# Patient Record
Sex: Male | Born: 1945 | Race: White | Hispanic: No | Marital: Married | Smoking: Former smoker
Health system: Southern US, Community
[De-identification: ages and names within clinical notes are randomized; demographics above are authoritative.]

## PROBLEM LIST (undated history)

## (undated) DIAGNOSIS — G4730 Sleep apnea, unspecified: Secondary | ICD-10-CM

## (undated) DIAGNOSIS — T7840XA Allergy, unspecified, initial encounter: Secondary | ICD-10-CM

## (undated) DIAGNOSIS — G4733 Obstructive sleep apnea (adult) (pediatric): Secondary | ICD-10-CM

## (undated) DIAGNOSIS — I2510 Atherosclerotic heart disease of native coronary artery without angina pectoris: Secondary | ICD-10-CM

## (undated) DIAGNOSIS — E785 Hyperlipidemia, unspecified: Secondary | ICD-10-CM

## (undated) DIAGNOSIS — Z860100 Personal history of colon polyps, unspecified: Secondary | ICD-10-CM

## (undated) DIAGNOSIS — K219 Gastro-esophageal reflux disease without esophagitis: Secondary | ICD-10-CM

## (undated) DIAGNOSIS — K5900 Constipation, unspecified: Secondary | ICD-10-CM

## (undated) DIAGNOSIS — G8929 Other chronic pain: Secondary | ICD-10-CM

## (undated) DIAGNOSIS — R7303 Prediabetes: Secondary | ICD-10-CM

## (undated) DIAGNOSIS — H269 Unspecified cataract: Secondary | ICD-10-CM

## (undated) DIAGNOSIS — I499 Cardiac arrhythmia, unspecified: Secondary | ICD-10-CM

## (undated) DIAGNOSIS — N401 Enlarged prostate with lower urinary tract symptoms: Secondary | ICD-10-CM

## (undated) DIAGNOSIS — K319 Disease of stomach and duodenum, unspecified: Secondary | ICD-10-CM

## (undated) DIAGNOSIS — R011 Cardiac murmur, unspecified: Secondary | ICD-10-CM

## (undated) DIAGNOSIS — D225 Melanocytic nevi of trunk: Secondary | ICD-10-CM

## (undated) DIAGNOSIS — R42 Dizziness and giddiness: Secondary | ICD-10-CM

## (undated) DIAGNOSIS — C801 Malignant (primary) neoplasm, unspecified: Secondary | ICD-10-CM

## (undated) DIAGNOSIS — M1990 Unspecified osteoarthritis, unspecified site: Secondary | ICD-10-CM

## (undated) DIAGNOSIS — I209 Angina pectoris, unspecified: Secondary | ICD-10-CM

## (undated) DIAGNOSIS — Z95810 Presence of automatic (implantable) cardiac defibrillator: Secondary | ICD-10-CM

## (undated) DIAGNOSIS — I10 Essential (primary) hypertension: Secondary | ICD-10-CM

## (undated) DIAGNOSIS — K222 Esophageal obstruction: Secondary | ICD-10-CM

## (undated) DIAGNOSIS — R0602 Shortness of breath: Secondary | ICD-10-CM

## (undated) DIAGNOSIS — N138 Other obstructive and reflux uropathy: Secondary | ICD-10-CM

## (undated) DIAGNOSIS — J189 Pneumonia, unspecified organism: Secondary | ICD-10-CM

## (undated) DIAGNOSIS — K449 Diaphragmatic hernia without obstruction or gangrene: Secondary | ICD-10-CM

## (undated) DIAGNOSIS — I509 Heart failure, unspecified: Secondary | ICD-10-CM

## (undated) DIAGNOSIS — E291 Testicular hypofunction: Secondary | ICD-10-CM

## (undated) DIAGNOSIS — M549 Dorsalgia, unspecified: Secondary | ICD-10-CM

## (undated) DIAGNOSIS — C4491 Basal cell carcinoma of skin, unspecified: Secondary | ICD-10-CM

## (undated) HISTORY — DX: Hyperlipidemia, unspecified: E78.5

## (undated) HISTORY — DX: Atherosclerotic heart disease of native coronary artery without angina pectoris: I25.10

## (undated) HISTORY — DX: Disease of stomach and duodenum, unspecified: K31.9

## (undated) HISTORY — DX: Other obstructive and reflux uropathy: N13.8

## (undated) HISTORY — DX: Personal history of colonic polyps: Z86.010

## (undated) HISTORY — DX: Essential (primary) hypertension: I10

## (undated) HISTORY — DX: Gastro-esophageal reflux disease without esophagitis: K21.9

## (undated) HISTORY — PX: UPPER GASTROINTESTINAL ENDOSCOPY: SHX188

## (undated) HISTORY — PX: OTHER SURGICAL HISTORY: SHX169

## (undated) HISTORY — DX: Diaphragmatic hernia without obstruction or gangrene: K44.9

## (undated) HISTORY — DX: Testicular hypofunction: E29.1

## (undated) HISTORY — PX: POLYPECTOMY: SHX149

## (undated) HISTORY — DX: Cardiac murmur, unspecified: R01.1

## (undated) HISTORY — DX: Obstructive sleep apnea (adult) (pediatric): G47.33

## (undated) HISTORY — DX: Dizziness and giddiness: R42

## (undated) HISTORY — DX: Sleep apnea, unspecified: G47.30

## (undated) HISTORY — PX: CARDIAC CATHETERIZATION: SHX172

## (undated) HISTORY — DX: Heart failure, unspecified: I50.9

## (undated) HISTORY — DX: Benign prostatic hyperplasia with lower urinary tract symptoms: N40.1

## (undated) HISTORY — DX: Unspecified cataract: H26.9

## (undated) HISTORY — DX: Allergy, unspecified, initial encounter: T78.40XA

## (undated) HISTORY — DX: Other chronic pain: G89.29

## (undated) HISTORY — DX: Unspecified osteoarthritis, unspecified site: M19.90

## (undated) HISTORY — PX: KNEE ARTHROPLASTY: SHX992

## (undated) HISTORY — DX: Esophageal obstruction: K22.2

## (undated) HISTORY — DX: Personal history of colon polyps, unspecified: Z86.0100

## (undated) HISTORY — DX: Dorsalgia, unspecified: M54.9

---
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
## 2001-01-03 ENCOUNTER — Encounter: Payer: Self-pay | Admitting: Pulmonary Disease

## 2001-01-03 ENCOUNTER — Encounter: Admission: RE | Admit: 2001-01-03 | Discharge: 2001-01-03 | Payer: Self-pay | Admitting: Pulmonary Disease

## 2001-01-26 ENCOUNTER — Encounter: Admission: RE | Admit: 2001-01-26 | Discharge: 2001-04-26 | Payer: Self-pay | Admitting: Otolaryngology

## 2001-04-10 ENCOUNTER — Encounter: Payer: Self-pay | Admitting: Pulmonary Disease

## 2001-04-10 ENCOUNTER — Ambulatory Visit (HOSPITAL_BASED_OUTPATIENT_CLINIC_OR_DEPARTMENT_OTHER): Admission: RE | Admit: 2001-04-10 | Discharge: 2001-04-10 | Payer: Self-pay | Admitting: Pulmonary Disease

## 2001-11-24 ENCOUNTER — Encounter: Admission: RE | Admit: 2001-11-24 | Discharge: 2001-11-24 | Payer: Self-pay | Admitting: Neurology

## 2001-11-24 ENCOUNTER — Encounter: Payer: Self-pay | Admitting: Neurology

## 2003-04-27 DIAGNOSIS — D225 Melanocytic nevi of trunk: Secondary | ICD-10-CM

## 2003-04-27 HISTORY — DX: Melanocytic nevi of trunk: D22.5

## 2003-05-21 HISTORY — PX: OTHER SURGICAL HISTORY: SHX169

## 2003-08-03 ENCOUNTER — Other Ambulatory Visit: Payer: Self-pay

## 2003-08-11 ENCOUNTER — Other Ambulatory Visit: Payer: Self-pay

## 2003-08-12 ENCOUNTER — Other Ambulatory Visit: Payer: Self-pay

## 2003-08-19 HISTORY — PX: CORONARY ANGIOPLASTY: SHX604

## 2003-09-16 ENCOUNTER — Ambulatory Visit (HOSPITAL_COMMUNITY): Admission: RE | Admit: 2003-09-16 | Discharge: 2003-09-17 | Payer: Self-pay | Admitting: *Deleted

## 2003-10-04 ENCOUNTER — Ambulatory Visit (HOSPITAL_COMMUNITY): Admission: RE | Admit: 2003-10-04 | Discharge: 2003-10-04 | Payer: Self-pay | Admitting: Cardiology

## 2004-06-14 ENCOUNTER — Ambulatory Visit: Payer: Self-pay | Admitting: *Deleted

## 2004-06-25 ENCOUNTER — Ambulatory Visit: Payer: Self-pay | Admitting: *Deleted

## 2004-06-25 ENCOUNTER — Ambulatory Visit: Payer: Self-pay

## 2004-06-28 ENCOUNTER — Ambulatory Visit: Payer: Self-pay | Admitting: *Deleted

## 2004-07-06 ENCOUNTER — Ambulatory Visit: Payer: Self-pay

## 2004-07-13 ENCOUNTER — Ambulatory Visit: Payer: Self-pay | Admitting: *Deleted

## 2004-07-24 ENCOUNTER — Ambulatory Visit: Payer: Self-pay | Admitting: Internal Medicine

## 2004-07-24 ENCOUNTER — Ambulatory Visit (HOSPITAL_COMMUNITY): Admission: RE | Admit: 2004-07-24 | Discharge: 2004-07-24 | Payer: Self-pay | Admitting: *Deleted

## 2004-08-06 ENCOUNTER — Ambulatory Visit: Payer: Self-pay | Admitting: *Deleted

## 2004-08-10 ENCOUNTER — Ambulatory Visit: Payer: Self-pay | Admitting: *Deleted

## 2004-08-15 ENCOUNTER — Ambulatory Visit: Payer: Self-pay | Admitting: *Deleted

## 2004-08-15 ENCOUNTER — Ambulatory Visit (HOSPITAL_COMMUNITY): Admission: RE | Admit: 2004-08-15 | Discharge: 2004-08-15 | Payer: Self-pay | Admitting: *Deleted

## 2004-08-20 ENCOUNTER — Ambulatory Visit: Payer: Self-pay | Admitting: *Deleted

## 2004-09-03 ENCOUNTER — Encounter (HOSPITAL_COMMUNITY): Admission: RE | Admit: 2004-09-03 | Discharge: 2004-12-02 | Payer: Self-pay | Admitting: *Deleted

## 2005-02-05 ENCOUNTER — Ambulatory Visit: Payer: Self-pay | Admitting: Pulmonary Disease

## 2005-02-13 ENCOUNTER — Ambulatory Visit: Payer: Self-pay | Admitting: Cardiology

## 2005-02-18 ENCOUNTER — Ambulatory Visit: Payer: Self-pay | Admitting: Cardiology

## 2005-06-18 ENCOUNTER — Ambulatory Visit: Payer: Self-pay | Admitting: Pulmonary Disease

## 2005-07-29 ENCOUNTER — Ambulatory Visit: Payer: Self-pay | Admitting: Cardiology

## 2006-01-27 ENCOUNTER — Ambulatory Visit: Payer: Self-pay | Admitting: Cardiology

## 2006-02-05 ENCOUNTER — Ambulatory Visit: Payer: Self-pay | Admitting: Cardiology

## 2006-02-13 ENCOUNTER — Ambulatory Visit (HOSPITAL_COMMUNITY): Admission: RE | Admit: 2006-02-13 | Discharge: 2006-02-13 | Payer: Self-pay | Admitting: Orthopedic Surgery

## 2006-07-22 ENCOUNTER — Ambulatory Visit: Payer: Self-pay | Admitting: Cardiology

## 2006-09-25 ENCOUNTER — Ambulatory Visit: Payer: Self-pay | Admitting: Gastroenterology

## 2006-10-27 ENCOUNTER — Ambulatory Visit: Payer: Self-pay | Admitting: Gastroenterology

## 2007-01-12 ENCOUNTER — Encounter (INDEPENDENT_AMBULATORY_CARE_PROVIDER_SITE_OTHER): Payer: Self-pay | Admitting: *Deleted

## 2007-01-22 ENCOUNTER — Ambulatory Visit: Payer: Self-pay | Admitting: Gastroenterology

## 2007-02-06 ENCOUNTER — Ambulatory Visit: Payer: Self-pay | Admitting: Sports Medicine

## 2007-02-06 DIAGNOSIS — I10 Essential (primary) hypertension: Secondary | ICD-10-CM | POA: Insufficient documentation

## 2007-02-06 DIAGNOSIS — I2510 Atherosclerotic heart disease of native coronary artery without angina pectoris: Secondary | ICD-10-CM | POA: Insufficient documentation

## 2007-02-06 DIAGNOSIS — M542 Cervicalgia: Secondary | ICD-10-CM | POA: Insufficient documentation

## 2007-02-06 DIAGNOSIS — M19042 Primary osteoarthritis, left hand: Secondary | ICD-10-CM

## 2007-02-06 DIAGNOSIS — M19041 Primary osteoarthritis, right hand: Secondary | ICD-10-CM | POA: Insufficient documentation

## 2007-02-06 DIAGNOSIS — E785 Hyperlipidemia, unspecified: Secondary | ICD-10-CM | POA: Insufficient documentation

## 2007-02-10 ENCOUNTER — Ambulatory Visit: Payer: Self-pay | Admitting: Cardiology

## 2007-02-12 ENCOUNTER — Ambulatory Visit: Payer: Self-pay | Admitting: Cardiology

## 2007-02-12 LAB — CONVERTED CEMR LAB
ALT: 22 units/L (ref 0–53)
AST: 26 units/L (ref 0–37)
Albumin: 4 g/dL (ref 3.5–5.2)
Bilirubin, Direct: 0.2 mg/dL (ref 0.0–0.3)
Calcium: 9.4 mg/dL (ref 8.4–10.5)
Chloride: 105 meq/L (ref 96–112)
GFR calc Af Amer: 126 mL/min
GFR calc non Af Amer: 104 mL/min
Hgb A1c MFr Bld: 5.7 % (ref 4.6–6.0)
LDL Cholesterol: 66 mg/dL (ref 0–99)
Total CHOL/HDL Ratio: 3.9
VLDL: 16 mg/dL (ref 0–40)

## 2007-04-08 ENCOUNTER — Telehealth (INDEPENDENT_AMBULATORY_CARE_PROVIDER_SITE_OTHER): Payer: Self-pay | Admitting: *Deleted

## 2007-04-10 ENCOUNTER — Ambulatory Visit: Payer: Self-pay | Admitting: Pulmonary Disease

## 2007-05-19 ENCOUNTER — Ambulatory Visit: Payer: Self-pay | Admitting: Pulmonary Disease

## 2007-05-24 DIAGNOSIS — J309 Allergic rhinitis, unspecified: Secondary | ICD-10-CM | POA: Insufficient documentation

## 2007-05-24 DIAGNOSIS — G4733 Obstructive sleep apnea (adult) (pediatric): Secondary | ICD-10-CM | POA: Insufficient documentation

## 2007-05-24 DIAGNOSIS — K222 Esophageal obstruction: Secondary | ICD-10-CM

## 2007-05-24 DIAGNOSIS — D126 Benign neoplasm of colon, unspecified: Secondary | ICD-10-CM

## 2007-05-24 DIAGNOSIS — K449 Diaphragmatic hernia without obstruction or gangrene: Secondary | ICD-10-CM | POA: Insufficient documentation

## 2007-05-24 LAB — CONVERTED CEMR LAB
Alkaline Phosphatase: 58 units/L (ref 39–117)
BUN: 14 mg/dL (ref 6–23)
CO2: 31 meq/L (ref 19–32)
Calcium: 9.2 mg/dL (ref 8.4–10.5)
Eosinophils Absolute: 0.2 10*3/uL (ref 0.0–0.6)
Eosinophils Relative: 4 % (ref 0.0–5.0)
GFR calc Af Amer: 146 mL/min
GFR calc non Af Amer: 121 mL/min
HDL: 26.9 mg/dL — ABNORMAL LOW (ref >39.0)
Ketones, ur: NEGATIVE mg/dL
Leukocytes, UA: NEGATIVE
Lymphocytes Relative: 29.3 % (ref 12.0–46.0)
MCV: 88.9 fL (ref 78.0–100.0)
Monocytes Relative: 13.7 % — ABNORMAL HIGH (ref 3.0–11.0)
Neutro Abs: 2.4 10*3/uL (ref 1.4–7.7)
Platelets: 239 10*3/uL (ref 150–400)
Potassium: 3.7 meq/L (ref 3.5–5.1)
RBC: 4.27 M/uL (ref 4.22–5.81)
Specific Gravity, Urine: 1.02 (ref 1.000–1.03)
Squamous Epithelial / LPF: NEGATIVE /lpf
Total CHOL/HDL Ratio: 3.5
Total Protein: 6.6 g/dL (ref 6.0–8.3)
Triglycerides: 74 mg/dL (ref 0–149)
Urine Glucose: NEGATIVE mg/dL
Urobilinogen, UA: 0.2 (ref 0.0–1.0)
VLDL: 15 mg/dL (ref 0–40)
WBC: 4.5 10*3/uL (ref 4.5–10.5)
pH: 6 (ref 5.0–8.0)

## 2007-05-27 ENCOUNTER — Telehealth: Payer: Self-pay | Admitting: Pulmonary Disease

## 2007-06-02 ENCOUNTER — Telehealth: Payer: Self-pay | Admitting: Pulmonary Disease

## 2007-06-04 ENCOUNTER — Ambulatory Visit: Payer: Self-pay | Admitting: Gastroenterology

## 2007-06-04 LAB — CONVERTED CEMR LAB
A-1 Antitrypsin, Ser: 134 mg/dL (ref 83–200)
Ceruloplasmin: 29 mg/dL (ref 21–63)
Folate: >20 ng/mL
Iron: 69 ug/dL (ref 42–165)
Saturation Ratios: 18.8 % — ABNORMAL LOW (ref 20.0–50.0)
Vitamin B-12: 398 pg/mL (ref 211–911)

## 2007-06-11 ENCOUNTER — Ambulatory Visit (HOSPITAL_COMMUNITY): Admission: RE | Admit: 2007-06-11 | Discharge: 2007-06-11 | Payer: Self-pay | Admitting: Gastroenterology

## 2007-07-01 ENCOUNTER — Telehealth: Payer: Self-pay | Admitting: Pulmonary Disease

## 2007-07-02 ENCOUNTER — Ambulatory Visit: Payer: Self-pay | Admitting: Gastroenterology

## 2007-07-02 LAB — CONVERTED CEMR LAB
ALT: 17 units/L (ref 0–53)
AST: 19 units/L (ref 0–37)
Bilirubin, Direct: 0.2 mg/dL (ref 0.0–0.3)
Cholesterol: 195 mg/dL (ref 0–200)
HDL: 35.2 mg/dL — ABNORMAL LOW (ref >39.0)
LDL Cholesterol: 141 mg/dL — ABNORMAL HIGH (ref 0–99)
Total Protein: 7.1 g/dL (ref 6.0–8.3)
Triglycerides: 93 mg/dL (ref 0–149)

## 2007-07-09 ENCOUNTER — Ambulatory Visit: Payer: Self-pay | Admitting: Gastroenterology

## 2007-08-05 ENCOUNTER — Ambulatory Visit: Payer: Self-pay | Admitting: Gastroenterology

## 2007-08-05 LAB — CONVERTED CEMR LAB: Bilirubin, Direct: 0.2 mg/dL (ref 0.0–0.3)

## 2007-08-12 ENCOUNTER — Telehealth: Payer: Self-pay | Admitting: Pulmonary Disease

## 2007-08-13 ENCOUNTER — Ambulatory Visit: Payer: Self-pay | Admitting: Pulmonary Disease

## 2007-09-14 ENCOUNTER — Ambulatory Visit: Payer: Self-pay | Admitting: Cardiology

## 2007-09-23 ENCOUNTER — Ambulatory Visit: Payer: Self-pay | Admitting: Internal Medicine

## 2007-09-23 ENCOUNTER — Ambulatory Visit (HOSPITAL_COMMUNITY): Admission: RE | Admit: 2007-09-23 | Discharge: 2007-09-23 | Payer: Self-pay | Admitting: Cardiology

## 2007-10-22 ENCOUNTER — Ambulatory Visit: Payer: Self-pay | Admitting: Cardiology

## 2007-11-09 ENCOUNTER — Ambulatory Visit: Payer: Self-pay | Admitting: Cardiology

## 2007-11-09 LAB — CONVERTED CEMR LAB
BUN: 18 mg/dL (ref 6–23)
Basophils Absolute: 0 10*3/uL (ref 0.0–0.1)
Basophils Relative: 0.7 % (ref 0.0–1.0)
CO2: 28 meq/L (ref 19–32)
Chloride: 107 meq/L (ref 96–112)
Creatinine, Ser: 0.9 mg/dL (ref 0.4–1.5)
Eosinophils Relative: 3.7 % (ref 0.0–5.0)
Glucose, Bld: 103 mg/dL — ABNORMAL HIGH (ref 70–99)
Lymphocytes Relative: 27.1 % (ref 12.0–46.0)
Monocytes Relative: 10.7 % (ref 3.0–12.0)
Neutrophils Relative %: 57.8 % (ref 43.0–77.0)
Prothrombin Time: 11.5 s (ref 10.9–13.3)
RBC: 4.03 M/uL — ABNORMAL LOW (ref 4.22–5.81)
WBC: 5.2 10*3/uL (ref 4.5–10.5)
aPTT: 31.4 s — ABNORMAL HIGH (ref 21.7–29.8)

## 2007-11-12 ENCOUNTER — Ambulatory Visit: Payer: Self-pay | Admitting: Cardiovascular Disease

## 2007-11-12 ENCOUNTER — Inpatient Hospital Stay (HOSPITAL_BASED_OUTPATIENT_CLINIC_OR_DEPARTMENT_OTHER): Admission: RE | Admit: 2007-11-12 | Discharge: 2007-11-12 | Payer: Self-pay | Admitting: Cardiovascular Disease

## 2007-11-23 ENCOUNTER — Telehealth: Payer: Self-pay | Admitting: Gastroenterology

## 2007-12-01 ENCOUNTER — Ambulatory Visit: Payer: Self-pay

## 2007-12-01 ENCOUNTER — Encounter: Payer: Self-pay | Admitting: Cardiovascular Disease

## 2007-12-14 ENCOUNTER — Ambulatory Visit: Payer: Self-pay

## 2007-12-19 HISTORY — PX: COLONOSCOPY: SHX174

## 2007-12-23 ENCOUNTER — Ambulatory Visit: Payer: Self-pay | Admitting: Internal Medicine

## 2007-12-23 ENCOUNTER — Ambulatory Visit: Payer: Self-pay | Admitting: Cardiovascular Disease

## 2007-12-23 ENCOUNTER — Telehealth (INDEPENDENT_AMBULATORY_CARE_PROVIDER_SITE_OTHER): Payer: Self-pay | Admitting: *Deleted

## 2007-12-23 ENCOUNTER — Ambulatory Visit: Payer: Self-pay

## 2007-12-23 LAB — CONVERTED CEMR LAB
Albumin: 4.3 g/dL (ref 3.5–5.2)
Bilirubin, Direct: 0.1 mg/dL (ref 0.0–0.3)
Calcium: 9.9 mg/dL (ref 8.4–10.5)
GFR calc Af Amer: 126 mL/min
Glucose, Bld: 111 mg/dL — ABNORMAL HIGH (ref 70–99)
HDL: 35.7 mg/dL — ABNORMAL LOW (ref >39.0)
Sodium: 140 meq/L (ref 135–145)
Total Protein: 7.2 g/dL (ref 6.0–8.3)
VLDL: 26 mg/dL (ref 0–40)

## 2007-12-28 ENCOUNTER — Ambulatory Visit: Payer: Self-pay | Admitting: Gastroenterology

## 2008-01-11 ENCOUNTER — Ambulatory Visit: Payer: Self-pay | Admitting: Gastroenterology

## 2008-01-12 ENCOUNTER — Encounter: Payer: Self-pay | Admitting: Gastroenterology

## 2008-01-26 DIAGNOSIS — C4491 Basal cell carcinoma of skin, unspecified: Secondary | ICD-10-CM

## 2008-01-26 HISTORY — DX: Basal cell carcinoma of skin, unspecified: C44.91

## 2008-02-10 ENCOUNTER — Ambulatory Visit: Payer: Self-pay | Admitting: Cardiovascular Disease

## 2008-05-18 ENCOUNTER — Ambulatory Visit: Payer: Self-pay | Admitting: Cardiovascular Disease

## 2008-05-24 ENCOUNTER — Ambulatory Visit: Payer: Self-pay | Admitting: Cardiovascular Disease

## 2008-05-30 LAB — CONVERTED CEMR LAB
Albumin: 4 g/dL (ref 3.5–5.2)
Alkaline Phosphatase: 47 units/L (ref 39–117)
BUN: 20 mg/dL (ref 6–23)
Calcium: 9.5 mg/dL (ref 8.4–10.5)
Cholesterol: 114 mg/dL (ref 0–200)
Creatinine, Ser: 0.9 mg/dL (ref 0.4–1.5)
GFR calc Af Amer: 109 mL/min
GFR calc non Af Amer: 90 mL/min
Glucose, Bld: 107 mg/dL — ABNORMAL HIGH (ref 70–99)
HDL: 30.6 mg/dL — ABNORMAL LOW (ref >39.0)
LDL Cholesterol: 62 mg/dL (ref 0–99)
Potassium: 4.1 meq/L (ref 3.5–5.1)
Total Protein: 6.6 g/dL (ref 6.0–8.3)
Triglycerides: 109 mg/dL (ref 0–149)
VLDL: 22 mg/dL (ref 0–40)

## 2008-06-07 ENCOUNTER — Telehealth: Payer: Self-pay | Admitting: Pulmonary Disease

## 2008-07-26 ENCOUNTER — Telehealth (INDEPENDENT_AMBULATORY_CARE_PROVIDER_SITE_OTHER): Payer: Self-pay | Admitting: *Deleted

## 2008-07-27 ENCOUNTER — Ambulatory Visit: Payer: Self-pay | Admitting: Pulmonary Disease

## 2008-08-29 ENCOUNTER — Ambulatory Visit: Payer: Self-pay | Admitting: Pulmonary Disease

## 2008-08-31 ENCOUNTER — Encounter: Payer: Self-pay | Admitting: Internal Medicine

## 2008-08-31 ENCOUNTER — Observation Stay (HOSPITAL_COMMUNITY): Admission: EM | Admit: 2008-08-31 | Discharge: 2008-09-01 | Payer: Self-pay | Admitting: Emergency Medicine

## 2008-08-31 ENCOUNTER — Ambulatory Visit: Payer: Self-pay | Admitting: Pulmonary Disease

## 2008-09-01 ENCOUNTER — Telehealth: Payer: Self-pay | Admitting: Pulmonary Disease

## 2008-09-01 ENCOUNTER — Telehealth: Payer: Self-pay | Admitting: Gastroenterology

## 2008-09-01 ENCOUNTER — Encounter: Payer: Self-pay | Admitting: Pulmonary Disease

## 2008-09-02 ENCOUNTER — Ambulatory Visit: Payer: Self-pay | Admitting: Internal Medicine

## 2008-09-02 ENCOUNTER — Encounter: Payer: Self-pay | Admitting: Internal Medicine

## 2008-09-02 ENCOUNTER — Ambulatory Visit: Payer: Self-pay | Admitting: Gastroenterology

## 2008-09-02 ENCOUNTER — Telehealth (INDEPENDENT_AMBULATORY_CARE_PROVIDER_SITE_OTHER): Payer: Self-pay | Admitting: *Deleted

## 2008-09-02 DIAGNOSIS — K219 Gastro-esophageal reflux disease without esophagitis: Secondary | ICD-10-CM | POA: Insufficient documentation

## 2008-09-02 LAB — CONVERTED CEMR LAB
Basophils Absolute: 0 10*3/uL (ref 0.0–0.1)
Eosinophils Relative: 4.5 % (ref 0.0–5.0)
Hemoglobin: 11 g/dL — ABNORMAL LOW (ref 13.0–17.0)
Lymphocytes Relative: 30.9 % (ref 12.0–46.0)
Monocytes Relative: 8.6 % (ref 3.0–12.0)
Neutro Abs: 2.9 10*3/uL (ref 1.4–7.7)
Platelets: 233 10*3/uL (ref 150.0–400.0)
RDW: 12.4 % (ref 11.5–14.6)
WBC: 5 10*3/uL (ref 4.5–10.5)

## 2008-09-05 ENCOUNTER — Telehealth: Payer: Self-pay | Admitting: Internal Medicine

## 2008-09-05 ENCOUNTER — Encounter: Payer: Self-pay | Admitting: Internal Medicine

## 2008-09-07 ENCOUNTER — Ambulatory Visit: Payer: Self-pay | Admitting: Pulmonary Disease

## 2008-09-28 ENCOUNTER — Ambulatory Visit: Payer: Self-pay | Admitting: Gastroenterology

## 2008-10-13 ENCOUNTER — Ambulatory Visit (HOSPITAL_COMMUNITY): Admission: RE | Admit: 2008-10-13 | Discharge: 2008-10-13 | Payer: Self-pay | Admitting: Gastroenterology

## 2008-10-13 ENCOUNTER — Encounter: Payer: Self-pay | Admitting: Gastroenterology

## 2008-10-13 ENCOUNTER — Ambulatory Visit: Payer: Self-pay | Admitting: Gastroenterology

## 2008-10-17 ENCOUNTER — Encounter: Payer: Self-pay | Admitting: Gastroenterology

## 2008-11-14 ENCOUNTER — Telehealth: Payer: Self-pay | Admitting: Gastroenterology

## 2008-11-15 ENCOUNTER — Ambulatory Visit: Payer: Self-pay | Admitting: Gastroenterology

## 2008-11-15 DIAGNOSIS — D135 Benign neoplasm of extrahepatic bile ducts: Secondary | ICD-10-CM

## 2008-11-15 DIAGNOSIS — D134 Benign neoplasm of liver: Secondary | ICD-10-CM

## 2008-11-15 LAB — CONVERTED CEMR LAB
ALT: 22 units/L (ref 0–53)
AST: 25 units/L (ref 0–37)
Albumin: 3.8 g/dL (ref 3.5–5.2)
Alkaline Phosphatase: 60 units/L (ref 39–117)
Amylase: 92 units/L (ref 27–131)
BUN: 14 mg/dL (ref 6–23)
Basophils Absolute: 0 10*3/uL (ref 0.0–0.1)
Basophils Relative: 0.9 % (ref 0.0–3.0)
Bilirubin, Direct: 0.2 mg/dL (ref 0.0–0.3)
CO2: 31 meq/L (ref 19–32)
Calcium: 9.2 mg/dL (ref 8.4–10.5)
Chloride: 107 meq/L (ref 96–112)
Creatinine, Ser: 0.8 mg/dL (ref 0.4–1.5)
Eosinophils Absolute: 0.1 10*3/uL (ref 0.0–0.7)
Eosinophils Relative: 4.1 % (ref 0.0–5.0)
GFR calc non Af Amer: 103.21 mL/min (ref >60)
Glucose, Bld: 99 mg/dL (ref 70–99)
HCT: 30.9 % — ABNORMAL LOW (ref 39.0–52.0)
Hemoglobin: 10.3 g/dL — ABNORMAL LOW (ref 13.0–17.0)
Lipase: 39 units/L (ref 11.0–59.0)
Lymphocytes Relative: 30.9 % (ref 12.0–46.0)
Lymphs Abs: 1.1 10*3/uL (ref 0.7–4.0)
MCHC: 33.2 g/dL (ref 30.0–36.0)
MCV: 86.1 fL (ref 78.0–100.0)
Monocytes Absolute: 0.4 10*3/uL (ref 0.1–1.0)
Monocytes Relative: 12.5 % — ABNORMAL HIGH (ref 3.0–12.0)
Neutro Abs: 1.9 10*3/uL (ref 1.4–7.7)
Neutrophils Relative %: 51.6 % (ref 43.0–77.0)
Platelets: 257 10*3/uL (ref 150.0–400.0)
Potassium: 4 meq/L (ref 3.5–5.1)
RBC: 3.59 M/uL — ABNORMAL LOW (ref 4.22–5.81)
RDW: 12.1 % (ref 11.5–14.6)
Sodium: 143 meq/L (ref 135–145)
TSH: 1.49 microintl units/mL (ref 0.35–5.50)
Total Bilirubin: 1 mg/dL (ref 0.3–1.2)
Total Protein: 6.5 g/dL (ref 6.0–8.3)
WBC: 3.5 10*3/uL — ABNORMAL LOW (ref 4.5–10.5)

## 2008-12-12 ENCOUNTER — Encounter: Payer: Self-pay | Admitting: Adult Health

## 2008-12-13 ENCOUNTER — Encounter (INDEPENDENT_AMBULATORY_CARE_PROVIDER_SITE_OTHER): Payer: Self-pay | Admitting: *Deleted

## 2008-12-16 ENCOUNTER — Ambulatory Visit: Payer: Self-pay | Admitting: Pulmonary Disease

## 2008-12-16 DIAGNOSIS — K319 Disease of stomach and duodenum, unspecified: Secondary | ICD-10-CM

## 2009-01-02 ENCOUNTER — Telehealth: Payer: Self-pay | Admitting: Gastroenterology

## 2009-01-06 ENCOUNTER — Telehealth: Payer: Self-pay | Admitting: Pulmonary Disease

## 2009-01-12 ENCOUNTER — Ambulatory Visit: Payer: Self-pay | Admitting: Cardiovascular Disease

## 2009-03-13 ENCOUNTER — Encounter: Payer: Self-pay | Admitting: Gastroenterology

## 2009-03-15 ENCOUNTER — Telehealth (INDEPENDENT_AMBULATORY_CARE_PROVIDER_SITE_OTHER): Payer: Self-pay | Admitting: *Deleted

## 2009-03-15 ENCOUNTER — Encounter: Payer: Self-pay | Admitting: Gastroenterology

## 2009-03-22 ENCOUNTER — Encounter: Payer: Self-pay | Admitting: Gastroenterology

## 2009-04-19 ENCOUNTER — Encounter (INDEPENDENT_AMBULATORY_CARE_PROVIDER_SITE_OTHER): Payer: Self-pay | Admitting: *Deleted

## 2009-04-20 ENCOUNTER — Ambulatory Visit: Payer: Self-pay | Admitting: Gastroenterology

## 2009-04-21 ENCOUNTER — Telehealth (INDEPENDENT_AMBULATORY_CARE_PROVIDER_SITE_OTHER): Payer: Self-pay | Admitting: *Deleted

## 2009-04-27 ENCOUNTER — Ambulatory Visit (HOSPITAL_COMMUNITY): Admission: RE | Admit: 2009-04-27 | Discharge: 2009-04-27 | Payer: Self-pay | Admitting: Gastroenterology

## 2009-04-27 ENCOUNTER — Ambulatory Visit: Payer: Self-pay | Admitting: Gastroenterology

## 2009-06-14 ENCOUNTER — Telehealth: Payer: Self-pay | Admitting: Pulmonary Disease

## 2009-06-22 ENCOUNTER — Ambulatory Visit: Payer: Self-pay | Admitting: Pulmonary Disease

## 2009-06-23 ENCOUNTER — Telehealth: Payer: Self-pay | Admitting: Pulmonary Disease

## 2009-06-24 LAB — CONVERTED CEMR LAB
ALT: 19 units/L (ref 0–53)
AST: 24 units/L (ref 0–37)
Albumin: 4.1 g/dL (ref 3.5–5.2)
Basophils Absolute: 0 10*3/uL (ref 0.0–0.1)
CO2: 24 meq/L (ref 19–32)
Calcium: 9.4 mg/dL (ref 8.4–10.5)
Cholesterol: 103 mg/dL (ref 0–200)
Creatinine, Ser: 0.9 mg/dL (ref 0.4–1.5)
HCT: 39 % (ref 39.0–52.0)
Hemoglobin: 12.8 g/dL — ABNORMAL LOW (ref 13.0–17.0)
Lymphs Abs: 1.3 10*3/uL (ref 0.7–4.0)
Monocytes Relative: 10.9 % (ref 3.0–12.0)
Neutro Abs: 2.3 10*3/uL (ref 1.4–7.7)
RDW: 13.6 % (ref 11.5–14.6)
TSH: 2.18 microintl units/mL (ref 0.35–5.50)
Total CHOL/HDL Ratio: 3
Total Protein: 7.3 g/dL (ref 6.0–8.3)
Triglycerides: 85 mg/dL (ref 0.0–149.0)

## 2009-08-02 ENCOUNTER — Encounter: Payer: Self-pay | Admitting: Pulmonary Disease

## 2009-09-19 ENCOUNTER — Encounter: Payer: Self-pay | Admitting: Pulmonary Disease

## 2009-12-04 ENCOUNTER — Encounter: Payer: Self-pay | Admitting: Cardiovascular Disease

## 2009-12-04 ENCOUNTER — Encounter: Payer: Self-pay | Admitting: Pulmonary Disease

## 2009-12-19 ENCOUNTER — Ambulatory Visit: Payer: Self-pay | Admitting: Pulmonary Disease

## 2010-01-02 ENCOUNTER — Encounter: Payer: Self-pay | Admitting: Cardiovascular Disease

## 2010-01-02 ENCOUNTER — Encounter: Payer: Self-pay | Admitting: Pulmonary Disease

## 2010-01-04 ENCOUNTER — Ambulatory Visit: Payer: Self-pay | Admitting: Cardiovascular Disease

## 2010-05-07 ENCOUNTER — Encounter: Payer: Self-pay | Admitting: Cardiovascular Disease

## 2010-06-18 ENCOUNTER — Ambulatory Visit
Admission: RE | Admit: 2010-06-18 | Discharge: 2010-06-18 | Payer: Self-pay | Source: Home / Self Care | Attending: Pulmonary Disease | Admitting: Pulmonary Disease

## 2010-06-18 DIAGNOSIS — E291 Testicular hypofunction: Secondary | ICD-10-CM | POA: Insufficient documentation

## 2010-06-18 DIAGNOSIS — N401 Enlarged prostate with lower urinary tract symptoms: Secondary | ICD-10-CM | POA: Insufficient documentation

## 2010-06-18 DIAGNOSIS — N138 Other obstructive and reflux uropathy: Secondary | ICD-10-CM | POA: Insufficient documentation

## 2010-06-19 ENCOUNTER — Telehealth: Payer: Self-pay | Admitting: Pulmonary Disease

## 2010-06-19 NOTE — Letter (Signed)
Summary: [facility]  [facility]   Imported By: Lester Atoka 08/08/2009 09:45:28  _____________________________________________________________________  External Attachment:    Type:   Image     Comment:   External Document

## 2010-06-19 NOTE — Assessment & Plan Note (Signed)
Summary: rov/pt to come in @ 2:00pm/apc   Primary Care Provider:  Alroy Dust, MD  CC:  6 month ROV 7 review of mult medical problems....  History of Present Illness: 65 y/o WM here for a follow up visit... he has multiple medical problems as noted below...     ~  GI Eval:  EGD 09/02/08 by DrPerry showed a HH, schatzki ring, 1cm submucosal nodule in antrum, 2cm sessile polyp in duod;  EUS 10/13/08 by DrJacobs showed sm 7mm subepithelial lesion (?pancr rest, leiomyoma, GIST?) w/ f/u study in 6-12 months recommended... 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   ~  December 16, 2008:  GI eval from [place], Bertell Maria reviewed w/ pt... he is c/o mild sore throat & we discussed MMW for Prn use... he reports f/u by DrTannenbaum this week w/ PSA= 1.3... BP controlled, no chest pains, [facility] wants to change the Benicar to Diovan for $$$ reasons... he's been taking Celebrex daily xyrs and advised to use it just Prn...   ~  June 22, 2009:  generally stable over the last 6 mo- no new complaints or concerns... he gets the [facility], along w/ the similar product from [facility]- "I read too much"... he stopped PPI/ Dexilant Rx in favor of "Vinegar & Honey- it works as well per the [employer]"... had Fasting labs today- everything looks good.   CURRENT PROBLEM LIST:    OBSTRUCTIVE SLEEP APNEA - Hx OSA w/ sleep study 11/02 RDI=18 and desat to 85%; seen by DrClance and on CPAP rx;  he's been using it more regularly, denies daytime hypersomnolence, etc...  ~  CXR 3/10 clear w/ min left basilar scarring...  ~  2/11:  pt requests new CPAP machine w/ humidifier etc...  Episode of HEMOPTYSIS - Hosp 4/10 after episode of BRB hemoptysis> full eval was neg, no recurrence... he had bronchoscopy- neg,   ENT eval DrRosen- neg,  noted some persistant cough treated w/ Mucinex DM, Zyrtek, Tussionex...    HBP &  CAD-S/P stent in RCA on 2005 - followed regularly by DrCooper on ASA 81mg /d,  PLAVIX 75mg /d,  COREG 12.5mg Bid,  DIOVAN 160mg /d...  BP= 140/82 & similar at home... denies HA, fatigue, visual changes, CP, palipit, dizziness, syncope, dyspnea, edema, etc...   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  HYPERLIPIDEMIA - he follows a low fat diet and takes CRESTOR 10mg /d, NIACIN 500mg /d, Fish Oil, Flax Seed Oil, & CoQ10...  ~  FLP 9/08 showed TChol 110, TG 82, HDL 28, LDL 66  ~  FLP 8/09 showed TChol 177, TG 130, HDL 36, LDL 115  ~  FLP 1/10 showed TChol 114, TG 109, HDL 31, LDL 62  ~  FLP 2/11 showed TChol 103, TG 85, HDL 36, LDL 51  HIATAL HERNIA, Hx of ESOPHAGEAL STRICTURE, OTHER SPECIFIED DISORDER OF STOMACH AND DUODENUM (ICD-537.89)>>> prev on Dexilant but he stopped this in favor of "vinegar & honey- rec  by [employer]- it works as well"...  ~  he had an EGD 6/08 showing a 3 cm HH & distal esoph stricture dilated;  Rx Pepcid 20mg /d...  ~  EGD 09/02/08 by DrPerry showed a HH, schatzki ring, 1cm submucosal nodule in antrum, 2cm sessile polyp in duod.  ~  EUS 10/13/08 by DrJacobs showed sm 7mm subepithelial lesion (?pancr rest, leiomyoma, GIST?) w/ f/u study in 6-12 months recommended... 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  Hx of Hyperplastic COLONIC POLYP in 1999 - all followed by DrPatterson;   ~  his last colonoscopy was 8/04 and normal without recurrent polyps...  ~  f/u colonoscopy 8/09 by DrPatterson showed 2mm polyp, bx= hyperplastic, f/u  planned 30yrs.  Hx Elevated LFT's - routine CPx 12/08 w/ abn LFTs- this was felt to be secondary to his Statin therapy... he was told to stop the Statin and f/u labs in one month... he requested a liver evaluation from DrPatterson and he was seen 12/09 at which time DrPatterson rechecked his LFT's and they were all back to normal... he also checked the following:  ~  AbdSonar w/ echodense liver, no lesions or GB problems...  ~  Viral Hepatitis serology was negative...  ~  A1AT level was normal  ~  Ceruloplasmin level was normal  ~  ANA was neg, and Antimitochondrial AB/ Anti Smooth Muscle Ab were neg as well...  ~  Serum Fe was normal & B12/Folate levels were normal as well...  UROLOGY - pt sees DrTannenbaum for routine Urologic checks w/ PSA 7/10 = 1.3  OSTEOARTHRITIS, Hx of BACK PAIN - as noted he feels he must take the celebrex daily as his pain (esp R knee) is much worse when he doesn't take it;  he sees DrOlin and had a RKnee arthroscopy 9/07... he uses Glucosamine, MVI, Vit D & Folic...  ~  7/10:  he requests Rx for Celebrex 100mg  #90- take 1 cap daily as needed (advised just Prn use).  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  HEALTH MAINTENANCE - he is due for PNEUMOVAX age 70 now (2011);  he had his Flu shot 2010;  he had a shingles vaccine as well;  had tetanus shot 2005 w/ trip to [place];  up-to-date on colonoscopy done 2009; prostate check by DrTannenbaum 7/10 w/ PSA= 1.3 per pt...    Allergies: 1)  ! Septra (Sulfamethoxazole-Trimethoprim)  Comments:  Nurse/Medical Assistant: The patient's medications and allergies were reviewed with the patient and were updated in the Medication and Allergy Lists.  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 Family History of CAD Male 1st degree relative age 8 (father) Family History Diabetes 1st degree relative: Father Family History High cholesterol Family History Hypertension Family History of Sudden Death--father at age 70 due to heart disease No FH of Colon Cancer: Family History of Heart Disease: Mother Family History of Stomach Cancer: Grandmother  Social History: Reviewed history from 09/02/2008 and no changes required. Former [employer], previous 30 pack-year hx Alcohol use-no Drug use-no Regular exercise-yes Occupation: Nutritional therapist   Review of Systems      See HPI  The patient denies anorexia, fever, weight loss, weight gain, vision loss, decreased hearing, hoarseness, chest pain, syncope, dyspnea on exertion, peripheral edema, prolonged cough, headaches, hemoptysis, abdominal pain, melena, hematochezia, severe indigestion/heartburn, hematuria, incontinence, muscle weakness, suspicious skin lesions, transient blindness, difficulty walking, depression, unusual weight change, abnormal bleeding, enlarged lymph nodes, and angioedema.    Vital Signs:  Patient profile:   65 year old male Height:      68 inches Weight:      175.25 pounds O2 Sat:      98 % on Room air Temp:     98.9 degrees F oral Pulse rate:   65 / minute BP sitting:   140 / 82  (right arm) Cuff size:   regular  Vitals Entered By: Randell Loop CMA (June 22, 2009 1:58 PM)  O2 Sat at Rest %:  98 O2 Flow:  Room air CC: 6 month ROV 7 review of mult medical problems... Comments MEDS UPDATED TODAY PER PT   Physical Exam  Additional Exam:   WD, WN, 65 y/o WM in NAD... GENERAL:  Alert & oriented; pleasant & cooperative. HEENT:  Davy/AT, EOM-wnl, PERRLA, EACs-clear, TMs-wnl, NOSE-clear, THROAT-clear & wnl. NECK:  Supple w/ fairROM; no JVD; normal carotid impulses w/o bruits; no thyromegaly or nodules palpated; no lymphadenopathy. CHEST:  Clear to P & A; without wheezes/ rales/ or rhonchi. HEART:  Regular Rhythm; without murmurs/ rubs/ or gallops. ABDOMEN:  Soft & nontender; normal bowel sounds; no organomegaly or masses detected. EXT: without deformities, mild arthritic changes; no varicose veins/ venous insuffic/ or edema. NEURO:  CN's intact; motor testing normal; sensory testing normal; gait normal & balance OK. DERM:  No lesions noted; no rash etc...     MISC. Report  Procedure date:  06/22/2009  Findings:      BMP (METABOL)   Sodium                    140 mEq/L                   135-145   Potassium                 4.1 mEq/L                   3.5-5.1   Chloride                  108 mEq/L                   96-112   Carbon Dioxide            24 mEq/L                    19-32   Glucose              [H]  102 mg/dL                   16-10   BUN                       15 mg/dL                    9-60  Creatinine                0.9 mg/dL                   2.4-4.0   Calcium                   9.4 mg/dL                   1.0-27.2   GFR                       89.93 mL/min                >60  Tests: (2) Lipid Panel (LIPID)   Cholesterol               103 mg/dL                   5-366   Triglycerides             85.0 mg/dL                  4.4-034.7   HDL                  [L]  42.59 mg/dL                 >56.38   LDL Cholesterol           51 mg/dL                    7-56  CBC Platelet w/Diff (CBCD)   White Cell Count     [L]  4.1 K/uL                     4.5-10.5   Red Cell Count            4.24 Mil/uL                 4.22-5.81   Hemoglobin           [L]  12.8 g/dL                   43.3-29.5   Hematocrit                39.0 %                      39.0-52.0   MCV                       91.9 fl                     78.0-100.0   Platelet Count            243.0 K/uL                  150.0-400.0   Neutrophil %              52.3 %                      43.0-77.0   Lymphocyte %              32.9 %                      12.0-46.0   Monocyte %  10.9 %                      3.0-12.0   Eosinophils%              2.8 %                       0.0-5.0   Basophils %               1.1 %   Comments:      Hepatic/Liver Function Panel (HEPATIC)   Total Bilirubin      [H]  1.4 mg/dL                   6.6-4.4   Direct Bilirubin          0.3 mg/dL                   0.3-4.7   Alkaline Phosphatase      56 U/L                      39-117   AST                       24 U/L                      0-37   ALT                       19 U/L                      0-53   Total Protein             7.3 g/dL                    4.2-5.9   Albumin                   4.1 g/dL                    5.6-3.8  TSH (TSH)   FastTSH                   2.18 uIU/mL                 0.35-5.50  Prostate Specific Antigen (PSA)   PSA-Hyb                   1.35 ng/mL                  0.10-4.00   Impression & Recommendations:  Problem # 1:  OBSTRUCTIVE SLEEP APNEA (ICD-327.23) He wants a new CPAP machine & we will contact [facility] etc...  Problem # 2:  HYPERTENSION (ICD-401.9) BP controlled-  same meds. His updated medication list for this problem includes:    Coreg 12.5 Mg Tabs (Carvedilol) ..... One tablet by mouth two times a day    Diovan 160 Mg Tabs (Valsartan) .Marland Kitchen... Take 1 tablet by mouth once a day  Problem # 3:  CORONARY ARTERY DISEASE (ICD-414.00) Stable w/o angina... continue f/u w/ DrCooper... His updated medication list for this problem includes:    Adult  Aspirin Ec Low Strength 81 Mg Tbec (Aspirin) .Marland Kitchen... Take 1 tablet by mouth once a day    Plavix 75 Mg Tabs (Clopidogrel bisulfate) .Marland KitchenMarland KitchenMarland KitchenMarland Kitchen  Take 1 tablet by mouth once a day    Coreg 12.5 Mg Tabs (Carvedilol) ..... One tablet by mouth two times a day    Diovan 160 Mg Tabs (Valsartan) .Marland Kitchen... Take 1 tablet by mouth once a day  Problem # 4:  HYPERLIPIDEMIA (ICD-272.4) FLP looks great on meds... continue same. His updated medication list for this problem includes:    Crestor 10 Mg Tabs (Rosuvastatin calcium) .Marland Kitchen... Take 1 tablet by mouth once a day    Slo-niacin 500 Mg Cr-tabs (Niacin) ..... One tablet by mouth once daily  Problem # 5:  GERD (ICD-530.81) He prefers the vinegar & honey per [facility]... I have rec to him to stick w/ the Dexilant daily (esp taking the Celebrex every day) & contact DrPatterson et al for their opinion...  The following medications were removed from the medication list:    Dexilant 60 Mg Cpdr (Dexlansoprazole) .Marland Kitchen... Take one by mouth once daily  Problem # 6:  Hx of COLONIC POLYPS, HYPERPLASTIC (ICD-211.3) He is up to datye & maintains close f/u w/ GI...  Problem # 7:  OSTEOARTHRITIS (ICD-715.90) He notes that he must take the Celebrex daily... but I've advised him to use it just Prn & f/u w/ ortho... His updated medication list for this problem includes:    Adult Aspirin Ec Low Strength 81 Mg Tbec (Aspirin) .Marland Kitchen... Take 1 tablet by mouth once a day    Celebrex 200 Mg Caps (Celecoxib) .Marland Kitchen... Take 1 capsule by mouth once a day  Problem # 8:  OTHER MEDICAL PROBLEMS AS NOTED>>> OK PNEUMOVAX today...  Complete Medication List: 1)  Adult Aspirin Ec Low Strength 81 Mg Tbec (Aspirin) .... Take 1 tablet by mouth once a day 2)  Plavix 75 Mg Tabs (Clopidogrel bisulfate) .... Take 1 tablet by mouth once a day 3)  Coreg 12.5 Mg Tabs (Carvedilol) .... One tablet by mouth two times a day 4)  Diovan 160 Mg Tabs (Valsartan) .... Take 1 tablet by mouth once a day 5)  Crestor 10 Mg  Tabs (Rosuvastatin calcium) .... Take 1 tablet by mouth once a day 6)  Slo-niacin 500 Mg Cr-tabs (Niacin) .... One tablet by mouth once daily 7)  Fish Oil 1000 Mg Caps (Omega-3 fatty acids) .... Take 1 capsule by mouth once a day 8)  Flax Seed Oil 1000 Mg Caps (Flaxseed (linseed)) .... Take 1 capsule by mouth once a day 9)  Co Q10 100 Mg Tabs (Coenzyme q10) .... Take 1 tablet by mouth once daily 10)  Celebrex 200 Mg Caps (Celecoxib) .... Take 1 capsule by mouth once a day 11)  Glucosamine-chondroitin 1500-1200 Mg/69ml Liqd (Glucosamine-chondroitin) .... Take 1 tablet by mouth once a day 12)  Multivitamins Tabs (Multiple vitamin) .... Take 1 tablet by mouth once a day 13)  Vitamin D3 2000 Unit Caps (Cholecalciferol) .... One cap by mouth once daily 14)  Folic Acid 400 Mcg Tabs (Folic acid) .... Take 1 tablet by mouth once a day   Patient Instructions: 1)  Today we updated your med list- see below.... 2)  Continue your current meds the same... 3)  Call for any problems.Marland KitchenMarland Kitchen 4)  Please schedule a follow-up appointment in 6 months.

## 2010-06-19 NOTE — Procedures (Signed)
Summary: [name]  [name]   Imported By: Lennie Odor 10/19/2009 14:35:38  _____________________________________________________________________  External Attachment:    Type:   Image     Comment:   External Document

## 2010-06-19 NOTE — Letter (Signed)
Summary: [facility] Office Visit Note  [facility] Office Visit Note   Imported By: Roderic Ovens 01/23/2010 11:29:19  _____________________________________________________________________  External Attachment:    Type:   Image     Comment:   External Document

## 2010-06-19 NOTE — Letter (Signed)
Summary: [facility] Office Note   [facility] Office Note   Imported By: Roderic Ovens 12/27/2009 12:06:41  _____________________________________________________________________  External Attachment:    Type:   Image     Comment:   External Document

## 2010-06-19 NOTE — Letter (Signed)
Summary: [facility]  [facility]   Imported By: Lester [place] 12/20/2009 10:44:40  _____________________________________________________________________  External Attachment:    Type:   Image     Comment:   External Document

## 2010-06-19 NOTE — Assessment & Plan Note (Signed)
Summary: f1y   Visit Type:  1 year follow up Primary Provider:  Alroy Dust, MD  CC:  Sob.  History of Present Illness: This is a 65 year-old male with CAD and previous coronary stenting. He has undergone follow-up cath showing a patent RCA stent with residual nonobstructive CAD. He has chronic stable exertional dyspnea. He continues with regular exercise and develops dyspnea with moderate-high level exertion. This is unchanged over time. No chest pain or pressure.   Chronic symptoms +palpitations, intermitten lasting minutes +lightheadedness +dizziness  Patient has had chronic fatigue for which he is seeing Dr Marcello Fennel.  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  He was just started on Axiron (testosterone).    Current Medications (verified): 1)  Adult Aspirin Ec Low Strength 81 Mg  Tbec (Aspirin) .... Take 1 Tablet By Mouth Once A Day 2)  Plavix 75 Mg  Tabs (Clopidogrel Bisulfate) .... Take 1 Tablet By Mouth Once A Day 3)  Coreg 12.5 Mg Tabs (Carvedilol) .... One Tablet By Mouth Two Times A Day 4)  Diovan 160 Mg Tabs (Valsartan) .... Take 1 Tablet By Mouth Once A Day 5)  Crestor 10 Mg Tabs (Rosuvastatin Calcium) .... Take 1 Tablet By Mouth Once A Day 6)  Slo-Niacin 500 Mg Cr-Tabs (Niacin) .... One Tablet By Mouth Once Daily 7)  Fish Oil 1000 Mg Caps (Omega-3 Fatty Acids) .... Take 1 Capsule By Mouth Once A Day 8)  Flax Seed Oil 1000 Mg Caps (Flaxseed (Linseed)) .... Take 1 Capsule By Mouth Once A Day 9)  Co Q10 100 Mg Tabs (Coenzyme Q10) .... Take 1 Tablet By Mouth Once Daily 10)  Celebrex 100 Mg Caps (Celecoxib) .... Take 1 Tablet By Mouth Once A Day 11)  Glucosamine-Chondroitin 1500-1200 Mg/28ml  Liqd (Glucosamine-Chondroitin) .... Take 1 Tablet By Mouth Once A Day 12)  Multivitamins   Tabs (Multiple Vitamin) .... Take 1 Tablet By Mouth Once A Day 13)  Vitamin D3 2000 Unit Caps (Cholecalciferol) .... One Cap By Mouth Once Daily 14)  Folic Acid 400 Mcg Tabs (Folic Acid) .... Take 1  Tablet By Mouth Once A Day 15)  Magic Mouthwash .... 1 Tsp Gargle & Swallow Up To 4 Times Daily As Needed... 16)  B Complex  Tabs (B Complex Vitamins) .... Take 1 Tablet By Mouth Once A Day] 17)  Axiron Actuation  Solution 30mg  .... 2 Pumps Daily (Testosterone)  Allergies: 1)  ! Septra (Sulfamethoxazole-Trimethoprim)  Past History:  Past medical history reviewed for relevance to current acute and chronic problems.  Past Medical History: Reviewed history from 12/19/2009 and no changes required. Hx of ALLERGIC RHINITIS (ICD-477.9) OBSTRUCTIVE SLEEP APNEA (ICD-327.23) ASTHMATIC BRONCHITIS, ACUTE (ICD-466.0) Hx of HEMOPTYSIS (ICD-786.3) HYPERTENSION (ICD-401.9) CORONARY ARTERY DISEASE (ICD-414.00) HYPERLIPIDEMIA (ICD-272.4) HIATAL HERNIA (ICD-553.3) GERD (ICD-530.81) Hx of ESOPHAGEAL STRICTURE (ICD-530.3) OTHER SPECIFIED DISORDER OF STOMACH AND DUODENUM (ICD-537.89) Hx of COLONIC POLYPS, HYPERPLASTIC (ICD-211.3) OSTEOARTHRITIS (ICD-715.90) BACK PAIN, CHRONIC, INTERMITTENT (ICD-724.5) Hx of DIZZINESS (ICD-780.4)  Vital Signs:  Patient profile:   65 year old male Height:      68 inches Weight:      169.50 pounds BMI:     25.87 Pulse rate:   70 / minute Pulse rhythm:   irregular Resp:     18 per minute BP sitting:   110 / 70  (left arm) Cuff size:   large  Vitals Entered By: Vikki Ports (January 04, 2010 9:34 AM)  Physical Exam  General:  Pt is alert and oriented, in no acute  distress. HEENT: normal Neck: normal carotid upstrokes without bruits, JVP normal Lungs: CTA CV: RRR without murmur or gallop Abd: soft, NT, positive BS, no bruit, no organomegaly Ext: no clubbing, cyanosis, or edema. peripheral pulses 2+ and equal Skin: warm and dry without rash    EKG  Procedure date:  01/04/2010  Findings:      NSR with 1st degree AVB, LBBB, 70 bpm  Impression & Recommendations:  Problem # 1:  CORONARY ARTERY DISEASE (ICD-414.00) Stable without angina. He is 6 year  out from DES implantation in the RCA. We discussed risks/benefits of continuing dual antiplatelet Rx and made a joint decision to continue at present. He is at low risk of bleeding.  His updated medication list for this problem includes:    Adult Aspirin Ec Low Strength 81 Mg Tbec (Aspirin) .Marland Kitchen... Take 1 tablet by mouth once a day    Plavix 75 Mg Tabs (Clopidogrel bisulfate) .Marland Kitchen... Take 1 tablet by mouth once a day    Coreg 12.5 Mg Tabs (Carvedilol) ..... One tablet by mouth two times a day  Orders: EKG w/ Interpretation (93000)  Problem # 2:  HYPERTENSION (ICD-401.9)  His updated medication list for this problem includes:    Adult Aspirin Ec Low Strength 81 Mg Tbec (Aspirin) .Marland Kitchen... Take 1 tablet by mouth once a day    Coreg 12.5 Mg Tabs (Carvedilol) ..... One tablet by mouth two times a day    Diovan 160 Mg Tabs (Valsartan) .Marland Kitchen... Take 1 tablet by mouth once a day  Orders: EKG w/ Interpretation (93000)  BP today: 110/70 Prior BP: 126/72 (12/19/2009)  Labs Reviewed: K+: 4.1 (06/22/2009) Creat: : 0.9 (06/22/2009)   Chol: 103 (06/22/2009)   HDL: 35.50 (06/22/2009)   LDL: 51 (06/22/2009)   TG: 85.0 (06/22/2009)  Problem # 3:  HYPERLIPIDEMIA (ICD-272.4) Lipids at goal - f/u at time of next office visit. His updated medication list for this problem includes:    Crestor 10 Mg Tabs (Rosuvastatin calcium) .Marland Kitchen... Take 1 tablet by mouth once a day    Slo-niacin 500 Mg Cr-tabs (Niacin) ..... One tablet by mouth once daily  Orders: EKG w/ Interpretation (93000)  CHOL: 103 (06/22/2009)   LDL: 51 (06/22/2009)   HDL: 35.50 (06/22/2009)   TG: 85.0 (06/22/2009)  Patient Instructions: 1)  Your physician recommends that you continue on your current medications as directed. Please refer to the Current Medication list given to you today. 2)  Your physician wants you to follow-up in:  6 MONTHS. You will receive a reminder letter in the mail two months in advance. If you don't receive a letter, please  call our office to schedule the follow-up appointment.

## 2010-06-19 NOTE — Letter (Signed)
Summary: [facility]   Imported By: Sherian Rein 01/11/2010 10:07:05  _____________________________________________________________________  External Attachment:    Type:   Image     Comment:   External Document

## 2010-06-19 NOTE — Assessment & Plan Note (Signed)
Summary: 6 months/apc   Primary Care Provider:  Alroy Dust, MD  CC:  6 month ROV & review of mult medical problems....  History of Present Illness: 65 y/o WM here for a follow up visit... he has multiple medical problems as noted below...     ~  December 16, 2008:  GI eval from [place], Bertell Maria reviewed w/ pt... he is c/o mild sore throat & we discussed MMW for Prn use... he reports f/u by DrTannenbaum this week w/ PSA= 1.3... BP controlled, no chest pains, [facility] wants to change the Benicar to Diovan for $$$ reasons... he's been taking Celebrex daily xyrs and advised to use it just Prn...   ~  June 22, 2009:  generally stable over the last 6 mo- no new complaints or concerns... he gets the [facility], along w/ the similar product from [facility]- "I read too much"... he stopped PPI/ Dexilant Rx in favor of "Vinegar & Honey- it works as well per the [employer]"... had Fasting labs today- everything looks good.   ~  December 20, 2009:  generally stable overall- notes some nocturnal CTS symptoms but doesn't want to use wrist splints; exercising daily w/ treadmill, etc- notes tired & SOB after 45 min work out or if taking stairs... OSA stable on CPAP; BP controlled on meds & denies angina, palpit, etc; Lipids stable on Cres10, Niacin, FishOil; denies any GI concerns at present; saw DrTannenbaum recently w/ PSAreported at 1.3 but found "Low-T" & planning more tests; still taking Celebrex regularly for his DJD; requests refill MMW for Prn use...    CURRENT PROBLEM LIST:    OBSTRUCTIVE SLEEP APNEA - Hx OSA w/ sleep study 11/02 RDI=18 and desat to 85%; seen by DrClance and on CPAP rx;  he's been using it more regularly, denies daytime hypersomnolence, etc...  ~  CXR 3/10 clear w/ min left basilar scarring...  ~  2/11:  pt requests new CPAP machine w/ humidifier etc...  Episode of HEMOPTYSIS - Hosp 4/10 after episode of BRB hemoptysis> full eval was neg, no recurrence...  he had bronchoscopy- neg,   ENT eval DrRosen- neg,  noted some persistant cough treated w/ Mucinex DM, Zyrtek, Tussionex...    HBP & CAD-S/P stent in RCA on 2005 - followed regularly by DrCooper on ASA 81mg /d,  PLAVIX 75mg /d,  COREG 12.5mg Bid,  DIOVAN 160mg /d...  BP= 126/72 & similar at home... denies HA, fatigue, visual changes, CP, palipit, dizziness, syncope, dyspnea, edema, etc...   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  HYPERLIPIDEMIA - he follows a low fat diet and takes CRESTOR 10mg /d, NIACIN 500mg /d, Fish Oil, Flax Seed Oil, & CoQ10...  ~  FLP 9/08 showed TChol 110, TG 82, HDL 28, LDL 66  ~  FLP 8/09 showed TChol 177, TG 130, HDL 36, LDL 115  ~  FLP 1/10 showed TChol 114, TG 109, HDL 31, LDL 62  ~  FLP 2/11 showed TChol 103, TG 85, HDL 36, LDL 51  HIATAL HERNIA, Hx of ESOPHAGEAL STRICTURE, OTHER SPECIFIED DISORDER OF STOMACH AND DUODENUM (ICD-537.89)>>> prev on Dexilant but he stopped this in favor of "vinegar & honey- rec by [employer]- it works as well"...  ~  he had an EGD 6/08 showing a 3 cm HH & distal esoph stricture dilated;  Rx Pepcid 20mg /d...  ~  EGD 09/02/08 by DrPerry showed a HH, schatzki ring, 1cm submucosal nodule in antrum, 2cm sessile polyp in duod.  ~  EUS 10/13/08 by DrJacobs showed sm 7mm subepithelial lesion (?pancr rest, leiomyoma, GIST?) w/ f/u study in 6-12 months recommended... 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  ~  f/u DrPatterson 6/10- continue KAPIDEX & reflux regimen...  Hx of Hyperplastic COLONIC POLYP in  1999 - all followed by DrPatterson;   ~  his last colonoscopy was 8/04 and normal without recurrent polyps...  ~  f/u colonoscopy 8/09 by DrPatterson showed 2mm polyp, bx= hyperplastic, f/u planned 65yrs.  Hx Elevated LFT's - routine CPx 12/08 w/ abn LFTs- this was felt to be secondary to his Statin therapy... he was told to stop the Statin and f/u labs in one month... he requested a liver evaluation from DrPatterson and he was seen 12/09 at which time DrPatterson rechecked his LFT's and they were all back to normal... he also checked the following:  ~  AbdSonar w/ echodense liver, no lesions or GB problems...  ~  Viral Hepatitis serology was negative...  ~  A1AT level was normal  ~  Ceruloplasmin level was normal  ~  ANA was neg, and Antimitochondrial AB/ Anti Smooth Muscle Ab were neg as well...  ~  Serum Fe was normal & B12/Folate levels were normal as well...  UROLOGY - pt sees DrTannenbaum for routine Urologic checks w/ PSA 7/10 = 1.3  ~  8/11: he reports that recent Urologic check showed PSA=1.3 but he has "Low-T" & DrTannenbaum plans more tests.  OSTEOARTHRITIS, Hx of BACK PAIN - as noted he feels he must take the celebrex daily as his pain (esp R knee) is much worse when he doesn't take it;  he sees DrOlin and had a RKnee arthroscopy 9/07... he uses Glucosamine, MVI, Vit D & Folic...  ~  7/10:  he requests Rx for Celebrex 100mg  #90- take 1 cap daily as needed (advised just Prn use).  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  HEALTH MAINTENANCE - he is due for PNEUMOVAX age 58 now (2011);  he had his Flu shot 2010;  he had a shingles vaccine as well;  had tetanus shot 2005 w/ trip to [place];  up-to-date on colonoscopy done 2009; prostate check by DrTannenbaum 7/10 w/ PSA= 1.3 per pt...   Preventive Screening-Counseling & Management  Alcohol-Tobacco     Smoking Status: quit     Year Quit:  1992  Caffeine-Diet-Exercise     Does Patient Exercise: yes  Allergies: 1)  ! Septra (Sulfamethoxazole-Trimethoprim)  Comments:  Nurse/Medical Assistant: The patient's medications and allergies were reviewed with the patient and were updated in the Medication and Allergy Lists.  Past History:  Past Medical History: Hx of ALLERGIC RHINITIS (ICD-477.9) OBSTRUCTIVE SLEEP APNEA (ICD-327.23) ASTHMATIC BRONCHITIS, ACUTE (ICD-466.0) Hx of HEMOPTYSIS (ICD-786.3) HYPERTENSION (ICD-401.9) CORONARY ARTERY DISEASE (ICD-414.00) HYPERLIPIDEMIA (ICD-272.4) HIATAL HERNIA (ICD-553.3) GERD (ICD-530.81) Hx of ESOPHAGEAL STRICTURE (ICD-530.3) OTHER SPECIFIED DISORDER OF STOMACH AND DUODENUM (ICD-537.89) Hx of COLONIC POLYPS, HYPERPLASTIC (ICD-211.3) OSTEOARTHRITIS (ICD-715.90) BACK PAIN, CHRONIC,  INTERMITTENT (ICD-724.5) Hx of DIZZINESS (ICD-780.4)  Past Surgical History: s/p R knee arthroscopy 2005 s/p RCA stent 08/2003 hernia surgery x 3  Family History: Reviewed history from 11/15/2008 and no changes required. Family History of CAD Male 1st degree relative age 53 (father) Family History Diabetes 1st degree relative: Father Family History High cholesterol Family History Hypertension Family History of Sudden Death--father at age 101 due to heart disease No FH of Colon Cancer: Family History of Heart Disease: Mother Family History of Stomach Cancer: Grandmother  Social History: Reviewed history from 09/02/2008 and no changes required. Former [employer], previous 30 pack-year hx Alcohol use-no Drug use-no Regular exercise-yes Occupation: Nutritional therapist   Review of Systems      See HPI       The patient complains of dyspnea on exertion.  The patient denies anorexia, fever, weight loss, weight gain, vision loss, decreased hearing, hoarseness, chest pain, syncope, peripheral edema, prolonged cough, headaches, hemoptysis, abdominal pain, melena, hematochezia, severe  indigestion/heartburn, hematuria, incontinence, muscle weakness, suspicious skin lesions, transient blindness, difficulty walking, depression, unusual weight change, abnormal bleeding, enlarged lymph nodes, and angioedema.    Vital Signs:  Patient profile:   65 year old male Height:      68 inches Weight:      172.38 pounds BMI:     26.31 O2 Sat:      97 % on Room air Temp:     98.4 degrees F oral Pulse rate:   70 / minute BP sitting:   126 / 72  (right arm) Cuff size:   regular  Vitals Entered By: Randell Loop CMA (December 19, 2009 2:15 PM)  O2 Sat at Rest %:  97 O2 Flow:  Room air CC: 6 month ROV & review of mult medical problems... Is Patient Diabetic? No Pain Assessment Patient in pain? no      Comments meds updated today with pt   Physical Exam  Additional Exam:   WD, WN, 65 y/o WM in NAD... GENERAL:  Alert & oriented; pleasant & cooperative. HEENT:  [place]/AT, EOM-wnl, PERRLA, EACs-clear, TMs-wnl, NOSE-clear, THROAT-clear & wnl. NECK:  Supple w/ fairROM; no JVD; normal carotid impulses w/o bruits; no thyromegaly or nodules palpated; no lymphadenopathy. CHEST:  Clear to P & A; without wheezes/ rales/ or rhonchi. HEART:  Regular Rhythm; without murmurs/ rubs/ or gallops. ABDOMEN:  Soft & nontender; normal bowel sounds; no organomegaly or masses detected. EXT: without deformities, mild arthritic changes; no varicose veins/ venous insuffic/ or edema. NEURO:  CN's intact; motor testing normal; sensory testing normal; gait normal & balance OK. DERM:  No lesions noted; no rash etc...    Impression & Recommendations:  Problem # 1:  OBSTRUCTIVE SLEEP APNEA (ICD-327.23) Stable on CPAP...  Problem # 2:  HYPERTENSION (ICD-401.9) BP controlled on meds>  continue same. His updated medication list for this problem includes:    Coreg 12.5 Mg Tabs (Carvedilol) ..... One tablet by mouth two times a day    Diovan 160 Mg Tabs (Valsartan) .Marland Kitchen... Take 1 tablet by mouth once a  day  Problem # 3:  CORONARY ARTERY DISEASE (ICD-414.00) No angina, exercising regularly, appears stable, continue same meds. His updated medication list for this problem includes:    Adult Aspirin Ec Low Strength 81 Mg Tbec (Aspirin) .Marland Kitchen... Take 1 tablet by mouth once a day    Plavix 75 Mg Tabs (Clopidogrel bisulfate) .Marland Kitchen... Take 1 tablet by mouth once a day    Coreg 12.5 Mg Tabs (  Carvedilol) ..... One tablet by mouth two times a day    Diovan 160 Mg Tabs (Valsartan) .Marland Kitchen... Take 1 tablet by mouth once a day  Problem # 4:  HYPERLIPIDEMIA (ICD-272.4) Stable on diet + meds... His updated medication list for this problem includes:    Crestor 10 Mg Tabs (Rosuvastatin calcium) .Marland Kitchen... Take 1 tablet by mouth once a day    Slo-niacin 500 Mg Cr-tabs (Niacin) ..... One tablet by mouth once daily  Problem # 5:  GERD (ICD-530.81) GI appears stable, no active complaints and he declines PPI in favor of vinegar & honey per the [employer]...  Problem # 6:  OSTEOARTHRITIS (ICD-715.90) This is his CC> controlled & tolerable on the Celebrex which he continues to take daily... discussed decr to just Prn use & drug holiday periods... His updated medication list for this problem includes:    Adult Aspirin Ec Low Strength 81 Mg Tbec (Aspirin) .Marland Kitchen... Take 1 tablet by mouth once a day    Celebrex 100 Mg Caps (Celecoxib) .Marland Kitchen... Take 1 tablet by mouth once a day  Problem # 7:  UROLOGY>>> Per DrTannenbaum...  Problem # 8:  OTHER MEDICAL PROBLEMS AS NOTED>>>  Complete Medication List: 1)  Adult Aspirin Ec Low Strength 81 Mg Tbec (Aspirin) .... Take 1 tablet by mouth once a day 2)  Plavix 75 Mg Tabs (Clopidogrel bisulfate) .... Take 1 tablet by mouth once a day 3)  Coreg 12.5 Mg Tabs (Carvedilol) .... One tablet by mouth two times a day 4)  Diovan 160 Mg Tabs (Valsartan) .... Take 1 tablet by mouth once a day 5)  Crestor 10 Mg Tabs (Rosuvastatin calcium) .... Take 1 tablet by mouth once a day 6)  Slo-niacin  500 Mg Cr-tabs (Niacin) .... One tablet by mouth once daily 7)  Fish Oil 1000 Mg Caps (Omega-3 fatty acids) .... Take 1 capsule by mouth once a day 8)  Flax Seed Oil 1000 Mg Caps (Flaxseed (linseed)) .... Take 1 capsule by mouth once a day 9)  Co Q10 100 Mg Tabs (Coenzyme q10) .... Take 1 tablet by mouth once daily 10)  Celebrex 100 Mg Caps (Celecoxib) .... Take 1 tablet by mouth once a day 11)  Glucosamine-chondroitin 1500-1200 Mg/74ml Liqd (Glucosamine-chondroitin) .... Take 1 tablet by mouth once a day 12)  Multivitamins Tabs (Multiple vitamin) .... Take 1 tablet by mouth once a day 13)  Vitamin D3 2000 Unit Caps (Cholecalciferol) .... One cap by mouth once daily 14)  Folic Acid 400 Mcg Tabs (Folic acid) .... Take 1 tablet by mouth once a day 15)  Magic Mouthwash  .... 1 tsp gargle & swallow up to 4 times daily as needed...  Patient Instructions: 1)  Today we updated your med list- see below.... 2)  Continue your current meds the same... 3)  We wrote a new perscription for Magic Mouthwash for as needed use.Marland KitchenMarland Kitchen 4)  Call for any problems.Marland KitchenMarland Kitchen 5)  Please schedule a follow-up appointment in 6 months & we will plan FASTING blood work at that time... Prescriptions: MAGIC MOUTHWASH 1 tsp gargle & swallow up to 4 times daily as needed...  #4 oz x prn   Entered and Authorized by:   Michele Mcalpine MD   Signed by:   Michele Mcalpine MD on 12/19/2009   Method used:   Print then Give to Patient   RxID:   [id number]    Immunization History:  Influenza Immunization History:    Influenza:  historical (  04/06/2009)  

## 2010-06-19 NOTE — Progress Notes (Signed)
Summary: labs  Phone Note Call from Patient Call back at Home Phone [phone]   Caller: Patient Call For: nadels Reason for Call: Talk to Nurse Summary of Call: need labs put in for pt to come in Thursday Feb 3 early for Blood work. Initial call taken by: Eugene Gavia,  June 14, 2009 1:31 PM  Follow-up for Phone Call        will forward to [facility], please advise. Thanks! Gweneth Dimitri RN  June 14, 2009 1:33 PM    labs in computer for pt to come in on 2-3.  pt is aware Randell Loop CMA  June 14, 2009 3:14 PM

## 2010-06-19 NOTE — Progress Notes (Signed)
Summary: prescript  Phone Note Call from Patient   Caller: Patient Call For: Sharad Vaneaton Summary of Call: calling about prescript for c pap machine Initial call taken by: Rickard Patience,  June 23, 2009 11:01 AM  Follow-up for Phone Call        pt states he talked with SN about a new cpap machine, mask and supplies at appt yesterday, but then forgot to follow-up to remind SN to send order. Please advise if ok to place order for this? Pt states cpap was rx by a MD he saw years ago in another town, could not remember the name. Please advise.Carron Curie CMA  June 23, 2009 11:51 AM   Additional Follow-up for Phone Call Additional follow up Details #1::        order has been sent for pt to get new cpap machine. Randell Loop CMA  June 23, 2009 12:49 PM

## 2010-06-20 ENCOUNTER — Other Ambulatory Visit: Payer: Self-pay

## 2010-06-21 ENCOUNTER — Other Ambulatory Visit: Payer: Self-pay

## 2010-06-21 ENCOUNTER — Other Ambulatory Visit: Payer: Self-pay | Admitting: Pulmonary Disease

## 2010-06-21 ENCOUNTER — Encounter (INDEPENDENT_AMBULATORY_CARE_PROVIDER_SITE_OTHER): Payer: Self-pay | Admitting: *Deleted

## 2010-06-21 LAB — URINALYSIS
Hgb urine dipstick: NEGATIVE
Ketones, ur: NEGATIVE
Urine Glucose: NEGATIVE
Urobilinogen, UA: 0.2 (ref 0.0–1.0)

## 2010-06-21 LAB — BASIC METABOLIC PANEL
BUN: 17 mg/dL (ref 6–23)
Creatinine, Ser: 0.9 mg/dL (ref 0.4–1.5)
GFR: 85.26 mL/min (ref >60.00)
Potassium: 4.3 mEq/L (ref 3.5–5.1)

## 2010-06-21 LAB — HEPATIC FUNCTION PANEL
ALT: 19 U/L (ref 0–53)
AST: 23 U/L (ref 0–37)
Bilirubin, Direct: 0.2 mg/dL (ref 0.0–0.3)
Total Bilirubin: 1.4 mg/dL — ABNORMAL HIGH (ref 0.3–1.2)

## 2010-06-21 LAB — CBC WITH DIFFERENTIAL/PLATELET
Eosinophils Relative: 4.2 % (ref 0.0–5.0)
Monocytes Relative: 11.2 % (ref 3.0–12.0)
Neutrophils Relative %: 58.2 % (ref 43.0–77.0)
Platelets: 228 10*3/uL (ref 150.0–400.0)
WBC: 4.3 10*3/uL — ABNORMAL LOW (ref 4.5–10.5)

## 2010-06-21 LAB — LIPID PANEL
Cholesterol: 101 mg/dL (ref 0–200)
LDL Cholesterol: 51 mg/dL (ref 0–99)
Triglycerides: 121 mg/dL (ref 0.0–149.0)
VLDL: 24.2 mg/dL (ref 0.0–40.0)

## 2010-06-21 NOTE — Letter (Signed)
Summary: [facility] Office Visit Note   [facility] Office Visit Note   Imported By: Roderic Ovens 05/23/2010 16:03:08  _____________________________________________________________________  External Attachment:    Type:   Image     Comment:   External Document

## 2010-06-22 ENCOUNTER — Ambulatory Visit: Payer: Self-pay | Admitting: Gastroenterology

## 2010-06-25 ENCOUNTER — Telehealth (INDEPENDENT_AMBULATORY_CARE_PROVIDER_SITE_OTHER): Payer: Self-pay | Admitting: *Deleted

## 2010-06-25 ENCOUNTER — Telehealth: Payer: Self-pay | Admitting: Pulmonary Disease

## 2010-06-26 ENCOUNTER — Encounter: Payer: Self-pay | Admitting: Cardiovascular Disease

## 2010-06-26 ENCOUNTER — Ambulatory Visit (INDEPENDENT_AMBULATORY_CARE_PROVIDER_SITE_OTHER): Payer: Medicare Other | Admitting: Cardiovascular Disease

## 2010-06-26 DIAGNOSIS — I2510 Atherosclerotic heart disease of native coronary artery without angina pectoris: Secondary | ICD-10-CM

## 2010-06-27 NOTE — Assessment & Plan Note (Signed)
Summary: 6 months/apc   Primary Care Provider:  Alroy Dust, MD  CC:  6 month ROV & review of mult medical problems....  History of Present Illness: 65 y/o WM here for a follow up visit... he has multiple medical problems as noted below...     ~  December 20, 2009:  generally stable overall- notes some nocturnal CTS symptoms but doesn't want to use wrist splints; exercising daily w/ treadmill, etc- notes tired & SOB after 45 min work out or if taking stairs... OSA stable on CPAP; BP controlled on meds & denies angina, palpit, etc; Lipids stable on Cres10, Niacin, FishOil; denies any GI concerns at present; saw DrTannenbaum recently w/ PSA reported at 1.3 but found "Low-T" & planning more tests; still taking Celebrex regularly for his DJD; requests refill MMW for Prn use...    ~  June 18, 2010:  he's had a good 34mo> still c/o bilat CTS symtpms in the AM & he will try the wrist splints Qhs...  he was started on AXIRON topical testosterone Rx per DrTannenbaum & improved... he saw DrCooper 8/11 & stable from the CV standpoint- no changes made... he was sent to [facility] by his dentist for ?sinus ?LN in neck- nothing pathological found & told to watch it (exam today= neg no node palp)... he will ret for Fasting blood work...    CURRENT PROBLEM LIST:    OBSTRUCTIVE SLEEP APNEA - Hx OSA w/ sleep study 11/02 RDI=18 and desat to 85%; seen by DrClance and on CPAP rx;  he's been using it more regularly, denies daytime hypersomnolence, etc...  ~  CXR 3/10 clear w/ min left basilar scarring...  ~  2/11:  pt requests new CPAP machine w/ humidifier etc...  Episode of HEMOPTYSIS - Hosp 4/10 after episode of BRB hemoptysis> full eval was neg, no recurrence... he had bronchoscopy- neg,  ENT eval DrRosen- neg, noted some persistant cough treated w/ Mucinex DM, Zyrtek, Tussionex...  no recurrence.  HBP & CAD-S/P stent in RCA on 2005 - followed regularly by DrCooper on ASA 81mg /d,  PLAVIX 75mg /d,  COREG  12.5mg Bid,  DIOVAN 160mg /d...  BP= 132/70 & similar at home... denies HA, fatigue, visual changes, CP, palipit, dizziness, syncope, dyspnea, edema, etc...   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  ~  Last OV by DrCooper 8/11 reviewed> HBP, CAD w/ stent, no changes made.  HYPERLIPIDEMIA - he follows a low fat diet and takes CRESTOR 10mg /d, NIACIN 500mg /d, Fish Oil, Flax Seed Oil, & CoQ10...  ~  FLP 9/08 showed TChol 110, TG 82, HDL 28, LDL 66  ~  FLP 8/09 showed TChol 177, TG 130, HDL 36, LDL 115  ~  FLP 1/10 showed TChol 114, TG 109, HDL 31, LDL 62  ~  FLP 2/11 showed TChol 103, TG 85, HDL 36, LDL 51  ~  FLP 2/12 showed TChol 101, TG 121, HDL 26, LDL 51  HIATAL HERNIA, Hx of ESOPHAGEAL STRICTURE, OTHER SPECIFIED DISORDER OF STOMACH AND DUODENUM (ICD-537.89)>>>  he gets the [facility], along w/ the similar product from [employer]- "I read too much"- he stopped PPI/ Dexilant Rx in favor of "Vinegar & Honey- it works as well per the [employer]"...   ~  he had an EGD 6/08 showing a 3 cm HH & distal esoph stricture dilated;  Rx Pepcid 20mg /d...  ~  EGD 09/02/08 by DrPerry showed a HH, schatzki ring, 1cm submucosal nodule in antrum, 2cm sessile polyp in duod.  ~  EUS 10/13/08 by DrJacobs showed sm 7mm subepithelial lesion (?pancr rest, leiomyoma, GIST?) w/ f/u study in 6-12 months recommended... 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  ~  f/u DrPatterson 6/10- continue KAPIDEX & reflux regimen...  ~  1/12:  he is overdue for f/u w/ GI  & we will request appt.  Hx of Hyperplastic COLONIC POLYP in 1999 - all followed by DrPatterson;   ~  his last colonoscopy was 8/04 and normal without recurrent polyps...  ~  f/u colonoscopy 8/09 by DrPatterson showed 2mm polyp, bx= hyperplastic, f/u planned 6yrs.  Hx Elevated LFT's - routine CPX 12/08 w/ abn LFTs- this was felt to be secondary to his Statin therapy... he was told to stop the Statin and f/u labs in one month... he requested a liver evaluation from DrPatterson and he was seen 12/09 at which time DrPatterson rechecked his LFT's and they were all back to normal... he also checked the following:  ~  AbdSonar w/ echodense liver, no lesions or GB problems...  ~  Viral Hepatitis serology was negative...  ~  A1AT level was normal  ~  Ceruloplasmin level was normal  ~  ANA was neg, and Antimitochondrial AB/ Anti Smooth Muscle Ab were neg as well...  ~  Serum Fe was normal & B12/Folate levels were normal as well...  ~  2/12 NOTE:  LFTs have remained WNL & he is on Cres10.  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  ~  1/12: he reports Rx by DrTannenbaum w/ AXIRON 30mg  per actuation- use under each arm daily; Testos level 12/11= 575 on Rx.  OSTEOARTHRITIS, Hx of BACK PAIN - as noted he feels he must take the celebrex daily as his pain (esp R knee) is much worse when he doesn't take it;  he sees DrOlin and had a RKnee arthroscopy 9/07... he uses Glucosamine, MVI, Vit D & Folic...  ~  7/10:  he requests Rx for Celebrex 100mg  #90- take 1 cap daily as needed (advised just Prn use).  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  HEALTH MAINTENANCE - he is due for PNEUMOVAX age 72 now  (2011);  he had his Flu shot 2010;  he had a shingles vaccine as well;  had tetanus shot 2005 w/ trip to [place];  up-to-date on colonoscopy done 2009; prostate check by DrTannenbaum 7/10 w/ PSA= 1.3 per pt...   Preventive Screening-Counseling & Management  Alcohol-Tobacco     Smoking Status: quit     Year Quit: 1992  Caffeine-Diet-Exercise     Does Patient Exercise: yes  Allergies: 1)  ! Septra  Comments:  Nurse/Medical Assistant: The patient's medications and allergies were reviewed  with the patient and were updated in the Medication and Allergy Lists.  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 hernia surgery x 3  Family History: Reviewed history from 12/29/2009 and no changes required. Family History of CAD Male 1st degree relative age 37 (father) Family History Diabetes 1st degree relative: Father Family History High cholesterol Family History Hypertension Family History of Sudden Death--father at age 14 due to heart disease No FH of Colon Cancer: Family History of Heart Disease: Mother Family History of Stomach Cancer: Grandmother  Social History: Reviewed history from 12/29/2009 and no changes  required. Former [employer], previous 30 pack-year hx Alcohol use-no Drug use-no Regular exercise-yes Occupation: Nutritional therapist   Review of Systems      See HPI       The patient complains of dyspnea on exertion.  The patient denies anorexia, fever, weight loss, weight gain, vision loss, decreased hearing, hoarseness, chest pain, syncope, peripheral edema, prolonged cough, headaches, hemoptysis, abdominal pain, melena, hematochezia, severe indigestion/heartburn, hematuria, incontinence, muscle weakness, suspicious skin lesions, transient blindness, difficulty walking, depression, unusual weight change, abnormal bleeding, enlarged lymph nodes, and angioedema.    Vital Signs:  Patient profile:   65 year old male Height:      68 inches Weight:      183 pounds BMI:     27.93 O2 Sat:      98 % on Room air Temp:     98.9 degrees F oral Pulse rate:   67 / minute BP sitting:   132 / 70  (left arm) Cuff size:   regular  Vitals Entered By: Randell Loop CMA (June 18, 2010 2:25 PM)  O2 Sat at Rest %:  98 O2 Flow:  Room air CC: 6 month ROV & review of mult medical problems... Is Patient Diabetic? No Pain Assessment Patient in pain? no      Comments meds updated today with pt   Physical Exam  Additional Exam:   WD, WN, 65 y/o WM in NAD... GENERAL:  Alert & oriented; pleasant & cooperative. HEENT:  [place]/AT, EOM-wnl, PERRLA, EACs-clear, TMs-wnl, NOSE-clear, THROAT-clear & wnl. NECK:  Supple w/ fairROM; no JVD; normal carotid impulses w/o bruits; no thyromegaly or nodules palpated; no lymphadenopathy. CHEST:  Clear to P & A; without wheezes/ rales/ or rhonchi. HEART:  Regular Rhythm; without murmurs/ rubs/ or gallops. ABDOMEN:  Soft & nontender; normal bowel sounds; no organomegaly or masses detected. EXT: without deformities, mild arthritic changes; no varicose veins/ venous insuffic/ or edema. NEURO:  CN's intact; motor testing normal; sensory testing normal; gait normal  & balance OK. DERM:  No lesions noted; no rash etc...    MISC. Report  Procedure date:  06/18/2010  Findings:      He will return for FASTING blood work this week & we will notify him of the results...  SN   Impression & Recommendations:  Problem # 1:  OBSTRUCTIVE SLEEP  APNEA (ICD-327.23) Stable on CPAP>  continue same...  Problem # 2:  HYPERTENSION (ICD-401.9) Controlled on meds>  continue same. His updated medication list for this problem includes:    Coreg 12.5 Mg Tabs (Carvedilol) ..... One tablet by mouth two times a day    Diovan 160 Mg Tabs (Valsartan) .Marland Kitchen... Take 1 tablet by mouth once a day  Problem # 3:  CORONARY ARTERY DISEASE (ICD-414.00) Followed by DrCooper>  continue his meds, exercise, diet rx, etc. His updated medication list for this problem includes:    Adult Aspirin Ec Low Strength 81 Mg Tbec (Aspirin) .Marland Kitchen... Take 1 tablet by mouth once a day    Plavix 75 Mg Tabs (Clopidogrel bisulfate) .Marland Kitchen... Take 1 tablet by mouth once a day    Coreg 12.5 Mg Tabs (Carvedilol) ..... One tablet by mouth two times a day    Diovan 160 Mg Tabs (Valsartan) .Marland Kitchen... Take 1 tablet by mouth once a day  Problem # 4:  HYPERLIPIDEMIA (ICD-272.4) Stable on Cres10, Niaci, Fish Oil, etc>  FLP looks good x low HDL;  LFTs normal... His updated medication list for this problem includes:    Crestor 10 Mg Tabs (Rosuvastatin calcium) .Marland Kitchen... Take 1 tablet by mouth once a day    Slo-niacin 500 Mg Cr-tabs (Niacin) ..... One tablet by mouth once daily  Problem # 5:  GI>>> SEE PREV EVAL BY DrJacobs, DrPatterson w/ tubulovillous adnoma removed from Duod 5/10... he is overdue for GI f/u appt & we will refer...  Problem # 6:  GU>>> Followed by DrTannenbaum for BPH & Low-T on AXIRON topically w/ good testos level and he will continue f/u w/ them.  Problem # 7:  OTHER MEDICAL PROBLEMS AS NOTED>>> Full labs today return WNL.Marland KitchenMarland Kitchen  Complete Medication List: 1)  Adult Aspirin Ec Low Strength 81 Mg Tbec  (Aspirin) .... Take 1 tablet by mouth once a day 2)  Plavix 75 Mg Tabs (Clopidogrel bisulfate) .... Take 1 tablet by mouth once a day 3)  Coreg 12.5 Mg Tabs (Carvedilol) .... One tablet by mouth two times a day 4)  Diovan 160 Mg Tabs (Valsartan) .... Take 1 tablet by mouth once a day 5)  Crestor 10 Mg Tabs (Rosuvastatin calcium) .... Take 1 tablet by mouth once a day 6)  Slo-niacin 500 Mg Cr-tabs (Niacin) .... One tablet by mouth once daily 7)  Fish Oil 1000 Mg Caps (Omega-3 fatty acids) .... Take 1 capsule by mouth once a day 8)  Flax Seed Oil 1000 Mg Caps (Flaxseed (linseed)) .... Take 1 capsule by mouth once a day 9)  Co Q10 100 Mg Tabs (Coenzyme q10) .... Take 1 tablet by mouth once daily 10)  Celebrex 100 Mg Caps (Celecoxib) .... Take 1 tablet by mouth once a day 11)  Glucosamine-chondroitin 1500-1200 Mg/82ml Liqd (Glucosamine-chondroitin) .... Take 1 tablet by mouth once a day 12)  Multivitamins Tabs (Multiple vitamin) .... Take 1 tablet by mouth once a day 13)  Vitamin D3 2000 Unit Caps (Cholecalciferol) .... One cap by mouth once daily 14)  Folic Acid 400 Mcg Tabs (Folic acid) .... Take 1 tablet by mouth once a day 15)  B Complex Tabs (B complex vitamins) .... Take 1 tablet by mouth once a day] 16)  Axiron Actuation Solution 30mg   .... 2 pumps daily (testosterone) 17)  Magic Mouthwash  .... 1 tsp gargle & swallow up to 4 times daily as needed...  Other Orders: Gastroenterology Referral (GI)  Patient Instructions: 1)  Today  we updated your med list- see below.... 2)  We refilled your MMW per request... continue your other meds the same for now... 3)  Please return to our lab one morning this week for your FASTING blood work... please call the "phone tree" in a few days for your lab results.Marland KitchenMarland Kitchen 4)  Try the WRIST SPLINTS at night for Carpal tunnel syndrome...  5)  Keep up the diet efforts and exercise program... 6)  Call for any problems.Marland KitchenMarland Kitchen 7)  Please schedule a follow-up appointment  in 6 months. Prescriptions: MAGIC MOUTHWASH 1 tsp gargle & swallow up to 4 times daily as needed...  #4 oz x prn   Entered and Authorized by:   Michele Mcalpine MD   Signed by:   Michele Mcalpine MD on 06/18/2010   Method used:   Print then Give to Patient   RxID:   [id number]    Immunization History:  Influenza Immunization History:    Influenza:  historical (04/09/2010)

## 2010-06-27 NOTE — Progress Notes (Signed)
Summary: returning a call from rhonda  Phone Note Call from Patient   Caller: Patient Call For: DR Jewelz Ricklefs Summary of Call: Patient phoned stated that he was returning a call from [place]. He can reached at [phone] Initial call taken by: Vedia Coffer,  June 19, 2010 2:25 PM  Follow-up for Phone Call        Pt returned my call and was a little confused about appt b/c he stated he had a physical yesterday and Dr. Kriste Basque didn't mention anything about seeing GI. I advised pt that Dr. Kriste Basque had put an order in for a f/u with Dr. Jarold Motto. Advised pt of appt on Friday 06/22/10 and he stated Friday's were not good for him. Gave pt GI's direct phone number for him to call and R/S this appt to a time that was better for him. Rhonda Cobb  June 19, 2010 3:12 PM

## 2010-07-02 ENCOUNTER — Telehealth: Payer: Self-pay | Admitting: Pulmonary Disease

## 2010-07-05 NOTE — Progress Notes (Signed)
Summary: recd fax for blood test results--returned call  Phone Note Call from Patient Call back at Home Phone [phone]   Caller: Patient Call For: Nicolle Heward Summary of Call: patient phoned and stated that he received a fax of his blood test results and he has two questions regarding this. Patient can be reached at [phone] Initial call taken by: Vedia Coffer,  June 25, 2010 3:23 PM  Follow-up for Phone Call        Spoke with pt.  He states after recieving copy of labs, he noticed that white cell count was slightly low and bilirubin was high.  He states that he is concerned b/c he has had problems with bilirubin before and wonders if the celebrex that he has been taking has been affected his liver.  Would like SN's recs. Pls advise thanks Follow-up by: Vernie Murders,  June 25, 2010 4:10 PM  Additional Follow-up for Phone Call Additional follow up Details #1::        per SN---WBC 4.3 is physiologic for him of no consequece---its his normal----bili 1.4 is similar of no consequence and may represent a mild form of gilberts syndrome---neither of these are of any consequence and no recommendations are needed...of course if he is concerned because of his research and literature he gets from the [place] and [facility] then we can surely refer him to GI---Dr. Christella Hartigan for review.    LMOMTCB Randell Loop [facility]  June 27, 2010 9:01 AM     Additional Follow-up for Phone Call Additional follow up Details #2::    Patient returned Leigh's call.  Please call back at [phone]. Follow-up by: Leonette Monarch,  June 27, 2010 9:52 AM  Additional Follow-up for Phone Call Additional follow up Details #3:: Details for Additional Follow-up Action Taken: called and spoke with pt and per SN----WBC 4.3 is of no consequences---its his normal and the bilirubin at 1.4 is similar of no consequence---neither of these are of any consequence and no recommendations are needed---of course if he is concerned  then we can get him an appt to see Dr. Christella Hartigan for review---pt stated that he has an appt with Dr. Jarold Motto next month and will discuss this with Dr. Jarold Motto at this time. Randell Loop CMA  June 27, 2010 2:46 PM

## 2010-07-05 NOTE — Progress Notes (Signed)
Summary: lab results  Phone Note Call from Patient Call back at Home Phone [phone]   Caller: Patient Call For: nadel Summary of Call: pt wants lab results faxed to him at [phone].  Initial call taken by: Tivis Ringer, CNA,  June 25, 2010 1:32 PM  Follow-up for Phone Call        Called and spoke with pt and verified this msg.  Labs faxed to him at the number provided.  Follow-up by: Vernie Murders,  June 25, 2010 2:51 PM

## 2010-07-11 NOTE — Progress Notes (Signed)
Summary: pt sick-req rx  Phone Note Call from Patient Call back at Home Phone [phone]   Caller: Patient Call For: Sherron Mapp Reason for Call: Acute Illness, Talk to Nurse Summary of Call: Patient c/o sore throat, stuffy nose, green mucus, ears stopped up x 4 days.  Patient requesting rx.  [facility].  Follow-up for Phone Call        Pt denies chills, sweats, n/v/d. c/o productive cough with green mucus, green nasal drainage, ears feel full, sore throat, "fever" (feels like it but has not checked.) starting Thursday. Tried OTC cough syrup w/o relief. Please advise. Thanks. Zackery Barefoot CMA  July 02, 2010 10:15 AM   Allergies: Sulfa drugs  Additional Follow-up for Phone Call Additional follow up Details #1::        per SN----ok for augmentin 875mg   #14  1 by mouth two times a day until gone, mucinex otc 2 by mouth two times a day with plenty of fluids, nasal saline spray every 1-2 hours  and use tylenol for fever, aches.  thanks Randell Loop CMA  July 02, 2010 11:27 AM   rx sent. pt aware of recs.Carron Curie CMA  July 02, 2010 11:45 AM     New/Updated Medications: AUGMENTIN 875-125 MG TABS (AMOXICILLIN-POT CLAVULANATE) Take 1 tablet by mouth two times a day Prescriptions: AUGMENTIN 875-125 MG TABS (AMOXICILLIN-POT CLAVULANATE) Take 1 tablet by mouth two times a day  #14 x 0   Entered by:   Carron Curie CMA   Authorized by:   Michele Mcalpine MD   Signed by:   Carron Curie CMA on 07/02/2010   Method used:   Electronically to        [id number] ([facility]       [address]       [place], [place]  [address]       Ph: [phone] or [phone]       Fax: [phone]   RxID:   [id number]

## 2010-07-12 ENCOUNTER — Ambulatory Visit: Payer: Self-pay | Admitting: Gastroenterology

## 2010-07-17 NOTE — Assessment & Plan Note (Signed)
Summary: G9F   Visit Type:  Follow-up Primary Provider:  Alroy Dust, MD  CC:  No complaints.  History of Present Illness: This is a 65 year-old male with CAD and previous coronary stenting. He has undergone follow-up cath showing a patent RCA stent with residual nonobstructive CAD. He has chronic stable exertional dyspnea. He is spending more time in [place] and would like to establish with a cardiologist there.  Overall he is doing well. He continues with regular exercise. No chest pain or pressure. No edema or other complaints.  Current Medications (verified): 1)  Adult Aspirin Ec Low Strength 81 Mg  Tbec (Aspirin) .... Take 1 Tablet By Mouth Once A Day 2)  Plavix 75 Mg  Tabs (Clopidogrel Bisulfate) .... Take 1 Tablet By Mouth Once A Day 3)  Coreg 12.5 Mg Tabs (Carvedilol) .... One Tablet By Mouth Two Times A Day 4)  Diovan 160 Mg Tabs (Valsartan) .... Take 1 Tablet By Mouth Once A Day 5)  Crestor 10 Mg Tabs (Rosuvastatin Calcium) .... Take 1 Tablet By Mouth Once A Day 6)  Slo-Niacin 500 Mg Cr-Tabs (Niacin) .... One Tablet By Mouth Once Daily 7)  Fish Oil 1000 Mg Caps (Omega-3 Fatty Acids) .... Take 1 Capsule By Mouth Once A Day 8)  Flax Seed Oil 1000 Mg Caps (Flaxseed (Linseed)) .... Take 1 Capsule By Mouth Once A Day 9)  Co Q10 100 Mg Tabs (Coenzyme Q10) .... Take 1 Tablet By Mouth Once Daily 10)  Celebrex 100 Mg Caps (Celecoxib) .... Take 1 Tablet By Mouth Once A Day 11)  Glucosamine-Chondroitin 1500-1200 Mg/36ml  Liqd (Glucosamine-Chondroitin) .... Take 1 Tablet By Mouth Once A Day 12)  Multivitamins   Tabs (Multiple Vitamin) .... Take 1 Tablet By Mouth Once A Day 13)  Folic Acid 400 Mcg Tabs (Folic Acid) .... Take 1 Tablet By Mouth Once A Day 14)  B Complex  Tabs (B Complex Vitamins) .... Take 1 Tablet By Mouth Once A Day] 15)  Axiron Actuation  Solution 30mg  .... 2 Pumps Daily (Testosterone) 16)  Magic Mouthwash .... 1 Tsp Gargle & Swallow Up To 4 Times Daily As  Needed...  Allergies: 1)  ! Septra 2)  ! Sulfa  Past History:  Past medical history reviewed for relevance to current acute and chronic problems.  Past Medical History: Reviewed history from 06/18/2010 and no changes required. 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 HEENT: normal Neck: normal carotid upstrokes without bruits, JVP normal Lungs: CTA CV: RRR without murmur or gallop Abd: soft, NT, positive BS, no bruit, no organomegaly Ext: no clubbing, cyanosis, or edema. peripheral pulses 2+ and equal Skin: warm and dry without rash    EKG  Procedure date:  06/26/2010  Findings:      Sinus rhythm with LBBB, unchanged from past tracing. HR 70 bpm.  Impression & Recommendations:  Problem # 1:  CORONARY ARTERY DISEASE (ICD-414.00) Pt stable without angina. Continue current medical  therapy. He will continue to follow here as he is in [place] 3-4 days per week. I looked through a list of providers in [place] but don't know any cardiologists for a personal reference.  His updated medication list for this problem includes:    Adult Aspirin Ec Low Strength 81 Mg Tbec (Aspirin) .Marland Kitchen... Take 1 tablet by mouth once a day    Plavix 75 Mg Tabs (Clopidogrel bisulfate) .Marland Kitchen... Take 1 tablet by mouth once a day    Coreg 12.5 Mg Tabs (Carvedilol) ..... One tablet by mouth two times a day  Orders: EKG w/ Interpretation (93000)  Problem # 2:  HYPERTENSION (ICD-401.9) Controlled.  His updated medication list for this problem includes:    Adult Aspirin Ec Low Strength 81 Mg Tbec (Aspirin) .Marland Kitchen... Take 1 tablet by mouth once a day    Coreg 12.5 Mg Tabs (Carvedilol) ..... One tablet by mouth two times a day    Diovan 160 Mg Tabs (Valsartan) .Marland Kitchen... Take 1 tablet by mouth once a day  Orders: EKG w/ Interpretation (93000)  BP today: 121/69 Prior BP: 132/70 (06/18/2010)  Labs Reviewed: K+: 4.3 (06/21/2010) Creat: : 0.9 (06/21/2010)   Chol: 101 (06/21/2010)   HDL: 25.80 (06/21/2010)   LDL: 51 (06/21/2010)   TG: 121.0 (06/21/2010)  Problem # 3:  HYPERLIPIDEMIA (ICD-272.4) Very low HDL. LDL is at goal. Continue combination of statin/niacin.  His updated medication list for this problem includes:    Crestor 10 Mg Tabs (Rosuvastatin calcium) .Marland Kitchen... Take 1 tablet by mouth once a day    Slo-niacin 500 Mg Cr-tabs (Niacin) ..... One tablet by mouth once daily  Orders: EKG w/ Interpretation (93000)  CHOL: 101 (06/21/2010)   LDL: 51 (06/21/2010)   HDL: 25.80 (06/21/2010)   TG: 121.0 (06/21/2010)  Patient Instructions: 1)  Your physician recommends that you continue on your current medications as directed. Please refer to the Current Medication list given to you today. 2)  Your physician wants you to follow-up in: 6 MONTHS.   You will receive a reminder letter in the mail two months in  advance. If you don't receive a letter, please call our office to schedule the follow-up appointment.

## 2010-08-14 ENCOUNTER — Other Ambulatory Visit: Payer: Self-pay | Admitting: Cardiovascular Disease

## 2010-08-16 NOTE — Telephone Encounter (Signed)
[address] °

## 2010-08-29 LAB — DIFFERENTIAL
Basophils Absolute: 0 10*3/uL (ref 0.0–0.1)
Eosinophils Absolute: 0.2 10*3/uL (ref 0.0–0.7)
Eosinophils Relative: 5 % (ref 0–5)

## 2010-08-29 LAB — CBC
HCT: 38.6 % — ABNORMAL LOW (ref 39.0–52.0)
MCV: 90.2 fL (ref 78.0–100.0)
Platelets: 264 10*3/uL (ref 150–400)
RDW: 13.6 % (ref 11.5–15.5)

## 2010-08-29 LAB — AFB CULTURE WITH SMEAR (NOT AT ARMC)

## 2010-08-29 LAB — COMPREHENSIVE METABOLIC PANEL
Albumin: 3.2 g/dL — ABNORMAL LOW (ref 3.5–5.2)
BUN: 26 mg/dL — ABNORMAL HIGH (ref 6–23)
Chloride: 110 mEq/L (ref 96–112)
Creatinine, Ser: 0.68 mg/dL (ref 0.4–1.5)
Glucose, Bld: 119 mg/dL — ABNORMAL HIGH (ref 70–99)
Total Bilirubin: 0.9 mg/dL (ref 0.3–1.2)
Total Protein: 5.2 g/dL — ABNORMAL LOW (ref 6.0–8.3)

## 2010-08-29 LAB — CARDIAC PANEL(CRET KIN+CKTOT+MB+TROPI)
Relative Index: INVALID (ref 0.0–2.5)
Troponin I: 0.01 ng/mL (ref 0.00–0.06)

## 2010-08-29 LAB — POCT I-STAT, CHEM 8
Calcium, Ion: 1.26 mmol/L (ref 1.12–1.32)
Creatinine, Ser: 0.9 mg/dL (ref 0.4–1.5)
Glucose, Bld: 98 mg/dL (ref 70–99)
HCT: 38 % — ABNORMAL LOW (ref 39.0–52.0)
Hemoglobin: 12.9 g/dL — ABNORMAL LOW (ref 13.0–17.0)

## 2010-08-29 LAB — PROTIME-INR
INR: 1.1 (ref 0.00–1.49)
Prothrombin Time: 14 seconds (ref 11.6–15.2)

## 2010-08-29 LAB — FUNGUS CULTURE W SMEAR

## 2010-08-29 LAB — TSH: TSH: 2.165 u[IU]/mL (ref 0.350–4.500)

## 2010-08-29 LAB — SEDIMENTATION RATE: Sed Rate: 10 mm/hr (ref 0–16)

## 2010-10-02 NOTE — Cardiovascular Report (Signed)
Green, Billy                ACCOUNT NO.:  [id number]   MEDICAL RECORD NO.:  [id number]          PATIENT TYPE:  OIB   LOCATION:  1962                         FACILITY:  [facility]   PHYSICIAN:  Veverly Fells. Excell Seltzer, MD  DATE OF BIRTH:  11-10-1945   DATE OF PROCEDURE:  11/12/2007  DATE OF DISCHARGE:  11/12/2007                            CARDIAC CATHETERIZATION   PROCEDURES:  Left heart catheterization, selective coronary angiography,  and left ventricular angiography.   INDICATIONS:  [name] is a 65 year old gentleman with coronary artery  disease.  He has had previous stenting in the right coronary artery.  He  has longstanding exertional dyspnea with accentuation of his symptoms  more recently.  He has undergone outpatient evaluation and has an  abnormal cardiopulmonary exercise test.  He was referred for cardiac  catheterization for definitive evaluation.   Risks and indications of the procedure were reviewed with the patient.  Informed consent was obtained.  The right groin was prepped and draped,  anesthetized 1% lidocaine using modified Seldinger technique.  A 4-  French sheath was placed in the right femoral artery.  Standard 4-French  Judkins catheters were used for coronary angiography and left  ventriculography.  During a catheter exchange, the patient developed  ventricular fibrillation for approximately 15 seconds.  The wire and  catheter were removed and he spontaneously went back into sinus rhythm  with coughing.  He will be remained hemodynamically stable throughout  the remainder of the procedure and had no further problems.  He  tolerated the procedure well and there were no complications.   FINDINGS:  Aortic pressure 128/69 with a mean of 95, left ventricular  pressure 127/22.   CORONARY ANGIOGRAPHY:  Left mainstem:  Minor luminal irregularities  bifurcates into the LAD and left circumflex.   LAD:  The LAD is a medium caliber vessel in the proximal aspect.   There  are minor luminal irregularities.  There is an approximate 30% stenosis  after the first septal perforator at the area of the first diagonal  branch.  There are two medium-sized diagonal branches.  The remaining  portions of the mid and distal LAD are free of any significant stenosis.  The mid and distal LAD are relatively small caliber vessels.   Left circumflex:  The left circumflex is large caliber.  It supplies  three marginal branches in the region of a second, third, and fourth  marginal.  There is a tiny first OM.  There is no significant stenosis  throughout the left circumflex.  However, there are minor luminal  irregularities in the midportion of the circumflex.   Right coronary artery:  The right coronary artery is dominant.  There is  a widely patent stent in the midportion of the vessel.  Just beyond the  stented segment, there is 20% stenosis with minor luminal irregularity.  The PDA is large caliber and has no significant stenosis.  There is a  very small first PL branch and a larger second PL branch that has no  significant stenosis.   Left ventriculography shows anteroapical hypokinesis.  The LVEF  is 50%.  There is no mitral regurgitation.   ASSESSMENT:  1. Widely patent right coronary artery stent.  2. Minimal nonobstructive coronary artery disease.  3. Mild segmental left ventricular dysfunction.   PLAN:  I will continue medical therapy as [name] is on an excellent  medical regimen that includes an ACE inhibitor, beta-blocker, aspirin,  and Plavix.  He has no significant obstructive coronary disease.  I am  not sure of the etiology of his wall motion abnormality because it does  not fit coronary distribution with his stented right coronary artery.  We will check an echo to reassess his LV function as an outpatient.  I  will set up a followup appointment in approximately 3 months as his  primary physician, Dr. Diona Browner, is moving his primary practice  to  [facility].      Veverly Fells. Excell Seltzer, MD  Electronically Signed     MDC/MEDQ  D:  11/12/2007  T:  11/12/2007  Job:  [id number]   cc:   Jonelle Sidle, MD

## 2010-10-02 NOTE — Assessment & Plan Note (Signed)
[facility]                            CARDIOLOGY OFFICE NOTE   [name], [name]                       MRN:          [id number]  DATE:09/14/2007                            DOB:          1945/08/05    PRIMARY CARE PHYSICIAN:  Lonzo Cloud. Kriste Basque, M.D.   REASON FOR VISIT:  Cardiac followup.   HISTORY OF PRESENT ILLNESS:  [name] comes back for a routine visit  denying any problems with significant angina.  He continues to report  NYHA class II dyspnea on exertion, which troubles him.  His  electrocardiogram shows sinus rhythm with a stable left bundle branch  block pattern.  I note that since I last saw him, he was documented to  have liver function test abnormalities and was referred to Dr.  Jarold Motto.  He was taken off of Crestor temporarily and then started  back on the medication, with subsequent normalization of his liver  function.  In reviewing his history, he states that he had an illness  around the time of the documented laboratory abnormalities, possibly  viral.  Further evaluation for potential hepatic disease was unrevealing  based on Dr. Norval Gable notes.  [name] is now also on Protonix  instead of Prilosec, given current treatment with Plavix.  His last  ischemic evaluation was via an adenosine MRI done at [facility]  back in September of 2007.  This study was normal.  He reports  continuing to go to the gym and exercise 4 out of 7 days of the week.  His main limitation is related to shortness of breath without frank  chest pain, and although it does not sound as if this pattern has  changed, he remains concerned about it.  We talked about this some  today.  I note that when he was off of Crestor, his subsequent LDL  increased into the 140s.  He has been on this medication for many years  and has generally tolerated it well.   ALLERGIES:  SULFA DRUGS.   MEDICATIONS:  1. Super B complex daily.  2. Flax seed oil daily.  3. Crestor 10 mg daily.  4. Protonix 40 mg daily.  5. Celebrex 200 mg daily.  6. Glucosamine and chondroitin daily.  7. Enteric-coated aspirin 81 mg daily.  8. Co-Q-10 50 mg daily.  9. Altace 10 mg daily.  10.Coreg 3.125 mg p.o. b.i.d.  11.Plavix 75 mg daily.  12.Folic acid 400 mg daily.  13.Niaspan 500 mg daily.  14.Calcium with vitamin D 500 mg daily.   REVIEW OF SYSTEMS:  As outlined above.  No orthopnea or PND.  No  palpitations, no claudication.  He has complained of arthritic knee  pain, although he is still able to exercise with this.  He states that  he eventually we will need knee replacement, however.   PHYSICAL EXAMINATION:  VITAL SIGNS:  Heart rate 75, blood pressure is  128/80.  Weight is 171 pounds.  GENERAL:  The patient is comfortable, normally nourished, in no acute  distress.  HEENT:  Conjunctivae and lids normal.  Oropharynx clear.  NECK:  Supple.  No elevated jugular venous pressure.  No loud bruits or  thyromegaly.  LUNGS:  Clear without labored breathing.  CARDIAC:  Regular rate and rhythm.  No S3 gallop or pericardial rub  heard.  Paradoxically split S2.  ABDOMEN:  Soft, nontender.  No bruits.  Normoactive bowel sounds.  EXTREMITIES:  No frank pitting edema.  Distal pulses 2+.  SKIN:  Warm and dry.  MUSCULOSKELETAL:  No kyphosis noted.  NEUROPSYCHIATRIC:  The patient alert and oriented x3.  Affect is  appropriate.   IMPRESSION AND RECOMMENDATIONS:  Fairly long term dyspnea on exertion in  the setting of ischemic heart disease status post previous stent placed  to the right coronary artery in 2005.  The patient has a chronic left  bundle branch block pattern on electrocardiogram, and his most recent  ischemic evaluation was via an adenosine MRI in September of 2007 at  [facility] which was normal.  We talked about this some today, and I would  like to schedule him for a cardiopulmonary stress test.  Based on this,  we can decide whether to proceed further  with any additional assessment  or continue medical therapy.  He overall seems to be doing fairly well.  I have asked him to continue his Crestor for the time being, and we will  anticipate a followup liver function and lipid test in 3 months' time.  Otherwise, we will plan a 37-month followup visit.     Jonelle Sidle, MD  Electronically Signed    SGM/MedQ  DD: 09/14/2007  DT: 09/14/2007  Job #: [id number]   cc:   Lonzo Cloud. Kriste Basque, MD

## 2010-10-02 NOTE — Assessment & Plan Note (Signed)
[facility]                         GASTROENTEROLOGY OFFICE NOTE   [name], [name]                       MRN:          [id number]  DATE:01/22/2007                            DOB:          06/29/1945    [name] returns after his endoscopy and dilation in early June. He has  been on Prevacid 30 mg a day and is asymptomatic at this point. He does  have a lot of abdominal gas and bloating which he relates to taking  excessive fiber pills. He also chews a fair amount of diet gum. I have  asked him to avoid sorbitol and fructose and to switch to Benefiber 1  teaspoon with juice in the morning. He has been advised to take his  Prevacid chronically 30 minutes before breakfast and twice a day as  needed. He is up to date on his colonoscopy and I will return him to the  overall excellent general medical care of Dr. Kriste Basque with yearly follow  up or p.r.n. as needed.     Vania Rea. Jarold Motto, MD, Caleen Essex, FAGA  Electronically Signed    DRP/MedQ  DD: 01/22/2007  DT: 01/22/2007  Job #: [id number]   cc:   Lonzo Cloud. Kriste Basque, MD

## 2010-10-02 NOTE — Assessment & Plan Note (Signed)
[facility]                            CARDIOLOGY OFFICE NOTE   YANIV, LAGE                       MRN:          [id number]  DATE:02/10/2007                            DOB:          1945-07-14    FOLLOW-UP VISIT:   PRIMARY CARE PHYSICIAN:  Lonzo Cloud. Kriste Basque, MD   REASON FOR VISIT:  Routine cardiac follow-up.   HISTORY OF PRESENT ILLNESS:  Mr. Gettis comes in for a 68-month visit.  He is not reporting any progression in what he describes as very mild  dyspnea on exertion.  He has occasional chest pain, mainly when he is  emotionally upset, but no exertional chest pain.  His electrocardiogram  shows a stable left bundle branch block pattern.  Last ischemic  evaluation was with an adenosine MRI at [facility] in September of  last year.  Today we talked about his medical regimen and planned follow-  up of his lipids as well as a fasting glucose and hemoglobin A1c.  We  discussed indications for repeat ischemic testing and at this point will  hold off on any additional studies given his clinical stability.  He has  had some orthopedic problems and back pain apparently due to disk  disease.  He plans to begin a trial of Ultram in lieu of Celebrex, which  is certainly reasonable given his cardiac status, although he has been  on this medication for at least 10 years and has tolerated it fairly  well.  The associated cardiac risks are clearly documented, although  fairly low percentage-wise.  We discussed this today and have discussed  it in the past.   ALLERGIES:  SULFA DRUGS.   PRESENT MEDICATIONS:  1. Multivitamin daily.  2. Crestor 10 mg p.o. daily.  3. Celebrex 200 mg p.o. daily.  4. Omega-3 fish oil supplements 2400 mg p.o. daily.  5. Glucosamine/chondroitin daily.  6. Aspirin 81 mg p.o. daily.  7. Co-Q 10 50 mg p.o. daily.  8. Altace 10 mg p.o. daily.  9. Coreg 3.125 mg p.o. b.i.d.  10.Plavix 75 mg daily.  11.Folic acid 400 mg p.o.  daily.  12.Niaspan 500 mg p.o. daily.  13.Vitamin D.  14.Prevacid 30 mg p.o. daily.  15.Sublingual nitroglycerin p.r.n. (has not used).   REVIEW OF SYSTEMS:  As described in the history of present illness.   EXAMINATION:  Blood pressure is 140/60, heart rate of 76, weight is 170  pounds, down approximately 10 pounds.  The patient is in no acute distress.  Examination of the neck reveals no elevated jugular venous pressure.  No  bruits or thyromegaly is noted.  LUNGS:  Clear without labored breathing at rest.  CARDIAC:  A regular rate and rhythm.  No rub, murmur or gallop.  ABDOMEN:  Soft, no bruits.  EXTREMITIES:  No significant pitting edema.   IMPRESSION AND RECOMMENDATIONS:  1. Coronary artery disease, status post previous stent placement to      the right coronary artery in 2005.  Adenosine Myoview at [facility] last September demonstrated no ischemia.  Symptomatically the patient is stable and I do not anticipate any      further testing at this particular time.  He is on an adequate      medical regimen.  I would like a follow-up lipid panel and to this      we will add a fasting glucose and hemoglobin A1c.  I will otherwise      plan to see him back over the next 6 months.  2. Continue regular follow-up with Dr. Kriste Basque.     Jonelle Sidle, MD  Electronically Signed    SGM/MedQ  DD: 02/10/2007  DT: 02/11/2007  Job #: [id number]   cc:   Lonzo Cloud. Kriste Basque, MD

## 2010-10-02 NOTE — Assessment & Plan Note (Signed)
[facility]                            CARDIOLOGY OFFICE NOTE   HAN, VEJAR                       MRN:          [id number]  DATE:12/23/2007                            DOB:          [birth year]    PRIMARY CARDIOLOGIST:  Veverly Fells. Excell Seltzer, MD.   PRIMARY CARE PHYSICIAN:  Lonzo Cloud. Kriste Basque, MD.   [name] is a very pleasant 65 year old married white male patient who  underwent cardiac catheterization because of recent symptoms of dyspnea  on exertion.  Cath revealed a widely patent right coronary artery stent,  minimal nonobstructive coronary artery disease, and mild segmental LV  dysfunction, ejection fraction 50%.  No MR.  Medical therapy was  recommended.  Dr. Excell Seltzer was unsure of the etiology of this wall motion  abnormality because it does not fit coronary description of his stented  RCA, so ordered a 2-D echocardiogram.  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  LV size was normal.  Overall, LV function was  lower limits of normal, EF 45-50% with mild focal basal septal  hypertrophy, and Doppler parameters were consistent with abnormal LV  relaxation.  Left atrium was mildly dilated.  There was mild MR.   The patient comes in today saying his dyspnea on exertion is more  related to heavy lifting or going up stairs.  He can walk on a treadmill  for 30 minutes without getting short of breath.  He has not exercised in  6 weeks for various reasons and one of which is he is moving.  He  complains of significant left lower leg cramping since last Thursday.  He says he has had several days of severe cramping that just did not go  away and that his left ankle began to swell.  He was also having trouble  with his right groin after cath and had Doppler, which he tells me  showed enlarged lymph nodes but no problem at the cath site.  I am  trying to obtain this report.   CURRENT MEDICATIONS:  1. Multivitamin daily.  2. CPAP at bedtime.  3. Celebrex 200 mg daily.  4. Fish oil daily.  5. Glucosamine/chondroitin daily.  6. Baby aspirin 81 mg daily.  7. CoQ10, 50 mg daily.  8. Altace 10 mg daily.  9. Coreg 3.125 mg b.i.d.  10.Plavix 75 mg daily.  11.Folic acid 400 mg daily.  12.Niaspan 500 mg daily.  13.Calcium D 500 mg daily.  14.Super B-complex daily.  15.Crestor 10 mg daily, which he stopped a week ago when he had the      leg cramps.  16.Protonix 40 mg daily.   PHYSICAL EXAMINATION:  GENERAL:  The patient does complain of sore  throat.  Throat is erythematous.  He has got an ulcer on his left  tonsil.  VITAL SIGNS:  Blood pressure 132/82, pulse 78, weight 172.  NECK:  Without JVD, HJR, bruit, or thyroid enlargement.  LUNGS:  Clear anterior, posterior, and lateral.  HEART:  Regular rate and rhythm at 78 beats per minute.  Normal S1 and  S2 with  a positive S4 and a 2/6 systolic ejection murmur at the left  sternal border.  ABDOMEN:  Soft without organomegaly, masses, lesions, or abnormal  tenderness.  EXTREMITIES:  Left lower leg, he does have bruising from his calf down  to his ankle that is resolving.  He says there was no injury.  He has  got a little puffiness at his ankle.  He has got strong arterial pulses  bilaterally, and his left calf is painful to touch.  Right lower  extremity without abnormality.  Right groin without hematoma or  hemorrhage.  He has got good distal pulses.  He does have right groin  enlarged lymph nodes.   IMPRESSION:  1. Dyspnea on exertion, question etiology.  Cardiac cath revealed      widely patent right coronary artery stent, minimal nonobstructive      coronary artery disease, and mild segmental left ventricular      dysfunction, ejection fraction 50%.  A 2-D echocardiogram, ejection      fraction 45-50% with septal dyssynergy.  2. Left calf pain.  3. Sore throat and lymph node enlargement, question infection.  4. Coronary artery disease status post drug-eluting stent to  the right      coronary artery in 2005.  5. Hyperlipidemia, on Crestor.  6. Hypertension.  7. Arthritis.  8. History of hiatal hernia and esophageal stricture.   PLAN:  I have increased the patient's Coreg to 6.25 mg b.i.d.  We will  add BMET to his previously scheduled blood work to make sure his  potassium is stable.  We will check a lower extremity venous Doppler to  rule out DVT, although it looks more like a trauma injury to me, and I  asked him to schedule appointment with Dr. Kriste Basque to follow up on his  sore throat and lymph node enlargement.  He will see Dr. Excell Seltzer back in  2-3 months.      Jacolyn Reedy, PA-C  Electronically Signed      Rollene Rotunda, MD, [facility]  Electronically Signed   ML/MedQ  DD: 12/23/2007  DT: 12/23/2007  Job #: [id number]

## 2010-10-02 NOTE — Assessment & Plan Note (Signed)
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   [name] is completely asymptomatic but had a liver profile drawn May 19, 2007 that showed mild increase in his liver function tests with an  SGPT of 154, and an SGOT of 70 with otherwise normal enzymes, albumin,  and bilirubin.  CBC was otherwise normal with a white count of 4500.  He  was taken off of Crestor, which he takes for his hyperlipidemia, and has  not had repeat liver enzymes.  About the time this all occurred, he had  a viral illness with nausea, vomiting, and diarrhea.   He has no history of known liver disease or family history of liver  problems.  He feel swell.  He has had no systemic complaints.  He has a  long, involved cardiac history, as per my previous notes, and is under  the care of our cardiologist, and has had coronary artery stenting.  Is  on daily Plavix and aspirin 81 mg.   I have recently seen [name] for his acid reflux and he had a peptic  stricture of the esophageus that was dilated in June.  He is taking  currently Prevacid 30 mg twice a day, and is asymptomatic in terms of  any GI complaints.  He is up to date on his colonoscopy.  His appetite  is good and his weight is stable.  He specifically denies any mental  status changes pruritus, chronic fatigue, rashes, or abdominal pain.  He  is on multiple medications listed and reviewed in his chart.  Family  history is noncontributory in terms of known liver problems.   He is a healthy-appearing white male in no distress, appearing his  stated age.  He weighs 170 pounds, blood pressure 140/78, and pulse was 88 and  regular.  I could not  appreciate stigmata of chronic liver disease.  There was no  hepatosplenomegaly, abdominal masses or tenderness, or evidence of  ascites.  Bowel sounds were normal.  Mental status was clear.   I think [name]'s liver enzyme elevation is probably a reactive hepatitis  from a viral illness he had in December versus drug reaction, perhaps  related to Crestor.   RECOMMENDATIONS:  1. Repeat liver enzymes.  2. Check metabolic and vital hepatic studies.  3. Upper abdominal ultrasound exam.  4. Further workup depending on clinical course and laboratory      evaluation.  5. Continue other multiple cardiac medications as per Drs. Nadel and      Pulsipher.  6. GI followup in 1 month's time.     Vania Rea. Jarold Motto, MD, [facility]ale[facility] Essex, FAGA  Electronically Signed    DRP/MedQ  DD: 06/04/2007  DT: 06/04/2007  Job #: [id number]   cc:   Lonzo Cloud. Kriste Basque, MD  Loraine Leriche  W. Pulsipher, M.D. [facility]

## 2010-10-02 NOTE — Assessment & Plan Note (Signed)
[facility]                            CARDIOLOGY OFFICE NOTE   TRENTIN, KNAPPENBERGER                       MRN:          [id number]  DATE:02/10/2008                            DOB:          [birth year]    [name] returns for followup at the [facility] office on  February 10, 2008.  He is a 65 year old gentleman with coronary artery  disease and mild LV dysfunction.  He has been followed by Dr. Diona Browner,  and I will be assuming his cardiac care from here forward as Dr.  Diona Browner has moved his primary practice to [facility].   The patient has chronic exertional dyspnea.  His symptoms have been  stable over several years.  With one flight of stairs or brisk walking,  he complains of dyspnea.  He has no orthopnea, PND, or edema.  He denies  chest pain or pressure.  He has had an extensive cardiovascular  evaluation which includes recent cardiac catheterization, cardiac MR,  cardiopulmonary stress testing, and 2-D echocardiography.  His symptoms  have been somewhat out of proportion to his underlying myocardial  disease.  His catheterization from November 12, 2007, demonstrated a widely  patent stent in the right coronary artery and minimal nonobstructive  disease elsewhere.  There was mild segmental LV dysfunction, and the  LVEF was estimated at 50%.  Followup 2-D echocardiogram to better assess  his LV function was performed on December 01, 2007.  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  His LVEF was estimated at 45-50%, and  Doppler parameters were consistent with abnormal LV relaxation.   Current medications include Coreg 6.25 mg twice daily, flaxseed oil,  multivitamin, Celebrex 200 mg daily, fish oil 1200 mg daily,  glucosamine/chondroitin 1500/1200 mg daily, aspirin 81 mg daily,  coenzyme Q10 50 mg daily, Altace 10 mg daily, Plavix 75 mg daily, folic  acid 400 mg daily, Niaspan 500 mg daily, calcium plus D, super B  complex,  Crestor 10 mg daily, and Protonix daily.   ALLERGIES:  SULFA causes rash.   PHYSICAL EXAMINATION:  GENERAL:  On exam, the patient is alert and  oriented in no acute distress.  VITAL SIGNS:  Weight 171 pounds, blood pressure 122/71, heart rate 76,  respiratory rate is 12.  HEENT:  Normal.  NECK:  Normal carotid upstrokes.  No bruits.  JVP normal.  LUNGS:  Clear bilaterally.  HEART:  Regular rate and rhythm.  No murmurs or gallops.  ABDOMEN:  Soft, nontender, no organomegaly, no bruits.  EXTREMITIES:  No clubbing, cyanosis, or edema.  Peripheral pulses 2+ and  equal.   EKG shows sinus rhythm with left bundle branch block and first-degree AV  block.   ASSESSMENT:  1. Chronic exertional dyspnea.  2. Coronary artery disease status post percutaneous coronary      intervention of the right coronary artery in 2005 with widely      patent stent and nonobstructive disease elsewhere.  3. Hyperlipidemia.  4. Essential hypertension.  5. Left bundle branch block.  6. Mild left ventricular dysfunction.   DISCUSSION:  [name] is stable from a cardiovascular standpoint.  His  blood pressure and risk factors appear to be under good control.  His  LDL cholesterol is above goal, but he has a history of elevated LFTs.  He is tolerating a combination of low-dose Crestor and Niaspan.  I think  I will leave this alone at present and review with him in the future.  We may consider titrating up his Crestor and Niaspan, but this will need  to be done cautiously with that history.  The patient will return for  followup in 3 months.  He was encouraged to reinitiate his exercise  program.  He has not been exercising because of some back problems, but  this seems to be improving.     Veverly Fells. Excell Seltzer, MD  Electronically Signed    MDC/MedQ  DD: 02/10/2008  DT: 02/10/2008  Job #: [id number]   cc:   Lonzo Cloud. Kriste Basque, MD

## 2010-10-02 NOTE — Assessment & Plan Note (Signed)
[facility]                            CARDIOLOGY OFFICE NOTE   [name], [name]                       MRN:          [id number]  DATE:05/18/2008                            DOB:          [birth year]    REASON FOR VISIT:  CAD.   HISTORY OF PRESENT ILLNESS:  [name] is a 65 year old gentleman with  coronary artery disease and previous right coronary artery stenting.  He  has had mild LV dysfunction and has chronic left bundle branch block.  He underwent cardiac catheterization for evaluation of exertional  dyspnea and concern over, this represented an anginal equivalent.  His  cardiac cath was performed in June and demonstrated a widely patent RCA  stent with minimal nonobstructive CAD.   From a symptomatic standpoint, he continues to have exertional dyspnea.  This occurs with walking upstairs or getting in a hurry.  This is  unchanged over time.  He also has had recent episodes of chest pain  during emotional stress when he becomes angry.  He has had no exertional  chest pain.  He has no other complaints.   MEDICATIONS:  1. Multivitamin 1 daily.  2. Celebrex 200 mg daily.  3. Fish oil 1200 mg daily.  4. Glucosamine.  5. Chondroitin.  6. Aspirin 81 mg daily.  7. Coenzyme Q10.  8. Altace 10 mg daily.  9. Plavix 75 mg daily.  10.Folic acid 400 mg daily.  11.Niaspan 500 mg daily.  12.Calcium plus D.  13.Super B-Complex.  14.Crestor 10 mg.  15.Coreg 6.25 mg b.i.d.  16.Flaxseed oil.   ALLERGIES:  SULFA.   PHYSICAL EXAMINATION:  GENERAL:  The patient is alert and oriented, no  acute distress.  VITAL SIGNS:  Weight is 175 pounds, blood pressure 150/90, heart rate  74, and respiratory rate 16.  HEENT:  Normal.  NECK:  Normal carotid upstrokes.  No bruits.  JVP normal.  LUNGS:  Clear bilaterally.  HEART:  Regular rate and rhythm.  No murmurs or gallops.  ABDOMEN:  Soft and nontender.  No organomegaly.  EXTREMITIES:  No clubbing,  cyanosis, or edema.  Peripheral pulses are  intact and equal.  SKIN:  Warm and dry without rash.   EKG shows sinus rhythm with left bundle branch block and first-degree AV  block.   LABS REVIEWED:  Total cholesterol 177, triglycerides 130, HDL 36, and  LDL 115.  LFTs within normal limits.  These were drawn on December 23, 2007.   ASSESSMENT:  1. Coronary artery disease.  We will continue his current medical      program.  He is having no angina.  His chest pain is likely stress      related.  He had a recent catheterization with widely patent      coronaries and has no exertional symptoms.  2. Dyslipidemia.  LDL is above goal.  I discussed increasing his      Crestor, but he is worried about doing this because he has had      elevated LFTs in the past.  He would like to give a  trial to diet      and exercise.  I think that is reasonable since he is just slightly      above goal.  I encouraged him to increase his exercise program.  He      has exercised in the past, but has fallen off over the last few      months.  3. Hypertension with suboptimal control.  Increase Coreg to 12.5 mg      twice daily.  Continue Altace.  4. Exertional dyspnea.  He has undergone extensive evaluation      including echocardiography, cardiac cath, and cardiopulmonary      exercise testing.  It has been difficult to ascertain the exact      etiology of his exertional dyspnea.  In an event, his symptom is      stable.  Continue to follow.   For followup, I would like to see him back in 6 months.     Veverly Fells. Excell Seltzer, MD  Electronically Signed    MDC/MedQ  DD: 05/18/2008  DT: 05/19/2008  Job #: [id number]

## 2010-10-02 NOTE — Op Note (Signed)
[name], [name]                ACCOUNT NO.:  [id number]   MEDICAL RECORD NO.:  [id number]          PATIENT TYPE:  INP   LOCATION:  1424                         FACILITY:  [facility]   PHYSICIAN:  Casimiro Needle B. Sherene Sires, MD, FCCPDATE OF BIRTH:  04-18-1946   DATE OF PROCEDURE:  DATE OF DISCHARGE:                               OPERATIVE REPORT   PROCEDURE:  Fiberoptic bronchoscopy, diagnostic, with lavage.   HISTORY AND INDICATIONS:  New onset hemoptysis, remote history of  smoking.   DESCRIPTION OF PROCEDURE:  The patient agreed to the procedure after  full discussion of the risks, benefits, and alternatives.   The procedure was performed in the bronchoscopy suite with continuous  monitoring by surface ecg and oximetry.  The patient maintained adequate  saturations throughout procedure in sinus rhythm and received a total of  5 mg IV Versed and 3 mg IV Demerol through the procedure with adequate  sedation and cough suppression.   The right naris and oropharynx were anesthetized with 10% lidocaine  spray.  The right naris was additionally prepared with 2% lidocaine  jelly.   Using a standard flexible fiberoptic bronchoscope, the right naris was  easily cannulated with good visualization of the entire oropharynx and  larynx.  The cords moved normally, and there were no apparent upper  airway lesions, nor any evidence of blood above the cords.   Subsequently, the entire tracheobronchial tree was explored bilaterally  with the following findings:  Trachea, carina, and all the airways were  opened widely to the subsegmental level with no evidence of  endobronchial abnormalities or, for that matter, any significant excess  secretions, bloody or otherwise.  Specifically, there was no source of  bleeding and no evidence of previous bleeding below the level of the  cords.   I did lavage the lingula to be complete and sent this for cytology, AFB,  and fungal stain and culture.   IMPRESSION:   Hemoptysis of unclear etiology, probably not coming from  below the level of the cords, based on the absence of any retained  bloody secretions within 12 hours of last reported hemoptysis.      [name]. Sherene Sires, MD, [facility]  Electronically Signed     MBW/MEDQ  D:  08/31/2008  T:  08/31/2008  Job:  [id number]

## 2010-10-02 NOTE — Assessment & Plan Note (Signed)
[facility]                         GASTROENTEROLOGY OFFICE NOTE   MATTHEW, CINA                       MRN:          [id number]  DATE:07/09/2007                            DOB:          1945-08-31    Gergory's liver function tests have all returned to normal and his  metabolic liver and viral liver profiles were all negative.  He stopped  his Crestor and I have suggested to him that he restart that as a  therapeutic trial, and we will check his liver tests in a month.  I  switched him from Prevacid to Protonix as recommended for people on  Plavix, because of interaction with other PPI, and cytochrome P450  metabolic enzyme system in the liver.  We will check Taydon's colonoscopy  in August as per our clinical protocol, and will hold his aspirin and  Plavix for 5 to 7 days before their procedure.  He is to continue all of  his other medications in the interim as listed and reviewed in the  chart.  The patient completely asymptomatic at this time.     Vania Rea. Jarold Motto, MD, [facility], FAGA  Electronically Signed    DRP/MedQ  DD: 07/09/2007  DT: 07/09/2007  Job #: [id number]   cc:   Lonzo Cloud. Kriste Basque, MD  Rollene Rotunda, MD, [facility]

## 2010-10-02 NOTE — Discharge Summary (Signed)
Billy, Green                ACCOUNT NO.:  [id number]   MEDICAL RECORD NO.:  [id number]          PATIENT TYPE:  INP   LOCATION:  1424                         FACILITY:  [facility]   PHYSICIAN:  Lonzo Cloud. Kriste Basque, MD     DATE OF BIRTH:  08/13/1945   DATE OF ADMISSION:  08/31/2008  DATE OF DISCHARGE:  09/01/2008                               DISCHARGE SUMMARY   FINAL DIAGNOSES:  1. Admitted August 31, 2008 with several hours of bright red      hemoptysis.  He had a minimal smoking history and no pulmonary      issues.  He had a cough related to ACE inhibitors and had been      switched to Benicar.  He was on aspirin and Plavix therapy because      of coronary artery disease and a stent.  The emergency room      evaluation included a clear chest x-ray and negative CT angio of      the chest.  His hemoglobin was normal at 13.1.  Bronchoscopy by Dr.      Sherene Sires revealed no lesions and no evidence of any blood below the      level of the cords.  Bleeding stopped spontaneously, and he is      being discharged to complete his workup with an ENT evaluation of      his upper tracts.  2. History of obstructive sleep apnea on continuous positive airway      pressure.  3. History of hypertension - controlled on medications with Altace      changed to Benicar.  4. History of coronary artery disease with right coronary artery      stent.  5. Hyperlipidemia on Crestor and Niaspan.  6. History of hiatus hernia and esophageal stricture.  7. History of colon polyps.  8. History of osteoarthritis and back pain.   BRIEF HISTORY AND PHYSICAL:  The patient is a 65 year old white man  presented to the emergency room in the wee hours of August 31, 2008 with  sudden hemoptysis that evening.  He noted small amounts of bright red  blood and a tickle sensation in his throat.  The amount of blood  increased and he came to the emergency room for evaluation.  He was seen  by the ER staff who referred him for  admission due to the moderate  amount of blood that he was producing.  [name] is an ex-smoker  having smoked from his teenage years up to about 66, but never more than  half a pack a day for a 10-15 pack-year history.  He has no known  pulmonary problems except for occasional bronchitic infections.  He  developed an intermittent cough recently and evaluation including a  normal chest x-ray and treatment with antibiotics and antitussives.  His  Altace was changed to Benicar with marked improvement in his cough  symptoms.  Apparently the patient started taking his Altace again and  tickle sensation returned with cough yielding small amount of bright red  blood as noted on the evening  of admission.  He denied chest pain or  shortness of breath.  He could not localize the site of the bleeding  from either upper or lower tracts, right or left side.  There was no  fever, chills or sweats.  Exam was unrevealing except for the bright red  blood that he expectorated.  I should note that he had no abdominal  pain, no nausea, vomiting, no swallowing difficulty, etc.   DISCHARGE PHYSICAL EXAMINATION:  Examination at the time of admission  revealed a 65 year old gentleman in no acute distress.  Blood pressure  130/70, pulse 74 per minute and regular, respirations 18 per minute not  labored, temperature 98.6.  HEENT exam was negative except for blood in  the posterior pharynx.  There was no lymphadenopathy.  No lesions  readily identified.  Neck exam showed no jugular venous distention, no  carotid bruits, no thyromegaly or lymphadenopathy.  Chest exam was clear  to percussion and auscultation.  Cardiac exam revealed regular rhythm.  Normal S1, S2 without murmurs, rubs or gallops heard.  Abdomen was soft  and nontender without evidence of organomegaly or masses.  Extremities  showed no cyanosis, clubbing or edema.  Neurologic exam was intact  without focal abnormalities.  Skin exam was  negative.   LABORATORY DATA:  Chest x-ray in March was clear.  There were no  infiltrates, no spots, no adenopathy.  CT angio performed in the  emergency room was negative without evidence for pulmonary emboli and no  lesions identified in the lung.  CBC showed hemoglobin 13.1, hematocrit  38.6, white count 5100 with a normal differential.  Protime 14.0, INR  1.1, PTT 37 seconds.  Sodium 141, potassium 4.2, chloride 110, CO2 27,  BUN 26, creatinine 0.68, blood sugar 119, bilirubin 0.9, alk phos 42,  SGOT 18, SGPT 17, total protein 5.2, albumin 3.2, calcium 8.2.  BNP 30.  CPK 45 with negative MB and negative troponin.  Sed rate 10.  TSH of  2.16.  PSA 1.10.   HOSPITAL COURSE:  The patient's aspirin and Plavix were held on  admission.  He was continued on his cardiac medications including Coreg  and Benicar.  He was continued on his cholesterol medications including  Crestor, Niaspan, fish oil along with Co-Q 10.  He was given cough  suppression with Tussionex.  Later on the day of admission he was taken  to the bronchoscopy suite where Dr. Sherene Sires performed a bronchoscopy with  surprised finding of absolutely no lesions or abnormalities seen below  the cords.  Indeed there was no blood or evidence of prior bleeding  below the vocal cord level.  It appears as though the bleeding that Mr.  Green had was coming from above the cords and nothing was no visible  during the bronchoscopy.   [name] bleeding stopped the morning after admission.  He had no  further cough or blood produced.  He was felt to be safe for discharge  from observation and will be sent to the ENT office for evaluation of  his upper tract for completeness.  He was advised to stay off of aspirin  and Plavix for 5 days and then reintroduced these one at a time and call  should he notice any recurrent hemoptysis.   MEDICATIONS AT DISCHARGE:  1. Coreg 6.25 mg p.o. b.i.d.  2. Benicar 40 mg p.o. daily.  3. Crestor 10 mg  p.o. daily.  4. Niaspan 500 mg p.o. daily.  5. Fish oil 1200  mg p.o. daily.  6. Co-Q 10 at 50 mg p.o.  7. Caltrate 1 tablet daily.  8. Osteo-Bi-Flex 1 tablet daily.  9. Multivitamin daily.  10.Tussionex 5 mL p.o. q.12 h. p.r.n. cough.   The patient was instructed to hold his previous aspirin and Plavix  therapy for the time being and to discontinue any ACE inhibitors  including Altace from now on.  He will restart the aspirin after 5 days  and restart the Plavix several days later as long as he is tolerating  them well.  He is instructed call the office for follow-up appointment  in 1 week.   CONDITION ON DISCHARGE:  Improved.      Lonzo Cloud. Kriste Basque, MD  Electronically Signed     SMN/MEDQ  D:  09/01/2008  T:  09/01/2008  Job:  [id number]

## 2010-10-02 NOTE — Assessment & Plan Note (Signed)
[facility]                         GASTROENTEROLOGY OFFICE NOTE   ZAIDIN, BLYDEN                       MRN:          [id number]  DATE:09/25/2006                            DOB:          Feb 18, 1946    Mr. 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   I have seen Wood in the past for colon cancer screening which was last  done in August 2004.  He was recommended to have colonoscopy in 5 years  after that time.  He really otherwise has no GI history but for the last  2 months has had rather typical burning substernal chest pain, both  daytime and nighttime, radiating to his throat.  Associated with this  has been some intermittent solid food dysphagia for bread and meats.  He  has used Tums and Pepcid A.C. with mild improvement.  He has not had  barium studies or endoscopic exams.  He denies history of chronic GERD.  He is not a smoker or drinker.  He has had no anorexia, weight loss, had  no biliary complaints or systemic complaints.  He does take aspirin and  Celebrex daily for many years.   PAST MEDICAL HISTORY:  Coronary artery disease, followed by a  cardiologist with placement of stents, and he seems to be doing well  from a cardiovascular standpoint on Plavix and aspirin.  He  additionally, as mentioned above, takes daily Celebrex.   HISTORY OF MEDICAL PROBLEMS:  1. Degenerative arthritis.  2. Hyperlipidemia.  3. Previous repair of bilateral inguinal hernias and a midline ventral      hernia.   MEDICATIONS:  1. Crestor 10 mg daily.  2. CPAP machine q.h.s. for sleep apnea.  3. Celebrex 200 mg daily.  4. Fish oil 1200 mg daily.  5. Glucosamine daily.  6. Aspirin 81 mg daily.  7. Co-enzyme-Q 50 mg daily.  8. Altace 10 mg daily.  9. Coreg 3.125 mg twice daily.  10.Plavix 75 mg daily.  11.Folic acid 400 mg daily.  12.Niaspan 500 mg daily.  13.A variety of  multivitamins.   ALLERGIES:  IN THE PAST HAS HAD REACTIONS TO SULFA MEDICATIONS.   FAMILY HISTORY:  Remarkable for early cardiac disease in his father.  There are no known gastrointestinal problems.   SOCIAL HISTORY:  He is married and lives with his wife.  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  He does not smoke or use ethanol.   REVIEW OF SYSTEMS:  Noncontributory except for some arthritic  complaints.  The occasional cardiac arrhythmias.  He is followed by Dr.  Kriste Basque and Dr. Diona Browner and recently saw Dr. Diona Browner in March 2008.  At  that time, he had an overall low-normal ejection fraction of 50-55%.  He  also apparently was evaluated at [facility].   PHYSICAL EXAMINATION:  GENERAL:  He is a healthy-appearing pleasant  white male in no distress, appearing his stated age.  He is 5 feet 8  inches tall and weighs 182 pounds.  VITAL SIGNS:  Blood pressure  96/64, and pulse was 68 and regular.  I  could not appreciate a stigmata of chronic liver disease, thyromegaly,  or lymphadenopathy.  CHEST:  Clear.  He appeared to be in a regular rhythm without murmurs,  gallops, or rubs at this time.  ABDOMEN:  I could not appreciate hepatosplenomegaly, abdominal masses,  or tenderness.  Bowel sounds were normal.  There was no evidence of an  incisional hernia, straight leg raising.  EXTREMITIES:  Peripheral extremities were unremarkable.  Mental status  was clear.  RECTAL:  Deferred.   ASSESSMENT:  1. Probable hiatal hernia with gastroesophageal reflux disease.  2. 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  3. Coronary artery disease with previous stenting, on aspirin and      Plavix.  4. Chronic nonsteroidal anti-inflammatory drugs use with daily      Celebrex and aspirin therapy - rule out nonsteroidal anti-      inflammatory drugs induced erosive mucosal disease of the upper      gastrointestinal tract.  5. History of sleep apnea with CPAP machine used.  6. History of  hyperlipidemia and hypertension.   RECOMMENDATIONS:  1. I have started Prevacid 30 mg 30 minutes before supper in the      evening since he has an unusual work schedule.  He did see a      patient education film today on acid reflux management.  2. Outpatient endoscopy, probable biopsies and dilation at his      convenience.  3. Continue other medications as per Drs. Kriste Basque and [place].  4. Further workup depending on clinical course.     Vania Rea. Jarold Motto, MD, Caleen Essex, FAGA  Electronically Signed    DRP/MedQ  DD: 09/25/2006  DT: 09/25/2006  Job #: [id number]   cc:   Lonzo Cloud. Kriste Basque, MD  Jonelle Sidle, MD  Genene Churn. Love, M.D.

## 2010-10-02 NOTE — Assessment & Plan Note (Signed)
[facility]                            CARDIOLOGY OFFICE NOTE   [name], [name]                       MRN:          [id number]  DATE:10/22/2007                            DOB:          Aug 16, 1945    PRIMARY CARE PHYSICIAN:  Billy Cloud. Kriste Basque, MD   REASON FOR VISIT:  Followup testing.   HISTORY OF PRESENT ILLNESS:  I saw [name] back in late April.  He  has a history of coronary artery disease status post previous stent  placement to the right coronary artery by Dr. Gerri Green back in 2005.  He has had a number of followup studies over the years including  myocardial perfusion studies, echocardiography, a previous  cardiopulmonary stress test in 2006, and also more recently, a cardiac  MRI through [facility].  His symptom complex over the last several  years has been a sensation of shortness of breath with activity.  When I  last saw him, he continued to indicate problems with shortness of  breath, although was not describing any frank chest discomfort at that  time.  He continued to describe concern about this, particularly when he  would exercise at the gym, feeling that he was having functional  limitation.  It was based on this that we decided to proceed with a  cardiopulmonary stress test with results compared to his previous study  from 2006.  This was performed in early May and interpreted by Dr.  Gala Green.  The patient was described as having a mild functional  limitation, and it was noted that at peak exercise, the patient's oxygen  pulse curve flattened out abruptly, suggesting the possibility of  myocardial ischemia, although the patient was not having any anginal  chest pain, and his electrocardiogram was uninterpretable due to a  resting left bundle branch block pattern.  It is interesting to note  that he had a similar pattern back in 2006, which actually resulted in a  followup cardiac catheterization by Dr. Gerri Green,  demonstrating a  widely patient right coronary artery stent, normal pulmonary artery  pressures, and normal left heart filling pressures.  He was managed  medically at that time.  I discussed these results with him today.   [name] remains very concerned about his symptoms  He tells me now  that he is experiencing some chest tightness with exertion, which he  has not mentioned to me before.  He also states that he had a recent  episode where he felt dizzy when he was exercising, reminiscent of his  original symptoms with cardiovascular disease.  In light of this, we  discussed arranging a diagnostic cardiac catheterization to clearly  define his anatomy and understand whether there are any obstructive  stenoses that might need revascularization.  We discussed the risks and  benefits, and he is in agreement to proceed.  I elected to schedule this  with [name] to have an interventionalist perspective on his  revascularization options and introduce him for regular follow-up going  forward given transition of my office practice to the [place]  offices.  ALLERGIES:  SULFA DRUGS.   PRESENT MEDICATIONS:  1. Multivitamin daily.  2. Celebrex 200 mg p.o. daily.  3. Omega 3 supplements 1200 mg p.o. daily.  4. Glucosamine chondroitin daily.  5. Aspirin 81 mg p.o. daily.  6. Code Q-10 - 50 mg p.o. daily.  7. Altace 10 mg p.o. daily.  8. Coreg 3.25 mg p.o. b.i.d.  9. Plavix 75 mg p.o. daily.  10.Folic acid 400 mg p.o. daily.  11.Niaspan 500 mg p.o. daily.  12.Calcium with vitamin D 500 mg p.o. daily.  13.Flax seed oil 500 mg p.o. daily.  14.Crestor 10 mg p.o. daily.  15.Protonix 40 mg p.o. daily.   REVIEW OF SYSTEMS:  Described in history of present illness, or as  noted.   EXAMINATION:  Blood pressure is 130/72, heart rate is 77, weight is 175  pounds, which is stable.  Patient is comfortable, in no acute distress.  HEENT:  Conjunctivae were normal.  Oropharynx is clear.   NECK:  Supple.  No elevated jugular venous pressure. No carotid bruits.  LUNGS:  Clear without labored breathing.  CARDIAC:  Exam reveals a regular rate and rhythm.  No rub, murmur or  gallop.  ABDOMEN:  Soft, nontender.  Normoactive bowel sounds.  EXTREMITIES:  Show no significant pitting edema.  His pulses are 2+.  SKIN:  Warm and dry.  MUSCULOSKELETAL:  No kyphosis noted.  NEURO/PSYCHIATRIC:  Patient is alert and oriented x3.  Affect is normal.   IMPRESSION/RECOMMENDATIONS:  Progressive shortness of breath with  activity as well as recent description of exertional chest tightness, as  well as an episode of exertional dizziness.  This is in the setting of  known cardiovascular disease, status post previous stent placement to  the right coronary artery approximately five years ago, found to be  patent at angiography in 2006, actually following an abnormal  cardiopulmonary stress test..  We are in much the same boat at this  time.  However, he does describe consistent worsening of his  symptomatology and remains very concerned about his cardiac status.  In  light of this, we discussed arranging a followup diagnostic cardiac  catheterization to best understand his coronary anatomy despite his  other reassuring evaluation of the last few years.  We have reviewed the  risks and benefits of this, and he is in agreement to proceed.  Baseline  laboratory data will be obtained and this will be arranged in our  outpatient catheterization laboratory with [name].  Further plans to  follow.     Billy Sidle, MD  Electronically Signed    SGM/MedQ  DD: 10/22/2007  DT: 10/22/2007  Job #: [id number]

## 2010-10-02 NOTE — Assessment & Plan Note (Signed)
[facility]                             PULMONARY OFFICE NOTE   SUKHDEEP, WIETING                       MRN:          [id number]  DATE:04/10/2007                            DOB:          June 29, 1945    HISTORY OF PRESENT ILLNESS:  The patient is a 65 year old white male  patient of Dr. Jodelle Green who has a known history of hyperlipidemia and  osteoarthritis and presents today for acute office visit.  The patient  complains of a one week history of nasal congestion, sinus pain and  pressure and cough.  The patient denies any hemoptysis, fever, chest  pain, orthopnea, PND, or leg swelling.   PAST MEDICAL HISTORY:  Reviewed.   PHYSICAL EXAMINATION:  GENERAL:  The patient is a pleasant male, in no  acute distress.  VITAL SIGNS:  He is afebrile with stable vital signs.  Oxygen saturation  98% on room air.  HEENT:  Nasal mucosa is erythematous.  Trace maxillary sinus tenderness.  Posterior pharynx is clear.  NECK:  Supple without cervical adenopathy.  LUNGS:  Lung are clear.  CARDIAC:  Regular rate.  ABDOMEN:  Soft and nontender.  EXTREMITIES:  Warm without any edema.   ASSESSMENT/PLAN:  Acute rhinosinusitis.  The patient to get Augmentin  for 10 days.  Mucinex DM twice a day; nasal hygiene regimen.  The  patient instructed to hold fish oil and flax seed oil over the next 10  days.  The patient will return back with Dr. Kriste Basque as scheduled in one  month for full physical examination or sooner if needed.      Rubye Oaks, NP  Electronically Signed      Lonzo Cloud. Kriste Basque, MD  Electronically Signed   TP/MedQ  DD: 04/10/2007  DT: 04/11/2007  Job #: [id number]

## 2010-10-05 NOTE — Discharge Summary (Signed)
NAMEFRUTOSO, DIMARE                            ACCOUNT NO.:  [id number]   MEDICAL RECORD NO.:  [id number]                   PATIENT TYPE:  OIB   LOCATION:  6523                                 FACILITY:  [facility]   PHYSICIAN:  Carole Binning, M.D. [facility]         DATE OF BIRTH:  March 11, 1946   DATE OF ADMISSION:  09/16/2003  DATE OF DISCHARGE:  09/17/2003                                 DISCHARGE SUMMARY   ADMISSION DIAGNOSES:  1. Exertional shortness of breath.  2. Atypical chest pain.  3. Intermittent left bundle branch block.  4. Family history of coronary artery disease.  5. Hyperlipidemia.  6. Arthritis.   DISCHARGE DIAGNOSES:  1. Cardiac catheterization revealing coronary artery disease, status post     right coronary artery percutaneous coronary intervention.  2. Atypical chest pain.  3. Intermittent left bundle branch block.  4. Family history of coronary artery disease.  5. Hyperlipidemia.  6. Arthritis.   HISTORY OF PRESENT ILLNESS:  Mr. Gregson is a 65 year old white male who has  a strong family history of coronary artery disease and a personal history of  hyperlipidemia.  Apparently he had an echocardiogram back in 2002, as well  as a stress Cardiolite, which were essentially normal.   Recently he had been complaining of exertional shortness of breath, but no  chest pain.  He has had some atypical chest discomfort, which is not  exertional.  During a recent surgical procedure at [facility], the  patient developed intermittent left bundle branch block.  He was advised to  follow up with his cardiologist.   He was seen in the office on September 12, 2003, by Dr. Gerri Spore and an EKG  showed sinus rhythm with T-wave inversions in the anterolateral and inferior  leads.  These were not seen on prior EKGs.   Dr. Gerri Spore recommended proceeding with diagnostic cardiac catheterization  to rule out coronary artery disease.  The patient was agreeable.   PROCEDURES:   Cardiac catheterization on August 16, 2003, by Dr. Gerri Spore  with intervention to the RCA.   COMPLICATIONS:  None.   CONSULTANTS:  None.   COURSE IN THE HOSPITAL:  Mr. Laughter was admitted to [facility] for  elective diagnostic cardiac catheterization on August 16, 2003, by Dr.  Gerri Spore.  This revealed essentially single-vessel CAD with an 80% proximal  RCA lesion.  This was confirmed by IVUS.  Of note, during IVUS  investigation, the patient developed bradycardia with chest pain and ST  elevation and IVUS was aborted.  Symptoms resolved.  The patient then  underwent angioplasty with stenting to the RCA, placing a 3.5 x 13 Cypher  stent.  The patient tolerated the procedure well.  IVUS was performed and  suggested that the patient needed additional balloon inflations.  This was  performed and the result was reduction from an 80% lesion to 0% with TIMI-3  flow.  Of note, there  was a very small RV branch occlusion.  Integrillin was  bolused and infused for 18 hours and Plavix was recommended for 12 months.   Post procedure, the patient remained stable.  EKG showed a left bundle  branch block.   On September 17, 2003, the patient remained stable and he ambulated without  difficulty.  He did have normal cardiac enzymes post procedure.  Sodium 136,  potassium 3.6, BUN 7, creatinine 0.9, and hemoglobin 11.   On exam, the right groin is with minimal ecchymosis and no bruit and distal  pulses are intact.  He was seen by Dr. Andee Lineman who agreed with discharge  today.   DISCHARGE MEDICATIONS:  1. Enteric-coated aspirin 325 mg a day.  2. Plavix 75 mg a day for a year.  3. Crestor 10 mg at night (fasting lipid profile is pending).  4. Celebrex 200 mg a day.  5. Nitroglycerin 0.4 mg one under the tongue every five minutes as needed     for chest pain.   ACTIVITY:  No strenuous activity, lifting more than 5 pounds, or driving for  48 hours.   DIET:  Low-fat, low-cholesterol, low-salt  diet.   WOUND CARE:  May shower.  Should not soak in the tub or swim for a week.   FOLLOWUP:  He will follow up with Dr. Gerri Spore in two weeks and the office  will call him with an appointment.  He is given prescriptions for Plavix,  Crestor, and nitroglycerin today.      Georgiann Cocker Jernejcic, P.A.                   Carole Binning, M.D. [facility]    TCJ/MEDQ  D:  09/17/2003  T:  09/17/2003  Job:  [id number]   cc:   Lonzo Cloud. Kriste Basque, M.D. [facility]

## 2010-10-05 NOTE — Assessment & Plan Note (Signed)
[facility]                              CARDIOLOGY OFFICE NOTE   [name], [name]                       MRN:          [id number]  DATE:02/05/2006                            DOB:          1945/08/29    PRIMARY CARE PHYSICIAN:  Lonzo Cloud. Kriste Basque, MD   REASON FOR VISIT:  Cardiac followup.   HISTORY OF PRESENT ILLNESS:  [name] returns to the office today.  He  states that he is doing reasonably well.  He continues to exercise  regularly, although complains that he has not lost any weight as yet.  We  talked about his diet, portion control and lightly increasing his intensity  level and perhaps duration on the treadmill.  His electrocardiogram is  stable showing a left bundle branch block pattern.  He states that he had a  cardiac CT scan through an evaluation at [facility] approximately 6 months ago and  will send me the study results.  He tells me that these were reassuring.  His recent blood work indicates normal liver function tests and excellent  LDL control at 62.  His HDL remains low at 29 despite his medical regimen  and exercise.  He is not reporting any claudication symptoms, limiting  dyspnea on exertion, orthopnea or PND.   ALLERGIES:  SULFA drugs.   PRESENT MEDICATIONS:  1. Celebrex 200 mg p.o. daily.  2. Crestor 10 mg p.o. daily.  3. Altace 10 mg p.o. daily.  4. Coreg 3.125 mg p.o. b.i.d.  5. Niaspan 5 mg p.o. daily.  6. Plavix 75 mg p.o. daily.  7. Aspirin 81 mg p.o. daily.  8. Multivitamins.  9. __________ tablets 1200 mg p.o. daily.  10.Glucosamine chondroitin.  11.Folic acid.   REVIEW OF SYSTEMS:  As described in the history of present illness,  otherwise negative.   PHYSICAL EXAMINATION:  VITAL SIGNS:  Blood pressure today is 120/76, heart  rate is 85, weight is 183 pounds.  The patient is comfortable in no acute  distress.  NECK:  Reveals no jugular venous pressure without bruits.  No thyromegaly is  noted.  LUNGS:   Clear without labored breathing.  CARDIAC EXAM:  Reveals a regular rate and rhythm without murmurs or gallops.  EXTREMITIES:  __________ no pitting edema.   IMPRESSION AND RECOMMENDATIONS:  1. Coronary disease with a left ventricular ejection fraction of 45-50%      and prior drug-eluting stent placement to the right coronary artery in      April 2005.  He has had reassuring followup ischemic testing.  He does      tell me that he is due to have arthroscopic knee surgery as well as      removal of a ganglion cyst from one of his fingers. I think that it      would be reasonable for him to hold aspirin and Plavix as needed by his      surgeons, temporarily.  He is greater than 2-years out from his stent      placement.  We talked about issues with late stent  thrombosis; and I      would generally recommend that he start back on aspirin and Plavix as      soon after surgery as reasonably possible from a bleeding perspective.      I will otherwise plan to see him back over the next 6 months for      symptom review.  2. Hyperlipidemia, fairly well controlled.  ACL remains low; however, he      is on a good medical regimen and continues to exercise.                                Jonelle Sidle, MD    SGM/MedQ  DD:  02/05/2006  DT:  02/06/2006  Job #:  [id number]   cc:   Lonzo Cloud. Kriste Basque, MD

## 2010-10-05 NOTE — Assessment & Plan Note (Signed)
[facility]                            CARDIOLOGY OFFICE NOTE   TOMER, CHALMERS                       MRN:          [id number]  DATE:07/22/2006                            DOB:          1946-03-15    PRIMARY CARE PHYSICIAN:  Lonzo Cloud. Kriste Basque, M.D.   REASON FOR VISIT:  Routine cardiac followup.   HISTORY OF PRESENT ILLNESS:  I saw Mr. Crace last in September.  He  continues to remain stable.  He has chronic dyspnea on exertion but only  with increased work load such as carrying a heavy bag of fertilizer  across his yard.  Otherwise, he is exercising 5 days a week at the gym  and is having no limiting dyspnea and no chest pain consistently with  exertion.  His electrocardiogram today shows sinus rhythm with a left  bundle-branch block pattern.  This is old in comparison to his prior  tracing in September.  He did undergo a cardiac MRI at [facility] back in the  summer of 2007 which was negative for ischemia and we have not pursued  any additional ischemic evaluation as yet.  Overall, we discussed a  number of health maintenance issues today and I answered some questions  he had in this regard.  I have recommended that we continue medical  therapy and observation unless he develops progressive symptoms.  His  medical regimen at this point is quite good.   ALLERGIES:  SULFA DRUGS.   PRESENT MEDICATIONS:  1. Multivitamin one p.o. daily.  2. Vitamin B12 supplements.  3. Crestor 10 mg p.o. daily.  4. CPAP in the evenings.  5. Celebrex 200 mg p.o. daily.  6. Fish oil supplements.  7. Glucosamine/chondroitin as directed.  8. Niaspan 500 mg p.o. daily.  9. Enteric-coated aspirin 81 mg p.o. daily.  10.CoQ10.  11.Altace 10 mg p.o. daily  12.Coreg 3.125 mg p.o. b.i.d.  13.Plavix 75 mg p.o. daily.  14.Folic acid supplements.   REVIEW OF SYSTEMS:  As described in the history of present illness.   EXAMINATION:  VITAL SIGNS:  Blood pressure 121/76, heart  rate 78.  GENERAL:  The patient is comfortable in no acute distress.  NECK:  Reveals no elevated jugular venous pressure, no loud carotid  bruits, no thyromegaly.  LUNGS:  Clear without labored breathing.  CARDIAC:  Reveals a regular rate and rhythm without loud murmur or  gallop.  ABDOMEN:  Soft without bruit.  EXTREMITIES:  Show no significant pitting edema.  SKIN:  Warm and dry.   IMPRESSION AND RECOMMENDATION:  1. Known coronary artery disease with overall low-normal ejection      fraction of 50-55% based on most recent evaluation/testing at [facility] back in the summer of 2007.  At that time he had no      inducible ischemia by adenosine MRI and continued evidence of a      left bundle-branch block pattern.  We will plan to continue medical      therapy and observation.  He is comfortable with this.  I will plan  to see him back over the next 6 months unless he develops any      progressive symptoms in the interim.  He tells me that he may be in      need of a knee replacement in the future.  Depending on the timing      of this, we may decide when it is appropriate to consider repeat      ischemic testing.  2. Hyperlipidemia, on a good medical regimen.  Last evaluation      revealed an LDL of 62 in September with normal liver function      tests.     Jonelle Sidle, MD  Electronically Signed    SGM/MedQ  DD: 07/22/2006  DT: 07/23/2006  Job #: [id number]

## 2010-10-05 NOTE — Cardiovascular Report (Signed)
NAMESHEDRICK, SARLI                            ACCOUNT NO.:  [id number]   MEDICAL RECORD NO.:  [id number]                   PATIENT TYPE:  OIB   LOCATION:  6523                                 FACILITY:  [facility]   PHYSICIAN:  Carole Binning, M.D. [facility]         DATE OF BIRTH:  Feb 21, 1946   DATE OF PROCEDURE:  09/16/2003  DATE OF DISCHARGE:  09/17/2003                              CARDIAC CATHETERIZATION   PROCEDURE PERFORMED:  1. Left heart catheterization with coronary angiography and left     ventriculography.  2. Intravascular ultrasound of the right coronary artery.  3. Percutaneous transluminal coronary angioplasty with placement of a drug-     eluting stent in the proximal right coronary artery utilizing     intravascular ultrasound guidance.   CARDIOLOGIST:  Carole Binning, M.D.   INDICATIONS:  Mr. Dunsworth is a 65 year old male with a history of  hyperlipidemia and strong family history of premature coronary artery  disease.  He has had progressive exertional dyspnea.  He presented after a  recent EKG showed new left bundle branch block and a followup EKG showed  anterior T-wave changes worrisome for ischemia.  We therefore proceeded with  cardiac catheterization.   DESCRIPTION OF PROCEDURE:  A 6 French sheath was placed in the right femoral  artery.  Coronary angiography was performed with standard 6 Judkins 6 French  catheters. Left ventriculography was performed with an angled pigtail  catheter.   CONTRAST:  Contrast was Omnipaque.   COMPLICATIONS:  There were no complications.   RESULTS:  HEMODYNAMICS:  Left ventricular pressure 135/13. Aortic pressure  138/84.  There was no aortic valve gradient.   LEFT VENTRICULOGRAM:  Wall motion is normal.  Ejection fraction is estimated  at 60%.  There is no mitral regurgitation.   CORONARY ARTERIOGRAPHY:  (Right dominant).   Left main is normal.  Left anterior descending coronary artery has a diffuse 20% stenosis in  the  proximal mid vessel.  The LAD gives rise to a small first diagonal and a  small to normal size second diagonal.   The left circumflex has a tubular 25% stenosis in the proximal to mid  vessel.  The circumflex gives rise to a small ramus intermedius, small first  obtuse marginal, a very large branching second obtuse marginal and a small  third obtuse marginal.   The right coronary artery is a dominant vessel.  There is a 20% stenosis in  the proximal vessel followed by a tubular 80% stenosis in the proximal  vessel.  Just beyond this in the mid vessel there is a 25% stenosis.  The  distal right coronary artery gives rise to a large posterior descending  artery and a normal size bifurcating posterior lateral branch.   IMPRESSION:  1. Normal left ventricular systolic function.  2. One vessel coronary artery disease characterized by an 80% stenosis in     the proximal right coronary  artery.   PLAN:  Intravascular ultrasound to further quantify the effusion followed by  percutaneous coronary intervention if indicated.   PERCUTANEOUS TRANSLUMINAL CORONARY ANGIOPLASTY PROCEDURE:  Following  completion of the diagnostic catheterization, we elected to proceed with  intravascular ultrasound.  We utilized the preexisting 6 French sheath in  the right femoral artery.  Heparin was administered to maintain an ACT of  greater than 200 seconds.  We used a 6 [place] JR4 guiding catheter.  A BMW  wire was then passed under fluoroscopic guidance to a distal right coronary  artery.  An Atlantis pro intravascular ultrasound catheter was advanced over  the wire.  As we were beginning to obtain intravascular ultrasound images,  the patient developed bradycardia, heart block, hypotension and chest pain.  We therefore quickly removed the intravascular ultrasound catheter.  Intravenous Atropine and fluids were administered.  Ultimately, the  patient's heart rate returned to normal and blood pressure  returned to  normal.  Chest pain resolved.  It was apparent that this clearly was a  hemodynamically significant lesion and hence the patient did become ischemic  with the IVUS catheter in the coronary.  We therefore proceeded with  percutaneous coronary intervention.  We positioned a 3.5 x 13 mm Cypher drug-  eluting stent across the area of stenosis.  The stent was deployed at 18  atmospheres.  We then went in with a 4.0 x 8 mm Quantum balloon and  positioned it in the mid portion of the stent and inflated it to 14  atmospheres and the proximal portion of the stent inflating it up to 18  atmospheres.  We again performed intravascular ultrasound which showed the  distal edge of the stent was well sized and well deployed fully against the  vessel wall.  The mid portion of the stent also looked adequate.  However,  it had very proximal struts in the stent and did not appear to be fully  opposed to the vessel wall.  We therefore went back in with a 4.5 x 8 mm  Quantum balloon in the very proximal edge of the stent and inflated this  balloon to 16 atmospheres.  Intermittent doses of nitroglycerin were  obtained.  Final angiographic images were then obtained revealing patency of  the right coronary artery with 0% residual stenosis at the stent site and  TIMI-3 flow.  By angiography, the stent appeared to be well deployed with  both a step up in the proximal edge of the stent and a step down beyond the  distal edge of the stent.   COMPLICATIONS:  None.   RESULTS:  Successful placement of a drug-eluting stent in the proximal right  coronary artery utilizing intravascular ultrasound guidance.  A tubular 80%  stenosis was reduced to 0% residual with TIMI-3 flow.   PLAN:  Integrilin will be continued for 18 hours.  The patient will be  treated with Plavix for recommended 12 months.                                               Carole Binning, M.D. [facility]   MWP/MEDQ  D:  09/16/2003  T:   09/17/2003  Job:  [id number]   cc:   Lonzo Cloud. Kriste Basque, M.D. [facility]   Cardiac Catheterization Lab

## 2010-10-05 NOTE — Cardiovascular Report (Signed)
[name], [name]                ACCOUNT NO.:  [id number]   MEDICAL RECORD NO.:  [id number]          PATIENT TYPE:  OIB   LOCATION:  2899                         FACILITY:  [facility]   PHYSICIAN:  Carole Binning, M.D. LHCDATE OF BIRTH:  04/04/1946   DATE OF PROCEDURE:  DATE OF DISCHARGE:                              CARDIAC CATHETERIZATION   PROCEDURE PERFORMED:  Right and left heart catheterization with coronary  angiography and left ventriculography.   INDICATION:  [name] is a 65 year old male with a history of coronary  disease.  He underwent placement of a drug-eluting stent in the proximal  right coronary, approximately 11 months ago.  He has had symptoms of  progressive exertional dyspnea. A stress nuclear scan showed no evidence of  ischemia but a decrease in his ejection fraction from previously.  An  echocardiogram also confirmed a mild decrease in ejection fraction.  We  proceeded with cardiopulmonary stress testing. It was felt that his dyspnea  was potentially ischemic in etiology.  We, therefore, opted to proceed with  cardiac catheterization to assess his hemodynamics and coronary anatomy.   PROCEDURE NOTE:  A 7-French sheath was placed in the right femoral vein, a 6-  French sheath in the right femoral artery.  Right heart catheterization was  performed with a Swan-Ganz catheter.  Left ventriculography was performed  with an angled pigtail catheter.  Coronary angiography was performed with  standard Judkins 6-French catheters.  Contrast was Omnipaque. There are no  complications.   RESULTS:   HEMODYNAMICS:  1.  Right atrial mean pressure of 5.  2.  Right ventricular pressure 25/6.  3.  Pulmonary pressure 23/10, pulmonary capillary wedge mean pressure 10.  4.  Left ventricular pressure 126/8.  5.  Aortic pressure 136/80. There is no gradient across the aortic valve on      catheter pullback.  6.  In addition, there was no significant gradient measured  between the      pulmonary capillary wedge pressure and left ventricular end diastolic      pressure.   Cardiac output by thermal dilution method is 5.5.  Cardiac index 2.8.  Cardiac output by the Fick method is 7.4.  Cardiac index 3.8.   LEFT VENTRICULOGRAM:  There is mild global hypokinesis of the left  ventricle.  Ejection fraction is estimated at 45-50%.  There is trace mitral  regurgitation.   CORONARY ANGIOGRAPHY:  1.  The left main is normal.  2.  Left anterior descending artery has a 20% stenosis in the mid vessel.      The LAD is otherwise normal giving rise to two small diagonal branches.  3.  Left circumflex has a tubular 20% stenosis in the mid vessel.  The      circumflex is otherwise normal giving rise to a small ramus intermedius,      a large branching first obtuse marginal, and a small second obtuse      marginal branch.  4.  Right coronary artery is a dominant vessel.  There is a stent in the      proximal  right coronary artery which is widely patent with 0% stenosis      within the stent.  There is 20% stenosis proximal and distal to the      stent.  The right coronary artery is otherwise normal giving rise to a      large posterior descending artery and a normal sized posterolateral      branch.   IMPRESSION:  1.  Normal right and left heart filling pressures with normal pulmonary      artery pressures.  2.  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  3.  Patent stent in the right coronary artery with no significant residual      coronary disease identified.   PLAN:  Is for medical therapy.      MWP/MEDQ  D:  08/15/2004  T:  08/15/2004  Job:  [id number]   cc:   [name]. [name], M.D. [name]   Carole Binning, M.D. [facility]   Cardiac Cath Lab

## 2010-10-05 NOTE — Op Note (Signed)
NAMEJONN, [name]                ACCOUNT NO.:  [id number]   MEDICAL RECORD NO.:  [id number]          PATIENT TYPE:  AMB   LOCATION:  DAY                          FACILITY:  [facility]   PHYSICIAN:  Madlyn Frankel. Charlann Boxer, M.D.  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   POSTOPERATIVE DIAGNOSIS/FINDINGS:  1. There was noted to be grade IV changes to the patellofemoral joint      mainly in the lateral facette inferiorly as well as on the lateral      trochlea.  2. He had also a degenerative horizontal tear noted to the posterior horn      mid body junction of the medial meniscus as well as some synovitis      anteriorly.   PROCEDURES:  Right knee diagnostic and operative arthroscopy with a  1. Patellofemoral chondroplasty.  2. Partial medial meniscectomy.  3. Anterior limited synovectomy.   SURGEON:  Madlyn Frankel. Charlann Boxer, M.D.   ASSISTANT:  None.   ANESTHESIA:  General plus local.   COMPLICATIONS:  None.   INDICATIONS:  [name] is a 65 year old gentleman who I have been  following for right knee discomfort for some time we tried conservative  measures in terms of managing his condition with cortisone medicines  including Celebrex.  He has failed to respond to any treatment. On exam he  had some associated mechanical complaints and concerns with medial pain as  well as anteriorly.  Rather than proceeding with an MRI in this 65 year old  gentleman, we felt that knee arthroscopy with be the best way to diagnose as  well as to manage any conditions inside the knee.  Risks and benefits were  discussed.  Issues regarding his medication including aspirin and Plavix  were all reviewed.   PROCEDURE IN DETAIL:  The patient was brought to the operative theater.  Once adequate anesthesia and preoperative  antibiotics, 1 g of Ancef, were  administered the patient was positioned supine with the right leg in a leg  holder.  The right lower extremity was then prepped and draped in sterile  fashion.  Standard inferior medial, superior medial and inferior lateral  portals were utilized.  Diagnostic evaluation of the knee was carried out.  The above-noted findings were appreciated other than the patellofemoral  changes which required synovectomy for exposure.  Attention was first  directed to the medial compartment where probe examination revealed  penetration of a cleavage tear.   This was debrided using the biting basket and shaved and contoured with a  3.5 Cuda shaver.  Following this, the lateral compartment was examined.  ACL  intact, lateral compartment intact.  An anterior synovectomy was carried out  that revealed quite significant patellofemoral changes along the trochlea  and lateral trochlear surface with grade III changes noted over the central  trochlea and grade IV changes over the lateral trochlea.  This matched and  was contiguous with some  lateral facet changes.  The patella appeared to be  somewhat laterally subluxed but no lateral release carried out as this was  not part of the informed consent and in this 65 year old gentleman with  these changes probably not going to provide longstanding relief.   Following this, the knee was reexamined, suctioned, drained, instrumentation  was removed.  Portals reapproximated using 4-0 nylon.  The knee was injected  preoperatively at the portal sites with lidocaine and then intra-articularly  with Marcaine at the end.   The knee was then cleaned, dried and dressed sterilely with Adaptic dressing  sponges and bulky dressing.  He was brought to the recovery room extubated  in stable condition.      Madlyn Frankel Charlann Boxer, M.D.  Electronically Signed     MDO/MEDQ  D:  02/13/2006  T:  02/14/2006  Job:  [id number]

## 2010-10-19 HISTORY — PX: OTHER SURGICAL HISTORY: SHX169

## 2010-11-13 ENCOUNTER — Telehealth: Payer: Self-pay | Admitting: Cardiovascular Disease

## 2010-11-13 DIAGNOSIS — I2510 Atherosclerotic heart disease of native coronary artery without angina pectoris: Secondary | ICD-10-CM

## 2010-11-13 MED ORDER — CLOPIDOGREL BISULFATE 75 MG PO TABS: 75.0000 mg | ORAL_TABLET | Freq: Every day | ORAL | Status: DC

## 2010-11-13 NOTE — Telephone Encounter (Signed)
Spoke with pt and will send prescription for clopidogrel 75 mg by mouth daily to [facility].

## 2010-11-13 NOTE — Telephone Encounter (Signed)
Pt wants to know should he continue to take  plavix or gentric.  [facility] mail order.

## 2010-11-15 ENCOUNTER — Encounter: Payer: Self-pay | Admitting: Cardiovascular Disease

## 2010-12-12 ENCOUNTER — Telehealth: Payer: Self-pay | Admitting: Cardiovascular Disease

## 2010-12-12 NOTE — Telephone Encounter (Signed)
Pt. States is getting a life insurance, the insurance company wants to know pt's diagnosis and treatment, to see if he able to get the insurance. Pt would like for Dr. Excell Seltzer to write a note stating  diagnosis and treatment. Pt. Will wait until his O/V on 12/31/10 pt get his note from Dr. Excell Seltzer.

## 2010-12-12 NOTE — Telephone Encounter (Signed)
Per pt call. Pt needs to ask nurse question having to do with life insurance application. Please return pt call to advise/discuss.

## 2010-12-31 ENCOUNTER — Ambulatory Visit (INDEPENDENT_AMBULATORY_CARE_PROVIDER_SITE_OTHER): Payer: Medicare Other | Admitting: Cardiovascular Disease

## 2010-12-31 ENCOUNTER — Encounter: Payer: Self-pay | Admitting: Cardiovascular Disease

## 2010-12-31 VITALS — BP 136/76 | HR 74 | Resp 12 | Ht 68.0 in | Wt 179.0 lb

## 2010-12-31 DIAGNOSIS — E785 Hyperlipidemia, unspecified: Secondary | ICD-10-CM

## 2010-12-31 DIAGNOSIS — I10 Essential (primary) hypertension: Secondary | ICD-10-CM

## 2010-12-31 DIAGNOSIS — I2510 Atherosclerotic heart disease of native coronary artery without angina pectoris: Secondary | ICD-10-CM

## 2010-12-31 NOTE — Patient Instructions (Signed)
Your physician wants you to follow-up in:  6 months. You will receive a reminder letter in the mail two months in advance. If you don't receive a letter, please call our office to schedule the follow-up appointment.   

## 2010-12-31 NOTE — Assessment & Plan Note (Signed)
Lipids are at goal with an LDL less than 70. These have been checked within the past 6 months and were reviewed today. The patient's HDL cholesterol is low he continues on a combination of niacin and Crestor.

## 2010-12-31 NOTE — Assessment & Plan Note (Signed)
Blood pressure is well controlled on a combination of carvedilol and Diovan. Continue current medical therapy without changes.

## 2010-12-31 NOTE — Progress Notes (Signed)
HPI:  [name] returns for followup evaluation. He is a 65 year old gentleman with coronary artery disease status post stenting of the right coronary artery in 2005. He was treated with a drug-eluting stent platform. The patient has chronic left bundle branch block on EKG. He's had preserved LV function and no symptoms of congestive heart failure. He continues to do well from a symptomatic standpoint. He exercises regularly at the gym 4-5 times per week. He had a single episode of severe cramping pain in the right upper chest region and it lasted for about 2 minutes and this was self-limited. He's had no symptoms with exertion. The patient has stable, mild dyspnea with exertion. He denies leg swelling, palpitations, or syncope.  Outpatient Encounter Prescriptions as of 12/31/2010  Medication Sig Dispense Refill  . Alum & Mag Hydroxide-Simeth (MAGIC MOUTHWASH) SOLN Take by mouth. 1 tsp gargle and swallow up to 4 times daily as needed       . aspirin (ADULT ASPIRIN EC LOW STRENGTH) 81 MG EC tablet Take 81 mg by mouth daily.        Marland Kitchen b complex vitamins tablet Take 1 tablet by mouth daily.        . carvedilol (COREG) 12.5 MG tablet Take 12.5 mg by mouth 2 (two) times daily with a meal.        . celecoxib (CELEBREX) 100 MG capsule Take 100 mg by mouth daily.        . clopidogrel (PLAVIX) 75 MG tablet Take 1 tablet (75 mg total) by mouth daily.  90 tablet  3  . Coenzyme Q10 (CO Q 10) 100 MG CAPS Take 1 capsule by mouth daily.        . CRESTOR 10 MG tablet TAKE 1 TABLET DAILY  90 tablet  2  . fish oil-omega-3 fatty acids 1000 MG capsule Take 2 g by mouth daily.        . Flaxseed, Linseed, (FLAX SEED OIL) 1000 MG CAPS Take 1 capsule by mouth daily.        . folic acid (FOLVITE) 400 MCG tablet Take 400 mcg by mouth daily.        . Glucosamine-Chondroitin 1500-1200 MG/30ML LIQD Take 1 tablet by mouth daily.        . Multiple Vitamin (MULTIVITAMIN) capsule Take 1 capsule by mouth daily.        . niacin  (SLO-NIACIN) 500 MG tablet Take 500 mg by mouth daily.        . Testosterone (AXIRON) 30 MG/ACT SOLN Place onto the skin as directed.        . valsartan (DIOVAN) 160 MG tablet Take 160 mg by mouth daily.        Marland Kitchen DISCONTD: amoxicillin-clavulanate (AUGMENTIN) 875-125 MG per tablet Take 1 tablet by mouth 2 (two) times daily.          Allergies  Allergen Reactions  . Sulfamethoxazole W/Trimethoprim     REACTION: pt states allergic to SULFA drugs/ Rash  . Sulfonamide Derivatives     REACTION: rash    Past Medical History  Diagnosis Date  . HTN (hypertension)   . CAD (coronary artery disease)   . Hyperlipidemia   . Allergic rhinitis   . OSA (obstructive sleep apnea)   . Asthmatic bronchitis   . Hemoptysis   . Hiatal hernia   . GERD (gastroesophageal reflux disease)   . Esophageal stricture   . 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 History of colonic polyps     hyperplastic  . Hypertrophy of prostate with urinary obstruction and other lower urinary tract symptoms (LUTS)   . Testicular hypofunction   . OA (osteoarthritis)   . Chronic back pain     intermittent  . Dizziness   . Hx of colonoscopy     ROS: Negative except as per HPI  BP 136/76  Pulse 74  Resp 12  Ht 5\' 8"  (1.727 m)  Wt 179 lb (81.194 kg)  BMI 27.22 kg/m2  PHYSICAL EXAM: Pt is alert and oriented, NAD HEENT: normal Neck: JVP - normal, carotids 2+= without bruits Lungs: CTA bilaterally CV: RRR without murmur or gallop Abd: soft, NT, Positive BS, no hepatomegaly Ext: no C/C/E, distal pulses intact and equal Skin: warm/dry no rash  EKG:  Sinus rhythm with first degree AV block, left bundle branch block, heart rate 74 beats per minute.  ASSESSMENT AND PLAN:

## 2010-12-31 NOTE — Assessment & Plan Note (Signed)
The patient is stable without angina. He continues on a medical regimen with dual antiplatelet therapy using aspirin and Plavix. We discussed the risk/benefit for continuing Plavix and have decided that with his low bleeding risk we will continue both medications. His last cardiac catheterization was in 2009 and this demonstrated wide patency of his right coronary artery stent and mild nonobstructive disease elsewhere. The patient will followup in 6 months.

## 2011-01-07 ENCOUNTER — Ambulatory Visit (INDEPENDENT_AMBULATORY_CARE_PROVIDER_SITE_OTHER): Payer: Medicare Other | Admitting: Pulmonary Disease

## 2011-01-07 ENCOUNTER — Encounter: Payer: Self-pay | Admitting: Pulmonary Disease

## 2011-01-07 DIAGNOSIS — M1990 Unspecified osteoarthritis, unspecified site: Secondary | ICD-10-CM

## 2011-01-07 DIAGNOSIS — K219 Gastro-esophageal reflux disease without esophagitis: Secondary | ICD-10-CM

## 2011-01-07 DIAGNOSIS — I10 Essential (primary) hypertension: Secondary | ICD-10-CM

## 2011-01-07 DIAGNOSIS — D126 Benign neoplasm of colon, unspecified: Secondary | ICD-10-CM

## 2011-01-07 DIAGNOSIS — K449 Diaphragmatic hernia without obstruction or gangrene: Secondary | ICD-10-CM

## 2011-01-07 DIAGNOSIS — E785 Hyperlipidemia, unspecified: Secondary | ICD-10-CM

## 2011-01-07 DIAGNOSIS — N400 Enlarged prostate without lower urinary tract symptoms: Secondary | ICD-10-CM

## 2011-01-07 DIAGNOSIS — G4733 Obstructive sleep apnea (adult) (pediatric): Secondary | ICD-10-CM

## 2011-01-07 DIAGNOSIS — I2510 Atherosclerotic heart disease of native coronary artery without angina pectoris: Secondary | ICD-10-CM

## 2011-01-07 DIAGNOSIS — E291 Testicular hypofunction: Secondary | ICD-10-CM

## 2011-01-07 MED ORDER — ETODOLAC 400 MG PO TABS: 400.0000 mg | ORAL_TABLET | Freq: Two times a day (BID) | ORAL | Status: DC | PRN

## 2011-01-07 MED ORDER — LOSARTAN POTASSIUM 100 MG PO TABS: 100.0000 mg | ORAL_TABLET | Freq: Every day | ORAL | Status: DC

## 2011-01-07 NOTE — Progress Notes (Signed)
Subjective:    Patient ID: [name], male    DOB: 02/26/1946, 64 y.o.   MRN: [id number]  HPI 65 y/o WM here for a follow up visit... he has multiple medical problems as noted below...    ~  December 20, 2009:  generally stable overall- notes some nocturnal CTS symptoms but doesn't want to use wrist splints; exercising daily w/ treadmill, etc- notes tired & SOB after 45 min work out or if taking stairs... OSA stable on CPAP; BP controlled on meds & denies angina, palpit, etc; Lipids stable on Cres10, Niacin, FishOil; denies any GI concerns at present; saw DrTannenbaum recently w/ PSA reported at 1.3 but found "Low-T" & planning more tests; still taking Celebrex regularly for his DJD; requests refill MMW for Prn use...   ~  June 18, 2010:  he's had a good 52mo> still c/o bilat CTS symtpms in the AM & he will try the wrist splints Qhs...  he was started on AXIRON topical testosterone Rx per DrTannenbaum & improved... he saw DrCooper 8/11 & stable from the CV standpoint- no changes made... he was sent to [facility] by his dentist for ?sinus ?LN in neck- nothing pathological found & told to watch it (exam today= neg no node palp)... he will ret for Fasting blood work...  ~  January 07, 2011:  61mo ROV & he has been doing well overall but has several requests today> wants generic replacement for Diovan (we discussed Losartan); and wants referral to DrDeveshwar for Rheum eval, plus cheaper Celebrex (we discussed Etodolac)...    His BP is well controlled on Coreg & Diovan> measures 132/82 today & he remains asymptomatic;  FLP last done 2/12 & looked great w/ LDL 51 on Crestor10, but HDL low at 26 despite Niacin & fish oil rx> rec continue same meds, low fat diet, & incr exercise)...    He continues to f/u w/ DrCooper for Cards every 31mo- seen last wk> CAD, s/p stent RCA 2005, LBBB; last cath was 2009 w/ patent RCA stent & mild non-obstructive dis elsewhere; doing well, exercising regularly, no CP/ palpit/  SOB/ edema/ etc; rec to continue ASA/ Plavix,     He saw DrTannenbaum 6/12 for Urology f/u> BPH, Hypogonad- on AXIRON w/ incr energy, better appetite, sl wt gain; rec to continue same Rx...    Finally he reports some type of ENT/ Oral surg 6wks ago> referred by his dentis to [facility] w/ surg thru gums to remove a growth in his sinus= ?dermoid...          PROBLEM LIST:  OBSTRUCTIVE SLEEP APNEA - Hx OSA w/ sleep study 11/02 RDI=18 and desat to 85%; seen by DrClance and on CPAP rx;  he's been using it regularly, denies daytime hypersomnolence, etc;  He gets machine check & new mask Q41mo thru [employer] & he reports download showed 12,000 hrs use! ~  CXR 3/10 clear w/ min left basilar scarring... ~  Pt gets his CPAP machine checked & masks replaced every 31mo thru [place]...  Episode of HEMOPTYSIS - Hosp 4/10 after episode of BRB hemoptysis> full eval was neg, no recurrence... he had bronchoscopy- neg,  ENT eval DrRosen- neg, noted some persistant cough treated w/ Mucinex DM, Zyrtek, Tussionex...  no recurrence.  HBP & CAD-S/P stent in RCA on 2005 - followed regularly by DrCooper on ASA 81mg /d,  PLAVIX 75mg /d,  COREG 12.5mg Bid,  DIOVAN 160mg /d...  BP= 132/82 & similar at home> denies HA, fatigue, visual changes, CP, palipit,  dizziness, syncope, dyspnea, edema, etc...  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  HYPERLIPIDEMIA - he follows a low fat diet and takes CRESTOR 10mg /d, NIACIN 500mg /d, Fish Oil, Flax Seed Oil, & CoQ10... ~  FLP 9/08 showed TChol 110, TG 82, HDL 28, LDL 66 ~  FLP  8/09 showed TChol 177, TG 130, HDL 36, LDL 115 ~  FLP 1/10 showed TChol 114, TG 109, HDL 31, LDL 62 ~  FLP 2/11 showed TChol 103, TG 85, HDL 36, LDL 51 ~  FLP 2/12 showed TChol 101, TG 121, HDL 26, LDL 51  HIATAL HERNIA, Hx of ESOPHAGEAL STRICTURE, OTHER SPECIFIED DISORDER OF STOMACH AND DUODENUM (ICD-537.89) >> he gets the [facility], along w/ the similar product from [employer]- "I read too much"- he stopped PPI/ Dexilant Rx in favor of "Vinegar & Honey- it works as well per the [employer]"...  ~  he had an EGD 6/08 showing a 3 cm HH & distal esoph stricture dilated;  Rx Pepcid 20mg /d... ~  EGD 09/02/08 by DrPerry showed a HH, schatzki ring, 1cm submucosal nodule in antrum, 2cm sessile polyp in duod. 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 ~  f/u DrPatterson 6/10- continue KAPIDEX & reflux regimen... ~  1/12:  he is overdue for f/u w/ GI & we will request appt.  Hx of Hyperplastic COLONIC POLYP in 1999 - all followed by DrPatterson;  ~  his last colonoscopy was 8/04 and normal without recurrent polyps... ~  f/u colonoscopy 8/09 by DrPatterson showed 2mm polyp, bx= hyperplastic, f/u planned 54yrs.  Hx Elevated LFT's - routine CPX 12/08 w/ abn LFTs- this was felt to be secondary to his Statin therapy... he was told to stop the Statin and f/u labs in one month... he requested a liver evaluation from DrPatterson and he was seen 12/09 at which time DrPatterson rechecked his LFT's and they were all back to normal... he also checked the following: ~  AbdSonar w/ echodense liver, no lesions or GB problems... ~  Viral Hepatitis serologies were all negative, including HepC << 8/12: CDC came out w/ new rec that all Boomers should be checked for HepC. ~  A1AT level was normal ~  Ceruloplasmin level was normal ~  ANA was neg, and  Antimitochondrial AB/ Anti Smooth Muscle Ab were neg as well... ~  Serum Fe was normal & B12/Folate levels were normal as well... ~  2/12 NOTE:  LFTs have remained WNL & he is on Cres10.  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 ~  1/12: he reports Rx by DrTannenbaum w/ AXIRON 30mg  per actuation- use under each arm daily; Testos  level 12/11= 575 on Rx.  OSTEOARTHRITIS, Hx of BACK PAIN - as noted he feels he must take the CELEBREX daily as his pain (esp R knee) is much worse when he doesn't take it;  he sees DrOlin and had a RKnee arthroscopy 9/07> he uses Glucosamine, MVI, Vit D & Folic... ~  7/10:  he requests Rx for Celebrex 100mg  #90- take 1 cap daily as needed (advised just Prn use). ~  8/12:  Pt requests generic substitute for Celebrex (try ETODOLAC 400mg ) & referral to DrDeveshwar for Rheumatology eval...  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  HEALTH MAINTENANCE - PNEUMOVAX given age 73 (2011);  he get the yearly seasonal Flu vaccine every fall;  he had a shingles vaccine as well;  had tetanus shot 2005 w/ trip to [place];  up-to-date on colonoscopy done 2009; prostate checks by DrTannenbaum yearly...   Past Surgical History  Procedure Date  . S/p right knee arthroscopy 2005  . Rca stenting   . Hernia surgery x 3   . Oral surg to removed growth from ?sinus 10/2010    ?Dermoid removed [facility]    Outpatient Encounter Prescriptions as of 01/07/2011  Medication Sig Dispense Refill  . Alum & Mag Hydroxide-Simeth (MAGIC MOUTHWASH) SOLN Take by mouth. 1 tsp gargle and swallow up to 4 times daily as needed       . aspirin (ADULT ASPIRIN EC LOW STRENGTH) 81 MG EC  tablet Take 81 mg by mouth daily.        Marland Kitchen b complex vitamins tablet Take 1 tablet by mouth daily.        . carvedilol (COREG) 12.5 MG tablet Take 12.5 mg by mouth 2 (two) times daily with a meal.        . celecoxib (CELEBREX) 100 MG capsule Take 100 mg by mouth daily.   ==> ch to ETODOLAC 400mg  Bid prn...    . clopidogrel (PLAVIX) 75 MG tablet Take 1 tablet (75 mg total) by mouth daily.  90 tablet  3  . Coenzyme Q10 (CO Q 10) 100 MG CAPS Take 1 capsule by mouth daily.        . CRESTOR 10 MG tablet TAKE 1 TABLET DAILY  90 tablet  2  . fish oil-omega-3 fatty acids 1000 MG capsule Take 2 g by mouth daily.        . Flaxseed, Linseed, (FLAX SEED OIL) 1000 MG CAPS Take 1 capsule by mouth daily.        . folic acid (FOLVITE) 400 MCG tablet Take 400 mcg by mouth daily.        . Glucosamine-Chondroitin 1500-1200 MG/30ML LIQD Take 1 tablet by mouth daily.        . Multiple Vitamin (MULTIVITAMIN) capsule Take 1 capsule by mouth daily.        . niacin (SLO-NIACIN) 500 MG tablet Take 500 mg by mouth daily.        . Testosterone (AXIRON) 30 MG/ACT SOLN Place onto the skin as directed.        . valsartan (DIOVAN) 160 MG tablet Take 160 mg by mouth daily.   ==> ch to LOSARTAN 100mg /d...      Allergies  Allergen Reactions  . Sulfamethoxazole W/Trimethoprim     REACTION: pt states allergic to SULFA drugs/ Rash  . Sulfonamide Derivatives     REACTION: rash    Current Medications, Allergies,  Past Medical History, Past Surgical History, Family History, and Social History were reviewed in [employer] record.     Review of Systems       See HPI - all other systems neg except as noted...       The patient complains of dyspnea on exertion.  The patient denies anorexia, fever, weight loss, weight gain, vision loss, decreased hearing, hoarseness, chest pain, syncope, peripheral edema, prolonged cough, headaches, hemoptysis, abdominal pain, melena, hematochezia, severe  indigestion/heartburn, hematuria, incontinence, muscle weakness, suspicious skin lesions, transient blindness, difficulty walking, depression, unusual weight change, abnormal bleeding, enlarged lymph nodes, and angioedema.     Objective:   Physical Exam      WD, WN, 65 y/o WM in NAD... GENERAL:  Alert & oriented; pleasant & cooperative. HEENT:  [place]/AT, EOM-wnl, PERRLA, EACs-clear, TMs-wnl, NOSE-clear, THROAT-clear & wnl. NECK:  Supple w/ fairROM; no JVD; normal carotid impulses w/o bruits; no thyromegaly or nodules palpated; no lymphadenopathy. CHEST:  Clear to P & A; without wheezes/ rales/ or rhonchi. HEART:  Regular Rhythm; without murmurs/ rubs/ or gallops. ABDOMEN:  Soft & nontender; normal bowel sounds; no organomegaly or masses detected. EXT: without deformities, mild arthritic changes; no varicose veins/ venous insuffic/ or edema. NEURO:  CN's intact; motor testing normal; sensory testing normal; gait normal & balance OK. DERM:  No lesions noted; no rash etc...   Assessment & Plan:   OSA>  He uses his CPAP compliantly w/o problems or daytime hypersomnolence...  HBP>  Controlled on Coreg & Diovan; he requests switch of his ARB to generic> try LOSARTAN 100mg /d...  CAD>  Stable on current med regimen; continue ASA/ Plavix, BP meds, Statin rx, etc; followed by DrCooper...  HYPERLIPID>  Stable on Crestor, Niaspan, Fish Oil, etc...  GI> HH, Hx stricture, s/p removal of duod adenoma>  Followed by DrPatterson & DrJacobs...  GI> Hx colon polyps>  F/u colon due 8/14 per DrPatterson...  GU> BPH, Low-T, ED>  Followed & managed by DrTannenbaum on AXIRON...  DJD>  He has noted that he MUST take the Celebrex every day; asking for Rheum referral & cheaper NDAID (try ETODOLAC 400mg  Bid prn).Marland KitchenMarland Kitchen

## 2011-01-07 NOTE — Patient Instructions (Signed)
Today we updated your med list in EPIC...    We decided to change your Diovan to LOSARTAN 100mg  take one tab daily 7 monitor your BP at home...    We decided to try ETODOLAC 400mg  (one tab twice daily as needed for arthritis pain) instead of the Celebrex to save some $$$...  We will arrange for a referral to see DrDeveshwar for Rheumatology...  Call for any questions...  Let's plana follow up visit in 6 months w/ FASTING blood work at that time.Marland KitchenMarland Kitchen

## 2011-03-04 ENCOUNTER — Other Ambulatory Visit: Payer: Self-pay | Admitting: Cardiovascular Disease

## 2011-03-05 ENCOUNTER — Other Ambulatory Visit: Payer: Self-pay | Admitting: Rheumatology

## 2011-03-05 ENCOUNTER — Ambulatory Visit
Admission: RE | Admit: 2011-03-05 | Discharge: 2011-03-05 | Disposition: A | Discharge status: Home / Self Care | Payer: Medicare Other | Source: Ambulatory Visit | Attending: Rheumatology | Admitting: Rheumatology

## 2011-03-05 DIAGNOSIS — D869 Sarcoidosis, unspecified: Secondary | ICD-10-CM

## 2011-03-19 ENCOUNTER — Other Ambulatory Visit (HOSPITAL_BASED_OUTPATIENT_CLINIC_OR_DEPARTMENT_OTHER): Payer: Self-pay | Admitting: Rheumatology

## 2011-03-19 DIAGNOSIS — R52 Pain, unspecified: Secondary | ICD-10-CM

## 2011-03-21 ENCOUNTER — Telehealth: Payer: Self-pay

## 2011-03-21 NOTE — Telephone Encounter (Signed)
Message copied by Donata Duff on Thu Mar 21, 2011  8:58 AM ------      Message from: Harlow Mares D      Created: Wed Jul 25, 2010  9:58 AM       ?? Needs repeat EUS in december

## 2011-03-21 NOTE — Telephone Encounter (Signed)
Dr Christella Hartigan do you want this pt to have a repeat EUS this year ?  Per last EUS repeat in possibly 2 years.

## 2011-03-21 NOTE — Telephone Encounter (Signed)
He hasn't been seen in GI in over two years.  Should get back in with Dr. Jarold Motto, see if follow up of duodenal polyp and small subepithelial lesion is still relevant given his other comorbidities.

## 2011-03-22 NOTE — Telephone Encounter (Signed)
Spoke with pt to schedule an OV with Dr Jarold Motto per note. Pt stated he had already made an appt with Dr Christella Hartigan on 04/03/11 because he has been having stomach problems including heartburn x 4-5 weeks. Please advise Dr Christella Hartigan; I would be happy to schedule if needed with Dr Jarold Motto. Thanks.

## 2011-03-22 NOTE — Telephone Encounter (Signed)
That is ok for him to stay on my schedule. Just did not want a "direct to EUS" without him being seen first in office.

## 2011-03-22 NOTE — Telephone Encounter (Signed)
Notified pt Dr Christella Hartigan would be happy to see him on 04/03/11 @ 0845am.

## 2011-03-26 ENCOUNTER — Ambulatory Visit (HOSPITAL_BASED_OUTPATIENT_CLINIC_OR_DEPARTMENT_OTHER)
Admission: RE | Admit: 2011-03-26 | Discharge: 2011-03-26 | Disposition: A | Discharge status: Home / Self Care | Payer: Medicare Other | Source: Ambulatory Visit | Attending: Rheumatology | Admitting: Rheumatology

## 2011-03-26 DIAGNOSIS — M25519 Pain in unspecified shoulder: Secondary | ICD-10-CM | POA: Insufficient documentation

## 2011-03-26 DIAGNOSIS — S46819A Strain of other muscles, fascia and tendons at shoulder and upper arm level, unspecified arm, initial encounter: Secondary | ICD-10-CM | POA: Insufficient documentation

## 2011-03-26 DIAGNOSIS — R52 Pain, unspecified: Secondary | ICD-10-CM

## 2011-03-26 DIAGNOSIS — M719 Bursopathy, unspecified: Secondary | ICD-10-CM | POA: Insufficient documentation

## 2011-03-26 DIAGNOSIS — X58XXXA Exposure to other specified factors, initial encounter: Secondary | ICD-10-CM | POA: Insufficient documentation

## 2011-03-26 DIAGNOSIS — M19019 Primary osteoarthritis, unspecified shoulder: Secondary | ICD-10-CM | POA: Insufficient documentation

## 2011-03-26 DIAGNOSIS — M67919 Unspecified disorder of synovium and tendon, unspecified shoulder: Secondary | ICD-10-CM | POA: Insufficient documentation

## 2011-04-03 ENCOUNTER — Encounter: Payer: Self-pay | Admitting: Gastroenterology

## 2011-04-03 ENCOUNTER — Ambulatory Visit (INDEPENDENT_AMBULATORY_CARE_PROVIDER_SITE_OTHER): Payer: Medicare Other | Admitting: Gastroenterology

## 2011-04-03 VITALS — BP 124/68 | HR 60 | Ht 68.0 in | Wt 183.0 lb

## 2011-04-03 DIAGNOSIS — D1330 Benign neoplasm of unspecified part of small intestine: Secondary | ICD-10-CM

## 2011-04-03 DIAGNOSIS — R1310 Dysphagia, unspecified: Secondary | ICD-10-CM

## 2011-04-03 DIAGNOSIS — K219 Gastro-esophageal reflux disease without esophagitis: Secondary | ICD-10-CM

## 2011-04-03 NOTE — Patient Instructions (Signed)
You will be set up for an upper endoscopy for dysphagia, history of duodenal polyp and submucosal stomach lesion We will contac your cardiologist about holding the Plavix for 5 days prior to the procedure

## 2011-04-03 NOTE — Progress Notes (Signed)
Review of pertinent gastrointestinal problems: 1.  incidentally noted duodenal polyp partially resected endoscopically TVA (09/2008), repeat procedure December 2012 found small residual tubular adenoma at the site, appeared to be completely removed endoscopically, he was recommended to have a repeat examination in 2 years 2.  antral subepithelial lesion noted 7mm by 5mm by endoscopic ultrasound May 2010 ; measured 8.59mm by 3.53mm on repeat endoscopic ultrasound December 2010   HPI: This is a  very pleasant 65 year old man whom I last saw about 2 years at the time of an upper endoscopy to remove duodenal polyp tissue. See those results summarized above. Recently he was on antibiotics for a dental issue. Shortly after that he started having dysphagia and worsening of chronic intermittent GERD. He was also on some heavy NSAIDs for dental pain, back pain. His rheumatologist told him to stop the NSAIDs and started him on a proton pump inhibitor 2 weeks ago. He has noticed a definite improvement in his GERD symptoms, he still does have intermittent dysphagia. No weight loss, no overt GI bleeding    Past Medical History  Diagnosis Date  . HTN (hypertension)   . CAD (coronary artery disease)   . Hyperlipidemia   . Allergic rhinitis   . OSA (obstructive sleep apnea)   . Asthmatic bronchitis   . Hemoptysis   . Hiatal hernia   . GERD (gastroesophageal reflux disease)   . Esophageal stricture   . 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 History of colonic polyps     hyperplastic  . Hypertrophy of prostate with urinary obstruction and other lower urinary tract symptoms (LUTS)   . Testicular hypofunction   . OA (osteoarthritis)   . Chronic back pain     intermittent  . Dizziness   . Hx of colonoscopy     Past Surgical History  Procedure Date  . S/p right knee arthroscopy 2005  . Rca stenting   . Hernia  surgery x 3   . Oral surg to removed growth from ?sinus 10/2010    ?Dermoid removed by Jeronimo Norma    Current Outpatient Prescriptions  Medication Sig Dispense Refill  . Alum & Mag Hydroxide-Simeth (MAGIC MOUTHWASH) SOLN Take by mouth. 1 tsp gargle and swallow up to 4 times daily as needed       . aspirin (ADULT ASPIRIN EC LOW STRENGTH) 81 MG EC tablet Take 81 mg by mouth daily.        Marland Kitchen b complex vitamins tablet Take 1 tablet by mouth daily.        . carvedilol (COREG) 12.5 MG tablet TAKE 1 TABLET TWICE A DAY  180 tablet  1  . clopidogrel (PLAVIX) 75 MG tablet Take 1 tablet (75 mg total) by mouth daily.  90 tablet  3  . Coenzyme Q10 (CO Q 10) 100 MG CAPS Take 1 capsule by mouth daily.        . CRESTOR 10 MG tablet TAKE 1 TABLET DAILY  90 tablet  2  . fish oil-omega-3 fatty acids 1000 MG capsule Take 2 g by mouth daily.        . Flaxseed, Linseed, (FLAX SEED OIL) 1000 MG CAPS Take 1 capsule by mouth daily.        . folic acid (FOLVITE) 400 MCG tablet Take 400 mcg by mouth daily.        . Glucosamine-Chondroitin 1500-1200 MG/30ML LIQD Take 1 tablet by  mouth daily.        Marland Kitchen losartan (COZAAR) 100 MG tablet Take 1 tablet (100 mg total) by mouth daily.  90 tablet  3  . Multiple Vitamin (MULTIVITAMIN) capsule Take 1 capsule by mouth daily.        . niacin (SLO-NIACIN) 500 MG tablet Take 500 mg by mouth daily.        . pantoprazole (PROTONIX) 40 MG tablet One tablet by mouth once daily       . Testosterone (AXIRON) 30 MG/ACT SOLN Place onto the skin as directed.        . traMADol (ULTRAM) 50 MG tablet One tablet by mouth three times a day         Allergies as of 04/03/2011 - Review Complete 04/03/2011  Allergen Reaction Noted  . Sulfamethoxazole w/trimethoprim  12/28/2007  . Sulfonamide derivatives  06/26/2010    Family History  Problem Relation Age of Onset  . Coronary artery disease Father   . Diabetes Father   . Hypertension    . Sudden death Father     due to heart disease  . Colon  cancer Neg Hx   . Heart disease Mother   . Stomach cancer Other     grandmother    History   Social History  . Marital Status: Married    Spouse Name: N/A    Number of Children: N/A  . Years of Education: N/A   Occupational History  . Nutritional therapist    Social History Main Topics  . Smoking status: Former Smoker    Quit date: 05/20/1990  . Smokeless tobacco: Never Used   Comment: previous 30 pack year history  . Alcohol Use: No  . Drug Use: No  . Sexually Active: Not on file   Other Topics Concern  . Not on file   Social History Narrative  . No narrative on file      Physical Exam: BP 124/68  Pulse 60  Ht 5\' 8"  (1.727 m)  Wt 183 lb (83.008 kg)  BMI 27.82 kg/m2 Constitutional: generally well-appearing Psychiatric: alert and oriented x3 Abdomen: soft, nontender, nondistended, no obvious ascites, no peritoneal signs, normal bowel sounds     Assessment and plan: 65 y.o. male with history of duodenal polyp, chronic intermittent GERD, current dysphasia, history of gastric submucosal lesion.  We will proceed with EGD at his soonest convenience to evaluate the several issues listed above.  During a GI procedure and we will contact his cardiologist about holding it for 5 days.

## 2011-04-05 ENCOUNTER — Telehealth: Payer: Self-pay

## 2011-04-05 NOTE — Telephone Encounter (Signed)
Pt aware to hold Plavix 5 days prior to procedure per Dr Excell Seltzer

## 2011-04-26 ENCOUNTER — Encounter: Payer: Self-pay | Admitting: Gastroenterology

## 2011-04-26 ENCOUNTER — Ambulatory Visit (AMBULATORY_SURGERY_CENTER): Payer: Medicare Other | Admitting: Gastroenterology

## 2011-04-26 DIAGNOSIS — R1310 Dysphagia, unspecified: Secondary | ICD-10-CM

## 2011-04-26 DIAGNOSIS — D1330 Benign neoplasm of unspecified part of small intestine: Secondary | ICD-10-CM

## 2011-04-26 DIAGNOSIS — K219 Gastro-esophageal reflux disease without esophagitis: Secondary | ICD-10-CM

## 2011-04-26 DIAGNOSIS — K222 Esophageal obstruction: Secondary | ICD-10-CM

## 2011-04-26 MED ORDER — SODIUM CHLORIDE 0.9 % IV SOLN: 500.0000 mL | INTRAVENOUS | Status: DC

## 2011-04-26 NOTE — Op Note (Signed)
[facility] [address] [place], [place]  [address]  ENDOSCOPY PROCEDURE REPORT PATIENT:  [name], [name]  MR#:  [id number] BIRTHDATE:  10-Jul-1945, 65 yrs. old  GENDER:  male ENDOSCOPIST:  Rachael Fee, MD PROCEDURE DATE:  04/26/2011 PROCEDURE:  EGD with biopsy, 43239, EGD with balloon dilatation ASA CLASS:  Class II INDICATIONS:  1.  incidentally noted duodenal polyp partially resected endoscopically TVA (09/2008), repeat procedure December 2012 found small residual tubular adenoma at the site, appeared to be completely removed endoscopically, he was recommended to have a repeat examination in 2 years 2.  antral subepithelial lesion noted 7mm by 5mm by endoscopic ultrasound May 2010 ; measured 8.46mm by 3.37mm on repeat endoscopic ultrasound December 2010 MEDICATIONS:   Fentanyl 50 mcg IV, Versed 6 mg IV, These medications were titrated to patient response per physician's verbal order TOPICAL ANESTHETIC:  Cetacaine Spray DESCRIPTION OF PROCEDURE:   After the risks benefits and alternatives of the procedure were thoroughly explained, informed consent was obtained.  The LB [id number] endoscope was introduced through the mouth and advanced to the second portion of the duodenum, without limitations.  The instrument was slowly withdrawn as the mucosa was fully examined. <<PROCEDUREIMAGES>> A Schatzki's ring was found. This was dilated up to 20mm with CRE TTS balloon. There was no bleeding following 1 minute dilation (see image1 and image9).  There was a small amount of residual, recurrent polyp tissue in second duodenum. This was removed and sent to pathology (jar 1) (see image4).  submucosal nodule. The previously noted subepthilial lesion in gastric antrum was unchanged (see image2).  Otherwise the examination was normal (see image3 and image6).    Retroflexed views revealed no abnormalities.    The scope was then withdrawn from the patient and the procedure  completed. COMPLICATIONS:  None  ENDOSCOPIC IMPRESSION: 1) Schatzki's ring, dilated to 20mm 2) Sessile polyp in duodenum. This was small, removed and sent to pathology 3) Submucosal nodule has not changed in size since 2010 4) Otherwise normal examination  RECOMMENDATIONS: Await final pathology (likely repeat EGD for polyp surveillance in 2-3 years). Can repeat dilation as needed. The subepithelial lesion in stomach has not changed in size and I do not think it requires dedicated surveillance.  ______________________________ Rachael Fee, MD  n. eSIGNED:   Rachael Fee at 04/26/2011 10:28 AM  Sherrell Puller, [phone]

## 2011-04-26 NOTE — Progress Notes (Signed)
Patient did not experience any of the following events: a burn prior to discharge; a fall within the facility; wrong site/side/patient/procedure/implant event; or a hospital transfer or hospital admission upon discharge from the facility. (G8907) Patient did not have preoperative order for IV antibiotic SSI prophylaxis. ([id number])  

## 2011-04-26 NOTE — Patient Instructions (Addendum)
Please follow all discharge instructions given to you by the recovery room nurse. If you have any questions or problems after discharge please call [phone]. You will receive a phone call in the am to see how you are doing and answer any questions you may have. Thank you for choosing [facility] for your health care needs.   Per Dr Christella Hartigan restrart plavix today.

## 2011-04-26 NOTE — Progress Notes (Signed)
Pt tolerated the EGD with dilation very well. maw

## 2011-04-29 ENCOUNTER — Telehealth: Payer: Self-pay | Admitting: *Deleted

## 2011-04-29 NOTE — Telephone Encounter (Signed)

## 2011-06-04 ENCOUNTER — Telehealth: Payer: Self-pay | Admitting: *Deleted

## 2011-06-04 DIAGNOSIS — N490 Inflammatory disorders of seminal vesicle: Secondary | ICD-10-CM | POA: Diagnosis not present

## 2011-06-04 DIAGNOSIS — E291 Testicular hypofunction: Secondary | ICD-10-CM | POA: Diagnosis not present

## 2011-06-04 DIAGNOSIS — I2510 Atherosclerotic heart disease of native coronary artery without angina pectoris: Secondary | ICD-10-CM

## 2011-06-04 DIAGNOSIS — N400 Enlarged prostate without lower urinary tract symptoms: Secondary | ICD-10-CM | POA: Diagnosis not present

## 2011-06-04 MED ORDER — LOSARTAN POTASSIUM 100 MG PO TABS: 100.0000 mg | ORAL_TABLET | Freq: Every day | ORAL | Status: DC

## 2011-06-04 MED ORDER — CARVEDILOL 12.5 MG PO TABS: 12.5000 mg | ORAL_TABLET | Freq: Two times a day (BID) | ORAL | Status: DC

## 2011-06-04 MED ORDER — ROSUVASTATIN CALCIUM 10 MG PO TABS: 10.0000 mg | ORAL_TABLET | Freq: Every day | ORAL | Status: DC

## 2011-06-04 MED ORDER — CLOPIDOGREL BISULFATE 75 MG PO TABS: 75.0000 mg | ORAL_TABLET | Freq: Every day | ORAL | Status: DC

## 2011-06-04 NOTE — Telephone Encounter (Signed)
REQUESTED REFILLS SENT VIA [facility] TO [facility] .Zack Seal

## 2011-06-04 NOTE — Telephone Encounter (Signed)
Pt calling to say that the four meds he needed for refill were sent to [facility] and not his mail order pharmacy [facility], pls resend, pt running low needs called in asap

## 2011-06-07 ENCOUNTER — Telehealth: Payer: Self-pay | Admitting: Pulmonary Disease

## 2011-06-07 MED ORDER — AZITHROMYCIN 250 MG PO TABS: ORAL_TABLET | ORAL | Status: AC

## 2011-06-07 NOTE — Telephone Encounter (Signed)
Per SN---ok for pt to have zpak #1  Take as directed, mucinex 1-2 po bid with plenty of fluids, nasal saline spray every 1-2 hours prn.  thanks

## 2011-06-07 NOTE — Telephone Encounter (Signed)
I spoke with pt and c/o  Light temp 99.7, blowing out green-yellow phlem, cough w/ yellow- green phlem, nasal congestion, chest congestion, sinus pressure and headache x 1 week. Denies any body aches, nausea, vomiting. He has been taking robitussin and zicam w/o relief. Pt is requesting to have something called in for [facility] into [facility] [address]. Please advise Dr. Kriste Basque, thanks  Allergies  Allergen Reactions  . Sulfamethoxazole W/Trimethoprim     REACTION: pt states allergic to SULFA drugs/ Rash  . Sulfonamide Derivatives     REACTION: rash

## 2011-06-07 NOTE — Telephone Encounter (Signed)
I spoke with pt and is aware of SN recs. Rx has been sent to the pharmacy and nothing further was needed

## 2011-06-10 ENCOUNTER — Other Ambulatory Visit: Payer: Self-pay

## 2011-06-10 DIAGNOSIS — I2510 Atherosclerotic heart disease of native coronary artery without angina pectoris: Secondary | ICD-10-CM

## 2011-06-10 MED ORDER — LOSARTAN POTASSIUM 100 MG PO TABS: 100.0000 mg | ORAL_TABLET | Freq: Every day | ORAL | Status: DC

## 2011-06-10 MED ORDER — ROSUVASTATIN CALCIUM 10 MG PO TABS: 10.0000 mg | ORAL_TABLET | Freq: Every day | ORAL | Status: DC

## 2011-06-10 MED ORDER — CARVEDILOL 12.5 MG PO TABS: 12.5000 mg | ORAL_TABLET | Freq: Two times a day (BID) | ORAL | Status: DC

## 2011-06-10 MED ORDER — CLOPIDOGREL BISULFATE 75 MG PO TABS: 75.0000 mg | ORAL_TABLET | Freq: Every day | ORAL | Status: DC

## 2011-06-10 NOTE — Telephone Encounter (Signed)
..   Requested Prescriptions   Signed Prescriptions Disp Refills  . rosuvastatin (CRESTOR) 10 MG tablet 90 tablet 3    Sig: Take 1 tablet (10 mg total) by mouth daily.    Authorizing Provider: Tonny Bollman D    Ordering User: Cire Deyarmin M  . clopidogrel (PLAVIX) 75 MG tablet 90 tablet 2    Sig: Take 1 tablet (75 mg total) by mouth daily.    Authorizing Provider: Tonny Bollman D    Ordering User: Genelle Economou M  . losartan (COZAAR) 100 MG tablet 90 tablet 3    Sig: Take 1 tablet (100 mg total) by mouth daily.    Authorizing Provider: Tonny Bollman D    Ordering User: Christella Hartigan, Su Duma Judie Petit

## 2011-06-10 NOTE — Telephone Encounter (Signed)
..   Requested Prescriptions   Signed Prescriptions Disp Refills  . carvedilol (COREG) 12.5 MG tablet 180 tablet 1    Sig: Take 1 tablet (12.5 mg total) by mouth 2 (two) times daily with a meal.    Authorizing Provider: Tonny Bollman D    Ordering User: Christella Hartigan, Fernado Brigante Judie Petit

## 2011-06-27 ENCOUNTER — Telehealth: Payer: Self-pay | Admitting: Pulmonary Disease

## 2011-06-27 DIAGNOSIS — E785 Hyperlipidemia, unspecified: Secondary | ICD-10-CM

## 2011-06-27 DIAGNOSIS — M1990 Unspecified osteoarthritis, unspecified site: Secondary | ICD-10-CM

## 2011-06-27 DIAGNOSIS — K219 Gastro-esophageal reflux disease without esophagitis: Secondary | ICD-10-CM

## 2011-06-27 DIAGNOSIS — D135 Benign neoplasm of extrahepatic bile ducts: Secondary | ICD-10-CM

## 2011-06-27 DIAGNOSIS — N400 Enlarged prostate without lower urinary tract symptoms: Secondary | ICD-10-CM

## 2011-06-27 DIAGNOSIS — D126 Benign neoplasm of colon, unspecified: Secondary | ICD-10-CM

## 2011-06-27 DIAGNOSIS — I10 Essential (primary) hypertension: Secondary | ICD-10-CM

## 2011-06-27 DIAGNOSIS — F419 Anxiety disorder, unspecified: Secondary | ICD-10-CM

## 2011-06-27 DIAGNOSIS — D134 Benign neoplasm of liver: Secondary | ICD-10-CM

## 2011-06-27 DIAGNOSIS — E291 Testicular hypofunction: Secondary | ICD-10-CM

## 2011-06-27 NOTE — Telephone Encounter (Signed)
Called and spoke with pt and he is aware of labs in the computer and pt will come in next Tuesday for his fasting labs.

## 2011-06-27 NOTE — Telephone Encounter (Signed)
Pt states has CPX 07/10/11, and in the past his labs are done after visit and same is not discussed. Pt requesting to have "full lab work-up" on Tuesday when he is back in town. Please advise.

## 2011-07-03 ENCOUNTER — Ambulatory Visit (INDEPENDENT_AMBULATORY_CARE_PROVIDER_SITE_OTHER): Payer: Medicare Other | Admitting: Cardiovascular Disease

## 2011-07-03 ENCOUNTER — Encounter: Payer: Self-pay | Admitting: Cardiovascular Disease

## 2011-07-03 DIAGNOSIS — E785 Hyperlipidemia, unspecified: Secondary | ICD-10-CM

## 2011-07-03 DIAGNOSIS — I2510 Atherosclerotic heart disease of native coronary artery without angina pectoris: Secondary | ICD-10-CM | POA: Diagnosis not present

## 2011-07-03 DIAGNOSIS — I10 Essential (primary) hypertension: Secondary | ICD-10-CM | POA: Diagnosis not present

## 2011-07-03 DIAGNOSIS — R0602 Shortness of breath: Secondary | ICD-10-CM | POA: Diagnosis not present

## 2011-07-03 NOTE — Assessment & Plan Note (Signed)
Blood pressure well controlled on a combination of carvedilol and losartan.

## 2011-07-03 NOTE — Progress Notes (Signed)
HPI:  [name] returns for followup. He is 66 years old and is followed for coronary artery disease and chronic dyspnea. He underwent stenting of the right coronary artery in 2005 with a drug-eluting stent. The patient has had left bundle branch block on his EKG for many years. He's had preserved LV function. He has undergone extensive workup for chronic dyspnea including both cardiac and pulmonary testing as well as CPX testing.  The patient continues to complain of fatigue and dyspnea. He feel short of breath with one flight of stairs. His breathing is a little worse and it is been in the past. He denies orthopnea, PND, edema, palpitations, or chest pain. He is compliant with his medications. The patient is scheduled for lab work tomorrow as part of his annual physical by Dr. Kriste Basque.  Outpatient Encounter Prescriptions as of 07/03/2011  Medication Sig Dispense Refill  . Alum & Mag Hydroxide-Simeth (MAGIC MOUTHWASH) SOLN Take by mouth. 1 tsp gargle and swallow up to 4 times daily as needed       . aspirin (ADULT ASPIRIN EC LOW STRENGTH) 81 MG EC tablet Take 81 mg by mouth daily.        Marland Kitchen b complex vitamins tablet Take 1 tablet by mouth daily.        . carvedilol (COREG) 12.5 MG tablet Take 1 tablet (12.5 mg total) by mouth 2 (two) times daily with a meal.  180 tablet  3  . clopidogrel (PLAVIX) 75 MG tablet Take 1 tablet (75 mg total) by mouth daily.  90 tablet  2  . Coenzyme Q10 (CO Q 10) 100 MG CAPS Take 1 capsule by mouth daily.        Marland Kitchen etodolac (LODINE) 400 MG tablet Take 400 mg by mouth 2 (two) times daily.      . fish oil-omega-3 fatty acids 1000 MG capsule Take 2 g by mouth daily.        . Flaxseed, Linseed, (FLAX SEED OIL) 1000 MG CAPS Take 1 capsule by mouth daily.        . folic acid (FOLVITE) 400 MCG tablet Take 400 mcg by mouth daily.        . Glucosamine-Chondroitin 1500-1200 MG/30ML LIQD Take 1 tablet by mouth daily.        Marland Kitchen losartan (COZAAR) 100 MG tablet Take 1 tablet (100 mg  total) by mouth daily.  90 tablet  3  . Multiple Vitamin (MULTIVITAMIN) capsule Take 1 capsule by mouth daily.        . niacin (SLO-NIACIN) 500 MG tablet Take 500 mg by mouth daily.        . pantoprazole (PROTONIX) 40 MG tablet One tablet by mouth once daily       . rosuvastatin (CRESTOR) 10 MG tablet Take 1 tablet (10 mg total) by mouth daily.  90 tablet  3  . Testosterone (AXIRON) 30 MG/ACT SOLN Place onto the skin as directed.        . traMADol (ULTRAM) 50 MG tablet Take 50 mg by mouth every 6 (six) hours as needed. One tablet by mouth three times a day      . DISCONTD: carvedilol (COREG) 12.5 MG tablet Take 1 tablet (12.5 mg total) by mouth 2 (two) times daily with a meal.  180 tablet  1    Allergies  Allergen Reactions  . Sulfamethoxazole W/Trimethoprim     REACTION: pt states allergic to SULFA drugs/ Rash  . Sulfonamide Derivatives     REACTION:  rash    Past Medical History  Diagnosis Date  . HTN (hypertension)   . CAD (coronary artery disease)   . Hyperlipidemia   . Allergic rhinitis   . OSA (obstructive sleep apnea)   . Hemoptysis   . Hiatal hernia   . GERD (gastroesophageal reflux disease)   . Esophageal stricture   . 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 History of colonic polyps     hyperplastic  . Hypertrophy of prostate with urinary obstruction and other lower urinary tract symptoms (LUTS)   . Testicular hypofunction   . OA (osteoarthritis)   . Chronic back pain     intermittent  . Dizziness   . Hx of colonoscopy   . CHF (congestive heart failure)     ROS: Negative except as per HPI  BP 128/78  Pulse 73  Ht 5\' 8"  (1.727 m)  Wt 79.833 kg (176 lb)  BMI 26.76 kg/m2  PHYSICAL EXAM: Pt is alert and oriented, NAD HEENT: normal Neck: JVP - normal, carotids 2+= without bruits Lungs: CTA bilaterally CV: RRR without murmur or gallop Abd: soft, NT, Positive BS, no  hepatomegaly Ext: no C/C/E, distal pulses intact and equal Skin: warm/dry no rash  EKG:  Normal sinus rhythm with first-degree AV block, left bundle branch block, heart rate 73 beats per minute.  ASSESSMENT AND PLAN:

## 2011-07-03 NOTE — Assessment & Plan Note (Signed)
Lipid panel from last year was reviewed. He has low HDL cholesterol, low LDL cholesterol (at goal of less than 70 mg/dL). He will continue on Crestor 10 mg daily.

## 2011-07-03 NOTE — Assessment & Plan Note (Signed)
Stable without angina. We'll continue dual antiplatelet therapy with aspirin and Plavix. Recommend followup in 6 months.

## 2011-07-03 NOTE — Patient Instructions (Signed)
Your physician recommends that you have lab work: BNP (Please have drawn with other labs from Dr Kriste Basque)   Your physician has requested that you have an echocardiogram. Echocardiography is a painless test that uses sound waves to create images of your heart. It provides your doctor with information about the size and shape of your heart and how well your heart's chambers and valves are working. This procedure takes approximately one hour. There are no restrictions for this procedure.  Your physician wants you to follow-up in: 6 MONTHS.  You will receive a reminder letter in the mail two months in advance. If you don't receive a letter, please call our office to schedule the follow-up appointment.  Your physician recommends that you continue on your current medications as directed. Please refer to the Current Medication list given to you today.

## 2011-07-03 NOTE — Assessment & Plan Note (Signed)
There is no clear single cause of this. His lung exam is completely normal. Because of slowly progressive symptoms, I have recommended a repeat echocardiogram in the setting of his chronic left bundle-branch block to rule out LV dysfunction. Also will add a BNP to tomorrow's lab work.

## 2011-07-04 ENCOUNTER — Other Ambulatory Visit (INDEPENDENT_AMBULATORY_CARE_PROVIDER_SITE_OTHER): Payer: Medicare Other

## 2011-07-04 DIAGNOSIS — N400 Enlarged prostate without lower urinary tract symptoms: Secondary | ICD-10-CM

## 2011-07-04 DIAGNOSIS — E291 Testicular hypofunction: Secondary | ICD-10-CM

## 2011-07-04 DIAGNOSIS — I10 Essential (primary) hypertension: Secondary | ICD-10-CM

## 2011-07-04 DIAGNOSIS — D126 Benign neoplasm of colon, unspecified: Secondary | ICD-10-CM | POA: Diagnosis not present

## 2011-07-04 DIAGNOSIS — M810 Age-related osteoporosis without current pathological fracture: Secondary | ICD-10-CM | POA: Diagnosis not present

## 2011-07-04 DIAGNOSIS — F411 Generalized anxiety disorder: Secondary | ICD-10-CM

## 2011-07-04 DIAGNOSIS — R0602 Shortness of breath: Secondary | ICD-10-CM | POA: Diagnosis not present

## 2011-07-04 DIAGNOSIS — E785 Hyperlipidemia, unspecified: Secondary | ICD-10-CM | POA: Diagnosis not present

## 2011-07-04 DIAGNOSIS — I2510 Atherosclerotic heart disease of native coronary artery without angina pectoris: Secondary | ICD-10-CM | POA: Diagnosis not present

## 2011-07-04 DIAGNOSIS — F419 Anxiety disorder, unspecified: Secondary | ICD-10-CM

## 2011-07-04 LAB — URINALYSIS, ROUTINE W REFLEX MICROSCOPIC
Bilirubin Urine: NEGATIVE
Ketones, ur: NEGATIVE
Nitrite: NEGATIVE
Total Protein, Urine: NEGATIVE
Urine Glucose: NEGATIVE
pH: 6 (ref 5.0–8.0)

## 2011-07-04 LAB — HEPATIC FUNCTION PANEL
ALT: 18 U/L (ref 0–53)
AST: 22 U/L (ref 0–37)
Alkaline Phosphatase: 57 U/L (ref 39–117)
Total Bilirubin: 1.3 mg/dL — ABNORMAL HIGH (ref 0.3–1.2)

## 2011-07-04 LAB — CBC WITH DIFFERENTIAL/PLATELET
Basophils Absolute: 0 10*3/uL (ref 0.0–0.1)
Eosinophils Absolute: 0.2 10*3/uL (ref 0.0–0.7)
Eosinophils Relative: 3.2 % (ref 0.0–5.0)
HCT: 44.1 % (ref 39.0–52.0)
Lymphs Abs: 1.2 10*3/uL (ref 0.7–4.0)
MCHC: 33.6 g/dL (ref 30.0–36.0)
MCV: 91.7 fl (ref 78.0–100.0)
Monocytes Absolute: 0.5 10*3/uL (ref 0.1–1.0)
Neutrophils Relative %: 61.5 % (ref 43.0–77.0)
Platelets: 250 10*3/uL (ref 150.0–400.0)
RDW: 13.7 % (ref 11.5–14.6)
WBC: 4.8 10*3/uL (ref 4.5–10.5)

## 2011-07-04 LAB — BASIC METABOLIC PANEL
BUN: 13 mg/dL (ref 6–23)
Chloride: 107 mEq/L (ref 96–112)
Potassium: 4.2 mEq/L (ref 3.5–5.1)
Sodium: 142 mEq/L (ref 135–145)

## 2011-07-04 LAB — LIPID PANEL
LDL Cholesterol: 57 mg/dL (ref 0–99)
Total CHOL/HDL Ratio: 4
Triglycerides: 167 mg/dL — ABNORMAL HIGH (ref 0.0–149.0)

## 2011-07-04 LAB — TSH: TSH: 1.91 u[IU]/mL (ref 0.35–5.50)

## 2011-07-04 LAB — PSA: PSA: 2.08 ng/mL (ref 0.10–4.00)

## 2011-07-10 ENCOUNTER — Ambulatory Visit (INDEPENDENT_AMBULATORY_CARE_PROVIDER_SITE_OTHER): Payer: Medicare Other | Admitting: Pulmonary Disease

## 2011-07-10 ENCOUNTER — Encounter: Payer: Self-pay | Admitting: Pulmonary Disease

## 2011-07-10 VITALS — BP 118/64 | HR 78 | Temp 97.3°F | Ht 68.0 in | Wt 176.4 lb

## 2011-07-10 DIAGNOSIS — R109 Unspecified abdominal pain: Secondary | ICD-10-CM | POA: Diagnosis not present

## 2011-07-10 DIAGNOSIS — E785 Hyperlipidemia, unspecified: Secondary | ICD-10-CM | POA: Diagnosis not present

## 2011-07-10 DIAGNOSIS — I10 Essential (primary) hypertension: Secondary | ICD-10-CM | POA: Diagnosis not present

## 2011-07-10 DIAGNOSIS — E291 Testicular hypofunction: Secondary | ICD-10-CM

## 2011-07-10 DIAGNOSIS — M6281 Muscle weakness (generalized): Secondary | ICD-10-CM | POA: Diagnosis not present

## 2011-07-10 DIAGNOSIS — G4733 Obstructive sleep apnea (adult) (pediatric): Secondary | ICD-10-CM | POA: Diagnosis not present

## 2011-07-10 DIAGNOSIS — M1990 Unspecified osteoarthritis, unspecified site: Secondary | ICD-10-CM

## 2011-07-10 DIAGNOSIS — K449 Diaphragmatic hernia without obstruction or gangrene: Secondary | ICD-10-CM

## 2011-07-10 DIAGNOSIS — I2510 Atherosclerotic heart disease of native coronary artery without angina pectoris: Secondary | ICD-10-CM | POA: Diagnosis not present

## 2011-07-10 DIAGNOSIS — N400 Enlarged prostate without lower urinary tract symptoms: Secondary | ICD-10-CM

## 2011-07-10 DIAGNOSIS — N490 Inflammatory disorders of seminal vesicle: Secondary | ICD-10-CM | POA: Diagnosis not present

## 2011-07-10 DIAGNOSIS — D126 Benign neoplasm of colon, unspecified: Secondary | ICD-10-CM

## 2011-07-10 DIAGNOSIS — K219 Gastro-esophageal reflux disease without esophagitis: Secondary | ICD-10-CM

## 2011-07-10 DIAGNOSIS — M549 Dorsalgia, unspecified: Secondary | ICD-10-CM

## 2011-07-10 NOTE — Patient Instructions (Signed)
Today we updated your med list in our EPIC system...    Continue your current medications the same...  We reviewed your recent lab work & we gave you a copy for your records...  Call for any questions...  Let's plan a follow up appt in 6 months.Marland KitchenMarland Kitchen

## 2011-07-10 NOTE — Progress Notes (Signed)
Subjective:    Patient ID: [name], male    DOB: Feb 19, 1946, 66 y.o.   MRN: [id number]  HPI 66 y/o WM here for a follow up visit... he has multiple medical problems as noted below...    ~  December 20, 2009:  generally stable overall- notes some nocturnal CTS symptoms but doesn't want to use wrist splints; exercising daily w/ treadmill, etc- notes tired & SOB after 45 min work out or if taking stairs... OSA stable on CPAP; BP controlled on meds & denies angina, palpit, etc; Lipids stable on Cres10, Niacin, FishOil; denies any GI concerns at present; saw DrTannenbaum recently w/ PSA reported at 1.3 but found "Low-T" & planning more tests; still taking Celebrex regularly for his DJD; requests refill MMW for Prn use...   ~  June 18, 2010:  he's had a good 431mo> still c/o bilat CTS symtpms in the AM & he will try the wrist splints Qhs...  he was started on AXIRON topical testosterone Rx per DrTannenbaum & improved... he saw DrCooper 8/11 & stable from the CV standpoint- no changes made... he was sent to [facility] by his dentist for ?sinus ?LN in neck- nothing pathological found & told to watch it (exam today= neg no node palp)... he will ret for Fasting blood work...  ~  January 07, 2011:  15mo ROV & he has been doing well overall but has several requests today> wants generic replacement for Diovan (we discussed Losartan); and wants referral to DrDeveshwar for Rheum eval, plus cheaper Celebrex (we discussed Etodolac)...    His BP is well controlled on Coreg & Diovan> measures 132/82 today & he remains asymptomatic;  FLP last done 2/12 & looked great w/ LDL 51 on Crestor10, but HDL low at 26 despite Niacin & fish oil rx> rec continue same meds, low fat diet, & incr exercise)...    He continues to f/u w/ DrCooper for Cards every 431mo- seen last wk> CAD, s/p stent RCA 2005, LBBB; last cath was 2009 w/ patent RCA stent & mild non-obstructive dis elsewhere; doing well, exercising regularly, no CP/ palpit/  SOB/ edema/ etc; rec to continue ASA/ Plavix,     He saw DrTannenbaum 6/12 for Urology f/u> BPH, Hypogonad- on AXIRON w/ incr energy, better appetite, sl wt gain; rec to continue same Rx...    Finally he reports some type of ENT/ Oral surg 6wks ago> referred by his dentist to [facility] w/ surg thru gums to remove a growth in his sinus= ?dermoid...  ~  July 10, 2011:  431mo ROV & [name] says he is doing fine- no new complaints or concerns; we reviewed his Prob List, Meds, XRays & Labs...    OSA> stable on CPAP w/ supplies Q71mo from [employer]; no issues w/ his machine or interface; rests well, wakes refreshed, no daytime hypersomnolence & no prob w/ alertness...    HBP & CAD, s/p stent in RCA 2005, LBBB, EF=45-50% w/ septal dysynergy> followed by DrCooper on ASA/ Plavix/ Coreg12.5Bid/ Cozaar100; BP= 118/64 & he denies CP, palpit, dizzy, SOB, edema...    CHOL> on Cres10, slo-niacin, FishOil, CoQ10, etc; FLP 2/13 showed TChol 124, TG 167, HDL 33, LDL 57; needs better low fat diet...    GI> HH, Hx stricture, antral nodule=adenoma> on Protonix40 followed by DrPatterson & Christella Hartigan (see below)...    GU> BPH, LTOS, Low-T> on Axiron; he relates a story about GU symptoms & extensive work up in their office "doing just fine" he says, incr  energy, etc; treated for seminal vesiculitis w/ Doxy x3wks...    Ortho> DJD, LBP> prev on Celebrex & switched to generic Etodolac; he notes that he's been hurting since he was 66 y/o w/ a strong fam hx arthritis; he requested referral to Rheum & he saw DrDeveshwar but she indicated that anti-inflamm meds wouldn't help & he should take pain med 3/d or as needed...    Derm> Hx skin cancers treated by DrDJones... LABS 2/13:  FLP- elevTG & lowHDL on Cres10;  Chems- wnl;  CBC- wnl;  TSH=1.91;  PSA=2.08;  VitD=58;  UA- clear          PROBLEM LIST:  ENT >> some type of ENT/ Oral surg 7/12> referred by his dentist to [facility] w/ surg thru gums to remove a growth in his sinus=  ?dermoid...  OBSTRUCTIVE SLEEP APNEA - Hx OSA w/ sleep study 11/02 RDI=18 and desat to 85%; seen by DrClance and on CPAP rx;  he's been using it regularly, denies daytime hypersomnolence, etc;  He gets machine check & new mask Q40mo thru [employer] & he reports download showed 12,000 hrs use! ~  CXR 3/10 clear w/ min left basilar scarring... ~  Pt gets his CPAP machine checked & masks replaced every 54mo thru [place]... ~  CXR 10/12 showed normal heart size, clear lungs x left base atx, mild degen changes in TSpine...  Episode of HEMOPTYSIS - Hosp 4/10 after episode of BRB hemoptysis> full eval was neg, no recurrence... he had bronchoscopy- neg,  ENT eval DrRosen- neg, noted some persistant cough treated w/ Mucinex DM, Zyrtek, Tussionex...  no recurrence.  HBP & CAD-S/P stent in RCA on 2005 - followed regularly by DrCooper on ASA 81mg /d,  PLAVIX 75mg /d,  COREG 12.5mg Bid,  DIOVAN 160mg /d...  BP= 132/82 & similar at home> denies HA, fatigue, visual changes, CP, palipit, dizziness, syncope, dyspnea, edema, etc...  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 ~  2/13: he had f/u DrCooper> still c/o fatigue & dyspnea; no angina, BP controlled, BNP=25, 2DEcho ==> done 3/13 showed decreased LVF w/ EF~30% & further eval is planned...  HYPERLIPIDEMIA - he follows a low fat diet and takes CRESTOR 10mg /d, NIACIN 500mg /d, Fish Oil, Flax Seed Oil, & CoQ10... ~  FLP 9/08 showed TChol 110, TG 82, HDL 28, LDL 66 ~  FLP 8/09 showed TChol 177, TG 130, HDL 36, LDL 115 ~  FLP 1/10 showed TChol 114, TG 109,  HDL 31, LDL 62 ~  FLP 2/11 showed TChol 103, TG 85, HDL 36, LDL 51 ~  FLP 2/12 showed TChol 101, TG 121, HDL 26, LDL 51 ~  FLP 2/13 on Cres10+Niacin500 showed TChol 124, TG 167, HDL 33, LDL 57  HIATAL HERNIA, Hx of ESOPHAGEAL STRICTURE, OTHER SPECIFIED DISORDER OF STOMACH AND DUODENUM (ICD-537.89) >> he gets the [facility], along w/ the similar product from [employer]- "I read too much"- he stopped PPI/ Dexilant Rx in favor of "Vinegar & Honey- it works as well per the [employer]"...  ~  he had an EGD 6/08 showing a 3 cm HH & distal esoph stricture dilated;  Rx Pepcid 20mg /d... ~  EGD 09/02/08 by DrPerry showed a HH, schatzki ring, 1cm submucosal nodule in antrum, 2cm sessile polyp in duod. 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 ~  f/u DrPatterson 6/10- continue KAPIDEX & reflux regimen... ~  1/12:  he is overdue for f/u w/ GI & we will request appt. ~  11/12: he saw DrJacobs for f/u eval & had EGD12/12 showing Schatzki's ring, sessile duod polyp (Bx=tubular adenoma), unchanged submucosal nodule...  Hx of Hyperplastic COLONIC POLYP in 1999 - all followed by DrPatterson;  ~  his last colonoscopy was 8/04 and normal without recurrent polyps... ~  f/u colonoscopy 8/09 by DrPatterson showed 2mm polyp, bx= hyperplastic, f/u planned 23yrs.  Hx Elevated LFT's - routine CPX 12/08 w/ abn LFTs- this was felt to be secondary to his Statin therapy... he was told to stop the Statin and f/u labs in one month... he requested a liver evaluation from DrPatterson and he was seen 12/09 at which time DrPatterson rechecked his LFT's and they were all back to normal... he also checked the following: ~  AbdSonar w/ echodense liver, no lesions or GB problems... ~  Viral Hepatitis serologies were all negative, including HepC << 8/12: CDC came out  w/ new rec that all Boomers should be checked for HepC. ~  A1AT level was normal ~  Ceruloplasmin level was normal ~  ANA was neg, and Antimitochondrial AB/ Anti Smooth Muscle Ab were neg as well... ~  Serum Fe was normal & B12/Folate levels were normal as well... ~  2/12 NOTE:  LFTs have remained WNL & he is on Cres10.  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 ~  1/12: he reports Rx by DrTannenbaum w/ AXIRON 30mg  per actuation- use under each arm daily; Testos level 12/11= 575 on Rx.  OSTEOARTHRITIS, Hx of BACK PAIN - as noted he feels he must take the CELEBREX daily as his pain (esp R knee) is much worse when he doesn't take it;  he sees DrOlin and had a RKnee arthroscopy 9/07> he uses Glucosamine, MVI, Vit D & Folic... ~  7/10:  he requests Rx for Celebrex 100mg  #90- take 1 cap daily as needed (advised just Prn use). ~  8/12:  Pt requests generic substitute for Celebrex (try ETODOLAC 400mg ) & referral to DrDeveshwar for Rheumatology eval... ~  12/12:  Seen by DrDeveshwar for his OA, left shoulder pain w/ rotator cuff tear per DrCollins, tried shot; he has OA in hands and feet; rec Tramadol vs Etodolac Rx...  Skin Cancers - he is followed by Dr Donzetta Starch w/ [facility] Cream skin ca therapy- skin peel that attacks cancer cells.  HEALTH MAINTENANCE - PNEUMOVAX given age 70 (2011);  he get the yearly seasonal Flu vaccine every fall;  he had a shingles vaccine as well;  had tetanus shot 2005 w/ trip to [place];  up-to-date on colonoscopy done 2009; prostate checks by DrTannenbaum yearly...   Past Surgical History  Procedure Date  . S/p right knee arthroscopy 2005  .  Rca stenting   . Hernia surgery x 3   . Oral surg to removed growth from ?sinus  10/2010    ?Dermoid removed by DrRiggs  . Colonoscopy   . Upper gastrointestinal endoscopy     Outpatient Encounter Prescriptions as of 07/10/2011  Medication Sig Dispense Refill  . Alum & Mag Hydroxide-Simeth (MAGIC MOUTHWASH) SOLN Take by mouth. 1 tsp gargle and swallow up to 4 times daily as needed       . aspirin (ADULT ASPIRIN EC LOW STRENGTH) 81 MG EC tablet Take 81 mg by mouth daily.        Marland Kitchen b complex vitamins tablet Take 1 tablet by mouth daily.        . carvedilol (COREG) 12.5 MG tablet Take 1 tablet (12.5 mg total) by mouth 2 (two) times daily with a meal.  180 tablet  3  . clopidogrel (PLAVIX) 75 MG tablet Take 1 tablet (75 mg total) by mouth daily.  90 tablet  2  . Coenzyme Q10 (CO Q 10) 100 MG CAPS Take 1 capsule by mouth daily.        Marland Kitchen etodolac (LODINE) 400 MG tablet Take 400 mg by mouth 2 (two) times daily as needed.       . fish oil-omega-3 fatty acids 1000 MG capsule Take 2 g by mouth daily.        . Flaxseed, Linseed, (FLAX SEED OIL) 1000 MG CAPS Take 1 capsule by mouth daily.        . folic acid (FOLVITE) 400 MCG tablet Take 400 mcg by mouth daily.        . Glucosamine-Chondroitin 1500-1200 MG/30ML LIQD Take 1 tablet by mouth daily.        Marland Kitchen losartan (COZAAR) 100 MG tablet Take 1 tablet (100 mg total) by mouth daily.  90 tablet  3  . Multiple Vitamin (MULTIVITAMIN) capsule Take 1 capsule by mouth daily.        . niacin (SLO-NIACIN) 500 MG tablet Take 500 mg by mouth daily.        . pantoprazole (PROTONIX) 40 MG tablet One tablet by mouth once daily       . rosuvastatin (CRESTOR) 10 MG tablet Take 1 tablet (10 mg total) by mouth daily.  90 tablet  3  . Testosterone (AXIRON) 30 MG/ACT SOLN Place onto the skin as directed.        . traMADol (ULTRAM) 50 MG tablet Take 50 mg by mouth every 6 (six) hours as needed. One tablet by mouth three times a day        Allergies  Allergen Reactions  . Sulfamethoxazole W/Trimethoprim     REACTION: pt states allergic to SULFA drugs/  Rash  . Sulfonamide Derivatives     REACTION: rash    Current Medications, Allergies, Past Medical History, Past Surgical History, Family History, and Social History were reviewed in [employer] record.     Review of Systems       See HPI - all other systems neg except as noted...       The patient complains of dyspnea on exertion.  The patient denies anorexia, fever, weight loss, weight gain, vision loss, decreased hearing, hoarseness, chest pain, syncope, peripheral edema, prolonged cough, headaches, hemoptysis, abdominal pain, melena, hematochezia, severe indigestion/heartburn, hematuria, incontinence, muscle weakness, suspicious skin lesions, transient blindness, difficulty walking, depression, unusual weight change, abnormal bleeding, enlarged lymph nodes, and angioedema.     Objective:   Physical Exam  WD, WN, 66 y/o WM in NAD... GENERAL:  Alert & oriented; pleasant & cooperative. HEENT:  [place]/AT, EOM-wnl, PERRLA, EACs-clear, TMs-wnl, NOSE-clear, THROAT-clear & wnl. NECK:  Supple w/ fairROM; no JVD; normal carotid impulses w/o bruits; no thyromegaly or nodules palpated; no lymphadenopathy. CHEST:  Clear to P & A; without wheezes/ rales/ or rhonchi. HEART:  Regular Rhythm; without murmurs/ rubs/ or gallops. ABDOMEN:  Soft & nontender; normal bowel sounds; no organomegaly or masses detected. EXT: without deformities, mild arthritic changes; no varicose veins/ venous insuffic/ or edema. NEURO:  CN's intact; motor testing normal; sensory testing normal; gait normal & balance OK. DERM:  No lesions noted; no rash etc...  RADIOLOGY DATA:  Reviewed in the EPIC EMR & discussed w/ the patient...  LABORATORY DATA:  Reviewed in the EPIC EMR & discussed w/ the patient...   Assessment & Plan:   OSA>  He uses his CPAP compliantly w/o problems or daytime hypersomnolence...  HBP>  Controlled on Coreg & Losartan; continue same...  CAD>  followed by DrCooper & most  recent 2DEcho w/ reduced LVF==> further w/u in progress...  HYPERLIPID>  Stable on Crestor, Niaspan, Fish Oil, etc; needs better low fat diet...  GI> HH, Hx stricture, s/p removal of duod adenoma>  Followed by DrPatterson & DrJacobs...  GI> Hx colon polyps>  F/u colon due 8/14 per DrPatterson...  GU> BPH, Low-T, ED>  Followed & managed by DrTannenbaum on AXIRON...  DJD>  He has noted that he MUST take the Celebrex every day; asking for Rheum referral & cheaper NDAID (try ETODOLAC 400mg  Bid prn)...   Patient's Medications  New Prescriptions   No medications on file  Previous Medications   ALUM & MAG HYDROXIDE-SIMETH (MAGIC MOUTHWASH) SOLN    Take by mouth. 1 tsp gargle and swallow up to 4 times daily as needed    ASPIRIN (ADULT ASPIRIN EC LOW STRENGTH) 81 MG EC TABLET    Take 81 mg by mouth daily.     B COMPLEX VITAMINS TABLET    Take 1 tablet by mouth daily.     CARVEDILOL (COREG) 12.5 MG TABLET    Take 1 tablet (12.5 mg total) by mouth 2 (two) times daily with a meal.   CLOPIDOGREL (PLAVIX) 75 MG TABLET    Take 1 tablet (75 mg total) by mouth daily.   COENZYME Q10 (CO Q 10) 100 MG CAPS    Take 1 capsule by mouth daily.     ETODOLAC (LODINE) 400 MG TABLET    Take 400 mg by mouth 2 (two) times daily as needed.    FISH OIL-OMEGA-3 FATTY ACIDS 1000 MG CAPSULE    Take 2 g by mouth daily.     FLAXSEED, LINSEED, (FLAX SEED OIL) 1000 MG CAPS    Take 1 capsule by mouth daily.     FOLIC ACID (FOLVITE) 400 MCG TABLET    Take 400 mcg by mouth daily.     GLUCOSAMINE-CHONDROITIN 1500-1200 MG/30ML LIQD    Take 1 tablet by mouth daily.     LOSARTAN (COZAAR) 100 MG TABLET    Take 1 tablet (100 mg total) by mouth daily.   MULTIPLE VITAMIN (MULTIVITAMIN) CAPSULE    Take 1 capsule by mouth daily.     NIACIN (SLO-NIACIN) 500 MG TABLET    Take 500 mg by mouth daily.     PANTOPRAZOLE (PROTONIX) 40 MG TABLET    One tablet by mouth once daily    ROSUVASTATIN (CRESTOR) 10 MG TABLET  Take 1 tablet (10 mg  total) by mouth daily.   TESTOSTERONE (AXIRON) 30 MG/ACT SOLN    Place onto the skin as directed.     TRAMADOL (ULTRAM) 50 MG TABLET    Take 50 mg by mouth every 6 (six) hours as needed. One tablet by mouth three times a day  Modified Medications   No medications on file  Discontinued Medications   No medications on file

## 2011-07-12 DIAGNOSIS — M1710 Unilateral primary osteoarthritis, unspecified knee: Secondary | ICD-10-CM | POA: Diagnosis not present

## 2011-07-12 DIAGNOSIS — IMO0002 Reserved for concepts with insufficient information to code with codable children: Secondary | ICD-10-CM | POA: Diagnosis not present

## 2011-07-22 ENCOUNTER — Ambulatory Visit (HOSPITAL_COMMUNITY): Payer: Medicare Other | Attending: Internal Medicine

## 2011-07-22 DIAGNOSIS — I2510 Atherosclerotic heart disease of native coronary artery without angina pectoris: Secondary | ICD-10-CM

## 2011-07-22 DIAGNOSIS — E785 Hyperlipidemia, unspecified: Secondary | ICD-10-CM | POA: Insufficient documentation

## 2011-07-22 DIAGNOSIS — R42 Dizziness and giddiness: Secondary | ICD-10-CM | POA: Insufficient documentation

## 2011-07-22 DIAGNOSIS — R0602 Shortness of breath: Secondary | ICD-10-CM

## 2011-07-22 DIAGNOSIS — I10 Essential (primary) hypertension: Secondary | ICD-10-CM | POA: Diagnosis not present

## 2011-07-22 DIAGNOSIS — G4733 Obstructive sleep apnea (adult) (pediatric): Secondary | ICD-10-CM | POA: Insufficient documentation

## 2011-07-22 DIAGNOSIS — R0989 Other specified symptoms and signs involving the circulatory and respiratory systems: Secondary | ICD-10-CM | POA: Insufficient documentation

## 2011-07-22 DIAGNOSIS — R0609 Other forms of dyspnea: Secondary | ICD-10-CM | POA: Diagnosis not present

## 2011-07-30 ENCOUNTER — Telehealth: Payer: Self-pay

## 2011-07-30 DIAGNOSIS — I2510 Atherosclerotic heart disease of native coronary artery without angina pectoris: Secondary | ICD-10-CM

## 2011-07-30 DIAGNOSIS — R0602 Shortness of breath: Secondary | ICD-10-CM

## 2011-07-30 NOTE — Telephone Encounter (Signed)
Pt aware of Echo results.  I will order Cardiac MRI per Dr Excell Seltzer.  Notes Recorded by Micheline Chapman, MD on 07/22/2011 at 9:18 PM LVEF much lower than in past. Poor acoustic windows, recommend cardiac MRI to better quantify LV function

## 2011-07-31 ENCOUNTER — Telehealth: Payer: Self-pay | Admitting: Cardiovascular Disease

## 2011-07-31 NOTE — Telephone Encounter (Signed)
New Msg: Pt calling stating that he needs RX's called into local pharmacy. Pt is out of town and forgot medications. [facility] [address]  [place], [place] [phone].  Please call pt to determine what medication pt needs called in.

## 2011-07-31 NOTE — Telephone Encounter (Signed)
The [facility] [place], [place]--correct [phone].  I spoke with the pharmacist and called in a 7 day supply of Crestor, Losartan, Carvedilol, Plavix and Pantoprozole.

## 2011-08-07 ENCOUNTER — Ambulatory Visit (HOSPITAL_COMMUNITY)
Admission: RE | Admit: 2011-08-07 | Discharge: 2011-08-07 | Disposition: A | Discharge status: Home / Self Care | Payer: Medicare Other | Source: Ambulatory Visit | Attending: Cardiovascular Disease | Admitting: Cardiovascular Disease

## 2011-08-07 DIAGNOSIS — I2510 Atherosclerotic heart disease of native coronary artery without angina pectoris: Secondary | ICD-10-CM | POA: Insufficient documentation

## 2011-08-07 DIAGNOSIS — R0602 Shortness of breath: Secondary | ICD-10-CM | POA: Diagnosis not present

## 2011-08-07 MED ORDER — GADOBENATE DIMEGLUMINE 529 MG/ML IV SOLN
24.0000 mL | Freq: Once | INTRAVENOUS | Status: AC
  Administered 2011-08-07: 24 mL via INTRAVENOUS

## 2011-08-14 ENCOUNTER — Encounter: Payer: Self-pay | Admitting: Cardiovascular Disease

## 2011-08-14 NOTE — Telephone Encounter (Signed)
New Problem:     Patient had an MRI done last week and would like to discuss this with you.  Please call back.

## 2011-08-14 NOTE — Telephone Encounter (Signed)
This encounter was created in error - please disregard.

## 2011-08-26 ENCOUNTER — Telehealth: Payer: Self-pay | Admitting: Pulmonary Disease

## 2011-08-26 DIAGNOSIS — H524 Presbyopia: Secondary | ICD-10-CM | POA: Diagnosis not present

## 2011-08-26 DIAGNOSIS — H2510 Age-related nuclear cataract, unspecified eye: Secondary | ICD-10-CM | POA: Diagnosis not present

## 2011-08-26 MED ORDER — AMOXICILLIN-POT CLAVULANATE 875-125 MG PO TABS: 1.0000 | ORAL_TABLET | Freq: Two times a day (BID) | ORAL | Status: AC

## 2011-08-26 MED ORDER — METHYLPREDNISOLONE 4 MG PO KIT: PACK | ORAL | Status: AC

## 2011-08-26 NOTE — Telephone Encounter (Signed)
Spoke with the pt and he states x 1 week he has been having sinus pain and pressure, nasal congestion with green mucus, and sore throat. He denies any chest congestion or cough, SOB, or fever. He has tried sudafed OTC without relief. Please advise.Carron Curie, CMA Allergies  Allergen Reactions  . Sulfamethoxazole W/Trimethoprim     REACTION: pt states allergic to SULFA drugs/ Rash  . Sulfonamide Derivatives     REACTION: rash

## 2011-08-26 NOTE — Telephone Encounter (Signed)
Per SN----augmentin 875mg   #14  1 po bid , otc align once daily, otc mucinex 2 po bid with plenty of fluids, otc nasal saline spray every 1 hour while awake  And medrol dosepak as directed.  thanks

## 2011-08-26 NOTE — Telephone Encounter (Signed)
Pt aware of Rx's sent and to get OTC medications.

## 2011-09-23 DIAGNOSIS — M2040 Other hammer toe(s) (acquired), unspecified foot: Secondary | ICD-10-CM | POA: Diagnosis not present

## 2011-09-23 DIAGNOSIS — M216X9 Other acquired deformities of unspecified foot: Secondary | ICD-10-CM | POA: Diagnosis not present

## 2011-09-23 DIAGNOSIS — Q828 Other specified congenital malformations of skin: Secondary | ICD-10-CM | POA: Diagnosis not present

## 2011-09-23 DIAGNOSIS — D492 Neoplasm of unspecified behavior of bone, soft tissue, and skin: Secondary | ICD-10-CM | POA: Diagnosis not present

## 2011-10-03 DIAGNOSIS — M2020 Hallux rigidus, unspecified foot: Secondary | ICD-10-CM | POA: Diagnosis not present

## 2011-10-03 DIAGNOSIS — M7750 Other enthesopathy of unspecified foot: Secondary | ICD-10-CM | POA: Diagnosis not present

## 2011-10-03 DIAGNOSIS — M2040 Other hammer toe(s) (acquired), unspecified foot: Secondary | ICD-10-CM | POA: Diagnosis not present

## 2011-10-09 DIAGNOSIS — J328 Other chronic sinusitis: Secondary | ICD-10-CM | POA: Diagnosis not present

## 2011-10-09 DIAGNOSIS — G4733 Obstructive sleep apnea (adult) (pediatric): Secondary | ICD-10-CM | POA: Diagnosis not present

## 2011-12-16 DIAGNOSIS — Z79899 Other long term (current) drug therapy: Secondary | ICD-10-CM | POA: Diagnosis not present

## 2011-12-17 ENCOUNTER — Other Ambulatory Visit: Payer: Self-pay | Admitting: Dermatology

## 2011-12-17 DIAGNOSIS — L570 Actinic keratosis: Secondary | ICD-10-CM | POA: Diagnosis not present

## 2011-12-23 DIAGNOSIS — L821 Other seborrheic keratosis: Secondary | ICD-10-CM | POA: Diagnosis not present

## 2011-12-23 DIAGNOSIS — L259 Unspecified contact dermatitis, unspecified cause: Secondary | ICD-10-CM | POA: Diagnosis not present

## 2011-12-23 DIAGNOSIS — D239 Other benign neoplasm of skin, unspecified: Secondary | ICD-10-CM | POA: Diagnosis not present

## 2011-12-23 DIAGNOSIS — D485 Neoplasm of uncertain behavior of skin: Secondary | ICD-10-CM | POA: Diagnosis not present

## 2011-12-31 DIAGNOSIS — M7750 Other enthesopathy of unspecified foot: Secondary | ICD-10-CM | POA: Diagnosis not present

## 2012-01-07 ENCOUNTER — Ambulatory Visit (INDEPENDENT_AMBULATORY_CARE_PROVIDER_SITE_OTHER): Payer: Medicare Other | Admitting: Pulmonary Disease

## 2012-01-07 ENCOUNTER — Encounter: Payer: Self-pay | Admitting: Pulmonary Disease

## 2012-01-07 VITALS — BP 120/72 | HR 73 | Temp 97.9°F | Ht 68.0 in | Wt 178.0 lb

## 2012-01-07 DIAGNOSIS — I10 Essential (primary) hypertension: Secondary | ICD-10-CM

## 2012-01-07 DIAGNOSIS — E785 Hyperlipidemia, unspecified: Secondary | ICD-10-CM | POA: Diagnosis not present

## 2012-01-07 DIAGNOSIS — M549 Dorsalgia, unspecified: Secondary | ICD-10-CM

## 2012-01-07 DIAGNOSIS — K319 Disease of stomach and duodenum, unspecified: Secondary | ICD-10-CM

## 2012-01-07 DIAGNOSIS — E291 Testicular hypofunction: Secondary | ICD-10-CM

## 2012-01-07 DIAGNOSIS — K219 Gastro-esophageal reflux disease without esophagitis: Secondary | ICD-10-CM

## 2012-01-07 DIAGNOSIS — G4733 Obstructive sleep apnea (adult) (pediatric): Secondary | ICD-10-CM | POA: Diagnosis not present

## 2012-01-07 DIAGNOSIS — N400 Enlarged prostate without lower urinary tract symptoms: Secondary | ICD-10-CM

## 2012-01-07 DIAGNOSIS — D126 Benign neoplasm of colon, unspecified: Secondary | ICD-10-CM

## 2012-01-07 DIAGNOSIS — I2510 Atherosclerotic heart disease of native coronary artery without angina pectoris: Secondary | ICD-10-CM | POA: Diagnosis not present

## 2012-01-07 DIAGNOSIS — K449 Diaphragmatic hernia without obstruction or gangrene: Secondary | ICD-10-CM

## 2012-01-07 DIAGNOSIS — M1990 Unspecified osteoarthritis, unspecified site: Secondary | ICD-10-CM

## 2012-01-07 NOTE — Patient Instructions (Addendum)
Today we updated your med list in our EPIC system...    Continue your current medications the same...  We reviewed your recent lab work from [facility]...  Good luck w/ the foot surg in November...  Call for any problems...  Lets plan a follow up visit in 6 months, sooner if needed for any problems.Marland KitchenMarland Kitchen

## 2012-01-07 NOTE — Progress Notes (Signed)
Subjective:    Patient ID: [name], male    DOB: Feb 19, 1946, 66 y.o.   MRN: [id number]  HPI 66 y/o WM here for a follow up visit... he has multiple medical problems as noted below...    ~  December 20, 2009:  generally stable overall- notes some nocturnal CTS symptoms but doesn't want to use wrist splints; exercising daily w/ treadmill, etc- notes tired & SOB after 45 min work out or if taking stairs... OSA stable on CPAP; BP controlled on meds & denies angina, palpit, etc; Lipids stable on Cres10, Niacin, FishOil; denies any GI concerns at present; saw DrTannenbaum recently w/ PSA reported at 1.3 but found "Low-T" & planning more tests; still taking Celebrex regularly for his DJD; requests refill MMW for Prn use...   ~  June 18, 2010:  he's had a good 431mo> still c/o bilat CTS symtpms in the AM & he will try the wrist splints Qhs...  he was started on AXIRON topical testosterone Rx per DrTannenbaum & improved... he saw DrCooper 8/11 & stable from the CV standpoint- no changes made... he was sent to [facility] by his dentist for ?sinus ?LN in neck- nothing pathological found & told to watch it (exam today= neg no node palp)... he will ret for Fasting blood work...  ~  January 07, 2011:  15mo ROV & he has been doing well overall but has several requests today> wants generic replacement for Diovan (we discussed Losartan); and wants referral to DrDeveshwar for Rheum eval, plus cheaper Celebrex (we discussed Etodolac)...    His BP is well controlled on Coreg & Diovan> measures 132/82 today & he remains asymptomatic;  FLP last done 2/12 & looked great w/ LDL 51 on Crestor10, but HDL low at 26 despite Niacin & fish oil rx> rec continue same meds, low fat diet, & incr exercise)...    He continues to f/u w/ DrCooper for Cards every 431mo- seen last wk> CAD, s/p stent RCA 2005, LBBB; last cath was 2009 w/ patent RCA stent & mild non-obstructive dis elsewhere; doing well, exercising regularly, no CP/ palpit/  SOB/ edema/ etc; rec to continue ASA/ Plavix,     He saw DrTannenbaum 6/12 for Urology f/u> BPH, Hypogonad- on AXIRON w/ incr energy, better appetite, sl wt gain; rec to continue same Rx...    Finally he reports some type of ENT/ Oral surg 6wks ago> referred by his dentist to [facility] w/ surg thru gums to remove a growth in his sinus= ?dermoid...  ~  July 10, 2011:  431mo ROV & [name] says he is doing fine- no new complaints or concerns; we reviewed his Prob List, Meds, XRays & Labs...    OSA> stable on CPAP w/ supplies Q71mo from [employer]; no issues w/ his machine or interface; rests well, wakes refreshed, no daytime hypersomnolence & no prob w/ alertness...    HBP & CAD, s/p stent in RCA 2005, LBBB, EF=45-50% w/ septal dysynergy> followed by DrCooper on ASA/ Plavix/ Coreg12.5Bid/ Cozaar100; BP= 118/64 & he denies CP, palpit, dizzy, SOB, edema...    CHOL> on Cres10, slo-niacin, FishOil, CoQ10, etc; FLP 2/13 showed TChol 124, TG 167, HDL 33, LDL 57; needs better low fat diet...    GI> HH, Hx stricture, antral nodule=adenoma> on Protonix40 followed by DrPatterson & Christella Hartigan (see below)...    GU> BPH, LTOS, Low-T> on Axiron; he relates a story about GU symptoms & extensive work up in their office "doing just fine" he says, incr  energy, etc; treated for seminal vesiculitis w/ Doxy x3wks...    Ortho> DJD, LBP> prev on Celebrex & switched to generic Etodolac; he notes that he's been hurting since he was 66 y/o w/ a strong fam hx arthritis; he requested referral to Rheum & he saw DrDeveshwar but she indicated that anti-inflamm meds wouldn't help & he should take pain med 3/d or as needed...    Derm> Hx skin cancers treated by DrDJones... LABS 2/13:  FLP- elevTG & lowHDL on Cres10;  Chems- wnl;  CBC- wnl;  TSH=1.91;  PSA=2.08;  VitD=58;  UA- clear  ~  January 07, 2012:  51mo ROV & [place] is feeling well overall just c/o some intermittent nausea after eating & we discussed further eval by DrJacobs (GI) when he is  ready> he had neg Sonar 2009 x for incr liver echodensity suggesting some steatosis...  He also notes that he is sched for foot surg 11/13 by Volney Presser...  We reviewed his cardiac eval from DrCooper >>    We reviewed prob list, meds, xrays and labs> see below>> LABS 7/13 by DrDeveshwar:  Chems- wnl x BS=109;  CBC- wnl           PROBLEM LIST:  ENT >> some type of ENT/ Oral surg 7/12> referred by his dentist to [facility] w/ surg thru gums to remove a growth in his sinus= ?dermoid...  OBSTRUCTIVE SLEEP APNEA - Hx OSA w/ sleep study 11/02 RDI=18 and desat to 85%; seen by DrClance and on CPAP rx;  he's been using it regularly, denies daytime hypersomnolence, etc;  He gets machine check & new mask Q79mo thru [employer] & he reports download showed 12,000 hrs use! ~  CXR 3/10 clear w/ min left basilar scarring... ~  Pt gets his CPAP machine checked & masks replaced every 51mo thru [place]... ~  CXR 10/12 showed normal heart size, clear lungs x left base atx, mild degen changes in TSpine...  Episode of HEMOPTYSIS - Hosp 4/10 after episode of BRB hemoptysis> full eval was neg, no recurrence... he had bronchoscopy- neg,  ENT eval DrRosen- neg, noted some persistant cough treated w/ Mucinex DM, Zyrtek, Tussionex...  no recurrence.  HBP & CAD-S/P stent in RCA on 2005 - followed regularly by DrCooper on ASA 81mg /d,  PLAVIX 75mg /d,  COREG 12.5mg Bid,  LOSARTAN 100mg /d...  BP= 120/72 today & similar at home> denies HA, fatigue, visual changes, CP, palipit, dizziness, syncope, dyspnea, edema, etc...  ~  EKG w/ baseline LBBB;  ~  Adenosine MRI @ [facility] 9/07= no ischemia & EF=50-55;  ~  last cath 3/06 w/ non-obstructive 3 vessel dz (20% lesions in all 3) & mild global HK w/ EF=45-50%;   ~  last cardiolite 2/06 without ischemia or infarction, EF=44% w/ abn septal motion; ~  2DEcho 7/09 showed septal dyssynergy related to the IVCD, EF= 45-50 %, mild focal basal septal hypertrophy, doppler parameters were consistent with  abnormal left ventricular relaxation, mild MR... ~  2/13: he had f/u DrCooper> still c/o fatigue & dyspnea; no angina, BP controlled, BNP=25, EKG showed NSR, rate72, LBBB;  2DEcho & cardiac MRI are planned... ~  2DEcho 3/13 showed decr EF=30% w/ HK, mild LVH, Gr1DD, mild MR ~  3/13:  Cardiac MRI showed mild LVenlargement, mild LAE, no scar or infarct, no ischemia, no regional wall motion abn, but there was abn septal motion & EF=48%  HYPERLIPIDEMIA - he follows a low fat diet and takes CRESTOR 10mg /d, NIACIN 500mg /d, Fish Oil, Flax Seed Oil, &  CoQ10... ~  FLP 9/08 showed TChol 110, TG 82, HDL 28, LDL 66 ~  FLP 8/09 showed TChol 177, TG 130, HDL 36, LDL 115 ~  FLP 1/10 showed TChol 114, TG 109, HDL 31, LDL 62 ~  FLP 2/11 showed TChol 103, TG 85, HDL 36, LDL 51 ~  FLP 2/12 showed TChol 101, TG 121, HDL 26, LDL 51 ~  FLP 2/13 on Cres10+Niacin500 showed TChol 124, TG 167, HDL 33, LDL 57  HIATAL HERNIA, Hx of ESOPHAGEAL STRICTURE, OTHER SPECIFIED DISORDER OF STOMACH AND DUODENUM (ICD-537.89) >> he gets the [facility], along w/ the similar product from [employer]- "I read too much"- he stopped PPI/ Dexilant Rx in favor of "Vinegar & Honey- it works as well per the [employer]"...  ~  he had an EGD 6/08 showing a 3 cm HH & distal esoph stricture dilated;  Rx Pepcid 20mg /d... ~  EGD 09/02/08 by DrPerry showed a HH, schatzki ring, 1cm submucosal nodule in antrum, 2cm sessile polyp in duod. 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 ~  f/u DrPatterson 6/10- continue KAPIDEX & reflux regimen... ~  1/12:  he is overdue for f/u w/ GI & we will request appt. ~  11/12: he saw DrJacobs for f/u eval & had EGD12/12 showing Schatzki's ring, sessile duod polyp (Bx=tubular adenoma), unchanged submucosal nodule... ~  8/13:  He lists PROTONIX 40mg /d  on his current med list...  Hx of Hyperplastic COLONIC POLYP in 1999 - all followed by DrPatterson;  ~  his last colonoscopy was 8/04 and normal without recurrent polyps... ~  f/u colonoscopy 8/09 by DrPatterson showed 2mm polyp, bx= hyperplastic, f/u planned 42yrs.  Hx Elevated LFT's - routine CPX 12/08 w/ abn LFTs- this was felt to be secondary to his Statin therapy... he was told to stop the Statin and f/u labs in one month... he requested a liver evaluation from DrPatterson and he was seen 12/09 at which time DrPatterson rechecked his LFT's and they were all back to normal... he also checked the following: ~  AbdSonar w/ echodense liver, no lesions or GB problems... ~  Viral Hepatitis serologies were all negative, including HepC << 8/12: CDC came out w/ new rec that all Boomers should be checked for HepC. ~  A1AT level was normal ~  Ceruloplasmin level was normal ~  ANA was neg, and Antimitochondrial AB/ Anti Smooth Muscle Ab were neg as well... ~  Serum Fe was normal & B12/Folate levels were normal as well... ~  2/12 NOTE:  LFTs have remained WNL & he is on Cres10.  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 ~  1/12: he reports Rx by DrTannenbaum w/ AXIRON 30mg  per actuation- use under each arm daily; Testos level 12/11= 575 on Rx.  OSTEOARTHRITIS, Hx of BACK PAIN - as noted he feels he must take the Celebrex daily as his pain (esp R knee) is much worse when he doesn't take it;  he sees DrOlin and had a RKnee arthroscopy 9/07> he uses Glucosamine, MVI, Vit D & Folic... ~  7/10:  he requests Rx for Celebrex  100mg  #90- take 1 cap daily as needed (advised just Prn use). ~  8/12:  Pt requests generic substitute for Celebrex (try ETODOLAC 400mg ) &  referral to DrDeveshwar for Rheumatology eval... ~  12/12:  Seen by DrDeveshwar for his OA, left shoulder pain w/ rotator cuff tear per DrCollins, tried shot; he has OA in hands and feet; rec Tramadol vs Etodolac Rx...  Skin Cancers - he is followed by Dr Donzetta Starch w/ [facility] Cream skin ca therapy- skin peel that attacks cancer cells.  HEALTH MAINTENANCE - PNEUMOVAX given age 28 (2011);  he get the yearly seasonal Flu vaccine every fall;  he had a shingles vaccine as well;  had tetanus shot 2005 w/ trip to [place];  up-to-date on colonoscopy done 2009; prostate checks by DrTannenbaum yearly...   Past Surgical History  Procedure Date  . S/p right knee arthroscopy 2005  . Rca stenting   . Hernia surgery x 3   . Oral surg to removed growth from ?sinus 10/2010    ?Dermoid removed by DrRiggs  . Colonoscopy   . Upper gastrointestinal endoscopy     Outpatient Encounter Prescriptions as of 01/07/2012  Medication Sig Dispense Refill  . Alum & Mag Hydroxide-Simeth (MAGIC MOUTHWASH) SOLN Take by mouth. 1 tsp gargle and swallow up to 4 times daily as needed       . aspirin (ADULT ASPIRIN EC LOW STRENGTH) 81 MG EC tablet Take 81 mg by mouth daily.        Marland Kitchen b complex vitamins tablet Take 1 tablet by mouth daily.        . carvedilol (COREG) 12.5 MG tablet Take 1 tablet (12.5 mg total) by mouth 2 (two) times daily with a meal.  180 tablet  3  . clopidogrel (PLAVIX) 75 MG tablet Take 1 tablet (75 mg total) by mouth daily.  90 tablet  2  . Coenzyme Q10 (CO Q 10) 100 MG CAPS Take 1 capsule by mouth daily.        Marland Kitchen etodolac (LODINE) 400 MG tablet Take 400 mg by mouth 2 (two) times daily as needed.       . fish oil-omega-3 fatty acids 1000 MG capsule Take 2 g by mouth daily.        . Flaxseed, Linseed, (FLAX SEED OIL) 1000 MG CAPS Take 1 capsule by mouth daily.        . folic acid (FOLVITE) 400 MCG tablet Take 400 mcg by mouth daily.        . Glucosamine-Chondroitin 1500-1200 MG/30ML LIQD Take 1 tablet by  mouth daily.        Marland Kitchen losartan (COZAAR) 100 MG tablet Take 1 tablet (100 mg total) by mouth daily.  90 tablet  3  . Multiple Vitamin (MULTIVITAMIN) capsule Take 1 capsule by mouth daily.        . niacin (SLO-NIACIN) 500 MG tablet Take 500 mg by mouth daily.        . pantoprazole (PROTONIX) 40 MG tablet One tablet by mouth once daily       . rosuvastatin (CRESTOR) 10 MG tablet Take 1 tablet (10 mg total) by mouth daily.  90 tablet  3  . Testosterone (AXIRON) 30 MG/ACT SOLN Place onto the skin as directed.        . traMADol (ULTRAM) 50 MG tablet Take 50 mg by mouth every 6 (six) hours as needed. One tablet by mouth three times a day        Allergies  Allergen Reactions  . Sulfamethoxazole W-Trimethoprim     REACTION: pt states allergic to SULFA drugs/ Rash  . Sulfonamide Derivatives     REACTION: rash    Current Medications, Allergies, Past Medical History, Past Surgical History, Family History, and Social History were reviewed in [employer] record.     Review of Systems       See HPI - all other systems neg except as noted...       The patient complains of dyspnea on exertion.  The patient denies anorexia, fever, weight loss, weight gain, vision loss, decreased hearing, hoarseness, chest pain, syncope, peripheral edema, prolonged cough, headaches, hemoptysis, abdominal pain, melena, hematochezia, severe indigestion/heartburn, hematuria, incontinence, muscle weakness, suspicious skin lesions, transient blindness, difficulty walking, depression, unusual weight change, abnormal bleeding, enlarged lymph nodes, and angioedema.     Objective:   Physical Exam      WD, WN, 66 y/o WM in NAD... GENERAL:  Alert & oriented; pleasant & cooperative. HEENT:  [place]/AT, EOM-wnl, PERRLA, EACs-clear, TMs-wnl, NOSE-clear, THROAT-clear & wnl. NECK:  Supple w/ fairROM; no JVD; normal carotid impulses w/o bruits; no thyromegaly or nodules palpated; no lymphadenopathy. CHEST:  Clear to  P & A; without wheezes/ rales/ or rhonchi. HEART:  Regular Rhythm; without murmurs/ rubs/ or gallops. ABDOMEN:  Soft & nontender; normal bowel sounds; no organomegaly or masses detected. EXT: without deformities, mild arthritic changes; no varicose veins/ venous insuffic/ or edema. NEURO:  CN's intact; motor testing normal; sensory testing normal; gait normal & balance OK. DERM:  No lesions noted; no rash etc...  RADIOLOGY DATA:  Reviewed in the EPIC EMR & discussed w/ the patient...  LABORATORY DATA:  Reviewed in the EPIC EMR & discussed w/ the patient...   Assessment & Plan:    OSA>  He uses his CPAP compliantly w/o problems or daytime hypersomnolence...  HBP>  Controlled on Coreg & Losartan; continue same...  CAD>  followed by DrCooper & most recent 2DEcho & CardiacMRI w/ reduced LVF, his notes are reviewed...  HYPERLIPID>  Stable on Crestor, Niaspan, Fish Oil, etc; needs better low fat diet...  GI> HH, Hx stricture, s/p removal of duod adenoma>  Followed by DrPatterson & DrJacobs...  GI> Hx colon polyps>  F/u colon due 8/14 per DrPatterson...  GU> BPH, Low-T, ED>  Followed & managed by DrTannenbaum on AXIRON...  DJD>  He has seen DrDeveshwar for Rheum  & has Etodolac as needed...    Patient's Medications  New Prescriptions   No medications on file  Previous Medications   ALUM & MAG HYDROXIDE-SIMETH (MAGIC MOUTHWASH) SOLN    Take by mouth. 1 tsp gargle and swallow up to 4 times daily as needed    ASPIRIN (ADULT ASPIRIN EC LOW STRENGTH) 81 MG EC TABLET    Take 81 mg by mouth daily.     B COMPLEX VITAMINS TABLET    Take 1 tablet by mouth daily.     CARVEDILOL (COREG) 12.5 MG TABLET    Take 1 tablet (12.5 mg total) by mouth 2 (two) times daily with a meal.   CLOPIDOGREL (PLAVIX) 75 MG TABLET    Take 1 tablet (75 mg total) by mouth daily.   COENZYME Q10 (CO Q 10) 100 MG CAPS    Take 1 capsule by mouth daily.     ETODOLAC (LODINE) 400 MG TABLET    Take 400 mg by mouth 2 (two)  times daily as needed.    FISH OIL-OMEGA-3 FATTY ACIDS 1000 MG  CAPSULE    Take 2 g by mouth daily.     FLAXSEED, LINSEED, (FLAX SEED OIL) 1000 MG CAPS    Take 1 capsule by mouth daily.     FOLIC ACID (FOLVITE) 400 MCG TABLET    Take 400 mcg by mouth daily.     GLUCOSAMINE-CHONDROITIN 1500-1200 MG/30ML LIQD    Take 1 tablet by mouth daily.     LOSARTAN (COZAAR) 100 MG TABLET    Take 1 tablet (100 mg total) by mouth daily.   MULTIPLE VITAMIN (MULTIVITAMIN) CAPSULE    Take 1 capsule by mouth daily.     NIACIN (SLO-NIACIN) 500 MG TABLET    Take 500 mg by mouth daily.     PANTOPRAZOLE (PROTONIX) 40 MG TABLET    One tablet by mouth once daily    ROSUVASTATIN (CRESTOR) 10 MG TABLET    Take 1 tablet (10 mg total) by mouth daily.   TESTOSTERONE (AXIRON) 30 MG/ACT SOLN    Place onto the skin as directed.     TRAMADOL (ULTRAM) 50 MG TABLET    Take 50 mg by mouth every 6 (six) hours as needed. One tablet by mouth three times a day  Modified Medications   No medications on file  Discontinued Medications   No medications on file

## 2012-01-21 ENCOUNTER — Encounter: Payer: Self-pay | Admitting: Cardiovascular Disease

## 2012-01-21 ENCOUNTER — Ambulatory Visit (INDEPENDENT_AMBULATORY_CARE_PROVIDER_SITE_OTHER): Payer: Medicare Other | Admitting: Cardiovascular Disease

## 2012-01-21 VITALS — BP 132/90 | HR 79 | Ht 68.0 in | Wt 177.8 lb

## 2012-01-21 DIAGNOSIS — E785 Hyperlipidemia, unspecified: Secondary | ICD-10-CM | POA: Diagnosis not present

## 2012-01-21 DIAGNOSIS — I10 Essential (primary) hypertension: Secondary | ICD-10-CM | POA: Diagnosis not present

## 2012-01-21 DIAGNOSIS — I2510 Atherosclerotic heart disease of native coronary artery without angina pectoris: Secondary | ICD-10-CM

## 2012-01-21 MED ORDER — ATORVASTATIN CALCIUM 20 MG PO TABS: 20.0000 mg | ORAL_TABLET | Freq: Every day | ORAL | Status: DC

## 2012-01-21 NOTE — Assessment & Plan Note (Signed)
The patient is stable without anginal symptoms. Will continue his same medical program with the exception of a change in his statin drug because of cost. Lengthy discussion about the need for lifestyle modification.

## 2012-01-21 NOTE — Progress Notes (Signed)
HPI:  Mr. Nooney returns for followup evaluation. He is a 66 year old gentleman with coronary artery disease, mild cardiomyopathy with left bundle branch block, and chronic dyspnea.  The patient continues to complain of dyspnea with exertion. He denies orthopnea, PND, leg swelling, or chest pain/pressure. He's not been exercising as much because of some orthopedic problems. He has not been following a good diet. He admits to eating a lot of sweets at nighttime. Overall he feels about the same as he has in the past with no major changes in his symptoms. He is planning on having a foot surgery in the next few months.  Outpatient Encounter Prescriptions as of 01/21/2012  Medication Sig Dispense Refill  . Alum & Mag Hydroxide-Simeth (MAGIC MOUTHWASH) SOLN Take by mouth. 1 tsp gargle and swallow up to 4 times daily as needed       . ANDROGEL PUMP 20.25 MG/ACT (1.62%) GEL Apply topically as directed. 4 Pumps      . aspirin (ADULT ASPIRIN EC LOW STRENGTH) 81 MG EC tablet Take 81 mg by mouth daily.        Marland Kitchen b complex vitamins tablet Take 1 tablet by mouth daily.        . carvedilol (COREG) 12.5 MG tablet Take 1 tablet (12.5 mg total) by mouth 2 (two) times daily with a meal.  180 tablet  3  . clopidogrel (PLAVIX) 75 MG tablet Take 1 tablet (75 mg total) by mouth daily.  90 tablet  2  . Coenzyme Q10 (CO Q 10) 100 MG CAPS Take 1 capsule by mouth daily.        . fish oil-omega-3 fatty acids 1000 MG capsule Take 2 g by mouth daily.        . folic acid (FOLVITE) 400 MCG tablet Take 400 mcg by mouth daily.        . Glucosamine-Chondroitin 1500-1200 MG/30ML LIQD Take 1 tablet by mouth daily.        . hydrocortisone 2.5 % lotion Apply 1 application topically as needed.       . indomethacin (INDOCIN) 25 MG capsule Take 25 mg by mouth 2 (two) times daily with a meal.       . losartan (COZAAR) 100 MG tablet Take 1 tablet (100 mg total) by mouth daily.  90 tablet  3  . Multiple Vitamin (MULTIVITAMIN) capsule Take 1  capsule by mouth daily.        . niacin (SLO-NIACIN) 500 MG tablet Take 500 mg by mouth daily.        . pantoprazole (PROTONIX) 40 MG tablet One tablet by mouth once daily       . rosuvastatin (CRESTOR) 10 MG tablet Take 1 tablet (10 mg total) by mouth daily.  90 tablet  3  . traMADol (ULTRAM) 50 MG tablet Take 50 mg by mouth every 6 (six) hours as needed. One tablet by mouth three times a day      . DISCONTD: etodolac (LODINE) 400 MG tablet Take 400 mg by mouth 2 (two) times daily as needed.       Marland Kitchen DISCONTD: Flaxseed, Linseed, (FLAX SEED OIL) 1000 MG CAPS Take 1 capsule by mouth daily.        Marland Kitchen DISCONTD: Testosterone (AXIRON) 30 MG/ACT SOLN Place onto the skin as directed.          Allergies  Allergen Reactions  . Sulfamethoxazole W-Trimethoprim     REACTION: pt states allergic to SULFA drugs/ Rash  . Sulfonamide  Derivatives     REACTION: rash    Past Medical History  Diagnosis Date  . HTN (hypertension)   . CAD (coronary artery disease)   . Hyperlipidemia   . Allergic rhinitis   . OSA (obstructive sleep apnea)   . Hemoptysis   . Hiatal hernia   . GERD (gastroesophageal reflux disease)   . Esophageal stricture   . 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 History of colonic polyps     hyperplastic  . Hypertrophy of prostate with urinary obstruction and other lower urinary tract symptoms (LUTS)   . Testicular hypofunction   . OA (osteoarthritis)   . Chronic back pain     intermittent  . Dizziness   . Hx of colonoscopy   . CHF (congestive heart failure)     ROS: Positive for bilateral foot pain, otherwise negative except as per HPI  BP 132/90  Pulse 79  Ht 5\' 8"  (1.727 m)  Wt 80.65 kg (177 lb 12.8 oz)  BMI 27.03 kg/m2  PHYSICAL EXAM: Pt is alert and oriented, NAD HEENT: normal Neck: JVP - normal, carotids 2+= without bruits Lungs: CTA bilaterally CV: RRR without murmur or  gallop Abd: soft, NT, Positive BS, no hepatomegaly Ext: no C/C/E, distal pulses intact and equal Skin: warm/dry no rash  EKG:  Normal sinus rhythm with left bundle branch block 79 beats per minute  ASSESSMENT AND PLAN:

## 2012-01-21 NOTE — Assessment & Plan Note (Signed)
Lipids carefully reviewed. He's having some flushing with niacin and he would like to change from Crestor because of cost. His LDL is excellent and I recommended that he change to a dose equivalent of atorvastatin 20 mg daily. He's had significant hypertriglyceridemia in the past and will remain on low-dose niacin as he has tolerated this for many years

## 2012-01-21 NOTE — Assessment & Plan Note (Signed)
Blood pressure is well controlled on current medical program. 

## 2012-01-21 NOTE — Patient Instructions (Addendum)
Your physician has recommended you make the following change in your medication: STOP Crestor, START Atorvastatin 20mg  take one by mouth at bedtime  Your physician recommends that you return for a FASTING LIPID and LIVER in 3 MONTHS (04/21/12)--nothing to eat or drink after midnight, lab opens at 8:30  Your physician wants you to follow-up in: 6 MONTHS with Dr Excell Seltzer.  You will receive a reminder letter in the mail two months in advance. If you don't receive a letter, please call our office to schedule the follow-up appointment.

## 2012-02-13 DIAGNOSIS — M19049 Primary osteoarthritis, unspecified hand: Secondary | ICD-10-CM | POA: Diagnosis not present

## 2012-02-13 DIAGNOSIS — M25579 Pain in unspecified ankle and joints of unspecified foot: Secondary | ICD-10-CM | POA: Diagnosis not present

## 2012-02-13 DIAGNOSIS — M1710 Unilateral primary osteoarthritis, unspecified knee: Secondary | ICD-10-CM | POA: Diagnosis not present

## 2012-03-13 ENCOUNTER — Telehealth: Payer: Self-pay | Admitting: Pulmonary Disease

## 2012-03-13 MED ORDER — AMOXICILLIN-POT CLAVULANATE 875-125 MG PO TABS: 1.0000 | ORAL_TABLET | Freq: Two times a day (BID) | ORAL | Status: DC

## 2012-03-13 MED ORDER — MAGIC MOUTHWASH: ORAL | Status: DC

## 2012-03-13 NOTE — Telephone Encounter (Signed)
Pt is c/o sore throat, nasal congestion, sinus congestion, productive cough with yellow phlegm, chest tightness. Per SN give augmentin 875 1 po bid #14, and MMW. Rx sent to [facility] in [place] per pt. Pt is aware.  Carron Curie, CMA

## 2012-04-21 ENCOUNTER — Other Ambulatory Visit: Payer: Medicare Other

## 2012-04-26 DIAGNOSIS — Z23 Encounter for immunization: Secondary | ICD-10-CM | POA: Diagnosis not present

## 2012-04-29 DIAGNOSIS — L723 Sebaceous cyst: Secondary | ICD-10-CM | POA: Diagnosis not present

## 2012-04-29 DIAGNOSIS — E291 Testicular hypofunction: Secondary | ICD-10-CM | POA: Diagnosis not present

## 2012-05-18 ENCOUNTER — Telehealth: Payer: Self-pay | Admitting: Cardiovascular Disease

## 2012-05-18 NOTE — Telephone Encounter (Signed)
Pt needs to stop asa and plavix five days prior to foot surgery on 05-29-12, pls call [phone]

## 2012-05-18 NOTE — Telephone Encounter (Signed)
Patient called stated he is scheduled for foot surgery 05/30/11,needs to stop aspirin and plavix 5 days prior to surgery.Message sent to Dr.Cooper for advice.

## 2012-05-21 NOTE — Telephone Encounter (Signed)
This is ok. His CV risk is low as he is several years out from PCI. No new symptoms at time of last evaluation.  [name] 05/21/2012 8:57 PM

## 2012-05-22 NOTE — Telephone Encounter (Signed)
I spoke with the pt and made him aware of Dr Earmon Phoenix recommendation.  The pt will resume medications ASAP after procedure and will make sure this is okay with Dr Lestine Box.

## 2012-05-29 DIAGNOSIS — M2020 Hallux rigidus, unspecified foot: Secondary | ICD-10-CM | POA: Diagnosis not present

## 2012-05-29 DIAGNOSIS — G8918 Other acute postprocedural pain: Secondary | ICD-10-CM | POA: Diagnosis not present

## 2012-06-03 ENCOUNTER — Telehealth: Payer: Self-pay | Admitting: Cardiovascular Disease

## 2012-06-03 NOTE — Telephone Encounter (Signed)
Pt calling re out of carvedilol, primemail requested last week, pt needs called in asap or will not have enough to last until mail order arrives, would like 90 days

## 2012-06-16 DIAGNOSIS — L821 Other seborrheic keratosis: Secondary | ICD-10-CM | POA: Diagnosis not present

## 2012-06-16 DIAGNOSIS — L570 Actinic keratosis: Secondary | ICD-10-CM | POA: Diagnosis not present

## 2012-06-16 DIAGNOSIS — L219 Seborrheic dermatitis, unspecified: Secondary | ICD-10-CM | POA: Diagnosis not present

## 2012-06-16 DIAGNOSIS — L819 Disorder of pigmentation, unspecified: Secondary | ICD-10-CM | POA: Diagnosis not present

## 2012-06-16 DIAGNOSIS — D1801 Hemangioma of skin and subcutaneous tissue: Secondary | ICD-10-CM | POA: Diagnosis not present

## 2012-07-14 DIAGNOSIS — M1710 Unilateral primary osteoarthritis, unspecified knee: Secondary | ICD-10-CM | POA: Diagnosis not present

## 2012-07-14 DIAGNOSIS — M19079 Primary osteoarthritis, unspecified ankle and foot: Secondary | ICD-10-CM | POA: Diagnosis not present

## 2012-07-14 DIAGNOSIS — M19049 Primary osteoarthritis, unspecified hand: Secondary | ICD-10-CM | POA: Diagnosis not present

## 2012-07-16 ENCOUNTER — Other Ambulatory Visit: Payer: Self-pay | Admitting: *Deleted

## 2012-07-16 DIAGNOSIS — I2510 Atherosclerotic heart disease of native coronary artery without angina pectoris: Secondary | ICD-10-CM

## 2012-07-16 MED ORDER — LOSARTAN POTASSIUM 100 MG PO TABS: 100.0000 mg | ORAL_TABLET | Freq: Every day | ORAL | Status: DC

## 2012-07-16 MED ORDER — CLOPIDOGREL BISULFATE 75 MG PO TABS: 75.0000 mg | ORAL_TABLET | Freq: Every day | ORAL | Status: DC

## 2012-07-16 MED ORDER — CARVEDILOL 12.5 MG PO TABS: 12.5000 mg | ORAL_TABLET | Freq: Two times a day (BID) | ORAL | Status: DC

## 2012-07-17 DIAGNOSIS — G5600 Carpal tunnel syndrome, unspecified upper limb: Secondary | ICD-10-CM | POA: Diagnosis not present

## 2012-07-27 ENCOUNTER — Telehealth: Payer: Self-pay | Admitting: Cardiovascular Disease

## 2012-07-27 NOTE — Telephone Encounter (Signed)
Will forward to Lauren and Dr. Excell Seltzer.  Pt's Rx insurance company is questioning why pt is still on Plavix.  Pt's last cath was 2005 with placement of drug-eluding stent.

## 2012-07-27 NOTE — Telephone Encounter (Signed)
New problem   Billy  Green is questioning why he taken plavix.   Phone # [phone]

## 2012-07-28 NOTE — Telephone Encounter (Signed)
Dr Excell Seltzer will review this pt's chart in regards to plavix.

## 2012-07-30 NOTE — Telephone Encounter (Signed)
He is at low-risk of stopping plavix, now many years out from stenting. He should continue on ASA 81 mg daily.  Tonny Bollman 07/30/2012 11:13 PM

## 2012-07-31 NOTE — Telephone Encounter (Signed)
I spoke with the pt and made him aware that he can discontinue plavix at this time.  The pt verbalized understanding that he must remain on ASA 81mg  daily. The pt is scheduled to see Dr Excell Seltzer on 08/11/12.

## 2012-08-06 DIAGNOSIS — E291 Testicular hypofunction: Secondary | ICD-10-CM | POA: Diagnosis not present

## 2012-08-11 ENCOUNTER — Encounter: Payer: Self-pay | Admitting: Cardiovascular Disease

## 2012-08-11 ENCOUNTER — Ambulatory Visit (INDEPENDENT_AMBULATORY_CARE_PROVIDER_SITE_OTHER): Payer: Medicare Other | Admitting: Cardiovascular Disease

## 2012-08-11 VITALS — BP 160/90 | HR 71 | Ht 68.0 in | Wt 181.0 lb

## 2012-08-11 DIAGNOSIS — I2510 Atherosclerotic heart disease of native coronary artery without angina pectoris: Secondary | ICD-10-CM | POA: Diagnosis not present

## 2012-08-11 NOTE — Progress Notes (Signed)
HPI:  Mr. Proia returns for followup evaluation. He is a 67 year old gentleman with coronary artery disease, mild cardiomyopathy, and left bundle branch block. He has chronic exertional dyspnea. Lipids one year ago showed a cholesterol of 124, triglycerides 167, HDL 33, and LDL 57. He was changed from brand-name Crestor to generic atorvastatin.  He complains of chest pain with emotional excitement and anger. This is long-standing. There's been no change in the pattern. He has no exertional chest discomfort. He denies orthopnea, PND, or leg swelling. He's developed a "boil" in his genital area and is seeing Dr. Patsi Sears tomorrow.  Outpatient Encounter Prescriptions as of 08/11/2012  Medication Sig Dispense Refill  . Alum & Mag Hydroxide-Simeth (MAGIC MOUTHWASH) SOLN 1 tsp gargle and swallow up to 4 times daily as needed  120 mL  0  . amoxicillin-clavulanate (AUGMENTIN) 875-125 MG per tablet Take 1 tablet by mouth 2 (two) times daily.  14 tablet  0  . ANDROGEL PUMP 20.25 MG/ACT (1.62%) GEL Apply topically as directed. 4 Pumps      . aspirin (ADULT ASPIRIN EC LOW STRENGTH) 81 MG EC tablet Take 81 mg by mouth daily.        Marland Kitchen atorvastatin (LIPITOR) 20 MG tablet Take 1 tablet (20 mg total) by mouth daily.  90 tablet  3  . b complex vitamins tablet Take 1 tablet by mouth daily.        . carvedilol (COREG) 12.5 MG tablet Take 1 tablet (12.5 mg total) by mouth 2 (two) times daily with a meal.  180 tablet  1  . clopidogrel (PLAVIX) 75 MG tablet Take one tablet daily      . Coenzyme Q10 (CO Q 10) 100 MG CAPS Take 1 capsule by mouth daily.        . fish oil-omega-3 fatty acids 1000 MG capsule Take 2 g by mouth daily.        . folic acid (FOLVITE) 400 MCG tablet Take 400 mcg by mouth daily.        . Glucosamine-Chondroitin 1500-1200 MG/30ML LIQD Take 1 tablet by mouth daily.        . hydrocortisone 2.5 % lotion Apply 1 application topically as needed.       . indomethacin (INDOCIN) 25 MG capsule Take 25  mg by mouth 2 (two) times daily with a meal.       . losartan (COZAAR) 100 MG tablet Take 1 tablet (100 mg total) by mouth daily.  90 tablet  1  . Multiple Vitamin (MULTIVITAMIN) capsule Take 1 capsule by mouth daily.        . niacin (SLO-NIACIN) 500 MG tablet Take 500 mg by mouth daily.        . pantoprazole (PROTONIX) 40 MG tablet One tablet by mouth once daily       . traMADol (ULTRAM) 50 MG tablet Take 50 mg by mouth every 6 (six) hours as needed. One tablet by mouth three times a day       No facility-administered encounter medications on file as of 08/11/2012.    Allergies  Allergen Reactions  . Sulfamethoxazole W-Trimethoprim     REACTION: pt states allergic to SULFA drugs/ Rash  . Sulfonamide Derivatives     REACTION: rash    Past Medical History  Diagnosis Date  . HTN (hypertension)   . CAD (coronary artery disease)   . Hyperlipidemia   . Allergic rhinitis   . OSA (obstructive sleep apnea)   . Hemoptysis   .  Hiatal hernia   . GERD (gastroesophageal reflux disease)   . Esophageal stricture   . 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 History of colonic polyps     hyperplastic  . Hypertrophy of prostate with urinary obstruction and other lower urinary tract symptoms (LUTS)   . Testicular hypofunction   . OA (osteoarthritis)   . Chronic back pain     intermittent  . Dizziness   . Hx of colonoscopy   . CHF (congestive heart failure)     ROS: Negative except as per HPI  BP 160/90  Pulse 71  Ht 5\' 8"  (1.727 m)  Wt 82.101 kg (181 lb)  BMI 27.53 kg/m2  SpO2 97%  PHYSICAL EXAM: Pt is alert and oriented, NAD HEENT: normal Neck: JVP - normal, carotids 2+= without bruits Lungs: CTA bilaterally CV: RRR without murmur or gallop Abd: soft, NT, Positive BS, no hepatomegaly Ext: no C/C/E, distal pulses intact and equal Skin: warm/dry no rash  EKG:  Normal sinus rhythm with  first-degree AV block, left bundle branch block.  ASSESSMENT AND PLAN: 1. Coronary artery disease, native vessel. The patient has undergone remote stenting. His stent has been patent a followup cardiac catheterization. We discussed the risk/benefit for continued Plavix. I think it is perfectly reasonable to stop Plavix at this point. He understands that there is a very small risk of late stent thrombosis, but there also would be an increased risk of bleeding with continuing both aspirin and Plavix. He is comfortable stopping. Otherwise he will continue his same medications.  2. Hypertension. Blood pressure is little bit elevated today. I asked him to begin to monitor this at home. He tells me is well controlled for the most part. He will call back if readings are greater than 140/90.  3. Hyperlipidemia. He is treated with niacin and atorvastatin. Last lipid showed cholesterol of 124, HDL 33, and LDL 57  Will recheck in 6 months.  Tonny Bollman 08/14/2012 10:15 PM

## 2012-08-11 NOTE — Patient Instructions (Addendum)
Stop Plavix.  Your physician wants you to follow-up in: 6 months with Dr. Excell Seltzer.  You will receive a reminder letter in the mail two months in advance. If you don't receive a letter, please call our office to schedule the follow-up appointment.

## 2012-08-12 ENCOUNTER — Telehealth: Payer: Self-pay | Admitting: Pulmonary Disease

## 2012-08-12 ENCOUNTER — Telehealth: Payer: Self-pay | Admitting: Cardiovascular Disease

## 2012-08-12 DIAGNOSIS — E291 Testicular hypofunction: Secondary | ICD-10-CM | POA: Diagnosis not present

## 2012-08-12 DIAGNOSIS — L03319 Cellulitis of trunk, unspecified: Secondary | ICD-10-CM | POA: Diagnosis not present

## 2012-08-12 DIAGNOSIS — L02219 Cutaneous abscess of trunk, unspecified: Secondary | ICD-10-CM | POA: Diagnosis not present

## 2012-08-12 NOTE — Telephone Encounter (Signed)
Spoke with patient-states he is to have drainage surgery of groin abscess on Friday 08-14-12 AM. Pt needs to be cleared for general anesthesia and states Dr Excell Seltzer is out of the office until Friday AM. Would like to know if SN can see him or give surgery clearance as Dr Excell Seltzer seen him 08-11-12 and did EKG,etc. Will send to SN to advise. Thanks.

## 2012-08-12 NOTE — Telephone Encounter (Addendum)
Informed nurse that pt is off plavix as of yesterday. Pt to have I and D of groin abcess, he is scheduled for this Friday 08/14/12 under general anesthesia, please advise if pt has cardiac clearance and address Asprin use.

## 2012-08-12 NOTE — Telephone Encounter (Signed)
He should remain on ASA 81 mg. He is now off of plavix. Otherwise ok to proceed with surgery without further cardiac testing.  Tonny Bollman 08/12/2012 11:35 PM

## 2012-08-12 NOTE — Telephone Encounter (Signed)
Follow-up:    Called in needing to know if the patient would be able to hold his Plavix and Aspirin for an upcomming procedure on 08/14/12.  Please call or fax: [phone].

## 2012-08-12 NOTE — Telephone Encounter (Signed)
New Problem:    Patient called in needing a surgical clearance for anesthesia Friday 08/14/12 to have surgery at Dr. Imelda Pillow office.  Please call back.

## 2012-08-12 NOTE — Telephone Encounter (Signed)
Called and spoke with pt and he stated that [facility] will be doing this sugery on Friday.  Dr. Mena Goes will be the surgeon and the pt will need the surgical clearance sent to them.  SN is aware.

## 2012-08-13 ENCOUNTER — Encounter (HOSPITAL_COMMUNITY)
Admission: RE | Admit: 2012-08-13 | Discharge: 2012-08-13 | Disposition: A | Discharge status: Home / Self Care | Payer: Medicare Other | Source: Ambulatory Visit | Attending: Urology | Admitting: Urology

## 2012-08-13 ENCOUNTER — Ambulatory Visit (HOSPITAL_COMMUNITY)
Admission: RE | Admit: 2012-08-13 | Discharge: 2012-08-13 | Disposition: A | Discharge status: Home / Self Care | Payer: Medicare Other | Source: Ambulatory Visit | Attending: Urology | Admitting: Urology

## 2012-08-13 ENCOUNTER — Encounter (HOSPITAL_COMMUNITY): Payer: Self-pay | Admitting: Pharmacy Technician

## 2012-08-13 ENCOUNTER — Other Ambulatory Visit: Payer: Self-pay | Admitting: Urology

## 2012-08-13 ENCOUNTER — Encounter (HOSPITAL_COMMUNITY): Payer: Self-pay

## 2012-08-13 DIAGNOSIS — Z8679 Personal history of other diseases of the circulatory system: Secondary | ICD-10-CM | POA: Insufficient documentation

## 2012-08-13 DIAGNOSIS — L02219 Cutaneous abscess of trunk, unspecified: Secondary | ICD-10-CM | POA: Diagnosis not present

## 2012-08-13 DIAGNOSIS — K219 Gastro-esophageal reflux disease without esophagitis: Secondary | ICD-10-CM | POA: Diagnosis not present

## 2012-08-13 DIAGNOSIS — Z01812 Encounter for preprocedural laboratory examination: Secondary | ICD-10-CM | POA: Insufficient documentation

## 2012-08-13 DIAGNOSIS — Z7982 Long term (current) use of aspirin: Secondary | ICD-10-CM | POA: Diagnosis not present

## 2012-08-13 DIAGNOSIS — N400 Enlarged prostate without lower urinary tract symptoms: Secondary | ICD-10-CM | POA: Diagnosis not present

## 2012-08-13 DIAGNOSIS — L748 Other eccrine sweat disorders: Secondary | ICD-10-CM | POA: Diagnosis not present

## 2012-08-13 DIAGNOSIS — G4730 Sleep apnea, unspecified: Secondary | ICD-10-CM | POA: Diagnosis not present

## 2012-08-13 DIAGNOSIS — Z79899 Other long term (current) drug therapy: Secondary | ICD-10-CM | POA: Diagnosis not present

## 2012-08-13 DIAGNOSIS — M129 Arthropathy, unspecified: Secondary | ICD-10-CM | POA: Diagnosis not present

## 2012-08-13 DIAGNOSIS — L02239 Carbuncle of trunk, unspecified: Secondary | ICD-10-CM | POA: Insufficient documentation

## 2012-08-13 DIAGNOSIS — Z7902 Long term (current) use of antithrombotics/antiplatelets: Secondary | ICD-10-CM | POA: Diagnosis not present

## 2012-08-13 DIAGNOSIS — Z9861 Coronary angioplasty status: Secondary | ICD-10-CM | POA: Diagnosis not present

## 2012-08-13 DIAGNOSIS — Z4789 Encounter for other orthopedic aftercare: Secondary | ICD-10-CM | POA: Diagnosis not present

## 2012-08-13 DIAGNOSIS — I2510 Atherosclerotic heart disease of native coronary artery without angina pectoris: Secondary | ICD-10-CM | POA: Diagnosis not present

## 2012-08-13 DIAGNOSIS — I509 Heart failure, unspecified: Secondary | ICD-10-CM | POA: Diagnosis not present

## 2012-08-13 DIAGNOSIS — I4891 Unspecified atrial fibrillation: Secondary | ICD-10-CM | POA: Diagnosis not present

## 2012-08-13 DIAGNOSIS — I10 Essential (primary) hypertension: Secondary | ICD-10-CM | POA: Diagnosis not present

## 2012-08-13 DIAGNOSIS — L02229 Furuncle of trunk, unspecified: Secondary | ICD-10-CM | POA: Insufficient documentation

## 2012-08-13 DIAGNOSIS — Z85828 Personal history of other malignant neoplasm of skin: Secondary | ICD-10-CM | POA: Diagnosis not present

## 2012-08-13 DIAGNOSIS — Z87891 Personal history of nicotine dependence: Secondary | ICD-10-CM | POA: Diagnosis not present

## 2012-08-13 DIAGNOSIS — E291 Testicular hypofunction: Secondary | ICD-10-CM | POA: Diagnosis not present

## 2012-08-13 HISTORY — DX: Shortness of breath: R06.02

## 2012-08-13 HISTORY — DX: Cardiac arrhythmia, unspecified: I49.9

## 2012-08-13 LAB — CBC
HCT: 37.5 % — ABNORMAL LOW (ref 39.0–52.0)
MCHC: 34.4 g/dL (ref 30.0–36.0)
MCV: 88.4 fL (ref 78.0–100.0)
Platelets: 259 10*3/uL (ref 150–400)
RDW: 12.7 % (ref 11.5–15.5)
WBC: 5.9 10*3/uL (ref 4.0–10.5)

## 2012-08-13 LAB — BASIC METABOLIC PANEL
BUN: 14 mg/dL (ref 6–23)
CO2: 26 mEq/L (ref 19–32)
Calcium: 9.8 mg/dL (ref 8.4–10.5)
Chloride: 102 mEq/L (ref 96–112)
Creatinine, Ser: 0.81 mg/dL (ref 0.50–1.35)
GFR calc Af Amer: >90 mL/min (ref >90)

## 2012-08-13 LAB — SURGICAL PCR SCREEN
MRSA, PCR: NEGATIVE
Staphylococcus aureus: NEGATIVE

## 2012-08-13 NOTE — Telephone Encounter (Signed)
Informed pt and Dr Imelda Pillow staff/ [facility].

## 2012-08-13 NOTE — Patient Instructions (Signed)
[name]  08/13/2012   Your procedure is scheduled on:  08/14/12   Report to [facility] at    0845 AM.  Call this number if you have problems the morning of surgery: [phone]   Remember:   Do not eat food or drink liquids after midnight.   Take these medicines the morning of surgery with A SIP OF WATER:    Do not wear jewelry,   Do not wear lotions, powders, or perfumes. .  . Men may shave face and neck.  Do not bring valuables to the hospital.  Contacts, dentures or bridgework may not be worn into surgery.      Patients discharged the day of surgery will not be allowed to drive  home.  Name and phone number of your driver:    SEE [facility]G INSTRUCTION SHEET    Please read over the following fact sheets that you were given: MRSA Information, coughing and deep breathing exercises, leg exercises               Failure to comply with these instructions may result in cancellation of your surgery.                Patient Signature ____________________________              Nurse Signature _____________________________

## 2012-08-13 NOTE — Progress Notes (Signed)
Last office visit with Dr Excell Seltzer( cardiologist) 08/11/12 EPIC  Cardiac MRI 08/14/11  ECHO 07/22/11  EKG 08/11/12 [facility]

## 2012-08-14 ENCOUNTER — Ambulatory Visit (HOSPITAL_COMMUNITY)
Admission: RE | Admit: 2012-08-14 | Discharge: 2012-08-14 | Disposition: A | Discharge status: Home / Self Care | Payer: Medicare Other | Source: Ambulatory Visit | Attending: Urology | Admitting: Urology

## 2012-08-14 ENCOUNTER — Encounter (HOSPITAL_COMMUNITY): Payer: Self-pay | Admitting: Anesthesiology

## 2012-08-14 ENCOUNTER — Encounter (HOSPITAL_COMMUNITY): Payer: Self-pay | Admitting: *Deleted

## 2012-08-14 ENCOUNTER — Encounter: Payer: Self-pay | Admitting: Cardiovascular Disease

## 2012-08-14 ENCOUNTER — Encounter (HOSPITAL_COMMUNITY)
Admission: RE | Disposition: A | Discharge status: Home / Self Care | Payer: Self-pay | Source: Ambulatory Visit | Attending: Urology

## 2012-08-14 ENCOUNTER — Ambulatory Visit (HOSPITAL_COMMUNITY): Payer: Medicare Other | Admitting: Anesthesiology

## 2012-08-14 DIAGNOSIS — M129 Arthropathy, unspecified: Secondary | ICD-10-CM | POA: Insufficient documentation

## 2012-08-14 DIAGNOSIS — K219 Gastro-esophageal reflux disease without esophagitis: Secondary | ICD-10-CM | POA: Insufficient documentation

## 2012-08-14 DIAGNOSIS — Z79899 Other long term (current) drug therapy: Secondary | ICD-10-CM | POA: Insufficient documentation

## 2012-08-14 DIAGNOSIS — E291 Testicular hypofunction: Secondary | ICD-10-CM | POA: Diagnosis not present

## 2012-08-14 DIAGNOSIS — L748 Other eccrine sweat disorders: Secondary | ICD-10-CM | POA: Insufficient documentation

## 2012-08-14 DIAGNOSIS — I509 Heart failure, unspecified: Secondary | ICD-10-CM | POA: Insufficient documentation

## 2012-08-14 DIAGNOSIS — Z85828 Personal history of other malignant neoplasm of skin: Secondary | ICD-10-CM | POA: Insufficient documentation

## 2012-08-14 DIAGNOSIS — G4730 Sleep apnea, unspecified: Secondary | ICD-10-CM | POA: Insufficient documentation

## 2012-08-14 DIAGNOSIS — N400 Enlarged prostate without lower urinary tract symptoms: Secondary | ICD-10-CM | POA: Diagnosis not present

## 2012-08-14 DIAGNOSIS — Z7982 Long term (current) use of aspirin: Secondary | ICD-10-CM | POA: Insufficient documentation

## 2012-08-14 DIAGNOSIS — Z87891 Personal history of nicotine dependence: Secondary | ICD-10-CM | POA: Insufficient documentation

## 2012-08-14 DIAGNOSIS — Z9861 Coronary angioplasty status: Secondary | ICD-10-CM | POA: Insufficient documentation

## 2012-08-14 DIAGNOSIS — Z7902 Long term (current) use of antithrombotics/antiplatelets: Secondary | ICD-10-CM | POA: Insufficient documentation

## 2012-08-14 DIAGNOSIS — M549 Dorsalgia, unspecified: Secondary | ICD-10-CM | POA: Diagnosis not present

## 2012-08-14 DIAGNOSIS — I10 Essential (primary) hypertension: Secondary | ICD-10-CM | POA: Diagnosis not present

## 2012-08-14 DIAGNOSIS — I4891 Unspecified atrial fibrillation: Secondary | ICD-10-CM | POA: Insufficient documentation

## 2012-08-14 DIAGNOSIS — L02219 Cutaneous abscess of trunk, unspecified: Secondary | ICD-10-CM | POA: Diagnosis not present

## 2012-08-14 DIAGNOSIS — I2510 Atherosclerotic heart disease of native coronary artery without angina pectoris: Secondary | ICD-10-CM | POA: Insufficient documentation

## 2012-08-14 DIAGNOSIS — L02229 Furuncle of trunk, unspecified: Secondary | ICD-10-CM | POA: Diagnosis not present

## 2012-08-14 DIAGNOSIS — L02214 Cutaneous abscess of groin: Secondary | ICD-10-CM

## 2012-08-14 DIAGNOSIS — L03319 Cellulitis of trunk, unspecified: Secondary | ICD-10-CM | POA: Diagnosis not present

## 2012-08-14 HISTORY — PX: IRRIGATION AND DEBRIDEMENT ABSCESS: SHX5252

## 2012-08-14 SURGERY — IRRIGATION AND DEBRIDEMENT ABSCESS
Anesthesia: General | Site: Groin | Laterality: Left | Wound class: Dirty or Infected

## 2012-08-14 MED ORDER — KETOROLAC TROMETHAMINE 30 MG/ML IJ SOLN
INTRAMUSCULAR | Status: DC | PRN
  Administered 2012-08-14: 30 mg via INTRAVENOUS

## 2012-08-14 MED ORDER — CEPHALEXIN 500 MG PO CAPS: 500.0000 mg | ORAL_CAPSULE | Freq: Two times a day (BID) | ORAL | Status: DC

## 2012-08-14 MED ORDER — ACETAMINOPHEN 10 MG/ML IV SOLN
INTRAVENOUS | Status: AC
  Filled 2012-08-14: qty 100

## 2012-08-14 MED ORDER — ACETAMINOPHEN 10 MG/ML IV SOLN
INTRAVENOUS | Status: DC | PRN
  Administered 2012-08-14: 1000 mg via INTRAVENOUS

## 2012-08-14 MED ORDER — LACTATED RINGERS IV SOLN: INTRAVENOUS | Status: DC

## 2012-08-14 MED ORDER — CEFAZOLIN SODIUM-DEXTROSE 2-3 GM-% IV SOLR
2.0000 g | INTRAVENOUS | Status: AC
  Administered 2012-08-14: 2 g via INTRAVENOUS

## 2012-08-14 MED ORDER — FENTANYL CITRATE 0.05 MG/ML IJ SOLN
25.0000 ug | INTRAMUSCULAR | Status: DC | PRN
  Administered 2012-08-14: 50 ug via INTRAVENOUS
  Administered 2012-08-14 (×2): 25 ug via INTRAVENOUS

## 2012-08-14 MED ORDER — LIDOCAINE HCL (PF) 2 % IJ SOLN
INTRAMUSCULAR | Status: DC | PRN
  Administered 2012-08-14: 100 mg

## 2012-08-14 MED ORDER — CEFAZOLIN SODIUM-DEXTROSE 2-3 GM-% IV SOLR
INTRAVENOUS | Status: AC
  Filled 2012-08-14: qty 50

## 2012-08-14 MED ORDER — PROPOFOL 10 MG/ML IV BOLUS
INTRAVENOUS | Status: DC | PRN
  Administered 2012-08-14: 100 mg via INTRAVENOUS
  Administered 2012-08-14: 200 mg via INTRAVENOUS

## 2012-08-14 MED ORDER — ONDANSETRON HCL 4 MG/2ML IJ SOLN
INTRAMUSCULAR | Status: DC | PRN
  Administered 2012-08-14: 4 mg via INTRAVENOUS

## 2012-08-14 MED ORDER — BUPIVACAINE-EPINEPHRINE PF 0.25-1:200000 % IJ SOLN
INTRAMUSCULAR | Status: AC
  Filled 2012-08-14: qty 30

## 2012-08-14 MED ORDER — FENTANYL CITRATE 0.05 MG/ML IJ SOLN
INTRAMUSCULAR | Status: DC | PRN
  Administered 2012-08-14 (×2): 50 ug via INTRAVENOUS

## 2012-08-14 MED ORDER — FENTANYL CITRATE 0.05 MG/ML IJ SOLN
INTRAMUSCULAR | Status: AC
  Filled 2012-08-14: qty 2

## 2012-08-14 MED ORDER — LIDOCAINE-EPINEPHRINE 1 %-1:100000 IJ SOLN
INTRAMUSCULAR | Status: AC
  Filled 2012-08-14: qty 1

## 2012-08-14 MED ORDER — LIDOCAINE HCL 1 % IJ SOLN
INTRAMUSCULAR | Status: AC
  Filled 2012-08-14: qty 20

## 2012-08-14 MED ORDER — OXYCODONE-ACETAMINOPHEN 5-325 MG PO TABS: 1.0000 | ORAL_TABLET | ORAL | Status: DC | PRN

## 2012-08-14 MED ORDER — LACTATED RINGERS IV SOLN
INTRAVENOUS | Status: DC | PRN
  Administered 2012-08-14: 10:00:00 via INTRAVENOUS

## 2012-08-14 MED ORDER — OXYCODONE-ACETAMINOPHEN 5-325 MG PO TABS
1.0000 | ORAL_TABLET | ORAL | Status: DC | PRN
  Administered 2012-08-14: 1 via ORAL
  Filled 2012-08-14: qty 1

## 2012-08-14 MED ORDER — POVIDONE-IODINE 7.5 % EX SOLN: CUTANEOUS | Status: DC | PRN

## 2012-08-14 MED ORDER — BUPIVACAINE HCL (PF) 0.25 % IJ SOLN
INTRAMUSCULAR | Status: AC
  Filled 2012-08-14: qty 30

## 2012-08-14 SURGICAL SUPPLY — 47 items
APPLICATOR COTTON TIP 6IN STRL (MISCELLANEOUS) IMPLANT
BAG DECANTER FOR FLEXI CONT (MISCELLANEOUS) ×1 IMPLANT
BAG URINE DRAINAGE (UROLOGICAL SUPPLIES) ×1 IMPLANT
BLADE HEX COATED 2.75 (ELECTRODE) ×2 IMPLANT
BLADE SURG 15 STRL LF DISP TIS (BLADE) ×2 IMPLANT
BLADE SURG 15 STRL SS (BLADE) ×2
BRIEF STRETCH FOR OB PAD LRG (UNDERPADS AND DIAPERS) ×2 IMPLANT
CANISTER SUCTION 1200CC (MISCELLANEOUS) IMPLANT
CANISTER SUCTION 2500CC (MISCELLANEOUS) ×1 IMPLANT
CATH FOLEY 2WAY SLVR  5CC 16FR (CATHETERS)
CATH FOLEY 2WAY SLVR 5CC 16FR (CATHETERS) ×1 IMPLANT
CLOTH BEACON ORANGE TIMEOUT ST (SAFETY) ×2 IMPLANT
DISSECTOR ROUND CHERRY 3/8 STR (MISCELLANEOUS) IMPLANT
DRAPE LAPAROTOMY TRNSV 102X78 (DRAPE) ×2 IMPLANT
DRAPE TABLE BACK 44X90 PK DISP (DRAPES) ×1 IMPLANT
DRSG TEGADERM 4X4.75 (GAUZE/BANDAGES/DRESSINGS) ×1 IMPLANT
ELECT REM PT RETURN 9FT ADLT (ELECTROSURGICAL) ×2
ELECTRODE REM PT RTRN 9FT ADLT (ELECTROSURGICAL) ×1 IMPLANT
GAUZE PACKING IODOFORM 1/2 (PACKING) ×1 IMPLANT
GLOVE BIO SURGEON STRL SZ7.5 (GLOVE) ×4 IMPLANT
GOWN PREVENTION PLUS LG XLONG (DISPOSABLE) ×2 IMPLANT
GOWN STRL REIN XL XLG (GOWN DISPOSABLE) ×2 IMPLANT
HOOK RETRACTION 12 ELAST STAY (MISCELLANEOUS) IMPLANT
NS IRRIG 1000ML POUR BTL (IV SOLUTION) ×2 IMPLANT
PACK BASIN DAY SURGERY FS (CUSTOM PROCEDURE TRAY) ×2 IMPLANT
PACK GENERAL/GYN (CUSTOM PROCEDURE TRAY) ×2 IMPLANT
PAD PREP 24X48 CUFFED NSTRL (MISCELLANEOUS) ×2 IMPLANT
PENCIL BUTTON HOLSTER BLD 10FT (ELECTRODE) ×2 IMPLANT
PLUG CATH AND CAP STER (CATHETERS) ×1 IMPLANT
SPONGE GAUZE 4X4 12PLY (GAUZE/BANDAGES/DRESSINGS) ×1 IMPLANT
SPONGE LAP 4X18 X RAY DECT (DISPOSABLE) ×2 IMPLANT
STRIP CLOSURE SKIN 1/2X4 (GAUZE/BANDAGES/DRESSINGS) IMPLANT
SURGILUBE 2OZ TUBE FLIPTOP (MISCELLANEOUS) ×1 IMPLANT
SUT VIC AB 2-0 UR6 27 (SUTURE) ×6 IMPLANT
SUT VIC AB 3-0 54XBRD REEL (SUTURE) ×1 IMPLANT
SUT VIC AB 3-0 BRD 54 (SUTURE)
SUT VIC AB 3-0 SH 27 (SUTURE)
SUT VIC AB 3-0 SH 27X BRD (SUTURE) IMPLANT
SYR 20CC LL (SYRINGE) ×4 IMPLANT
SYR 50ML LL SCALE MARK (SYRINGE) ×4 IMPLANT
SYR 5ML LL (SYRINGE) ×2 IMPLANT
SYR BULB IRRIGATION 50ML (SYRINGE) ×2 IMPLANT
SYR CONTROL 10ML LL (SYRINGE) ×2 IMPLANT
TOWEL OR 17X26 10 PK STRL BLUE (TOWEL DISPOSABLE) ×4 IMPLANT
TOWEL OR NON WOVEN STRL DISP B (DISPOSABLE) ×2 IMPLANT
WATER STERILE IRR 500ML POUR (IV SOLUTION) ×1 IMPLANT
YANKAUER SUCT BULB TIP NO VENT (SUCTIONS) ×2 IMPLANT

## 2012-08-14 NOTE — Op Note (Signed)
Pre-operative diagnosis :   Left groin abscess  Postoperative diagnosis::  Same(infected sweat gland)  Operation:  Incision and drainage of infected sweat gland  Surgeon:  S. Patsi Sears, MD  First assistant:  None  Anesthesia:  general  Preparation:  After appropriate preanesthesia, the patient was brought to the operative room, placed in the upper table in the dorsal supine position where general LMA anesthesia was introduced. He remained in this position, where the inguinal area was prepped with Betadine solution and draped in the usual fashion. Armband was double checked.  Review history:  [name] has presented today for surgery, with the diagnosis of left inguinal boil The various methods of treatment have been discussed with the patient and family. After consideration of risks, benefits and other options for treatment, the patient has consented to Procedure(s):  IRRIGATION AND DEBRIDEMENT LEFT INGUINAL BOIL (Left) as a surgical intervention . The patient's history has been reviewed, patient examined, no change in status, stable for surgery. I have reviewed the patient's chart and labs. Questions were answered to the patient's satisfaction.    Statement of  Likelihood of Success: Excellent. TIME-OUT observed.:  Procedure:   Inspection revealed an erythematous boil in the groin. The patient had previously picked the groin and drained pus. However, there was more pus drained, and this is cultured with both aerobic and anaerobic cultures. Horizontal incision is made through the abscess, and cheesy white material was drained. Necrotic debris is excised from the skin and the wound. The wound is debrided with a wet sponge. Following this, no bleeding is noted, and the wound is packed with iodoform gauze. Sterile dressings applied. The patient received 2 g of IV Ancef. He had previously been treated with by mouth Keflex. He was given IV Tylenol, and IV Toradol. He was awakened, and taken to recovery  room in good condition.

## 2012-08-14 NOTE — Progress Notes (Signed)
Pt's ex-wife(Joan) is unable to wound packing d/t intolerance. She got very lightheaded when looking at the wound and nauseated when giving wound care instructions.  Aurea Graff states she will not be able to change the dressing and pt states he isnt sure how he will be able to manage the wound packing.  I have paged Dr. Patsi Sears, spoke with Hassel Neth CMA in his office to arrange [facility].  Awaiting orders/plan for [facility] arrangement.

## 2012-08-14 NOTE — Anesthesia Preprocedure Evaluation (Addendum)
Anesthesia Evaluation  Patient identified by MRN, date of birth, ID band Patient awake    Reviewed: Allergy & Precautions, H&P , NPO status , Patient's Chart, lab work & pertinent test results, reviewed documented beta blocker date and time   Airway Mallampati: III TM Distance: >3 FB Neck ROM: full    Dental no notable dental hx. (+) Teeth Intact and Dental Advisory Given   Pulmonary neg pulmonary ROS, shortness of breath and with exertion, sleep apnea and Continuous Positive Airway Pressure Ventilation ,  breath sounds clear to auscultation  Pulmonary exam normal       Cardiovascular Exercise Tolerance: Good hypertension, Pt. on medications + CAD, + Cardiac Stents and +CHF negative cardio ROS  Rhythm:regular Rate:Normal  Stent 2008.  RBBB   Neuro/Psych negative neurological ROS  negative psych ROS   GI/Hepatic negative GI ROS, Neg liver ROS, hiatal hernia, GERD-  Medicated and Controlled,  Endo/Other  negative endocrine ROS  Renal/GU negative Renal ROS  negative genitourinary   Musculoskeletal   Abdominal   Peds  Hematology negative hematology ROS (+)   Anesthesia Other Findings   Reproductive/Obstetrics negative OB ROS                          Anesthesia Physical Anesthesia Plan  ASA: III  Anesthesia Plan: General   Post-op Pain Management:    Induction: Intravenous  Airway Management Planned: LMA  Additional Equipment:   Intra-op Plan:   Post-operative Plan:   Informed Consent: I have reviewed the patients History and Physical, chart, labs and discussed the procedure including the risks, benefits and alternatives for the proposed anesthesia with the patient or authorized representative who has indicated his/her understanding and acceptance.   Dental Advisory Given  Plan Discussed with: CRNA and Surgeon  Anesthesia Plan Comments:         Anesthesia Quick  Evaluation

## 2012-08-14 NOTE — Progress Notes (Signed)
Old dressing removed from Left inguinal wound.  Wound repacked with iodiforme.and 4x4 gauze placed over packing then mesh underwear put back on.  Wound was red in color with minimal seriousanguiness drainage.  Pt tolerated well.

## 2012-08-14 NOTE — Telephone Encounter (Signed)
SN is aware of this and has faxed the surgical clearance to urology.

## 2012-08-14 NOTE — Interval H&P Note (Signed)
History and Physical Interval Note:  08/14/2012 11:25 AM  [name]  has presented today for surgery, with the diagnosis of left inguinal boil  The various methods of treatment have been discussed with the patient and family. After consideration of risks, benefits and other options for treatment, the patient has consented to  Procedure(s): IRRIGATION AND DEBRIDEMENT LEFT INGUINAL BOIL  (Left) as a surgical intervention .  The patient's history has been reviewed, patient examined, no change in status, stable for surgery.  I have reviewed the patient's chart and labs.  Questions were answered to the patient's satisfaction.     [name]

## 2012-08-14 NOTE — Transfer of Care (Signed)
Immediate Anesthesia Transfer of Care Note  Patient: [name]  Procedure(s) Performed: Procedure(s): IRRIGATION AND DEBRIDEMENT LEFT INGUINAL BOIL  (Left)  Patient Location: PACU  Anesthesia Type:General  Level of Consciousness: awake and patient cooperative  Airway & Oxygen Therapy: Patient Spontanous Breathing and Patient connected to face mask oxygen  Post-op Assessment: Report given to PACU RN and Post -op Vital signs reviewed and stable  Post vital signs: Reviewed and stable  Complications: No apparent anesthesia complications

## 2012-08-14 NOTE — Progress Notes (Signed)
Pt to have [facility] for his would care. Pt provided with phone number to [facility].

## 2012-08-14 NOTE — Preoperative (Signed)
Beta Blockers   Reason not to administer Beta Blockers:Took Coreg this am. 

## 2012-08-14 NOTE — H&P (Signed)
ef Complaint    cc: Dr. Excell Seltzer cc: Dr. Alroy Dust   Reason For Visit  4 mo f/u   Active Problems Problems  1. Benign Prostatic Hypertrophy Without Urinary Obstruction 600.00 2. 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  Also has a hx of 3 year "lump"  at the base of the penis.   He continues to have low libido, and poor energy. T level still low at 216.      He is off Plavix ( yesterday) , ASA, and Celebrex after cardiac stents.  Pt wants his T level checked, because he is 63, with low energy, loss of libido, tired all the time. He has co-incidental CHF. IPSS = 6/6. Previous psa 1.33 and Vit D=47.3.  T=574.79 on 04/30/10.  04/29/11 labs:  PSA - 2.19/37% and Testosterone - 272.77 12/04/09  PSA - 1.70/22.4%   Past Medical History Problems  1. History of  Arthritis V13.4 2. History of  Atrial Fibrillation 427.31 3. History of  Cardiac Failure 428.9 4. History of  Cath Stent Placement Right 5. History of  Heart Disease 429.9 6. History of  Heartburn 787.1 7. History of  Hypercholesterolemia 272.0 8. History of  Hypertension 401.9 9. History of  Skin Cancer V10.83 10. History of  Sleep Apnea 780.57  Surgical History Problems  1. History of  Ear Surgery 2. History of  Sinus Surgery 3. History of  Upper Gastrointestinal Endoscopy (Therapeutic)  Current Meds 1. Aspirin 81 MG Oral Tablet; Therapy: (Recorded:16Jul2008) to 2. Atorvastatin Calcium 20 MG Oral Tablet; Therapy: 03Sep2013 to 3. Carac 0.5 % External Cream; APPLY INCH  PRN; Therapy: 22Nov2010 to (Evaluate:17Feb2011) 4. Carvedilol 12.5 MG Oral Tablet; Therapy: (Recorded:15Jun2010) to 5. Co Q-10 CAPS; Therapy: (Recorded:16Jul2008) to 6. Crestor TABS; 10mg ; Therapy: (Recorded:16Jul2008) to 7. Etodolac 400 MG Oral Tablet; TAKE TABLET  PRN; Therapy: 20Aug2012 to (Evaluate:18Nov2012) 8. Fish Oil  1000 MG Oral Capsule; Therapy: (Recorded:15Jan2013) to 9. Flax Seed Oil CAPS; Therapy: (Recorded:15Jan2013) to 10. Folic Acid CAPS; Therapy: (Recorded:16Jul2008) to 11. Glucosamine Chondro Complex TABS; Therapy: (Recorded:16Jul2008) to 12. Hydrocodone-Acetaminophen 5-500 MG Oral Tablet; Therapy: 22Jun2012 to 13. Hydrocortisone 2.5 % External Lotion; Therapy: 30Jul2013 to 14. Indomethacin 25 MG Oral Capsule; Therapy: 17Apr2013 to 15. Losartan Potassium 100 MG Oral Tablet; Therapy: 20Aug2012 to 16. Multi-Vitamin TABS; Therapy: (Recorded:16Jul2008) to 17. Niacin 500 MG Oral Tablet; Therapy: (Recorded:15Jun2010) to 18. Pantoprazole Sodium 40 MG Oral Tablet Delayed Release; Therapy: 30Oct2012 to 19. Plavix 75 MG Oral Tablet; Therapy: (Recorded:16Jul2008) to 20. Super B Complex TABS; Therapy: (Recorded:15Jan2013) to 21. Testosterone Cream 5%; Apply 2 ml daily (10 grams); Therapy: 11Dec2013 to   (Evaluate:09Jun2014); Last Rx:11Dec2013 22. TraMADol HCl 50 MG Oral Tablet; Therapy: 30Oct2012 to 23. Vitamin D3 CAPS; Therapy: (Recorded:15Jun2010) to  Allergies Medication  1. Sulfa Drugs  Family History Problems  1. Paternal history of  Acute Myocardial Infarction V17.3 2. Maternal history of  Family Health Status Number Of Children one daughter 3. Paternal history of  Heart Disease V17.49 4. Maternal history of  Heart Disease V17.49  Social History Problems  1. Activities Of Daily Living 2. Caffeine Use 3-4 a day 3. Family history of  Death In The Family Father 6yrs, heart complications 4. Family history of  Death In The Family Mother 44yrs, complications from pnumonia 5. Exercise Habits Prior to 2  months ago he was working out at [employer] 4-5 times a week U. doing cardio in light resistive training. He is hoping to resume his active lifestyle in the very near future. 6. Living Independently With Spouse 7. Marital History - Currently Married 8. Occupation: Systems developer 9.  Self-reliant In Usual Daily Activities 10. Tobacco Use V15.82 1 pk a day for 40yrs, non smoker for 65yrs. Denied  11. History of  Alcohol Use  Review of Systems Genitourinary, constitutional, skin, eye, otolaryngeal, hematologic/lymphatic, cardiovascular, pulmonary, endocrine, musculoskeletal, gastrointestinal, neurological and psychiatric system(s) were reviewed and pertinent findings if present are noted.    Vitals Vital Signs [Data Includes: Last 1 Day]  26Mar2014 03:35PM  Blood Pressure: 164 / 72 Temperature: 98.6 F Heart Rate: 79  Assessment Assessed  1. Skin Abscess Of The Groin 682.2 2. 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  Left inguinal boil-spontaneous drainage after he squeezed it this weekend.  2. Dr.Eskridge to open Friday AM. Place on Keflex today.  3. Pt will double double his T and will use 2cc each day instead of 1. He will RTC 4 months for both T and estrogen tests.   Plan Hypogonadism (257.2)  1. Testosterone Cream 5%; Apply 2 ml daily (10 grams); Therapy: 11Dec2013 to  (Evaluate:22Sep2014); Last Rx:26Mar2014 2. Follow-up Month x 4 Office  Follow-up  Requested for: 26Mar2014 3. ESTRADIOL  Requested for: 26Mar2014 4. TESTOSTERONE  Requested for: 26Mar2014 Skin Abscess Of The Groin (682.2)  5. Cephalexin 500 MG Oral Capsule; TAKE 1 CAPSULE EVERY 6 HOURS DAILY; Therapy:  26Mar2014 to (Evaluate:31Mar2014)  Requested for: 26Mar2014; Last Rx:26Mar2014; Edited    1. Keflex for draining boil of the L groin.  2. I/ abscess Friday AM Dr. Mena Goes: pt to get note from Dr. Excell Seltzer tomorrow re: cardiology clearance for anesthesia.  3. Double his T dose, and then test both T and estradiol (? not absorbing or breaking down too fast).   Signatures Electronically signed by : Jethro Bolus, M.D.; Aug 12 2012  4:04PM

## 2012-08-14 NOTE — Anesthesia Postprocedure Evaluation (Signed)
  Anesthesia Post-op Note  Patient: [name]  Procedure(s) Performed: Procedure(s) (LRB): IRRIGATION AND DEBRIDEMENT LEFT INGUINAL BOIL  (Left)  Patient Location: PACU  Anesthesia Type: General  Level of Consciousness: awake and alert   Airway and Oxygen Therapy: Patient Spontanous Breathing  Post-op Pain: mild  Post-op Assessment: Post-op Vital signs reviewed, Patient's Cardiovascular Status Stable, Respiratory Function Stable, Patent Airway and No signs of Nausea or vomiting  Last Vitals:  Filed Vitals:   08/14/12 1245  BP: 148/76  Pulse: 68  Temp:   Resp: 14    Post-op Vital Signs: stable   Complications: No apparent anesthesia complications

## 2012-08-15 DIAGNOSIS — E785 Hyperlipidemia, unspecified: Secondary | ICD-10-CM | POA: Diagnosis not present

## 2012-08-15 DIAGNOSIS — L03319 Cellulitis of trunk, unspecified: Secondary | ICD-10-CM | POA: Diagnosis not present

## 2012-08-15 DIAGNOSIS — I509 Heart failure, unspecified: Secondary | ICD-10-CM | POA: Diagnosis not present

## 2012-08-15 DIAGNOSIS — I10 Essential (primary) hypertension: Secondary | ICD-10-CM | POA: Diagnosis not present

## 2012-08-15 DIAGNOSIS — L02219 Cutaneous abscess of trunk, unspecified: Secondary | ICD-10-CM | POA: Diagnosis not present

## 2012-08-15 DIAGNOSIS — Z4800 Encounter for change or removal of nonsurgical wound dressing: Secondary | ICD-10-CM | POA: Diagnosis not present

## 2012-08-16 DIAGNOSIS — Z4800 Encounter for change or removal of nonsurgical wound dressing: Secondary | ICD-10-CM | POA: Diagnosis not present

## 2012-08-16 DIAGNOSIS — I10 Essential (primary) hypertension: Secondary | ICD-10-CM | POA: Diagnosis not present

## 2012-08-16 DIAGNOSIS — L02219 Cutaneous abscess of trunk, unspecified: Secondary | ICD-10-CM | POA: Diagnosis not present

## 2012-08-16 DIAGNOSIS — E785 Hyperlipidemia, unspecified: Secondary | ICD-10-CM | POA: Diagnosis not present

## 2012-08-16 DIAGNOSIS — I509 Heart failure, unspecified: Secondary | ICD-10-CM | POA: Diagnosis not present

## 2012-08-17 ENCOUNTER — Encounter (HOSPITAL_COMMUNITY): Payer: Self-pay | Admitting: Urology

## 2012-08-17 DIAGNOSIS — L02219 Cutaneous abscess of trunk, unspecified: Secondary | ICD-10-CM | POA: Diagnosis not present

## 2012-08-17 DIAGNOSIS — L03319 Cellulitis of trunk, unspecified: Secondary | ICD-10-CM | POA: Diagnosis not present

## 2012-08-17 DIAGNOSIS — I10 Essential (primary) hypertension: Secondary | ICD-10-CM | POA: Diagnosis not present

## 2012-08-17 DIAGNOSIS — E785 Hyperlipidemia, unspecified: Secondary | ICD-10-CM | POA: Diagnosis not present

## 2012-08-17 DIAGNOSIS — Z4800 Encounter for change or removal of nonsurgical wound dressing: Secondary | ICD-10-CM | POA: Diagnosis not present

## 2012-08-17 DIAGNOSIS — I509 Heart failure, unspecified: Secondary | ICD-10-CM | POA: Diagnosis not present

## 2012-08-17 LAB — CULTURE, ROUTINE-ABSCESS

## 2012-08-18 DIAGNOSIS — L03319 Cellulitis of trunk, unspecified: Secondary | ICD-10-CM | POA: Diagnosis not present

## 2012-08-18 DIAGNOSIS — I10 Essential (primary) hypertension: Secondary | ICD-10-CM | POA: Diagnosis not present

## 2012-08-18 DIAGNOSIS — I509 Heart failure, unspecified: Secondary | ICD-10-CM | POA: Diagnosis not present

## 2012-08-18 DIAGNOSIS — E785 Hyperlipidemia, unspecified: Secondary | ICD-10-CM | POA: Diagnosis not present

## 2012-08-18 DIAGNOSIS — L02219 Cutaneous abscess of trunk, unspecified: Secondary | ICD-10-CM | POA: Diagnosis not present

## 2012-08-18 DIAGNOSIS — Z4800 Encounter for change or removal of nonsurgical wound dressing: Secondary | ICD-10-CM | POA: Diagnosis not present

## 2012-08-19 DIAGNOSIS — E785 Hyperlipidemia, unspecified: Secondary | ICD-10-CM | POA: Diagnosis not present

## 2012-08-19 DIAGNOSIS — I10 Essential (primary) hypertension: Secondary | ICD-10-CM | POA: Diagnosis not present

## 2012-08-19 DIAGNOSIS — Z4800 Encounter for change or removal of nonsurgical wound dressing: Secondary | ICD-10-CM | POA: Diagnosis not present

## 2012-08-19 DIAGNOSIS — I509 Heart failure, unspecified: Secondary | ICD-10-CM | POA: Diagnosis not present

## 2012-08-19 DIAGNOSIS — L02219 Cutaneous abscess of trunk, unspecified: Secondary | ICD-10-CM | POA: Diagnosis not present

## 2012-08-20 DIAGNOSIS — Z4800 Encounter for change or removal of nonsurgical wound dressing: Secondary | ICD-10-CM | POA: Diagnosis not present

## 2012-08-20 DIAGNOSIS — E785 Hyperlipidemia, unspecified: Secondary | ICD-10-CM | POA: Diagnosis not present

## 2012-08-20 DIAGNOSIS — L02219 Cutaneous abscess of trunk, unspecified: Secondary | ICD-10-CM | POA: Diagnosis not present

## 2012-08-20 DIAGNOSIS — I509 Heart failure, unspecified: Secondary | ICD-10-CM | POA: Diagnosis not present

## 2012-08-20 DIAGNOSIS — I10 Essential (primary) hypertension: Secondary | ICD-10-CM | POA: Diagnosis not present

## 2012-08-21 LAB — ANAEROBIC CULTURE

## 2012-09-02 DIAGNOSIS — M19079 Primary osteoarthritis, unspecified ankle and foot: Secondary | ICD-10-CM | POA: Diagnosis not present

## 2012-09-02 DIAGNOSIS — M25549 Pain in joints of unspecified hand: Secondary | ICD-10-CM | POA: Diagnosis not present

## 2012-09-02 DIAGNOSIS — M1710 Unilateral primary osteoarthritis, unspecified knee: Secondary | ICD-10-CM | POA: Diagnosis not present

## 2012-09-02 DIAGNOSIS — M19049 Primary osteoarthritis, unspecified hand: Secondary | ICD-10-CM | POA: Diagnosis not present

## 2012-09-02 DIAGNOSIS — Z79899 Other long term (current) drug therapy: Secondary | ICD-10-CM | POA: Diagnosis not present

## 2012-09-11 ENCOUNTER — Telehealth: Payer: Self-pay | Admitting: Pulmonary Disease

## 2012-09-11 MED ORDER — HYDROCOD POLST-CHLORPHEN POLST 10-8 MG/5ML PO LQCR: 5.0000 mL | Freq: Two times a day (BID) | ORAL | Status: DC

## 2012-09-11 NOTE — Telephone Encounter (Signed)
Return call to the nurse please call back [phone]

## 2012-09-11 NOTE — Telephone Encounter (Signed)
Per SN--  tussionex is the best.  1 tsp every 12 hours as needed for cough  #6oz.  Called and spoke with pt and he is aware of SN recs and [facility] [place] [place].  This has been done and pt is aware.

## 2012-09-11 NOTE — Telephone Encounter (Signed)
lmomtcb x1 

## 2012-09-11 NOTE — Telephone Encounter (Signed)
Called, spoke with pt. C/o nonprod "deep" cough, chest tightness, chest congestion, and increased DOE x 1 wk.  No chest pain, wheezing, PND, sneezing, watery eyes, or f/c/s.  Is taking otc guaifenesin.  Leaving next Friday to go to [place] x 2 wks.  Requesting something be called in.  Dr. Kriste Basque, pls advise.  Thank you.  Last OV with SN: 01/07/2012; asked to f/u in 6 months No pending OV  [facility] [address]  Allergies verified with pt:  Allergies  Allergen Reactions  . Sulfonamide Derivatives     REACTION: rash  . Sulfamethoxazole W-Trimethoprim Rash    REACTION: pt states allergic to SULFA drugs/ Rash     **  Pt requesting we leave a detailed msg when calling back if no answer.  He will call back if he has any questions or concerns.

## 2012-11-02 ENCOUNTER — Telehealth: Payer: Self-pay | Admitting: Pulmonary Disease

## 2012-11-02 DIAGNOSIS — M549 Dorsalgia, unspecified: Secondary | ICD-10-CM

## 2012-11-02 NOTE — Telephone Encounter (Signed)
Spoke with patient-- Reports he is having increased back pain especially when walking Says when he is out walking Left leg begins to go numb States his brother has a similar problem and Dr.Jerry Cram--[facility] [address] Jacky Kindle  is caring for patient's brother Patient would like to go ahead and see neuro before his problem becomes as severe as his brother Dr. Kriste Basque please advise if referral can be placed, thank you!  Last OV: 01/07/12 w 6 month f/u not shceduled

## 2012-11-02 NOTE — Telephone Encounter (Signed)
lmomtcb x1 

## 2012-11-02 NOTE — Telephone Encounter (Signed)
Pt returned call. [phone]. [name]

## 2012-11-04 NOTE — Telephone Encounter (Signed)
Per SN----  Ok to refer the pt to see Dr. Donalee Citrin for increased back pain---esp when walking.  thanks

## 2012-11-04 NOTE — Telephone Encounter (Signed)
Referral sent. Pt aware. °

## 2012-11-05 ENCOUNTER — Telehealth: Payer: Self-pay | Admitting: Pulmonary Disease

## 2012-11-05 MED ORDER — DIPHENHYD-HYDROCORT-NYSTATIN MT SUSP: OROMUCOSAL | Status: DC

## 2012-11-05 NOTE — Telephone Encounter (Signed)
I spoke with pt. He stated has mouth ulcers and is requesting refills for MMW.   Per leigh okay x 5 refills.   Pt aware rx sent

## 2012-11-10 DIAGNOSIS — L570 Actinic keratosis: Secondary | ICD-10-CM | POA: Diagnosis not present

## 2012-11-21 DIAGNOSIS — H6690 Otitis media, unspecified, unspecified ear: Secondary | ICD-10-CM | POA: Diagnosis not present

## 2012-11-21 DIAGNOSIS — J029 Acute pharyngitis, unspecified: Secondary | ICD-10-CM | POA: Diagnosis not present

## 2012-12-01 ENCOUNTER — Other Ambulatory Visit: Payer: Self-pay | Admitting: Neurosurgery

## 2012-12-01 DIAGNOSIS — M4800 Spinal stenosis, site unspecified: Secondary | ICD-10-CM | POA: Diagnosis not present

## 2012-12-01 DIAGNOSIS — M5126 Other intervertebral disc displacement, lumbar region: Secondary | ICD-10-CM | POA: Diagnosis not present

## 2012-12-02 DIAGNOSIS — E291 Testicular hypofunction: Secondary | ICD-10-CM | POA: Diagnosis not present

## 2012-12-06 ENCOUNTER — Ambulatory Visit
Admission: RE | Admit: 2012-12-06 | Discharge: 2012-12-06 | Disposition: A | Discharge status: Home / Self Care | Payer: Medicare Other | Source: Ambulatory Visit | Attending: Neurosurgery | Admitting: Neurosurgery

## 2012-12-06 DIAGNOSIS — M4800 Spinal stenosis, site unspecified: Secondary | ICD-10-CM

## 2012-12-06 DIAGNOSIS — M48061 Spinal stenosis, lumbar region without neurogenic claudication: Secondary | ICD-10-CM | POA: Diagnosis not present

## 2012-12-06 DIAGNOSIS — M5126 Other intervertebral disc displacement, lumbar region: Secondary | ICD-10-CM | POA: Diagnosis not present

## 2012-12-06 DIAGNOSIS — M47817 Spondylosis without myelopathy or radiculopathy, lumbosacral region: Secondary | ICD-10-CM | POA: Diagnosis not present

## 2012-12-09 DIAGNOSIS — E291 Testicular hypofunction: Secondary | ICD-10-CM | POA: Diagnosis not present

## 2012-12-09 DIAGNOSIS — N400 Enlarged prostate without lower urinary tract symptoms: Secondary | ICD-10-CM | POA: Diagnosis not present

## 2012-12-09 DIAGNOSIS — L723 Sebaceous cyst: Secondary | ICD-10-CM | POA: Diagnosis not present

## 2012-12-09 DIAGNOSIS — N490 Inflammatory disorders of seminal vesicle: Secondary | ICD-10-CM | POA: Diagnosis not present

## 2012-12-29 DIAGNOSIS — M4800 Spinal stenosis, site unspecified: Secondary | ICD-10-CM | POA: Diagnosis not present

## 2012-12-29 DIAGNOSIS — M5126 Other intervertebral disc displacement, lumbar region: Secondary | ICD-10-CM | POA: Diagnosis not present

## 2012-12-29 DIAGNOSIS — J029 Acute pharyngitis, unspecified: Secondary | ICD-10-CM | POA: Diagnosis not present

## 2012-12-29 DIAGNOSIS — H65199 Other acute nonsuppurative otitis media, unspecified ear: Secondary | ICD-10-CM | POA: Diagnosis not present

## 2012-12-29 DIAGNOSIS — H60399 Other infective otitis externa, unspecified ear: Secondary | ICD-10-CM | POA: Diagnosis not present

## 2013-01-14 DIAGNOSIS — M5126 Other intervertebral disc displacement, lumbar region: Secondary | ICD-10-CM | POA: Diagnosis not present

## 2013-01-20 DIAGNOSIS — M5126 Other intervertebral disc displacement, lumbar region: Secondary | ICD-10-CM | POA: Diagnosis not present

## 2013-01-26 DIAGNOSIS — M5126 Other intervertebral disc displacement, lumbar region: Secondary | ICD-10-CM | POA: Diagnosis not present

## 2013-02-03 ENCOUNTER — Ambulatory Visit (INDEPENDENT_AMBULATORY_CARE_PROVIDER_SITE_OTHER): Payer: Medicare Other | Admitting: Cardiovascular Disease

## 2013-02-03 ENCOUNTER — Encounter: Payer: Self-pay | Admitting: Cardiovascular Disease

## 2013-02-03 VITALS — BP 140/90 | HR 85 | Ht 68.0 in | Wt 181.0 lb

## 2013-02-03 DIAGNOSIS — I2510 Atherosclerotic heart disease of native coronary artery without angina pectoris: Secondary | ICD-10-CM

## 2013-02-03 DIAGNOSIS — I10 Essential (primary) hypertension: Secondary | ICD-10-CM | POA: Diagnosis not present

## 2013-02-03 DIAGNOSIS — E785 Hyperlipidemia, unspecified: Secondary | ICD-10-CM

## 2013-02-03 MED ORDER — CARVEDILOL 12.5 MG PO TABS: 12.5000 mg | ORAL_TABLET | Freq: Two times a day (BID) | ORAL | Status: DC

## 2013-02-03 MED ORDER — LOSARTAN POTASSIUM 100 MG PO TABS: 100.0000 mg | ORAL_TABLET | Freq: Every day | ORAL | Status: DC

## 2013-02-03 MED ORDER — PANTOPRAZOLE SODIUM 40 MG PO TBEC: 40.0000 mg | DELAYED_RELEASE_TABLET | Freq: Every day | ORAL | Status: DC

## 2013-02-03 MED ORDER — ATORVASTATIN CALCIUM 20 MG PO TABS: 20.0000 mg | ORAL_TABLET | Freq: Every day | ORAL | Status: DC

## 2013-02-03 NOTE — Patient Instructions (Signed)
Your physician recommends that you return for a FASTING Lipid and Liver Profile--nothing to eat or drink after midnight, lab opens at 7:30  Your physician wants you to follow-up in: 6 MONTHS with Dr Excell Seltzer. You will receive a reminder letter in the mail two months in advance. If you don't receive a letter, please call our office to schedule the follow-up appointment.  Your physician has recommended you make the following change in your medication: STOP Niacin  Please call the office and schedule an Adenosine Myoview if you are going to proceed with back surgery.

## 2013-02-03 NOTE — Progress Notes (Signed)
HPI:  [name] returns for follow-up evaluation. He is a 67 year-old gentleman with CAD, LBBB, essential HTN, and chronic dyspnea. He has chest discomfort with emotional stress or anger. He has no exertional symptoms but hasn't been exercising much. He's developed some low back problems and is contemplating surgery with Dr Wynetta Emery. Mild chronic dyspnea is unchanged. No edema or palpitations. He would like to stop Niacin because of episodic flushing.  Outpatient Encounter Prescriptions as of 02/03/2013  Medication Sig Dispense Refill  . ANDROGEL PUMP 20.25 MG/ACT (1.62%) GEL Apply 2 Act topically daily.       Marland Kitchen aspirin EC 81 MG tablet Take 81 mg by mouth daily.      Marland Kitchen atorvastatin (LIPITOR) 20 MG tablet Take 20 mg by mouth daily with supper.      Marland Kitchen b complex vitamins tablet Take 1 tablet by mouth daily.       . carvedilol (COREG) 12.5 MG tablet Take 1 tablet (12.5 mg total) by mouth 2 (two) times daily with a meal.  180 tablet  1  . celecoxib (CELEBREX) 100 MG capsule Take 100 mg by mouth daily.      . Coenzyme Q10 (CO Q 10) 100 MG CAPS Take 1 capsule by mouth daily.       . Diphenhyd-Hydrocort-Nystatin SUSP 1 tsp swish, gargle and swallow 4 times daily  140 mL  5  . fish oil-omega-3 fatty acids 1000 MG capsule Take 1 g by mouth daily.       . folic acid (FOLVITE) 400 MCG tablet Take 400 mcg by mouth daily.       . Glucosamine-Chondroitin 1500-1200 MG/30ML LIQD Take 1 tablet by mouth daily.       Marland Kitchen losartan (COZAAR) 100 MG tablet Take 100 mg by mouth daily with lunch.      . Multiple Vitamin (MULTIVITAMIN) capsule Take 1 capsule by mouth daily.        . niacin (SLO-NIACIN) 500 MG tablet Take 500 mg by mouth daily.       . pantoprazole (PROTONIX) 40 MG tablet Take 40 mg by mouth daily before breakfast.       . traMADol (ULTRAM) 50 MG tablet Take 50 mg by mouth 3 (three) times daily as needed for pain.       . [DISCONTINUED] cephALEXin (KEFLEX) 500 MG capsule Take 500 mg by mouth 4 (four) times daily.  Take for 5 days      . [DISCONTINUED] cephALEXin (KEFLEX) 500 MG capsule Take 1 capsule (500 mg total) by mouth 2 (two) times daily.  10 capsule  0  . [DISCONTINUED] chlorpheniramine-HYDROcodone (TUSSIONEX PENNKINETIC ER) 10-8 MG/5ML LQCR Take 5 mLs by mouth every 12 (twelve) hours.  140 mL  0  . [DISCONTINUED] oxyCODONE-acetaminophen (ROXICET) 5-325 MG per tablet Take 1 tablet by mouth every 4 (four) hours as needed for pain.  30 tablet  0   No facility-administered encounter medications on file as of 02/03/2013.    Allergies  Allergen Reactions  . Sulfonamide Derivatives     REACTION: rash  . Sulfamethoxazole W-Trimethoprim Rash    REACTION: pt states allergic to SULFA drugs/ Rash    Past Medical History  Diagnosis Date  . HTN (hypertension)   . Hyperlipidemia   . Allergic rhinitis   . Hemoptysis   . Hiatal hernia   . GERD (gastroesophageal reflux disease)   . Esophageal stricture   . 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 History of colonic polyps     hyperplastic  . Hypertrophy of prostate with urinary obstruction and other lower urinary tract symptoms (LUTS)   . Testicular hypofunction   . OA (osteoarthritis)   . Chronic back pain     intermittent  . Dizziness   . Hx of colonoscopy   . CHF (congestive heart failure)   . Dysrhythmia     right bundle branch block   . CAD (coronary artery disease)     hx of stent- 2008  . Shortness of breath     with exertion  . OSA (obstructive sleep apnea)     cpap- 10     ROS: Negative except as per HPI  BP 140/90  Pulse 85  Ht 5\' 8"  (1.727 m)  Wt 181 lb (82.101 kg)  BMI 27.53 kg/m2  PHYSICAL EXAM: Pt is alert and oriented, NAD HEENT: normal Neck: JVP - normal, carotids 2+= without bruits Lungs: CTA bilaterally CV: RRR without murmur or gallop Abd: soft, NT, Positive BS, no hepatomegaly Ext: no C/C/E, distal pulses intact and  equal Skin: warm/dry no rash  EKG:  NSR with LBBB, no change from previous tracings.  ASSESSMENT AND PLAN: 1. CAD, native vessel. Stable angina pattern. If back surgery would favor preop stress test since he has a LBBB, known CAD, and anginal sx's with emotional stress. Continue same med Rx. Needs to push exercise and we discussed this.  2. HTN - borderline control. Discussed weight loss and exercise. He doesn't want more medication. We talked about increasing coreg but he is concerned about fatigue.  3. Hyperlipidemia - on statin and niacin. Will stop niacin and check lipids. Reviewed data and probable lack of benefit of niacin when on background statin Rx.  For follow-up I will see him back in 6 months. Will check lipids and will do an adenosine myoview if he decides on surgery.  Tonny Bollman 02/03/2013 9:17 AM

## 2013-02-04 ENCOUNTER — Other Ambulatory Visit (INDEPENDENT_AMBULATORY_CARE_PROVIDER_SITE_OTHER): Payer: Medicare Other

## 2013-02-04 DIAGNOSIS — I10 Essential (primary) hypertension: Secondary | ICD-10-CM | POA: Diagnosis not present

## 2013-02-04 DIAGNOSIS — E785 Hyperlipidemia, unspecified: Secondary | ICD-10-CM | POA: Diagnosis not present

## 2013-02-04 DIAGNOSIS — I2510 Atherosclerotic heart disease of native coronary artery without angina pectoris: Secondary | ICD-10-CM

## 2013-02-04 LAB — LIPID PANEL
HDL: 35.2 mg/dL — ABNORMAL LOW (ref >39.00)
Total CHOL/HDL Ratio: 4
VLDL: 38.4 mg/dL (ref 0.0–40.0)

## 2013-02-04 LAB — HEPATIC FUNCTION PANEL: Total Bilirubin: 1.1 mg/dL (ref 0.3–1.2)

## 2013-02-08 DIAGNOSIS — M19079 Primary osteoarthritis, unspecified ankle and foot: Secondary | ICD-10-CM | POA: Diagnosis not present

## 2013-02-08 DIAGNOSIS — M1710 Unilateral primary osteoarthritis, unspecified knee: Secondary | ICD-10-CM | POA: Diagnosis not present

## 2013-02-08 DIAGNOSIS — M19049 Primary osteoarthritis, unspecified hand: Secondary | ICD-10-CM | POA: Diagnosis not present

## 2013-02-08 DIAGNOSIS — M5137 Other intervertebral disc degeneration, lumbosacral region: Secondary | ICD-10-CM | POA: Diagnosis not present

## 2013-02-09 DIAGNOSIS — E559 Vitamin D deficiency, unspecified: Secondary | ICD-10-CM | POA: Diagnosis not present

## 2013-02-09 DIAGNOSIS — Z79899 Other long term (current) drug therapy: Secondary | ICD-10-CM | POA: Diagnosis not present

## 2013-02-26 DIAGNOSIS — Z23 Encounter for immunization: Secondary | ICD-10-CM | POA: Diagnosis not present

## 2013-03-18 DIAGNOSIS — M5126 Other intervertebral disc displacement, lumbar region: Secondary | ICD-10-CM | POA: Diagnosis not present

## 2013-03-18 DIAGNOSIS — E663 Overweight: Secondary | ICD-10-CM | POA: Diagnosis not present

## 2013-03-19 ENCOUNTER — Other Ambulatory Visit: Payer: Self-pay | Admitting: Neurosurgery

## 2013-03-19 DIAGNOSIS — M545 Low back pain: Secondary | ICD-10-CM

## 2013-03-24 ENCOUNTER — Other Ambulatory Visit: Payer: Medicare Other

## 2013-04-02 ENCOUNTER — Ambulatory Visit
Admission: RE | Admit: 2013-04-02 | Discharge: 2013-04-02 | Disposition: A | Discharge status: Home / Self Care | Payer: BLUE CROSS/BLUE SHIELD | Source: Ambulatory Visit | Attending: Neurosurgery | Admitting: Neurosurgery

## 2013-04-02 ENCOUNTER — Other Ambulatory Visit: Payer: Self-pay | Admitting: Neurosurgery

## 2013-04-02 DIAGNOSIS — M545 Low back pain: Secondary | ICD-10-CM

## 2013-04-02 DIAGNOSIS — M5126 Other intervertebral disc displacement, lumbar region: Secondary | ICD-10-CM | POA: Diagnosis not present

## 2013-04-02 MED ORDER — IOHEXOL 180 MG/ML  SOLN
1.0000 mL | Freq: Once | INTRAMUSCULAR | Status: AC | PRN
  Administered 2013-04-02: 1 mL via EPIDURAL

## 2013-04-02 MED ORDER — METHYLPREDNISOLONE ACETATE 40 MG/ML INJ SUSP (RADIOLOG
120.0000 mg | Freq: Once | INTRAMUSCULAR | Status: AC
  Administered 2013-04-02: 120 mg via EPIDURAL

## 2013-04-23 DIAGNOSIS — M7130 Other bursal cyst, unspecified site: Secondary | ICD-10-CM | POA: Diagnosis not present

## 2013-04-23 DIAGNOSIS — M4800 Spinal stenosis, site unspecified: Secondary | ICD-10-CM | POA: Insufficient documentation

## 2013-05-03 DIAGNOSIS — L570 Actinic keratosis: Secondary | ICD-10-CM | POA: Diagnosis not present

## 2013-05-12 ENCOUNTER — Telehealth: Payer: Self-pay | Admitting: Pulmonary Disease

## 2013-05-12 MED ORDER — OSELTAMIVIR PHOSPHATE 75 MG PO CAPS: 75.0000 mg | ORAL_CAPSULE | Freq: Two times a day (BID) | ORAL | Status: DC

## 2013-05-12 NOTE — Telephone Encounter (Signed)
Pt aware of recs. RX has been sent. Nothing further needed 

## 2013-05-12 NOTE — Telephone Encounter (Signed)
Per SN---  tamiflu 75 mg  #10  1 po bid 

## 2013-05-12 NOTE — Telephone Encounter (Signed)
Called spoke with patient who reported that he and his spouse have been keeping their grandson x3 days -- grandson began having symptoms late last night/early this morning and was just diagnosed with the flu.  Pt denies any symptoms now but is requesting Tamiflu prophylactic ly.  Pt is currently out of town in [place], but did [facility].  Pt would like to remind SN that he has CHF, which is why he is especially concerned about contracting the flu. Allergies  Allergen Reactions  . Sulfamethoxazole-Trimethoprim Rash       . Sulfonamide Derivatives Rash        Last ov 8.20.13 w/ SN, was to follow up in 6 months but no appt was ever made and pt has none pending.  Dr Kriste Basque please advise, thank you.

## 2013-06-07 DIAGNOSIS — E291 Testicular hypofunction: Secondary | ICD-10-CM | POA: Diagnosis not present

## 2013-06-14 DIAGNOSIS — N400 Enlarged prostate without lower urinary tract symptoms: Secondary | ICD-10-CM | POA: Diagnosis not present

## 2013-06-14 DIAGNOSIS — E291 Testicular hypofunction: Secondary | ICD-10-CM | POA: Diagnosis not present

## 2013-08-06 ENCOUNTER — Ambulatory Visit: Payer: Medicare Other | Admitting: Cardiovascular Disease

## 2013-08-06 ENCOUNTER — Encounter: Payer: Self-pay | Admitting: Cardiovascular Disease

## 2013-08-06 ENCOUNTER — Ambulatory Visit (INDEPENDENT_AMBULATORY_CARE_PROVIDER_SITE_OTHER): Payer: Medicare Other | Admitting: Cardiovascular Disease

## 2013-08-06 VITALS — BP 123/65 | HR 86 | Ht 68.0 in | Wt 181.0 lb

## 2013-08-06 DIAGNOSIS — I2510 Atherosclerotic heart disease of native coronary artery without angina pectoris: Secondary | ICD-10-CM | POA: Diagnosis not present

## 2013-08-06 DIAGNOSIS — I10 Essential (primary) hypertension: Secondary | ICD-10-CM | POA: Diagnosis not present

## 2013-08-06 DIAGNOSIS — E785 Hyperlipidemia, unspecified: Secondary | ICD-10-CM

## 2013-08-06 NOTE — Patient Instructions (Signed)
Your physician recommends that you continue on your current medications as directed. Please refer to the Current Medication list given to you today.  Your physician wants you to follow-up in: 6 months with Dr. Burt Knack with lab work before or same day as office visit. You will receive a reminder letter in the mail two months in advance. If you don't receive a letter, please call our office to schedule the follow-up appointment.  Your physician recommends that you return for FASTING lab work in: 6 months before your office visit with Dr. Emelda Fear will need to fast for this appointment - do not eat or drink anything after midnight the night before except water

## 2013-08-09 ENCOUNTER — Encounter: Payer: Self-pay | Admitting: Cardiovascular Disease

## 2013-08-09 NOTE — Progress Notes (Signed)
HPI:  68 year-old male returning for follow-up of CAD, LBBB, essential HTN, and chronic dyspnea. He reports no change in symptoms since I last saw him 6 months ago. He has not been exercising as regularly. He has no lifestyle limiting symptoms at present. Specifically he denies chest pain or pressure, edema, orthopnea, or PND. He has longstanding dyspnea with exertion but this does not limit his normal activities. He's compliant with medications.  Outpatient Encounter Prescriptions as of 08/06/2013  Medication Sig  . ANDROGEL PUMP 20.25 MG/ACT (1.62%) GEL Apply 2 Act topically daily.   Marland Kitchen aspirin EC 81 MG tablet Take 81 mg by mouth daily.  Marland Kitchen atorvastatin (LIPITOR) 20 MG tablet Take 1 tablet (20 mg total) by mouth daily with supper.  Marland Kitchen b complex vitamins tablet Take 1 tablet by mouth daily.   . carvedilol (COREG) 12.5 MG tablet Take 1 tablet (12.5 mg total) by mouth 2 (two) times daily with a meal.  . celecoxib (CELEBREX) 100 MG capsule Take 100 mg by mouth daily.  . Coenzyme Q10 (CO Q 10) 100 MG CAPS Take 1 capsule by mouth daily.   . Diphenhyd-Hydrocort-Nystatin SUSP 1 tsp swish, gargle and swallow 4 times daily  . fish oil-omega-3 fatty acids 1000 MG capsule Take 1 g by mouth daily.   . folic acid (FOLVITE) 427 MCG tablet Take 400 mcg by mouth daily.   . Glucosamine-Chondroitin 1500-1200 MG/30ML LIQD Take 1 tablet by mouth daily.   Marland Kitchen losartan (COZAAR) 100 MG tablet Take 1 tablet (100 mg total) by mouth daily with lunch.  . Multiple Vitamin (MULTIVITAMIN) capsule Take 1 capsule by mouth daily.    . pantoprazole (PROTONIX) 40 MG tablet Take 1 tablet (40 mg total) by mouth daily before breakfast.  . traMADol (ULTRAM) 50 MG tablet Take 50 mg by mouth 3 (three) times daily as needed for pain.   . [DISCONTINUED] oseltamivir (TAMIFLU) 75 MG capsule Take 1 capsule (75 mg total) by mouth 2 (two) times daily.    Allergies  Allergen Reactions  . Sulfamethoxazole-Trimethoprim Rash       .  Sulfonamide Derivatives Rash         Past Medical History  Diagnosis Date  . HTN (hypertension)   . Hyperlipidemia   . Allergic rhinitis   . Hemoptysis   . Hiatal hernia   . GERD (gastroesophageal reflux disease)   . Esophageal stricture   . 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 History of colonic polyps     hyperplastic  . Hypertrophy of prostate with urinary obstruction and other lower urinary tract symptoms (LUTS)   . Testicular hypofunction   . OA (osteoarthritis)   . Chronic back pain     intermittent  . Dizziness   . Hx of colonoscopy   . CHF (congestive heart failure)   . Dysrhythmia     right bundle branch block   . CAD (coronary artery disease)     hx of stent- 2008  . Shortness of breath     with exertion  . OSA (obstructive sleep apnea)     cpap- 10    BP 123/65  Pulse 86  Ht 5\' 8"  (1.727 m)  Wt 181 lb (82.101 kg)  BMI 27.53 kg/m2  PHYSICAL EXAM: Pt is alert and oriented, NAD HEENT: normal Neck: JVP - normal, carotids 2+= without bruits Lungs: CTA bilaterally CV: RRR without murmur  or gallop Abd: soft, NT, Positive BS, no hepatomegaly Ext: no C/C/E, distal pulses intact and equal Skin: warm/dry no rash  EKG:  Sinus rhythm with LBBB, no change from previous  ASSESSMENT AND PLAN: 1. CAD, native vessel. Minimal symptoms on stable medical regimen. He will follow-up in 6 months.  2. HTN - BP well-controlled. Continue same med Rx. Importance of exercise, diet, low-sodium reviewed with patient.   3. Hyperlipidemia. Continue atorvastatin 20 mg. Last lipids reviewed (LDL 59).  For follow-up I'll see him back in 6 months with fasting lipids.  [name] 08/09/2013 10:46 PM

## 2013-08-18 ENCOUNTER — Telehealth: Payer: Self-pay | Admitting: Gastroenterology

## 2013-08-18 ENCOUNTER — Telehealth: Payer: Self-pay | Admitting: Pulmonary Disease

## 2013-08-18 DIAGNOSIS — H9209 Otalgia, unspecified ear: Secondary | ICD-10-CM

## 2013-08-18 NOTE — Telephone Encounter (Signed)
Called spoke with pt. Requesting a referral to ENT for ear ache that comes and goes x 6 months. Please advise SN thanks

## 2013-08-18 NOTE — Telephone Encounter (Signed)
Line rings busy  

## 2013-08-19 NOTE — Telephone Encounter (Signed)
Per SN----  Refer to ENT ---Dr. Karrie Doffing for eval of ear pain

## 2013-08-19 NOTE — Telephone Encounter (Signed)
Pt is aware. Order has been placed. Nothing further is needed.

## 2013-08-23 NOTE — Telephone Encounter (Signed)
Pt has been scheduled with Nevin Bloodgood for 08/26/13 930 am for nausea after eating

## 2013-08-23 NOTE — Telephone Encounter (Signed)
Unable to reach pt no voice mail.

## 2013-08-24 ENCOUNTER — Encounter: Payer: Self-pay | Admitting: *Deleted

## 2013-08-27 ENCOUNTER — Ambulatory Visit (INDEPENDENT_AMBULATORY_CARE_PROVIDER_SITE_OTHER): Payer: Medicare Other | Admitting: Nurse Practitioner

## 2013-08-27 ENCOUNTER — Encounter: Payer: Self-pay | Admitting: Nurse Practitioner

## 2013-08-27 VITALS — BP 120/60 | HR 60 | Ht 68.0 in | Wt 179.0 lb

## 2013-08-27 DIAGNOSIS — Z1211 Encounter for screening for malignant neoplasm of colon: Secondary | ICD-10-CM | POA: Insufficient documentation

## 2013-08-27 DIAGNOSIS — I2510 Atherosclerotic heart disease of native coronary artery without angina pectoris: Secondary | ICD-10-CM

## 2013-08-27 DIAGNOSIS — R110 Nausea: Secondary | ICD-10-CM

## 2013-08-27 NOTE — Patient Instructions (Addendum)
It has been recommended to you by your physician that you have a(n) Colonoscopy completed. Per your request, we did add it to the recall date for your Upper Endoscopy. You will receive a letter in the mail as a reminder when it gets closer to that time.  You have been scheduled for a gastric emptying scan at [facility] on 09-15-2013 at 8 am. Please arrive at least 15 minutes prior to your appointment for registration. Please make certain not to have anything to eat or drink after midnight the night before your test. Hold all stomach medications (ex: Zofran, phenergan, Reglan) 48 hours prior to your test. If you need to reschedule your appointment, please contact radiology scheduling at [phone]. _____________________________________________________________________ A gastric-emptying study measures how long it takes for food to move through your stomach. There are several ways to measure stomach emptying. In the most common test, you eat food that contains a small amount of radioactive material. A scanner that detects the movement of the radioactive material is placed over your abdomen to monitor the rate at which food leaves your stomach. This test normally takes about 2 hours to complete. _____________________________________________________________________

## 2013-08-27 NOTE — Progress Notes (Signed)
HPI :  Patient is a 68 year old male known to Dr. Ardis Hughs. 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 See excerpt from Dr. Eugenia Pancoast November 2012 below:   incidentally noted duodenal polyp partially resected endoscopically TVA (09/2008), repeat procedure December 2012 found small residual tubular adenoma at the site, appeared to be completely removed endoscopically, he was recommended to have a repeat examination in 2 years  2. antral subepithelial lesion noted 90mm by 72mm by endoscopic ultrasound May 2010 ; measured 8.66mm by 3.17mm on repeat endoscopic ultrasound December 2010.   A follow EGD was done Dec 2012  (following above visit) with findings of a schatzki's ring, sessile duodenal polyp and unchanged submucosal gastric lesion. Duodenal polyp c/w adenoma.He is due for surveillance EGD Dec 2015.   Patient is here today for evaluation of nausea. He gives a two year history of postprandial nausea, especially after consumption of fatty foods. No abdominal pain. No weight loss, just gain. Patient reviewed home meds, nausea potential side effect of all. He is on a daily PPI. No narcotic use.   Past Medical History  Diagnosis Date  . HTN (hypertension)   . Hyperlipidemia   . Allergic rhinitis   . Hemoptysis   . Hiatal hernia   . GERD (gastroesophageal reflux disease)   . Esophageal stricture   . 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 History of colonic polyps     hyperplastic  . Hypertrophy of prostate with urinary obstruction and other lower urinary tract symptoms (LUTS)   . Testicular hypofunction   . OA (osteoarthritis)   . Chronic back pain     intermittent  . Dizziness   . CHF (congestive heart failure)   . Dysrhythmia     right bundle branch block   . CAD (coronary artery disease)     hx of stent- 2008  . Shortness of breath     with exertion  .  OSA (obstructive sleep apnea)     cpap- 10      Family History  Problem Relation Age of Onset  . Coronary artery disease Father   . Diabetes Father   . Sudden death Father     due to heart disease  . Heart disease Father   . Hypertension    . Colon cancer Neg Hx   . Heart disease Mother   . Stomach cancer Paternal Grandmother    History  Substance Use Topics  . Smoking status: Former Smoker    Quit date: 05/20/1990  . Smokeless tobacco: Never Used     Comment: previous 30 pack year history  . Alcohol Use: Yes     Comment: rare   Current Outpatient Prescriptions  Medication Sig Dispense Refill  . ANDROGEL PUMP 20.25 MG/ACT (1.62%) GEL Apply 2 Act topically daily.       Marland Kitchen aspirin EC 81 MG tablet Take 81 mg by mouth daily.      Marland Kitchen atorvastatin (LIPITOR) 20 MG tablet Take 1 tablet (20 mg total) by mouth daily with supper.  90 tablet  3  . b complex vitamins tablet Take 1 tablet by mouth daily.       . carvedilol (COREG) 12.5 MG tablet Take 1 tablet (12.5 mg total) by mouth 2 (two) times daily with a meal.  180 tablet  3  . celecoxib (CELEBREX) 200 MG capsule  Take 200 mg by mouth daily.      . Coenzyme Q10 (CO Q 10) 100 MG CAPS Take 1 capsule by mouth daily.       . Diphenhyd-Hydrocort-Nystatin SUSP 1 tsp swish, gargle and swallow 4 times daily prn      . fish oil-omega-3 fatty acids 1000 MG capsule Take 1 g by mouth daily.       . folic acid (FOLVITE) 416 MCG tablet Take 400 mcg by mouth daily.       . Glucosamine-Chondroitin 1500-1200 MG/30ML LIQD Take 1 tablet by mouth daily.       Marland Kitchen losartan (COZAAR) 100 MG tablet Take 1 tablet (100 mg total) by mouth daily with lunch.  90 tablet  3  . Multiple Vitamin (MULTIVITAMIN) capsule Take 1 capsule by mouth daily.        . NON FORMULARY CPAP machine      . pantoprazole (PROTONIX) 40 MG tablet Take 1 tablet (40 mg total) by mouth daily before breakfast.  90 tablet  3  . traMADol (ULTRAM) 50 MG tablet Take 50 mg by mouth 3 (three) times  daily as needed for pain.        No current facility-administered medications for this visit.   Allergies  Allergen Reactions  . Sulfamethoxazole-Trimethoprim Rash       . Sulfonamide Derivatives Rash        Review of Systems: All systems reviewed and negative except where noted in HPI.    ENDOSCOPY PROCEDURE REPORT  PATIENT: [name], [name] MR#: [id number]  BIRTHDATE: 09-09-1945, 25 yrs. old GENDER: male  ENDOSCOPIST: [name], MD  PROCEDURE DATE: 04/26/2011  PROCEDURE: EGD with biopsy, 43239, EGD with balloon dilatation  ASA CLASS: Class II  INDICATIONS: 1. incidentally noted duodenal polyp partially  resected endoscopically TVA (09/2008), repeat procedure December  2012 found small residual tubular adenoma at the site, appeared to  be completely removed endoscopically, he was recommended to have a  repeat examination in 2 years  2. antral subepithelial lesion noted 18mm by 16mm by endoscopic  ultrasound May 2010 ; measured 8.27mm by 3.83mm on repeat endoscopic  ultrasound December 2010  MEDICATIONS: Fentanyl 50 mcg IV, Versed 6 mg IV, These  medications were titrated to patient response per physician's  verbal order  TOPICAL ANESTHETIC: Cetacaine Spray  DESCRIPTION OF PROCEDURE: After the risks benefits and  alternatives of the procedure were thoroughly explained, informed  consent was obtained. The LB [id number] endoscope was  introduced through the mouth and advanced to the second portion of  the duodenum, without limitations. The instrument was slowly  withdrawn as the mucosa was fully examined.  <<PROCEDUREIMAGES>>  A Schatzki's ring was found. This was dilated up to 52mm with CRE  TTS balloon. There was no bleeding following 1 minute dilation  (see image1 and image9). There was a small amount of residual,  recurrent polyp tissue in second duodenum. This was removed and  sent to pathology (jar 1) (see image4). submucosal nodule. The  previously noted  subepthilial lesion in gastric antrum was  unchanged (see image2). Otherwise the examination was normal (see  image3 and image6). Retroflexed views revealed no  abnormalities. The scope was then withdrawn from the patient  and the procedure completed.  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 This was small, removed and sent  to pathology  3) Submucosal nodule has not changed in size since 2010  4) Otherwise normal examination  RECOMMENDATIONS:  Await final pathology (likely repeat EGD for polyp surveillance  in 2-3 years).  Can repeat dilation as needed.  The subepithelial lesion in stomach has not changed in size and  I do not think it requires dedicated surveillance.  ______________________________  [name], MD  n.  Duodenal biopsy :  Tubular adenoma  Physical Exam: BP 120/60  Pulse 60  Ht 5\' 8"  (1.727 m)  Wt 179 lb (81.194 kg)  BMI 27.22 kg/m2 Constitutional: Pleasant,well-developed, white male in no acute distress. HEENT: Normocephalic and atraumatic. Conjunctivae are normal. No scleral icterus. Neck supple.  Cardiovascular: Normal rate, regular rhythm.  Pulmonary/chest: Effort normal and breath sounds normal. No wheezing, rales or rhonchi. Abdominal: Soft, nondistended, nontender. Bowel sounds active throughout. There are no masses palpable. No hepatomegaly. No succussion splash Extremities: no edema Lymphadenopathy: No cervical adenopathy noted. Neurological: Alert and oriented to person place and time. Skin: Skin is warm and dry. No rashes noted. Psychiatric: Normal mood and affect. Behavior is normal.   ASSESSMENT AND PLAN:  7. 68 year old male with two year history of postprandial nausea, especially after fatty meals. No abdominal pain. No weight loss. Rule out gastroparesis, ? Biliary dyskinesia. Lack of weight loss is reassuring. Doubtful nausea related to #3. For further evaluation  patient will be scheduled for gastric emptying study  2. Colon cancer screening. He was due for repeat colonoscopy August 2014. The risks, benefits, and alternatives to colonoscopy with possible biopsy and possible polypectomy were discussed with the patient and he consents to proceed. Patient travels a lot, wants to wait to have colonoscopy at time of EGD in December, will send EGD / colonoscopy recall letter. He has a history of adenomatous colon polyps but only hyperplastic polyp on last colonoscopy in 2009  3. History of duodenal adenomatous polyp. Due for surveillance EGD December 2015.

## 2013-08-30 NOTE — Progress Notes (Signed)
i agree with the plan above 

## 2013-09-01 DIAGNOSIS — H9209 Otalgia, unspecified ear: Secondary | ICD-10-CM | POA: Diagnosis not present

## 2013-09-01 DIAGNOSIS — M26609 Unspecified temporomandibular joint disorder, unspecified side: Secondary | ICD-10-CM | POA: Diagnosis not present

## 2013-09-15 ENCOUNTER — Ambulatory Visit (HOSPITAL_COMMUNITY)
Admission: RE | Admit: 2013-09-15 | Discharge: 2013-09-15 | Disposition: A | Discharge status: Home / Self Care | Payer: Medicare Other | Source: Ambulatory Visit | Attending: Nurse Practitioner | Admitting: Nurse Practitioner

## 2013-09-15 DIAGNOSIS — R110 Nausea: Secondary | ICD-10-CM | POA: Insufficient documentation

## 2013-09-15 DIAGNOSIS — R6881 Early satiety: Secondary | ICD-10-CM | POA: Diagnosis not present

## 2013-09-15 MED ORDER — TECHNETIUM TC 99M SULFUR COLLOID
2.2000 | Freq: Once | INTRAVENOUS | Status: AC | PRN
  Administered 2013-09-15: 2.2 via ORAL

## 2013-09-16 DIAGNOSIS — L03039 Cellulitis of unspecified toe: Secondary | ICD-10-CM | POA: Diagnosis not present

## 2013-09-17 ENCOUNTER — Other Ambulatory Visit: Payer: Self-pay

## 2013-09-17 DIAGNOSIS — R109 Unspecified abdominal pain: Secondary | ICD-10-CM

## 2013-09-30 ENCOUNTER — Ambulatory Visit (HOSPITAL_COMMUNITY)
Admission: RE | Admit: 2013-09-30 | Discharge: 2013-09-30 | Disposition: A | Discharge status: Home / Self Care | Payer: Medicare Other | Source: Ambulatory Visit | Attending: Gastroenterology | Admitting: Gastroenterology

## 2013-09-30 ENCOUNTER — Encounter (HOSPITAL_COMMUNITY): Payer: Self-pay

## 2013-09-30 DIAGNOSIS — R109 Unspecified abdominal pain: Secondary | ICD-10-CM | POA: Insufficient documentation

## 2013-09-30 MED ORDER — SINCALIDE 5 MCG IJ SOLR: 0.0200 ug/kg | Freq: Once | INTRAMUSCULAR | Status: DC

## 2013-09-30 MED ORDER — TECHNETIUM TC 99M MEBROFENIN IV KIT
5.2000 | PACK | Freq: Once | INTRAVENOUS | Status: AC | PRN
  Administered 2013-09-30: 5.2 via INTRAVENOUS

## 2013-11-22 ENCOUNTER — Telehealth: Payer: Self-pay | Admitting: Pulmonary Disease

## 2013-11-22 DIAGNOSIS — K429 Umbilical hernia without obstruction or gangrene: Secondary | ICD-10-CM

## 2013-11-22 NOTE — Telephone Encounter (Signed)
Pt states that he is needing hernia repair (umbilical hernia). Requests referral to a surgeon to have this repaired.  Pt states that this was repaired x 30+years ago and has recently flared back up and is worsening weekly.  Please advise Dr Lenna Gilford. Thanks.

## 2013-11-23 NOTE — Telephone Encounter (Signed)
Per SN---  Refer to [facility]---Dr. Hassell Done, Dr. Lucia Gaskins, Dr. Gershon Crane, Dr. Zella Richer, etc.

## 2013-11-23 NOTE — Telephone Encounter (Signed)
Called spoke with patient and advised of SN's recs to refer to [facility].  Pt stated that he will be out of town from 7/31 - 8/8 and requests to be seen prior to this if at all possible (other than 7/10 as his wife will be having surgery that date).  Referral placed with pt's request added.  Nothing further needed, will sign off.

## 2013-11-30 DIAGNOSIS — H53029 Refractive amblyopia, unspecified eye: Secondary | ICD-10-CM | POA: Diagnosis not present

## 2013-11-30 DIAGNOSIS — H2510 Age-related nuclear cataract, unspecified eye: Secondary | ICD-10-CM | POA: Diagnosis not present

## 2013-11-30 DIAGNOSIS — H524 Presbyopia: Secondary | ICD-10-CM | POA: Diagnosis not present

## 2013-12-06 ENCOUNTER — Encounter (INDEPENDENT_AMBULATORY_CARE_PROVIDER_SITE_OTHER): Payer: Self-pay | Admitting: Surgery

## 2013-12-06 ENCOUNTER — Ambulatory Visit (INDEPENDENT_AMBULATORY_CARE_PROVIDER_SITE_OTHER): Payer: Medicare Other | Admitting: Surgery

## 2013-12-06 VITALS — BP 128/80 | HR 76 | Temp 98.2°F | Resp 16 | Ht 68.0 in | Wt 178.0 lb

## 2013-12-06 DIAGNOSIS — K432 Incisional hernia without obstruction or gangrene: Secondary | ICD-10-CM | POA: Diagnosis not present

## 2013-12-06 DIAGNOSIS — N400 Enlarged prostate without lower urinary tract symptoms: Secondary | ICD-10-CM | POA: Diagnosis not present

## 2013-12-06 DIAGNOSIS — E291 Testicular hypofunction: Secondary | ICD-10-CM | POA: Diagnosis not present

## 2013-12-06 NOTE — Patient Instructions (Signed)
Laparoscopic Ventral Hernia Repair °Laparoscopic ventral hernia repair is a surgery to fix a ventral hernia. A ventral hernia, also called an incisional hernia, is a bulge of body tissue or intestines that pushes through the front part of the abdomen. This can happen if the connective tissue covering the muscles over the abdomen has a weak spot or is torn because of a surgical cut (incision) from a previous surgery. Laparoscopic ventral hernia repair is often done soon after diagnosis to stop the hernia from getting bigger, becoming uncomfortable, or becoming an emergency. This surgery usually takes about 2 hours, but the time can vary greatly. °LET YOUR HEALTH CARE PROVIDER KNOW ABOUT: °· Any allergies you have. °· All medicines you are taking, including steroids, vitamins, herbs, eye drops, creams, and over-the-counter medicines. °· Previous problems you or members of your family have had with the use of anesthetics. °· Any blood disorders you have. °· Previous surgeries you have had. °· Medical conditions you have. °RISKS AND COMPLICATIONS  °Generally, laparoscopic ventral hernia repair is a safe procedure. However, as with any surgical procedure, problems can occur. Possible problems include: °· Bleeding. °· Trouble passing urine or having a bowel movement after the surgery. °· Infection. °· Pneumonia. °· Blood clots. °· Pain in the area of the hernia. °· A bulge in the area of the hernia that may be caused by a collection of fluid. °· Injury to intestines or other structures in the abdomen. °· Return of the hernia after surgery. °In some cases, your health care provider may need to stop the laparoscopic procedure and do regular, open surgery. This may be necessary for very difficult hernias, when organs are hard to see, or when bleeding problems occur during surgery. °BEFORE THE PROCEDURE  °· You may need to have blood tests, urine tests, a chest X-ray, or an electrocardiogram done before the day of the  surgery. °· Ask your health care provider about changing or stopping your regular medicines. This is especially important if you are taking diabetes medicines or blood thinners. °· You may need to wash with a special type of germ-killing soap. °· Do not eat or drink anything after midnight the night before the procedure or as directed by your health care provider. °· Make plans to have someone drive you home after the procedure. °PROCEDURE  °· Small monitors will be put on your body. They are used to check your heart, blood pressure, and oxygen level. °· An IV access tube will be put into a vein in your hand or arm. Fluids and medicine will flow directly into your body through the IV tube. °· You will be given medicine that makes you go to sleep (general anesthetic). °· Your abdomen will be cleaned with a special soap to kill any germs on your skin. °· Once you are asleep, several small incisions will be made in your abdomen. °· The large space in your abdomen will be filled with air so that it expands. This gives your health care provider more room and a better view. °· A thin, lighted tube with a tiny camera on the end (laparoscope) is put through a small incision in your abdomen. The camera on the laparoscope sends a picture to a TV screen in the operating room. This gives your health care provider a good view inside your abdomen. °· Hollow tubes are put through the other small incisions in your abdomen. The tools needed for the procedure are put through these tubes. °· Your health care provider   puts the tissue or intestines that formed the hernia back in place.  A screen-like patch (mesh) is used to close the hernia. This helps make the area stronger. Stitches, tacks, or staples are used to keep the mesh in place.  Medicine and a bandage (dressing) or skin glue will be put over the incisions. AFTER THE PROCEDURE   You will stay in a recovery area until the anesthetic wears off. Your blood pressure and  pulse will be checked often.  You may be able to go home the same day or may need to stay in the hospital for 1-2 days after surgery. Your health care provider will decide when you can go home.  You may feel some pain. You may be given medicine for pain.  You will be urged to do breathing exercises that involve taking deep breaths. This helps prevent a lung infection after a surgery.  You may have to wear compression stockings while you are in the hospital. These stockings help keep blood clots from forming in your legs. Document Released: 04/22/2012 Document Revised: 05/11/2013 Document Reviewed: 04/22/2012 [facility] Patient Information 2015 [place], [place]. This information is not intended to replace advice given to you by your health care provider. Make sure you discuss any questions you have with your health care provider.

## 2013-12-06 NOTE — Progress Notes (Signed)
Patient ID: [name], male   DOB: Sep 25, 1945, 68 y.o.   MRN: [id number]  Chief Complaint  Patient presents with  . Umbilical Hernia    new pt    HPI [name] is a 68 y.o. male.  Patient sent at the request of Dr. Ty Hilts for recurrent incisional hernia. Patient had umbilical hernia repair 30 years ago. He has developed a bulge above his umbilicus at the old scar site. He used to be able to push it out but of late has become more fixed. He has mild discomfort at the site. No change in bowel or bladder function. He does have chronic nausea which she is followed by GI.  No redness or drainage from the site. HPI  Past Medical History  Diagnosis Date  . HTN (hypertension)   . Hyperlipidemia   . Allergic rhinitis   . Hemoptysis   . Hiatal hernia   . GERD (gastroesophageal reflux disease)   . Esophageal stricture   . 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 History of colonic polyps     hyperplastic  . Hypertrophy of prostate with urinary obstruction and other lower urinary tract symptoms (LUTS)   . Testicular hypofunction   . OA (osteoarthritis)   . Chronic back pain     intermittent  . Dizziness   . CHF (congestive heart failure)   . Dysrhythmia     right bundle branch block   . CAD (coronary artery disease)     hx of stent- 2008  . Shortness of breath     with exertion  . OSA (obstructive sleep apnea)     cpap- 10     Past Surgical History  Procedure Laterality Date  . S/p right knee arthroscopy  2005  . Rca stenting    . Hernia surgery x 3    . Oral surg to removed growth from ?sinus  10/2010    ?Dermoid removed by DrRiggs  . Colonoscopy  12/2007    HYPERPLASTIC POLYP  . Upper gastrointestinal endoscopy    . Right toe surgery     . Irrigation and debridement abscess Left 08/14/2012    Procedure: IRRIGATION AND DEBRIDEMENT LEFT INGUINAL BOIL ;  Surgeon: Ailene Rud, MD;  Location: [facility];  Service: Urology;  Laterality: Left;    Family History  Problem Relation Age of Onset  . Coronary artery disease Father   . Diabetes Father   . Sudden death Father     due to heart disease  . Heart disease Father   . Hypertension    . Colon cancer Neg Hx   . Heart disease Mother   . Stomach cancer Paternal Grandmother     Social History History  Substance Use Topics  . Smoking status: Former Smoker    Quit date: 05/20/1990  . Smokeless tobacco: Never Used     Comment: previous 30 pack year history  . Alcohol Use: Yes     Comment: rare    Allergies  Allergen Reactions  . Sulfamethoxazole-Trimethoprim Rash       . Sulfonamide Derivatives Rash         Current Outpatient Prescriptions  Medication Sig Dispense Refill  . ANDROGEL PUMP 20.25 MG/ACT (1.62%) GEL Apply 2 Act topically daily.       Marland Kitchen aspirin EC 81 MG tablet Take 81 mg by mouth daily.      Marland Kitchen  atorvastatin (LIPITOR) 20 MG tablet Take 1 tablet (20 mg total) by mouth daily with supper.  90 tablet  3  . b complex vitamins tablet Take 1 tablet by mouth daily.       . carvedilol (COREG) 12.5 MG tablet Take 1 tablet (12.5 mg total) by mouth 2 (two) times daily with a meal.  180 tablet  3  . celecoxib (CELEBREX) 200 MG capsule Take 200 mg by mouth daily.      . Coenzyme Q10 (CO Q 10) 100 MG CAPS Take 1 capsule by mouth daily.       . Diphenhyd-Hydrocort-Nystatin SUSP 1 tsp swish, gargle and swallow 4 times daily prn      . fish oil-omega-3 fatty acids 1000 MG capsule Take 1 g by mouth daily.       . folic acid (FOLVITE) 676 MCG tablet Take 400 mcg by mouth daily.       . Glucosamine-Chondroitin 1500-1200 MG/30ML LIQD Take 1 tablet by mouth daily.       Marland Kitchen losartan (COZAAR) 100 MG tablet Take 1 tablet (100 mg total) by mouth daily with lunch.  90 tablet  3  . Multiple Vitamin (MULTIVITAMIN) capsule Take 1 capsule by mouth daily.        . NON FORMULARY CPAP machine      . pantoprazole  (PROTONIX) 40 MG tablet Take 1 tablet (40 mg total) by mouth daily before breakfast.  90 tablet  3  . traMADol (ULTRAM) 50 MG tablet Take 50 mg by mouth 3 (three) times daily as needed for pain.        No current facility-administered medications for this visit.    Review of Systems Review of Systems  Constitutional: Negative for fever, chills and unexpected weight change.  HENT: Negative for congestion, hearing loss, sore throat, trouble swallowing and voice change.   Eyes: Negative for visual disturbance.  Respiratory: Negative for cough and wheezing.   Cardiovascular: Negative for chest pain, palpitations and leg swelling.  Gastrointestinal: Positive for nausea. Negative for vomiting, abdominal pain, diarrhea, constipation, blood in stool, abdominal distention, anal bleeding and rectal pain.  Genitourinary: Positive for frequency and difficulty urinating. Negative for hematuria.  Musculoskeletal: Negative for arthralgias.  Skin: Negative for rash and wound.  Neurological: Negative for seizures, syncope, weakness and headaches.  Hematological: Negative for adenopathy. Does not bruise/bleed easily.  Psychiatric/Behavioral: Negative for confusion.    Blood pressure 128/80, pulse 76, temperature 98.2 F (36.8 C), temperature source Temporal, resp. rate 16, height 5\' 8"  (1.727 m), weight 178 lb (80.74 kg).  Physical Exam Physical Exam  Constitutional: He is oriented to person, place, and time. He appears well-developed and well-nourished.  HENT:  Head: Normocephalic.  Mouth/Throat: No oropharyngeal exudate.  Eyes: Pupils are equal, round, and reactive to light. No scleral icterus.  Neck: Normal range of motion. Neck supple.  Cardiovascular: Normal rate and regular rhythm.   Pulmonary/Chest: Effort normal and breath sounds normal.  Abdominal: There is no tenderness. A hernia is present. Hernia confirmed positive in the ventral area.    Musculoskeletal: Normal range of motion.    Neurological: He is alert and oriented to person, place, and time.  Skin: Skin is warm and dry.  Psychiatric: He has a normal mood and affect. His behavior is normal. Judgment and thought content normal.    Data Reviewed none  Assessment    Recurrent incisional hernia    Plan    Recommend repair of recurrent incisional hernia. Operative  approach would be best which I discussed today with the patient. Also discussed with him options. He wishes to have this done in October and will return in September to set up. Will ask  for cardiac clearance.       Davion Meara A. 12/06/2013, 2:52 PM

## 2013-12-07 DIAGNOSIS — D2360 Other benign neoplasm of skin of unspecified upper limb, including shoulder: Secondary | ICD-10-CM | POA: Diagnosis not present

## 2013-12-07 DIAGNOSIS — L219 Seborrheic dermatitis, unspecified: Secondary | ICD-10-CM | POA: Diagnosis not present

## 2013-12-07 DIAGNOSIS — L821 Other seborrheic keratosis: Secondary | ICD-10-CM | POA: Diagnosis not present

## 2013-12-07 DIAGNOSIS — D2370 Other benign neoplasm of skin of unspecified lower limb, including hip: Secondary | ICD-10-CM | POA: Diagnosis not present

## 2013-12-07 DIAGNOSIS — L570 Actinic keratosis: Secondary | ICD-10-CM | POA: Diagnosis not present

## 2013-12-07 DIAGNOSIS — D239 Other benign neoplasm of skin, unspecified: Secondary | ICD-10-CM | POA: Diagnosis not present

## 2013-12-08 DIAGNOSIS — H02819 Retained foreign body in unspecified eye, unspecified eyelid: Secondary | ICD-10-CM | POA: Diagnosis not present

## 2013-12-08 DIAGNOSIS — H2510 Age-related nuclear cataract, unspecified eye: Secondary | ICD-10-CM | POA: Diagnosis not present

## 2013-12-08 DIAGNOSIS — Z189 Retained foreign body fragments, unspecified material: Secondary | ICD-10-CM | POA: Diagnosis not present

## 2013-12-08 DIAGNOSIS — H1030 Unspecified acute conjunctivitis, unspecified eye: Secondary | ICD-10-CM | POA: Diagnosis not present

## 2013-12-09 ENCOUNTER — Ambulatory Visit (INDEPENDENT_AMBULATORY_CARE_PROVIDER_SITE_OTHER): Payer: BLUE CROSS/BLUE SHIELD | Admitting: General Surgery

## 2013-12-13 DIAGNOSIS — E291 Testicular hypofunction: Secondary | ICD-10-CM | POA: Diagnosis not present

## 2013-12-13 DIAGNOSIS — N400 Enlarged prostate without lower urinary tract symptoms: Secondary | ICD-10-CM | POA: Diagnosis not present

## 2013-12-28 DIAGNOSIS — H10509 Unspecified blepharoconjunctivitis, unspecified eye: Secondary | ICD-10-CM | POA: Diagnosis not present

## 2014-01-18 DIAGNOSIS — Z79899 Other long term (current) drug therapy: Secondary | ICD-10-CM | POA: Diagnosis not present

## 2014-01-18 DIAGNOSIS — Z0389 Encounter for observation for other suspected diseases and conditions ruled out: Secondary | ICD-10-CM | POA: Diagnosis not present

## 2014-02-02 ENCOUNTER — Other Ambulatory Visit (INDEPENDENT_AMBULATORY_CARE_PROVIDER_SITE_OTHER): Payer: Medicare Other

## 2014-02-02 ENCOUNTER — Other Ambulatory Visit: Payer: Self-pay

## 2014-02-02 ENCOUNTER — Other Ambulatory Visit: Payer: Self-pay | Admitting: *Deleted

## 2014-02-02 DIAGNOSIS — E785 Hyperlipidemia, unspecified: Secondary | ICD-10-CM | POA: Diagnosis not present

## 2014-02-02 DIAGNOSIS — I2510 Atherosclerotic heart disease of native coronary artery without angina pectoris: Secondary | ICD-10-CM

## 2014-02-02 DIAGNOSIS — I10 Essential (primary) hypertension: Secondary | ICD-10-CM

## 2014-02-02 DIAGNOSIS — H10509 Unspecified blepharoconjunctivitis, unspecified eye: Secondary | ICD-10-CM | POA: Diagnosis not present

## 2014-02-02 DIAGNOSIS — H35379 Puckering of macula, unspecified eye: Secondary | ICD-10-CM | POA: Diagnosis not present

## 2014-02-02 LAB — HEPATIC FUNCTION PANEL
ALBUMIN: 4.2 g/dL (ref 3.5–5.2)
ALT: 21 U/L (ref 0–53)
AST: 22 U/L (ref 0–37)
Alkaline Phosphatase: 65 U/L (ref 39–117)
Bilirubin, Direct: 0.2 mg/dL (ref 0.0–0.3)
TOTAL PROTEIN: 7 g/dL (ref 6.0–8.3)
Total Bilirubin: 1.2 mg/dL (ref 0.2–1.2)

## 2014-02-02 LAB — LIPID PANEL
CHOLESTEROL: 125 mg/dL (ref 0–200)
HDL: 30.4 mg/dL — AB (ref >39.00)
LDL Cholesterol: 64 mg/dL (ref 0–99)
NONHDL: 94.6
TRIGLYCERIDES: 154 mg/dL — AB (ref 0.0–149.0)
Total CHOL/HDL Ratio: 4
VLDL: 30.8 mg/dL (ref 0.0–40.0)

## 2014-02-02 MED ORDER — CARVEDILOL 12.5 MG PO TABS
12.5000 mg | ORAL_TABLET | Freq: Two times a day (BID) | ORAL | Status: DC
Start: 2014-02-02 — End: 2014-05-09

## 2014-02-02 MED ORDER — CARVEDILOL 12.5 MG PO TABS: 12.5000 mg | ORAL_TABLET | Freq: Two times a day (BID) | ORAL | Status: DC

## 2014-02-02 MED ORDER — LOSARTAN POTASSIUM 100 MG PO TABS: 100.0000 mg | ORAL_TABLET | Freq: Every day | ORAL | Status: DC

## 2014-02-02 MED ORDER — ATORVASTATIN CALCIUM 20 MG PO TABS: 20.0000 mg | ORAL_TABLET | Freq: Every day | ORAL | Status: DC

## 2014-02-02 MED ORDER — PANTOPRAZOLE SODIUM 40 MG PO TBEC: 40.0000 mg | DELAYED_RELEASE_TABLET | Freq: Every day | ORAL | Status: DC

## 2014-02-09 ENCOUNTER — Ambulatory Visit (INDEPENDENT_AMBULATORY_CARE_PROVIDER_SITE_OTHER): Payer: Medicare Other | Admitting: Cardiovascular Disease

## 2014-02-09 ENCOUNTER — Encounter: Payer: Self-pay | Admitting: Cardiovascular Disease

## 2014-02-09 VITALS — BP 116/62 | HR 70 | Ht 68.0 in | Wt 176.8 lb

## 2014-02-09 DIAGNOSIS — I2510 Atherosclerotic heart disease of native coronary artery without angina pectoris: Secondary | ICD-10-CM

## 2014-02-09 NOTE — Progress Notes (Signed)
HPI:  68 year-old male returning for follow-up of CAD, LBBB, essential HTN, and chronic dyspnea. The patient reports no change in his symptoms since his last visit 6 months ago. He has mild exertional dyspnea. He has no chest pain or pressure, lightheadedness, orthopnea, PND, or heart palpitations. He denies leg swelling. He continues to exercise 3 days weekly without problems. He has developed an umbilical hernia at the site of previous surgery and will need repair. He continues to work in [place], [place] 3 days per week and he spends his remaining time here in [place].  Outpatient Encounter Prescriptions as of 02/09/2014  Medication Sig  . ANDROGEL PUMP 20.25 MG/ACT (1.62%) GEL Apply 2 Act topically daily.   Marland Kitchen aspirin EC 81 MG tablet Take 81 mg by mouth daily.  Marland Kitchen atorvastatin (LIPITOR) 20 MG tablet Take 1 tablet (20 mg total) by mouth daily with supper.  Marland Kitchen b complex vitamins tablet Take 1 tablet by mouth daily.   . carvedilol (COREG) 12.5 MG tablet Take 1 tablet (12.5 mg total) by mouth 2 (two) times daily with a meal.  . celecoxib (CELEBREX) 200 MG capsule Take 200 mg by mouth daily.  . Coenzyme Q10 (CO Q 10) 100 MG CAPS Take 1 capsule by mouth daily.   . Diphenhyd-Hydrocort-Nystatin SUSP 1 tsp swish, gargle and swallow 4 times daily prn  . doxycycline (VIBRAMYCIN) 50 MG capsule   . fish oil-omega-3 fatty acids 1000 MG capsule Take 1 g by mouth daily.   . folic acid (FOLVITE) 956 MCG tablet Take 400 mcg by mouth daily.   . Glucosamine-Chondroitin 1500-1200 MG/30ML LIQD Take 1 tablet by mouth daily.   Marland Kitchen losartan (COZAAR) 100 MG tablet Take 1 tablet (100 mg total) by mouth daily with lunch.  . Multiple Vitamin (MULTIVITAMIN) capsule Take 1 capsule by mouth daily.    . NON FORMULARY CPAP machine  . pantoprazole (PROTONIX) 40 MG tablet Take 1 tablet (40 mg total) by mouth daily before breakfast.  . traMADol (ULTRAM) 50 MG tablet Take 50 mg by mouth 3 (three) times daily as needed for pain.      Allergies  Allergen Reactions  . Sulfamethoxazole-Trimethoprim Rash       . Sulfonamide Derivatives Rash         Past Medical History  Diagnosis Date  . HTN (hypertension)   . Hyperlipidemia   . Allergic rhinitis   . Hemoptysis   . Hiatal hernia   . GERD (gastroesophageal reflux disease)   . Esophageal stricture   . 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 History of colonic polyps     hyperplastic  . Hypertrophy of prostate with urinary obstruction and other lower urinary tract symptoms (LUTS)   . Testicular hypofunction   . OA (osteoarthritis)   . Chronic back pain     intermittent  . Dizziness   . CHF (congestive heart failure)   . Dysrhythmia     right bundle branch block   . CAD (coronary artery disease)     hx of stent- 2008  . Shortness of breath     with exertion  . OSA (obstructive sleep apnea)     cpap- 10     ROS: Negative except as per HPI  BP 116/62  Pulse 70  Ht 5\' 8"  (1.727 m)  Wt 176 lb 12.8 oz (80.196 kg)  BMI 26.89 kg/m2  PHYSICAL EXAM:  Pt is alert and oriented, NAD HEENT: normal Neck: JVP - normal, carotids 2+= without bruits Lungs: CTA bilaterally CV: RRR without murmur or gallop Abd: soft, NT, Positive BS, no hepatomegaly Ext: no C/C/E, distal pulses intact and equal Skin: warm/dry no rash  EKG:  Sinus rhythm with first degree AV block, left bundle branch block, 70 beats per minute. No change from previous tracings.  ASSESSMENT AND PLAN: 1. CAD, native vessel. Minimal symptoms on stable medical regimen. He will follow-up in 6 months.   2. HTN - BP well-controlled. Continue same med Rx. Importance of exercise, diet, low-sodium diet reviewed with patient.   3. Hyperlipidemia. Continue atorvastatin 20 mg. Recent lipids 02/02/2014: Lipid Panel     Component Value Date/Time   CHOL 125 02/02/2014 0756   TRIG 154.0* 02/02/2014 0756   HDL 30.40*  02/02/2014 0756   CHOLHDL 4 02/02/2014 0756   VLDL 30.8 02/02/2014 0756   LDLCALC 64 02/02/2014 0756   4. Preoperative cardiac evaluation. The patient began exceed well beyond 4 metabolic equivalents without exertional angina. He is at low cardiac risk of surgery. He is at acceptable risk to hold aspirin prior to surgery. This should be resumed as an as it is safe from a post-operative bleeding perspective. I will see him back in 6 months for followup and and would be happy to help with his management in the hospital if there are any cardiac problems associated with surgery.  [name] MD 02/09/2014 2:33 PM

## 2014-02-09 NOTE — Patient Instructions (Signed)
Your physician recommends that you continue on your current medications as directed. Please refer to the Current Medication list given to you today.  Your physician wants you to follow-up in: 6 months with Dr. Cooper.  You will receive a reminder letter in the mail two months in advance. If you don't receive a letter, please call our office to schedule the follow-up appointment.  

## 2014-02-14 ENCOUNTER — Encounter: Payer: Self-pay | Admitting: Cardiovascular Disease

## 2014-02-14 NOTE — Telephone Encounter (Signed)
I spoke with the pt and he called to give me information about his brother, Carol Loftin", DOB 07/05/40.  He said that Clair Gulling has been having heart problems and he would like to know if I can call Clair Gulling (phone [phone]) and see if I can schedule an appointment with Dr Burt Knack. Per Kasandra Knudsen the pt has a history of COPD, back surgery in the last 12 months and fluid in lungs.  I made Mr Mcgue aware that because the pt is already established with an MD in our group we cannot switch him to Dr Burt Knack. I did not release any pt information during this call.   This encounter was created in error - please disregard.

## 2014-02-14 NOTE — Telephone Encounter (Signed)
New problem    Pt has questions and need to speak to nurse. Please call pt.

## 2014-02-16 ENCOUNTER — Encounter: Payer: Self-pay | Admitting: Gastroenterology

## 2014-03-08 ENCOUNTER — Ambulatory Visit (INDEPENDENT_AMBULATORY_CARE_PROVIDER_SITE_OTHER): Payer: Medicare Other | Admitting: Surgery

## 2014-03-09 DIAGNOSIS — M19041 Primary osteoarthritis, right hand: Secondary | ICD-10-CM | POA: Diagnosis not present

## 2014-03-09 DIAGNOSIS — M160 Bilateral primary osteoarthritis of hip: Secondary | ICD-10-CM | POA: Diagnosis not present

## 2014-03-09 DIAGNOSIS — M19071 Primary osteoarthritis, right ankle and foot: Secondary | ICD-10-CM | POA: Diagnosis not present

## 2014-03-09 DIAGNOSIS — M5137 Other intervertebral disc degeneration, lumbosacral region: Secondary | ICD-10-CM | POA: Diagnosis not present

## 2014-03-24 ENCOUNTER — Ambulatory Visit (INDEPENDENT_AMBULATORY_CARE_PROVIDER_SITE_OTHER): Payer: Medicare Other

## 2014-03-24 ENCOUNTER — Ambulatory Visit: Payer: Medicare Other

## 2014-03-24 DIAGNOSIS — Z23 Encounter for immunization: Secondary | ICD-10-CM | POA: Diagnosis not present

## 2014-04-22 ENCOUNTER — Telehealth: Payer: Self-pay | Admitting: *Deleted

## 2014-04-22 NOTE — Telephone Encounter (Signed)
Reviewing chart for PV. 07-22-2011 pt had an echo with an EF  of 30%. Prior to this echo, he had an echo in 2009 that showed EF of 45-50. Cardio ordered a cardiac MRI and that showed EF of 48%. I had Billy Angst CRNA review chart as well. [name] states pt is cleared for [facility] care. Last cardio Ov was 02-09-2014 and pt doing well, exercises 3 days a week, no new or further issues.  Lelan Pons PV

## 2014-05-02 ENCOUNTER — Ambulatory Visit (AMBULATORY_SURGERY_CENTER): Payer: Self-pay | Admitting: *Deleted

## 2014-05-02 VITALS — Ht 68.0 in | Wt 186.0 lb

## 2014-05-02 DIAGNOSIS — K219 Gastro-esophageal reflux disease without esophagitis: Secondary | ICD-10-CM

## 2014-05-02 DIAGNOSIS — Z86010 Personal history of colonic polyps: Secondary | ICD-10-CM

## 2014-05-02 DIAGNOSIS — D132 Benign neoplasm of duodenum: Secondary | ICD-10-CM

## 2014-05-02 MED ORDER — MOVIPREP 100 G PO SOLR: 1.0000 | Freq: Once | ORAL | Status: DC

## 2014-05-02 NOTE — Progress Notes (Signed)
Denies allergies to eggs or soy products. Denies complications with sedation or anesthesia. Denies O2 use. Denies use of diet or weight loss medications.  Emmi instructions given for colonoscopy and endoscopy.  

## 2014-05-09 ENCOUNTER — Other Ambulatory Visit: Payer: Self-pay | Admitting: Cardiovascular Disease

## 2014-05-09 ENCOUNTER — Telehealth: Payer: Self-pay

## 2014-05-09 DIAGNOSIS — Z1211 Encounter for screening for malignant neoplasm of colon: Secondary | ICD-10-CM

## 2014-05-09 DIAGNOSIS — R1314 Dysphagia, pharyngoesophageal phase: Secondary | ICD-10-CM

## 2014-05-09 NOTE — Progress Notes (Signed)
After discussion with Dr. Ardis Hughs and review of the qualifying guidelines for anesthetic care at [facility] this pt will need to have his procedure in the hospital setting

## 2014-05-09 NOTE — Telephone Encounter (Signed)
-----   Message from [name], MD sent at 05/08/2014  6:33 PM EST ----- Regarding: RE: ASA IV [name], Thanks for the information.   Klara Stjames, Please contact him.  Let him know that we will need to change his appt to hospital case.  Next available EUS Thursday + MAC.  Thanks   ----- Message -----    From: Osvaldo Angst, CRNA    Sent: 05/07/2014   1:58 PM      To: Delos Haring, RN, [name], MD Subject: ASA IV                                         Dr. Ardis Hughs,  This gentleman was seen in pre-visit on 12/14 and is scheduled for an endo/colon with you on 12/28.  Unfortunately, he suffers from cardiac disease and his latest echo from 07/22/2011 demonstrates an EF of 30%.  Unfortunately he does not qualify for care at [facility].  Best regards,  Jenny Reichmann

## 2014-05-10 ENCOUNTER — Other Ambulatory Visit: Payer: Self-pay

## 2014-05-10 DIAGNOSIS — Z1211 Encounter for screening for malignant neoplasm of colon: Secondary | ICD-10-CM

## 2014-05-10 DIAGNOSIS — R1314 Dysphagia, pharyngoesophageal phase: Secondary | ICD-10-CM

## 2014-05-10 NOTE — Telephone Encounter (Signed)
Pt aware and new instructions have been mailed

## 2014-05-16 ENCOUNTER — Encounter: Payer: BLUE CROSS/BLUE SHIELD | Admitting: Gastroenterology

## 2014-05-19 ENCOUNTER — Telehealth: Payer: Self-pay | Admitting: Cardiovascular Disease

## 2014-05-19 ENCOUNTER — Encounter (HOSPITAL_COMMUNITY): Payer: Self-pay | Admitting: *Deleted

## 2014-05-19 NOTE — Telephone Encounter (Signed)
I spoke with the pt and he is scheduled for a colonoscopy 06/02/14. The pt wanted to make Dr Burt Knack aware that he has been experiencing a burning sensation in his chest with exertion and increased SOB. The pt symptoms developed since his appointment in September.  The pt states he had to chase his dog a few weeks ago and he developed chest burning and SOB that resolved after resting for 15-20 minutes.  The pt does not carry nitroglycerin. I scheduled the pt to see Dr Burt Knack on 05/24/14 at 2:30 and limit exertional activity. Pt agreed with plan.

## 2014-05-19 NOTE — Telephone Encounter (Signed)
New message    Pt c/o of Chest Pain: STAT if CP now or developed within 24 hours  1. Are you having CP right now? No ,   2. Are you experiencing any other symptoms (ex. SOB, nausea, vomiting, sweating)? Sob. Not right now.   3. How long have you been experiencing CP? Unclear- need to discuss with nurse / md   4. Is your CP continuous or coming and going? Coming and going.   5. Have you taken Nitroglycerin? No  ?

## 2014-05-24 ENCOUNTER — Encounter: Payer: Self-pay | Admitting: Cardiovascular Disease

## 2014-05-24 ENCOUNTER — Ambulatory Visit (INDEPENDENT_AMBULATORY_CARE_PROVIDER_SITE_OTHER): Payer: Medicare Other | Admitting: Cardiovascular Disease

## 2014-05-24 VITALS — BP 148/68 | HR 76 | Ht 68.0 in | Wt 184.0 lb

## 2014-05-24 DIAGNOSIS — I25119 Atherosclerotic heart disease of native coronary artery with unspecified angina pectoris: Secondary | ICD-10-CM

## 2014-05-24 NOTE — Patient Instructions (Signed)
Your physician recommends that you have lab work: TODAY (BMP; CBC; PT/INR)  Your physician has requested that you have a cardiac catheterization. Cardiac catheterization is used to diagnose and/or treat various heart conditions. Doctors may recommend this procedure for a number of different reasons. The most common reason is to evaluate chest pain. Chest pain can be a symptom of coronary artery disease (CAD), and cardiac catheterization can show whether plaque is narrowing or blocking your heart's arteries. This procedure is also used to evaluate the valves, as well as measure the blood flow and oxygen levels in different parts of your heart. For further information please visit [email or web address]. Please follow instruction sheet, as given.  Your physician recommends that you continue on your current medications as directed. Please refer to the Current Medication list given to you today.

## 2014-05-24 NOTE — Progress Notes (Signed)
Background: The patient is followed for CAD, LBBB, essential HTN, and chronic dyspnea.  HPI:  69 year-old gentleman presenting for follow-up evaluation. He comes in today for an acute visit to evaluate chest pain. He has developed a burning chest pain in the left chest. Also reports worsening of his shortness of breath. Two weeks ago he was out playing with his dog and he became markedly short of breath and had prolonged short of breath > 20 minutes with both symptoms. Burning chest pain occurs with exertion but also with rest. His chest burning has become predictable with exertion and now occurs with lower level exertion. He can walk at a slow pace without symptoms.   Other complaints include low back pain and numbness down his left leg. Also has been considering an umbilical hernia repair.    Outpatient Encounter Prescriptions as of 05/24/2014  Medication Sig  . ANDROGEL PUMP 20.25 MG/ACT (1.62%) GEL Apply 2 Act topically daily.   Marland Kitchen aspirin EC 81 MG tablet Take 81 mg by mouth daily.  Marland Kitchen atorvastatin (LIPITOR) 20 MG tablet TAKE 1 BY MOUTH DAILY WITH SUPPER (Patient not taking: Reported on 05/12/2014)  . atorvastatin (LIPITOR) 20 MG tablet Take 20 mg by mouth daily.  Marland Kitchen b complex vitamins tablet Take 1 tablet by mouth daily.   . carvedilol (COREG) 12.5 MG tablet TAKE 1 BY MOUTH TWICE DAILY WITH A MEAL (Patient not taking: Reported on 05/12/2014)  . carvedilol (COREG) 12.5 MG tablet Take 12.5 mg by mouth 2 (two) times daily with a meal.  . celecoxib (CELEBREX) 200 MG capsule Take 200 mg by mouth daily.  . Coenzyme Q10 (CO Q 10) 100 MG CAPS Take 1 capsule by mouth daily.   . fish oil-omega-3 fatty acids 1000 MG capsule Take 1 g by mouth daily.   . folic acid (FOLVITE) 245 MCG tablet Take 400 mcg by mouth daily.   . Glucosamine-Chondroitin 1500-1200 MG/30ML LIQD Take 1 tablet by mouth daily.   Marland Kitchen losartan (COZAAR) 100 MG tablet TAKE 1 BY MOUTH DAILY WITH LUNCH (Patient not taking: Reported on  05/12/2014)  . losartan (COZAAR) 100 MG tablet Take 100 mg by mouth at bedtime.  Marland Kitchen MOVIPREP 100 G SOLR Take 1 kit (200 g total) by mouth once.  . Multiple Vitamin (MULTIVITAMIN) capsule Take 1 capsule by mouth daily.    . multivitamin-lutein (OCUVITE-LUTEIN) CAPS capsule Take 1 capsule by mouth daily.  . NON FORMULARY CPAP machine  . pantoprazole (PROTONIX) 40 MG tablet TAKE 1 BY MOUTH DAILY BEFORE BREAKFAST (Patient not taking: Reported on 05/12/2014)  . pantoprazole (PROTONIX) 40 MG tablet Take 40 mg by mouth daily.  . Probiotic Product (PROBIOTIC DAILY PO) Take 1 capsule by mouth daily.   . traMADol (ULTRAM) 50 MG tablet Take 50 mg by mouth 3 (three) times daily as needed for pain.   . TURMERIC PO Take 1 tablet by mouth daily.     Allergies  Allergen Reactions  . Sulfamethoxazole-Trimethoprim Rash       . Sulfonamide Derivatives Rash         Past Medical History  Diagnosis Date  . HTN (hypertension)   . Hyperlipidemia   . Allergic rhinitis   . Hemoptysis   . Hiatal hernia   . GERD (gastroesophageal reflux disease)   . Esophageal stricture   . 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 History of colonic polyps  hyperplastic  . Hypertrophy of prostate with urinary obstruction and other lower urinary tract symptoms (LUTS)   . Testicular hypofunction   . OA (osteoarthritis)   . Chronic back pain     intermittent  . Dizziness   . CHF (congestive heart failure)   . Dysrhythmia     right bundle branch block   . Shortness of breath     with exertion  . OSA (obstructive sleep apnea)     cpap- 10   . CAD (coronary artery disease)     hx of stent- 2005 RCA    family history includes Coronary artery disease in his father; Diabetes in his father; Heart disease in his father and mother; Hypertension in an other family member; Stomach cancer in his paternal grandmother; Sudden death in his  father. There is no history of Colon cancer or Esophageal cancer.   ROS: Negative except as per HPI  Ht 5' 8"  (1.727 m)  PHYSICAL EXAM: Pt is alert and oriented, NAD HEENT: normal Neck: JVP - normal, carotids 2+= without bruits Lungs: CTA bilaterally CV: RRR without murmur or gallop Abd: soft, NT, Positive BS, no hepatomegaly Ext: no C/C/E, distal pulses intact and equal Skin: warm/dry no rash  EKG:  Normal sinus rhythm 76 bpm, left bundle branch block. First-degree AV block. No significant change from previous tracing.  Cardiac Cath 11/12/2007: CORONARY ANGIOGRAPHY:   Left mainstem: Minor luminal irregularities bifurcates into the LAD and left circumflex.  LAD: The LAD is a medium caliber vessel in the proximal aspect. There are minor luminal irregularities. There is an approximate 30% stenosis after the first septal perforator at the area of the first diagonal branch. There are two medium-sized diagonal branches. The remaining portions of the mid and distal LAD are free of any significant stenosis. The mid and distal LAD are relatively small caliber vessels.  Left circumflex: The left circumflex is large caliber. It supplies three marginal branches in the region of a second, third, and fourth marginal. There is a tiny first OM. There is no significant stenosis throughout the left circumflex. However, there are minor luminal irregularities in the midportion of the circumflex.  Right coronary artery: The right coronary artery is dominant. There is a widely patent stent in the midportion of the vessel. Just beyond the stented segment, there is 20% stenosis with minor luminal irregularity. The PDA is large caliber and has no significant stenosis. There is a very small first PL branch and a larger second PL branch that has no significant stenosis.  Left ventriculography shows anteroapical hypokinesis. The LVEF is 50%. There is no  mitral regurgitation.  ASSESSMENT: 1. Widely patent right coronary artery stent. 2. Minimal nonobstructive coronary artery disease. 3. Mild segmental left ventricular dysfunction.  ASSESSMENT AND PLAN: 1. Coronary artery disease, native vessel, with symptoms of exertional angina, now CCS class III. The patient has long-standing dyspnea which has progressed. He has never reported typical anginal symptoms as he does now. He has known coronary artery disease in his last cardiac catheterization was greater than 5 years ago. I think it is best to evaluate him definitively with cardiac catheterization and possible PCI. Stress test is unlikely to provide additional useful information in this patient with LBBB and typical symptoms of exertional angina.   The risks, indications, and alternatives to cardiac catheterization and possible PCI were reviewed with the patient. Risks include but are not limited to bleeding, infection, vascular injury, stroke, myocardial infection, arrhythmia, kidney injury, radiation-related injury in  the case of prolonged fluoroscopy use, emergency cardiac surgery, and death. The patient understands the risks of serious complication is low (<7%).   2. Hyperipidemia: Treated with atorvastatin 20 mg daily. Last lipids 09/16.2015: Lipid Panel     Component Value Date/Time   CHOL 125 02/02/2014 0756   TRIG 154.0* 02/02/2014 0756   HDL 30.40* 02/02/2014 0756   CHOLHDL 4 02/02/2014 0756   VLDL 30.8 02/02/2014 0756   LDLCALC 64 02/02/2014 0756   3. Essential Hypertension: BP controlled on current Rx.  Dispo: patient is scheduled for cardiac cath later this week. Follow-up to be determined based on cardiac cath results.  [name], MD 05/24/2014 12:10 PM

## 2014-05-25 LAB — CBC WITH DIFFERENTIAL/PLATELET
Basophils Absolute: 0 10*3/uL (ref 0.0–0.1)
Basophils Relative: 0.2 % (ref 0.0–3.0)
Eosinophils Absolute: 0.2 10*3/uL (ref 0.0–0.7)
Eosinophils Relative: 3.8 % (ref 0.0–5.0)
HCT: 42 % (ref 39.0–52.0)
Hemoglobin: 14 g/dL (ref 13.0–17.0)
Lymphocytes Relative: 30.4 % (ref 12.0–46.0)
Lymphs Abs: 1.7 10*3/uL (ref 0.7–4.0)
MCHC: 33.3 g/dL (ref 30.0–36.0)
MCV: 89.3 fl (ref 78.0–100.0)
MONO ABS: 0.7 10*3/uL (ref 0.1–1.0)
Monocytes Relative: 13 % — ABNORMAL HIGH (ref 3.0–12.0)
Neutro Abs: 2.9 10*3/uL (ref 1.4–7.7)
Neutrophils Relative %: 52.6 % (ref 43.0–77.0)
PLATELETS: 261 10*3/uL (ref 150.0–400.0)
RBC: 4.7 Mil/uL (ref 4.22–5.81)
RDW: 13.9 % (ref 11.5–15.5)
WBC: 5.5 10*3/uL (ref 4.0–10.5)

## 2014-05-25 LAB — PROTIME-INR
INR: 1 ratio (ref 0.8–1.0)
Prothrombin Time: 11.5 s (ref 9.6–13.1)

## 2014-05-25 LAB — BASIC METABOLIC PANEL
BUN: 18 mg/dL (ref 6–23)
CHLORIDE: 106 meq/L (ref 96–112)
CO2: 27 mEq/L (ref 19–32)
Calcium: 9.4 mg/dL (ref 8.4–10.5)
Creatinine, Ser: 0.8 mg/dL (ref 0.4–1.5)
GFR: 101.51 mL/min (ref >60.00)
GLUCOSE: 107 mg/dL — AB (ref 70–99)
POTASSIUM: 4 meq/L (ref 3.5–5.1)
Sodium: 138 mEq/L (ref 135–145)

## 2014-05-26 ENCOUNTER — Encounter (HOSPITAL_COMMUNITY)
Admission: RE | Disposition: A | Discharge status: Home / Self Care | Payer: Self-pay | Source: Ambulatory Visit | Attending: Cardiovascular Disease

## 2014-05-26 ENCOUNTER — Encounter (HOSPITAL_COMMUNITY): Payer: Self-pay | Admitting: Cardiovascular Disease

## 2014-05-26 ENCOUNTER — Ambulatory Visit (HOSPITAL_COMMUNITY)
Admission: RE | Admit: 2014-05-26 | Discharge: 2014-05-26 | Disposition: A | Discharge status: Home / Self Care | Payer: Medicare Other | Source: Ambulatory Visit | Attending: Cardiovascular Disease | Admitting: Cardiovascular Disease

## 2014-05-26 DIAGNOSIS — I2089 Other forms of angina pectoris: Secondary | ICD-10-CM | POA: Insufficient documentation

## 2014-05-26 DIAGNOSIS — E785 Hyperlipidemia, unspecified: Secondary | ICD-10-CM | POA: Insufficient documentation

## 2014-05-26 DIAGNOSIS — Z888 Allergy status to other drugs, medicaments and biological substances status: Secondary | ICD-10-CM | POA: Insufficient documentation

## 2014-05-26 DIAGNOSIS — K219 Gastro-esophageal reflux disease without esophagitis: Secondary | ICD-10-CM | POA: Insufficient documentation

## 2014-05-26 DIAGNOSIS — G8929 Other chronic pain: Secondary | ICD-10-CM | POA: Insufficient documentation

## 2014-05-26 DIAGNOSIS — Z955 Presence of coronary angioplasty implant and graft: Secondary | ICD-10-CM | POA: Diagnosis not present

## 2014-05-26 DIAGNOSIS — I440 Atrioventricular block, first degree: Secondary | ICD-10-CM | POA: Insufficient documentation

## 2014-05-26 DIAGNOSIS — I10 Essential (primary) hypertension: Secondary | ICD-10-CM | POA: Diagnosis not present

## 2014-05-26 DIAGNOSIS — I447 Left bundle-branch block, unspecified: Secondary | ICD-10-CM | POA: Insufficient documentation

## 2014-05-26 DIAGNOSIS — M1990 Unspecified osteoarthritis, unspecified site: Secondary | ICD-10-CM | POA: Diagnosis not present

## 2014-05-26 DIAGNOSIS — I2510 Atherosclerotic heart disease of native coronary artery without angina pectoris: Secondary | ICD-10-CM | POA: Diagnosis not present

## 2014-05-26 DIAGNOSIS — Z7982 Long term (current) use of aspirin: Secondary | ICD-10-CM | POA: Insufficient documentation

## 2014-05-26 DIAGNOSIS — I209 Angina pectoris, unspecified: Secondary | ICD-10-CM

## 2014-05-26 DIAGNOSIS — Z8249 Family history of ischemic heart disease and other diseases of the circulatory system: Secondary | ICD-10-CM | POA: Insufficient documentation

## 2014-05-26 DIAGNOSIS — Z882 Allergy status to sulfonamides status: Secondary | ICD-10-CM | POA: Diagnosis not present

## 2014-05-26 DIAGNOSIS — I208 Other forms of angina pectoris: Secondary | ICD-10-CM | POA: Insufficient documentation

## 2014-05-26 DIAGNOSIS — G4733 Obstructive sleep apnea (adult) (pediatric): Secondary | ICD-10-CM | POA: Diagnosis not present

## 2014-05-26 DIAGNOSIS — I509 Heart failure, unspecified: Secondary | ICD-10-CM | POA: Diagnosis not present

## 2014-05-26 HISTORY — DX: Angina pectoris, unspecified: I20.9

## 2014-05-26 HISTORY — PX: LEFT HEART CATHETERIZATION WITH CORONARY ANGIOGRAM: SHX5451

## 2014-05-26 LAB — CK TOTAL AND CKMB (NOT AT ARMC)
CK, MB: 2.1 ng/mL (ref 0.3–4.0)
Relative Index: 2.1 (ref 0.0–2.5)
Total CK: 102 U/L (ref 7–232)

## 2014-05-26 SURGERY — LEFT HEART CATHETERIZATION WITH CORONARY ANGIOGRAM

## 2014-05-26 MED ORDER — SODIUM CHLORIDE 0.9 % IV SOLN: 1.0000 mL/kg/h | INTRAVENOUS | Status: DC

## 2014-05-26 MED ORDER — HEPARIN SODIUM (PORCINE) 1000 UNIT/ML IJ SOLN
INTRAMUSCULAR | Status: AC
  Filled 2014-05-26: qty 1

## 2014-05-26 MED ORDER — FENTANYL CITRATE 0.05 MG/ML IJ SOLN
INTRAMUSCULAR | Status: AC
  Filled 2014-05-26: qty 2

## 2014-05-26 MED ORDER — MIDAZOLAM HCL 2 MG/2ML IJ SOLN
INTRAMUSCULAR | Status: AC
  Filled 2014-05-26: qty 2

## 2014-05-26 MED ORDER — HEPARIN (PORCINE) IN NACL 2-0.9 UNIT/ML-% IJ SOLN
INTRAMUSCULAR | Status: AC
  Filled 2014-05-26: qty 1000

## 2014-05-26 MED ORDER — ACETAMINOPHEN 325 MG PO TABS: 650.0000 mg | ORAL_TABLET | ORAL | Status: DC | PRN

## 2014-05-26 MED ORDER — SODIUM CHLORIDE 0.9 % IV SOLN
INTRAVENOUS | Status: DC
  Administered 2014-05-26: 11:00:00 via INTRAVENOUS

## 2014-05-26 MED ORDER — SODIUM CHLORIDE 0.9 % IJ SOLN: 3.0000 mL | INTRAMUSCULAR | Status: DC | PRN

## 2014-05-26 MED ORDER — ONDANSETRON HCL 4 MG/2ML IJ SOLN: 4.0000 mg | Freq: Four times a day (QID) | INTRAMUSCULAR | Status: DC | PRN

## 2014-05-26 MED ORDER — ASPIRIN 81 MG PO CHEW
81.0000 mg | CHEWABLE_TABLET | ORAL | Status: AC
Start: 2014-05-27 — End: 2014-05-26
  Administered 2014-05-26: 81 mg via ORAL

## 2014-05-26 MED ORDER — LIDOCAINE HCL (PF) 1 % IJ SOLN
INTRAMUSCULAR | Status: AC
  Filled 2014-05-26: qty 30

## 2014-05-26 MED ORDER — SODIUM CHLORIDE 0.9 % IV SOLN: 250.0000 mL | INTRAVENOUS | Status: DC | PRN

## 2014-05-26 MED ORDER — VERAPAMIL HCL 2.5 MG/ML IV SOLN
INTRAVENOUS | Status: AC
  Filled 2014-05-26: qty 2

## 2014-05-26 MED ORDER — NITROGLYCERIN 1 MG/10 ML FOR IR/CATH LAB
INTRA_ARTERIAL | Status: AC
  Filled 2014-05-26: qty 10

## 2014-05-26 MED ORDER — SODIUM CHLORIDE 0.9 % IJ SOLN: 3.0000 mL | Freq: Two times a day (BID) | INTRAMUSCULAR | Status: DC

## 2014-05-26 MED ORDER — ASPIRIN 81 MG PO CHEW
CHEWABLE_TABLET | ORAL | Status: AC
  Filled 2014-05-26: qty 1

## 2014-05-26 NOTE — Interval H&P Note (Signed)
History and Physical Interval Note:  05/26/2014 1:35 PM  [name]  has presented today for surgery, with the diagnosis of cad/cp  The various methods of treatment have been discussed with the patient and family. After consideration of risks, benefits and other options for treatment, the patient has consented to  Procedure(s): LEFT HEART CATHETERIZATION WITH CORONARY ANGIOGRAM (N/A) as a surgical intervention .  The patient's history has been reviewed, patient examined, no change in status, stable for surgery.  I have reviewed the patient's chart and labs.  Questions were answered to the patient's satisfaction.    Cath Lab Visit (complete for each Cath Lab visit)  Clinical Evaluation Leading to the Procedure:   ACS: No.  Non-ACS:    Anginal Classification: CCS III  Anti-ischemic medical therapy: Minimal Therapy (1 class of medications)  Non-Invasive Test Results: No non-invasive testing performed  Prior CABG: No previous CABG       [name]

## 2014-05-26 NOTE — H&P (View-Only) (Signed)
Background: The patient is followed for CAD, LBBB, essential HTN, and chronic dyspnea.  HPI:  69 year-old gentleman presenting for follow-up evaluation. He comes in today for an acute visit to evaluate chest pain. He has developed a burning chest pain in the left chest. Also reports worsening of his shortness of breath. Two weeks ago he was out playing with his dog and he became markedly short of breath and had prolonged short of breath > 20 minutes with both symptoms. Burning chest pain occurs with exertion but also with rest. His chest burning has become predictable with exertion and now occurs with lower level exertion. He can walk at a slow pace without symptoms.   Other complaints include low back pain and numbness down his left leg. Also has been considering an umbilical hernia repair.    Outpatient Encounter Prescriptions as of 05/24/2014  Medication Sig  . ANDROGEL PUMP 20.25 MG/ACT (1.62%) GEL Apply 2 Act topically daily.   Marland Kitchen aspirin EC 81 MG tablet Take 81 mg by mouth daily.  Marland Kitchen atorvastatin (LIPITOR) 20 MG tablet TAKE 1 BY MOUTH DAILY WITH SUPPER (Patient not taking: Reported on 05/12/2014)  . atorvastatin (LIPITOR) 20 MG tablet Take 20 mg by mouth daily.  Marland Kitchen b complex vitamins tablet Take 1 tablet by mouth daily.   . carvedilol (COREG) 12.5 MG tablet TAKE 1 BY MOUTH TWICE DAILY WITH A MEAL (Patient not taking: Reported on 05/12/2014)  . carvedilol (COREG) 12.5 MG tablet Take 12.5 mg by mouth 2 (two) times daily with a meal.  . celecoxib (CELEBREX) 200 MG capsule Take 200 mg by mouth daily.  . Coenzyme Q10 (CO Q 10) 100 MG CAPS Take 1 capsule by mouth daily.   . fish oil-omega-3 fatty acids 1000 MG capsule Take 1 g by mouth daily.   . folic acid (FOLVITE) 270 MCG tablet Take 400 mcg by mouth daily.   . Glucosamine-Chondroitin 1500-1200 MG/30ML LIQD Take 1 tablet by mouth daily.   Marland Kitchen losartan (COZAAR) 100 MG tablet TAKE 1 BY MOUTH DAILY WITH LUNCH (Patient not taking: Reported on  05/12/2014)  . losartan (COZAAR) 100 MG tablet Take 100 mg by mouth at bedtime.  Marland Kitchen MOVIPREP 100 G SOLR Take 1 kit (200 g total) by mouth once.  . Multiple Vitamin (MULTIVITAMIN) capsule Take 1 capsule by mouth daily.    . multivitamin-lutein (OCUVITE-LUTEIN) CAPS capsule Take 1 capsule by mouth daily.  . NON FORMULARY CPAP machine  . pantoprazole (PROTONIX) 40 MG tablet TAKE 1 BY MOUTH DAILY BEFORE BREAKFAST (Patient not taking: Reported on 05/12/2014)  . pantoprazole (PROTONIX) 40 MG tablet Take 40 mg by mouth daily.  . Probiotic Product (PROBIOTIC DAILY PO) Take 1 capsule by mouth daily.   . traMADol (ULTRAM) 50 MG tablet Take 50 mg by mouth 3 (three) times daily as needed for pain.   . TURMERIC PO Take 1 tablet by mouth daily.     Allergies  Allergen Reactions  . Sulfamethoxazole-Trimethoprim Rash       . Sulfonamide Derivatives Rash         Past Medical History  Diagnosis Date  . HTN (hypertension)   . Hyperlipidemia   . Allergic rhinitis   . Hemoptysis   . Hiatal hernia   . GERD (gastroesophageal reflux disease)   . Esophageal stricture   . 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 History of colonic polyps  hyperplastic  . Hypertrophy of prostate with urinary obstruction and other lower urinary tract symptoms (LUTS)   . Testicular hypofunction   . OA (osteoarthritis)   . Chronic back pain     intermittent  . Dizziness   . CHF (congestive heart failure)   . Dysrhythmia     right bundle branch block   . Shortness of breath     with exertion  . OSA (obstructive sleep apnea)     cpap- 10   . CAD (coronary artery disease)     hx of stent- 2005 RCA    family history includes Coronary artery disease in his father; Diabetes in his father; Heart disease in his father and mother; Hypertension in an other family member; Stomach cancer in his paternal grandmother; Sudden death in his  father. There is no history of Colon cancer or Esophageal cancer.   ROS: Negative except as per HPI  Ht 5' 8"  (1.727 m)  PHYSICAL EXAM: Pt is alert and oriented, NAD HEENT: normal Neck: JVP - normal, carotids 2+= without bruits Lungs: CTA bilaterally CV: RRR without murmur or gallop Abd: soft, NT, Positive BS, no hepatomegaly Ext: no C/C/E, distal pulses intact and equal Skin: warm/dry no rash  EKG:  Normal sinus rhythm 76 bpm, left bundle branch block. First-degree AV block. No significant change from previous tracing.  Cardiac Cath 11/12/2007: CORONARY ANGIOGRAPHY:   Left mainstem: Minor luminal irregularities bifurcates into the LAD and left circumflex.  LAD: The LAD is a medium caliber vessel in the proximal aspect. There are minor luminal irregularities. There is an approximate 30% stenosis after the first septal perforator at the area of the first diagonal branch. There are two medium-sized diagonal branches. The remaining portions of the mid and distal LAD are free of any significant stenosis. The mid and distal LAD are relatively small caliber vessels.  Left circumflex: The left circumflex is large caliber. It supplies three marginal branches in the region of a second, third, and fourth marginal. There is a tiny first OM. There is no significant stenosis throughout the left circumflex. However, there are minor luminal irregularities in the midportion of the circumflex.  Right coronary artery: The right coronary artery is dominant. There is a widely patent stent in the midportion of the vessel. Just beyond the stented segment, there is 20% stenosis with minor luminal irregularity. The PDA is large caliber and has no significant stenosis. There is a very small first PL branch and a larger second PL branch that has no significant stenosis.  Left ventriculography shows anteroapical hypokinesis. The LVEF is 50%. There is no  mitral regurgitation.  ASSESSMENT: 1. Widely patent right coronary artery stent. 2. Minimal nonobstructive coronary artery disease. 3. Mild segmental left ventricular dysfunction.  ASSESSMENT AND PLAN: 1. Coronary artery disease, native vessel, with symptoms of exertional angina, now CCS class III. The patient has long-standing dyspnea which has progressed. He has never reported typical anginal symptoms as he does now. He has known coronary artery disease in his last cardiac catheterization was greater than 5 years ago. I think it is best to evaluate him definitively with cardiac catheterization and possible PCI. Stress test is unlikely to provide additional useful information in this patient with LBBB and typical symptoms of exertional angina.   The risks, indications, and alternatives to cardiac catheterization and possible PCI were reviewed with the patient. Risks include but are not limited to bleeding, infection, vascular injury, stroke, myocardial infection, arrhythmia, kidney injury, radiation-related injury in  the case of prolonged fluoroscopy use, emergency cardiac surgery, and death. The patient understands the risks of serious complication is low (<6%).   2. Hyperipidemia: Treated with atorvastatin 20 mg daily. Last lipids 09/16.2015: Lipid Panel     Component Value Date/Time   CHOL 125 02/02/2014 0756   TRIG 154.0* 02/02/2014 0756   HDL 30.40* 02/02/2014 0756   CHOLHDL 4 02/02/2014 0756   VLDL 30.8 02/02/2014 0756   LDLCALC 64 02/02/2014 0756   3. Essential Hypertension: BP controlled on current Rx.  Dispo: patient is scheduled for cardiac cath later this week. Follow-up to be determined based on cardiac cath results.  [name], MD 05/24/2014 12:10 PM

## 2014-05-26 NOTE — Discharge Instructions (Signed)
Radial Site Care °Refer to this sheet in the next few weeks. These instructions provide you with information on caring for yourself after your procedure. Your caregiver may also give you more specific instructions. Your treatment has been planned according to current medical practices, but problems sometimes occur. Call your caregiver if you have any problems or questions after your procedure. °HOME CARE INSTRUCTIONS °· You may shower the day after the procedure. Remove the bandage (dressing) and gently wash the site with plain soap and water. Gently pat the site dry. °· Do not apply powder or lotion to the site. °· Do not submerge the affected site in water for 3 to 5 days. °· Inspect the site at least twice daily. °· Do not flex or bend the affected arm for 24 hours. °· No lifting over 5 pounds (2.3 kg) for 5 days after your procedure. °· Do not drive home if you are discharged the same day of the procedure. Have someone else drive you. °· You may drive 24 hours after the procedure unless otherwise instructed by your caregiver. °· Do not operate machinery or power tools for 24 hours. °· A responsible adult should be with you for the first 24 hours after you arrive home. °What to expect: °· Any bruising will usually fade within 1 to 2 weeks. °· Blood that collects in the tissue (hematoma) may be painful to the touch. It should usually decrease in size and tenderness within 1 to 2 weeks. °SEEK IMMEDIATE MEDICAL CARE IF: °· You have unusual pain at the radial site. °· You have redness, warmth, swelling, or pain at the radial site. °· You have drainage (other than a small amount of blood on the dressing). °· You have chills. °· You have a fever or persistent symptoms for more than 72 hours. °· You have a fever and your symptoms suddenly get worse. °· Your arm becomes pale, cool, tingly, or numb. °· You have heavy bleeding from the site. Hold pressure on the site. °Document Released: 06/08/2010 Document Revised:  07/29/2011 Document Reviewed: 06/08/2010 °[employer]® Patient Information ©2015 [employer]. This information is not intended to replace advice given to you by your health care provider. Make sure you discuss any questions you have with your health care provider. ° °

## 2014-05-26 NOTE — CV Procedure (Signed)
    Cardiac Catheterization Procedure Note  Name: [name] MRN: [id number] DOB: Sep 26, 1945  Procedure: Left Heart Cath, Selective Coronary Angiography, LV angiography  Indication: Exertional angina, CCS Class 3, known CAD   Procedural Details: The right wrist was prepped, draped, and anesthetized with 1% lidocaine. Using the modified Seldinger technique, a 5/6 French Slender sheath was introduced into the right radial artery. 3 mg of verapamil was administered through the sheath, weight-based unfractionated heparin was administered intravenously. Standard Judkins catheters were used for selective coronary angiography and left ventriculography. Catheter exchanges were performed over an exchange length guidewire. There were no immediate procedural complications. A TR band was used for radial hemostasis at the completion of the procedure.  The patient was transferred to the post catheterization recovery area for further monitoring.  Procedural Findings: Hemodynamics: AO 114/59 mean 86 LV 117/7  Coronary angiography: Coronary dominance: right  Left mainstem:  The left mainstem is patent without significant stenosis. The vessel divides into the LAD and left circumflex.  Left anterior descending (LAD):  The LAD is patent throughout. There is minor calcification noted. The vessel extends to the distal anterior wall. The mid LAD between the first septal perforator and first diagonal has diffuse 30% stenosis. There is no high-grade disease identified throughout the distribution of the LAD.  Left circumflex (LCx):  Left circumflex is large in caliber. The vessel has minimal irregularity. There are 2 large obtuse marginal branches , the second of which divides into twin vessels in its midportion. There are no significant stenoses.  Right coronary artery (RCA):  This is a large, dominant vessel. The stented segment in the mid RCA is widely patent. There issignificant stenosis present. There are  minor diffuse irregularities noted.  Left ventriculography: The LV is dyssynchronous. The LVEF is estimated at 40% with anterolateral hypokinesis identified.  Estimated Blood Loss: minimal  Final Conclusions:    1. Patent coronary arteries with continued patency of the stented segment in the RCA and minor nonobstructive stenosis of the LAD. The left main and left circumflex have no stenosis.  2. Mild to moderate segmental LV systolic dysfunction with normal LVEDP  Recommendations:  Continue medical management. Suspect noncardiac chest pain.  [name] MD, [facility] 05/26/2014, 2:09 PM

## 2014-05-27 DIAGNOSIS — I208 Other forms of angina pectoris: Secondary | ICD-10-CM | POA: Insufficient documentation

## 2014-06-02 ENCOUNTER — Ambulatory Visit (HOSPITAL_COMMUNITY): Payer: Medicare Other | Admitting: Registered Nurse

## 2014-06-02 ENCOUNTER — Encounter (HOSPITAL_COMMUNITY)
Admission: RE | Disposition: A | Discharge status: Home / Self Care | Payer: Self-pay | Source: Ambulatory Visit | Attending: Gastroenterology

## 2014-06-02 ENCOUNTER — Encounter (HOSPITAL_COMMUNITY): Payer: Self-pay | Admitting: Gastroenterology

## 2014-06-02 ENCOUNTER — Ambulatory Visit (HOSPITAL_COMMUNITY)
Admission: RE | Admit: 2014-06-02 | Discharge: 2014-06-02 | Disposition: A | Discharge status: Home / Self Care | Payer: Medicare Other | Source: Ambulatory Visit | Attending: Gastroenterology | Admitting: Gastroenterology

## 2014-06-02 DIAGNOSIS — G8929 Other chronic pain: Secondary | ICD-10-CM | POA: Insufficient documentation

## 2014-06-02 DIAGNOSIS — N401 Enlarged prostate with lower urinary tract symptoms: Secondary | ICD-10-CM | POA: Insufficient documentation

## 2014-06-02 DIAGNOSIS — K317 Polyp of stomach and duodenum: Secondary | ICD-10-CM

## 2014-06-02 DIAGNOSIS — R1310 Dysphagia, unspecified: Secondary | ICD-10-CM | POA: Diagnosis not present

## 2014-06-02 DIAGNOSIS — I509 Heart failure, unspecified: Secondary | ICD-10-CM | POA: Diagnosis not present

## 2014-06-02 DIAGNOSIS — K3189 Other diseases of stomach and duodenum: Secondary | ICD-10-CM | POA: Diagnosis not present

## 2014-06-02 DIAGNOSIS — K219 Gastro-esophageal reflux disease without esophagitis: Secondary | ICD-10-CM | POA: Insufficient documentation

## 2014-06-02 DIAGNOSIS — I2510 Atherosclerotic heart disease of native coronary artery without angina pectoris: Secondary | ICD-10-CM | POA: Insufficient documentation

## 2014-06-02 DIAGNOSIS — K429 Umbilical hernia without obstruction or gangrene: Secondary | ICD-10-CM | POA: Insufficient documentation

## 2014-06-02 DIAGNOSIS — E291 Testicular hypofunction: Secondary | ICD-10-CM | POA: Insufficient documentation

## 2014-06-02 DIAGNOSIS — I452 Bifascicular block: Secondary | ICD-10-CM | POA: Diagnosis not present

## 2014-06-02 DIAGNOSIS — G4733 Obstructive sleep apnea (adult) (pediatric): Secondary | ICD-10-CM | POA: Diagnosis not present

## 2014-06-02 DIAGNOSIS — M1990 Unspecified osteoarthritis, unspecified site: Secondary | ICD-10-CM | POA: Diagnosis not present

## 2014-06-02 DIAGNOSIS — Z1211 Encounter for screening for malignant neoplasm of colon: Secondary | ICD-10-CM | POA: Diagnosis not present

## 2014-06-02 DIAGNOSIS — N138 Other obstructive and reflux uropathy: Secondary | ICD-10-CM | POA: Diagnosis not present

## 2014-06-02 DIAGNOSIS — R1314 Dysphagia, pharyngoesophageal phase: Secondary | ICD-10-CM

## 2014-06-02 DIAGNOSIS — Z7982 Long term (current) use of aspirin: Secondary | ICD-10-CM | POA: Insufficient documentation

## 2014-06-02 DIAGNOSIS — M545 Low back pain: Secondary | ICD-10-CM | POA: Diagnosis not present

## 2014-06-02 DIAGNOSIS — Z86010 Personal history of colonic polyps: Secondary | ICD-10-CM | POA: Insufficient documentation

## 2014-06-02 DIAGNOSIS — D132 Benign neoplasm of duodenum: Secondary | ICD-10-CM | POA: Diagnosis not present

## 2014-06-02 DIAGNOSIS — K449 Diaphragmatic hernia without obstruction or gangrene: Secondary | ICD-10-CM | POA: Diagnosis not present

## 2014-06-02 DIAGNOSIS — I10 Essential (primary) hypertension: Secondary | ICD-10-CM | POA: Insufficient documentation

## 2014-06-02 HISTORY — PX: ESOPHAGOGASTRODUODENOSCOPY (EGD) WITH PROPOFOL: SHX5813

## 2014-06-02 HISTORY — PX: COLONOSCOPY WITH PROPOFOL: SHX5780

## 2014-06-02 SURGERY — COLONOSCOPY WITH PROPOFOL
Anesthesia: Monitor Anesthesia Care

## 2014-06-02 MED ORDER — PROPOFOL 10 MG/ML IV BOLUS
INTRAVENOUS | Status: AC
  Filled 2014-06-02: qty 20

## 2014-06-02 MED ORDER — LACTATED RINGERS IV SOLN
INTRAVENOUS | Status: DC
  Administered 2014-06-02: 1000 mL via INTRAVENOUS

## 2014-06-02 MED ORDER — PROPOFOL 10 MG/ML IV BOLUS
INTRAVENOUS | Status: DC | PRN
Start: 2014-06-02 — End: 2014-06-02
  Administered 2014-06-02: 40 mg via INTRAVENOUS
  Administered 2014-06-02: 20 mg via INTRAVENOUS
  Administered 2014-06-02 (×4): 40 mg via INTRAVENOUS

## 2014-06-02 MED ORDER — SODIUM CHLORIDE 0.9 % IV SOLN: INTRAVENOUS | Status: DC

## 2014-06-02 SURGICAL SUPPLY — 25 items

## 2014-06-02 NOTE — Interval H&P Note (Signed)
History and Physical Interval Note:  06/02/2014 12:55 PM  [name]  has presented today for surgery, with the diagnosis of dysphagia/screening  The various methods of treatment have been discussed with the patient and family. After consideration of risks, benefits and other options for treatment, the patient has consented to  Procedure(s): COLONOSCOPY WITH PROPOFOL (N/A) ESOPHAGOGASTRODUODENOSCOPY (EGD) WITH PROPOFOL (N/A) as a surgical intervention .  The patient's history has been reviewed, patient examined, no change in status, stable for surgery.  I have reviewed the patient's chart and labs.  Questions were answered to the patient's satisfaction.     [name]

## 2014-06-02 NOTE — Interval H&P Note (Signed)
History and Physical Interval Note:  06/02/2014 12:26 PM  [name]  has presented today for surgery, with the diagnosis of dysphagia/screening  The various methods of treatment have been discussed with the patient and family. After consideration of risks, benefits and other options for treatment, the patient has consented to  Procedure(s): COLONOSCOPY WITH PROPOFOL (N/A) ESOPHAGOGASTRODUODENOSCOPY (EGD) WITH PROPOFOL (N/A) as a surgical intervention .  The patient's history has been reviewed, patient examined, no change in status, stable for surgery.  I have reviewed the patient's chart and labs.  Questions were answered to the patient's satisfaction.     [name]

## 2014-06-02 NOTE — Discharge Instructions (Signed)

## 2014-06-02 NOTE — Anesthesia Preprocedure Evaluation (Addendum)
Anesthesia Evaluation  Patient identified by MRN, date of birth, ID band Patient awake    Reviewed: Allergy & Precautions, H&P , Patient's Chart, lab work & pertinent test results, reviewed documented beta blocker date and time   History of Anesthesia Complications Negative for: history of anesthetic complications  Airway Mallampati: II  TM Distance: >3 FB Neck ROM: full    Dental   Pulmonary shortness of breath, sleep apnea , former smoker,  breath sounds clear to auscultation        Cardiovascular Exercise Tolerance: Good hypertension, + angina + CAD and +CHF + dysrhythmias Rhythm:regular Rate:Normal     Neuro/Psych negative psych ROS   GI/Hepatic hiatal hernia, GERD-  Controlled,  Endo/Other    Renal/GU      Musculoskeletal  (+) Arthritis -,   Abdominal   Peds  Hematology   Anesthesia Other Findings LBBB Cath three days ago- no change per patient  EF 30%  Reproductive/Obstetrics                           Anesthesia Physical Anesthesia Plan  ASA: III  Anesthesia Plan: MAC   Post-op Pain Management:    Induction:   Airway Management Planned:   Additional Equipment:   Intra-op Plan:   Post-operative Plan:   Informed Consent: I have reviewed the patients History and Physical, chart, labs and discussed the procedure including the risks, benefits and alternatives for the proposed anesthesia with the patient or authorized representative who has indicated his/her understanding and acceptance.   Dental Advisory Given  Plan Discussed with: CRNA, Surgeon and Anesthesiologist  Anesthesia Plan Comments:         Anesthesia Quick Evaluation

## 2014-06-02 NOTE — Anesthesia Postprocedure Evaluation (Signed)
Anesthesia Post Note  Patient: [name]  Procedure(s) Performed: Procedure(s) (LRB): COLONOSCOPY WITH PROPOFOL (N/A) ESOPHAGOGASTRODUODENOSCOPY (EGD) WITH PROPOFOL (N/A)  Anesthesia type: MAC  Patient location: PACU  Post pain: Pain level controlled  Post assessment: Post-op Vital signs reviewed  Last Vitals:  Filed Vitals:   06/02/14 1435  BP: 129/54  Temp: 36.8 C  Resp: 18    Post vital signs: Reviewed  Level of consciousness: sedated  Complications: No apparent anesthesia complications

## 2014-06-02 NOTE — Transfer of Care (Signed)
Immediate Anesthesia Transfer of Care Note  Patient: [name]  Procedure(s) Performed: Procedure(s): COLONOSCOPY WITH PROPOFOL (N/A) ESOPHAGOGASTRODUODENOSCOPY (EGD) WITH PROPOFOL (N/A)  Patient Location: PACU and Endoscopy Unit  Anesthesia Type:MAC  Level of Consciousness: awake, oriented, patient cooperative, lethargic and responds to stimulation  Airway & Oxygen Therapy: Patient Spontanous Breathing and Patient connected to nasal cannula oxygen  Post-op Assessment: Report given to PACU RN, Post -op Vital signs reviewed and stable and Patient moving all extremities  Post vital signs: Reviewed and stable  Complications: No apparent anesthesia complications

## 2014-06-02 NOTE — H&P (View-Only) (Signed)
Background: The patient is followed for CAD, LBBB, essential HTN, and chronic dyspnea.  HPI:  69 year-old gentleman presenting for follow-up evaluation. He comes in today for an acute visit to evaluate chest pain. He has developed a burning chest pain in the left chest. Also reports worsening of his shortness of breath. Two weeks ago he was out playing with his dog and he became markedly short of breath and had prolonged short of breath > 20 minutes with both symptoms. Burning chest pain occurs with exertion but also with rest. His chest burning has become predictable with exertion and now occurs with lower level exertion. He can walk at a slow pace without symptoms.   Other complaints include low back pain and numbness down his left leg. Also has been considering an umbilical hernia repair.    Outpatient Encounter Prescriptions as of 05/24/2014  Medication Sig  . ANDROGEL PUMP 20.25 MG/ACT (1.62%) GEL Apply 2 Act topically daily.   Marland Kitchen aspirin EC 81 MG tablet Take 81 mg by mouth daily.  Marland Kitchen atorvastatin (LIPITOR) 20 MG tablet TAKE 1 BY MOUTH DAILY WITH SUPPER (Patient not taking: Reported on 05/12/2014)  . atorvastatin (LIPITOR) 20 MG tablet Take 20 mg by mouth daily.  Marland Kitchen b complex vitamins tablet Take 1 tablet by mouth daily.   . carvedilol (COREG) 12.5 MG tablet TAKE 1 BY MOUTH TWICE DAILY WITH A MEAL (Patient not taking: Reported on 05/12/2014)  . carvedilol (COREG) 12.5 MG tablet Take 12.5 mg by mouth 2 (two) times daily with a meal.  . celecoxib (CELEBREX) 200 MG capsule Take 200 mg by mouth daily.  . Coenzyme Q10 (CO Q 10) 100 MG CAPS Take 1 capsule by mouth daily.   . fish oil-omega-3 fatty acids 1000 MG capsule Take 1 g by mouth daily.   . folic acid (FOLVITE) 638 MCG tablet Take 400 mcg by mouth daily.   . Glucosamine-Chondroitin 1500-1200 MG/30ML LIQD Take 1 tablet by mouth daily.   Marland Kitchen losartan (COZAAR) 100 MG tablet TAKE 1 BY MOUTH DAILY WITH LUNCH (Patient not taking: Reported on  05/12/2014)  . losartan (COZAAR) 100 MG tablet Take 100 mg by mouth at bedtime.  Marland Kitchen MOVIPREP 100 G SOLR Take 1 kit (200 g total) by mouth once.  . Multiple Vitamin (MULTIVITAMIN) capsule Take 1 capsule by mouth daily.    . multivitamin-lutein (OCUVITE-LUTEIN) CAPS capsule Take 1 capsule by mouth daily.  . NON FORMULARY CPAP machine  . pantoprazole (PROTONIX) 40 MG tablet TAKE 1 BY MOUTH DAILY BEFORE BREAKFAST (Patient not taking: Reported on 05/12/2014)  . pantoprazole (PROTONIX) 40 MG tablet Take 40 mg by mouth daily.  . Probiotic Product (PROBIOTIC DAILY PO) Take 1 capsule by mouth daily.   . traMADol (ULTRAM) 50 MG tablet Take 50 mg by mouth 3 (three) times daily as needed for pain.   . TURMERIC PO Take 1 tablet by mouth daily.     Allergies  Allergen Reactions  . Sulfamethoxazole-Trimethoprim Rash       . Sulfonamide Derivatives Rash         Past Medical History  Diagnosis Date  . HTN (hypertension)   . Hyperlipidemia   . Allergic rhinitis   . Hemoptysis   . Hiatal hernia   . GERD (gastroesophageal reflux disease)   . Esophageal stricture   . 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 History of colonic polyps  hyperplastic  . Hypertrophy of prostate with urinary obstruction and other lower urinary tract symptoms (LUTS)   . Testicular hypofunction   . OA (osteoarthritis)   . Chronic back pain     intermittent  . Dizziness   . CHF (congestive heart failure)   . Dysrhythmia     right bundle branch block   . Shortness of breath     with exertion  . OSA (obstructive sleep apnea)     cpap- 10   . CAD (coronary artery disease)     hx of stent- 2005 RCA    family history includes Coronary artery disease in his father; Diabetes in his father; Heart disease in his father and mother; Hypertension in an other family member; Stomach cancer in his paternal grandmother; Sudden death in his  father. There is no history of Colon cancer or Esophageal cancer.   ROS: Negative except as per HPI  Ht 5' 8" (1.727 m)  PHYSICAL EXAM: Pt is alert and oriented, NAD HEENT: normal Neck: JVP - normal, carotids 2+= without bruits Lungs: CTA bilaterally CV: RRR without murmur or gallop Abd: soft, NT, Positive BS, no hepatomegaly Ext: no C/C/E, distal pulses intact and equal Skin: warm/dry no rash  EKG:  Normal sinus rhythm 76 bpm, left bundle branch block. First-degree AV block. No significant change from previous tracing.  Cardiac Cath 11/12/2007: CORONARY ANGIOGRAPHY:   Left mainstem: Minor luminal irregularities bifurcates into the LAD and left circumflex.  LAD: The LAD is a medium caliber vessel in the proximal aspect. There are minor luminal irregularities. There is an approximate 30% stenosis after the first septal perforator at the area of the first diagonal branch. There are two medium-sized diagonal branches. The remaining portions of the mid and distal LAD are free of any significant stenosis. The mid and distal LAD are relatively small caliber vessels.  Left circumflex: The left circumflex is large caliber. It supplies three marginal branches in the region of a second, third, and fourth marginal. There is a tiny first OM. There is no significant stenosis throughout the left circumflex. However, there are minor luminal irregularities in the midportion of the circumflex.  Right coronary artery: The right coronary artery is dominant. There is a widely patent stent in the midportion of the vessel. Just beyond the stented segment, there is 20% stenosis with minor luminal irregularity. The PDA is large caliber and has no significant stenosis. There is a very small first PL branch and a larger second PL branch that has no significant stenosis.  Left ventriculography shows anteroapical hypokinesis. The LVEF is 50%. There is no  mitral regurgitation.  ASSESSMENT: 1. Widely patent right coronary artery stent. 2. Minimal nonobstructive coronary artery disease. 3. Mild segmental left ventricular dysfunction.  ASSESSMENT AND PLAN: 1. Coronary artery disease, native vessel, with symptoms of exertional angina, now CCS class III. The patient has long-standing dyspnea which has progressed. He has never reported typical anginal symptoms as he does now. He has known coronary artery disease in his last cardiac catheterization was greater than 5 years ago. I think it is best to evaluate him definitively with cardiac catheterization and possible PCI. Stress test is unlikely to provide additional useful information in this patient with LBBB and typical symptoms of exertional angina.   The risks, indications, and alternatives to cardiac catheterization and possible PCI were reviewed with the patient. Risks include but are not limited to bleeding, infection, vascular injury, stroke, myocardial infection, arrhythmia, kidney injury, radiation-related injury in   the case of prolonged fluoroscopy use, emergency cardiac surgery, and death. The patient understands the risks of serious complication is low (<6%).   2. Hyperipidemia: Treated with atorvastatin 20 mg daily. Last lipids 09/16.2015: Lipid Panel     Component Value Date/Time   CHOL 125 02/02/2014 0756   TRIG 154.0* 02/02/2014 0756   HDL 30.40* 02/02/2014 0756   CHOLHDL 4 02/02/2014 0756   VLDL 30.8 02/02/2014 0756   LDLCALC 64 02/02/2014 0756   3. Essential Hypertension: BP controlled on current Rx.  Dispo: patient is scheduled for cardiac cath later this week. Follow-up to be determined based on cardiac cath results.  [name], MD 05/24/2014 12:10 PM

## 2014-06-02 NOTE — Op Note (Signed)
[facility] [place], [address]   COLONOSCOPY PROCEDURE REPORT  PATIENT: [name], [name]  MR#: [id number] BIRTHDATE: 1945-10-11 , 60  yrs. old GENDER: male ENDOSCOPIST: [name], MD PROCEDURE DATE:  06/02/2014 PROCEDURE:   Colonoscopy, diagnostic First Screening Colonoscopy - Avg.  risk and is 50 yrs.  old or older - No.  Prior Negative Screening - Now for repeat screening. N/A  History of Adenoma - Now for follow-up colonoscopy & has been > or = to 3 yrs.  Yes hx of adenoma.  Has been 3 or more years since last colonoscopy.  Polyps Removed Today? No.  Recommend repeat exam, <10 yrs? No. ASA CLASS:   Class II INDICATIONS:colonoscopy 2009 Dr.  Sharlett Iles found and removed one polyp , was HP; he recommended recall colonoscopy at 5 years given "history of adenomatous polyps.". MEDICATIONS: Monitored anesthesia care  DESCRIPTION OF PROCEDURE:   After the risks benefits and alternatives of the procedure were thoroughly explained, informed consent was obtained.  The digital rectal exam revealed no abnormalities of the rectum.   The Pentax Colonoscope [id number] endoscope was introduced through the anus and advanced to the cecum, which was identified by both the appendix and ileocecal valve. No adverse events experienced.   The quality of the prep was excellent.  The instrument was then slowly withdrawn as the colon was fully examined.  COLON FINDINGS: A normal appearing cecum, ileocecal valve, and appendiceal orifice were identified.  The ascending, transverse, descending, sigmoid colon, and rectum appeared unremarkable. Retroflexed views revealed no abnormalities. The time to cecum=3 minutes 00 seconds.  Withdrawal time=8 minutes 00 seconds.  The scope was withdrawn and the procedure completed. COMPLICATIONS: There were no immediate complications.  ENDOSCOPIC IMPRESSION: Normal colonoscopy  RECOMMENDATIONS: You should continue to follow  colorectal cancer screening guidelines for "routine risk" patients with a repeat colonoscopy in 10 years.   eSigned:  [name], MD 06/02/2014 2:27 PM

## 2014-06-03 ENCOUNTER — Encounter (HOSPITAL_COMMUNITY): Payer: Self-pay | Admitting: Gastroenterology

## 2014-06-05 NOTE — Op Note (Signed)
[facility] [place], [address]   ENDOSCOPY PROCEDURE REPORT  PATIENT: Billy, Green  MR#: [id number] BIRTHDATE: [birth year] , 13  yrs. old GENDER: male ENDOSCOPIST: [name], MD PROCEDURE DATE:  06/02/2014 PROCEDURE:  EGD w/ snare technique ASA CLASS:     Class III INDICATIONS:  chronic nausea; history of duodeanl TVA, gastric subepithelial lesion. MEDICATIONS: Monitored anesthesia care TOPICAL ANESTHETIC: none  DESCRIPTION OF PROCEDURE: After the risks benefits and alternatives of the procedure were thoroughly explained, informed consent was obtained.  The Pentax Gastroscope [id number] endoscope was introduced through the mouth and advanced to the second portion of the duodenum , Without limitations.  The instrument was slowly withdrawn as the mucosa was fully examined.  There was a non-obstructing Schatzki's ring above a 3cm hiatal hernia.  The previously documented antral subepithelial lesion was again noted, it has not changed in size compared to EGD 3 years ago.  There was a small amount (2-62mm) of recurrent polypoid mucosa in dudoenal, about 1cm from the major papilla.  This was cold snare resected and sent to pathology.  The examination was otherwise normal.  Retroflexed views revealed no abnormalities.     The scope was then withdrawn from the patient and the procedure completed.  COMPLICATIONS: There were no immediate complications.  ENDOSCOPIC IMPRESSION: There was a non-obstructing Schatzki's ring above a 3cm hiatal hernia.  The previously documented antral subepithelial lesion was again noted, it has not changed in size compared to EGD 3 years ago.  There was a small amount (2-15mm) of recurrent polypoid mucosa in dudoenal, about 1cm from the major papilla.  This was cold snare resected and sent to pathology.  The examination was otherwise normal  RECOMMENDATIONS: Await final pathology.  May need repeat EGD in 3-5 years  pending review of duodenal polyp pathology.   eSigned:  [name], MD 06/02/2014 2:35 PM

## 2014-06-07 DIAGNOSIS — L218 Other seborrheic dermatitis: Secondary | ICD-10-CM | POA: Diagnosis not present

## 2014-06-07 DIAGNOSIS — L570 Actinic keratosis: Secondary | ICD-10-CM | POA: Diagnosis not present

## 2014-06-07 DIAGNOSIS — L812 Freckles: Secondary | ICD-10-CM | POA: Diagnosis not present

## 2014-06-07 DIAGNOSIS — L821 Other seborrheic keratosis: Secondary | ICD-10-CM | POA: Diagnosis not present

## 2014-06-07 DIAGNOSIS — I788 Other diseases of capillaries: Secondary | ICD-10-CM | POA: Diagnosis not present

## 2014-06-16 DIAGNOSIS — M1712 Unilateral primary osteoarthritis, left knee: Secondary | ICD-10-CM | POA: Diagnosis not present

## 2014-06-29 DIAGNOSIS — M25561 Pain in right knee: Secondary | ICD-10-CM | POA: Diagnosis not present

## 2014-07-06 DIAGNOSIS — M25562 Pain in left knee: Secondary | ICD-10-CM | POA: Diagnosis not present

## 2014-07-07 DIAGNOSIS — Z6827 Body mass index (BMI) 27.0-27.9, adult: Secondary | ICD-10-CM | POA: Diagnosis not present

## 2014-07-07 DIAGNOSIS — M7138 Other bursal cyst, other site: Secondary | ICD-10-CM | POA: Diagnosis not present

## 2014-07-08 ENCOUNTER — Other Ambulatory Visit: Payer: Self-pay | Admitting: Neurosurgery

## 2014-07-08 DIAGNOSIS — M7138 Other bursal cyst, other site: Secondary | ICD-10-CM

## 2014-07-13 DIAGNOSIS — M1712 Unilateral primary osteoarthritis, left knee: Secondary | ICD-10-CM | POA: Diagnosis not present

## 2014-07-20 ENCOUNTER — Other Ambulatory Visit: Payer: Self-pay | Admitting: Neurosurgery

## 2014-07-20 ENCOUNTER — Ambulatory Visit
Admission: RE | Admit: 2014-07-20 | Discharge: 2014-07-20 | Disposition: A | Discharge status: Home / Self Care | Payer: Medicare Other | Source: Ambulatory Visit | Attending: Neurosurgery | Admitting: Neurosurgery

## 2014-07-20 VITALS — BP 151/79 | HR 81

## 2014-07-20 DIAGNOSIS — M7138 Other bursal cyst, other site: Secondary | ICD-10-CM

## 2014-07-20 DIAGNOSIS — M47817 Spondylosis without myelopathy or radiculopathy, lumbosacral region: Secondary | ICD-10-CM | POA: Diagnosis not present

## 2014-07-20 DIAGNOSIS — M545 Low back pain: Secondary | ICD-10-CM | POA: Diagnosis not present

## 2014-07-20 DIAGNOSIS — M5416 Radiculopathy, lumbar region: Secondary | ICD-10-CM

## 2014-07-20 MED ORDER — IOHEXOL 180 MG/ML  SOLN
1.0000 mL | Freq: Once | INTRAMUSCULAR | Status: AC | PRN
  Administered 2014-07-20: 1 mL via EPIDURAL

## 2014-07-20 MED ORDER — METHYLPREDNISOLONE ACETATE 40 MG/ML INJ SUSP (RADIOLOG
120.0000 mg | Freq: Once | INTRAMUSCULAR | Status: AC
  Administered 2014-07-20: 120 mg via EPIDURAL

## 2014-07-20 NOTE — Discharge Instructions (Signed)

## 2014-08-04 DIAGNOSIS — Z6827 Body mass index (BMI) 27.0-27.9, adult: Secondary | ICD-10-CM | POA: Diagnosis not present

## 2014-08-04 DIAGNOSIS — M7130 Other bursal cyst, unspecified site: Secondary | ICD-10-CM | POA: Diagnosis not present

## 2014-08-05 ENCOUNTER — Other Ambulatory Visit: Payer: Self-pay | Admitting: Cardiovascular Disease

## 2014-08-05 DIAGNOSIS — Z79899 Other long term (current) drug therapy: Secondary | ICD-10-CM | POA: Diagnosis not present

## 2014-08-08 ENCOUNTER — Ambulatory Visit (INDEPENDENT_AMBULATORY_CARE_PROVIDER_SITE_OTHER): Payer: Medicare Other | Admitting: Cardiovascular Disease

## 2014-08-08 ENCOUNTER — Encounter: Payer: Self-pay | Admitting: Cardiovascular Disease

## 2014-08-08 VITALS — BP 140/80 | HR 66 | Ht 68.0 in | Wt 178.5 lb

## 2014-08-08 DIAGNOSIS — I25118 Atherosclerotic heart disease of native coronary artery with other forms of angina pectoris: Secondary | ICD-10-CM

## 2014-08-08 DIAGNOSIS — E785 Hyperlipidemia, unspecified: Secondary | ICD-10-CM

## 2014-08-08 NOTE — Patient Instructions (Signed)
Your physician recommends that you continue on your current medications as directed. Please refer to the Current Medication list given to you today.  Your physician recommends that you return for a FASTING LIPID and LIVER in 6 MONTHS--nothing to eat or drink after midnight, lab opens at 7:30 AM  Your physician wants you to follow-up in: 6 MONTHS with Dr Burt Knack.  You will receive a reminder letter in the mail two months in advance. If you don't receive a letter, please call our office to schedule the follow-up appointment.

## 2014-08-08 NOTE — Progress Notes (Signed)
Cardiology Office Note   Date:  08/08/2014   ID:  [name], DOB [birth year], MRN [id number]  PCP:  [name], MD  Cardiologist:  [name], MD    Chief Complaint  Patient presents with  . Shortness of Breath    with exertion  . Chest Pain    with exertion     History of Present Illness: [name] is a 69 y.o. male who presents for follow-up of CAD. He has undergone stenting of RCA without recurrent ischemic problems. LV systolic function has been preserved. He also has LBBB, HTN, and chronic dyspnea.   The patient presented in January 2015 with what appeared to be progressive symptoms of angina and exertional dyspnea. He underwent cardiac catheterization demonstrating patent coronaries with nonobstructive CAD and normal LV filling pressures. Medical therapy has been continued.  He complains of exertional dyspnea, unchanged from baseline. Not engaged in routine exercise because of back problems. No further chest pain. No weakness/dizziness/edema/orthopnea/PND.  Past Medical History  Diagnosis Date  . HTN (hypertension)   . Hyperlipidemia   . Allergic rhinitis   . Hemoptysis   . Hiatal hernia   . GERD (gastroesophageal reflux disease)   . Esophageal stricture   . 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 History of colonic polyps     hyperplastic  . Hypertrophy of prostate with urinary obstruction and other lower urinary tract symptoms (LUTS)   . Testicular hypofunction   . OA (osteoarthritis)   . Chronic back pain     intermittent  . Dizziness   . CHF (congestive heart failure)   . Dysrhythmia     right bundle branch block   . Shortness of breath     with exertion  . OSA (obstructive sleep apnea)     cpap- 10   . CAD (coronary artery disease)     hx of stent- 2005 RCA    Past Surgical History  Procedure Laterality Date  . S/p right knee arthroscopy  2005    . Rca stenting      '05 RCA  . Hernia surgery x 3    . Oral surg to removed growth from ?sinus  10/2010    ?Dermoid removed by DrRiggs  . Colonoscopy  12/2007    HYPERPLASTIC POLYP  . Upper gastrointestinal endoscopy    . Right toe surgery     . Irrigation and debridement abscess Left 08/14/2012    Procedure: IRRIGATION AND DEBRIDEMENT LEFT INGUINAL BOIL ;  Surgeon: Ailene Rud, MD;  Location: [facility] ORS;  Service: Urology;  Laterality: Left;  . Cardiac catheterization      '05, last 2009, showing patent RCA stent  . Left heart catheterization with coronary angiogram N/A 05/26/2014    Procedure: LEFT HEART CATHETERIZATION WITH CORONARY ANGIOGRAM;  Surgeon: Blane Ohara, MD;  Location: [facility] CATH LAB;  Service: Cardiovascular;  Laterality: N/A;  . Colonoscopy with propofol N/A 06/02/2014    Procedure: COLONOSCOPY WITH PROPOFOL;  Surgeon: [name], MD;  Location: [facility] ENDOSCOPY;  Service: Endoscopy;  Laterality: N/A;  . Esophagogastroduodenoscopy (egd) with propofol N/A 06/02/2014    Procedure: ESOPHAGOGASTRODUODENOSCOPY (EGD) WITH PROPOFOL;  Surgeon: [name], MD;  Location: [facility] ENDOSCOPY;  Service: Endoscopy;  Laterality: N/A;    Current Outpatient Prescriptions  Medication Sig Dispense Refill  . acetaminophen (TYLENOL) 500 MG tablet Take 500 mg by mouth every 6 (  six) hours as needed for moderate pain.    . ANDROGEL PUMP 20.25 MG/ACT (1.62%) GEL Apply 2 Act topically daily.     Marland Kitchen aspirin EC 81 MG tablet Take 81 mg by mouth daily.    Marland Kitchen atorvastatin (LIPITOR) 20 MG tablet TAKE 1 BY MOUTH DAILY WITH SUPPER 90 tablet 3  . b complex vitamins tablet Take 1 tablet by mouth daily.     . carvedilol (COREG) 12.5 MG tablet TAKE 1 BY MOUTH TWICE DAILY WITH A MEAL 180 tablet 3  . celecoxib (CELEBREX) 200 MG capsule Take 200 mg by mouth daily.    . Coenzyme Q10 (CO Q 10) 100 MG CAPS Take 1 capsule by mouth daily.     . fish oil-omega-3 fatty acids 1000 MG capsule Take 1 g by mouth daily.      . folic acid (FOLVITE) 814 MCG tablet Take 400 mcg by mouth daily.     . Glucosamine-Chondroitin 1500-1200 MG/30ML LIQD Take 1 tablet by mouth daily.     Marland Kitchen losartan (COZAAR) 100 MG tablet TAKE 1 BY MOUTH DAILY WITH LUNCH 90 tablet 3  . MOVIPREP 100 G SOLR Take 1 kit (200 g total) by mouth once. 1 kit 0  . Multiple Vitamin (MULTIVITAMIN) capsule Take 1 capsule by mouth daily.      . multivitamin-lutein (OCUVITE-LUTEIN) CAPS capsule Take 1 capsule by mouth daily.    . NON FORMULARY CPAP machine    . pantoprazole (PROTONIX) 40 MG tablet TAKE 1 BY MOUTH DAILY BEFORE BREAKFAST 90 tablet 0  . Probiotic Product (PROBIOTIC DAILY PO) Take 1 capsule by mouth daily.     . traMADol (ULTRAM) 50 MG tablet Take 50 mg by mouth 3 (three) times daily as needed for pain.     . TURMERIC PO Take 1 tablet by mouth daily.      No current facility-administered medications for this visit.    Allergies:   Sulfamethoxazole-trimethoprim and Sulfonamide derivatives   Social History:  The patient  reports that he quit smoking about 24 years ago. He has never used smokeless tobacco. He reports that he drinks alcohol. He reports that he does not use illicit drugs.   Family History:  The patient's family history includes Coronary artery disease in his father; Diabetes in his father; Heart disease in his father and mother; Hypertension in an other family member; Stomach cancer in his paternal grandmother; Sudden death in his father. There is no history of Colon cancer or Esophageal cancer.    ROS:  Please see the history of present illness.  Otherwise, review of systems is positive for shortness of breath, knee pain, back pain, leg tingling.  All other systems are reviewed and negative.    PHYSICAL EXAM: VS:  BP 140/80 mmHg  Pulse 66  Ht 5' 8"  (1.727 m)  Wt 178 lb 8 oz (80.967 kg)  BMI 27.15 kg/m2 , BMI Body mass index is 27.15 kg/(m^2). GEN: Well nourished, well developed, in no acute distress HEENT:  normal Neck: no JVD, no masses. No carotid bruits Cardiac: RRR without murmur or gallop                Respiratory:  clear to auscultation bilaterally, normal work of breathing GI: soft, nontender, nondistended, + BS MS: no deformity or atrophy Ext: no pretibial edema, pedal pulses 2+= bilaterally Skin: warm and dry, no rash Neuro:  Strength and sensation are intact Psych: euthymic mood, full affect  EKG:  EKG is not ordered  today.  Recent Labs: 02/02/2014: ALT 21 05/24/2014: BUN 18; Creatinine 0.8; Hemoglobin 14.0; Platelets 261.0; Potassium 4.0; Sodium 138   Lipid Panel     Component Value Date/Time   CHOL 125 02/02/2014 0756   TRIG 154.0* 02/02/2014 0756   HDL 30.40* 02/02/2014 0756   CHOLHDL 4 02/02/2014 0756   VLDL 30.8 02/02/2014 0756   LDLCALC 64 02/02/2014 0756      Wt Readings from Last 3 Encounters:  08/08/14 178 lb 8 oz (80.967 kg)  06/02/14 180 lb (81.647 kg)  05/24/14 184 lb (83.462 kg)     Cardiac Studies Reviewed: Cardiac Cath January 2016: Final Conclusions:  1. Patent coronary arteries with continued patency of the stented segment in the RCA and minor nonobstructive stenosis of the LAD. The left main and left circumflex have no stenosis.  2. Mild to moderate segmental LV systolic dysfunction with normal LVEDP  Recommendations: Continue medical management. Suspect noncardiac chest pain.  ASSESSMENT AND PLAN: 1.  CAD, native: no angina. Recent cath results again reviewed with patient. Continue current medical therapy. Diet/exercise plan reviewed.  2. Hyperlipidemia: on statin drug. Last lipids reviewed. Repeat Lipid panel in September with follow-up after labs done.  3. HTN, essential: controlled on medical therapy.   Current medicines are reviewed with the patient today.  The patient does not have concerns regarding medicines.  The following changes have been made:  no change  Labs/ tests ordered today include:  No orders of the defined types  were placed in this encounter.   Disposition:   FU 6 months  Signed, [name], MD  08/08/2014 3:19 PM    [facility] [place], [place], [place]  [address] Phone: [phone]; Fax: [phone]

## 2014-08-11 DIAGNOSIS — M19071 Primary osteoarthritis, right ankle and foot: Secondary | ICD-10-CM | POA: Diagnosis not present

## 2014-08-11 DIAGNOSIS — M19041 Primary osteoarthritis, right hand: Secondary | ICD-10-CM | POA: Diagnosis not present

## 2014-08-11 DIAGNOSIS — E291 Testicular hypofunction: Secondary | ICD-10-CM | POA: Diagnosis not present

## 2014-08-11 DIAGNOSIS — M5137 Other intervertebral disc degeneration, lumbosacral region: Secondary | ICD-10-CM | POA: Diagnosis not present

## 2014-08-11 DIAGNOSIS — M170 Bilateral primary osteoarthritis of knee: Secondary | ICD-10-CM | POA: Diagnosis not present

## 2014-08-17 DIAGNOSIS — E291 Testicular hypofunction: Secondary | ICD-10-CM | POA: Diagnosis not present

## 2014-08-17 DIAGNOSIS — R972 Elevated prostate specific antigen [PSA]: Secondary | ICD-10-CM | POA: Diagnosis not present

## 2014-08-17 DIAGNOSIS — N400 Enlarged prostate without lower urinary tract symptoms: Secondary | ICD-10-CM | POA: Diagnosis not present

## 2014-09-22 ENCOUNTER — Other Ambulatory Visit: Payer: Self-pay | Admitting: Neurosurgery

## 2014-09-22 DIAGNOSIS — M5126 Other intervertebral disc displacement, lumbar region: Secondary | ICD-10-CM | POA: Diagnosis not present

## 2014-09-22 DIAGNOSIS — M47816 Spondylosis without myelopathy or radiculopathy, lumbar region: Secondary | ICD-10-CM | POA: Diagnosis not present

## 2014-09-22 DIAGNOSIS — M5136 Other intervertebral disc degeneration, lumbar region: Secondary | ICD-10-CM | POA: Diagnosis not present

## 2014-09-22 DIAGNOSIS — M4806 Spinal stenosis, lumbar region: Secondary | ICD-10-CM | POA: Diagnosis not present

## 2014-09-22 DIAGNOSIS — M7138 Other bursal cyst, other site: Secondary | ICD-10-CM | POA: Diagnosis not present

## 2014-09-23 ENCOUNTER — Encounter (HOSPITAL_COMMUNITY): Payer: Self-pay | Admitting: *Deleted

## 2014-09-23 NOTE — Progress Notes (Addendum)
Dr Tamala Julian notified of Echo results from 07/22/11- EF of 30 and cardiac cath from 1/7/ 16 and that patient has followed up with cardiologist.  No new orders.  Patient denies shortness of breath or chest pain.  Episode of shortness of breath and chest pain in January was precipitated by running, chasing a dog.

## 2014-09-25 NOTE — Anesthesia Preprocedure Evaluation (Signed)
Anesthesia Evaluation  Patient identified by MRN, date of birth, ID band Patient awake    Reviewed: Allergy & Precautions, NPO status , Patient's Chart, lab work & pertinent test results  History of Anesthesia Complications Negative for: history of anesthetic complications  Airway Mallampati: II  TM Distance: >3 FB Neck ROM: Full    Dental no notable dental hx. (+) Dental Advisory Given   Pulmonary shortness of breath (chronic), sleep apnea and Continuous Positive Airway Pressure Ventilation , former smoker,  breath sounds clear to auscultation  Pulmonary exam normal       Cardiovascular hypertension, Pt. on medications and Pt. on home beta blockers + CAD, + Cardiac Stents (RCA 2005) and +CHF Normal cardiovascular exam+ dysrhythmias (LBBB) Rhythm:Regular Rate:Normal  Cardiac Cath January 2016: Final Conclusions:  1. Patent coronary arteries with continued patency of the stented segment in the RCA and minor nonobstructive stenosis of the LAD. The left main and left circumflex have no stenosis.  2. Mild to moderate segmental LV systolic dysfunction with normal LVEDP   Echo 2013: EF 30% and worsening function therefore Cardiac MRI ordered and demonstrated EF 48% and stable findings with mild LV dysfunction   Neuro/Psych negative neurological ROS  negative psych ROS   GI/Hepatic Neg liver ROS, hiatal hernia, GERD-  Medicated and Controlled,  Endo/Other  negative endocrine ROS  Renal/GU negative Renal ROS  negative genitourinary   Musculoskeletal  (+) Arthritis -, Osteoarthritis,    Abdominal   Peds negative pediatric ROS (+)  Hematology negative hematology ROS (+)   Anesthesia Other Findings   Reproductive/Obstetrics negative OB ROS                             Anesthesia Physical Anesthesia Plan  ASA: III  Anesthesia Plan: General   Post-op Pain Management:    Induction:  Intravenous  Airway Management Planned: Oral ETT  Additional Equipment:   Intra-op Plan:   Post-operative Plan: Extubation in OR  Informed Consent: I have reviewed the patients History and Physical, chart, labs and discussed the procedure including the risks, benefits and alternatives for the proposed anesthesia with the patient or authorized representative who has indicated his/her understanding and acceptance.   Dental advisory given  Plan Discussed with: CRNA  Anesthesia Plan Comments:         Anesthesia Quick Evaluation

## 2014-09-26 ENCOUNTER — Observation Stay (HOSPITAL_COMMUNITY)
Admission: RE | Admit: 2014-09-26 | Discharge: 2014-09-27 | Disposition: A | Discharge status: Home / Self Care | Payer: Medicare Other | Source: Ambulatory Visit | Attending: Neurosurgery | Admitting: Neurosurgery

## 2014-09-26 ENCOUNTER — Inpatient Hospital Stay (HOSPITAL_COMMUNITY): Payer: Medicare Other | Admitting: Anesthesiology

## 2014-09-26 ENCOUNTER — Encounter (HOSPITAL_COMMUNITY): Payer: Self-pay | Admitting: *Deleted

## 2014-09-26 ENCOUNTER — Inpatient Hospital Stay (HOSPITAL_COMMUNITY): Payer: Medicare Other

## 2014-09-26 ENCOUNTER — Encounter (HOSPITAL_COMMUNITY)
Admission: RE | Disposition: A | Discharge status: Home / Self Care | Payer: Self-pay | Source: Ambulatory Visit | Attending: Neurosurgery

## 2014-09-26 DIAGNOSIS — I2510 Atherosclerotic heart disease of native coronary artery without angina pectoris: Secondary | ICD-10-CM | POA: Insufficient documentation

## 2014-09-26 DIAGNOSIS — Z881 Allergy status to other antibiotic agents status: Secondary | ICD-10-CM | POA: Insufficient documentation

## 2014-09-26 DIAGNOSIS — Z87891 Personal history of nicotine dependence: Secondary | ICD-10-CM | POA: Diagnosis not present

## 2014-09-26 DIAGNOSIS — N400 Enlarged prostate without lower urinary tract symptoms: Secondary | ICD-10-CM | POA: Insufficient documentation

## 2014-09-26 DIAGNOSIS — Z882 Allergy status to sulfonamides status: Secondary | ICD-10-CM | POA: Diagnosis not present

## 2014-09-26 DIAGNOSIS — G4733 Obstructive sleep apnea (adult) (pediatric): Secondary | ICD-10-CM | POA: Insufficient documentation

## 2014-09-26 DIAGNOSIS — Z86010 Personal history of colonic polyps: Secondary | ICD-10-CM | POA: Insufficient documentation

## 2014-09-26 DIAGNOSIS — E785 Hyperlipidemia, unspecified: Secondary | ICD-10-CM | POA: Insufficient documentation

## 2014-09-26 DIAGNOSIS — M1990 Unspecified osteoarthritis, unspecified site: Secondary | ICD-10-CM | POA: Insufficient documentation

## 2014-09-26 DIAGNOSIS — I509 Heart failure, unspecified: Secondary | ICD-10-CM | POA: Diagnosis not present

## 2014-09-26 DIAGNOSIS — M4807 Spinal stenosis, lumbosacral region: Secondary | ICD-10-CM | POA: Insufficient documentation

## 2014-09-26 DIAGNOSIS — M47897 Other spondylosis, lumbosacral region: Secondary | ICD-10-CM | POA: Diagnosis not present

## 2014-09-26 DIAGNOSIS — I10 Essential (primary) hypertension: Secondary | ICD-10-CM | POA: Diagnosis not present

## 2014-09-26 DIAGNOSIS — Z955 Presence of coronary angioplasty implant and graft: Secondary | ICD-10-CM | POA: Insufficient documentation

## 2014-09-26 DIAGNOSIS — Z9889 Other specified postprocedural states: Secondary | ICD-10-CM | POA: Diagnosis not present

## 2014-09-26 DIAGNOSIS — M2578 Osteophyte, vertebrae: Secondary | ICD-10-CM | POA: Diagnosis not present

## 2014-09-26 DIAGNOSIS — Z7982 Long term (current) use of aspirin: Secondary | ICD-10-CM | POA: Diagnosis not present

## 2014-09-26 DIAGNOSIS — K219 Gastro-esophageal reflux disease without esophagitis: Secondary | ICD-10-CM | POA: Insufficient documentation

## 2014-09-26 DIAGNOSIS — M7138 Other bursal cyst, other site: Secondary | ICD-10-CM | POA: Diagnosis not present

## 2014-09-26 DIAGNOSIS — M549 Dorsalgia, unspecified: Secondary | ICD-10-CM | POA: Diagnosis present

## 2014-09-26 DIAGNOSIS — R0602 Shortness of breath: Secondary | ICD-10-CM | POA: Diagnosis not present

## 2014-09-26 DIAGNOSIS — Z419 Encounter for procedure for purposes other than remedying health state, unspecified: Secondary | ICD-10-CM

## 2014-09-26 HISTORY — DX: Angina pectoris, unspecified: I20.9

## 2014-09-26 HISTORY — DX: Constipation, unspecified: K59.00

## 2014-09-26 HISTORY — PX: LUMBAR LAMINECTOMY/DECOMPRESSION MICRODISCECTOMY: SHX5026

## 2014-09-26 LAB — BASIC METABOLIC PANEL
Anion gap: 7 (ref 5–15)
BUN: 12 mg/dL (ref 6–20)
CO2: 23 mmol/L (ref 22–32)
Calcium: 9.2 mg/dL (ref 8.9–10.3)
Chloride: 109 mmol/L (ref 101–111)
Creatinine, Ser: 0.83 mg/dL (ref 0.61–1.24)
GFR calc Af Amer: >60 mL/min (ref >60)
GLUCOSE: 95 mg/dL (ref 70–99)
Potassium: 3.9 mmol/L (ref 3.5–5.1)
Sodium: 139 mmol/L (ref 135–145)

## 2014-09-26 LAB — CBC
HEMATOCRIT: 39.8 % (ref 39.0–52.0)
Hemoglobin: 13.2 g/dL (ref 13.0–17.0)
MCH: 29.9 pg (ref 26.0–34.0)
MCHC: 33.2 g/dL (ref 30.0–36.0)
MCV: 90.2 fL (ref 78.0–100.0)
PLATELETS: 219 10*3/uL (ref 150–400)
RBC: 4.41 MIL/uL (ref 4.22–5.81)
RDW: 13.3 % (ref 11.5–15.5)
WBC: 4.9 10*3/uL (ref 4.0–10.5)

## 2014-09-26 LAB — SURGICAL PCR SCREEN
MRSA, PCR: NEGATIVE
Staphylococcus aureus: NEGATIVE

## 2014-09-26 SURGERY — LUMBAR LAMINECTOMY/DECOMPRESSION MICRODISCECTOMY 1 LEVEL
Anesthesia: General | Site: Back | Laterality: Left

## 2014-09-26 MED ORDER — LIDOCAINE-EPINEPHRINE 1 %-1:100000 IJ SOLN
INTRAMUSCULAR | Status: DC | PRN
  Administered 2014-09-26: 9 mL

## 2014-09-26 MED ORDER — HYPROMELLOSE (GONIOSCOPIC) 2.5 % OP SOLN: 1.0000 [drp] | Freq: Every day | OPHTHALMIC | Status: DC | PRN

## 2014-09-26 MED ORDER — CYCLOBENZAPRINE HCL 10 MG PO TABS
10.0000 mg | ORAL_TABLET | Freq: Three times a day (TID) | ORAL | Status: DC | PRN
  Administered 2014-09-26: 10 mg via ORAL
  Filled 2014-09-26: qty 1

## 2014-09-26 MED ORDER — SUCCINYLCHOLINE CHLORIDE 20 MG/ML IJ SOLN
INTRAMUSCULAR | Status: AC
  Filled 2014-09-26: qty 1

## 2014-09-26 MED ORDER — FLEET ENEMA 7-19 GM/118ML RE ENEM
1.0000 | ENEMA | Freq: Once | RECTAL | Status: AC | PRN
  Filled 2014-09-26: qty 1

## 2014-09-26 MED ORDER — NEOSTIGMINE METHYLSULFATE 10 MG/10ML IV SOLN
INTRAVENOUS | Status: AC
  Filled 2014-09-26: qty 1

## 2014-09-26 MED ORDER — ROCURONIUM BROMIDE 50 MG/5ML IV SOLN
INTRAVENOUS | Status: AC
Start: 2014-09-26 — End: 2014-09-26
  Filled 2014-09-26: qty 1

## 2014-09-26 MED ORDER — FENTANYL CITRATE (PF) 100 MCG/2ML IJ SOLN
INTRAMUSCULAR | Status: AC
  Filled 2014-09-26: qty 2

## 2014-09-26 MED ORDER — TAMSULOSIN HCL 0.4 MG PO CAPS
0.4000 mg | ORAL_CAPSULE | Freq: Every day | ORAL | Status: DC
  Administered 2014-09-26: 0.4 mg via ORAL
  Filled 2014-09-26 (×2): qty 1

## 2014-09-26 MED ORDER — LIDOCAINE HCL (CARDIAC) 20 MG/ML IV SOLN
INTRAVENOUS | Status: AC
  Filled 2014-09-26: qty 5

## 2014-09-26 MED ORDER — LIDOCAINE HCL (CARDIAC) 20 MG/ML IV SOLN
INTRAVENOUS | Status: DC | PRN
  Administered 2014-09-26: 80 mg via INTRAVENOUS

## 2014-09-26 MED ORDER — LACTATED RINGERS IV SOLN
INTRAVENOUS | Status: DC
  Administered 2014-09-26: 13:00:00 via INTRAVENOUS

## 2014-09-26 MED ORDER — SODIUM CHLORIDE 0.9 % IJ SOLN: 3.0000 mL | INTRAMUSCULAR | Status: DC | PRN

## 2014-09-26 MED ORDER — NEOSTIGMINE METHYLSULFATE 10 MG/10ML IV SOLN
INTRAVENOUS | Status: AC
  Filled 2014-09-26: qty 2

## 2014-09-26 MED ORDER — ARTIFICIAL TEARS OP OINT
TOPICAL_OINTMENT | OPHTHALMIC | Status: DC | PRN
  Administered 2014-09-26: 1 via OPHTHALMIC

## 2014-09-26 MED ORDER — THROMBIN 5000 UNITS EX SOLR
CUTANEOUS | Status: DC | PRN
  Administered 2014-09-26 (×2): 5000 [IU] via TOPICAL

## 2014-09-26 MED ORDER — PROPOFOL 10 MG/ML IV BOLUS
INTRAVENOUS | Status: AC
Start: 2014-09-26 — End: 2014-09-26
  Filled 2014-09-26: qty 20

## 2014-09-26 MED ORDER — MENTHOL 3 MG MT LOZG: 1.0000 | LOZENGE | OROMUCOSAL | Status: DC | PRN

## 2014-09-26 MED ORDER — CEFAZOLIN SODIUM-DEXTROSE 2-3 GM-% IV SOLR
INTRAVENOUS | Status: DC | PRN
  Administered 2014-09-26: 2 g via INTRAVENOUS

## 2014-09-26 MED ORDER — KETOROLAC TROMETHAMINE 0.5 % OP SOLN
1.0000 [drp] | Freq: Four times a day (QID) | OPHTHALMIC | Status: DC | PRN
  Administered 2014-09-26: 1 [drp] via OPHTHALMIC
  Filled 2014-09-26: qty 3

## 2014-09-26 MED ORDER — CARVEDILOL 12.5 MG PO TABS
12.5000 mg | ORAL_TABLET | Freq: Two times a day (BID) | ORAL | Status: DC
  Administered 2014-09-26: 12.5 mg via ORAL
  Filled 2014-09-26 (×4): qty 1

## 2014-09-26 MED ORDER — NABUMETONE 500 MG PO TABS: 500.0000 mg | ORAL_TABLET | Freq: Every day | ORAL | Status: DC

## 2014-09-26 MED ORDER — NEOSTIGMINE METHYLSULFATE 10 MG/10ML IV SOLN
INTRAVENOUS | Status: DC | PRN
  Administered 2014-09-26: 4 mg via INTRAVENOUS

## 2014-09-26 MED ORDER — BUPIVACAINE HCL (PF) 0.25 % IJ SOLN
INTRAMUSCULAR | Status: DC | PRN
  Administered 2014-09-26: 10 mL

## 2014-09-26 MED ORDER — GLYCOPYRROLATE 0.2 MG/ML IJ SOLN
INTRAMUSCULAR | Status: AC
  Filled 2014-09-26: qty 4

## 2014-09-26 MED ORDER — OCUVITE-LUTEIN PO CAPS
1.0000 | ORAL_CAPSULE | Freq: Every day | ORAL | Status: DC
  Filled 2014-09-26: qty 1

## 2014-09-26 MED ORDER — HYDROMORPHONE HCL 1 MG/ML IJ SOLN: 0.5000 mg | INTRAMUSCULAR | Status: DC | PRN

## 2014-09-26 MED ORDER — ONDANSETRON HCL 4 MG/2ML IJ SOLN
INTRAMUSCULAR | Status: AC
  Filled 2014-09-26: qty 2

## 2014-09-26 MED ORDER — FENTANYL CITRATE (PF) 250 MCG/5ML IJ SOLN
INTRAMUSCULAR | Status: AC
  Filled 2014-09-26: qty 5

## 2014-09-26 MED ORDER — ACETAMINOPHEN 650 MG RE SUPP: 650.0000 mg | RECTAL | Status: DC | PRN

## 2014-09-26 MED ORDER — SODIUM CHLORIDE 0.9 % IJ SOLN
3.0000 mL | Freq: Two times a day (BID) | INTRAMUSCULAR | Status: DC
Start: 2014-09-26 — End: 2014-09-27
  Administered 2014-09-26: 3 mL via INTRAVENOUS

## 2014-09-26 MED ORDER — MUPIROCIN 2 % EX OINT
TOPICAL_OINTMENT | CUTANEOUS | Status: AC
  Filled 2014-09-26: qty 22

## 2014-09-26 MED ORDER — GLYCOPYRROLATE 0.2 MG/ML IJ SOLN
INTRAMUSCULAR | Status: AC
  Filled 2014-09-26: qty 2

## 2014-09-26 MED ORDER — ROCURONIUM BROMIDE 50 MG/5ML IV SOLN
INTRAVENOUS | Status: AC
  Filled 2014-09-26: qty 1

## 2014-09-26 MED ORDER — PHENYLEPHRINE HCL 10 MG/ML IJ SOLN
INTRAMUSCULAR | Status: DC | PRN
  Administered 2014-09-26: 80 ug via INTRAVENOUS

## 2014-09-26 MED ORDER — ONDANSETRON HCL 4 MG/2ML IJ SOLN: 4.0000 mg | INTRAMUSCULAR | Status: DC | PRN

## 2014-09-26 MED ORDER — GLYCOPYRROLATE 0.2 MG/ML IJ SOLN
INTRAMUSCULAR | Status: DC | PRN
  Administered 2014-09-26: 0.6 mg via INTRAVENOUS

## 2014-09-26 MED ORDER — CEFAZOLIN SODIUM 1-5 GM-% IV SOLN
1.0000 g | Freq: Three times a day (TID) | INTRAVENOUS | Status: AC
  Administered 2014-09-26 – 2014-09-27 (×2): 1 g via INTRAVENOUS
  Filled 2014-09-26 (×2): qty 50

## 2014-09-26 MED ORDER — ATORVASTATIN CALCIUM 20 MG PO TABS
20.0000 mg | ORAL_TABLET | Freq: Every day | ORAL | Status: DC
  Administered 2014-09-26: 20 mg via ORAL
  Filled 2014-09-26 (×2): qty 1

## 2014-09-26 MED ORDER — MIDAZOLAM HCL 5 MG/5ML IJ SOLN
INTRAMUSCULAR | Status: DC | PRN
  Administered 2014-09-26: 2 mg via INTRAVENOUS

## 2014-09-26 MED ORDER — ALUM & MAG HYDROXIDE-SIMETH 200-200-20 MG/5ML PO SUSP: 30.0000 mL | Freq: Four times a day (QID) | ORAL | Status: DC | PRN

## 2014-09-26 MED ORDER — ROCURONIUM BROMIDE 50 MG/5ML IV SOLN
INTRAVENOUS | Status: AC
  Filled 2014-09-26: qty 2

## 2014-09-26 MED ORDER — ROCURONIUM BROMIDE 100 MG/10ML IV SOLN
INTRAVENOUS | Status: DC | PRN
  Administered 2014-09-26: 40 mg via INTRAVENOUS

## 2014-09-26 MED ORDER — PANTOPRAZOLE SODIUM 20 MG PO TBEC
20.0000 mg | DELAYED_RELEASE_TABLET | Freq: Every day | ORAL | Status: DC
  Filled 2014-09-26: qty 1

## 2014-09-26 MED ORDER — ACETAMINOPHEN 500 MG PO TABS: 500.0000 mg | ORAL_TABLET | Freq: Four times a day (QID) | ORAL | Status: DC | PRN

## 2014-09-26 MED ORDER — PROPOFOL 10 MG/ML IV BOLUS
INTRAVENOUS | Status: DC | PRN
  Administered 2014-09-26: 100 mg via INTRAVENOUS

## 2014-09-26 MED ORDER — PROPOFOL 10 MG/ML IV BOLUS
INTRAVENOUS | Status: AC
  Filled 2014-09-26: qty 20

## 2014-09-26 MED ORDER — ASPIRIN EC 81 MG PO TBEC
81.0000 mg | DELAYED_RELEASE_TABLET | Freq: Every day | ORAL | Status: DC
  Administered 2014-09-26: 81 mg via ORAL
  Filled 2014-09-26 (×2): qty 1

## 2014-09-26 MED ORDER — LIDOCAINE HCL (CARDIAC) 20 MG/ML IV SOLN
INTRAVENOUS | Status: AC
  Filled 2014-09-26: qty 10

## 2014-09-26 MED ORDER — 0.9 % SODIUM CHLORIDE (POUR BTL) OPTIME
TOPICAL | Status: DC | PRN
  Administered 2014-09-26: 1000 mL

## 2014-09-26 MED ORDER — LOSARTAN POTASSIUM 50 MG PO TABS
100.0000 mg | ORAL_TABLET | Freq: Every day | ORAL | Status: DC
  Administered 2014-09-26: 100 mg via ORAL
  Filled 2014-09-26 (×2): qty 2

## 2014-09-26 MED ORDER — TRAMADOL HCL 50 MG PO TABS
50.0000 mg | ORAL_TABLET | Freq: Three times a day (TID) | ORAL | Status: DC | PRN
Start: 2014-09-26 — End: 2014-09-27
  Administered 2014-09-26: 50 mg via ORAL
  Filled 2014-09-26: qty 1

## 2014-09-26 MED ORDER — ONDANSETRON HCL 4 MG/2ML IJ SOLN
INTRAMUSCULAR | Status: DC | PRN
  Administered 2014-09-26: 4 mg via INTRAVENOUS

## 2014-09-26 MED ORDER — HEMOSTATIC AGENTS (NO CHARGE) OPTIME
TOPICAL | Status: DC | PRN
  Administered 2014-09-26: 1 via TOPICAL

## 2014-09-26 MED ORDER — MUPIROCIN 2 % EX OINT: 1.0000 "application " | TOPICAL_OINTMENT | Freq: Once | CUTANEOUS | Status: DC

## 2014-09-26 MED ORDER — FENTANYL CITRATE (PF) 100 MCG/2ML IJ SOLN
25.0000 ug | INTRAMUSCULAR | Status: DC | PRN
  Administered 2014-09-26: 50 ug via INTRAVENOUS

## 2014-09-26 MED ORDER — SODIUM CHLORIDE 0.9 % IR SOLN
Status: DC | PRN
  Administered 2014-09-26: 15:00:00

## 2014-09-26 MED ORDER — OXYCODONE-ACETAMINOPHEN 5-325 MG PO TABS
1.0000 | ORAL_TABLET | ORAL | Status: DC | PRN
  Administered 2014-09-26: 1 via ORAL
  Administered 2014-09-27: 2 via ORAL
  Filled 2014-09-26: qty 1
  Filled 2014-09-26: qty 2

## 2014-09-26 MED ORDER — MIDAZOLAM HCL 2 MG/2ML IJ SOLN
INTRAMUSCULAR | Status: AC
Start: 2014-09-26 — End: 2014-09-26
  Filled 2014-09-26: qty 2

## 2014-09-26 MED ORDER — ACETAMINOPHEN 325 MG PO TABS: 650.0000 mg | ORAL_TABLET | ORAL | Status: DC | PRN

## 2014-09-26 MED ORDER — EPHEDRINE SULFATE 50 MG/ML IJ SOLN
INTRAMUSCULAR | Status: DC | PRN
  Administered 2014-09-26: 15 mg via INTRAVENOUS

## 2014-09-26 MED ORDER — FENTANYL CITRATE (PF) 100 MCG/2ML IJ SOLN
INTRAMUSCULAR | Status: DC | PRN
  Administered 2014-09-26 (×4): 50 ug via INTRAVENOUS

## 2014-09-26 MED ORDER — PHENOL 1.4 % MT LIQD: 1.0000 | OROMUCOSAL | Status: DC | PRN

## 2014-09-26 MED ORDER — TAMSULOSIN HCL 0.4 MG PO CAPS: 0.4000 mg | ORAL_CAPSULE | Freq: Every day | ORAL | Status: DC

## 2014-09-26 MED ORDER — ONDANSETRON HCL 4 MG/2ML IJ SOLN
INTRAMUSCULAR | Status: AC
Start: 2014-09-26 — End: 2014-09-26
  Filled 2014-09-26: qty 2

## 2014-09-26 MED ORDER — ONDANSETRON HCL 4 MG/2ML IJ SOLN: 4.0000 mg | Freq: Once | INTRAMUSCULAR | Status: DC | PRN

## 2014-09-26 SURGICAL SUPPLY — 58 items
APL SKNCLS STERI-STRIP NONHPOA (GAUZE/BANDAGES/DRESSINGS) ×1
BAG DECANTER FOR FLEXI CONT (MISCELLANEOUS) ×2 IMPLANT
BENZOIN TINCTURE PRP APPL 2/3 (GAUZE/BANDAGES/DRESSINGS) ×2 IMPLANT
BLADE CLIPPER SURG (BLADE) ×1 IMPLANT
BLADE SURG 11 STRL SS (BLADE) ×2 IMPLANT
BRUSH SCRUB EZ PLAIN DRY (MISCELLANEOUS) ×2 IMPLANT
BUR MATCHSTICK NEURO 3.0 LAGG (BURR) ×2 IMPLANT
BUR PRECISION FLUTE 6.0 (BURR) ×2 IMPLANT
CANISTER SUCT 3000ML PPV (MISCELLANEOUS) ×2 IMPLANT
CONT SPEC 4OZ CLIKSEAL STRL BL (MISCELLANEOUS) ×2 IMPLANT
DECANTER SPIKE VIAL GLASS SM (MISCELLANEOUS) ×2 IMPLANT
DRAPE LAPAROTOMY 100X72X124 (DRAPES) ×2 IMPLANT
DRAPE MICROSCOPE LEICA (MISCELLANEOUS) ×2 IMPLANT
DRAPE POUCH INSTRU U-SHP 10X18 (DRAPES) ×2 IMPLANT
DRAPE PROXIMA HALF (DRAPES) IMPLANT
DRAPE SURG 17X23 STRL (DRAPES) ×2 IMPLANT
DRSG OPSITE 4X5.5 SM (GAUZE/BANDAGES/DRESSINGS) ×1 IMPLANT
DRSG OPSITE POSTOP 3X4 (GAUZE/BANDAGES/DRESSINGS) IMPLANT
DRSG OPSITE POSTOP 4X6 (GAUZE/BANDAGES/DRESSINGS) ×1 IMPLANT
DURAPREP 26ML APPLICATOR (WOUND CARE) ×2 IMPLANT
ELECT REM PT RETURN 9FT ADLT (ELECTROSURGICAL) ×2
ELECTRODE REM PT RTRN 9FT ADLT (ELECTROSURGICAL) ×1 IMPLANT
GAUZE SPONGE 4X4 12PLY STRL (GAUZE/BANDAGES/DRESSINGS) ×1 IMPLANT
GAUZE SPONGE 4X4 16PLY XRAY LF (GAUZE/BANDAGES/DRESSINGS) IMPLANT
GLOVE BIO SURGEON STRL SZ8 (GLOVE) ×2 IMPLANT
GLOVE BIOGEL PI IND STRL 7.5 (GLOVE) IMPLANT
GLOVE BIOGEL PI INDICATOR 7.5 (GLOVE) ×2
GLOVE ECLIPSE 9.0 STRL (GLOVE) ×1 IMPLANT
GLOVE EXAM NITRILE LRG STRL (GLOVE) IMPLANT
GLOVE EXAM NITRILE MD LF STRL (GLOVE) IMPLANT
GLOVE EXAM NITRILE XL STR (GLOVE) IMPLANT
GLOVE EXAM NITRILE XS STR PU (GLOVE) IMPLANT
GLOVE INDICATOR 8.5 STRL (GLOVE) ×2 IMPLANT
GLOVE SURG SS PI 7.0 STRL IVOR (GLOVE) ×2 IMPLANT
GOWN STRL REUS W/ TWL LRG LVL3 (GOWN DISPOSABLE) ×1 IMPLANT
GOWN STRL REUS W/ TWL XL LVL3 (GOWN DISPOSABLE) ×2 IMPLANT
GOWN STRL REUS W/TWL 2XL LVL3 (GOWN DISPOSABLE) IMPLANT
GOWN STRL REUS W/TWL LRG LVL3 (GOWN DISPOSABLE)
GOWN STRL REUS W/TWL XL LVL3 (GOWN DISPOSABLE) ×6
KIT BASIN OR (CUSTOM PROCEDURE TRAY) ×2 IMPLANT
KIT ROOM TURNOVER OR (KITS) ×2 IMPLANT
LIQUID BAND (GAUZE/BANDAGES/DRESSINGS) ×2 IMPLANT
NDL SPNL 22GX3.5 QUINCKE BK (NEEDLE) ×1 IMPLANT
NEEDLE HYPO 22GX1.5 SAFETY (NEEDLE) ×2 IMPLANT
NEEDLE SPNL 22GX3.5 QUINCKE BK (NEEDLE) ×2 IMPLANT
NS IRRIG 1000ML POUR BTL (IV SOLUTION) ×2 IMPLANT
PACK LAMINECTOMY NEURO (CUSTOM PROCEDURE TRAY) ×2 IMPLANT
RUBBERBAND STERILE (MISCELLANEOUS) ×4 IMPLANT
SPONGE SURGIFOAM ABS GEL SZ50 (HEMOSTASIS) ×2 IMPLANT
STRIP CLOSURE SKIN 1/2X4 (GAUZE/BANDAGES/DRESSINGS) ×2 IMPLANT
SUT VIC AB 0 CT1 18XCR BRD8 (SUTURE) ×1 IMPLANT
SUT VIC AB 0 CT1 8-18 (SUTURE) ×2
SUT VIC AB 2-0 CT1 18 (SUTURE) ×2 IMPLANT
SUT VICRYL 4-0 PS2 18IN ABS (SUTURE) ×2 IMPLANT
SYR 20ML ECCENTRIC (SYRINGE) ×2 IMPLANT
TOWEL OR 17X24 6PK STRL BLUE (TOWEL DISPOSABLE) ×2 IMPLANT
TOWEL OR 17X26 10 PK STRL BLUE (TOWEL DISPOSABLE) ×2 IMPLANT
WATER STERILE IRR 1000ML POUR (IV SOLUTION) ×2 IMPLANT

## 2014-09-26 NOTE — H&P (Signed)
[name] is an 69 y.o. male.   Chief Complaint: Back and left leg pain HPI: Patient is very pleasant 69 year old some is a progress worsening back and left leg pain rating down S1 nerve root pattern of his left leg. This been refractory to all forms conservative treatment imaging findings were as physical therapy epidural steroid injections workup revealed a large synovial cyst displaced left S1 nerve root induced a conservative treatment imaging findings and progressive clinical syndrome I recommended limiting for resection of left sided L5-S1 synovial cyst. I have extensively reviewed the risks and benefits of the operation with the patient as well as perioperative course expectations of outcome and alternatives of surgery and he understands and agrees to proceed forward.  Past Medical History  Diagnosis Date  . HTN (hypertension)   . Hyperlipidemia   . Allergic rhinitis   . Hemoptysis   . Hiatal hernia   . GERD (gastroesophageal reflux disease)   . Esophageal stricture   . 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 History of colonic polyps     hyperplastic  . Hypertrophy of prostate with urinary obstruction and other lower urinary tract symptoms (LUTS)   . Testicular hypofunction   . OA (osteoarthritis)   . Chronic back pain     intermittent  . Dizziness   . CHF (congestive heart failure)   . Dysrhythmia     right bundle branch block   . Shortness of breath     with exertion  . OSA (obstructive sleep apnea)     cpap- 10   . CAD (coronary artery disease)     hx of stent- 2005 RCA  . Anginal pain 05/26/14    chest pain after chasing dog  . Constipation     Past Surgical History  Procedure Laterality Date  . S/p right knee arthroscopy  2005  . Rca stenting      '05 RCA  . Hernia surgery x 3      Bilateral Inguinal, Umbicial  . Oral surg to removed growth from ?sinus  10/2010   ?Dermoid removed by DrRiggs  . Colonoscopy  12/2007    HYPERPLASTIC POLYP  . Upper gastrointestinal endoscopy    . Right toe surgery  Right     Cyst   . Irrigation and debridement abscess Left 08/14/2012    Procedure: IRRIGATION AND DEBRIDEMENT LEFT INGUINAL BOIL ;  Surgeon: Ailene Rud, MD;  Location: [facility];  Service: Urology;  Laterality: Left;  . Cardiac catheterization      '05, last 2009, showing patent RCA stent  . Left heart catheterization with coronary angiogram N/A 05/26/2014    Procedure: LEFT HEART CATHETERIZATION WITH CORONARY ANGIOGRAM;  Surgeon: Blane Ohara, MD;  Location: [facility] CATH LAB;  Service: Cardiovascular;  Laterality: N/A;  . Colonoscopy with propofol N/A 06/02/2014    Procedure: COLONOSCOPY WITH PROPOFOL;  Surgeon: [name], MD;  Location: [facility] ENDOSCOPY;  Service: Endoscopy;  Laterality: N/A;  . Esophagogastroduodenoscopy (egd) with propofol N/A 06/02/2014    Procedure: ESOPHAGOGASTRODUODENOSCOPY (EGD) WITH PROPOFOL;  Surgeon: [name], MD;  Location: [facility];  Service: Endoscopy;  Laterality: N/A;  . Coronary angioplasty  08/2003    Family History  Problem Relation Age of Onset  . Coronary artery disease Father   . Diabetes Father   . Sudden death Father     due to heart disease  .  Heart disease Father   . Hypertension    . Colon cancer Neg Hx   . Esophageal cancer Neg Hx   . Heart disease Mother   . Stomach cancer Paternal Grandmother    Social History:  reports that he quit smoking about 24 years ago. He has never used smokeless tobacco. He reports that he does not drink alcohol or use illicit drugs.  Allergies:  Allergies  Allergen Reactions  . Sulfamethoxazole-Trimethoprim Rash       . Sulfonamide Derivatives Rash         Medications Prior to Admission  Medication Sig Dispense Refill  . acetaminophen (TYLENOL) 500 MG tablet Take 500 mg by mouth every 6 (six) hours as needed for moderate pain.    . ANDROGEL PUMP 20.25  MG/ACT (1.62%) GEL Apply 2 Act topically daily.     Marland Kitchen aspirin EC 81 MG tablet Take 81 mg by mouth daily.    Marland Kitchen atorvastatin (LIPITOR) 20 MG tablet TAKE 1 BY MOUTH DAILY WITH SUPPER 90 tablet 3  . b complex vitamins tablet Take 1 tablet by mouth daily.     . carvedilol (COREG) 12.5 MG tablet TAKE 1 BY MOUTH TWICE DAILY WITH A MEAL 180 tablet 3  . Coenzyme Q10 (CO Q 10) 100 MG CAPS Take 1 capsule by mouth daily.     . fish oil-omega-3 fatty acids 1000 MG capsule Take 1 g by mouth daily.     . folic acid (FOLVITE) 734 MCG tablet Take 400 mcg by mouth daily.     . Glucosamine-Chondroitin 1500-1200 MG/30ML LIQD Take 1 tablet by mouth daily.     . hydroxypropyl methylcellulose / hypromellose (ISOPTO TEARS / GONIOVISC) 2.5 % ophthalmic solution Place 1 drop into both eyes daily as needed for dry eyes.    Marland Kitchen losartan (COZAAR) 100 MG tablet TAKE 1 BY MOUTH DAILY WITH LUNCH 90 tablet 3  . Multiple Vitamin (MULTIVITAMIN) capsule Take 1 capsule by mouth daily.      . multivitamin-lutein (OCUVITE-LUTEIN) CAPS capsule Take 1 capsule by mouth daily.    . nabumetone (RELAFEN) 500 MG tablet Take 500 mg by mouth daily.    . NON FORMULARY CPAP machine    . pantoprazole (PROTONIX) 40 MG tablet TAKE 1 BY MOUTH DAILY BEFORE BREAKFAST 90 tablet 0  . Probiotic Product (PROBIOTIC DAILY PO) Take 1 capsule by mouth daily.     . traMADol (ULTRAM) 50 MG tablet Take 50 mg by mouth 3 (three) times daily as needed for pain.     . TURMERIC PO Take 1 tablet by mouth daily.     Marland Kitchen MOVIPREP 100 G SOLR Take 1 kit (200 g total) by mouth once. (Patient not taking: Reported on 09/22/2014) 1 kit 0    Results for orders placed or performed during the hospital encounter of 09/26/14 (from the past 48 hour(s))  Basic metabolic panel     Status: None   Collection Time: 09/26/14 12:42 PM  Result Value Ref Range   Sodium 139 135 - 145 mmol/L   Potassium 3.9 3.5 - 5.1 mmol/L   Chloride 109 101 - 111 mmol/L   CO2 23 22 - 32 mmol/L    Glucose, Bld 95 70 - 99 mg/dL   BUN 12 6 - 20 mg/dL   Creatinine, Ser 0.83 0.61 - 1.24 mg/dL   Calcium 9.2 8.9 - 10.3 mg/dL   GFR calc non Af Amer >60 >60 mL/min   GFR calc Af Amer >60 >60 mL/min  Comment: (NOTE) The eGFR has been calculated using the CKD EPI equation. This calculation has not been validated in all clinical situations. eGFR's persistently <60 mL/min signify possible Chronic Kidney Disease.    Anion gap 7 5 - 15  CBC     Status: None   Collection Time: 09/26/14 12:42 PM  Result Value Ref Range   WBC 4.9 4.0 - 10.5 K/uL   RBC 4.41 4.22 - 5.81 MIL/uL   Hemoglobin 13.2 13.0 - 17.0 g/dL   HCT 39.8 39.0 - 52.0 %   MCV 90.2 78.0 - 100.0 fL   MCH 29.9 26.0 - 34.0 pg   MCHC 33.2 30.0 - 36.0 g/dL   RDW 13.3 11.5 - 15.5 %   Platelets 219 150 - 400 K/uL   No results found.  Review of Systems  Constitutional: Negative.   HENT: Negative.   Eyes: Negative.   Respiratory: Negative.   Cardiovascular: Negative.   Gastrointestinal: Negative.   Genitourinary: Negative.   Musculoskeletal: Positive for back pain and joint pain.  Skin: Negative.   Neurological: Negative.   Endo/Heme/Allergies: Negative.   Psychiatric/Behavioral: Negative.     Blood pressure 177/75, pulse 74, temperature 98.3 F (36.8 C), temperature source Oral, resp. rate 20, height 5' 8"  (1.727 m), weight 80.74 kg (178 lb), SpO2 100 %. Physical Exam  Constitutional: He is oriented to person, place, and time. He appears well-developed and well-nourished.  HENT:  Head: Normocephalic.  Eyes: Pupils are equal, round, and reactive to light.  Neck: Normal range of motion.  Respiratory: Effort normal.  GI: Soft.  Neurological: He is alert and oriented to person, place, and time. He has normal strength. GCS eye subscore is 4. GCS verbal subscore is 5. GCS motor subscore is 6.  Strength is 5 out of 5 in his iliopsoas, quads, and she's, gastrocs, and tibialis, and EHL.     Assessment/Plan 68 or gentleman  presents for a left-sided L5-S1 synovial cyst resection  [name] P 09/26/2014, 1:40 PM

## 2014-09-26 NOTE — Transfer of Care (Signed)
Immediate Anesthesia Transfer of Care Note  Patient: [name]  Procedure(s) Performed: Procedure(s): Lumbar Laminectomy for resection of synovial cyst  Lumbar five- sacral one left (Left)  Patient Location: PACU  Anesthesia Type:General  Level of Consciousness: awake and oriented  Airway & Oxygen Therapy: Patient Spontanous Breathing and Patient connected to nasal cannula oxygen  Post-op Assessment: Report given to RN and Patient moving all extremities X 4  Post vital signs: Reviewed and stable  Last Vitals:  Filed Vitals:   09/26/14 1236  BP: 177/75  Pulse: 74  Temp: 36.8 C  Resp: 20    Complications: No apparent anesthesia complications

## 2014-09-26 NOTE — Progress Notes (Signed)
RN assisted pt. With placing on cpap. Pt. Tolerating well at this time.

## 2014-09-26 NOTE — Anesthesia Procedure Notes (Signed)
Procedure Name: Intubation Date/Time: 09/26/2014 1:57 PM Performed by: Clearnce Sorrel Pre-anesthesia Checklist: Patient identified, Timeout performed, Emergency Drugs available, Suction available and Patient being monitored Patient Re-evaluated:Patient Re-evaluated prior to inductionOxygen Delivery Method: Circle system utilized Preoxygenation: Pre-oxygenation with 100% oxygen Intubation Type: IV induction Ventilation: Mask ventilation without difficulty and Oral airway inserted - appropriate to patient size Laryngoscope Size: Mac and 4 Grade View: Grade III Tube type: Oral Tube size: 7.5 mm Number of attempts: 1 Placement Confirmation: ETT inserted through vocal cords under direct vision,  breath sounds checked- equal and bilateral and positive ETCO2 Secured at: 23 cm Tube secured with: Tape Dental Injury: Teeth and Oropharynx as per pre-operative assessment

## 2014-09-26 NOTE — Anesthesia Postprocedure Evaluation (Signed)
  Anesthesia Post-op Note  Patient: [name]  Procedure(s) Performed: Procedure(s): Lumbar Laminectomy for resection of synovial cyst  Lumbar five- sacral one left (Left)  Patient Location: PACU  Anesthesia Type: General   Level of Consciousness: awake, alert  and oriented  Airway and Oxygen Therapy: Patient Spontanous Breathing  Post-op Pain: mild  Post-op Assessment: Post-op Vital signs reviewed  Post-op Vital Signs: Reviewed  Last Vitals:  Filed Vitals:   09/26/14 1639  BP: 152/68  Pulse: 69  Temp:   Resp: 12    Complications: No apparent anesthesia complications

## 2014-09-26 NOTE — Plan of Care (Signed)
Problem: Consults Goal: Diagnosis - Spinal Surgery Outcome: Completed/Met Date Met:  09/26/14 Lumbar Laminectomy (Complex)     

## 2014-09-26 NOTE — Op Note (Signed)
Preoperative diagnosis: Left S1 radiculopathy from synovial cyst L5-S1 left  Postoperative diagnosis: Left-sided L5-S1 synovial cyst and osteophyte  Procedure: Lumbar laminectomy for resection of left-sided L5-S1 synovial cyst with microdissection of the left S1 nerve root microscopic resection of synovial cyst and osteophyte.  Surgeon: Dominica Severin Rayola Everhart  Asst.: Mallie Mussel pool  Anesthesia: Gen.  EBL: Minimal  History of present illness: Patient is 59 or gentleman is a long same back pain and left S1 radiculopathy workup revealed a progressively enlarging synovial cyst and severe foraminal stenosis of left S1 nerve root. Due to patient's failed conservative treatment imaging findings and progression of clinical syndrome I recommended decompressive laminectomy for resection of synovial cyst. Extensive the risks and benefits of the operation with the patient as well as perioperative course expectations of outcome and alternatives of surgery and he understood and agreed to proceed forward.  Operative procedure: Patient brought into the or was induced under general anesthesia positioned prone the Wilson frame his back was prepped and draped in routine sterile fashion preoperative x-ray localize the L5-S1 disc space so after infiltration 10 mL lidocaine with epi a midline incision made with Bovie light cautery was used was used to calcification subperiosteal dissection carried on the left at L5-S1. Interoperative x-ray confirmed identification appropriate level. Then the interest of the lamina of L5 medial facet complex super aspect of the lamina vessel was drilled down a high-speed drill laminotomy was begun with a 2 and 3 mm Kerrison punch the large osteophyte coming off the medial aspect of the L5-S1 facet as well as as well as a synovial cyst was immediately identified marching superiorly and extending the laminotomy up behind L5 identified native dura I teased the hypertrophied ligament off the dura and then  under my scopic illumination I teased the cyst membrane off the lateral dura and under bit the facet complex marching inferiorly. There was a very large osteophyte coming off the inferior medial aspect of the facet joint causing severe compression of the S1 nerve root. This was teased off of the S1 nerve root with a 2 mm Kerrison punch marching out the S1 foramen the cyst was resected and a large osteophyte was removed. At the end of decompression there is no further stenosis and the S1 nerve root thecal sac was widely decompressed was no residual cyst membrane appreciated. The spaces inspected and felt not to be stenotic or herniated so was left alone. Was in to proceed her get meticulous in space was maintained Gelfoam was overlaid top of the dura the muscle fascia proximally layers with after Vicryl and the skin was closed running 4 subcuticular benzoin and Steri-Strips were applied patient recovered in stable condition. At the end of case on it counts sponge counts were correct.

## 2014-09-27 ENCOUNTER — Encounter (HOSPITAL_COMMUNITY): Payer: Self-pay | Admitting: Neurosurgery

## 2014-09-27 DIAGNOSIS — E785 Hyperlipidemia, unspecified: Secondary | ICD-10-CM | POA: Diagnosis not present

## 2014-09-27 DIAGNOSIS — M7138 Other bursal cyst, other site: Secondary | ICD-10-CM | POA: Diagnosis not present

## 2014-09-27 DIAGNOSIS — M4807 Spinal stenosis, lumbosacral region: Secondary | ICD-10-CM | POA: Diagnosis not present

## 2014-09-27 DIAGNOSIS — M47897 Other spondylosis, lumbosacral region: Secondary | ICD-10-CM | POA: Diagnosis not present

## 2014-09-27 DIAGNOSIS — K219 Gastro-esophageal reflux disease without esophagitis: Secondary | ICD-10-CM | POA: Diagnosis not present

## 2014-09-27 DIAGNOSIS — I10 Essential (primary) hypertension: Secondary | ICD-10-CM | POA: Diagnosis not present

## 2014-09-27 MED ORDER — TAMSULOSIN HCL 0.4 MG PO CAPS
0.4000 mg | ORAL_CAPSULE | Freq: Every day | ORAL | Status: DC
  Administered 2014-09-27: 0.4 mg via ORAL
  Filled 2014-09-27: qty 1

## 2014-09-27 MED ORDER — OXYCODONE-ACETAMINOPHEN 5-325 MG PO TABS: 1.0000 | ORAL_TABLET | ORAL | Status: DC | PRN

## 2014-09-27 MED ORDER — TAMSULOSIN HCL 0.4 MG PO CAPS: 0.4000 mg | ORAL_CAPSULE | Freq: Every day | ORAL | Status: DC

## 2014-09-27 NOTE — Progress Notes (Signed)
Patient void 173ml of clear yellow urine.

## 2014-09-27 NOTE — Progress Notes (Signed)
MD made aware of patient voiding status. Patient to be discharged home this am.

## 2014-09-27 NOTE — Discharge Instructions (Signed)

## 2014-09-27 NOTE — Discharge Summary (Signed)
Physician Discharge Summary  Patient ID: [name] MRN: [id number] DOB/AGE: [birth year] 69 y.o.  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  Consults: Significant Diagnostic Studies: Treatments: Lumbar laminectomy L5-S1 Discharge Exam: Blood pressure 101/51, pulse 85, temperature 98.6 F (37 C), temperature source Oral, resp. rate 18, height 5' 8"  (1.727 m), weight 80.74 kg (178 lb), SpO2 99 %. Strength 5 out of 5 wound clean dry and intact  Disposition: Home     Medication List    TAKE these medications        acetaminophen 500 MG tablet  Commonly known as:  TYLENOL  Take 500 mg by mouth every 6 (six) hours as needed for moderate pain.     ANDROGEL PUMP 20.25 MG/ACT (1.62%) Gel  Generic drug:  Testosterone  Apply 2 Act topically daily.     aspirin EC 81 MG tablet  Take 81 mg by mouth daily.     atorvastatin 20 MG tablet  Commonly known as:  LIPITOR  TAKE 1 BY MOUTH DAILY WITH SUPPER     b complex vitamins tablet  Take 1 tablet by mouth daily.     carvedilol 12.5 MG tablet  Commonly known as:  COREG  TAKE 1 BY MOUTH TWICE DAILY WITH A MEAL     Co Q 10 100 MG Caps  Take 1 capsule by mouth daily.     fish oil-omega-3 fatty acids 1000 MG capsule  Take 1 g by mouth daily.     folic acid 297 MCG tablet  Commonly known as:   FOLVITE  Take 400 mcg by mouth daily.     Glucosamine-Chondroitin 1500-1200 MG/30ML Liqd  Take 1 tablet by mouth daily.     hydroxypropyl methylcellulose / hypromellose 2.5 % ophthalmic solution  Commonly known as:  ISOPTO TEARS / GONIOVISC  Place 1 drop into both eyes daily as needed for dry eyes.     losartan 100 MG tablet  Commonly known as:  COZAAR  TAKE 1 BY MOUTH DAILY WITH LUNCH     multivitamin capsule  Take 1 capsule by mouth daily.     multivitamin-lutein Caps capsule  Take 1 capsule by mouth daily.     NON FORMULARY  CPAP machine     oxyCODONE-acetaminophen 5-325 MG per tablet  Commonly known as:  PERCOCET/ROXICET  Take 1-2 tablets by mouth every 4 (four) hours as needed for moderate pain.     pantoprazole 40 MG tablet  Commonly known as:  PROTONIX  TAKE 1 BY MOUTH DAILY BEFORE BREAKFAST     PROBIOTIC DAILY PO  Take 1 capsule by mouth daily.     tamsulosin 0.4 MG Caps capsule  Commonly known as:  FLOMAX  Take 1 capsule (0.4 mg total) by mouth daily.     traMADol 50 MG tablet  Commonly known as:  ULTRAM  Take 50 mg by mouth 3 (three) times daily as needed for pain.  TURMERIC PO  Take 1 tablet by mouth daily.      ASK your doctor about these medications        MOVIPREP 100 G Solr  Generic drug:  peg 3350 powder  Take 1 kit (200 g total) by mouth once.           Follow-up Information    Follow up with [facility] P, MD.   Specialty:  Neurosurgery   Contact information:   [address] [place] [address] [phone]       Signed: Elaina Hoops 09/27/2014, 7:38 AM

## 2014-09-27 NOTE — Progress Notes (Signed)
Patient voids 218ml of clear yellow urine. And bladder scan was perform with result of 31ml of urine noted. Will continue to monitor.

## 2014-09-27 NOTE — Progress Notes (Signed)
Patient ID: [name], male   DOB: Dec 27, 1945, 69 y.o.   MRN: [id number] Doing well no leg pain minimal back soreness difficulty voiding but able to void  Strength out of 5 wound clean dry and intact  Observe this morning recheck post forward residual plan discharge if voiding adequately

## 2014-09-27 NOTE — Progress Notes (Signed)
Patient alert and oriented, mae's well, voiding adequate amount of urine, swallowing without difficulty, no c/o pain. Patient discharged home with family. Script and discharged instructions given to patient. Patient and family stated understanding of d/c instructions given and has an appointment with MD. 

## 2014-11-02 ENCOUNTER — Encounter: Payer: Self-pay | Admitting: Internal Medicine

## 2014-11-02 ENCOUNTER — Encounter: Payer: Self-pay | Admitting: Gastroenterology

## 2014-11-11 DIAGNOSIS — D239 Other benign neoplasm of skin, unspecified: Secondary | ICD-10-CM | POA: Diagnosis not present

## 2014-11-11 DIAGNOSIS — L570 Actinic keratosis: Secondary | ICD-10-CM | POA: Diagnosis not present

## 2014-11-28 ENCOUNTER — Ambulatory Visit: Payer: Medicare Other | Admitting: Pulmonary Disease

## 2014-11-29 ENCOUNTER — Ambulatory Visit (INDEPENDENT_AMBULATORY_CARE_PROVIDER_SITE_OTHER): Payer: Medicare Other | Admitting: Pulmonary Disease

## 2014-11-29 ENCOUNTER — Encounter: Payer: Self-pay | Admitting: Pulmonary Disease

## 2014-11-29 VITALS — BP 142/72 | HR 68 | Temp 96.8°F | Wt 184.8 lb

## 2014-11-29 DIAGNOSIS — I25118 Atherosclerotic heart disease of native coronary artery with other forms of angina pectoris: Secondary | ICD-10-CM

## 2014-11-29 DIAGNOSIS — K219 Gastro-esophageal reflux disease without esophagitis: Secondary | ICD-10-CM

## 2014-11-29 DIAGNOSIS — R0600 Dyspnea, unspecified: Secondary | ICD-10-CM | POA: Insufficient documentation

## 2014-11-29 DIAGNOSIS — M6281 Muscle weakness (generalized): Secondary | ICD-10-CM | POA: Diagnosis not present

## 2014-11-29 DIAGNOSIS — R262 Difficulty in walking, not elsewhere classified: Secondary | ICD-10-CM | POA: Diagnosis not present

## 2014-11-29 DIAGNOSIS — I25119 Atherosclerotic heart disease of native coronary artery with unspecified angina pectoris: Secondary | ICD-10-CM

## 2014-11-29 DIAGNOSIS — D126 Benign neoplasm of colon, unspecified: Secondary | ICD-10-CM

## 2014-11-29 DIAGNOSIS — H35033 Hypertensive retinopathy, bilateral: Secondary | ICD-10-CM | POA: Diagnosis not present

## 2014-11-29 DIAGNOSIS — M545 Low back pain, unspecified: Secondary | ICD-10-CM

## 2014-11-29 DIAGNOSIS — M150 Primary generalized (osteo)arthritis: Secondary | ICD-10-CM

## 2014-11-29 DIAGNOSIS — E291 Testicular hypofunction: Secondary | ICD-10-CM

## 2014-11-29 DIAGNOSIS — N138 Other obstructive and reflux uropathy: Secondary | ICD-10-CM

## 2014-11-29 DIAGNOSIS — M5137 Other intervertebral disc degeneration, lumbosacral region: Secondary | ICD-10-CM | POA: Diagnosis not present

## 2014-11-29 DIAGNOSIS — N401 Enlarged prostate with lower urinary tract symptoms: Secondary | ICD-10-CM

## 2014-11-29 DIAGNOSIS — G4733 Obstructive sleep apnea (adult) (pediatric): Secondary | ICD-10-CM

## 2014-11-29 DIAGNOSIS — H524 Presbyopia: Secondary | ICD-10-CM | POA: Diagnosis not present

## 2014-11-29 DIAGNOSIS — I10 Essential (primary) hypertension: Secondary | ICD-10-CM | POA: Diagnosis not present

## 2014-11-29 DIAGNOSIS — E785 Hyperlipidemia, unspecified: Secondary | ICD-10-CM

## 2014-11-29 DIAGNOSIS — Z23 Encounter for immunization: Secondary | ICD-10-CM

## 2014-11-29 DIAGNOSIS — M159 Polyosteoarthritis, unspecified: Secondary | ICD-10-CM

## 2014-11-29 MED ORDER — TAMSULOSIN HCL 0.4 MG PO CAPS: 0.4000 mg | ORAL_CAPSULE | Freq: Every day | ORAL | Status: DC

## 2014-11-29 MED ORDER — AZITHROMYCIN 250 MG PO TABS: ORAL_TABLET | ORAL | Status: DC

## 2014-11-29 NOTE — Progress Notes (Signed)
Subjective:    Patient ID: [name], male    DOB: [birth year], 69 y.o.   MRN: [id number]  HPI 69 y/o WM here for a follow up visit... he has multiple medical problems as noted below...    ~  December 20, 2009:  generally stable overall- notes some nocturnal CTS symptoms but doesn't want to use wrist splints; exercising daily w/ treadmill, etc- notes tired & SOB after 45 min work out or if taking stairs... OSA stable on CPAP; BP controlled on meds & denies angina, palpit, etc; Lipids stable on Cres10, Niacin, FishOil; denies any GI concerns at present; saw DrTannenbaum recently w/ PSA reported at 1.3 but found "Low-T" & planning more tests; still taking Celebrex regularly for his DJD; requests refill MMW for Prn use...   ~  June 18, 2010:  he's had a good 87mo still c/o bilat CTS symtpms in the AM & he will try the wrist splints Qhs...  he was started on AXIRON topical testosterone Rx per DrTannenbaum & improved... he saw DrCooper 8/11 & stable from the CV standpoint- no changes made... [facility] ?[facility] ?LN in neck- nothing pathological found & told to watch it (exam today= neg no node palp)... he will ret for Fasting blood work...  ~  January 07, 2011:  768moOV & he has been doing well overall but has several requests today> wants generic replacement for Diovan (we discussed Losartan); and wants referral to DrDeveshwar for Rheum eval, plus cheaper Celebrex (we discussed Etodolac)...    His BP is well controlled on Coreg & Diovan> measures 132/82 today & he remains asymptomatic;  FLP last done 2/12 & looked great w/ LDL 51 on Crestor10, but HDL low at 26 despite Niacin & fish oil rx> rec continue same meds, low fat diet, & incr exercise)...    He continues to f/u w/ DrCooper for Cards every 35m24moeen last wk> CAD, s/p stent RCA 2005, LBBB; last cath was 2009 w/ patent RCA stent & mild non-obstructive dis elsewhere; doing well, exercising regularly, no CP/ palpit/  SOB/ edema/ etc; rec to continue ASA/ Plavix,     He saw DrTannenbaum 6/12 for Urology f/u> BPH, Hypogonad- on AXIRON w/ incr energy, better appetite, sl wt gain; rec to continue same Rx...    Finally he reports some type of ENT/ Oral surg 6wks ago> [facility] a growth in his sinus= ?dermoid...  ~  July 10, 2011:  35mo65mo & DannEdwardss he is doing fine- no new complaints or concerns; we reviewed his Prob List, Meds, XRays & Labs...    OSA> stable on CPAP w/ supplies Q35mo 31mo [place]issues w/ his machine or interface; rests well, wakes refreshed, no daytime hypersomnolence & no prob w/ alertness...    HBP & CAD, s/p stent in RCA 2005, LBBB, EF=45-50% w/ septal dysynergy> followed by DrCooper on ASA/ Plavix/ Coreg12.5Bid/ Cozaar100; BP= 118/64 & he denies CP, palpit, dizzy, SOB, edema...    CHOL> on Cres10, slo-niacin, FishOil, CoQ10, etc; FLP 2/13 showed TChol 124, TG 167, HDL 33, LDL 57; needs better low fat diet...    GI> HH, Hx stricture, antral nodule=adenoma> on Protonix40 followed by DrPatterson & JacobArdis Hughs below)...    GU> BPH, LTOS, Low-T> on Axiron; he relates a story about GU symptoms & extensive work up in their office "doing just fine" he says, incr  energy, etc; treated for seminal vesiculitis w/ Doxy x3wks...    Ortho> DJD, LBP> prev on Celebrex & switched to generic Etodolac; he notes that he's been hurting since he was 69 y/o w/ a strong fam hx arthritis; he requested referral to Rheum & he saw DrDeveshwar but she indicated that anti-inflamm meds wouldn't help & he should take pain med 3/d or as needed...    Derm> Hx skin cancers treated by DrDJones...  LABS 2/13:  FLP- elevTG & lowHDL on Cres10;  Chems- wnl;  CBC- wnl;  TSH=1.91;  PSA=2.08;  VitD=58;  UA- clear  ~  January 07, 2012:  48moROV & DJanthonyis feeling well overall just c/o some intermittent nausea after eating & we discussed further eval by DrJacobs (GI) when he is  ready> he had neg Sonar 2009 x for incr liver echodensity suggesting some steatosis...  He also notes that he is sched for foot surg 11/13 by DArnette Norris..  We reviewed his cardiac eval from DrCooper >>    We reviewed prob list, meds, xrays and labs> see below>>  LABS 7/13 by DrDeveshwar:  Chems- wnl x BS=109;  CBC- wnl  ~  November 29, 2014:  3 year ROV & DAllardreturns after a long hiatus, expertly cared for in the interval by DrCooper for Cards, DrCram for NS, DrDeveshwar for Rheum, DrTannenbaum for Urology, and DrCornett for CCS... He tells me that he needs 2 knees, had lumbar lam by DrCram about 274mogo, and he wants to see ENT due to losing his voice- voice getting weaker assoc w/ dry cough, denies sput, c/o mild SOB "it's my heart"    OSA> stable on CPAP w/ supplies Q6m70moom AprWynnewoodo issues w/ his machine or interface; rests well, wakes refreshed, no daytime hypersomnolence & no prob w/ alertness...    HBP & CAD, s/p stent in RCA 2005, LBBB, decr EF w/ septal dysynergy, chr dyspnea> followed by DrCooper on ASA/ Plavix/ Coreg12.5Bid/ Cozaar100; BP= 142/72 & he denies CP, palpit, dizzy, SOB, edema; last saw DrCooper 07/2014 & his note is reviewed    CHOL> on Lip20, FishOil, CoQ10; FLP 9/15 showed TChol 125, TG 154, HDL 30, LDL 64; needs better low fat diet...    GI> HH, Hx stricture, antral nodule=adenoma> on Protonix40 followed by JacArdis Hughsee below); colonoscopy 1/16 was neg/ wnl...    GU> BPH, LTOS, Low-T> on Flomax0.4 & Androgel per DrTannenbaum; he relates a story about GU symptoms & extensive work up in their office "doing just fine" he says, incr energy, etc; treated for seminal vesiculitis w/ Doxy in the past; last seen 3/16 and note reviewed...     Ortho> DJD, LBP> prev on Celebrex & switched to Etodolac & now Percocet & Tramadol; he notes that he's been hurting since he was [age]o w/ a strong fam hx arthritis; he saw DrDeveshwar for Rheum & DrCram for NS- s/p Lumbar decompression for resection of  synovial cyst & large bone spur L5-S1    Derm> Hx skin cancers treated by DrDJones... EXAM showed Afeb, VSS, O2sat=96% on RA;  HEENT- neg, mallampati1;  Chest- clear w/o w/r/r;  Heart- RR gr1/6 SEM S4 w/o rubs;  Abd- soft, nontender, neg;  Ext- neg w/o c/c/e;  Neuro- intact... We reviewed prob list, meds, xrays and labs> see below for updates >> given TDAP & Prevnar-13, he reports that he got the Pneumovax23 at health dept in 2009...  2DEcho 07/22/11 showed mild LVH, & decr LVF w/ EF~30% w/  HK, Gr1 DD, mild MR, mild LAdil, ..   CXR 08/13/12 showed norm heart size, clear lungs, NAD.Marland KitchenMarland Kitchen   EKG 05/24/14 showed NSR, rate76, LBBB, no acute changes...  CATH [id number] showed patent coronaries and open stent in RCA, nonobstructive dis in LAD, mild to mod LVD (EF=50%) w/ norm LVEDP- medical management.  Spirometry 11/29/14 showed FVC=2.84 (67%), FEV1=2.05 (63%), %1sec=72, mid-flows reduced at 46% predicted; c/w mild airflow obstruction but can't r/o superimposed restriction w/o lung volumes, etc...   LABS 5/16 showed Chems- wnl & CBC- wnl IMP/PLAN>>  A lot to get caught up on after a 3 yr hiatus;  meds refilled, he wants ZPak for prn use, TDAP & Prevnar-13 today, refer to ENT for check due to losing his voice...           PROBLEM LIST:  ENT >> some type of ENT/ Oral surg 7/12> referred by his dentist to [facility] w/ surg thru gums to remove a growth in his sinus= ?dermoid...  OBSTRUCTIVE SLEEP APNEA - Hx OSA w/ sleep study 11/02 RDI=18 and desat to 85%; seen by DrClance and on CPAP rx;  he's been using it regularly, denies daytime hypersomnolence, etc;  He gets machine check & new mask Q9mothru [employer] & he reports download showed 12,000 hrs use! ~  CXR 3/10 clear w/ min left basilar scarring... ~  Pt gets his CPAP machine checked & masks replaced every 638mohru ApMac[place]. ~  CXR 10/12 showed normal heart size, clear lungs x left base atx, mild degen changes in TSpine...  [facility] after  episode of BRB hemoptysis> full eval was neg, no recurrence... he had bronchoscopy- neg,  ENT eval DrRosen- neg, noted some persistant cough treated w/ Mucinex DM, Zyrtek, Tussionex...  no recurrence.  HBP & CAD-S/P stent in RCA on 2005 - followed regularly by DrCooper on ASA 8115m,  PLAVIX 75m12m  COREG 12.5mgB35m  LOSARTAN 100mg/24m  BP= 120/72 today & similar at home> denies HA, fatigue, visual changes, CP, palipit, dizziness, syncope, dyspnea, edema, etc...  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 ~  2/13: he had f/u DrCooper> still c/o fatigue & dyspnea; no angina, BP controlled, BNP=25, EKG showed NSR, rate72, LBBB;  2DEcho & cardiac MRI are planned... ~  2DEcho 3/13 showed decr EF=30% w/ HK, mild LVH, Gr1DD, mild MR ~  3/13:  Cardiac MRI showed mild LVenlargement, mild LAE, no scar or infarct, no ischemia, no regional wall motion abn, but there was abn septal motion & EF=48%  HYPERLIPIDEMIA - he follows a low fat diet and takes CRESTOR 10mg/d89mACIN 500mg/d,95mh Oil, Flax Seed Oil, & CoQ10... ~  FLP 9/08[place] TChol 110, TG 82, HDL 28, LDL 66 ~  FLP 8/09 showed TChol 177, TG 130, HDL 36, LDL 115 ~  FLP 1/10 showed TChol 114, TG 109, HDL 31, LDL 62 ~  FLP 2/11 showed TChol 103, TG 85, HDL 36, LDL 51 ~  FLP 2/12 showed TChol 101, TG 121, HDL 26, LDL 51 ~  FLP 2/13 on Cres10+Niacin500 showed TChol 124, TG 167, HDL 33, LDL 57  HIATAL HERNIA, Hx of ESOPHAGEAL STRICTURE, OTHER SPECIFIED DISORDER OF STOMACH AND DUODENUM (ICD-537.89) >> he gets the [facility] along w/ the similar product from [facility]- "I read too much"- he stopped PPI/ Dexilant Rx in favor of "Vinegar & Honey- it works as well per the [employer]"...  ~  he had an EGD 6/08 showing a 3 cm HH & distal esoph stricture dilated;  Rx Pepcid 25m/d... ~  EGD 09/02/08 by DrPerry showed a HH, schatzki ring, 1cm submucosal nodule in antrum, 2cm sessile polyp in duod. 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 ~  f/u DrPatterson 6/10- continue KAPIDEX & reflux regimen... ~  1/12:  he is overdue for f/u w/ GI & we will request appt. ~  11/12: he saw DrJacobs for f/u eval & had EGD12/12 showing Schatzki's ring, sessile duod polyp (Bx=tubular adenoma), unchanged submucosal nodule... ~  8/13:  He lists PROTONIX 4056m on his current med list... ~  1/16:  EGD by DrJacobs showed a schatzki's ring above a 3cm HH, antral subepith lesion unchanged from 3 yrs ago, 3mm78mlypoid mucosa in duodenum (path= duod adenoma)- they plan repeat in 3 yrs...  Hx of Hyperplastic COLONIC POLYP in 1999 - all followed by DrPatterson;  ~  his last colonoscopy was 8/04 and normal without recurrent polyps... ~  f/u colonoscopy 8/09 by DrPatterson showed 2mm 50myp, bx= hyperplastic, f/u planned 55yrs.73yr1/16:  Colonoscopy by DrJacobs was wnl & f/u rec in 10 yrs.  Hx Elevated LFT's - routine CPX 12/08 w/ abn LFTs- this was felt to be secondary to his Statin therapy... he was told to stop the Statin and f/u labs in one month... he requested a liver evaluation from DrPatterson and he was seen 12/09 at which time DrPatterson rechecked his LFT's and they were all back to normal... he also checked the following: ~  AbdSonar w/ echodense liver, no lesions or GB problems... ~  Viral Hepatitis serologies were all negative, including HepC << 8/12: CDC came out w/ new rec  that all Boomers should be checked for HepC. ~  A1AT level was normal ~  Ceruloplasmin level was normal ~  ANA was neg, and Antimitochondrial AB/ Anti Smooth Muscle Ab were neg as well... ~  Serum Fe was normal & B12/Folate levels were normal as well... ~  2/12 NOTE:  LFTs have remained WNL & he is on Cres10.  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 ~  1/12: he reports Rx by DrTannenbaum w/ AXIRON 30mg p82mctuation- use under each arm daily; Testos level 12/11= 575 on Rx.  OSTEOARTHRITIS, Hx of BACK PAIN - as noted he feels he must take the Celebrex daily as his pain (esp R knee) is much worse when he doesn't take it;  he sees DrOlin and had a RKnee arthroscopy 9/07> he uses Glucosamine, MVI, Vit D & Folic... ~  7/10:  he requests Rx for Celebrex 100mg #969make 1 cap daily as needed (advised just Prn use). ~  8/12:  Pt requests generic substitute for Celebrex (try ETODOLAC 483m) & referral to DrDeveshwar for Rheumatology eval... ~  12/12:  Seen by DrDeveshwar for his OA, left shoulder pain w/ rotator cuff tear per DrCollins, tried shot; he has OA in hands and feet; rec Tramadol vs Etodolac Rx...  Skin Cancers - he is followed by Dr DJarome Matinw[facility]- skin peel that attacks cancer cells.  [place] 69(2011);  he get the yearly seasonal Flu vaccine every fall;  he had a shingles vaccine as well;  [place]  up-to-date on colonoscopy done 2009; prostate checks by DrTannenbaum yearly...   Past Surgical History  Procedure Laterality Date  . S/p right knee arthroscopy  2005  . Rca stenting      '05 RCA  . Hernia surgery x 3      Bilateral Inguinal, Umbicial   . Oral surg to removed growth from ?sinus  10/2010    ?Dermoid removed by DrRiggs  . Colonoscopy  12/2007    HYPERPLASTIC POLYP  . Upper gastrointestinal endoscopy    . Right toe surgery  Right     Cyst   . Irrigation and debridement abscess Left 08/14/2012    Procedure: IRRIGATION AND DEBRIDEMENT LEFT INGUINAL BOIL ;  Surgeon: SAilene Rud MD;  Location: [facility];  Service: Urology;  Laterality: Left;  . Cardiac catheterization      '05, last 2009, showing patent RCA stent  . Left heart catheterization with coronary angiogram N/A 05/26/2014    Procedure: LEFT HEART CATHETERIZATION WITH CORONARY ANGIOGRAM;  Surgeon: MBlane Ohara MD;  [facility];  Service: Cardiovascular;  Laterality: N/A;  . Colonoscopy with propofol N/A 06/02/2014    Procedure: COLONOSCOPY WITH PROPOFOL;  Surgeon: DMilus Banister MD;  Location: [facility];  Service: Endoscopy;  Laterality: N/A;  . Esophagogastroduodenoscopy (egd) with propofol N/A 06/02/2014    Procedure: ESOPHAGOGASTRODUODENOSCOPY (EGD) WITH PROPOFOL;  Surgeon: DMilus Banister MD;  Location: [facility];  Service: Endoscopy;  Laterality: N/A;  . Coronary angioplasty  08/2003  . Lumbar laminectomy/decompression microdiscectomy Left 09/26/2014    Procedure: Lumbar Laminectomy for resection of synovial cyst  Lumbar five- sacral one left;  Surgeon: GKary Kos MD;  [facility];  Service: Neurosurgery;  Laterality: Left;    Outpatient Encounter Prescriptions as of 11/29/2014  Medication Sig  . acetaminophen (TYLENOL) 500 MG tablet Take 500 mg by mouth every 6 (six) hours as needed for moderate pain.  . ANDROGEL PUMP 20.25 MG/ACT (1.62%) GEL Apply 2 Act topically daily.   .Marland Kitchenaspirin EC 81 MG tablet Take 81 mg by mouth daily.  .Marland Kitchenatorvastatin (LIPITOR) 20 MG tablet TAKE 1 BY MOUTH DAILY WITH SUPPER  . b complex vitamins tablet Take 1 tablet by mouth daily.   . carvedilol (COREG) 12.5 MG tablet TAKE 1 BY MOUTH TWICE DAILY WITH A MEAL   . Coenzyme Q10 (CO Q 10) 100 MG CAPS Take 1 capsule by mouth daily.   . fish oil-omega-3 fatty acids 1000 MG capsule Take 1 g by mouth daily.   . folic acid (FOLVITE) 4076MCG tablet Take 400 mcg by mouth daily.   . Glucosamine-Chondroitin 1500-1200 MG/30ML LIQD Take 1 tablet by mouth daily.   . hydroxypropyl methylcellulose / hypromellose (ISOPTO TEARS / GONIOVISC) 2.5 % ophthalmic solution Place 1 drop into both eyes daily as needed for dry eyes.  .Marland Kitchenlosartan (  COZAAR) 100 MG tablet TAKE 1 BY MOUTH DAILY WITH LUNCH  . Multiple Vitamin (MULTIVITAMIN) capsule Take 1 capsule by mouth daily.    . multivitamin-lutein (OCUVITE-LUTEIN) CAPS capsule Take 1 capsule by mouth daily.  . NON FORMULARY CPAP machine  . oxyCODONE-acetaminophen (PERCOCET/ROXICET) 5-325 MG per tablet Take 1-2 tablets by mouth every 4 (four) hours as needed for moderate pain.  . pantoprazole (PROTONIX) 40 MG tablet TAKE 1 BY MOUTH DAILY BEFORE BREAKFAST  . Probiotic Product (PROBIOTIC DAILY PO) Take 1 capsule by mouth daily.   . tamsulosin (FLOMAX) 0.4 MG CAPS capsule Take 1 capsule (0.4 mg total) by mouth daily.  . traMADol (ULTRAM) 50 MG tablet Take 50 mg by mouth 3 (three) times daily as needed for pain.   . TURMERIC PO Take 1 tablet by mouth daily.   . [DISCONTINUED] tamsulosin (FLOMAX) 0.4 MG CAPS capsule Take 1 capsule (0.4 mg total) by mouth daily.   No facility-administered encounter medications on file as of 11/29/2014.    Allergies  Allergen Reactions  . Sulfamethoxazole-Trimethoprim Rash       . Sulfonamide Derivatives Rash         Current Medications, Allergies, Past Medical History, Past Surgical History, Family History, and Social History were reviewed in [employer] record.     Review of Systems       See HPI - all other systems neg except as noted...       The patient complains of dyspnea on exertion.  The patient denies anorexia, fever, weight loss, weight gain, vision loss,  decreased hearing, hoarseness, chest pain, syncope, peripheral edema, prolonged cough, headaches, hemoptysis, abdominal pain, melena, hematochezia, severe indigestion/heartburn, hematuria, incontinence, muscle weakness, suspicious skin lesions, transient blindness, difficulty walking, depression, unusual weight change, abnormal bleeding, enlarged lymph nodes, and angioedema.     Objective:   Physical Exam      WD, WN, 69 y/o WM in NAD... GENERAL:  Alert & oriented; pleasant & cooperative. HEENT:  [place]/AT, EOM-wnl, PERRLA, EACs-clear, TMs-wnl, NOSE-clear, THROAT-clear & wnl. NECK:  Supple w/ fairROM; no JVD; normal carotid impulses w/o bruits; no thyromegaly or nodules palpated; no lymphadenopathy. CHEST:  Clear to P & A; without wheezes/ rales/ or rhonchi. HEART:  Regular Rhythm; without murmurs/ rubs/ or gallops. ABDOMEN:  Soft & nontender; normal bowel sounds; no organomegaly or masses detected. EXT: without deformities, mild arthritic changes; no varicose veins/ venous insuffic/ or edema. NEURO:  CN's intact; motor testing normal; sensory testing normal; gait normal & balance OK. DERM:  No lesions noted; no rash etc...  RADIOLOGY DATA:  Reviewed in the EPIC EMR & discussed w/ the patient...  LABORATORY DATA:  Reviewed in the EPIC EMR & discussed w/ the patient...   Assessment & Plan:    OSA>  He uses his CPAP compliantly w/o problems or daytime hypersomnolence...  HBP>  Controlled on Coreg & Losartan; continue same...  CAD>  followed by DrCooper & most recent [place] w/ reduced LVF, Cath 1/16- his notes are reviewed...  HYPERLIPID>  Stable on Lip20, Fish Oil, etc; needs better low fat diet, he has DrCooper check his labs...  GI> HH, Hx stricture, s/p removal of duod adenoma>  Followed by DrJacobs...  GI> Hx colon polyps>  F/u colon done 1/16 by DrJacobs...  GU> BPH, Low-T, ED>  Followed & managed by DrTannenbaum on Androgel...  DJD- knees and LBP>  He has seen  DrDeveshwar for Rheum  & DrCram for NS...    Patient's  Medications  New Prescriptions   No medications on file  Previous Medications   ACETAMINOPHEN (TYLENOL) 500 MG TABLET    Take 500 mg by mouth every 6 (six) hours as needed for moderate pain.   ANDROGEL PUMP 20.25 MG/ACT (1.62%) GEL    Apply 2 Act topically daily.    ASPIRIN EC 81 MG TABLET    Take 81 mg by mouth daily.   ATORVASTATIN (LIPITOR) 20 MG TABLET    TAKE 1 BY MOUTH DAILY WITH SUPPER   B COMPLEX VITAMINS TABLET    Take 1 tablet by mouth daily.    CARVEDILOL (COREG) 12.5 MG TABLET    TAKE 1 BY MOUTH TWICE DAILY WITH A MEAL   COENZYME Q10 (CO Q 10) 100 MG CAPS    Take 1 capsule by mouth daily.    FISH OIL-OMEGA-3 FATTY ACIDS 1000 MG CAPSULE    Take 1 g by mouth daily.    FOLIC ACID (FOLVITE) 840 MCG TABLET    Take 400 mcg by mouth daily.    GLUCOSAMINE-CHONDROITIN 1500-1200 MG/30ML LIQD    Take 1 tablet by mouth daily.    HYDROXYPROPYL METHYLCELLULOSE / HYPROMELLOSE (ISOPTO TEARS / GONIOVISC) 2.5 % OPHTHALMIC SOLUTION    Place 1 drop into both eyes daily as needed for dry eyes.   LOSARTAN (COZAAR) 100 MG TABLET    TAKE 1 BY MOUTH DAILY WITH LUNCH   MULTIPLE VITAMIN (MULTIVITAMIN) CAPSULE    Take 1 capsule by mouth daily.     MULTIVITAMIN-LUTEIN (OCUVITE-LUTEIN) CAPS CAPSULE    Take 1 capsule by mouth daily.   NON FORMULARY    CPAP machine   OXYCODONE-ACETAMINOPHEN (PERCOCET/ROXICET) 5-325 MG PER TABLET    Take 1-2 tablets by mouth every 4 (four) hours as needed for moderate pain.   PROBIOTIC PRODUCT (PROBIOTIC DAILY PO)    Take 1 capsule by mouth daily.    TRAMADOL (ULTRAM) 50 MG TABLET    Take 50 mg by mouth 3 (three) times daily as needed for pain.    TURMERIC PO    Take 1 tablet by mouth daily.   Modified Medications   Modified Medication Previous Medication   PANTOPRAZOLE (PROTONIX) 40 MG TABLET pantoprazole (PROTONIX) 40 MG tablet      TAKE  1 Tab BY MOUTH DAILY BEFORE BREAKFAST    TAKE 1 BY MOUTH DAILY BEFORE BREAKFAST    TAMSULOSIN (FLOMAX) 0.4 MG CAPS CAPSULE tamsulosin (FLOMAX) 0.4 MG CAPS capsule      Take 1 capsule (0.4 mg total) by mouth daily.    Take 1 capsule (0.4 mg total) by mouth daily.  Discontinued Medications   AZITHROMYCIN (ZITHROMAX Z-PAK) 250 MG TABLET    Use as directed

## 2014-11-29 NOTE — Patient Instructions (Signed)
Today we updated your med list in our EPIC system...    Continue your current medications the same...  Today we wrote new prescriptions for ZPak & Tamsulosin per your request...  We also gave you a TDAP (combination Tetanus shot) & Prevnar-13 pneumonia vaccination...  We will arrange for an ENT evaluation of your throat & vocal cords...  Call for any questions...  Let's plan a follow up visit in 43yr, sooner if needed for problems... '

## 2014-12-05 ENCOUNTER — Other Ambulatory Visit: Payer: Self-pay | Admitting: *Deleted

## 2014-12-05 DIAGNOSIS — R262 Difficulty in walking, not elsewhere classified: Secondary | ICD-10-CM | POA: Diagnosis not present

## 2014-12-05 DIAGNOSIS — M6281 Muscle weakness (generalized): Secondary | ICD-10-CM | POA: Diagnosis not present

## 2014-12-05 DIAGNOSIS — M5137 Other intervertebral disc degeneration, lumbosacral region: Secondary | ICD-10-CM | POA: Diagnosis not present

## 2014-12-05 DIAGNOSIS — M545 Low back pain: Secondary | ICD-10-CM | POA: Diagnosis not present

## 2014-12-05 MED ORDER — PANTOPRAZOLE SODIUM 40 MG PO TBEC: DELAYED_RELEASE_TABLET | ORAL | Status: DC

## 2014-12-21 DIAGNOSIS — J387 Other diseases of larynx: Secondary | ICD-10-CM | POA: Diagnosis not present

## 2014-12-21 DIAGNOSIS — R499 Unspecified voice and resonance disorder: Secondary | ICD-10-CM | POA: Diagnosis not present

## 2014-12-27 DIAGNOSIS — L723 Sebaceous cyst: Secondary | ICD-10-CM | POA: Diagnosis not present

## 2014-12-27 DIAGNOSIS — L570 Actinic keratosis: Secondary | ICD-10-CM | POA: Diagnosis not present

## 2014-12-27 DIAGNOSIS — L720 Epidermal cyst: Secondary | ICD-10-CM | POA: Diagnosis not present

## 2014-12-27 DIAGNOSIS — L738 Other specified follicular disorders: Secondary | ICD-10-CM | POA: Diagnosis not present

## 2014-12-27 DIAGNOSIS — L821 Other seborrheic keratosis: Secondary | ICD-10-CM | POA: Diagnosis not present

## 2015-01-12 DIAGNOSIS — M170 Bilateral primary osteoarthritis of knee: Secondary | ICD-10-CM | POA: Diagnosis not present

## 2015-01-12 DIAGNOSIS — M5137 Other intervertebral disc degeneration, lumbosacral region: Secondary | ICD-10-CM | POA: Diagnosis not present

## 2015-01-12 DIAGNOSIS — M19041 Primary osteoarthritis, right hand: Secondary | ICD-10-CM | POA: Diagnosis not present

## 2015-01-12 DIAGNOSIS — Z79899 Other long term (current) drug therapy: Secondary | ICD-10-CM | POA: Diagnosis not present

## 2015-01-12 DIAGNOSIS — E550 Rickets, active: Secondary | ICD-10-CM | POA: Diagnosis not present

## 2015-01-12 DIAGNOSIS — M19071 Primary osteoarthritis, right ankle and foot: Secondary | ICD-10-CM | POA: Diagnosis not present

## 2015-02-15 DIAGNOSIS — E291 Testicular hypofunction: Secondary | ICD-10-CM | POA: Diagnosis not present

## 2015-02-20 ENCOUNTER — Other Ambulatory Visit (INDEPENDENT_AMBULATORY_CARE_PROVIDER_SITE_OTHER): Payer: Medicare Other | Admitting: *Deleted

## 2015-02-20 DIAGNOSIS — I25118 Atherosclerotic heart disease of native coronary artery with other forms of angina pectoris: Secondary | ICD-10-CM | POA: Diagnosis not present

## 2015-02-20 DIAGNOSIS — E785 Hyperlipidemia, unspecified: Secondary | ICD-10-CM

## 2015-02-20 LAB — HEPATIC FUNCTION PANEL
ALBUMIN: 4.1 g/dL (ref 3.5–5.2)
ALT: 15 U/L (ref 0–53)
AST: 20 U/L (ref 0–37)
Alkaline Phosphatase: 72 U/L (ref 39–117)
Bilirubin, Direct: 0.1 mg/dL (ref 0.0–0.3)
TOTAL PROTEIN: 6.8 g/dL (ref 6.0–8.3)
Total Bilirubin: 0.7 mg/dL (ref 0.2–1.2)

## 2015-02-20 LAB — LIPID PANEL
CHOL/HDL RATIO: 4
Cholesterol: 121 mg/dL (ref 0–200)
HDL: 31.7 mg/dL — AB (ref >39.00)
LDL CALC: 58 mg/dL (ref 0–99)
NonHDL: 89.51
TRIGLYCERIDES: 160 mg/dL — AB (ref 0.0–149.0)
VLDL: 32 mg/dL (ref 0.0–40.0)

## 2015-02-21 DIAGNOSIS — R499 Unspecified voice and resonance disorder: Secondary | ICD-10-CM | POA: Diagnosis not present

## 2015-02-22 DIAGNOSIS — N400 Enlarged prostate without lower urinary tract symptoms: Secondary | ICD-10-CM | POA: Diagnosis not present

## 2015-02-22 DIAGNOSIS — E291 Testicular hypofunction: Secondary | ICD-10-CM | POA: Diagnosis not present

## 2015-02-26 NOTE — Progress Notes (Signed)
Cardiology Office Note Date:  02/27/2015   ID:  [name], DOB 08-Aug-1945, MRN [id number]  PCP:  [name], MD  Cardiologist:  [name], MD    Chief Complaint  Patient presents with  . Shortness of Breath    History of Present Illness: [name] is a 69 y.o. male who presents for follow-up evaluation. He has been followed for CAD with previous stenting of the RCA without recurrent ischemic events. He has also been followed for LBBB, HTN, and chronic dyspnea.   The patient reports no change in symptoms. He reports dyspnea with one flight of stairs or walking up a grade. No symptoms on level ground. No orthopnea, PND, chest pain, or chest pressure. He denies heart palpitations. He reports no change in medications.  Past Medical History  Diagnosis Date  . HTN (hypertension)   . Hyperlipidemia   . Allergic rhinitis   . Hemoptysis   . Hiatal hernia   . GERD (gastroesophageal reflux disease)   . Esophageal stricture   . 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 History of colonic polyps     hyperplastic  . Hypertrophy of prostate with urinary obstruction and other lower urinary tract symptoms (LUTS)   . Testicular hypofunction   . OA (osteoarthritis)   . Chronic back pain     intermittent  . Dizziness   . CHF (congestive heart failure) ([place])   . Dysrhythmia     right bundle branch block   . Shortness of breath     with exertion  . OSA (obstructive sleep apnea)     cpap- 10   . CAD (coronary artery disease)     hx of stent- 2005 RCA  . Anginal pain ([place]) 05/26/14    chest pain after chasing dog  . Constipation     Past Surgical History  Procedure Laterality Date  . S/p right knee arthroscopy  2005  . Rca stenting      '05 RCA  . Hernia surgery x 3      Bilateral Inguinal, Umbicial  . Oral surg to removed growth from ?sinus  10/2010    ?Dermoid removed by  DrRiggs  . Colonoscopy  12/2007    HYPERPLASTIC POLYP  . Upper gastrointestinal endoscopy    . Right toe surgery  Right     Cyst   . Irrigation and debridement abscess Left 08/14/2012    Procedure: IRRIGATION AND DEBRIDEMENT LEFT INGUINAL BOIL ;  Surgeon: Ailene Rud, MD;  Location: [facility];  Service: Urology;  Laterality: Left;  . Cardiac catheterization      '05, last 2009, showing patent RCA stent  . Left heart catheterization with coronary angiogram N/A 05/26/2014    Procedure: LEFT HEART CATHETERIZATION WITH CORONARY ANGIOGRAM;  Surgeon: Blane Ohara, MD;  Location: [facility] CATH LAB;  Service: Cardiovascular;  Laterality: N/A;  . Colonoscopy with propofol N/A 06/02/2014    Procedure: COLONOSCOPY WITH PROPOFOL;  Surgeon: [name], MD;  Location: [facility] ENDOSCOPY;  Service: Endoscopy;  Laterality: N/A;  . Esophagogastroduodenoscopy (egd) with propofol N/A 06/02/2014    Procedure: ESOPHAGOGASTRODUODENOSCOPY (EGD) WITH PROPOFOL;  Surgeon: [name], MD;  Location: [facility] ENDOS[facility];  Service: Endoscopy;  Laterality: N/A;  . Coronary angioplasty  08/2003  . Lumbar laminectomy/decompression microdiscectomy Left 09/26/2014    Procedure: Lumbar Laminectomy for resection of synovial cyst  Lumbar five- sacral one left;  Surgeon: Kary Kos, MD;  Location: [facility] NEURO ORS;  Service: Neurosurgery;  Laterality: Left;    Current Outpatient Prescriptions  Medication Sig Dispense Refill  . acetaminophen (TYLENOL) 500 MG tablet Take 500 mg by mouth every 6 (six) hours as needed for moderate pain.    . ANDROGEL PUMP 20.25 MG/ACT (1.62%) GEL Apply 2 Act topically daily.     Marland Kitchen aspirin EC 81 MG tablet Take 81 mg by mouth daily.    Marland Kitchen atorvastatin (LIPITOR) 20 MG tablet TAKE 1 BY MOUTH DAILY WITH SUPPER 90 tablet 3  . b complex vitamins tablet Take 1 tablet by mouth daily.     . carvedilol (COREG) 12.5 MG tablet TAKE 1 BY MOUTH TWICE DAILY WITH A MEAL 180 tablet 3  . Coenzyme Q10 (CO Q 10) 100 MG CAPS Take  1 capsule by mouth daily.     . fish oil-omega-3 fatty acids 1000 MG capsule Take 1 g by mouth daily.     . folic acid (FOLVITE) 161 MCG tablet Take 400 mcg by mouth daily.     . Glucosamine-Chondroitin 1500-1200 MG/30ML LIQD Take 1 tablet by mouth daily.     . hydroxypropyl methylcellulose / hypromellose (ISOPTO TEARS / GONIOVISC) 2.5 % ophthalmic solution Place 1 drop into both eyes daily as needed for dry eyes.    Marland Kitchen losartan (COZAAR) 100 MG tablet TAKE 1 BY MOUTH DAILY WITH LUNCH 90 tablet 3  . Multiple Vitamin (MULTIVITAMIN) capsule Take 1 capsule by mouth daily.      . multivitamin-lutein (OCUVITE-LUTEIN) CAPS capsule Take 1 capsule by mouth daily.    . NON FORMULARY CPAP machine    . oxyCODONE-acetaminophen (PERCOCET/ROXICET) 5-325 MG per tablet Take 1-2 tablets by mouth every 4 (four) hours as needed for moderate pain. 60 tablet 0  . pantoprazole (PROTONIX) 40 MG tablet TAKE  1 Tab BY MOUTH DAILY BEFORE BREAKFAST 90 tablet 1  . Probiotic Product (PROBIOTIC DAILY PO) Take 1 capsule by mouth daily.     . tamsulosin (FLOMAX) 0.4 MG CAPS capsule Take 1 capsule (0.4 mg total) by mouth daily. 90 capsule 1  . traMADol (ULTRAM) 50 MG tablet Take 50 mg by mouth 3 (three) times daily as needed for pain.     . TURMERIC PO Take 1 tablet by mouth daily.      No current facility-administered medications for this visit.    Allergies:   Sulfamethoxazole-trimethoprim and Sulfonamide derivatives   Social History:  The patient  reports that he quit smoking about 24 years ago. He has never used smokeless tobacco. He reports that he does not drink alcohol or use illicit drugs.   Family History:  The patient's family history includes Coronary artery disease in his father; Diabetes in his father; Heart disease in his father and mother; Hypertension in an other family member; Stomach cancer in his paternal grandmother; Sudden death in his father. There is no history of Colon cancer or Esophageal cancer.     ROS:  Please see the history of present illness.  Otherwise, review of systems is positive for leg swelling, shortness of breath with activity, back pain.  All other systems are reviewed and negative.    PHYSICAL EXAM: VS:  BP 110/70 mmHg  Pulse 71  Ht 5\' 8"  (1.727 m)  Wt 182 lb 12.8 oz (82.918 kg)  BMI 27.80 kg/m2 , BMI Body mass index is 27.8 kg/(m^2). GEN: Well nourished, well developed, in no acute distress HEENT: normal Neck: no JVD, no  masses. No carotid bruits Cardiac: RRR without murmur or gallop                Respiratory:  clear to auscultation bilaterally, normal work of breathing GI: soft, nontender, nondistended, + BS MS: no deformity or atrophy Ext: no pretibial edema, pedal pulses 2+= bilaterally Skin: warm and dry, no rash Neuro:  Strength and sensation are intact Psych: euthymic mood, full affect  EKG:  EKG is ordered today. The ekg ordered today shows Sinus rhythm with first degree AV block, LBBB  Recent Labs: 09/26/2014: BUN 12; Creatinine, Ser 0.83; Hemoglobin 13.2; Platelets 219; Potassium 3.9; Sodium 139 02/20/2015: ALT 15   Lipid Panel     Component Value Date/Time   CHOL 121 02/20/2015 0823   TRIG 160.0* 02/20/2015 0823   HDL 31.70* 02/20/2015 0823   CHOLHDL 4 02/20/2015 0823   VLDL 32.0 02/20/2015 0823   LDLCALC 58 02/20/2015 0823      Wt Readings from Last 3 Encounters:  02/27/15 182 lb 12.8 oz (82.918 kg)  11/29/14 184 lb 12.8 oz (83.825 kg)  09/26/14 178 lb (80.74 kg)     Cardiac Studies Reviewed: Cardiac Cath January 2016: Final Conclusions:  1. Patent coronary arteries with continued patency of the stented segment in the RCA and minor nonobstructive stenosis of the LAD. The left main and left circumflex have no stenosis.  2. Mild to moderate segmental LV systolic dysfunction with normal LVEDP  Recommendations: Continue medical management. Suspect noncardiac chest pain.  ASSESSMENT AND PLAN: 1.  CAD, native vessel, without  angina: Stable on current medical therapy. No changes advised today. He is on aspirin, a statin drug, and a beta blocker.  2. Nonischemic cardiomyopathy: LVEF has been as low as 30% on echo and was 40% by ventriculography. Last echocardiogram was 3 years ago. This is to be related and an echocardiogram was ordered. He has New York Heart Association functional class II symptoms.  3. Essential Hypertension: Blood pressure is well controlled on a combination of losartan and carvedilol.  4. Hyperlipidemia: recent lipids reviewed as above. LDL 58 at goal.   Current medicines are reviewed with the patient today.  The patient does not have concerns regarding medicines.  Labs/ tests ordered today include:   Orders Placed This Encounter  Procedures  . EKG 12-Lead  . Echocardiogram    Disposition:   FU 6 months  Signed, [name], MD  02/27/2015 10:26 AM    [facility] [place], [place], [place]  [address] Phone: [phone]; Fax: [phone]

## 2015-02-27 ENCOUNTER — Encounter: Payer: Self-pay | Admitting: Cardiovascular Disease

## 2015-02-27 ENCOUNTER — Ambulatory Visit (INDEPENDENT_AMBULATORY_CARE_PROVIDER_SITE_OTHER): Payer: Medicare Other | Admitting: Cardiovascular Disease

## 2015-02-27 VITALS — BP 110/70 | HR 71 | Ht 68.0 in | Wt 182.8 lb

## 2015-02-27 DIAGNOSIS — I429 Cardiomyopathy, unspecified: Secondary | ICD-10-CM | POA: Diagnosis not present

## 2015-02-27 DIAGNOSIS — I10 Essential (primary) hypertension: Secondary | ICD-10-CM | POA: Diagnosis not present

## 2015-02-27 DIAGNOSIS — I25119 Atherosclerotic heart disease of native coronary artery with unspecified angina pectoris: Secondary | ICD-10-CM

## 2015-02-27 DIAGNOSIS — I428 Other cardiomyopathies: Secondary | ICD-10-CM

## 2015-02-27 NOTE — Patient Instructions (Signed)
Medication Instructions:  Your physician recommends that you continue on your current medications as directed. Please refer to the Current Medication list given to you today.  Labwork: No new orders.   Testing/Procedures: Your physician has requested that you have an echocardiogram. Echocardiography is a painless test that uses sound waves to create images of your heart. It provides your doctor with information about the size and shape of your heart and how well your heart's chambers and valves are working. This procedure takes approximately one hour. There are no restrictions for this procedure.  Follow-Up: Your physician wants you to follow-up in: 6 MONTHS with Dr Burt Knack.  You will receive a reminder letter in the mail two months in advance. If you don't receive a letter, please call our office to schedule the follow-up appointment.   Any Other Special Instructions Will Be Listed Below (If Applicable).

## 2015-03-01 ENCOUNTER — Encounter: Payer: Self-pay | Admitting: Rheumatology

## 2015-03-01 DIAGNOSIS — M8589 Other specified disorders of bone density and structure, multiple sites: Secondary | ICD-10-CM | POA: Diagnosis not present

## 2015-03-01 DIAGNOSIS — Z13820 Encounter for screening for osteoporosis: Secondary | ICD-10-CM | POA: Diagnosis not present

## 2015-03-08 ENCOUNTER — Ambulatory Visit (HOSPITAL_COMMUNITY): Payer: Medicare Other | Attending: Cardiology

## 2015-03-08 ENCOUNTER — Other Ambulatory Visit: Payer: Self-pay

## 2015-03-08 DIAGNOSIS — I428 Other cardiomyopathies: Secondary | ICD-10-CM

## 2015-03-08 DIAGNOSIS — I429 Cardiomyopathy, unspecified: Secondary | ICD-10-CM | POA: Diagnosis not present

## 2015-03-08 DIAGNOSIS — I517 Cardiomegaly: Secondary | ICD-10-CM | POA: Diagnosis not present

## 2015-03-08 DIAGNOSIS — I340 Nonrheumatic mitral (valve) insufficiency: Secondary | ICD-10-CM | POA: Diagnosis not present

## 2015-03-08 DIAGNOSIS — I351 Nonrheumatic aortic (valve) insufficiency: Secondary | ICD-10-CM | POA: Diagnosis not present

## 2015-03-08 DIAGNOSIS — I10 Essential (primary) hypertension: Secondary | ICD-10-CM | POA: Diagnosis not present

## 2015-03-09 DIAGNOSIS — M170 Bilateral primary osteoarthritis of knee: Secondary | ICD-10-CM | POA: Diagnosis not present

## 2015-03-09 DIAGNOSIS — M25562 Pain in left knee: Secondary | ICD-10-CM | POA: Diagnosis not present

## 2015-03-09 DIAGNOSIS — M25561 Pain in right knee: Secondary | ICD-10-CM | POA: Diagnosis not present

## 2015-03-09 DIAGNOSIS — R262 Difficulty in walking, not elsewhere classified: Secondary | ICD-10-CM | POA: Diagnosis not present

## 2015-03-13 DIAGNOSIS — M1712 Unilateral primary osteoarthritis, left knee: Secondary | ICD-10-CM | POA: Diagnosis not present

## 2015-03-13 DIAGNOSIS — M25562 Pain in left knee: Secondary | ICD-10-CM | POA: Diagnosis not present

## 2015-03-15 DIAGNOSIS — M25561 Pain in right knee: Secondary | ICD-10-CM | POA: Diagnosis not present

## 2015-03-15 DIAGNOSIS — M1711 Unilateral primary osteoarthritis, right knee: Secondary | ICD-10-CM | POA: Diagnosis not present

## 2015-03-16 ENCOUNTER — Telehealth: Payer: Self-pay | Admitting: Cardiovascular Disease

## 2015-03-16 NOTE — Telephone Encounter (Signed)
Per pt call   Pt needs call back on Echo done 2 wks ago please.

## 2015-03-21 NOTE — Telephone Encounter (Signed)
F/u    Pt returning Lauren's phone call for echo results. Please call back.

## 2015-03-21 NOTE — Telephone Encounter (Signed)
Spoke with pt and reviewed echo results with him.  

## 2015-03-23 DIAGNOSIS — M25562 Pain in left knee: Secondary | ICD-10-CM | POA: Diagnosis not present

## 2015-03-23 DIAGNOSIS — M1712 Unilateral primary osteoarthritis, left knee: Secondary | ICD-10-CM | POA: Diagnosis not present

## 2015-03-28 DIAGNOSIS — M170 Bilateral primary osteoarthritis of knee: Secondary | ICD-10-CM | POA: Diagnosis not present

## 2015-03-28 DIAGNOSIS — M1711 Unilateral primary osteoarthritis, right knee: Secondary | ICD-10-CM | POA: Diagnosis not present

## 2015-03-28 DIAGNOSIS — M25561 Pain in right knee: Secondary | ICD-10-CM | POA: Diagnosis not present

## 2015-03-30 DIAGNOSIS — M25562 Pain in left knee: Secondary | ICD-10-CM | POA: Diagnosis not present

## 2015-03-30 DIAGNOSIS — M1712 Unilateral primary osteoarthritis, left knee: Secondary | ICD-10-CM | POA: Diagnosis not present

## 2015-04-03 DIAGNOSIS — M25561 Pain in right knee: Secondary | ICD-10-CM | POA: Diagnosis not present

## 2015-04-03 DIAGNOSIS — M1711 Unilateral primary osteoarthritis, right knee: Secondary | ICD-10-CM | POA: Diagnosis not present

## 2015-04-05 DIAGNOSIS — M25562 Pain in left knee: Secondary | ICD-10-CM | POA: Diagnosis not present

## 2015-04-05 DIAGNOSIS — M1712 Unilateral primary osteoarthritis, left knee: Secondary | ICD-10-CM | POA: Diagnosis not present

## 2015-04-06 DIAGNOSIS — M1711 Unilateral primary osteoarthritis, right knee: Secondary | ICD-10-CM | POA: Diagnosis not present

## 2015-04-06 DIAGNOSIS — M25561 Pain in right knee: Secondary | ICD-10-CM | POA: Diagnosis not present

## 2015-04-20 DIAGNOSIS — Z23 Encounter for immunization: Secondary | ICD-10-CM | POA: Diagnosis not present

## 2015-05-03 DIAGNOSIS — Z5181 Encounter for therapeutic drug level monitoring: Secondary | ICD-10-CM | POA: Diagnosis not present

## 2015-05-21 HISTORY — PX: JOINT REPLACEMENT: SHX530

## 2015-06-06 ENCOUNTER — Other Ambulatory Visit: Payer: Self-pay | Admitting: Cardiovascular Disease

## 2015-06-29 DIAGNOSIS — L812 Freckles: Secondary | ICD-10-CM | POA: Diagnosis not present

## 2015-06-29 DIAGNOSIS — D1801 Hemangioma of skin and subcutaneous tissue: Secondary | ICD-10-CM | POA: Diagnosis not present

## 2015-06-29 DIAGNOSIS — L821 Other seborrheic keratosis: Secondary | ICD-10-CM | POA: Diagnosis not present

## 2015-07-11 DIAGNOSIS — M170 Bilateral primary osteoarthritis of knee: Secondary | ICD-10-CM | POA: Diagnosis not present

## 2015-07-11 DIAGNOSIS — M19271 Secondary osteoarthritis, right ankle and foot: Secondary | ICD-10-CM | POA: Diagnosis not present

## 2015-07-11 DIAGNOSIS — M8589 Other specified disorders of bone density and structure, multiple sites: Secondary | ICD-10-CM | POA: Diagnosis not present

## 2015-07-11 DIAGNOSIS — M19241 Secondary osteoarthritis, right hand: Secondary | ICD-10-CM | POA: Diagnosis not present

## 2015-07-31 ENCOUNTER — Other Ambulatory Visit: Payer: Self-pay | Admitting: *Deleted

## 2015-07-31 MED ORDER — CARVEDILOL 12.5 MG PO TABS: 12.5000 mg | ORAL_TABLET | Freq: Two times a day (BID) | ORAL | Status: DC

## 2015-07-31 MED ORDER — ATORVASTATIN CALCIUM 20 MG PO TABS: 20.0000 mg | ORAL_TABLET | Freq: Every day | ORAL | Status: DC

## 2015-07-31 MED ORDER — LOSARTAN POTASSIUM 100 MG PO TABS: 100.0000 mg | ORAL_TABLET | Freq: Every day | ORAL | Status: DC

## 2015-08-09 DIAGNOSIS — E291 Testicular hypofunction: Secondary | ICD-10-CM | POA: Diagnosis not present

## 2015-08-21 DIAGNOSIS — N138 Other obstructive and reflux uropathy: Secondary | ICD-10-CM | POA: Diagnosis not present

## 2015-08-21 DIAGNOSIS — E291 Testicular hypofunction: Secondary | ICD-10-CM | POA: Diagnosis not present

## 2015-08-21 DIAGNOSIS — N401 Enlarged prostate with lower urinary tract symptoms: Secondary | ICD-10-CM | POA: Diagnosis not present

## 2015-08-21 DIAGNOSIS — Z0000 Encounter for general adult medical examination without abnormal findings: Secondary | ICD-10-CM | POA: Diagnosis not present

## 2015-08-23 DIAGNOSIS — K119 Disease of salivary gland, unspecified: Secondary | ICD-10-CM | POA: Diagnosis not present

## 2015-08-23 DIAGNOSIS — K118 Other diseases of salivary glands: Secondary | ICD-10-CM | POA: Insufficient documentation

## 2015-08-23 DIAGNOSIS — M542 Cervicalgia: Secondary | ICD-10-CM | POA: Diagnosis not present

## 2015-08-23 DIAGNOSIS — R220 Localized swelling, mass and lump, head: Secondary | ICD-10-CM | POA: Diagnosis not present

## 2015-08-24 ENCOUNTER — Other Ambulatory Visit: Payer: Self-pay | Admitting: Otolaryngology

## 2015-08-24 DIAGNOSIS — M542 Cervicalgia: Secondary | ICD-10-CM

## 2015-08-31 ENCOUNTER — Ambulatory Visit
Admission: RE | Admit: 2015-08-31 | Discharge: 2015-08-31 | Disposition: A | Discharge status: Home / Self Care | Payer: Medicare Other | Source: Ambulatory Visit | Attending: Otolaryngology | Admitting: Otolaryngology

## 2015-08-31 DIAGNOSIS — M542 Cervicalgia: Secondary | ICD-10-CM

## 2015-08-31 MED ORDER — IOPAMIDOL (ISOVUE-300) INJECTION 61%
75.0000 mL | Freq: Once | INTRAVENOUS | Status: AC | PRN
  Administered 2015-08-31: 75 mL via INTRAVENOUS

## 2015-09-27 DIAGNOSIS — H35033 Hypertensive retinopathy, bilateral: Secondary | ICD-10-CM | POA: Diagnosis not present

## 2015-09-27 DIAGNOSIS — H5203 Hypermetropia, bilateral: Secondary | ICD-10-CM | POA: Diagnosis not present

## 2015-09-29 DIAGNOSIS — Z79899 Other long term (current) drug therapy: Secondary | ICD-10-CM | POA: Diagnosis not present

## 2015-10-04 ENCOUNTER — Telehealth: Payer: Self-pay | Admitting: Pulmonary Disease

## 2015-10-04 MED ORDER — LEVOFLOXACIN 500 MG PO TABS: 500.0000 mg | ORAL_TABLET | Freq: Every day | ORAL | Status: DC

## 2015-10-04 NOTE — Telephone Encounter (Signed)
Spoke with pt. Reports dry cough for the past 4 days. SOB, chest tightness, wheezing are also present. Has been running a low grade fever. Has taken a few OTC sinus/cough meds with minimal relief. Would like to have something called in.  Allergies  Allergen Reactions  . Sulfamethoxazole-Trimethoprim Rash       . Sulfonamide Derivatives Rash        SN - please advise. Thanks.

## 2015-10-04 NOTE — Telephone Encounter (Signed)
Per SN >> Levaquin 500mg  1 daily for 7 days #7, Take OTC Align while on antibiotic.  Spoke with pt. He is aware of SN's recommendation. Rx has been sent in. Nothing further was needed.

## 2015-10-05 ENCOUNTER — Other Ambulatory Visit: Payer: Self-pay | Admitting: Neurosurgery

## 2015-10-05 DIAGNOSIS — M5023 Other cervical disc displacement, cervicothoracic region: Secondary | ICD-10-CM | POA: Diagnosis not present

## 2015-10-05 DIAGNOSIS — M5136 Other intervertebral disc degeneration, lumbar region: Secondary | ICD-10-CM | POA: Diagnosis not present

## 2015-10-05 DIAGNOSIS — M542 Cervicalgia: Secondary | ICD-10-CM | POA: Diagnosis not present

## 2015-10-05 DIAGNOSIS — M5137 Other intervertebral disc degeneration, lumbosacral region: Secondary | ICD-10-CM | POA: Diagnosis not present

## 2015-10-05 DIAGNOSIS — Z6828 Body mass index (BMI) 28.0-28.9, adult: Secondary | ICD-10-CM | POA: Diagnosis not present

## 2015-10-05 DIAGNOSIS — M5030 Other cervical disc degeneration, unspecified cervical region: Secondary | ICD-10-CM | POA: Insufficient documentation

## 2015-10-06 ENCOUNTER — Other Ambulatory Visit: Payer: Self-pay | Admitting: Pulmonary Disease

## 2015-10-06 ENCOUNTER — Telehealth: Payer: Self-pay | Admitting: Pulmonary Disease

## 2015-10-06 NOTE — Telephone Encounter (Signed)
Pt was given Levaquin by SN on 10/04/15. He is out of town and left his antibiotic at home. Jacqlyn Larsen would like a verbal to fill #2 for the pt while he is out of town. Verbal has been given. Nothing further was needed.

## 2015-10-09 DIAGNOSIS — M25561 Pain in right knee: Secondary | ICD-10-CM | POA: Diagnosis not present

## 2015-10-09 DIAGNOSIS — M2241 Chondromalacia patellae, right knee: Secondary | ICD-10-CM | POA: Diagnosis not present

## 2015-10-09 DIAGNOSIS — G8929 Other chronic pain: Secondary | ICD-10-CM | POA: Diagnosis not present

## 2015-10-09 DIAGNOSIS — M1711 Unilateral primary osteoarthritis, right knee: Secondary | ICD-10-CM | POA: Diagnosis not present

## 2015-10-09 NOTE — Telephone Encounter (Signed)
If pt is sick, he needs to contact the office.

## 2015-10-14 ENCOUNTER — Ambulatory Visit
Admission: RE | Admit: 2015-10-14 | Discharge: 2015-10-14 | Disposition: A | Discharge status: Home / Self Care | Payer: Medicare Other | Source: Ambulatory Visit | Attending: Neurosurgery | Admitting: Neurosurgery

## 2015-10-14 DIAGNOSIS — M50223 Other cervical disc displacement at C6-C7 level: Secondary | ICD-10-CM | POA: Diagnosis not present

## 2015-10-14 DIAGNOSIS — M5023 Other cervical disc displacement, cervicothoracic region: Secondary | ICD-10-CM

## 2015-10-14 DIAGNOSIS — M5136 Other intervertebral disc degeneration, lumbar region: Secondary | ICD-10-CM

## 2015-10-14 DIAGNOSIS — M5126 Other intervertebral disc displacement, lumbar region: Secondary | ICD-10-CM | POA: Diagnosis not present

## 2015-10-14 MED ORDER — GADOBENATE DIMEGLUMINE 529 MG/ML IV SOLN
18.0000 mL | Freq: Once | INTRAVENOUS | Status: AC | PRN
  Administered 2015-10-14: 18 mL via INTRAVENOUS

## 2015-10-18 DIAGNOSIS — M1712 Unilateral primary osteoarthritis, left knee: Secondary | ICD-10-CM | POA: Diagnosis not present

## 2015-11-03 DIAGNOSIS — N400 Enlarged prostate without lower urinary tract symptoms: Secondary | ICD-10-CM | POA: Diagnosis not present

## 2015-11-05 NOTE — Progress Notes (Signed)
Cardiology Office Note Date:  11/06/2015   ID:  [name], DOB 10/12/1945, MRN [id number]  PCP:  [name], MD  Cardiologist:  [name], MD    Chief Complaint  Patient presents with  . Shortness of Breath   History of Present Illness: [name] is a 70 y.o. male who presents for follow-up evaluation. He has been followed for CAD with previous stenting of the RCA without recurrent ischemic events. He has also been followed for LBBB, HTN, and chronic dyspnea. He was last seen in October 2016.  The patient denies any new cardiac symptoms. Unfortunately he has developed a lot of problems with his knees. He has become quite limited especially with right knee pain. He can't even use a recumbent bike for exercise because of pain in the knee. He is doing less walking than in the past. He did recently take a trip to [place] with his 59 year old grandson and had a good time, but at times says that he had 9/10 knee pain. His chronic exertional dyspnea has not changed at all. He denies orthopnea, PND, or leg swelling. He's had no chest pain or pressure. He plans on retiring in December 2018.   Past Medical History  Diagnosis Date  . HTN (hypertension)   . Hyperlipidemia   . Allergic rhinitis   . Hemoptysis   . Hiatal hernia   . GERD (gastroesophageal reflux disease)   . Esophageal stricture   . 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 History of colonic polyps     hyperplastic  . Hypertrophy of prostate with urinary obstruction and other lower urinary tract symptoms (LUTS)   . Testicular hypofunction   . OA (osteoarthritis)   . Chronic back pain     intermittent  . Dizziness   . CHF (congestive heart failure) ([place])   . Dysrhythmia     right bundle branch block   . Shortness of breath     with exertion  . OSA (obstructive sleep apnea)     cpap- 10   . CAD (coronary artery  disease)     hx of stent- 2005 RCA  . Anginal pain ([place]) 05/26/14    chest pain after chasing dog  . Constipation     Past Surgical History  Procedure Laterality Date  . S/p right knee arthroscopy  2005  . Rca stenting      '05 RCA  . Hernia surgery x 3      Bilateral Inguinal, Umbicial  . Oral surg to removed growth from ?sinus  10/2010    ?Dermoid removed by DrRiggs  . Colonoscopy  12/2007    HYPERPLASTIC POLYP  . Upper gastrointestinal endoscopy    . Right toe surgery  Right     Cyst   . Irrigation and debridement abscess Left 08/14/2012    Procedure: IRRIGATION AND DEBRIDEMENT LEFT INGUINAL BOIL ;  Surgeon: Ailene Rud, MD;  Location: [facility];  Service: Urology;  Laterality: Left;  . Cardiac catheterization      '05, last 2009, showing patent RCA stent  . Left heart catheterization with coronary angiogram N/A 05/26/2014    Procedure: LEFT HEART CATHETERIZATION WITH CORONARY ANGIOGRAM;  Surgeon: Blane Ohara, MD;  Location: [facility] CATH LAB;  Service: Cardiovascular;  Laterality: N/A;  . Colonoscopy with propofol N/A 06/02/2014    Procedure: COLONOSCOPY WITH PROPOFOL;  Surgeon: [name], MD;  Location: [facility] ENDOSCOPY;  Service: Endoscopy;  Laterality: N/A;  . Esophagogastroduodenoscopy (egd) with propofol N/A 06/02/2014    Procedure: ESOPHAGOGASTRODUODENOSCOPY (EGD) WITH PROPOFOL;  Surgeon: [name], MD;  Location: [facility] END[facility];  Service: Endoscopy;  Laterality: N/A;  . Coronary angioplasty  08/2003  . Lumbar laminectomy/decompression microdiscectomy Left 09/26/2014    Procedure: Lumbar Laminectomy for resection of synovial cyst  Lumbar five- sacral one left;  Surgeon: Kary Kos, MD;  Location: [place] NEURO ORS;  Service: Neurosurgery;  Laterality: Left;    Current Outpatient Prescriptions  Medication Sig Dispense Refill  . acetaminophen (TYLENOL) 500 MG tablet Take 500 mg by mouth every 6 (six) hours as needed for moderate pain.    . ANDROGEL PUMP 20.25 MG/ACT (1.62%)  GEL Apply 2 Act topically daily.     Marland Kitchen aspirin EC 81 MG tablet Take 81 mg by mouth daily.    Marland Kitchen atorvastatin (LIPITOR) 20 MG tablet Take 1 tablet (20 mg total) by mouth daily at 6 PM. 90 tablet 3  . b complex vitamins tablet Take 1 tablet by mouth daily.     . carvedilol (COREG) 12.5 MG tablet Take 1 tablet (12.5 mg total) by mouth 2 (two) times daily with a meal. 180 tablet 3  . Coenzyme Q10 (CO Q 10) 100 MG CAPS Take 1 capsule by mouth daily.     . fish oil-omega-3 fatty acids 1000 MG capsule Take 1 g by mouth daily.     . folic acid (FOLVITE) A999333 MCG tablet Take 400 mcg by mouth daily.     . Glucosamine-Chondroitin 1500-1200 MG/30ML LIQD Take 1 tablet by mouth daily.     . hydroxypropyl methylcellulose / hypromellose (ISOPTO TEARS / GONIOVISC) 2.5 % ophthalmic solution Place 1 drop into both eyes daily as needed for dry eyes.    Marland Kitchen levofloxacin (LEVAQUIN) 500 MG tablet Take 1 tablet (500 mg total) by mouth daily. 7 tablet 0  . losartan (COZAAR) 100 MG tablet Take 1 tablet (100 mg total) by mouth daily. 90 tablet 3  . MAGNESIUM PO Take 1 tablet by mouth every Monday, Wednesday, Friday, and Saturday at 6 PM.    . Multiple Vitamin (MULTIVITAMIN) capsule Take 1 capsule by mouth daily.      . multivitamin-lutein (OCUVITE-LUTEIN) CAPS capsule Take 1 capsule by mouth daily.    . nabumetone (RELAFEN) 500 MG tablet Take 500 mg by mouth daily.    . NON FORMULARY CPAP machine    . oxyCODONE-acetaminophen (PERCOCET/ROXICET) 5-325 MG per tablet Take 1-2 tablets by mouth every 4 (four) hours as needed for moderate pain. 60 tablet 0  . pantoprazole (PROTONIX) 40 MG tablet TAKE 1 TAB BY MOUTH DAILY BEFORE BREAKFAST 90 tablet 1  . POTASSIUM PO Take 1 tablet by mouth every Monday, Wednesday, Friday, and Saturday at 6 PM.    . Probiotic Product (PROBIOTIC DAILY PO) Take 1 capsule by mouth daily.     . tamsulosin (FLOMAX) 0.4 MG CAPS capsule Take 1 capsule (0.4 mg total) by mouth daily. 90 capsule 1  . traMADol  (ULTRAM) 50 MG tablet Take 50 mg by mouth 3 (three) times daily as needed for pain.     . TURMERIC PO Take 1 tablet by mouth daily.      No current facility-administered medications for this visit.    Allergies:   Sulfamethoxazole-trimethoprim and Sulfonamide derivatives   Social History:  The patient  reports that he quit smoking about 25 years ago. He has never used smokeless  tobacco. He reports that he does not drink alcohol or use illicit drugs.   Family History:  The patient's  family history includes Coronary artery disease in his father; Diabetes in his father; Heart disease in his father and mother; Stomach cancer in his paternal grandmother; Sudden death in his father. There is no history of Colon cancer or Esophageal cancer.    ROS:  Please see the history of present illness.  Otherwise, review of systems is positive for snoring, muscle pain.  All other systems are reviewed and negative.    PHYSICAL EXAM: VS:  BP 122/68 mmHg  Pulse 74  Ht 5\' 7"  (1.702 m)  Wt 181 lb 6.4 oz (82.283 kg)  BMI 28.40 kg/m2  SpO2 97% , BMI Body mass index is 28.4 kg/(m^2). GEN: Well nourished, well developed, in no acute distress HEENT: normal Neck: no JVD, no masses. No carotid bruits Cardiac: RRR without murmur or gallop                Respiratory:  clear to auscultation bilaterally, normal work of breathing GI: soft, nontender, nondistended, + BS MS: no deformity or atrophy Ext: no pretibial edema, pedal pulses 2+= bilaterally Skin: warm and dry, no rash Neuro:  Strength and sensation are intact Psych: euthymic mood, full affect  EKG:  EKG is ordered today. The ekg ordered today shows normal sinus rhythm 74 bpm, left bundle branch block  Recent Labs: 02/20/2015: ALT 15   Lipid Panel     Component Value Date/Time   CHOL 121 02/20/2015 0823   TRIG 160.0* 02/20/2015 0823   HDL 31.70* 02/20/2015 0823   CHOLHDL 4 02/20/2015 0823   VLDL 32.0 02/20/2015 0823   LDLCALC 58 02/20/2015 0823       Wt Readings from Last 3 Encounters:  11/06/15 181 lb 6.4 oz (82.283 kg)  02/27/15 182 lb 12.8 oz (82.918 kg)  11/29/14 184 lb 12.8 oz (83.825 kg)     Cardiac Studies Reviewed: Cardiac Cath January 2016: Final Conclusions:  1. Patent coronary arteries with continued patency of the stented segment in the RCA and minor nonobstructive stenosis of the LAD. The left main and left circumflex have no stenosis.  2. Mild to moderate segmental LV systolic dysfunction with normal LVEDP  Recommendations: Continue medical management. Suspect noncardiac chest pain.  Echo 03-08-2015: Study Conclusions  - Left ventricle: The cavity size was normal. Wall thickness was  increased in a pattern of mild LVH. Systolic function was  moderately reduced. The estimated ejection fraction was in the  range of 35% to 40%. Septal-lateral dyssynchrony with diffuse  hypokinesis appearing worse in the septum. Doppler parameters are  consistent with abnormal left ventricular relaxation (grade 1  diastolic dysfunction). - Aortic valve: There was no stenosis. There was trivial  regurgitation. - Mitral valve: There was mild regurgitation. - Left atrium: The atrium was mildly dilated. - Right ventricle: The cavity size was normal. Systolic function  was normal. - Tricuspid valve: Peak RV-RA gradient (S): 23 mm Hg. - Pulmonary arteries: PA peak pressure: 26 mm Hg (S). - Inferior vena cava: The vessel was normal in size. The  respirophasic diameter changes were in the normal range (= 50%),  consistent with normal central venous pressure.  Impressions:  - Normal LV size with mild LV hypertrophy. EF 35-40% with  septal-lateral dyssynchrony and diffuse hypokinesis appearing  worse in the septum. Normal RV size and systolic function. Mild  MR. If ICD is being considered, could do cardiac MRI.  ASSESSMENT AND PLAN: 1.  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 These treated with a beta blocker and ARB. Will continue without change. I will see him back in 6 months. LVEF is 35-40%.  2. Coronary artery disease, native vessel: No symptoms of angina. He is treated with aspirin, a statin drug, and the beta blocker.  3. Essential hypertension: Blood pressure is well controlled on current therapies.  4. Hyperlipidemia: Last lipids reviewed as above. He continues on atorvastatin.  5. Knee pain: If he requires knee surgery or even knee replacement, he is at acceptable cardiac risk of proceeding. No preoperative testing is required. He can clearly achieve greater than 4 metabolic equivalents without symptoms.  6. Muscle cramps: The patient complains of fairly severe muscle cramps in both legs. We'll check a metabolic panel and magnesium. He is advised to drink more water. He is only drinking diet soda this time. He was advised to decrease this significantly.  Current medicines are reviewed with the patient today.  The patient does not have concerns regarding medicines.  Labs/ tests ordered today include:   Orders Placed This Encounter  Procedures  . Basic Metabolic Panel (BMET)  . Magnesium  . EKG 12-Lead    Disposition:   FU 6 months  Signed, [name], MD  11/06/2015 10:32 AM    [facility] [place], [place], [place]  [address] Phone: [phone]; Fax: [phone]

## 2015-11-06 ENCOUNTER — Encounter: Payer: Self-pay | Admitting: Cardiovascular Disease

## 2015-11-06 ENCOUNTER — Ambulatory Visit (INDEPENDENT_AMBULATORY_CARE_PROVIDER_SITE_OTHER): Payer: Medicare Other | Admitting: Cardiovascular Disease

## 2015-11-06 VITALS — BP 122/68 | HR 74 | Ht 67.0 in | Wt 181.4 lb

## 2015-11-06 DIAGNOSIS — I428 Other cardiomyopathies: Secondary | ICD-10-CM

## 2015-11-06 DIAGNOSIS — I10 Essential (primary) hypertension: Secondary | ICD-10-CM

## 2015-11-06 DIAGNOSIS — R252 Cramp and spasm: Secondary | ICD-10-CM | POA: Diagnosis not present

## 2015-11-06 DIAGNOSIS — I5022 Chronic systolic (congestive) heart failure: Secondary | ICD-10-CM | POA: Diagnosis not present

## 2015-11-06 DIAGNOSIS — I429 Cardiomyopathy, unspecified: Secondary | ICD-10-CM | POA: Diagnosis not present

## 2015-11-06 LAB — BASIC METABOLIC PANEL
BUN: 24 mg/dL (ref 7–25)
CALCIUM: 9.4 mg/dL (ref 8.6–10.3)
CHLORIDE: 105 mmol/L (ref 98–110)
CO2: 24 mmol/L (ref 20–31)
Creat: 0.82 mg/dL (ref 0.70–1.18)
Glucose, Bld: 103 mg/dL — ABNORMAL HIGH (ref 65–99)
POTASSIUM: 4.2 mmol/L (ref 3.5–5.3)
Sodium: 139 mmol/L (ref 135–146)

## 2015-11-06 LAB — MAGNESIUM: MAGNESIUM: 2.3 mg/dL (ref 1.5–2.5)

## 2015-11-06 NOTE — Patient Instructions (Signed)
Medication Instructions:  Your physician recommends that you continue on your current medications as directed. Please refer to the Current Medication list given to you today.  Labwork: Your physician recommends that you have lab work today: BMP and Magnesium  Testing/Procedures: No new orders.   Follow-Up: Your physician wants you to follow-up in: 6 MONTHS with Dr Cooper.  You will receive a reminder letter in the mail two months in advance. If you don't receive a letter, please call our office to schedule the follow-up appointment.   Any Other Special Instructions Will Be Listed Below (If Applicable).     If you need a refill on your cardiac medications before your next appointment, please call your pharmacy.   

## 2015-11-08 DIAGNOSIS — G8929 Other chronic pain: Secondary | ICD-10-CM | POA: Diagnosis not present

## 2015-11-08 DIAGNOSIS — M1711 Unilateral primary osteoarthritis, right knee: Secondary | ICD-10-CM | POA: Diagnosis not present

## 2015-11-08 DIAGNOSIS — M2241 Chondromalacia patellae, right knee: Secondary | ICD-10-CM | POA: Diagnosis not present

## 2015-11-08 DIAGNOSIS — N401 Enlarged prostate with lower urinary tract symptoms: Secondary | ICD-10-CM | POA: Diagnosis not present

## 2015-11-08 DIAGNOSIS — M25561 Pain in right knee: Secondary | ICD-10-CM | POA: Diagnosis not present

## 2015-11-10 DIAGNOSIS — E291 Testicular hypofunction: Secondary | ICD-10-CM | POA: Diagnosis not present

## 2015-11-10 DIAGNOSIS — N400 Enlarged prostate without lower urinary tract symptoms: Secondary | ICD-10-CM | POA: Diagnosis not present

## 2015-11-16 DIAGNOSIS — M25561 Pain in right knee: Secondary | ICD-10-CM | POA: Diagnosis not present

## 2015-11-16 DIAGNOSIS — G8929 Other chronic pain: Secondary | ICD-10-CM | POA: Diagnosis not present

## 2015-11-29 ENCOUNTER — Ambulatory Visit: Payer: Medicare Other | Admitting: Pulmonary Disease

## 2015-12-04 DIAGNOSIS — M25561 Pain in right knee: Secondary | ICD-10-CM | POA: Diagnosis not present

## 2015-12-04 DIAGNOSIS — M1711 Unilateral primary osteoarthritis, right knee: Secondary | ICD-10-CM | POA: Diagnosis not present

## 2015-12-04 DIAGNOSIS — S82141D Displaced bicondylar fracture of right tibia, subsequent encounter for closed fracture with routine healing: Secondary | ICD-10-CM | POA: Diagnosis not present

## 2015-12-04 DIAGNOSIS — M2241 Chondromalacia patellae, right knee: Secondary | ICD-10-CM | POA: Diagnosis not present

## 2015-12-04 DIAGNOSIS — E291 Testicular hypofunction: Secondary | ICD-10-CM | POA: Diagnosis not present

## 2015-12-05 ENCOUNTER — Other Ambulatory Visit: Payer: Self-pay | Admitting: Cardiovascular Disease

## 2015-12-06 ENCOUNTER — Ambulatory Visit (INDEPENDENT_AMBULATORY_CARE_PROVIDER_SITE_OTHER): Payer: Medicare Other | Admitting: Pulmonary Disease

## 2015-12-06 ENCOUNTER — Encounter: Payer: Self-pay | Admitting: Pulmonary Disease

## 2015-12-06 ENCOUNTER — Ambulatory Visit (INDEPENDENT_AMBULATORY_CARE_PROVIDER_SITE_OTHER)
Admission: RE | Admit: 2015-12-06 | Discharge: 2015-12-06 | Disposition: A | Discharge status: Home / Self Care | Payer: Medicare Other | Source: Ambulatory Visit | Attending: Pulmonary Disease | Admitting: Pulmonary Disease

## 2015-12-06 VITALS — BP 128/72 | HR 84 | Temp 97.7°F | Ht 67.0 in | Wt 181.0 lb

## 2015-12-06 DIAGNOSIS — I2510 Atherosclerotic heart disease of native coronary artery without angina pectoris: Secondary | ICD-10-CM | POA: Diagnosis not present

## 2015-12-06 DIAGNOSIS — K219 Gastro-esophageal reflux disease without esophagitis: Secondary | ICD-10-CM

## 2015-12-06 DIAGNOSIS — I509 Heart failure, unspecified: Secondary | ICD-10-CM | POA: Diagnosis not present

## 2015-12-06 DIAGNOSIS — E559 Vitamin D deficiency, unspecified: Secondary | ICD-10-CM

## 2015-12-06 DIAGNOSIS — M159 Polyosteoarthritis, unspecified: Secondary | ICD-10-CM

## 2015-12-06 DIAGNOSIS — Z125 Encounter for screening for malignant neoplasm of prostate: Secondary | ICD-10-CM

## 2015-12-06 DIAGNOSIS — D126 Benign neoplasm of colon, unspecified: Secondary | ICD-10-CM

## 2015-12-06 DIAGNOSIS — E291 Testicular hypofunction: Secondary | ICD-10-CM

## 2015-12-06 DIAGNOSIS — M545 Low back pain, unspecified: Secondary | ICD-10-CM

## 2015-12-06 DIAGNOSIS — N138 Other obstructive and reflux uropathy: Secondary | ICD-10-CM

## 2015-12-06 DIAGNOSIS — G4733 Obstructive sleep apnea (adult) (pediatric): Secondary | ICD-10-CM | POA: Diagnosis not present

## 2015-12-06 DIAGNOSIS — E039 Hypothyroidism, unspecified: Secondary | ICD-10-CM

## 2015-12-06 DIAGNOSIS — E785 Hyperlipidemia, unspecified: Secondary | ICD-10-CM | POA: Diagnosis not present

## 2015-12-06 DIAGNOSIS — I10 Essential (primary) hypertension: Secondary | ICD-10-CM

## 2015-12-06 DIAGNOSIS — N401 Enlarged prostate with lower urinary tract symptoms: Secondary | ICD-10-CM

## 2015-12-06 DIAGNOSIS — K432 Incisional hernia without obstruction or gangrene: Secondary | ICD-10-CM

## 2015-12-06 DIAGNOSIS — M150 Primary generalized (osteo)arthritis: Secondary | ICD-10-CM

## 2015-12-06 MED ORDER — PANTOPRAZOLE SODIUM 40 MG PO TBEC: DELAYED_RELEASE_TABLET | ORAL | Status: DC

## 2015-12-06 NOTE — Patient Instructions (Signed)
Today we updated your med list in our EPIC system...    Continue your current medications the same...    We refilled your meds per request...  Today we checked a follow up CXR... Please ret to our lab in the AM for your FASTING blood work...    We will contact you w/ the results when available...   Call for any questions or if we can be of service in any way...  Let's plan a follow up visit in 59yr sooner if needed for problems..Marland KitchenMarland Kitchen

## 2015-12-06 NOTE — Progress Notes (Signed)
Subjective:    Patient ID: DELVIS KAU, male    DOB: 1945/09/23, 70 y.o.   MRN: [id number]  HPI 70 y/o WM here for a follow up visit... he has multiple medical problems as noted below...    ~  December 20, 2009:  generally stable overall- notes some nocturnal CTS symptoms but doesn't want to use wrist splints; exercising daily w/ treadmill, etc- notes tired & SOB after 45 min work out or if taking stairs... OSA stable on CPAP; BP controlled on meds & denies angina, palpit, etc; Lipids stable on Cres10, Niacin, FishOil; denies any GI concerns at present; saw DrTannenbaum recently w/ PSA reported at 1.3 but found "Low-T" & planning more tests; still taking Celebrex regularly for his DJD; requests refill MMW for Prn use...   ~  June 18, 2010:  he's had a good 70mo still c/o bilat CTS symtpms in the AM & he will try the wrist splints Qhs...  he was started on AXIRON topical testosterone Rx per DrTannenbaum & improved... he saw DrCooper 8/11 & stable from the CV standpoint- no changes made... [facility] ?[facility] ?LN in neck- nothing pathological found & told to watch it (exam today= neg no node palp)... he will ret for Fasting blood work...  ~  January 07, 2011:  775moOV & he has been doing well overall but has several requests today> wants generic replacement for Diovan (we discussed Losartan); and wants referral to DrDeveshwar for Rheum eval, plus cheaper Celebrex (we discussed Etodolac)...    His BP is well controlled on Coreg & Diovan> measures 132/82 today & he remains asymptomatic;  FLP last done 2/12 & looked great w/ LDL 51 on Crestor10, but HDL low at 26 despite Niacin & fish oil rx> rec continue same meds, low fat diet, & incr exercise)...    He continues to f/u w/ DrCooper for Cards every 30m730moeen last wk> CAD, s/p stent RCA 2005, LBBB; last cath was 2009 w/ patent RCA stent & mild non-obstructive dis elsewhere; doing well, exercising regularly, no CP/ palpit/  SOB/ edema/ etc; rec to continue ASA/ Plavix,     He saw DrTannenbaum 6/12 for Urology f/u> BPH, Hypogonad- on AXIRON w/ incr energy, better appetite, sl wt gain; rec to continue same Rx...    Finally he reports some type of ENT/ Oral surg 6wks ago> [facility] to remove a growth in his sinus= ?dermoid...  ~  July 10, 2011:  30mo18mo & DannJoseandress he is doing fine- no new complaints or concerns; we reviewed his Prob List, Meds, XRays & Labs...    OSA> stable on CPAP w/ supplies Q30mo 54mo [employer]oksissues w/ his machine or interface; rests well, wakes refreshed, no daytime hypersomnolence & no prob w/ alertness...    HBP & CAD, s/p stent in RCA 2005, LBBB, EF=45-50% w/ septal dysynergy> followed by DrCooper on ASA/ Plavix/ Coreg12.5Bid/ Cozaar100; BP= 118/64 & he denies CP, palpit, dizzy, SOB, edema...    CHOL> on Cres10, slo-niacin, FishOil, CoQ10, etc; FLP 2/13 showed TChol 124, TG 167, HDL 33, LDL 57; needs better low fat diet...    GI> HH, Hx stricture, antral nodule=adenoma> on Protonix40 followed by DrPatterson & JacobArdis Hughs below)...    GU> BPH, LTOS, Low-T> on Axiron; he relates a story about GU symptoms & extensive work up in their office "doing just fine" he says, incr  energy, etc; treated for seminal vesiculitis w/ Doxy x3wks...    Ortho> DJD, LBP> prev on Celebrex & switched to generic Etodolac; he notes that he's been hurting since he was 70 y/o w/ a strong fam hx arthritis; he requested referral to Rheum & he saw DrDeveshwar but she indicated that anti-inflamm meds wouldn't help & he should take pain med 3/d or as needed...    Derm> Hx skin cancers treated by DrDJones...  LABS 2/13:  FLP- elevTG & lowHDL on Cres10;  Chems- wnl;  CBC- wnl;  TSH=1.91;  PSA=2.08;  VitD=58;  UA- clear  ~  January 07, 2012:  33moROV & DWalleris feeling well overall just c/o some intermittent nausea after eating & we discussed further eval by DrJacobs (GI) when he is  ready> he had neg Sonar 2009 x for incr liver echodensity suggesting some steatosis...  He also notes that he is sched for foot surg 11/13 by DArnette Norris..  We reviewed his cardiac eval from DrCooper >>    We reviewed prob list, meds, xrays and labs> see below>>  LABS 7/13 by DrDeveshwar:  Chems- wnl x BS=109;  CBC- wnl  ~  November 29, 2014:  3 year ROV & DJerzyreturns after a long hiatus, expertly cared for in the interval by DrCooper for Cards, DrCram for NS, DrDeveshwar for Rheum, DrTannenbaum for Urology, and DrCornett for CCS... He tells me that he needs 2 knees, had lumbar lam by DrCram about 275mogo, and he wants to see ENT due to losing his voice- voice getting weaker assoc w/ dry cough, denies sput, c/o mild SOB "it's my heart"    OSA> stable on CPAP w/ supplies Q6m24moom AprFort Laramieo issues w/ his machine or interface; rests well, wakes refreshed, no daytime hypersomnolence & no prob w/ alertness...    HBP & CAD, s/p stent in RCA 2005, LBBB, decr EF w/ septal dysynergy, chr dyspnea> followed by DrCooper on ASA/ Plavix/ Coreg12.5Bid/ Cozaar100; BP= 142/72 & he denies CP, palpit, dizzy, SOB, edema; last saw DrCooper 07/2014 & his note is reviewed    CHOL> on Lip20, FishOil, CoQ10; FLP 9/15 showed TChol 125, TG 154, HDL 30, LDL 64; needs better low fat diet...    GI> HH, Hx stricture, antral nodule=adenoma> on Protonix40 followed by JacArdis Hughsee below); colonoscopy 1/16 was neg/ wnl...    GU> BPH, LTOS, Low-T> on Flomax0.4 & Androgel per DrTannenbaum; he relates a story about GU symptoms & extensive work up in their office "doing just fine" he says, incr energy, etc; treated for seminal vesiculitis w/ Doxy in the past; last seen 3/16 and note reviewed...     Ortho> DJD, LBP> prev on Celebrex & switched to Etodolac & now Percocet & Tramadol; he notes that he's been hurting since he was [age]o w/ a strong fam hx arthritis; he saw DrDeveshwar for Rheum & DrCram for NS- s/p Lumbar decompression for resection of  synovial cyst & large bone spur L5-S1    Derm> Hx skin cancers treated by DrDJones... EXAM showed Afeb, VSS, O2sat=96% on RA;  HEENT- neg, mallampati1;  Chest- clear w/o w/r/r;  Heart- RR gr1/6 SEM S4 w/o rubs;  Abd- soft, nontender, neg;  Ext- neg w/o c/c/e;  Neuro- intact... We reviewed prob list, meds, xrays and labs> see below for updates >> given TDAP & Prevnar-13, he reports that he got the Pneumovax23 at health dept in 2009...  2DEcho 07/22/11 showed mild LVH, & decr LVF w/ EF~30% w/  HK, Gr1 DD, mild MR, mild LAdil, ..   CXR 08/13/12 showed norm heart size, clear lungs, NAD.Marland KitchenMarland Kitchen   EKG 05/24/14 showed NSR, rate76, LBBB, no acute changes...  CATH [id number] showed patent coronaries and open stent in RCA, nonobstructive dis in LAD, mild to mod LVD (EF=50%) w/ norm LVEDP- medical management.  Spirometry 11/29/14 showed FVC=2.84 (67%), FEV1=2.05 (63%), %1sec=72, mid-flows reduced at 46% predicted; c/w mild airflow obstruction but can't r/o superimposed restriction w/o lung volumes, etc...   LABS 5/16 showed Chems- wnl & CBC- wnl IMP/PLAN>>  A lot to get caught up on after a 3 yr hiatus;  meds refilled, he wants ZPak for prn use, TDAP & Prevnar-13 today, refer to ENT for check due to losing his voice...  ~  December 06, 2015:  Yearly Erwin f/u visit>  Rashid reports an number of things that have occurred over the past yr:  1) root canal done yest, on ?Clinda300tid (didn't bring meds or list to todays visit) & reminded to take Probiotic daily + Activia yogurt;  2) has "stress fx" R knee, eval by DrOlin- wearing brace & has cane;  3) Back surg 5/16 by DrCram w/ "cyst" removed;  4) he says XRays and MRIs have shown that he is "eaten up w/ arthritis" and there is nothing else that they can do; he sees Rheum- DrDeveshwar & she will check him before he travels & give him a shot... 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    HBP & CAD, s/p stent in RCA 2005, LBBB, decr EF 35-40% w/ septal dysynergy, chr dyspnea> followed by DrCooper off Plavix, on ASA/ Coreg12.5Bid/ Cozaar100; BP= 128/72 & he denies CP, palpit, dizzy, SOB, edema; last saw DrCooper 6/17 & his note is reviewed- HBP, CAD, s/p PCI to RCA, LBBB, no recurrent ischemia (cath 1/16 w/ patent stent) & he has chr dyspnea; lim in exerc by his knee prob; he worries a lot about statin related side effects.    CHOL> on Lip20, FishOil, CoQ10; FLP 10/16 showed TChol 121, TG 160, HDL 32, LDL 58; needs better low fat diet...    GI> HH, Hx stricture, antral nodule=adenoma> on Protonix40 followed by DrJacobs (see below); EGD 1/16 showed 3cmHH & schatzki's ring, duodenal adenoma resected & a subepithelial antral lesion; colonoscopy 1/16 was neg/ wnl...    GU> BPH, LTOS, Low-T> on Flomax0.4 & Androgel per DrTannenbaum; he relates a story about GU symptoms & extensive work up in their office "doing just fine" he says, incr energy, etc; treated for seminal vesiculitis w/ Doxy in the past; last seen 6/17 and note reviewed- LTOS & Low-T, on Flomax & Androgel...    Ortho> DJD, LBP> prev on Celebrex & Percocet, switched to Relafen & Tramadol; he notes that he's been hurting since he was 70y/o w/ a strong fam hx arthritis; he saw DrDeveshwar for Rheum & DrCram for NS- s/p Lumbar decompression for resection of synovial cyst & large bone spur L5-S1    Derm> Hx skin cancers treated by DrDJones... EXAM showed Afeb, VSS, O2sat=96% on RA;  HEENT- neg, mallampati1;  Chest- clear w/o w/r/r;  Heart- RR gr1/6 SEM S4 w/o rubs;  Abd- soft, nontender, neg;  Ext- neg w/o c/c/e;  Neuro- intact...  CXR  12/06/15> heart at upper lim of norm, lungs essentially clear w/ some scarring on left, NAD...  FASTING blood work> he never returned for FASTING labs as requested... IMP/PLAN>>  [place] appears stable w/ his medical care distributed among his specialists- DrCooper, DrJacobs, DrTannenbaum, DrDeveshwar, DrCram; continue present meds...            PROBLEM LIST:  ENT >> some type of ENT/ Oral surg 7/12> referred by his dentist to [facility] w/ surg thru gums to remove a growth in his sinus= ?dermoid...  OBSTRUCTIVE SLEEP APNEA - Hx OSA w/ sleep study 11/02 RDI=18 and desat to 85%; seen by DrClance and on CPAP rx;  he's been using it regularly, denies daytime hypersomnolence, etc;  He gets machine check & new mask Q52mothru [employer] & he reports download showed 12,000 hrs use! ~  CXR 3/10 clear w/ min left basilar scarring... ~  Pt gets his CPAP machine checked & masks replaced every 621mohru [place]. ~  CXR 10/12 showed normal heart size, clear lungs x left base atx, mild degen changes in TSpine...  Episode of HEMarshall/10 after episode of BRB hemoptysis> full eval was neg, no recurrence... he had bronchoscopy- neg,  ENT eval DrRosen- neg, noted some persistant cough treated w/ Mucinex DM, Zyrtek, Tussionex...  no recurrence. ~  NOTE> he is an exsmoker: started at ~11, smoked up til ~41, for a 30 yr smoking hx & he quit >30 yrs ago...  HBP & CAD-S/P stent in RCA on 2005 - followed regularly by DrCooper on ASA 8174m, off PLAVIX,  COREG 12.5mg69m,  LOSARTAN 100mg76m.  BP= 120/72 today & similar at home> denies HA, fatigue, visual changes, CP, palipit, dizziness, syncope, dyspnea, edema, etc...  ~  EKG w/ baseline LBBB;  ~  Adenosine MRI @ [facility] 9/07= no ischemia & EF=50-55;  ~  last cath 3/06 w/ non-obstructive 3 vessel dz (20% lesions in all 3) & mild global HK w/ EF=45-50%;   ~  last cardiolite 2/06 without ischemia or infarction, EF=44% w/ abn septal motion; ~  2DEcho 7/09 showed septal  dyssynergy related to the IVCD, EF= 45-50 %, mild focal basal septal hypertrophy, doppler parameters were consistent with abnormal left ventricular relaxation, mild MR... ~  2/13: he had f/u DrCooper> still c/o fatigue & dyspnea; no angina, BP controlled, BNP=25, EKG showed NSR, rate72, LBBB;  2DEcho & cardiac MRI are planned... ~  2DEcho 3/13 showed decr EF=30% w/ HK, mild LVH, Gr1DD, mild MR ~  3/13:  Cardiac MRI showed mild LVenlargement, mild LAE, no scar or infarct, no ischemia, no regional wall motion abn, but there was abn septal motion & EF=48%  HYPERLIPIDEMIA - he follows a low fat diet and takes LIPITOR 20mg/57mff NIACIN, Fish Oil, Flax Seed Oil, & CoQ10... ~  FLP 9/[place]ol 110, TG 82, HDL 28, LDL 66 ~  FLP 8/09 showed TChol 177, TG 130, HDL 36, LDL 115 ~  FLP 1/10 showed TChol 114, TG 109, HDL 31, LDL 62 ~  FLP 2/11 showed TChol 103, TG 85, HDL 36, LDL 51 ~  FLP 2/12 showed TChol 101, TG 121, HDL 26, LDL 51 ~  FLP 2/13 on Cres10+Niacin500 showed TChol 124, TG 167, HDL 33, LDL 57  HIATAL HERNIA, Hx of ESOPHAGEAL STRICTURE, OTHER  SPECIFIED DISORDER OF STOMACH AND DUODENUM (ICD-537.89) >> he gets the [facility], along w/ the similar product from [facility]- "I read too much"- he stopped PPI/ Dexilant Rx in favor of "Mesick- it works as well per the [employer]"...  ~  he had an EGD 6/08 showing a 3 cm HH & distal esoph stricture dilated;  Rx Pepcid 70m/d... ~  EGD 09/02/08 by DrPerry showed a HH, schatzki ring, 1cm submucosal nodule in antrum, 2cm sessile polyp in duod. 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 ~  f/u DrPatterson 6/10- continue KAPIDEX & reflux regimen... ~  1/12:  he is overdue for f/u w/ GI & we will request appt. ~  11/12: he saw DrJacobs for f/u eval & had EGD12/12 showing  Schatzki's ring, sessile duod polyp (Bx=tubular adenoma), unchanged submucosal nodule... ~  8/13:  He lists PROTONIX 4074m on his current med list... ~  1/16:  EGD by DrJacobs showed a schatzki's ring above a 3cm HH, antral subepith lesion unchanged from 3 yrs ago, 3mm65mlypoid mucosa in duodenum (path= duod adenoma)- they plan repeat in 3 yrs...  Hx of Hyperplastic COLONIC POLYP in 1999 - all followed by DrPatterson;  ~  his last colonoscopy was 8/04 and normal without recurrent polyps... ~  f/u colonoscopy 8/09 by DrPatterson showed 2mm 66myp, bx= hyperplastic, f/u planned 78yrs.72yr1/16:  Colonoscopy by DrJacobs was wnl & f/u rec in 10 yrs.  Hx Elevated LFT's - routine CPX 12/08 w/ abn LFTs- this was felt to be secondary to his Statin therapy... he was told to stop the Statin and f/u labs in one month... he requested a liver evaluation from DrPatterson and he was seen 12/09 at which time DrPatterson rechecked his LFT's and they were all back to normal... he also checked the following: ~  AbdSonar w/ echodense liver, no lesions or GB problems... ~  Viral Hepatitis serologies were all negative, including HepC << 8/12: CDC came out w/ new rec that all Boomers should be checked for HepC. ~  A1AT level was normal ~  Ceruloplasmin level was normal ~  ANA was neg, and Antimitochondrial AB/ Anti Smooth Muscle Ab were neg as well... ~  Serum Fe was normal & B12/Folate levels were normal as well... ~  2/12 NOTE:  LFTs have remained WNL & he is on Cres10.  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 ~  1/12: he reports Rx by DrTannenbaum w/ ANDROGEL 1.62% pump- use under each arm daily; Testos level 12/11= 575 on Rx.  OSTEOARTHRITIS, Hx of BACK PAIN - as  noted he feels he must take the Celebrex daily as his pain (esp R knee) is much worse when he doesn't take it;  he sees DrOlin and had a RKnee arthroscopy 9/07> he uses Glucosamine, MVI, Vit D & Folic... ~  7/10:  he requests Rx for Celebrex 100mg #62mtake 1 cap daily as needed (advised just Prn use). ~  8/12:  Pt requests generic substitute for Celebrex (try RELAFEN 500mg) &2merral to DrDeveshwar for Rheumatology eval... ~  12/12:  Seen  by Ladene Artist for his OA, left shoulder pain w/ rotator cuff tear per DrCollins, tried shot; he has OA in hands and feet; rec Tramadol vs Relafen Rx...  Skin Cancers - he is followed by Dr Jarome Matin w/ [facility] Cream skin ca therapy- skin peel that attacks cancer cells.  Cedro given age 26 (2011);  he get the yearly seasonal Flu vaccine every fall;  he had a shingles vaccine as well;  had tetanus shot 2005 w/ trip to [place];  up-to-date on colonoscopy done 2009; prostate checks by DrTannenbaum yearly...   Past Surgical History  Procedure Laterality Date  . S/p right knee arthroscopy  2005  . Rca stenting      '05 RCA  . Hernia surgery x 3      Bilateral Inguinal, Umbicial  . Oral surg to removed growth from ?sinus  10/2010    ?Dermoid removed by DrRiggs  . Colonoscopy  12/2007    HYPERPLASTIC POLYP  . Upper gastrointestinal endoscopy    . Right toe surgery  Right     Cyst   . Irrigation and debridement abscess Left 08/14/2012    Procedure: IRRIGATION AND DEBRIDEMENT LEFT INGUINAL BOIL ;  Surgeon: Ailene Rud, MD;  Location: [facility] ORS;  Service: Urology;  Laterality: Left;  . Cardiac catheterization      '05, last 2009, showing patent RCA stent  . Left heart catheterization with coronary angiogram N/A 05/26/2014    Procedure: LEFT HEART CATHETERIZATION WITH CORONARY ANGIOGRAM;  Surgeon: Blane Ohara, MD;  Location: [facility] CATH LAB;  Service: Cardiovascular;  Laterality: N/A;  . Colonoscopy with propofol N/A 06/02/2014     Procedure: COLONOSCOPY WITH PROPOFOL;  Surgeon: [name], MD;  Location: [facility] ENDOSCOPY;  Service: Endoscopy;  Laterality: N/A;  . Esophagogastroduodenoscopy (egd) with propofol N/A 06/02/2014    Procedure: ESOPHAGOGASTRODUODENOSCOPY (EGD) WITH PROPOFOL;  Surgeon: [name], MD;  Location: [facility] ENDOSCOPY;  Service: Endoscopy;  Laterality: N/A;  . Coronary angioplasty  08/2003  . Lumbar laminectomy/decompression microdiscectomy Left 09/26/2014    Procedure: Lumbar Laminectomy for resection of synovial cyst  Lumbar five- sacral one left;  Surgeon: Kary Kos, MD;  Location: [place] NEURO ORS;  Service: Neurosurgery;  Laterality: Left;    Outpatient Encounter Prescriptions as of 12/06/2015  Medication Sig  . acetaminophen (TYLENOL) 500 MG tablet Take 500 mg by mouth every 6 (six) hours as needed for moderate pain.  . ANDROGEL PUMP 20.25 MG/ACT (1.62%) GEL Apply 2 Act topically daily.   Marland Kitchen aspirin EC 81 MG tablet Take 81 mg by mouth daily.  Marland Kitchen atorvastatin (LIPITOR) 20 MG tablet Take 1 tablet (20 mg total) by mouth daily at 6 PM.  . b complex vitamins tablet Take 1 tablet by mouth daily.   . carvedilol (COREG) 12.5 MG tablet Take 1 tablet (12.5 mg total) by mouth 2 (two) times daily with a meal.  . Coenzyme Q10 (CO Q 10) 100 MG CAPS Take 1 capsule by mouth daily.   . fish oil-omega-3 fatty acids 1000 MG capsule Take 1 g by mouth daily.   . folic acid (FOLVITE) 865 MCG tablet Take 400 mcg by mouth daily.   . Glucosamine-Chondroitin 1500-1200 MG/30ML LIQD Take 1 tablet by mouth daily.   . hydroxypropyl methylcellulose / hypromellose (ISOPTO TEARS / GONIOVISC) 2.5 % ophthalmic solution Place 1 drop into both eyes daily as needed for dry eyes.  Marland Kitchen losartan (COZAAR) 100 MG tablet Take 1 tablet (100 mg total) by  mouth daily.  Marland Kitchen MAGNESIUM PO Take 1 tablet by mouth every Monday, Wednesday, Friday, and Saturday at 6 PM.  . Multiple Vitamin (MULTIVITAMIN) capsule Take 1 capsule by mouth daily.    .  multivitamin-lutein (OCUVITE-LUTEIN) CAPS capsule Take 1 capsule by mouth daily.  . nabumetone (RELAFEN) 500 MG tablet Take 500 mg by mouth daily.  . NON FORMULARY CPAP machine  . pantoprazole (PROTONIX) 40 MG tablet TAKE 1 TABLET BY MOUTH EVERY DAY BEFORE BREAKFAST  . POTASSIUM PO Take 1 tablet by mouth every Monday, Wednesday, Friday, and Saturday at 6 PM.  . Probiotic Product (PROBIOTIC DAILY PO) Take 1 capsule by mouth daily.   . tamsulosin (FLOMAX) 0.4 MG CAPS capsule Take 1 capsule (0.4 mg total) by mouth daily.  . traMADol (ULTRAM) 50 MG tablet Take 50 mg by mouth 3 (three) times daily as needed for pain.   . TURMERIC PO Take 1 tablet by mouth daily.   . [DISCONTINUED] pantoprazole (PROTONIX) 40 MG tablet TAKE 1 TABLET BY MOUTH EVERY DAY BEFORE BREAKFAST  . levofloxacin (LEVAQUIN) 500 MG tablet Take 1 tablet (500 mg total) by mouth daily. (Patient not taking: Reported on 12/06/2015)  . oxyCODONE-acetaminophen (PERCOCET/ROXICET) 5-325 MG per tablet Take 1-2 tablets by mouth every 4 (four) hours as needed for moderate pain. (Patient not taking: Reported on 12/06/2015)  . [DISCONTINUED] pantoprazole (PROTONIX) 40 MG tablet TAKE 1 TAB BY MOUTH DAILY BEFORE BREAKFAST   No facility-administered encounter medications on file as of 12/06/2015.    Allergies  Allergen Reactions  . Sulfamethoxazole-Trimethoprim Rash       . Sulfonamide Derivatives Rash         Current Medications, Allergies, Past Medical History, Past Surgical History, Family History, and Social History were reviewed in [employer] record.     Review of Systems       See HPI - all other systems neg except as noted...       The patient complains of dyspnea on exertion.  The patient denies anorexia, fever, weight loss, weight gain, vision loss, decreased hearing, hoarseness, chest pain, syncope, peripheral edema, prolonged cough, headaches, hemoptysis, abdominal pain, melena, hematochezia, severe  indigestion/heartburn, hematuria, incontinence, muscle weakness, suspicious skin lesions, transient blindness, difficulty walking, depression, unusual weight change, abnormal bleeding, enlarged lymph nodes, and angioedema.     Objective:   Physical Exam      WD, WN, 70 y/o WM in NAD... GENERAL:  Alert & oriented; pleasant & cooperative. HEENT:  [place]/AT, EOM-wnl, PERRLA, EACs-clear, TMs-wnl, NOSE-clear, THROAT-clear & wnl. NECK:  Supple w/ fairROM; no JVD; normal carotid impulses w/o bruits; no thyromegaly or nodules palpated; no lymphadenopathy. CHEST:  Clear to P & A; without wheezes/ rales/ or rhonchi. HEART:  Regular Rhythm; without murmurs/ rubs/ or gallops. ABDOMEN:  Soft & nontender; normal bowel sounds; no organomegaly or masses detected. EXT: without deformities, mild arthritic changes; no varicose veins/ venous insuffic/ or edema. NEURO:  CN's intact; motor testing normal; sensory testing normal; gait normal & balance OK. DERM:  No lesions noted; no rash etc...  RADIOLOGY DATA:  Reviewed in the EPIC EMR & discussed w/ the patient...  LABORATORY DATA:  Reviewed in the EPIC EMR & discussed w/ the patient...   Assessment & Plan:    OSA>  He uses his CPAP compliantly w/o problems or daytime hypersomnolence...  HBP>  Controlled on Coreg & Losartan; continue same...  CAD>  followed by DrCooper & most recent [place] w/ reduced LVF, Cath  1/16- his notes are reviewed...  HYPERLIPID>  Stable on Lip20, Fish Oil, etc; needs better low fat diet, he has DrCooper check his labs...  GI> HH, Hx stricture, s/p removal of duod adenoma>  Followed by DrJacobs...  GI> Hx colon polyps>  F/u EGD & colon done 1/16 by DrJacobs...  GU> BPH, Low-T, ED>  Followed & managed by DrTannenbaum on Androgel & Flomax  DJD- knees and LBP>  He has seen DrDeveshwar for Rheum  & DrCram for NS...    Patient's Medications  New Prescriptions   No medications on file  Previous Medications    ACETAMINOPHEN (TYLENOL) 500 MG TABLET    Take 500 mg by mouth every 6 (six) hours as needed for moderate pain.   ANDROGEL PUMP 20.25 MG/ACT (1.62%) GEL    Apply 2 Act topically daily.    ASPIRIN EC 81 MG TABLET    Take 81 mg by mouth daily.   ATORVASTATIN (LIPITOR) 20 MG TABLET    Take 1 tablet (20 mg total) by mouth daily at 6 PM.   B COMPLEX VITAMINS TABLET    Take 1 tablet by mouth daily.    CARVEDILOL (COREG) 12.5 MG TABLET    Take 1 tablet (12.5 mg total) by mouth 2 (two) times daily with a meal.   COENZYME Q10 (CO Q 10) 100 MG CAPS    Take 1 capsule by mouth daily.    FISH OIL-OMEGA-3 FATTY ACIDS 1000 MG CAPSULE    Take 1 g by mouth daily.    FOLIC ACID (FOLVITE) 683 MCG TABLET    Take 400 mcg by mouth daily.    GLUCOSAMINE-CHONDROITIN 1500-1200 MG/30ML LIQD    Take 1 tablet by mouth daily.    HYDROXYPROPYL METHYLCELLULOSE / HYPROMELLOSE (ISOPTO TEARS / GONIOVISC) 2.5 % OPHTHALMIC SOLUTION    Place 1 drop into both eyes daily as needed for dry eyes.   LEVOFLOXACIN (LEVAQUIN) 500 MG TABLET    Take 1 tablet (500 mg total) by mouth daily.   LOSARTAN (COZAAR) 100 MG TABLET    Take 1 tablet (100 mg total) by mouth daily.   MAGNESIUM PO    Take 1 tablet by mouth every Monday, Wednesday, Friday, and Saturday at 6 PM.   MULTIPLE VITAMIN (MULTIVITAMIN) CAPSULE    Take 1 capsule by mouth daily.     MULTIVITAMIN-LUTEIN (OCUVITE-LUTEIN) CAPS CAPSULE    Take 1 capsule by mouth daily.   NABUMETONE (RELAFEN) 500 MG TABLET    Take 500 mg by mouth daily.   NON FORMULARY    CPAP machine   OXYCODONE-ACETAMINOPHEN (PERCOCET/ROXICET) 5-325 MG PER TABLET    Take 1-2 tablets by mouth every 4 (four) hours as needed for moderate pain.   POTASSIUM PO    Take 1 tablet by mouth every Monday, Wednesday, Friday, and Saturday at 6 PM.   PROBIOTIC PRODUCT (PROBIOTIC DAILY PO)    Take 1 capsule by mouth daily.    TAMSULOSIN (FLOMAX) 0.4 MG CAPS CAPSULE    Take 1 capsule (0.4 mg total) by mouth daily.   TRAMADOL (ULTRAM)  50 MG TABLET    Take 50 mg by mouth 3 (three) times daily as needed for pain.    TURMERIC PO    Take 1 tablet by mouth daily.   Modified Medications   Modified Medication Previous Medication   PANTOPRAZOLE (PROTONIX) 40 MG TABLET pantoprazole (PROTONIX) 40 MG tablet      TAKE 1 TABLET BY MOUTH EVERY DAY BEFORE BREAKFAST  TAKE 1 TABLET BY MOUTH EVERY DAY BEFORE BREAKFAST  Discontinued Medications   No medications on file

## 2015-12-07 ENCOUNTER — Other Ambulatory Visit (INDEPENDENT_AMBULATORY_CARE_PROVIDER_SITE_OTHER): Payer: Medicare Other

## 2015-12-07 DIAGNOSIS — E039 Hypothyroidism, unspecified: Secondary | ICD-10-CM

## 2015-12-07 DIAGNOSIS — D126 Benign neoplasm of colon, unspecified: Secondary | ICD-10-CM

## 2015-12-07 DIAGNOSIS — E785 Hyperlipidemia, unspecified: Secondary | ICD-10-CM | POA: Diagnosis not present

## 2015-12-07 DIAGNOSIS — I10 Essential (primary) hypertension: Secondary | ICD-10-CM | POA: Diagnosis not present

## 2015-12-07 DIAGNOSIS — E559 Vitamin D deficiency, unspecified: Secondary | ICD-10-CM

## 2015-12-07 DIAGNOSIS — Z125 Encounter for screening for malignant neoplasm of prostate: Secondary | ICD-10-CM

## 2015-12-07 LAB — COMPLETE METABOLIC PANEL WITH GFR
ALBUMIN: 4 g/dL (ref 3.6–5.1)
ALK PHOS: 79 U/L (ref 40–115)
ALT: 17 U/L (ref 9–46)
AST: 16 U/L (ref 10–35)
BILIRUBIN TOTAL: 0.9 mg/dL (ref 0.2–1.2)
BUN: 13 mg/dL (ref 7–25)
CALCIUM: 9.2 mg/dL (ref 8.6–10.3)
CO2: 23 mmol/L (ref 20–31)
Chloride: 108 mmol/L (ref 98–110)
Creat: 0.94 mg/dL (ref 0.70–1.18)
GFR, EST NON AFRICAN AMERICAN: 81 mL/min (ref >=60)
GFR, Est African American: >89 mL/min (ref >=60)
GLUCOSE: 108 mg/dL — AB (ref 65–99)
Potassium: 4.2 mmol/L (ref 3.5–5.3)
SODIUM: 140 mmol/L (ref 135–146)
Total Protein: 6.7 g/dL (ref 6.1–8.1)

## 2015-12-07 LAB — LIPID PANEL
CHOLESTEROL: 134 mg/dL (ref 0–200)
HDL: 31.5 mg/dL — AB (ref >39.00)
LDL Cholesterol: 72 mg/dL (ref 0–99)
NONHDL: 102.88
Total CHOL/HDL Ratio: 4
Triglycerides: 153 mg/dL — ABNORMAL HIGH (ref 0.0–149.0)
VLDL: 30.6 mg/dL (ref 0.0–40.0)

## 2015-12-07 LAB — CBC WITH DIFFERENTIAL/PLATELET
BASOS ABS: 0 10*3/uL (ref 0.0–0.1)
Basophils Relative: 0.6 % (ref 0.0–3.0)
Eosinophils Absolute: 0.3 10*3/uL (ref 0.0–0.7)
Eosinophils Relative: 5 % (ref 0.0–5.0)
HEMATOCRIT: 40.8 % (ref 39.0–52.0)
Hemoglobin: 13.7 g/dL (ref 13.0–17.0)
LYMPHS PCT: 20.9 % (ref 12.0–46.0)
Lymphs Abs: 1.2 10*3/uL (ref 0.7–4.0)
MCHC: 33.5 g/dL (ref 30.0–36.0)
MCV: 87.8 fl (ref 78.0–100.0)
MONOS PCT: 11.1 % (ref 3.0–12.0)
Monocytes Absolute: 0.6 10*3/uL (ref 0.1–1.0)
NEUTROS ABS: 3.6 10*3/uL (ref 1.4–7.7)
Neutrophils Relative %: 62.4 % (ref 43.0–77.0)
Platelets: 246 10*3/uL (ref 150.0–400.0)
RBC: 4.65 Mil/uL (ref 4.22–5.81)
RDW: 14.4 % (ref 11.5–15.5)
WBC: 5.7 10*3/uL (ref 4.0–10.5)

## 2015-12-07 LAB — TSH: TSH: 2.8 u[IU]/mL (ref 0.35–4.50)

## 2015-12-07 LAB — VITAMIN D 25 HYDROXY (VIT D DEFICIENCY, FRACTURES): VITD: 46.94 ng/mL (ref 30.00–100.00)

## 2015-12-07 LAB — PSA, MEDICARE: PSA: 2.47 ng/mL (ref 0.10–4.00)

## 2015-12-11 ENCOUNTER — Telehealth: Payer: Self-pay | Admitting: Pulmonary Disease

## 2015-12-11 NOTE — Telephone Encounter (Signed)
Last PNA 13 shot given 11/2014.  Pt calling stating that mychart advised him that he is behind in getting the PNA vaccine.  SN is this something that the pt will need to come back for or can he wait for his next visit with you?  Please advise thanks  Allergies  Allergen Reactions  . Sulfamethoxazole-Trimethoprim Rash       . Sulfonamide Derivatives Rash

## 2015-12-12 NOTE — Telephone Encounter (Signed)
Per SN---  According to our records the pt told us that he received this PNA 23 back in 2009 from the health department.  I have called and spoke with pt and he is aware and stated that this was the case, but he thought this was meaning he needed a booster.  I advised the pt that he does not need a booster for the PNA vaccine.  Nothing further is needed

## 2016-01-04 ENCOUNTER — Encounter: Payer: Self-pay | Admitting: Pulmonary Disease

## 2016-01-04 DIAGNOSIS — L821 Other seborrheic keratosis: Secondary | ICD-10-CM | POA: Diagnosis not present

## 2016-01-04 DIAGNOSIS — D485 Neoplasm of uncertain behavior of skin: Secondary | ICD-10-CM | POA: Diagnosis not present

## 2016-01-04 DIAGNOSIS — L812 Freckles: Secondary | ICD-10-CM | POA: Diagnosis not present

## 2016-01-24 DIAGNOSIS — M2241 Chondromalacia patellae, right knee: Secondary | ICD-10-CM | POA: Diagnosis not present

## 2016-01-24 DIAGNOSIS — M25561 Pain in right knee: Secondary | ICD-10-CM | POA: Diagnosis not present

## 2016-01-24 DIAGNOSIS — M25661 Stiffness of right knee, not elsewhere classified: Secondary | ICD-10-CM | POA: Diagnosis not present

## 2016-01-30 DIAGNOSIS — Z6828 Body mass index (BMI) 28.0-28.9, adult: Secondary | ICD-10-CM | POA: Diagnosis not present

## 2016-01-30 DIAGNOSIS — M5137 Other intervertebral disc degeneration, lumbosacral region: Secondary | ICD-10-CM | POA: Diagnosis not present

## 2016-02-01 DIAGNOSIS — M1711 Unilateral primary osteoarthritis, right knee: Secondary | ICD-10-CM | POA: Diagnosis not present

## 2016-02-23 ENCOUNTER — Encounter (HOSPITAL_COMMUNITY): Payer: Self-pay

## 2016-02-26 ENCOUNTER — Encounter (HOSPITAL_COMMUNITY): Payer: Self-pay

## 2016-02-28 ENCOUNTER — Telehealth: Payer: Self-pay | Admitting: Pulmonary Disease

## 2016-02-28 NOTE — Telephone Encounter (Signed)
Leigh do we have a surgical clearance form? Thanks.

## 2016-02-28 NOTE — H&P (Signed)
TOTAL KNEE ADMISSION H&P  Patient is being admitted for right total knee arthroplasty.  Subjective:  Chief Complaint:    Right knee primary OA / pain  HPI: [name], 70 y.o. male, has a history of pain and functional disability in the right knee due to arthritis and has failed non-surgical conservative treatments for greater than 12 weeks to include NSAID's and/or analgesics, corticosteriod injections, viscosupplementation injections, use of assistive devices and activity modification.  Onset of symptoms was gradual, starting 13+ years ago with gradually worsening course since that time. The patient noted prior procedures on the knee to include  arthroscopy on the right knee(s).  Patient currently rates pain in the right knee(s) at 10 out of 10 with activity. Patient has worsening of pain with activity and weight bearing, pain that interferes with activities of daily living, pain with passive range of motion, crepitus and joint swelling.  Patient has evidence of periarticular osteophytes and joint space narrowing by imaging studie There is no active infection.   Risks, benefits and expectations were discussed with the patient.  Risks including but not limited to the risk of anesthesia, blood clots, nerve damage, blood vessel damage, failure of the prosthesis, infection and up to and including death.  Patient understand the risks, benefits and expectations and wishes to proceed with surgery.   PCP: [name], MD  D/C Plans:      Home  Post-op Meds:       No Rx given  Tranexamic Acid:      To be given - IV   Decadron:      Is to be given  FYI:     ASA  Norco  CPAP  Lower fluids, CHF    Patient Active Problem List   Diagnosis Date Noted  . Dyspnea 11/29/2014  . Synovial cyst of lumbar facet joint 09/26/2014  . Exertional angina (HCC)   . Incisional hernia, without obstruction or gangrene 12/06/2013  . Nausea alone 08/27/2013  . Colon cancer screening 08/27/2013  . Shortness  of breath 07/03/2011  . 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 BPH (benign prostatic hypertrophy) with urinary obstruction 06/18/2010  . HEMOPTYSIS 06/24/2009  . OTHER SPECIFIED DISORDER OF STOMACH AND DUODENUM 12/16/2008  . BENIGN NEOPLASM OF LIVER AND BILIARY PASSAGES 11/15/2008  . GERD 09/02/2008  . COLONIC POLYPS, HYPERPLASTIC 05/24/2007  . Obstructive sleep apnea 05/24/2007  . ALLERGIC RHINITIS 05/24/2007  . ESOPHAGEAL STRICTURE 05/24/2007  . HIATAL HERNIA 05/24/2007  . Hyperlipidemia 02/06/2007  . Essential hypertension 02/06/2007  . Coronary atherosclerosis 02/06/2007  . Osteoarthritis 02/06/2007  . Backache 02/06/2007   Past Medical History:  Diagnosis Date  . Allergic rhinitis   . Anginal pain ([place]) 05/26/14   chest pain after chasing dog  . CAD (coronary artery disease)    hx of stent- 2005 RCA  . CHF (congestive heart failure) ([place])   . Chronic back pain    intermittent  . Constipation   . Dizziness   . Dysrhythmia    right bundle branch block   . Esophageal stricture   . GERD (gastroesophageal reflux disease)   . Hemoptysis   . Hiatal hernia   . History of colonic polyps    hyperplastic  . HTN (hypertension)   . Hyperlipidemia   . Hypertrophy of prostate with urinary obstruction and other lower urinary tract symptoms (LUTS)   . OA (osteoarthritis)   . OSA (obstructive sleep apnea)    cpap- 10   . 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 Shortness of breath    with exertion  . Testicular hypofunction     Past Surgical History:  Procedure Laterality Date  . CARDIAC CATHETERIZATION     '05, last 2009, showing patent RCA stent  . COLONOSCOPY  12/2007   HYPERPLASTIC POLYP  . COLONOSCOPY WITH PROPOFOL N/A 06/02/2014   Procedure: COLONOSCOPY WITH PROPOFOL;  Surgeon: [name], MD;  Location: [facility];  Service: Endoscopy;  Laterality: N/A;  . CORONARY ANGIOPLASTY   08/2003  . ESOPHAGOGASTRODUODENOSCOPY (EGD) WITH PROPOFOL N/A 06/02/2014   Procedure: ESOPHAGOGASTRODUODENOSCOPY (EGD) WITH PROPOFOL;  Surgeon: [name], MD;  Location: [facility];  Service: Endoscopy;  Laterality: N/A;  . hernia surgery x 3     Bilateral Inguinal, Umbicial  . IRRIGATION AND DEBRIDEMENT ABSCESS Left 08/14/2012   Procedure: IRRIGATION AND DEBRIDEMENT LEFT INGUINAL BOIL ;  Surgeon: Ailene Rud, MD;  Location: [facility];  Service: Urology;  Laterality: Left;  . LEFT HEART CATHETERIZATION WITH CORONARY ANGIOGRAM N/A 05/26/2014   Procedure: LEFT HEART CATHETERIZATION WITH CORONARY ANGIOGRAM;  Surgeon: Blane Ohara, MD;  Location: [facility] CATH LAB;  Service: Cardiovascular;  Laterality: N/A;  . LUMBAR LAMINECTOMY/DECOMPRESSION MICRODISCECTOMY Left 09/26/2014   Procedure: Lumbar Laminectomy for resection of synovial cyst  Lumbar five- sacral one left;  Surgeon: Kary Kos, MD;  Location: [facility];  Service: Neurosurgery;  Laterality: Left;  . Oral surg to removed growth from ?sinus  10/2010   ?Dermoid removed by DrRiggs  . RCA stenting     '05 RCA  . right toe surgery  Right    Cyst   . s/p right knee arthroscopy  2005  . UPPER GASTROINTESTINAL ENDOSCOPY      No prescriptions prior to admission.   Allergies  Allergen Reactions  . Sulfamethoxazole-Trimethoprim Rash       . Sulfonamide Derivatives Rash         Social History  Substance Use Topics  . Smoking status: Former Smoker    Quit date: 05/20/1990  . Smokeless tobacco: Never Used     Comment: previous 30 pack year history  . Alcohol use No     Comment: rare    Family History  Problem Relation Age of Onset  . Coronary artery disease Father   . Diabetes Father   . Sudden death Father     due to heart disease  . Heart disease Father   . Heart disease Mother   . Hypertension    . Stomach cancer Paternal Grandmother   . Colon cancer Neg Hx   . Esophageal cancer Neg Hx      Review of Systems   Constitutional: Negative.   HENT: Negative.   Eyes: Negative.   Respiratory: Positive for shortness of breath (with exertion).   Cardiovascular: Negative.   Gastrointestinal: Positive for heartburn.  Genitourinary: Negative.   Musculoskeletal: Positive for back pain and joint pain.  Skin: Negative.   Neurological: Negative.   Endo/Heme/Allergies: Negative.   Psychiatric/Behavioral: Negative.     Objective:  Physical Exam  Constitutional: He is oriented to person, place, and time. He appears well-developed.  HENT:  Head: Normocephalic.  Eyes: Pupils are equal, round, and reactive to light.  Neck: Neck supple. No JVD present. No tracheal deviation present. No thyromegaly present.  Cardiovascular: Normal rate, regular rhythm, normal heart sounds and intact distal pulses.   Respiratory: Effort normal and breath sounds  normal. No respiratory distress. He has no wheezes.  GI: Soft. There is no tenderness. There is no guarding.  Musculoskeletal:       Right knee: He exhibits decreased range of motion, swelling and bony tenderness. He exhibits no ecchymosis, no deformity, no laceration and no erythema. Tenderness found.  Lymphadenopathy:    He has no cervical adenopathy.  Neurological: He is alert and oriented to person, place, and time.  Skin: Skin is warm and dry.  Psychiatric: He has a normal mood and affect.     Labs:  Estimated body mass index is 28.35 kg/m as calculated from the following:   Height as of 12/06/15: 5\' 7"  (1.702 m).   Weight as of 12/06/15: 82.1 kg (181 lb).   Imaging Review Plain radiographs demonstrate severe degenerative joint disease of the right knee(s). The overall alignment is neutral. The bone quality appears to be good for age and reported activity level.  Assessment/Plan:  End stage arthritis, right knee   The patient history, physical examination, clinical judgment of the provider and imaging studies are consistent with end stage degenerative  joint disease of the right knee(s) and total knee arthroplasty is deemed medically necessary. The treatment options including medical management, injection therapy arthroscopy and arthroplasty were discussed at length. The risks and benefits of total knee arthroplasty were presented and reviewed. The risks due to aseptic loosening, infection, stiffness, patella tracking problems, thromboembolic complications and other imponderables were discussed. The patient acknowledged the explanation, agreed to proceed with the plan and consent was signed. Patient is being admitted for inpatient treatment for surgery, pain control, PT, OT, prophylactic antibiotics, VTE prophylaxis, progressive ambulation and ADL's and discharge planning. The patient is planning to be discharged home.      West Pugh Terrel Nesheiwat   PA-C  02/28/2016, 5:00 PM

## 2016-02-29 ENCOUNTER — Encounter (HOSPITAL_COMMUNITY)
Admission: RE | Admit: 2016-02-29 | Discharge: 2016-02-29 | Disposition: A | Discharge status: Home / Self Care | Payer: Medicare Other | Source: Ambulatory Visit | Attending: Orthopedic Surgery | Admitting: Orthopedic Surgery

## 2016-02-29 ENCOUNTER — Encounter (HOSPITAL_COMMUNITY): Payer: Self-pay

## 2016-02-29 DIAGNOSIS — M1711 Unilateral primary osteoarthritis, right knee: Secondary | ICD-10-CM | POA: Diagnosis not present

## 2016-02-29 DIAGNOSIS — Z01818 Encounter for other preprocedural examination: Secondary | ICD-10-CM | POA: Diagnosis not present

## 2016-02-29 DIAGNOSIS — Z01812 Encounter for preprocedural laboratory examination: Secondary | ICD-10-CM | POA: Insufficient documentation

## 2016-02-29 HISTORY — DX: Pneumonia, unspecified organism: J18.9

## 2016-02-29 LAB — BASIC METABOLIC PANEL
Anion gap: 6 (ref 5–15)
BUN: 17 mg/dL (ref 6–20)
CALCIUM: 9.5 mg/dL (ref 8.9–10.3)
CO2: 24 mmol/L (ref 22–32)
CREATININE: 0.89 mg/dL (ref 0.61–1.24)
Chloride: 110 mmol/L (ref 101–111)
GFR calc Af Amer: >60 mL/min (ref >60)
GLUCOSE: 121 mg/dL — AB (ref 65–99)
POTASSIUM: 4.1 mmol/L (ref 3.5–5.1)
Sodium: 140 mmol/L (ref 135–145)

## 2016-02-29 LAB — CBC
HEMATOCRIT: 42.9 % (ref 39.0–52.0)
Hemoglobin: 14 g/dL (ref 13.0–17.0)
MCH: 29.7 pg (ref 26.0–34.0)
MCHC: 32.6 g/dL (ref 30.0–36.0)
MCV: 91.1 fL (ref 78.0–100.0)
PLATELETS: 235 10*3/uL (ref 150–400)
RBC: 4.71 MIL/uL (ref 4.22–5.81)
RDW: 13.4 % (ref 11.5–15.5)
WBC: 4.7 10*3/uL (ref 4.0–10.5)

## 2016-02-29 LAB — SURGICAL PCR SCREEN
MRSA, PCR: NEGATIVE
STAPHYLOCOCCUS AUREUS: NEGATIVE

## 2016-02-29 NOTE — Patient Instructions (Signed)
[name]  02/29/2016   Your procedure is scheduled on: 03/12/16  Report to [facility]  Entrance take [place]  elevators to 3rd floor to  [place] at  1130 AM.  Call this number if you have problems the morning of surgery [phone]   Remember: ONLY 1 PERSON MAY GO WITH YOU TO SHORT STAY TO GET  READY MORNING OF [place].  Do not eat food :After Midnight.             You may have clear liquids until 8:30 am     Take these medicines the morning of surgery with A SIP OF WATER: Carvedilol, Pantoprazol                               You may not have any metal on your body including hair pins and              piercings  Do not wear jewelry, make-up, lotions, powders or perfumes, deodorant             Do not wear nail polish.  Do not shave  48 hours prior to surgery.              Men may shave face and neck.   Do not bring valuables to the hospital. [place].  Contacts, dentures or bridgework may not be worn into surgery.  Leave suitcase in the car. After surgery it may be brought to your room.     Patients discharged the day of surgery will not be allowed to drive home.  Name and phone number of your driver:  Special Instructions: N/A              Please read over the following fact sheets you were given: _____________________________________________________________________             [facility] - Preparing for Surgery Before surgery, you can play an important role.  Because skin is not sterile, your skin needs to be as free of germs as possible.  You can reduce the number of germs on your skin by washing with CHG (chlorahexidine gluconate) soap before surgery.  CHG is an antiseptic cleaner which kills germs and bonds with the skin to continue killing germs even after washing. Please DO NOT use if you have an allergy to CHG or antibacterial soaps.  If your skin becomes  reddened/irritated stop using the CHG and inform your nurse when you arrive at Short Stay. Do not shave (including legs and underarms) for at least 48 hours prior to the first CHG shower.  You may shave your face/neck. Please follow these instructions carefully:  1.  Shower with CHG Soap the night before surgery and the  morning of Surgery.  2.  If you choose to wash your hair, wash your hair first as usual with your  normal  shampoo.  3.  After you shampoo, rinse your hair and body thoroughly to remove the  shampoo.                           4.  Use CHG as you would any other liquid soap.  You can apply chg  directly  to the skin and wash                       Gently with a scrungie or clean washcloth.  5.  Apply the CHG Soap to your body ONLY FROM THE NECK DOWN.   Do not use on face/ open                           Wound or open sores. Avoid contact with eyes, ears mouth and genitals (private parts).                       Wash face,  Genitals (private parts) with your normal soap.             6.  Wash thoroughly, paying special attention to the area where your surgery  will be performed.  7.  Thoroughly rinse your body with warm water from the neck down.  8.  DO NOT shower/wash with your normal soap after using and rinsing off  the CHG Soap.                9.  Pat yourself dry with a clean towel.            10.  Wear clean pajamas.            11.  Place clean sheets on your bed the night of your first shower and do not  sleep with pets. Day of Surgery : Do not apply any lotions/deodorants the morning of surgery.  Please wear clean clothes to the hospital/surgery center.  FAILURE TO FOLLOW THESE INSTRUCTIONS MAY RESULT IN THE CANCELLATION OF YOUR SURGERY PATIENT SIGNATURE_________________________________  NURSE SIGNATURE__________________________________  ________________________________________________________________________    CLEAR LIQUID DIET   Foods Allowed                                                                      Foods Excluded  Coffee and tea, regular and decaf                             liquids that you cannot  Plain Jell-O in any flavor                                             see through such as: Fruit ices (not with fruit pulp)                                     milk, soups, orange juice  Iced Popsicles                                    All solid food Carbonated beverages, regular and diet  Cranberry, grape and apple juices Sports drinks like Gatorade Lightly seasoned clear broth or consume(fat free) Sugar, honey syrup  Sample Menu Breakfast                                Lunch                                     Supper Cranberry juice                    Beef broth                            Chicken broth Jell-O                                     Grape juice                           Apple juice Coffee or tea                        Jell-O                                      Popsicle                                                Coffee or tea                        Coffee or tea  _____________________________________________________________________

## 2016-02-29 NOTE — Telephone Encounter (Signed)
Will check with SN about this form.  Will get this completed and sent back in for the pt.  SN please advise if you still have this form.  thanks

## 2016-02-29 NOTE — Telephone Encounter (Signed)
Note has been faxed over to [facility].  Nothing further is needed.

## 2016-03-12 ENCOUNTER — Encounter (HOSPITAL_COMMUNITY): Payer: Self-pay | Admitting: *Deleted

## 2016-03-12 ENCOUNTER — Inpatient Hospital Stay (HOSPITAL_COMMUNITY)
Admission: RE | Admit: 2016-03-12 | Discharge: 2016-03-14 | DRG: 470 | Disposition: A | Discharge status: Home / Self Care | Payer: Medicare Other | Source: Ambulatory Visit | Attending: Orthopedic Surgery | Admitting: Orthopedic Surgery

## 2016-03-12 ENCOUNTER — Encounter (HOSPITAL_COMMUNITY)
Admission: RE | Disposition: A | Discharge status: Home / Self Care | Payer: Self-pay | Source: Ambulatory Visit | Attending: Orthopedic Surgery

## 2016-03-12 ENCOUNTER — Inpatient Hospital Stay (HOSPITAL_COMMUNITY): Payer: Medicare Other | Admitting: Anesthesiology

## 2016-03-12 DIAGNOSIS — Z8249 Family history of ischemic heart disease and other diseases of the circulatory system: Secondary | ICD-10-CM

## 2016-03-12 DIAGNOSIS — Z79899 Other long term (current) drug therapy: Secondary | ICD-10-CM | POA: Diagnosis not present

## 2016-03-12 DIAGNOSIS — G8929 Other chronic pain: Secondary | ICD-10-CM | POA: Diagnosis present

## 2016-03-12 DIAGNOSIS — M25561 Pain in right knee: Secondary | ICD-10-CM | POA: Diagnosis not present

## 2016-03-12 DIAGNOSIS — E663 Overweight: Secondary | ICD-10-CM | POA: Diagnosis present

## 2016-03-12 DIAGNOSIS — M659 Synovitis and tenosynovitis, unspecified: Secondary | ICD-10-CM | POA: Diagnosis present

## 2016-03-12 DIAGNOSIS — M549 Dorsalgia, unspecified: Secondary | ICD-10-CM | POA: Diagnosis present

## 2016-03-12 DIAGNOSIS — G8918 Other acute postprocedural pain: Secondary | ICD-10-CM | POA: Diagnosis not present

## 2016-03-12 DIAGNOSIS — M1711 Unilateral primary osteoarthritis, right knee: Secondary | ICD-10-CM | POA: Diagnosis present

## 2016-03-12 DIAGNOSIS — Z7982 Long term (current) use of aspirin: Secondary | ICD-10-CM

## 2016-03-12 DIAGNOSIS — K219 Gastro-esophageal reflux disease without esophagitis: Secondary | ICD-10-CM | POA: Diagnosis present

## 2016-03-12 DIAGNOSIS — I110 Hypertensive heart disease with heart failure: Secondary | ICD-10-CM | POA: Diagnosis present

## 2016-03-12 DIAGNOSIS — Z86010 Personal history of colonic polyps: Secondary | ICD-10-CM

## 2016-03-12 DIAGNOSIS — Z6829 Body mass index (BMI) 29.0-29.9, adult: Secondary | ICD-10-CM

## 2016-03-12 DIAGNOSIS — Z833 Family history of diabetes mellitus: Secondary | ICD-10-CM | POA: Diagnosis not present

## 2016-03-12 DIAGNOSIS — Z87891 Personal history of nicotine dependence: Secondary | ICD-10-CM

## 2016-03-12 DIAGNOSIS — E291 Testicular hypofunction: Secondary | ICD-10-CM | POA: Diagnosis present

## 2016-03-12 DIAGNOSIS — G4733 Obstructive sleep apnea (adult) (pediatric): Secondary | ICD-10-CM | POA: Diagnosis present

## 2016-03-12 DIAGNOSIS — E785 Hyperlipidemia, unspecified: Secondary | ICD-10-CM | POA: Diagnosis present

## 2016-03-12 DIAGNOSIS — Z882 Allergy status to sulfonamides status: Secondary | ICD-10-CM | POA: Diagnosis not present

## 2016-03-12 DIAGNOSIS — Z955 Presence of coronary angioplasty implant and graft: Secondary | ICD-10-CM

## 2016-03-12 DIAGNOSIS — Z96659 Presence of unspecified artificial knee joint: Secondary | ICD-10-CM

## 2016-03-12 DIAGNOSIS — I509 Heart failure, unspecified: Secondary | ICD-10-CM | POA: Diagnosis present

## 2016-03-12 DIAGNOSIS — I2510 Atherosclerotic heart disease of native coronary artery without angina pectoris: Secondary | ICD-10-CM | POA: Diagnosis present

## 2016-03-12 HISTORY — PX: TOTAL KNEE ARTHROPLASTY: SHX125

## 2016-03-12 LAB — TYPE AND SCREEN
ABO/RH(D): O POS
Antibody Screen: NEGATIVE

## 2016-03-12 LAB — ABO/RH: ABO/RH(D): O POS

## 2016-03-12 SURGERY — ARTHROPLASTY, KNEE, TOTAL
Anesthesia: Spinal | Site: Knee | Laterality: Right

## 2016-03-12 MED ORDER — DEXAMETHASONE SODIUM PHOSPHATE 10 MG/ML IJ SOLN
INTRAMUSCULAR | Status: AC
  Filled 2016-03-12: qty 1

## 2016-03-12 MED ORDER — HYDROMORPHONE HCL 1 MG/ML IJ SOLN
0.5000 mg | INTRAMUSCULAR | Status: DC | PRN
  Administered 2016-03-12: 0.5 mg via INTRAVENOUS
  Administered 2016-03-12: 1 mg via INTRAVENOUS
  Filled 2016-03-12 (×2): qty 1

## 2016-03-12 MED ORDER — CHLORHEXIDINE GLUCONATE 4 % EX LIQD: 60.0000 mL | Freq: Once | CUTANEOUS | Status: DC

## 2016-03-12 MED ORDER — BUPIVACAINE HCL (PF) 0.25 % IJ SOLN
INTRAMUSCULAR | Status: AC
  Filled 2016-03-12: qty 30

## 2016-03-12 MED ORDER — MIDAZOLAM HCL 5 MG/5ML IJ SOLN
INTRAMUSCULAR | Status: DC | PRN
  Administered 2016-03-12 (×4): 0.5 mg via INTRAVENOUS

## 2016-03-12 MED ORDER — STERILE WATER FOR IRRIGATION IR SOLN
Status: DC | PRN
  Administered 2016-03-12: 3000 mL

## 2016-03-12 MED ORDER — CEFAZOLIN SODIUM-DEXTROSE 2-4 GM/100ML-% IV SOLN
INTRAVENOUS | Status: AC
  Filled 2016-03-12: qty 100

## 2016-03-12 MED ORDER — CEFAZOLIN SODIUM-DEXTROSE 2-4 GM/100ML-% IV SOLN
2.0000 g | INTRAVENOUS | Status: AC
  Administered 2016-03-12: 2 g via INTRAVENOUS
  Filled 2016-03-12: qty 100

## 2016-03-12 MED ORDER — SODIUM CHLORIDE 0.9 % IJ SOLN
INTRAMUSCULAR | Status: AC
  Filled 2016-03-12: qty 50

## 2016-03-12 MED ORDER — TRANEXAMIC ACID 1000 MG/10ML IV SOLN
1000.0000 mg | INTRAVENOUS | Status: AC
  Administered 2016-03-12: 1000 mg via INTRAVENOUS
  Filled 2016-03-12: qty 1100

## 2016-03-12 MED ORDER — SODIUM CHLORIDE 0.9 % IJ SOLN
INTRAMUSCULAR | Status: DC | PRN
  Administered 2016-03-12: 50 mL

## 2016-03-12 MED ORDER — PHENOL 1.4 % MT LIQD: 1.0000 | OROMUCOSAL | Status: DC | PRN

## 2016-03-12 MED ORDER — SODIUM CHLORIDE 0.9 % IR SOLN
Status: DC | PRN
  Administered 2016-03-12: 1000 mL

## 2016-03-12 MED ORDER — ONDANSETRON HCL 4 MG/2ML IJ SOLN
4.0000 mg | Freq: Four times a day (QID) | INTRAMUSCULAR | Status: DC | PRN
  Administered 2016-03-14: 4 mg via INTRAVENOUS
  Filled 2016-03-12: qty 2

## 2016-03-12 MED ORDER — ATORVASTATIN CALCIUM 20 MG PO TABS
20.0000 mg | ORAL_TABLET | Freq: Every day | ORAL | Status: DC
  Administered 2016-03-12 – 2016-03-13 (×2): 20 mg via ORAL
  Filled 2016-03-12 (×2): qty 1

## 2016-03-12 MED ORDER — PROPOFOL 10 MG/ML IV BOLUS
INTRAVENOUS | Status: AC
  Filled 2016-03-12: qty 40

## 2016-03-12 MED ORDER — LIDOCAINE 2% (20 MG/ML) 5 ML SYRINGE
INTRAMUSCULAR | Status: AC
  Filled 2016-03-12: qty 5

## 2016-03-12 MED ORDER — PROPOFOL 500 MG/50ML IV EMUL
INTRAVENOUS | Status: DC | PRN
  Administered 2016-03-12: 25 ug/kg/min via INTRAVENOUS

## 2016-03-12 MED ORDER — DEXAMETHASONE SODIUM PHOSPHATE 10 MG/ML IJ SOLN
10.0000 mg | Freq: Once | INTRAMUSCULAR | Status: AC
  Administered 2016-03-12: 10 mg via INTRAVENOUS

## 2016-03-12 MED ORDER — FENTANYL CITRATE (PF) 100 MCG/2ML IJ SOLN
INTRAMUSCULAR | Status: AC
  Filled 2016-03-12: qty 2

## 2016-03-12 MED ORDER — METOCLOPRAMIDE HCL 5 MG PO TABS: 5.0000 mg | ORAL_TABLET | Freq: Three times a day (TID) | ORAL | Status: DC | PRN

## 2016-03-12 MED ORDER — CEFAZOLIN SODIUM-DEXTROSE 2-4 GM/100ML-% IV SOLN
2.0000 g | Freq: Four times a day (QID) | INTRAVENOUS | Status: AC
  Administered 2016-03-12 – 2016-03-13 (×2): 2 g via INTRAVENOUS
  Filled 2016-03-12 (×2): qty 100

## 2016-03-12 MED ORDER — ASPIRIN 81 MG PO CHEW
81.0000 mg | CHEWABLE_TABLET | Freq: Two times a day (BID) | ORAL | Status: DC
  Administered 2016-03-12 – 2016-03-14 (×4): 81 mg via ORAL
  Filled 2016-03-12 (×4): qty 1

## 2016-03-12 MED ORDER — TAMSULOSIN HCL 0.4 MG PO CAPS
0.4000 mg | ORAL_CAPSULE | Freq: Every day | ORAL | Status: DC
  Administered 2016-03-12 – 2016-03-13 (×2): 0.4 mg via ORAL
  Filled 2016-03-12 (×2): qty 1

## 2016-03-12 MED ORDER — POLYETHYLENE GLYCOL 3350 17 G PO PACK
17.0000 g | PACK | Freq: Two times a day (BID) | ORAL | Status: DC
  Administered 2016-03-12 – 2016-03-14 (×3): 17 g via ORAL
  Filled 2016-03-12 (×2): qty 1

## 2016-03-12 MED ORDER — FENTANYL CITRATE (PF) 100 MCG/2ML IJ SOLN
INTRAMUSCULAR | Status: DC | PRN
  Administered 2016-03-12 (×2): 50 ug via INTRAVENOUS

## 2016-03-12 MED ORDER — MAGNESIUM CITRATE PO SOLN: 1.0000 | Freq: Once | ORAL | Status: DC | PRN

## 2016-03-12 MED ORDER — 0.9 % SODIUM CHLORIDE (POUR BTL) OPTIME
TOPICAL | Status: DC | PRN
  Administered 2016-03-12: 1000 mL

## 2016-03-12 MED ORDER — HYDROCODONE-ACETAMINOPHEN 7.5-325 MG PO TABS
1.0000 | ORAL_TABLET | ORAL | Status: DC
  Administered 2016-03-12 – 2016-03-14 (×8): 2 via ORAL
  Administered 2016-03-14 (×3): 1 via ORAL
  Filled 2016-03-12 (×3): qty 2
  Filled 2016-03-12: qty 1
  Filled 2016-03-12 (×6): qty 2
  Filled 2016-03-12: qty 1

## 2016-03-12 MED ORDER — METHOCARBAMOL 500 MG PO TABS
500.0000 mg | ORAL_TABLET | Freq: Four times a day (QID) | ORAL | Status: DC | PRN
  Administered 2016-03-14: 500 mg via ORAL
  Filled 2016-03-12 (×2): qty 1

## 2016-03-12 MED ORDER — MIDAZOLAM HCL 2 MG/2ML IJ SOLN
INTRAMUSCULAR | Status: AC
  Filled 2016-03-12: qty 2

## 2016-03-12 MED ORDER — METHOCARBAMOL 1000 MG/10ML IJ SOLN
500.0000 mg | Freq: Four times a day (QID) | INTRAVENOUS | Status: DC | PRN
  Filled 2016-03-12: qty 5

## 2016-03-12 MED ORDER — ALUM & MAG HYDROXIDE-SIMETH 200-200-20 MG/5ML PO SUSP: 30.0000 mL | ORAL | Status: DC | PRN

## 2016-03-12 MED ORDER — SODIUM CHLORIDE 0.9 % IV SOLN
INTRAVENOUS | Status: DC
  Administered 2016-03-12: 20:00:00 via INTRAVENOUS
  Filled 2016-03-12 (×3): qty 1000

## 2016-03-12 MED ORDER — MENTHOL 3 MG MT LOZG: 1.0000 | LOZENGE | OROMUCOSAL | Status: DC | PRN

## 2016-03-12 MED ORDER — ONDANSETRON HCL 4 MG PO TABS: 4.0000 mg | ORAL_TABLET | Freq: Four times a day (QID) | ORAL | Status: DC | PRN

## 2016-03-12 MED ORDER — FERROUS SULFATE 325 (65 FE) MG PO TABS
325.0000 mg | ORAL_TABLET | Freq: Three times a day (TID) | ORAL | Status: DC
  Administered 2016-03-12 – 2016-03-14 (×5): 325 mg via ORAL
  Filled 2016-03-12 (×5): qty 1

## 2016-03-12 MED ORDER — KETOROLAC TROMETHAMINE 30 MG/ML IJ SOLN
INTRAMUSCULAR | Status: AC
  Filled 2016-03-12: qty 1

## 2016-03-12 MED ORDER — LACTATED RINGERS IV SOLN
INTRAVENOUS | Status: DC
  Administered 2016-03-12: 13:00:00 via INTRAVENOUS

## 2016-03-12 MED ORDER — DOCUSATE SODIUM 100 MG PO CAPS
100.0000 mg | ORAL_CAPSULE | Freq: Two times a day (BID) | ORAL | Status: DC
  Administered 2016-03-12 – 2016-03-14 (×4): 100 mg via ORAL
  Filled 2016-03-12 (×4): qty 1

## 2016-03-12 MED ORDER — KETOROLAC TROMETHAMINE 30 MG/ML IJ SOLN
INTRAMUSCULAR | Status: DC | PRN
  Administered 2016-03-12: 30 mg

## 2016-03-12 MED ORDER — DIPHENHYDRAMINE HCL 25 MG PO CAPS: 25.0000 mg | ORAL_CAPSULE | Freq: Four times a day (QID) | ORAL | Status: DC | PRN

## 2016-03-12 MED ORDER — ONDANSETRON HCL 4 MG/2ML IJ SOLN
INTRAMUSCULAR | Status: AC
  Filled 2016-03-12: qty 2

## 2016-03-12 MED ORDER — ANASTROZOLE 1 MG PO TABS
1.0000 mg | ORAL_TABLET | ORAL | Status: DC
  Administered 2016-03-12: 1 mg via ORAL
  Filled 2016-03-12 (×2): qty 1

## 2016-03-12 MED ORDER — DEXAMETHASONE SODIUM PHOSPHATE 10 MG/ML IJ SOLN: 10.0000 mg | Freq: Once | INTRAMUSCULAR | Status: DC

## 2016-03-12 MED ORDER — ROPIVACAINE HCL 7.5 MG/ML IJ SOLN
INTRAMUSCULAR | Status: AC
  Filled 2016-03-12: qty 20

## 2016-03-12 MED ORDER — PROPOFOL 10 MG/ML IV BOLUS
INTRAVENOUS | Status: AC
  Filled 2016-03-12: qty 20

## 2016-03-12 MED ORDER — CARVEDILOL 12.5 MG PO TABS
12.5000 mg | ORAL_TABLET | Freq: Two times a day (BID) | ORAL | Status: DC
  Administered 2016-03-12 – 2016-03-14 (×4): 12.5 mg via ORAL
  Filled 2016-03-12 (×4): qty 1

## 2016-03-12 MED ORDER — BISACODYL 10 MG RE SUPP: 10.0000 mg | Freq: Every day | RECTAL | Status: DC | PRN

## 2016-03-12 MED ORDER — METOCLOPRAMIDE HCL 5 MG/ML IJ SOLN: 5.0000 mg | Freq: Three times a day (TID) | INTRAMUSCULAR | Status: DC | PRN

## 2016-03-12 MED ORDER — LOSARTAN POTASSIUM 50 MG PO TABS
100.0000 mg | ORAL_TABLET | Freq: Every day | ORAL | Status: DC
  Administered 2016-03-12 – 2016-03-14 (×3): 100 mg via ORAL
  Filled 2016-03-12 (×3): qty 2

## 2016-03-12 MED ORDER — ONDANSETRON HCL 4 MG/2ML IJ SOLN
INTRAMUSCULAR | Status: DC | PRN
  Administered 2016-03-12: 4 mg via INTRAVENOUS

## 2016-03-12 MED ORDER — BUPIVACAINE IN DEXTROSE 0.75-8.25 % IT SOLN
INTRATHECAL | Status: DC | PRN
  Administered 2016-03-12: 2 mL via INTRATHECAL

## 2016-03-12 MED ORDER — BUPIVACAINE HCL 0.25 % IJ SOLN
INTRAMUSCULAR | Status: DC | PRN
  Administered 2016-03-12: 30 mL

## 2016-03-12 MED ORDER — PANTOPRAZOLE SODIUM 40 MG PO TBEC
40.0000 mg | DELAYED_RELEASE_TABLET | Freq: Every day | ORAL | Status: DC
  Administered 2016-03-13 – 2016-03-14 (×2): 40 mg via ORAL
  Filled 2016-03-12 (×2): qty 1

## 2016-03-12 SURGICAL SUPPLY — 47 items
ADH SKN CLS APL DERMABOND .7 (GAUZE/BANDAGES/DRESSINGS) ×1
BAG DECANTER FOR FLEXI CONT (MISCELLANEOUS) IMPLANT
BAG SPEC THK2 15X12 ZIP CLS (MISCELLANEOUS)
BAG ZIPLOCK 12X15 (MISCELLANEOUS) IMPLANT
BANDAGE ACE 6X5 VEL STRL LF (GAUZE/BANDAGES/DRESSINGS) ×2 IMPLANT
BLADE SAW SGTL 13.0X1.19X90.0M (BLADE) ×2 IMPLANT
BOWL SMART MIX CTS (DISPOSABLE) ×2 IMPLANT
CAPT KNEE TOTAL 3 ATTUNE ×1 IMPLANT
CEMENT HV SMART SET (Cement) ×2 IMPLANT
CLOTH BEACON ORANGE TIMEOUT ST (SAFETY) ×2 IMPLANT
CUFF TOURN SGL QUICK 34 (TOURNIQUET CUFF) ×2
CUFF TRNQT CYL 34X4X40X1 (TOURNIQUET CUFF) ×1 IMPLANT
DECANTER SPIKE VIAL GLASS SM (MISCELLANEOUS) ×1 IMPLANT
DERMABOND ADVANCED (GAUZE/BANDAGES/DRESSINGS) ×1
DERMABOND ADVANCED .7 DNX12 (GAUZE/BANDAGES/DRESSINGS) ×1 IMPLANT
DRAPE U-SHAPE 47X51 STRL (DRAPES) ×2 IMPLANT
DRESSING AQUACEL AG SP 3.5X10 (GAUZE/BANDAGES/DRESSINGS) ×1 IMPLANT
DRSG AQUACEL AG ADV 3.5X10 (GAUZE/BANDAGES/DRESSINGS) ×1 IMPLANT
DRSG AQUACEL AG SP 3.5X10 (GAUZE/BANDAGES/DRESSINGS) ×2
DURAPREP 26ML APPLICATOR (WOUND CARE) ×4 IMPLANT
ELECT REM PT RETURN 9FT ADLT (ELECTROSURGICAL) ×2
ELECTRODE REM PT RTRN 9FT ADLT (ELECTROSURGICAL) ×1 IMPLANT
GLOVE BIOGEL M 7.0 STRL (GLOVE) ×1 IMPLANT
GLOVE BIOGEL PI IND STRL 7.5 (GLOVE) ×1 IMPLANT
GLOVE BIOGEL PI IND STRL 8.5 (GLOVE) ×1 IMPLANT
GLOVE BIOGEL PI INDICATOR 7.5 (GLOVE) ×8
GLOVE BIOGEL PI INDICATOR 8.5 (GLOVE)
GLOVE ECLIPSE 8.0 STRL XLNG CF (GLOVE) ×2 IMPLANT
GLOVE ORTHO TXT STRL SZ7.5 (GLOVE) ×4 IMPLANT
GOWN STRL REUS W/TWL LRG LVL3 (GOWN DISPOSABLE) ×3 IMPLANT
GOWN STRL REUS W/TWL XL LVL3 (GOWN DISPOSABLE) ×4 IMPLANT
HANDPIECE INTERPULSE COAX TIP (DISPOSABLE) ×2
MANIFOLD NEPTUNE II (INSTRUMENTS) ×2 IMPLANT
PACK TOTAL KNEE CUSTOM (KITS) ×2 IMPLANT
POSITIONER SURGICAL ARM (MISCELLANEOUS) ×2 IMPLANT
SET HNDPC FAN SPRY TIP SCT (DISPOSABLE) ×1 IMPLANT
SET PAD KNEE POSITIONER (MISCELLANEOUS) ×2 IMPLANT
SUT MNCRL AB 4-0 PS2 18 (SUTURE) ×2 IMPLANT
SUT VIC AB 1 CT1 36 (SUTURE) ×2 IMPLANT
SUT VIC AB 2-0 CT1 27 (SUTURE) ×6
SUT VIC AB 2-0 CT1 TAPERPNT 27 (SUTURE) ×3 IMPLANT
SUT VLOC 180 0 24IN GS25 (SUTURE) ×2 IMPLANT
SYR 50ML LL SCALE MARK (SYRINGE) ×2 IMPLANT
TRAY FOLEY W/METER SILVER 16FR (SET/KITS/TRAYS/PACK) ×2 IMPLANT
WATER STERILE IRR 1500ML POUR (IV SOLUTION) ×3 IMPLANT
WRAP KNEE MAXI GEL POST OP (GAUZE/BANDAGES/DRESSINGS) ×2 IMPLANT
YANKAUER SUCT BULB TIP 10FT TU (MISCELLANEOUS) ×2 IMPLANT

## 2016-03-12 NOTE — Interval H&P Note (Signed)
History and Physical Interval Note:  03/12/2016 12:55 PM  [name]  has presented today for surgery, with the diagnosis of RIGHT KNEE OA  The various methods of treatment have been discussed with the patient and family. After consideration of risks, benefits and other options for treatment, the patient has consented to  Procedure(s): RIGHT TOTAL KNEE ARTHROPLASTY (Right) as a surgical intervention .  The patient's history has been reviewed, patient examined, no change in status, stable for surgery.  I have reviewed the patient's chart and labs.  Questions were answered to the patient's satisfaction.     Mauri Pole

## 2016-03-12 NOTE — Anesthesia Preprocedure Evaluation (Addendum)
Anesthesia Evaluation  Patient identified by MRN, date of birth, ID band Patient awake    Reviewed: Allergy & Precautions, H&P , Patient's Chart, lab work & pertinent test results, reviewed documented beta blocker date and time   Airway Mallampati: II  TM Distance: >3 FB Neck ROM: full    Dental no notable dental hx.    Pulmonary former smoker,    Pulmonary exam normal breath sounds clear to auscultation       Cardiovascular Exercise Tolerance: Good hypertension, + CAD and +CHF   Rhythm:regular Rate:Normal     Neuro/Psych    GI/Hepatic   Endo/Other    Renal/GU      Musculoskeletal   Abdominal   Peds  Hematology   Anesthesia Other Findings HTN; EKG NSR with LBBB CAD Hx of CHF Echo 2013: EF 30% and worsening function therefore Cardiac MRI ordered and demonstrated EF 48% and stable findings with mild LV dysfunction No signif change in [place] status since last cardiologist visit this year Limited Mouth opening  Reproductive/Obstetrics                            Anesthesia Physical Anesthesia Plan  ASA: II  Anesthesia Plan: Spinal   Post-op Pain Management:    Induction:   Airway Management Planned:   Additional Equipment:   Intra-op Plan:   Post-operative Plan:   Informed Consent: I have reviewed the patients History and Physical, chart, labs and discussed the procedure including the risks, benefits and alternatives for the proposed anesthesia with the patient or authorized representative who has indicated his/her understanding and acceptance.   Dental Advisory Given  Plan Discussed with: CRNA  Anesthesia Plan Comments: (Lab work confirmed with CRNA in room. Platelets okay. Discussed spinal anesthetic, and patient consents to the procedure:  included risk of possible headache,backache, failed block, allergic reaction, and nerve injury. This patient was asked if she had any  questions or concerns before the procedure started. )        Anesthesia Quick Evaluation

## 2016-03-12 NOTE — Anesthesia Procedure Notes (Signed)
Procedure Name: MAC Performed by: West Pugh Pre-anesthesia Checklist: Patient identified, Emergency Drugs available, Suction available, Patient being monitored and Timeout performed Patient Re-evaluated:Patient Re-evaluated prior to inductionOxygen Delivery Method: Simple face mask Placement Confirmation: positive ETCO2 Dental Injury: Teeth and Oropharynx as per pre-operative assessment

## 2016-03-12 NOTE — Op Note (Signed)
NAME:  [name]                      MEDICAL RECORD NO.:  [id number]                             FACILITY:  [facility]      PHYSICIAN:  [name], M.D.  DATE OF BIRTH:  [birth year]      DATE OF PROCEDURE:  03/12/2016                                     OPERATIVE REPORT         PREOPERATIVE DIAGNOSIS:  Right knee osteoarthritis.      POSTOPERATIVE DIAGNOSIS:  Right knee osteoarthritis.      FINDINGS:  The patient was noted to have complete loss of cartilage and   bone-on-bone arthritis with associated osteophytes in the medial and patellofemoral compartments of   the knee with a significant synovitis and associated effusion.      PROCEDURE:  Right total knee replacement.      COMPONENTS USED:  DePuy Attune rotating platform posterior stabilized knee   system, a size 6N femur, 5 tibia, 6 PS AOX insert, and 38 anatomic patellar   button.      SURGEON:  [name], M.D.      ASSISTANT:  Billy Barrows, PA-C.      ANESTHESIA:  Spinal.      SPECIMENS:  None.      COMPLICATION:  None.      DRAINS:  None.  EBL: <100cc      TOURNIQUET TIME:   Total Tourniquet Time Documented: area (laterality) - 30 minutes Total: area (laterality) - 30 minutes  .      The patient was stable to the recovery room.      INDICATION FOR PROCEDURE:  [name] is a 70 y.o. male patient of   mine.  The patient had been seen, evaluated, and treated conservatively in the   office with medication, activity modification, and injections.  The patient had   radiographic changes of bone-on-bone arthritis with endplate sclerosis and osteophytes noted.      The patient failed conservative measures including medication, injections, and activity modification, and at this point was ready for more definitive measures.   Based on the radiographic changes and failed conservative measures, the patient   decided to proceed with total knee replacement.  Risks of infection,   DVT, component  failure, need for revision surgery, postop course, and   expectations were all   discussed and reviewed.  Consent was obtained for benefit of pain   relief.      PROCEDURE IN DETAIL:  The patient was brought to the operative theater.   Once adequate anesthesia, preoperative antibiotics, 2 gm of Ancef, 1 gm of Tranexamic Acid, and 10 mg of Decadron administered, the patient was positioned supine with the right thigh tourniquet placed.  The  right lower extremity was prepped and draped in sterile fashion.  A time-   out was performed identifying the patient, planned procedure, and   extremity.      The right lower extremity was placed in the [facility] leg holder.  The leg was   exsanguinated, tourniquet elevated to 250 mmHg.  A midline incision was   made followed by  median parapatellar arthrotomy.  Following initial   exposure, attention was first directed to the patella.  Precut   measurement was noted to be 23 mm.  I resected down to 14 mm and used a   38 anatomic patellar button to restore patellar height as well as cover the cut   surface.      The lug holes were drilled and a metal shim was placed to protect the   patella from retractors and saw blades.      At this point, attention was now directed to the femur.  The femoral   canal was opened with a drill, irrigated to try to prevent fat emboli.  An   intramedullary rod was passed at 3 degrees valgus, 9 mm of bone was   resected off the distal femur.  Following this resection, the tibia was   subluxated anteriorly.  Using the extramedullary guide, 2 mm of bone was resected off   the proximal medial tibia.  We confirmed the gap would be   stable medially and laterally with a size 5 spacer block as well as confirmed   the cut was perpendicular in the coronal plane, checking with an alignment rod.      Once this was done, I sized the femur to be a size 6 in the anterior-   posterior dimension, chose a narrow component based on medial  and   lateral dimension.  The size 6 rotation block was then pinned in   position anterior referenced using the C-clamp to set rotation.  The   anterior, posterior, and  chamfer cuts were made without difficulty nor   notching making certain that I was along the anterior cortex to help   with flexion gap stability.      The final box cut was made off the lateral aspect of distal femur.      At this point, the tibia was sized to be a size 5, the size 5 tray was   then pinned in position through the medial third of the tubercle,   drilled, and keel punched.  Trial reduction was now carried with a 6 femur,  5 tibia, a size 5 then 6 insert, and the 38 anatomic patella botton.  The knee was brought to   extension, full extension with good flexion stability with the patella   tracking through the trochlea without application of pressure.  Given   all these findings the femoral lug holes were drilled and then the trial components removed.  Final components were   opened and cement was mixed.  The knee was irrigated with normal saline   solution and pulse lavage.  The synovial lining was   then injected with 30 cc of 0.25% Marcaine with epinephrine and 1 cc of Toradol plus 30 cc of NS for a   total of 61 cc.      The knee was irrigated.  Final implants were then cemented onto clean and   dried cut surfaces of bone with the knee brought to extension with a size 6 trial insert.      Once the cement had fully cured, the excess cement was removed   throughout the knee.  I confirmed I was satisfied with the range of   motion and stability, and the final size 6 PS AOX insert was chosen.  It was   placed into the knee.      The tourniquet had been let down at 30 minutes.  No  significant   hemostasis required.  The   extensor mechanism was then reapproximated using #1 Vicryl and #0 V-lock sutures with the knee   in flexion.  The   remaining wound was closed with 2-0 Vicryl and running 4-0 Monocryl.    The knee was cleaned, dried, dressed sterilely using Dermabond and   Aquacel dressing.  The patient was then   brought to recovery room in stable condition, tolerating the procedure   well.   Please note that Physician Assistant, Billy Barrows, PA-C, was present for the entirety of the case, and was utilized for pre-operative positioning, peri-operative retractor management, general facilitation of the procedure.  He was also utilized for primary wound closure at the end of the case.              Billy Cassis Alvan Green, M.D.    03/12/2016 3:34 PM

## 2016-03-12 NOTE — Anesthesia Procedure Notes (Signed)
Spinal  Patient location during procedure: OR Preanesthetic Checklist Completed: patient identified, site marked, surgical consent, pre-op evaluation, timeout performed, IV checked, risks and benefits discussed and monitors and equipment checked Spinal Block Patient position: sitting Prep: DuraPrep Patient monitoring: heart rate, cardiac monitor, continuous pulse ox and blood pressure Approach: midline Location: L3-4 Injection technique: single-shot Needle Needle type: Sprotte  Needle gauge: 24 G Needle length: 9 cm Assessment Sensory level: T4 Additional Notes After [name] used 22GA, w/ attempts at L5-S1, 24GA sprotte by KJ....... to clear CSF

## 2016-03-12 NOTE — Anesthesia Procedure Notes (Signed)
Anesthesia Regional Block:  Adductor canal block  Pre-Anesthetic Checklist: ,, timeout performed, Correct Patient, Correct Site, Correct Laterality, Correct Procedure, Correct Position, risks and benefits discussed,,, at surgeon's request and post-op pain management  Laterality: Right  Prep: chloraprep       Needles:   Needle Type: Echogenic Needle     Needle Length: 9cm 9 cm Needle Gauge: 21 and 21 G    Additional Needles:  Procedures: ultrasound guided (picture in chart) Adductor canal block Narrative:  Injection made incrementally with aspirations every 5 mL. Anesthesiologist: Lyndle Herrlich  Additional Notes: .75% Naropin 20cc

## 2016-03-12 NOTE — Transfer of Care (Signed)
Immediate Anesthesia Transfer of Care Note  Patient: [name]  Procedure(s) Performed: Procedure(s): RIGHT TOTAL KNEE ARTHROPLASTY (Right)  Patient Location: PACU  Anesthesia Type:MAC and Spinal  Level of Consciousness:  sedated, patient cooperative and responds to stimulation  Airway & Oxygen Therapy:Patient Spontanous Breathing and Patient connected to face mask oxgen  Post-op Assessment:  Report given to PACU RN and Post -op Vital signs reviewed and stable  Post vital signs:  Reviewed and stable  Last Vitals:  Vitals:   03/12/16 1342 03/12/16 1343  BP:    Pulse: 72 70  Resp: 15 15  Temp:      Complications: No apparent anesthesia complications

## 2016-03-12 NOTE — Progress Notes (Signed)
AssistedDr.K.Jackson with right, ultrasound guided, adductor canal block. Side rails up, monitors on throughout procedure. See vital signs in flow sheet. Tolerated Procedure well.  

## 2016-03-12 NOTE — Anesthesia Postprocedure Evaluation (Signed)
Anesthesia Post Note  Patient: [name]  Procedure(s) Performed: Procedure(s) (LRB): RIGHT TOTAL KNEE ARTHROPLASTY (Right)  Patient location during evaluation: PACU Anesthesia Type: Spinal Level of consciousness: awake Pain management: satisfactory to patient Vital Signs Assessment: post-procedure vital signs reviewed and stable Respiratory status: spontaneous breathing Cardiovascular status: blood pressure returned to baseline Postop Assessment: no headache and spinal receding Anesthetic complications: no    Last Vitals:  Vitals:   03/12/16 1715 03/12/16 1729  BP: 138/78 138/82  Pulse: 64   Resp: 14 16  Temp: 36.4 C 36.7 C    Last Pain:  Vitals:   03/12/16 1729  TempSrc:   PainSc: 0-No pain                 Jeremie Abdelaziz EDWARD

## 2016-03-13 ENCOUNTER — Encounter (HOSPITAL_COMMUNITY): Payer: Self-pay | Admitting: Orthopedic Surgery

## 2016-03-13 LAB — BASIC METABOLIC PANEL
Anion gap: 10 (ref 5–15)
BUN: 23 mg/dL — AB (ref 6–20)
CO2: 23 mmol/L (ref 22–32)
CREATININE: 0.87 mg/dL (ref 0.61–1.24)
Calcium: 9 mg/dL (ref 8.9–10.3)
Chloride: 104 mmol/L (ref 101–111)
GFR calc Af Amer: >60 mL/min (ref >60)
Glucose, Bld: 172 mg/dL — ABNORMAL HIGH (ref 65–99)
POTASSIUM: 3.9 mmol/L (ref 3.5–5.1)
SODIUM: 137 mmol/L (ref 135–145)

## 2016-03-13 LAB — CBC
HCT: 37.6 % — ABNORMAL LOW (ref 39.0–52.0)
Hemoglobin: 12.5 g/dL — ABNORMAL LOW (ref 13.0–17.0)
MCH: 29.9 pg (ref 26.0–34.0)
MCHC: 33.2 g/dL (ref 30.0–36.0)
MCV: 90 fL (ref 78.0–100.0)
PLATELETS: 243 10*3/uL (ref 150–400)
RBC: 4.18 MIL/uL — AB (ref 4.22–5.81)
RDW: 13.1 % (ref 11.5–15.5)
WBC: 12.8 10*3/uL — ABNORMAL HIGH (ref 4.0–10.5)

## 2016-03-13 NOTE — Care Management Note (Signed)
Case Management Note  Patient Details  Name: [name] MRN: [id number] Date of Birth: [birth year]  Subjective/Objective:  70 y.o. M admitted 03/12/2016 for R TKA. PT recommending OutPT PT at discharge. Has all DME at home, ordered prior to admission. Spouse will assist during immediate post op period. No further CM needs.                   Action/Plan:Discharge Home.    Expected Discharge Date:                  Expected Discharge Plan:  Home/Self Care (Home with Outpt PT; to start at [facility] on 10/27)  In-House Referral:  NA  Discharge planning Services  CM Consult  Post Acute Care Choice:  NA Choice offered to:  NA  DME Arranged:  N/A (Has RW and 3n1) DME Agency:  NA  HH Arranged:  NA HH Agency:  NA  Status of Service:  Completed, signed off  If discussed at [place] of Stay Meetings, dates discussed:    Additional Comments:  Delrae Sawyers, RN 03/13/2016, 9:44 AM

## 2016-03-13 NOTE — Progress Notes (Signed)
Physical Therapy Treatment Patient Details Name: [name] MRN: [id number] DOB: 1946/04/26 Today's Date: 03/13/2016    History of Present Illness s/p R TKA    PT Comments    Steady progress with mobility and therex but ltd by pain tolerance  Follow Up Recommendations  Outpatient PT     Equipment Recommendations  Rolling walker with 5" wheels    Recommendations for Other Services OT consult     Precautions / Restrictions Precautions Precautions: Knee;Fall Restrictions Weight Bearing Restrictions: No Other Position/Activity Restrictions: WBAT    Mobility  Bed Mobility Overal bed mobility: Needs Assistance Bed Mobility: Supine to Sit     Supine to sit: Min assist     General bed mobility comments: NT - Pt in chair and requests to return to same  Transfers Overall transfer level: Needs assistance Equipment used: Rolling walker (2 wheeled) Transfers: Sit to/from Stand Sit to Stand: Min guard         General transfer comment: cues for LE management and use of UEs to self assist  Ambulation/Gait Ambulation/Gait assistance: Min assist;Min guard Ambulation Distance (Feet): 75 Feet Assistive device: Rolling walker (2 wheeled) Gait Pattern/deviations: Step-to pattern;Decreased step length - right;Decreased step length - left;Shuffle;Trunk flexed Gait velocity: decr Gait velocity interpretation: Below normal speed for age/gender General Gait Details: cues for sequence, posture and position from [employer]            Wheelchair Mobility    Modified Rankin (Stroke Patients Only)       Balance                                    Cognition Arousal/Alertness: Awake/alert Behavior During Therapy: WFL for tasks assessed/performed Overall Cognitive Status: Within Functional Limits for tasks assessed                      Exercises Total Joint Exercises Ankle Circles/Pumps: AROM;Both;15 reps;Supine Quad Sets: AROM;Both;10  reps;Supine Heel Slides: AAROM;Right;15 reps;Supine Hip ABduction/ADduction: AAROM;Right;10 reps;Supine Straight Leg Raises: AAROM;AROM;15 reps;Supine;Right    General Comments        Pertinent Vitals/Pain Pain Assessment: 0-10 Pain Score: 6  Pain Location: R knee Pain Descriptors / Indicators: Aching;Sore Pain Intervention(s): Monitored during session;Premedicated before session;Limited activity within patient's tolerance;Ice applied    Home Living Family/patient expects to be discharged to:: Private residence Living Arrangements: Spouse/significant other Available Help at Discharge: Family;Available 24 hours/day Type of Home: House Home Access: Stairs to enter   Home Layout: One level Home Equipment: Environmental consultant - 4 wheels Additional Comments: elevated toilet at home    Prior Function Level of Independence: Independent          PT Goals (current goals can now be found in the care plan section) Acute Rehab PT Goals Patient Stated Goal: home tomorrow PT Goal Formulation: With patient Time For Goal Achievement: 03/16/16 Potential to Achieve Goals: Good Progress towards PT goals: Progressing toward goals    Frequency    7X/week      PT Plan Current plan remains appropriate    Co-evaluation             End of Session Equipment Utilized During Treatment: Gait belt Activity Tolerance: Patient tolerated treatment well Patient left: in chair;with call bell/phone within reach;with family/visitor present     Time: [id number] PT Time Calculation (min) (ACUTE ONLY): 28 min  Charges:  $Gait Training:  8-22 mins $Therapeutic Exercise: 8-22 mins                    G Codes:      Annalaya Wile 2016-04-06, 2:59 PM

## 2016-03-13 NOTE — Progress Notes (Signed)
     Subjective: 1 Day Post-Op Procedure(s) (LRB): RIGHT TOTAL KNEE ARTHROPLASTY (Right)   Patient reports pain as moderate, pain controlled. No events throughout the night.   Objective:   VITALS:   Vitals:   03/13/16 0108 03/13/16 0600  BP: (!) 116/49 109/60  Pulse: 94 96  Resp: 16 20  Temp: 98.1 F (36.7 C) 98.2 F (36.8 C)    Dorsiflexion/Plantar flexion intact Incision: dressing C/D/I No cellulitis present Compartment soft  LABS  Recent Labs  03/13/16 0419  HGB 12.5*  HCT 37.6*  WBC 12.8*  PLT 243     Recent Labs  03/13/16 0419  NA 137  K 3.9  BUN 23*  CREATININE 0.87  GLUCOSE 172*     Assessment/Plan: 1 Day Post-Op Procedure(s) (LRB): RIGHT TOTAL KNEE ARTHROPLASTY (Right) Foley cath d/c'ed Advance diet Up with therapy D/C IV fluids Discharge home when ready  Overweight (BMI 25-29.9) Estimated body mass index is 29.29 kg/m as calculated from the following:   Height as of this encounter: 5\' 7"  (1.702 m).   Weight as of this encounter: 84.8 kg (187 lb). Patient also counseled that weight may inhibit the healing process Patient counseled that losing weight will help with future health issues        [place] Pugh. Kammi Hechler   PAC  03/13/2016, 9:04 AM

## 2016-03-13 NOTE — Progress Notes (Signed)
Occupational Therapy Evaluation Patient Details Name: [name] MRN: [id number] DOB: Feb 18, 1946 Today's Date: 03/13/2016    History of Present Illness s/p R TKA   Clinical Impression   All OT education completed and pt questions answered. No further OT needs at this time. Will sign off.    Follow Up Recommendations  No OT follow up;Supervision/Assistance - 24 hour    Equipment Recommendations  None recommended by OT    Recommendations for Other Services       Precautions / Restrictions Precautions Precautions: Knee;Fall Restrictions Weight Bearing Restrictions: No Other Position/Activity Restrictions: WBAT      Mobility Bed Mobility               General bed mobility comments: NT -- OOB in recliner upon arrival  Transfers Overall transfer level: Needs assistance Equipment used: Rolling walker (2 wheeled) Transfers: Sit to/from Stand Sit to Stand: Min guard              Balance                                            ADL Overall ADL's : Needs assistance/impaired Eating/Feeding: Independent;Sitting   Grooming: Wash/dry hands;Min guard;Standing       Lower Body Bathing: Minimal assistance;Sit to/from stand       Lower Body Dressing: Minimal assistance;Sit to/from stand   Toilet Transfer: Min guard;Ambulation;BSC;RW   Toileting- Water quality scientist and Hygiene: Min guard;Sit to/from stand   Tub/ Shower Transfer: Walk-in shower;Min guard;Ambulation;Rolling walker   Functional mobility during ADLs: Min guard;Rolling walker General ADL Comments: All ADL education completed.     Vision     Perception     Praxis      Pertinent Vitals/Pain Pain Assessment: 0-10 Pain Score: 3  Pain Location: R knee Pain Descriptors / Indicators: Aching;Sore Pain Intervention(s): Monitored during session;Repositioned;Ice applied     Hand Dominance     Extremity/Trunk Assessment Upper Extremity Assessment Upper  Extremity Assessment: Overall WFL for tasks assessed   Lower Extremity Assessment Lower Extremity Assessment: Defer to PT evaluation       Communication Communication Communication: No difficulties   Cognition Arousal/Alertness: Awake/alert Behavior During Therapy: WFL for tasks assessed/performed Overall Cognitive Status: Within Functional Limits for tasks assessed                     General Comments       Exercises       Shoulder Instructions      Home Living Family/patient expects to be discharged to:: Private residence Living Arrangements: Spouse/significant other Available Help at Discharge: Family;Available 24 hours/day Type of Home: House (condo)             Bathroom Shower/Tub: Walk-in Psychologist, prison and probation services: Handicapped height Bathroom Accessibility: Yes How Accessible: Accessible via walker Home Equipment: Environmental consultant - 4 wheels   Additional Comments: elevated toilet at home      Prior Functioning/Environment Level of Independence: Independent                 OT Problem List: Decreased strength;Decreased range of motion;Decreased knowledge of use of DME or AE;Pain   OT Treatment/Interventions:      OT Goals(Current goals can be found in the care plan section) Acute Rehab OT Goals Patient Stated Goal: home tomorrow OT Goal Formulation: All assessment and education complete,  DC therapy  OT Frequency:     Barriers to D/C:            Co-evaluation              End of Session Equipment Utilized During Treatment: Surveyor, mining Communication: Mobility status  Activity Tolerance: Patient tolerated treatment well Patient left: in chair;with call bell/phone within reach;with chair alarm set;with family/visitor present   Time: 1116-1130 OT Time Calculation (min): 14 min Charges:  OT General Charges $OT Visit: 1 Procedure OT Evaluation $OT Eval Low Complexity: 1 Procedure G-Codes:    Chalise Pe A March 26, 2016, 11:37  AM

## 2016-03-13 NOTE — Progress Notes (Signed)
11:08: CM notified [facility] that PT is recommending RW as the RW that the pt has at home is 4- wheeled. Made Reggie aware of need.

## 2016-03-13 NOTE — Evaluation (Signed)
Physical Therapy Evaluation Patient Details Name: [name] MRN: [id number] DOB: May 11, 1946 Today's Date: 03/13/2016   History of Present Illness  s/p R TKA  Clinical Impression  Pt s/p R TKR presents with decreased R LE strength/ROM and post op pain limiting functional mobility.  Pt should progress to dc home with family assist and plans follow PT as out-pt beginning 03/15/16    Follow Up Recommendations Outpatient PT    Equipment Recommendations  Rolling walker with 5" wheels    Recommendations for Other Services OT consult     Precautions / Restrictions Precautions Precautions: Knee;Fall Restrictions Weight Bearing Restrictions: No Other Position/Activity Restrictions: WBAT      Mobility  Bed Mobility Overal bed mobility: Needs Assistance Bed Mobility: Supine to Sit     Supine to sit: Min assist     General bed mobility comments: cues for sequence and use of L LE to self assist  Transfers Overall transfer level: Needs assistance Equipment used: Rolling walker (2 wheeled) Transfers: Sit to/from Stand Sit to Stand: Min assist         General transfer comment: cues for LE management and use of UEs to self assist  Ambulation/Gait Ambulation/Gait assistance: Min assist Ambulation Distance (Feet): 36 Feet Assistive device: Rolling walker (2 wheeled) Gait Pattern/deviations: Step-to pattern;Decreased step length - right;Decreased step length - left;Shuffle;Trunk flexed Gait velocity: decr Gait velocity interpretation: Below normal speed for age/gender General Gait Details: cues for sequence, posture and position from [employer]            Wheelchair Mobility    Modified Rankin (Stroke Patients Only)       Balance                                             Pertinent Vitals/Pain Pain Assessment: 0-10 Pain Score: 6  Pain Location: R knee Pain Descriptors / Indicators: Aching;Sore Pain Intervention(s): Limited  activity within patient's tolerance;Monitored during session;Premedicated before session;Ice applied    Home Living Family/patient expects to be discharged to:: Private residence Living Arrangements: Spouse/significant other Available Help at Discharge: Family;Available 24 hours/day Type of Home: House Home Access: Stairs to enter   [employer] of Steps: 1+1 Home Layout: One level Home Equipment: Environmental consultant - 4 wheels Additional Comments: elevated toilet at home    Prior Function Level of Independence: Independent               Hand Dominance        Extremity/Trunk Assessment   Upper Extremity Assessment: Overall WFL for tasks assessed           Lower Extremity Assessment: RLE deficits/detail RLE Deficits / Details: 3-/5 quads with AAROM at knee -10 - 55    Cervical / Trunk Assessment: Normal  Communication   Communication: No difficulties  Cognition Arousal/Alertness: Awake/alert Behavior During Therapy: WFL for tasks assessed/performed Overall Cognitive Status: Within Functional Limits for tasks assessed                      General Comments      Exercises Total Joint Exercises Ankle Circles/Pumps: AROM;Both;15 reps;Supine Quad Sets: AROM;Both;10 reps;Supine Heel Slides: AAROM;Right;15 reps;Supine Hip ABduction/ADduction: AAROM;Right;10 reps;Supine   Assessment/Plan    PT Assessment Patient needs continued PT services  PT Problem List Decreased strength;Decreased range of motion;Decreased activity tolerance;Decreased balance;Decreased mobility;Decreased knowledge of use  of DME;Pain          PT Treatment Interventions DME instruction;Gait training;Stair training;Functional mobility training;Therapeutic activities;Therapeutic exercise;Patient/family education    PT Goals (Current goals can be found in the Care Plan section)  Acute Rehab PT Goals Patient Stated Goal: home tomorrow PT Goal Formulation: With patient Time For Goal  Achievement: 03/16/16 Potential to Achieve Goals: Good    Frequency 7X/week   Barriers to discharge        Co-evaluation               End of Session Equipment Utilized During Treatment: Gait belt Activity Tolerance: Patient tolerated treatment well Patient left: in chair;with call bell/phone within reach;with family/visitor present Nurse Communication: Mobility status         Time: [id number] PT Time Calculation (min) (ACUTE ONLY): 38 min   Charges:   PT Evaluation $PT Eval Low Complexity: 1 Procedure PT Treatments $Gait Training: 8-22 mins $Therapeutic Exercise: 8-22 mins   PT G Codes:        Wilbur [place] 2016/03/15, 12:25 PM

## 2016-03-14 DIAGNOSIS — E663 Overweight: Secondary | ICD-10-CM | POA: Diagnosis present

## 2016-03-14 LAB — CBC
HCT: 33.2 % — ABNORMAL LOW (ref 39.0–52.0)
HEMOGLOBIN: 11.2 g/dL — AB (ref 13.0–17.0)
MCH: 30.4 pg (ref 26.0–34.0)
MCHC: 33.7 g/dL (ref 30.0–36.0)
MCV: 90.2 fL (ref 78.0–100.0)
PLATELETS: 196 10*3/uL (ref 150–400)
RBC: 3.68 MIL/uL — AB (ref 4.22–5.81)
RDW: 13.3 % (ref 11.5–15.5)
WBC: 9.4 10*3/uL (ref 4.0–10.5)

## 2016-03-14 LAB — BASIC METABOLIC PANEL
Anion gap: 5 (ref 5–15)
BUN: 27 mg/dL — AB (ref 6–20)
CHLORIDE: 104 mmol/L (ref 101–111)
CO2: 28 mmol/L (ref 22–32)
CREATININE: 1.03 mg/dL (ref 0.61–1.24)
Calcium: 8.9 mg/dL (ref 8.9–10.3)
GFR calc Af Amer: >60 mL/min (ref >60)
GFR calc non Af Amer: >60 mL/min (ref >60)
GLUCOSE: 128 mg/dL — AB (ref 65–99)
POTASSIUM: 3.7 mmol/L (ref 3.5–5.1)
SODIUM: 137 mmol/L (ref 135–145)

## 2016-03-14 MED ORDER — HYDROCODONE-ACETAMINOPHEN 7.5-325 MG PO TABS: 1.0000 | ORAL_TABLET | ORAL | 0 refills | Status: DC | PRN

## 2016-03-14 MED ORDER — DOCUSATE SODIUM 100 MG PO CAPS: 100.0000 mg | ORAL_CAPSULE | Freq: Two times a day (BID) | ORAL | 0 refills | Status: DC

## 2016-03-14 MED ORDER — FERROUS SULFATE 325 (65 FE) MG PO TABS: 325.0000 mg | ORAL_TABLET | Freq: Three times a day (TID) | ORAL | 3 refills | Status: DC

## 2016-03-14 MED ORDER — POLYETHYLENE GLYCOL 3350 17 G PO PACK: 17.0000 g | PACK | Freq: Two times a day (BID) | ORAL | 0 refills | Status: DC

## 2016-03-14 MED ORDER — ASPIRIN 81 MG PO CHEW: 81.0000 mg | CHEWABLE_TABLET | Freq: Two times a day (BID) | ORAL | 0 refills | Status: AC

## 2016-03-14 MED ORDER — METHOCARBAMOL 500 MG PO TABS: 500.0000 mg | ORAL_TABLET | Freq: Four times a day (QID) | ORAL | 0 refills | Status: DC | PRN

## 2016-03-14 NOTE — Discharge Instructions (Signed)

## 2016-03-14 NOTE — Progress Notes (Signed)
     Subjective: 2 Days Post-Op Procedure(s) (LRB): RIGHT TOTAL KNEE ARTHROPLASTY (Right)   Patient reports pain as mild, pain controlled. Some issues with increased pain after increased activity.  Discussed changing analgesic medications, however he would like to stay on his current medications. No events throughout the night. Ready to be discharged home.   Objective:   VITALS:   Vitals:   03/13/16 2216 03/14/16 0532  BP:  (!) 131/56  Pulse:  73  Resp: 18 16  Temp:  98.6 F (37 C)    Dorsiflexion/Plantar flexion intact Incision: dressing C/D/I No cellulitis present Compartment soft  LABS  Recent Labs  03/13/16 0419 03/14/16 0437  HGB 12.5* 11.2*  HCT 37.6* 33.2*  WBC 12.8* 9.4  PLT 243 196     Recent Labs  03/13/16 0419 03/14/16 0437  NA 137 137  K 3.9 3.7  BUN 23* 27*  CREATININE 0.87 1.03  GLUCOSE 172* 128*     Assessment/Plan: 2 Days Post-Op Procedure(s) (LRB): RIGHT TOTAL KNEE ARTHROPLASTY (Right) Up with therapy Discharge home Follow up in 2 weeks at [facility]. Follow up with OLIN,Zohair Epp D in 2 weeks.  Contact information:  [facility] [address], [address] [place] Jilleen Essner   PAC  03/14/2016, 7:05 AM

## 2016-03-14 NOTE — Progress Notes (Signed)
Discharge teaching completed with patient and spouse. Patient understands all discharge instructions, medications, and precautions. Has already called to set up a follow up appointment with Dr. Alvan Dame. Prescriptions given to patient. Patient scheduled to attend outpatient PT. No DME needs. No questions or concerns at this time, patient ready for discharge. Leaving via wheelchair with wife.

## 2016-03-14 NOTE — Progress Notes (Signed)
Physical Therapy Treatment Patient Details Name: [name] MRN: [id number] DOB: [birth year] Today's Date: 03/14/2016    History of Present Illness s/p R TKA    PT Comments    Pt progressing slowly 2* c/o fatigue but able to manage step up into house and ambulate short distance.  Follow Up Recommendations  Outpatient PT     Equipment Recommendations  Rolling walker with 5" wheels    Recommendations for Other Services OT consult     Precautions / Restrictions Precautions Precautions: Knee;Fall Restrictions Weight Bearing Restrictions: No Other Position/Activity Restrictions: WBAT    Mobility  Bed Mobility Overal bed mobility: Needs Assistance Bed Mobility: Sit to Supine       Sit to supine: Min guard   General bed mobility comments: cues for sequence and min guard with R LE  Transfers Overall transfer level: Needs assistance Equipment used: Rolling walker (2 wheeled) Transfers: Sit to/from Stand Sit to Stand: Supervision         General transfer comment: min cues for LE management and use of UEs to self assist  Ambulation/Gait Ambulation/Gait assistance: Min guard;Supervision Ambulation Distance (Feet): 70 Feet Assistive device: Rolling walker (2 wheeled) Gait Pattern/deviations: Step-to pattern;Decreased step length - right;Decreased step length - left;Shuffle;Trunk flexed Gait velocity: decr Gait velocity interpretation: Below normal speed for age/gender General Gait Details: Increased time and multiple short standing rests to complete task   Stairs Stairs: Yes Stairs assistance: Min assist Stair Management: No rails;Step to pattern;Backwards;With walker Number of Stairs: 1 General stair comments: cues for sequence and foot/RW placement.  Spouse present.  Pt declines to attempt 2nd time 2* fatigue  Wheelchair Mobility    Modified Rankin (Stroke Patients Only)       Balance                                    Cognition  Arousal/Alertness: Awake/alert Behavior During Therapy: WFL for tasks assessed/performed Overall Cognitive Status: Within Functional Limits for tasks assessed                      Exercises Total Joint Exercises Ankle Circles/Pumps: AROM;Both;15 reps;Supine Quad Sets: AROM;Both;10 reps;Supine Short Arc Quad: AAROM;Both;10 reps;Seated Heel Slides: AAROM;Right;15 reps;Supine Straight Leg Raises: AAROM;Supine;Right;20 reps Goniometric ROM: AAROM R knee -10 - 55    General Comments        Pertinent Vitals/Pain Pain Assessment: 0-10 Pain Score: 7  Pain Location: R knee Pain Descriptors / Indicators: Aching;Burning Pain Intervention(s): Limited activity within patient's tolerance;Monitored during session;Premedicated before session;Ice applied    Home Living                      Prior Function            PT Goals (current goals can now be found in the care plan section) Acute Rehab PT Goals Patient Stated Goal: less pain PT Goal Formulation: With patient Time For Goal Achievement: 03/16/16 Potential to Achieve Goals: Good Progress towards PT goals: Progressing toward goals    Frequency    7X/week      PT Plan Current plan remains appropriate    Co-evaluation             End of Session Equipment Utilized During Treatment: Gait belt Activity Tolerance: Patient limited by fatigue Patient left: in bed;with call bell/phone within reach;with family/visitor present     Time: [id number]  PT Time Calculation (min) (ACUTE ONLY): 19 min  Charges:  $Gait Training: 8-22 mins $Therapeutic Exercise: 23-37 mins                    G Codes:      Kona Lover 04/06/16, 12:05 PM

## 2016-03-14 NOTE — Progress Notes (Signed)
Physical Therapy Treatment Patient Details Name: [name] MRN: [id number] DOB: [birth year] Today's Date: 13-Apr-2016    History of Present Illness s/p R TKA    PT Comments    Pt performed therex program but deferred ambulation/stairs at pt request 2* c/o fatigue/nausea.    Follow Up Recommendations  Outpatient PT     Equipment Recommendations  Rolling walker with 5" wheels    Recommendations for Other Services OT consult     Precautions / Restrictions Precautions Precautions: Knee;Fall Restrictions Weight Bearing Restrictions: No Other Position/Activity Restrictions: WBAT    Mobility  Bed Mobility               General bed mobility comments: Pt up in chair and declines back to bed  Transfers Overall transfer level: Needs assistance Equipment used: Rolling walker (2 wheeled) Transfers: Sit to/from Stand Sit to Stand: Min guard         General transfer comment: min cues for LE management and use of UEs to self assist  Ambulation/Gait             General Gait Details: Pt stood only to complete lower body dressing and c/o fatigue and nausea  - returned to sitting   Stairs            Wheelchair Mobility    Modified Rankin (Stroke Patients Only)       Balance                                    Cognition Arousal/Alertness: Awake/alert Behavior During Therapy: WFL for tasks assessed/performed Overall Cognitive Status: Within Functional Limits for tasks assessed                      Exercises Total Joint Exercises Ankle Circles/Pumps: AROM;Both;15 reps;Supine Quad Sets: AROM;Both;10 reps;Supine Short Arc Quad: AAROM;Both;10 reps;Seated Heel Slides: AAROM;Right;15 reps;Supine Straight Leg Raises: AAROM;Supine;Right;20 reps Goniometric ROM: AAROM R knee -10 - 55    General Comments        Pertinent Vitals/Pain Pain Assessment: 0-10 Pain Score: 7  Pain Location: R knee Pain Descriptors / Indicators:  Aching;Sore Pain Intervention(s): Limited activity within patient's tolerance;Monitored during session;Premedicated before session;Ice applied    Home Living                      Prior Function            PT Goals (current goals can now be found in the care plan section) Acute Rehab PT Goals Patient Stated Goal: less pain PT Goal Formulation: With patient Time For Goal Achievement: 03/16/16 Potential to Achieve Goals: Good Progress towards PT goals: Progressing toward goals    Frequency    7X/week      PT Plan Current plan remains appropriate    Co-evaluation             End of Session   Activity Tolerance: Patient tolerated treatment well Patient left: in chair;with call bell/phone within reach;with family/visitor present     Time: 0910-0940 PT Time Calculation (min) (ACUTE ONLY): 30 min  Charges:  $Therapeutic Exercise: 23-37 mins                    G Codes:      [name] 2016-04-13, 11:55 AM

## 2016-03-15 ENCOUNTER — Telehealth: Payer: Self-pay | Admitting: Radiology

## 2016-03-15 DIAGNOSIS — M1711 Unilateral primary osteoarthritis, right knee: Secondary | ICD-10-CM | POA: Diagnosis not present

## 2016-03-15 NOTE — Telephone Encounter (Signed)
Received refill request via fax for Tramadol [facility] mail order

## 2016-03-17 NOTE — Telephone Encounter (Signed)
Patient would like a refill on Tramadol last visit - 10/18/15 Next visit 04/18/16 labs 05/03/2015 - UDS NA 05/02/2015  Ok to refill tramadol?

## 2016-03-18 DIAGNOSIS — M1711 Unilateral primary osteoarthritis, right knee: Secondary | ICD-10-CM | POA: Diagnosis not present

## 2016-03-18 NOTE — Telephone Encounter (Signed)
Ok to refill tramadol 

## 2016-03-19 MED ORDER — TRAMADOL HCL 50 MG PO TABS: 50.0000 mg | ORAL_TABLET | Freq: Three times a day (TID) | ORAL | 0 refills | Status: DC | PRN

## 2016-03-20 DIAGNOSIS — M1711 Unilateral primary osteoarthritis, right knee: Secondary | ICD-10-CM | POA: Diagnosis not present

## 2016-03-21 ENCOUNTER — Telehealth: Payer: Self-pay | Admitting: Radiology

## 2016-03-21 MED ORDER — NABUMETONE 500 MG PO TABS: 500.0000 mg | ORAL_TABLET | Freq: Two times a day (BID) | ORAL | 0 refills | Status: DC

## 2016-03-21 NOTE — Telephone Encounter (Signed)
Refill request received via fax for Relafen 500

## 2016-03-21 NOTE — Telephone Encounter (Signed)
Last visit 10/18/15 Next visit 03/18/16 Labs BMP and CBC 03/14/16   Missing hepatic fx testing, can do at follow up per [place] to refill per Dr Estanislado Pandy

## 2016-03-22 DIAGNOSIS — Z96651 Presence of right artificial knee joint: Secondary | ICD-10-CM | POA: Diagnosis not present

## 2016-03-22 DIAGNOSIS — Z471 Aftercare following joint replacement surgery: Secondary | ICD-10-CM | POA: Diagnosis not present

## 2016-03-24 NOTE — Discharge Summary (Signed)
Physician Discharge Summary  Patient ID: [name] MRN: [id number] DOB/AGE: 07-11-1945 70 y.o.  Admit date: 03/12/2016 Discharge date: 03/14/2016   Procedures:  Procedure(s) (LRB): RIGHT TOTAL KNEE ARTHROPLASTY (Right)  Attending Physician:  Dr. Paralee Cancel   Admission Diagnoses:   Right knee primary OA / pain  Discharge Diagnoses:  Principal Problem:   S/P right TKA Active Problems:   Overweight (BMI 25.0-29.9)  Past Medical History:  Diagnosis Date  . Allergic rhinitis   . Anginal pain ([place]) 05/26/14   chest pain after chasing dog  . CAD (coronary artery disease)    hx of stent- 2005 RCA  . CHF (congestive heart failure) ([place])   . Chronic back pain    intermittent  . Constipation   . Dizziness   . Dysrhythmia    right bundle branch block   . Esophageal stricture   . GERD (gastroesophageal reflux disease)   . Hemoptysis   . Hiatal hernia   . History of colonic polyps    hyperplastic  . HTN (hypertension)   . Hyperlipidemia   . Hypertrophy of prostate with urinary obstruction and other lower urinary tract symptoms (LUTS)   . OA (osteoarthritis)   . OSA (obstructive sleep apnea)    cpap- 10   . 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 Pneumonia   . Shortness of breath    with exertion  . Testicular hypofunction     HPI:    [name], 70 y.o. male, has a history of pain and functional disability in the right knee due to arthritis and has failed non-surgical conservative treatments for greater than 12 weeks to include NSAID's and/or analgesics, corticosteriod injections, viscosupplementation injections, use of assistive devices and activity modification.  Onset of symptoms was gradual, starting 13+ years ago with gradually worsening course since that time. The patient noted prior procedures on the knee to include  arthroscopy on the right knee(s).  Patient currently  rates pain in the right knee(s) at 10 out of 10 with activity. Patient has worsening of pain with activity and weight bearing, pain that interferes with activities of daily living, pain with passive range of motion, crepitus and joint swelling.  Patient has evidence of periarticular osteophytes and joint space narrowing by imaging studie There is no active infection.   Risks, benefits and expectations were discussed with the patient.  Risks including but not limited to the risk of anesthesia, blood clots, nerve damage, blood vessel damage, failure of the prosthesis, infection and up to and including death.  Patient understand the risks, benefits and expectations and wishes to proceed with surgery.   PCP: [name], MD   Discharged Condition: good  Hospital Course:  Patient underwent the above stated procedure on 03/12/2016. Patient tolerated the procedure well and brought to the recovery room in good condition and subsequently to the floor.  POD #1 BP: 109/60 ; Pulse: 96 ; Temp: 98.2 F (36.8 C) ; Resp: 20 Patient reports pain as moderate, pain controlled. No events throughout the night.  Dorsiflexion/plantar flexion intact, incision: dressing C/D/I, no cellulitis present and compartment soft.   LABS  Basename    HGB     12.5  HCT     37.6   POD #2  BP: 131/56 ; Pulse: 73 ; Temp: 98.6 F (37 C) ; Resp: 16 Patient reports pain as mild, pain controlled. Some issues with increased  pain after increased activity.  Discussed changing analgesic medications, however he would like to stay on his current medications. No events throughout the night. Ready to be discharged home.  Dorsiflexion/plantar flexion intact, incision: dressing C/D/I, no cellulitis present and compartment soft.   LABS  Basename    HGB     11.2  HCT     33.2    Discharge Exam: General appearance: alert, cooperative and no distress Extremities: Homans sign is negative, no sign of DVT, no edema, redness or tenderness in  the calves or thighs and no ulcers, gangrene or trophic changes  Disposition: Home with follow up in 2 weeks   [place] .   Why:  RW has been ordered to be delivered to your room prior to your discharge. Please DO NOT USE the 4 Wheeled Walker that you have in your home until the Therapist tells you it is safe to do so.  Contact information: [address] [place] [address] [phone]        Mauri Pole, MD. Schedule an appointment as soon as possible for a visit in 2 week(s).   Specialty:  Orthopedic Surgery Contact information: [address] [place] [address] [phone]           Discharge Instructions    Call MD / Call 911    Complete by:  As directed    If you experience chest pain or shortness of breath, CALL 911 and be transported to the hospital emergency room.  If you develope a fever above 101 F, pus (white drainage) or increased drainage or redness at the wound, or calf pain, call your surgeon's office.   Change dressing    Complete by:  As directed    Maintain surgical dressing until follow up in the clinic. If the edges start to pull up, may reinforce with tape. If the dressing is no longer working, may remove and cover with gauze and tape, but must keep the area dry and clean.  Call with any questions or concerns.   Constipation Prevention    Complete by:  As directed    Drink plenty of fluids.  Prune juice may be helpful.  You may use a stool softener, such as Colace (over the counter) 100 mg twice a day.  Use MiraLax (over the counter) for constipation as needed.   Diet - low sodium heart healthy    Complete by:  As directed    Discharge instructions    Complete by:  As directed    Maintain surgical dressing until follow up in the clinic. If the edges start to pull up, may reinforce with tape. If the dressing is no longer working, may remove and cover with gauze and tape, but must  keep the area dry and clean.  Follow up in 2 weeks at [facility]. Call with any questions or concerns.   Increase activity slowly as tolerated    Complete by:  As directed    Weight bearing as tolerated with assist device (walker, cane, etc) as directed, use it as long as suggested by your surgeon or therapist, typically at least 4-6 weeks.   TED hose    Complete by:  As directed    Use stockings (TED hose) for 2 weeks on both leg(s).  You may remove them at night for sleeping.        Medication List    STOP taking these  medications   aspirin EC 81 MG tablet Replaced by:  aspirin 81 MG chewable tablet   nabumetone 500 MG tablet Commonly known as:  RELAFEN   traMADol 50 MG tablet Commonly known as:  ULTRAM     TAKE these medications   anastrozole 1 MG tablet Commonly known as:  ARIMIDEX Take 1 mg by mouth every other day.   ANDROGEL PUMP 20.25 MG/ACT (1.62%) Gel Generic drug:  Testosterone Apply 2 Act topically daily. On each shoulder   aspirin 81 MG chewable tablet Chew 1 tablet (81 mg total) by mouth 2 (two) times daily. Take for 4 weeks, then resume regular dose. Replaces:  aspirin EC 81 MG tablet   atorvastatin 20 MG tablet Commonly known as:  LIPITOR Take 1 tablet (20 mg total) by mouth daily at 6 PM.   b complex vitamins tablet Take 1 tablet by mouth daily.   carvedilol 12.5 MG tablet Commonly known as:  COREG Take 1 tablet (12.5 mg total) by mouth 2 (two) times daily with a meal.   Co Q 10 100 MG Caps Take 1 capsule by mouth daily.   docusate sodium 100 MG capsule Commonly known as:  COLACE Take 1 capsule (100 mg total) by mouth 2 (two) times daily.   ferrous sulfate 325 (65 FE) MG tablet Take 1 tablet (325 mg total) by mouth 3 (three) times daily after meals.   fish oil-omega-3 fatty acids 1000 MG capsule Take 1 g by mouth daily.   folic acid A999333 MCG tablet Commonly known as:  FOLVITE Take 400 mcg by mouth daily.     Glucosamine-Chondroitin 1500-1200 MG/30ML Liqd Take 1 tablet by mouth daily.   HYDROcodone-acetaminophen 7.5-325 MG tablet Commonly known as:  NORCO Take 1-2 tablets by mouth every 4 (four) hours as needed for moderate pain.   hydroxypropyl methylcellulose / hypromellose 2.5 % ophthalmic solution Commonly known as:  ISOPTO TEARS / GONIOVISC Place 1 drop into both eyes daily as needed for dry eyes.   losartan 100 MG tablet Commonly known as:  COZAAR Take 1 tablet (100 mg total) by mouth daily.   MAGNESIUM PO Take 1 tablet by mouth every other day.   methocarbamol 500 MG tablet Commonly known as:  ROBAXIN Take 1 tablet (500 mg total) by mouth every 6 (six) hours as needed for muscle spasms.   multivitamin capsule Take 1 capsule by mouth daily.   multivitamin-lutein Caps capsule Take 1 capsule by mouth daily.   NON FORMULARY CPAP machine   pantoprazole 40 MG tablet Commonly known as:  PROTONIX TAKE 1 TABLET BY MOUTH EVERY DAY BEFORE BREAKFAST   polyethylene glycol packet Commonly known as:  MIRALAX / GLYCOLAX Take 17 g by mouth 2 (two) times daily.   POTASSIUM PO Take 1 tablet by mouth every other day.   PROBIOTIC DAILY PO Take 1 capsule by mouth daily.   tamsulosin 0.4 MG Caps capsule Commonly known as:  FLOMAX Take 1 capsule (0.4 mg total) by mouth daily. What changed:  when to take this   TURMERIC PO Take 1 tablet by mouth daily.        Signed: [place] Pugh. Layni Kreamer   PA-C  03/24/2016, 10:01 PM

## 2016-03-25 DIAGNOSIS — M1711 Unilateral primary osteoarthritis, right knee: Secondary | ICD-10-CM | POA: Diagnosis not present

## 2016-03-26 ENCOUNTER — Telehealth: Payer: Self-pay | Admitting: Pulmonary Disease

## 2016-03-26 NOTE — Telephone Encounter (Signed)
Spoke with pt who states he had a total knee replacement 2 week ago. Pt states since surgery he hasn't really felt good. Pt states 5 days ago he started checking his tempeture, temp has been around 99 to 100, non prod cough, weakness & some chest discomfort (feels like bee stings) Pt requested an appt with SN for Thursday, however SN schedule was full. I have scheduled pt with SG 03-27-16 @ 4:00. Nothing further needed.

## 2016-03-27 ENCOUNTER — Encounter: Payer: Self-pay | Admitting: Acute Care

## 2016-03-27 ENCOUNTER — Ambulatory Visit (INDEPENDENT_AMBULATORY_CARE_PROVIDER_SITE_OTHER)
Admission: RE | Admit: 2016-03-27 | Discharge: 2016-03-27 | Disposition: A | Discharge status: Home / Self Care | Payer: Medicare Other | Source: Ambulatory Visit | Attending: Acute Care | Admitting: Acute Care

## 2016-03-27 ENCOUNTER — Ambulatory Visit (INDEPENDENT_AMBULATORY_CARE_PROVIDER_SITE_OTHER): Payer: Medicare Other | Admitting: Acute Care

## 2016-03-27 VITALS — BP 124/70 | HR 101 | Temp 98.5°F | Ht 67.0 in | Wt 183.2 lb

## 2016-03-27 DIAGNOSIS — M1711 Unilateral primary osteoarthritis, right knee: Secondary | ICD-10-CM | POA: Diagnosis not present

## 2016-03-27 DIAGNOSIS — M7989 Other specified soft tissue disorders: Secondary | ICD-10-CM

## 2016-03-27 DIAGNOSIS — R059 Cough, unspecified: Secondary | ICD-10-CM

## 2016-03-27 DIAGNOSIS — R0602 Shortness of breath: Secondary | ICD-10-CM | POA: Diagnosis not present

## 2016-03-27 DIAGNOSIS — I2510 Atherosclerotic heart disease of native coronary artery without angina pectoris: Secondary | ICD-10-CM

## 2016-03-27 DIAGNOSIS — R05 Cough: Secondary | ICD-10-CM | POA: Diagnosis not present

## 2016-03-27 DIAGNOSIS — Z96651 Presence of right artificial knee joint: Secondary | ICD-10-CM | POA: Diagnosis not present

## 2016-03-27 DIAGNOSIS — Z471 Aftercare following joint replacement surgery: Secondary | ICD-10-CM | POA: Diagnosis not present

## 2016-03-27 DIAGNOSIS — R0600 Dyspnea, unspecified: Secondary | ICD-10-CM | POA: Diagnosis not present

## 2016-03-27 NOTE — Patient Instructions (Addendum)
It is good to meet you today. We will schedule you for venous doppler studies. We will call you with results. We will prescribe a z-pack.today. Follow up with Dr. Lenna Gilford in 2 weeks. Please contact office for sooner follow up if symptoms do not improve or worsen or seek emergency care

## 2016-03-27 NOTE — Assessment & Plan Note (Signed)
Dyspnea and cough since knee replacement surgery on 03/12/2016 Plan We will schedule you for venous doppler studies today. We will call you with results. We will prescribe a z-pack.today. Follow up with Dr. Lenna Gilford in 2 weeks. Please contact office for sooner follow up if symptoms do not improve or worsen or seek emergency care

## 2016-03-27 NOTE — Progress Notes (Signed)
History of Present Illness [name] is a 70 y.o. male with OSA on CPAP, history of hemoptysis, HTN and CAD S/P stent 2005. He has multiple other medical problems. He is followed by Dr. Lenna Gilford.   03/27/2016 Acute OV: Pt. Presents today for fever ( low grade right at 100.0), cough and weakness x  2 weeks. The cough is nonproductive. He had knee replacement surgery 03/12/2016 by Dr. Alvan Dame.He states that he has felt bad since surgery. He was seen by Dr. Alvan Dame today. He is taking hydrocodone and a muscle relaxant right knee for pain.  He was started on Celebrex today.He does complain of some tingling around his left nipple, but no chest pain. No worsening with deep inspiration. He does not complain of any calf or leg pain, there is no edema noted in either leg. No recent automobile or airline travel. He has been less mobile due to the fact he is nonweightbearing on that right leg. He is walking with a walker. He states he did take daily aspirin for 2 days postoperatively per Dr. Aurea Graff orders. Chest X ray today showed no superimposed acute process. Right knee incision is healing without drainage or firmness to touch. The site does remain slightly warm.  As he has recently been intubated, and states he has not felt well since. In light of the fact he has a low-grade fever we will treat with a Z-Pak to ensure this is not an upper respiratory infection secondary to intubation.      Tests CXR 03/27/2016 IMPRESSION: Stable exam.  No superimposed acute process.  Past medical hx Past Medical History:  Diagnosis Date  . Allergic rhinitis   . Anginal pain ([place]) 05/26/14   chest pain after chasing dog  . CAD (coronary artery disease)    hx of stent- 2005 RCA  . CHF (congestive heart failure) ([place])   . Chronic back pain    intermittent  . Constipation   . Dizziness   . Dysrhythmia    right bundle branch block   . Esophageal stricture   . GERD (gastroesophageal reflux disease)   . Hemoptysis     . Hiatal hernia   . History of colonic polyps    hyperplastic  . HTN (hypertension)   . Hyperlipidemia   . Hypertrophy of prostate with urinary obstruction and other lower urinary tract symptoms (LUTS)   . OA (osteoarthritis)   . OSA (obstructive sleep apnea)    cpap- 10   . 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 Pneumonia   . Shortness of breath    with exertion  . Testicular hypofunction      Past surgical hx, Family hx, Social hx all reviewed.  Current Outpatient Prescriptions on File Prior to Visit  Medication Sig  . ANDROGEL PUMP 20.25 MG/ACT (1.62%) GEL Apply 2 Act topically daily. On each shoulder  . aspirin 81 MG chewable tablet Chew 1 tablet (81 mg total) by mouth 2 (two) times daily. Take for 4 weeks, then resume regular dose.  Marland Kitchen atorvastatin (LIPITOR) 20 MG tablet Take 1 tablet (20 mg total) by mouth daily at 6 PM.  . b complex vitamins tablet Take 1 tablet by mouth daily.   . carvedilol (COREG) 12.5 MG tablet Take 1 tablet (12.5 mg total) by mouth 2 (two) times daily with a meal.  . Coenzyme Q10 (CO Q 10) 100 MG CAPS Take 1 capsule  by mouth daily.   Marland Kitchen docusate sodium (COLACE) 100 MG capsule Take 1 capsule (100 mg total) by mouth 2 (two) times daily.  . ferrous sulfate 325 (65 FE) MG tablet Take 1 tablet (325 mg total) by mouth 3 (three) times daily after meals.  . fish oil-omega-3 fatty acids 1000 MG capsule Take 1 g by mouth daily.   . folic acid (FOLVITE) A999333 MCG tablet Take 400 mcg by mouth daily.   . Glucosamine-Chondroitin 1500-1200 MG/30ML LIQD Take 1 tablet by mouth daily.   Marland Kitchen HYDROcodone-acetaminophen (NORCO) 7.5-325 MG tablet Take 1-2 tablets by mouth every 4 (four) hours as needed for moderate pain.  . hydroxypropyl methylcellulose / hypromellose (ISOPTO TEARS / GONIOVISC) 2.5 % ophthalmic solution Place 1 drop into both eyes daily as needed for dry eyes.  Marland Kitchen losartan  (COZAAR) 100 MG tablet Take 1 tablet (100 mg total) by mouth daily.  Marland Kitchen MAGNESIUM PO Take 1 tablet by mouth every other day.   . methocarbamol (ROBAXIN) 500 MG tablet Take 1 tablet (500 mg total) by mouth every 6 (six) hours as needed for muscle spasms.  . Multiple Vitamin (MULTIVITAMIN) capsule Take 1 capsule by mouth daily.    . multivitamin-lutein (OCUVITE-LUTEIN) CAPS capsule Take 1 capsule by mouth daily.  . NON FORMULARY CPAP machine  . pantoprazole (PROTONIX) 40 MG tablet TAKE 1 TABLET BY MOUTH EVERY DAY BEFORE BREAKFAST  . polyethylene glycol (MIRALAX / GLYCOLAX) packet Take 17 g by mouth 2 (two) times daily.  Marland Kitchen POTASSIUM PO Take 1 tablet by mouth every other day.   . Probiotic Product (PROBIOTIC DAILY PO) Take 1 capsule by mouth daily.   . tamsulosin (FLOMAX) 0.4 MG CAPS capsule Take 1 capsule (0.4 mg total) by mouth daily. (Patient taking differently: Take 0.4 mg by mouth at bedtime. )  . TURMERIC PO Take 1 tablet by mouth daily.   Marland Kitchen anastrozole (ARIMIDEX) 1 MG tablet Take 1 mg by mouth every other day.  . nabumetone (RELAFEN) 500 MG tablet Take 1 tablet (500 mg total) by mouth 2 (two) times daily. (Patient not taking: Reported on 03/27/2016)  . traMADol (ULTRAM) 50 MG tablet Take 1 tablet (50 mg total) by mouth 3 (three) times daily as needed. (Patient not taking: Reported on 03/27/2016)   No current facility-administered medications on file prior to visit.      Allergies  Allergen Reactions  . Sulfamethoxazole-Trimethoprim Rash       . Sulfonamide Derivatives Rash         Review Of Systems:  Constitutional:   No  weight loss, night sweats,  +Fevers, chills,+ fatigue, or  lassitude.  HEENT:   No headaches,  Difficulty swallowing,  Tooth/dental problems, or  +Sore throat,                No sneezing, itching, ear ache, nasal congestion,+ post nasal drip,   CV:  No chest pain,  Orthopnea, PND, swelling in lower extremities, anasarca, dizziness, palpitations, syncope.   GI  No  heartburn, indigestion, abdominal pain, nausea, vomiting, diarrhea, change in bowel habits, loss of appetite, bloody stools.   Resp: + shortness of breath with exertion not at rest.  No excess mucus, no productive cough,  + non-productive cough,  No coughing up of blood.  No change in color of mucus.  No wheezing.  No chest wall deformity  Skin: no rash or lesions.  GU: no dysuria, change in color of urine, no urgency or frequency.  No flank pain,  no hematuria   MS:  + joint pain ( Right knee) or swelling.  + decreased range of motion.  No back pain.  Psych:  No change in mood or affect. No depression or anxiety.  No memory loss.   Vital Signs BP 124/70 (BP Location: Right Arm, Patient Position: Sitting, Cuff Size: Normal)   Pulse (!) 101   Temp 98.5 F (36.9 C) (Oral)   Ht 5\' 7"  (1.702 m)   Wt 183 lb 3.2 oz (83.1 kg)   SpO2 96%   BMI 28.69 kg/m    Physical Exam:  General- No distress,  A&Ox3 ,pleasant, ambulating with a walker ENT: No sinus tenderness, TM clear, pale nasal mucosa, no oral exudate,no post nasal drip, no LAN Cardiac: S1, S2, regular rate and rhythm, no murmur Chest: No wheeze/ rales/ dullness; no accessory muscle use, no nasal flaring, no sternal retractions Abd.: Soft Non-tender Ext: No clubbing cyanosis, Slight right knee swelling , trace edema Neuro:  normal strength Skin: No rashes, warm and dry Psych: normal mood and behavior   Assessment/Plan  Dyspnea Dyspnea and cough since knee replacement surgery on 03/12/2016 Plan We will schedule you for venous doppler studies today. We will call you with results. We will prescribe a z-pack.today. Follow up with Dr. Lenna Gilford in 2 weeks. Please contact office for sooner follow up if symptoms do not improve or worsen or seek emergency care    ? URI Cough with low grade fever Plan: CXR ZPack  Magdalen Spatz, NP 03/27/2016  5:46 PM

## 2016-03-28 ENCOUNTER — Telehealth: Payer: Self-pay | Admitting: Acute Care

## 2016-03-28 ENCOUNTER — Ambulatory Visit (HOSPITAL_COMMUNITY)
Admission: RE | Admit: 2016-03-28 | Discharge: 2016-03-28 | Disposition: A | Discharge status: Home / Self Care | Payer: Medicare Other | Source: Ambulatory Visit | Attending: Cardiology | Admitting: Cardiology

## 2016-03-28 ENCOUNTER — Encounter (HOSPITAL_COMMUNITY): Payer: Self-pay

## 2016-03-28 ENCOUNTER — Other Ambulatory Visit: Payer: Self-pay | Admitting: Cardiovascular Disease

## 2016-03-28 DIAGNOSIS — M7989 Other specified soft tissue disorders: Secondary | ICD-10-CM | POA: Insufficient documentation

## 2016-03-28 DIAGNOSIS — I2510 Atherosclerotic heart disease of native coronary artery without angina pectoris: Secondary | ICD-10-CM | POA: Diagnosis not present

## 2016-03-28 DIAGNOSIS — I10 Essential (primary) hypertension: Secondary | ICD-10-CM | POA: Insufficient documentation

## 2016-03-28 DIAGNOSIS — M25861 Other specified joint disorders, right knee: Secondary | ICD-10-CM | POA: Insufficient documentation

## 2016-03-28 DIAGNOSIS — Z87891 Personal history of nicotine dependence: Secondary | ICD-10-CM | POA: Insufficient documentation

## 2016-03-28 DIAGNOSIS — M79604 Pain in right leg: Secondary | ICD-10-CM | POA: Diagnosis not present

## 2016-03-28 DIAGNOSIS — E785 Hyperlipidemia, unspecified: Secondary | ICD-10-CM | POA: Insufficient documentation

## 2016-03-28 MED ORDER — AZITHROMYCIN 250 MG PO TABS: ORAL_TABLET | ORAL | 0 refills | Status: AC

## 2016-03-28 NOTE — Progress Notes (Signed)
Preliminary results for right lower extremity venous duplex given to Dr. Elie Confer.

## 2016-03-28 NOTE — Telephone Encounter (Signed)
Pt calling again a/b rx, please advise please let pt know when this has been done.[name]

## 2016-03-28 NOTE — Progress Notes (Signed)
Right lower extremity venous duplex was negative for DVT. 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

## 2016-03-28 NOTE — Telephone Encounter (Signed)
Spoke with pt. He was supposed have a Zpack sent to the pharmacy yesterday when he saw SG. This was not done. Rx has been sent in. I apologized to the pt for this error. Nothing further was needed.

## 2016-03-29 DIAGNOSIS — M1711 Unilateral primary osteoarthritis, right knee: Secondary | ICD-10-CM | POA: Diagnosis not present

## 2016-04-01 ENCOUNTER — Telehealth: Payer: Self-pay | Admitting: Acute Care

## 2016-04-01 DIAGNOSIS — M1711 Unilateral primary osteoarthritis, right knee: Secondary | ICD-10-CM | POA: Diagnosis not present

## 2016-04-01 NOTE — Telephone Encounter (Signed)
I left a message on [name] voicemail indicating that the DVT study that he had done on 03/28/2016 indicated no deep vein thrombosis. I did let him know that there was an avascular mass in the popliteal space, that could potentially be a Baker cyst. I have encouraged him to follow up for ultrasound with his orthopedic surgeon who recently did a knee replacement. I left the office contact information and phone number and encouraged him to call if he had any further questions regarding the results of this exam.

## 2016-04-03 DIAGNOSIS — M1711 Unilateral primary osteoarthritis, right knee: Secondary | ICD-10-CM | POA: Diagnosis not present

## 2016-04-05 DIAGNOSIS — M1711 Unilateral primary osteoarthritis, right knee: Secondary | ICD-10-CM | POA: Diagnosis not present

## 2016-04-05 DIAGNOSIS — E291 Testicular hypofunction: Secondary | ICD-10-CM | POA: Diagnosis not present

## 2016-04-08 DIAGNOSIS — M1711 Unilateral primary osteoarthritis, right knee: Secondary | ICD-10-CM | POA: Diagnosis not present

## 2016-04-09 DIAGNOSIS — E291 Testicular hypofunction: Secondary | ICD-10-CM | POA: Diagnosis not present

## 2016-04-10 DIAGNOSIS — M1711 Unilateral primary osteoarthritis, right knee: Secondary | ICD-10-CM | POA: Diagnosis not present

## 2016-04-15 ENCOUNTER — Encounter: Payer: Self-pay | Admitting: Pulmonary Disease

## 2016-04-15 ENCOUNTER — Ambulatory Visit (INDEPENDENT_AMBULATORY_CARE_PROVIDER_SITE_OTHER): Payer: Medicare Other | Admitting: Pulmonary Disease

## 2016-04-15 VITALS — BP 110/72 | HR 94 | Temp 96.8°F | Ht 67.0 in | Wt 182.0 lb

## 2016-04-15 DIAGNOSIS — G4733 Obstructive sleep apnea (adult) (pediatric): Secondary | ICD-10-CM | POA: Diagnosis not present

## 2016-04-15 DIAGNOSIS — I2510 Atherosclerotic heart disease of native coronary artery without angina pectoris: Secondary | ICD-10-CM

## 2016-04-15 DIAGNOSIS — N401 Enlarged prostate with lower urinary tract symptoms: Secondary | ICD-10-CM

## 2016-04-15 DIAGNOSIS — Z96651 Presence of right artificial knee joint: Secondary | ICD-10-CM

## 2016-04-15 DIAGNOSIS — M1711 Unilateral primary osteoarthritis, right knee: Secondary | ICD-10-CM | POA: Diagnosis not present

## 2016-04-15 DIAGNOSIS — Z23 Encounter for immunization: Secondary | ICD-10-CM | POA: Diagnosis not present

## 2016-04-15 DIAGNOSIS — I10 Essential (primary) hypertension: Secondary | ICD-10-CM

## 2016-04-15 DIAGNOSIS — N138 Other obstructive and reflux uropathy: Secondary | ICD-10-CM

## 2016-04-15 DIAGNOSIS — M159 Polyosteoarthritis, unspecified: Secondary | ICD-10-CM

## 2016-04-15 DIAGNOSIS — E291 Testicular hypofunction: Secondary | ICD-10-CM

## 2016-04-15 DIAGNOSIS — M150 Primary generalized (osteo)arthritis: Secondary | ICD-10-CM

## 2016-04-15 NOTE — Patient Instructions (Signed)
Today we updated your med list in our EPIC system...    Continue your current medications the same...  Today we gave you the 2017 Flu vaccine...  Keep up the good work w/ your knee rehabilitation...  Call for any questions...  Let's plan a follow up visit in 69mo, sooner if needed for problems.Marland KitchenMarland Kitchen

## 2016-04-15 NOTE — Progress Notes (Signed)
Subjective:    Patient ID: [name], male    DOB: Apr 27, 1946, 70 y.o.   MRN: [id number]  HPI 70 y/o WM here for a follow up visit... he has multiple medical problems as noted below...   ~  SEE PREV EPIC NOTES FOR OLDER DATA >>   ~  June 18, 2010:  He's had a good 63mo> still c/o bilat CTS symtpms in the AM & he will try the wrist splints Qhs...  he was started on AXIRON topical testosterone Rx per DrTannenbaum & improved...  ~  January 07, 2011:  He continues to f/u w/ DrCooper for Cards every 50mo- seen last wk> CAD, s/p stent RCA 2005, LBBB; last cath was 2009 w/ patent RCA stent & mild non-obstructive dis elsewhere; doing well, exercising regularly, no CP/ palpit/ SOB/ edema/ etc; rec to continue ASA/ Plavix,   LABS 2/13:  FLP- elevTG & lowHDL on Cres10;  Chems- wnl;  CBC- wnl;  TSH=1.91;  PSA=2.08;  VitD=58;  UA- clear  LABS 7/13 by DrDeveshwar:  Chems- wnl x BS=109;  CBC- wnl  ~  November 29, 2014:  3 year ROV & [name] returns after a long hiatus, expertly cared for in the interval by DrCooper for Cards, DrCram for NS, DrDeveshwar for Rheum, DrTannenbaum for Urology, and DrCornett for CCS... He tells me that he needs 2 knees, had lumbar lam by DrCram about 62mo ago, and he wants to see ENT due to losing his voice- voice getting weaker assoc w/ dry cough, denies sput, c/o mild SOB "it's my heart"    OSA> stable on CPAP w/ supplies Q63mo from [place]; no issues w/ his machine or interface; rests well, wakes refreshed, no daytime hypersomnolence & no prob w/ alertness...    HBP & CAD, s/p stent in RCA 2005, LBBB, decr EF w/ septal dysynergy, chr dyspnea> followed by DrCooper on ASA/ Plavix/ Coreg12.5Bid/ Cozaar100; BP= 142/72 & he denies CP, palpit, dizzy, SOB, edema; last saw DrCooper 07/2014 & his note is reviewed    CHOL> on Lip20, FishOil, CoQ10; FLP 9/15 showed TChol 125, TG 154, HDL 30, LDL 64; needs better low fat diet...    GI> HH, Hx stricture, antral nodule=adenoma> on Protonix40 followed  by Ardis Hughs (see below); colonoscopy 1/16 was neg/ wnl...    GU> BPH, LTOS, Low-T> on Flomax0.4 & Androgel per DrTannenbaum; he relates a story about GU symptoms & extensive work up in their office "doing just fine" he says, incr energy, etc; treated for seminal vesiculitis w/ Doxy in the past; last seen 3/16 and note reviewed...     Ortho> DJD, LBP> prev on Celebrex & switched to Etodolac & now Percocet & Tramadol; he notes that he's been hurting since he was 70 y/o w/ a strong fam hx arthritis; he saw DrDeveshwar for Rheum & DrCram for NS- s/p Lumbar decompression for resection of synovial cyst & large bone spur L5-S1    Derm> Hx skin cancers treated by DrDJones... EXAM showed Afeb, VSS, O2sat=96% on RA;  HEENT- neg, mallampati1;  Chest- clear w/o w/r/r;  Heart- RR gr1/6 SEM S4 w/o rubs;  Abd- soft, nontender, neg;  Ext- neg w/o c/c/e;  Neuro- intact... We reviewed prob list, meds, xrays and labs> see below for updates >> given TDAP & Prevnar-13, he reports that he got the Pneumovax23 at health dept in 2009...  2DEcho 07/22/11 showed mild LVH, & decr LVF w/ EF~30% w/ HK, Gr1 DD, mild MR, mild LAdil, ..   CXR 08/13/12  showed norm heart size, clear lungs, NAD.Marland KitchenMarland Kitchen   EKG 05/24/14 showed NSR, rate76, LBBB, no acute changes...  CATH [id number] showed patent coronaries and open stent in RCA, nonobstructive dis in LAD, mild to mod LVD (EF=50%) w/ norm LVEDP- medical management.  Spirometry 11/29/14 showed FVC=2.84 (67%), FEV1=2.05 (63%), %1sec=72, mid-flows reduced at 46% predicted; c/w mild airflow obstruction but can't r/o superimposed restriction w/o lung volumes, etc...   LABS 5/16 showed Chems- wnl & CBC- wnl IMP/PLAN>>  A lot to get caught up on after a 3 yr hiatus;  meds refilled, he wants ZPak for prn use, TDAP & Prevnar-13 today, refer to ENT for check due to losing his voice...  ~  December 06, 2015:  Yearly [place] f/u visit>  Ran reports an number of things that have occurred over the past yr:  1)  root canal done yest, on ?Clinda300tid (didn't bring meds or list to todays visit) & reminded to take Probiotic daily + Activia yogurt;  2) has "stress fx" R knee, eval by DrOlin- wearing brace & has cane;  3) Back surg 5/16 by DrCram w/ "cyst" removed;  4) he says XRays and MRIs have shown that he is "eaten up w/ arthritis" and there is nothing else that they can do; he sees Rheum- DrDeveshwar & she will check him before he travels & give him a shot... 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    HBP & CAD, s/p stent in RCA 2005, LBBB, decr EF 35-40% w/ septal dysynergy, chr dyspnea> followed by DrCooper off Plavix, on ASA/ Coreg12.5Bid/ Cozaar100; BP= 128/72 & he denies CP, palpit, dizzy, SOB, edema; last saw DrCooper 6/17 & his note is reviewed- HBP, CAD, s/p PCI to RCA, LBBB, no recurrent ischemia (cath 1/16 w/ patent stent) & he has chr dyspnea; lim in exerc by his knee prob; he worries a lot about statin related side effects.    CHOL> on Lip20, FishOil, CoQ10; FLP 10/16 showed TChol 121, TG 160, HDL 32, LDL 58; needs better low fat diet...    GI> HH, Hx stricture, antral nodule=adenoma> on Protonix40 followed by DrJacobs (see below); EGD 1/16 showed 3cmHH & schatzki's ring, duodenal adenoma resected & a subepithelial antral lesion; colonoscopy 1/16 was neg/ wnl...    GU> BPH, LTOS, Low-T> on Flomax0.4 & Androgel per DrTannenbaum; he relates a story about GU symptoms & extensive work up in their office "doing just fine" he says, incr energy, etc; treated for seminal vesiculitis w/ Doxy in the past; last seen 6/17 and note  reviewed- LTOS & Low-T, on Flomax & Androgel...    Ortho> DJD, LBP> prev on Celebrex & Percocet, switched to Relafen & Tramadol; he notes that he's been hurting since he was 70 y/o w/ a strong fam hx arthritis; he saw DrDeveshwar for Rheum & DrCram for NS- s/p Lumbar decompression for resection of synovial cyst & large bone spur L5-S1    Derm> Hx skin cancers treated by DrDJones... EXAM showed Afeb, VSS, O2sat=96% on RA;  HEENT- neg, mallampati1;  Chest- clear w/o w/r/r;  Heart- RR  gr1/6 SEM S4 w/o rubs;  Abd- soft, nontender, neg;  Ext- neg w/o c/c/e;  Neuro- intact...  CXR 12/06/15> heart at upper lim of norm, lungs essentially clear w/ some scarring on left, NAD...  FASTING blood work> he never returned for FASTING labs as requested... IMP/PLAN>>  Hakeen appears stable w/ his medical care distributed among his specialists- DrCooper, DrJacobs, DrTannenbaum, DrDeveshwar, DrCram; continue present meds...   ~  April 15, 2016:  71mo [place] saw SG 3wks ago w/ nonprod cough, low grade temp~100, weakness; he had R-TKR 03/12/16 by DrOlin & post op rehab has been rough- on musc relaxer & Norco, Celebrex added & using a walker;  Exam was neg- min leg swelling, chest clear, VSS;  He was given a ZPak & checked VenDopplers- neg, no DVT;  He is here for f/u today- still in lots of pain from the knee surg but getting better slowly he says & the ZPak really helped overall;  He notes that he may need his left knee done later! EXAM showed Afeb, VSS, O2sat=97% on RA;  HEENT- neg, mallampati1;  Chest- clear w/o w/r/r;  Heart- RR gr1/6 SEM S4 w/o rubs;  Abd- soft, nontender, neg;  Ext- s/p R-TKR, mild edema, no c/c;  Neuro- intact...  CXR 03/27/16 (independently reviewed by me in the PACS system)>  Borderline heart size w/o edema, mild basilar atx vs scarring, NAD.Marland KitchenMarland Kitchen   Ven Dopplers 03/28/16>  RLE neg for DVT, pos for baker's cyst...  LABS 02/2016 in Epic>  Chems- wnl x BS=130 range;  CBC- mild anemia post op w/  Hg=11.2, mcv=90;  Blood type= Opos IMP/PLAN>>  Corben is slowly improving- URI resolved, getting better w/ exercise/ actiity/ to start PT etc;  OK 2017 Flu vaccine today & we'll plan ROV ~33mo...           PROBLEM LIST:  ENT >> some type of ENT/ Oral surg 7/12> referred by his dentist to [facility] w/ surg thru gums to remove a growth in his sinus= ?dermoid...  OBSTRUCTIVE SLEEP APNEA - Hx OSA w/ sleep study 11/02 RDI=18 and desat to 85%; seen by DrClance and on CPAP rx;  he's been using it regularly, denies daytime hypersomnolence, etc;  He gets machine check & new mask Q61mo thru [employer] & he reports download showed 12,000 hrs use! ~  CXR 3/10 clear w/ min left basilar scarring... ~  Pt gets his CPAP machine checked & masks replaced every 23mo thru [place]... ~  CXR 10/12 showed normal heart size, clear lungs x left base atx, mild degen changes in TSpine...  Episode of [place] 4/10 after episode of BRB hemoptysis> full eval was neg, no recurrence... he had bronchoscopy- neg,  ENT eval DrRosen- neg, noted some persistant cough treated w/ Mucinex DM, Zyrtek, Tussionex...  no recurrence. ~  NOTE> he is an exsmoker: started at ~11, smoked up til ~41, for a 30 yr smoking hx & he quit >30 yrs ago...  HBP & CAD-S/P stent in RCA on 2005 - followed regularly by DrCooper on ASA 81mg /d, off PLAVIX,  COREG 12.5mg Bid,  LOSARTAN 100mg /d...  BP= 120/72 today & similar at home> denies HA, fatigue, visual changes, CP, palipit, dizziness, syncope, dyspnea, edema, etc...  ~  EKG w/ baseline LBBB;  ~  Adenosine MRI @ [facility] 9/07= no ischemia & EF=50-55;  ~  last cath 3/06 w/ non-obstructive 3 vessel dz (20% lesions in all 3) & mild global HK w/ EF=45-50%;   ~  last cardiolite 2/06 without ischemia or infarction, EF=44% w/ abn septal motion; ~  2DEcho 7/09 showed septal dyssynergy related to the IVCD, EF= 45-50 %, mild focal basal septal hypertrophy, doppler parameters were consistent with abnormal left ventricular  relaxation, mild MR... ~  2/13: he had f/u DrCooper> still c/o fatigue & dyspnea; no angina, BP controlled, BNP=25, EKG showed NSR, rate72, LBBB;  2DEcho & cardiac MRI are planned... ~  2DEcho 3/13 showed decr EF=30% w/ HK, mild LVH, Gr1DD, mild MR ~  3/13:  Cardiac MRI showed mild LVenlargement, mild LAE, no scar or infarct, no ischemia, no regional wall motion abn, but there was abn septal motion & EF=48%  HYPERLIPIDEMIA - he follows a low fat diet and takes LIPITOR 20mg /d, off NIACIN, Fish Oil, Flax Seed Oil, & CoQ10... ~  [place] 9/08 showed TChol 110, TG 82, HDL 28, LDL 66 ~  FLP 8/09 showed TChol 177, TG 130, HDL 36, LDL 115 ~  FLP 1/10 showed TChol 114, TG 109, HDL 31, LDL 62 ~  FLP 2/11 showed TChol 103, TG 85, HDL 36, LDL 51 ~  FLP 2/12 showed TChol 101, TG 121, HDL 26, LDL 51 ~  FLP 2/13 on Cres10+Niacin500 showed TChol 124, TG 167, HDL 33, LDL 57  HIATAL HERNIA, Hx of ESOPHAGEAL STRICTURE, OTHER SPECIFIED DISORDER OF STOMACH AND DUODENUM (ICD-537.89) >> he gets the [facility], along w/ the similar product from [employer]- "I read too much"- he stopped PPI/ Dexilant Rx in favor of "Vinegar & Honey- it works as well per the [employer]"...  ~  he had an EGD 6/08 showing a 3 cm HH & distal esoph stricture dilated;  Rx Pepcid 20mg /d... ~  EGD 09/02/08 by DrPerry showed a HH, schatzki ring, 1cm submucosal nodule in antrum, 2cm sessile polyp in duod. 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 ~  f/u DrPatterson 6/10- continue KAPIDEX & reflux regimen... ~  1/12:  he is overdue for f/u w/ GI & we will request appt. ~  11/12: he saw DrJacobs for f/u eval & had EGD12/12 showing Schatzki's ring, sessile duod polyp (Bx=tubular adenoma), unchanged submucosal nodule... ~  8/13:  He lists PROTONIX 40mg /d on his current med list... ~   1/16:  EGD by DrJacobs showed a schatzki's ring above a 3cm HH, antral subepith lesion unchanged from 3 yrs ago, 27mm polypoid mucosa in duodenum (path= duod adenoma)- they plan repeat in 3 yrs...  Hx of Hyperplastic COLONIC POLYP in 1999 - all followed by DrPatterson;  ~  his last colonoscopy was 8/04 and normal without recurrent polyps... ~  f/u colonoscopy 8/09 by DrPatterson showed 20mm polyp, bx= hyperplastic, f/u planned 77yrs. ~  1/16:  Colonoscopy by DrJacobs was wnl & f/u rec in 10 yrs.  Hx Elevated LFT's - routine CPX 12/08 w/ abn LFTs- this was felt to be secondary to his Statin therapy... he was told to stop the Statin and f/u labs in one month... he requested a liver evaluation from DrPatterson and he was seen 12/09 at which time DrPatterson rechecked his LFT's and they were all back to normal... he also checked the following: ~  AbdSonar w/ echodense liver, no lesions or GB problems... ~  Viral Hepatitis serologies were all negative, including HepC << 8/12: CDC came out w/ new rec that all Boomers should be checked for  HepC. ~  A1AT level was normal ~  Ceruloplasmin level was normal ~  ANA was neg, and Antimitochondrial AB/ Anti Smooth Muscle Ab were neg as well... ~  Serum Fe was normal & B12/Folate levels were normal as well... ~  2/12 NOTE:  LFTs have remained WNL & he is on Cres10.  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 ~  1/12: he reports Rx by DrTannenbaum w/ ANDROGEL 1.62% pump- use under each arm daily; Testos level 12/11= 575 on Rx.  OSTEOARTHRITIS, Hx of BACK PAIN - as noted he feels he must take the Celebrex daily as his pain (esp R knee) is much worse when he doesn't take it;  he sees DrOlin and had a RKnee arthroscopy  9/07> he uses Glucosamine, MVI, Vit D & Folic... ~  7/10:  he requests Rx for Celebrex 100mg  #90- take 1 cap daily as needed (advised just Prn use). ~  8/12:  Pt requests generic substitute for Celebrex (try RELAFEN 500mg ) & referral to DrDeveshwar for Rheumatology eval... ~  12/12:  Seen by DrDeveshwar for his OA, left shoulder pain w/ rotator cuff tear per DrCollins, tried shot; he has OA in hands and feet; rec Tramadol vs Relafen Rx.. ~  02/2016:  Judith had R-TKR by DrOlin, c/o lots of pain/ discomfort after surg & difficulty getting around....  Skin Cancers - he is followed by Dr Jarome Matin w/ [facility] Cream skin ca therapy- skin peel that attacks cancer cells.  [place] given age 70 (2011);  he get the yearly seasonal Flu vaccine every fall;  he had a shingles vaccine as well;  had tetanus shot 2005 w/ trip to [place];  up-to-date on colonoscopy done 2009; prostate checks by DrTannenbaum yearly...   Past Surgical History:  Procedure Laterality Date  . CARDIAC CATHETERIZATION     '05, last 2009, showing patent RCA stent  . COLONOSCOPY  12/2007   HYPERPLASTIC POLYP  . COLONOSCOPY WITH PROPOFOL N/A 06/02/2014   Procedure: COLONOSCOPY WITH PROPOFOL;  Surgeon: [name], MD;  Location: [facility] ENDOSCOPY;  Service: Endoscopy;  Laterality: N/A;  . CORONARY ANGIOPLASTY  08/2003  . ESOPHAGOGASTRODUODENOSCOPY (EGD) WITH PROPOFOL N/A 06/02/2014   Procedure: ESOPHAGOGASTRODUODENOSCOPY (EGD) WITH PROPOFOL;  Surgeon: [name], MD;  Location: [facility] ENDOSCOPY;  Service: Endoscopy;  Laterality: N/A;  . hernia surgery x 3     Bilateral Inguinal, Umbicial  . IRRIGATION AND DEBRIDEMENT ABSCESS Left 08/14/2012   Procedure: IRRIGATION AND DEBRIDEMENT LEFT INGUINAL BOIL ;  Surgeon: Ailene Rud, MD;  Location: [facility] ORS;  Service: Urology;  Laterality: Left;  . LEFT HEART CATHETERIZATION WITH CORONARY ANGIOGRAM N/A 05/26/2014   Procedure: LEFT HEART CATHETERIZATION WITH CORONARY  ANGIOGRAM;  Surgeon: Blane Ohara, MD;  Location[facility] CATH LAB;  Service: Cardiovascular;  Laterality: N/A;  . LUMBAR LAMINECTOMY/DECOMPRESSION MICRODISCECTOMY Left 09/26/2014   Procedure: Lumbar Laminectomy for resection of synovial cyst  Lumbar five- sacral one left;  Surgeon: Kary Kos, MD;  Location: [place] [facility] NEURO ORS;  Service: Neurosurgery;  Laterality: Left;  . Oral surg to removed growth from ?sinus  10/2010   ?Dermoid removed by DrRiggs  . RCA stenting     '05 RCA  . right toe surgery  Right  Cyst   . s/p right knee arthroscopy  2005  . TOTAL KNEE ARTHROPLASTY Right 03/12/2016   Procedure: RIGHT TOTAL KNEE ARTHROPLASTY;  Surgeon: Paralee Cancel, MD;  Location: [facility] ORS;  Service: Orthopedics;  Laterality: Right;  . UPPER GASTROINTESTINAL ENDOSCOPY      Outpatient Encounter Prescriptions as of 04/15/2016  Medication Sig Dispense Refill  . ANDROGEL PUMP 20.25 MG/ACT (1.62%) GEL Apply 2 Act topically daily. On each shoulder    . atorvastatin (LIPITOR) 20 MG tablet Take 1 tablet (20 mg total) by mouth daily at 6 PM. 90 tablet 3  . b complex vitamins tablet Take 1 tablet by mouth daily.     . carvedilol (COREG) 12.5 MG tablet Take 1 tablet (12.5 mg total) by mouth 2 (two) times daily with a meal. 180 tablet 3  . Coenzyme Q10 (CO Q 10) 100 MG CAPS Take 1 capsule by mouth daily.     Marland Kitchen docusate sodium (COLACE) 100 MG capsule Take 1 capsule (100 mg total) by mouth 2 (two) times daily. 10 capsule 0  . ferrous sulfate 325 (65 FE) MG tablet Take 1 tablet (325 mg total) by mouth 3 (three) times daily after meals.  3  . fish oil-omega-3 fatty acids 1000 MG capsule Take 1 g by mouth daily.     . folic acid (FOLVITE) A999333 MCG tablet Take 400 mcg by mouth daily.     . Glucosamine-Chondroitin 1500-1200 MG/30ML LIQD Take 1 tablet by mouth daily.     Marland Kitchen HYDROcodone-acetaminophen (NORCO) 7.5-325 MG tablet Take 1-2 tablets by mouth every 4 (four) hours as needed for moderate pain. 60 tablet 0  . hydroxypropyl  methylcellulose / hypromellose (ISOPTO TEARS / GONIOVISC) 2.5 % ophthalmic solution Place 1 drop into both eyes daily as needed for dry eyes.    Marland Kitchen losartan (COZAAR) 100 MG tablet Take 1 tablet (100 mg total) by mouth daily. 90 tablet 3  . MAGNESIUM PO Take 1 tablet by mouth every other day.     . methocarbamol (ROBAXIN) 500 MG tablet Take 1 tablet (500 mg total) by mouth every 6 (six) hours as needed for muscle spasms. 40 tablet 0  . Multiple Vitamin (MULTIVITAMIN) capsule Take 1 capsule by mouth daily.      . multivitamin-lutein (OCUVITE-LUTEIN) CAPS capsule Take 1 capsule by mouth daily.    . nabumetone (RELAFEN) 500 MG tablet Take 1 tablet (500 mg total) by mouth 2 (two) times daily. 180 tablet 0  . NON FORMULARY CPAP machine    . pantoprazole (PROTONIX) 40 MG tablet TAKE 1 TABLET BY MOUTH EVERY DAY BEFORE BREAKFAST 90 tablet 3  . polyethylene glycol (MIRALAX / GLYCOLAX) packet Take 17 g by mouth 2 (two) times daily. 14 each 0  . POTASSIUM PO Take 1 tablet by mouth every other day.     . Probiotic Product (PROBIOTIC DAILY PO) Take 1 capsule by mouth daily.     . tamsulosin (FLOMAX) 0.4 MG CAPS capsule Take 1 capsule (0.4 mg total) by mouth daily. (Patient taking differently: Take 0.4 mg by mouth at bedtime. ) 90 capsule 1  . traMADol (ULTRAM) 50 MG tablet Take 1 tablet (50 mg total) by mouth 3 (three) times daily as needed. 270 tablet 0  . TURMERIC PO Take 1 tablet by mouth daily.     . [DISCONTINUED] anastrozole (ARIMIDEX) 1 MG tablet Take 1 mg by mouth every other day.  3   No facility-administered encounter medications on file as of 04/15/2016.  Allergies  Allergen Reactions  . Sulfamethoxazole-Trimethoprim Rash       . Sulfonamide Derivatives Rash         Current Medications, Allergies, Past Medical History, Past Surgical History, Family History, and Social History were reviewed in [employer] record.     Review of Systems       See HPI - all other  systems neg except as noted...       The patient complains of dyspnea on exertion.  The patient denies anorexia, fever, weight loss, weight gain, vision loss, decreased hearing, hoarseness, chest pain, syncope, peripheral edema, prolonged cough, headaches, hemoptysis, abdominal pain, melena, hematochezia, severe indigestion/heartburn, hematuria, incontinence, muscle weakness, suspicious skin lesions, transient blindness, difficulty walking, depression, unusual weight change, abnormal bleeding, enlarged lymph nodes, and angioedema.     Objective:   Physical Exam      WD, WN, 70 y/o WM in NAD... GENERAL:  Alert & oriented; pleasant & cooperative. HEENT:  [place]/AT, EOM-wnl, PERRLA, EACs-clear, TMs-wnl, NOSE-clear, THROAT-clear & wnl. NECK:  Supple w/ fairROM; no JVD; normal carotid impulses w/o bruits; no thyromegaly or nodules palpated; no lymphadenopathy. CHEST:  Clear to P & A; without wheezes/ rales/ or rhonchi. HEART:  Regular Rhythm; without murmurs/ rubs/ or gallops. ABDOMEN:  Soft & nontender; normal bowel sounds; no organomegaly or masses detected. EXT: s/p R-TKR w/ pain/ swelling/ decr ROM etc; venous insuffic/ edema/ but Dopplers neg for DVT +Bakers cyst... NEURO:  CN's intact; motor testing normal; sensory testing normal; gait normal & balance OK. DERM:  No lesions noted; no rash etc...  RADIOLOGY DATA:  Reviewed in the EPIC EMR & discussed w/ the patient...  LABORATORY DATA:  Reviewed in the EPIC EMR & discussed w/ the patient...   Assessment & Plan:    OSA>  He uses his CPAP compliantly w/o problems or daytime hypersomnolence...  HBP>  Controlled on Coreg & Losartan; continue same...  CAD>  followed by DrCooper & most recent [place] w/ reduced LVF, Cath 1/16- his notes are reviewed...  HYPERLIPID>  Stable on Lip20, Fish Oil, etc; needs better low fat diet, he has DrCooper check his labs...  GI> HH, Hx stricture, s/p removal of duod adenoma>  Followed by  DrJacobs...  GI> Hx colon polyps>  F/u EGD & colon done 1/16 by DrJacobs...  GU> BPH, Low-T, ED>  Followed & managed by DrTannenbaum on Androgel & Flomax  DJD- knees and LBP>  He has seen DrDeveshwar for Rheum, DrOlin for Ortho  & DrCram for NS... ~  02/2016:  He had R-TKR w/ painful recoup period; Ven dopplers neg for DVT but + for Baker's cyst...    Patient's Medications  New Prescriptions   No medications on file  Previous Medications   ANDROGEL PUMP 20.25 MG/ACT (1.62%) GEL    Apply 2 Act topically daily. On each shoulder   ATORVASTATIN (LIPITOR) 20 MG TABLET    Take 1 tablet (20 mg total) by mouth daily at 6 PM.   B COMPLEX VITAMINS TABLET    Take 1 tablet by mouth daily.    CARVEDILOL (COREG) 12.5 MG TABLET    Take 1 tablet (12.5 mg total) by mouth 2 (two) times daily with a meal.   COENZYME Q10 (CO Q 10) 100 MG CAPS    Take 1 capsule by mouth daily.    DOCUSATE SODIUM (COLACE) 100 MG CAPSULE    Take 1 capsule (100 mg total) by mouth 2 (two) times daily.  FERROUS SULFATE 325 (65 FE) MG TABLET    Take 1 tablet (325 mg total) by mouth 3 (three) times daily after meals.   FISH OIL-OMEGA-3 FATTY ACIDS 1000 MG CAPSULE    Take 1 g by mouth daily.    FOLIC ACID (FOLVITE) A999333 MCG TABLET    Take 400 mcg by mouth daily.    GLUCOSAMINE-CHONDROITIN 1500-1200 MG/30ML LIQD    Take 1 tablet by mouth daily.    HYDROCODONE-ACETAMINOPHEN (NORCO) 7.5-325 MG TABLET    Take 1-2 tablets by mouth every 4 (four) hours as needed for moderate pain.   HYDROXYPROPYL METHYLCELLULOSE / HYPROMELLOSE (ISOPTO TEARS / GONIOVISC) 2.5 % OPHTHALMIC SOLUTION    Place 1 drop into both eyes daily as needed for dry eyes.   LOSARTAN (COZAAR) 100 MG TABLET    Take 1 tablet (100 mg total) by mouth daily.   MAGNESIUM PO    Take 1 tablet by mouth every other day.    METHOCARBAMOL (ROBAXIN) 500 MG TABLET    Take 1 tablet (500 mg total) by mouth every 6 (six) hours as needed for muscle spasms.   MULTIPLE VITAMIN (MULTIVITAMIN)  CAPSULE    Take 1 capsule by mouth daily.     MULTIVITAMIN-LUTEIN (OCUVITE-LUTEIN) CAPS CAPSULE    Take 1 capsule by mouth daily.   NABUMETONE (RELAFEN) 500 MG TABLET    Take 1 tablet (500 mg total) by mouth 2 (two) times daily.   NON FORMULARY    CPAP machine   PANTOPRAZOLE (PROTONIX) 40 MG TABLET    TAKE 1 TABLET BY MOUTH EVERY DAY BEFORE BREAKFAST   POLYETHYLENE GLYCOL (MIRALAX / GLYCOLAX) PACKET    Take 17 g by mouth 2 (two) times daily.   POTASSIUM PO    Take 1 tablet by mouth every other day.    PROBIOTIC PRODUCT (PROBIOTIC DAILY PO)    Take 1 capsule by mouth daily.    TAMSULOSIN (FLOMAX) 0.4 MG CAPS CAPSULE    Take 1 capsule (0.4 mg total) by mouth daily.   TRAMADOL (ULTRAM) 50 MG TABLET    Take 1 tablet (50 mg total) by mouth 3 (three) times daily as needed.   TURMERIC PO    Take 1 tablet by mouth daily.   Modified Medications   No medications on file  Discontinued Medications   ANASTROZOLE (ARIMIDEX) 1 MG TABLET    Take 1 mg by mouth every other day.

## 2016-04-17 DIAGNOSIS — M1711 Unilateral primary osteoarthritis, right knee: Secondary | ICD-10-CM | POA: Diagnosis not present

## 2016-04-18 ENCOUNTER — Ambulatory Visit (INDEPENDENT_AMBULATORY_CARE_PROVIDER_SITE_OTHER): Payer: Medicare Other | Admitting: Rheumatology

## 2016-04-18 ENCOUNTER — Encounter: Payer: Self-pay | Admitting: Rheumatology

## 2016-04-18 VITALS — BP 121/70 | HR 89 | Resp 14 | Wt 182.0 lb

## 2016-04-18 DIAGNOSIS — M19041 Primary osteoarthritis, right hand: Secondary | ICD-10-CM | POA: Diagnosis not present

## 2016-04-18 DIAGNOSIS — G8929 Other chronic pain: Secondary | ICD-10-CM | POA: Diagnosis not present

## 2016-04-18 DIAGNOSIS — Z96651 Presence of right artificial knee joint: Secondary | ICD-10-CM

## 2016-04-18 DIAGNOSIS — M19042 Primary osteoarthritis, left hand: Secondary | ICD-10-CM

## 2016-04-18 DIAGNOSIS — M79642 Pain in left hand: Secondary | ICD-10-CM

## 2016-04-18 DIAGNOSIS — M79641 Pain in right hand: Secondary | ICD-10-CM

## 2016-04-18 DIAGNOSIS — M25561 Pain in right knee: Secondary | ICD-10-CM

## 2016-04-18 DIAGNOSIS — M25562 Pain in left knee: Secondary | ICD-10-CM

## 2016-04-18 DIAGNOSIS — M170 Bilateral primary osteoarthritis of knee: Secondary | ICD-10-CM | POA: Diagnosis not present

## 2016-04-18 DIAGNOSIS — I2510 Atherosclerotic heart disease of native coronary artery without angina pectoris: Secondary | ICD-10-CM

## 2016-04-18 NOTE — Progress Notes (Signed)
Office Visit Note  Patient: [name]             Date of Birth: March 13, 1946           MRN: [id number]             PCP: [name], MD Referring: [name], MD Visit Date: 04/18/2016 Occupation: @GUAROCC @    Subjective:  Follow-up  Last seen in our office on 10/18/2015 for OA of the knee joint knee joint pain degenerative disc disease.  History of Present Illness: [name] is a 70 y.o. male  Dr Alvan Dame (approx 5 weeks ago ==> Sept 2017); doing rehab;  Doing well with the right knee joint. The left knee is doing well today but on the future visit patient states that he is having enough discomfort at the moment where he feels he may need a cortisone injection left knee. If he has pain and problems before the next appointment he will call me so I can give him a cortisone shot in the left knee to make him comfortable.   Activities of Daily Living:  Patient reports morning stiffness for 30 minutes.   Patient Reports nocturnal pain.  Difficulty dressing/grooming: Reports Difficulty climbing stairs: Reports Difficulty getting out of chair: Reports Difficulty using hands for taps, buttons, cutlery, and/or writing: Reports   Review of Systems  Constitutional: Negative for fatigue.  HENT: Negative for mouth sores and mouth dryness.   Eyes: Negative for dryness.  Respiratory: Negative for shortness of breath.   Gastrointestinal: Negative for constipation and diarrhea.  Musculoskeletal: Negative for myalgias and myalgias.  Skin: Negative for sensitivity to sunlight.  Neurological: Negative for memory loss.  Psychiatric/Behavioral: Negative for sleep disturbance.    PMFS History:  Patient Active Problem List   Diagnosis Date Noted  . Overweight (BMI 25.0-29.9) 03/14/2016  . 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  . Dyspnea 11/29/2014  . Synovial cyst of lumbar facet joint 09/26/2014  . Exertional angina (HCC)   . Incisional hernia, without obstruction or gangrene 12/06/2013    . Nausea alone 08/27/2013  . Colon cancer screening 08/27/2013  . Shortness of breath 07/03/2011  . 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 Benign prostatic hyperplasia with urinary obstruction 06/18/2010  . HEMOPTYSIS 06/24/2009  . OTHER SPECIFIED DISORDER OF STOMACH AND DUODENUM 12/16/2008  . BENIGN NEOPLASM OF LIVER AND BILIARY PASSAGES 11/15/2008  . GERD 09/02/2008  . COLONIC POLYPS, HYPERPLASTIC 05/24/2007  . Obstructive sleep apnea 05/24/2007  . ALLERGIC RHINITIS 05/24/2007  . ESOPHAGEAL STRICTURE 05/24/2007  . HIATAL HERNIA 05/24/2007  . Hyperlipidemia 02/06/2007  . Essential hypertension 02/06/2007  . Coronary atherosclerosis 02/06/2007  . Osteoarthritis 02/06/2007  . Backache 02/06/2007    Past Medical History:  Diagnosis Date  . Allergic rhinitis   . Anginal pain ([place]) 05/26/14   chest pain after chasing dog  . CAD (coronary artery disease)    hx of stent- 2005 RCA  . CHF (congestive heart failure) ([place])   . Chronic back pain    intermittent  . Constipation   . Dizziness   . Dysrhythmia    right bundle branch block   . Esophageal stricture   . GERD (gastroesophageal reflux disease)   . Hemoptysis   . Hiatal hernia   . History of colonic polyps    hyperplastic  . HTN (hypertension)   . Hyperlipidemia   . Hypertrophy of prostate with urinary obstruction and other lower urinary tract symptoms (LUTS)   . OA (osteoarthritis)   .  OSA (obstructive sleep apnea)    cpap- 10   . 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 Pneumonia   . Shortness of breath    with exertion  . Testicular hypofunction     Family History  Problem Relation Age of Onset  . Coronary artery disease Father   . Diabetes Father   . Sudden death Father     due to heart disease  . Heart disease Father   . Heart disease Mother   . Hypertension    . Stomach cancer Paternal Grandmother   . Colon cancer Neg  Hx   . Esophageal cancer Neg Hx    Past Surgical History:  Procedure Laterality Date  . CARDIAC CATHETERIZATION     '05, last 2009, showing patent RCA stent  . COLONOSCOPY  12/2007   HYPERPLASTIC POLYP  . COLONOSCOPY WITH PROPOFOL N/A 06/02/2014   Procedure: COLONOSCOPY WITH PROPOFOL;  Surgeon: [name], MD;  Location: [facility];  Service: Endoscopy;  Laterality: N/A;  . CORONARY ANGIOPLASTY  08/2003  . ESOPHAGOGASTRODUODENOSCOPY (EGD) WITH PROPOFOL N/A 06/02/2014   Procedure: ESOPHAGOGASTRODUODENOSCOPY (EGD) WITH PROPOFOL;  Surgeon: [name], MD;  [facility];  Service: Endoscopy;  Laterality: N/A;  . hernia surgery x 3     Bilateral Inguinal, Umbicial  . IRRIGATION AND DEBRIDEMENT ABSCESS Left 08/14/2012   Procedure: IRRIGATION AND DEBRIDEMENT LEFT INGUINAL BOIL ;  Surgeon: Ailene Rud, MD;  Location: [facility];  Service: Urology;  Laterality: Left;  . LEFT HEART CATHETERIZATION WITH CORONARY ANGIOGRAM N/A 05/26/2014   Procedure: LEFT HEART CATHETERIZATION WITH CORONARY ANGIOGRAM;  Surgeon: Blane Ohara, MD;  Location: [facility] CATH LAB;  Service: Cardiovascular;  Laterality: N/A;  . LUMBAR LAMINECTOMY/DECOMPRESSION MICRODISCECTOMY Left 09/26/2014   Procedure: Lumbar Laminectomy for resection of synovial cyst  Lumbar five- sacral one left;  Surgeon: Kary Kos, MD;  Location: [facility];  Service: Neurosurgery;  Laterality: Left;  . Oral surg to removed growth from ?sinus  10/2010   ?Dermoid removed by DrRiggs  . RCA stenting     '05 RCA  . right toe surgery  Right    Cyst   . s/p right knee arthroscopy  2005  . TOTAL KNEE ARTHROPLASTY Right 03/12/2016   Procedure: RIGHT TOTAL KNEE ARTHROPLASTY;  Surgeon: Paralee Cancel, MD;  Location: [facility];  Service: Orthopedics;  Laterality: Right;  . UPPER GASTROINTESTINAL ENDOSCOPY     Social History   Social History Narrative  . No narrative on file     Objective: Vital Signs: BP 121/70 (BP Location: Left Arm,  Patient Position: Sitting, Cuff Size: Large)   Pulse 89   Resp 14   Wt 182 lb (82.6 kg)   BMI 28.51 kg/m    Physical Exam  Constitutional: He is oriented to person, place, and time. He appears well-developed and well-nourished.  HENT:  Head: Normocephalic and atraumatic.  Eyes: Conjunctivae and EOM are normal. Pupils are equal, round, and reactive to light.  Neck: Normal range of motion. Neck supple.  Cardiovascular: Normal rate, regular rhythm and normal heart sounds.  Exam reveals no gallop and no friction rub.   No murmur heard. Pulmonary/Chest: Effort normal and breath sounds normal. No respiratory distress. He has no wheezes. He has no rales. He exhibits no tenderness.  Abdominal: Soft. He exhibits no distension and no mass. There is no tenderness. There is no guarding.  Musculoskeletal:  Normal range of motion.  Lymphadenopathy:    He has no cervical adenopathy.  Neurological: He is alert and oriented to person, place, and time. He exhibits normal muscle tone. Coordination normal.  Skin: Skin is warm and dry. Capillary refill takes less than 2 seconds. No rash noted.  Psychiatric: He has a normal mood and affect. His behavior is normal. Judgment and thought content normal.  Vitals reviewed.    Musculoskeletal Exam:  Full rom all joints S/p right knee replacement  Good and equal grip strength  No fms tender points    CDAI Exam: No CDAI exam completed.    Investigation: No additional findings. Labs in [place] from 03/14/2016 of CBC with differential CMP with GFR within normal limits.  Imaging: Dg Chest 2 View  Result Date: 03/27/2016 CLINICAL DATA:  Congestion, shortness of breath, fatigue EXAM: CHEST  2 VIEW COMPARISON:  12/06/2015 FINDINGS: Borderline cardiac enlargement without superimposed edema or CHF. Minor basilar atelectasis versus scarring. No focal pneumonia, collapse or consolidation. Negative for edema, effusion or pneumothorax. Trachea is midline. Minor  thoracic spondylosis noted. IMPRESSION: Stable exam.  No superimposed acute process. Electronically Signed   By: Jerilynn Mages.  Shick M.D.   On: 03/27/2016 15:46    Speciality Comments: No specialty comments available.    Procedures:  No procedures performed Allergies: Sulfamethoxazole-trimethoprim and Sulfonamide derivatives   Assessment / Plan:     Visit Diagnoses: Chronic pain of both knees  Osteoarthritis of both knees, unspecified osteoarthritis type  Osteoarthritis of both hands, unspecified osteoarthritis type  Bilateral hand pain  S/P TKR (total knee replacement), right - Dr Alvan Dame (approx 5 weeks ago ==> Sept 2017); doing rehab;   Doing well overall. Currently using medicines prescribed by Dr. Alvan Dame for his right total knee replacement. He will be finished with those medications and he has adequate amount of tramadol and Relafen at home. He will switch to those to manage his current pain from the knee replacement. I'm agreeable with this and I did caution the patient regarding addiction potential with the narcotic medication he will use it minimally.  I've also advised him to continue doing rehabilitation as prescribed by Dr. Alvan Dame.  Patient will continue to make his efforts for weight loss. He has a history of obesity. He will to proper diet and proper exercise. Currently due to his right knee pain from his right knee replacement for 5 weeks ago will be difficult for him to do the adequate amount of exercise. But he is planning on doing it as soon as he can.  Note that back in October 2012 on initial visit we ordered H would be 27 which was positive. We have done an MRI of the SI joints and they were negative. Please see those reports for full details. Currently patient was requesting water retreating him for an explained to him that were doing OA of the hands knees feet as well as DDD of the L-spine. Patient understands and is agreeable.  Return to clinic in 6 months  Orders: No orders  of the defined types were placed in this encounter.  No orders of the defined types were placed in this encounter.   Face-to-face time spent with patient was 30 minutes. 50% of time was spent in counseling and coordination of care.  Follow-Up Instructions: Return in about 6 months (around 10/16/2016) for oakj, knee pain, oa hand(severe).   Eliezer Lofts, PA-C   I examined and evaluated the patient with Eliezer Lofts PA. The plan of care  was discussed as noted above.  Bo Merino, MD

## 2016-04-19 DIAGNOSIS — M1711 Unilateral primary osteoarthritis, right knee: Secondary | ICD-10-CM | POA: Diagnosis not present

## 2016-04-22 DIAGNOSIS — M1711 Unilateral primary osteoarthritis, right knee: Secondary | ICD-10-CM | POA: Diagnosis not present

## 2016-04-24 ENCOUNTER — Ambulatory Visit (INDEPENDENT_AMBULATORY_CARE_PROVIDER_SITE_OTHER): Payer: Medicare Other | Admitting: Podiatry

## 2016-04-24 ENCOUNTER — Encounter: Payer: Self-pay | Admitting: Podiatry

## 2016-04-24 VITALS — Ht 67.0 in | Wt 182.0 lb

## 2016-04-24 DIAGNOSIS — I2510 Atherosclerotic heart disease of native coronary artery without angina pectoris: Secondary | ICD-10-CM

## 2016-04-24 DIAGNOSIS — L03032 Cellulitis of left toe: Secondary | ICD-10-CM

## 2016-04-24 DIAGNOSIS — L608 Other nail disorders: Secondary | ICD-10-CM

## 2016-04-24 DIAGNOSIS — M1711 Unilateral primary osteoarthritis, right knee: Secondary | ICD-10-CM | POA: Diagnosis not present

## 2016-04-24 NOTE — Progress Notes (Signed)
   Subjective:    Patient ID: [name], male    DOB: [birth year], 70 y.o.   MRN: [id number]  HPI this patient returns to the office with chief complaint of painful infected ingrown toenail on the big toe left foot. He says he went to a salon and his regular nail person was absent and the nail grow worked on his nails. Since that time he has developed a red, swollen, painful big toe outside border of the left great toe that is painful walking and wearing his shoes. He presents the office today for an evaluation and treatment of this condition    Review of Systems  All other systems reviewed and are negative.      Objective:   Physical Exam GENERAL APPEARANCE: Alert, conversant. Appropriately groomed. No acute distress.  VASCULAR: Pedal pulses are  palpable at  [facility] and PT bilateral.  Capillary refill time is immediate to all digits,  Normal temperature gradient.  Digital hair growth is present bilateral  NEUROLOGIC: sensation is normal to 5.07 monofilament at 5/5 sites bilateral.  Light touch is intact bilateral, Muscle strength normal.  MUSCULOSKELETAL: acceptable muscle strength, tone and stability bilateral.  Intrinsic muscluature intact bilateral.  Rectus appearance of foot and digits noted bilateral.   DERMATOLOGIC: skin color, texture, and turgor are within normal limits.  No preulcerative lesions or ulcers  are seen, no interdigital maceration noted.  No open lesions present.  . No drainage noted.  NAILS  Thick disfigured discolored nails both feet.  Pincer nails hallux  B/L  Red swollen lateral border left great toe.         Assessment & Plan:  Paronychia lateral border left great toe.  IE  Incision and Drainage lateral border left great toe. Home instructions given  RTC prn   Gardiner Barefoot DPM

## 2016-04-25 DIAGNOSIS — Z471 Aftercare following joint replacement surgery: Secondary | ICD-10-CM | POA: Diagnosis not present

## 2016-04-25 DIAGNOSIS — Z96651 Presence of right artificial knee joint: Secondary | ICD-10-CM | POA: Diagnosis not present

## 2016-04-29 DIAGNOSIS — M1711 Unilateral primary osteoarthritis, right knee: Secondary | ICD-10-CM | POA: Diagnosis not present

## 2016-05-19 ENCOUNTER — Encounter: Payer: Self-pay | Admitting: Emergency Medicine

## 2016-05-19 ENCOUNTER — Telehealth: Payer: Self-pay | Admitting: Emergency Medicine

## 2016-05-19 ENCOUNTER — Telehealth: Payer: Self-pay | Admitting: Pulmonary Disease

## 2016-05-19 MED ORDER — DOXYCYCLINE HYCLATE 100 MG PO TABS: 100.0000 mg | ORAL_TABLET | Freq: Two times a day (BID) | ORAL | 0 refills | Status: DC

## 2016-05-19 NOTE — Telephone Encounter (Signed)
Called by Kasandra Knudsen who relates that he has bronchits. Prescription for Azithromycin 250 mg, # 7, Sig: take 2 tabs PO on day 1, then 1 tab PO Q day unitl finished. No refill.

## 2016-05-19 NOTE — Telephone Encounter (Signed)
Pt reports having a deep cough that started about 3 days ago, sore throat. Productive of dark mucous. No fevers. Using OTC decongestants. Likely superimposed bronchitis, will treat w doxycycline x 7 days

## 2016-05-20 ENCOUNTER — Telehealth: Payer: Self-pay | Admitting: Emergency Medicine

## 2016-05-20 NOTE — Telephone Encounter (Signed)
Spoke with patient. He was able to get the azithro that was called last night. He never got the doxy that I called in for him 12/31. ? Something wrong with my e-prescribe. Will need to troubleshoot.

## 2016-05-24 ENCOUNTER — Ambulatory Visit (INDEPENDENT_AMBULATORY_CARE_PROVIDER_SITE_OTHER): Payer: Medicare Other | Admitting: Cardiovascular Disease

## 2016-05-24 ENCOUNTER — Encounter: Payer: Self-pay | Admitting: Cardiovascular Disease

## 2016-05-24 VITALS — BP 136/74 | HR 84 | Ht 68.0 in | Wt 185.6 lb

## 2016-05-24 DIAGNOSIS — E785 Hyperlipidemia, unspecified: Secondary | ICD-10-CM | POA: Diagnosis not present

## 2016-05-24 DIAGNOSIS — I428 Other cardiomyopathies: Secondary | ICD-10-CM | POA: Diagnosis not present

## 2016-05-24 DIAGNOSIS — I10 Essential (primary) hypertension: Secondary | ICD-10-CM

## 2016-05-24 DIAGNOSIS — I5022 Chronic systolic (congestive) heart failure: Secondary | ICD-10-CM | POA: Diagnosis not present

## 2016-05-24 NOTE — Progress Notes (Signed)
Cardiology Office Note Date:  05/24/2016   ID:  [name], DOB 1946/05/07, MRN [id number]  PCP:  [name], MD  Cardiologist:  [name], MD    Chief Complaint  Patient presents with  . Essential Hypertension     History of Present Illness: [name] is a 71 y.o. male who presents for follow-up evaluation. He has been followed for CAD with previous stenting of the RCA without recurrent ischemic events. He has also been followed for LBBB, HTN, and chronic dyspnea.   He had right knee replacement about 6 weeks ago. Overall he did well but he states his recovery has been slow. He's walking better than he was before surgery.   Complains of fatigue. Hasn't been eating well or exercising on a regular basis. No chest pain, orthopnea, or PND. Has had mild leg swelling left > right.    Past Medical History:  Diagnosis Date  . Allergic rhinitis   . Anginal pain ([place]) 05/26/14   chest pain after chasing dog  . CAD (coronary artery disease)    hx of stent- 2005 RCA  . CHF (congestive heart failure) ([place])   . Chronic back pain    intermittent  . Constipation   . Dizziness   . Dysrhythmia    right bundle branch block   . Esophageal stricture   . GERD (gastroesophageal reflux disease)   . Hemoptysis   . Hiatal hernia   . History of colonic polyps    hyperplastic  . HTN (hypertension)   . Hyperlipidemia   . Hypertrophy of prostate with urinary obstruction and other lower urinary tract symptoms (LUTS)   . OA (osteoarthritis)   . OSA (obstructive sleep apnea)    cpap- 10   . 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 Pneumonia   . Shortness of breath    with exertion  . Testicular hypofunction     Past Surgical History:  Procedure Laterality Date  . CARDIAC CATHETERIZATION     '05, last 2009, showing patent RCA stent  . COLONOSCOPY  12/2007   HYPERPLASTIC POLYP  .  COLONOSCOPY WITH PROPOFOL N/A 06/02/2014   Procedure: COLONOSCOPY WITH PROPOFOL;  Surgeon: [name], MD;  [facility];  Service: Endoscopy;  Laterality: N/A;  . CORONARY ANGIOPLASTY  08/2003  . ESOPHAGOGASTRODUODENOSCOPY (EGD) WITH PROPOFOL N/A 06/02/2014   Procedure: ESOPHAGOGASTRODUODENOSCOPY (EGD) WITH PROPOFOL;  Surgeon: [name], MD;  [facility];  Service: Endoscopy;  Laterality: N/A;  . hernia surgery x 3     Bilateral Inguinal, Umbicial  . IRRIGATION AND DEBRIDEMENT ABSCESS Left 08/14/2012   Procedure: IRRIGATION AND DEBRIDEMENT LEFT INGUINAL BOIL ;  Surgeon: Ailene Rud, MD;  [facility];  Service: Urology;  Laterality: Left;  . LEFT HEART CATHETERIZATION WITH CORONARY ANGIOGRAM N/A 05/26/2014   Procedure: LEFT HEART CATHETERIZATION WITH CORONARY ANGIOGRAM;  Surgeon: Blane Ohara, MD;  Location[facility] CATH LAB;  Service: Cardiovascular;  Laterality: N/A;  . LUMBAR LAMINECTOMY/DECOMPRESSION MICRODISCECTOMY Left 09/26/2014   Procedure: Lumbar Laminectomy for resection of synovial cyst  Lumbar five- sacral one left;  Surgeon: Kary Kos, MD;  Location: [place] [facility] OR[facility];  Service: Neurosurgery;  Laterality: Left;  . Oral surg to removed growth from ?sinus  10/2010   ?Dermoid removed by DrRiggs  . RCA stenting     '05 RCA  . right toe surgery  Right  Cyst   . s/p right knee arthroscopy  2005  . TOTAL KNEE ARTHROPLASTY Right 03/12/2016   Procedure: RIGHT TOTAL KNEE ARTHROPLASTY;  Surgeon: Paralee Cancel, MD;  [facility];  Service: Orthopedics;  Laterality: Right;  . UPPER GASTROINTESTINAL ENDOSCOPY      Current Outpatient Prescriptions  Medication Sig Dispense Refill  . ANDROGEL PUMP 20.25 MG/ACT (1.62%) GEL Apply 2 Act topically daily. On each shoulder    . atorvastatin (LIPITOR) 20 MG tablet Take 1 tablet (20 mg total) by mouth daily at 6 PM. 90 tablet 3  . azithromycin (ZITHROMAX) 250 MG tablet Take 250 mg by mouth daily.  0  . b complex  vitamins tablet Take 1 tablet by mouth daily.     . carvedilol (COREG) 12.5 MG tablet Take 1 tablet (12.5 mg total) by mouth 2 (two) times daily with a meal. 180 tablet 3  . Coenzyme Q10 (CO Q 10) 100 MG CAPS Take 1 capsule by mouth daily.     Marland Kitchen docusate sodium (COLACE) 100 MG capsule Take 1 capsule (100 mg total) by mouth 2 (two) times daily. 10 capsule 0  . doxycycline (VIBRA-TABS) 100 MG tablet Take 1 tablet (100 mg total) by mouth 2 (two) times daily. 14 tablet 0  . ferrous sulfate 325 (65 FE) MG tablet Take 1 tablet (325 mg total) by mouth 3 (three) times daily after meals.  3  . fish oil-omega-3 fatty acids 1000 MG capsule Take 1 g by mouth daily.     . folic acid (FOLVITE) A999333 MCG tablet Take 400 mcg by mouth daily.     . Glucosamine-Chondroitin 1500-1200 MG/30ML LIQD Take 1 tablet by mouth daily.     Marland Kitchen HYDROcodone-acetaminophen (NORCO) 7.5-325 MG tablet Take 1-2 tablets by mouth every 4 (four) hours as needed for moderate pain. 60 tablet 0  . hydroxypropyl methylcellulose / hypromellose (ISOPTO TEARS / GONIOVISC) 2.5 % ophthalmic solution Place 1 drop into both eyes daily as needed for dry eyes.    Marland Kitchen losartan (COZAAR) 100 MG tablet Take 1 tablet (100 mg total) by mouth daily. 90 tablet 3  . MAGNESIUM PO Take 1 tablet by mouth every other day.     . methocarbamol (ROBAXIN) 500 MG tablet Take 1 tablet (500 mg total) by mouth every 6 (six) hours as needed for muscle spasms. 40 tablet 0  . Multiple Vitamin (MULTIVITAMIN) capsule Take 1 capsule by mouth daily.      . multivitamin-lutein (OCUVITE-LUTEIN) CAPS capsule Take 1 capsule by mouth daily.    . nabumetone (RELAFEN) 500 MG tablet Take 1 tablet (500 mg total) by mouth 2 (two) times daily. 180 tablet 0  . NON FORMULARY CPAP machine    . pantoprazole (PROTONIX) 40 MG tablet TAKE 1 TABLET BY MOUTH EVERY DAY BEFORE BREAKFAST 90 tablet 3  . polyethylene glycol (MIRALAX / GLYCOLAX) packet Take 17 g by mouth 2 (two) times daily. 14 each 0  .  POTASSIUM PO Take 1 tablet by mouth every other day.     . Probiotic Product (PROBIOTIC DAILY PO) Take 1 capsule by mouth daily.     . tamsulosin (FLOMAX) 0.4 MG CAPS capsule Take 0.4 mg by mouth at bedtime.    . traMADol (ULTRAM) 50 MG tablet Take 1 tablet (50 mg total) by mouth 3 (three) times daily as needed. 270 tablet 0  . TURMERIC PO Take 1 tablet by mouth daily.      No current facility-administered medications for this visit.  Allergies:   Sulfamethoxazole-trimethoprim and Sulfonamide derivatives   Social History:  The patient  reports that he quit smoking about 26 years ago. He has never used smokeless tobacco. He reports that he drinks alcohol. He reports that he does not use drugs.   Family History:  The patient's  family history includes Coronary artery disease in his father; Diabetes in his father; Heart disease in his father and mother; Stomach cancer in his paternal grandmother; Sudden death in his father.    ROS:  Please see the history of present illness.  Otherwise, review of systems is positive for cough, shortness of breath, wheezing.  All other systems are reviewed and negative.    PHYSICAL EXAM: VS:  BP 136/74   Pulse 84   Ht 5\' 8"  (1.727 m)   Wt 185 lb 9.6 oz (84.2 kg)   BMI 28.22 kg/m  , BMI Body mass index is 28.22 kg/m. GEN: Well nourished, well developed, in no acute distress  HEENT: normal  Neck: no JVD, no masses. No carotid bruits Cardiac: RRR without murmur or gallop                Respiratory:  clear to auscultation bilaterally, normal work of breathing GI: soft, nontender, nondistended, + BS MS: no deformity or atrophy  Ext: 1+ pretibial edema on the right and trace pretibial edema on the left, pedal pulses 2+= bilaterally Skin: warm and dry, no rash Neuro:  Strength and sensation are intact Psych: euthymic mood, full affect  EKG:  EKG is not ordered today.  Recent Labs: 11/06/2015: Magnesium 2.3 12/07/2015: ALT 17; TSH 2.80 03/14/2016:  BUN 27; Creatinine, Ser 1.03; Hemoglobin 11.2; Platelets 196; Potassium 3.7; Sodium 137   Lipid Panel     Component Value Date/Time   CHOL 134 12/07/2015 0742   TRIG 153.0 (H) 12/07/2015 0742   HDL 31.50 (L) 12/07/2015 0742   CHOLHDL 4 12/07/2015 0742   VLDL 30.6 12/07/2015 0742   LDLCALC 72 12/07/2015 0742      Wt Readings from Last 3 Encounters:  05/24/16 185 lb 9.6 oz (84.2 kg)  04/24/16 182 lb (82.6 kg)  04/18/16 182 lb (82.6 kg)     Cardiac Studies Reviewed: Echo 03-08-2015: Study Conclusions  - Left ventricle: The cavity size was normal. Wall thickness was  increased in a pattern of mild LVH. Systolic function was  moderately reduced. The estimated ejection fraction was in the  range of 35% to 40%. Septal-lateral dyssynchrony with diffuse  hypokinesis appearing worse in the septum. Doppler parameters are  consistent with abnormal left ventricular relaxation (grade 1  diastolic dysfunction). - Aortic valve: There was no stenosis. There was trivial  regurgitation. - Mitral valve: There was mild regurgitation. - Left atrium: The atrium was mildly dilated. - Right ventricle: The cavity size was normal. Systolic function  was normal. - Tricuspid valve: Peak RV-RA gradient (S): 23 mm Hg. - Pulmonary arteries: PA peak pressure: 26 mm Hg (S). - Inferior vena cava: The vessel was normal in size. The  respirophasic diameter changes were in the normal range (= 50%),  consistent with normal central venous pressure.  Impressions:  - Normal LV size with mild LV hypertrophy. EF 35-40% with  septal-lateral dyssynchrony and diffuse hypokinesis appearing  worse in the septum. Normal RV size and systolic function. Mild  MR. If ICD is being considered, could do cardiac MRI.  ASSESSMENT AND PLAN: 1.  CAD, native vessel, without angina: pt stable without anginal symptoms. Medications reviewed  and will be continued without change.   2. Essential HTN: BP controlled.  Really needs to work on lifestyle with weight loss, exercise, diet. Discussed strategies.  3. Hyperlipidemia: treated with a statin drug, LDL has been below 70 mg/dL.   4. Chronic systolic CHF, NYHA II: echo reviewed as above - moderate LV systolic dysfunction. On appropriate medical therapy (ARB/carvedilol). No change in symptoms over recent years.  Current medicines are reviewed with the patient today.  The patient does not have concerns regarding medicines.  Labs/ tests ordered today include:  No orders of the defined types were placed in this encounter.   Disposition:   FU 6 months  Signed, [name], MD  05/24/2016 9:09 AM    [facility] [place], [place], [place]  [address] Phone: [phone]; Fax: [phone]

## 2016-05-24 NOTE — Patient Instructions (Signed)

## 2016-05-27 ENCOUNTER — Other Ambulatory Visit (INDEPENDENT_AMBULATORY_CARE_PROVIDER_SITE_OTHER): Payer: Medicare Other

## 2016-05-27 ENCOUNTER — Ambulatory Visit (INDEPENDENT_AMBULATORY_CARE_PROVIDER_SITE_OTHER)
Admission: RE | Admit: 2016-05-27 | Discharge: 2016-05-27 | Disposition: A | Discharge status: Home / Self Care | Payer: Medicare Other | Source: Ambulatory Visit | Attending: Pulmonary Disease | Admitting: Pulmonary Disease

## 2016-05-27 ENCOUNTER — Telehealth: Payer: Self-pay | Admitting: Pulmonary Disease

## 2016-05-27 ENCOUNTER — Encounter: Payer: Self-pay | Admitting: Pulmonary Disease

## 2016-05-27 ENCOUNTER — Ambulatory Visit (INDEPENDENT_AMBULATORY_CARE_PROVIDER_SITE_OTHER): Payer: Medicare Other | Admitting: Pulmonary Disease

## 2016-05-27 VITALS — BP 128/82 | HR 78 | Temp 97.7°F | Ht 67.0 in | Wt 186.0 lb

## 2016-05-27 DIAGNOSIS — M159 Polyosteoarthritis, unspecified: Secondary | ICD-10-CM

## 2016-05-27 DIAGNOSIS — R042 Hemoptysis: Secondary | ICD-10-CM

## 2016-05-27 DIAGNOSIS — M150 Primary generalized (osteo)arthritis: Secondary | ICD-10-CM

## 2016-05-27 DIAGNOSIS — J40 Bronchitis, not specified as acute or chronic: Secondary | ICD-10-CM | POA: Diagnosis not present

## 2016-05-27 DIAGNOSIS — J189 Pneumonia, unspecified organism: Secondary | ICD-10-CM | POA: Diagnosis not present

## 2016-05-27 DIAGNOSIS — I2510 Atherosclerotic heart disease of native coronary artery without angina pectoris: Secondary | ICD-10-CM | POA: Diagnosis not present

## 2016-05-27 DIAGNOSIS — Z96651 Presence of right artificial knee joint: Secondary | ICD-10-CM

## 2016-05-27 DIAGNOSIS — G4733 Obstructive sleep apnea (adult) (pediatric): Secondary | ICD-10-CM | POA: Diagnosis not present

## 2016-05-27 DIAGNOSIS — I10 Essential (primary) hypertension: Secondary | ICD-10-CM

## 2016-05-27 DIAGNOSIS — R0609 Other forms of dyspnea: Secondary | ICD-10-CM

## 2016-05-27 LAB — COMPREHENSIVE METABOLIC PANEL
ALT: 17 U/L (ref 0–53)
AST: 19 U/L (ref 0–37)
Albumin: 4.1 g/dL (ref 3.5–5.2)
Alkaline Phosphatase: 79 U/L (ref 39–117)
BILIRUBIN TOTAL: 1.1 mg/dL (ref 0.2–1.2)
BUN: 20 mg/dL (ref 6–23)
CO2: 26 meq/L (ref 19–32)
Calcium: 9.9 mg/dL (ref 8.4–10.5)
Chloride: 105 mEq/L (ref 96–112)
Creatinine, Ser: 0.83 mg/dL (ref 0.40–1.50)
GFR: 96.73 mL/min (ref >60.00)
GLUCOSE: 119 mg/dL — AB (ref 70–99)
POTASSIUM: 4.2 meq/L (ref 3.5–5.1)
Sodium: 139 mEq/L (ref 135–145)
Total Protein: 7.2 g/dL (ref 6.0–8.3)

## 2016-05-27 LAB — CBC WITH DIFFERENTIAL/PLATELET
BASOS ABS: 0 10*3/uL (ref 0.0–0.1)
Basophils Relative: 0.6 % (ref 0.0–3.0)
EOS PCT: 3 % (ref 0.0–5.0)
Eosinophils Absolute: 0.2 10*3/uL (ref 0.0–0.7)
HEMATOCRIT: 39.9 % (ref 39.0–52.0)
Hemoglobin: 13.6 g/dL (ref 13.0–17.0)
LYMPHS ABS: 1.8 10*3/uL (ref 0.7–4.0)
Lymphocytes Relative: 23.1 % (ref 12.0–46.0)
MCHC: 34.1 g/dL (ref 30.0–36.0)
MCV: 85.5 fl (ref 78.0–100.0)
MONOS PCT: 7.4 % (ref 3.0–12.0)
Monocytes Absolute: 0.6 10*3/uL (ref 0.1–1.0)
NEUTROS ABS: 5 10*3/uL (ref 1.4–7.7)
Neutrophils Relative %: 65.9 % (ref 43.0–77.0)
PLATELETS: 343 10*3/uL (ref 150.0–400.0)
RBC: 4.67 Mil/uL (ref 4.22–5.81)
RDW: 14.4 % (ref 11.5–15.5)
WBC: 7.6 10*3/uL (ref 4.0–10.5)

## 2016-05-27 LAB — D-DIMER, QUANTITATIVE (NOT AT ARMC): D DIMER QUANT: 2.32 ug{FEU}/mL — AB (ref <0.50)

## 2016-05-27 MED ORDER — LEVOFLOXACIN 500 MG PO TABS: 500.0000 mg | ORAL_TABLET | Freq: Every day | ORAL | 0 refills | Status: DC

## 2016-05-27 MED ORDER — PREDNISONE 20 MG PO TABS: ORAL_TABLET | ORAL | 0 refills | Status: DC

## 2016-05-27 MED ORDER — HYDROCODONE-HOMATROPINE 5-1.5 MG/5ML PO SYRP: 5.0000 mL | ORAL_SOLUTION | Freq: Four times a day (QID) | ORAL | 0 refills | Status: DC | PRN

## 2016-05-27 MED ORDER — IOPAMIDOL (ISOVUE-370) INJECTION 76%
80.0000 mL | Freq: Once | INTRAVENOUS | Status: AC | PRN
  Administered 2016-05-27: 80 mL via INTRAVENOUS

## 2016-05-27 NOTE — Patient Instructions (Signed)
Today we updated your med list in our EPIC system...    Continue your current medications the same...  Today we ordered a CXR & bllod work...    We will also check a CT Chest to eval the lungs further & rule out a blood clot in light of your recent knee surg...       We will contact you w/ the results when available...   We will commence treatmentr w/ another antibiotic- LEVAQUIN 500mg  one tab daily til gone...  plus we will treat w/ a tapering course of PREDNISONE 20mg  tabs...    Start w/ one tab twice daily for 4 days...    Then decrease to 1 tab each AM for 4 days...    Then decrease to 1/2 tab each AM for 4 days...    Then decrease to 1/2 tab every other day til gone (1/2, 0, 1/2, 0, etc)...  We also wrote for [facility] (generic) cough syrup to take every 4-6H as needed...  Call for any questions...  Let's plan a follow up visit in 3-4weeks, sooner if needed for breathing problems.Marland KitchenMarland Kitchen

## 2016-05-27 NOTE — Telephone Encounter (Signed)
Pt scheduled for acute visit with SN today @ 05/28/15  @ 11pm. Pt voiced understanding. Nothing further needed.

## 2016-05-28 NOTE — Telephone Encounter (Signed)
I called pt w/ reports of CXR, Labs, CTAngio-- [facility]

## 2016-05-28 NOTE — Telephone Encounter (Signed)
SN pt had labs done yesterday and [facility] called today with a d dimer of 2.32.  All labs are in the computer and the pt did have the ct angio.  Please advise. thanks

## 2016-05-31 NOTE — Progress Notes (Signed)
Subjective:    Patient ID: [name], male    DOB: Jun 03, 1945, 71 y.o.   MRN: [id number]  HPI 71 y/o WM here for a follow up visit... he has multiple medical problems as noted below...   ~  SEE PREV EPIC NOTES FOR OLDER DATA >>   ~  June 18, 2010:  He's had a good 26mo> still c/o bilat CTS symtpms in the AM & he will try the wrist splints Qhs...  he was started on AXIRON topical testosterone Rx per DrTannenbaum & improved...  ~  January 07, 2011:  He continues to f/u w/ DrCooper for Cards every 43mo- seen last wk> CAD, s/p stent RCA 2005, LBBB; last cath was 2009 w/ patent RCA stent & mild non-obstructive dis elsewhere; doing well, exercising regularly, no CP/ palpit/ SOB/ edema/ etc; rec to continue ASA/ Plavix,   LABS 2/13:  FLP- elevTG & lowHDL on Cres10;  Chems- wnl;  CBC- wnl;  TSH=1.91;  PSA=2.08;  VitD=58;  UA- clear  LABS 7/13 by DrDeveshwar:  Chems- wnl x BS=109;  CBC- wnl  ~  November 29, 2014:  3 year ROV & [name] returns after a long hiatus, expertly cared for in the interval by DrCooper for Cards, DrCram for NS, DrDeveshwar for Rheum, DrTannenbaum for Urology, and DrCornett for CCS... He tells me that he needs 2 knees, had lumbar lam by DrCram about 59mo ago, and he wants to see ENT due to losing his voice- voice getting weaker assoc w/ dry cough, denies sput, c/o mild SOB "it's my heart"    OSA> stable on CPAP w/ supplies Q4mo from [place]; no issues w/ his machine or interface; rests well, wakes refreshed, no daytime hypersomnolence & no prob w/ alertness...    HBP & CAD, s/p stent in RCA 2005, LBBB, decr EF w/ septal dysynergy, chr dyspnea> followed by DrCooper on ASA/ Plavix/ Coreg12.5Bid/ Cozaar100; BP= 142/72 & he denies CP, palpit, dizzy, SOB, edema; last saw DrCooper 07/2014 & his note is reviewed    CHOL> on Lip20, FishOil, CoQ10; FLP 9/15 showed TChol 125, TG 154, HDL 30, LDL 64; needs better low fat diet...    GI> HH, Hx stricture, antral nodule=adenoma> on Protonix40 followed  by Ardis Hughs (see below); colonoscopy 1/16 was neg/ wnl...    GU> BPH, LTOS, Low-T> on Flomax0.4 & Androgel per DrTannenbaum; he relates a story about GU symptoms & extensive work up in their office "doing just fine" he says, incr energy, etc; treated for seminal vesiculitis w/ Doxy in the past; last seen 3/16 and note reviewed...     Ortho> DJD, LBP> prev on Celebrex & switched to Etodolac & now Percocet & Tramadol; he notes that he's been hurting since he was 71 y/o w/ a strong fam hx arthritis; he saw DrDeveshwar for Rheum & DrCram for NS- s/p Lumbar decompression for resection of synovial cyst & large bone spur L5-S1    Derm> Hx skin cancers treated by DrDJones... EXAM showed Afeb, VSS, O2sat=96% on RA;  HEENT- neg, mallampati1;  Chest- clear w/o w/r/r;  Heart- RR gr1/6 SEM S4 w/o rubs;  Abd- soft, nontender, neg;  Ext- neg w/o c/c/e;  Neuro- intact... We reviewed prob list, meds, xrays and labs> see below for updates >> given TDAP & Prevnar-13, he reports that he got the Pneumovax23 at health dept in 2009...  2DEcho 07/22/11 showed mild LVH, & decr LVF w/ EF~30% w/ HK, Gr1 DD, mild MR, mild LAdil, ..   CXR 08/13/12  showed norm heart size, clear lungs, NAD.Marland KitchenMarland Kitchen   EKG 05/24/14 showed NSR, rate76, LBBB, no acute changes...  CATH [id number] showed patent coronaries and open stent in RCA, nonobstructive dis in LAD, mild to mod LVD (EF=50%) w/ norm LVEDP- medical management.  Spirometry 11/29/14 showed FVC=2.84 (67%), FEV1=2.05 (63%), %1sec=72, mid-flows reduced at 46% predicted; c/w mild airflow obstruction but can't r/o superimposed restriction w/o lung volumes, etc...   LABS 5/16 showed Chems- wnl & CBC- wnl IMP/PLAN>>  A lot to get caught up on after a 3 yr hiatus;  meds refilled, he wants ZPak for prn use, TDAP & Prevnar-13 today, refer to ENT for check due to losing his voice...  ~  December 06, 2015:  Yearly [place] f/u visit>  [name] reports an number of things that have occurred over the past yr:  1)  root canal done yest, on ?Clinda300tid (didn't bring meds or list to todays visit) & reminded to take Probiotic daily + Activia yogurt;  2) has "stress fx" R knee, eval by DrOlin- wearing brace & has cane;  3) Back surg 5/16 by DrCram w/ "cyst" removed;  4) he says XRays and MRIs have shown that he is "eaten up w/ arthritis" and there is nothing else that they can do; he sees Rheum- DrDeveshwar & she will check him before he travels & give him a shot... 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    HBP & CAD, s/p stent in RCA 2005, LBBB, decr EF 35-40% w/ septal dysynergy, chr dyspnea> followed by DrCooper off Plavix, on ASA/ Coreg12.5Bid/ Cozaar100; BP= 128/72 & he denies CP, palpit, dizzy, SOB, edema; last saw DrCooper 6/17 & his note is reviewed- HBP, CAD, s/p PCI to RCA, LBBB, no recurrent ischemia (cath 1/16 w/ patent stent) & he has chr dyspnea; lim in exerc by his knee prob; he worries a lot about statin related side effects.    CHOL> on Lip20, FishOil, CoQ10; FLP 10/16 showed TChol 121, TG 160, HDL 32, LDL 58; needs better low fat diet...    GI> HH, Hx stricture, antral nodule=adenoma> on Protonix40 followed by DrJacobs (see below); EGD 1/16 showed 3cmHH & schatzki's ring, duodenal adenoma resected & a subepithelial antral lesion; colonoscopy 1/16 was neg/ wnl...    GU> BPH, LTOS, Low-T> on Flomax0.4 & Androgel per DrTannenbaum; he relates a story about GU symptoms & extensive work up in their office "doing just fine" he says, incr energy, etc; treated for seminal vesiculitis w/ Doxy in the past; last seen 6/17 and note  reviewed- LTOS & Low-T, on Flomax & Androgel...    Ortho> DJD, LBP> prev on Celebrex & Percocet, switched to Relafen & Tramadol; he notes that he's been hurting since he was 71 y/o w/ a strong fam hx arthritis; he saw DrDeveshwar for Rheum & DrCram for NS- s/p Lumbar decompression for resection of synovial cyst & large bone spur L5-S1    Derm> Hx skin cancers treated by DrDJones... EXAM showed Afeb, VSS, O2sat=96% on RA;  HEENT- neg, mallampati1;  Chest- clear w/o w/r/r;  Heart- RR  gr1/6 SEM S4 w/o rubs;  Abd- soft, nontender, neg;  Ext- neg w/o c/c/e;  Neuro- intact...  CXR 12/06/15> heart at upper lim of norm, lungs essentially clear w/ some scarring on left, NAD...  FASTING blood work> he never returned for FASTING labs as requested... IMP/PLAN>>  [name] appears stable w/ his medical care distributed among his specialists- DrCooper, DrJacobs, DrTannenbaum, DrDeveshwar, DrCram; continue present meds...   ~  April 15, 2016:  34mo [place] saw SG 3wks ago w/ nonprod cough, low grade temp~100, weakness; he had R-TKR 03/12/16 by DrOlin & post op rehab has been rough- on musc relaxer & Norco, Celebrex added & using a walker;  Exam was neg- min leg swelling, chest clear, VSS;  He was given a ZPak & checked VenDopplers- neg, no DVT;  He is here for f/u today- still in lots of pain from the knee surg but getting better slowly he says & the ZPak really helped overall;  He notes that he may need his left knee done later! EXAM showed Afeb, VSS, O2sat=97% on RA;  HEENT- neg, mallampati1;  Chest- clear w/o w/r/r;  Heart- RR gr1/6 SEM S4 w/o rubs;  Abd- soft, nontender, neg;  Ext- s/p R-TKR, mild edema, no c/c;  Neuro- intact...  CXR 03/27/16 (independently reviewed by me in the PACS system)>  Borderline heart size w/o edema, mild basilar atx vs scarring, NAD.Marland KitchenMarland Kitchen   Ven Dopplers 03/28/16>  RLE neg for DVT, pos for baker's cyst...  LABS 02/2016 in Epic>  Chems- wnl x BS=130 range;  CBC- mild anemia post op w/  Hg=11.2, mcv=90;  Blood type= Opos IMP/PLAN>>  [name] is slowly improving- URI resolved, getting better w/ exercise/ actiity/ to start PT etc;  OK 2017 Flu vaccine today & we'll plan ROV ~38mo...  ~  May 27, 2016:  6wk ROV & add-on appt requested for hemoptysis>  [name] says he developed a cough around Christmas week, deep cough, small amt green sput,  Low grade fever w/o c/s, sl incr SOB, no CP etc;  DrByrum called in a ZPak & the sput seemed to clear, then 3-4d ago he noted small amt streaky hemoptysis but SOB was better & still no discomfort noted;  He feels weak, had Cards f/u DrCooper & doing satis- told to call us for f/u appt;      OSA> stable on CPAP w/ supplies Q31mo from [place]; no issues w/ his machine or interface; rests well, wakes refreshed, no daytime hypersomnolence & no prob w/ alertness; he had ENT eval 10/16 by DrByers for hoarseness & losing his voice, exam was NEG, 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    HBP & CAD, s/p stent in RCA 2005, LBBB, decr EF 35-40% w/ septal dysynergy, chr dyspnea> followed by DrCooper off Plavix, on ASA/ Coreg12.5Bid/ Cozaar100; BP= 128/72 & he denies CP, palpit, dizzy, SOB, edema; last saw DrCooper 6/17 & his note is reviewed- HBP, CAD, s/p PCI to RCA, LBBB, no recurrent ischemia (cath 1/16 w/ patent stent) & he has chr dyspnea; lim in exerc by his knee prob; he worries a lot about statin related side effects.    CHOL> on Lip20, FishOil, CoQ10; FLP 7/17 showed TChol 134, TG 153, HDL 32, LDL 72; needs better low fat diet...    GI> HH, Hx stricture, antral nodule=adenoma> on Protonix40 followed by DrJacobs (see below); EGD 1/16 showed 3cmHH & schatzki's ring, duodenal adenoma resected &  a subepithelial antral lesion; colonoscopy 1/16 was neg/ wnl...    GU> BPH, LTOS, Low-T> on Flomax0.4 & Androgel per DrTannenbaum; he relates a story about GU symptoms & extensive work up in their office "doing just  fine" he says, incr energy, etc; treated for seminal vesiculitis w/ Doxy in the past; last seen 6/17 and note reviewed- LTOS & Low-T, on Flomax & Androgel...    Ortho> DJD, LBP> prev on Celebrex & Percocet, switched to [place]; he notes that he's been hurting since he was 71 y/o w/ a strong fam hx arthritis; he saw DrDeveshwar for Rheum, DrOlin for Ortho, & DrCram for NS- s/p Lumbar decompression for resection of synovial cyst & large bone spur L5-S1; s/p R-TKR 10/17 w/ painful recovery...    Derm> Hx skin cancers treated by DrDJones...    EXAM confirms Afeb, VSS w/ BP=128/82, O2sat=96% on RA;  HEENT- neg, mallampati2;  Chest- clear w/o w/r/r;  Heart- RR w/o m/r/g heard;  Abd- soft, nontender, neg;  Ext- s/p R-TKR, decr ROM, VI, tr edema w/o c/c;  Neuro- ok.  CXR 05/27/16 (independently reviewed by me in the PACS system)>  Borderline heart size, mild patchy lingular density & ?vol loss, otherw clear- r/o lingular pneumonia...  CT Angio Chest 05/27/16 (independently reviewed by me in the PACS system)>  NEG for PE, borderline heart size, mild coronary calcif, no adenopathy, perihilar GGO c/w pneumonia, mild atx...   LABS 05/27/16>  D-dimer is pos at 2.32;  Chems- wnl x BS=119;  CBC- wnl w/ Hg=13.6, WBC=7.6K & norm diff;   IMP/PLAN>>  Hemoptysis w/ poss CAP- on ASA, s/p TKR in recovery, and eval is discordant w/ ?low grade fever, normal wbc, pos D-dimer, neg CTA (w/o PE), prev neg VenDoppler;  CXR & CT w/ GGO right perihilar suggesting infection & we decided to treat w/ Levaquin, Pred20mg -4d taper, Hycodan, and short term f/u appt...           PROBLEM LIST:  ENT >> some type of ENT/ Oral surg 7/12> referred by his dentist to [facility] w/ surg thru gums to remove a growth in his sinus= ?dermoid...  OBSTRUCTIVE SLEEP APNEA - Hx OSA w/ sleep study 11/02 RDI=18 and desat to 85%; seen by DrClance and on CPAP rx;  he's been using it regularly, denies daytime hypersomnolence, etc;  He gets machine check &  new mask Q76mo thru [employer] & he reports download showed 12,000 hrs use! ~  CXR 3/10 clear w/ min left basilar scarring... ~  Pt gets his CPAP machine checked & masks replaced every 2mo thru [place]... ~  CXR 10/12 showed normal heart size, clear lungs x left base atx, mild degen changes in TSpine...  Episode of [place] 4/10 after episode of BRB hemoptysis> full eval was neg, no recurrence... he had bronchoscopy- neg,  ENT eval DrRosen- neg, noted some persistant cough treated w/ Mucinex DM, Zyrtek, Tussionex...  no recurrence. ~  NOTE> he is an exsmoker: started at ~11, smoked up til ~41, for a 30 yr smoking hx & he quit >30 yrs ago... ~  05/2016>  Another episode of streaky hemoptysis 6wks after R-TKR, in recovery phase, no CP, mild SOB, low grade fever & eval was discordant w/ low grade fever, norm WBC, ?lingular opac on CXR/CT, pos D-dimer but neg Vendopplers=> we decided to treat w/ Levaquin, Pred taper, Hycodan & short-term ROV...  HBP & CAD-S/P stent in RCA on 2005 - followed regularly by DrCooper on ASA 81mg /d,  off PLAVIX,  COREG 12.5mg Bid,  LOSARTAN 100mg /d...  BP= 120/72 today & similar at home> denies HA, fatigue, visual changes, CP, palipit, dizziness, syncope, dyspnea, edema, etc...  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 ~  2/13: he had f/u DrCooper> still c/o fatigue & dyspnea; no angina, BP controlled, BNP=25, EKG showed NSR, rate72, LBBB;  2DEcho & cardiac MRI are planned... ~  2DEcho 3/13 showed decr EF=30% w/ HK, mild LVH, Gr1DD, mild MR ~  3/13:   Cardiac MRI showed mild LVenlargement, mild LAE, no scar or infarct, no ischemia, no regional wall motion abn, but there was abn septal motion & EF=48%  HYPERLIPIDEMIA - he follows a low fat diet and takes LIPITOR 20mg /d, off NIACIN, Fish Oil, Flax Seed Oil, & CoQ10... ~  [place] 9/08 showed TChol 110, TG 82, HDL 28, LDL 66 ~  FLP 8/09 showed TChol 177, TG 130, HDL 36, LDL 115 ~  FLP 1/10 showed TChol 114, TG 109, HDL 31, LDL 62 ~  FLP 2/11 showed TChol 103, TG 85, HDL 36, LDL 51 ~  FLP 2/12 showed TChol 101, TG 121, HDL 26, LDL 51 ~  FLP 2/13 on Cres10+Niacin500 showed TChol 124, TG 167, HDL 33, LDL 57  HIATAL HERNIA, Hx of ESOPHAGEAL STRICTURE, OTHER SPECIFIED DISORDER OF STOMACH AND DUODENUM (ICD-537.89) >> he gets the [facility], along w/ the similar product from [employer]- "I read too much"- he stopped PPI/ Dexilant Rx in favor of "Vinegar & Honey- it works as well per the [employer]"...  ~  he had an EGD 6/08 showing a 3 cm HH & distal esoph stricture dilated;  Rx Pepcid 20mg /d... ~  EGD 09/02/08 by DrPerry showed a HH, schatzki ring, 1cm submucosal nodule in antrum, 2cm sessile polyp in duod. 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 ~  f/u DrPatterson 6/10- continue KAPIDEX & reflux regimen... ~  1/12:  he is overdue for f/u w/ GI & we will request appt. ~  11/12: he saw DrJacobs for f/u eval & had EGD12/12 showing Schatzki's ring, sessile duod polyp (Bx=tubular adenoma), unchanged submucosal nodule... ~  8/13:  He lists PROTONIX 40mg /d on his current med list... ~  1/16:  EGD by DrJacobs showed a schatzki's ring above a 3cm HH, antral subepith lesion unchanged from 3 yrs ago, 48mm polypoid mucosa in duodenum (path= duod adenoma)- they plan repeat in 3 yrs...  Hx of Hyperplastic COLONIC POLYP in 1999 - all followed by  DrPatterson;  ~  his last colonoscopy was 8/04 and normal without recurrent polyps... ~  f/u colonoscopy 8/09 by DrPatterson showed 77mm polyp, bx= hyperplastic, f/u planned 62yrs. ~  1/16:  Colonoscopy by DrJacobs was wnl & f/u rec in 10 yrs.  Hx Elevated LFT's - routine CPX 12/08 w/ abn LFTs- this was felt to be secondary to his Statin therapy... he was told to stop the Statin  and f/u labs in one month... he requested a liver evaluation from DrPatterson and he was seen 12/09 at which time DrPatterson rechecked his LFT's and they were all back to normal... he also checked the following: ~  AbdSonar w/ echodense liver, no lesions or GB problems... ~  Viral Hepatitis serologies were all negative, including HepC << 8/12: CDC came out w/ new rec that all Boomers should be checked for HepC. ~  A1AT level was normal ~  Ceruloplasmin level was normal ~  ANA was neg, and Antimitochondrial AB/ Anti Smooth Muscle Ab were neg as well... ~  Serum Fe was normal & B12/Folate levels were normal as well... ~  2/12 NOTE:  LFTs have remained WNL & he is on Cres10.  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 ~  1/12: he reports Rx by DrTannenbaum w/ ANDROGEL 1.62% pump- use under each arm daily; Testos level 12/11= 575 on Rx.  OSTEOARTHRITIS, Hx of BACK PAIN - as noted he feels he must take the Celebrex daily as his pain (esp R knee) is much worse when he doesn't take it;  he sees DrOlin and had a RKnee arthroscopy 9/07> he uses Glucosamine, MVI, Vit D & Folic... ~  7/10:  he requests Rx for Celebrex 100mg  #90- take 1 cap daily as needed (advised just Prn use). ~  8/12:  Pt requests generic substitute for Celebrex (try RELAFEN 500mg ) & referral to DrDeveshwar for  Rheumatology eval... ~  12/12:  Seen by DrDeveshwar for his OA, left shoulder pain w/ rotator cuff tear per DrCollins, tried shot; he has OA in hands and feet; rec Tramadol vs Relafen Rx.. ~  02/2016:  [name] had R-TKR by DrOlin, c/o lots of pain/ discomfort after surg & difficulty getting around....  Skin Cancers - he is followed by Dr Jarome Matin w/ [facility] Cream skin ca therapy- skin peel that attacks cancer cells.  [place] given age 46 (2011);  he get the yearly seasonal Flu vaccine every fall;  he had a shingles vaccine as well;  had tetanus shot 2005 w/ trip to [place];  up-to-date on colonoscopy done 2009; prostate checks by DrTannenbaum yearly...   Past Surgical History:  Procedure Laterality Date  . CARDIAC CATHETERIZATION     '05, last 2009, showing patent RCA stent  . COLONOSCOPY  12/2007   HYPERPLASTIC POLYP  . COLONOSCOPY WITH PROPOFOL N/A 06/02/2014   Procedure: COLONOSCOPY WITH PROPOFOL;  Surgeon: [name], MD;  Location: [facility];  Service: Endoscopy;  Laterality: N/A;  . CORONARY ANGIOPLASTY  08/2003  . ESOPHAGOGASTRODUODENOSCOPY (EGD) WITH PROPOFOL N/A 06/02/2014   Procedure: ESOPHAGOGASTRODUODENOSCOPY (EGD) WITH PROPOFOL;  Surgeon: [name], MD;  Location: [facility];  Service: Endoscopy;  Laterality: N/A;  . hernia surgery x 3     Bilateral Inguinal, Umbicial  . IRRIGATION AND DEBRIDEMENT ABSCESS Left 08/14/2012   Procedure: IRRIGATION AND DEBRIDEMENT LEFT INGUINAL BOIL ;  Surgeon: Ailene Rud, MD;  Location: [facility] ORS;  Service: Urology;  Laterality: Left;  . LEFT HEART CATHETERIZATION WITH CORONARY ANGIOGRAM N/A 05/26/2014   Procedure: LEFT HEART CATHETERIZATION WITH CORONARY ANGIOGRAM;  Surgeon: Blane Ohara, MD;  Location: [facility] CATH LAB;  Service: Cardiovascular;  Laterality: N/A;  .  LUMBAR LAMINECTOMY/DECOMPRESSION MICRODISCECTOMY Left 09/26/2014   Procedure: Lumbar Laminectomy for resection of synovial cyst  Lumbar five- sacral  one left;  Surgeon: Kary Kos, MD;  Location: [place] [facility] NEURO ORS;  Service: Neurosurgery;  Laterality: Left;  . Oral surg to removed growth from ?sinus  10/2010   ?Dermoid removed by DrRiggs  . RCA stenting     '05 RCA  . right toe surgery  Right    Cyst   . s/p right knee arthroscopy  2005  . TOTAL KNEE ARTHROPLASTY Right 03/12/2016   Procedure: RIGHT TOTAL KNEE ARTHROPLASTY;  Surgeon: Paralee Cancel, MD;  Location: [facility];  Service: Orthopedics;  Laterality: Right;  . UPPER GASTROINTESTINAL ENDOSCOPY      Outpatient Encounter Prescriptions as of 05/27/2016  Medication Sig Dispense Refill  . ANDROGEL PUMP 20.25 MG/ACT (1.62%) GEL Apply 2 Act topically daily. On each shoulder    . atorvastatin (LIPITOR) 20 MG tablet Take 1 tablet (20 mg total) by mouth daily at 6 PM. 90 tablet 3  . b complex vitamins tablet Take 1 tablet by mouth daily.     . carvedilol (COREG) 12.5 MG tablet Take 1 tablet (12.5 mg total) by mouth 2 (two) times daily with a meal. 180 tablet 3  . Coenzyme Q10 (CO Q 10) 100 MG CAPS Take 1 capsule by mouth daily.     Marland Kitchen docusate sodium (COLACE) 100 MG capsule Take 1 capsule (100 mg total) by mouth 2 (two) times daily. 10 capsule 0  . ferrous sulfate 325 (65 FE) MG tablet Take 1 tablet (325 mg total) by mouth 3 (three) times daily after meals.  3  . fish oil-omega-3 fatty acids 1000 MG capsule Take 1 g by mouth daily.     . folic acid (FOLVITE) A999333 MCG tablet Take 400 mcg by mouth daily.     . Glucosamine-Chondroitin 1500-1200 MG/30ML LIQD Take 1 tablet by mouth daily.     Marland Kitchen HYDROcodone-acetaminophen (NORCO) 7.5-325 MG tablet Take 1-2 tablets by mouth every 4 (four) hours as needed for moderate pain. 60 tablet 0  . hydroxypropyl methylcellulose / hypromellose (ISOPTO TEARS / GONIOVISC) 2.5 % ophthalmic solution Place 1 drop into both eyes daily as needed for dry eyes.    Marland Kitchen losartan (COZAAR) 100 MG tablet Take 1 tablet (100 mg total) by mouth daily. 90 tablet 3  . MAGNESIUM PO Take 1  tablet by mouth every other day.     . methocarbamol (ROBAXIN) 500 MG tablet Take 1 tablet (500 mg total) by mouth every 6 (six) hours as needed for muscle spasms. 40 tablet 0  . Multiple Vitamin (MULTIVITAMIN) capsule Take 1 capsule by mouth daily.      . multivitamin-lutein (OCUVITE-LUTEIN) CAPS capsule Take 1 capsule by mouth daily.    . nabumetone (RELAFEN) 500 MG tablet Take 1 tablet (500 mg total) by mouth 2 (two) times daily. 180 tablet 0  . NON FORMULARY CPAP machine    . pantoprazole (PROTONIX) 40 MG tablet TAKE 1 TABLET BY MOUTH EVERY DAY BEFORE BREAKFAST 90 tablet 3  . polyethylene glycol (MIRALAX / GLYCOLAX) packet Take 17 g by mouth 2 (two) times daily. 14 each 0  . POTASSIUM PO Take 1 tablet by mouth every other day.     . Probiotic Product (PROBIOTIC DAILY PO) Take 1 capsule by mouth daily.     . tamsulosin (FLOMAX) 0.4 MG CAPS capsule Take 0.4 mg by mouth at bedtime.    . traMADol (ULTRAM) 50 MG tablet  Take 1 tablet (50 mg total) by mouth 3 (three) times daily as needed. 270 tablet 0  . TURMERIC PO Take 1 tablet by mouth daily.      No facility-administered encounter medications on file as of 05/27/2016.     Allergies  Allergen Reactions  . Sulfamethoxazole-Trimethoprim Rash       . Sulfonamide Derivatives Rash         Current Medications, Allergies, Past Medical History, Past Surgical History, Family History, and Social History were reviewed in [employer] record.     Review of Systems       See HPI - all other systems neg except as noted...       The patient complains of dyspnea on exertion.  The patient denies anorexia, fever, weight loss, weight gain, vision loss, decreased hearing, hoarseness, chest pain, syncope, peripheral edema, prolonged cough, headaches, hemoptysis, abdominal pain, melena, hematochezia, severe indigestion/heartburn, hematuria, incontinence, muscle weakness, suspicious skin lesions, transient blindness, difficulty walking,  depression, unusual weight change, abnormal bleeding, enlarged lymph nodes, and angioedema.     Objective:   Physical Exam      WD, WN, 71 y/o WM in NAD... GENERAL:  Alert & oriented; pleasant & cooperative. HEENT:  /AT, EOM-wnl, PERRLA, EACs-clear, TMs-wnl, NOSE-clear, THROAT-clear & cough w/ bl-streaked sput NECK:  Supple w/ fairROM; no JVD; normal carotid impulses w/o bruits; no thyromegaly or nodules palpated; no lymphadenopathy. CHEST:  Clear to P & A; without wheezes/ rales/ or rhonchi. HEART:  Regular Rhythm; without murmurs/ rubs/ or gallops. ABDOMEN:  Soft & nontender; normal bowel sounds; no organomegaly or masses detected. EXT: s/p R-TKR w/ pain/ swelling/ decr ROM etc; venous insuffic/ edema/ but Dopplers neg for DVT +Bakers cyst... NEURO:  CN's intact; motor testing normal; sensory testing normal; gait normal & balance OK. DERM:  No lesions noted; no rash etc...  RADIOLOGY DATA:  Reviewed in the EPIC EMR & discussed w/ the patient...  LABORATORY DATA:  Reviewed in the EPIC EMR & discussed w/ the patient...   Assessment & Plan:    HEMOPTYSIS & prob CAP>> eval is discordant & we discussed Rx w/ Levaquin, Pred taper, Hycodan, short-term ROV recheck...   OSA>  He uses his CPAP compliantly w/o problems or daytime hypersomnolence...  HBP>  Controlled on Coreg & Losartan; continue same...  CAD>  followed by DrCooper & most recent [facility] w/ reduced LVF, Cath 1/16- his notes are reviewed...  HYPERLIPID>  Stable on Lip20, Fish Oil, etc; needs better low fat diet, he has DrCooper check his labs...  GI> HH, Hx stricture, s/p removal of duod adenoma>  Followed by DrJacobs...  GI> Hx colon polyps>  F/u EGD & colon done 1/16 by DrJacobs...  GU> BPH, Low-T, ED>  Followed & managed by DrTannenbaum on Androgel & Flomax  DJD- knees and LBP>  He has seen DrDeveshwar for Rheum, DrOlin for Ortho  & DrCram for NS... ~  02/2016:  He had R-TKR w/ painful recoup period;  Ven dopplers neg for DVT but + for Baker's cyst...    Patient's Medications  New Prescriptions   HYDROCODONE-HOMATROPINE (HYCODAN) 5-1.5 MG/5ML SYRUP    Take 5 mLs by mouth every 6 (six) hours as needed for cough.   LEVOFLOXACIN (LEVAQUIN) 500 MG TABLET    Take 1 tablet (500 mg total) by mouth daily.   PREDNISONE (DELTASONE) 20 MG TABLET    Take as directed  Previous Medications   ANDROGEL PUMP 20.25 MG/ACT (1.62%) GEL  Apply 2 Act topically daily. On each shoulder   ATORVASTATIN (LIPITOR) 20 MG TABLET    Take 1 tablet (20 mg total) by mouth daily at 6 PM.   B COMPLEX VITAMINS TABLET    Take 1 tablet by mouth daily.    CARVEDILOL (COREG) 12.5 MG TABLET    Take 1 tablet (12.5 mg total) by mouth 2 (two) times daily with a meal.   COENZYME Q10 (CO Q 10) 100 MG CAPS    Take 1 capsule by mouth daily.    DOCUSATE SODIUM (COLACE) 100 MG CAPSULE    Take 1 capsule (100 mg total) by mouth 2 (two) times daily.   FERROUS SULFATE 325 (65 FE) MG TABLET    Take 1 tablet (325 mg total) by mouth 3 (three) times daily after meals.   FISH OIL-OMEGA-3 FATTY ACIDS 1000 MG CAPSULE    Take 1 g by mouth daily.    FOLIC ACID (FOLVITE) A999333 MCG TABLET    Take 400 mcg by mouth daily.    GLUCOSAMINE-CHONDROITIN 1500-1200 MG/30ML LIQD    Take 1 tablet by mouth daily.    HYDROCODONE-ACETAMINOPHEN (NORCO) 7.5-325 MG TABLET    Take 1-2 tablets by mouth every 4 (four) hours as needed for moderate pain.   HYDROXYPROPYL METHYLCELLULOSE / HYPROMELLOSE (ISOPTO TEARS / GONIOVISC) 2.5 % OPHTHALMIC SOLUTION    Place 1 drop into both eyes daily as needed for dry eyes.   LOSARTAN (COZAAR) 100 MG TABLET    Take 1 tablet (100 mg total) by mouth daily.   MAGNESIUM PO    Take 1 tablet by mouth every other day.    METHOCARBAMOL (ROBAXIN) 500 MG TABLET    Take 1 tablet (500 mg total) by mouth every 6 (six) hours as needed for muscle spasms.   MULTIPLE VITAMIN (MULTIVITAMIN) CAPSULE    Take 1 capsule by mouth daily.      MULTIVITAMIN-LUTEIN (OCUVITE-LUTEIN) CAPS CAPSULE    Take 1 capsule by mouth daily.   NABUMETONE (RELAFEN) 500 MG TABLET    Take 1 tablet (500 mg total) by mouth 2 (two) times daily.   NON FORMULARY    CPAP machine   PANTOPRAZOLE (PROTONIX) 40 MG TABLET    TAKE 1 TABLET BY MOUTH EVERY DAY BEFORE BREAKFAST   POLYETHYLENE GLYCOL (MIRALAX / GLYCOLAX) PACKET    Take 17 g by mouth 2 (two) times daily.   POTASSIUM PO    Take 1 tablet by mouth every other day.    PROBIOTIC PRODUCT (PROBIOTIC DAILY PO)    Take 1 capsule by mouth daily.    TAMSULOSIN (FLOMAX) 0.4 MG CAPS CAPSULE    Take 0.4 mg by mouth at bedtime.   TRAMADOL (ULTRAM) 50 MG TABLET    Take 1 tablet (50 mg total) by mouth 3 (three) times daily as needed.   TURMERIC PO    Take 1 tablet by mouth daily.   Modified Medications   No medications on file  Discontinued Medications   AZITHROMYCIN (ZITHROMAX) 250 MG TABLET    Take 250 mg by mouth daily.   DOXYCYCLINE (VIBRA-TABS) 100 MG TABLET    Take 1 tablet (100 mg total) by mouth 2 (two) times daily.

## 2016-06-14 ENCOUNTER — Other Ambulatory Visit: Payer: Self-pay | Admitting: *Deleted

## 2016-06-14 MED ORDER — TRAMADOL HCL 50 MG PO TABS: 50.0000 mg | ORAL_TABLET | Freq: Three times a day (TID) | ORAL | 0 refills | Status: DC | PRN

## 2016-06-14 NOTE — Telephone Encounter (Signed)
Last Visit: 04/18/16 Next Visit: 10/16/16 UDS: 05/03/15 Narc Agreement: 05/02/15  Patient coming Monday to update narcotic agreement and UDS.  Okay to refill Tramadol?

## 2016-06-17 ENCOUNTER — Other Ambulatory Visit: Payer: Self-pay | Admitting: *Deleted

## 2016-06-17 DIAGNOSIS — Z5181 Encounter for therapeutic drug level monitoring: Secondary | ICD-10-CM | POA: Diagnosis not present

## 2016-06-18 LAB — PAIN MGMT, PROFILE 5 W/CONF, U
AMPHETAMINES: NEGATIVE ng/mL (ref <500)
Barbiturates: NEGATIVE ng/mL (ref <300)
Benzodiazepines: NEGATIVE ng/mL (ref <100)
COCAINE METABOLITE: NEGATIVE ng/mL (ref <150)
Creatinine: 53 mg/dL (ref >=20.0)
MARIJUANA METABOLITE: NEGATIVE ng/mL (ref <20)
Methadone Metabolite: NEGATIVE ng/mL (ref <100)
Opiates: NEGATIVE ng/mL (ref <100)
Oxidant: NEGATIVE ug/mL (ref <200)
Oxycodone: NEGATIVE ng/mL (ref <100)
pH: 6.3 (ref 4.5–9.0)

## 2016-06-18 NOTE — Progress Notes (Signed)
Consistent with treatment

## 2016-06-19 ENCOUNTER — Ambulatory Visit (INDEPENDENT_AMBULATORY_CARE_PROVIDER_SITE_OTHER): Payer: Medicare Other | Admitting: Pulmonary Disease

## 2016-06-19 ENCOUNTER — Ambulatory Visit (INDEPENDENT_AMBULATORY_CARE_PROVIDER_SITE_OTHER)
Admission: RE | Admit: 2016-06-19 | Discharge: 2016-06-19 | Disposition: A | Discharge status: Home / Self Care | Payer: Medicare Other | Source: Ambulatory Visit | Attending: Pulmonary Disease | Admitting: Pulmonary Disease

## 2016-06-19 ENCOUNTER — Other Ambulatory Visit (INDEPENDENT_AMBULATORY_CARE_PROVIDER_SITE_OTHER): Payer: Medicare Other

## 2016-06-19 VITALS — BP 140/82 | HR 84 | Temp 97.3°F | Ht 67.0 in | Wt 190.4 lb

## 2016-06-19 DIAGNOSIS — M159 Polyosteoarthritis, unspecified: Secondary | ICD-10-CM

## 2016-06-19 DIAGNOSIS — I2510 Atherosclerotic heart disease of native coronary artery without angina pectoris: Secondary | ICD-10-CM | POA: Diagnosis not present

## 2016-06-19 DIAGNOSIS — R0609 Other forms of dyspnea: Secondary | ICD-10-CM

## 2016-06-19 DIAGNOSIS — G4733 Obstructive sleep apnea (adult) (pediatric): Secondary | ICD-10-CM

## 2016-06-19 DIAGNOSIS — I10 Essential (primary) hypertension: Secondary | ICD-10-CM

## 2016-06-19 DIAGNOSIS — J181 Lobar pneumonia, unspecified organism: Secondary | ICD-10-CM

## 2016-06-19 DIAGNOSIS — M150 Primary generalized (osteo)arthritis: Secondary | ICD-10-CM

## 2016-06-19 DIAGNOSIS — Z96651 Presence of right artificial knee joint: Secondary | ICD-10-CM | POA: Diagnosis not present

## 2016-06-19 DIAGNOSIS — J189 Pneumonia, unspecified organism: Secondary | ICD-10-CM

## 2016-06-19 LAB — BRAIN NATRIURETIC PEPTIDE: PRO B NATRI PEPTIDE: 98 pg/mL (ref 0.0–100.0)

## 2016-06-19 NOTE — Patient Instructions (Addendum)
Today we updated your med list in our EPIC system...    Continue your current medications the same...  Today we rechecked a follow up CXR & did some additional blood work to r/o Sarcoidosis and any fluid in the lungs.    We will contact you w/ the results when available...   Call for any questions...  Let's plan to keep the prev sched appt for f/u in [phone] (about 50mo from now)...

## 2016-06-20 LAB — ANGIOTENSIN CONVERTING ENZYME: Angiotensin-Converting Enzyme: 49 U/L (ref 9–67)

## 2016-06-21 ENCOUNTER — Encounter: Payer: Self-pay | Admitting: Pulmonary Disease

## 2016-06-21 NOTE — Progress Notes (Signed)
Subjective:    Patient ID: [name], male    DOB: 02-May-1946, 71 y.o.   MRN: [id number]  HPI 71 y/o WM here for a follow up visit... he has multiple medical problems as noted below...   ~  SEE PREV EPIC NOTES FOR OLDER DATA >>    ~  June 18, 2010:  He's had a good 32mo> still c/o bilat CTS symtpms in the AM & he will try the wrist splints Qhs...  he was started on AXIRON topical testosterone Rx per DrTannenbaum & improved...  ~  January 07, 2011:  He continues to f/u w/ DrCooper for Cards every 49mo- seen last wk> CAD, s/p stent RCA 2005, LBBB; last cath was 2009 w/ patent RCA stent & mild non-obstructive dis elsewhere; doing well, exercising regularly, no CP/ palpit/ SOB/ edema/ etc; rec to continue ASA/ Plavix,   LABS 2/13:  FLP- elevTG & lowHDL on Cres10;  Chems- wnl;  CBC- wnl;  TSH=1.91;  PSA=2.08;  VitD=58;  UA- clear  LABS 7/13 by DrDeveshwar:  Chems- wnl x BS=109;  CBC- wnl   ~  November 29, 2014:  3 year ROV & [name] returns after a long hiatus, expertly cared for in the interval by DrCooper for Cards, DrCram for NS, DrDeveshwar for Rheum, DrTannenbaum for Urology, and DrCornett for CCS... He tells me that he needs 2 knees, had lumbar lam by DrCram about 48mo ago, and he wants to see ENT due to losing his voice- voice getting weaker assoc w/ dry cough, denies sput, c/o mild SOB "it's my heart"    OSA> stable on CPAP w/ supplies Q74mo from [place]; no issues w/ his machine or interface; rests well, wakes refreshed, no daytime hypersomnolence & no prob w/ alertness...    HBP & CAD, s/p stent in RCA 2005, LBBB, decr EF w/ septal dysynergy, chr dyspnea> followed by DrCooper on ASA/ Plavix/ Coreg12.5Bid/ Cozaar100; BP= 142/72 & he denies CP, palpit, dizzy, SOB, edema; last saw DrCooper 07/2014 & his note is reviewed    CHOL> on Lip20, FishOil, CoQ10; FLP 9/15 showed TChol 125, TG 154, HDL 30, LDL 64; needs better low fat diet...    GI> HH, Hx stricture, antral nodule=adenoma> on Protonix40  followed by Ardis Hughs (see below); colonoscopy 1/16 was neg/ wnl...    GU> BPH, LTOS, Low-T> on Flomax0.4 & Androgel per DrTannenbaum; he relates a story about GU symptoms & extensive work up in their office "doing just fine" he says, incr energy, etc; treated for seminal vesiculitis w/ Doxy in the past; last seen 3/16 and note reviewed...     Ortho> DJD, LBP> prev on Celebrex & switched to Etodolac & now Percocet & Tramadol; he notes that he's been hurting since he was 71 y/o w/ a strong fam hx arthritis; he saw DrDeveshwar for Rheum & DrCram for NS- s/p Lumbar decompression for resection of synovial cyst & large bone spur L5-S1    Derm> Hx skin cancers treated by DrDJones... EXAM showed Afeb, VSS, O2sat=96% on RA;  HEENT- neg, mallampati1;  Chest- clear w/o w/r/r;  Heart- RR gr1/6 SEM S4 w/o rubs;  Abd- soft, nontender, neg;  Ext- neg w/o c/c/e;  Neuro- intact... We reviewed prob list, meds, xrays and labs> see below for updates >> given TDAP & Prevnar-13, he reports that he got the Pneumovax23 at health dept in 2009...  2DEcho 07/22/11 showed mild LVH, & decr LVF w/ EF~30% w/ HK, Gr1 DD, mild MR, mild LAdil, .Marland Kitchen  CXR 08/13/12 showed norm heart size, clear lungs, NAD.Marland KitchenMarland Kitchen   EKG 05/24/14 showed NSR, rate76, LBBB, no acute changes...  CATH [id number] showed patent coronaries and open stent in RCA, nonobstructive dis in LAD, mild to mod LVD (EF=50%) w/ norm LVEDP- medical management.  Spirometry 11/29/14 showed FVC=2.84 (67%), FEV1=2.05 (63%), %1sec=72, mid-flows reduced at 46% predicted; c/w mild airflow obstruction but can't r/o superimposed restriction w/o lung volumes, etc...   LABS 5/16 showed Chems- wnl & CBC- wnl IMP/PLAN>>  A lot to get caught up on after a 3 yr hiatus;  meds refilled, he wants ZPak for prn use, TDAP & Prevnar-13 today, refer to ENT for check due to losing his voice...  ~  December 06, 2015:  Yearly [place] f/u visit>  [name] reports an number of things that have occurred over the past  yr:  1) root canal done yest, on ?Clinda300tid (didn't bring meds or list to todays visit) & reminded to take Probiotic daily + Activia yogurt;  2) has "stress fx" R knee, eval by DrOlin- wearing brace & has cane;  3) Back surg 5/16 by DrCram w/ "cyst" removed;  4) he says XRays and MRIs have shown that he is "eaten up w/ arthritis" and there is nothing else that they can do; he sees Rheum- DrDeveshwar & she will check him before he travels & give him a shot... 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    HBP & CAD, s/p stent in RCA 2005, LBBB, decr EF 35-40% w/ septal dysynergy, chr dyspnea> followed by DrCooper off Plavix, on ASA/ Coreg12.5Bid/ Cozaar100; BP= 128/72 & he denies CP, palpit, dizzy, SOB, edema; last saw DrCooper 6/17 & his note is reviewed- HBP, CAD, s/p PCI to RCA, LBBB, no recurrent ischemia (cath 1/16 w/ patent stent) & he has chr dyspnea; lim in exerc by his knee prob; he worries a lot about statin related side effects.    CHOL> on Lip20, FishOil, CoQ10; FLP 10/16 showed TChol 121, TG 160, HDL 32, LDL 58; needs better low fat diet...    GI> HH, Hx stricture, antral nodule=adenoma> on Protonix40 followed by DrJacobs (see below); EGD 1/16 showed 3cmHH & schatzki's ring, duodenal adenoma resected & a subepithelial antral lesion; colonoscopy 1/16 was neg/ wnl...    GU> BPH, LTOS, Low-T> on Flomax0.4 & Androgel per DrTannenbaum; he relates a story about GU symptoms & extensive work up in their office "doing just fine" he says, incr energy, etc; treated for seminal vesiculitis w/ Doxy in the past; last seen 6/17 and  note reviewed- LTOS & Low-T, on Flomax & Androgel...    Ortho> DJD, LBP> prev on Celebrex & Percocet, switched to Relafen & Tramadol; he notes that he's been hurting since he was 71 y/o w/ a strong fam hx arthritis; he saw DrDeveshwar for Rheum & DrCram for NS- s/p Lumbar decompression for resection of synovial cyst & large bone spur L5-S1    Derm> Hx skin cancers treated by DrDJones... EXAM showed Afeb, VSS, O2sat=96% on RA;  HEENT- neg, mallampati1;  Chest- clear w/o w/r/r;  Heart- RR gr1/6 SEM S4 w/o rubs;  Abd- soft, nontender, neg;  Ext- neg w/o c/c/e;  Neuro- intact...  CXR 12/06/15> heart at upper lim of norm, lungs essentially clear w/ some scarring on left, NAD...  FASTING blood work> he never returned for FASTING labs as requested... IMP/PLAN>>  [name] appears stable w/ his medical care distributed among his specialists- DrCooper, DrJacobs, DrTannenbaum, DrDeveshwar, DrCram; continue present meds...   ~  April 15, 2016:  75mo [place] saw SG 3wks ago w/ nonprod cough, low grade temp~100, weakness; he had R-TKR 03/12/16 by DrOlin & post op rehab has been rough- on musc relaxer & Norco, Celebrex added & using a walker;  Exam was neg- min leg swelling, chest clear, VSS;  He was given a ZPak & checked VenDopplers- neg, no DVT;  He is here for f/u today- still in lots of pain from the knee surg but getting better slowly he says & the ZPak really helped overall;  He notes that he may need his left knee done later! EXAM showed Afeb, VSS, O2sat=97% on RA;  HEENT- neg, mallampati1;  Chest- clear w/o w/r/r;  Heart- RR gr1/6 SEM S4 w/o rubs;  Abd- soft, nontender, neg;  Ext- s/p R-TKR, mild edema, no c/c;  Neuro- intact...  CXR 03/27/16 (independently reviewed by me in the PACS system)>  Borderline heart size w/o edema, mild basilar atx vs scarring, NAD.Marland KitchenMarland Kitchen   Ven Dopplers 03/28/16>  RLE neg for DVT, pos for baker's cyst...  LABS 02/2016 in Epic>  Chems- wnl x BS=130 range;  CBC- mild anemia post op  w/ Hg=11.2, mcv=90;  Blood type= Opos IMP/PLAN>>  [place] is slowly improving- URI resolved, getting better w/ exercise/ actiity/ to start PT etc;  OK 2017 Flu vaccine today & we'll plan ROV ~46mo...  ~  May 27, 2016:  6wk ROV & add-on appt requested for hemoptysis>  [name] says he developed a cough around Christmas week, deep cough, small amt green sput,  Low grade fever w/o c/s, sl incr SOB, no CP etc;  DrByrum called in a ZPak & the sput seemed to clear, then 3-4d ago he noted small amt streaky hemoptysis but SOB was better & still no discomfort noted;  He feels weak, had Cards f/u DrCooper & doing satis- told to call us for f/u appt;      OSA> stable on CPAP w/ supplies Q27mo from [place]; no issues w/ his machine or interface; rests well, wakes refreshed, no daytime hypersomnolence & no prob w/ alertness; he had ENT eval 10/16 by DrByers for hoarseness & losing his voice, exam was NEG, 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    HBP & CAD, s/p stent in RCA 2005, LBBB, decr EF 35-40% w/ septal dysynergy, chr dyspnea> followed by DrCooper off Plavix, on ASA/ Coreg12.5Bid/ Cozaar100; BP= 128/72 & he denies CP, palpit, dizzy, SOB, edema; last saw DrCooper 6/17 & his note is reviewed- HBP, CAD, s/p PCI to RCA, LBBB, no recurrent ischemia (cath 1/16 w/ patent stent) & he has chr dyspnea; lim in exerc by his knee prob; he worries a lot about statin related side effects.    CHOL> on Lip20, FishOil, CoQ10; FLP 7/17 showed TChol 134, TG 153, HDL 32, LDL 72; needs better low fat diet...    GI> HH, Hx stricture, antral nodule=adenoma> on Protonix40 followed by DrJacobs (see below); EGD 1/16 showed 3cmHH & schatzki's ring, duodenal  adenoma resected & a subepithelial antral lesion; colonoscopy 1/16 was neg/ wnl...    GU> BPH, LTOS, Low-T> on Flomax0.4 & Androgel per DrTannenbaum; he relates a story about GU symptoms & extensive work up in their office "doing  just fine" he says, incr energy, etc; treated for seminal vesiculitis w/ Doxy in the past; last seen 6/17 and note reviewed- LTOS & Low-T, on Flomax & Androgel...    Ortho> DJD, LBP> prev on Celebrex & Percocet, switched to [place]; he notes that he's been hurting since he was 71 y/o w/ a strong fam hx arthritis; he saw DrDeveshwar for Rheum, DrOlin for Ortho, & DrCram for NS- s/p Lumbar decompression for resection of synovial cyst & large bone spur L5-S1; s/p R-TKR 10/17 w/ painful recovery...    Derm> Hx skin cancers treated by DrDJones...    EXAM confirms Afeb, VSS w/ BP=128/82, O2sat=96% on RA;  HEENT- neg, mallampati2;  Chest- clear w/o w/r/r;  Heart- RR w/o m/r/g heard;  Abd- soft, nontender, neg;  Ext- s/p R-TKR, decr ROM, VI, tr edema w/o c/c;  Neuro- ok.  CXR 05/27/16 (independently reviewed by me in the PACS system)>  Borderline heart size, mild patchy lingular density & ?vol loss, otherw clear- r/o lingular pneumonia...  CT Angio Chest 05/27/16 (independently reviewed by me in the PACS system)>  NEG for PE, borderline heart size, mild coronary calcif, no adenopathy, perihilar GGO c/w pneumonia, mild atx...   LABS 05/27/16>  D-dimer is pos at 2.32;  Chems- wnl x BS=119;  CBC- wnl w/ Hg=13.6, WBC=7.6K & norm diff;   IMP/PLAN>>  Hemoptysis w/ poss CAP- on ASA, s/p TKR in recovery, and eval is discordant w/ ?low grade fever, normal wbc, pos D-dimer, neg CTA (w/o PE), prev neg VenDoppler;  CXR & CT w/ GGO right perihilar suggesting infection & we decided to treat w/ Levaquin, Pred20mg -4d taper, Hycodan, and short term f/u appt...   ~  June 19, 2016:  3wk ROV & [name] returns having completed the oral Levaquin & Pred taper for his poss lingular pneumonia w/ hemoptysis> he reports much better "90% improved" w/o recurrent blood, cough, sput, etc; still using Hycodan as needed;  He wanted me to know that his brother Jeneen Rinks [birth year]2/16/42 has lung dis- COPD & Sarcoid, and his son Alyson Locket. Valderrama  07/1958 also has Sarcoid (their ancestors are from [place] for >100 yrs)... We decided to check f/u CXR & LABS> see prob list above...    EXAM confirms Afeb, VSS w/ BP=140/82, O2sat=95% on RA;  HEENT- neg, mallampati2;  Chest- clear decr BS w/o w/r/r;  Heart- RR S4 gallop w/o m/r heard;  Abd- soft, nontender, neg;  Ext- s/p R-TKR, decr ROM, VI, tr edema w/o c/c;  Neuro- ok.  CXR 06/19/16 (independently reviewed by me in the PACS system)> norm heart size, min atx in lingula, otherw norm & no focal infiltrate...  LABS 06/19/16>  BNP= 98;  ACE= 49... IMP/PLAN>>  CXR has cleared and BNP/ ACE are wnl;  He continues on CPAP & his cardiac follow up; call for any interval problems...           PROBLEM LIST:  ENT >> some type of ENT/ Oral surg 7/12> referred by his dentist to [facility] w/ surg thru gums to remove a growth in his sinus= ?dermoid...  OBSTRUCTIVE SLEEP APNEA - Hx OSA w/ sleep study 11/02 RDI=18 and desat to 85%; seen by DrClance and on CPAP rx;  he's been using it  regularly, denies daytime hypersomnolence, etc;  He gets machine check & new mask Q80mo thru [employer] & he reports download showed 12,000 hrs use! ~  CXR 3/10 clear w/ min left basilar scarring... ~  Pt gets his CPAP machine checked & masks replaced every 52mo thru [place]... ~  CXR 10/12 showed normal heart size, clear lungs x left base atx, mild degen changes in TSpine...  Episode of [place] 4/10 after episode of BRB hemoptysis> full eval was neg, no recurrence... he had bronchoscopy- neg,  ENT eval DrRosen- neg, noted some persistant cough treated w/ Mucinex DM, Zyrtek, Tussionex...  no recurrence. ~  NOTE> he is an exsmoker: started at ~11, smoked up til ~41, for a 30 yr smoking hx & he quit >30 yrs ago... ~  05/2016>  Another episode of streaky hemoptysis 6wks after R-TKR, in recovery phase, no CP, mild SOB, low grade fever & eval was discordant w/ low grade fever, norm WBC, ?lingular opac on CXR/CT, pos D-dimer but neg  Vendopplers=> we decided to treat w/ Levaquin, Pred taper, Hycodan & short-term ROV...  HBP & CAD-S/P stent in RCA on 2005 - followed regularly by DrCooper on ASA 81mg /d, off PLAVIX,  COREG 12.5mg Bid,  LOSARTAN 100mg /d...  BP= 120/72 today & similar at home> denies HA, fatigue, visual changes, CP, palipit, dizziness, syncope, dyspnea, edema, etc...  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 ~  2/13: he had f/u DrCooper> still c/o fatigue & dyspnea; no angina, BP controlled, BNP=25, EKG showed NSR, rate72, LBBB;  2DEcho & cardiac MRI are planned... ~  2DEcho 3/13 showed decr EF=30% w/ HK, mild LVH, Gr1DD, mild MR ~  3/13:  Cardiac MRI showed mild LVenlargement, mild LAE, no scar or infarct, no ischemia, no regional wall motion abn, but there was abn septal motion & EF=48%  HYPERLIPIDEMIA - he follows a low fat diet and takes LIPITOR 20mg /d, off NIACIN, Fish Oil, Flax Seed Oil, & CoQ10... ~  [place] 9/08 showed TChol 110, TG 82, HDL 28, LDL 66 ~  FLP 8/09 showed TChol 177, TG 130, HDL 36, LDL 115 ~  FLP 1/10 showed TChol 114, TG 109, HDL 31, LDL 62 ~  FLP 2/11 showed TChol 103, TG 85, HDL 36, LDL 51 ~  FLP 2/12 showed TChol 101, TG 121, HDL 26, LDL 51 ~  FLP 2/13 on Cres10+Niacin500 showed TChol 124, TG 167, HDL 33, LDL 57  HIATAL HERNIA, Hx of ESOPHAGEAL STRICTURE, OTHER SPECIFIED DISORDER OF STOMACH AND DUODENUM (ICD-537.89) >> he gets the [facility], along w/ the similar product from [employer]- "I read too much"- he stopped PPI/ Dexilant Rx in favor of  "Vinegar & Honey- it works as well per the [employer]"...  ~  he had an EGD 6/08 showing a 3 cm HH & distal esoph stricture dilated;  Rx Pepcid 20mg /d... ~  EGD 09/02/08 by DrPerry showed a HH, schatzki ring, 1cm submucosal nodule in antrum, 2cm sessile polyp in duod. 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 ~  f/u DrPatterson 6/10- continue KAPIDEX & reflux regimen... ~  1/12:  he is overdue for f/u w/ GI & we will request appt. ~  11/12: he saw DrJacobs for f/u eval & had EGD12/12 showing Schatzki's ring, sessile duod polyp (Bx=tubular adenoma), unchanged submucosal nodule... ~  8/13:  He lists PROTONIX 40mg /d on his current med list... ~  1/16:  EGD by DrJacobs showed a schatzki's ring above a 3cm HH, antral subepith lesion unchanged from 3 yrs ago, 60mm polypoid mucosa in duodenum (path= duod adenoma)- they plan repeat in 3 yrs...  Hx of Hyperplastic COLONIC POLYP in 1999 - all followed by DrPatterson;  ~  his last colonoscopy was 8/04 and normal without recurrent polyps... ~  f/u colonoscopy 8/09 by DrPatterson showed 37mm polyp, bx= hyperplastic, f/u planned 25yrs. ~  1/16:  Colonoscopy by DrJacobs was wnl & f/u rec in 10 yrs.  Hx Elevated LFT's - routine CPX 12/08 w/ abn LFTs- this was felt to be secondary to his Statin therapy... he was told to stop the Statin and f/u labs in one month... he requested a liver evaluation from DrPatterson and he was seen 12/09 at which time DrPatterson rechecked his LFT's and they were all back to normal... he also checked the following: ~  AbdSonar w/ echodense liver, no lesions or GB problems... ~  Viral Hepatitis serologies were all negative, including HepC << 8/12: CDC came out w/ new rec that all Boomers should be checked for HepC. ~  A1AT level was normal ~  Ceruloplasmin level was normal ~  ANA was  neg, and Antimitochondrial AB/ Anti Smooth Muscle Ab were neg as well... ~  Serum Fe was normal & B12/Folate levels were normal as well... ~  2/12 NOTE:  LFTs have remained WNL & he is on Cres10.  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 ~  1/12: he reports Rx by DrTannenbaum w/ ANDROGEL 1.62% pump- use under each arm daily; Testos level 12/11= 575 on Rx.  OSTEOARTHRITIS, Hx of BACK PAIN - as noted he feels he must take the Celebrex daily as his pain (esp R knee) is much worse when he doesn't take it;  he sees DrOlin and had a RKnee arthroscopy 9/07> he uses Glucosamine, MVI, Vit D & Folic... ~  7/10:  he requests Rx for Celebrex 100mg  #90- take 1 cap daily as needed (advised just Prn use). ~  8/12:  Pt requests generic substitute for Celebrex (try RELAFEN 500mg ) & referral to DrDeveshwar for Rheumatology eval... ~  12/12:  Seen by DrDeveshwar for his OA, left shoulder pain w/ rotator cuff tear per DrCollins, tried shot; he has OA in hands and feet; rec Tramadol vs Relafen Rx.. ~  02/2016:  Haniel had R-TKR by DrOlin, c/o lots of pain/ discomfort after surg & difficulty getting around....  Skin Cancers - he is followed by Dr Jarome Matin w/ [facility] Cream skin ca therapy- skin peel that attacks cancer cells.  [place] given age 10 (2011);  he get the yearly seasonal Flu vaccine every fall;  he had a shingles vaccine as  well;  had tetanus shot 2005 w/ trip to [place];  up-to-date on colonoscopy done 2009; prostate checks by DrTannenbaum yearly...   Past Surgical History:  Procedure Laterality Date  . CARDIAC CATHETERIZATION     '05, last 2009, showing patent RCA stent  . COLONOSCOPY  12/2007   HYPERPLASTIC POLYP  . COLONOSCOPY  WITH PROPOFOL N/A 06/02/2014   Procedure: COLONOSCOPY WITH PROPOFOL;  Surgeon: [name], MD;  Location: [facility] END[facility];  Service: Endoscopy;  Laterality: N/A;  . CORONARY ANGIOPLASTY  08/2003  . ESOPHAGOGASTRODUODENOSCOPY (EGD) WITH PROPOFOL N/A 06/02/2014   Procedure: ESOPHAGOGASTRODUODENOSCOPY (EGD) WITH PROPOFOL;  Surgeon: [name], MD;  Location: [facility] END[facility];  Service: Endoscopy;  Laterality: N/A;  . hernia surgery x 3     Bilateral Inguinal, Umbicial  . IRRIGATION AND DEBRIDEMENT ABSCESS Left 08/14/2012   Procedure: IRRIGATION AND DEBRIDEMENT LEFT INGUINAL BOIL ;  Surgeon: Ailene Rud, MD;  Location: [facility] ORS;  Service: Urology;  Laterality: Left;  . LEFT HEART CATHETERIZATION WITH CORONARY ANGIOGRAM N/A 05/26/2014   Procedure: LEFT HEART CATHETERIZATION WITH CORONARY ANGIOGRAM;  Surgeon: Blane Ohara, MD;  Location[facility] CATH LAB;  Service: Cardiovascular;  Laterality: N/A;  . LUMBAR LAMINECTOMY/DECOMPRESSION MICRODISCECTOMY Left 09/26/2014   Procedure: Lumbar Laminectomy for resection of synovial cyst  Lumbar five- sacral one left;  Surgeon: Kary Kos, MD;  Location: [facility] NEURO ORS;  Service: Neurosurgery;  Laterality: Left;  . Oral surg to removed growth from ?sinus  10/2010   ?Dermoid removed by DrRiggs  . RCA stenting     '05 RCA  . right toe surgery  Right    Cyst   . s/p right knee arthroscopy  2005  . TOTAL KNEE ARTHROPLASTY Right 03/12/2016   Procedure: RIGHT TOTAL KNEE ARTHROPLASTY;  Surgeon: Paralee Cancel, MD;  Location: [facility];  Service: Orthopedics;  Laterality: Right;  . UPPER GASTROINTESTINAL ENDOSCOPY      Outpatient Encounter Prescriptions as of 06/19/2016  Medication Sig  . ANDROGEL PUMP 20.25 MG/ACT (1.62%) GEL Apply 2 Act topically daily. On each shoulder  . atorvastatin (LIPITOR) 20 MG tablet Take 1 tablet (20 mg total) by mouth daily at 6 PM.  . b complex vitamins tablet Take 1 tablet by mouth daily.   . carvedilol (COREG) 12.5 MG tablet Take 1 tablet  (12.5 mg total) by mouth 2 (two) times daily with a meal.  . Coenzyme Q10 (CO Q 10) 100 MG CAPS Take 1 capsule by mouth daily.   Marland Kitchen docusate sodium (COLACE) 100 MG capsule Take 1 capsule (100 mg total) by mouth 2 (two) times daily.  . ferrous sulfate 325 (65 FE) MG tablet Take 1 tablet (325 mg total) by mouth 3 (three) times daily after meals.  . fish oil-omega-3 fatty acids 1000 MG capsule Take 1 g by mouth daily.   . folic acid (FOLVITE) A999333 MCG tablet Take 400 mcg by mouth daily.   . Glucosamine-Chondroitin 1500-1200 MG/30ML LIQD Take 1 tablet by mouth daily.   Marland Kitchen HYDROcodone-acetaminophen (NORCO) 7.5-325 MG tablet Take 1-2 tablets by mouth every 4 (four) hours as needed for moderate pain.  Marland Kitchen HYDROcodone-homatropine (HYCODAN) 5-1.5 MG/5ML syrup Take 5 mLs by mouth every 6 (six) hours as needed for cough.  . hydroxypropyl methylcellulose / hypromellose (ISOPTO TEARS / GONIOVISC) 2.5 % ophthalmic solution Place 1 drop into both eyes daily as needed for dry eyes.  Marland Kitchen losartan (COZAAR) 100 MG tablet Take 1 tablet (100 mg total) by mouth daily.  Marland Kitchen MAGNESIUM PO  Take 1 tablet by mouth every other day.   . methocarbamol (ROBAXIN) 500 MG tablet Take 1 tablet (500 mg total) by mouth every 6 (six) hours as needed for muscle spasms.  . Multiple Vitamin (MULTIVITAMIN) capsule Take 1 capsule by mouth daily.    . multivitamin-lutein (OCUVITE-LUTEIN) CAPS capsule Take 1 capsule by mouth daily.  . nabumetone (RELAFEN) 500 MG tablet Take 1 tablet (500 mg total) by mouth 2 (two) times daily.  . NON FORMULARY CPAP machine  . pantoprazole (PROTONIX) 40 MG tablet TAKE 1 TABLET BY MOUTH EVERY DAY BEFORE BREAKFAST  . polyethylene glycol (MIRALAX / GLYCOLAX) packet Take 17 g by mouth 2 (two) times daily.  Marland Kitchen POTASSIUM PO Take 1 tablet by mouth every other day.   . Probiotic Product (PROBIOTIC DAILY PO) Take 1 capsule by mouth daily.   . tamsulosin (FLOMAX) 0.4 MG CAPS capsule Take 0.4 mg by mouth at bedtime.  . traMADol  (ULTRAM) 50 MG tablet Take 1 tablet (50 mg total) by mouth 3 (three) times daily as needed.  . TURMERIC PO Take 1 tablet by mouth daily.   . [DISCONTINUED] levofloxacin (LEVAQUIN) 500 MG tablet Take 1 tablet (500 mg total) by mouth daily. (Patient not taking: Reported on 06/19/2016)  . [DISCONTINUED] predniSONE (DELTASONE) 20 MG tablet Take as directed (Patient not taking: Reported on 06/19/2016)   No facility-administered encounter medications on file as of 06/19/2016.     Allergies  Allergen Reactions  . Sulfamethoxazole-Trimethoprim Rash       . Sulfonamide Derivatives Rash         Current Medications, Allergies, Past Medical History, Past Surgical History, Family History, and Social History were reviewed in [employer] record.     Review of Systems       See HPI - all other systems neg except as noted...       The patient complains of dyspnea on exertion.  The patient denies anorexia, fever, weight loss, weight gain, vision loss, decreased hearing, hoarseness, chest pain, syncope, peripheral edema, prolonged cough, headaches, hemoptysis, abdominal pain, melena, hematochezia, severe indigestion/heartburn, hematuria, incontinence, muscle weakness, suspicious skin lesions, transient blindness, difficulty walking, depression, unusual weight change, abnormal bleeding, enlarged lymph nodes, and angioedema.     Objective:   Physical Exam      WD, WN, 71 y/o WM in NAD... GENERAL:  Alert & oriented; pleasant & cooperative. HEENT:  [place]/AT, EOM-wnl, PERRLA, EACs-clear, TMs-wnl, NOSE-clear, THROAT-clear & cough w/ bl-streaked sput NECK:  Supple w/ fairROM; no JVD; normal carotid impulses w/o bruits; no thyromegaly or nodules palpated; no lymphadenopathy. CHEST:  Clear to P & A; without wheezes/ rales/ or rhonchi. HEART:  Regular Rhythm; without murmurs/ rubs/ or gallops. ABDOMEN:  Soft & nontender; normal bowel sounds; no organomegaly or masses detected. EXT: s/p R-TKR  w/ pain/ swelling/ decr ROM etc; venous insuffic/ edema/ but Dopplers neg for DVT +Bakers cyst... NEURO:  CN's intact; motor testing normal; sensory testing normal; gait normal & balance OK. DERM:  No lesions noted; no rash etc...  RADIOLOGY DATA:  Reviewed in the EPIC EMR & discussed w/ the patient...  LABORATORY DATA:  Reviewed in the EPIC EMR & discussed w/ the patient...   Assessment & Plan:    HEMOPTYSIS & prob CAP>> eval is discordant & we discussed Rx w/ Levaquin, Pred taper, Hycodan, short-term ROV recheck... 06/19/16>   Resolved...   OSA>  He uses his CPAP compliantly w/o problems or daytime hypersomnolence...  HBP>  Controlled on  Coreg & Losartan; continue same...  CAD>  followed by DrCooper & most recent [place] w/ reduced LVF, Cath 1/16- his notes are reviewed...  HYPERLIPID>  Stable on Lip20, Fish Oil, etc; needs better low fat diet, he has DrCooper check his labs...  GI> HH, Hx stricture, s/p removal of duod adenoma>  Followed by DrJacobs...  GI> Hx colon polyps>  F/u EGD & colon done 1/16 by DrJacobs...  GU> BPH, Low-T, ED>  Followed & managed by DrTannenbaum on Androgel & Flomax  DJD- knees and LBP>  He has seen DrDeveshwar for Rheum, DrOlin for Ortho  & DrCram for NS... ~  02/2016:  He had R-TKR w/ painful recoup period; Ven dopplers neg for DVT but + for Baker's cyst...    Patient's Medications  New Prescriptions   No medications on file  Previous Medications   ANDROGEL PUMP 20.25 MG/ACT (1.62%) GEL    Apply 2 Act topically daily. On each shoulder   ATORVASTATIN (LIPITOR) 20 MG TABLET    Take 1 tablet (20 mg total) by mouth daily at 6 PM.   B COMPLEX VITAMINS TABLET    Take 1 tablet by mouth daily.    CARVEDILOL (COREG) 12.5 MG TABLET    Take 1 tablet (12.5 mg total) by mouth 2 (two) times daily with a meal.   COENZYME Q10 (CO Q 10) 100 MG CAPS    Take 1 capsule by mouth daily.    DOCUSATE SODIUM (COLACE) 100 MG CAPSULE    Take 1 capsule (100 mg  total) by mouth 2 (two) times daily.   FERROUS SULFATE 325 (65 FE) MG TABLET    Take 1 tablet (325 mg total) by mouth 3 (three) times daily after meals.   FISH OIL-OMEGA-3 FATTY ACIDS 1000 MG CAPSULE    Take 1 g by mouth daily.    FOLIC ACID (FOLVITE) A999333 MCG TABLET    Take 400 mcg by mouth daily.    GLUCOSAMINE-CHONDROITIN 1500-1200 MG/30ML LIQD    Take 1 tablet by mouth daily.    HYDROCODONE-ACETAMINOPHEN (NORCO) 7.5-325 MG TABLET    Take 1-2 tablets by mouth every 4 (four) hours as needed for moderate pain.   HYDROCODONE-HOMATROPINE (HYCODAN) 5-1.5 MG/5ML SYRUP    Take 5 mLs by mouth every 6 (six) hours as needed for cough.   HYDROXYPROPYL METHYLCELLULOSE / HYPROMELLOSE (ISOPTO TEARS / GONIOVISC) 2.5 % OPHTHALMIC SOLUTION    Place 1 drop into both eyes daily as needed for dry eyes.   LOSARTAN (COZAAR) 100 MG TABLET    Take 1 tablet (100 mg total) by mouth daily.   MAGNESIUM PO    Take 1 tablet by mouth every other day.    METHOCARBAMOL (ROBAXIN) 500 MG TABLET    Take 1 tablet (500 mg total) by mouth every 6 (six) hours as needed for muscle spasms.   MULTIPLE VITAMIN (MULTIVITAMIN) CAPSULE    Take 1 capsule by mouth daily.     MULTIVITAMIN-LUTEIN (OCUVITE-LUTEIN) CAPS CAPSULE    Take 1 capsule by mouth daily.   NABUMETONE (RELAFEN) 500 MG TABLET    Take 1 tablet (500 mg total) by mouth 2 (two) times daily.   NON FORMULARY    CPAP machine   PANTOPRAZOLE (PROTONIX) 40 MG TABLET    TAKE 1 TABLET BY MOUTH EVERY DAY BEFORE BREAKFAST   POLYETHYLENE GLYCOL (MIRALAX / GLYCOLAX) PACKET    Take 17 g by mouth 2 (two) times daily.   POTASSIUM PO    Take  1 tablet by mouth every other day.    PROBIOTIC PRODUCT (PROBIOTIC DAILY PO)    Take 1 capsule by mouth daily.    TAMSULOSIN (FLOMAX) 0.4 MG CAPS CAPSULE    Take 0.4 mg by mouth at bedtime.   TRAMADOL (ULTRAM) 50 MG TABLET    Take 1 tablet (50 mg total) by mouth 3 (three) times daily as needed.   TURMERIC PO    Take 1 tablet by mouth daily.   Modified  Medications   No medications on file  Discontinued Medications   LEVOFLOXACIN (LEVAQUIN) 500 MG TABLET    Take 1 tablet (500 mg total) by mouth daily.   PREDNISONE (DELTASONE) 20 MG TABLET    Take as directed

## 2016-06-25 DIAGNOSIS — D225 Melanocytic nevi of trunk: Secondary | ICD-10-CM | POA: Diagnosis not present

## 2016-06-25 DIAGNOSIS — L812 Freckles: Secondary | ICD-10-CM | POA: Diagnosis not present

## 2016-06-25 DIAGNOSIS — D0471 Carcinoma in situ of skin of right lower limb, including hip: Secondary | ICD-10-CM | POA: Diagnosis not present

## 2016-06-25 DIAGNOSIS — D485 Neoplasm of uncertain behavior of skin: Secondary | ICD-10-CM | POA: Diagnosis not present

## 2016-06-25 DIAGNOSIS — L570 Actinic keratosis: Secondary | ICD-10-CM | POA: Diagnosis not present

## 2016-06-25 DIAGNOSIS — L821 Other seborrheic keratosis: Secondary | ICD-10-CM | POA: Diagnosis not present

## 2016-06-28 ENCOUNTER — Telehealth: Payer: Self-pay | Admitting: *Deleted

## 2016-06-28 NOTE — Telephone Encounter (Signed)
Received a faxed from [facility] needing clarification on a prescription for tramadol. Spoke with Anderson Malta and clarified prescription.

## 2016-07-17 DIAGNOSIS — Z471 Aftercare following joint replacement surgery: Secondary | ICD-10-CM | POA: Diagnosis not present

## 2016-07-17 DIAGNOSIS — Z96651 Presence of right artificial knee joint: Secondary | ICD-10-CM | POA: Diagnosis not present

## 2016-07-31 DIAGNOSIS — E291 Testicular hypofunction: Secondary | ICD-10-CM | POA: Diagnosis not present

## 2016-08-02 ENCOUNTER — Other Ambulatory Visit: Payer: Self-pay | Admitting: Cardiovascular Disease

## 2016-08-02 MED ORDER — CARVEDILOL 12.5 MG PO TABS: 12.5000 mg | ORAL_TABLET | Freq: Two times a day (BID) | ORAL | 2 refills | Status: DC

## 2016-08-02 MED ORDER — LOSARTAN POTASSIUM 100 MG PO TABS: 100.0000 mg | ORAL_TABLET | Freq: Every day | ORAL | 2 refills | Status: DC

## 2016-08-07 DIAGNOSIS — E291 Testicular hypofunction: Secondary | ICD-10-CM | POA: Diagnosis not present

## 2016-08-07 DIAGNOSIS — R351 Nocturia: Secondary | ICD-10-CM | POA: Diagnosis not present

## 2016-08-08 ENCOUNTER — Other Ambulatory Visit: Payer: Self-pay | Admitting: Cardiovascular Disease

## 2016-08-08 MED ORDER — ATORVASTATIN CALCIUM 20 MG PO TABS: 20.0000 mg | ORAL_TABLET | Freq: Every day | ORAL | 2 refills | Status: DC

## 2016-09-17 DIAGNOSIS — H35379 Puckering of macula, unspecified eye: Secondary | ICD-10-CM | POA: Diagnosis not present

## 2016-09-17 DIAGNOSIS — H2513 Age-related nuclear cataract, bilateral: Secondary | ICD-10-CM | POA: Diagnosis not present

## 2016-09-17 DIAGNOSIS — H524 Presbyopia: Secondary | ICD-10-CM | POA: Diagnosis not present

## 2016-09-18 ENCOUNTER — Other Ambulatory Visit: Payer: Self-pay | Admitting: *Deleted

## 2016-09-18 MED ORDER — TRAMADOL HCL 50 MG PO TABS: 50.0000 mg | ORAL_TABLET | Freq: Three times a day (TID) | ORAL | 0 refills | Status: DC | PRN

## 2016-09-18 NOTE — Telephone Encounter (Signed)
Refill request received via fax  Last Visit: 04/18/16  Next Visit: 10/16/16 UDS: 06/17/16 Narc Agreement: 06/17/16  Okay to refill tramadol?

## 2016-09-25 ENCOUNTER — Telehealth: Payer: Self-pay | Admitting: *Deleted

## 2016-09-25 NOTE — Telephone Encounter (Signed)
Prescription for Tramadol was sent to the speciality pharmacy instead of to the mail order pharmacy. Rob the pharmacist verified that we sent the prescription to them and will get it filled and sent to the patient.

## 2016-10-02 DIAGNOSIS — Z96651 Presence of right artificial knee joint: Secondary | ICD-10-CM | POA: Diagnosis not present

## 2016-10-02 DIAGNOSIS — Z471 Aftercare following joint replacement surgery: Secondary | ICD-10-CM | POA: Diagnosis not present

## 2016-10-04 DIAGNOSIS — M5136 Other intervertebral disc degeneration, lumbar region: Secondary | ICD-10-CM | POA: Insufficient documentation

## 2016-10-04 DIAGNOSIS — M19071 Primary osteoarthritis, right ankle and foot: Secondary | ICD-10-CM | POA: Insufficient documentation

## 2016-10-04 DIAGNOSIS — M1712 Unilateral primary osteoarthritis, left knee: Secondary | ICD-10-CM | POA: Insufficient documentation

## 2016-10-04 DIAGNOSIS — M19072 Primary osteoarthritis, left ankle and foot: Secondary | ICD-10-CM

## 2016-10-04 NOTE — Progress Notes (Signed)
Office Visit Note  Patient: [name]             Date of Birth: [birth year]           MRN: [id number]             PCP: [name], MD Referring: [name], MD Visit Date: 10/16/2016 Occupation: @GUAROCC @    Subjective:  Pain hands and knees   History of Present Illness: [name] is a 71 y.o. male with history of osteoarthritis involving multiple joints. He continues to have pain and discomfort in his bilateral hands. He states the pain in his hands moves around to different fingers. He still continues to have discomfort in his right total knee replacement. He has ongoing osteoarthritis discomfort in his left knee joint. He continues to have discomfort in his bilateral feet off and on. He also has lower back pain. He states the tramadol relieves his pain and helps him get through the day. His pain without tramadol on scale of 0-10 is about 6 and with tramadol about 4.  Activities of Daily Living:  Patient reports morning stiffness for 15 minutes.   Patient Denies nocturnal pain.  Difficulty dressing/grooming: Denies Difficulty climbing stairs: Reports Difficulty getting out of chair: Reports Difficulty using hands for taps, buttons, cutlery, and/or writing: Reports   Review of Systems  Constitutional: Negative for fatigue, night sweats and weakness ( ).  HENT: Negative for mouth sores, mouth dryness and nose dryness.   Eyes: Positive for dryness. Negative for redness.  Respiratory: Negative for shortness of breath and difficulty breathing.   Cardiovascular: Negative for chest pain, palpitations, hypertension, irregular heartbeat and swelling in legs/feet.  Gastrointestinal: Negative for constipation and diarrhea.  Endocrine: Negative for increased urination.  Musculoskeletal: Positive for arthralgias, joint pain, myalgias, morning stiffness and myalgias. Negative for joint swelling, muscle weakness and muscle tenderness.  Skin: Negative for color change, rash,  hair loss, nodules/bumps, skin tightness, ulcers and sensitivity to sunlight.  Allergic/Immunologic: Negative for susceptible to infections.  Neurological: Negative for dizziness, fainting, memory loss and night sweats.  Hematological: Negative for swollen glands.  Psychiatric/Behavioral: Negative for depressed mood and sleep disturbance. The patient is not nervous/anxious.     PMFS History:  Patient Active Problem List   Diagnosis Date Noted  . Primary osteoarthritis of both feet 10/04/2016  . Primary osteoarthritis of left knee 10/04/2016  . DDD (degenerative disc disease), lumbar 10/04/2016  . Hemoptysis 05/27/2016  . CAP (community acquired pneumonia) 05/27/2016  . Overweight (BMI 25.0-29.9) 03/14/2016  . 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  . Dyspnea 11/29/2014  . Synovial cyst of lumbar facet joint 09/26/2014  . Exertional angina (HCC)   . Incisional hernia, without obstruction or gangrene 12/06/2013  . Nausea alone 08/27/2013  . Colon cancer screening 08/27/2013  . 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 Benign prostatic hyperplasia with urinary obstruction 06/18/2010  . OTHER SPECIFIED DISORDER OF STOMACH AND DUODENUM 12/16/2008  . BENIGN NEOPLASM OF LIVER AND BILIARY PASSAGES 11/15/2008  . GERD 09/02/2008  . COLONIC POLYPS, HYPERPLASTIC 05/24/2007  . Obstructive sleep apnea 05/24/2007  . ALLERGIC RHINITIS 05/24/2007  . ESOPHAGEAL STRICTURE 05/24/2007  . HIATAL HERNIA 05/24/2007  . Hyperlipidemia 02/06/2007  . Essential hypertension 02/06/2007  . Coronary atherosclerosis 02/06/2007  . Primary osteoarthritis of both hands 02/06/2007  . Backache 02/06/2007    Past Medical History:  Diagnosis Date  . Allergic rhinitis   . Anginal pain ([place]) 05/26/14   chest pain after chasing  dog  . CAD (coronary artery disease)    hx of stent- 2005 RCA  . CHF (congestive heart failure) ([place])   . Chronic back pain    intermittent  . Constipation   . Dizziness   . Dysrhythmia    right bundle  branch block   . Esophageal stricture   . GERD (gastroesophageal reflux disease)   . Hemoptysis   . Hiatal hernia   . History of colonic polyps    hyperplastic  . HTN (hypertension)   . Hyperlipidemia   . Hypertrophy of prostate with urinary obstruction and other lower urinary tract symptoms (LUTS)   . OA (osteoarthritis)   . OSA (obstructive sleep apnea)    cpap- 10   . 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 Pneumonia   . Shortness of breath    with exertion  . Testicular hypofunction     Family History  Problem Relation Age of Onset  . Coronary artery disease Father   . Diabetes Father   . Sudden death Father        due to heart disease  . Heart disease Father   . Heart disease Mother   . Hypertension Unknown   . Stomach cancer Paternal Grandmother   . Colon cancer Neg Hx   . Esophageal cancer Neg Hx    Past Surgical History:  Procedure Laterality Date  . CARDIAC CATHETERIZATION     '05, last 2009, showing patent RCA stent  . COLONOSCOPY  12/2007   HYPERPLASTIC POLYP  . COLONOSCOPY WITH PROPOFOL N/A 06/02/2014   Procedure: COLONOSCOPY WITH PROPOFOL;  Surgeon: [name], MD;  Location: [facility];  Service: Endoscopy;  Laterality: N/A;  . CORONARY ANGIOPLASTY  08/2003  . ESOPHAGOGASTRODUODENOSCOPY (EGD) WITH PROPOFOL N/A 06/02/2014   Procedure: ESOPHAGOGASTRODUODENOSCOPY (EGD) WITH PROPOFOL;  Surgeon: [name], MD;  Location: [facility];  Service: Endoscopy;  Laterality: N/A;  . hernia surgery x 3     Bilateral Inguinal, Umbicial  . IRRIGATION AND DEBRIDEMENT ABSCESS Left 08/14/2012   Procedure: IRRIGATION AND DEBRIDEMENT LEFT INGUINAL BOIL ;  Surgeon: Ailene Rud, MD;  Location: [facility];  Service: Urology;  Laterality: Left;  . LEFT HEART CATHETERIZATION WITH CORONARY ANGIOGRAM N/A 05/26/2014   Procedure: LEFT HEART CATHETERIZATION WITH CORONARY ANGIOGRAM;   Surgeon: Blane Ohara, MD;  Location: [facility] CATH LAB;  Service: Cardiovascular;  Laterality: N/A;  . LUMBAR LAMINECTOMY/DECOMPRESSION MICRODISCECTOMY Left 09/26/2014   Procedure: Lumbar Laminectomy for resection of synovial cyst  Lumbar five- sacral one left;  Surgeon: Kary Kos, MD;  Location: [place] NEURO ORS;  Service: Neurosurgery;  Laterality: Left;  . Oral surg to removed growth from ?sinus  10/2010   ?Dermoid removed by DrRiggs  . RCA stenting     '05 RCA  . right toe surgery  Right    Cyst   . s/p right knee arthroscopy  2005  . TOTAL KNEE ARTHROPLASTY Right 03/12/2016   Procedure: RIGHT TOTAL KNEE ARTHROPLASTY;  Surgeon: Paralee Cancel, MD;  Location: [facility];  Service: Orthopedics;  Laterality: Right;  . UPPER GASTROINTESTINAL ENDOSCOPY     Social History   Social History Narrative  . No narrative on file     Objective: Vital Signs: BP 130/64   Pulse 76   Ht 5\' 8"  (1.727 m)   Wt 188 lb (85.3 kg)   BMI 28.59 kg/m    Physical Exam  Constitutional: He is oriented to person, place, and time. He appears well-developed and well-nourished.  HENT:  Head: Normocephalic and atraumatic.  Eyes: Conjunctivae and EOM are normal. Pupils are equal, round, and reactive to light.  Neck: Normal range of motion. Neck supple.  Cardiovascular: Normal rate, regular rhythm and normal heart sounds.   Pulmonary/Chest: Effort normal and breath sounds normal.  Abdominal: Soft. Bowel sounds are normal.  Neurological: He is alert and oriented to person, place, and time.  Skin: Skin is warm and dry. Capillary refill takes less than 2 seconds.  Psychiatric: He has a normal mood and affect. His behavior is normal.  Nursing note and vitals reviewed.    Musculoskeletal Exam: C-spine and thoracic lumbar spine good range of motion. Shoulder joints elbow joints wrist joints are good range of motion. He has DIP PIP thickening bilaterally. He has subluxation of several DIPs and PIPs in his hands. No  inflammation was noted. Hip joints are good range of motion. His right knee is replaced and some warmth. Left knee joint has crepitus. He has osteoarthritic changes in his PIPs and DIPs of his feet.  CDAI Exam: CDAI Homunculus Exam:   Joint Counts:  CDAI Tender Joint count: 0 CDAI Swollen Joint count: 0     Investigation: No additional findings. Appointment on 06/19/2016  Component Date Value Ref Range Status  . Angiotensin-Converting Enzyme 06/19/2016 49  9 - 67 U/L Final   Comment: ** Please note change in reference range(s). **     . Pro B Natriuretic peptide (BNP) 06/19/2016 98.0  0.0 - 100.0 pg/mL Final  Orders Only on 06/17/2016  Component Date Value Ref Range Status  . Prescribed Drug 1 06/17/2016 Tramadol   Final  . Creatinine 06/17/2016 53.0  > or = 20.0 mg/dL Final  . pH 06/17/2016 6.30  4.5 - 9.0 Final  . Oxidant 06/17/2016 NEGATIVE  <200 mcg/mL Final  . Amphetamines 06/17/2016 NEGATIVE  <500 ng/mL Final  . [facility] Amphetamines 06/17/2016 CONSISTENT   Final  . Barbiturates 06/17/2016 NEGATIVE  <300 ng/mL Final  . [facility] Barbiturates 06/17/2016 CONSISTENT   Final  . Benzodiazepines 06/17/2016 NEGATIVE  <100 ng/mL Final  . [facility] Benzodiazepines 06/17/2016 CONSISTENT   Final  . Marijuana Metabolite 06/17/2016 NEGATIVE  <20 ng/mL Final  . [facility] Marijuana Metab 06/17/2016 CONSISTENT   Final  . Cocaine Metabolite 06/17/2016 NEGATIVE  <150 ng/mL Final  . [facility] Cocaine Metab 06/17/2016 CONSISTENT   Final  . Methadone Metabolite 06/17/2016 NEGATIVE  <100 ng/mL Final  . [facility] Methadone Metab 06/17/2016 CONSISTENT   Final  . Opiates 06/17/2016 NEGATIVE  <100 ng/mL Final  . [facility] Opiates 06/17/2016 CONSISTENT   Final  . Oxycodone 06/17/2016 NEGATIVE  <100 ng/mL Final  . [facility] Oxycodone 06/17/2016 CONSISTENT   Final  . See Note: 06/17/2016     Final   Comment: This drug testing is for medical treatment only.   Analysis was performed as non-forensic  testing and  these results should be used only by healthcare  providers to render diagnosis or treatment, or to  monitor progress of medical conditions.   [facility] comments are:  - present when drug test results may be the result of     metabolism of one or more drugs or when results are     inconsistent with prescribed medication(s) listed.  - may be blank when drug results are consistent with     prescribed medication(s) listed.   For assistance with interpreting these drug results,  please contact a [employer] Toxicology  Specialist: [phone] [place] [phone]), M-F,  8am-6pm EST.   This drug testing is for medical treatment only. Analysis was performed as non-forensic testing and these results should be used only by healthcare providers to render diagnosis or treatment, or to monitor progress of medical conditions.   For assistance with interpreting these dr                          ug results, please contact a [employer] Toxicology Specialist: [phone] [place] [phone]), M-F, 8am-6pm EST.     Appointment on 05/27/2016  Component Date Value Ref Range Status  . D-Dimer, Quant 05/27/2016 2.32* <0.50 mcg/mL FEU Final   Comment:   The D-Dimer test is used frequently to exclude an acute PE or DVT.  In patients with a low to moderate clinical risk assessment and a D-Dimer result <0.50 mcg/mL FEU, the likelihood of a PE or DVT is very low.  However, a thromboembolic event should not be excluded solely on the basis of the D-Dimer level.  Increased levels of D-Dimer are associated with a PE, DVT, DIC, malignancies, inflammation, sepsis, surgery, trauma, pregnancy, and advancing patient age. [Jama 2006 11:295(2): 485-462]   For additional information, please refer to: http://education.quest[facility].com/faq/FAQ149 (This link is being provided for information/ educational purposes only)     . Sodium 05/27/2016 139  135 - 145 mEq/L Final  .  Potassium 05/27/2016 4.2  3.5 - 5.1 mEq/L Final  . Chloride 05/27/2016 105  96 - 112 mEq/L Final  . CO2 05/27/2016 26  19 - 32 mEq/L Final  . Glucose, Bld 05/27/2016 119* 70 - 99 mg/dL Final  . BUN 05/27/2016 20  6 - 23 mg/dL Final  . Creatinine, Ser 05/27/2016 0.83  0.40 - 1.50 mg/dL Final  . Total Bilirubin 05/27/2016 1.1  0.2 - 1.2 mg/dL Final  . Alkaline Phosphatase 05/27/2016 79  39 - 117 U/L Final  . AST 05/27/2016 19  0 - 37 U/L Final  . ALT 05/27/2016 17  0 - 53 U/L Final  . Total Protein 05/27/2016 7.2  6.0 - 8.3 g/dL Final  . Albumin 05/27/2016 4.1  3.5 - 5.2 g/dL Final  . Calcium 05/27/2016 9.9  8.4 - 10.5 mg/dL Final  . GFR 05/27/2016 96.73  >60.00 mL/min Final  . WBC 05/27/2016 7.6  4.0 - 10.5 K/uL Final  . RBC 05/27/2016 4.67  4.22 - 5.81 Mil/uL Final  . Hemoglobin 05/27/2016 13.6  13.0 - 17.0 g/dL Final  . HCT 05/27/2016 39.9  39.0 - 52.0 % Final  . MCV 05/27/2016 85.5  78.0 - 100.0 fl Final  . MCHC 05/27/2016 34.1  30.0 - 36.0 g/dL Final  . RDW 05/27/2016 14.4  11.5 - 15.5 % Final  . Platelets 05/27/2016 343.0  150.0 - 400.0 K/uL Final  . Neutrophils Relative % 05/27/2016 65.9  43.0 - 77.0 % Final  . Lymphocytes Relative 05/27/2016 23.1  12.0 - 46.0 % Final  . Monocytes Relative 05/27/2016 7.4  3.0 - 12.0 % Final  . Eosinophils Relative 05/27/2016 3.0  0.0 - 5.0 % Final  . Basophils Relative 05/27/2016 0.6  0.0 - 3.0 % Final  . Neutro Abs 05/27/2016 5.0  1.4 - 7.7 K/uL Final  . Lymphs Abs 05/27/2016 1.8  0.7 - 4.0 K/uL Final  . Monocytes Absolute 05/27/2016 0.6  0.1 - 1.0 K/uL Final  . Eosinophils Absolute 05/27/2016 0.2  0.0 - 0.7 K/uL  Final  . Basophils Absolute 05/27/2016 0.0  0.0 - 0.1 K/uL Final     Imaging: No results found.  Speciality Comments: No specialty comments available.    Procedures:  No procedures performed Allergies: Sulfamethoxazole-trimethoprim and Sulfonamide derivatives   Assessment / Plan:     Visit Diagnoses: Primary  osteoarthritis of both hands: He has severe osteoarthritis in his hands with DIP PIP subluxation. He is having difficulty using his right hand. I've given him a prescription for ring splint to his right thumb and right index finger.  Primary osteoarthritis of left knee: Chronic pain he states pain is manageable with tramadol and nabumetone combination  History of total knee replacement, right: He continues to have some discomfort. He is discussed with Dr. Alvan Dame who suggested going and again to remove some of the bone spurs but patient is not prepared for that at this point.  Primary osteoarthritis of both feet: He is been using proper fitting shoes  DDD (degenerative disc disease), lumbar: He is chronic pain   Other chronic pain - on Tramadol, UDS 1/18 c/w ,check narcotic use data base. He is also taking chronic anti-inflammatories. Have discouraged to use. He has history of hypertension and CHF. He states he cannot function without taking NSAIDs. He does understand the side effects of these medications.  History of hypertension: Controlled  Physical medical problems are listed as follows:  History of hyperlipidemia  History of coronary artery disease  History of CHF (congestive heart failure)  History of colon polyps  History of sleep apnea  History of BPH  History of gastric polyp  History of gastroesophageal reflux (GERD)    Orders: No orders of the defined types were placed in this encounter.  No orders of the defined types were placed in this encounter.   Face-to-face time spent with patient was 30 minutes. 50% of time was spent in counseling and coordination of care.  Follow-Up Instructions: Return in about 6 months (around 04/18/2017) for Osteoarthritis, DDD.   Bo Merino, MD  Note - This record has been created using Editor, commissioning.  Chart creation errors have been sought, but may not always  have been located. Such creation errors do not reflect on  the  standard of medical care.

## 2016-10-15 ENCOUNTER — Ambulatory Visit: Payer: Medicare Other | Admitting: Pulmonary Disease

## 2016-10-16 ENCOUNTER — Encounter: Payer: Self-pay | Admitting: Rheumatology

## 2016-10-16 ENCOUNTER — Ambulatory Visit (INDEPENDENT_AMBULATORY_CARE_PROVIDER_SITE_OTHER): Payer: Medicare Other | Admitting: Rheumatology

## 2016-10-16 VITALS — BP 130/64 | HR 76 | Ht 68.0 in | Wt 188.0 lb

## 2016-10-16 DIAGNOSIS — M5136 Other intervertebral disc degeneration, lumbar region: Secondary | ICD-10-CM | POA: Diagnosis not present

## 2016-10-16 DIAGNOSIS — M1712 Unilateral primary osteoarthritis, left knee: Secondary | ICD-10-CM

## 2016-10-16 DIAGNOSIS — M19041 Primary osteoarthritis, right hand: Secondary | ICD-10-CM

## 2016-10-16 DIAGNOSIS — Z87438 Personal history of other diseases of male genital organs: Secondary | ICD-10-CM | POA: Diagnosis not present

## 2016-10-16 DIAGNOSIS — Z96651 Presence of right artificial knee joint: Secondary | ICD-10-CM | POA: Diagnosis not present

## 2016-10-16 DIAGNOSIS — Z86010 Personal history of colonic polyps: Secondary | ICD-10-CM | POA: Diagnosis not present

## 2016-10-16 DIAGNOSIS — Z8669 Personal history of other diseases of the nervous system and sense organs: Secondary | ICD-10-CM

## 2016-10-16 DIAGNOSIS — Z5181 Encounter for therapeutic drug level monitoring: Secondary | ICD-10-CM

## 2016-10-16 DIAGNOSIS — M19071 Primary osteoarthritis, right ankle and foot: Secondary | ICD-10-CM | POA: Diagnosis not present

## 2016-10-16 DIAGNOSIS — M19042 Primary osteoarthritis, left hand: Secondary | ICD-10-CM

## 2016-10-16 DIAGNOSIS — Z8639 Personal history of other endocrine, nutritional and metabolic disease: Secondary | ICD-10-CM | POA: Diagnosis not present

## 2016-10-16 DIAGNOSIS — Z8679 Personal history of other diseases of the circulatory system: Secondary | ICD-10-CM | POA: Diagnosis not present

## 2016-10-16 DIAGNOSIS — Z8719 Personal history of other diseases of the digestive system: Secondary | ICD-10-CM | POA: Diagnosis not present

## 2016-10-16 DIAGNOSIS — M19072 Primary osteoarthritis, left ankle and foot: Secondary | ICD-10-CM

## 2016-10-16 DIAGNOSIS — G8929 Other chronic pain: Secondary | ICD-10-CM

## 2016-10-16 DIAGNOSIS — I2510 Atherosclerotic heart disease of native coronary artery without angina pectoris: Secondary | ICD-10-CM

## 2016-10-16 NOTE — Patient Instructions (Signed)
Please get CBC, CMP with GFR in July

## 2016-10-18 DIAGNOSIS — H43392 Other vitreous opacities, left eye: Secondary | ICD-10-CM | POA: Diagnosis not present

## 2016-10-21 ENCOUNTER — Telehealth: Payer: Self-pay | Admitting: Rheumatology

## 2016-10-21 ENCOUNTER — Telehealth (INDEPENDENT_AMBULATORY_CARE_PROVIDER_SITE_OTHER): Payer: Self-pay | Admitting: Radiology

## 2016-10-21 DIAGNOSIS — M150 Primary generalized (osteo)arthritis: Secondary | ICD-10-CM | POA: Diagnosis not present

## 2016-10-21 DIAGNOSIS — M19041 Primary osteoarthritis, right hand: Secondary | ICD-10-CM

## 2016-10-21 DIAGNOSIS — M25542 Pain in joints of left hand: Secondary | ICD-10-CM | POA: Diagnosis not present

## 2016-10-21 DIAGNOSIS — M19042 Primary osteoarthritis, left hand: Principal | ICD-10-CM

## 2016-10-21 DIAGNOSIS — M6281 Muscle weakness (generalized): Secondary | ICD-10-CM | POA: Diagnosis not present

## 2016-10-21 DIAGNOSIS — M25541 Pain in joints of right hand: Secondary | ICD-10-CM | POA: Diagnosis not present

## 2016-10-21 NOTE — Telephone Encounter (Signed)
[name] from [facility] called in regards to [name] prescription for OT.  Patient did not have the prescription with him today and wants to know if we would fax one to them.  Their fax#[phone].  Thank you.

## 2016-10-21 NOTE — Telephone Encounter (Signed)
Please fax orders for PT to [facility] [phone], [facility].  Patient has appointment today.

## 2016-10-22 NOTE — Telephone Encounter (Signed)
Order has been faxed

## 2016-10-23 ENCOUNTER — Other Ambulatory Visit (INDEPENDENT_AMBULATORY_CARE_PROVIDER_SITE_OTHER): Payer: Medicare Other

## 2016-10-23 ENCOUNTER — Ambulatory Visit (INDEPENDENT_AMBULATORY_CARE_PROVIDER_SITE_OTHER): Payer: Medicare Other | Admitting: Pulmonary Disease

## 2016-10-23 ENCOUNTER — Encounter: Payer: Self-pay | Admitting: Pulmonary Disease

## 2016-10-23 ENCOUNTER — Ambulatory Visit (INDEPENDENT_AMBULATORY_CARE_PROVIDER_SITE_OTHER)
Admission: RE | Admit: 2016-10-23 | Discharge: 2016-10-23 | Disposition: A | Discharge status: Home / Self Care | Payer: Medicare Other | Source: Ambulatory Visit | Attending: Pulmonary Disease | Admitting: Pulmonary Disease

## 2016-10-23 VITALS — BP 108/60 | HR 82 | Temp 97.2°F | Ht 67.0 in | Wt 186.0 lb

## 2016-10-23 DIAGNOSIS — R0609 Other forms of dyspnea: Secondary | ICD-10-CM

## 2016-10-23 DIAGNOSIS — I10 Essential (primary) hypertension: Secondary | ICD-10-CM | POA: Diagnosis not present

## 2016-10-23 DIAGNOSIS — R0602 Shortness of breath: Secondary | ICD-10-CM | POA: Diagnosis not present

## 2016-10-23 DIAGNOSIS — Z96651 Presence of right artificial knee joint: Secondary | ICD-10-CM

## 2016-10-23 DIAGNOSIS — G4733 Obstructive sleep apnea (adult) (pediatric): Secondary | ICD-10-CM | POA: Diagnosis not present

## 2016-10-23 DIAGNOSIS — M150 Primary generalized (osteo)arthritis: Secondary | ICD-10-CM | POA: Diagnosis not present

## 2016-10-23 DIAGNOSIS — Z8701 Personal history of pneumonia (recurrent): Secondary | ICD-10-CM | POA: Diagnosis not present

## 2016-10-23 DIAGNOSIS — I2510 Atherosclerotic heart disease of native coronary artery without angina pectoris: Secondary | ICD-10-CM | POA: Diagnosis not present

## 2016-10-23 DIAGNOSIS — R5383 Other fatigue: Secondary | ICD-10-CM | POA: Diagnosis not present

## 2016-10-23 DIAGNOSIS — M179 Osteoarthritis of knee, unspecified: Secondary | ICD-10-CM | POA: Insufficient documentation

## 2016-10-23 DIAGNOSIS — M159 Polyosteoarthritis, unspecified: Secondary | ICD-10-CM

## 2016-10-23 DIAGNOSIS — M1990 Unspecified osteoarthritis, unspecified site: Secondary | ICD-10-CM | POA: Insufficient documentation

## 2016-10-23 LAB — COMPREHENSIVE METABOLIC PANEL
ALBUMIN: 4.4 g/dL (ref 3.5–5.2)
ALT: 22 U/L (ref 0–53)
AST: 22 U/L (ref 0–37)
Alkaline Phosphatase: 78 U/L (ref 39–117)
BUN: 22 mg/dL (ref 6–23)
CALCIUM: 9.7 mg/dL (ref 8.4–10.5)
CHLORIDE: 107 meq/L (ref 96–112)
CO2: 27 meq/L (ref 19–32)
CREATININE: 0.9 mg/dL (ref 0.40–1.50)
GFR: 88 mL/min (ref >60.00)
Glucose, Bld: 111 mg/dL — ABNORMAL HIGH (ref 70–99)
Potassium: 4.2 mEq/L (ref 3.5–5.1)
Sodium: 138 mEq/L (ref 135–145)
Total Bilirubin: 1.1 mg/dL (ref 0.2–1.2)
Total Protein: 6.9 g/dL (ref 6.0–8.3)

## 2016-10-23 LAB — CBC WITH DIFFERENTIAL/PLATELET
BASOS PCT: 0.7 % (ref 0.0–3.0)
Basophils Absolute: 0 10*3/uL (ref 0.0–0.1)
EOS ABS: 0.4 10*3/uL (ref 0.0–0.7)
EOS PCT: 5.5 % — AB (ref 0.0–5.0)
HEMATOCRIT: 43.6 % (ref 39.0–52.0)
HEMOGLOBIN: 14.7 g/dL (ref 13.0–17.0)
LYMPHS PCT: 22.7 % (ref 12.0–46.0)
Lymphs Abs: 1.5 10*3/uL (ref 0.7–4.0)
MCHC: 33.8 g/dL (ref 30.0–36.0)
MCV: 88.8 fl (ref 78.0–100.0)
MONO ABS: 0.9 10*3/uL (ref 0.1–1.0)
Monocytes Relative: 13 % — ABNORMAL HIGH (ref 3.0–12.0)
Neutro Abs: 3.9 10*3/uL (ref 1.4–7.7)
Neutrophils Relative %: 58.1 % (ref 43.0–77.0)
Platelets: 242 10*3/uL (ref 150.0–400.0)
RBC: 4.91 Mil/uL (ref 4.22–5.81)
RDW: 13.6 % (ref 11.5–15.5)
WBC: 6.7 10*3/uL (ref 4.0–10.5)

## 2016-10-23 LAB — TSH: TSH: 3.19 u[IU]/mL (ref 0.35–4.50)

## 2016-10-23 LAB — SEDIMENTATION RATE: SED RATE: 10 mm/h (ref 0–20)

## 2016-10-23 NOTE — Progress Notes (Signed)
Subjective:    Patient ID: [name], male    DOB: 02-May-1946, 71 y.o.   MRN: [id number]  HPI 71 y/o WM here for a follow up visit... he has multiple medical problems as noted below...   ~  SEE PREV EPIC NOTES FOR OLDER DATA >>    ~  June 18, 2010:  He's had a good 32mo> still c/o bilat CTS symtpms in the AM & he will try the wrist splints Qhs...  he was started on AXIRON topical testosterone Rx per DrTannenbaum & improved...  ~  January 07, 2011:  He continues to f/u w/ DrCooper for Cards every 49mo- seen last wk> CAD, s/p stent RCA 2005, LBBB; last cath was 2009 w/ patent RCA stent & mild non-obstructive dis elsewhere; doing well, exercising regularly, no CP/ palpit/ SOB/ edema/ etc; rec to continue ASA/ Plavix,   LABS 2/13:  FLP- elevTG & lowHDL on Cres10;  Chems- wnl;  CBC- wnl;  TSH=1.91;  PSA=2.08;  VitD=58;  UA- clear  LABS 7/13 by DrDeveshwar:  Chems- wnl x BS=109;  CBC- wnl   ~  November 29, 2014:  3 year ROV & [name] returns after a long hiatus, expertly cared for in the interval by DrCooper for Cards, DrCram for NS, DrDeveshwar for Rheum, DrTannenbaum for Urology, and DrCornett for CCS... He tells me that he needs 2 knees, had lumbar lam by DrCram about 48mo ago, and he wants to see ENT due to losing his voice- voice getting weaker assoc w/ dry cough, denies sput, c/o mild SOB "it's my heart"    OSA> stable on CPAP w/ supplies Q74mo from [place]; no issues w/ his machine or interface; rests well, wakes refreshed, no daytime hypersomnolence & no prob w/ alertness...    HBP & CAD, s/p stent in RCA 2005, LBBB, decr EF w/ septal dysynergy, chr dyspnea> followed by DrCooper on ASA/ Plavix/ Coreg12.5Bid/ Cozaar100; BP= 142/72 & he denies CP, palpit, dizzy, SOB, edema; last saw DrCooper 07/2014 & his note is reviewed    CHOL> on Lip20, FishOil, CoQ10; FLP 9/15 showed TChol 125, TG 154, HDL 30, LDL 64; needs better low fat diet...    GI> HH, Hx stricture, antral nodule=adenoma> on Protonix40  followed by Ardis Hughs (see below); colonoscopy 1/16 was neg/ wnl...    GU> BPH, LTOS, Low-T> on Flomax0.4 & Androgel per DrTannenbaum; he relates a story about GU symptoms & extensive work up in their office "doing just fine" he says, incr energy, etc; treated for seminal vesiculitis w/ Doxy in the past; last seen 3/16 and note reviewed...     Ortho> DJD, LBP> prev on Celebrex & switched to Etodolac & now Percocet & Tramadol; he notes that he's been hurting since he was 71 y/o w/ a strong fam hx arthritis; he saw DrDeveshwar for Rheum & DrCram for NS- s/p Lumbar decompression for resection of synovial cyst & large bone spur L5-S1    Derm> Hx skin cancers treated by DrDJones... EXAM showed Afeb, VSS, O2sat=96% on RA;  HEENT- neg, mallampati1;  Chest- clear w/o w/r/r;  Heart- RR gr1/6 SEM S4 w/o rubs;  Abd- soft, nontender, neg;  Ext- neg w/o c/c/e;  Neuro- intact... We reviewed prob list, meds, xrays and labs> see below for updates >> given TDAP & Prevnar-13, he reports that he got the Pneumovax23 at health dept in 2009...  2DEcho 07/22/11 showed mild LVH, & decr LVF w/ EF~30% w/ HK, Gr1 DD, mild MR, mild LAdil, .Marland Kitchen  CXR 08/13/12 showed norm heart size, clear lungs, NAD.Marland KitchenMarland Kitchen   EKG 05/24/14 showed NSR, rate76, LBBB, no acute changes...  CATH [id number] showed patent coronaries and open stent in RCA, nonobstructive dis in LAD, mild to mod LVD (EF=50%) w/ norm LVEDP- medical management.  Spirometry 11/29/14 showed FVC=2.84 (67%), FEV1=2.05 (63%), %1sec=72, mid-flows reduced at 46% predicted; c/w mild airflow obstruction but can't r/o superimposed restriction w/o lung volumes, etc...   LABS 5/16 showed Chems- wnl & CBC- wnl IMP/PLAN>>  A lot to get caught up on after a 3 yr hiatus;  meds refilled, he wants ZPak for prn use, TDAP & Prevnar-13 today, refer to ENT for check due to losing his voice...  ~  December 06, 2015:  Yearly [place] f/u visit>  [name] reports an number of things that have occurred over the past  yr:  1) root canal done yest, on ?Clinda300tid (didn't bring meds or list to todays visit) & reminded to take Probiotic daily + Activia yogurt;  2) has "stress fx" R knee, eval by DrOlin- wearing brace & has cane;  3) Back surg 5/16 by DrCram w/ "cyst" removed;  4) he says XRays and MRIs have shown that he is "eaten up w/ arthritis" and there is nothing else that they can do; he sees Rheum- DrDeveshwar & she will check him before he travels & give him a shot... 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    HBP & CAD, s/p stent in RCA 2005, LBBB, decr EF 35-40% w/ septal dysynergy, chr dyspnea> followed by DrCooper off Plavix, on ASA/ Coreg12.5Bid/ Cozaar100; BP= 128/72 & he denies CP, palpit, dizzy, SOB, edema; last saw DrCooper 6/17 & his note is reviewed- HBP, CAD, s/p PCI to RCA, LBBB, no recurrent ischemia (cath 1/16 w/ patent stent) & he has chr dyspnea; lim in exerc by his knee prob; he worries a lot about statin related side effects.    CHOL> on Lip20, FishOil, CoQ10; FLP 10/16 showed TChol 121, TG 160, HDL 32, LDL 58; needs better low fat diet...    GI> HH, Hx stricture, antral nodule=adenoma> on Protonix40 followed by DrJacobs (see below); EGD 1/16 showed 3cmHH & schatzki's ring, duodenal adenoma resected & a subepithelial antral lesion; colonoscopy 1/16 was neg/ wnl...    GU> BPH, LTOS, Low-T> on Flomax0.4 & Androgel per DrTannenbaum; he relates a story about GU symptoms & extensive work up in their office "doing just fine" he says, incr energy, etc; treated for seminal vesiculitis w/ Doxy in the past; last seen 6/17 and  note reviewed- LTOS & Low-T, on Flomax & Androgel...    Ortho> DJD, LBP> prev on Celebrex & Percocet, switched to Relafen & Tramadol; he notes that he's been hurting since he was 71 y/o w/ a strong fam hx arthritis; he saw DrDeveshwar for Rheum & DrCram for NS- s/p Lumbar decompression for resection of synovial cyst & large bone spur L5-S1    Derm> Hx skin cancers treated by DrDJones... EXAM showed Afeb, VSS, O2sat=96% on RA;  HEENT- neg, mallampati1;  Chest- clear w/o w/r/r;  Heart- RR gr1/6 SEM S4 w/o rubs;  Abd- soft, nontender, neg;  Ext- neg w/o c/c/e;  Neuro- intact...  CXR 12/06/15> heart at upper lim of norm, lungs essentially clear w/ some scarring on left, NAD...  FASTING blood work> he never returned for FASTING labs as requested... IMP/PLAN>>  [name] appears stable w/ his medical care distributed among his specialists- DrCooper, DrJacobs, DrTannenbaum, DrDeveshwar, DrCram; continue present meds...   ~  April 15, 2016:  75mo [place] saw SG 3wks ago w/ nonprod cough, low grade temp~100, weakness; he had R-TKR 03/12/16 by DrOlin & post op rehab has been rough- on musc relaxer & Norco, Celebrex added & using a walker;  Exam was neg- min leg swelling, chest clear, VSS;  He was given a ZPak & checked VenDopplers- neg, no DVT;  He is here for f/u today- still in lots of pain from the knee surg but getting better slowly he says & the ZPak really helped overall;  He notes that he may need his left knee done later! EXAM showed Afeb, VSS, O2sat=97% on RA;  HEENT- neg, mallampati1;  Chest- clear w/o w/r/r;  Heart- RR gr1/6 SEM S4 w/o rubs;  Abd- soft, nontender, neg;  Ext- s/p R-TKR, mild edema, no c/c;  Neuro- intact...  CXR 03/27/16 (independently reviewed by me in the PACS system)>  Borderline heart size w/o edema, mild basilar atx vs scarring, NAD.Marland KitchenMarland Kitchen   Ven Dopplers 03/28/16>  RLE neg for DVT, pos for baker's cyst...  LABS 02/2016 in Epic>  Chems- wnl x BS=130 range;  CBC- mild anemia post op  w/ Hg=11.2, mcv=90;  Blood type= Opos IMP/PLAN>>  [name] is slowly improving- URI resolved, getting better w/ exercise/ actiity/ to start PT etc;  OK 2017 Flu vaccine today & we'll plan ROV ~46mo...  ~  May 27, 2016:  6wk ROV & add-on appt requested for hemoptysis>  [name] says he developed a cough around Christmas week, deep cough, small amt green sput,  Low grade fever w/o c/s, sl incr SOB, no CP etc;  DrByrum called in a ZPak & the sput seemed to clear, then 3-4d ago he noted small amt streaky hemoptysis but SOB was better & still no discomfort noted;  He feels weak, had Cards f/u DrCooper & doing satis- told to call us for f/u appt;      OSA> stable on CPAP w/ supplies Q27mo from [place]; no issues w/ his machine or interface; rests well, wakes refreshed, no daytime hypersomnolence & no prob w/ alertness; he had ENT eval 10/16 by DrByers for hoarseness & losing his voice, exam was NEG, 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    HBP & CAD, s/p stent in RCA 2005, LBBB, decr EF 35-40% w/ septal dysynergy, chr dyspnea> followed by DrCooper off Plavix, on ASA/ Coreg12.5Bid/ Cozaar100; BP= 128/72 & he denies CP, palpit, dizzy, SOB, edema; last saw DrCooper 6/17 & his note is reviewed- HBP, CAD, s/p PCI to RCA, LBBB, no recurrent ischemia (cath 1/16 w/ patent stent) & he has chr dyspnea; lim in exerc by his knee prob; he worries a lot about statin related side effects.    CHOL> on Lip20, FishOil, CoQ10; FLP 7/17 showed TChol 134, TG 153, HDL 32, LDL 72; needs better low fat diet...    GI> HH, Hx stricture, antral nodule=adenoma> on Protonix40 followed by DrJacobs (see below); EGD 1/16 showed 3cmHH & schatzki's ring, duodenal  adenoma resected & a subepithelial antral lesion; colonoscopy 1/16 was neg/ wnl...    GU> BPH, LTOS, Low-T> on Flomax0.4 & Androgel per DrTannenbaum; he relates a story about GU symptoms & extensive work up in their office "doing  just fine" he says, incr energy, etc; treated for seminal vesiculitis w/ Doxy in the past; last seen 6/17 and note reviewed- LTOS & Low-T, on Flomax & Androgel...    Ortho> DJD, LBP> prev on Celebrex & Percocet, switched to [place]; he notes that he's been hurting since he was 71 y/o w/ a strong fam hx arthritis; he saw DrDeveshwar for Rheum, DrOlin for Ortho, & DrCram for NS- s/p Lumbar decompression for resection of synovial cyst & large bone spur L5-S1; s/p R-TKR 10/17 w/ painful recovery...    Derm> Hx skin cancers treated by DrDJones...    EXAM confirms Afeb, VSS w/ BP=128/82, O2sat=96% on RA;  HEENT- neg, mallampati2;  Chest- clear w/o w/r/r;  Heart- RR w/o m/r/g heard;  Abd- soft, nontender, neg;  Ext- s/p R-TKR, decr ROM, VI, tr edema w/o c/c;  Neuro- ok.  CXR 05/27/16 (independently reviewed by me in the PACS system)>  Borderline heart size, mild patchy lingular density & ?vol loss, otherw clear- r/o lingular pneumonia...  CT Angio Chest 05/27/16 (independently reviewed by me in the PACS system)>  NEG for PE, borderline heart size, mild coronary calcif, no adenopathy, perihilar GGO c/w pneumonia, mild atx...   LABS 05/27/16>  D-dimer is pos at 2.32;  Chems- wnl x BS=119;  CBC- wnl w/ Hg=13.6, WBC=7.6K & norm diff;   IMP/PLAN>>  Hemoptysis w/ poss CAP- on ASA, s/p TKR in recovery, and eval is discordant w/ ?low grade fever, normal wbc, pos D-dimer, neg CTA (w/o PE), prev neg VenDoppler;  CXR & CT w/ GGO right perihilar suggesting infection & we decided to treat w/ Levaquin, Pred20mg -4d taper, Hycodan, and short term f/u appt...  ~  June 19, 2016:  3wk ROV & [name] returns having completed the oral Levaquin & Pred taper for his poss lingular pneumonia w/ hemoptysis> he reports much better "90% improved" w/o recurrent blood, cough, sput, etc; still using Hycodan as needed;  He wanted me to know that his brother Billy Rinks Parker2/16/42 has lung dis- COPD & Sarcoid, and his son [name] 07/1958  also has Sarcoid (their ancestors are from [place] for >100 yrs)... We decided to check f/u CXR & LABS> see prob list above...    EXAM confirms Afeb, VSS w/ BP=140/82, O2sat=95% on RA;  HEENT- neg, mallampati2;  Chest- clear decr BS w/o w/r/r;  Heart- RR S4 gallop w/o m/r heard;  Abd- soft, nontender, neg;  Ext- s/p R-TKR, decr ROM, VI, tr edema w/o c/c;  Neuro- ok.  CXR 06/19/16 (independently reviewed by me in the PACS system)> norm heart size, min atx in lingula, otherw norm & no focal infiltrate...  LABS 06/19/16>  BNP= 98;  ACE= 49... IMP/PLAN>>  CXR has cleared and BNP/ ACE are wnl;  He continues on CPAP & his cardiac follow up; call for any interval problems...   ~  October 23, 2016:  36mo ROV & [name] reports that the prev pneumonia resolved, no further cough, sputum, hemoptysis, etc... However [place]'s CC now is SOB/ DOE w/ activity- he notes even min activity produces dyspnea & it has him concerned;  He questions whether this prob is pulmonary or cardiac in origin & we discussed the appropriate eval for each organ system; however it  should be noted that he has been very sedentary for quite some time- since his prev back surg 3 yrs ago, & esp since to his 02/2016 R-TKR surgery; he notes DOE when carrying a bag of soil for yard work, walking into our office from the parking lot, even during sex;  CT ANGIO CHEST was NEG for PE in 05/2016;  He has known HBP, CAD- s/p stent, LBBB, decr EF w/ septal dysynergy followed by DrCooper=> last seen 05/2016> felt to be stable;  CATH 05/2014 showed patent coronaries and open stent in RCA, nonobstructive dis in LAD, mild to mod LVD (EF=50%) w/ norm LVEDP- medical management.  Last 2DEcho 02/2015 showed mild LVH, mod decr LVF w/ EF=35-40%, diffuse HK w/ dyssynchrony, Gr1DD, trivAI, mildMR, mild LA dil at 2mm, RV & PAsys were wnl;  EKG 05/2016 showed NSR, 1st degree AVB, LBBB... Due to his level of concern for his dyspnea we reviewed the need for thorough Pulm & Cardiac  re-assessments => We reviewed the following medical problems during today's office visit >>     DYSPNEA>> I suspect most from deconditioning!  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    HBP & CAD, s/p stent in RCA 2005, LBBB, decr EF 35-40% w/ septal dysynergy, chr dyspnea> followed by DrCooper off Plavix, on ASA/ Coreg12.5Bid/ Cozaar100; BP= 128/72 & he denies CP, palpit, dizzy, SOB, edema; last saw DrCooper 6/17 & his note is reviewed- HBP, CAD, s/p PCI to RCA, LBBB, no recurrent ischemia (cath 1/16 w/ patent stent) & he has chr dyspnea; lim in exerc by his knee prob; he worries a lot about statin related side effects.    CHOL> on Lip20, FishOil, CoQ10; FLP 7/17 showed TChol 134, TG 153, HDL 32, LDL 72; needs better low fat diet...    GI> HH, Hx stricture, antral nodule=adenoma> on Protonix40 followed by DrJacobs (see below); EGD 1/16 showed 3cmHH & schatzki's ring, duodenal adenoma resected & a subepithelial antral lesion; colonoscopy 1/16 was neg/ wnl...    GU> BPH, LTOS, Low-T> on Flomax0.4 & Androgel per DrTannenbaum; he relates a story about GU symptoms & extensive work up in their office "doing just fine" he says, incr energy, etc; treated for seminal vesiculitis w/ Doxy in the past; last seen 6/17 and note reviewed- LTOS & Low-T, on Flomax & Androgel...    Ortho> DJD, LBP> prev on Celebrex & Percocet, switched to [place]; he notes that he's been hurting since he was 71 y/o w/ a strong fam hx arthritis; he saw DrDeveshwar for Rheum, DrOlin for Ortho, & DrCram for NS- s/p Lumbar decompression for  resection of synovial cyst & large bone spur L5-S1; s/p R-TKR 10/17 w/ painful recovery...    Derm> Hx skin cancers treated by DrDJones... EXAM confirms Afeb, VSS w/ BP=110/60, O2sat=95% on RA;  HEENT- neg, mallampati2;  Chest- clear decr BS w/o w/r/r;  Heart- RR S4 gallop w/o m/r heard;  Abd- soft, nontender, neg;  Ext- s/p R-TKR, decr ROM, VI, tr edema w/o c/c;  Neuro- ok...   2DEcho 02/2015 showed  mild LVH, mod decr LVF w/ EF=35-40%, diffuse HK w/ dyssynchrony, Gr1DD, trivAI, mildMR, mild LA dil at 85mm, RV & PAsys were wnl;    EKG 05/2016 showed NSR, 1st degree AVB, LBBB...   CXR 10/23/16>  Stable heart size, clear lungs x for some lingula scarring- NAD...  Spirometry 10/23/16>  FVC=3.20 (83%), FEV1=2.12 (76%), %1sec=66, mid-flows reduced at 54% predicted...  Ambulatory Oximetry on RA>  O2sat=95% on RA at rest w/ pulse=85/min;  He ambulated 3 Laps (185' each) in office w/ lowest O2sat=92% w/ pulse 98/min...   LABS 10/23/16>  Chems- wnl x BS=111;  CBC- wnl;  TSH- 3.19;  Sed- 10 IMP/PLAN>>  I explained to [name] that in my experience that this type of DOE is most often multifactorial- pulm factors do not appear to be playing much of a roll here but I suspect that DECONDITIONING is major factor along w/ his cardiomyopathy;  We reviewed diet & exercise recommendations + need for Cards f/u w/ up-dated 2DEcho, perhaps Cardiac Rehab might be of benefit...           PROBLEM LIST:  ENT >> some type of ENT/ Oral surg 7/12> referred by his dentist to [facility] w/ surg thru gums to remove a growth in his sinus= ?dermoid...  OBSTRUCTIVE SLEEP APNEA - Hx OSA w/ sleep study 11/02 RDI=18 and desat to 85%; seen by DrClance and on CPAP rx;  he's been using it regularly, denies daytime hypersomnolence, etc;  He gets machine check & new mask Q41mo thru [employer] & he reports download showed 12,000 hrs use! ~  CXR 3/10 clear w/ min left basilar scarring... ~  Pt gets his CPAP machine checked & masks replaced every 59mo thru  [place]... ~  CXR 10/12 showed normal heart size, clear lungs x left base atx, mild degen changes in TSpine...  Episode of [place] 4/10 after episode of BRB hemoptysis> full eval was neg, no recurrence... he had bronchoscopy- neg,  ENT eval DrRosen- neg, noted some persistant cough treated w/ Mucinex DM, Zyrtek, Tussionex...  no recurrence. ~  NOTE> he is an exsmoker: started at ~11, smoked up til ~41, for a 30 yr smoking hx & he quit >30 yrs ago... ~  05/2016>  Another episode of streaky hemoptysis 6wks after R-TKR, in recovery phase, no CP, mild SOB, low grade fever & eval was discordant w/ low grade fever, norm WBC, ?lingular opac on CXR/CT, pos D-dimer but neg Vendopplers=> we decided to treat w/ Levaquin, Pred taper, Hycodan & short-term ROV...  HBP & CAD-S/P stent in RCA on 2005 - followed regularly by DrCooper on ASA 81mg /d, off PLAVIX,  COREG 12.5mg Bid,  LOSARTAN 100mg /d...  BP= 120/72 today & similar at home> denies HA, fatigue, visual changes, CP, palipit, dizziness, syncope, dyspnea, edema, etc...  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 ~  2/13: he had f/u DrCooper> still c/o fatigue & dyspnea; no angina, BP controlled, BNP=25, EKG showed NSR, rate72, LBBB;  2DEcho & cardiac MRI are planned... ~  2DEcho 3/13 showed decr  EF=30% w/ HK, mild LVH, Gr1DD, mild MR ~  3/13:  Cardiac MRI showed mild LVenlargement, mild LAE, no scar or infarct, no ischemia, no regional wall motion abn, but there was abn septal motion & EF=48%  HYPERLIPIDEMIA - he follows a low fat  diet and takes LIPITOR 20mg /d, off NIACIN, Fish Oil, Flax Seed Oil, & CoQ10... ~  [place] 9/08 showed TChol 110, TG 82, HDL 28, LDL 66 ~  FLP 8/09 showed TChol 177, TG 130, HDL 36, LDL 115 ~  FLP 1/10 showed TChol 114, TG 109, HDL 31, LDL 62 ~  FLP 2/11 showed TChol 103, TG 85, HDL 36, LDL 51 ~  FLP 2/12 showed TChol 101, TG 121, HDL 26, LDL 51 ~  FLP 2/13 on Cres10+Niacin500 showed TChol 124, TG 167, HDL 33, LDL 57  HIATAL HERNIA, Hx of ESOPHAGEAL STRICTURE, OTHER SPECIFIED DISORDER OF STOMACH AND DUODENUM (ICD-537.89) >> he gets the [facility], along w/ the similar product from [employer]- "I read too much"- he stopped PPI/ Dexilant Rx in favor of "Vinegar & Honey- it works as well per the [employer]"...  ~  he had an EGD 6/08 showing a 3 cm HH & distal esoph stricture dilated;  Rx Pepcid 20mg /d... ~  EGD 09/02/08 by DrPerry showed a HH, schatzki ring, 1cm submucosal nodule in antrum, 2cm sessile polyp in duod. 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 ~  f/u DrPatterson 6/10- continue KAPIDEX & reflux regimen... ~  1/12:  he is overdue for f/u w/ GI & we will request appt. ~  11/12: he saw DrJacobs for f/u eval & had EGD12/12 showing Schatzki's ring, sessile duod polyp (Bx=tubular adenoma), unchanged submucosal nodule... ~  8/13:  He lists PROTONIX 40mg /d on his current med list... ~  1/16:  EGD by DrJacobs showed a schatzki's ring above a 3cm HH, antral subepith lesion unchanged from 3 yrs ago, 75mm polypoid mucosa in duodenum (path= duod adenoma)- they plan repeat in 3 yrs...  Hx of Hyperplastic COLONIC POLYP in 1999 - all followed by DrPatterson;  ~  his last colonoscopy was 8/04 and normal without recurrent polyps... ~  f/u colonoscopy 8/09 by DrPatterson showed 54mm polyp, bx= hyperplastic, f/u planned 69yrs. ~  1/16:   Colonoscopy by DrJacobs was wnl & f/u rec in 10 yrs.  Hx Elevated LFT's - routine CPX 12/08 w/ abn LFTs- this was felt to be secondary to his Statin therapy... he was told to stop the Statin and f/u labs in one month... he requested a liver evaluation from DrPatterson and he was seen 12/09 at which time DrPatterson rechecked his LFT's and they were all back to normal... he also checked the following: ~  AbdSonar w/ echodense liver, no lesions or GB problems... ~  Viral Hepatitis serologies were all negative, including HepC << 8/12: CDC came out w/ new rec that all Boomers should be checked for HepC. ~  A1AT level was normal ~  Ceruloplasmin level was normal ~  ANA was neg, and Antimitochondrial AB/ Anti Smooth Muscle Ab were neg as well... ~  Serum Fe was normal & B12/Folate levels were normal as well... ~  2/12 NOTE:  LFTs have remained WNL & he is on Cres10.  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 ~  1/12: he reports Rx by DrTannenbaum w/ ANDROGEL 1.62% pump- use under each arm daily; Testos level 12/11= 575 on Rx.  OSTEOARTHRITIS, Hx of BACK PAIN - as noted he feels he must take the Celebrex daily as his pain (esp R knee) is much worse when he doesn't take it;  he sees DrOlin and had a RKnee arthroscopy 9/07> he uses Glucosamine, MVI, Vit D & Folic... ~  7/10:  he requests Rx for Celebrex 100mg  #90- take 1 cap daily as needed (advised just Prn use). ~  8/12:  Pt requests generic substitute for Celebrex (try RELAFEN 500mg ) & referral to DrDeveshwar for Rheumatology eval... ~  12/12:  Seen by DrDeveshwar for his OA, left shoulder pain w/ rotator cuff tear per DrCollins, tried shot; he has OA in hands and feet; rec Tramadol vs Relafen Rx.. ~   02/2016:  [name] had R-TKR by DrOlin, c/o lots of pain/ discomfort after surg & difficulty getting around....  Skin Cancers - he is followed by Dr Jarome Matin w/ [facility] Cream skin ca therapy- skin peel that attacks cancer cells.  Ferry given age 23 (2011);  he get the yearly seasonal Flu vaccine every fall;  he had a shingles vaccine as well;  had tetanus shot 2005 w/ trip to [place];  up-to-date on colonoscopy done 2009; prostate checks by DrTannenbaum yearly...   Past Surgical History:  Procedure Laterality Date  . CARDIAC CATHETERIZATION     '05, last 2009, showing patent RCA stent  . COLONOSCOPY  12/2007   HYPERPLASTIC POLYP  . COLONOSCOPY WITH PROPOFOL N/A 06/02/2014   Procedure: COLONOSCOPY WITH PROPOFOL;  Surgeon: [name], MD;  [facility];  Service: Endoscopy;  Laterality: N/A;  . CORONARY ANGIOPLASTY  08/2003  . ESOPHAGOGASTRODUODENOSCOPY (EGD) WITH PROPOFOL N/A 06/02/2014   Procedure: ESOPHAGOGASTRODUODENOSCOPY (EGD) WITH PROPOFOL;  Surgeon: [name], MD;  [facility];  Service: Endoscopy;  Laterality: N/A;  . hernia surgery x 3     Bilateral Inguinal, Umbicial  . IRRIGATION AND DEBRIDEMENT ABSCESS Left 08/14/2012   Procedure: IRRIGATION AND DEBRIDEMENT LEFT INGUINAL BOIL ;  Surgeon: Ailene Rud, MD;  [facility]: [facility];  Service: Urology;  Laterality: Left;  . LEFT HEART CATHETERIZATION WITH CORONARY ANGIOGRAM N/A 05/26/2014   Procedure: LEFT HEART CATHETERIZATION WITH CORONARY ANGIOGRAM;  Surgeon: Blane Ohara, MD;  Location: [facility] CATH LAB;  Service: Cardiovascular;  Laterality: N/A;  . LUMBAR LAMINECTOMY/DECOMPRESSION MICRODISCECTOMY Left 09/26/2014   Procedure: Lumbar Laminectomy for resection of synovial cyst  Lumbar five- sacral one left;  Surgeon: Kary Kos, MD;  Location: [place] [facility];  Service: Neurosurgery;  Laterality: Left;  . Oral surg to removed growth from ?sinus  10/2010   ?Dermoid removed by DrRiggs  . RCA  stenting     '05 RCA  . right toe surgery  Right    Cyst   . s/p right knee arthroscopy  2005  . TOTAL KNEE ARTHROPLASTY Right 03/12/2016   Procedure: RIGHT TOTAL KNEE ARTHROPLASTY;  Surgeon: Paralee Cancel, MD;  [facility];  Service: Orthopedics;  Laterality: Right;  . UPPER GASTROINTESTINAL ENDOSCOPY      Outpatient Encounter Prescriptions as of 10/23/2016  Medication Sig  . ANDROGEL PUMP 20.25 MG/ACT (1.62%) GEL Apply 2 Act topically daily. On each shoulder  .  atorvastatin (LIPITOR) 20 MG tablet Take 1 tablet (20 mg total) by mouth daily at 6 PM.  . b complex vitamins tablet Take 1 tablet by mouth daily.   . carvedilol (COREG) 12.5 MG tablet Take 1 tablet (12.5 mg total) by mouth 2 (two) times daily with a meal.  . Coenzyme Q10 (CO Q 10) 100 MG CAPS Take 1 capsule by mouth daily.   . diclofenac sodium (VOLTAREN) 1 % GEL APPLY 2 GRAMS TO AFFECTED AREA 2 TO 4 TIMES DAILY PRN  . docusate sodium (COLACE) 100 MG capsule Take 1 capsule (100 mg total) by mouth 2 (two) times daily.  . ferrous sulfate 325 (65 FE) MG tablet Take 1 tablet (325 mg total) by mouth 3 (three) times daily after meals.  . fish oil-omega-3 fatty acids 1000 MG capsule Take 1 g by mouth daily.   . folic acid (FOLVITE) 175 MCG tablet Take 400 mcg by mouth daily.   . Glucosamine-Chondroitin 1500-1200 MG/30ML LIQD Take 1 tablet by mouth daily.   . hydroxypropyl methylcellulose / hypromellose (ISOPTO TEARS / GONIOVISC) 2.5 % ophthalmic solution Place 1 drop into both eyes daily as needed for dry eyes.  Marland Kitchen losartan (COZAAR) 100 MG tablet Take 1 tablet (100 mg total) by mouth daily.  Marland Kitchen MAGNESIUM PO Take 1 tablet by mouth every other day.   . Multiple Vitamin (MULTIVITAMIN) capsule Take 1 capsule by mouth daily.    . multivitamin-lutein (OCUVITE-LUTEIN) CAPS capsule Take 1 capsule by mouth daily.  . nabumetone (RELAFEN) 500 MG tablet Take 1 tablet (500 mg total) by mouth 2 (two) times daily.  . NON FORMULARY CPAP machine  .  pantoprazole (PROTONIX) 40 MG tablet TAKE 1 TABLET BY MOUTH EVERY DAY BEFORE BREAKFAST  . polyethylene glycol (MIRALAX / GLYCOLAX) packet Take 17 g by mouth 2 (two) times daily.  Marland Kitchen POTASSIUM PO Take 1 tablet by mouth every other day.   . Probiotic Product (PROBIOTIC DAILY PO) Take 1 capsule by mouth daily.   . tamsulosin (FLOMAX) 0.4 MG CAPS capsule Take 0.4 mg by mouth at bedtime.  . traMADol (ULTRAM) 50 MG tablet Take 1 tablet (50 mg total) by mouth 3 (three) times daily as needed.  . TURMERIC PO Take 1 tablet by mouth daily.    No facility-administered encounter medications on file as of 10/23/2016.     Allergies  Allergen Reactions  . Sulfamethoxazole-Trimethoprim Rash       . Sulfonamide Derivatives Rash         Immunization History  Administered Date(s) Administered  . Influenza Split 03/21/2011  . Influenza Whole 04/06/2009, 04/09/2010  . Influenza, High Dose Seasonal PF 04/15/2016  . Influenza,inj,Quad PF,36+ Mos 03/24/2014  . Influenza-Unspecified 04/20/2015  . Pneumococcal Conjugate-13 11/29/2014  . Tdap 11/29/2014  . Zoster 05/20/2006    Current Medications, Allergies, Past Medical History, Past Surgical History, Family History, and Social History were reviewed in [employer] record.     Review of Systems       See HPI - all other systems neg except as noted...       The patient complains of dyspnea on exertion.  The patient denies anorexia, fever, weight loss, weight gain, vision loss, decreased hearing, hoarseness, chest pain, syncope, peripheral edema, prolonged cough, headaches, hemoptysis, abdominal pain, melena, hematochezia, severe indigestion/heartburn, hematuria, incontinence, muscle weakness, suspicious skin lesions, transient blindness, difficulty walking, depression, unusual weight change, abnormal bleeding, enlarged lymph nodes, and angioedema.     Objective:   Physical  Exam      WD, WN, 71 y/o WM in NAD... GENERAL:  Alert &  oriented; pleasant & cooperative. HEENT:  [place]/AT, EOM-wnl, PERRLA, EACs-clear, TMs-wnl, NOSE-clear, THROAT-clear & cough w/ bl-streaked sput NECK:  Supple w/ fairROM; no JVD; normal carotid impulses w/o bruits; no thyromegaly or nodules palpated; no lymphadenopathy. CHEST:  Clear to P & A; without wheezes/ rales/ or rhonchi. HEART:  Regular Rhythm; without murmurs/ rubs/ or gallops. ABDOMEN:  Soft & nontender; normal bowel sounds; no organomegaly or masses detected. EXT: s/p R-TKR w/ pain/ swelling/ decr ROM etc; venous insuffic/ edema/ but Dopplers neg for DVT +Bakers cyst... NEURO:  CN's intact; motor testing normal; sensory testing normal; gait normal & balance OK. DERM:  No lesions noted; no rash etc...  RADIOLOGY DATA:  Reviewed in the EPIC EMR & discussed w/ the patient...  LABORATORY DATA:  Reviewed in the EPIC EMR & discussed w/ the patient...   Assessment & Plan:    DYSPNEA >> suspect multifactorial w/ deconditioning & cardiomyopathy as major factors...   HEMOPTYSIS & prob CAP Jan2018>> eval is discordant & we discussed Rx w/ Levaquin, Pred taper, Hycodan, short-term ROV recheck... 06/19/16>   Resolved...   OSA>  He uses his CPAP compliantly w/o problems or daytime hypersomnolence...  HBP>  Controlled on Coreg & Losartan; continue same...  CAD>  followed by DrCooper & most recent Marianne w/ reduced LVF, Cath 1/16- his notes are reviewed...  HYPERLIPID>  Stable on Lip20, Fish Oil, etc; needs better low fat diet, he has DrCooper check his labs...  GI> HH, Hx stricture, s/p removal of duod adenoma>  Followed by DrJacobs...  GI> Hx colon polyps>  F/u EGD & colon done 1/16 by DrJacobs...  GU> BPH, Low-T, ED>  Followed & managed by DrTannenbaum on Androgel & Flomax  DJD- knees and LBP>  He has seen DrDeveshwar for Rheum, DrOlin for Ortho  & DrCram for NS... ~  02/2016:  He had R-TKR w/ painful recoup period; Ven dopplers neg for DVT but + for Baker's cyst...     Patient's Medications  New Prescriptions   No medications on file  Previous Medications   ANDROGEL PUMP 20.25 MG/ACT (1.62%) GEL    Apply 2 Act topically daily. On each shoulder   ATORVASTATIN (LIPITOR) 20 MG TABLET    Take 1 tablet (20 mg total) by mouth daily at 6 PM.   B COMPLEX VITAMINS TABLET    Take 1 tablet by mouth daily.    CARVEDILOL (COREG) 12.5 MG TABLET    Take 1 tablet (12.5 mg total) by mouth 2 (two) times daily with a meal.   COENZYME Q10 (CO Q 10) 100 MG CAPS    Take 1 capsule by mouth daily.    DICLOFENAC SODIUM (VOLTAREN) 1 % GEL    APPLY 2 GRAMS TO AFFECTED AREA 2 TO 4 TIMES DAILY PRN   DOCUSATE SODIUM (COLACE) 100 MG CAPSULE    Take 1 capsule (100 mg total) by mouth 2 (two) times daily.   FERROUS SULFATE 325 (65 FE) MG TABLET    Take 1 tablet (325 mg total) by mouth 3 (three) times daily after meals.   FISH OIL-OMEGA-3 FATTY ACIDS 1000 MG CAPSULE    Take 1 g by mouth daily.    FOLIC ACID (FOLVITE) 465 MCG TABLET    Take 400 mcg by mouth daily.    GLUCOSAMINE-CHONDROITIN 1500-1200 MG/30ML LIQD    Take 1 tablet by mouth daily.  HYDROXYPROPYL METHYLCELLULOSE / HYPROMELLOSE (ISOPTO TEARS / GONIOVISC) 2.5 % OPHTHALMIC SOLUTION    Place 1 drop into both eyes daily as needed for dry eyes.   LOSARTAN (COZAAR) 100 MG TABLET    Take 1 tablet (100 mg total) by mouth daily.   MAGNESIUM PO    Take 1 tablet by mouth every other day.    MULTIPLE VITAMIN (MULTIVITAMIN) CAPSULE    Take 1 capsule by mouth daily.     MULTIVITAMIN-LUTEIN (OCUVITE-LUTEIN) CAPS CAPSULE    Take 1 capsule by mouth daily.   NABUMETONE (RELAFEN) 500 MG TABLET    Take 1 tablet (500 mg total) by mouth 2 (two) times daily.   NON FORMULARY    CPAP machine   PANTOPRAZOLE (PROTONIX) 40 MG TABLET    TAKE 1 TABLET BY MOUTH EVERY DAY BEFORE BREAKFAST   POLYETHYLENE GLYCOL (MIRALAX / GLYCOLAX) PACKET    Take 17 g by mouth 2 (two) times daily.   POTASSIUM PO    Take 1 tablet by mouth every other day.    PROBIOTIC  PRODUCT (PROBIOTIC DAILY PO)    Take 1 capsule by mouth daily.    TAMSULOSIN (FLOMAX) 0.4 MG CAPS CAPSULE    Take 0.4 mg by mouth at bedtime.   TRAMADOL (ULTRAM) 50 MG TABLET    Take 1 tablet (50 mg total) by mouth 3 (three) times daily as needed.   TURMERIC PO    Take 1 tablet by mouth daily.   Modified Medications   No medications on file  Discontinued Medications   No medications on file

## 2016-10-23 NOTE — Patient Instructions (Signed)
Today we updated your med list in our EPIC system...    Continue your current medications the same...  We discussed further cardio-pulmonary evaluation for your shortness of breath>>  Today we checked follow up CXR & lab work... We also did a pulmonary function test and a "walk test" to check your oxygenation...    We will contact you w/ the results when available...   We will arrange for a Cardiac functional test w/ DrCooper...  In the meanwhile lets start a gradual exercise program--    Non weight bearing like swimming, water aerobics, exercise bike will be easier on your joints & back...  Call for any questions...  Let's plan a follow up visit in 88mo, sooner if needed for any acute problems.Marland KitchenMarland Kitchen

## 2016-10-24 DIAGNOSIS — N401 Enlarged prostate with lower urinary tract symptoms: Secondary | ICD-10-CM | POA: Diagnosis not present

## 2016-10-24 DIAGNOSIS — E291 Testicular hypofunction: Secondary | ICD-10-CM | POA: Diagnosis not present

## 2016-11-15 DIAGNOSIS — N401 Enlarged prostate with lower urinary tract symptoms: Secondary | ICD-10-CM | POA: Diagnosis not present

## 2016-11-15 DIAGNOSIS — E291 Testicular hypofunction: Secondary | ICD-10-CM | POA: Diagnosis not present

## 2016-11-15 DIAGNOSIS — R351 Nocturia: Secondary | ICD-10-CM | POA: Diagnosis not present

## 2016-11-22 ENCOUNTER — Encounter (INDEPENDENT_AMBULATORY_CARE_PROVIDER_SITE_OTHER): Payer: Medicare Other | Admitting: Ophthalmology

## 2016-11-22 DIAGNOSIS — I10 Essential (primary) hypertension: Secondary | ICD-10-CM | POA: Diagnosis not present

## 2016-11-22 DIAGNOSIS — H35033 Hypertensive retinopathy, bilateral: Secondary | ICD-10-CM | POA: Diagnosis not present

## 2016-11-22 DIAGNOSIS — H43813 Vitreous degeneration, bilateral: Secondary | ICD-10-CM

## 2016-11-27 ENCOUNTER — Ambulatory Visit (INDEPENDENT_AMBULATORY_CARE_PROVIDER_SITE_OTHER): Payer: Medicare Other | Admitting: Cardiovascular Disease

## 2016-11-27 VITALS — BP 146/82 | HR 81 | Ht 67.0 in | Wt 184.0 lb

## 2016-11-27 DIAGNOSIS — R0600 Dyspnea, unspecified: Secondary | ICD-10-CM

## 2016-11-27 DIAGNOSIS — I2510 Atherosclerotic heart disease of native coronary artery without angina pectoris: Secondary | ICD-10-CM

## 2016-11-27 DIAGNOSIS — I5022 Chronic systolic (congestive) heart failure: Secondary | ICD-10-CM | POA: Diagnosis not present

## 2016-11-27 LAB — LIPID PANEL
CHOLESTEROL TOTAL: 138 mg/dL (ref 100–199)
Chol/HDL Ratio: 3.9 ratio (ref 0.0–5.0)
HDL: 35 mg/dL — ABNORMAL LOW (ref >39)
LDL Calculated: 65 mg/dL (ref 0–99)
Triglycerides: 188 mg/dL — ABNORMAL HIGH (ref 0–149)
VLDL CHOLESTEROL CAL: 38 mg/dL (ref 5–40)

## 2016-11-27 LAB — MAGNESIUM: Magnesium: 2.1 mg/dL (ref 1.6–2.3)

## 2016-11-27 LAB — PRO B NATRIURETIC PEPTIDE: NT-PRO BNP: 276 pg/mL (ref 0–376)

## 2016-11-27 NOTE — Patient Instructions (Signed)
Medication Instructions:  Your physician recommends that you continue on your current medications as directed. Please refer to the Current Medication list given to you today.  Labwork: Your physician recommends that you have lab work today: Magnesium, BNP and Lipid  Testing/Procedures: Your physician has requested that you have an echocardiogram. Echocardiography is a painless test that uses sound waves to create images of your heart. It provides your doctor with information about the size and shape of your heart and how well your heart's chambers and valves are working. This procedure takes approximately one hour. There are no restrictions for this procedure.  Your physician has recommended that you have a cardiopulmonary stress test (CPX). CPX testing is a non-invasive measurement of heart and lung function. It replaces a traditional treadmill stress test. This type of test provides a tremendous amount of information that relates not only to your present condition but also for future outcomes. This test combines measurements of you ventilation, respiratory gas exchange in the lungs, electrocardiogram (EKG), blood pressure and physical response before, during, and following an exercise protocol.  Follow-Up: Your physician wants you to follow-up in: 6 MONTHS with Dr Burt Knack. You will receive a reminder letter in the mail two months in advance. If you don't receive a letter, please call our office to schedule the follow-up appointment.   Any Other Special Instructions Will Be Listed Below (If Applicable).     If you need a refill on your cardiac medications before your next appointment, please call your pharmacy.

## 2016-11-27 NOTE — Progress Notes (Signed)
Cardiology Office Note Date:  11/29/2016   ID:  [name], DOB [birth year], MRN [id number]  PCP:  [name], MD  Cardiologist:  [name], MD    Chief Complaint  Patient presents with  . Shortness of Breath     History of Present Illness: [name] is a 71 y.o. male who presents for follow-up of chronic systolic heart failure. The patient has a history of coronary artery disease with stenting of the RCA, left bundle branch block, and cardiomyopathy. He has been followed for chronic dyspnea.  The patient is here alone today. He complains of progressive shortness of breath and fatigue. He has no short of breath with fairly low level activities. He complains of dyspnea with walking short distances, carrying anything heavy, and with sex. He denies orthopnea or PND. He's had no chest pain or pressure. He uses CPAP at night time. He admits to occasional leg swelling. No cough or wheezing.  Past Medical History:  Diagnosis Date  . Allergic rhinitis   . Anginal pain ([place]) 05/26/14   chest pain after chasing dog  . CAD (coronary artery disease)    hx of stent- 2005 RCA  . CHF (congestive heart failure) ([place])   . Chronic back pain    intermittent  . Constipation   . Dizziness   . Dysrhythmia    right bundle branch block   . Esophageal stricture   . GERD (gastroesophageal reflux disease)   . Hemoptysis   . Hiatal hernia   . History of colonic polyps    hyperplastic  . HTN (hypertension)   . Hyperlipidemia   . Hypertrophy of prostate with urinary obstruction and other lower urinary tract symptoms (LUTS)   . OA (osteoarthritis)   . OSA (obstructive sleep apnea)    cpap- 10   . 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 Pneumonia   . Shortness of breath    with exertion  . Testicular hypofunction     Past Surgical History:  Procedure Laterality Date  . CARDIAC  CATHETERIZATION     '05, last 2009, showing patent RCA stent  . COLONOSCOPY  12/2007   HYPERPLASTIC POLYP  . COLONOSCOPY WITH PROPOFOL N/A 06/02/2014   Procedure: COLONOSCOPY WITH PROPOFOL;  Surgeon: [name], MD;  [facility];  Service: Endoscopy;  Laterality: N/A;  . CORONARY ANGIOPLASTY  08/2003  . ESOPHAGOGASTRODUODENOSCOPY (EGD) WITH PROPOFOL N/A 06/02/2014   Procedure: ESOPHAGOGASTRODUODENOSCOPY (EGD) WITH PROPOFOL;  Surgeon: [name], MD;  [facility];  Service: Endoscopy;  Laterality: N/A;  . hernia surgery x 3     Bilateral Inguinal, Umbicial  . IRRIGATION AND DEBRIDEMENT ABSCESS Left 08/14/2012   Procedure: IRRIGATION AND DEBRIDEMENT LEFT INGUINAL BOIL ;  Surgeon: Ailene Rud, MD;  [facility];  Service: Urology;  Laterality: Left;  . LEFT HEART CATHETERIZATION WITH CORONARY ANGIOGRAM N/A 05/26/2014   Procedure: LEFT HEART CATHETERIZATION WITH CORONARY ANGIOGRAM;  Surgeon: Blane Ohara, MD;  Location: [facility] CATH LAB;  Service: Cardiovascular;  Laterality: N/A;  . LUMBAR LAMINECTOMY/DECOMPRESSION MICRODISCECTOMY Left 09/26/2014   Procedure: Lumbar Laminectomy for resection of synovial cyst  Lumbar five- sacral one left;  Surgeon: Kary Kos, MD;  Location: [place] [facility];  Service: Neurosurgery;  Laterality: Left;  . Oral surg to removed growth from ?sinus  10/2010   ?Dermoid removed by DrRiggs  . RCA stenting     '  05 RCA  . right toe surgery  Right    Cyst   . s/p right knee arthroscopy  2005  . TOTAL KNEE ARTHROPLASTY Right 03/12/2016   Procedure: RIGHT TOTAL KNEE ARTHROPLASTY;  Surgeon: Paralee Cancel, MD;  [facility];  Service: Orthopedics;  Laterality: Right;  . UPPER GASTROINTESTINAL ENDOSCOPY      Current Outpatient Prescriptions  Medication Sig Dispense Refill  . ANDROGEL PUMP 20.25 MG/ACT (1.62%) GEL Apply 2 Act topically daily. On each shoulder    . atorvastatin (LIPITOR) 20 MG tablet Take 1 tablet (20 mg total) by mouth  daily at 6 PM. 90 tablet 2  . b complex vitamins tablet Take 1 tablet by mouth daily.     . carvedilol (COREG) 12.5 MG tablet Take 1 tablet (12.5 mg total) by mouth 2 (two) times daily with a meal. 180 tablet 2  . Coenzyme Q10 (CO Q 10) 100 MG CAPS Take 1 capsule by mouth daily.     . diclofenac sodium (VOLTAREN) 1 % GEL APPLY 2 GRAMS TO AFFECTED AREA 2 TO 4 TIMES DAILY PRN  3  . fish oil-omega-3 fatty acids 1000 MG capsule Take 1 g by mouth daily.     . folic acid (FOLVITE) 643 MCG tablet Take 400 mcg by mouth daily.     . Glucosamine-Chondroitin 1500-1200 MG/30ML LIQD Take 1 tablet by mouth daily.     . hydroxypropyl methylcellulose / hypromellose (ISOPTO TEARS / GONIOVISC) 2.5 % ophthalmic solution Place 1 drop into both eyes daily as needed for dry eyes.    Marland Kitchen losartan (COZAAR) 100 MG tablet Take 1 tablet (100 mg total) by mouth daily. 90 tablet 2  . MAGNESIUM PO Take 1 tablet by mouth every other day.     . Multiple Vitamin (MULTIVITAMIN) capsule Take 1 capsule by mouth daily.      . multivitamin-lutein (OCUVITE-LUTEIN) CAPS capsule Take 1 capsule by mouth daily.    . nabumetone (RELAFEN) 500 MG tablet Take 1 tablet (500 mg total) by mouth 2 (two) times daily. 180 tablet 0  . NON FORMULARY CPAP machine    . pantoprazole (PROTONIX) 40 MG tablet TAKE 1 TABLET BY MOUTH EVERY DAY BEFORE BREAKFAST 90 tablet 3  . POTASSIUM PO Take 1 tablet by mouth every other day.     . Probiotic Product (PROBIOTIC DAILY PO) Take 1 capsule by mouth daily.     . tamsulosin (FLOMAX) 0.4 MG CAPS capsule Take 0.4 mg by mouth at bedtime.    . traMADol (ULTRAM) 50 MG tablet Take 1 tablet (50 mg total) by mouth 3 (three) times daily as needed. 270 tablet 0  . TURMERIC PO Take 1 tablet by mouth daily.      No current facility-administered medications for this visit.    Allergies:   Sulfamethoxazole-trimethoprim and Sulfonamide derivatives   Social History:  The patient  reports that he quit smoking about 26 years  ago. He has never used smokeless tobacco. He reports that he drinks alcohol. He reports that he does not use drugs.   Family History:  The patient's family history includes Coronary artery disease in his father; Diabetes in his father; Heart disease in his father and mother; Hypertension in his unknown relative; Stomach cancer in his paternal grandmother; Sudden death in his father.   ROS:  Please see the history of present illness.  Otherwise, review of systems is positive for leg swelling, visual disturbance, DOE, muscle pain, leg pain.  All other systems are  reviewed and negative.    PHYSICAL EXAM: VS:  BP (!) 146/82   Pulse 81   Ht 5\' 7"  (1.702 m)   Wt 184 lb (83.5 kg)   BMI 28.82 kg/m  , BMI Body mass index is 28.82 kg/m. GEN: Well nourished, well developed, in no acute distress  HEENT: normal  Neck: no JVD, no masses. No carotid bruits Cardiac: RRR without murmur or gallop                Respiratory:  clear to auscultation bilaterally, normal work of breathing GI: soft, nontender, nondistended, + BS MS: no deformity or atrophy  Ext: no pretibial edema, pedal pulses 2+= bilaterally Skin: warm and dry, no rash Neuro:  Strength and sensation are intact Psych: euthymic mood, full affect  EKG:  EKG is ordered today. The ekg ordered today shows normal sinus rhythm with first-degree AV block and occasional PVCs, left bundle branch block. No change from previous. QRS duration 162 ms.  Recent Labs: 10/23/2016: ALT 22; BUN 22; Creatinine, Ser 0.90; Hemoglobin 14.7; Platelets 242.0; Potassium 4.2; Sodium 138; TSH 3.19 11/27/2016: Magnesium 2.1; NT-Pro BNP 276   Lipid Panel     Component Value Date/Time   CHOL 138 11/27/2016 0914   TRIG 188 (H) 11/27/2016 0914   HDL 35 (L) 11/27/2016 0914   CHOLHDL 3.9 11/27/2016 0914   CHOLHDL 4 12/07/2015 0742   VLDL 30.6 12/07/2015 0742   LDLCALC 65 11/27/2016 0914      Wt Readings from Last 3 Encounters:  11/27/16 184 lb (83.5 kg)    10/23/16 186 lb (84.4 kg)  10/16/16 188 lb (85.3 kg)     Cardiac Studies Reviewed: 2D Echo 03-08-2015: Study Conclusions  - Left ventricle: The cavity size was normal. Wall thickness was   increased in a pattern of mild LVH. Systolic function was   moderately reduced. The estimated ejection fraction was in the   range of 35% to 40%. Septal-lateral dyssynchrony with diffuse   hypokinesis appearing worse in the septum. Doppler parameters are   consistent with abnormal left ventricular relaxation (grade 1   diastolic dysfunction). - Aortic valve: There was no stenosis. There was trivial   regurgitation. - Mitral valve: There was mild regurgitation. - Left atrium: The atrium was mildly dilated. - Right ventricle: The cavity size was normal. Systolic function   was normal. - Tricuspid valve: Peak RV-RA gradient (S): 23 mm Hg. - Pulmonary arteries: PA peak pressure: 26 mm Hg (S). - Inferior vena cava: The vessel was normal in size. The   respirophasic diameter changes were in the normal range (= 50%),   consistent with normal central venous pressure.  Impressions:  - Normal LV size with mild LV hypertrophy. EF 35-40% with   septal-lateral dyssynchrony and diffuse hypokinesis appearing   worse in the septum. Normal RV size and systolic function. Mild   MR. If ICD is being considered, could do cardiac MRI.   ASSESSMENT AND PLAN: 1.  Chronic systolic heart failure, New York Heart Association functional class IIIb. The patient's exam is benign with no evidence of volume overload. His BNP in January was normal. He does have significant LV dysfunction and a wide left bundle branch block. I reviewed his previous echo from 2016 and I think this should be updated considering his progressive symptoms. Will also check a cardiopulmonary exercise test to try to differentiate cardiovascular limitation from deconditioning versus pulmonary limitation. He will continue on carvedilol and losartan  at current doses.  If there is been further decline in LV function will consider adjustment of his medical program and/or further diagnostic studies.  2. Coronary artery disease, native vessel, without angina:the patient will continue current medications.  3. Hyperlipidemia:Will update a lipid panel today.  4. Hypertension:continue carvedilol and losartan. Consider aldactone and/or entresto pending echo review.   5. Leg cramps: advised tonic water at bedtime. Will check magnesium level. Recent labs reviewed and WNL.  Current medicines are reviewed with the patient today.  The patient does not have concerns regarding medicines.  Labs/ tests ordered today include:   Orders Placed This Encounter  Procedures  . Cardiopulmonary exercise test  . Magnesium  . Pro b natriuretic peptide  . Lipid panel  . EKG 12-Lead  . ECHOCARDIOGRAM COMPLETE    Disposition:   FU 6 months  Signed, [name], MD  11/29/2016 4:24 AM    [facility] [address], [place], [place]  [address] Phone: [phone]; Fax: [phone]

## 2016-11-29 ENCOUNTER — Encounter: Payer: Self-pay | Admitting: Cardiovascular Disease

## 2016-12-03 ENCOUNTER — Ambulatory Visit (HOSPITAL_COMMUNITY): Payer: Medicare Other | Attending: Cardiology

## 2016-12-03 ENCOUNTER — Other Ambulatory Visit: Payer: Self-pay

## 2016-12-03 DIAGNOSIS — I517 Cardiomegaly: Secondary | ICD-10-CM | POA: Insufficient documentation

## 2016-12-03 DIAGNOSIS — I5022 Chronic systolic (congestive) heart failure: Secondary | ICD-10-CM | POA: Diagnosis not present

## 2016-12-03 DIAGNOSIS — R0600 Dyspnea, unspecified: Secondary | ICD-10-CM | POA: Diagnosis not present

## 2016-12-03 DIAGNOSIS — I340 Nonrheumatic mitral (valve) insufficiency: Secondary | ICD-10-CM | POA: Diagnosis not present

## 2016-12-03 DIAGNOSIS — I5189 Other ill-defined heart diseases: Secondary | ICD-10-CM | POA: Insufficient documentation

## 2016-12-03 DIAGNOSIS — I371 Nonrheumatic pulmonary valve insufficiency: Secondary | ICD-10-CM | POA: Diagnosis not present

## 2016-12-04 ENCOUNTER — Ambulatory Visit (HOSPITAL_COMMUNITY): Payer: Medicare Other | Attending: Cardiovascular Disease

## 2016-12-04 ENCOUNTER — Encounter (HOSPITAL_COMMUNITY): Payer: Self-pay | Admitting: *Deleted

## 2016-12-04 ENCOUNTER — Other Ambulatory Visit (HOSPITAL_COMMUNITY): Payer: Self-pay | Admitting: *Deleted

## 2016-12-04 DIAGNOSIS — E669 Obesity, unspecified: Secondary | ICD-10-CM | POA: Diagnosis not present

## 2016-12-04 DIAGNOSIS — R0600 Dyspnea, unspecified: Secondary | ICD-10-CM | POA: Insufficient documentation

## 2016-12-04 DIAGNOSIS — I5022 Chronic systolic (congestive) heart failure: Secondary | ICD-10-CM

## 2016-12-04 DIAGNOSIS — I493 Ventricular premature depolarization: Secondary | ICD-10-CM | POA: Diagnosis not present

## 2016-12-05 ENCOUNTER — Ambulatory Visit: Payer: Medicare Other | Admitting: Pulmonary Disease

## 2016-12-24 ENCOUNTER — Ambulatory Visit: Payer: Medicare Other | Admitting: Pulmonary Disease

## 2016-12-25 ENCOUNTER — Telehealth: Payer: Self-pay | Admitting: Cardiovascular Disease

## 2016-12-25 NOTE — Telephone Encounter (Signed)
F/u message  Pt states Dr. Burt Knack gave him a call yesterday and he was returning the call. Please call back to discuss

## 2016-12-26 NOTE — Telephone Encounter (Signed)
Follow Up   Pt calling Dr. Burt Knack back

## 2016-12-31 ENCOUNTER — Encounter: Payer: Self-pay | Admitting: Pulmonary Disease

## 2016-12-31 ENCOUNTER — Ambulatory Visit (INDEPENDENT_AMBULATORY_CARE_PROVIDER_SITE_OTHER): Payer: Medicare Other | Admitting: Pulmonary Disease

## 2016-12-31 VITALS — BP 120/72 | HR 76 | Temp 97.5°F | Ht 67.0 in | Wt 185.2 lb

## 2016-12-31 DIAGNOSIS — M150 Primary generalized (osteo)arthritis: Secondary | ICD-10-CM

## 2016-12-31 DIAGNOSIS — Z96651 Presence of right artificial knee joint: Secondary | ICD-10-CM | POA: Diagnosis not present

## 2016-12-31 DIAGNOSIS — M159 Polyosteoarthritis, unspecified: Secondary | ICD-10-CM

## 2016-12-31 DIAGNOSIS — I2510 Atherosclerotic heart disease of native coronary artery without angina pectoris: Secondary | ICD-10-CM

## 2016-12-31 DIAGNOSIS — R0609 Other forms of dyspnea: Secondary | ICD-10-CM | POA: Diagnosis not present

## 2016-12-31 DIAGNOSIS — Z8701 Personal history of pneumonia (recurrent): Secondary | ICD-10-CM | POA: Diagnosis not present

## 2016-12-31 DIAGNOSIS — I10 Essential (primary) hypertension: Secondary | ICD-10-CM

## 2016-12-31 DIAGNOSIS — G4733 Obstructive sleep apnea (adult) (pediatric): Secondary | ICD-10-CM

## 2016-12-31 NOTE — Telephone Encounter (Signed)
Follow up   Pt calling Dr. Burt Knack back

## 2016-12-31 NOTE — Progress Notes (Signed)
Subjective:    Patient ID: [name], male    DOB: February 20, 1946, 71 y.o.   MRN: [id number]  HPI 71 y/o WM here for a follow up visit... he has multiple medical problems as noted below...   ~  SEE PREV EPIC NOTES FOR OLDER DATA >>    ~  June 18, 2010:  He's had a good 38mo> still c/o bilat CTS symtpms in the AM & he will try the wrist splints Qhs...  he was started on AXIRON topical testosterone Rx per DrTannenbaum & improved...  ~  January 07, 2011:  He continues to f/u w/ DrCooper for Cards every 38mo- seen last wk> CAD, s/p stent RCA 2005, LBBB; last cath was 2009 w/ patent RCA stent & mild non-obstructive dis elsewhere; doing well, exercising regularly, no CP/ palpit/ SOB/ edema/ etc; rec to continue ASA/ Plavix,   LABS 2/13:  FLP- elevTG & lowHDL on Cres10;  Chems- wnl;  CBC- wnl;  TSH=1.91;  PSA=2.08;  VitD=58;  UA- clear  LABS 7/13 by DrDeveshwar:  Chems- wnl x BS=109;  CBC- wnl   ~  November 29, 2014:  3 year ROV & [name] returns after a long hiatus, expertly cared for in the interval by DrCooper for Cards, DrCram for NS, DrDeveshwar for Rheum, DrTannenbaum for Urology, and DrCornett for CCS... He tells me that he needs 2 knees, had lumbar lam by DrCram about 36mo ago, and he wants to see ENT due to losing his voice- voice getting weaker assoc w/ dry cough, denies sput, c/o mild SOB "it's my heart"    OSA> stable on CPAP w/ supplies Q21mo from [place]; no issues w/ his machine or interface; rests well, wakes refreshed, no daytime hypersomnolence & no prob w/ alertness...    HBP & CAD, s/p stent in RCA 2005, LBBB, decr EF w/ septal dysynergy, chr dyspnea> followed by DrCooper on ASA/ Plavix/ Coreg12.5Bid/ Cozaar100; BP= 142/72 & he denies CP, palpit, dizzy, SOB, edema; last saw DrCooper 07/2014 & his note is reviewed    CHOL> on Lip20, FishOil, CoQ10; FLP 9/15 showed TChol 125, TG 154, HDL 30, LDL 64; needs better low fat diet...    GI> HH, Hx stricture, antral nodule=adenoma> on Protonix40  followed by Ardis Hughs (see below); colonoscopy 1/16 was neg/ wnl...    GU> BPH, LTOS, Low-T> on Flomax0.4 & Androgel per DrTannenbaum; he relates a story about GU symptoms & extensive work up in their office "doing just fine" he says, incr energy, etc; treated for seminal vesiculitis w/ Doxy in the past; last seen 3/16 and note reviewed...     Ortho> DJD, LBP> prev on Celebrex & switched to Etodolac & now Percocet & Tramadol; he notes that he's been hurting since he was 71 y/o w/ a strong fam hx arthritis; he saw DrDeveshwar for Rheum & DrCram for NS- s/p Lumbar decompression for resection of synovial cyst & large bone spur L5-S1    Derm> Hx skin cancers treated by DrDJones... EXAM showed Afeb, VSS, O2sat=96% on RA;  HEENT- neg, mallampati1;  Chest- clear w/o w/r/r;  Heart- RR gr1/6 SEM S4 w/o rubs;  Abd- soft, nontender, neg;  Ext- neg w/o c/c/e;  Neuro- intact... We reviewed prob list, meds, xrays and labs> see below for updates >> given TDAP & Prevnar-13, he reports that he got the Pneumovax23 at health dept in 2009...  2DEcho 07/22/11 showed mild LVH, & decr LVF w/ EF~30% w/ HK, Gr1 DD, mild MR, mild LAdil, .Marland Kitchen  CXR 08/13/12 showed norm heart size, clear lungs, NAD.Marland KitchenMarland Kitchen   EKG 05/24/14 showed NSR, rate76, LBBB, no acute changes...  CATH [id number] showed patent coronaries and open stent in RCA, nonobstructive dis in LAD, mild to mod LVD (EF=50%) w/ norm LVEDP- medical management.  Spirometry 11/29/14 showed FVC=2.84 (67%), FEV1=2.05 (63%), %1sec=72, mid-flows reduced at 46% predicted; c/w mild airflow obstruction but can't r/o superimposed restriction w/o lung volumes, etc...   LABS 5/16 showed Chems- wnl & CBC- wnl IMP/PLAN>>  A lot to get caught up on after a 3 yr hiatus;  meds refilled, he wants ZPak for prn use, TDAP & Prevnar-13 today, refer to ENT for check due to losing his voice...  ~  December 06, 2015:  Yearly [place] f/u visit>  [name] reports an number of things that have occurred over the past  yr:  1) root canal done yest, on ?Clinda300tid (didn't bring meds or list to todays visit) & reminded to take Probiotic daily + Activia yogurt;  2) has "stress fx" R knee, eval by DrOlin- wearing brace & has cane;  3) Back surg 5/16 by DrCram w/ "cyst" removed;  4) he says XRays and MRIs have shown that he is "eaten up w/ arthritis" and there is nothing else that they can do; he sees Rheum- DrDeveshwar & she will check him before he travels & give him a shot... 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    HBP & CAD, s/p stent in RCA 2005, LBBB, decr EF 35-40% w/ septal dysynergy, chr dyspnea> followed by DrCooper off Plavix, on ASA/ Coreg12.5Bid/ Cozaar100; BP= 128/72 & he denies CP, palpit, dizzy, SOB, edema; last saw DrCooper 6/17 & his note is reviewed- HBP, CAD, s/p PCI to RCA, LBBB, no recurrent ischemia (cath 1/16 w/ patent stent) & he has chr dyspnea; lim in exerc by his knee prob; he worries a lot about statin related side effects.    CHOL> on Lip20, FishOil, CoQ10; FLP 10/16 showed TChol 121, TG 160, HDL 32, LDL 58; needs better low fat diet...    GI> HH, Hx stricture, antral nodule=adenoma> on Protonix40 followed by DrJacobs (see below); EGD 1/16 showed 3cmHH & schatzki's ring, duodenal adenoma resected & a subepithelial antral lesion; colonoscopy 1/16 was neg/ wnl...    GU> BPH, LTOS, Low-T> on Flomax0.4 & Androgel per DrTannenbaum; he relates a story about GU symptoms & extensive work up in their office "doing just fine" he says, incr energy, etc; treated for seminal vesiculitis w/ Doxy in the past; last seen 6/17 and  note reviewed- LTOS & Low-T, on Flomax & Androgel...    Ortho> DJD, LBP> prev on Celebrex & Percocet, switched to Relafen & Tramadol; he notes that he's been hurting since he was 71 y/o w/ a strong fam hx arthritis; he saw DrDeveshwar for Rheum & DrCram for NS- s/p Lumbar decompression for resection of synovial cyst & large bone spur L5-S1    Derm> Hx skin cancers treated by DrDJones... EXAM showed Afeb, VSS, O2sat=96% on RA;  HEENT- neg, mallampati1;  Chest- clear w/o w/r/r;  Heart- RR gr1/6 SEM S4 w/o rubs;  Abd- soft, nontender, neg;  Ext- neg w/o c/c/e;  Neuro- intact...  CXR 12/06/15> heart at upper lim of norm, lungs essentially clear w/ some scarring on left, NAD...  FASTING blood work> he never returned for FASTING labs as requested... IMP/PLAN>>  Clark appears stable w/ his medical care distributed among his specialists- DrCooper, DrJacobs, DrTannenbaum, DrDeveshwar, DrCram; continue present meds...   ~  April 15, 2016:  75mo [place] saw SG 3wks ago w/ nonprod cough, low grade temp~100, weakness; he had R-TKR 03/12/16 by DrOlin & post op rehab has been rough- on musc relaxer & Norco, Celebrex added & using a walker;  Exam was neg- min leg swelling, chest clear, VSS;  He was given a ZPak & checked VenDopplers- neg, no DVT;  He is here for f/u today- still in lots of pain from the knee surg but getting better slowly he says & the ZPak really helped overall;  He notes that he may need his left knee done later! EXAM showed Afeb, VSS, O2sat=97% on RA;  HEENT- neg, mallampati1;  Chest- clear w/o w/r/r;  Heart- RR gr1/6 SEM S4 w/o rubs;  Abd- soft, nontender, neg;  Ext- s/p R-TKR, mild edema, no c/c;  Neuro- intact...  CXR 03/27/16 (independently reviewed by me in the PACS system)>  Borderline heart size w/o edema, mild basilar atx vs scarring, NAD.Marland KitchenMarland Kitchen   Ven Dopplers 03/28/16>  RLE neg for DVT, pos for baker's cyst...  LABS 02/2016 in Epic>  Chems- wnl x BS=130 range;  CBC- mild anemia post op  w/ Hg=11.2, mcv=90;  Blood type= Opos IMP/PLAN>>  [name] is slowly improving- URI resolved, getting better w/ exercise/ actiity/ to start PT etc;  OK 2017 Flu vaccine today & we'll plan ROV ~46mo...  ~  May 27, 2016:  6wk ROV & add-on appt requested for hemoptysis>  [name] says he developed a cough around Christmas week, deep cough, small amt green sput,  Low grade fever w/o c/s, sl incr SOB, no CP etc;  DrByrum called in a ZPak & the sput seemed to clear, then 3-4d ago he noted small amt streaky hemoptysis but SOB was better & still no discomfort noted;  He feels weak, had Cards f/u DrCooper & doing satis- told to call us for f/u appt;      OSA> stable on CPAP w/ supplies Q27mo from [place]; no issues w/ his machine or interface; rests well, wakes refreshed, no daytime hypersomnolence & no prob w/ alertness; he had ENT eval 10/16 by DrByers for hoarseness & losing his voice, exam was NEG, 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    HBP & CAD, s/p stent in RCA 2005, LBBB, decr EF 35-40% w/ septal dysynergy, chr dyspnea> followed by DrCooper off Plavix, on ASA/ Coreg12.5Bid/ Cozaar100; BP= 128/72 & he denies CP, palpit, dizzy, SOB, edema; last saw DrCooper 6/17 & his note is reviewed- HBP, CAD, s/p PCI to RCA, LBBB, no recurrent ischemia (cath 1/16 w/ patent stent) & he has chr dyspnea; lim in exerc by his knee prob; he worries a lot about statin related side effects.    CHOL> on Lip20, FishOil, CoQ10; FLP 7/17 showed TChol 134, TG 153, HDL 32, LDL 72; needs better low fat diet...    GI> HH, Hx stricture, antral nodule=adenoma> on Protonix40 followed by DrJacobs (see below); EGD 1/16 showed 3cmHH & schatzki's ring, duodenal  adenoma resected & a subepithelial antral lesion; colonoscopy 1/16 was neg/ wnl...    GU> BPH, LTOS, Low-T> on Flomax0.4 & Androgel per DrTannenbaum; he relates a story about GU symptoms & extensive work up in their office "doing  just fine" he says, incr energy, etc; treated for seminal vesiculitis w/ Doxy in the past; last seen 6/17 and note reviewed- LTOS & Low-T, on Flomax & Androgel...    Ortho> DJD, LBP> prev on Celebrex & Percocet, switched to [place]; he notes that he's been hurting since he was 71 y/o w/ a strong fam hx arthritis; he saw DrDeveshwar for Rheum, DrOlin for Ortho, & DrCram for NS- s/p Lumbar decompression for resection of synovial cyst & large bone spur L5-S1; s/p R-TKR 10/17 w/ painful recovery...    Derm> Hx skin cancers treated by DrDJones...    EXAM confirms Afeb, VSS w/ BP=128/82, O2sat=96% on RA;  HEENT- neg, mallampati2;  Chest- clear w/o w/r/r;  Heart- RR w/o m/r/g heard;  Abd- soft, nontender, neg;  Ext- s/p R-TKR, decr ROM, VI, tr edema w/o c/c;  Neuro- ok.  CXR 05/27/16 (independently reviewed by me in the PACS system)>  Borderline heart size, mild patchy lingular density & ?vol loss, otherw clear- r/o lingular pneumonia...  CT Angio Chest 05/27/16 (independently reviewed by me in the PACS system)>  NEG for PE, borderline heart size, mild coronary calcif, no adenopathy, perihilar GGO c/w pneumonia, mild atx...   LABS 05/27/16>  D-dimer is pos at 2.32;  Chems- wnl x BS=119;  CBC- wnl w/ Hg=13.6, WBC=7.6K & norm diff;   IMP/PLAN>>  Hemoptysis w/ poss CAP- on ASA, s/p TKR in recovery, and eval is discordant w/ ?low grade fever, normal wbc, pos D-dimer, neg CTA (w/o PE), prev neg VenDoppler;  CXR & CT w/ GGO right perihilar suggesting infection & we decided to treat w/ Levaquin, Pred20mg -4d taper, Hycodan, and short term f/u appt...  ~  June 19, 2016:  3wk ROV & [name] returns having completed the oral Levaquin & Pred taper for his poss lingular pneumonia w/ hemoptysis> he reports much better "90% improved" w/o recurrent blood, cough, sput, etc; still using Hycodan as needed;  He wanted me to know that his brother Jeneen Rinks [birth year]2/16/42 has lung dis- COPD & Sarcoid, and his son Alyson Locket. Allred 07/1958  also has Sarcoid (their ancestors are from [place] for >100 yrs)... We decided to check f/u CXR & LABS> see prob list above...    EXAM confirms Afeb, VSS w/ BP=140/82, O2sat=95% on RA;  HEENT- neg, mallampati2;  Chest- clear decr BS w/o w/r/r;  Heart- RR S4 gallop w/o m/r heard;  Abd- soft, nontender, neg;  Ext- s/p R-TKR, decr ROM, VI, tr edema w/o c/c;  Neuro- ok.  CXR 06/19/16 (independently reviewed by me in the PACS system)> norm heart size, min atx in lingula, otherw norm & no focal infiltrate...  LABS 06/19/16>  BNP= 98;  ACE= 49... IMP/PLAN>>  CXR has cleared and BNP/ ACE are wnl;  He continues on CPAP & his cardiac follow up; call for any interval problems...  ~  October 23, 2016:  66mo ROV & [name] reports that the prev pneumonia resolved, no further cough, sputum, hemoptysis, etc... However Amere's CC now is SOB/ DOE w/ activity- he notes even min activity produces dyspnea & it has him concerned;  He questions whether this prob is pulmonary or cardiac in origin & we discussed the appropriate eval for each organ system; however it should  be noted that he has been very sedentary for quite some time- since his prev back surg 3 yrs ago, & esp since to his 02/2016 R-TKR surgery; he notes DOE when carrying a bag of soil for yard work, walking into our office from the parking lot, even during sex;  CT ANGIO CHEST was NEG for PE in 05/2016;  He has known HBP, CAD- s/p stent, LBBB, decr EF w/ septal dysynergy followed by DrCooper=> last seen 05/2016> felt to be stable;  CATH 05/2014 showed patent coronaries and open stent in RCA, nonobstructive dis in LAD, mild to mod LVD (EF=50%) w/ norm LVEDP- medical management.  Last 2DEcho 02/2015 showed mild LVH, mod decr LVF w/ EF=35-40%, diffuse HK w/ dyssynchrony, Gr1DD, trivAI, mildMR, mild LA dil at 29mm, RV & PAsys were wnl;  EKG 05/2016 showed NSR, 1st degree AVB, LBBB... Due to his level of concern for his dyspnea we reviewed the need for thorough Pulm & Cardiac  re-assessments => We reviewed the following medical problems during today's office visit >>     DYSPNEA>> I suspect most from deconditioning!  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    HBP & CAD, s/p stent in RCA 2005, LBBB, decr EF 35-40% w/ septal dysynergy, chr dyspnea> followed by DrCooper off Plavix, on ASA/ Coreg12.5Bid/ Cozaar100; BP= 128/72 & he denies CP, palpit, dizzy, SOB, edema; last saw DrCooper 6/17 & his note is reviewed- HBP, CAD, s/p PCI to RCA, LBBB, no recurrent ischemia (cath 1/16 w/ patent stent) & he has chr dyspnea; lim in exerc by his knee prob; he worries a lot about statin related side effects.    CHOL> on Lip20, FishOil, CoQ10; FLP 7/17 showed TChol 134, TG 153, HDL 32, LDL 72; needs better low fat diet...    GI> HH, Hx stricture, antral nodule=adenoma> on Protonix40 followed by DrJacobs (see below); EGD 1/16 showed 3cmHH & schatzki's ring, duodenal adenoma resected & a subepithelial antral lesion; colonoscopy 1/16 was neg/ wnl...    GU> BPH, LTOS, Low-T> on Flomax0.4 & Androgel per DrTannenbaum; he relates a story about GU symptoms & extensive work up in their office "doing just fine" he says, incr energy, etc; treated for seminal vesiculitis w/ Doxy in the past; last seen 6/18 and note reviewed- LTOS & Low-T, on Flomax & Androgel 2 pumps and Testos=461 on rx and PSA=1.57... Note- DrT has retired.    Ortho> DJD, LBP> prev on Celebrex & Percocet, switched to [place]; he notes that he's been hurting since he was 71 y/o w/ a strong fam hx arthritis; he saw DrDeveshwar for Rheum, DrOlin  for Ortho, & DrCram for NS- s/p Lumbar decompression for resection of synovial cyst & large bone spur L5-S1; s/p R-TKR 10/17 w/ painful recovery...    Derm> Hx skin cancers treated by DrDJones... EXAM confirms Afeb, VSS w/ BP=110/60, O2sat=95% on RA;  HEENT- neg, mallampati2;  Chest- clear decr BS w/o w/r/r;  Heart- RR S4 gallop w/o m/r heard;  Abd- soft, nontender, neg;  Ext- s/p R-TKR, decr ROM, VI, tr  edema w/o c/c;  Neuro- ok...   2DEcho 02/2015 showed mild LVH, mod decr LVF w/ EF=35-40%, diffuse HK w/ dyssynchrony, Gr1DD, trivAI, mildMR, mild LA dil at 33mm, RV & PAsys were wnl;    EKG 05/2016 showed NSR, 1st degree AVB, LBBB...   CXR 10/23/16>  Stable heart size, clear lungs x for some lingula scarring- NAD...  Spirometry 10/23/16>  FVC=3.20 (83%), FEV1=2.12 (76%), %1sec=66, mid-flows reduced at 54% predicted...  Ambulatory Oximetry on RA>  O2sat=95% on RA at rest w/ pulse=85/min;  He ambulated 3 Laps (185' each) in office w/ lowest O2sat=92% w/ pulse 98/min...   LABS 10/23/16>  Chems- wnl x BS=111;  CBC- wnl;  TSH- 3.19;  Sed- 10 IMP/PLAN>>  I explained to Kemon that in my experience that this type of DOE is most often multifactorial- pulm factors do not appear to be playing much of a roll here but I suspect that DECONDITIONING is major factor along w/ his cardiomyopathy;  We reviewed diet & exercise recommendations + need for Cards f/u w/ up-dated 2DEcho, perhaps Cardiac Rehab might be of benefit...   ~  December 31, 2016:  1mo ROV & [name] returns and reports that his dyspnea is about the same, unfortunately he has been unable to exercise due to severe back pain & knee pain he says;  When we saw him 10/2016 his pulmonary/ medical eval was not very revealing- only mild airflow obstruction on Spirometry, min scarring on CXR, and no O2 desat w/ exercise;  He is stable on his home CPAP;  Review of his prev Cardiac eval showed known CAD- s/p stents, mod LV dysfunction (chr sys CHF), LBBB, and he is on  Losar100, Coreg12.5Bid, but no diuretics...  See prob list above>    He saw DrCooper 11/27/16>  He was impressed by his c/o dyspnea & fatigue (esp if carrying a load), but his exam was fairly neg;  They decided to proceed w/ CP Stress Testing & update 2DEcho--  2DEchocardiogram 12/03/16>  Mild conc hypertropy, mod to severe reduction in LVF w/ EF=30-35% w3/ diffuse HK & septal-lat dyssynchrony, no regional wall motion abn, Gr2DD, TrivAI & mod to severe MR, dil LA at 101mm, PAsys est~49mmHg  Cardio-pulm stress test 12/04/16>  low normal pVO2 in the setting of a mildly submaximal test- the patient's limitation seems multifactorial due to a combination of mild HF, obesity and restrictive lung physiology. There were also frequent PVCs... EXAM confirms Afeb, VSS w/ BP=120/72, O2sat=95% on RA;  HEENT- neg, mallampati2;  Chest- clear decr BS w/o w/r/r;  Heart- RR S4 gallop w/o m/r heard;  Abd- soft, nontender, neg;  Ext- s/p R-TKR, decr ROM, VI, tr edema w/o c/c;  Neuro- ok...  IMP/PLAN>>  I think the majority of his worsening dyspnea is prob related to his progressive cardiomyopathy & chr sys CHF, with components from deconditioning, Low-T (treated w/ androgel), etc;  He has an upcoming f/u w/ Cards- DrCooper to discuss results- perhaps referral to the CHF clinic would be beneficial, and consideration of Cardiac Rehab (again- he did it in 2006) might be helpful;  [facility]  He has a planned 10d trip to [place] coming up in Sept...           PROBLEM LIST:  ENT >> some type of ENT/ Oral surg 7/12> referred by his dentist to [facility] w/ surg thru gums to remove a growth in his sinus= ?dermoid...  OBSTRUCTIVE SLEEP APNEA -  Hx OSA w/ sleep study 11/02 RDI=18 and desat to 85%; seen by DrClance and on CPAP rx;  he's been using it regularly, denies daytime hypersomnolence, etc;  He gets machine check & new mask Q29mo thru [employer] & he reports download showed 12,000 hrs  use! ~  CXR 3/10 clear w/ min left basilar scarring... ~  Pt gets his CPAP machine checked & masks replaced every 63mo thru [place]... ~  CXR 10/12 showed normal heart size, clear lungs x left base atx, mild degen changes in TSpine...  Episode of [place] 4/10 after episode of BRB hemoptysis> full eval was neg, no recurrence... he had bronchoscopy- neg,  ENT eval DrRosen- neg, noted some persistant cough treated w/ Mucinex DM, Zyrtek, Tussionex...  no recurrence. ~  NOTE> he is an exsmoker: started at ~11, smoked up til ~41, for a 30 yr smoking hx & he quit >30 yrs ago... ~  05/2016>  Another episode of streaky hemoptysis 6wks after R-TKR, in recovery phase, no CP, mild SOB, low grade fever & eval was discordant w/ low grade fever, norm WBC, ?lingular opac on CXR/CT, pos D-dimer but neg Vendopplers=> we decided to treat w/ Levaquin, Pred taper, Hycodan & short-term ROV...  HBP & CAD-S/P stent in RCA on 2005 - followed regularly by DrCooper on ASA 81mg /d, off PLAVIX,  COREG 12.5mg Bid,  LOSARTAN 100mg /d...  BP= 120/72 today & similar at home> denies HA, fatigue, visual changes, CP, palipit, dizziness, syncope, dyspnea, edema, etc...  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 ~  2/13: he had f/u DrCooper> still c/o fatigue & dyspnea; no angina, BP controlled, BNP=25, EKG showed NSR, rate72, LBBB;  2DEcho & cardiac MRI are planned... ~  2DEcho 3/13 showed decr EF=30% w/ HK, mild LVH, Gr1DD, mild MR ~  3/13:  Cardiac MRI showed mild LVenlargement, mild LAE, no scar or infarct,  no ischemia, no regional wall motion abn, but there was abn septal motion & EF=48%  HYPERLIPIDEMIA - he follows a low fat diet and takes LIPITOR 20mg /d, off NIACIN, Fish Oil, Flax Seed Oil, & CoQ10... ~  [place] 9/08 showed TChol 110, TG 82, HDL 28, LDL 66 ~  FLP 8/09 showed TChol 177, TG 130, HDL 36, LDL 115 ~  FLP 1/10 showed TChol 114, TG 109, HDL 31, LDL 62 ~  FLP 2/11 showed TChol 103, TG 85, HDL 36, LDL 51 ~  FLP 2/12 showed TChol 101, TG 121, HDL 26, LDL 51 ~  FLP 2/13 on Cres10+Niacin500 showed TChol 124, TG 167, HDL 33, LDL 57  HIATAL HERNIA, Hx of ESOPHAGEAL STRICTURE, OTHER SPECIFIED DISORDER OF STOMACH AND DUODENUM (ICD-537.89) >> he gets the [facility], along w/ the similar product from [employer]- "I read too much"- he stopped PPI/ Dexilant Rx in favor of "Vinegar & Honey- it works as well per the [employer]"...  ~  he had an EGD 6/08 showing a 3 cm HH & distal esoph stricture dilated;  Rx Pepcid 20mg /d... ~  EGD 09/02/08 by  DrPerry showed a HH, schatzki ring, 1cm submucosal nodule in antrum, 2cm sessile polyp in duod. 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 ~  f/u DrPatterson 6/10- continue KAPIDEX & reflux regimen... ~  1/12:  he is overdue for f/u w/ GI & we will request appt. ~  11/12: he saw DrJacobs for f/u eval & had EGD12/12 showing Schatzki's ring, sessile duod polyp (Bx=tubular adenoma), unchanged submucosal nodule... ~  8/13:  He lists PROTONIX 40mg /d on his current med list... ~  1/16:  EGD by DrJacobs showed a schatzki's ring above a 3cm HH, antral subepith lesion unchanged from 3 yrs ago, 33mm polypoid mucosa in duodenum (path= duod adenoma)- they plan repeat in 3 yrs...  Hx of Hyperplastic COLONIC POLYP in 1999 - all followed by DrPatterson;  ~  his last colonoscopy was 8/04 and normal without  recurrent polyps... ~  f/u colonoscopy 8/09 by DrPatterson showed 31mm polyp, bx= hyperplastic, f/u planned 49yrs. ~  1/16:  Colonoscopy by DrJacobs was wnl & f/u rec in 10 yrs.  Hx Elevated LFT's - routine CPX 12/08 w/ abn LFTs- this was felt to be secondary to his Statin therapy... he was told to stop the Statin and f/u labs in one month... he requested a liver evaluation from DrPatterson and he was seen 12/09 at which time DrPatterson rechecked his LFT's and they were all back to normal... he also checked the following: ~  AbdSonar w/ echodense liver, no lesions or GB problems... ~  Viral Hepatitis serologies were all negative, including HepC << 8/12: CDC came out w/ new rec that all Boomers should be checked for HepC. ~  A1AT level was normal ~  Ceruloplasmin level was normal ~  ANA was neg, and Antimitochondrial AB/ Anti Smooth Muscle Ab were neg as well... ~  Serum Fe was normal & B12/Folate levels were normal as well... ~  2/12 NOTE:  LFTs have remained WNL & he is on Cres10.  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 ~  1/12: he reports Rx by DrTannenbaum w/ ANDROGEL 1.62% pump- use under each arm daily; Testos level 12/11= 575 on Rx.  OSTEOARTHRITIS, Hx of BACK PAIN - as noted he feels he must take the Celebrex daily as his pain (esp R knee) is much worse when he doesn't take it;  he sees DrOlin and had a RKnee arthroscopy 9/07> he uses Glucosamine, MVI, Vit D & Folic... ~  7/10:  he requests Rx for Celebrex 100mg  #90- take 1 cap daily as needed (advised just Prn use). ~  8/12:  Pt requests generic substitute for Celebrex (try RELAFEN 500mg ) & referral to DrDeveshwar for Rheumatology eval... ~  12/12:  Seen by DrDeveshwar for his OA, left  shoulder pain w/ rotator cuff tear per DrCollins, tried shot; he has OA in hands and feet; rec Tramadol vs Relafen Rx.. ~  02/2016:  Kawhi had R-TKR by DrOlin, c/o lots of pain/ discomfort after surg & difficulty getting around....  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  HEALTH  MAINTENANCE - PNEUMOVAX given age 26 (2011);  he get the yearly seasonal Flu vaccine every fall;  he had a shingles vaccine as well;  had tetanus shot 2005 w/ trip to [place];  up-to-date on colonoscopy done 2009; prostate checks by DrTannenbaum yearly...   Past Surgical History:  Procedure Laterality Date  . CARDIAC CATHETERIZATION     '05, last 2009, showing patent RCA stent  . COLONOSCOPY  12/2007   HYPERPLASTIC POLYP  . COLONOSCOPY WITH PROPOFOL N/A 06/02/2014   Procedure: COLONOSCOPY WITH PROPOFOL;  Surgeon: [name], MD;  Location: [facility];  Service: Endoscopy;  Laterality: N/A;  . CORONARY ANGIOPLASTY  08/2003  . ESOPHAGOGASTRODUODENOSCOPY (EGD) WITH PROPOFOL N/A 06/02/2014   Procedure: ESOPHAGOGASTRODUODENOSCOPY (EGD) WITH PROPOFOL;  Surgeon: [name], MD;  Location: [facility];  Service: Endoscopy;  Laterality: N/A;  . hernia surgery x 3     Bilateral Inguinal, Umbicial  . IRRIGATION AND DEBRIDEMENT ABSCESS Left 08/14/2012   Procedure: IRRIGATION AND DEBRIDEMENT LEFT INGUINAL BOIL ;  Surgeon: Ailene Rud, MD;  Location: [facility];  Service: Urology;  Laterality: Left;  . LEFT HEART CATHETERIZATION WITH CORONARY ANGIOGRAM N/A 05/26/2014   Procedure: LEFT HEART CATHETERIZATION WITH CORONARY ANGIOGRAM;  Surgeon: Blane Ohara, MD;  Location: [facility] CATH LAB;  Service: Cardiovascular;  Laterality: N/A;  . LUMBAR LAMINECTOMY/DECOMPRESSION MICRODISCECTOMY Left 09/26/2014   Procedure: Lumbar Laminectomy for resection of synovial cyst  Lumbar five- sacral one left;  Surgeon: Kary Kos, MD;  Location: [place] NEURO ORS;  Service:  Neurosurgery;  Laterality: Left;  . Oral surg to removed growth from ?sinus  10/2010   ?Dermoid removed by DrRiggs  . RCA stenting     '05 RCA  . right toe surgery  Right    Cyst   . s/p right knee arthroscopy  2005  . TOTAL KNEE ARTHROPLASTY Right 03/12/2016   Procedure: RIGHT TOTAL KNEE ARTHROPLASTY;  Surgeon: Paralee Cancel, MD;  Location: [facility];  Service: Orthopedics;  Laterality: Right;  . UPPER GASTROINTESTINAL ENDOSCOPY      Outpatient Encounter Prescriptions as of 12/31/2016  Medication Sig  . ANDROGEL PUMP 20.25 MG/ACT (1.62%) GEL Apply 2 Act topically daily. On each shoulder  . atorvastatin (LIPITOR) 20 MG tablet Take 1 tablet (20 mg total) by mouth daily at 6 PM.  . b complex vitamins tablet Take 1 tablet by mouth daily.   . carvedilol (COREG) 12.5 MG tablet Take 1 tablet (12.5 mg total) by mouth 2 (two) times daily with a meal.  . Coenzyme Q10 (CO Q 10) 100 MG CAPS Take 1 capsule by mouth daily.   . diclofenac sodium (VOLTAREN) 1 % GEL APPLY 2 GRAMS TO AFFECTED AREA 2 TO 4 TIMES DAILY PRN  . fish oil-omega-3 fatty acids 1000 MG capsule Take 1 g by mouth daily.   . folic acid (FOLVITE) 397 MCG tablet Take 400 mcg by mouth daily.   . Glucosamine-Chondroitin 1500-1200 MG/30ML LIQD Take 1 tablet by mouth daily.   . hydroxypropyl methylcellulose / hypromellose (ISOPTO TEARS / GONIOVISC) 2.5 % ophthalmic solution Place 1 drop into both eyes daily as needed for dry eyes.  Marland Kitchen losartan (COZAAR) 100 MG tablet Take 1 tablet (100 mg total) by mouth daily.  Marland Kitchen MAGNESIUM PO Take 1 tablet by mouth every other day.   . Multiple Vitamin (MULTIVITAMIN) capsule Take 1 capsule by mouth daily.    . multivitamin-lutein (OCUVITE-LUTEIN) CAPS capsule Take 1 capsule by mouth daily.  . nabumetone (RELAFEN) 500 MG  tablet Take 1 tablet (500 mg total) by mouth 2 (two) times daily.  . NON FORMULARY CPAP machine  . pantoprazole (PROTONIX) 40 MG tablet TAKE 1 TABLET BY MOUTH EVERY DAY BEFORE BREAKFAST  .  POTASSIUM PO Take 1 tablet by mouth every other day.   . Probiotic Product (PROBIOTIC DAILY PO) Take 1 capsule by mouth daily.   . tamsulosin (FLOMAX) 0.4 MG CAPS capsule Take 0.4 mg by mouth at bedtime.  . traMADol (ULTRAM) 50 MG tablet Take 1 tablet (50 mg total) by mouth 3 (three) times daily as needed.  . TURMERIC PO Take 1 tablet by mouth daily.    No facility-administered encounter medications on file as of 12/31/2016.     Allergies  Allergen Reactions  . Sulfamethoxazole-Trimethoprim Rash       . Sulfonamide Derivatives Rash         Immunization History  Administered Date(s) Administered  . Influenza Split 03/21/2011  . Influenza Whole 04/06/2009, 04/09/2010  . Influenza, High Dose Seasonal PF 04/15/2016  . Influenza,inj,Quad PF,36+ Mos 03/24/2014  . Influenza-Unspecified 04/20/2015  . Pneumococcal Conjugate-13 11/29/2014  . Tdap 11/29/2014  . Zoster 05/20/2006    Current Medications, Allergies, Past Medical History, Past Surgical History, Family History, and Social History were reviewed in [employer] record.     Review of Systems       See HPI - all other systems neg except as noted...       The patient complains of dyspnea on exertion.  The patient denies anorexia, fever, weight loss, weight gain, vision loss, decreased hearing, hoarseness, chest pain, syncope, peripheral edema, prolonged cough, headaches, hemoptysis, abdominal pain, melena, hematochezia, severe indigestion/heartburn, hematuria, incontinence, muscle weakness, suspicious skin lesions, transient blindness, difficulty walking, depression, unusual weight change, abnormal bleeding, enlarged lymph nodes, and angioedema.     Objective:   Physical Exam      WD, WN, 71 y/o WM in NAD... GENERAL:  Alert & oriented; pleasant & cooperative. HEENT:  [place]/AT, EOM-wnl, PERRLA, EACs-clear, TMs-wnl, NOSE-clear, THROAT-clear & cough w/ bl-streaked sput NECK:  Supple w/ fairROM; no JVD; normal  carotid impulses w/o bruits; no thyromegaly or nodules palpated; no lymphadenopathy. CHEST:  Clear to P & A; without wheezes/ rales/ or rhonchi. HEART:  Regular Rhythm; without murmurs/ rubs/ or gallops. ABDOMEN:  Soft & nontender; normal bowel sounds; no organomegaly or masses detected. EXT: s/p R-TKR w/ pain/ swelling/ decr ROM etc; venous insuffic/ edema/ but Dopplers neg for DVT +Bakers cyst... NEURO:  CN's intact; motor testing normal; sensory testing normal; gait normal & balance OK. DERM:  No lesions noted; no rash etc...  RADIOLOGY DATA:  Reviewed in the EPIC EMR & discussed w/ the patient...  LABORATORY DATA:  Reviewed in the EPIC EMR & discussed w/ the patient...   Assessment & Plan:    DYSPNEA >> suspect multifactorial w/ deconditioning & cardiomyopathy as major factors... 10/23/16>   I explained to Jeremi that in my experience that this type of DOE is most often multifactorial- pulm factors do not appear to be playing much of a roll here but I suspect that DECONDITIONING is major factor along w/ his cardiomyopathy;  We reviewed diet & exercise recommendations + need for Cards f/u w/ up-dated 2DEcho, perhaps Cardiac Rehab might be of benefit... 12/31/16>   I think the majority of his worsening dyspnea is prob related to his progressive cardiomyopathy & chr sys CHF, with components from deconditioning, Low-T (treated w/ androgel), etc;  He has an  upcoming f/u w/ Cards- DrCooper to discuss results- perhaps referral to the CHF clinic would be beneficial, and consideration of Cardiac Rehab (again- he did it in 2006) might be helpful;  [facility]  He has a planned 10d trip to [place] coming up in Sept...   HEMOPTYSIS & prob CAP Jan2018>> eval is discordant & we discussed Rx w/ Levaquin, Pred taper, Hycodan, short-term ROV recheck... 06/19/16>   Resolved...   OSA>  He uses his CPAP compliantly w/o problems or daytime  hypersomnolence...  HBP>  Controlled on Coreg & Losartan; continue same...  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  HYPERLIPID>  Stable on Lip20, Fish Oil, etc; needs better low fat diet, he has DrCooper check his labs...  GI> HH, Hx stricture, s/p removal of duod adenoma>  Followed by DrJacobs...  GI> Hx colon polyps>  F/u EGD & colon done 1/16 by DrJacobs...  GU> BPH, Low-T, ED>  Followed & managed by DrTannenbaum on Androgel & Flomax  DJD- knees and LBP>  He has seen DrDeveshwar for Rheum, DrOlin for Ortho  & DrCram for NS... ~  02/2016:  He had R-TKR w/ painful recoup period; Ven dopplers neg for DVT but + for Baker's cyst...    Patient's Medications  New Prescriptions   No medications on file  Previous Medications   ANDROGEL PUMP 20.25 MG/ACT (1.62%) GEL    Apply 2 Act topically daily. On each shoulder   ATORVASTATIN (LIPITOR) 20 MG TABLET    Take 1 tablet (20 mg total) by mouth daily at 6 PM.   B COMPLEX VITAMINS TABLET    Take 1 tablet by mouth daily.    CARVEDILOL (COREG) 12.5 MG TABLET    Take 1 tablet (12.5 mg total) by mouth 2 (two) times daily with a meal.   COENZYME Q10 (CO Q 10) 100 MG CAPS    Take 1 capsule by mouth daily.    DICLOFENAC SODIUM (VOLTAREN) 1 % GEL    APPLY 2 GRAMS TO AFFECTED AREA 2 TO 4 TIMES DAILY PRN   FISH OIL-OMEGA-3 FATTY ACIDS 1000 MG CAPSULE    Take 1 g by mouth daily.    FOLIC ACID (FOLVITE) 413 MCG TABLET    Take 400 mcg by mouth daily.    GLUCOSAMINE-CHONDROITIN 1500-1200 MG/30ML LIQD    Take 1 tablet by mouth daily.    HYDROXYPROPYL METHYLCELLULOSE / HYPROMELLOSE (ISOPTO TEARS / GONIOVISC) 2.5 % OPHTHALMIC SOLUTION    Place 1 drop into both eyes daily as needed for dry eyes.   LOSARTAN (COZAAR) 100 MG TABLET    Take 1 tablet (100 mg total) by mouth daily.   MAGNESIUM PO    Take 1 tablet by mouth every other day.     MULTIPLE VITAMIN (MULTIVITAMIN) CAPSULE    Take 1 capsule by mouth daily.     MULTIVITAMIN-LUTEIN (OCUVITE-LUTEIN) CAPS CAPSULE    Take 1 capsule by mouth daily.   NABUMETONE (RELAFEN) 500 MG TABLET    Take 1 tablet (500 mg total) by mouth 2 (two) times daily.   NON FORMULARY    CPAP machine   PANTOPRAZOLE (PROTONIX) 40 MG TABLET    TAKE 1 TABLET BY MOUTH EVERY DAY BEFORE BREAKFAST   POTASSIUM PO    Take 1 tablet by mouth every other day.  PROBIOTIC PRODUCT (PROBIOTIC DAILY PO)    Take 1 capsule by mouth daily.    TAMSULOSIN (FLOMAX) 0.4 MG CAPS CAPSULE    Take 0.4 mg by mouth at bedtime.   TRAMADOL (ULTRAM) 50 MG TABLET    Take 1 tablet (50 mg total) by mouth 3 (three) times daily as needed.   TURMERIC PO    Take 1 tablet by mouth daily.   Modified Medications   No medications on file  Discontinued Medications   No medications on file

## 2016-12-31 NOTE — Patient Instructions (Signed)
Today we updated your med list in our EPIC system...    Continue your current medications the same...  Continue to try to exercise the best that you can, consider water exercise/ swimming, we will check w/ DrCooper to see if he feels a stint in Cardiac Rehab might be helpful...  I will text DrCooper regarding your recent stress test and 2DEcho results, perhaps referral the the Cardiolofy CHF clinic would be helpful.  Be careful in [place] during your up coming trip, take your time w/ activities and don't overexert...  Call for any questions...  Let's plan a follow up visit in 20mo, sooner if needed for problems.Marland KitchenMarland Kitchen

## 2017-01-06 DIAGNOSIS — D225 Melanocytic nevi of trunk: Secondary | ICD-10-CM | POA: Diagnosis not present

## 2017-01-06 DIAGNOSIS — D692 Other nonthrombocytopenic purpura: Secondary | ICD-10-CM | POA: Diagnosis not present

## 2017-01-06 DIAGNOSIS — L820 Inflamed seborrheic keratosis: Secondary | ICD-10-CM | POA: Diagnosis not present

## 2017-01-06 DIAGNOSIS — D485 Neoplasm of uncertain behavior of skin: Secondary | ICD-10-CM | POA: Diagnosis not present

## 2017-01-06 DIAGNOSIS — L821 Other seborrheic keratosis: Secondary | ICD-10-CM | POA: Diagnosis not present

## 2017-01-06 DIAGNOSIS — Z85828 Personal history of other malignant neoplasm of skin: Secondary | ICD-10-CM | POA: Diagnosis not present

## 2017-01-06 DIAGNOSIS — L570 Actinic keratosis: Secondary | ICD-10-CM | POA: Diagnosis not present

## 2017-01-07 NOTE — Telephone Encounter (Signed)
Spoke with the patient. Reviewed echo findings and indication for R/L heart cath.  Will schedule for next Wednesday. Advised Lauren will touch base with him to set up. thx

## 2017-01-07 NOTE — Telephone Encounter (Signed)
I spoke with the pt and made him aware of Echocardiogram and CPX results.  Dr Burt Knack is recommending R & L cardiac catheterization.  The pt is scheduled to travel to [place] for 2 weeks and will be leaving on 01/31/17. The pt prefers to wait until October to have cath but if Dr Burt Knack thinks this should be done prior to travel then he would like to proceed with getting this arranged.  I will forward this information to Dr Burt Knack to review.

## 2017-01-08 MED ORDER — ASPIRIN EC 81 MG PO TBEC: 81.0000 mg | DELAYED_RELEASE_TABLET | Freq: Every day | ORAL | Status: DC

## 2017-01-08 NOTE — Telephone Encounter (Signed)
Discussed scheduling Cath with Crystal at the hospital and pt placed on 01/15/17 schedule at 1:00 PM.  Pt will need to arrive at 10:30 AM and labs will need to be drawn on arrival. I spoke with the pt by phone and made him aware of pre-procedure instructions. I have also sent instructions to the pt through My Chart.    [facility] [address], [place] [address] [place] [address] Dept: [phone] Loc: [phone]  ANDRELL BERGESON  01/08/2017  You are scheduled for a Cardiac Catheterization on Wednesday, August 29 with Dr. [name].  1. Please arrive at the [facility] (Main Entrance A) at [facility]: [address] [place], [place] [address] at 10:30 AM (two hours before your procedure to ensure your preparation). Free valet parking service is available.   Special note: Every effort is made to have your procedure done on time. Please understand that emergencies sometimes delay scheduled procedures.  2. Diet: Do not eat or drink anything after midnight prior to your procedure except sips of water to take medications.  3. Labs: Your labs will be performed at the hospital after you arrive for your procedure.  4. Medication instructions in preparation for your procedure:  On the morning of your procedure, take your morning medicines.  You may use sips of water. Please take an Aspirin 81mg  the day of procedure.  5. Plan for one night stay--bring personal belongings.  6. Bring a current list of your medications and current insurance cards.  7. You MUST have a responsible person to drive you home.  8. Someone MUST be with you the first 24 hours after you arrive home or your discharge will be delayed.  9. Please wear clothes that are easy to get on and off and wear slip-on shoes.  Thank you for allowing [place] to care for you!   -- [facility]

## 2017-01-13 ENCOUNTER — Telehealth: Payer: Self-pay

## 2017-01-13 NOTE — Telephone Encounter (Signed)
Patient contacted pre-catheterization at [facility] scheduled for:  01/15/2017 @ 1300 Verified arrival time and place:  NT @ 1030 Confirmed AM meds to be taken pre-cath with sip of water: Take ASA Confirmed patient has responsible person to drive home post procedure and observe patient for 24 hours:  yes Addl concerns:  None noted

## 2017-01-14 DIAGNOSIS — M5136 Other intervertebral disc degeneration, lumbar region: Secondary | ICD-10-CM | POA: Diagnosis not present

## 2017-01-15 ENCOUNTER — Ambulatory Visit (HOSPITAL_COMMUNITY)
Admission: RE | Admit: 2017-01-15 | Discharge: 2017-01-15 | Disposition: A | Discharge status: Home / Self Care | Payer: Medicare Other | Source: Ambulatory Visit | Attending: Cardiovascular Disease | Admitting: Cardiovascular Disease

## 2017-01-15 ENCOUNTER — Ambulatory Visit (HOSPITAL_COMMUNITY)
Admission: RE | Disposition: A | Discharge status: Home / Self Care | Payer: Self-pay | Source: Ambulatory Visit | Attending: Cardiovascular Disease

## 2017-01-15 DIAGNOSIS — Z79899 Other long term (current) drug therapy: Secondary | ICD-10-CM | POA: Insufficient documentation

## 2017-01-15 DIAGNOSIS — Z955 Presence of coronary angioplasty implant and graft: Secondary | ICD-10-CM | POA: Insufficient documentation

## 2017-01-15 DIAGNOSIS — I5022 Chronic systolic (congestive) heart failure: Secondary | ICD-10-CM | POA: Insufficient documentation

## 2017-01-15 DIAGNOSIS — R252 Cramp and spasm: Secondary | ICD-10-CM | POA: Diagnosis not present

## 2017-01-15 DIAGNOSIS — Z87891 Personal history of nicotine dependence: Secondary | ICD-10-CM | POA: Insufficient documentation

## 2017-01-15 DIAGNOSIS — Z96651 Presence of right artificial knee joint: Secondary | ICD-10-CM | POA: Diagnosis not present

## 2017-01-15 DIAGNOSIS — Z7983 Long term (current) use of bisphosphonates: Secondary | ICD-10-CM | POA: Diagnosis not present

## 2017-01-15 DIAGNOSIS — G4733 Obstructive sleep apnea (adult) (pediatric): Secondary | ICD-10-CM | POA: Diagnosis not present

## 2017-01-15 DIAGNOSIS — I429 Cardiomyopathy, unspecified: Secondary | ICD-10-CM | POA: Diagnosis not present

## 2017-01-15 DIAGNOSIS — I2510 Atherosclerotic heart disease of native coronary artery without angina pectoris: Secondary | ICD-10-CM | POA: Insufficient documentation

## 2017-01-15 DIAGNOSIS — I110 Hypertensive heart disease with heart failure: Secondary | ICD-10-CM | POA: Insufficient documentation

## 2017-01-15 DIAGNOSIS — E785 Hyperlipidemia, unspecified: Secondary | ICD-10-CM | POA: Diagnosis not present

## 2017-01-15 DIAGNOSIS — I10 Essential (primary) hypertension: Secondary | ICD-10-CM | POA: Insufficient documentation

## 2017-01-15 DIAGNOSIS — I452 Bifascicular block: Secondary | ICD-10-CM | POA: Insufficient documentation

## 2017-01-15 DIAGNOSIS — M1990 Unspecified osteoarthritis, unspecified site: Secondary | ICD-10-CM | POA: Diagnosis not present

## 2017-01-15 HISTORY — PX: RIGHT/LEFT HEART CATH AND CORONARY ANGIOGRAPHY: CATH118266

## 2017-01-15 LAB — POCT I-STAT 3, ART BLOOD GAS (G3+)
ACID-BASE DEFICIT: 2 mmol/L (ref 0.0–2.0)
BICARBONATE: 23.3 mmol/L (ref 20.0–28.0)
O2 SAT: 72 %
PO2 ART: 39 mmHg — AB (ref 83.0–108.0)
TCO2: 24 mmol/L (ref 22–32)
pCO2 arterial: 39.1 mmHg (ref 32.0–48.0)
pH, Arterial: 7.383 (ref 7.350–7.450)

## 2017-01-15 LAB — CBC
HEMATOCRIT: 43.7 % (ref 39.0–52.0)
HEMOGLOBIN: 14.4 g/dL (ref 13.0–17.0)
MCH: 29.9 pg (ref 26.0–34.0)
MCHC: 33 g/dL (ref 30.0–36.0)
MCV: 90.7 fL (ref 78.0–100.0)
Platelets: 224 10*3/uL (ref 150–400)
RBC: 4.82 MIL/uL (ref 4.22–5.81)
RDW: 14 % (ref 11.5–15.5)
WBC: 6.1 10*3/uL (ref 4.0–10.5)

## 2017-01-15 LAB — POCT I-STAT 3, VENOUS BLOOD GAS (G3P V)
Acid-base deficit: 2 mmol/L (ref 0.0–2.0)
Bicarbonate: 22.5 mmol/L (ref 20.0–28.0)
O2 SAT: 94 %
PCO2 VEN: 36.4 mmHg — AB (ref 44.0–60.0)
PO2 VEN: 71 mmHg — AB (ref 32.0–45.0)
TCO2: 24 mmol/L (ref 22–32)
pH, Ven: 7.4 (ref 7.250–7.430)

## 2017-01-15 LAB — BASIC METABOLIC PANEL
Anion gap: 6 (ref 5–15)
BUN: 14 mg/dL (ref 6–20)
CHLORIDE: 111 mmol/L (ref 101–111)
CO2: 23 mmol/L (ref 22–32)
CREATININE: 0.94 mg/dL (ref 0.61–1.24)
Calcium: 9.1 mg/dL (ref 8.9–10.3)
GFR calc non Af Amer: >60 mL/min (ref >60)
Glucose, Bld: 107 mg/dL — ABNORMAL HIGH (ref 65–99)
Potassium: 4.1 mmol/L (ref 3.5–5.1)
Sodium: 140 mmol/L (ref 135–145)

## 2017-01-15 LAB — PROTIME-INR
INR: 1.02
Prothrombin Time: 13.3 seconds (ref 11.4–15.2)

## 2017-01-15 SURGERY — RIGHT/LEFT HEART CATH AND CORONARY ANGIOGRAPHY
Anesthesia: LOCAL

## 2017-01-15 MED ORDER — IOPAMIDOL (ISOVUE-370) INJECTION 76%
INTRAVENOUS | Status: DC | PRN
  Administered 2017-01-15: 40 mL via INTRAVENOUS

## 2017-01-15 MED ORDER — FENTANYL CITRATE (PF) 100 MCG/2ML IJ SOLN
INTRAMUSCULAR | Status: AC
  Filled 2017-01-15: qty 2

## 2017-01-15 MED ORDER — SODIUM CHLORIDE 0.9% FLUSH: 3.0000 mL | Freq: Two times a day (BID) | INTRAVENOUS | Status: DC

## 2017-01-15 MED ORDER — HEPARIN SODIUM (PORCINE) 1000 UNIT/ML IJ SOLN
INTRAMUSCULAR | Status: DC | PRN
  Administered 2017-01-15: 4000 [IU] via INTRAVENOUS

## 2017-01-15 MED ORDER — SODIUM CHLORIDE 0.9 % WEIGHT BASED INFUSION
3.0000 mL/kg/h | INTRAVENOUS | Status: AC
  Administered 2017-01-15: 3 mL/kg/h via INTRAVENOUS

## 2017-01-15 MED ORDER — LIDOCAINE HCL (PF) 1 % IJ SOLN
INTRAMUSCULAR | Status: DC | PRN
  Administered 2017-01-15 (×2): 2 mL

## 2017-01-15 MED ORDER — ONDANSETRON HCL 4 MG/2ML IJ SOLN
4.0000 mg | Freq: Four times a day (QID) | INTRAMUSCULAR | Status: DC | PRN
Start: 2017-01-15 — End: 2017-01-15

## 2017-01-15 MED ORDER — SODIUM CHLORIDE 0.9% FLUSH: 3.0000 mL | INTRAVENOUS | Status: DC | PRN

## 2017-01-15 MED ORDER — IOPAMIDOL (ISOVUE-370) INJECTION 76%
INTRAVENOUS | Status: AC
  Filled 2017-01-15: qty 100

## 2017-01-15 MED ORDER — VERAPAMIL HCL 2.5 MG/ML IV SOLN
INTRAVENOUS | Status: DC | PRN
  Administered 2017-01-15: 16:00:00 via INTRA_ARTERIAL

## 2017-01-15 MED ORDER — MIDAZOLAM HCL 2 MG/2ML IJ SOLN
INTRAMUSCULAR | Status: DC | PRN
  Administered 2017-01-15: 2 mg via INTRAVENOUS
  Administered 2017-01-15: 0.5 mg via INTRAVENOUS

## 2017-01-15 MED ORDER — VERAPAMIL HCL 2.5 MG/ML IV SOLN
INTRAVENOUS | Status: AC
  Filled 2017-01-15: qty 2

## 2017-01-15 MED ORDER — HEPARIN (PORCINE) IN NACL 2-0.9 UNIT/ML-% IJ SOLN
INTRAMUSCULAR | Status: AC | PRN
  Administered 2017-01-15: 1000 mL

## 2017-01-15 MED ORDER — MIDAZOLAM HCL 2 MG/2ML IJ SOLN
INTRAMUSCULAR | Status: AC
  Filled 2017-01-15: qty 2

## 2017-01-15 MED ORDER — ACETAMINOPHEN 325 MG PO TABS: 650.0000 mg | ORAL_TABLET | ORAL | Status: DC | PRN

## 2017-01-15 MED ORDER — LIDOCAINE HCL 2 % IJ SOLN
INTRAMUSCULAR | Status: AC
  Filled 2017-01-15: qty 10

## 2017-01-15 MED ORDER — NITROGLYCERIN 1 MG/10 ML FOR IR/CATH LAB
INTRA_ARTERIAL | Status: AC
  Filled 2017-01-15: qty 10

## 2017-01-15 MED ORDER — ASPIRIN 81 MG PO CHEW: 81.0000 mg | CHEWABLE_TABLET | Freq: Once | ORAL | Status: DC

## 2017-01-15 MED ORDER — SODIUM CHLORIDE 0.9 % IV SOLN: 250.0000 mL | INTRAVENOUS | Status: DC | PRN

## 2017-01-15 MED ORDER — HEPARIN SODIUM (PORCINE) 1000 UNIT/ML IJ SOLN
INTRAMUSCULAR | Status: AC
  Filled 2017-01-15: qty 1

## 2017-01-15 MED ORDER — FENTANYL CITRATE (PF) 100 MCG/2ML IJ SOLN
INTRAMUSCULAR | Status: DC | PRN
  Administered 2017-01-15 (×2): 25 ug via INTRAVENOUS

## 2017-01-15 MED ORDER — SODIUM CHLORIDE 0.9 % WEIGHT BASED INFUSION: 1.0000 mL/kg/h | INTRAVENOUS | Status: DC

## 2017-01-15 MED ORDER — HEPARIN (PORCINE) IN NACL 2-0.9 UNIT/ML-% IJ SOLN
INTRAMUSCULAR | Status: AC
  Filled 2017-01-15: qty 1000

## 2017-01-15 SURGICAL SUPPLY — 11 items

## 2017-01-15 NOTE — Interval H&P Note (Signed)
History and Physical Interval Note:  01/15/2017 3:45 PM  [name]  has presented today for surgery, with the diagnosis of LV dysfunction, CAD  The various methods of treatment have been discussed with the patient and family. After consideration of risks, benefits and other options for treatment, the patient has consented to  Procedure(s): RIGHT/LEFT HEART CATH AND CORONARY ANGIOGRAPHY (N/A) as a surgical intervention .  The patient's history has been reviewed, patient examined, no change in status, stable for surgery.  I have reviewed the patient's chart and labs.  Questions were answered to the patient's satisfaction.     [name]

## 2017-01-15 NOTE — H&P (Signed)
Cardiology Office Note Date:  11/29/2016   ID:  CACHE BILLS, DOB 11-04-1945, MRN [id number]  PCP:  [name], MD       Cardiologist:  [name], MD                  Chief Complaint  Patient presents with  . Shortness of Breath     History of Present Illness: [name] is a 71 y.o. male who presents for follow-up of chronic systolic heart failure. The patient has a history of coronary artery disease with stenting of the RCA, left bundle branch block, and cardiomyopathy. He has been followed for chronic dyspnea.  The patient is here alone today. He complains of progressive shortness of breath and fatigue. He has no short of breath with fairly low level activities. He complains of dyspnea with walking short distances, carrying anything heavy, and with sex. He denies orthopnea or PND. He's had no chest pain or pressure. He uses CPAP at night time. He admits to occasional leg swelling. No cough or wheezing.      Past Medical History:  Diagnosis Date  . Allergic rhinitis   . Anginal pain ([place]) 05/26/14   chest pain after chasing dog  . CAD (coronary artery disease)    hx of stent- 2005 RCA  . CHF (congestive heart failure) ([place])   . Chronic back pain    intermittent  . Constipation   . Dizziness   . Dysrhythmia    right bundle branch block   . Esophageal stricture   . GERD (gastroesophageal reflux disease)   . Hemoptysis   . Hiatal hernia   . History of colonic polyps    hyperplastic  . HTN (hypertension)   . Hyperlipidemia   . Hypertrophy of prostate with urinary obstruction and other lower urinary tract symptoms (LUTS)   . OA (osteoarthritis)   . OSA (obstructive sleep apnea)    cpap- 10   . 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 Pneumonia   . Shortness of breath    with exertion  . Testicular hypofunction          Past Surgical  History:  Procedure Laterality Date  . CARDIAC CATHETERIZATION     '05, last 2009, showing patent RCA stent  . COLONOSCOPY  12/2007   HYPERPLASTIC POLYP  . COLONOSCOPY WITH PROPOFOL N/A 06/02/2014   Procedure: COLONOSCOPY WITH PROPOFOL;  Surgeon: [name], MD;  Location: [facility];  Service: Endoscopy;  Laterality: N/A;  . CORONARY ANGIOPLASTY  08/2003  . ESOPHAGOGASTRODUODENOSCOPY (EGD) WITH PROPOFOL N/A 06/02/2014   Procedure: ESOPHAGOGASTRODUODENOSCOPY (EGD) WITH PROPOFOL;  Surgeon: [name], MD;  Location: [facility];  Service: Endoscopy;  Laterality: N/A;  . hernia surgery x 3     Bilateral Inguinal, Umbicial  . IRRIGATION AND DEBRIDEMENT ABSCESS Left 08/14/2012   Procedure: IRRIGATION AND DEBRIDEMENT LEFT INGUINAL BOIL ;  Surgeon: Ailene Rud, MD;  Location: [facility];  Service: Urology;  Laterality: Left;  . LEFT HEART CATHETERIZATION WITH CORONARY ANGIOGRAM N/A 05/26/2014   Procedure: LEFT HEART CATHETERIZATION WITH CORONARY ANGIOGRAM;  Surgeon: Blane Ohara, MD;  Location: [facility] CATH LAB;  Service: Cardiovascular;  Laterality: N/A;  . LUMBAR LAMINECTOMY/DECOMPRESSION MICRODISCECTOMY Left 09/26/2014   Procedure: Lumbar Laminectomy for resection of synovial cyst  Lumbar five- sacral one left;  Surgeon: Kary Kos, MD;  Location: [facility]URO ORS;  Service:  Neurosurgery;  Laterality: Left;  . Oral surg to removed growth from ?sinus  10/2010   ?Dermoid removed by DrRiggs  . RCA stenting     '05 RCA  . right toe surgery  Right    Cyst   . s/p right knee arthroscopy  2005  . TOTAL KNEE ARTHROPLASTY Right 03/12/2016   Procedure: RIGHT TOTAL KNEE ARTHROPLASTY;  Surgeon: Paralee Cancel, MD;  Location: [facility];  Service: Orthopedics;  Laterality: Right;  . UPPER GASTROINTESTINAL ENDOSCOPY            Current Outpatient Prescriptions  Medication Sig Dispense Refill  . ANDROGEL PUMP 20.25 MG/ACT (1.62%) GEL Apply 2 Act topically daily. On each shoulder     . atorvastatin (LIPITOR) 20 MG tablet Take 1 tablet (20 mg total) by mouth daily at 6 PM. 90 tablet 2  . b complex vitamins tablet Take 1 tablet by mouth daily.     . carvedilol (COREG) 12.5 MG tablet Take 1 tablet (12.5 mg total) by mouth 2 (two) times daily with a meal. 180 tablet 2  . Coenzyme Q10 (CO Q 10) 100 MG CAPS Take 1 capsule by mouth daily.     . diclofenac sodium (VOLTAREN) 1 % GEL APPLY 2 GRAMS TO AFFECTED AREA 2 TO 4 TIMES DAILY PRN  3  . fish oil-omega-3 fatty acids 1000 MG capsule Take 1 g by mouth daily.     . folic acid (FOLVITE) 254 MCG tablet Take 400 mcg by mouth daily.     . Glucosamine-Chondroitin 1500-1200 MG/30ML LIQD Take 1 tablet by mouth daily.     . hydroxypropyl methylcellulose / hypromellose (ISOPTO TEARS / GONIOVISC) 2.5 % ophthalmic solution Place 1 drop into both eyes daily as needed for dry eyes.    Marland Kitchen losartan (COZAAR) 100 MG tablet Take 1 tablet (100 mg total) by mouth daily. 90 tablet 2  . MAGNESIUM PO Take 1 tablet by mouth every other day.     . Multiple Vitamin (MULTIVITAMIN) capsule Take 1 capsule by mouth daily.      . multivitamin-lutein (OCUVITE-LUTEIN) CAPS capsule Take 1 capsule by mouth daily.    . nabumetone (RELAFEN) 500 MG tablet Take 1 tablet (500 mg total) by mouth 2 (two) times daily. 180 tablet 0  . NON FORMULARY CPAP machine    . pantoprazole (PROTONIX) 40 MG tablet TAKE 1 TABLET BY MOUTH EVERY DAY BEFORE BREAKFAST 90 tablet 3  . POTASSIUM PO Take 1 tablet by mouth every other day.     . Probiotic Product (PROBIOTIC DAILY PO) Take 1 capsule by mouth daily.     . tamsulosin (FLOMAX) 0.4 MG CAPS capsule Take 0.4 mg by mouth at bedtime.    . traMADol (ULTRAM) 50 MG tablet Take 1 tablet (50 mg total) by mouth 3 (three) times daily as needed. 270 tablet 0  . TURMERIC PO Take 1 tablet by mouth daily.      No current facility-administered medications for this visit.    Allergies:    Sulfamethoxazole-trimethoprim and Sulfonamide derivatives   Social History:  The patient  reports that he quit smoking about 26 years ago. He has never used smokeless tobacco. He reports that he drinks alcohol. He reports that he does not use drugs.   Family History:  The patient's family history includes Coronary artery disease in his father; Diabetes in his father; Heart disease in his father and mother; Hypertension in his unknown relative; Stomach cancer in his paternal grandmother; Sudden death in  his father.   ROS:  Please see the history of present illness.  Otherwise, review of systems is positive for leg swelling, visual disturbance, DOE, muscle pain, leg pain.  All other systems are reviewed and negative.    PHYSICAL EXAM: VS:  BP (!) 146/82   Pulse 81   Ht 5\' 7"  (1.702 m)   Wt 184 lb (83.5 kg)   BMI 28.82 kg/m  , BMI Body mass index is 28.82 kg/m. GEN: Well nourished, well developed, in no acute distress  HEENT: normal  Neck: no JVD, no masses. No carotid bruits Cardiac: RRR without murmur or gallop                Respiratory:  clear to auscultation bilaterally, normal work of breathing GI: soft, nontender, nondistended, + BS MS: no deformity or atrophy  Ext: no pretibial edema, pedal pulses 2+= bilaterally Skin: warm and dry, no rash Neuro:  Strength and sensation are intact Psych: euthymic mood, full affect  EKG:  EKG is ordered today. The ekg ordered today shows normal sinus rhythm with first-degree AV block and occasional PVCs, left bundle branch block. No change from previous. QRS duration 162 ms.  Recent Labs: 10/23/2016: ALT 22; BUN 22; Creatinine, Ser 0.90; Hemoglobin 14.7; Platelets 242.0; Potassium 4.2; Sodium 138; TSH 3.19 11/27/2016: Magnesium 2.1; NT-Pro BNP 276   Lipid Panel  Labs (Brief)          Component Value Date/Time   CHOL 138 11/27/2016 0914   TRIG 188 (H) 11/27/2016 0914   HDL 35 (L) 11/27/2016 0914   CHOLHDL 3.9 11/27/2016 0914    CHOLHDL 4 12/07/2015 0742   VLDL 30.6 12/07/2015 0742   LDLCALC 65 11/27/2016 0914           Wt Readings from Last 3 Encounters:  11/27/16 184 lb (83.5 kg)  10/23/16 186 lb (84.4 kg)  10/16/16 188 lb (85.3 kg)     Cardiac Studies Reviewed: 2D Echo 03-08-2015: Study Conclusions  - Left ventricle: The cavity size was normal. Wall thickness was increased in a pattern of mild LVH. Systolic function was moderately reduced. The estimated ejection fraction was in the range of 35% to 40%. Septal-lateral dyssynchrony with diffuse hypokinesis appearing worse in the septum. Doppler parameters are consistent with abnormal left ventricular relaxation (grade 1 diastolic dysfunction). - Aortic valve: There was no stenosis. There was trivial regurgitation. - Mitral valve: There was mild regurgitation. - Left atrium: The atrium was mildly dilated. - Right ventricle: The cavity size was normal. Systolic function was normal. - Tricuspid valve: Peak RV-RA gradient (S): 23 mm Hg. - Pulmonary arteries: PA peak pressure: 26 mm Hg (S). - Inferior vena cava: The vessel was normal in size. The respirophasic diameter changes were in the normal range (= 50%), consistent with normal central venous pressure.  Impressions:  - Normal LV size with mild LV hypertrophy. EF 35-40% with septal-lateral dyssynchrony and diffuse hypokinesis appearing worse in the septum. Normal RV size and systolic function. Mild MR. If ICD is being considered, could do cardiac MRI.   ASSESSMENT AND PLAN: 1.  Chronic systolic heart failure, New York Heart Association functional class IIIb. The patient's exam is benign with no evidence of volume overload. His BNP in January was normal. He does have significant LV dysfunction and a wide left bundle branch block. I reviewed his previous echo from 2016 and I think this should be updated considering his progressive symptoms. Will also check a  cardiopulmonary exercise test  to try to differentiate cardiovascular limitation from deconditioning versus pulmonary limitation. He will continue on carvedilol and losartan at current doses. If there is been further decline in LV function will consider adjustment of his medical program and/or further diagnostic studies.  2. Coronary artery disease, native vessel, without angina:the patient will continue current medications.  3. Hyperlipidemia:Will update a lipid panel today.  4. Hypertension:continue carvedilol and losartan. Consider aldactone and/or entresto pending echo review.   5. Leg cramps: advised tonic water at bedtime. Will check magnesium level. Recent labs reviewed and WNL.  Current medicines are reviewed with the patient today.  The patient does not have concerns regarding medicines.  Labs/ tests ordered today include:      Orders Placed This Encounter  Procedures  . Cardiopulmonary exercise test  . Magnesium  . Pro b natriuretic peptide  . Lipid panel  . EKG 12-Lead  . ECHOCARDIOGRAM COMPLETE    Disposition:   FU 6 months  Signed, [name], MD  11/29/2016 4:24 AM    [facility] [address], [place], [place]  [address] Phone: [phone]; Fax: [phone]   ADDENDUM: Echo demonstrates progressive and severe LV dysfunction. See below: Study Conclusions  - Left ventricle: The cavity size was mildly dilated. There was   mild concentric hypertrophy. Systolic function was moderately to   severely reduced. The estimated ejection fraction was in the   range of 30% to 35%. Wall motion was normal; there were no   regional wall motion abnormalities. Features are consistent with   a pseudonormal left ventricular filling pattern, with concomitant   abnormal relaxation and increased filling pressure (grade 2   diastolic dysfunction). Doppler parameters are consistent with   elevated ventricular end-diastolic filling pressure. -  Aortic valve: There was trivial regurgitation. - Mitral valve: There was moderate to severe regurgitation. Valve   area by pressure half-time: 1.59 cm^2. - Left atrium: The atrium was moderately dilated. - Right ventricle: Systolic function was normal. - Pulmonic valve: There was mild regurgitation. - Pulmonary arteries: Systolic pressure was mildly increased. PA   peak pressure: 36 mm Hg (S). - Inferior vena cava: The vessel was normal in size. The   respirophasic diameter changes were in the normal range (= 50%),   consistent with normal central venous pressure.  Impressions:  - When compared to the prior study from 03/08/2015 LVEF has   decreased to 30-35% with diffuse hypokinesis and significant   septal-lateral wall dyssynchrony.  Reviewed findings with patient. Have recommended right and left heart catheterization for definitive evaluation to rule out progressive severe coronary artery disease as a cause of his worsening LV function. His physical exam is unchanged. Further plans pending the result of his catheterization.  I have reviewed the risks, indications, and alternatives to cardiac catheterization, possible angioplasty, and stenting with the patient. Risks include but are not limited to bleeding, infection, vascular injury, stroke, myocardial infection, arrhythmia, kidney injury, radiation-related injury in the case of prolonged fluoroscopy use, emergency cardiac surgery, and death. The patient understands the risks of serious complication is 1-2 in 3664 with diagnostic cardiac cath and 1-2% or less with angioplasty/stenting.   [name] 01/15/2017 3:04 PM

## 2017-01-15 NOTE — Discharge Instructions (Signed)

## 2017-01-16 ENCOUNTER — Telehealth: Payer: Self-pay

## 2017-01-16 ENCOUNTER — Encounter (HOSPITAL_COMMUNITY): Payer: Self-pay | Admitting: Cardiovascular Disease

## 2017-01-16 MED FILL — Nitroglycerin IV Soln 100 MCG/ML in D5W: INTRA_ARTERIAL | Qty: 10 | Status: AC

## 2017-01-16 MED FILL — Lidocaine HCl Local Inj 2%: INTRAMUSCULAR | Qty: 10 | Status: AC

## 2017-01-16 NOTE — Telephone Encounter (Signed)
-----   Message from [name], MD sent at 01/15/2017  5:03 PM EDT ----- Joellen Jersey - this is an outpatient I cathed today. Can we stop KDur and start him on spironolactone 12.5 mg daily? Also needs FU visit with Nicki Reaper for med titration and EP Referral for CRT-D.   Nicki Reaper will probably need to switch him from losartan to entresto but trying to do one thing at a time.   He's going out of the country and will be back at end of September so anytime after 10-1 for EP eval would be good.   thx!

## 2017-01-16 NOTE — Telephone Encounter (Signed)
Left message to call back  

## 2017-01-17 MED ORDER — SPIRONOLACTONE 25 MG PO TABS: 12.5000 mg | ORAL_TABLET | Freq: Every day | ORAL | 11 refills | Status: DC

## 2017-01-17 NOTE — Telephone Encounter (Signed)
F/u message ° °Pt returning RN call. Please call back to discuss  °

## 2017-01-17 NOTE — Telephone Encounter (Signed)
Instructed patient to [place]. He states he has not taken potassium in a long time (med list updated). Instructed him to START SPIRONOLACTONE 12.5 mg daily. He is out of town now and states he will not start the medication until he returns next Wednesday. He is then leaving to go out of the country 9/14 to 9/24. Scheduled patient with Richardson Dopp, PA for med titration on 9/26. Also scheduled with EP 9/26 per patient request. He was grateful for call and agrees with treatment plan.

## 2017-01-17 NOTE — Telephone Encounter (Signed)
New message     Pt is following up with Valetta Fuller on a message she left for him to call back

## 2017-02-11 DIAGNOSIS — I420 Dilated cardiomyopathy: Secondary | ICD-10-CM | POA: Insufficient documentation

## 2017-02-11 NOTE — Progress Notes (Signed)
Cardiology Office Note:    Date:  02/12/2017   ID:  [name], DOB 1946/01/18, MRN [id number]  PCP:  [name], MD  Cardiologist:  Dr. [name]    Referring MD: [name], MD   Chief Complaint  Patient presents with  . Congestive Heart Failure    Follow up    History of Present Illness:    YOUSUF AGER is a 71 y.o. male with a hx of chronic systolic HF, dilated CM, CAD s/p prior PCI to the RCA, LBBB, HTN, HL, OSA.  He was seen in 7/18 with complaints of worsening shortness of breath.  Echo demonstrated worsening LVF with EF 30-35 and CPX demonstrated limitation from mild HF, obesity and restrictive lung physiology.  He was set up for R/L Cardiac Catheterization that demonstrated patent RCA stent, mild non-obs CAD elsewhere and well compensate filling pressures.  The patient was started on Spironolactone.  He has also been referred to EP for CRT-D.  Mr. Maselli returns for follow up on congestive heart failure.  He is here alone.  His symptoms are unchanged.  He notes dyspnea on exertion with minimal exertion.  He continues to note chest pain described as tightness with exertion.  This symptom is unchanged.  He denies syncope, paroxysmal nocturnal dyspnea.  He has mild pedal edema that is unchanged.    Prior CV studies:   The following studies were reviewed today:  R/L Cardiac Catheterization 02/15/17 LAD mid 30 LCx irregs RCA mid stent patent LVEDP 5, mean PCWP 6, mean PA 17  CPX 12/04/16 CPX testing reveals a low normal pVO2 in the setting of a mildly submaximal test. The patient's limitation seems multifactorial due to a combination of mild HF, obesity and restrictive lung physiology. There are also frequent PVCs. Would recommend exercise training program and weight loss. Consider 48-hour Holter monitor to quantify PVC burden.  Echocardiogram 12/03/16 Mild conc LVH, EF 30-35, no RWMA, Gr 2 DD, trivial AI, mod to severe MR, mod LAE, normal RVSF, mild PI, PASP  36  Echo 03/08/15 Mild LVH, EF 35-40, Gr 1 DD, mild MR, mild LAE, normal RVSF, PASP 26  Cardiac MRI 08/07/11 1)    Mild LVE with abnormal septal motion EF 48% 2)    No infarct or scar on hyperenhancement images 3)    Mild LAE 4)    Normal right sided cardiac chambers   Past Medical History:  Diagnosis Date  . Allergic rhinitis   . Anginal pain ([place]) 05/26/14   chest pain after chasing dog  . CAD (coronary artery disease)    hx of stent- 2005 RCA  . CHF (congestive heart failure) ([place])   . Chronic back pain    intermittent  . Constipation   . Dizziness   . Dysrhythmia    right bundle branch block   . Esophageal stricture   . GERD (gastroesophageal reflux disease)   . Hemoptysis   . Hiatal hernia   . History of colonic polyps    hyperplastic  . HTN (hypertension)   . Hyperlipidemia   . Hypertrophy of prostate with urinary obstruction and other lower urinary tract symptoms (LUTS)   . OA (osteoarthritis)   . OSA (obstructive sleep apnea)    cpap- 10   . 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 Pneumonia   . Shortness of breath    with exertion  .  Testicular hypofunction     Past Surgical History:  Procedure Laterality Date  . CARDIAC CATHETERIZATION     '05, last 2009, showing patent RCA stent  . COLONOSCOPY  12/2007   HYPERPLASTIC POLYP  . COLONOSCOPY WITH PROPOFOL N/A 06/02/2014   Procedure: COLONOSCOPY WITH PROPOFOL;  Surgeon: [name], MD;  Location: [facility];  Service: Endoscopy;  Laterality: N/A;  . CORONARY ANGIOPLASTY  08/2003  . ESOPHAGOGASTRODUODENOSCOPY (EGD) WITH PROPOFOL N/A 06/02/2014   Procedure: ESOPHAGOGASTRODUODENOSCOPY (EGD) WITH PROPOFOL;  Surgeon: [name], MD;  [facility]: [facility];  Service: Endoscopy;  Laterality: N/A;  . hernia surgery x 3     Bilateral Inguinal, Umbicial  . IRRIGATION AND DEBRIDEMENT ABSCESS Left 08/14/2012   Procedure:  IRRIGATION AND DEBRIDEMENT LEFT INGUINAL BOIL ;  Surgeon: Ailene Rud, MD;  Location: [facility];  Service: Urology;  Laterality: Left;  . LEFT HEART CATHETERIZATION WITH CORONARY ANGIOGRAM N/A 05/26/2014   Procedure: LEFT HEART CATHETERIZATION WITH CORONARY ANGIOGRAM;  Surgeon: Blane Ohara, MD;  Location: [facility] CATH LAB;  Service: Cardiovascular;  Laterality: N/A;  . LUMBAR LAMINECTOMY/DECOMPRESSION MICRODISCECTOMY Left 09/26/2014   Procedure: Lumbar Laminectomy for resection of synovial cyst  Lumbar five- sacral one left;  Surgeon: Kary Kos, MD;  Location: [facility];  Service: Neurosurgery;  Laterality: Left;  . Oral surg to removed growth from ?sinus  10/2010   ?Dermoid removed by DrRiggs  . RCA stenting     '05 RCA  . right toe surgery  Right    Cyst   . RIGHT/LEFT HEART CATH AND CORONARY ANGIOGRAPHY N/A 01/15/2017   Procedure: RIGHT/LEFT HEART CATH AND CORONARY ANGIOGRAPHY;  Surgeon: [name], MD;  Location: [facility];  Service: Cardiovascular;  Laterality: N/A;  . s/p right knee arthroscopy  2005  . TOTAL KNEE ARTHROPLASTY Right 03/12/2016   Procedure: RIGHT TOTAL KNEE ARTHROPLASTY;  Surgeon: Paralee Cancel, MD;  [facility]: [facility];  Service: Orthopedics;  Laterality: Right;  . UPPER GASTROINTESTINAL ENDOSCOPY      Current Medications: Current Meds  Medication Sig  . ANDROGEL PUMP 20.25 MG/ACT (1.62%) GEL Apply 2 Pump topically daily. On each shoulder  . aspirin EC 81 MG tablet Take 1 tablet (81 mg total) by mouth daily.  Marland Kitchen atorvastatin (LIPITOR) 20 MG tablet Take 1 tablet (20 mg total) by mouth daily at 6 PM.  . b complex vitamins tablet Take 1 tablet by mouth daily.   . calcium carbonate (OSCAL) 1500 (600 Ca) MG TABS tablet Take 600 mg by mouth every evening.  . carvedilol (COREG) 12.5 MG tablet Take 1 tablet (12.5 mg total) by mouth 2 (two) times daily with a meal.  . Coenzyme Q10 (CO Q 10) 100 MG CAPS Take 1 capsule by mouth daily.   . diclofenac sodium  (VOLTAREN) 1 % GEL APPLY 2 GRAMS TO AFFECTED AREA 2 TO 4 TIMES DAILY PRN  . folic acid (FOLVITE) 102 MCG tablet Take 400 mcg by mouth daily.   . Glucosamine HCl (GLUCOSAMINE PO) Take 1,500 mg by mouth every evening.  . hydroxypropyl methylcellulose / hypromellose (ISOPTO TEARS / GONIOVISC) 2.5 % ophthalmic solution Place 1 drop into both eyes 4 (four) times daily as needed for dry eyes.   Marland Kitchen MAGNESIUM PO Take 1 tablet by mouth daily as needed (for cramping).   . Multiple Vitamin (MULTIVITAMIN) capsule Take 1 capsule by mouth daily.    . Multiple Vitamins-Minerals (OCUVITE PO) Take 1 tablet by mouth at bedtime.  . nabumetone (  RELAFEN) 500 MG tablet Take 1 tablet (500 mg total) by mouth 2 (two) times daily.  . NON FORMULARY CPAP machine  . Omega-3 Fatty Acids (FISH OIL) 500 MG CAPS Take 500 mg by mouth every evening.  . pantoprazole (PROTONIX) 40 MG tablet TAKE 1 TABLET BY MOUTH EVERY DAY BEFORE BREAKFAST  . Probiotic Product (PROBIOTIC DAILY PO) Take 1 capsule by mouth daily with lunch.   . Saw Palmetto, Serenoa repens, (SAW PALMETTO PO) Take 1 capsule by mouth every evening.  Marland Kitchen spironolactone (ALDACTONE) 25 MG tablet Take 0.5 tablets (12.5 mg total) by mouth daily.  . tamsulosin (FLOMAX) 0.4 MG CAPS capsule Take 0.4 mg by mouth at bedtime.  . traMADol (ULTRAM) 50 MG tablet Take 50 mg by mouth every 6 (six) hours as needed (pain).  . TURMERIC PO Take 1 tablet by mouth daily with lunch.   . [DISCONTINUED] losartan (COZAAR) 100 MG tablet Take 100 mg by mouth daily. With lunch     Allergies:   Sulfamethoxazole-trimethoprim and Sulfonamide derivatives   Social History   Social History  . Marital status: Married    Spouse name: N/A  . Number of children: 1  . Years of education: N/A   Occupational History  . Charity fundraiser   .  [employer]   Social History Main Topics  . Smoking status: Former Smoker    Quit date: 05/20/1990  . Smokeless tobacco: Never Used     Comment:  previous 30 pack year history  . Alcohol use Yes     Comment: rare  . Drug use: No  . Sexual activity: Not Asked   Other Topics Concern  . None   Social History Narrative  . None     Family Hx: The patient's family history includes Coronary artery disease in his father; Diabetes in his father; Heart disease in his father and mother; Hypertension in his unknown relative; Stomach cancer in his paternal grandmother; Sudden death in his father. There is no history of Colon cancer or Esophageal cancer.  ROS:   Please see the history of present illness.    Review of Systems  Cardiovascular: Positive for chest pain, dyspnea on exertion, irregular heartbeat and leg swelling.  Respiratory: Positive for shortness of breath, snoring and wheezing.   Musculoskeletal: Positive for back pain and joint pain.   All other systems reviewed and are negative.   EKGs/Labs/Other Test Reviewed:    EKG:  EKG is   ordered today.  The ekg ordered today demonstrates NSR, HR 83, LBBB  Recent Labs: 10/23/2016: ALT 22; TSH 3.19 11/27/2016: Magnesium 2.1; NT-Pro BNP 276 01/15/2017: Hemoglobin 14.4; Platelets 224 02/12/2017: BUN 18; Creatinine, Ser 0.82; Potassium 4.7; Sodium 138   Recent Lipid Panel Lab Results  Component Value Date/Time   CHOL 138 11/27/2016 09:14 AM   TRIG 188 (H) 11/27/2016 09:14 AM   HDL 35 (L) 11/27/2016 09:14 AM   CHOLHDL 3.9 11/27/2016 09:14 AM   CHOLHDL 4 12/07/2015 07:42 AM   LDLCALC 65 11/27/2016 09:14 AM    Physical Exam:    VS:  BP 130/78   Pulse 83   Ht 5\' 8"  (1.727 m)   Wt 183 lb 12.8 oz (83.4 kg)   BMI 27.95 kg/m     Wt Readings from Last 3 Encounters:  02/12/17 183 lb 8 oz (83.2 kg)  02/12/17 183 lb 12.8 oz (83.4 kg)  01/15/17 185 lb (83.9 kg)     Physical Exam  Constitutional: He is oriented  to person, place, and time. He appears well-developed and well-nourished. No distress.  HENT:  Head: Normocephalic and atraumatic.  Eyes: No scleral icterus.  Neck:  Normal range of motion. No JVD present.  Cardiovascular: Normal rate, regular rhythm, S1 normal and S2 normal.   No murmur heard. Pulmonary/Chest: Effort normal and breath sounds normal. He has no wheezes. He has no rhonchi. He has no rales.  Abdominal: Soft. There is no hepatomegaly.  Musculoskeletal: He exhibits no edema or deformity (R wrist without hematoma).  Neurological: He is alert and oriented to person, place, and time.  Skin: Skin is warm and dry.  Psychiatric: He has a normal mood and affect.    ASSESSMENT:    1. Chronic systolic heart failure ([place])   2. DCM (dilated cardiomyopathy) ([place])   3. Coronary artery disease involving native coronary artery of native heart without angina pectoris   4. Essential hypertension   5. Hyperlipidemia, unspecified hyperlipidemia type    PLAN:    In order of problems listed above:  1. Chronic systolic heart failure (HCC) NYHA 3. Volume appears stable. Recent cardiac catheterization with patent RCA stent. He has been started on spironolactone. He continues on beta blocker therapy. I have recommended that we switch his ARB to angiotensin receptor neprilysin inhibitor (Entresto).    -  Check BMET today  -  DC losartan  -  Start Entresto 49/51 mg twice a day  2. DCM (dilated cardiomyopathy) ([place]) Appointment pending today with EP to discuss possible CRT-D.  3. Coronary artery disease involving native coronary artery of native heart without angina pectoris Status post prior stent to the RCA. Recent cardiac catheterization with patent RCA stent. He has chronic discomfort likely related to congestive heart failure. Continue aspirin, statin, beta blocker.  4. Essential hypertension The patient's blood pressure is controlled on his current regimen.  Continue current therapy.    5. Hyperlipidemia, unspecified hyperlipidemia type LDL optimal on most recent lab work.  Continue current Rx.    Dispo:  Return in about 4 weeks (around 03/12/2017)  for Routine Follow Up, w/ Dr. Burt Knack, or Richardson Dopp, PA-C.   Medication Adjustments/Labs and Tests Ordered: Current medicines are reviewed at length with the patient today.  Concerns regarding medicines are outlined above.  Tests Ordered: Orders Placed This Encounter  Procedures  . Basic Metabolic Panel (BMET)  . Basic Metabolic Panel (BMET)  . EKG 12-Lead   Medication Changes: Meds ordered this encounter  Medications  . DISCONTD: sacubitril-valsartan (ENTRESTO) 49-51 MG    Sig: Take 1 tablet by mouth 2 (two) times daily.    Dispense:  180 tablet    Refill:  3  . sacubitril-valsartan (ENTRESTO) 49-51 MG    Sig: Take 1 tablet by mouth 2 (two) times daily.    Dispense:  180 tablet    Refill:  3    Signed, Richardson Dopp, PA-C  02/12/2017 4:42 PM    [facility] [place], [place], [place]  [address] Phone: [phone]; Fax: [phone]

## 2017-02-12 ENCOUNTER — Encounter: Payer: Self-pay | Admitting: Physician Assistant

## 2017-02-12 ENCOUNTER — Other Ambulatory Visit: Payer: Self-pay | Admitting: Cardiology

## 2017-02-12 ENCOUNTER — Encounter: Payer: Self-pay | Admitting: Cardiology

## 2017-02-12 ENCOUNTER — Ambulatory Visit (INDEPENDENT_AMBULATORY_CARE_PROVIDER_SITE_OTHER): Payer: Medicare Other | Admitting: Cardiology

## 2017-02-12 ENCOUNTER — Ambulatory Visit (INDEPENDENT_AMBULATORY_CARE_PROVIDER_SITE_OTHER): Payer: Medicare Other | Admitting: Physician Assistant

## 2017-02-12 ENCOUNTER — Encounter: Payer: Self-pay | Admitting: *Deleted

## 2017-02-12 VITALS — BP 130/78 | HR 83 | Ht 68.0 in | Wt 183.8 lb

## 2017-02-12 VITALS — BP 132/82 | HR 70 | Ht 68.0 in | Wt 183.5 lb

## 2017-02-12 DIAGNOSIS — I5022 Chronic systolic (congestive) heart failure: Secondary | ICD-10-CM

## 2017-02-12 DIAGNOSIS — E785 Hyperlipidemia, unspecified: Secondary | ICD-10-CM | POA: Diagnosis not present

## 2017-02-12 DIAGNOSIS — I420 Dilated cardiomyopathy: Secondary | ICD-10-CM | POA: Diagnosis not present

## 2017-02-12 DIAGNOSIS — I428 Other cardiomyopathies: Secondary | ICD-10-CM

## 2017-02-12 DIAGNOSIS — I10 Essential (primary) hypertension: Secondary | ICD-10-CM

## 2017-02-12 DIAGNOSIS — I5043 Acute on chronic combined systolic (congestive) and diastolic (congestive) heart failure: Secondary | ICD-10-CM

## 2017-02-12 DIAGNOSIS — I2510 Atherosclerotic heart disease of native coronary artery without angina pectoris: Secondary | ICD-10-CM | POA: Diagnosis not present

## 2017-02-12 LAB — BASIC METABOLIC PANEL
BUN / CREAT RATIO: 22 (ref 10–24)
BUN: 18 mg/dL (ref 8–27)
CO2: 20 mmol/L (ref 20–29)
Calcium: 9.7 mg/dL (ref 8.6–10.2)
Chloride: 102 mmol/L (ref 96–106)
Creatinine, Ser: 0.82 mg/dL (ref 0.76–1.27)
GFR calc Af Amer: 102 mL/min/{1.73_m2} (ref >59)
GFR calc non Af Amer: 88 mL/min/{1.73_m2} (ref >59)
GLUCOSE: 94 mg/dL (ref 65–99)
POTASSIUM: 4.7 mmol/L (ref 3.5–5.2)
SODIUM: 138 mmol/L (ref 134–144)

## 2017-02-12 MED ORDER — SACUBITRIL-VALSARTAN 49-51 MG PO TABS: 1.0000 | ORAL_TABLET | Freq: Two times a day (BID) | ORAL | 3 refills | Status: DC

## 2017-02-12 NOTE — Progress Notes (Signed)
Electrophysiology Office Note   Date:  02/12/2017   ID:  KOLIN ERDAHL, DOB 07/24/1945, MRN [id number]  PCP:  [name], MD  Cardiologist:  Burt Knack Primary Electrophysiologist:  Erian Rosengren [name], MD    Chief Complaint  Patient presents with  . Advice Only    Chronic systolic HF     History of Present Illness: [name] is a 71 y.o. male who is being seen today for the evaluation of CHF at the request of [name], MD. Presenting today for electrophysiology evaluation. He has a history of chronic systolic heart failure, coronary artery disease status post stenting of the RCA, left bundle branch block. He also has sleep apnea and uses a CPAP at night.    Today, he denies symptoms of palpitations, chest pain, orthopnea, PND, lower extremity edema, claudication, dizziness, presyncope, syncope, bleeding, or neurologic sequela. The patient is tolerating medications without difficulties.  Main symptoms dyspnea on exertion. He says that he feels okay when he ambulates but when he has to exert himself or lift something, he gets quite short of breath. This morning he was getting dressed and had to rest for 2-3 minutes due to shortness of breath. He also has some chest discomfort when he is exerting himself.   Past Medical History:  Diagnosis Date  . Allergic rhinitis   . Anginal pain ([place]) 05/26/14   chest pain after chasing dog  . CAD (coronary artery disease)    hx of stent- 2005 RCA  . CHF (congestive heart failure) ([place])   . Chronic back pain    intermittent  . Constipation   . Dizziness   . Dysrhythmia    right bundle branch block   . Esophageal stricture   . GERD (gastroesophageal reflux disease)   . Hemoptysis   . Hiatal hernia   . History of colonic polyps    hyperplastic  . HTN (hypertension)   . Hyperlipidemia   . Hypertrophy of prostate with urinary obstruction and other lower urinary tract symptoms (LUTS)   . OA (osteoarthritis)   . OSA  (obstructive sleep apnea)    cpap- 10   . 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 Pneumonia   . Shortness of breath    with exertion  . Testicular hypofunction    Past Surgical History:  Procedure Laterality Date  . CARDIAC CATHETERIZATION     '05, last 2009, showing patent RCA stent  . COLONOSCOPY  12/2007   HYPERPLASTIC POLYP  . COLONOSCOPY WITH PROPOFOL N/A 06/02/2014   Procedure: COLONOSCOPY WITH PROPOFOL;  Surgeon: [name], MD;  [facility];  Service: Endoscopy;  Laterality: N/A;  . CORONARY ANGIOPLASTY  08/2003  . ESOPHAGOGASTRODUODENOSCOPY (EGD) WITH PROPOFOL N/A 06/02/2014   Procedure: ESOPHAGOGASTRODUODENOSCOPY (EGD) WITH PROPOFOL;  Surgeon: [name], MD;  [facility]: [facility];  Service: Endoscopy;  Laterality: N/A;  . hernia surgery x 3     Bilateral Inguinal, Umbicial  . IRRIGATION AND DEBRIDEMENT ABSCESS Left 08/14/2012   Procedure: IRRIGATION AND DEBRIDEMENT LEFT INGUINAL BOIL ;  Surgeon: Ailene Rud, MD;  [facility]: [facility];  Service: Urology;  Laterality: Left;  . LEFT HEART CATHETERIZATION WITH CORONARY ANGIOGRAM N/A 05/26/2014   Procedure: LEFT HEART CATHETERIZATION WITH CORONARY ANGIOGRAM;  Surgeon: Blane Ohara, MD;  Location: [facility] CATH LAB;  Service: Cardiovascular;  Laterality: N/A;  . LUMBAR LAMINECTOMY/DECOMPRESSION MICRODISCECTOMY Left 09/26/2014  Procedure: Lumbar Laminectomy for resection of synovial cyst  Lumbar five- sacral one left;  Surgeon: Kary Kos, MD;  Location: [facility];  Service: Neurosurgery;  Laterality: Left;  . Oral surg to removed growth from ?sinus  10/2010   ?Dermoid removed by DrRiggs  . RCA stenting     '05 RCA  . right toe surgery  Right    Cyst   . RIGHT/LEFT HEART CATH AND CORONARY ANGIOGRAPHY N/A 01/15/2017   Procedure: RIGHT/LEFT HEART CATH AND CORONARY ANGIOGRAPHY;  Surgeon: [name], MD;  Location: [facility] CV LAB;  Service: Cardiovascular;  Laterality: N/A;  . s/p right knee arthroscopy  2005  . TOTAL KNEE ARTHROPLASTY Right 03/12/2016   Procedure: RIGHT TOTAL KNEE ARTHROPLASTY;  Surgeon: Paralee Cancel, MD;  [facility]: [facility];  Service: Orthopedics;  Laterality: Right;  . UPPER GASTROINTESTINAL ENDOSCOPY       Current Outpatient Prescriptions  Medication Sig Dispense Refill  . ANDROGEL PUMP 20.25 MG/ACT (1.62%) GEL Apply 2 Pump topically daily. On each shoulder    . aspirin EC 81 MG tablet Take 1 tablet (81 mg total) by mouth daily.    Marland Kitchen atorvastatin (LIPITOR) 20 MG tablet Take 1 tablet (20 mg total) by mouth daily at 6 PM. 90 tablet 2  . b complex vitamins tablet Take 1 tablet by mouth daily.     . calcium carbonate (OSCAL) 1500 (600 Ca) MG TABS tablet Take 600 mg by mouth every evening.    . carvedilol (COREG) 12.5 MG tablet Take 1 tablet (12.5 mg total) by mouth 2 (two) times daily with a meal. 180 tablet 2  . Coenzyme Q10 (CO Q 10) 100 MG CAPS Take 1 capsule by mouth daily.     . diclofenac sodium (VOLTAREN) 1 % GEL APPLY 2 GRAMS TO AFFECTED AREA 2 TO 4 TIMES DAILY PRN  3  . folic acid (FOLVITE) 875 MCG tablet Take 400 mcg by mouth daily.     . Glucosamine HCl (GLUCOSAMINE PO) Take 1,500 mg by mouth every evening.    . hydroxypropyl methylcellulose / hypromellose (ISOPTO TEARS / GONIOVISC) 2.5 % ophthalmic solution Place 1 drop into both eyes 4 (four) times daily as needed for dry eyes.     Marland Kitchen MAGNESIUM PO Take 1 tablet by mouth daily as needed (for cramping).     . Multiple Vitamin (MULTIVITAMIN) capsule Take 1 capsule by mouth daily.      . Multiple Vitamins-Minerals (OCUVITE PO) Take 1 tablet by mouth at bedtime.    . nabumetone (RELAFEN) 500 MG tablet Take 1 tablet (500 mg total) by mouth 2 (two) times daily. 180 tablet 0  . NON FORMULARY CPAP machine    . Omega-3 Fatty Acids (FISH OIL) 500 MG CAPS Take 500 mg by mouth every evening.    . pantoprazole (PROTONIX) 40 MG tablet  TAKE 1 TABLET BY MOUTH EVERY DAY BEFORE BREAKFAST 90 tablet 3  . Probiotic Product (PROBIOTIC DAILY PO) Take 1 capsule by mouth daily with lunch.     . sacubitril-valsartan (ENTRESTO) 49-51 MG Take 1 tablet by mouth 2 (two) times daily. 180 tablet 3  . Saw Palmetto, Serenoa repens, (SAW PALMETTO PO) Take 1 capsule by mouth every evening.    Marland Kitchen spironolactone (ALDACTONE) 25 MG tablet Take 0.5 tablets (12.5 mg total) by mouth daily. 15 tablet 11  . tamsulosin (FLOMAX) 0.4 MG CAPS capsule Take 0.4 mg by mouth at bedtime.    . traMADol (ULTRAM) 50 MG tablet Take  50 mg by mouth every 6 (six) hours as needed (pain).    . TURMERIC PO Take 1 tablet by mouth daily with lunch.      No current facility-administered medications for this visit.     Allergies:   Sulfamethoxazole-trimethoprim and Sulfonamide derivatives   Social History:  The patient  reports that he quit smoking about 26 years ago. He has never used smokeless tobacco. He reports that he drinks alcohol. He reports that he does not use drugs.   Family History:  The patient's family history includes Coronary artery disease in his father; Diabetes in his father; Heart disease in his father and mother; Hypertension in his unknown relative; Stomach cancer in his paternal grandmother; Sudden death in his father.    ROS:  Please see the history of present illness.   Otherwise, review of systems is positive for SOB.   All other systems are reviewed and negative.    PHYSICAL EXAM: VS:  BP 132/82   Pulse 70   Ht 5\' 8"  (1.727 m)   Wt 183 lb 8 oz (83.2 kg)   SpO2 95%   BMI 27.90 kg/m  , BMI Body mass index is 27.9 kg/m. GEN: Well nourished, well developed, in no acute distress  HEENT: normal  Neck: no JVD, carotid bruits, or masses Cardiac: RRR; no murmurs, rubs, or gallops,no edema  Respiratory:  clear to auscultation bilaterally, normal work of breathing GI: soft, nontender, nondistended, + BS MS: no deformity or atrophy  Skin: warm and  dry Neuro:  Strength and sensation are intact Psych: euthymic mood, full affect  EKG:  EKG is ordered today. Personal review of the ekg ordered shows sinus rhythm, LBBB  Recent Labs: 10/23/2016: ALT 22; TSH 3.19 11/27/2016: Magnesium 2.1; NT-Pro BNP 276 01/15/2017: BUN 14; Creatinine, Ser 0.94; Hemoglobin 14.4; Platelets 224; Potassium 4.1; Sodium 140    Lipid Panel     Component Value Date/Time   CHOL 138 11/27/2016 0914   TRIG 188 (H) 11/27/2016 0914   HDL 35 (L) 11/27/2016 0914   CHOLHDL 3.9 11/27/2016 0914   CHOLHDL 4 12/07/2015 0742   VLDL 30.6 12/07/2015 0742   LDLCALC 65 11/27/2016 0914     Wt Readings from Last 3 Encounters:  02/12/17 183 lb 8 oz (83.2 kg)  02/12/17 183 lb 12.8 oz (83.4 kg)  01/15/17 185 lb (83.9 kg)      Other studies Reviewed: Additional studies/ records that were reviewed today include: TTE 12/03/16  Review of the above records today demonstrates:  - Left ventricle: The cavity size was mildly dilated. There was   mild concentric hypertrophy. Systolic function was moderately to   severely reduced. The estimated ejection fraction was in the   range of 30% to 35%. Wall motion was normal; there were no   regional wall motion abnormalities. Features are consistent with   a pseudonormal left ventricular filling pattern, with concomitant   abnormal relaxation and increased filling pressure (grade 2   diastolic dysfunction). Doppler parameters are consistent with   elevated ventricular end-diastolic filling pressure. - Aortic valve: There was trivial regurgitation. - Mitral valve: There was moderate to severe regurgitation. Valve   area by pressure half-time: 1.59 cm^2. - Left atrium: The atrium was moderately dilated. - Right ventricle: Systolic function was normal. - Pulmonic valve: There was mild regurgitation. - Pulmonary arteries: Systolic pressure was mildly increased. PA   peak pressure: 36 mm Hg (S). - Inferior vena cava: The vessel was  normal in  size. The   respirophasic diameter changes were in the normal range (= 50%),   consistent with normal central venous pressure.  LHC/RHC 01/05/17 1. Widely patent coronary arteries with continued patency of the stented segment in the right coronary artery 2. Mild nonobstructive coronary artery disease as detailed with mild stenosis of the proximal to mid LAD and otherwise minimal luminal irregularities 3. Well compensated right-sided cardiac hemodynamics     ASSESSMENT AND PLAN:  1.  Chronic systolic heart failure: NYHA class III. Currently on optimal medical therapy with losartan and carvedilol. Ejection fraction is been persistently decreased. He does have left bundle branch block and would thus benefit from CRT-D. Risks and benefits were discussed. Risks include bleeding, tamponade, infection, and pneumothorax. The patient understands these risks and has agreed to the procedure.  2. Coronary artery disease, native vessel, without angina: Recent catheterization showed patent stents. No changes at this time.  3. Hyperlipidemia: Continue statin  4. Hypertension: Continue losartan and carvedilol.    Current medicines are reviewed at length with the patient today.   The patient does not have concerns regarding his medicines.  The following changes were made today:  none  Labs/ tests ordered today include:  No orders of the defined types were placed in this encounter.    Disposition:   FU with Mazey Mantell 4 weeks  Signed, Ikeem Cleckler [name], MD  02/12/2017 9:40 AM     [facility] [address] [place] [address] [phone] (office) [phone] (fax)

## 2017-02-12 NOTE — Patient Instructions (Signed)
Medication Instructions:    Your physician recommends that you continue on your current medications as directed. Please refer to the Current Medication list given to you today.  --- If you need a refill on your cardiac medications before your next appointment, please call your pharmacy. ---  Labwork:  None ordered  Testing/Procedures: Your physician has recommended that you have a defibrillator inserted. An implantable cardioverter defibrillator (ICD) is a small device that is placed in your chest or, in rare cases, your abdomen. This device uses electrical pulses or shocks to help control life-threatening, irregular heartbeats that could lead the heart to suddenly stop beating (sudden cardiac arrest). Leads are attached to the ICD that goes into your heart. This is done in the hospital and usually requires an overnight stay.   [name] will scheduled you for 11/8 and call you will details  CRT or cardiac resynchronization therapy is a treatment used to correct heart failure. When you have heart failure your heart is weakened and doesn't pump as well as it should. This therapy may help reduce symptoms and improve the quality of life.  Please see the handout/brochure given to you today to get more information of the different options of therapy.  Follow-Up:  Your physician recommends that you schedule a follow-up appointment between 03/13/17 - 03/25/17 with Dr. Curt Bears for history and physical and pre procedure lab work.   Your physician recommends that you schedule a wound check appointment 10-14 days, after your procedure on 03/27/2017, with the device clinic.   Your physician recommends that you schedule a follow up appointment in 3 months, after your procedure on 03/27/2017, with Dr. Curt Bears.  Thank you for choosing [facility]!!   Trinidad Curet, RN [phone]  Any Other Special Instructions Will Be Listed Below (If Applicable).   Cardioverter Defibrillator Implantation An  implantable cardioverter defibrillator (ICD) is a small, lightweight, battery-powered device that is placed (implanted) under the skin in the chest or abdomen. Your caregiver may prescribe an ICD if:  You have had an irregular heart rhythm (arrhythmia) that originated in the lower chambers of the heart (ventricles).  Your heart has been damaged by a disease (such as coronary artery disease) or heart condition (such as a heart attack). An ICD consists of a battery that lasts several years, a small computer called a pulse generator, and wires called leads that go into the heart. It is used to detect and correct two dangerous arrhythmias: a rapid heart rhythm (tachycardia) and an arrhythmia in which the ventricles contract in an uncoordinated way (fibrillation). When an ICD detects tachycardia, it sends an electrical signal to the heart that restores the heartbeat to normal (cardioversion). This signal is usually painless. If cardioversion does not work or if the ICD detects fibrillation, it delivers a small electrical shock to the heart (defibrillation) to restart the heart. The shock may feel like a strong jolt in the chest.ICDs may be programmed to correct other problems. Sometimes, ICDs are programmed to act as another type of implantable device called a pacemaker. Pacemakers are used to treat a slow heartbeat (bradycardia). LET YOUR CAREGIVER KNOW ABOUT:  Any allergies you have.  All medicines you are taking, including vitamins, herbs, eyedrops, and over-the-counter medicines and creams.  Previous problems you or members of your family have had with the use of anesthetics.  Any blood disorders you have had.  Other health problems you have. RISKS AND COMPLICATIONS Generally, the procedure to implant an ICD is safe. However, as with  any surgical procedure, complications can occur. Possible complications associated with implanting an ICD include:  Swelling, bleeding, or bruising at the site where  the ICD was implanted.  Infection at the site where the ICD was implanted.  A reaction to medicine used during the procedure.  Nerve, heart, or blood vessel damage.  Blood clots. BEFORE THE PROCEDURE  You may need to have blood tests, heart tests, or a chest X-ray done before the day of the procedure.  Ask your caregiver about changing or stopping your regular medicines.  Make plans to have someone drive you home. You may need to stay in the hospital overnight after the procedure.  Stop smoking at least 24 hours before the procedure.  Take a bath or shower the night before the procedure. You may need to scrub your chest or abdomen with a special type of soap.  Do not eat or drink before your procedure for as long as directed by your caregiver. Ask if it is okay to take any needed medicine with a small sip of water. PROCEDURE  The procedure to implant an ICD in your chest or abdomen is usually done at a hospital in a room that has a large X-ray machine called a fluoroscope. The machine will be above you during the procedure. It will help your caregiver see your heart during the procedure. Implanting an ICD usually takes 1-3 hours. Before the procedure:   Small monitors will be put on your body. They will be used to check your heart, blood pressure, and oxygen level.  A needle will be put into a vein in your hand or arm. This is called an intravenous (IV) access tube. Fluids and medicine will flow directly into your body through the IV tube.  Your chest or abdomen will be cleaned with a germ-killing (antiseptic) solution. The area may be shaved.  You may be given medicine to help you relax (sedative).  You will be given a medicine called a local anesthetic. This medicine will make the surgical site numb while the ICD is implanted. You will be sleepy but awake during the procedure. After you are numb the procedure will begin. The caregiver will:  Make a small cut (incision). This  will make a pocket deep under your skin that will hold the pulse generator.  Guide the leads through a large blood vessel into your heart and attach them to the heart muscles. Depending on the ICD, the leads may go into one ventricle or they may go to both ventricles and into an upper chamber of the heart (atrium).  Test the ICD.  Close the incision with stitches, glue, or staples. AFTER THE PROCEDURE  You may feel pain. Some pain is normal. It may last a few days.  You may stay in a recovery area until the local anesthetic has worn off. Your blood pressure and pulse will be checked often. You will be taken to a room where your heart will be monitored.  A chest X-ray will be taken. This is done to check that the cardioverter defibrillator is in the right place.  You may stay in the hospital overnight.  A slight bump may be seen over the skin where the ICD was placed. Sometimes, it is possible to feel the ICD under the skin. This is normal.  In the months and years afterward, your caregiver will check the device, the leads, and the battery every few months. Eventually, when the battery is low, the ICD will be replaced.  This information is not intended to replace advice given to you by your health care provider. Make sure you discuss any questions you have with your health care provider.   Document Released: 01/26/2002 Document Revised: 02/24/2013 Document Reviewed: 05/25/2012 Elsevier Interactive Patient Education [employer].

## 2017-02-12 NOTE — Patient Instructions (Signed)
Medication Instructions:  1. STOP ,LOSARTAN TODAY  2. START ENTRESTO 49/51 MG 1 TABLET TWICE DAILY; IF YOU DO NOT TAKE YOUR LOSARTAN TODAY YOU WILL START ENTRESTO THIS EVENING   Labwork: 1. TODAY BMET  2. BMET TO BE DONE IN 2 WEEKS  Testing/Procedures: NONE ORDERED TODAY  Follow-Up: SCOTT WEAVER, PAC 4 WEEKS SAME DAY DR. Burt Knack IS IN THE OFFICE   Any Other Special Instructions Will Be Listed Below (If Applicable).     If you need a refill on your cardiac medications before your next appointment, please call your pharmacy.

## 2017-02-13 ENCOUNTER — Telehealth: Payer: Self-pay

## 2017-02-13 NOTE — Telephone Encounter (Signed)
**Note De-Identified [name] Obfuscation** I have done a PA on Entresto through covermymeds.

## 2017-02-14 NOTE — Telephone Encounter (Signed)
F/u Message  Reah from [place] called to inform pts entresto approved. Please call back to discuss

## 2017-02-17 NOTE — Telephone Encounter (Signed)
**Note De-identified Suzanne Kho Obfuscation** Noted  

## 2017-02-21 ENCOUNTER — Ambulatory Visit (INDEPENDENT_AMBULATORY_CARE_PROVIDER_SITE_OTHER): Payer: Medicare Other

## 2017-02-21 DIAGNOSIS — Z23 Encounter for immunization: Secondary | ICD-10-CM | POA: Diagnosis not present

## 2017-02-24 DIAGNOSIS — N401 Enlarged prostate with lower urinary tract symptoms: Secondary | ICD-10-CM | POA: Diagnosis not present

## 2017-02-24 DIAGNOSIS — R351 Nocturia: Secondary | ICD-10-CM | POA: Diagnosis not present

## 2017-02-24 DIAGNOSIS — E291 Testicular hypofunction: Secondary | ICD-10-CM | POA: Diagnosis not present

## 2017-02-25 ENCOUNTER — Telehealth: Payer: Self-pay | Admitting: Physician Assistant

## 2017-02-25 NOTE — Telephone Encounter (Signed)
New message    Pharmacy calling to make aware  of possible interactions that can happen while patient using  Entresto and Aldactone. Wants to make nurse aware Order # [id number]  Please call [phone] for questions  Pt c/o medication issue:  1. Name of Medication:  sacubitril-valsartan (ENTRESTO) 49-51 MG, and  spironolactone (ALDACTONE) 25 MG tablet 2. How are you currently taking this medication (dosage and times per day)? As prescribed  3. Are you having a reaction (difficulty breathing--STAT)? No   4. What is your medication issue? Pharmacy wants to make aware these two drugs can cause hyperkalemia

## 2017-02-25 NOTE — Telephone Encounter (Signed)
Called pharmacy back and made aware the office is aware of known drug interaction .  Pt is scheduled for BMP 10/10.  They will document information.

## 2017-02-26 ENCOUNTER — Other Ambulatory Visit: Payer: Medicare Other | Admitting: *Deleted

## 2017-02-26 ENCOUNTER — Telehealth: Payer: Self-pay | Admitting: *Deleted

## 2017-02-26 DIAGNOSIS — I5022 Chronic systolic (congestive) heart failure: Secondary | ICD-10-CM

## 2017-02-26 LAB — BASIC METABOLIC PANEL
BUN/Creatinine Ratio: 17 (ref 10–24)
BUN: 16 mg/dL (ref 8–27)
CALCIUM: 9.5 mg/dL (ref 8.6–10.2)
CO2: 21 mmol/L (ref 20–29)
Chloride: 104 mmol/L (ref 96–106)
Creatinine, Ser: 0.95 mg/dL (ref 0.76–1.27)
GFR, EST AFRICAN AMERICAN: 92 mL/min/{1.73_m2} (ref >59)
GFR, EST NON AFRICAN AMERICAN: 80 mL/min/{1.73_m2} (ref >59)
Glucose: 127 mg/dL — ABNORMAL HIGH (ref 65–99)
POTASSIUM: 4.4 mmol/L (ref 3.5–5.2)
Sodium: 139 mmol/L (ref 134–144)

## 2017-02-26 NOTE — Telephone Encounter (Signed)
-----   Message from Liliane Shi, [place] sent at 02/26/2017  4:29 PM EDT ----- Please call patient: The kidney function (BUN, Creatinine) and potassium are normal. All other parameters are within acceptable limits and no further intervention or testing required. Continue with current treatment plan. Richardson Dopp, PA-C    02/26/2017 4:29 PM

## 2017-02-26 NOTE — Telephone Encounter (Signed)
**Note De-Identified Sachi Boulay Obfuscation** Approval on [place] PA received [place] fax from [place]. Approval good until 02/13/2018.

## 2017-02-26 NOTE — Telephone Encounter (Signed)
Pt has been notified of lab results by phone with verbal understanding. Pt thanked me for my call today.   

## 2017-03-03 IMAGING — DX DG CHEST 2V
2 series · 2 of 2 positions shown · non-contrast
Comparison: 03/27/2016

CLINICAL DATA: Bronchitis with hemoptysis. Duration of symptoms 2
weeks.

EXAM:
CHEST  2 VIEW

[chest pa]
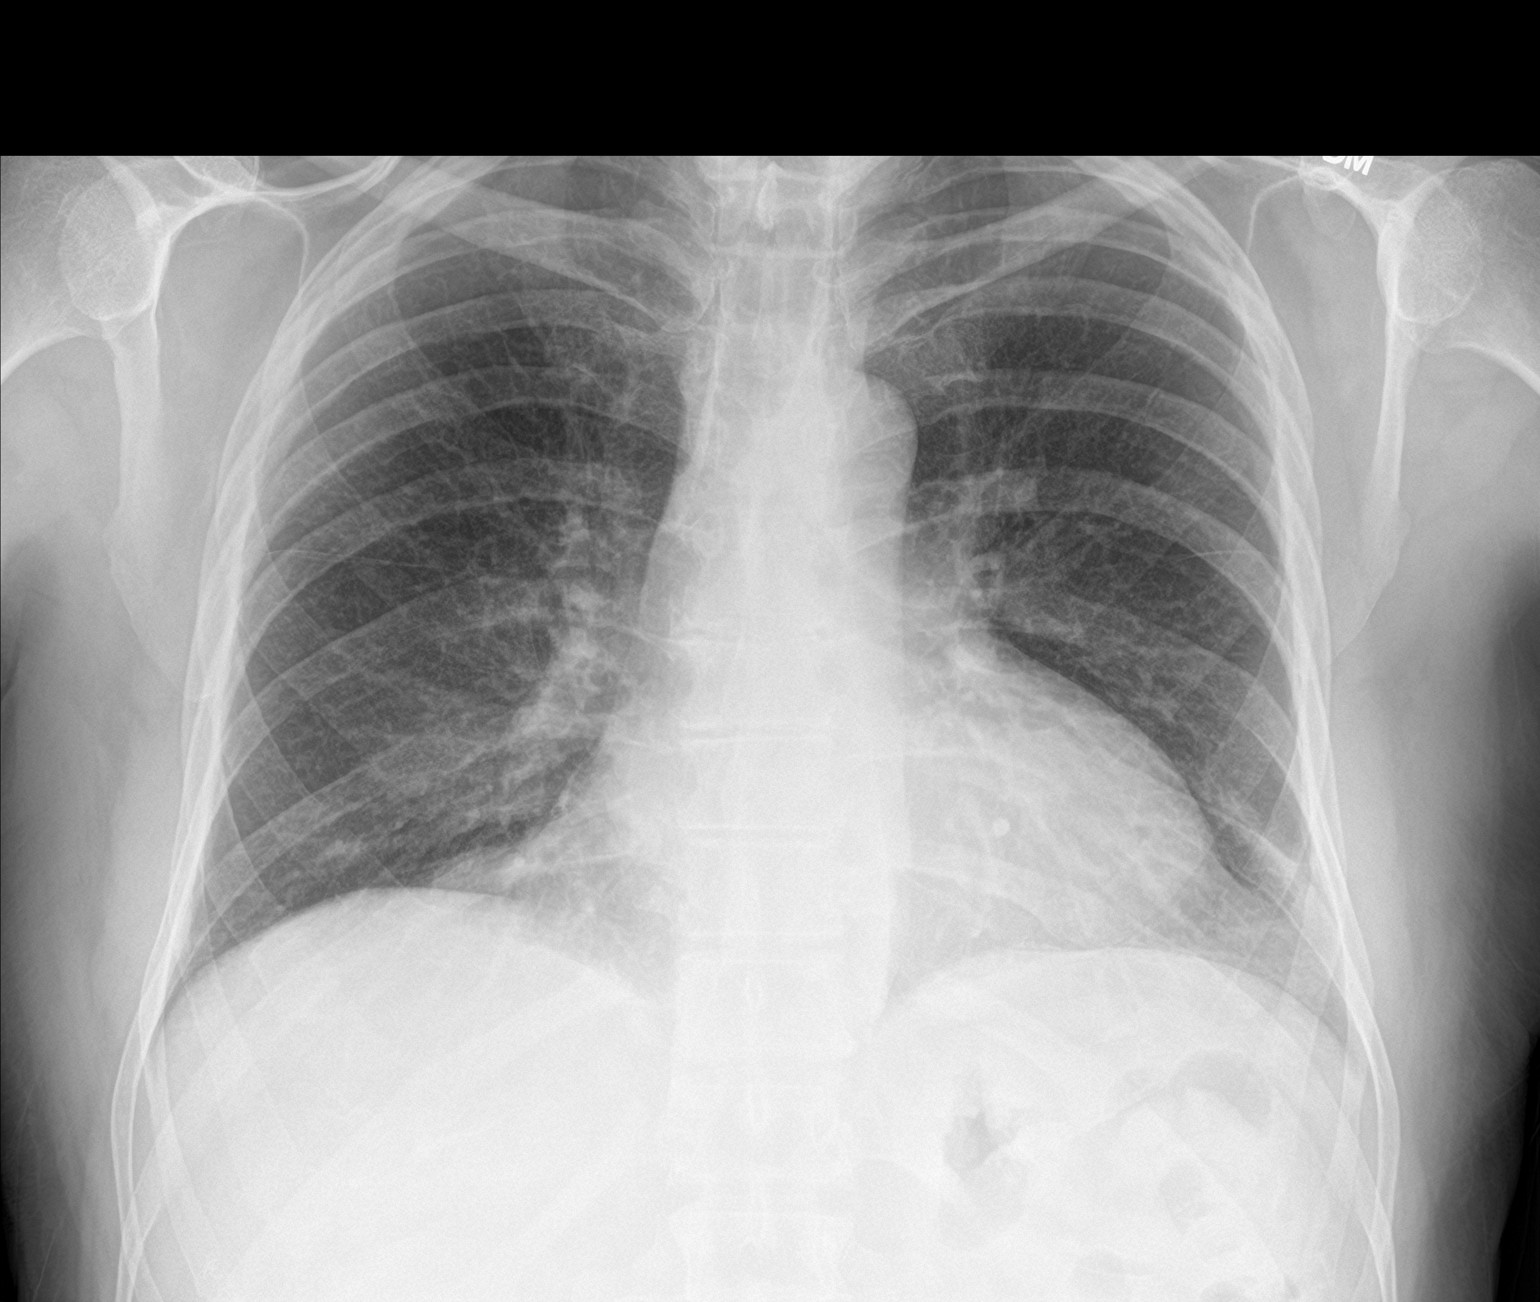

[chest lat]
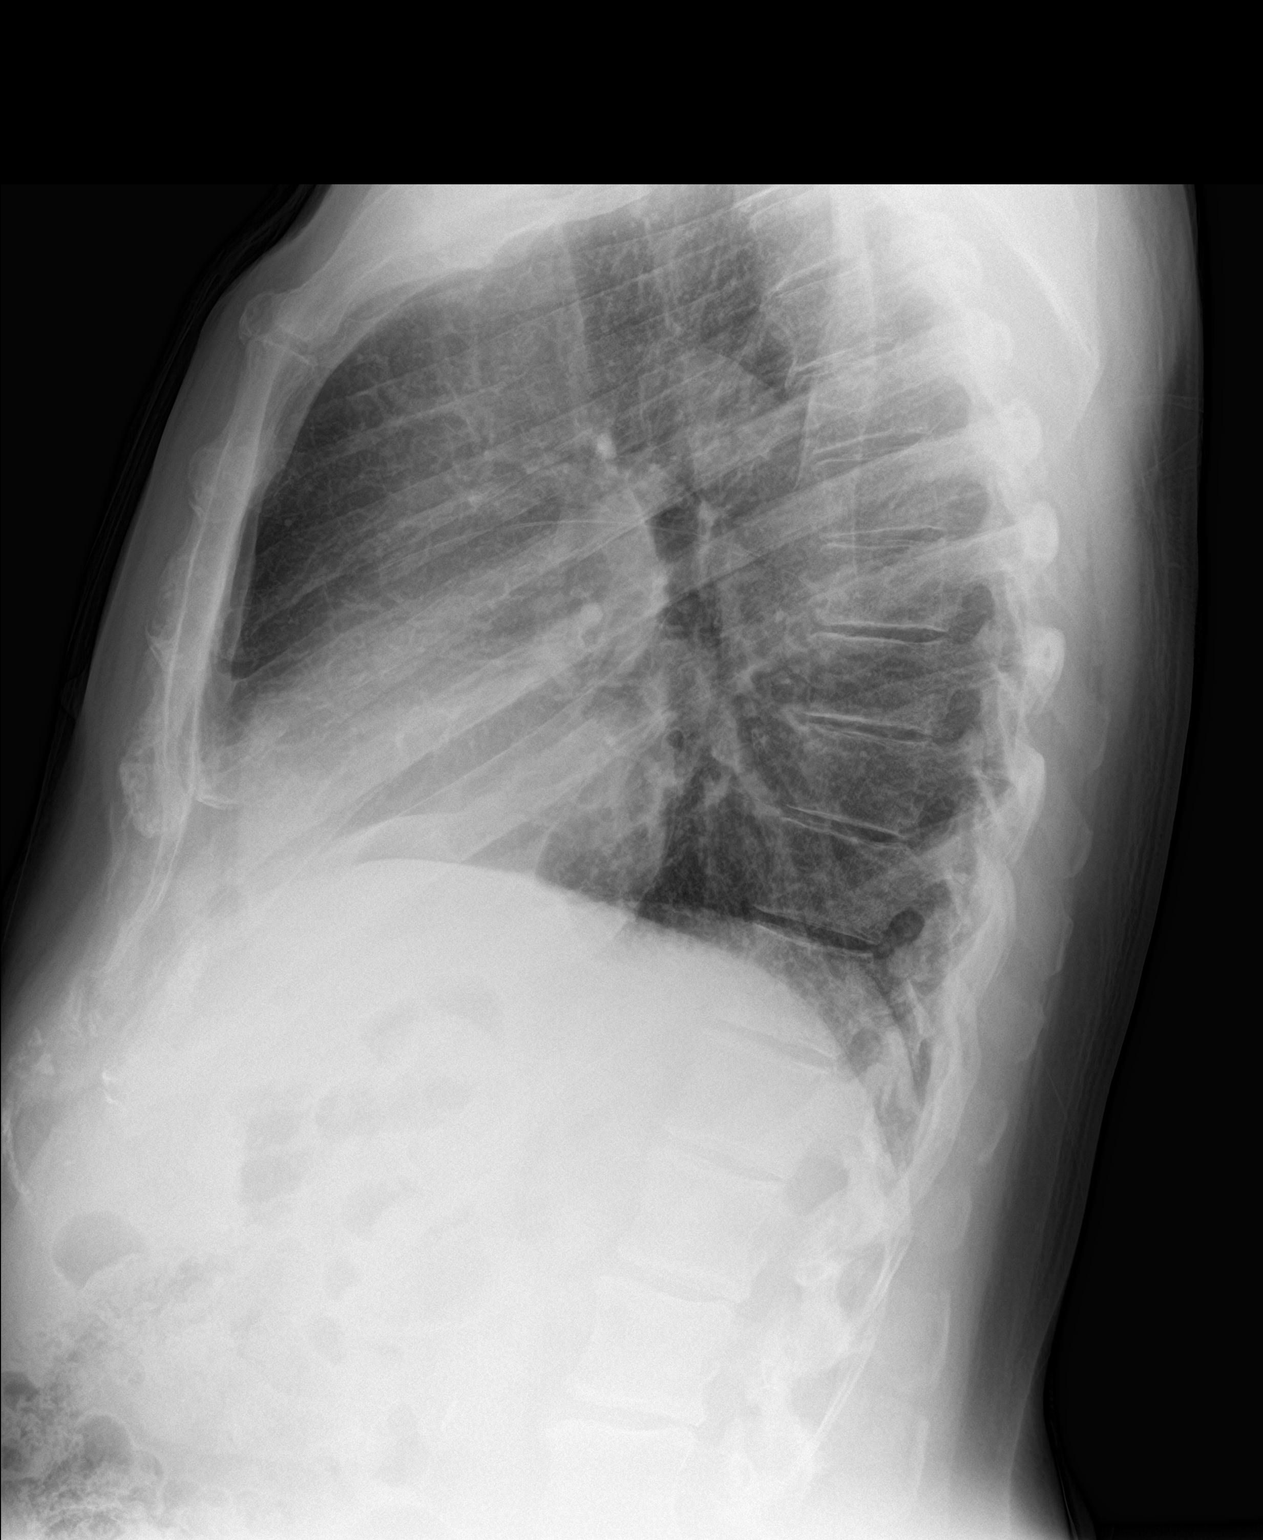

[2 of 2 positions shown; findings below may reference images not displayed]

FINDINGS: Heart size is at the upper limits of normal. Mediastinal shadows are
otherwise normal. There is mild patchy density and volume loss in
the lingula. The remainder the chest is clear. No effusions. No
acute bone finding.
IMPRESSION: Patchy density and volume loss in the lingula consistent with mild
pneumonia. No dense consolidation.

## 2017-03-05 ENCOUNTER — Other Ambulatory Visit: Payer: Self-pay | Admitting: *Deleted

## 2017-03-05 NOTE — Telephone Encounter (Signed)
Pharmacy called to request a refill on pantoprazole for the patient. This was last refilled by patients pcp but Dr Burt Knack has refilled it in the past. Okay to refill or defer back to PCP? Please advise. Thanks, MI

## 2017-03-13 DIAGNOSIS — L03032 Cellulitis of left toe: Secondary | ICD-10-CM | POA: Diagnosis not present

## 2017-03-13 DIAGNOSIS — M79675 Pain in left toe(s): Secondary | ICD-10-CM | POA: Diagnosis not present

## 2017-03-13 DIAGNOSIS — M2042 Other hammer toe(s) (acquired), left foot: Secondary | ICD-10-CM | POA: Diagnosis not present

## 2017-03-13 DIAGNOSIS — M79674 Pain in right toe(s): Secondary | ICD-10-CM | POA: Diagnosis not present

## 2017-03-13 NOTE — Progress Notes (Signed)
Cardiology Office Note:    Date:  03/14/2017   ID:  [name], DOB 05-31-1945, MRN [id number]  PCP:  [name], MD  Cardiologist:  Dr. [name]   Electrophysiologist: Dr. Allegra Lai    Referring MD: [name], MD   Chief Complaint  Patient presents with  . Congestive Heart Failure    follow up    History of Present Illness:    [name] is a 71 y.o. male with a hx of chronic systolic heart failure, dilated cardiomyopathy, coronary artery disease status post prior PCI to the RCA, left bundle branch block, hypertension, hyperlipidemia, sleep apnea.  He was seen in 7/18 with complaints of worsening shortness of breath.  Echo demonstrated worsening LVF with EF 30-35 and CPX demonstrated limitation from mild HF, obesity and restrictive lung physiology.  He was set up for R/L Cardiac Catheterization that demonstrated patent RCA stent, mild non-obs CAD elsewhere and well compensate filling pressures.    I saw him in follow-up 02/12/17 and placed him on Entresto.  Of note, he has been evaluated by Dr. Curt Bears and is pending implantation of CRT-D.  Mr. Proudfoot returns for follow-up.  He is here alone.  He went to [place] recently with his grandson.  He was able to walk throughout the city without much shortness of breath.  However, if he carries something heavy or goes up steps, he gets out of breath.  He denies chest discomfort, paroxysmal nocturnal dyspnea or syncope.  He does note some mild pedal edema.  This is fairly stable.  Prior CV studies:   The following studies were reviewed today:  R/L Cardiac Catheterization 02/15/17 LAD mid 30 LCx irregs RCA mid stent patent LVEDP 5, mean PCWP 6, mean PA 17  CPX 12/04/16 CPX testing reveals a low normal pVO2 in the setting of a mildly submaximal test. The patient's limitation seems multifactorial due to a combination of mild HF, obesity and restrictive lung physiology. There are also frequent PVCs. Would  recommend exercise training program and weight loss. Consider 48-hour Holter monitor to quantify PVC burden.  Echocardiogram 12/03/16 Mild conc LVH, EF 30-35, no RWMA, Gr 2 DD, trivial AI, mod to severe MR, mod LAE, normal RVSF, mild PI, PASP 36  Echo 03/08/15 Mild LVH, EF 35-40, Gr 1 DD, mild MR, mild LAE, normal RVSF, PASP 26  Cardiac MRI 08/07/11 1) Mild LVE with abnormal septal motion EF 48% 2) No infarct or scar on hyperenhancement images 3) Mild LAE 4) Normal right sided cardiac chambers  Past Medical History:  Diagnosis Date  . Allergic rhinitis   . Anginal pain ([place]) 05/26/14   chest pain after chasing dog  . CAD (coronary artery disease)    hx of stent- 2005 RCA  . CHF (congestive heart failure) ([place])   . Chronic back pain    intermittent  . Constipation   . Dizziness   . Dysrhythmia    right bundle branch block   . Esophageal stricture   . GERD (gastroesophageal reflux disease)   . Hemoptysis   . Hiatal hernia   . History of colonic polyps    hyperplastic  . HTN (hypertension)   . Hyperlipidemia   . Hypertrophy of prostate with urinary obstruction and other lower urinary tract symptoms (LUTS)   . OA (osteoarthritis)   . OSA (obstructive sleep apnea)    cpap- 10   . 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 Ardis Hughs 5/10  . Pneumonia   . Shortness of breath    with exertion  . Testicular hypofunction     Past Surgical History:  Procedure Laterality Date  . CARDIAC CATHETERIZATION     '05, last 2009, showing patent RCA stent  . COLONOSCOPY  12/2007   HYPERPLASTIC POLYP  . COLONOSCOPY WITH PROPOFOL N/A 06/02/2014   Procedure: COLONOSCOPY WITH PROPOFOL;  Surgeon: [name], MD;  Location: [facility];  Service: Endoscopy;  Laterality: N/A;  . CORONARY ANGIOPLASTY  08/2003  . ESOPHAGOGASTRODUODENOSCOPY (EGD) WITH PROPOFOL N/A 06/02/2014   Procedure:  ESOPHAGOGASTRODUODENOSCOPY (EGD) WITH PROPOFOL;  Surgeon: [name], MD;  Location: [facility];  Service: Endoscopy;  Laterality: N/A;  . hernia surgery x 3     Bilateral Inguinal, Umbicial  . IRRIGATION AND DEBRIDEMENT ABSCESS Left 08/14/2012   Procedure: IRRIGATION AND DEBRIDEMENT LEFT INGUINAL BOIL ;  Surgeon: Ailene Rud, MD;  Location: [facility];  Service: Urology;  Laterality: Left;  . LEFT HEART CATHETERIZATION WITH CORONARY ANGIOGRAM N/A 05/26/2014   Procedure: LEFT HEART CATHETERIZATION WITH CORONARY ANGIOGRAM;  Surgeon: Blane Ohara, MD;  Location: [facility] CATH LAB;  Service: Cardiovascular;  Laterality: N/A;  . LUMBAR LAMINECTOMY/DECOMPRESSION MICRODISCECTOMY Left 09/26/2014   Procedure: Lumbar Laminectomy for resection of synovial cyst  Lumbar five- sacral one left;  Surgeon: Kary Kos, MD;  Location: [facility] OR[facility];  Service: Neurosurgery;  Laterality: Left;  . Oral surg to removed growth from ?sinus  10/2010   ?Dermoid removed by DrRiggs  . RCA stenting     '05 RCA  . right toe surgery  Right    Cyst   . RIGHT/LEFT HEART CATH AND CORONARY ANGIOGRAPHY N/A 01/15/2017   Procedure: RIGHT/LEFT HEART CATH AND CORONARY ANGIOGRAPHY;  Surgeon: [name], MD;  Location: [facility] CV LAB;  Service: Cardiovascular;  Laterality: N/A;  . s/p right knee arthroscopy  2005  . TOTAL KNEE ARTHROPLASTY Right 03/12/2016   Procedure: RIGHT TOTAL KNEE ARTHROPLASTY;  Surgeon: Paralee Cancel, MD;  Location: [facility];  Service: Orthopedics;  Laterality: Right;  . UPPER GASTROINTESTINAL ENDOSCOPY      Current Medications: Current Meds  Medication Sig  . ANDROGEL PUMP 20.25 MG/ACT (1.62%) GEL Apply 2 Pump topically daily. On each shoulder  . aspirin EC 81 MG tablet Take 1 tablet (81 mg total) by mouth daily.  Marland Kitchen atorvastatin (LIPITOR) 20 MG tablet Take 1 tablet (20 mg total) by mouth daily at 6 PM.  . b complex vitamins tablet Take 1 tablet by mouth daily.   . calcium carbonate (OSCAL) 1500  (600 Ca) MG TABS tablet Take 600 mg by mouth every evening.  . carvedilol (COREG) 12.5 MG tablet Take 1 tablet (12.5 mg total) by mouth 2 (two) times daily with a meal.  . CINNAMON PO Take 1 tablet by mouth daily.  . Coenzyme Q10 (CO Q 10) 100 MG CAPS Take 1 capsule by mouth daily.   . diclofenac sodium (VOLTAREN) 1 % GEL APPLY 2 GRAMS TO AFFECTED AREA 2 TO 4 TIMES DAILY PRN  . folic acid (FOLVITE) 099 MCG tablet Take 400 mcg by mouth daily.   . Glucosamine HCl (GLUCOSAMINE PO) Take 1,500 mg by mouth every evening.  . hydroxypropyl methylcellulose / hypromellose (ISOPTO TEARS / GONIOVISC) 2.5 % ophthalmic solution Place 1 drop into both eyes 4 (four) times daily as needed for dry eyes.   Marland Kitchen MAGNESIUM PO Take 1 tablet by mouth daily as  needed (for cramping).   . Multiple Vitamin (MULTIVITAMIN) capsule Take 1 capsule by mouth daily.    . Multiple Vitamins-Minerals (OCUVITE PO) Take 1 tablet by mouth at bedtime.  . nabumetone (RELAFEN) 500 MG tablet Take 1 tablet (500 mg total) by mouth 2 (two) times daily.  . NON FORMULARY CPAP machine  . Omega-3 Fatty Acids (FISH OIL) 500 MG CAPS Take 500 mg by mouth every evening.  . pantoprazole (PROTONIX) 40 MG tablet TAKE 1 TABLET BY MOUTH EVERY DAY BEFORE BREAKFAST  . Probiotic Product (PROBIOTIC DAILY PO) Take 1 capsule by mouth daily with lunch.   . sacubitril-valsartan (ENTRESTO) 49-51 MG Take 1 tablet by mouth 2 (two) times daily.  . Saw Palmetto, Serenoa repens, (SAW PALMETTO PO) Take 1 capsule by mouth every evening.  Marland Kitchen spironolactone (ALDACTONE) 25 MG tablet Take 0.5 tablets (12.5 mg total) by mouth daily.  . tamsulosin (FLOMAX) 0.4 MG CAPS capsule Take 0.4 mg by mouth at bedtime.  . traMADol (ULTRAM) 50 MG tablet Take 50 mg by mouth every 6 (six) hours as needed (pain).  . TURMERIC PO Take 1 tablet by mouth daily with lunch.   . [DISCONTINUED] pantoprazole (PROTONIX) 40 MG tablet TAKE 1 TABLET BY MOUTH EVERY DAY BEFORE BREAKFAST     Allergies:    Sulfamethoxazole-trimethoprim and Sulfonamide derivatives   Social History  Substance Use Topics  . Smoking status: Former Smoker    Quit date: 05/20/1990  . Smokeless tobacco: Never Used     Comment: previous 30 pack year history  . Alcohol use Yes     Comment: rare     ROS:   Please see the history of present illness.    ROS All other systems reviewed and are negative.   EKGs/Labs/Other Test Reviewed:    EKG:  EKG is not ordered today.    Recent Labs: 10/23/2016: ALT 22; TSH 3.19 11/27/2016: Magnesium 2.1; NT-Pro BNP 276 01/15/2017: Hemoglobin 14.4; Platelets 224 02/26/2017: BUN 16; Creatinine, Ser 0.95; Potassium 4.4; Sodium 139   Recent Lipid Panel Lab Results  Component Value Date/Time   CHOL 138 11/27/2016 09:14 AM   TRIG 188 (H) 11/27/2016 09:14 AM   HDL 35 (L) 11/27/2016 09:14 AM   CHOLHDL 3.9 11/27/2016 09:14 AM   CHOLHDL 4 12/07/2015 07:42 AM   LDLCALC 65 11/27/2016 09:14 AM    Physical Exam:    VS:  BP (!) 118/50   Pulse 88   Ht 5\' 8"  (1.727 m)   Wt 186 lb 1.9 oz (84.4 kg)   SpO2 94%   BMI 28.30 kg/m     Wt Readings from Last 3 Encounters:  03/14/17 186 lb 1.9 oz (84.4 kg)  02/12/17 183 lb 8 oz (83.2 kg)  02/12/17 183 lb 12.8 oz (83.4 kg)     Physical Exam  Constitutional: He is oriented to person, place, and time. He appears well-developed and well-nourished. No distress.  HENT:  Head: Normocephalic and atraumatic.  Neck: No JVD present.  Cardiovascular: Normal rate and regular rhythm.   No murmur heard. Pulmonary/Chest: Effort normal. He has no rales.  Abdominal: Soft.  Musculoskeletal: He exhibits no edema.  Neurological: He is alert and oriented to person, place, and time.  Skin: Skin is warm and dry.    ASSESSMENT:    1. Chronic systolic heart failure ([place])   2. Coronary artery disease involving native coronary artery of native heart without angina pectoris   3. Essential hypertension    PLAN:  In order of problems listed  above:  1. Chronic systolic heart failure (HCC) New York Heart Association class II.  EF 30-35.  His volume is stable. He is on a good medical regimen with angiotensin receptor neprilysin inhibitor (Entresto), beta-blocker (Coreg) and Aldosterone antagonist (Spironolactone).  Continue current medical regimen.  He has a follow-up next week with Dr. Curt Bears for preprocedure visit prior to CRT-D implantation.  He did have questions today regarding the recent change from losartan to [facility].  We briefly discussed the PARADIGM-HF trial.  He also had questions regarding reasons for CRT-D implantation.  We discussed the rationale for this device.  2. Coronary artery disease involving native coronary artery of native heart without angina pectoris History of prior stent to the RCA.  Most recent cardiac catheterization demonstrates patent RCA stent.  He has not experienced angina.  Continue aspirin, statin, beta-blocker.  3. Essential hypertension The patient's blood pressure is controlled on his current regimen.  Continue current therapy.     Dispo:  Return in about 3 months (around 06/14/2017) for Routine Follow Up, w/ Dr. Burt Knack.   Medication Adjustments/Labs and Tests Ordered: Current medicines are reviewed at length with the patient today.  Concerns regarding medicines are outlined above.  Tests Ordered: No orders of the defined types were placed in this encounter.  Medication Changes: Meds ordered this encounter  Medications  . pantoprazole (PROTONIX) 40 MG tablet    Sig: TAKE 1 TABLET BY MOUTH EVERY DAY BEFORE BREAKFAST    Dispense:  90 tablet    Refill:  3    Signed, Richardson Dopp, PA-C  03/14/2017 8:51 AM    [facility], [place],   [address] Phone: [phone]; Fax: [phone]

## 2017-03-14 ENCOUNTER — Encounter: Payer: Self-pay | Admitting: Physician Assistant

## 2017-03-14 ENCOUNTER — Ambulatory Visit (INDEPENDENT_AMBULATORY_CARE_PROVIDER_SITE_OTHER): Payer: Medicare Other | Admitting: Physician Assistant

## 2017-03-14 ENCOUNTER — Other Ambulatory Visit: Payer: Medicare Other

## 2017-03-14 VITALS — BP 118/50 | HR 88 | Ht 68.0 in | Wt 186.1 lb

## 2017-03-14 DIAGNOSIS — I5022 Chronic systolic (congestive) heart failure: Secondary | ICD-10-CM

## 2017-03-14 DIAGNOSIS — I2510 Atherosclerotic heart disease of native coronary artery without angina pectoris: Secondary | ICD-10-CM

## 2017-03-14 DIAGNOSIS — I10 Essential (primary) hypertension: Secondary | ICD-10-CM

## 2017-03-14 MED ORDER — PANTOPRAZOLE SODIUM 40 MG PO TBEC: DELAYED_RELEASE_TABLET | ORAL | 3 refills | Status: DC

## 2017-03-14 NOTE — Patient Instructions (Addendum)
Medication Instructions:  Your physician recommends that you continue on your current medications as directed. Please refer to the Current Medication list given to you today.   Labwork: NONE ORDERED TODAY  Testing/Procedures: NONE ORDERED TODAY  Follow-Up: DR. Burt Knack 06/19/17 @ 8 AM...  Any Other Special Instructions Will Be Listed Below (If Applicable).     If you need a refill on your cardiac medications before your next appointment, please call your pharmacy.

## 2017-03-19 NOTE — Progress Notes (Signed)
Electrophysiology Office Note   Date:  03/20/2017   ID:  [name], DOB 12/25/1945, MRN [id number]  PCP:  [name], MD  Cardiologist:  Burt Knack Primary Electrophysiologist:  Amiree No [name], MD    Chief Complaint  Patient presents with  . Follow-up    Dilated cardiomyopathy/Chronic systolic HF/ H&P Pre labs     History of Present Illness: [name] is a 71 y.o. male who is being seen today for the evaluation of CHF at the request of [name], MD. Presenting today for electrophysiology evaluation. He has a history of chronic systolic heart failure, coronary artery disease status post stenting of the RCA, left bundle branch block. He also has sleep apnea and uses a CPAP at night.    Today, he denies symptoms of palpitations, chest pain, orthopnea, PND, lower extremity edema, claudication, dizziness, presyncope, syncope, bleeding, or neurologic sequela. The patient is tolerating medications without difficulties.  Main symptoms dyspnea on exertion. He says that he feels okay when he ambulates but when he has to exert himself or lift something, he gets quite short of breath.  He is planned for CRT-D implantation on 03/27/17.  Today, denies symptoms of palpitations, chest pain, orthopnea, PND, lower extremity edema, claudication, dizziness, presyncope, syncope, bleeding, or neurologic sequela. The patient is tolerating medications without difficulties.  He does not have any acute complaints.  He is continuing to feel well without much in the way of chest pain.  Does have chronic shortness of breath.  Past Medical History:  Diagnosis Date  . Allergic rhinitis   . Anginal pain ([place]) 05/26/14   chest pain after chasing dog  . CAD (coronary artery disease)    hx of stent- 2005 RCA  . CHF (congestive heart failure) ([place])   . Chronic back pain    intermittent  . Constipation   . Dizziness   . Dysrhythmia    right bundle branch block   . Esophageal stricture   .  GERD (gastroesophageal reflux disease)   . Hemoptysis   . Hiatal hernia   . History of colonic polyps    hyperplastic  . HTN (hypertension)   . Hyperlipidemia   . Hypertrophy of prostate with urinary obstruction and other lower urinary tract symptoms (LUTS)   . OA (osteoarthritis)   . OSA (obstructive sleep apnea)    cpap- 10   . 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 Pneumonia   . Shortness of breath    with exertion  . Testicular hypofunction    Past Surgical History:  Procedure Laterality Date  . CARDIAC CATHETERIZATION     '05, last 2009, showing patent RCA stent  . COLONOSCOPY  12/2007   HYPERPLASTIC POLYP  . COLONOSCOPY WITH PROPOFOL N/A 06/02/2014   Procedure: COLONOSCOPY WITH PROPOFOL;  Surgeon: [name], MD;  Location: [facility];  Service: Endoscopy;  Laterality: N/A;  . CORONARY ANGIOPLASTY  08/2003  . ESOPHAGOGASTRODUODENOSCOPY (EGD) WITH PROPOFOL N/A 06/02/2014   Procedure: ESOPHAGOGASTRODUODENOSCOPY (EGD) WITH PROPOFOL;  Surgeon: [name], MD;  Location: [facility];  Service: Endoscopy;  Laterality: N/A;  . hernia surgery x 3     Bilateral Inguinal, Umbicial  . IRRIGATION AND DEBRIDEMENT ABSCESS Left 08/14/2012   Procedure: IRRIGATION AND DEBRIDEMENT LEFT INGUINAL BOIL ;  Surgeon: Ailene Rud, MD;  Location: [facility];  Service: Urology;  Laterality: Left;  . LEFT HEART  CATHETERIZATION WITH CORONARY ANGIOGRAM N/A 05/26/2014   Procedure: LEFT HEART CATHETERIZATION WITH CORONARY ANGIOGRAM;  Surgeon: Blane Ohara, MD;  Location: [facility] CATH LAB;  Service: Cardiovascular;  Laterality: N/A;  . LUMBAR LAMINECTOMY/DECOMPRESSION MICRODISCECTOMY Left 09/26/2014   Procedure: Lumbar Laminectomy for resection of synovial cyst  Lumbar five- sacral one left;  Surgeon: Kary Kos, MD;  Location: [place] NEURO ORS;  Service: Neurosurgery;  Laterality: Left;  . Oral surg to removed growth  from ?sinus  10/2010   ?Dermoid removed by DrRiggs  . RCA stenting     '05 RCA  . right toe surgery  Right    Cyst   . RIGHT/LEFT HEART CATH AND CORONARY ANGIOGRAPHY N/A 01/15/2017   Procedure: RIGHT/LEFT HEART CATH AND CORONARY ANGIOGRAPHY;  Surgeon: [name], MD;  Location: [place] CV LAB;  Service: Cardiovascular;  Laterality: N/A;  . s/p right knee arthroscopy  2005  . TOTAL KNEE ARTHROPLASTY Right 03/12/2016   Procedure: RIGHT TOTAL KNEE ARTHROPLASTY;  Surgeon: Paralee Cancel, MD;  Location: [facility];  Service: Orthopedics;  Laterality: Right;  . UPPER GASTROINTESTINAL ENDOSCOPY       Current Outpatient Prescriptions  Medication Sig Dispense Refill  . ANDROGEL PUMP 20.25 MG/ACT (1.62%) GEL Apply 2 Pump topically daily. On each shoulder    . aspirin EC 81 MG tablet Take 1 tablet (81 mg total) by mouth daily.    Marland Kitchen atorvastatin (LIPITOR) 20 MG tablet Take 1 tablet (20 mg total) by mouth daily at 6 PM. 90 tablet 2  . b complex vitamins tablet Take 1 tablet by mouth daily.     . calcium carbonate (OSCAL) 1500 (600 Ca) MG TABS tablet Take 600 mg by mouth every evening.    . carvedilol (COREG) 12.5 MG tablet Take 1 tablet (12.5 mg total) by mouth 2 (two) times daily with a meal. 180 tablet 2  . CINNAMON PO Take 1 tablet by mouth daily.    . Coenzyme Q10 (CO Q 10) 100 MG CAPS Take 1 capsule by mouth daily.     . diclofenac sodium (VOLTAREN) 1 % GEL APPLY 2 GRAMS TO AFFECTED AREA 2 TO 4 TIMES DAILY PRN  3  . folic acid (FOLVITE) 742 MCG tablet Take 400 mcg by mouth daily.     . Glucosamine HCl (GLUCOSAMINE PO) Take 1,500 mg by mouth every evening.    . hydroxypropyl methylcellulose / hypromellose (ISOPTO TEARS / GONIOVISC) 2.5 % ophthalmic solution Place 1 drop into both eyes 4 (four) times daily as needed for dry eyes.     Marland Kitchen MAGNESIUM PO Take 1 tablet by mouth daily as needed (for cramping).     . Multiple Vitamin (MULTIVITAMIN) capsule Take 1 capsule by mouth daily.      . Multiple  Vitamins-Minerals (OCUVITE PO) Take 1 tablet by mouth at bedtime.    . nabumetone (RELAFEN) 500 MG tablet Take 1 tablet (500 mg total) by mouth 2 (two) times daily. 180 tablet 0  . NON FORMULARY CPAP machine    . Omega-3 Fatty Acids (FISH OIL) 500 MG CAPS Take 500 mg by mouth every evening.    . pantoprazole (PROTONIX) 40 MG tablet TAKE 1 TABLET BY MOUTH EVERY DAY BEFORE BREAKFAST 90 tablet 3  . Probiotic Product (PROBIOTIC DAILY PO) Take 1 capsule by mouth daily with lunch.     . sacubitril-valsartan (ENTRESTO) 49-51 MG Take 1 tablet by mouth 2 (two) times daily. 180 tablet 3  . Saw Palmetto, Serenoa repens, (SAW PALMETTO PO)  Take 1 capsule by mouth every evening.    Marland Kitchen spironolactone (ALDACTONE) 25 MG tablet Take 0.5 tablets (12.5 mg total) by mouth daily. 15 tablet 11  . tamsulosin (FLOMAX) 0.4 MG CAPS capsule Take 0.4 mg by mouth at bedtime.    . traMADol (ULTRAM) 50 MG tablet Take 50 mg by mouth every 6 (six) hours as needed (pain).    . TURMERIC PO Take 1 tablet by mouth daily with lunch.      No current facility-administered medications for this visit.     Allergies:   Sulfamethoxazole-trimethoprim and Sulfonamide derivatives   Social History:  The patient  reports that he quit smoking about 26 years ago. He has never used smokeless tobacco. He reports that he drinks alcohol. He reports that he does not use drugs.   Family History:  The patient's family history includes Coronary artery disease in his father; Diabetes in his father; Heart disease in his father and mother; Hypertension in his unknown relative; Stomach cancer in his paternal grandmother; Sudden death in his father.    ROS:  Please see the history of present illness.   Otherwise, review of systems is positive for none.   All other systems are reviewed and negative.   PHYSICAL EXAM: VS:  BP 110/72   Pulse 82   Ht 5\' 8"  (1.727 m)   Wt 185 lb (83.9 kg)   SpO2 96%   BMI 28.13 kg/m  , BMI Body mass index is 28.13  kg/m. GEN: Well nourished, well developed, in no acute distress  HEENT: normal  Neck: no JVD, carotid bruits, or masses Cardiac: RRR; no murmurs, rubs, or gallops,no edema  Respiratory:  clear to auscultation bilaterally, normal work of breathing GI: soft, nontender, nondistended, + BS MS: no deformity or atrophy  Skin: warm and dry Neuro:  Strength and sensation are intact Psych: euthymic mood, full affect  EKG:  EKG is not ordered today. Personal review of the ekg ordered 02/12/17 shows SR, 1 degree AV block, LBBB  Recent Labs: 10/23/2016: ALT 22; TSH 3.19 11/27/2016: Magnesium 2.1; NT-Pro BNP 276 01/15/2017: Hemoglobin 14.4; Platelets 224 02/26/2017: BUN 16; Creatinine, Ser 0.95; Potassium 4.4; Sodium 139    Lipid Panel     Component Value Date/Time   CHOL 138 11/27/2016 0914   TRIG 188 (H) 11/27/2016 0914   HDL 35 (L) 11/27/2016 0914   CHOLHDL 3.9 11/27/2016 0914   CHOLHDL 4 12/07/2015 0742   VLDL 30.6 12/07/2015 0742   LDLCALC 65 11/27/2016 0914     Wt Readings from Last 3 Encounters:  03/20/17 185 lb (83.9 kg)  03/14/17 186 lb 1.9 oz (84.4 kg)  02/12/17 183 lb 8 oz (83.2 kg)      Other studies Reviewed: Additional studies/ records that were reviewed today include: TTE 12/03/16  Review of the above records today demonstrates:  - Left ventricle: The cavity size was mildly dilated. There was   mild concentric hypertrophy. Systolic function was moderately to   severely reduced. The estimated ejection fraction was in the   range of 30% to 35%. Wall motion was normal; there were no   regional wall motion abnormalities. Features are consistent with   a pseudonormal left ventricular filling pattern, with concomitant   abnormal relaxation and increased filling pressure (grade 2   diastolic dysfunction). Doppler parameters are consistent with   elevated ventricular end-diastolic filling pressure. - Aortic valve: There was trivial regurgitation. - Mitral valve: There was  moderate to severe regurgitation. Valve  area by pressure half-time: 1.59 cm^2. - Left atrium: The atrium was moderately dilated. - Right ventricle: Systolic function was normal. - Pulmonic valve: There was mild regurgitation. - Pulmonary arteries: Systolic pressure was mildly increased. PA   peak pressure: 36 mm Hg (S). - Inferior vena cava: The vessel was normal in size. The   respirophasic diameter changes were in the normal range (= 50%),   consistent with normal central venous pressure.  LHC/RHC 01/05/17 1. Widely patent coronary arteries with continued patency of the stented segment in the right coronary artery 2. Mild nonobstructive coronary artery disease as detailed with mild stenosis of the proximal to mid LAD and otherwise minimal luminal irregularities 3. Well compensated right-sided cardiac hemodynamics     ASSESSMENT AND PLAN:  1.  Chronic systolic heart failure: NYHA class II-III symptoms.  He is on optimal medical therapy.  Plan for ICD implant on 03/27/17.  Risks and benefits discussed.  Risks include bleeding, infection, tamponade, pneumothorax.  He understands these risks and is agreed to the procedure.  2. Coronary artery disease, native vessel, without angina: Recent cath showed patent stents.  No changes at this time.  3. Hyperlipidemia: Continue statin  4. Hypertension: Well-controlled, continue losartan and carvedilol.    Current medicines are reviewed at length with the patient today.   The patient does not have concerns regarding his medicines.  The following changes were made today:  none  Labs/ tests ordered today include:  Orders Placed This Encounter  Procedures  . Basic Metabolic Panel (BMET)  . CBC w/Diff     Disposition:   FU with Preslea Rhodus 3 months  Signed, Corvin Sorbo [name], MD  03/20/2017 10:18 AM     [facility] [address] [place] [address] [phone] (office) [phone] (fax)

## 2017-03-20 ENCOUNTER — Encounter: Payer: Self-pay | Admitting: Cardiology

## 2017-03-20 ENCOUNTER — Ambulatory Visit (INDEPENDENT_AMBULATORY_CARE_PROVIDER_SITE_OTHER): Payer: Medicare Other | Admitting: Cardiology

## 2017-03-20 VITALS — BP 110/72 | HR 82 | Ht 68.0 in | Wt 185.0 lb

## 2017-03-20 DIAGNOSIS — I2510 Atherosclerotic heart disease of native coronary artery without angina pectoris: Secondary | ICD-10-CM

## 2017-03-20 DIAGNOSIS — I10 Essential (primary) hypertension: Secondary | ICD-10-CM

## 2017-03-20 DIAGNOSIS — Z01812 Encounter for preprocedural laboratory examination: Secondary | ICD-10-CM | POA: Diagnosis not present

## 2017-03-20 DIAGNOSIS — I5022 Chronic systolic (congestive) heart failure: Secondary | ICD-10-CM | POA: Diagnosis not present

## 2017-03-20 DIAGNOSIS — I420 Dilated cardiomyopathy: Secondary | ICD-10-CM

## 2017-03-20 LAB — BASIC METABOLIC PANEL
BUN/Creatinine Ratio: 18 (ref 10–24)
BUN: 17 mg/dL (ref 8–27)
CALCIUM: 9.9 mg/dL (ref 8.6–10.2)
CHLORIDE: 101 mmol/L (ref 96–106)
CO2: 23 mmol/L (ref 20–29)
Creatinine, Ser: 0.94 mg/dL (ref 0.76–1.27)
GFR calc Af Amer: 93 mL/min/{1.73_m2} (ref >59)
GFR calc non Af Amer: 80 mL/min/{1.73_m2} (ref >59)
GLUCOSE: 101 mg/dL — AB (ref 65–99)
POTASSIUM: 4.7 mmol/L (ref 3.5–5.2)
Sodium: 139 mmol/L (ref 134–144)

## 2017-03-20 LAB — CBC WITH DIFFERENTIAL/PLATELET
BASOS: 1 %
Basophils Absolute: 0 10*3/uL (ref 0.0–0.2)
EOS (ABSOLUTE): 0.1 10*3/uL (ref 0.0–0.4)
EOS: 3 %
HEMATOCRIT: 45.1 % (ref 37.5–51.0)
Hemoglobin: 15.5 g/dL (ref 13.0–17.7)
IMMATURE GRANULOCYTES: 0 %
Immature Grans (Abs): 0 10*3/uL (ref 0.0–0.1)
LYMPHS ABS: 1.8 10*3/uL (ref 0.7–3.1)
Lymphs: 32 %
MCH: 31.1 pg (ref 26.6–33.0)
MCHC: 34.4 g/dL (ref 31.5–35.7)
MCV: 90 fL (ref 79–97)
MONOS ABS: 0.5 10*3/uL (ref 0.1–0.9)
Monocytes: 9 %
NEUTROS ABS: 3.1 10*3/uL (ref 1.4–7.0)
NEUTROS PCT: 55 %
Platelets: 256 10*3/uL (ref 150–379)
RBC: 4.99 x10E6/uL (ref 4.14–5.80)
RDW: 13 % (ref 12.3–15.4)
WBC: 5.6 10*3/uL (ref 3.4–10.8)

## 2017-03-20 NOTE — Patient Instructions (Addendum)
Medication Instructions:  Your physician recommends that you continue on your current medications as directed. Please refer to the Current Medication list given to you today.  -- If you need a refill on your cardiac medications before your next appointment, please call your pharmacy. --  Labwork: Pre procedure labs today: BMET & CBC w/ diff  Testing/Procedures: None ordered  Follow-Up: Your physician recommends that you schedule a follow-up appointment in: 3 months, after your procedure on 03/27/17, with Dr. Curt Bears.  Thank you for choosing [facility]!!   Trinidad Curet, RN [phone]

## 2017-03-26 DIAGNOSIS — L03031 Cellulitis of right toe: Secondary | ICD-10-CM | POA: Diagnosis not present

## 2017-03-27 ENCOUNTER — Ambulatory Visit (HOSPITAL_COMMUNITY): Payer: Medicare Other

## 2017-03-27 ENCOUNTER — Ambulatory Visit (HOSPITAL_COMMUNITY)
Admission: RE | Admit: 2017-03-27 | Discharge: 2017-03-28 | Disposition: A | Discharge status: Home / Self Care | Payer: Medicare Other | Source: Ambulatory Visit | Attending: Cardiology | Admitting: Cardiology

## 2017-03-27 ENCOUNTER — Encounter (HOSPITAL_COMMUNITY): Payer: Self-pay | Admitting: Cardiology

## 2017-03-27 ENCOUNTER — Encounter (HOSPITAL_COMMUNITY)
Admission: RE | Disposition: A | Discharge status: Home / Self Care | Payer: Self-pay | Source: Ambulatory Visit | Attending: Cardiology

## 2017-03-27 DIAGNOSIS — Z006 Encounter for examination for normal comparison and control in clinical research program: Secondary | ICD-10-CM | POA: Insufficient documentation

## 2017-03-27 DIAGNOSIS — I2510 Atherosclerotic heart disease of native coronary artery without angina pectoris: Secondary | ICD-10-CM | POA: Insufficient documentation

## 2017-03-27 DIAGNOSIS — Z882 Allergy status to sulfonamides status: Secondary | ICD-10-CM | POA: Diagnosis not present

## 2017-03-27 DIAGNOSIS — Z95818 Presence of other cardiac implants and grafts: Secondary | ICD-10-CM

## 2017-03-27 DIAGNOSIS — I110 Hypertensive heart disease with heart failure: Secondary | ICD-10-CM | POA: Insufficient documentation

## 2017-03-27 DIAGNOSIS — R0600 Dyspnea, unspecified: Secondary | ICD-10-CM

## 2017-03-27 DIAGNOSIS — I5022 Chronic systolic (congestive) heart failure: Secondary | ICD-10-CM | POA: Diagnosis not present

## 2017-03-27 DIAGNOSIS — I420 Dilated cardiomyopathy: Secondary | ICD-10-CM | POA: Diagnosis not present

## 2017-03-27 DIAGNOSIS — J939 Pneumothorax, unspecified: Secondary | ICD-10-CM | POA: Diagnosis not present

## 2017-03-27 DIAGNOSIS — I447 Left bundle-branch block, unspecified: Secondary | ICD-10-CM | POA: Insufficient documentation

## 2017-03-27 DIAGNOSIS — Z95810 Presence of automatic (implantable) cardiac defibrillator: Secondary | ICD-10-CM

## 2017-03-27 DIAGNOSIS — I509 Heart failure, unspecified: Secondary | ICD-10-CM

## 2017-03-27 DIAGNOSIS — I428 Other cardiomyopathies: Secondary | ICD-10-CM

## 2017-03-27 HISTORY — PX: BIV ICD INSERTION CRT-D: EP1195

## 2017-03-27 LAB — SURGICAL PCR SCREEN
MRSA, PCR: NEGATIVE
Staphylococcus aureus: NEGATIVE

## 2017-03-27 SURGERY — BIV ICD INSERTION CRT-D

## 2017-03-27 MED ORDER — ONDANSETRON HCL 4 MG/2ML IJ SOLN: 4.0000 mg | Freq: Four times a day (QID) | INTRAMUSCULAR | Status: DC | PRN

## 2017-03-27 MED ORDER — CALCIUM CARBONATE 1250 (500 CA) MG PO TABS
1250.0000 mg | ORAL_TABLET | Freq: Every evening | ORAL | Status: DC
  Filled 2017-03-27: qty 1

## 2017-03-27 MED ORDER — MIDAZOLAM HCL 5 MG/5ML IJ SOLN
INTRAMUSCULAR | Status: DC | PRN
Start: 2017-03-27 — End: 2017-03-27
  Administered 2017-03-27 (×2): 1 mg via INTRAVENOUS
  Administered 2017-03-27: 2 mg via INTRAVENOUS
  Administered 2017-03-27: 1 mg via INTRAVENOUS

## 2017-03-27 MED ORDER — CEFAZOLIN SODIUM-DEXTROSE 2-4 GM/100ML-% IV SOLN
INTRAVENOUS | Status: AC
  Filled 2017-03-27: qty 100

## 2017-03-27 MED ORDER — TRAMADOL HCL 50 MG PO TABS
50.0000 mg | ORAL_TABLET | Freq: Every day | ORAL | Status: DC
  Administered 2017-03-27: 50 mg via ORAL
  Filled 2017-03-27: qty 1

## 2017-03-27 MED ORDER — CALCIUM CARBONATE 1500 (600 CA) MG PO TABS
600.0000 mg | ORAL_TABLET | Freq: Every evening | ORAL | Status: DC
  Filled 2017-03-27: qty 1

## 2017-03-27 MED ORDER — B COMPLEX PO TABS: 1.0000 | ORAL_TABLET | Freq: Every day | ORAL | Status: DC

## 2017-03-27 MED ORDER — SODIUM CHLORIDE 0.9 % IR SOLN
80.0000 mg | Status: AC
  Administered 2017-03-27: 80 mg
  Filled 2017-03-27: qty 2

## 2017-03-27 MED ORDER — MUPIROCIN 2 % EX OINT
TOPICAL_OINTMENT | CUTANEOUS | Status: AC
  Administered 2017-03-27: 1 via TOPICAL
  Filled 2017-03-27: qty 22

## 2017-03-27 MED ORDER — MULTIVITAMINS PO CAPS: 1.0000 | ORAL_CAPSULE | Freq: Every day | ORAL | Status: DC

## 2017-03-27 MED ORDER — SACUBITRIL-VALSARTAN 49-51 MG PO TABS
1.0000 | ORAL_TABLET | Freq: Two times a day (BID) | ORAL | Status: DC
  Administered 2017-03-27: 1 via ORAL
  Filled 2017-03-27 (×2): qty 1

## 2017-03-27 MED ORDER — SACUBITRIL-VALSARTAN 49-51 MG PO TABS
1.0000 | ORAL_TABLET | Freq: Two times a day (BID) | ORAL | Status: DC
  Filled 2017-03-27: qty 1

## 2017-03-27 MED ORDER — FENTANYL CITRATE (PF) 100 MCG/2ML IJ SOLN
INTRAMUSCULAR | Status: DC | PRN
  Administered 2017-03-27 (×4): 25 ug via INTRAVENOUS

## 2017-03-27 MED ORDER — CEFAZOLIN SODIUM-DEXTROSE 1-4 GM/50ML-% IV SOLN
1.0000 g | Freq: Four times a day (QID) | INTRAVENOUS | Status: AC
  Administered 2017-03-27 – 2017-03-28 (×3): 1 g via INTRAVENOUS
  Filled 2017-03-27 (×3): qty 50

## 2017-03-27 MED ORDER — IOPAMIDOL (ISOVUE-370) INJECTION 76%
INTRAVENOUS | Status: AC
  Filled 2017-03-27: qty 50

## 2017-03-27 MED ORDER — FOLIC ACID 0.5 MG HALF TAB
500.0000 ug | ORAL_TABLET | Freq: Every day | ORAL | Status: DC
  Administered 2017-03-27 – 2017-03-28 (×2): 0.5 mg via ORAL
  Filled 2017-03-27 (×2): qty 1

## 2017-03-27 MED ORDER — NITROGLYCERIN 0.4 MG SL SUBL
0.4000 mg | SUBLINGUAL_TABLET | SUBLINGUAL | Status: DC | PRN
  Administered 2017-03-27 (×3): 0.4 mg via SUBLINGUAL
  Filled 2017-03-27: qty 1

## 2017-03-27 MED ORDER — HEPARIN (PORCINE) IN NACL 2-0.9 UNIT/ML-% IJ SOLN
INTRAMUSCULAR | Status: AC | PRN
  Administered 2017-03-27: 500 mL

## 2017-03-27 MED ORDER — ASPIRIN EC 81 MG PO TBEC
81.0000 mg | DELAYED_RELEASE_TABLET | Freq: Every day | ORAL | Status: DC
  Administered 2017-03-27 – 2017-03-28 (×2): 81 mg via ORAL
  Filled 2017-03-27 (×2): qty 1

## 2017-03-27 MED ORDER — SODIUM CHLORIDE 0.9 % IV SOLN
INTRAVENOUS | Status: DC
  Administered 2017-03-27: 07:00:00 via INTRAVENOUS

## 2017-03-27 MED ORDER — ATORVASTATIN CALCIUM 20 MG PO TABS
20.0000 mg | ORAL_TABLET | Freq: Every day | ORAL | Status: DC
  Administered 2017-03-27: 20 mg via ORAL
  Filled 2017-03-27: qty 1

## 2017-03-27 MED ORDER — ACETAMINOPHEN 325 MG PO TABS
325.0000 mg | ORAL_TABLET | ORAL | Status: DC | PRN
  Administered 2017-03-27: 650 mg via ORAL
  Filled 2017-03-27: qty 2

## 2017-03-27 MED ORDER — FENTANYL CITRATE (PF) 100 MCG/2ML IJ SOLN
INTRAMUSCULAR | Status: AC
  Filled 2017-03-27: qty 2

## 2017-03-27 MED ORDER — CALCIUM CARBONATE 1250 (500 CA) MG PO TABS
500.0000 mg | ORAL_TABLET | Freq: Every evening | ORAL | Status: DC
  Filled 2017-03-27 (×2): qty 1

## 2017-03-27 MED ORDER — GENTAMICIN SULFATE 40 MG/ML IJ SOLN
INTRAMUSCULAR | Status: AC
  Filled 2017-03-27: qty 2

## 2017-03-27 MED ORDER — NABUMETONE 500 MG PO TABS
500.0000 mg | ORAL_TABLET | Freq: Two times a day (BID) | ORAL | Status: DC
  Administered 2017-03-27 – 2017-03-28 (×2): 500 mg via ORAL
  Filled 2017-03-27 (×3): qty 1

## 2017-03-27 MED ORDER — SODIUM CHLORIDE 0.9 % IR SOLN
Status: DC | PRN
  Administered 2017-03-27: 10:00:00

## 2017-03-27 MED ORDER — PANTOPRAZOLE SODIUM 40 MG PO TBEC
40.0000 mg | DELAYED_RELEASE_TABLET | Freq: Every day | ORAL | Status: DC
  Administered 2017-03-28: 40 mg via ORAL
  Filled 2017-03-27 (×2): qty 1

## 2017-03-27 MED ORDER — BUPIVACAINE HCL (PF) 0.25 % IJ SOLN
INTRAMUSCULAR | Status: AC
  Filled 2017-03-27: qty 60

## 2017-03-27 MED ORDER — B COMPLEX-C PO TABS
1.0000 | ORAL_TABLET | Freq: Every day | ORAL | Status: DC
  Administered 2017-03-27 – 2017-03-28 (×2): 1 via ORAL
  Filled 2017-03-27 (×2): qty 1

## 2017-03-27 MED ORDER — TAMSULOSIN HCL 0.4 MG PO CAPS
0.4000 mg | ORAL_CAPSULE | Freq: Every day | ORAL | Status: DC
  Administered 2017-03-27: 0.4 mg via ORAL
  Filled 2017-03-27: qty 1

## 2017-03-27 MED ORDER — CEFAZOLIN SODIUM-DEXTROSE 2-4 GM/100ML-% IV SOLN
2.0000 g | INTRAVENOUS | Status: AC
  Administered 2017-03-27: 2 g via INTRAVENOUS
  Filled 2017-03-27: qty 100

## 2017-03-27 MED ORDER — TESTOSTERONE 50 MG/5GM (1%) TD GEL: 5.0000 g | Freq: Every day | TRANSDERMAL | Status: DC

## 2017-03-27 MED ORDER — SODIUM CHLORIDE 0.9 % IR SOLN
Status: AC
  Filled 2017-03-27: qty 2

## 2017-03-27 MED ORDER — CARVEDILOL 12.5 MG PO TABS
12.5000 mg | ORAL_TABLET | Freq: Two times a day (BID) | ORAL | Status: DC
  Administered 2017-03-27: 12.5 mg via ORAL
  Filled 2017-03-27 (×2): qty 1

## 2017-03-27 MED ORDER — OXYCODONE-ACETAMINOPHEN 5-325 MG PO TABS
1.0000 | ORAL_TABLET | Freq: Four times a day (QID) | ORAL | Status: DC | PRN
  Administered 2017-03-27 – 2017-03-28 (×3): 1 via ORAL
  Filled 2017-03-27 (×3): qty 1

## 2017-03-27 MED ORDER — HEPARIN (PORCINE) IN NACL 2-0.9 UNIT/ML-% IJ SOLN
INTRAMUSCULAR | Status: AC
  Filled 2017-03-27: qty 500

## 2017-03-27 MED ORDER — BUPIVACAINE HCL (PF) 0.25 % IJ SOLN
INTRAMUSCULAR | Status: DC | PRN
  Administered 2017-03-27: 60 mL

## 2017-03-27 MED ORDER — FISH OIL 500 MG PO CAPS: 500.0000 mg | ORAL_CAPSULE | Freq: Every day | ORAL | Status: DC

## 2017-03-27 MED ORDER — MIDAZOLAM HCL 5 MG/5ML IJ SOLN
INTRAMUSCULAR | Status: AC
  Filled 2017-03-27: qty 5

## 2017-03-27 MED ORDER — PROSIGHT PO TABS
1.0000 | ORAL_TABLET | Freq: Every day | ORAL | Status: DC
  Administered 2017-03-27 – 2017-03-28 (×2): 1 via ORAL
  Filled 2017-03-27 (×2): qty 1

## 2017-03-27 MED ORDER — NABUMETONE 500 MG PO TABS
500.0000 mg | ORAL_TABLET | Freq: Two times a day (BID) | ORAL | Status: DC
  Filled 2017-03-27: qty 1

## 2017-03-27 MED ORDER — IOPAMIDOL (ISOVUE-370) INJECTION 76%
INTRAVENOUS | Status: DC | PRN
  Administered 2017-03-27: 25 mL via INTRAVENOUS

## 2017-03-27 MED ORDER — SPIRONOLACTONE 25 MG PO TABS
12.5000 mg | ORAL_TABLET | Freq: Every day | ORAL | Status: DC
  Administered 2017-03-28: 12.5 mg via ORAL
  Filled 2017-03-27 (×2): qty 1

## 2017-03-27 MED ORDER — CO Q 10 100 MG PO CAPS: 1.0000 | ORAL_CAPSULE | Freq: Every day | ORAL | Status: DC

## 2017-03-27 MED ORDER — MUPIROCIN 2 % EX OINT
1.0000 "application " | TOPICAL_OINTMENT | Freq: Once | CUTANEOUS | Status: AC
  Administered 2017-03-27: 1 via TOPICAL
  Filled 2017-03-27: qty 22

## 2017-03-27 MED ORDER — HYPROMELLOSE (GONIOSCOPIC) 2.5 % OP SOLN
1.0000 [drp] | Freq: Four times a day (QID) | OPHTHALMIC | Status: DC | PRN
Start: 2017-03-27 — End: 2017-03-27

## 2017-03-27 SURGICAL SUPPLY — 17 items
BALLN ATTAIN 80 (BALLOONS) ×2
BALLOON ATTAIN 80 (BALLOONS) IMPLANT
CABLE SURGICAL S-101-97-12 (CABLE) ×1 IMPLANT
CATH ATTAIN COM SURV 6250V-MB2 (CATHETERS) ×1 IMPLANT
CATH HEX JOSEPH 2-5-2 65CM 6F (CATHETERS) ×1 IMPLANT
ICD CLARIA MRI DTMA1QQ (ICD Generator) ×1 IMPLANT
KIT MICROINTRODUCER STIFF 5F (SHEATH) ×1 IMPLANT
LEAD ATTAIN PERFOMA 4298-88CM (Lead) ×1 IMPLANT
LEAD CAPSURE NOVUS 5076-52CM (Lead) ×1 IMPLANT
LEAD SPRINT QUAT SEC 6935M-62 (Lead) ×1 IMPLANT
PAD DEFIB LIFELINK (PAD) ×1 IMPLANT
SHEATH CLASSIC 7F (SHEATH) ×1 IMPLANT
SHEATH CLASSIC 9.5F (SHEATH) ×1 IMPLANT
SHEATH CLASSIC 9F (SHEATH) ×1 IMPLANT
TRAY PACEMAKER INSERTION (PACKS) ×1 IMPLANT
WIRE ACUITY WHISPER EDS 4648 (WIRE) ×1 IMPLANT
WIRE MAILMAN 182CM (WIRE) ×1 IMPLANT

## 2017-03-27 NOTE — Discharge Summary (Signed)
ELECTROPHYSIOLOGY PROCEDURE DISCHARGE SUMMARY    Patient ID: [name],  MRN: [id number], DOB/AGE: [birth year] 71 y.o.  Admit date: 03/27/2017 Discharge date: 03/28/17  Primary Care Physician: [name], MD  Primary Cardiologist: Dr. Burt Knack Electrophysiologist: Dr. Curt Bears  Primary Discharge Diagnosis:  1. DCM 2. LBBB  Secondary Discharge Diagnosis:  1. CAD 2. Chronic CHF (systolic) 3. HTN  Allergies  Allergen Reactions  . Sulfamethoxazole-Trimethoprim Rash       . Sulfonamide Derivatives Rash          Procedures This Admission:  1.  Implantation of a MDT CRT-D on 03/27/17 by Dr Curt Bears.  The patient received Medtronic model [id number] (serial # [id number] ) right atrial lead and a Medtronic model [id number] (serial number [id number]) right ventricular defibrillator lead, Medtronic model 4598 - 88 (serial number [id number] ) lead (LV), Medtronic model Claria MRI Quad CRT0D SureScan (serial Number [id number] ) device DFT's were deferred at time of implant.   There were no immediate post procedure complications. 2.  CXR on 03/28/17 demonstrated no pneumothorax status post device implantation.   Brief HPI: SEANPAUL Green is a 71 y.o. male was referred to electrophysiology in the outpatient setting for consideration of CRT-ICD implantation.  Past medical history includes above.  The patient has persistent LV dysfunction despite guideline directed therapy.  Risks, benefits, and alternatives to ICD implantation were reviewed with the patient who wished to proceed.   Hospital Course:  The patient was admitted and underwent implantation of a CRT-D with details as outlined above. He was monitored on telemetry overnight which demonstrated SR/VP, episode of frequent PVCs, briefly bigemenal.  The patient had an episode CP, resolved with s/l NTG and this AM improved, Trop was checked <0.03, no ischemic EKG changes, and given not ongoing, no further w/u felt necessary by Dr. Curt Bears.  The patient was ambulating this Am without difficulty.  Left chest was without hematoma or ecchymosis.  The device was interrogated and found to be functioning normally.  CXR was obtained and demonstrated no pneumothorax status post device implantation.  Wound care, arm mobility, and restrictions were reviewed with the patient.  The patient was examined by Dr. Curt Bears and considered stable for discharge to home.   The patient's discharge medications include an ARB (Entresto) and beta blocker (carvedilol).   Physical Exam: Vitals:   03/27/17 2127 03/27/17 2150 03/28/17 0406 03/28/17 0846  BP: 135/73 119/79 (!) 113/56 102/79  Pulse: 80 87 76 98  Resp:   18 16  Temp:   98.2 F (36.8 C) 97.8 F (36.6 C)  TempSrc:   Oral Oral  SpO2: 94% 94% 93% 96%  Weight:      Height:        GEN- The patient is well appearing, alert and oriented x 3 today.   HEENT: normocephalic, atraumatic; sclera clear, conjunctiva pink; hearing intact; oropharynx clear Lungs- CTA b/l, normal work of breathing.  No wheezes, rales, rhonchi Heart- RRR, no murmurs, rubs or gallops, PMI not laterally displaced GI- soft, non-tender, non-distended, Extremities- no clubbing, cyanosis, or edema MS- no significant deformity or atrophy Skin- warm and dry, no rash or lesion, left chest without hematoma/ecchymosis Psych- euthymic mood, full affect Neuro- no gross defecits  Labs:   Lab Results  Component Value Date   WBC 5.6 03/20/2017   HGB 15.5 03/20/2017   HCT 45.1 03/20/2017   MCV 90 03/20/2017   PLT 256 03/20/2017   No results for input(s): NA, K, CL,  CO2, BUN, CREATININE, CALCIUM, PROT, BILITOT, ALKPHOS, ALT, AST, GLUCOSE in the last 168 hours.  Invalid input(s): LABALBU  Discharge Medications:  Allergies as of 03/28/2017      Reactions   Sulfamethoxazole-trimethoprim Rash       Sulfonamide Derivatives Rash          Medication List    TAKE these medications   ANDROGEL PUMP 20.25 MG/ACT (1.62%)  Gel Generic drug:  Testosterone Apply 2 Pump topically daily. On each shoulder   aspirin EC 81 MG tablet Take 1 tablet (81 mg total) by mouth daily.   atorvastatin 20 MG tablet Commonly known as:  LIPITOR Take 1 tablet (20 mg total) by mouth daily at 6 PM.   b complex vitamins tablet Take 1 tablet by mouth daily with lunch.   calcium carbonate 1500 (600 Ca) MG Tabs tablet Commonly known as:  OSCAL Take 600 mg by mouth every evening.   carvedilol 12.5 MG tablet Commonly known as:  COREG Take 1 tablet (12.5 mg total) by mouth 2 (two) times daily with a meal.   CINNAMON PO Take 1 tablet by mouth daily with lunch.   Co Q 10 100 MG Caps Take 1 capsule by mouth daily with lunch.   diclofenac sodium 1 % Gel Commonly known as:  VOLTAREN APPLY 2 GRAMS TO AFFECTED AREA 2 TO 4 TIMES DAILY AS NEEDED FOR PAIN.   Fish Oil 500 MG Caps Take 500 mg by mouth at bedtime.   folic acid 710 MCG tablet Commonly known as:  FOLVITE Take 400 mcg by mouth daily.   GLUCOSAMINE PO Take 1,500 mg by mouth every evening.   hydroxypropyl methylcellulose / hypromellose 2.5 % ophthalmic solution Commonly known as:  ISOPTO TEARS / GONIOVISC Place 1 drop into both eyes 4 (four) times daily as needed for dry eyes.   MAGNESIUM PO Take 1 tablet by mouth daily as needed (for cramping).   multivitamin capsule Take 1 capsule by mouth daily.   nabumetone 500 MG tablet Commonly known as:  RELAFEN Take 1 tablet (500 mg total) by mouth 2 (two) times daily.   NON FORMULARY CPAP machine with sleep.   OCUVITE PO Take 1 tablet by mouth at bedtime.   pantoprazole 40 MG tablet Commonly known as:  PROTONIX TAKE 1 TABLET BY MOUTH EVERY DAY BEFORE BREAKFAST   PROBIOTIC DAILY PO Take 1 capsule by mouth daily with lunch.   sacubitril-valsartan 49-51 MG Commonly known as:  ENTRESTO Take 1 tablet by mouth 2 (two) times daily.   SAW PALMETTO PO Take 1 capsule by mouth every evening.   spironolactone  25 MG tablet Commonly known as:  ALDACTONE Take 0.5 tablets (12.5 mg total) by mouth daily.   tamsulosin 0.4 MG Caps capsule Commonly known as:  FLOMAX Take 0.4 mg by mouth at bedtime.   traMADol 50 MG tablet Commonly known as:  ULTRAM Take 50 mg by mouth daily with lunch.   TURMERIC PO Take 1 tablet by mouth daily with lunch.       Disposition:  Home  Discharge Instructions    Diet - low sodium heart healthy   Complete by:  As directed    Increase activity slowly   Complete by:  As directed      Follow-up Information    [facility] Follow up on 04/09/2017.   Specialty:  Cardiology Why:  11:00AM, wound check visit Contact information: [address] [place]  [phone]       [name], MD Follow up on 06/19/2017.   Specialty:  Cardiology Why:  8:00AM Contact information: [address] [place] [address] [phone]        Constance Haw, MD Follow up on 07/09/2017.   Specialty:  Cardiology Why:  8:30AM Contact information: [place] [address] [phone]           Duration of Discharge Encounter: Greater than 30 minutes including physician time.  Signed, Tommye Standard, PA-C 03/28/2017 11:12 AM     I have seen and examined this patient with Tommye Standard.  Agree with above, note added to reflect my findings.  On exam, RRR, no murmurs, lungs clear. Had CRT D implanted. CXR and interrogation without issues. Had chest pain overnight with negative troponin. Possibly micro perf. Pain improving. Plan for discharge today with follow up in clinic.    Krystalynn Ridgeway M. Dainel Arcidiacono MD 03/28/2017 11:46 AM

## 2017-03-27 NOTE — H&P (Signed)
[name] is a 71 y.o. male with a history of dilated cardiomyopathy. He has an EF of 30-35%, LBBB and class II symptoms. He presents for CRT-D implant. On exam, RRR, no murmurs, lungs clear. Risks and benefits discussed. Risks include but not limited to bleeding, infection, tamponade, pneumothorax. He understands the risks and has agreed to the procedure.  Will Curt Bears, MD 03/27/2017 7:21 AM  ICD Criteria  Current LVEF:30-35%. Within 12 months prior to implant: Yes   Heart failure history: Yes, Class II  Cardiomyopathy history: Yes, Non-Ischemic Cardiomyopathy.  Atrial Fibrillation/Atrial Flutter: No.  Ventricular tachycardia history: No.  Cardiac arrest history: No.  History of syndromes with risk of sudden death: No.  Previous ICD: No.  Current ICD indication: Primary  PPM indication: No.   Class I or II Bradycardia indication present: No  Beta Blocker therapy for 3 or more months: Yes, prescribed.   Ace Inhibitor/ARB therapy for 3 or more months: Yes, prescribed.

## 2017-03-27 NOTE — Discharge Instructions (Signed)
° ° °  Supplemental Discharge Instructions for  Pacemaker/Defibrillator Patients  Activity No heavy lifting or vigorous activity with your left/right arm for 6 to 8 weeks.  Do not raise your left/right arm above your head for one week.  Gradually raise your affected arm as drawn below.             03/31/17                    04/01/17                  04/02/17                 04/03/17 __  NO DRIVING for  1 week   ; you may begin driving on   24/23/53  .  WOUND CARE - Keep the wound area clean and dry.  Do not get this area wet, no showers until cleared to at your wound check visit . - The tape/steri-strips on your wound will fall off; do not pull them off.  No bandage is needed on the site.  DO  NOT apply any creams, oils, or ointments to the wound area. - If you notice any drainage or discharge from the wound, any swelling or bruising at the site, or you develop a fever > 101? F after you are discharged home, call the office at once.  Special Instructions - You are still able to use cellular telephones; use the ear opposite the side where you have your pacemaker/defibrillator.  Avoid carrying your cellular phone near your device. - When traveling through airports, show security personnel your identification card to avoid being screened in the metal detectors.  Ask the security personnel to use the hand wand. - Avoid arc welding equipment, MRI testing (magnetic resonance imaging), TENS units (transcutaneous nerve stimulators).  Call the office for questions about other devices. - Avoid electrical appliances that are in poor condition or are not properly grounded. - Microwave ovens are safe to be near or to operate.  Additional information for defibrillator patients should your device go off: - If your device goes off ONCE and you feel fine afterward, notify the device clinic nurses. - If your device goes off ONCE and you do not feel well afterward, call 911. - If your device goes off TWICE,  call 911. - If your device goes off THREE times in one day, call 911.  DO NOT DRIVE YOURSELF OR A FAMILY MEMBER WITH A DEFIBRILLATOR TO THE HOSPITAL--CALL 911.

## 2017-03-27 NOTE — Progress Notes (Addendum)
Called by floor nurse that the patient c/o dyspnea and pleurtic chest pain. Had AICD placement today. Only pain medication is tylenol. Will write for percocet. Check STAT CXR to r/o PTX. Normal cardiac cath with minimal disease and patent stent in 12/2016. EKG shows a paced rhythm.  Pixie Casino, MD, [place] Director of the [facility] Attending Cardiologist  Direct Dial: [phone]  Fax: [phone]  Website:  [email or web address]

## 2017-03-27 NOTE — Plan of Care (Signed)
  Education: Knowledge of cardiac device and self-care will improve 03/27/2017 2022 - Progressing by Merla Riches, RN   Cardiac: Ability to achieve and maintain adequate cardiopulmonary perfusion will improve 03/27/2017 2022 - Progressing by Merla Riches, RN   Clinical Measurements: Ability to maintain clinical measurements within normal limits will improve 03/27/2017 2022 - Progressing by Merla Riches, RN   Clinical Measurements: Will remain free from infection 03/27/2017 2022 - Progressing by Merla Riches, RN   Clinical Measurements: Diagnostic test results will improve 03/27/2017 2022 - Progressing by Merla Riches, RN   Activity: Risk for activity intolerance will decrease 03/27/2017 2022 - Progressing by Merla Riches, RN   Pain Managment: General experience of comfort will improve 03/27/2017 2022 - Progressing by Merla Riches, RN   Completed/Met Education: Knowledge of General Education information will improve 03/27/2017 2022 - Completed/Met by Merla Riches, RN Clinical Measurements: Respiratory complications will improve 03/27/2017 2022 - Completed/Met by Merla Riches, RN Nutrition: Adequate nutrition will be maintained 03/27/2017 2022 - Completed/Met by Merla Riches, RN Elimination: Will not experience complications related to bowel motility 03/27/2017 2022 - Completed/Met by Merla Riches, RN Will not experience complications related to urinary retention 03/27/2017 2022 - Completed/Met by Merla Riches, RN

## 2017-03-28 ENCOUNTER — Other Ambulatory Visit: Payer: Self-pay

## 2017-03-28 ENCOUNTER — Encounter (HOSPITAL_COMMUNITY): Payer: Self-pay | Admitting: *Deleted

## 2017-03-28 ENCOUNTER — Ambulatory Visit (HOSPITAL_COMMUNITY): Payer: Medicare Other

## 2017-03-28 DIAGNOSIS — I5022 Chronic systolic (congestive) heart failure: Secondary | ICD-10-CM | POA: Diagnosis not present

## 2017-03-28 DIAGNOSIS — I110 Hypertensive heart disease with heart failure: Secondary | ICD-10-CM | POA: Diagnosis not present

## 2017-03-28 DIAGNOSIS — Z006 Encounter for examination for normal comparison and control in clinical research program: Secondary | ICD-10-CM | POA: Diagnosis not present

## 2017-03-28 DIAGNOSIS — I2510 Atherosclerotic heart disease of native coronary artery without angina pectoris: Secondary | ICD-10-CM | POA: Diagnosis not present

## 2017-03-28 DIAGNOSIS — I447 Left bundle-branch block, unspecified: Secondary | ICD-10-CM | POA: Diagnosis not present

## 2017-03-28 DIAGNOSIS — I428 Other cardiomyopathies: Secondary | ICD-10-CM | POA: Diagnosis not present

## 2017-03-28 DIAGNOSIS — I420 Dilated cardiomyopathy: Secondary | ICD-10-CM | POA: Diagnosis not present

## 2017-03-28 DIAGNOSIS — J9811 Atelectasis: Secondary | ICD-10-CM | POA: Diagnosis not present

## 2017-03-28 DIAGNOSIS — Z882 Allergy status to sulfonamides status: Secondary | ICD-10-CM | POA: Diagnosis not present

## 2017-03-28 LAB — TROPONIN I

## 2017-03-28 NOTE — Progress Notes (Signed)
Around 19:47, pt suddenly experienced sharp pain of 7/10 in the middle of his chest and back, which became worse with a deep breath, and developed a light sheen of sweat. BP 152/86, HR 96. An EKG was performed and showed no abnormality. The on call cardiologist was informed of the event and ordered a STAT portable chest x-ray, 1 tab Percocet every 6 hours as needed for severe pain, and Nitro sublingual tab 0.4 every 5 min PRN for chest pain.  The pt has received a percocet and 3 nitro tabs, and his pain has gradually gone down to a level 4. Pt is calmer.  Cardiologist was informed and said to not give more nitro and just control the pain with percocet.  Pt is resting in bed.  Will continue to monitor.  Lupita Dawn, RN

## 2017-04-03 HISTORY — PX: PACEMAKER INSERTION: SHX728

## 2017-04-07 ENCOUNTER — Ambulatory Visit: Payer: Medicare Other

## 2017-04-09 ENCOUNTER — Ambulatory Visit (INDEPENDENT_AMBULATORY_CARE_PROVIDER_SITE_OTHER): Payer: Medicare Other | Admitting: *Deleted

## 2017-04-09 DIAGNOSIS — R0789 Other chest pain: Secondary | ICD-10-CM

## 2017-04-09 DIAGNOSIS — I5022 Chronic systolic (congestive) heart failure: Secondary | ICD-10-CM | POA: Diagnosis not present

## 2017-04-09 DIAGNOSIS — Z95810 Presence of automatic (implantable) cardiac defibrillator: Secondary | ICD-10-CM | POA: Diagnosis not present

## 2017-04-09 DIAGNOSIS — I420 Dilated cardiomyopathy: Secondary | ICD-10-CM | POA: Diagnosis not present

## 2017-04-09 LAB — CUP PACEART INCLINIC DEVICE CHECK
Battery Remaining Longevity: 81 mo
Brady Statistic AP VP Percent: 0.5 %
Brady Statistic AS VP Percent: 97.18 %
Brady Statistic AS VS Percent: 2.3 %
Brady Statistic RA Percent Paced: 0.52 %
Brady Statistic RV Percent Paced: 26.3 %
HighPow Impedance: 60 Ohm
Implantable Lead Implant Date: 20181108
Implantable Lead Implant Date: 20181108
Implantable Lead Location: 753858
Implantable Lead Location: 753860
Implantable Lead Model: 4298
Implantable Pulse Generator Implant Date: 20181108
Lead Channel Impedance Value: 172.541
Lead Channel Impedance Value: 193.455
Lead Channel Impedance Value: 228 Ohm
Lead Channel Impedance Value: 261.164
Lead Channel Impedance Value: 456 Ohm
Lead Channel Impedance Value: 475 Ohm
Lead Channel Impedance Value: 513 Ohm
Lead Channel Impedance Value: 532 Ohm
Lead Channel Impedance Value: 665 Ohm
Lead Channel Impedance Value: 722 Ohm
Lead Channel Impedance Value: 779 Ohm
Lead Channel Impedance Value: 874 Ohm
Lead Channel Pacing Threshold Amplitude: 0.5 V
Lead Channel Pacing Threshold Amplitude: 0.75 V
Lead Channel Pacing Threshold Pulse Width: 0.4 ms
Lead Channel Sensing Intrinsic Amplitude: 3.25 mV
Lead Channel Setting Pacing Amplitude: 3.5 V
Lead Channel Setting Pacing Pulse Width: 0.4 ms
Lead Channel Setting Sensing Sensitivity: 0.3 mV
MDC IDC LEAD IMPLANT DT: 20181108
MDC IDC LEAD LOCATION: 753859
MDC IDC MSMT BATTERY VOLTAGE: 3.09 V
MDC IDC MSMT LEADCHNL LV IMPEDANCE VALUE: 190.884
MDC IDC MSMT LEADCHNL LV IMPEDANCE VALUE: 304 Ohm
MDC IDC MSMT LEADCHNL LV IMPEDANCE VALUE: 399 Ohm
MDC IDC MSMT LEADCHNL LV IMPEDANCE VALUE: 532 Ohm
MDC IDC MSMT LEADCHNL LV IMPEDANCE VALUE: 589 Ohm
MDC IDC MSMT LEADCHNL LV IMPEDANCE VALUE: 646 Ohm
MDC IDC MSMT LEADCHNL LV PACING THRESHOLD AMPLITUDE: 2.25 V
MDC IDC MSMT LEADCHNL LV PACING THRESHOLD PULSEWIDTH: 0.5 ms
MDC IDC MSMT LEADCHNL RV PACING THRESHOLD PULSEWIDTH: 0.4 ms
MDC IDC MSMT LEADCHNL RV SENSING INTR AMPL: 10.875 mV
MDC IDC SESS DTM: 20181121145439
MDC IDC SET LEADCHNL LV PACING AMPLITUDE: 3.75 V
MDC IDC SET LEADCHNL LV PACING PULSEWIDTH: 0.5 ms
MDC IDC SET LEADCHNL RV PACING AMPLITUDE: 3.5 V
MDC IDC STAT BRADY AP VS PERCENT: 0.02 %

## 2017-04-09 MED ORDER — COLCHICINE 0.6 MG PO TABS: 0.6000 mg | ORAL_TABLET | Freq: Two times a day (BID) | ORAL | 0 refills | Status: DC

## 2017-04-09 NOTE — Progress Notes (Signed)
Wound check appointment. Steri-strips removed. Wound without redness or edema. Incision edges approximated, wound well healed. Normal device function. Thresholds, sensing, and impedances consistent with implant measurements (LV threshold 1.75V @ 0.43ms at implant, 2.25V @ 0.60ms today, impedance stable). Device programmed at 3.5V for extra safety margin (RA and RV) until 3 month visit, LV output at 3.75V @ 0.38ms (on adaptive). Histogram distribution appropriate for patient and level of activity. No mode switches or ventricular arrhythmias noted. Patient educated about wound care, arm mobility, lifting restrictions, and shock plan. ROV with WC on 07/09/17.  Patient reports chest/sternal pain x4 days beginning on 11/8 while still hospitalized post-implant. Pain was not totally resolved by either SL nitro or Percocet per patient. Per MD notes from hospitalization, CXR, troponin, EKG, and cardiac cath (from 12/2016) were all WNL. Patient reports that this pain has largely resolved, but he still notes dull (2/10) substernal discomfort when lying flat. Patient denies ShOB, pain with deep breathing, or activity/position limitation other than lying flat. Reviewed verbally with Dr. Curt Bears, received verbal orders for colchicine 0.6mg  BID x7 days (reducing to once daily if patient notes diarrhea/side effects), with plan for sooner OV with Dr. Curt Bears if discomfort is not resolved. Patient is aware to call our office or seek emergency medical attention with new or worsening symptoms, or if symptoms do not resolve with course of colchicine. Patient verbalizes understanding of all instructions and denies additional questions or concerns at this time.

## 2017-04-09 NOTE — Patient Instructions (Addendum)
Medication Instructions:  --START colchicine 0.6mg  two times daily for 7 days  Labwork: N/A  Testing/Procedures: N/A  Follow-Up: --Follow-up on 07/09/17 at 8:30am with Dr. Curt Bears.   Any Other Special Instructions Will Be Listed Below (If Applicable). Per Dr. Curt Bears, decrease your dose of colchicine to 0.6mg  daily if you notice side effects (such as diarrhea).  Call the [facility] at [phone] if your chest wall discomfort is not resolved by the 7 day course of colchicine.    If you need a refill on your cardiac medications before your next appointment, please call your pharmacy.

## 2017-04-09 NOTE — Progress Notes (Signed)
Office Visit Note  Patient: [name]             Date of Birth: 01-Nov-1945           MRN: [id number]             PCP: [name], MD Referring: [name], MD Visit Date: 04/22/2017 Occupation: @GUAROCC @    Subjective:  Osteoarthritis (BIL hand and knee pain )   History of Present Illness: [name] is a 71 y.o. male with history of osteoarthritis and disc disease. He states in November he underwent pacemaker placement and defibrillator placement for right bundle branch block. He still recovering from that. He continues to have pain and discomfort in his bilateral knee joints. His right knee joint has been replaced. He continues to have pain and discomfort in his bilateral hands and bilateral feet. Lower back continues to hurt. He's been taking tramadol 1 tablet a day and nabumetone 2 tablets a day.  Activities of Daily Living:  Patient reports morning stiffness for all day hours.   Patient Reports nocturnal pain.  Difficulty dressing/grooming: Denies Difficulty climbing stairs: Reports Difficulty getting out of chair: Reports Difficulty using hands for taps, buttons, cutlery, and/or writing: Reports   Review of Systems  Constitutional: Positive for fatigue. Negative for night sweats and weakness ( ).  HENT: Positive for mouth dryness. Negative for mouth sores and nose dryness.   Eyes: Positive for dryness. Negative for redness.  Respiratory: Positive for shortness of breath. Negative for difficulty breathing.   Cardiovascular: Positive for hypertension. Negative for chest pain, palpitations, irregular heartbeat and swelling in legs/feet.  Gastrointestinal: Negative for constipation and diarrhea.  Endocrine: Negative for increased urination.  Musculoskeletal: Positive for arthralgias, joint pain, myalgias, morning stiffness and myalgias. Negative for joint swelling, muscle weakness and muscle tenderness.  Skin: Negative for color change, rash, hair loss,  nodules/bumps, skin tightness, ulcers and sensitivity to sunlight.  Allergic/Immunologic: Negative for susceptible to infections.  Neurological: Negative for dizziness, fainting, memory loss and night sweats.  Hematological: Negative for swollen glands.  Psychiatric/Behavioral: Positive for sleep disturbance. Negative for depressed mood. The patient is not nervous/anxious.     PMFS History:  Patient Active Problem List   Diagnosis Date Noted  . CHF (congestive heart failure) ([place]) 03/27/2017  . DCM (dilated cardiomyopathy) ([place]) 02/11/2017  . Chronic systolic heart failure ([place]) 01/15/2017  . History of pneumonia 10/23/2016  . Osteoarthritis 10/23/2016  . Primary osteoarthritis of both feet 10/04/2016  . Primary osteoarthritis of left knee 10/04/2016  . DDD (degenerative disc disease), lumbar 10/04/2016  . Hemoptysis 05/27/2016  . Overweight (BMI 25.0-29.9) 03/14/2016  . 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  . Dyspnea 11/29/2014  . Synovial cyst of lumbar facet joint 09/26/2014  . Incisional hernia, without obstruction or gangrene 12/06/2013  . Nausea alone 08/27/2013  . Colon cancer screening 08/27/2013  . 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 Benign prostatic hyperplasia with urinary obstruction 06/18/2010  . OTHER SPECIFIED DISORDER OF STOMACH AND DUODENUM 12/16/2008  . BENIGN NEOPLASM OF LIVER AND BILIARY PASSAGES 11/15/2008  . GERD 09/02/2008  . COLONIC POLYPS, HYPERPLASTIC 05/24/2007  . Obstructive sleep apnea 05/24/2007  . ALLERGIC RHINITIS 05/24/2007  . ESOPHAGEAL STRICTURE 05/24/2007  . HIATAL HERNIA 05/24/2007  . Hyperlipidemia 02/06/2007  . Essential hypertension 02/06/2007  . CAD (coronary artery disease) 02/06/2007  . Primary osteoarthritis of both hands 02/06/2007  . Backache 02/06/2007    Past Medical History:  Diagnosis Date  . Allergic  rhinitis   . Anginal pain ([place]) 05/26/14   chest pain after chasing dog  . CAD (coronary artery disease)    hx of stent- 2005 RCA    . CHF (congestive heart failure) ([place])   . Chronic back pain    intermittent  . Constipation   . Dizziness   . Dysrhythmia    right bundle branch block   . Esophageal stricture   . GERD (gastroesophageal reflux disease)   . Hemoptysis   . Hiatal hernia   . History of colonic polyps    hyperplastic  . HTN (hypertension)   . Hyperlipidemia   . Hypertrophy of prostate with urinary obstruction and other lower urinary tract symptoms (LUTS)   . OA (osteoarthritis)   . OSA (obstructive sleep apnea)    cpap- 10   . 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 Pneumonia   . Shortness of breath    with exertion  . Testicular hypofunction     Family History  Problem Relation Age of Onset  . Coronary artery disease Father   . Diabetes Father   . Sudden death Father        due to heart disease  . Heart disease Father   . Heart disease Mother   . Hypertension Unknown   . Stomach cancer Paternal Grandmother   . COPD Brother   . Arthritis Brother   . Heart failure Brother   . Arthritis Daughter   . Colon cancer Neg Hx   . Esophageal cancer Neg Hx    Past Surgical History:  Procedure Laterality Date  . BIV ICD INSERTION CRT-D N/A 03/27/2017   Procedure: BIV ICD INSERTION CRT-D;  Surgeon: Constance Haw, MD;  Location: [facility];  Service: Cardiovascular;  Laterality: N/A;  . CARDIAC CATHETERIZATION     '05, last 2009, showing patent RCA stent  . COLONOSCOPY  12/2007   HYPERPLASTIC POLYP  . COLONOSCOPY WITH PROPOFOL N/A 06/02/2014   Procedure: COLONOSCOPY WITH PROPOFOL;  Surgeon: [name], MD;  Location: [facility];  Service: Endoscopy;  Laterality: N/A;  . CORONARY ANGIOPLASTY  08/2003  . ESOPHAGOGASTRODUODENOSCOPY (EGD) WITH PROPOFOL N/A 06/02/2014   Procedure: ESOPHAGOGASTRODUODENOSCOPY (EGD) WITH PROPOFOL;  Surgeon: [name], MD;  Location: [facility];  Service:  Endoscopy;  Laterality: N/A;  . hernia surgery x 3     Bilateral Inguinal, Umbicial  . IRRIGATION AND DEBRIDEMENT ABSCESS Left 08/14/2012   Procedure: IRRIGATION AND DEBRIDEMENT LEFT INGUINAL BOIL ;  Surgeon: Ailene Rud, MD;  Location: [facility];  Service: Urology;  Laterality: Left;  . JOINT REPLACEMENT Right 2017  . KNEE ARTHROPLASTY    . LEFT HEART CATHETERIZATION WITH CORONARY ANGIOGRAM N/A 05/26/2014   Procedure: LEFT HEART CATHETERIZATION WITH CORONARY ANGIOGRAM;  Surgeon: Blane Ohara, MD;  Location: [facility];  Service: Cardiovascular;  Laterality: N/A;  . LUMBAR LAMINECTOMY/DECOMPRESSION MICRODISCECTOMY Left 09/26/2014   Procedure: Lumbar Laminectomy for resection of synovial cyst  Lumbar five- sacral one left;  Surgeon: Kary Kos, MD;  Location: [facility];  Service: Neurosurgery;  Laterality: Left;  . Oral surg to removed growth from ?sinus  10/2010   ?Dermoid removed by DrRiggs  . PACEMAKER INSERTION  04/03/2017  . RCA stenting     '05 RCA  . right toe surgery  Right    Cyst   . RIGHT/LEFT HEART CATH AND CORONARY ANGIOGRAPHY N/A 01/15/2017  Procedure: RIGHT/LEFT HEART CATH AND CORONARY ANGIOGRAPHY;  Surgeon: [name], MD;  Location: [facility];  Service: Cardiovascular;  Laterality: N/A;  . s/p right knee arthroscopy  2005  . TOTAL KNEE ARTHROPLASTY Right 03/12/2016   Procedure: RIGHT TOTAL KNEE ARTHROPLASTY;  Surgeon: Paralee Cancel, MD;  Location: [facility];  Service: Orthopedics;  Laterality: Right;  . UPPER GASTROINTESTINAL ENDOSCOPY     Social History   Social History Narrative  . Not on file     Objective: Vital Signs: BP (!) 113/56 (BP Location: Left Arm, Patient Position: Sitting, Cuff Size: Normal)   Pulse 82   Resp 18   Ht 5\' 8"  (1.727 m)   Wt 189 lb (85.7 kg)   BMI 28.74 kg/m    Physical Exam  Constitutional: He is oriented to person, place, and time. He appears well-developed and well-nourished.  HENT:  Head: Normocephalic and  atraumatic.  Eyes: Conjunctivae and EOM are normal. Pupils are equal, round, and reactive to light.  Neck: Normal range of motion. Neck supple.  Cardiovascular: Normal rate, regular rhythm and normal heart sounds.  Pacemaker and defibrillator placement  Pulmonary/Chest: Effort normal and breath sounds normal.  Abdominal: Soft. Bowel sounds are normal.  Neurological: He is alert and oriented to person, place, and time.  Skin: Skin is warm and dry. Capillary refill takes less than 2 seconds.  Psychiatric: He has a normal mood and affect. His behavior is normal.  Nursing note and vitals reviewed.    Musculoskeletal Exam: c spine good range in motion. Lumbar spine limited ROM. Shoulders, elbows, wrists are good ROM. PIP and DIP severe thickeningand subluxation. Right TKR painful ROM. Left KJ painful ROM, no warmth swelling or effusion.  CDAI Exam: No CDAI exam completed.    Investigation: No additional findings. UDS: 06/17/16 Narc Agreement: 06/17/16 CBC Latest Ref Rng & Units 03/20/2017 01/15/2017 10/23/2016  WBC 3.4 - 10.8 x10E3/uL 5.6 6.1 6.7  Hemoglobin 13.0 - 17.7 g/dL 15.5 14.4 14.7  Hematocrit 37.5 - 51.0 % 45.1 43.7 43.6  Platelets 150 - 379 x10E3/uL 256 224 242.0   CMP Latest Ref Rng & Units 03/20/2017 02/26/2017 02/12/2017  Glucose 65 - 99 mg/dL 101(H) 127(H) 94  BUN 8 - 27 mg/dL 17 16 18   Creatinine 0.76 - 1.27 mg/dL 0.94 0.95 0.82  Sodium 134 - 144 mmol/L 139 139 138  Potassium 3.5 - 5.2 mmol/L 4.7 4.4 4.7  Chloride 96 - 106 mmol/L 101 104 102  CO2 20 - 29 mmol/L 23 21 20   Calcium 8.6 - 10.2 mg/dL 9.9 9.5 9.7  Total Protein 6.0 - 8.3 g/dL - - -  Total Bilirubin 0.2 - 1.2 mg/dL - - -  Alkaline Phos 39 - 117 U/L - - -  AST 0 - 37 U/L - - -  ALT 0 - 53 U/L - - -    Imaging: Dg Chest 2 View  Result Date: 03/28/2017 CLINICAL DATA:  Recent defibrillator placement EXAM: CHEST  2 VIEW COMPARISON:  03/27/2017 FINDINGS: Defibrillator is again seen and relatively stable in  appearance given some patient rotation to the left. Cardiac shadow is stable. No pneumothorax is seen. Mild bibasilar atelectasis is again noted and stable. No acute bony abnormality is noted. IMPRESSION: No evidence of pneumothorax. No acute abnormality noted.  Stable bibasilar atelectasis is seen. Electronically Signed   By: Inez Catalina M.D.   On: 03/28/2017 08:05   Dg Chest Port 1 View  Result Date: 03/27/2017 CLINICAL DATA:  Dyspnea/ICD  placed today eval for pneumothorax EXAM: PORTABLE CHEST 1 VIEW COMPARISON:  Chest x-ray dated 10/23/2016. FINDINGS: Left chest wall ICD now in place with appropriate appearance of the lead positioning. No pneumothorax seen. Study is hypoinspiratory with crowding of the perihilar and bibasilar bronchovascular markings. Stable opacity at the left lung base, compatible with the chronic lingular scarring/atelectasis demonstrated on earlier chest CT. Given the low lung volumes, right lung appears clear. Heart size is upper normal, stable. IMPRESSION: No active disease.  Status post ICD placement.  No pneumothorax. Electronically Signed   By: Franki Cabot M.D.   On: 03/27/2017 20:41    Speciality Comments: No specialty comments available.    Procedures:  No procedures performed Allergies: Sulfamethoxazole-trimethoprim and Sulfonamide derivatives   Assessment / Plan:     Visit Diagnoses: Primary osteoarthritis of both hands - He has severe osteoarthritis in his hands with DIP PIP subluxation. Joint protection and muscle strengthening discussed.  Primary osteoarthritis of left knee - he continues to have a lot of discomfort. He states he had Visco supplement injections at [facility] and he was not satisfied with that. He wants to get viscous supplement injections again. We will apply for that.  Status post right knee replacement - he continues to have discomfort in his right total knee which is been replaced. He states Dr. Alvan Dame suggested debridement of patellar  surface but he declined.  Primary osteoarthritis of both feet: Proper fitting shoes were discussed.  DDD (degenerative disc disease), lumbar: Chronic pain.  Other chronic pain - Tramadol 50 mg by mouth daily UDS: 1/29/18Narc Agreement: 06/17/16. We will resume his UDS today.  History of hypertension  History of CHF (congestive heart failure)  Other cardiac arrhythmia - RBBB s/p Pacemaker and defibrillator placement 04/03/2017  History of coronary artery disease  History of hyperlipidemia  History of gastric polyp  History of colon polyps  History of gastroesophageal reflux (GERD)  History of BPH  History of sleep apnea  Medication monitoring encounter - Plan: Pain Mgmt, Profile 5 w/Conf, U, Pain Mgmt, Tramadol w/medMATCH, U    Orders: Orders Placed This Encounter  Procedures  . Pain Mgmt, Profile 5 w/Conf, U  . Pain Mgmt, Tramadol w/medMATCH, U   No orders of the defined types were placed in this encounter.   Face-to-face time spent with patient was 30 minutes. For greater than 50% of time was spent in counseling and coordination of care.  Follow-Up Instructions: Return in about 6 months (around 10/21/2017) for Osteoarthritis.   Bo Merino, MD  Note - This record has been created using Editor, commissioning.  Chart creation errors have been sought, but may not always  have been located. Such creation errors do not reflect on  the standard of medical care.

## 2017-04-14 ENCOUNTER — Telehealth: Payer: Self-pay | Admitting: *Deleted

## 2017-04-14 NOTE — Telephone Encounter (Signed)
Spoke with patient.  He has been able to tolerate colchicine 0.6mg  BID and feels that his substernal discomfort has improved.  Offered f/u appointment with Dr. Curt Bears tomorrow.  Patient declines at this time as he is feeling better, but states he will think about it and call back if he changes his mind.  [facility].  Patient appreciative of call and denies questions or concerns at this time.

## 2017-04-22 ENCOUNTER — Encounter: Payer: Self-pay | Admitting: Rheumatology

## 2017-04-22 ENCOUNTER — Ambulatory Visit (INDEPENDENT_AMBULATORY_CARE_PROVIDER_SITE_OTHER): Payer: Medicare Other | Admitting: Rheumatology

## 2017-04-22 VITALS — BP 113/56 | HR 82 | Resp 18 | Ht 68.0 in | Wt 189.0 lb

## 2017-04-22 DIAGNOSIS — Z8719 Personal history of other diseases of the digestive system: Secondary | ICD-10-CM | POA: Diagnosis not present

## 2017-04-22 DIAGNOSIS — M5136 Other intervertebral disc degeneration, lumbar region: Secondary | ICD-10-CM | POA: Diagnosis not present

## 2017-04-22 DIAGNOSIS — M51369 Other intervertebral disc degeneration, lumbar region without mention of lumbar back pain or lower extremity pain: Secondary | ICD-10-CM

## 2017-04-22 DIAGNOSIS — Z96651 Presence of right artificial knee joint: Secondary | ICD-10-CM

## 2017-04-22 DIAGNOSIS — M1712 Unilateral primary osteoarthritis, left knee: Secondary | ICD-10-CM

## 2017-04-22 DIAGNOSIS — I498 Other specified cardiac arrhythmias: Secondary | ICD-10-CM

## 2017-04-22 DIAGNOSIS — Z8679 Personal history of other diseases of the circulatory system: Secondary | ICD-10-CM

## 2017-04-22 DIAGNOSIS — Z5181 Encounter for therapeutic drug level monitoring: Secondary | ICD-10-CM

## 2017-04-22 DIAGNOSIS — Z8639 Personal history of other endocrine, nutritional and metabolic disease: Secondary | ICD-10-CM | POA: Diagnosis not present

## 2017-04-22 DIAGNOSIS — M19042 Primary osteoarthritis, left hand: Secondary | ICD-10-CM

## 2017-04-22 DIAGNOSIS — Z87438 Personal history of other diseases of male genital organs: Secondary | ICD-10-CM | POA: Diagnosis not present

## 2017-04-22 DIAGNOSIS — M19072 Primary osteoarthritis, left ankle and foot: Secondary | ICD-10-CM

## 2017-04-22 DIAGNOSIS — M19071 Primary osteoarthritis, right ankle and foot: Secondary | ICD-10-CM

## 2017-04-22 DIAGNOSIS — M19041 Primary osteoarthritis, right hand: Secondary | ICD-10-CM | POA: Diagnosis not present

## 2017-04-22 DIAGNOSIS — G8929 Other chronic pain: Secondary | ICD-10-CM

## 2017-04-22 DIAGNOSIS — I2510 Atherosclerotic heart disease of native coronary artery without angina pectoris: Secondary | ICD-10-CM

## 2017-04-22 DIAGNOSIS — Z860100 Personal history of colon polyps, unspecified: Secondary | ICD-10-CM

## 2017-04-22 DIAGNOSIS — Z8669 Personal history of other diseases of the nervous system and sense organs: Secondary | ICD-10-CM

## 2017-04-24 LAB — PAIN MGMT, PROFILE 5 W/CONF, U
Amphetamines: NEGATIVE ng/mL (ref <500)
BENZODIAZEPINES: NEGATIVE ng/mL (ref <100)
Barbiturates: NEGATIVE ng/mL (ref <300)
CREATININE: 27.3 mg/dL
Cocaine Metabolite: NEGATIVE ng/mL (ref <150)
Marijuana Metabolite: NEGATIVE ng/mL (ref <20)
Methadone Metabolite: NEGATIVE ng/mL (ref <100)
OXIDANT: NEGATIVE ug/mL (ref <200)
OXYCODONE: NEGATIVE ng/mL (ref <100)
Opiates: NEGATIVE ng/mL (ref <100)
pH: 6.64 (ref 4.5–9.0)

## 2017-04-24 LAB — PAIN MGMT, TRAMADOL W/MEDMATCH, U
Desmethyltramadol: 1451 ng/mL — ABNORMAL HIGH (ref <100)
Tramadol: 2256 ng/mL — ABNORMAL HIGH (ref <100)

## 2017-04-25 NOTE — Progress Notes (Signed)
Consistent with treatment

## 2017-05-16 ENCOUNTER — Other Ambulatory Visit: Payer: Self-pay | Admitting: Cardiology

## 2017-05-16 MED ORDER — ATORVASTATIN CALCIUM 20 MG PO TABS: 20.0000 mg | ORAL_TABLET | Freq: Every day | ORAL | 3 refills | Status: DC

## 2017-05-23 ENCOUNTER — Other Ambulatory Visit: Payer: Self-pay | Admitting: Cardiology

## 2017-05-23 MED ORDER — CARVEDILOL 12.5 MG PO TABS: 12.5000 mg | ORAL_TABLET | Freq: Two times a day (BID) | ORAL | 3 refills | Status: DC

## 2017-06-02 ENCOUNTER — Other Ambulatory Visit: Payer: Self-pay | Admitting: *Deleted

## 2017-06-02 MED ORDER — NABUMETONE 500 MG PO TABS: 500.0000 mg | ORAL_TABLET | Freq: Two times a day (BID) | ORAL | 0 refills | Status: DC

## 2017-06-02 NOTE — Telephone Encounter (Signed)
Last Visit: 04/22/17 Next Visit: 10/22/17 Labs: 03/20/17 WNL  Okay to refill per Dr. Estanislado Pandy

## 2017-06-06 ENCOUNTER — Telehealth: Payer: Self-pay | Admitting: Cardiovascular Disease

## 2017-06-06 NOTE — Telephone Encounter (Signed)
New message     [facility] lost his medication, he needs a paper prescription for 7 days worth  That he can pick up immediately, so  that he can take to local pharmacy before he leaves town today  carvedilol (COREG) 12.5 MG tablet Take 1 tablet (12.5 mg total) by mouth 2 (two) times daily with a meal.   sacubitril-valsartan (ENTRESTO) 49-51 MG Take 1 tablet by mouth 2 (two) times daily.

## 2017-06-06 NOTE — Telephone Encounter (Signed)
Patient states his Rx mail order made an error and he is not receiving his medications prior to going out of town. He requests written Rx for Coreg and Entresto placed at the front desk for him to pick up prior to leaving. Rx for Coreg and Entresto samples placed at front desk for patient pick up.

## 2017-06-10 ENCOUNTER — Encounter: Payer: Self-pay | Admitting: Gastroenterology

## 2017-06-19 ENCOUNTER — Ambulatory Visit (INDEPENDENT_AMBULATORY_CARE_PROVIDER_SITE_OTHER): Payer: Medicare Other | Admitting: Cardiovascular Disease

## 2017-06-19 ENCOUNTER — Encounter: Payer: Self-pay | Admitting: Cardiovascular Disease

## 2017-06-19 VITALS — BP 126/70 | HR 78 | Ht 68.0 in | Wt 190.4 lb

## 2017-06-19 DIAGNOSIS — I5022 Chronic systolic (congestive) heart failure: Secondary | ICD-10-CM | POA: Diagnosis not present

## 2017-06-19 DIAGNOSIS — I2510 Atherosclerotic heart disease of native coronary artery without angina pectoris: Secondary | ICD-10-CM

## 2017-06-19 NOTE — Patient Instructions (Signed)
Medication Instructions:  Your provider recommends that you continue on your current medications as directed. Please refer to the Current Medication list given to you today.    Labwork: None  Testing/Procedures: Your provider has requested that you have an echocardiogram. Echocardiography is a painless test that uses sound waves to create images of your heart. It provides your doctor with information about the size and shape of your heart and how well your heart's chambers and valves are working. This procedure takes approximately one hour. There are no restrictions for this procedure.  Follow-Up: Your provider wants you to follow-up in: 6 months with Dr. Cooper. You will receive a reminder letter in the mail two months in advance. If you don't receive a letter, please call our office to schedule the follow-up appointment.    Any Other Special Instructions Will Be Listed Below (If Applicable).     If you need a refill on your cardiac medications before your next appointment, please call your pharmacy.   

## 2017-06-19 NOTE — Progress Notes (Signed)
Cardiology Office Note Date:  06/19/2017   ID:  [name], DOB 1946-04-15, MRN [id number]  PCP:  [name], MD  Cardiologist:  [name], MD    Chief Complaint  Patient presents with  . 3 month follow up    s/p Bi-V ICD implant     History of Present Illness: [name] is a 72 y.o. male who presents for follow-up evaluation.  He is followed for chronic systolic heart failure secondary to nonischemic cardiomyopathy, coronary artery disease with history of RCA stenting, left bundle branch block, hypertension, hyperlipidemia, and sleep apnea.  His most recent catheterization in 2018 demonstrated patency of his RCA stent site and mild nonobstructive disease elsewhere.  Right heart catheterization demonstrated well compensated hemodynamics.  Because of severe residual LV dysfunction and he has undergone CRT-D in November 2018.  Notes he developed severe chest pain the evening of his device implantation.  A chest x-ray showed no evidence of pneumothorax.  Symptoms resolved over a period of about a week.  He has felt fine since that time.  He is here alone today. Discouraged about gaining more weight. He is quite limited by his knee problems and is unable to exercise. We trended his weights today and he has creeped up over the last 5 years. He attributes this to back surgery, knee problems, and inability to exercise secondary to this. He used to walk 15,000 steps/day.  He reports no specific cardiac symptoms.  Continues to have fatigue.  No shortness of breath, recurrent chest pain, edema, orthopnea, or PND.  States that he is really not doing much activity as detailed above.  He is compliant with his medications.  Past Medical History:  Diagnosis Date  . Allergic rhinitis   . Anginal pain ([place]) 05/26/14   chest pain after chasing dog  . CAD (coronary artery disease)    hx of stent- 2005 RCA  . CHF (congestive heart failure) ([place])   . Chronic back pain    intermittent  .  Constipation   . Dizziness   . Dysrhythmia    right bundle branch block   . Esophageal stricture   . GERD (gastroesophageal reflux disease)   . Hemoptysis   . Hiatal hernia   . History of colonic polyps    hyperplastic  . HTN (hypertension)   . Hyperlipidemia   . Hypertrophy of prostate with urinary obstruction and other lower urinary tract symptoms (LUTS)   . OA (osteoarthritis)   . OSA (obstructive sleep apnea)    cpap- 10   . 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 Pneumonia   . Shortness of breath    with exertion  . Testicular hypofunction     Past Surgical History:  Procedure Laterality Date  . BIV ICD INSERTION CRT-D N/A 03/27/2017   Procedure: BIV ICD INSERTION CRT-D;  Surgeon: Constance Haw, MD;  Location: [facility];  Service: Cardiovascular;  Laterality: N/A;  . CARDIAC CATHETERIZATION     '05, last 2009, showing patent RCA stent  . COLONOSCOPY  12/2007   HYPERPLASTIC POLYP  . COLONOSCOPY WITH PROPOFOL N/A 06/02/2014   Procedure: COLONOSCOPY WITH PROPOFOL;  Surgeon: [name], MD;  [facility]: [facility];  Service: Endoscopy;  Laterality: N/A;  . CORONARY ANGIOPLASTY  08/2003  . ESOPHAGOGASTRODUODENOSCOPY (EGD) WITH PROPOFOL N/A 06/02/2014   Procedure: ESOPHAGOGASTRODUODENOSCOPY (EGD) WITH PROPOFOL;  Surgeon: Melene Plan  Ardis Hughs, MD;  Location: [facility];  Service: Endoscopy;  Laterality: N/A;  . hernia surgery x 3     Bilateral Inguinal, Umbicial  . IRRIGATION AND DEBRIDEMENT ABSCESS Left 08/14/2012   Procedure: IRRIGATION AND DEBRIDEMENT LEFT INGUINAL BOIL ;  Surgeon: Ailene Rud, MD;  Location: [facility];  Service: Urology;  Laterality: Left;  . JOINT REPLACEMENT Right 2017  . KNEE ARTHROPLASTY    . LEFT HEART CATHETERIZATION WITH CORONARY ANGIOGRAM N/A 05/26/2014   Procedure: LEFT HEART CATHETERIZATION WITH CORONARY ANGIOGRAM;  Surgeon: Blane Ohara,  MD;  [facility];  Service: Cardiovascular;  Laterality: N/A;  . LUMBAR LAMINECTOMY/DECOMPRESSION MICRODISCECTOMY Left 09/26/2014   Procedure: Lumbar Laminectomy for resection of synovial cyst  Lumbar five- sacral one left;  Surgeon: Kary Kos, MD;  Location: [facility];  Service: Neurosurgery;  Laterality: Left;  . Oral surg to removed growth from ?sinus  10/2010   ?Dermoid removed by DrRiggs  . PACEMAKER INSERTION  04/03/2017  . RCA stenting     '05 RCA  . right toe surgery  Right    Cyst   . RIGHT/LEFT HEART CATH AND CORONARY ANGIOGRAPHY N/A 01/15/2017   Procedure: RIGHT/LEFT HEART CATH AND CORONARY ANGIOGRAPHY;  Surgeon: [name], MD;  Location: [facility];  Service: Cardiovascular;  Laterality: N/A;  . s/p right knee arthroscopy  2005  . TOTAL KNEE ARTHROPLASTY Right 03/12/2016   Procedure: RIGHT TOTAL KNEE ARTHROPLASTY;  Surgeon: Paralee Cancel, MD;  [facility]: [facility];  Service: Orthopedics;  Laterality: Right;  . UPPER GASTROINTESTINAL ENDOSCOPY      Current Outpatient Medications  Medication Sig Dispense Refill  . aspirin EC 81 MG tablet Take 1 tablet (81 mg total) by mouth daily.    Marland Kitchen atorvastatin (LIPITOR) 20 MG tablet Take 1 tablet (20 mg total) by mouth daily at 6 PM. 90 tablet 3  . b complex vitamins tablet Take 1 tablet by mouth daily with lunch.     . calcium carbonate (OSCAL) 1500 (600 Ca) MG TABS tablet Take 600 mg by mouth every evening.    . carvedilol (COREG) 12.5 MG tablet Take 1 tablet (12.5 mg total) by mouth 2 (two) times daily with a meal. 180 tablet 3  . CINNAMON PO Take 1 tablet by mouth daily with lunch.     . Coenzyme Q10 (CO Q 10) 100 MG CAPS Take 1 capsule by mouth daily with lunch.     . Cream Base (PCCA LIPODERM BASE) CREA Apply 4 mLs topically daily.    . diclofenac sodium (VOLTAREN) 1 % GEL APPLY 2 GRAMS TO AFFECTED AREA 2 TO 4 TIMES DAILY AS NEEDED FOR PAIN.  3  . folic acid (FOLVITE) 924 MCG tablet Take 400 mcg by mouth daily.       . Glucosamine HCl (GLUCOSAMINE PO) Take 1,500 mg by mouth every evening.    . hydroxypropyl methylcellulose / hypromellose (ISOPTO TEARS / GONIOVISC) 2.5 % ophthalmic solution Place 1 drop into both eyes 4 (four) times daily as needed for dry eyes.     Marland Kitchen MAGNESIUM PO Take 1 tablet by mouth daily as needed (for cramping).     . Multiple Vitamin (MULTIVITAMIN) capsule Take 1 capsule by mouth daily.      . Multiple Vitamins-Minerals (OCUVITE PO) Take 1 tablet by mouth at bedtime.    . nabumetone (RELAFEN) 500 MG tablet Take 1 tablet (500 mg total) by mouth 2 (two) times daily. 180 tablet 0  . NON  FORMULARY CPAP machine with sleep.    . Omega-3 Fatty Acids (FISH OIL) 500 MG CAPS Take 500 mg by mouth at bedtime.     . pantoprazole (PROTONIX) 40 MG tablet TAKE 1 TABLET BY MOUTH EVERY DAY BEFORE BREAKFAST 90 tablet 3  . Probiotic Product (PROBIOTIC DAILY PO) Take 1 capsule by mouth daily with lunch.     . sacubitril-valsartan (ENTRESTO) 49-51 MG Take 1 tablet by mouth 2 (two) times daily. 180 tablet 3  . Saw Palmetto, Serenoa repens, (SAW PALMETTO PO) Take 1 capsule by mouth every evening.    Marland Kitchen spironolactone (ALDACTONE) 25 MG tablet Take 0.5 tablets (12.5 mg total) by mouth daily. 15 tablet 11  . tamsulosin (FLOMAX) 0.4 MG CAPS capsule Take 0.4 mg by mouth at bedtime.    . traMADol (ULTRAM) 50 MG tablet Take 50 mg by mouth daily with lunch.     . TURMERIC PO Take 1 tablet by mouth daily with lunch.      No current facility-administered medications for this visit.     Allergies:   Sulfamethoxazole-trimethoprim and Sulfonamide derivatives   Social History:  The patient  reports that he quit smoking about 27 years ago. he has never used smokeless tobacco. He reports that he drinks alcohol. He reports that he does not use drugs.   Family History:  The patient's family history includes Arthritis in his brother and daughter; COPD in his brother; Coronary artery disease in his father; Diabetes in his  father; Heart disease in his father and mother; Heart failure in his brother; Hypertension in his unknown relative; Stomach cancer in his paternal grandmother; Sudden death in his father.    ROS:  Please see the history of present illness.  Otherwise, review of systems is positive for cough, visual disturbance.  All other systems are reviewed and negative.    PHYSICAL EXAM: VS:  BP 126/70   Pulse 78   Ht 5\' 8"  (1.727 m)   Wt 190 lb 6.4 oz (86.4 kg)   BMI 28.95 kg/m  , BMI Body mass index is 28.95 kg/m. GEN: Well nourished, well developed, in no acute distress  HEENT: normal  Neck: no JVD, no masses. No carotid bruits Cardiac: RRR without murmur or gallop                Respiratory:  clear to auscultation bilaterally, normal work of breathing GI: soft, nontender, nondistended, + BS MS: no deformity or atrophy  Ext: no pretibial edema, pedal pulses 2+= bilaterally Skin: warm and dry, no rash Neuro:  Strength and sensation are intact Psych: euthymic mood, full affect  EKG:  EKG is ordered today. The ekg ordered today shows atrial sensed ventricular paced rhythm 78 bpm, QRS duration 122 ms  Recent Labs: 10/23/2016: ALT 22; TSH 3.19 11/27/2016: Magnesium 2.1; NT-Pro BNP 276 03/20/2017: BUN 17; Creatinine, Ser 0.94; Hemoglobin 15.5; Platelets 256; Potassium 4.7; Sodium 139   Lipid Panel     Component Value Date/Time   CHOL 138 11/27/2016 0914   TRIG 188 (H) 11/27/2016 0914   HDL 35 (L) 11/27/2016 0914   CHOLHDL 3.9 11/27/2016 0914   CHOLHDL 4 12/07/2015 0742   VLDL 30.6 12/07/2015 0742   LDLCALC 65 11/27/2016 0914      Wt Readings from Last 3 Encounters:  06/19/17 190 lb 6.4 oz (86.4 kg)  04/22/17 189 lb (85.7 kg)  03/27/17 185 lb (83.9 kg)     Cardiac Studies Reviewed: Cardiac catheterization 01/15/2017: Conclusion   1.  Widely patent coronary arteries with continued patency of the stented segment in the right coronary artery 2. Mild nonobstructive coronary artery  disease as detailed with mild stenosis of the proximal to mid LAD and otherwise minimal luminal irregularities 3. Well compensated right-sided cardiac hemodynamics  Recommendation: Aggressive medical therapy. Refer to EP as an outpatient for consideration of CRT-D.    2D echocardiogram 12/03/2016: Study Conclusions  - Left ventricle: The cavity size was mildly dilated. There was   mild concentric hypertrophy. Systolic function was moderately to   severely reduced. The estimated ejection fraction was in the   range of 30% to 35%. Wall motion was normal; there were no   regional wall motion abnormalities. Features are consistent with   a pseudonormal left ventricular filling pattern, with concomitant   abnormal relaxation and increased filling pressure (grade 2   diastolic dysfunction). Doppler parameters are consistent with   elevated ventricular end-diastolic filling pressure. - Aortic valve: There was trivial regurgitation. - Mitral valve: There was moderate to severe regurgitation. Valve   area by pressure half-time: 1.59 cm^2. - Left atrium: The atrium was moderately dilated. - Right ventricle: Systolic function was normal. - Pulmonic valve: There was mild regurgitation. - Pulmonary arteries: Systolic pressure was mildly increased. PA   peak pressure: 36 mm Hg (S). - Inferior vena cava: The vessel was normal in size. The   respirophasic diameter changes were in the normal range (= 50%),   consistent with normal central venous pressure.  Impressions:  - When compared to the prior study from 03/08/2015 LVEF has   decreased to 30-35% with diffuse hypokinesis and significant   septal-lateral wall dyssynchrony.   ASSESSMENT AND PLAN: 1.  Chronic systolic heart failure: NYHA functional class II symptoms.  Medical program includes Entresto, carvedilol, and Spironolactone.  No evidence of volume overload on exam.  He will continue current therapy.  I will update his echocardiogram  now that he is 3 months out from biventricular pacing.  I would like to see him back in 6 months.  2.  Coronary artery disease, native vessel, without angina: Doing well on antiplatelet therapy with aspirin and lipid-lowering with atorvastatin.  3.  Hypertension: Blood pressure is controlled on current medical program.  No changes are advised today.  4.  Hyperlipidemia: Continue atorvastatin.  Lifestyle modification discussed at length today.  He understands he needs to find some way to exercise even with his joint problems.  He has a stationary bike in his home and plans to start using it.  He is also going to work on dietary changes that we discussed today.  Current medicines are reviewed with the patient today.  The patient does not have concerns regarding medicines.  Labs/ tests ordered today include:  No orders of the defined types were placed in this encounter.   Disposition:   FU 6 months  Signed, [name], MD  06/19/2017 8:36 AM    [facility] [place], [place],   [address] Phone: [phone]; Fax: [phone]

## 2017-06-24 ENCOUNTER — Telehealth: Payer: Self-pay | Admitting: Rheumatology

## 2017-06-24 NOTE — Telephone Encounter (Signed)
Patient states doctor recommended Visco injections, and he wants to check the status of this. Patient wants to start these next week. He has a trip planned to [place] first of March. Please call to advise.

## 2017-06-24 NOTE — Telephone Encounter (Signed)
Pending approval, applied for benefits

## 2017-06-25 ENCOUNTER — Other Ambulatory Visit: Payer: Self-pay

## 2017-06-25 ENCOUNTER — Ambulatory Visit (HOSPITAL_COMMUNITY): Payer: Medicare Other | Attending: Cardiology

## 2017-06-25 DIAGNOSIS — E785 Hyperlipidemia, unspecified: Secondary | ICD-10-CM | POA: Diagnosis not present

## 2017-06-25 DIAGNOSIS — I509 Heart failure, unspecified: Secondary | ICD-10-CM | POA: Diagnosis present

## 2017-06-25 DIAGNOSIS — I2510 Atherosclerotic heart disease of native coronary artery without angina pectoris: Secondary | ICD-10-CM | POA: Diagnosis not present

## 2017-06-25 DIAGNOSIS — Z950 Presence of cardiac pacemaker: Secondary | ICD-10-CM | POA: Insufficient documentation

## 2017-06-25 DIAGNOSIS — I110 Hypertensive heart disease with heart failure: Secondary | ICD-10-CM | POA: Diagnosis not present

## 2017-06-25 DIAGNOSIS — I447 Left bundle-branch block, unspecified: Secondary | ICD-10-CM | POA: Diagnosis not present

## 2017-06-25 DIAGNOSIS — I5022 Chronic systolic (congestive) heart failure: Secondary | ICD-10-CM | POA: Insufficient documentation

## 2017-07-02 DIAGNOSIS — R3915 Urgency of urination: Secondary | ICD-10-CM | POA: Diagnosis not present

## 2017-07-02 DIAGNOSIS — E291 Testicular hypofunction: Secondary | ICD-10-CM | POA: Diagnosis not present

## 2017-07-02 DIAGNOSIS — N401 Enlarged prostate with lower urinary tract symptoms: Secondary | ICD-10-CM | POA: Diagnosis not present

## 2017-07-09 ENCOUNTER — Encounter: Payer: Self-pay | Admitting: Cardiology

## 2017-07-09 ENCOUNTER — Ambulatory Visit (INDEPENDENT_AMBULATORY_CARE_PROVIDER_SITE_OTHER): Payer: Medicare Other | Admitting: Cardiology

## 2017-07-09 VITALS — BP 126/74 | HR 86 | Ht 68.0 in | Wt 189.0 lb

## 2017-07-09 DIAGNOSIS — I5022 Chronic systolic (congestive) heart failure: Secondary | ICD-10-CM | POA: Diagnosis not present

## 2017-07-09 DIAGNOSIS — Z95810 Presence of automatic (implantable) cardiac defibrillator: Secondary | ICD-10-CM

## 2017-07-09 DIAGNOSIS — I2510 Atherosclerotic heart disease of native coronary artery without angina pectoris: Secondary | ICD-10-CM

## 2017-07-09 DIAGNOSIS — I420 Dilated cardiomyopathy: Secondary | ICD-10-CM

## 2017-07-09 DIAGNOSIS — I10 Essential (primary) hypertension: Secondary | ICD-10-CM | POA: Diagnosis not present

## 2017-07-09 DIAGNOSIS — E785 Hyperlipidemia, unspecified: Secondary | ICD-10-CM

## 2017-07-09 LAB — CUP PACEART INCLINIC DEVICE CHECK
Battery Remaining Longevity: 96 mo
Battery Voltage: 3.05 V
Brady Statistic AS VP Percent: 96.2 %
Brady Statistic AS VS Percent: 2.72 %
Brady Statistic RA Percent Paced: 1.07 %
HighPow Impedance: 65 Ohm
Implantable Lead Implant Date: 20181108
Implantable Lead Implant Date: 20181108
Implantable Lead Location: 753859
Implantable Lead Location: 753860
Implantable Lead Model: 4298
Implantable Lead Model: 5076
Lead Channel Impedance Value: 1026 Ohm
Lead Channel Impedance Value: 176 Ohm
Lead Channel Impedance Value: 212.226
Lead Channel Impedance Value: 262.136
Lead Channel Impedance Value: 304 Ohm
Lead Channel Impedance Value: 399 Ohm
Lead Channel Impedance Value: 418 Ohm
Lead Channel Impedance Value: 551 Ohm
Lead Channel Impedance Value: 589 Ohm
Lead Channel Impedance Value: 608 Ohm
Lead Channel Impedance Value: 703 Ohm
Lead Channel Impedance Value: 893 Ohm
Lead Channel Pacing Threshold Amplitude: 1.5 V
Lead Channel Pacing Threshold Pulse Width: 0.4 ms
Lead Channel Pacing Threshold Pulse Width: 0.4 ms
Lead Channel Pacing Threshold Pulse Width: 0.5 ms
Lead Channel Sensing Intrinsic Amplitude: 2.75 mV
Lead Channel Sensing Intrinsic Amplitude: 2.75 mV
Lead Channel Setting Pacing Amplitude: 2 V
Lead Channel Setting Pacing Amplitude: 2.5 V
Lead Channel Setting Pacing Pulse Width: 0.4 ms
Lead Channel Setting Sensing Sensitivity: 0.3 mV
MDC IDC LEAD IMPLANT DT: 20181108
MDC IDC LEAD LOCATION: 753858
MDC IDC MSMT LEADCHNL LV IMPEDANCE VALUE: 1007 Ohm
MDC IDC MSMT LEADCHNL LV IMPEDANCE VALUE: 176 Ohm
MDC IDC MSMT LEADCHNL LV IMPEDANCE VALUE: 262.136
MDC IDC MSMT LEADCHNL LV IMPEDANCE VALUE: 418 Ohm
MDC IDC MSMT LEADCHNL LV IMPEDANCE VALUE: 608 Ohm
MDC IDC MSMT LEADCHNL RA PACING THRESHOLD AMPLITUDE: 1 V
MDC IDC MSMT LEADCHNL RV IMPEDANCE VALUE: 513 Ohm
MDC IDC MSMT LEADCHNL RV PACING THRESHOLD AMPLITUDE: 0.5 V
MDC IDC MSMT LEADCHNL RV SENSING INTR AMPL: 13.75 mV
MDC IDC MSMT LEADCHNL RV SENSING INTR AMPL: 14.625 mV
MDC IDC PG IMPLANT DT: 20181108
MDC IDC SESS DTM: 20190220090407
MDC IDC SET LEADCHNL LV PACING AMPLITUDE: 3 V
MDC IDC SET LEADCHNL LV PACING PULSEWIDTH: 0.5 ms
MDC IDC STAT BRADY AP VP PERCENT: 1.06 %
MDC IDC STAT BRADY AP VS PERCENT: 0.02 %
MDC IDC STAT BRADY RV PERCENT PACED: 46.82 %

## 2017-07-09 NOTE — Progress Notes (Signed)
Electrophysiology Office Note   Date:  07/09/2017   ID:  [name], DOB [birth year], MRN [id number]  PCP:  No primary care provider on file.  Cardiologist:  [name] Primary Electrophysiologist:  Billy Wurtz [name], MD    Chief Complaint  Patient presents with  . Cardiomyopathy     History of Present Illness: [name] is a 72 y.o. male who is being seen today for the evaluation of CHF at the request of Billy Space, MD. Presenting today for electrophysiology evaluation. He has a history of chronic systolic heart failure, coronary artery disease status post stenting of the RCA, left bundle branch block. He also has sleep apnea and uses a CPAP at night.  Medtronic CRT-D implanted 03/27/17.  Implant was comp gated by microperforation.  He was put on colchicine and that greatly improved his symptoms.  He has felt better since the CRT-D was implanted.  Today, denies symptoms of palpitations, chest pain, shortness of breath, orthopnea, PND, lower extremity edema, claudication, dizziness, presyncope, syncope, bleeding, or neurologic sequela. The patient is tolerating medications without difficulties.    Past Medical History:  Diagnosis Date  . Allergic rhinitis   . Anginal pain ([place]) 05/26/14   chest pain after chasing dog  . CAD (coronary artery disease)    hx of stent- 2005 RCA  . CHF (congestive heart failure) ([place])   . Chronic back pain    intermittent  . Constipation   . Dizziness   . Dysrhythmia    right bundle branch block   . Esophageal stricture   . GERD (gastroesophageal reflux disease)   . Hemoptysis   . Hiatal hernia   . History of colonic polyps    hyperplastic  . HTN (hypertension)   . Hyperlipidemia   . Hypertrophy of prostate with urinary obstruction and other lower urinary tract symptoms (LUTS)   . OA (osteoarthritis)   . OSA (obstructive sleep apnea)    cpap- 10   . Other specified disorder of stomach and duodenum    duodenal periampulary  tubulovillous adenoma removed by Dr. Ardis Green 5/10  . Pneumonia   . Shortness of breath    with exertion  . Testicular hypofunction    Past Surgical History:  Procedure Laterality Date  . BIV ICD INSERTION CRT-D N/A 03/27/2017   Procedure: BIV ICD INSERTION CRT-D;  Surgeon: Billy Haw, MD;  Location: [facility] [facility];  Service: Cardiovascular;  Laterality: N/A;  . CARDIAC CATHETERIZATION     '05, last 2009, showing patent RCA stent  . COLONOSCOPY  12/2007   HYPERPLASTIC POLYP  . COLONOSCOPY WITH PROPOFOL N/A 06/02/2014   Procedure: COLONOSCOPY WITH PROPOFOL;  Surgeon: Billy Banister, MD;  [facility];  Service: Endoscopy;  Laterality: N/A;  . CORONARY ANGIOPLASTY  08/2003  . ESOPHAGOGASTRODUODENOSCOPY (EGD) WITH PROPOFOL N/A 06/02/2014   Procedure: ESOPHAGOGASTRODUODENOSCOPY (EGD) WITH PROPOFOL;  Surgeon: Billy Banister, MD;  [facility];  Service: Endoscopy;  Laterality: N/A;  . hernia surgery x 3     Bilateral Inguinal, Umbicial  . IRRIGATION AND DEBRIDEMENT ABSCESS Left 08/14/2012   Procedure: IRRIGATION AND DEBRIDEMENT LEFT INGUINAL BOIL ;  Surgeon: Billy Rud, MD;  [facility];  Service: Urology;  Laterality: Left;  . JOINT REPLACEMENT Right 2017  . KNEE ARTHROPLASTY    . LEFT HEART CATHETERIZATION WITH CORONARY ANGIOGRAM N/A 05/26/2014   Procedure: LEFT HEART CATHETERIZATION WITH CORONARY ANGIOGRAM;  Surgeon: Billy Ohara, MD;  Location: [facility] CATH  LAB;  Service: Cardiovascular;  Laterality: N/A;  . LUMBAR LAMINECTOMY/DECOMPRESSION MICRODISCECTOMY Left 09/26/2014   Procedure: Lumbar Laminectomy for resection of synovial cyst  Lumbar five- sacral one left;  Surgeon: Billy Kos, MD;  Location: [place] [facility];  Service: Neurosurgery;  Laterality: Left;  . Oral surg to removed growth from ?sinus  10/2010   ?Dermoid removed by [name]  . PACEMAKER INSERTION  04/03/2017  . RCA stenting     '05 RCA  . right toe surgery  Right    Cyst   .  RIGHT/LEFT HEART CATH AND CORONARY ANGIOGRAPHY N/A 01/15/2017   Procedure: RIGHT/LEFT HEART CATH AND CORONARY ANGIOGRAPHY;  Surgeon: [name], MD;  Location: [facility];  Service: Cardiovascular;  Laterality: N/A;  . s/p right knee arthroscopy  2005  . TOTAL KNEE ARTHROPLASTY Right 03/12/2016   Procedure: RIGHT TOTAL KNEE ARTHROPLASTY;  Surgeon: Paralee Cancel, MD;  [facility];  Service: Orthopedics;  Laterality: Right;  . UPPER GASTROINTESTINAL ENDOSCOPY       Current Outpatient Medications  Medication Sig Dispense Refill  . aspirin EC 81 MG tablet Take 1 tablet (81 mg total) by mouth daily.    Marland Kitchen atorvastatin (LIPITOR) 20 MG tablet Take 1 tablet (20 mg total) by mouth daily at 6 PM. 90 tablet 3  . b complex vitamins tablet Take 1 tablet by mouth daily with lunch.     . calcium carbonate (OSCAL) 1500 (600 Ca) MG TABS tablet Take 600 mg by mouth every evening.    . carvedilol (COREG) 12.5 MG tablet Take 1 tablet (12.5 mg total) by mouth 2 (two) times daily with a meal. 180 tablet 3  . CINNAMON PO Take 1 tablet by mouth daily with lunch.     . Coenzyme Q10 (CO Q 10) 100 MG CAPS Take 1 capsule by mouth daily with lunch.     . Cream Base (PCCA LIPODERM BASE) CREA Apply 4 mLs topically daily.    . diclofenac sodium (VOLTAREN) 1 % GEL APPLY 2 GRAMS TO AFFECTED AREA 2 TO 4 TIMES DAILY AS NEEDED FOR PAIN.  3  . folic acid (FOLVITE) 588 MCG tablet Take 400 mcg by mouth daily.     . Glucosamine HCl (GLUCOSAMINE PO) Take 1,500 mg by mouth every evening.    . hydroxypropyl methylcellulose / hypromellose (ISOPTO TEARS / GONIOVISC) 2.5 % ophthalmic solution Place 1 drop into both eyes 4 (four) times daily as needed for dry eyes.     Marland Kitchen MAGNESIUM PO Take 1 tablet by mouth daily as needed (for cramping).     . Multiple Vitamin (MULTIVITAMIN) capsule Take 1 capsule by mouth daily.      . Multiple Vitamins-Minerals (OCUVITE PO) Take 1 tablet by mouth at bedtime.    . nabumetone (RELAFEN) 500  MG tablet Take 1 tablet (500 mg total) by mouth 2 (two) times daily. 180 tablet 0  . NON FORMULARY CPAP machine with sleep.    . Omega-3 Fatty Acids (FISH OIL) 500 MG CAPS Take 500 mg by mouth at bedtime.     . pantoprazole (PROTONIX) 40 MG tablet TAKE 1 TABLET BY MOUTH EVERY DAY BEFORE BREAKFAST 90 tablet 3  . Probiotic Product (PROBIOTIC DAILY PO) Take 1 capsule by mouth daily with lunch.     . sacubitril-valsartan (ENTRESTO) 49-51 MG Take 1 tablet by mouth 2 (two) times daily. 180 tablet 3  . Saw Palmetto, Serenoa repens, (SAW PALMETTO PO) Take 1 capsule by mouth every evening.    Marland Kitchen  spironolactone (ALDACTONE) 25 MG tablet Take 0.5 tablets (12.5 mg total) by mouth daily. 15 tablet 11  . tamsulosin (FLOMAX) 0.4 MG CAPS capsule Take 0.4 mg by mouth at bedtime.    . traMADol (ULTRAM) 50 MG tablet Take 50 mg by mouth daily with lunch.     . TURMERIC PO Take 1 tablet by mouth daily with lunch.      No current facility-administered medications for this visit.     Allergies:   Sulfamethoxazole-trimethoprim and Sulfonamide derivatives   Social History:  The patient  reports that he quit smoking about 27 years ago. he has never used smokeless tobacco. He reports that he drinks alcohol. He reports that he does not use drugs.   Family History:  The patient's family history includes Arthritis in his brother and daughter; COPD in his brother; Coronary artery disease in his father; Diabetes in his father; Heart disease in his father and mother; Heart failure in his brother; Hypertension in his unknown relative; Stomach cancer in his paternal grandmother; Sudden death in his father.   ROS:  Please see the history of present illness.   Otherwise, review of systems is positive for none.   All other systems are reviewed and negative.   PHYSICAL EXAM: VS:  BP 126/74   Pulse 86   Ht 5\' 8"  (1.727 m)   Wt 189 lb (85.7 kg)   BMI 28.74 kg/m  , BMI Body mass index is 28.74 kg/m. GEN: Well nourished, well  developed, in no acute distress  HEENT: normal  Neck: no JVD, carotid bruits, or masses Cardiac: RRR; no murmurs, rubs, or gallops,no edema  Respiratory:  clear to auscultation bilaterally, normal work of breathing GI: soft, nontender, nondistended, + BS MS: no deformity or atrophy  Skin: warm and dry, device site well healed Neuro:  Strength and sensation are intact Psych: euthymic mood, full affect  EKG:  EKG is not ordered today. Personal review of the ekg ordered 06/19/16 shows A sense, V pace  Personal review of the device interrogation today. Results in [place]: 10/23/2016: ALT 22; TSH 3.19 11/27/2016: Magnesium 2.1; NT-Pro BNP 276 03/20/2017: BUN 17; Creatinine, Ser 0.94; Hemoglobin 15.5; Platelets 256; Potassium 4.7; Sodium 139    Lipid Panel     Component Value Date/Time   CHOL 138 11/27/2016 0914   TRIG 188 (H) 11/27/2016 0914   HDL 35 (L) 11/27/2016 0914   CHOLHDL 3.9 11/27/2016 0914   CHOLHDL 4 12/07/2015 0742   VLDL 30.6 12/07/2015 0742   LDLCALC 65 11/27/2016 0914     Wt Readings from Last 3 Encounters:  07/09/17 189 lb (85.7 kg)  06/19/17 190 lb 6.4 oz (86.4 kg)  04/22/17 189 lb (85.7 kg)      Other studies Reviewed: Additional studies/ records that were reviewed today include: TTE 06/25/16 Review of the above records today demonstrates:  - Left ventricle: The cavity size was normal. Systolic function was   normal. The estimated ejection fraction was in the range of 55%   to 60%. Wall motion was normal; there were no regional wall   motion abnormalities. Doppler parameters are consistent with   abnormal left ventricular relaxation (grade 1 diastolic   dysfunction). - Ventricular septum: Septal motion showed dyssynergy. These   changes are consistent with a left bundle branch block. - Aortic valve: There was trivial regurgitation. - Atrial septum: There was increased thickness of the septum,   consistent with lipomatous  hypertrophy.  LHC/RHC 01/05/17  1. Widely patent coronary arteries with continued patency of the stented segment in the right coronary artery 2. Mild nonobstructive coronary artery disease as detailed with mild stenosis of the proximal to mid LAD and otherwise minimal luminal irregularities 3. Well compensated right-sided cardiac hemodynamics     ASSESSMENT AND PLAN:  1.  Chronic systolic heart failure: Currently on optimal medical therapy.  Medtronic CRT-D implanted 03/27/17.  Has had improvement in his ejection fraction to 55-60%.  No device changes at this time.  2. Coronary artery disease, native vessel, without angina: Currently no chest pain.  No changes.  3. Hyperlipidemia: continue statin.  4. Hypertension: Well-controlled.  No changes.    Current medicines are reviewed at length with the patient today.   The patient does not have concerns regarding his medicines.  The following changes were made today:  none  Labs/ tests ordered today include:  No orders of the defined types were placed in this encounter.    Disposition:   FU with Chee Dimon 9 months  Signed, Jerald Villalona [name], MD  07/09/2017 8:43 AM     [facility] [address] [place] [address] [phone] (office) [phone] (fax)

## 2017-07-09 NOTE — Patient Instructions (Addendum)
Medication Instructions:  Your physician recommends that you continue on your current medications as directed. Please refer to the Current Medication list given to you today.  *If you need a refill on your cardiac medications before your next appointment, please call your pharmacy*  Labwork: None ordered  Testing/Procedures: None ordered  Follow-Up: Remote monitoring is used to monitor your Pacemaker or ICD from home. This monitoring reduces the number of office visits required to check your device to one time per year. It allows [place] to keep an eye on the functioning of your device to ensure it is working properly. You are scheduled for a device check from home on 10/08/2017. You may send your transmission at any time that day. If you have a wireless device, the transmission will be sent automatically. After your physician reviews your transmission, you will receive a postcard with your next transmission date.  Your physician wants you to follow-up in: 1 year with Dr. Curt Bears.  You will receive a reminder letter in the mail two months in advance. If you don't receive a letter, please call our office to schedule the follow-up appointment.  Thank you for choosing [facility]!!   Trinidad Curet, RN [phone]

## 2017-07-15 DIAGNOSIS — L821 Other seborrheic keratosis: Secondary | ICD-10-CM | POA: Diagnosis not present

## 2017-07-15 DIAGNOSIS — L570 Actinic keratosis: Secondary | ICD-10-CM | POA: Diagnosis not present

## 2017-07-15 DIAGNOSIS — D225 Melanocytic nevi of trunk: Secondary | ICD-10-CM | POA: Diagnosis not present

## 2017-07-15 DIAGNOSIS — D485 Neoplasm of uncertain behavior of skin: Secondary | ICD-10-CM | POA: Diagnosis not present

## 2017-07-31 ENCOUNTER — Ambulatory Visit (AMBULATORY_SURGERY_CENTER): Payer: Self-pay | Admitting: *Deleted

## 2017-07-31 ENCOUNTER — Other Ambulatory Visit: Payer: Self-pay

## 2017-07-31 VITALS — Ht 68.0 in | Wt 188.0 lb

## 2017-07-31 DIAGNOSIS — D132 Benign neoplasm of duodenum: Secondary | ICD-10-CM

## 2017-07-31 NOTE — Progress Notes (Signed)
No egg or soy allergy known to patient  No issues with past sedation with any surgeries  or procedures, no intubation problems  No diet pills per patient No home 02 use per patient  No blood thinners per patient  Pt denies issues with constipation  No A fib or A flutter  EMMI video sent to pt's e mail  Pt. declined 

## 2017-08-08 ENCOUNTER — Encounter: Payer: Self-pay | Admitting: Gastroenterology

## 2017-08-08 ENCOUNTER — Ambulatory Visit (AMBULATORY_SURGERY_CENTER): Payer: Medicare Other | Admitting: Gastroenterology

## 2017-08-08 VITALS — BP 128/74 | HR 93 | Temp 98.2°F | Resp 14 | Ht 68.0 in | Wt 188.0 lb

## 2017-08-08 DIAGNOSIS — G4733 Obstructive sleep apnea (adult) (pediatric): Secondary | ICD-10-CM | POA: Diagnosis not present

## 2017-08-08 DIAGNOSIS — E119 Type 2 diabetes mellitus without complications: Secondary | ICD-10-CM | POA: Diagnosis not present

## 2017-08-08 DIAGNOSIS — I2510 Atherosclerotic heart disease of native coronary artery without angina pectoris: Secondary | ICD-10-CM | POA: Diagnosis not present

## 2017-08-08 DIAGNOSIS — D132 Benign neoplasm of duodenum: Secondary | ICD-10-CM | POA: Diagnosis not present

## 2017-08-08 DIAGNOSIS — K219 Gastro-esophageal reflux disease without esophagitis: Secondary | ICD-10-CM | POA: Diagnosis not present

## 2017-08-08 DIAGNOSIS — Z86018 Personal history of other benign neoplasm: Secondary | ICD-10-CM

## 2017-08-08 DIAGNOSIS — I509 Heart failure, unspecified: Secondary | ICD-10-CM | POA: Diagnosis not present

## 2017-08-08 DIAGNOSIS — I10 Essential (primary) hypertension: Secondary | ICD-10-CM | POA: Diagnosis not present

## 2017-08-08 DIAGNOSIS — E669 Obesity, unspecified: Secondary | ICD-10-CM | POA: Diagnosis not present

## 2017-08-08 MED ORDER — SODIUM CHLORIDE 0.9 % IV SOLN: 500.0000 mL | Freq: Once | INTRAVENOUS | Status: DC

## 2017-08-08 NOTE — Progress Notes (Signed)
Pt's states no medical or surgical changes since previsit or office visit. 

## 2017-08-08 NOTE — Progress Notes (Signed)
Called to room to assist during endoscopic procedure.  Patient ID and intended procedure confirmed with present staff. Received instructions for my participation in the procedure from the performing physician.  

## 2017-08-08 NOTE — Op Note (Signed)
[facility] Patient Name: [name] Procedure Date: 08/08/2017 9:34 AM MRN: [id number] Endoscopist: [name] , MD Age: 72 Referring MD:  Date of Birth: 10-07-1945 Gender: Male Account #: [id number] Procedure:                Upper GI endoscopy Indications:              incidentally noted duodenal polyp partially                            resected endoscopically TVA (09/2008) (Dr. Henrene Pastor),                            repeat procedure December 2012 found small residual                            tubular adenoma at the site, appeared to be                            completely removed endoscopically, 2012 repeat EGD                            with small residual adenoma at the site removed.                            repeat EGD 2016 with small adenoma at the site                            removed. Medicines:                Monitored Anesthesia Care Procedure:                Pre-Anesthesia Assessment:                           - Prior to the procedure, a History and Physical                            was performed, and patient medications and                            allergies were reviewed. The patient's tolerance of                            previous anesthesia was also reviewed. The risks                            and benefits of the procedure and the sedation                            options and risks were discussed with the patient.                            All questions were answered, and informed consent  was obtained. Prior Anticoagulants: The patient has                            taken no previous anticoagulant or antiplatelet                            agents. ASA Grade Assessment: II - A patient with                            mild systemic disease. After reviewing the risks                            and benefits, the patient was deemed in                            satisfactory condition to undergo the procedure.                    After obtaining informed consent, the endoscope was                            passed under direct vision. Throughout the                            procedure, the patient's blood pressure, pulse, and                            oxygen saturations were monitored continuously. The                            Endoscope was introduced through the mouth, and                            advanced to the second part of duodenum. The upper                            GI endoscopy was accomplished without difficulty.                            The patient tolerated the procedure well. Scope In: Scope Out: Findings:                 There were several small soft fleshy polyps in the                            proximal stomach.                           The major papilla was normal. About 1cm from the                            major papilla was a small amount (1-61mm) of                            recurrent adenomatous appearing mucosa. This  was                            removed with several biopsy bites (jar 1).                           The previously documented small distal stomach                            submucosal lesion is again unchanged.                           The exam was otherwise without abnormality. Complications:            No immediate complications. Estimated blood loss:                            None. Estimated Blood Loss:     Estimated blood loss: none. Impression:               - Small amount of recurrent polypoid growth about                            1cm from the major papilla. This was removed with                            biopsy forceps (jar 1). Recommendation:           - Patient has a contact number available for                            emergencies. The signs and symptoms of potential                            delayed complications were discussed with the                            patient. Return to normal activities tomorrow.                             Written discharge instructions were provided to the                            patient.                           - Resume previous diet.                           - Continue present medications.                           - Await pathology results for surveillance                            recommendations. [name], MD 08/08/2017 10:04:26 AM This report has been signed electronically.

## 2017-08-08 NOTE — Patient Instructions (Signed)
YOU HAD AN ENDOSCOPIC PROCEDURE TODAY AT THE [facility]:   Refer to the procedure report that was given to you for any specific questions about what was found during the examination.  If the procedure report does not answer your questions, please call your gastroenterologist to clarify.  If you requested that your care partner not be given the details of your procedure findings, then the procedure report has been included in a sealed envelope for you to review at your convenience later.  YOU SHOULD EXPECT: Some feelings of bloating in the abdomen. Passage of more gas than usual.  Walking can help get rid of the air that was put into your GI tract during the procedure and reduce the bloating.   Please Note:  You might notice some irritation and congestion in your nose or some drainage.  This is from the oxygen used during your procedure.  There is no need for concern and it should clear up in a day or so.  SYMPTOMS TO REPORT IMMEDIATELY:   Following upper endoscopy (EGD)  Vomiting of blood or coffee ground material  New chest pain or pain under the shoulder blades  Painful or persistently difficult swallowing  New shortness of breath  Fever of 100F or higher  Black, tarry-looking stools  For urgent or emergent issues, a gastroenterologist can be reached at any hour by calling [phone].   DIET:  We do recommend a small meal at first, but then you may proceed to your regular diet.  Drink plenty of fluids but you should avoid alcoholic beverages for 24 hours.  ACTIVITY:  You should plan to take it easy for the rest of today and you should NOT DRIVE or use heavy machinery until tomorrow (because of the sedation medicines used during the test).    FOLLOW UP: Our staff will call the number listed on your records the next business day following your procedure to check on you and address any questions or concerns that you may have regarding the information given to you following  your procedure. If we do not reach you, we will leave a message.  However, if you are feeling well and you are not experiencing any problems, there is no need to return our call.  We will assume that you have returned to your regular daily activities without incident.  If any biopsies were taken you will be contacted by phone or by letter within the next 1-3 weeks.  Please call us at [phone] if you have not heard about the biopsies in 3 weeks.    SIGNATURES/CONFIDENTIALITY: You and/or your care partner have signed paperwork which will be entered into your electronic medical record.  These signatures attest to the fact that that the information above on your After Visit Summary has been reviewed and is understood.  Full responsibility of the confidentiality of this discharge information lies with you and/or your care-partner. 

## 2017-08-08 NOTE — Progress Notes (Signed)
Report to PACU, RN, vss, BBS= Clear.  

## 2017-08-11 ENCOUNTER — Telehealth: Payer: Self-pay | Admitting: *Deleted

## 2017-08-11 ENCOUNTER — Telehealth: Payer: Self-pay

## 2017-08-11 NOTE — Telephone Encounter (Signed)
No answer for post procedure call back. Will attempt to call back later this afternoon. SM 

## 2017-08-11 NOTE — Telephone Encounter (Signed)
  Follow up Call-  Call back number 08/08/2017  Post procedure Call Back phone  # [phone]  Permission to leave phone message Yes  Some recent data might be hidden     Patient questions:  Do you have a fever, pain , or abdominal swelling? No. Pain Score  0 *  Have you tolerated food without any problems? Yes.    Have you been able to return to your normal activities? Yes.    Do you have any questions about your discharge instructions: Diet   No. Medications  No. Follow up visit  No.  Do you have questions or concerns about your Care? No.  Actions: * If pain score is 4 or above: No action needed, pain <4.

## 2017-08-12 ENCOUNTER — Ambulatory Visit (INDEPENDENT_AMBULATORY_CARE_PROVIDER_SITE_OTHER): Payer: Medicare Other | Admitting: Physician Assistant

## 2017-08-12 DIAGNOSIS — M1712 Unilateral primary osteoarthritis, left knee: Secondary | ICD-10-CM

## 2017-08-12 MED ORDER — SODIUM HYALURONATE (VISCOSUP) 20 MG/2ML IX SOSY
20.0000 mg | PREFILLED_SYRINGE | INTRA_ARTICULAR | Status: AC | PRN
  Administered 2017-08-12: 20 mg via INTRA_ARTICULAR

## 2017-08-12 MED ORDER — LIDOCAINE HCL 1 % IJ SOLN
1.5000 mL | INTRAMUSCULAR | Status: AC | PRN
  Administered 2017-08-12: 1.5 mL

## 2017-08-12 NOTE — Progress Notes (Signed)
   Procedure Note  Patient: [name]             Date of Birth: [birth year]           MRN: [id number]             Visit Date: 08/12/2017  Procedures: Visit Diagnoses: Primary osteoarthritis of left knee Euflexxa #1 left knee B/B Large Joint Inj: L knee on 08/12/2017 1:42 PM Indications: pain Details: 27 G 1.5 in needle, medial approach  Arthrogram: No  Medications: 20 mg Sodium Hyaluronate 20 MG/2ML; 1.5 mL lidocaine 1 % Aspirate: 0 mL Outcome: tolerated well, no immediate complications Procedure, treatment alternatives, risks and benefits explained, specific risks discussed. Consent was given by the patient. Immediately prior to procedure a time out was called to verify the correct patient, procedure, equipment, support staff and site/side marked as required. Patient was prepped and draped in the usual sterile fashion.     Patient tolerated the procedure well.    Hazel Sams, PA-C

## 2017-08-13 DIAGNOSIS — E291 Testicular hypofunction: Secondary | ICD-10-CM | POA: Diagnosis not present

## 2017-08-13 DIAGNOSIS — N401 Enlarged prostate with lower urinary tract symptoms: Secondary | ICD-10-CM | POA: Diagnosis not present

## 2017-08-15 ENCOUNTER — Encounter: Payer: Self-pay | Admitting: Gastroenterology

## 2017-08-19 ENCOUNTER — Encounter: Payer: Self-pay | Admitting: Physician Assistant

## 2017-08-19 ENCOUNTER — Ambulatory Visit (INDEPENDENT_AMBULATORY_CARE_PROVIDER_SITE_OTHER): Payer: Medicare Other | Admitting: Physician Assistant

## 2017-08-19 VITALS — BP 133/75 | HR 68 | Resp 16 | Ht 68.0 in | Wt 185.0 lb

## 2017-08-19 DIAGNOSIS — M1712 Unilateral primary osteoarthritis, left knee: Secondary | ICD-10-CM | POA: Diagnosis not present

## 2017-08-19 MED ORDER — SODIUM HYALURONATE (VISCOSUP) 20 MG/2ML IX SOSY
20.0000 mg | PREFILLED_SYRINGE | INTRA_ARTICULAR | Status: AC | PRN
  Administered 2017-08-19: 20 mg via INTRA_ARTICULAR

## 2017-08-19 MED ORDER — LIDOCAINE HCL 1 % IJ SOLN
1.5000 mL | INTRAMUSCULAR | Status: AC | PRN
Start: 2017-08-19 — End: 2017-08-19
  Administered 2017-08-19: 1.5 mL

## 2017-08-19 NOTE — Progress Notes (Signed)
   Procedure Note  Patient: LUX MEADERS             Date of Birth: July 08, 1945           MRN: [id number]             Visit Date: 08/19/2017  Procedures: Visit Diagnoses: Primary osteoarthritis of left knee Euflexxa #2 left knee B/B Large Joint Inj: L knee on 08/19/2017 12:05 PM Indications: pain Details: 27 G 1.5 in needle, medial approach  Arthrogram: No  Medications: 20 mg Sodium Hyaluronate 20 MG/2ML; 1.5 mL lidocaine 1 % Aspirate: 0 mL Outcome: tolerated well, no immediate complications Procedure, treatment alternatives, risks and benefits explained, specific risks discussed. Consent was given by the patient. Immediately prior to procedure a time out was called to verify the correct patient, procedure, equipment, support staff and site/side marked as required. Patient was prepped and draped in the usual sterile fashion.     Patient tolerated the procedure well.    Hazel Sams, PA-C

## 2017-08-26 ENCOUNTER — Ambulatory Visit (INDEPENDENT_AMBULATORY_CARE_PROVIDER_SITE_OTHER): Payer: Medicare Other | Admitting: Physician Assistant

## 2017-08-26 ENCOUNTER — Telehealth: Payer: Self-pay | Admitting: Gastroenterology

## 2017-08-26 ENCOUNTER — Telehealth: Payer: Self-pay | Admitting: Pulmonary Disease

## 2017-08-26 DIAGNOSIS — M1712 Unilateral primary osteoarthritis, left knee: Secondary | ICD-10-CM

## 2017-08-26 MED ORDER — LIDOCAINE HCL 1 % IJ SOLN
1.5000 mL | INTRAMUSCULAR | Status: AC | PRN
Start: 2017-08-26 — End: 2017-08-26
  Administered 2017-08-26: 1.5 mL

## 2017-08-26 MED ORDER — SODIUM HYALURONATE (VISCOSUP) 20 MG/2ML IX SOSY
20.0000 mg | PREFILLED_SYRINGE | INTRA_ARTICULAR | Status: AC | PRN
  Administered 2017-08-26: 20 mg via INTRA_ARTICULAR

## 2017-08-26 NOTE — Progress Notes (Signed)
   Procedure Note  Patient: [name]             Date of Birth: 1945-06-29           MRN: [id number]             Visit Date: 08/26/2017  Procedures: Visit Diagnoses: Primary osteoarthritis of left knee Euflexxa #3 left knee B/B Large Joint Inj: L knee on 08/26/2017 1:37 PM Indications: pain Details: 27 G 1.5 in needle, medial approach  Arthrogram: No  Medications: 20 mg Sodium Hyaluronate 20 MG/2ML; 1.5 mL lidocaine 1 % Aspirate: 0 mL Outcome: tolerated well, no immediate complications Procedure, treatment alternatives, risks and benefits explained, specific risks discussed. Consent was given by the patient. Immediately prior to procedure a time out was called to verify the correct patient, procedure, equipment, support staff and site/side marked as required. Patient was prepped and draped in the usual sterile fashion.     Patient tolerated procedure well.    Hazel Sams, PA-C

## 2017-08-26 NOTE — Telephone Encounter (Signed)
Called and spoke to patient. Patient stated that he is going [place] weekend to [place] for 3 weeks. Reported he will be going to [place], [place], and [place]. Patient stated that in the past he has been given an antibiotic in case he gets sick while traveling and would like Dr. Lenna Gilford to give him a Z pack for his trip.  SN please advise.   Allergies  Allergen Reactions  . Sulfamethoxazole-Trimethoprim Rash       . Sulfonamide Derivatives Rash        Current Outpatient Medications on File Prior to Visit  Medication Sig Dispense Refill  . aspirin EC 81 MG tablet Take 1 tablet (81 mg total) by mouth daily.    Marland Kitchen atorvastatin (LIPITOR) 20 MG tablet Take 20 mg by mouth daily.    Marland Kitchen b complex vitamins tablet Take 1 tablet by mouth daily with lunch.     . calcium carbonate (OSCAL) 1500 (600 Ca) MG TABS tablet Take 600 mg by mouth every evening.    . carvedilol (COREG) 12.5 MG tablet Take 1 tablet (12.5 mg total) by mouth 2 (two) times daily with a meal. 180 tablet 3  . CINNAMON PO Take 1 tablet by mouth daily with lunch.     . Coenzyme Q10 (CO Q 10) 100 MG CAPS Take 1 capsule by mouth daily with lunch.     . Cream Base (PCCA LIPODERM BASE) CREA Apply 4 mLs topically daily.    . diclofenac sodium (VOLTAREN) 1 % GEL APPLY 2 GRAMS TO AFFECTED AREA 2 TO 4 TIMES DAILY AS NEEDED FOR PAIN.  3  . folic acid (FOLVITE) 213 MCG tablet Take 400 mcg by mouth daily.     . Glucosamine HCl (GLUCOSAMINE PO) Take 1,500 mg by mouth every evening.    . hydroxypropyl methylcellulose / hypromellose (ISOPTO TEARS / GONIOVISC) 2.5 % ophthalmic solution Place 1 drop into both eyes 4 (four) times daily as needed for dry eyes.     Marland Kitchen MAGNESIUM PO Take 1 tablet by mouth daily as needed (for cramping).     . Multiple Vitamin (MULTIVITAMIN) capsule Take 1 capsule by mouth daily.      . Multiple Vitamins-Minerals (OCUVITE PO) Take 1 tablet by mouth at bedtime.    . nabumetone (RELAFEN) 500 MG tablet Take 1 tablet (500 mg total) by  mouth 2 (two) times daily. 180 tablet 0  . NON FORMULARY CPAP machine with sleep.    . Omega-3 Fatty Acids (FISH OIL) 500 MG CAPS Take 500 mg by mouth at bedtime.     . pantoprazole (PROTONIX) 40 MG tablet TAKE 1 TABLET BY MOUTH EVERY DAY BEFORE BREAKFAST 90 tablet 3  . Probiotic Product (PROBIOTIC DAILY PO) Take 1 capsule by mouth daily with lunch.     . sacubitril-valsartan (ENTRESTO) 49-51 MG Take 1 tablet by mouth 2 (two) times daily. 180 tablet 3  . Saw Palmetto, Serenoa repens, (SAW PALMETTO PO) Take 1 capsule by mouth every evening.    Marland Kitchen spironolactone (ALDACTONE) 25 MG tablet Take 0.5 tablets (12.5 mg total) by mouth daily. 15 tablet 11  . tamsulosin (FLOMAX) 0.4 MG CAPS capsule Take 0.4 mg by mouth at bedtime.    . traMADol (ULTRAM) 50 MG tablet Take 50 mg by mouth daily with lunch.     . TURMERIC PO Take 1 tablet by mouth daily with lunch.      Current Facility-Administered Medications on File Prior to Visit  Medication Dose Route Frequency Provider Last  Rate Last Dose  . 0.9 %  sodium chloride infusion  500 mL Intravenous Once [name], MD

## 2017-08-26 NOTE — Telephone Encounter (Signed)
ATC pt, no answer. Left message for pt to call back.  

## 2017-08-26 NOTE — Telephone Encounter (Signed)
Pt is calling back [phone]

## 2017-08-26 NOTE — Telephone Encounter (Signed)
August 15, 2017   [place] [address]   Dear 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 I recommend a repeat upper endoscopy in 3 years since this polyps seems to keep coming back.     If you have any questions or concerns, please don't hesitate to call.  Sincerely,    [name], MD   The pt has been advised of the path results and notified that a letter was mailed to the home

## 2017-08-27 ENCOUNTER — Other Ambulatory Visit: Payer: Self-pay | Admitting: Pulmonary Disease

## 2017-08-27 DIAGNOSIS — H35033 Hypertensive retinopathy, bilateral: Secondary | ICD-10-CM | POA: Diagnosis not present

## 2017-08-27 MED ORDER — AZITHROMYCIN 250 MG PO TABS: ORAL_TABLET | ORAL | 0 refills | Status: DC

## 2017-08-27 NOTE — Telephone Encounter (Signed)
Per SN- Ok for z pack prescription. Called Mr Upchurch, and he requested it sent to [facility], [place].  Prescription sent. Nothing further needed at this time.

## 2017-09-01 ENCOUNTER — Other Ambulatory Visit: Payer: Self-pay | Admitting: Rheumatology

## 2017-09-01 MED ORDER — NABUMETONE 500 MG PO TABS: 500.0000 mg | ORAL_TABLET | Freq: Two times a day (BID) | ORAL | 0 refills | Status: DC

## 2017-09-01 NOTE — Telephone Encounter (Signed)
Last Visit: 04/22/17 Next Visit: 10/22/17 Labs: 03/20/17 WNL  Okay to refill per Dr. Estanislado Pandy

## 2017-09-01 NOTE — Telephone Encounter (Signed)
Patient came by the office today and requested an RX refill on her Nabumetone.  It is a 90-day supply and he uses [facility].  CB#.  [phone].  Thank you.

## 2017-10-08 ENCOUNTER — Ambulatory Visit (INDEPENDENT_AMBULATORY_CARE_PROVIDER_SITE_OTHER): Payer: Medicare Other | Admitting: *Deleted

## 2017-10-08 DIAGNOSIS — I428 Other cardiomyopathies: Secondary | ICD-10-CM

## 2017-10-08 DIAGNOSIS — I5022 Chronic systolic (congestive) heart failure: Secondary | ICD-10-CM

## 2017-10-08 NOTE — Progress Notes (Signed)
Remote ICD transmission.   

## 2017-10-09 NOTE — Progress Notes (Deleted)
Office Visit Note  Patient: [name]             Date of Birth: 20-Jul-1945           MRN: [id number]             PCP: [name], MD Referring: [name], MD Visit Date: 10/22/2017 Occupation: @GUAROCC @    Subjective:  No chief complaint on file.   History of Present Illness: [name] is a 72 y.o. male ***   Activities of Daily Living:  Patient reports morning stiffness for *** {minute/hour:19697}.   Patient {ACTIONS;DENIES/REPORTS:21021675::"Denies"} nocturnal pain.  Difficulty dressing/grooming: {ACTIONS;DENIES/REPORTS:21021675::"Denies"} Difficulty climbing stairs: {ACTIONS;DENIES/REPORTS:21021675::"Denies"} Difficulty getting out of chair: {ACTIONS;DENIES/REPORTS:21021675::"Denies"} Difficulty using hands for taps, buttons, cutlery, and/or writing: {ACTIONS;DENIES/REPORTS:21021675::"Denies"}   No Rheumatology ROS completed.   PMFS History:  Patient Active Problem List   Diagnosis Date Noted  . CHF (congestive heart failure) ([place]) 03/27/2017  . DCM (dilated cardiomyopathy) ([place]) 02/11/2017  . Chronic systolic heart failure ([place]) 01/15/2017  . History of pneumonia 10/23/2016  . Osteoarthritis 10/23/2016  . Primary osteoarthritis of both feet 10/04/2016  . Primary osteoarthritis of left knee 10/04/2016  . DDD (degenerative disc disease), lumbar 10/04/2016  . Hemoptysis 05/27/2016  . Overweight (BMI 25.0-29.9) 03/14/2016  . 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  . Dyspnea 11/29/2014  . Synovial cyst of lumbar facet joint 09/26/2014  . Incisional hernia, without obstruction or gangrene 12/06/2013  . Nausea alone 08/27/2013  . Colon cancer screening 08/27/2013  . 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 Benign prostatic hyperplasia with urinary obstruction 06/18/2010  . OTHER SPECIFIED DISORDER OF STOMACH AND DUODENUM 12/16/2008  . BENIGN NEOPLASM OF LIVER AND BILIARY PASSAGES 11/15/2008  . GERD 09/02/2008  . COLONIC POLYPS, HYPERPLASTIC 05/24/2007  .  Obstructive sleep apnea 05/24/2007  . ALLERGIC RHINITIS 05/24/2007  . ESOPHAGEAL STRICTURE 05/24/2007  . HIATAL HERNIA 05/24/2007  . Hyperlipidemia 02/06/2007  . Essential hypertension 02/06/2007  . CAD (coronary artery disease) 02/06/2007  . Primary osteoarthritis of both hands 02/06/2007  . Backache 02/06/2007    Past Medical History:  Diagnosis Date  . Allergic rhinitis   . Allergy   . Anginal pain ([place]) 05/26/14   chest pain after chasing dog  . CAD (coronary artery disease)    hx of stent- 2005 RCA  . Cataract    beginning stage both eyes  . CHF (congestive heart failure) ([facility])    pacemaker Medtronic    . Chronic back pain    intermittent  . Constipation   . Dizziness   . Dysrhythmia    right bundle branch block   . Esophageal stricture   . GERD (gastroesophageal reflux disease)   . Hemoptysis   . Hiatal hernia   . History of colonic polyps    hyperplastic  . HTN (hypertension)   . Hyperlipidemia   . Hypertrophy of prostate with urinary obstruction and other lower urinary tract symptoms (LUTS)   . OA (osteoarthritis)   . OSA (obstructive sleep apnea)    cpap- 10   . 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 Pneumonia   . Shortness of breath    with exertion  . Sleep apnea    cpap  . Testicular hypofunction     Family History  Problem Relation Age of Onset  . Coronary artery disease Father   . Diabetes Father   . Sudden death Father  due to heart disease  . Heart disease Father   . Heart disease Mother   . Hypertension Unknown   . Stomach cancer Paternal Grandmother   . COPD Brother   . Arthritis Brother   . Heart failure Brother   . Arthritis Daughter   . Colon cancer Neg Hx   . Esophageal cancer Neg Hx   . Colon polyps Neg Hx   . Ulcerative colitis Neg Hx    Past Surgical History:  Procedure Laterality Date  . BIV ICD INSERTION CRT-D N/A 03/27/2017    Procedure: BIV ICD INSERTION CRT-D;  Surgeon: Constance Haw, MD;  Location: [facility];  Service: Cardiovascular;  Laterality: N/A;  . CARDIAC CATHETERIZATION     '05, last 2009, showing patent RCA stent  . COLONOSCOPY  12/2007   HYPERPLASTIC POLYP  . COLONOSCOPY WITH PROPOFOL N/A 06/02/2014   Procedure: COLONOSCOPY WITH PROPOFOL;  Surgeon: [name], MD;  [facility]: [facility];  Service: Endoscopy;  Laterality: N/A;  . CORONARY ANGIOPLASTY  08/2003  . ESOPHAGOGASTRODUODENOSCOPY (EGD) WITH PROPOFOL N/A 06/02/2014   Procedure: ESOPHAGOGASTRODUODENOSCOPY (EGD) WITH PROPOFOL;  Surgeon: [name], MD;  [facility]: [facility];  Service: Endoscopy;  Laterality: N/A;  . hernia surgery x 3     Bilateral Inguinal, Umbicial  . IRRIGATION AND DEBRIDEMENT ABSCESS Left 08/14/2012   Procedure: IRRIGATION AND DEBRIDEMENT LEFT INGUINAL BOIL ;  Surgeon: Ailene Rud, MD;  Location: [facility];  Service: Urology;  Laterality: Left;  . JOINT REPLACEMENT Right 2017  . KNEE ARTHROPLASTY    . LEFT HEART CATHETERIZATION WITH CORONARY ANGIOGRAM N/A 05/26/2014   Procedure: LEFT HEART CATHETERIZATION WITH CORONARY ANGIOGRAM;  Surgeon: Blane Ohara, MD;  Location: [facility];  Service: Cardiovascular;  Laterality: N/A;  . LUMBAR LAMINECTOMY/DECOMPRESSION MICRODISCECTOMY Left 09/26/2014   Procedure: Lumbar Laminectomy for resection of synovial cyst  Lumbar five- sacral one left;  Surgeon: Kary Kos, MD;  Location: [facility];  Service: Neurosurgery;  Laterality: Left;  . Oral surg to removed growth from ?sinus  10/2010   ?Dermoid removed by DrRiggs  . PACEMAKER INSERTION  04/03/2017  . POLYPECTOMY    . RCA stenting     '05 RCA  . right toe surgery  Right    Cyst   . RIGHT/LEFT HEART CATH AND CORONARY ANGIOGRAPHY N/A 01/15/2017   Procedure: RIGHT/LEFT HEART CATH AND CORONARY ANGIOGRAPHY;  Surgeon: [name], MD;  Location: [facility];  Service: Cardiovascular;  Laterality:  N/A;  . s/p right knee arthroscopy  2005  . TOTAL KNEE ARTHROPLASTY Right 03/12/2016   Procedure: RIGHT TOTAL KNEE ARTHROPLASTY;  Surgeon: Paralee Cancel, MD;  Location: [facility];  Service: Orthopedics;  Laterality: Right;  . UPPER GASTROINTESTINAL ENDOSCOPY     Social History   Social History Narrative  . Not on file     Objective: Vital Signs: There were no vitals taken for this visit.   Physical Exam   Musculoskeletal Exam: ***  CDAI Exam: No CDAI exam completed.    Investigation: No additional findings.   Imaging: No results found.  Speciality Comments: No specialty comments available.    Procedures:  No procedures performed Allergies: Sulfamethoxazole-trimethoprim and Sulfonamide derivatives   Assessment / Plan:     Visit Diagnoses: No diagnosis found.    Orders: No orders of the defined types were placed in this encounter.  No orders of the defined types were placed in this encounter.   Face-to-face time spent with  patient was *** minutes. 50% of time was spent in counseling and coordination of care.  Follow-Up Instructions: No follow-ups on file.   Earnestine Mealing, CMA  Note - This record has been created using Editor, commissioning.  Chart creation errors have been sought, but may not always  have been located. Such creation errors do not reflect on  the standard of medical care.

## 2017-10-14 DIAGNOSIS — M7130 Other bursal cyst, unspecified site: Secondary | ICD-10-CM | POA: Diagnosis not present

## 2017-10-14 DIAGNOSIS — M5137 Other intervertebral disc degeneration, lumbosacral region: Secondary | ICD-10-CM | POA: Diagnosis not present

## 2017-10-14 DIAGNOSIS — Z6829 Body mass index (BMI) 29.0-29.9, adult: Secondary | ICD-10-CM | POA: Diagnosis not present

## 2017-10-15 ENCOUNTER — Other Ambulatory Visit: Payer: Self-pay | Admitting: Neurosurgery

## 2017-10-15 DIAGNOSIS — M7130 Other bursal cyst, unspecified site: Secondary | ICD-10-CM

## 2017-10-15 NOTE — Progress Notes (Signed)
Office Visit Note  Patient: [name]             Date of Birth: [birth year]           MRN: [id number]             PCP: [name], MD Referring: [name], MD Visit Date: 10/16/2017 Occupation: @GUAROCC @    Subjective:  Pain in bilateral wrists   History of Present Illness: [name] is a 72 y.o. male with history of osteoarthritis and DDD.  Patient reports he continues to have pain in bilateral knee joints.  He reports the Euflexxa injections in his left knee helped temporarily.  He reports that he went on a trip to [place] for 18 days and was walking daily which caused his bilateral knees to become uncomfortable.  He denies any knee joint swelling.  He states he is also having discomfort in bilateral wrist especially his right wrist.  He states he is also noticed some swelling in his right wrist.  He states that he also has discomfort in bilateral ankles especially first thing in the morning.  Denies any pain in his feet at this time.  He states that he continues to have lower back pain and saw Dr. Saintclair Halsted the neurosurgeon recently who recommended getting an MRI as well as injections in his lower back.  He has a pacemaker so MRI has been slightly delayed.  He denies any other joint pain or joint swelling at this time.  He takes tramadol 80 mg for pain relief.  He also takes tumeric and cinnamon. Both knee pain, no swelling, walking a lot on vacation    Activities of Daily Living:  Patient reports morning stiffness for 1  hours.   Patient Reports nocturnal pain.  Difficulty dressing/grooming: Denies Difficulty climbing stairs: Reports Difficulty getting out of chair: Reports Difficulty using hands for taps, buttons, cutlery, and/or writing: Reports   Review of Systems  Constitutional: Negative for fatigue, fever and night sweats.  HENT: Negative for ear pain, mouth sores, mouth dryness and nose dryness.   Eyes: Negative for pain, redness and dryness.  Respiratory:  Negative for cough, shortness of breath and difficulty breathing.   Cardiovascular: Positive for swelling in legs/feet. Negative for chest pain, palpitations, hypertension and irregular heartbeat.  Gastrointestinal: Negative for blood in stool, constipation and diarrhea.  Endocrine: Negative for increased urination.  Genitourinary: Negative for difficulty urinating and painful urination.  Musculoskeletal: Positive for arthralgias, joint pain and joint swelling. Negative for myalgias, muscle weakness, morning stiffness, muscle tenderness and myalgias.  Skin: Negative for color change, rash, hair loss, nodules/bumps, skin tightness, ulcers and sensitivity to sunlight.  Allergic/Immunologic: Negative for susceptible to infections.  Neurological: Negative for dizziness, fainting, numbness, memory loss, night sweats and weakness.  Hematological: Negative for swollen glands.  Psychiatric/Behavioral: Negative for depressed mood and sleep disturbance. The patient is not nervous/anxious.     PMFS History:  Patient Active Problem List   Diagnosis Date Noted  . CHF (congestive heart failure) ([place]) 03/27/2017  . DCM (dilated cardiomyopathy) ([place]) 02/11/2017  . Chronic systolic heart failure ([place]) 01/15/2017  . History of pneumonia 10/23/2016  . Osteoarthritis 10/23/2016  . Primary osteoarthritis of both feet 10/04/2016  . Primary osteoarthritis of left knee 10/04/2016  . DDD (degenerative disc disease), lumbar 10/04/2016  . Hemoptysis 05/27/2016  . Overweight (BMI 25.0-29.9) 03/14/2016  . 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  . Dyspnea 11/29/2014  . Synovial cyst of lumbar facet  joint 09/26/2014  . Incisional hernia, without obstruction or gangrene 12/06/2013  . Nausea alone 08/27/2013  . Colon cancer screening 08/27/2013  . 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 Benign prostatic hyperplasia with urinary obstruction 06/18/2010  . OTHER SPECIFIED DISORDER OF STOMACH AND DUODENUM 12/16/2008  . BENIGN  NEOPLASM OF LIVER AND BILIARY PASSAGES 11/15/2008  . GERD 09/02/2008  . COLONIC POLYPS, HYPERPLASTIC 05/24/2007  . Obstructive sleep apnea 05/24/2007  . ALLERGIC RHINITIS 05/24/2007  . ESOPHAGEAL STRICTURE 05/24/2007  . HIATAL HERNIA 05/24/2007  . Hyperlipidemia 02/06/2007  . Essential hypertension 02/06/2007  . CAD (coronary artery disease) 02/06/2007  . Primary osteoarthritis of both hands 02/06/2007  . Backache 02/06/2007    Past Medical History:  Diagnosis Date  . Allergic rhinitis   . Allergy   . Anginal pain ([place]) 05/26/14   chest pain after chasing dog  . CAD (coronary artery disease)    hx of stent- 2005 RCA  . Cataract    beginning stage both eyes  . CHF (congestive heart failure) ([facility])    pacemaker Medtronic    . Chronic back pain    intermittent  . Constipation   . Dizziness   . Dysrhythmia    right bundle branch block   . Esophageal stricture   . GERD (gastroesophageal reflux disease)   . Hemoptysis   . Hiatal hernia   . History of colonic polyps    hyperplastic  . HTN (hypertension)   . Hyperlipidemia   . Hypertrophy of prostate with urinary obstruction and other lower urinary tract symptoms (LUTS)   . OA (osteoarthritis)   . OSA (obstructive sleep apnea)    cpap- 10   . 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 Pneumonia   . Shortness of breath    with exertion  . Sleep apnea    cpap  . Testicular hypofunction     Family History  Problem Relation Age of Onset  . Coronary artery disease Father   . Diabetes Father   . Sudden death Father        due to heart disease  . Heart disease Father   . Heart disease Mother   . Hypertension Unknown   . Stomach cancer Paternal Grandmother   . COPD Brother   . Arthritis Brother   . Heart failure Brother   . Arthritis Daughter   . Colon cancer Neg Hx   . Esophageal cancer Neg Hx   . Colon polyps Neg Hx   . Ulcerative  colitis Neg Hx    Past Surgical History:  Procedure Laterality Date  . BIV ICD INSERTION CRT-D N/A 03/27/2017   Procedure: BIV ICD INSERTION CRT-D;  Surgeon: Constance Haw, MD;  Location: [facility];  Service: Cardiovascular;  Laterality: N/A;  . CARDIAC CATHETERIZATION     '05, last 2009, showing patent RCA stent  . COLONOSCOPY  12/2007   HYPERPLASTIC POLYP  . COLONOSCOPY WITH PROPOFOL N/A 06/02/2014   Procedure: COLONOSCOPY WITH PROPOFOL;  Surgeon: [name], MD;  [facility];  Service: Endoscopy;  Laterality: N/A;  . CORONARY ANGIOPLASTY  08/2003  . ESOPHAGOGASTRODUODENOSCOPY (EGD) WITH PROPOFOL N/A 06/02/2014   Procedure: ESOPHAGOGASTRODUODENOSCOPY (EGD) WITH PROPOFOL;  Surgeon: [name], MD;  [facility];  Service: Endoscopy;  Laterality: N/A;  . hernia surgery x 3     Bilateral Inguinal, Umbicial  . IRRIGATION AND DEBRIDEMENT ABSCESS Left 08/14/2012  Procedure: IRRIGATION AND DEBRIDEMENT LEFT INGUINAL BOIL ;  Surgeon: Ailene Rud, MD;  [facility];  Service: Urology;  Laterality: Left;  . JOINT REPLACEMENT Right 2017  . KNEE ARTHROPLASTY    . LEFT HEART CATHETERIZATION WITH CORONARY ANGIOGRAM N/A 05/26/2014   Procedure: LEFT HEART CATHETERIZATION WITH CORONARY ANGIOGRAM;  Surgeon: Blane Ohara, MD;  Location: [facility];  Service: Cardiovascular;  Laterality: N/A;  . LUMBAR LAMINECTOMY/DECOMPRESSION MICRODISCECTOMY Left 09/26/2014   Procedure: Lumbar Laminectomy for resection of synovial cyst  Lumbar five- sacral one left;  Surgeon: Kary Kos, MD;  Location: [facility];  Service: Neurosurgery;  Laterality: Left;  . Oral surg to removed growth from ?sinus  10/2010   ?Dermoid removed by DrRiggs  . PACEMAKER INSERTION  04/03/2017  . POLYPECTOMY    . RCA stenting     '05 RCA  . right toe surgery  Right    Cyst   . RIGHT/LEFT HEART CATH AND CORONARY ANGIOGRAPHY N/A 01/15/2017   Procedure: RIGHT/LEFT HEART CATH AND CORONARY  ANGIOGRAPHY;  Surgeon: [name], MD;  Location: [facility];  Service: Cardiovascular;  Laterality: N/A;  . s/p right knee arthroscopy  2005  . TOTAL KNEE ARTHROPLASTY Right 03/12/2016   Procedure: RIGHT TOTAL KNEE ARTHROPLASTY;  Surgeon: Paralee Cancel, MD;  [facility];  Service: Orthopedics;  Laterality: Right;  . UPPER GASTROINTESTINAL ENDOSCOPY     Social History   Social History Narrative  . Not on file     Objective: Vital Signs: BP (!) 104/59 (BP Location: Left Arm, Patient Position: Sitting, Cuff Size: Normal)   Pulse 83   Ht 5\' 8"  (1.727 m)   Wt 196 lb (88.9 kg)   BMI 29.80 kg/m    Physical Exam  Constitutional: He is oriented to person, place, and time. He appears well-developed and well-nourished.  HENT:  Head: Normocephalic and atraumatic.  Eyes: Pupils are equal, round, and reactive to light. Conjunctivae and EOM are normal.  Neck: Normal range of motion. Neck supple.  Cardiovascular: Normal rate, regular rhythm and normal heart sounds.  Pulmonary/Chest: Effort normal and breath sounds normal.  Abdominal: Soft. Bowel sounds are normal.  Lymphadenopathy:    He has no cervical adenopathy.  Neurological: He is alert and oriented to person, place, and time.  Skin: Skin is warm and dry. Capillary refill takes less than 2 seconds.  Psychiatric: He has a normal mood and affect. His behavior is normal.  Nursing note and vitals reviewed.    Musculoskeletal Exam: C-spine good ROM.  Thoracic kyphosis.  Lumbar spine limited ROM.  No midline spinal tenderness.  SI joint tenderness bilaterally.  Shoulder joints and elbow joints good ROM.  Tenderness and swelling over right ulnar styloid.  Right wrist limited ROM.  Left wrist good ROM. 2nd and 3rd MCP synovial thickening bilaterally.  PIP and DIP synovial thickening consistent with osteoarthritis.  He has subluxation of several DIP joints.  Hip joints, knee joints, ankle joints, MTPs, PIPs, and DIPs good ROM with  no synovitis.  No warmth or effusion of knee joints.  No tenderness of trochanteric bursa bilaterally.    CDAI Exam: No CDAI exam completed.    Investigation: No additional findings.   Imaging: No results found.  Speciality Comments: No specialty comments available.    Procedures:  No procedures performed Allergies: Sulfamethoxazole-trimethoprim and Sulfonamide derivatives   Assessment / Plan:     Visit Diagnoses: Primary osteoarthritis of both hands: He has severe osteoarthritis in  bilateral hands.  Has PIP and DIP synovial thickening as well as subluxation of several joints.  He also synovial thickening of second and third MCP joints.  Chronic pain in bilateral hands.  He uses Voltaren gel and takes to Tumeric and Cinnamon on a daily basis.  He also takes tramadol for pain relief.  Joint protection and muscle strengthening were discussed.  We will check CCP, RF, 14 3 3  eta, and sed rate today.  Pain and swelling of right wrist -he has been having increased pain and swelling in his right wrist especially over the right ulnar styloid.  He has tenderness to palpation.  RF, CCP, sed rate, and 14 3 3  eta will be checked today.  Plan: 14-3-3 eta Protein, Cyclic citrul peptide antibody, IgG, Rheumatoid factor, Sedimentation rate   Primary osteoarthritis of left knee: No warmth or effusion.  He recently underwent left knee Euflexxa injections which provided temporary relief.  He recently went to [place] for 18 days and was on his feet more than usual, which worsened his knee pain.    Status post right knee replacement: Chronic pain.  He has good range of motion with no warmth or effusion.  Primary osteoarthritis of both feet: He has no discomfort in his feet at this time.  He wears proper fitting shoes.  DDD (degenerative disc disease), lumbar: Chronic pain he recently saw Dr. Saintclair Halsted for evaluation.  He is going to be scheduled for an MRI and is scheduled for injections in his lower  back.  Other chronic pain: He takes tramadol for pain relief.  Other medical conditions are listed as follows:  Other cardiac arrhythmia - RBBB s/p Pacemaker and defibrillator placement 04/03/2017  History of CHF (congestive heart failure)  History of hypertension  History of BPH  History of sleep apnea  History of colon polyps  History of gastric polyp  History of hyperlipidemia  History of coronary artery disease  History of gastroesophageal reflux (GERD)     Orders: Orders Placed This Encounter  Procedures  . 14-3-3 eta Protein  . Cyclic citrul peptide antibody, IgG  . Rheumatoid factor  . Sedimentation rate   No orders of the defined types were placed in this encounter.   Face-to-face time spent with patient was 30 minutes. >50% of time was spent in counseling and coordination of care.  Follow-Up Instructions: Return in about 6 months (around 04/18/2018) for Osteoarthritis.   Ofilia Neas, PA-C   I examined and evaluated the patient with Hazel Sams PA.  Osteoarthritis involving multiple joints.  He had no synovitis on my examination.  The plan of care was discussed as noted above.  Bo Merino, MD  Note - This record has been created using Editor, commissioning.  Chart creation errors have been sought, but may not always  have been located. Such creation errors do not reflect on  the standard of medical care.

## 2017-10-16 ENCOUNTER — Encounter: Payer: Self-pay | Admitting: Rheumatology

## 2017-10-16 ENCOUNTER — Other Ambulatory Visit: Payer: Self-pay | Admitting: Neurosurgery

## 2017-10-16 ENCOUNTER — Ambulatory Visit (INDEPENDENT_AMBULATORY_CARE_PROVIDER_SITE_OTHER): Payer: Medicare Other | Admitting: Rheumatology

## 2017-10-16 VITALS — BP 104/59 | HR 83 | Ht 68.0 in | Wt 196.0 lb

## 2017-10-16 DIAGNOSIS — M19071 Primary osteoarthritis, right ankle and foot: Secondary | ICD-10-CM | POA: Diagnosis not present

## 2017-10-16 DIAGNOSIS — M25531 Pain in right wrist: Secondary | ICD-10-CM

## 2017-10-16 DIAGNOSIS — Z8639 Personal history of other endocrine, nutritional and metabolic disease: Secondary | ICD-10-CM

## 2017-10-16 DIAGNOSIS — M51369 Other intervertebral disc degeneration, lumbar region without mention of lumbar back pain or lower extremity pain: Secondary | ICD-10-CM

## 2017-10-16 DIAGNOSIS — Z8669 Personal history of other diseases of the nervous system and sense organs: Secondary | ICD-10-CM | POA: Diagnosis not present

## 2017-10-16 DIAGNOSIS — G8929 Other chronic pain: Secondary | ICD-10-CM

## 2017-10-16 DIAGNOSIS — M25431 Effusion, right wrist: Secondary | ICD-10-CM

## 2017-10-16 DIAGNOSIS — M5136 Other intervertebral disc degeneration, lumbar region: Secondary | ICD-10-CM | POA: Diagnosis not present

## 2017-10-16 DIAGNOSIS — Z8719 Personal history of other diseases of the digestive system: Secondary | ICD-10-CM | POA: Diagnosis not present

## 2017-10-16 DIAGNOSIS — M19042 Primary osteoarthritis, left hand: Secondary | ICD-10-CM | POA: Diagnosis not present

## 2017-10-16 DIAGNOSIS — I498 Other specified cardiac arrhythmias: Secondary | ICD-10-CM | POA: Diagnosis not present

## 2017-10-16 DIAGNOSIS — M19041 Primary osteoarthritis, right hand: Secondary | ICD-10-CM | POA: Diagnosis not present

## 2017-10-16 DIAGNOSIS — Z860100 Personal history of colon polyps, unspecified: Secondary | ICD-10-CM

## 2017-10-16 DIAGNOSIS — Z87438 Personal history of other diseases of male genital organs: Secondary | ICD-10-CM

## 2017-10-16 DIAGNOSIS — Z96651 Presence of right artificial knee joint: Secondary | ICD-10-CM | POA: Diagnosis not present

## 2017-10-16 DIAGNOSIS — Z8679 Personal history of other diseases of the circulatory system: Secondary | ICD-10-CM | POA: Diagnosis not present

## 2017-10-16 DIAGNOSIS — M5137 Other intervertebral disc degeneration, lumbosacral region: Secondary | ICD-10-CM

## 2017-10-16 DIAGNOSIS — M19072 Primary osteoarthritis, left ankle and foot: Secondary | ICD-10-CM

## 2017-10-16 DIAGNOSIS — M1712 Unilateral primary osteoarthritis, left knee: Secondary | ICD-10-CM

## 2017-10-16 DIAGNOSIS — I2510 Atherosclerotic heart disease of native coronary artery without angina pectoris: Secondary | ICD-10-CM | POA: Diagnosis not present

## 2017-10-21 LAB — SEDIMENTATION RATE: SED RATE: 2 mm/h (ref 0–20)

## 2017-10-21 LAB — 14-3-3 ETA PROTEIN: 14-3-3 eta Protein: <0.2 ng/mL (ref <0.2)

## 2017-10-21 LAB — CYCLIC CITRUL PEPTIDE ANTIBODY, IGG: Cyclic Citrullin Peptide Ab: 35 UNITS — ABNORMAL HIGH

## 2017-10-21 LAB — RHEUMATOID FACTOR: Rhuematoid fact SerPl-aCnc: <14 IU/mL (ref <14)

## 2017-10-21 NOTE — Progress Notes (Signed)
CCP is within weak positive range.  RF and 14-3-3 eta are WNL.  Sed rate WNL.  Please call patient to notify him.  I discussed these results and his recent synovitis of right wrist, and Dr. Estanislado Pandy would like to see if the patient would like to try Plaquenil.  If he is interested, please schedule an appointment for him to come in to discuss PLQ and obtain consent.

## 2017-10-22 ENCOUNTER — Ambulatory Visit: Payer: BLUE CROSS/BLUE SHIELD | Admitting: Rheumatology

## 2017-10-24 NOTE — Progress Notes (Signed)
Office Visit Note  Patient: [name]             Date of Birth: 11/03/1945           MRN: [id number]             PCP: [name], MD Referring: [name], MD Visit Date: 11/04/2017 Occupation: @GUAROCC @    Subjective:  Pain in both hands   History of Present Illness: [name] is a 72 y.o. male with history of osteoarthritis and DDD.  Patient states he continue to have discomfort in bilateral hands and wrist joints.  He states he continues to have swelling in his hands and left wrist.  He states his left knee continues to cause discomfort.  He states he is considering scheduling an appointment with Dr. Sharol Given to discuss a left total knee replacement.  He continues to have chronic pain in right knee replacement that he had performed several years ago.   Activities of Daily Living:  Patient reports morning stiffness for 1  hour.   Patient Reports nocturnal pain.  Difficulty dressing/grooming: Denies Difficulty climbing stairs: Denies Difficulty getting out of chair: Denies Difficulty using hands for taps, buttons, cutlery, and/or writing: Reports   Review of Systems  Constitutional: Negative for fatigue and night sweats.  HENT: Negative.  Negative for mouth sores, mouth dryness and nose dryness.   Eyes: Negative for redness and dryness.  Respiratory: Negative for shortness of breath and difficulty breathing.   Cardiovascular: Negative for chest pain, palpitations, hypertension, irregular heartbeat and swelling in legs/feet.  Gastrointestinal: Negative for blood in stool, constipation and diarrhea.  Endocrine: Negative for increased urination.  Genitourinary: Negative for painful urination.  Musculoskeletal: Positive for joint swelling, morning stiffness and muscle tenderness. Negative for arthralgias, joint pain, myalgias, muscle weakness and myalgias.  Skin: Negative for color change, rash, hair loss, nodules/bumps, skin tightness, ulcers and sensitivity to  sunlight.  Allergic/Immunologic: Negative for susceptible to infections.  Neurological: Negative for dizziness, fainting, memory loss, night sweats and weakness.  Hematological: Negative for swollen glands.  Psychiatric/Behavioral: Negative for depressed mood and sleep disturbance. The patient is not nervous/anxious.     PMFS History:  Patient Active Problem List   Diagnosis Date Noted  . CHF (congestive heart failure) ([place]) 03/27/2017  . DCM (dilated cardiomyopathy) ([place]) 02/11/2017  . Chronic systolic heart failure ([place]) 01/15/2017  . History of pneumonia 10/23/2016  . Osteoarthritis 10/23/2016  . Primary osteoarthritis of both feet 10/04/2016  . Primary osteoarthritis of left knee 10/04/2016  . DDD (degenerative disc disease), lumbar 10/04/2016  . Hemoptysis 05/27/2016  . Overweight (BMI 25.0-29.9) 03/14/2016  . 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  . Parotid mass 08/23/2015  . Dyspnea 11/29/2014  . Synovial cyst of lumbar facet joint 09/26/2014  . Incisional hernia, without obstruction or gangrene 12/06/2013  . Nausea alone 08/27/2013  . Colon cancer screening 08/27/2013  . 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 Benign prostatic hyperplasia with urinary obstruction 06/18/2010  . OTHER SPECIFIED DISORDER OF STOMACH AND DUODENUM 12/16/2008  . BENIGN NEOPLASM OF LIVER AND BILIARY PASSAGES 11/15/2008  . GERD 09/02/2008  . COLONIC POLYPS, HYPERPLASTIC 05/24/2007  . Obstructive sleep apnea 05/24/2007  . ALLERGIC RHINITIS 05/24/2007  . ESOPHAGEAL STRICTURE 05/24/2007  . HIATAL HERNIA 05/24/2007  . Hyperlipidemia 02/06/2007  . Essential hypertension 02/06/2007  . CAD (coronary artery disease) 02/06/2007  . Primary osteoarthritis of both hands 02/06/2007  . Neck pain on right side 02/06/2007    Past  Medical History:  Diagnosis Date  . Allergic rhinitis   . Allergy   . Anginal pain ([place]) 05/26/14   chest pain after chasing dog  . CAD (coronary artery disease)    hx of stent- 2005 RCA  .  Cataract    beginning stage both eyes  . CHF (congestive heart failure) ([facility])    pacemaker [employer]    . Chronic back pain    intermittent  . Constipation   . Dizziness   . Dysrhythmia    right bundle branch block   . Esophageal stricture   . GERD (gastroesophageal reflux disease)   . Hemoptysis   . Hiatal hernia   . History of colonic polyps    hyperplastic  . HTN (hypertension)   . Hyperlipidemia   . Hypertrophy of prostate with urinary obstruction and other lower urinary tract symptoms (LUTS)   . OA (osteoarthritis)   . OSA (obstructive sleep apnea)    cpap- 10   . 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 Pneumonia   . Shortness of breath    with exertion  . Sleep apnea    cpap  . Testicular hypofunction     Family History  Problem Relation Age of Onset  . Coronary artery disease Father   . Diabetes Father   . Sudden death Father        due to heart disease  . Heart disease Father   . Heart disease Mother   . Hypertension Unknown   . Stomach cancer Paternal Grandmother   . COPD Brother   . Arthritis Brother   . Heart failure Brother   . Cancer Brother        lung   . Arthritis Daughter   . Colon cancer Neg Hx   . Esophageal cancer Neg Hx   . Colon polyps Neg Hx   . Ulcerative colitis Neg Hx    Past Surgical History:  Procedure Laterality Date  . BIV ICD INSERTION CRT-D N/A 03/27/2017   Procedure: BIV ICD INSERTION CRT-D;  Surgeon: Constance Haw, MD;  Location: [facility];  Service: Cardiovascular;  Laterality: N/A;  . CARDIAC CATHETERIZATION     '05, last 2009, showing patent RCA stent  . COLONOSCOPY  12/2007   HYPERPLASTIC POLYP  . COLONOSCOPY WITH PROPOFOL N/A 06/02/2014   Procedure: COLONOSCOPY WITH PROPOFOL;  Surgeon: [name], MD;  Location: [facility];  Service: Endoscopy;  Laterality: N/A;  . CORONARY ANGIOPLASTY  08/2003  .  ESOPHAGOGASTRODUODENOSCOPY (EGD) WITH PROPOFOL N/A 06/02/2014   Procedure: ESOPHAGOGASTRODUODENOSCOPY (EGD) WITH PROPOFOL;  Surgeon: [name], MD;  Location: [facility];  Service: Endoscopy;  Laterality: N/A;  . hernia surgery x 3     Bilateral Inguinal, Umbicial  . IRRIGATION AND DEBRIDEMENT ABSCESS Left 08/14/2012   Procedure: IRRIGATION AND DEBRIDEMENT LEFT INGUINAL BOIL ;  Surgeon: Ailene Rud, MD;  Location: [facility];  Service: Urology;  Laterality: Left;  . JOINT REPLACEMENT Right 2017  . KNEE ARTHROPLASTY    . LEFT HEART CATHETERIZATION WITH CORONARY ANGIOGRAM N/A 05/26/2014   Procedure: LEFT HEART CATHETERIZATION WITH CORONARY ANGIOGRAM;  Surgeon: Blane Ohara, MD;  Location: [facility];  Service: Cardiovascular;  Laterality: N/A;  . LUMBAR LAMINECTOMY/DECOMPRESSION MICRODISCECTOMY Left 09/26/2014   Procedure: Lumbar Laminectomy for resection of synovial cyst  Lumbar five- sacral one left;  Surgeon: Kary Kos, MD;  Location: [facility];  Service: Neurosurgery;  Laterality: Left;  . Oral surg to removed growth from ?sinus  10/2010   ?Dermoid removed by DrRiggs  . PACEMAKER INSERTION  04/03/2017  . POLYPECTOMY    . RCA stenting     '05 RCA  . right toe surgery  Right    Cyst   . RIGHT/LEFT HEART CATH AND CORONARY ANGIOGRAPHY N/A 01/15/2017   Procedure: RIGHT/LEFT HEART CATH AND CORONARY ANGIOGRAPHY;  Surgeon: [name], MD;  Location: [facility];  Service: Cardiovascular;  Laterality: N/A;  . s/p right knee arthroscopy  2005  . TOTAL KNEE ARTHROPLASTY Right 03/12/2016   Procedure: RIGHT TOTAL KNEE ARTHROPLASTY;  Surgeon: Paralee Cancel, MD;  Location: [facility];  Service: Orthopedics;  Laterality: Right;  . UPPER GASTROINTESTINAL ENDOSCOPY     Social History   Social History Narrative  . Not on file     Objective: Vital Signs: BP 126/73 (BP Location: Left Arm, Patient Position: Sitting, Cuff Size: Normal)   Pulse 72   Resp 14   Ht 5\' 8"  (1.727 m)    Wt 187 lb (84.8 kg)   BMI 28.43 kg/m    Physical Exam  Constitutional: He is oriented to person, place, and time. He appears well-developed and well-nourished.  HENT:  Head: Normocephalic and atraumatic.  Eyes: Pupils are equal, round, and reactive to light. Conjunctivae and EOM are normal.  Neck: Normal range of motion. Neck supple.  Cardiovascular: Normal rate, regular rhythm and normal heart sounds.  Pulmonary/Chest: Effort normal and breath sounds normal.  Abdominal: Soft. Bowel sounds are normal.  Lymphadenopathy:    He has no cervical adenopathy.  Neurological: He is alert and oriented to person, place, and time.  Skin: Skin is warm and dry. Capillary refill takes less than 2 seconds.  Psychiatric: He has a normal mood and affect. His behavior is normal.  Nursing note and vitals reviewed.    Musculoskeletal Exam: C-spine, thoracic spine, lumbar spine good range of motion.  No midline spinal tenderness.  No SI joint tenderness.  Shoulder joints, elbow joints, wrist joints, MCPs, PIPs, DIPs good range of motion.  He has tenderness of multiple PIP joints as well as right wrist over the ulnar styloid. He has PIP and DIP synovial thickening and subluxation of several joints.  He has synovial thickening of his second and third MCP joints bilaterally.  Hip joints, knee joints, ankle joints, MTPs, PIPs, DIPs good range of motion with no synovitis.  Warmth of his right knee on exam.  He has left knee crepitus.    CDAI Exam: CDAI Homunculus Exam:   Tenderness:  LUE: wrist Right hand: 2nd MCP, 2nd PIP and 3rd PIP Left hand: 2nd PIP and 3rd PIP  Joint Counts:  CDAI Tender Joint count: 6 CDAI Swollen Joint count: 0     Investigation: No additional findings. CBC Latest Ref Rng & Units 03/20/2017 01/15/2017 10/23/2016  WBC 3.4 - 10.8 x10E3/uL 5.6 6.1 6.7  Hemoglobin 13.0 - 17.7 g/dL 15.5 14.4 14.7  Hematocrit 37.5 - 51.0 % 45.1 43.7 43.6  Platelets 150 - 379 x10E3/uL 256 224 242.0    CMP Latest Ref Rng & Units 03/20/2017 02/26/2017 02/12/2017  Glucose 65 - 99 mg/dL 101(H) 127(H) 94  BUN 8 - 27 mg/dL 17 16 18   Creatinine 0.76 - 1.27 mg/dL 0.94 0.95 0.82  Sodium 134 - 144 mmol/L 139 139 138  Potassium 3.5 - 5.2 mmol/L 4.7 4.4 4.7  Chloride 96 - 106 mmol/L 101  104 102  CO2 20 - 29 mmol/L 23 21 20   Calcium 8.6 - 10.2 mg/dL 9.9 9.5 9.7  Total Protein 6.0 - 8.3 g/dL - - -  Total Bilirubin 0.2 - 1.2 mg/dL - - -  Alkaline Phos 39 - 117 U/L - - -  AST 0 - 37 U/L - - -  ALT 0 - 53 U/L - - -     Imaging: Dg Facet Jt Inj L /s  2nd Level Left W/fl/ct  Result Date: 10/29/2017 CLINICAL DATA:  Left low back pain. Facet arthropathy. Some pain on the right, but not as severe. EXAM: Left L4-5 and L5-S1 facet injections COMPARISON:  MRI 10/14/2015. PROCEDURE: The procedure, risks, benefits, and alternatives were explained to the patient. Questions regarding the procedure were encouraged and answered. The patient understands and consents to the procedure. Posterior oblique approaches were taken to the facets on the left at L4-5 and L5-S1 using curved 22 gauge spinal needles. Intra-articular positioning was confirmed by injecting a small amount of Isovue-M 200. No vascular opacification is seen. Sixty mg of Depo-Medrol mixed with 0.5 cc 1% lidocaine were instilled into each joint. The injection seemed to result in concordant pain at each level. The procedure was well-tolerated. FLUOROSCOPY TIME:  1 minutes 45 seconds. 85.85 micro gray meter squared IMPRESSION: Technically successful leftL4-5 and L5-S55facet injection . One could question if there is filling of a synovial cyst inferiorly at L5-S1. Electronically Signed   By: Nelson Chimes M.D.   On: 10/29/2017 08:53   Dg Facet Jt Inj L /s Single Level Left W/fl/ct  Result Date: 10/29/2017 CLINICAL DATA:  Left low back pain. Facet arthropathy. Some pain on the right, but not as severe. EXAM: Left L4-5 and L5-S1 facet injections COMPARISON:  MRI  10/14/2015. PROCEDURE: The procedure, risks, benefits, and alternatives were explained to the patient. Questions regarding the procedure were encouraged and answered. The patient understands and consents to the procedure. Posterior oblique approaches were taken to the facets on the left at L4-5 and L5-S1 using curved 22 gauge spinal needles. Intra-articular positioning was confirmed by injecting a small amount of Isovue-M 200. No vascular opacification is seen. Sixty mg of Depo-Medrol mixed with 0.5 cc 1% lidocaine were instilled into each joint. The injection seemed to result in concordant pain at each level. The procedure was well-tolerated. FLUOROSCOPY TIME:  1 minutes 45 seconds. 85.85 micro gray meter squared IMPRESSION: Technically successful leftL4-5 and L5-S29facet injection . One could question if there is filling of a synovial cyst inferiorly at L5-S1. Electronically Signed   By: Nelson Chimes M.D.   On: 10/29/2017 08:53    Speciality Comments: No specialty comments available.    Procedures:  No procedures performed Allergies: Sulfonamide derivatives   Assessment / Plan:     Visit Diagnoses: Primary osteoarthritis of both hands - CCP +, RF -, 14-3-3 eta negative, Sed rate WNL: He has PIP and DIP synovial thickening as well as subluxation of several joints in his hands.  He has synovial thickening of bilateral second third MCP joints as well.  He has been having increased pain and intermittent swelling in his bilateral hands.  He is also having some swelling and tenderness over the right ulnar styloid.  We discussed starting him on Plaquenil.  Indications, contraindications, and side effects were discussed.  All questions were addressed.  He is given a Plaquenil eye exam form and advised to notify his ophthalmologist today to be started on Plaquenil.  He is  aware of the frequency of lab work.  A prescription for Plaquenil 200 mg twice daily will be sent to the pharmacy today.  He will follow-up in  3 months to see how he is doing.  He was advised to notify us if he develops any side effects.  Patient was counseled on the purpose, proper use, and adverse effects of hydroxychloroquine including nausea/diarrhea, skin rash, headaches, and sun sensitivity.  Discussed importance of annual eye exams while on hydroxychloroquine to monitor to ocular toxicity and discussed importance of frequent laboratory monitoring.  Provided patient with eye exam form for baseline ophthalmologic exam.  Provided patient with educational materials on hydroxychloroquine and answered all questions.  Patient consented to hydroxychloroquine.  Will upload consent in the media tab.    Primary osteoarthritis of left knee: No warmth or effusion noticed.  He has left knee crepitus.  He underwent Euflexxa injections which provided temporary relief.  He is considering moving forward with scheduling a left total knee arthroplasty.  He is considering scheduling appointment with Dr. due to to discuss moving forward with this procedure.  Status post right knee replacement: Chronic pain.  He has warmth on exam.  Primary osteoarthritis of both feet: He has no discomfort in his feet at this time.  He was proper fitting shoes.  DDD (degenerative disc disease), lumbar: He had a cortisone injection last Wednesday which has been providing some pain relief.  He sees Dr. Saintclair Halsted.  Other medical conditions are listed as follows:   Other chronic pain  Other cardiac arrhythmia  History of CHF (congestive heart failure)  History of hypertension  History of BPH  History of sleep apnea  History of colon polyps  History of gastric polyp  History of hyperlipidemia  History of coronary artery disease  History of gastroesophageal reflux (GERD)    Orders: No orders of the defined types were placed in this encounter.  No orders of the defined types were placed in this encounter.   Face-to-face time spent with patient was 30  minutes. >50% of time was spent in counseling and coordination of care.  Follow-Up Instructions: Return in about 3 months (around 02/04/2018) for Inflammatory arthritis, DDD.   Ofilia Neas, PA-C  Bo Merino, MD Note - This record has been created using Dragon software.  Chart creation errors have been sought, but may not always  have been located. Such creation errors do not reflect on  the standard of medical care.

## 2017-10-25 LAB — CUP PACEART REMOTE DEVICE CHECK
Brady Statistic AP VS Percent: 0.03 %
Brady Statistic AS VP Percent: 93.4 %
Brady Statistic RA Percent Paced: 1.72 %
Brady Statistic RV Percent Paced: 41.25 %
Date Time Interrogation Session: 20190522063325
HIGH POWER IMPEDANCE MEASURED VALUE: 66 Ohm
Implantable Lead Implant Date: 20181108
Implantable Lead Location: 753859
Implantable Pulse Generator Implant Date: 20181108
Lead Channel Impedance Value: 1007 Ohm
Lead Channel Impedance Value: 176 Ohm
Lead Channel Impedance Value: 176 Ohm
Lead Channel Impedance Value: 256.667
Lead Channel Impedance Value: 256.667
Lead Channel Impedance Value: 418 Ohm
Lead Channel Impedance Value: 513 Ohm
Lead Channel Impedance Value: 532 Ohm
Lead Channel Impedance Value: 608 Ohm
Lead Channel Impedance Value: 646 Ohm
Lead Channel Impedance Value: 665 Ohm
Lead Channel Impedance Value: 988 Ohm
Lead Channel Pacing Threshold Amplitude: 0.5 V
Lead Channel Pacing Threshold Amplitude: 2.75 V
Lead Channel Sensing Intrinsic Amplitude: 16.25 mV
Lead Channel Sensing Intrinsic Amplitude: 3.375 mV
Lead Channel Setting Pacing Amplitude: 2.5 V
Lead Channel Setting Pacing Amplitude: 3.75 V
Lead Channel Setting Pacing Pulse Width: 0.4 ms
MDC IDC LEAD IMPLANT DT: 20181108
MDC IDC LEAD IMPLANT DT: 20181108
MDC IDC LEAD LOCATION: 753858
MDC IDC LEAD LOCATION: 753860
MDC IDC MSMT BATTERY REMAINING LONGEVITY: 81 mo
MDC IDC MSMT BATTERY VOLTAGE: 3.02 V
MDC IDC MSMT LEADCHNL LV IMPEDANCE VALUE: 208.627
MDC IDC MSMT LEADCHNL LV IMPEDANCE VALUE: 304 Ohm
MDC IDC MSMT LEADCHNL LV IMPEDANCE VALUE: 418 Ohm
MDC IDC MSMT LEADCHNL LV IMPEDANCE VALUE: 608 Ohm
MDC IDC MSMT LEADCHNL LV IMPEDANCE VALUE: 893 Ohm
MDC IDC MSMT LEADCHNL LV PACING THRESHOLD PULSEWIDTH: 0.5 ms
MDC IDC MSMT LEADCHNL RA IMPEDANCE VALUE: 418 Ohm
MDC IDC MSMT LEADCHNL RA PACING THRESHOLD AMPLITUDE: 0.625 V
MDC IDC MSMT LEADCHNL RA PACING THRESHOLD PULSEWIDTH: 0.4 ms
MDC IDC MSMT LEADCHNL RA SENSING INTR AMPL: 3.375 mV
MDC IDC MSMT LEADCHNL RV PACING THRESHOLD PULSEWIDTH: 0.4 ms
MDC IDC MSMT LEADCHNL RV SENSING INTR AMPL: 16.25 mV
MDC IDC SET LEADCHNL LV PACING PULSEWIDTH: 0.5 ms
MDC IDC SET LEADCHNL RA PACING AMPLITUDE: 2 V
MDC IDC SET LEADCHNL RV SENSING SENSITIVITY: 0.3 mV
MDC IDC STAT BRADY AP VP PERCENT: 1.71 %
MDC IDC STAT BRADY AS VS PERCENT: 4.87 %

## 2017-10-27 ENCOUNTER — Telehealth: Payer: Self-pay

## 2017-10-27 ENCOUNTER — Other Ambulatory Visit (HOSPITAL_COMMUNITY): Payer: Self-pay | Admitting: Neurosurgery

## 2017-10-27 DIAGNOSIS — M7130 Other bursal cyst, unspecified site: Secondary | ICD-10-CM

## 2017-10-27 NOTE — Telephone Encounter (Signed)
Called and spoke with patient about recommended changes from Dr. Curt Bears about increasing Coreg. Patient requested an appointment with WC prior to making any changes to discuss further.

## 2017-10-27 NOTE — Telephone Encounter (Signed)
-----   Message from Will [name], MD sent at 10/27/2017 10:24 AM EDT ----- Abnormal device interrogation reviewed.  Lead parameters and battery status stable.  Increase coreg to 25 due to NSVT.

## 2017-10-29 ENCOUNTER — Ambulatory Visit
Admission: RE | Admit: 2017-10-29 | Discharge: 2017-10-29 | Disposition: A | Discharge status: Home / Self Care | Payer: Medicare Other | Source: Ambulatory Visit | Attending: Neurosurgery | Admitting: Neurosurgery

## 2017-10-29 DIAGNOSIS — M5137 Other intervertebral disc degeneration, lumbosacral region: Secondary | ICD-10-CM

## 2017-10-29 DIAGNOSIS — M545 Low back pain: Secondary | ICD-10-CM | POA: Diagnosis not present

## 2017-10-29 MED ORDER — IOPAMIDOL (ISOVUE-M 200) INJECTION 41%
1.0000 mL | Freq: Once | INTRAMUSCULAR | Status: AC
  Administered 2017-10-29: 1 mL via INTRA_ARTICULAR

## 2017-10-29 MED ORDER — METHYLPREDNISOLONE ACETATE 40 MG/ML INJ SUSP (RADIOLOG
120.0000 mg | Freq: Once | INTRAMUSCULAR | Status: AC
  Administered 2017-10-29: 120 mg via INTRA_ARTICULAR

## 2017-10-29 NOTE — Discharge Instructions (Signed)

## 2017-11-04 ENCOUNTER — Other Ambulatory Visit: Payer: Self-pay | Admitting: Physician Assistant

## 2017-11-04 ENCOUNTER — Encounter: Payer: Self-pay | Admitting: Physician Assistant

## 2017-11-04 ENCOUNTER — Ambulatory Visit (INDEPENDENT_AMBULATORY_CARE_PROVIDER_SITE_OTHER): Payer: Medicare Other | Admitting: Physician Assistant

## 2017-11-04 VITALS — BP 126/73 | HR 72 | Resp 14 | Ht 68.0 in | Wt 187.0 lb

## 2017-11-04 DIAGNOSIS — M25431 Effusion, right wrist: Secondary | ICD-10-CM

## 2017-11-04 DIAGNOSIS — I2510 Atherosclerotic heart disease of native coronary artery without angina pectoris: Secondary | ICD-10-CM | POA: Diagnosis not present

## 2017-11-04 DIAGNOSIS — M19042 Primary osteoarthritis, left hand: Secondary | ICD-10-CM | POA: Diagnosis not present

## 2017-11-04 DIAGNOSIS — Z8679 Personal history of other diseases of the circulatory system: Secondary | ICD-10-CM | POA: Diagnosis not present

## 2017-11-04 DIAGNOSIS — Z86010 Personal history of colonic polyps: Secondary | ICD-10-CM

## 2017-11-04 DIAGNOSIS — M1712 Unilateral primary osteoarthritis, left knee: Secondary | ICD-10-CM | POA: Diagnosis not present

## 2017-11-04 DIAGNOSIS — M19072 Primary osteoarthritis, left ankle and foot: Secondary | ICD-10-CM

## 2017-11-04 DIAGNOSIS — M19041 Primary osteoarthritis, right hand: Secondary | ICD-10-CM | POA: Diagnosis not present

## 2017-11-04 DIAGNOSIS — Z8669 Personal history of other diseases of the nervous system and sense organs: Secondary | ICD-10-CM

## 2017-11-04 DIAGNOSIS — G8929 Other chronic pain: Secondary | ICD-10-CM

## 2017-11-04 DIAGNOSIS — Z8719 Personal history of other diseases of the digestive system: Secondary | ICD-10-CM | POA: Diagnosis not present

## 2017-11-04 DIAGNOSIS — I498 Other specified cardiac arrhythmias: Secondary | ICD-10-CM

## 2017-11-04 DIAGNOSIS — M19071 Primary osteoarthritis, right ankle and foot: Secondary | ICD-10-CM | POA: Diagnosis not present

## 2017-11-04 DIAGNOSIS — Z87438 Personal history of other diseases of male genital organs: Secondary | ICD-10-CM | POA: Diagnosis not present

## 2017-11-04 DIAGNOSIS — Z96651 Presence of right artificial knee joint: Secondary | ICD-10-CM | POA: Diagnosis not present

## 2017-11-04 DIAGNOSIS — Z8639 Personal history of other endocrine, nutritional and metabolic disease: Secondary | ICD-10-CM

## 2017-11-04 DIAGNOSIS — M25531 Pain in right wrist: Secondary | ICD-10-CM

## 2017-11-04 DIAGNOSIS — M5136 Other intervertebral disc degeneration, lumbar region: Secondary | ICD-10-CM | POA: Diagnosis not present

## 2017-11-04 MED ORDER — HYDROXYCHLOROQUINE SULFATE 200 MG PO TABS: ORAL_TABLET | ORAL | 0 refills | Status: DC

## 2017-11-04 NOTE — Patient Instructions (Signed)

## 2017-11-07 ENCOUNTER — Ambulatory Visit (HOSPITAL_COMMUNITY)
Admission: RE | Admit: 2017-11-07 | Discharge: 2017-11-07 | Disposition: A | Discharge status: Home / Self Care | Payer: Medicare Other | Source: Ambulatory Visit | Attending: Neurosurgery | Admitting: Neurosurgery

## 2017-11-07 ENCOUNTER — Encounter (HOSPITAL_COMMUNITY): Payer: Self-pay | Admitting: Radiology

## 2017-11-07 DIAGNOSIS — M7130 Other bursal cyst, unspecified site: Secondary | ICD-10-CM | POA: Diagnosis not present

## 2017-11-07 DIAGNOSIS — M5136 Other intervertebral disc degeneration, lumbar region: Secondary | ICD-10-CM | POA: Diagnosis not present

## 2017-11-07 DIAGNOSIS — M5137 Other intervertebral disc degeneration, lumbosacral region: Secondary | ICD-10-CM | POA: Diagnosis not present

## 2017-11-07 DIAGNOSIS — M5126 Other intervertebral disc displacement, lumbar region: Secondary | ICD-10-CM | POA: Diagnosis not present

## 2017-11-07 DIAGNOSIS — M48061 Spinal stenosis, lumbar region without neurogenic claudication: Secondary | ICD-10-CM | POA: Insufficient documentation

## 2017-11-07 LAB — POCT I-STAT CREATININE: Creatinine, Ser: 1 mg/dL (ref 0.61–1.24)

## 2017-11-07 MED ORDER — GADOBENATE DIMEGLUMINE 529 MG/ML IV SOLN
18.0000 mL | Freq: Once | INTRAVENOUS | Status: AC | PRN
  Administered 2017-11-07: 18 mL via INTRAVENOUS

## 2017-11-11 ENCOUNTER — Ambulatory Visit (INDEPENDENT_AMBULATORY_CARE_PROVIDER_SITE_OTHER): Payer: Medicare Other | Admitting: Cardiology

## 2017-11-11 ENCOUNTER — Encounter: Payer: Self-pay | Admitting: Cardiology

## 2017-11-11 VITALS — BP 126/78 | HR 72 | Ht 68.0 in | Wt 184.4 lb

## 2017-11-11 DIAGNOSIS — I10 Essential (primary) hypertension: Secondary | ICD-10-CM

## 2017-11-11 DIAGNOSIS — I2510 Atherosclerotic heart disease of native coronary artery without angina pectoris: Secondary | ICD-10-CM

## 2017-11-11 DIAGNOSIS — I5022 Chronic systolic (congestive) heart failure: Secondary | ICD-10-CM

## 2017-11-11 DIAGNOSIS — E785 Hyperlipidemia, unspecified: Secondary | ICD-10-CM

## 2017-11-11 LAB — CUP PACEART INCLINIC DEVICE CHECK
Battery Voltage: 2.98 V
Brady Statistic AP VP Percent: 6.49 %
Brady Statistic AP VS Percent: 0.12 %
Brady Statistic AS VP Percent: 91.99 %
Brady Statistic RA Percent Paced: 6.6 %
Brady Statistic RV Percent Paced: 13.61 %
HIGH POWER IMPEDANCE MEASURED VALUE: 72 Ohm
Implantable Lead Implant Date: 20181108
Implantable Lead Implant Date: 20181108
Implantable Lead Location: 753860
Implantable Lead Model: 5076
Lead Channel Impedance Value: 1121 Ohm
Lead Channel Impedance Value: 193.707
Lead Channel Impedance Value: 205.114
Lead Channel Impedance Value: 292.308
Lead Channel Impedance Value: 475 Ohm
Lead Channel Impedance Value: 551 Ohm
Lead Channel Impedance Value: 608 Ohm
Lead Channel Impedance Value: 722 Ohm
Lead Channel Impedance Value: 760 Ohm
Lead Channel Pacing Threshold Amplitude: 0.5 V
Lead Channel Pacing Threshold Pulse Width: 0.5 ms
Lead Channel Sensing Intrinsic Amplitude: 16.875 mV
Lead Channel Sensing Intrinsic Amplitude: 3.25 mV
Lead Channel Sensing Intrinsic Amplitude: 3.75 mV
Lead Channel Setting Pacing Amplitude: 3 V
MDC IDC LEAD IMPLANT DT: 20181108
MDC IDC LEAD LOCATION: 753858
MDC IDC LEAD LOCATION: 753859
MDC IDC MSMT BATTERY REMAINING LONGEVITY: 95 mo
MDC IDC MSMT LEADCHNL LV IMPEDANCE VALUE: 1026 Ohm
MDC IDC MSMT LEADCHNL LV IMPEDANCE VALUE: 1083 Ohm
MDC IDC MSMT LEADCHNL LV IMPEDANCE VALUE: 244.746
MDC IDC MSMT LEADCHNL LV IMPEDANCE VALUE: 269.677
MDC IDC MSMT LEADCHNL LV IMPEDANCE VALUE: 361 Ohm
MDC IDC MSMT LEADCHNL LV IMPEDANCE VALUE: 418 Ohm
MDC IDC MSMT LEADCHNL LV IMPEDANCE VALUE: 665 Ohm
MDC IDC MSMT LEADCHNL LV PACING THRESHOLD AMPLITUDE: 2 V
MDC IDC MSMT LEADCHNL RA IMPEDANCE VALUE: 456 Ohm
MDC IDC MSMT LEADCHNL RA PACING THRESHOLD AMPLITUDE: 0.75 V
MDC IDC MSMT LEADCHNL RA PACING THRESHOLD PULSEWIDTH: 0.4 ms
MDC IDC MSMT LEADCHNL RV IMPEDANCE VALUE: 722 Ohm
MDC IDC MSMT LEADCHNL RV PACING THRESHOLD PULSEWIDTH: 0.4 ms
MDC IDC MSMT LEADCHNL RV SENSING INTR AMPL: 16.625 mV
MDC IDC PG IMPLANT DT: 20181108
MDC IDC SESS DTM: 20190625104948
MDC IDC SET LEADCHNL LV PACING PULSEWIDTH: 0.5 ms
MDC IDC SET LEADCHNL RA PACING AMPLITUDE: 2 V
MDC IDC SET LEADCHNL RV PACING AMPLITUDE: 2.5 V
MDC IDC SET LEADCHNL RV PACING PULSEWIDTH: 0.4 ms
MDC IDC SET LEADCHNL RV SENSING SENSITIVITY: 0.3 mV
MDC IDC STAT BRADY AS VS PERCENT: 1.41 %

## 2017-11-11 MED ORDER — CARVEDILOL 25 MG PO TABS: 25.0000 mg | ORAL_TABLET | Freq: Two times a day (BID) | ORAL | 2 refills | Status: DC

## 2017-11-11 NOTE — Progress Notes (Signed)
Electrophysiology Office Note   Date:  11/11/2017   ID:  NESTER BACHUS, DOB [birth year], MRN [id number]  PCP:  [name], MD  Cardiologist:  Burt Knack Primary Electrophysiologist:  Inika Bellanger [name], MD    Chief Complaint  Patient presents with  . Pacemaker Check    Dilated cardiomyopathy/Chronic systolic HF/NSVT     History of Present Illness: [name] is a 72 y.o. male who is being seen today for the evaluation of CHF at the request of [name], MD. Presenting today for electrophysiology evaluation. He has a history of chronic systolic heart failure, coronary artery disease status post stenting of the RCA, left bundle branch block. He also has sleep apnea and uses a CPAP at night.  Medtronic CRT-D implanted 03/27/17.  Implant was comp gated by microperforation.  He was put on colchicine and that greatly improved his symptoms.    Today, denies symptoms of palpitations, chest pain, shortness of breath, orthopnea, PND, lower extremity edema, claudication, dizziness, presyncope, syncope, bleeding, or neurologic sequela. The patient is tolerating medications without difficulties.  Overall feeling well he does not have any major complaints.  He has been having some back pain and had an MRI that showed degenerative disease.  He has not had chest pain or shortness of breath.  His primary physician wishes to put him on hydroxychloroquine for his arthritis.  Past Medical History:  Diagnosis Date  . Allergic rhinitis   . Allergy   . Anginal pain ([place]) 05/26/14   chest pain after chasing dog  . CAD (coronary artery disease)    hx of stent- 2005 RCA  . Cataract    beginning stage both eyes  . CHF (congestive heart failure) (HCC)    pacemaker Medtronic    . Chronic back pain    intermittent  . Constipation   . Dizziness   . Dysrhythmia    right bundle branch block   . Esophageal stricture   . GERD (gastroesophageal reflux disease)   . Hemoptysis   . Hiatal hernia   .  History of colonic polyps    hyperplastic  . HTN (hypertension)   . Hyperlipidemia   . Hypertrophy of prostate with urinary obstruction and other lower urinary tract symptoms (LUTS)   . OA (osteoarthritis)   . OSA (obstructive sleep apnea)    cpap- 10   . 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 Pneumonia   . Shortness of breath    with exertion  . Sleep apnea    cpap  . Testicular hypofunction    Past Surgical History:  Procedure Laterality Date  . BIV ICD INSERTION CRT-D N/A 03/27/2017   Procedure: BIV ICD INSERTION CRT-D;  Surgeon: Constance Haw, MD;  Location: [facility];  Service: Cardiovascular;  Laterality: N/A;  . CARDIAC CATHETERIZATION     '05, last 2009, showing patent RCA stent  . COLONOSCOPY  12/2007   HYPERPLASTIC POLYP  . COLONOSCOPY WITH PROPOFOL N/A 06/02/2014   Procedure: COLONOSCOPY WITH PROPOFOL;  Surgeon: [name], MD;  Location: [facility];  Service: Endoscopy;  Laterality: N/A;  . CORONARY ANGIOPLASTY  08/2003  . ESOPHAGOGASTRODUODENOSCOPY (EGD) WITH PROPOFOL N/A 06/02/2014   Procedure: ESOPHAGOGASTRODUODENOSCOPY (EGD) WITH PROPOFOL;  Surgeon: [name], MD;  Location: [facility];  Service: Endoscopy;  Laterality: N/A;  . hernia surgery x 3     Bilateral Inguinal, Umbicial  .  IRRIGATION AND DEBRIDEMENT ABSCESS Left 08/14/2012   Procedure: IRRIGATION AND DEBRIDEMENT LEFT INGUINAL BOIL ;  Surgeon: Ailene Rud, MD;  Location: [facility];  Service: Urology;  Laterality: Left;  . JOINT REPLACEMENT Right 2017  . KNEE ARTHROPLASTY    . LEFT HEART CATHETERIZATION WITH CORONARY ANGIOGRAM N/A 05/26/2014   Procedure: LEFT HEART CATHETERIZATION WITH CORONARY ANGIOGRAM;  Surgeon: Blane Ohara, MD;  Location: [facility];  Service: Cardiovascular;  Laterality: N/A;  . LUMBAR LAMINECTOMY/DECOMPRESSION MICRODISCECTOMY Left 09/26/2014   Procedure: Lumbar  Laminectomy for resection of synovial cyst  Lumbar five- sacral one left;  Surgeon: Kary Kos, MD;  Location: [facility];  Service: Neurosurgery;  Laterality: Left;  . Oral surg to removed growth from ?sinus  10/2010   ?Dermoid removed by DrRiggs  . PACEMAKER INSERTION  04/03/2017  . POLYPECTOMY    . RCA stenting     '05 RCA  . right toe surgery  Right    Cyst   . RIGHT/LEFT HEART CATH AND CORONARY ANGIOGRAPHY N/A 01/15/2017   Procedure: RIGHT/LEFT HEART CATH AND CORONARY ANGIOGRAPHY;  Surgeon: [name], MD;  Location: [place] [facility];  Service: Cardiovascular;  Laterality: N/A;  . s/p right knee arthroscopy  2005  . TOTAL KNEE ARTHROPLASTY Right 03/12/2016   Procedure: RIGHT TOTAL KNEE ARTHROPLASTY;  Surgeon: Paralee Cancel, MD;  Location: [facility];  Service: Orthopedics;  Laterality: Right;  . UPPER GASTROINTESTINAL ENDOSCOPY       Current Outpatient Medications  Medication Sig Dispense Refill  . aspirin EC 81 MG tablet Take 1 tablet (81 mg total) by mouth daily.    Marland Kitchen atorvastatin (LIPITOR) 20 MG tablet Take 20 mg by mouth daily.    Marland Kitchen azithromycin (ZITHROMAX) 250 MG tablet Take as directed (Patient taking differently: as needed. Take as directed) 6 tablet 0  . b complex vitamins tablet Take 1 tablet by mouth daily with lunch.     . calcium carbonate (OSCAL) 1500 (600 Ca) MG TABS tablet Take 600 mg by mouth every evening.    . carvedilol (COREG) 12.5 MG tablet Take 1 tablet (12.5 mg total) by mouth 2 (two) times daily with a meal. 180 tablet 3  . CINNAMON PO Take 1 tablet by mouth daily with lunch.     . Coenzyme Q10 (CO Q 10) 100 MG CAPS Take 1 capsule by mouth daily with lunch.     . Cream Base (PCCA LIPODERM BASE) CREA Apply 4 mLs topically daily.    . diclofenac sodium (VOLTAREN) 1 % GEL APPLY 2 GRAMS TO AFFECTED AREA 2 TO 4 TIMES DAILY AS NEEDED FOR PAIN.  3  . folic acid (FOLVITE) 161 MCG tablet Take 400 mcg by mouth daily.     . Glucosamine HCl (GLUCOSAMINE PO) Take 1,500  mg by mouth every evening.    . hydroxychloroquine (PLAQUENIL) 200 MG tablet Take one tablet (200 mg) twice daily Monday through Friday only. Do not take Saturday or Sundays. 120 tablet 0  . hydroxypropyl methylcellulose / hypromellose (ISOPTO TEARS / GONIOVISC) 2.5 % ophthalmic solution Place 1 drop into both eyes 4 (four) times daily as needed for dry eyes.     Marland Kitchen MAGNESIUM PO Take 1 tablet by mouth daily as needed (for cramping).     . Multiple Vitamin (MULTIVITAMIN) capsule Take 1 capsule by mouth daily.      . Multiple Vitamins-Minerals (OCUVITE PO) Take 1 tablet by mouth at bedtime.    . nabumetone (RELAFEN) 500 MG  tablet Take 1 tablet (500 mg total) by mouth 2 (two) times daily. 180 tablet 0  . NON FORMULARY CPAP machine with sleep.    . Omega-3 Fatty Acids (FISH OIL) 500 MG CAPS Take 500 mg by mouth at bedtime.     . pantoprazole (PROTONIX) 40 MG tablet TAKE 1 TABLET BY MOUTH EVERY DAY BEFORE BREAKFAST 90 tablet 3  . Probiotic Product (PROBIOTIC DAILY PO) Take 1 capsule by mouth daily with lunch.     . sacubitril-valsartan (ENTRESTO) 49-51 MG Take 1 tablet by mouth 2 (two) times daily. 180 tablet 3  . Saw Palmetto, Serenoa repens, (SAW PALMETTO PO) Take 1 capsule by mouth every evening.    Marland Kitchen spironolactone (ALDACTONE) 25 MG tablet Take 0.5 tablets (12.5 mg total) by mouth daily. 15 tablet 11  . tamsulosin (FLOMAX) 0.4 MG CAPS capsule Take 0.4 mg by mouth at bedtime.    . traMADol (ULTRAM) 50 MG tablet Take 50 mg by mouth daily with lunch.     . TURMERIC PO Take 1 tablet by mouth daily with lunch.      Current Facility-Administered Medications  Medication Dose Route Frequency Provider Last Rate Last Dose  . 0.9 %  sodium chloride infusion  500 mL Intravenous Once [name], MD        Allergies:   Sulfonamide derivatives   Social History:  The patient  reports that he quit smoking about 27 years ago. He has never used smokeless tobacco. He reports that he drinks alcohol. He  reports that he does not use drugs.   Family History:  The patient's family history includes Arthritis in his brother and daughter; COPD in his brother; Cancer in his brother; Coronary artery disease in his father; Diabetes in his father; Heart disease in his father and mother; Heart failure in his brother; Hypertension in his unknown relative; Stomach cancer in his paternal grandmother; Sudden death in his father.   ROS:  Please see the history of present illness.   Otherwise, review of systems is positive for back pain.   All other systems are reviewed and negative.   PHYSICAL EXAM: VS:  BP 126/78   Pulse 72   Ht 5\' 8"  (1.727 m)   Wt 184 lb 6.4 oz (83.6 kg)   BMI 28.04 kg/m  , BMI Body mass index is 28.04 kg/m. GEN: Well nourished, well developed, in no acute distress  HEENT: normal  Neck: no JVD, carotid bruits, or masses Cardiac: RRR; no murmurs, rubs, or gallops,no edema  Respiratory:  clear to auscultation bilaterally, normal work of breathing GI: soft, nontender, nondistended, + BS MS: no deformity or atrophy  Skin: warm and dry, device site well healed Neuro:  Strength and sensation are intact Psych: euthymic mood, full affect  EKG:  EKG is ordered today. Personal review of the ekg ordered shows A sense, V paced  Personal review of the device interrogation today. Results in [facility]: 11/27/2016: Magnesium 2.1; NT-Pro BNP 276 03/20/2017: BUN 17; Hemoglobin 15.5; Platelets 256; Potassium 4.7; Sodium 139 11/07/2017: Creatinine, Ser 1.00    Lipid Panel     Component Value Date/Time   CHOL 138 11/27/2016 0914   TRIG 188 (H) 11/27/2016 0914   HDL 35 (L) 11/27/2016 0914   CHOLHDL 3.9 11/27/2016 0914   CHOLHDL 4 12/07/2015 0742   VLDL 30.6 12/07/2015 0742   LDLCALC 65 11/27/2016 0914     Wt Readings from Last 3 Encounters:  11/11/17 184 lb  6.4 oz (83.6 kg)  11/04/17 187 lb (84.8 kg)  10/16/17 196 lb (88.9 kg)      Other studies Reviewed: Additional  studies/ records that were reviewed today include: TTE 06/25/16 Review of the above records today demonstrates:  - Left ventricle: The cavity size was normal. Systolic function was   normal. The estimated ejection fraction was in the range of 55%   to 60%. Wall motion was normal; there were no regional wall   motion abnormalities. Doppler parameters are consistent with   abnormal left ventricular relaxation (grade 1 diastolic   dysfunction). - Ventricular septum: Septal motion showed dyssynergy. These   changes are consistent with a left bundle branch block. - Aortic valve: There was trivial regurgitation. - Atrial septum: There was increased thickness of the septum,   consistent with lipomatous hypertrophy.  LHC/RHC 01/05/17 1. Widely patent coronary arteries with continued patency of the stented segment in the right coronary artery 2. Mild nonobstructive coronary artery disease as detailed with mild stenosis of the proximal to mid LAD and otherwise minimal luminal irregularities 3. Well compensated right-sided cardiac hemodynamics     ASSESSMENT AND PLAN:  1.  Chronic systolic heart failure: This post Medtronic CRT D implanted 03/27/2017.  Has had improvement of his ejection fraction to 55 to 60%.  No changes in his device at this time.  He does say that if he gets ill with some serious medical condition that could be life-threatening that he would potentially want his defibrillator turned off.  No changes.    2. Coronary artery disease, native vessel, without angina: No chest pain.  No changes.  3. Hyperlipidemia: Continue statin  4. Hypertension: Well-controlled.  No changes.    Current medicines are reviewed at length with the patient today.   The patient does not have concerns regarding his medicines.  The following changes were made today: Increase Coreg  Labs/ tests ordered today include:  Orders Placed This Encounter  Procedures  . EKG 12-Lead     Disposition:   FU  with Ashanta Amoroso 7 months  Signed, Shanon Seawright [name], MD  11/11/2017 9:55 AM     [facility] [address] [place] [address] [phone] (office) [phone] (fax)

## 2017-11-11 NOTE — Patient Instructions (Addendum)
Medication Instructions:  Your physician has recommended you make the following change in your medication:  1. INCREASE Carvedilol to 25 mg twice daily  *If you need a refill on your cardiac medications before your next appointment, please call your pharmacy*  Labwork: None ordered  Testing/Procedures: None ordered  Follow-Up: Remote monitoring is used to monitor your Pacemaker or ICD from home. This monitoring reduces the number of office visits required to check your device to one time per year. It allows [place] to keep an eye on the functioning of your device to ensure it is working properly. You are scheduled for a device check from home on 01/07/2018. You may send your transmission at any time that day. If you have a wireless device, the transmission will be sent automatically. After your physician reviews your transmission, you will receive a postcard with your next transmission date.  Your physician wants you to follow-up in: February 2020 with Dr. Curt Bears.  You will receive a reminder letter in the mail two months in advance. If you don't receive a letter, please call our office to schedule the follow-up appointment.  Thank you for choosing [facility]!!   Trinidad Curet, RN [phone]

## 2017-11-25 ENCOUNTER — Telehealth: Payer: Self-pay | Admitting: Pulmonary Disease

## 2017-11-25 DIAGNOSIS — K432 Incisional hernia without obstruction or gangrene: Secondary | ICD-10-CM

## 2017-11-25 DIAGNOSIS — Z6828 Body mass index (BMI) 28.0-28.9, adult: Secondary | ICD-10-CM | POA: Diagnosis not present

## 2017-11-25 DIAGNOSIS — M5440 Lumbago with sciatica, unspecified side: Secondary | ICD-10-CM | POA: Diagnosis not present

## 2017-11-25 DIAGNOSIS — I10 Essential (primary) hypertension: Secondary | ICD-10-CM | POA: Diagnosis not present

## 2017-11-25 NOTE — Telephone Encounter (Signed)
Spoke with patient-states he needs to have referral and would like to know who he should see for surgery for his hernia. States he trust Dr. Lenna Gilford to give him a good Psychologist, sport and exercise. Pt aware that SN will address this matter tomorrow.

## 2017-11-26 NOTE — Telephone Encounter (Signed)
Per SN- Would recommend Dr. Alphonsa Overall, at [facility]. Notified Patient of recommendation.  Referral placed for hernia surgery. Nothing further at this time.

## 2017-12-04 DIAGNOSIS — E291 Testicular hypofunction: Secondary | ICD-10-CM | POA: Diagnosis not present

## 2017-12-04 DIAGNOSIS — N401 Enlarged prostate with lower urinary tract symptoms: Secondary | ICD-10-CM | POA: Diagnosis not present

## 2017-12-16 ENCOUNTER — Other Ambulatory Visit: Payer: Self-pay | Admitting: *Deleted

## 2017-12-16 MED ORDER — NABUMETONE 500 MG PO TABS: 500.0000 mg | ORAL_TABLET | Freq: Two times a day (BID) | ORAL | 0 refills | Status: DC

## 2017-12-16 NOTE — Telephone Encounter (Signed)
Refill request received via fax   Last visit: 11/04/17 Next Visit: 02/04/18 Labs: 03/20/17 WNL   Patient advised he is due for labs and will come next week to update.    Okay to refill 30 day supply per Dr. Estanislado Pandy

## 2017-12-19 ENCOUNTER — Other Ambulatory Visit: Payer: Self-pay | Admitting: Cardiovascular Disease

## 2017-12-21 ENCOUNTER — Other Ambulatory Visit: Payer: Self-pay | Admitting: Physician Assistant

## 2017-12-22 ENCOUNTER — Telehealth: Payer: Self-pay | Admitting: Rheumatology

## 2017-12-22 ENCOUNTER — Ambulatory Visit: Payer: Medicare Other | Admitting: Physician Assistant

## 2017-12-22 DIAGNOSIS — Z79899 Other long term (current) drug therapy: Secondary | ICD-10-CM

## 2017-12-22 NOTE — Telephone Encounter (Signed)
Patient called stating he is planning to come into the office tomorrow morning 12/23/17 for his labwork and is checking to see if he needs to fast.  Patient is requesting a return call.

## 2017-12-22 NOTE — Telephone Encounter (Signed)
Returned patient's call and advised patient that he does not need to be fasting. Patient verbalized understanding. Lab orders for CBC and CMP have been placed.

## 2017-12-23 ENCOUNTER — Other Ambulatory Visit: Payer: Self-pay

## 2017-12-23 DIAGNOSIS — Z79899 Other long term (current) drug therapy: Secondary | ICD-10-CM

## 2017-12-23 MED ORDER — NABUMETONE 500 MG PO TABS: 500.0000 mg | ORAL_TABLET | Freq: Two times a day (BID) | ORAL | 0 refills | Status: DC

## 2017-12-23 NOTE — Telephone Encounter (Signed)
Patient presented in office for labs and requested a refill of nabumetone.   Last visit: 11/04/2017 Next visit: 02/04/2018 Labs: 12/23/2017 (pending)  Okay to refill  Nabumetone?

## 2017-12-24 ENCOUNTER — Telehealth: Payer: Self-pay | Admitting: Pulmonary Disease

## 2017-12-24 DIAGNOSIS — Z0000 Encounter for general adult medical examination without abnormal findings: Secondary | ICD-10-CM

## 2017-12-24 DIAGNOSIS — I5022 Chronic systolic (congestive) heart failure: Secondary | ICD-10-CM

## 2017-12-24 DIAGNOSIS — E663 Overweight: Secondary | ICD-10-CM

## 2017-12-24 DIAGNOSIS — E291 Testicular hypofunction: Secondary | ICD-10-CM | POA: Diagnosis not present

## 2017-12-24 DIAGNOSIS — I10 Essential (primary) hypertension: Secondary | ICD-10-CM

## 2017-12-24 DIAGNOSIS — R7309 Other abnormal glucose: Secondary | ICD-10-CM

## 2017-12-24 LAB — CBC WITH DIFFERENTIAL/PLATELET
BASOS ABS: 48 {cells}/uL (ref 0–200)
Basophils Relative: 0.9 %
Eosinophils Absolute: 201 cells/uL (ref 15–500)
Eosinophils Relative: 3.8 %
HEMATOCRIT: 43 % (ref 38.5–50.0)
Hemoglobin: 14.4 g/dL (ref 13.2–17.1)
LYMPHS ABS: 1102 {cells}/uL (ref 850–3900)
MCH: 30.3 pg (ref 27.0–33.0)
MCHC: 33.5 g/dL (ref 32.0–36.0)
MCV: 90.5 fL (ref 80.0–100.0)
MPV: 10.4 fL (ref 7.5–12.5)
Monocytes Relative: 11.4 %
NEUTROS PCT: 63.1 %
Neutro Abs: 3344 cells/uL (ref 1500–7800)
Platelets: 226 10*3/uL (ref 140–400)
RBC: 4.75 10*6/uL (ref 4.20–5.80)
RDW: 12.9 % (ref 11.0–15.0)
Total Lymphocyte: 20.8 %
WBC: 5.3 10*3/uL (ref 3.8–10.8)
WBCMIX: 604 {cells}/uL (ref 200–950)

## 2017-12-24 LAB — COMPLETE METABOLIC PANEL WITH GFR
AG RATIO: 2 (calc) (ref 1.0–2.5)
ALKALINE PHOSPHATASE (APISO): 67 U/L (ref 40–115)
ALT: 19 U/L (ref 9–46)
AST: 22 U/L (ref 10–35)
Albumin: 4.2 g/dL (ref 3.6–5.1)
BILIRUBIN TOTAL: 0.7 mg/dL (ref 0.2–1.2)
BUN: 18 mg/dL (ref 7–25)
CHLORIDE: 107 mmol/L (ref 98–110)
CO2: 23 mmol/L (ref 20–32)
Calcium: 9.6 mg/dL (ref 8.6–10.3)
Creat: 0.98 mg/dL (ref 0.70–1.18)
GFR, Est African American: 88 mL/min/{1.73_m2} (ref >=60)
GFR, Est Non African American: 76 mL/min/{1.73_m2} (ref >=60)
GLOBULIN: 2.1 g/dL (ref 1.9–3.7)
Glucose, Bld: 129 mg/dL — ABNORMAL HIGH (ref 65–99)
POTASSIUM: 4.2 mmol/L (ref 3.5–5.3)
SODIUM: 140 mmol/L (ref 135–146)
Total Protein: 6.3 g/dL (ref 6.1–8.1)

## 2017-12-24 NOTE — Telephone Encounter (Signed)
Spoke with patient. He has scheduled his physical for 01/07/18 at 1130am with Dr. Lenna Gilford. Patient is requesting to have standing labs ordered so that he can stop by prior to his appointment. Patient is concerned about his glucose because Dr.  Estanislado Pandy checked it last week (patient admits he was advised to not fast) and it was 135.   Dr. Lenna Gilford, please advise if you are ok with [place] ordering labs for him. Thanks!

## 2017-12-25 NOTE — Telephone Encounter (Signed)
Per SN- ok for labs BMP,CBC, A1C,BNP,TSH,and PSA.  Orders placed and Patient notified. Nothing further at this time.

## 2017-12-31 ENCOUNTER — Other Ambulatory Visit (INDEPENDENT_AMBULATORY_CARE_PROVIDER_SITE_OTHER): Payer: Medicare Other

## 2017-12-31 DIAGNOSIS — R7309 Other abnormal glucose: Secondary | ICD-10-CM

## 2017-12-31 DIAGNOSIS — E663 Overweight: Secondary | ICD-10-CM | POA: Diagnosis not present

## 2017-12-31 DIAGNOSIS — Z0000 Encounter for general adult medical examination without abnormal findings: Secondary | ICD-10-CM

## 2017-12-31 DIAGNOSIS — I5022 Chronic systolic (congestive) heart failure: Secondary | ICD-10-CM | POA: Diagnosis not present

## 2017-12-31 LAB — BASIC METABOLIC PANEL
BUN: 21 mg/dL (ref 6–23)
CHLORIDE: 105 meq/L (ref 96–112)
CO2: 25 mEq/L (ref 19–32)
Calcium: 9.9 mg/dL (ref 8.4–10.5)
Creatinine, Ser: 1.03 mg/dL (ref 0.40–1.50)
GFR: 75.07 mL/min (ref >60.00)
GLUCOSE: 130 mg/dL — AB (ref 70–99)
POTASSIUM: 4.4 meq/L (ref 3.5–5.1)
SODIUM: 140 meq/L (ref 135–145)

## 2017-12-31 LAB — TSH: TSH: 3.52 u[IU]/mL (ref 0.35–4.50)

## 2017-12-31 LAB — BRAIN NATRIURETIC PEPTIDE: Pro B Natriuretic peptide (BNP): 26 pg/mL (ref 0.0–100.0)

## 2017-12-31 LAB — PSA: PSA: 3.29 ng/mL (ref 0.10–4.00)

## 2017-12-31 LAB — HEMOGLOBIN A1C: Hgb A1c MFr Bld: 6 % (ref 4.6–6.5)

## 2018-01-01 IMAGING — DX DG CHEST 1V PORT
1 series · 1 of 1 positions shown · non-contrast
Comparison: Chest x-ray dated 10/23/2016.

CLINICAL DATA: Dyspnea/ICD placed today eval for pneumothorax

EXAM:
PORTABLE CHEST 1 VIEW

[chest ap]
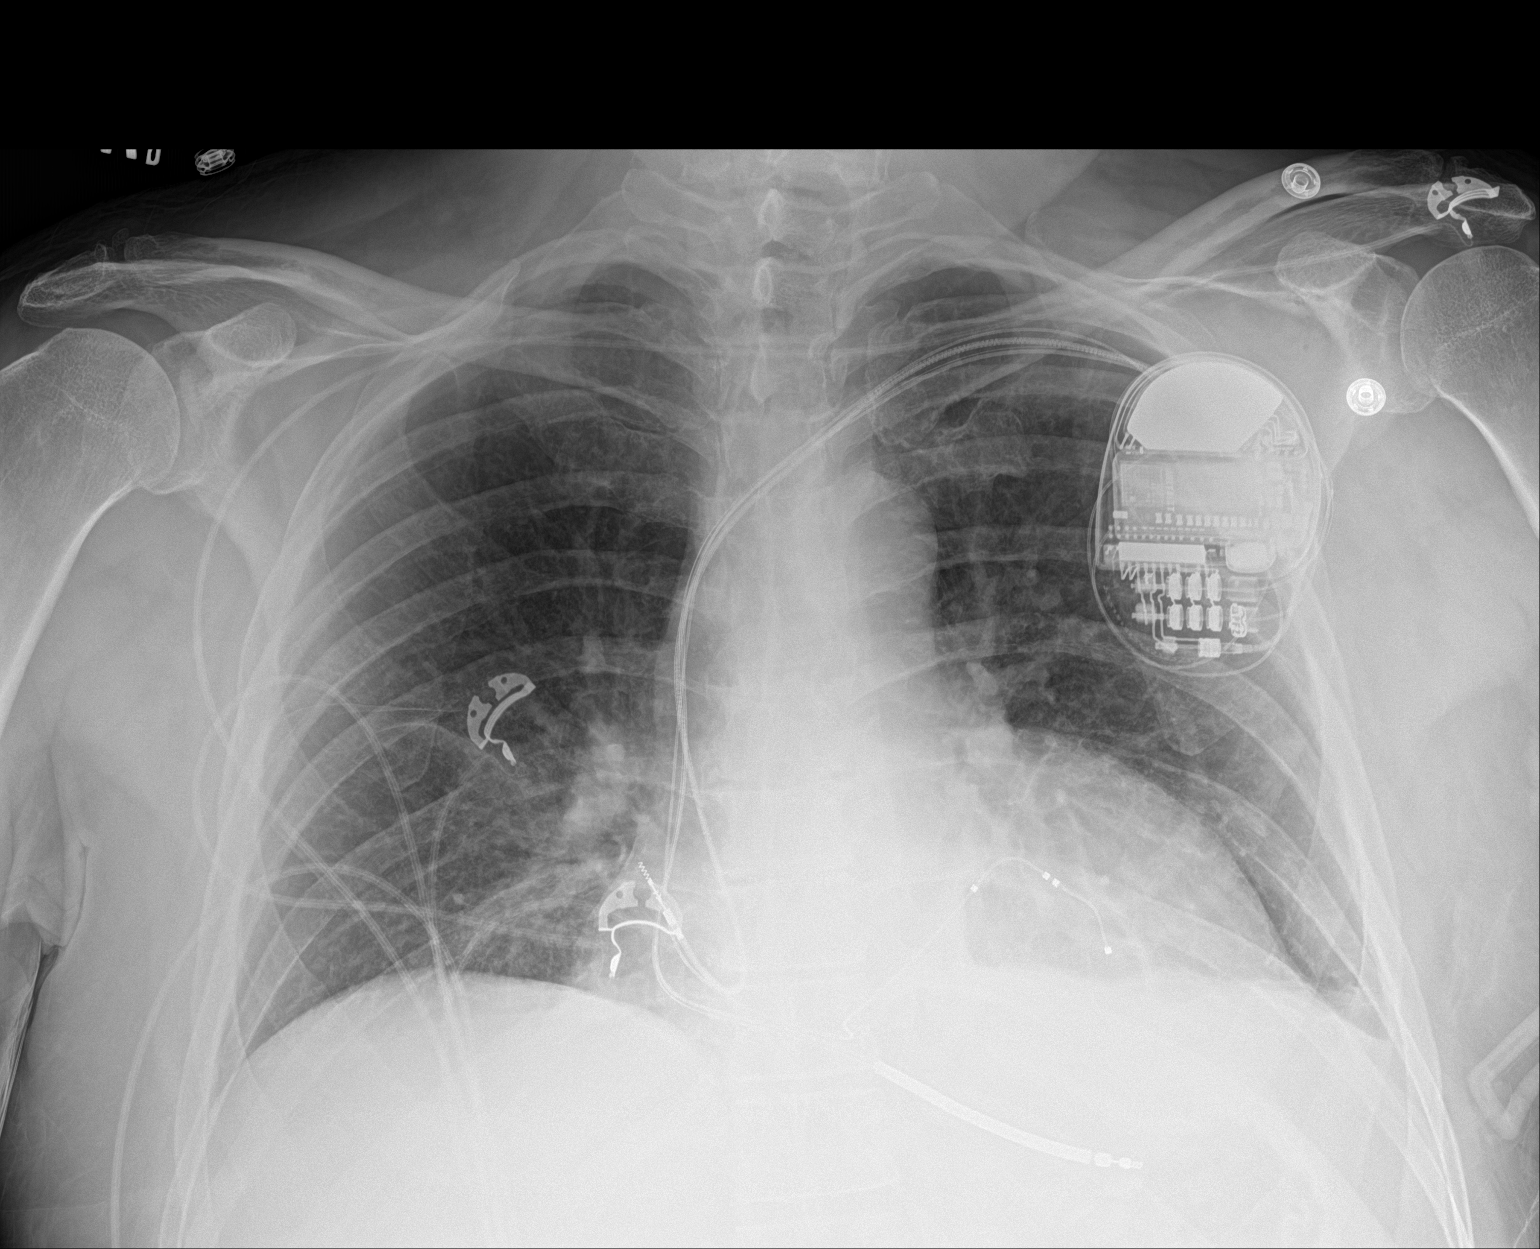

[1 of 1 positions shown; findings below may reference images not displayed]

FINDINGS: Left chest wall ICD now in place with appropriate appearance of the
lead positioning. No pneumothorax seen.

Study is hypoinspiratory with crowding of the perihilar and
bibasilar bronchovascular markings. Stable opacity at the left lung
base, compatible with the chronic lingular scarring/atelectasis
demonstrated on earlier chest CT. Given the low lung volumes, right
lung appears clear. Heart size is upper normal, stable.
IMPRESSION: No active disease.  Status post ICD placement.  No pneumothorax.

## 2018-01-02 ENCOUNTER — Encounter: Payer: Self-pay | Admitting: *Deleted

## 2018-01-02 ENCOUNTER — Ambulatory Visit (INDEPENDENT_AMBULATORY_CARE_PROVIDER_SITE_OTHER): Payer: Medicare Other | Admitting: Cardiovascular Disease

## 2018-01-02 ENCOUNTER — Encounter: Payer: Self-pay | Admitting: Cardiovascular Disease

## 2018-01-02 VITALS — BP 130/80 | HR 70 | Ht 68.0 in | Wt 186.0 lb

## 2018-01-02 DIAGNOSIS — K432 Incisional hernia without obstruction or gangrene: Secondary | ICD-10-CM | POA: Diagnosis not present

## 2018-01-02 DIAGNOSIS — I5022 Chronic systolic (congestive) heart failure: Secondary | ICD-10-CM

## 2018-01-02 DIAGNOSIS — I2510 Atherosclerotic heart disease of native coronary artery without angina pectoris: Secondary | ICD-10-CM | POA: Diagnosis not present

## 2018-01-02 DIAGNOSIS — R0789 Other chest pain: Secondary | ICD-10-CM

## 2018-01-02 DIAGNOSIS — I501 Left ventricular failure: Secondary | ICD-10-CM | POA: Diagnosis not present

## 2018-01-02 DIAGNOSIS — Z950 Presence of cardiac pacemaker: Secondary | ICD-10-CM | POA: Diagnosis not present

## 2018-01-02 DIAGNOSIS — G4733 Obstructive sleep apnea (adult) (pediatric): Secondary | ICD-10-CM | POA: Diagnosis not present

## 2018-01-02 IMAGING — DX DG CHEST 2V
2 series · 2 of 2 positions shown · non-contrast
Comparison: 03/27/2017

CLINICAL DATA: Recent defibrillator placement

EXAM:
CHEST  2 VIEW

[chest pa]
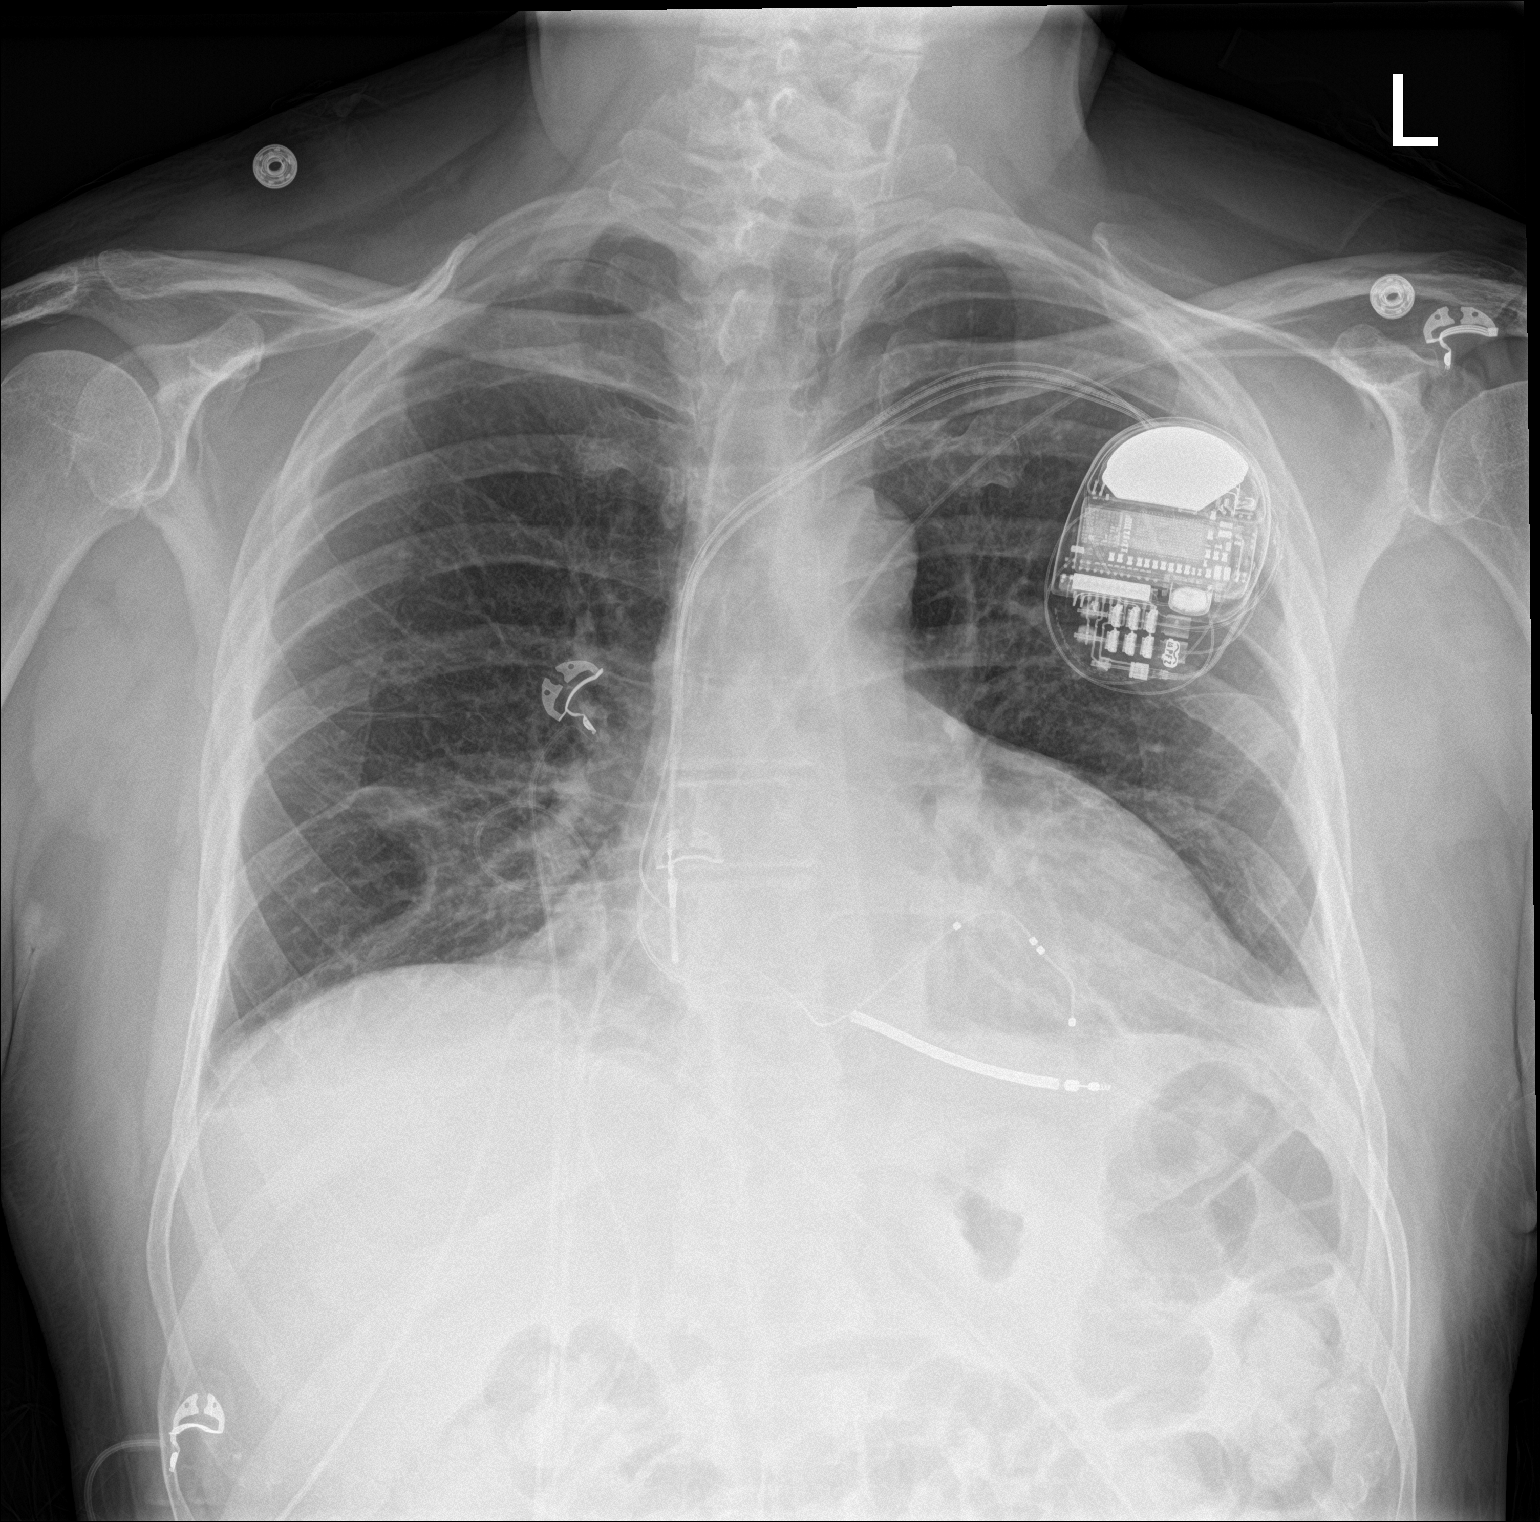

[chest lat]
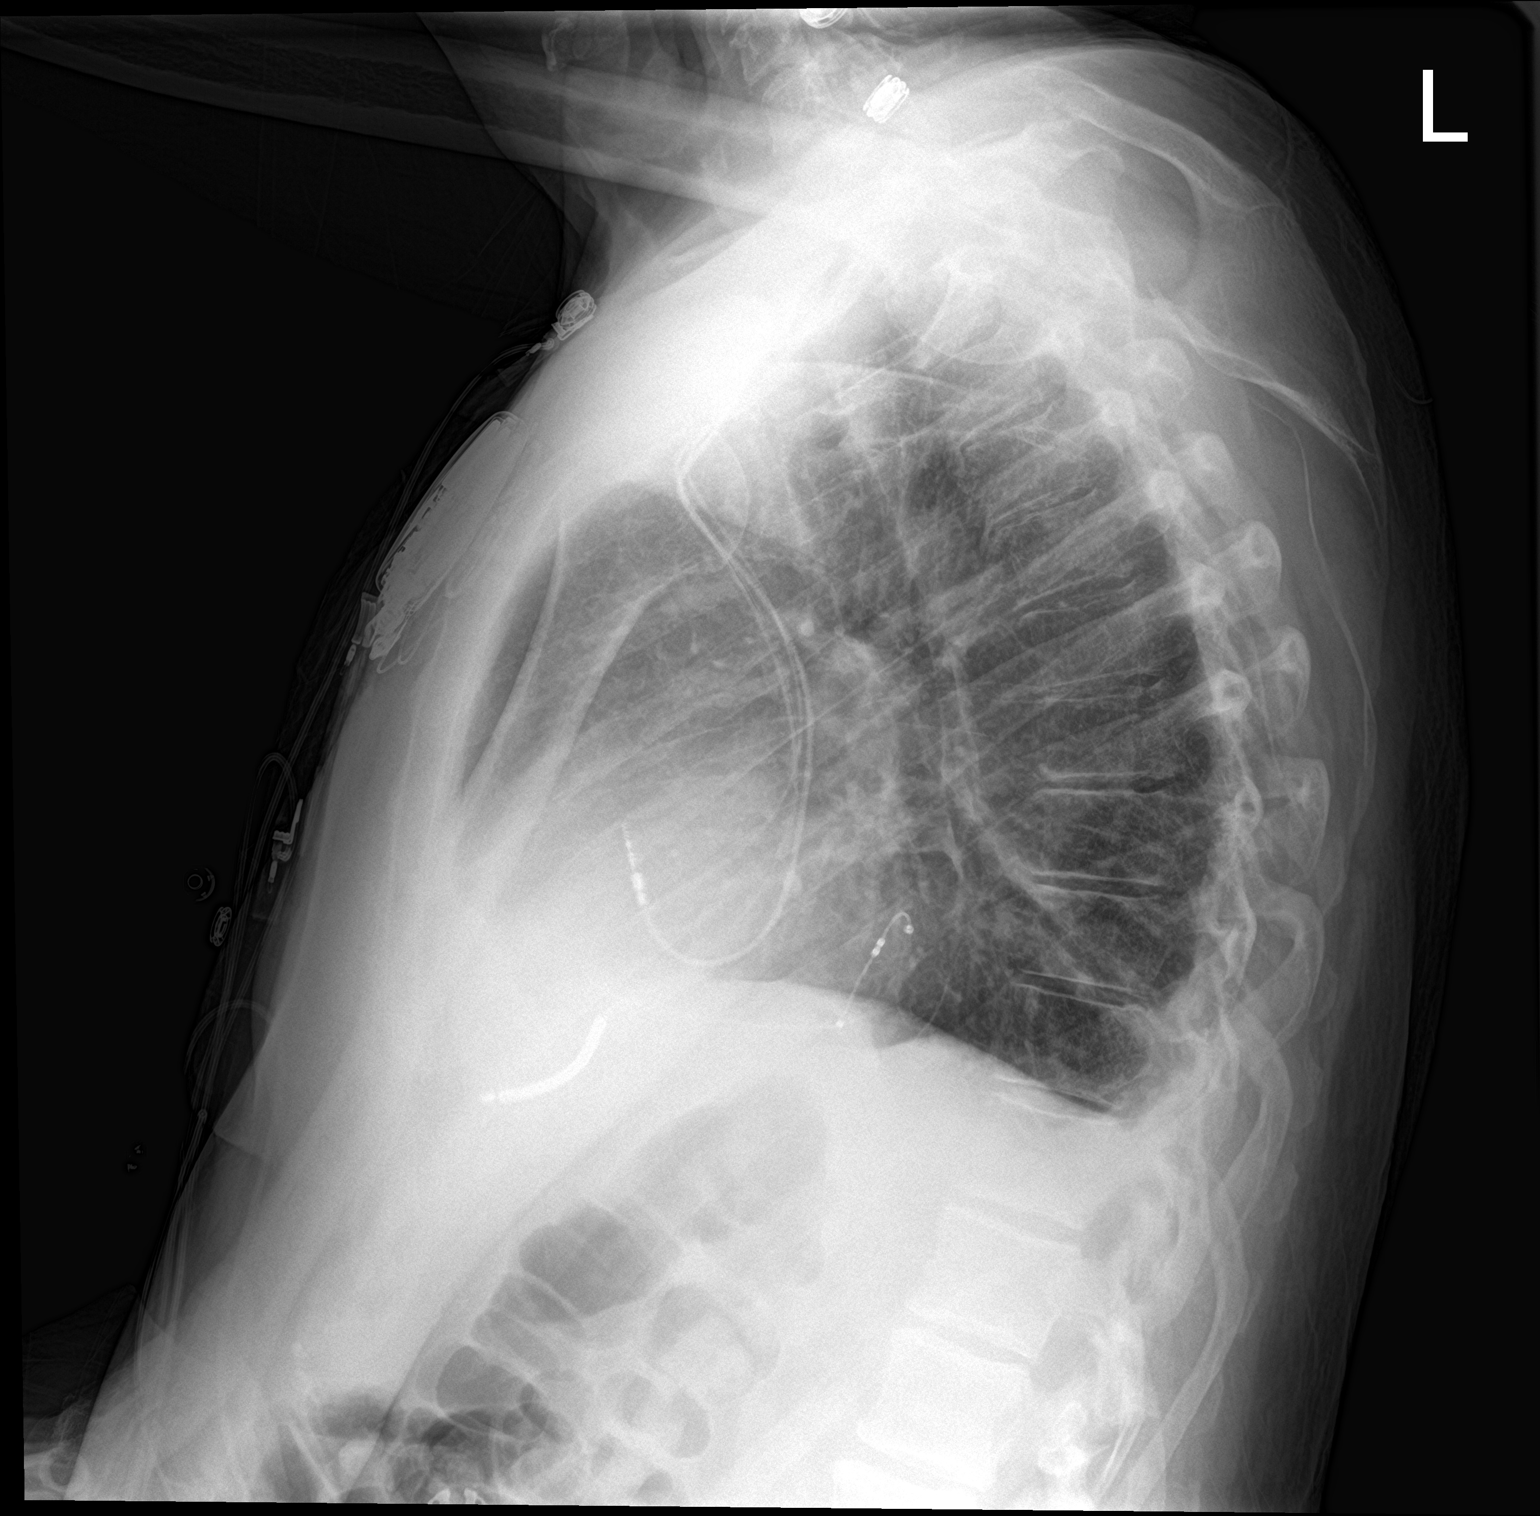

[2 of 2 positions shown; findings below may reference images not displayed]

FINDINGS: Defibrillator is again seen and relatively stable in appearance
given some patient rotation to the left. Cardiac shadow is stable.
No pneumothorax is seen. Mild bibasilar atelectasis is again noted
and stable. No acute bony abnormality is noted.
IMPRESSION: No evidence of pneumothorax.

No acute abnormality noted.  Stable bibasilar atelectasis is seen.

## 2018-01-02 NOTE — Patient Instructions (Signed)
Medication Instructions:  Your provider recommends that you continue on your current medications as directed. Please refer to the Current Medication list given to you today.    Labwork: None  Testing/Procedures: Dr. Burt Knack recommends you have a CHEST CT. Non-Cardiac CT scanning, (CAT scanning), is a noninvasive, special x-ray that produces cross-sectional images of the body using x-rays and a computer. CT scans help physicians diagnose and treat medical conditions. For some CT exams, a contrast material is used to enhance visibility in the area of the body being studied. CT scans provide greater clarity and reveal more details than regular x-ray exams.  Follow-Up: Your provider wants you to follow-up in: 6 months with Dr. Burt Knack. You will receive a reminder letter in the mail two months in advance. If you don't receive a letter, please call our office to schedule the follow-up appointment.    Any Other Special Instructions Will Be Listed Below (If Applicable).     If you need a refill on your cardiac medications before your next appointment, please call your pharmacy.

## 2018-01-02 NOTE — Telephone Encounter (Signed)
This encounter was created in error - please disregard.

## 2018-01-02 NOTE — Progress Notes (Signed)
Cardiology Office Note Date:  01/02/2018   ID:  [name], DOB 25-Apr-1946, MRN [id number]  PCP:  [name], MD  Cardiologist:  [name], MD    Chief Complaint  Patient presents with  . Fatigue     History of Present Illness: [name] is a 72 y.o. male who presents for follow-up evaluation.  He has a history of chronic systolic heart failure with left bundle branch block, coronary artery disease with remote stenting of the right coronary artery, and CRT-D implanted in November 2018.  He is here alone today. Just saw Dr Lucia Gaskins today and needs ventral hernia repair and pre-op CV clearance.   Today, he denies symptoms of palpitations, central chest pain, shortness of breath, orthopnea, PND, lower extremity edema, dizziness, or syncope. He complains of generalized fatigue since his carvedilol was increased in May for treatment of NSVT seen on device monitoring. His breathing has improved significantly since he underwent BiV pacemaker placement. He complains of right lateral chest/rib pain now going on for at least 2 months. It hurts with certain movements, is nonradiating, and he denies any significant trauma or injury.     Past Medical History:  Diagnosis Date  . Allergic rhinitis   . Allergy   . Anginal pain ([place]) 05/26/14   chest pain after chasing dog  . CAD (coronary artery disease)    hx of stent- 2005 RCA  . Cataract    beginning stage both eyes  . CHF (congestive heart failure) (HCC)    pacemaker [employer]    . Chronic back pain    intermittent  . Constipation   . Dizziness   . Dysrhythmia    right bundle branch block   . Esophageal stricture   . GERD (gastroesophageal reflux disease)   . Hemoptysis   . Hiatal hernia   . History of colonic polyps    hyperplastic  . HTN (hypertension)   . Hyperlipidemia   . Hypertrophy of prostate with urinary obstruction and other lower urinary tract symptoms (LUTS)   . OA (osteoarthritis)   . OSA  (obstructive sleep apnea)    cpap- 10   . 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 Pneumonia   . Shortness of breath    with exertion  . Sleep apnea    cpap  . Testicular hypofunction     Past Surgical History:  Procedure Laterality Date  . BIV ICD INSERTION CRT-D N/A 03/27/2017   Procedure: BIV ICD INSERTION CRT-D;  Surgeon: Constance Haw, MD;  Location: [facility];  Service: Cardiovascular;  Laterality: N/A;  . CARDIAC CATHETERIZATION     '05, last 2009, showing patent RCA stent  . COLONOSCOPY  12/2007   HYPERPLASTIC POLYP  . COLONOSCOPY WITH PROPOFOL N/A 06/02/2014   Procedure: COLONOSCOPY WITH PROPOFOL;  Surgeon: [name], MD;  Location: [facility];  Service: Endoscopy;  Laterality: N/A;  . CORONARY ANGIOPLASTY  08/2003  . ESOPHAGOGASTRODUODENOSCOPY (EGD) WITH PROPOFOL N/A 06/02/2014   Procedure: ESOPHAGOGASTRODUODENOSCOPY (EGD) WITH PROPOFOL;  Surgeon: [name], MD;  Location: [facility];  Service: Endoscopy;  Laterality: N/A;  . hernia surgery x 3     Bilateral Inguinal, Umbicial  . IRRIGATION AND DEBRIDEMENT ABSCESS Left 08/14/2012   Procedure: IRRIGATION AND DEBRIDEMENT LEFT INGUINAL BOIL ;  Surgeon: Ailene Rud, MD;  Location: [facility];  Service: Urology;  Laterality: Left;  .  JOINT REPLACEMENT Right 2017  . KNEE ARTHROPLASTY    . LEFT HEART CATHETERIZATION WITH CORONARY ANGIOGRAM N/A 05/26/2014   Procedure: LEFT HEART CATHETERIZATION WITH CORONARY ANGIOGRAM;  Surgeon: Blane Ohara, MD;  [facility];  Service: Cardiovascular;  Laterality: N/A;  . LUMBAR LAMINECTOMY/DECOMPRESSION MICRODISCECTOMY Left 09/26/2014   Procedure: Lumbar Laminectomy for resection of synovial cyst  Lumbar five- sacral one left;  Surgeon: Kary Kos, MD;  Location: [facility];  Service: Neurosurgery;  Laterality: Left;  . Oral surg to removed growth from ?sinus  10/2010    ?Dermoid removed by DrRiggs  . PACEMAKER INSERTION  04/03/2017  . POLYPECTOMY    . RCA stenting     '05 RCA  . right toe surgery  Right    Cyst   . RIGHT/LEFT HEART CATH AND CORONARY ANGIOGRAPHY N/A 01/15/2017   Procedure: RIGHT/LEFT HEART CATH AND CORONARY ANGIOGRAPHY;  Surgeon: [name], MD;  Location: [facility];  Service: Cardiovascular;  Laterality: N/A;  . s/p right knee arthroscopy  2005  . TOTAL KNEE ARTHROPLASTY Right 03/12/2016   Procedure: RIGHT TOTAL KNEE ARTHROPLASTY;  Surgeon: Paralee Cancel, MD;  Location: [facility];  Service: Orthopedics;  Laterality: Right;  . UPPER GASTROINTESTINAL ENDOSCOPY      Current Outpatient Medications  Medication Sig Dispense Refill  . aspirin EC 81 MG tablet Take 1 tablet (81 mg total) by mouth daily.    Marland Kitchen atorvastatin (LIPITOR) 20 MG tablet Take 20 mg by mouth daily.    Marland Kitchen azithromycin (ZITHROMAX) 250 MG tablet Take as directed (Patient taking differently: as needed. Take as directed) 6 tablet 0  . b complex vitamins tablet Take 1 tablet by mouth daily with lunch.     . calcium carbonate (OSCAL) 1500 (600 Ca) MG TABS tablet Take 600 mg by mouth every evening.    . carvedilol (COREG) 25 MG tablet Take 1 tablet (25 mg total) by mouth 2 (two) times daily. 180 tablet 2  . CINNAMON PO Take 1 tablet by mouth daily with lunch.     . Coenzyme Q10 (CO Q 10) 100 MG CAPS Take 1 capsule by mouth daily with lunch.     . Cream Base (PCCA LIPODERM BASE) CREA Apply 4 mLs topically daily.    . diclofenac sodium (VOLTAREN) 1 % GEL APPLY 2 GRAMS TO AFFECTED AREA 2 TO 4 TIMES DAILY AS NEEDED FOR PAIN.  3  . folic acid (FOLVITE) 361 MCG tablet Take 400 mcg by mouth daily.     . Glucosamine HCl (GLUCOSAMINE PO) Take 1,500 mg by mouth every evening.    . hydroxychloroquine (PLAQUENIL) 200 MG tablet Take one tablet (200 mg) twice daily Monday through Friday only. Do not take Saturday or Sundays. 120 tablet 0  . hydroxypropyl methylcellulose / hypromellose  (ISOPTO TEARS / GONIOVISC) 2.5 % ophthalmic solution Place 1 drop into both eyes 4 (four) times daily as needed for dry eyes.     Marland Kitchen MAGNESIUM PO Take 1 tablet by mouth daily as needed (for cramping).     . Multiple Vitamin (MULTIVITAMIN) capsule Take 1 capsule by mouth daily.      . Multiple Vitamins-Minerals (OCUVITE PO) Take 1 tablet by mouth at bedtime.    . nabumetone (RELAFEN) 500 MG tablet Take 1 tablet (500 mg total) by mouth 2 (two) times daily. 180 tablet 0  . NON FORMULARY CPAP machine with sleep.    . Omega-3 Fatty Acids (FISH OIL) 500 MG CAPS Take 500  mg by mouth at bedtime.     . pantoprazole (PROTONIX) 40 MG tablet TAKE 1 TABLET BY MOUTH EVERY DAY BEFORE BREAKFAST GENERIC EQUIVALENT FOR PROTONIX 90 tablet 3  . Probiotic Product (PROBIOTIC DAILY PO) Take 1 capsule by mouth daily with lunch.     . sacubitril-valsartan (ENTRESTO) 49-51 MG Take 1 tablet by mouth 2 (two) times daily. 180 tablet 3  . Saw Palmetto, Serenoa repens, (SAW PALMETTO PO) Take 1 capsule by mouth every evening.    Marland Kitchen spironolactone (ALDACTONE) 25 MG tablet TAKE 1/2 TABLET(12.5 MG) BY MOUTH DAILY 15 tablet 9  . tamsulosin (FLOMAX) 0.4 MG CAPS capsule Take 0.4 mg by mouth at bedtime.    . traMADol (ULTRAM) 50 MG tablet Take 50 mg by mouth daily with lunch.     . TURMERIC PO Take 1 tablet by mouth daily with lunch.      Current Facility-Administered Medications  Medication Dose Route Frequency Provider Last Rate Last Dose  . 0.9 %  sodium chloride infusion  500 mL Intravenous Once [name], MD        Allergies:   Sulfonamide derivatives   Social History:  The patient  reports that he quit smoking about 27 years ago. He has never used smokeless tobacco. He reports that he drinks alcohol. He reports that he does not use drugs.   Family History:  The patient's family history includes Arthritis in his brother and daughter; COPD in his brother; Cancer in his brother; Coronary artery disease in his father;  Diabetes in his father; Heart disease in his father and mother; Heart failure in his brother; Hypertension in his unknown relative; Stomach cancer in his paternal grandmother; Sudden death in his father.    ROS:  Please see the history of present illness.  Otherwise, review of systems is positive for fatigue, snoring, right lateral rib/chest discomfort with movement.  All other systems are reviewed and negative.    PHYSICAL EXAM: VS:  BP 130/80   Pulse 70   Ht 5\' 8"  (1.727 m)   Wt 186 lb (84.4 kg)   SpO2 97%   BMI 28.28 kg/m  , BMI Body mass index is 28.28 kg/m. GEN: Well nourished, well developed, in no acute distress  HEENT: normal  Neck: no JVD, no masses. No carotid bruits Cardiac: RRR without murmur or gallop         Chest: tender to palpation over right lateral chest without deformity Respiratory:  clear to auscultation bilaterally, normal work of breathing GI: soft, nontender, nondistended, + BS MS: no deformity or atrophy  Ext: no pretibial edema, pedal pulses 2+= bilaterally Skin: warm and dry, no rash Neuro:  Strength and sensation are intact Psych: euthymic mood, full affect  EKG:  EKG is not ordered today.  Recent Labs: 12/23/2017: ALT 19; Hemoglobin 14.4; Platelets 226 12/31/2017: BUN 21; Creatinine, Ser 1.03; Potassium 4.4; Pro B Natriuretic peptide (BNP) 26.0; Sodium 140; TSH 3.52   Lipid Panel     Component Value Date/Time   CHOL 138 11/27/2016 0914   TRIG 188 (H) 11/27/2016 0914   HDL 35 (L) 11/27/2016 0914   CHOLHDL 3.9 11/27/2016 0914   CHOLHDL 4 12/07/2015 0742   VLDL 30.6 12/07/2015 0742   LDLCALC 65 11/27/2016 0914      Wt Readings from Last 3 Encounters:  01/02/18 186 lb (84.4 kg)  11/11/17 184 lb 6.4 oz (83.6 kg)  11/04/17 187 lb (84.8 kg)     Cardiac Studies Reviewed: Cardiac  Cath 01-15-2018: Conclusion   1. Widely patent coronary arteries with continued patency of the stented segment in the right coronary artery 2. Mild nonobstructive  coronary artery disease as detailed with mild stenosis of the proximal to mid LAD and otherwise minimal luminal irregularities 3. Well compensated right-sided cardiac hemodynamics  Recommendation: Aggressive medical therapy. Refer to EP as an outpatient for consideration of CRT-D.   Indications   Chronic systolic heart failure (HCC) [I50.22 (ICD-10-CM)]  Procedural Details/Technique   Technical Details INDICATION: CHF, severe LV dysfunction (progressive)  PROCEDURAL DETAILS: There was an indwelling IV in a right antecubital vein. Using normal sterile technique, the IV was changed out for a 5 Fr brachial sheath over a 0.018 inch wire. The right wrist was then prepped, draped, and anesthetized with 1% lidocaine. Using the modified Seldinger technique a 5/6 French Slender sheath was placed in the right radial artery. Intra-arterial verapamil was administered through the radial artery sheath. IV heparin was administered after a JR4 catheter was advanced into the central aorta. A Swan-Ganz catheter was used for the right heart catheterization. Standard protocol was followed for recording of right heart pressures and sampling of oxygen saturations. Fick cardiac output was calculated. Standard Judkins catheters were used for selective coronary angiography. The JR4 was used to record LV pressure and pullback across the aortic valve. There were no immediate procedural complications. The patient was transferred to the post catheterization recovery area for further monitoring.     Estimated blood loss <50 mL.  During this procedure the patient was administered the following to achieve and maintain moderate conscious sedation: Versed 3 mg, Fentanyl 50 mcg, while the patient's heart rate, blood pressure, and oxygen saturation were continuously monitored. The period of conscious sedation was 29 minutes, of which I was present face-to-face 100% of this time.  Coronary Findings   Diagnostic  Dominance: Right    Left Anterior Descending  The vessel exhibits minimal luminal irregularities.  Mid LAD lesion 30% stenosed  Mid LAD lesion.  Left Circumflex  The vessel exhibits minimal luminal irregularities.  Right Coronary Artery  The vessel exhibits minimal luminal irregularities.  Mid RCA lesion 0% stenosed  Previously placed Mid RCA drug eluting stent is widely patent.  Intervention   No interventions have been documented.  Coronary Diagrams   Diagnostic Diagram        Echo 06-25-2017: Study Conclusions  - Left ventricle: The cavity size was normal. Systolic function was   normal. The estimated ejection fraction was in the range of 55%   to 60%. Wall motion was normal; there were no regional wall   motion abnormalities. Doppler parameters are consistent with   abnormal left ventricular relaxation (grade 1 diastolic   dysfunction). - Ventricular septum: Septal motion showed dyssynergy. These   changes are consistent with a left bundle branch block. - Aortic valve: There was trivial regurgitation. - Atrial septum: There was increased thickness of the septum,   consistent with lipomatous hypertrophy.  ASSESSMENT AND PLAN: 1.  CAD, native vessel, no angina: recent cath with widely patent coronaries.  Overall appears very stable.  He continues on aspirin for antiplatelet therapy, a beta-blocker, and a statin drug.  2. Chronic systolic CHF: euvolemic, LV function normalized with CRT. Continue carvedilol and Entresto.  Also treated with Spironolactone.  Most recent labs are reviewed.  3. NSVT: treated with beta blocker, ICD in place, asymptomatic  4. Preoperative surgical clearance: The patient's functional status is greatly improved since he underwent biventricular pacemaker placement.  His LV function has normalized.  He had patent coronary arteries at his most recent catheterization.  I think he can proceed with surgery at low risk of cardiac complications.  5.  Chest wall pain: The  patient has had about 2 months of chest wall pain along the right lateral chest wall.  He is tender to palpation on exam.  There is been no trauma.  He does not feel like this is related to a muscle pull.  I recommended a noncontrasted chest CT to evaluate further.  Current medicines are reviewed with the patient today.  The patient does not have concerns regarding medicines.  Labs/ tests ordered today include:   Orders Placed This Encounter  Procedures  . CT Chest Wo Contrast    Disposition:   FU 6 months  Signed, [name], MD  01/02/2018 6:39 PM    [facility] [address], [place], [place]  [address] Phone: [phone]; Fax: [phone]

## 2018-01-05 ENCOUNTER — Ambulatory Visit (INDEPENDENT_AMBULATORY_CARE_PROVIDER_SITE_OTHER)
Admission: RE | Admit: 2018-01-05 | Discharge: 2018-01-05 | Disposition: A | Discharge status: Home / Self Care | Payer: Medicare Other | Source: Ambulatory Visit | Attending: Cardiovascular Disease | Admitting: Cardiovascular Disease

## 2018-01-05 DIAGNOSIS — R0789 Other chest pain: Secondary | ICD-10-CM | POA: Diagnosis not present

## 2018-01-06 ENCOUNTER — Ambulatory Visit: Payer: Self-pay | Admitting: Surgery

## 2018-01-07 ENCOUNTER — Encounter: Payer: Self-pay | Admitting: Pulmonary Disease

## 2018-01-07 ENCOUNTER — Ambulatory Visit (INDEPENDENT_AMBULATORY_CARE_PROVIDER_SITE_OTHER): Payer: Medicare Other | Admitting: Pulmonary Disease

## 2018-01-07 ENCOUNTER — Ambulatory Visit (INDEPENDENT_AMBULATORY_CARE_PROVIDER_SITE_OTHER): Payer: Medicare Other | Admitting: *Deleted

## 2018-01-07 VITALS — BP 122/64 | HR 80 | Temp 98.1°F | Ht 68.0 in | Wt 186.0 lb

## 2018-01-07 DIAGNOSIS — G4733 Obstructive sleep apnea (adult) (pediatric): Secondary | ICD-10-CM

## 2018-01-07 DIAGNOSIS — I10 Essential (primary) hypertension: Secondary | ICD-10-CM

## 2018-01-07 DIAGNOSIS — M150 Primary generalized (osteo)arthritis: Secondary | ICD-10-CM | POA: Diagnosis not present

## 2018-01-07 DIAGNOSIS — Z96651 Presence of right artificial knee joint: Secondary | ICD-10-CM

## 2018-01-07 DIAGNOSIS — R0609 Other forms of dyspnea: Secondary | ICD-10-CM

## 2018-01-07 DIAGNOSIS — I428 Other cardiomyopathies: Secondary | ICD-10-CM

## 2018-01-07 DIAGNOSIS — I5022 Chronic systolic (congestive) heart failure: Secondary | ICD-10-CM | POA: Diagnosis not present

## 2018-01-07 DIAGNOSIS — M159 Polyosteoarthritis, unspecified: Secondary | ICD-10-CM

## 2018-01-07 DIAGNOSIS — I2510 Atherosclerotic heart disease of native coronary artery without angina pectoris: Secondary | ICD-10-CM | POA: Diagnosis not present

## 2018-01-07 DIAGNOSIS — Z8701 Personal history of pneumonia (recurrent): Secondary | ICD-10-CM | POA: Diagnosis not present

## 2018-01-07 NOTE — Progress Notes (Signed)
Remote ICD transmission.   

## 2018-01-09 ENCOUNTER — Encounter: Payer: Self-pay | Admitting: Pulmonary Disease

## 2018-01-09 ENCOUNTER — Encounter: Payer: Self-pay | Admitting: Cardiology

## 2018-01-09 NOTE — Patient Instructions (Signed)
Today we updated your med list in our EPIC system...    Continue your current medications the same...  We reviewed your interval medical hx over the past year...  We reviewed your recent blood work and CXRs/ CT scan...  We will arrange for a new CPAP machine thru [facility]- as discussed...   Firas, it has been my great honor to have been one of your doctor's over these many years.  Your family has meant a lot to me, and I wish you and yours best wishes and good health in the future!

## 2018-01-09 NOTE — Progress Notes (Addendum)
Subjective:    Patient ID: [name], male    DOB: 08-28-1945, 72 y.o.   MRN: [id number]  HPI 72 y/o WM here for a follow up visit... he has multiple medical problems as noted below...   ~  SEE PREV EPIC NOTES FOR OLDER DATA >>    ~  June 18, 2010:  He's had a good 61mo> still c/o bilat CTS symtpms in the AM & he will try the wrist splints Qhs...  he was started on AXIRON topical testosterone Rx per DrTannenbaum & improved...  ~  January 07, 2011:  He continues to f/u w/ DrCooper for Cards every 23mo- seen last wk> CAD, s/p stent RCA 2005, LBBB; last cath was 2009 w/ patent RCA stent & mild non-obstructive dis elsewhere; doing well, exercising regularly, no CP/ palpit/ SOB/ edema/ etc; rec to continue ASA/ Plavix,   LABS 2/13:  FLP- elevTG & lowHDL on Cres10;  Chems- wnl;  CBC- wnl;  TSH=1.91;  PSA=2.08;  VitD=58;  UA- clear  LABS 7/13 by DrDeveshwar:  Chems- wnl x BS=109;  CBC- wnl   ~  November 29, 2014:  3 year ROV & Mutasim returns after a long hiatus, expertly cared for in the interval by DrCooper for Cards, DrCram for NS, DrDeveshwar for Rheum, DrTannenbaum for Urology, and DrCornett for CCS... He tells me that he needs 2 knees, had lumbar lam by DrCram about 38mo ago, and he wants to see ENT due to losing his voice- voice getting weaker assoc w/ dry cough, denies sput, c/o mild SOB "it's my heart"    OSA> stable on CPAP w/ supplies Q54mo from [place]; no issues w/ his machine or interface; rests well, wakes refreshed, no daytime hypersomnolence & no prob w/ alertness...    HBP & CAD, s/p stent in RCA 2005, LBBB, decr EF w/ septal dysynergy, chr dyspnea> followed by DrCooper on ASA/ Plavix/ Coreg12.5Bid/ Cozaar100; BP= 142/72 & he denies CP, palpit, dizzy, SOB, edema; last saw DrCooper 07/2014 & his note is reviewed    CHOL> on Lip20, FishOil, CoQ10; FLP 9/15 showed TChol 125, TG 154, HDL 30, LDL 64; needs better low fat diet...    GI> HH, Hx stricture, antral nodule=adenoma> on Protonix40  followed by Ardis Hughs (see below); colonoscopy 1/16 was neg/ wnl...    GU> BPH, LTOS, Low-T> on Flomax0.4 & Androgel per DrTannenbaum; he relates a story about GU symptoms & extensive work up in their office "doing just fine" he says, incr energy, etc; treated for seminal vesiculitis w/ Doxy in the past; last seen 3/16 and note reviewed...     Ortho> DJD, LBP> prev on Celebrex & switched to Etodolac & now Percocet & Tramadol; he notes that he's been hurting since he was 71 y/o w/ a strong fam hx arthritis; he saw DrDeveshwar for Rheum & DrCram for NS- s/p Lumbar decompression for resection of synovial cyst & large bone spur L5-S1    Derm> Hx skin cancers treated by DrDJones... EXAM showed Afeb, VSS, O2sat=96% on RA;  HEENT- neg, mallampati1;  Chest- clear w/o w/r/r;  Heart- RR gr1/6 SEM S4 w/o rubs;  Abd- soft, nontender, neg;  Ext- neg w/o c/c/e;  Neuro- intact... We reviewed prob list, meds, xrays and labs> see below for updates >> given TDAP & Prevnar-13, he reports that he got the Pneumovax23 at health dept in 2009...  2DEcho 07/22/11 showed mild LVH, & decr LVF w/ EF~30% w/ HK, Gr1 DD, mild MR, mild LAdil, .Marland Kitchen  CXR 08/13/12 showed norm heart size, clear lungs, NAD.Marland KitchenMarland Kitchen   EKG 05/24/14 showed NSR, rate76, LBBB, no acute changes...  CATH [id number] showed patent coronaries and open stent in RCA, nonobstructive dis in LAD, mild to mod LVD (EF=50%) w/ norm LVEDP- medical management.  Spirometry 11/29/14 showed FVC=2.84 (67%), FEV1=2.05 (63%), %1sec=72, mid-flows reduced at 46% predicted; c/w mild airflow obstruction but can't r/o superimposed restriction w/o lung volumes, etc...   LABS 5/16 showed Chems- wnl & CBC- wnl IMP/PLAN>>  A lot to get caught up on after a 3 yr hiatus;  meds refilled, he wants ZPak for prn use, TDAP & Prevnar-13 today, refer to ENT for check due to losing his voice...  ~  December 06, 2015:  Yearly [place] f/u visit>  Jearld reports an number of things that have occurred over the past  yr:  1) root canal done yest, on ?Clinda300tid (didn't bring meds or list to todays visit) & reminded to take Probiotic daily + Activia yogurt;  2) has "stress fx" R knee, eval by DrOlin- wearing brace & has cane;  3) Back surg 5/16 by DrCram w/ "cyst" removed;  4) he says XRays and MRIs have shown that he is "eaten up w/ arthritis" and there is nothing else that they can do; he sees Rheum- DrDeveshwar & she will check him before he travels & give him a shot... 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    HBP & CAD, s/p stent in RCA 2005, LBBB, decr EF 35-40% w/ septal dysynergy, chr dyspnea> followed by DrCooper off Plavix, on ASA/ Coreg12.5Bid/ Cozaar100; BP= 128/72 & he denies CP, palpit, dizzy, SOB, edema; last saw DrCooper 6/17 & his note is reviewed- HBP, CAD, s/p PCI to RCA, LBBB, no recurrent ischemia (cath 1/16 w/ patent stent) & he has chr dyspnea; lim in exerc by his knee prob; he worries a lot about statin related side effects.    CHOL> on Lip20, FishOil, CoQ10; FLP 10/16 showed TChol 121, TG 160, HDL 32, LDL 58; needs better low fat diet...    GI> HH, Hx stricture, antral nodule=adenoma> on Protonix40 followed by DrJacobs (see below); EGD 1/16 showed 3cmHH & schatzki's ring, duodenal adenoma resected & a subepithelial antral lesion; colonoscopy 1/16 was neg/ wnl...    GU> BPH, LTOS, Low-T> on Flomax0.4 & Androgel per DrTannenbaum; he relates a story about GU symptoms & extensive work up in their office "doing just fine" he says, incr energy, etc; treated for seminal vesiculitis w/ Doxy in the past; last seen 6/17 and  note reviewed- LTOS & Low-T, on Flomax & Androgel...    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 EXAM showed Afeb, VSS, O2sat=96% on RA;  HEENT- neg, mallampati1;  Chest- clear w/o w/r/r;  Heart- RR gr1/6 SEM S4 w/o rubs;  Abd- soft, nontender, neg;  Ext- neg w/o c/c/e;  Neuro- intact...  CXR 12/06/15> heart at upper lim of norm, lungs essentially clear w/ some scarring on left, NAD...  FASTING blood work> he never returned for FASTING labs as requested... IMP/PLAN>>  Clark appears stable w/ his medical care distributed among his specialists- DrCooper, DrJacobs, DrTannenbaum, DrDeveshwar, DrCram; continue present meds...   ~  April 15, 2016:  75mo [place] saw SG 3wks ago w/ nonprod cough, low grade temp~100, weakness; he had R-TKR 03/12/16 by DrOlin & post op rehab has been rough- on musc relaxer & Norco, Celebrex added & using a walker;  Exam was neg- min leg swelling, chest clear, VSS;  He was given a ZPak & checked VenDopplers- neg, no DVT;  He is here for f/u today- still in lots of pain from the knee surg but getting better slowly he says & the ZPak really helped overall;  He notes that he may need his left knee done later! EXAM showed Afeb, VSS, O2sat=97% on RA;  HEENT- neg, mallampati1;  Chest- clear w/o w/r/r;  Heart- RR gr1/6 SEM S4 w/o rubs;  Abd- soft, nontender, neg;  Ext- s/p R-TKR, mild edema, no c/c;  Neuro- intact...  CXR 03/27/16 (independently reviewed by me in the PACS system)>  Borderline heart size w/o edema, mild basilar atx vs scarring, NAD.Marland KitchenMarland Kitchen   Ven Dopplers 03/28/16>  RLE neg for DVT, pos for baker's cyst...  LABS 02/2016 in Epic>  Chems- wnl x BS=130 range;  CBC- mild anemia post op  w/ Hg=11.2, mcv=90;  Blood type= Opos IMP/PLAN>>  Tierre is slowly improving- URI resolved, getting better w/ exercise/ actiity/ to start PT etc;  OK 2017 Flu vaccine today & we'll plan ROV ~46mo...  ~  May 27, 2016:  6wk ROV & add-on appt requested for hemoptysis>  Ormand says he developed a cough around Christmas week, deep cough, small amt green sput,  Low grade fever w/o c/s, sl incr SOB, no CP etc;  DrByrum called in a ZPak & the sput seemed to clear, then 3-4d ago he noted small amt streaky hemoptysis but SOB was better & still no discomfort noted;  He feels weak, had Cards f/u DrCooper & doing satis- told to call us for f/u appt;      OSA> stable on CPAP w/ supplies Q27mo from [place]; no issues w/ his machine or interface; rests well, wakes refreshed, no daytime hypersomnolence & no prob w/ alertness; he had ENT eval 10/16 by DrByers for hoarseness & losing his voice, exam was NEG, 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    HBP & CAD, s/p stent in RCA 2005, LBBB, decr EF 35-40% w/ septal dysynergy, chr dyspnea> followed by DrCooper off Plavix, on ASA/ Coreg12.5Bid/ Cozaar100; BP= 128/72 & he denies CP, palpit, dizzy, SOB, edema; last saw DrCooper 6/17 & his note is reviewed- HBP, CAD, s/p PCI to RCA, LBBB, no recurrent ischemia (cath 1/16 w/ patent stent) & he has chr dyspnea; lim in exerc by his knee prob; he worries a lot about statin related side effects.    CHOL> on Lip20, FishOil, CoQ10; FLP 7/17 showed TChol 134, TG 153, HDL 32, LDL 72; needs better low fat diet...    GI> HH, Hx stricture, antral nodule=adenoma> on Protonix40 followed by DrJacobs (see below); EGD 1/16 showed 3cmHH & schatzki's ring, duodenal  adenoma resected & a subepithelial antral lesion; colonoscopy 1/16 was neg/ wnl...    GU> BPH, LTOS, Low-T> on Flomax0.4 & Androgel per DrTannenbaum; he relates a story about GU symptoms & extensive work up in their office "doing  just fine" he says, incr energy, etc; treated for seminal vesiculitis w/ Doxy in the past; last seen 6/17 and note reviewed- LTOS & Low-T, on Flomax & Androgel...    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    Derm> Hx skin cancers treated by DrDJones...    EXAM confirms Afeb, VSS w/ BP=128/82, O2sat=96% on RA;  HEENT- neg, mallampati2;  Chest- clear w/o w/r/r;  Heart- RR w/o m/r/g heard;  Abd- soft, nontender, neg;  Ext- s/p R-TKR, decr ROM, VI, tr edema w/o c/c;  Neuro- ok.  CXR 05/27/16 (independently reviewed by me in the PACS system)>  Borderline heart size, mild patchy lingular density & ?vol loss, otherw clear- r/o lingular pneumonia...  CT Angio Chest 05/27/16 (independently reviewed by me in the PACS system)>  NEG for PE, borderline heart size, mild coronary calcif, no adenopathy, perihilar GGO c/w pneumonia, mild atx...   LABS 05/27/16>  D-dimer is pos at 2.32;  Chems- wnl x BS=119;  CBC- wnl w/ Hg=13.6, WBC=7.6K & norm diff;   IMP/PLAN>>  Hemoptysis w/ poss CAP- on ASA, s/p TKR in recovery, and eval is discordant w/ ?low grade fever, normal wbc, pos D-dimer, neg CTA (w/o PE), prev neg VenDoppler;  CXR & CT w/ GGO right perihilar suggesting infection & we decided to treat w/ Levaquin, Pred20mg -4d taper, Hycodan, and short term f/u appt...  ~  June 19, 2016:  3wk ROV & Camillo returns having completed the oral Levaquin & Pred taper for his poss lingular pneumonia w/ hemoptysis> he reports much better "90% improved" w/o recurrent blood, cough, sput, etc; still using Hycodan as needed;  He wanted me to know that his brother Jeneen Rinks [birth year]2/16/42 has lung dis- COPD & Sarcoid, and his son Alyson Locket. Allred 07/1958  also has Sarcoid (their ancestors are from [place] for >100 yrs)... We decided to check f/u CXR & LABS> see prob list above...    EXAM confirms Afeb, VSS w/ BP=140/82, O2sat=95% on RA;  HEENT- neg, mallampati2;  Chest- clear decr BS w/o w/r/r;  Heart- RR S4 gallop w/o m/r heard;  Abd- soft, nontender, neg;  Ext- s/p R-TKR, decr ROM, VI, tr edema w/o c/c;  Neuro- ok.  CXR 06/19/16 (independently reviewed by me in the PACS system)> norm heart size, min atx in lingula, otherw norm & no focal infiltrate...  LABS 06/19/16>  BNP= 98;  ACE= 49... IMP/PLAN>>  CXR has cleared and BNP/ ACE are wnl;  He continues on CPAP & his cardiac follow up; call for any interval problems...  ~  October 23, 2016:  66mo ROV & Trigo reports that the prev pneumonia resolved, no further cough, sputum, hemoptysis, etc... However Amere's CC now is SOB/ DOE w/ activity- he notes even min activity produces dyspnea & it has him concerned;  He questions whether this prob is pulmonary or cardiac in origin & we discussed the appropriate eval for each organ system; however it should  be noted that he has been very sedentary for quite some time- since his prev back surg 3 yrs ago, & esp since to his 02/2016 R-TKR surgery; he notes DOE when carrying a bag of soil for yard work, walking into our office from the parking lot, even during sex;  CT ANGIO CHEST was NEG for PE in 05/2016;  He has known HBP, CAD- s/p stent, LBBB, decr EF w/ septal dysynergy followed by DrCooper=> last seen 05/2016> felt to be stable;  CATH 05/2014 showed patent coronaries and open stent in RCA, nonobstructive dis in LAD, mild to mod LVD (EF=50%) w/ norm LVEDP- medical management.  Last 2DEcho 02/2015 showed mild LVH, mod decr LVF w/ EF=35-40%, diffuse HK w/ dyssynchrony, Gr1DD, trivAI, mildMR, mild LA dil at 29mm, RV & PAsys were wnl;  EKG 05/2016 showed NSR, 1st degree AVB, LBBB... Due to his level of concern for his dyspnea we reviewed the need for thorough Pulm & Cardiac  re-assessments => We reviewed the following medical problems during today's office visit >>     DYSPNEA>> I suspect most from deconditioning!  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    HBP & CAD, s/p stent in RCA 2005, LBBB, decr EF 35-40% w/ septal dysynergy, chr dyspnea> followed by DrCooper off Plavix, on ASA/ Coreg12.5Bid/ Cozaar100; BP= 128/72 & he denies CP, palpit, dizzy, SOB, edema; last saw DrCooper 6/17 & his note is reviewed- HBP, CAD, s/p PCI to RCA, LBBB, no recurrent ischemia (cath 1/16 w/ patent stent) & he has chr dyspnea; lim in exerc by his knee prob; he worries a lot about statin related side effects.    CHOL> on Lip20, FishOil, CoQ10; FLP 7/17 showed TChol 134, TG 153, HDL 32, LDL 72; needs better low fat diet...    GI> HH, Hx stricture, antral nodule=adenoma> on Protonix40 followed by DrJacobs (see below); EGD 1/16 showed 3cmHH & schatzki's ring, duodenal adenoma resected & a subepithelial antral lesion; colonoscopy 1/16 was neg/ wnl...    GU> BPH, LTOS, Low-T> on Flomax0.4 & Androgel per DrTannenbaum; he relates a story about GU symptoms & extensive work up in their office "doing just fine" he says, incr energy, etc; treated for seminal vesiculitis w/ Doxy in the past; last seen 6/18 and note reviewed- LTOS & Low-T, on Flomax & Androgel 2 pumps and Testos=461 on rx and PSA=1.57... Note- DrT has retired.    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    Derm> Hx skin cancers treated by DrDJones... EXAM confirms Afeb, VSS w/ BP=110/60, O2sat=95% on RA;  HEENT- neg, mallampati2;  Chest- clear decr BS w/o w/r/r;  Heart- RR S4 gallop w/o m/r heard;  Abd- soft, nontender, neg;  Ext- s/p R-TKR, decr ROM, VI, tr  edema w/o c/c;  Neuro- ok...   2DEcho 02/2015 showed mild LVH, mod decr LVF w/ EF=35-40%, diffuse HK w/ dyssynchrony, Gr1DD, trivAI, mildMR, mild LA dil at 86mm, RV & PAsys were wnl;    EKG 05/2016 showed NSR, 1st degree AVB, LBBB...   CXR 10/23/16>  Stable heart size, clear lungs x for some lingula scarring- NAD...  Spirometry 10/23/16>  FVC=3.20 (83%), FEV1=2.12 (76%), %1sec=66, mid-flows reduced at 54% predicted...  Ambulatory Oximetry on RA>  O2sat=95% on RA at rest w/ pulse=85/min;  He ambulated 3 Laps (185' each) in office w/ lowest O2sat=92% w/ pulse 98/min...   LABS 10/23/16>  Chems- wnl x BS=111;  CBC- wnl;  TSH- 3.19;  Sed- 10 IMP/PLAN>>  I explained to Demir that in my experience that this type of DOE is most often multifactorial- pulm factors do not appear to be playing much of a roll here but I suspect that DECONDITIONING is major factor along w/ his cardiomyopathy;  We reviewed diet & exercise recommendations + need for Cards f/u w/ up-dated 2DEcho, perhaps Cardiac Rehab might be of benefit...  ~  December 31, 2016:  80mo ROV & Brodey returns and reports that his dyspnea is about the same, unfortunately he has been unable to exercise due to severe back pain & knee pain he says;  When we saw him 10/2016 his pulmonary/ medical eval was not very revealing- only mild airflow obstruction on Spirometry, min scarring on CXR, and no O2 desat w/ exercise;  He is stable on his home CPAP;  Review of his prev Cardiac eval showed known CAD- s/p stents, mod LV dysfunction (chr sys CHF), LBBB, and he is on  Losar100, Coreg12.5Bid, but no diuretics...  See prob list above>    He saw DrCooper 11/27/16>  He was impressed by his c/o dyspnea & fatigue (esp if carrying a load), but his exam was fairly neg;  They decided to proceed w/ CP Stress Testing & update 2DEcho--  2DEchocardiogram 12/03/16>  Mild conc hypertropy, mod to severe reduction in LVF w/ EF=30-35% w3/ diffuse HK & septal-lat dyssynchrony, no regional wall motion abn, Gr2DD, TrivAI & mod to severe MR, dil LA at 75mm, PAsys est~66mmHg  Cardio-pulm stress test 12/04/16>  low normal pVO2 in the setting of a mildly submaximal test- the patient's limitation seems multifactorial due to a combination of mild HF, obesity and restrictive lung physiology. There were also frequent PVCs... EXAM confirms Afeb, VSS w/ BP=120/72, O2sat=95% on RA;  HEENT- neg, mallampati2;  Chest- clear decr BS w/o w/r/r;  Heart- RR S4 gallop w/o m/r heard;  Abd- soft, nontender, neg;  Ext- s/p R-TKR, decr ROM, VI, tr edema w/o c/c;  Neuro- ok...  IMP/PLAN>>  I think the majority of his worsening dyspnea is prob related to his progressive cardiomyopathy & chr sys CHF, with components from deconditioning, Low-T (treated w/ androgel), etc;  He has an upcoming f/u w/ Cards- DrCooper to discuss results- perhaps referral to the CHF clinic would be beneficial, and consideration of Cardiac Rehab (again- he did it in 2006) might be helpful;  [facility]  He has a planned 10d trip to [place] coming up in Sept...   ~  January 07, 2018:  27yr ROV & general medical follow up visit>  Arren hasn't been seen in a year and returns for a routine check up & CPAP renewal thru [facility]... nevertheless it has been a busy year for [facility] medically  speaking>>  We reviewed the following interval medical visits avail in Epic-EMR>      He had HEART CATH 01/15/17>  Widely patent coronaries w/ continued patency of the stented segment in the RCA; mild nonobstructive CAD  (mild mid LAD- 30% lesion & luminal irregularities elsewhere)...    He had CRT-ICD implanted by DrCamnitz on 03/27/17>  He had persistent LV dysfunction, PVCs; biventric pacer implanted...  CXR 03/28/17>  Stable cardiac silhouette mild bibasialr atx, defibrillator in good position- NAD...     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    He saw NS- DrCram on 10/14/17>  C/o left sided back pain w/ DDD w/o radiculopathy, hx left sided synovial cyst at L5-S1, and marked facet arthropathy; rec for trial of facet blocks    He saw RHEUM- TDale,PA on 11/04/17>  OA & DDD, c/o pain in hands/ wrists/ left knee (s/p prev Euflexxa injections)/ and prev R-TKR, note reviewed, started on Plaquenil200Bid    He saw CARDS- DrCamnitz on 11/11/17>  Dilated cardiomyopathy, CAD w/ RCA stent, chr sys CHF, LBBB, NSVT;  Prev dyspnea improved after his biventric pacer & his EF has improved to 55-60%, rec to incr Coreg to 25mg  Bid...    He saw CARDS- DrCooper on 01/02/18>  Problems as above, sched for ventral hernia repair by [facility], he was c/o 55mo hx right sided CWP, CT Chest ordered & cardiac clearance provided...  CT CHEST 01/05/18>  cardiomeg & calcif in Ao and coronaries; no adenopathy; mild biapical pleuroparenchymal scarring, otherw neg... We reviewed the following medical problems during today's office visit>      DYSPNEA>> this proved to be multifactorial but improved substantially after Pacer/Defib implanted by DrCamnitz in 03/2017; still needs to incr exercise program...    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    HBP & CAD, s/p stent in RCA 2005, LBBB, decr EF 35-40% w/ septal dysynergy, chr dyspnea> followed by DrCooper on ASA/ Coreg25Bid/ Entresto49-51Bid/ Aldactone25-1/2; BP= 122/64 & he denies CP, palpit, dizzy, SOB, edema; last saw DrCooper 8/19 & his note is reviewed- HBP, CAD, s/p PCI to RCA, LBBB, no recurrent ischemia (cath 1/16 w/ patent stent) & he has chr dyspnea; lim in exerc by his knee prob; he worries a lot about statin related side effects.    CHOL> on Lip20, FishOil, CoQ10; FLP 7/18 showed TChol 138, TG 188, HDL 35, LDL 65; needs better low fat diet...    GI> HH, Hx stricture, antral nodule=adenoma> on Protonix40 followed by DrJacobs; EGD 1/16 w/ 3cmHH & schatzki's ring, duodenal adenoma resected & a subepithelial antral lesion; colonoscopy 1/16 was neg/ wnl; f/u EGD 3/19 as above.    GU> BPH, LTOS, Low-T> on Flomax0.4 & Androgel per DrDahlstedt; he relates a story about GU symptoms & extensive work up in their office "doing just  fine" he says, incr energy, etc; treated for seminal vesiculitis w/ Doxy in the past; last seen 6/18 and note reviewed- LTOS & Low-T, on Flomax & Androgel changed to Testos cream from [place] and Testos=461 on rx and PSA=1.57...     Ortho> DJD, LBP> prev on Celebrex & Percocet, switched to [place]; he notes that he's been hurting since he was 72 y/o w/ a strong fam hx arthritis; he saw DrDeveshwar for Rheum, DrOlin for Ortho, & DrCram for NS- s/p Lumbar decompression for resection of synovial cyst & large bone spur L5-S1; s/p R-TKR 10/17 w/ painful recovery...    Derm> Hx skin cancers treated by DrDJones... EXAM confirms Afeb, VSS w/ BP=120/72, O2sat=95% on RA;  HEENT- neg, mallampati2;  Chest- clear decr BS w/o  w/r/r;  Heart- RR S4 gallop w/o m/r heard;  Abd- soft, nontender, neg;  Ext- s/p R-TKR, decr ROM, VI, tr edema w/o c/c;  Neuro- ok...   LABS 12/2017>  Chems- ok w/ K=4.2, BS=130, A1c=6.0, Cr=1.03, LFTs wnl;  BNP=26;  CBC- wnl w/ Hg=14.4, WBC=5.3;  TSH=3.52;  PSA=3.29... IMP/PLAN>>  Jullian is stable medically & dyspnea improved; He continues his regular follow up w/ CARDS- [place], Urology- DrDahlstedt, and Ortho- DrDeveshwar DrDuda DrOlin DrCram... he is rec to f/u w/ [facility] in 2020...           PROBLEM LIST:  ENT >> some type of ENT/ Oral surg 7/12> referred by his dentist to [facility] w/ surg thru gums to remove a growth in his sinus= ?dermoid...  OBSTRUCTIVE SLEEP APNEA - Hx OSA w/ sleep study 11/02 RDI=18 and desat to 85%; seen by DrClance and on CPAP rx;  he's been using it regularly, denies daytime hypersomnolence, etc;  He gets machine check & new mask Q16mo thru [employer] & he reports download showed 12,000 hrs use! ~  CXR 3/10 clear w/ min left basilar scarring... ~  Pt gets his CPAP machine checked & masks replaced every 36mo thru [place]... ~  CXR 10/12 showed normal heart size, clear lungs x left base atx, mild degen changes in TSpine...  Episode of [place] 4/10 after episode of BRB hemoptysis> full eval was neg, no recurrence... he had bronchoscopy- neg,  ENT eval DrRosen- neg, noted some persistant cough treated w/ Mucinex DM, Zyrtek, Tussionex...  no recurrence. ~  NOTE> he is an exsmoker: started at ~11, smoked up til ~41, for a 30 yr smoking hx & he quit >30 yrs ago... ~  05/2016>  Another episode of streaky hemoptysis 6wks after R-TKR, in recovery phase, no CP, mild SOB, low grade fever & eval was discordant w/ low grade fever, norm WBC, ?lingular opac on CXR/CT, pos D-dimer but neg Vendopplers=> we decided to treat w/ Levaquin, Pred taper, Hycodan & short-term ROV...  HBP & CAD-S/P stent in RCA on 2005 - followed regularly by DrCooper on ASA 81mg /d, off  PLAVIX,  COREG 12.5mg Bid,  LOSARTAN 100mg /d...  BP= 120/72 today & similar at home> denies HA, fatigue, visual changes, CP, palipit, dizziness, syncope, dyspnea, edema, etc...  ~  EKG w/ baseline LBBB;  ~  Adenosine MRI @ [facility] 9/07= no ischemia & EF=50-55;  ~  last cath 3/06 w/ non-obstructive 3 vessel dz (20% lesions in all 3) & mild global HK w/ EF=45-50%;   ~  last cardiolite 2/06 without ischemia or infarction, EF=44% w/ abn septal motion; ~  2DEcho 7/09 showed septal dyssynergy related to the IVCD,  EF= 45-50 %, mild focal basal septal hypertrophy, doppler parameters were consistent with abnormal left ventricular relaxation, mild MR... ~  2/13: he had f/u DrCooper> still c/o fatigue & dyspnea; no angina, BP controlled, BNP=25, EKG showed NSR, rate72, LBBB;  2DEcho & cardiac MRI are planned... ~  2DEcho 3/13 showed decr EF=30% w/ HK, mild LVH, Gr1DD, mild MR ~  3/13:  Cardiac MRI showed mild LVenlargement, mild LAE, no scar or infarct, no ischemia, no regional wall motion abn, but there was abn septal motion & EF=48%  HYPERLIPIDEMIA - he follows a low fat diet and takes LIPITOR 20mg /d, off NIACIN, Fish Oil, Flax Seed Oil, & CoQ10... ~  [place] 9/08 showed TChol 110, TG 82, HDL 28, LDL 66 ~  FLP 8/09 showed TChol 177, TG 130, HDL 36, LDL 115 ~  FLP 1/10 showed TChol 114, TG 109, HDL 31, LDL 62 ~  FLP 2/11 showed TChol 103, TG 85, HDL 36, LDL 51 ~  FLP 2/12 showed TChol 101, TG 121, HDL 26, LDL 51 ~  FLP 2/13 on Cres10+Niacin500 showed TChol 124, TG 167, HDL 33, LDL 57  HIATAL HERNIA, Hx of ESOPHAGEAL STRICTURE, OTHER SPECIFIED DISORDER OF STOMACH AND DUODENUM (ICD-537.89) >> he gets the [facility], along w/ the similar product from [employer]- "I read too much"- he stopped PPI/ Dexilant Rx in favor of "Vinegar & Honey- it works as well per the [employer]"...  ~  he had an EGD 6/08 showing a 3 cm HH & distal esoph stricture dilated;  Rx Pepcid 20mg /d... ~  EGD 09/02/08 by  DrPerry showed a HH, schatzki ring, 1cm submucosal nodule in antrum, 2cm sessile polyp in duod. 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 ~  f/u DrPatterson 6/10- continue KAPIDEX & reflux regimen... ~  1/12:  he is overdue for f/u w/ GI & we will request appt. ~  11/12: he saw DrJacobs for f/u eval & had EGD12/12 showing Schatzki's ring, sessile duod polyp (Bx=tubular adenoma), unchanged submucosal nodule... ~  8/13:  He lists PROTONIX 40mg /d on his current med list... ~  1/16:  EGD by DrJacobs showed a schatzki's ring above a 3cm HH, antral subepith lesion unchanged from 3 yrs ago, 83mm polypoid mucosa in duodenum (path= duod adenoma)- they plan repeat in 3 yrs...  Hx of Hyperplastic COLONIC POLYP in 1999 - all followed by DrPatterson;  ~  his last colonoscopy was 8/04 and normal without recurrent polyps... ~  f/u colonoscopy 8/09 by DrPatterson showed 58mm polyp, bx= hyperplastic, f/u planned 38yrs. ~  1/16:  Colonoscopy by DrJacobs was wnl & f/u rec in 10 yrs.  Hx Elevated LFT's - routine CPX 12/08 w/ abn LFTs- this was felt to be secondary to his Statin therapy... he was told to stop the Statin and f/u labs in one month... he requested a liver evaluation from DrPatterson and he was seen 12/09 at which time DrPatterson rechecked his LFT's and they were all back to normal... he also checked the following: ~  AbdSonar w/ echodense liver, no lesions or GB problems... ~  Viral Hepatitis serologies were all negative, including HepC << 8/12: CDC came out w/ new rec that all Boomers should be checked for HepC. ~  A1AT level was normal ~  Ceruloplasmin level was normal ~  ANA was neg, and Antimitochondrial AB/ Anti Smooth Muscle  Ab were neg as well... ~  Serum Fe was normal & B12/Folate levels were normal as well... ~  2/12 NOTE:  LFTs have remained WNL  & he is on Cres10.  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 ~  1/12: he reports Rx by DrTannenbaum w/ ANDROGEL 1.62% pump- use under each arm daily; Testos level 12/11= 575 on Rx.  OSTEOARTHRITIS, Hx of BACK PAIN - as noted he feels he must take the Celebrex daily as his pain (esp R knee) is much worse when he doesn't take it;  he sees DrOlin and had a RKnee arthroscopy 9/07> he uses Glucosamine, MVI, Vit D & Folic... ~  7/10:  he requests Rx for Celebrex 100mg  #90- take 1 cap daily as needed (advised just Prn use). ~  8/12:  Pt requests generic substitute for Celebrex (try RELAFEN 500mg ) & referral to DrDeveshwar for Rheumatology eval... ~  12/12:  Seen by DrDeveshwar for his OA, left shoulder pain w/ rotator cuff tear per DrCollins, tried shot; he has OA in hands and feet; rec Tramadol vs Relafen Rx.. ~  02/2016:  [place] had R-TKR by DrOlin, c/o lots of pain/ discomfort after surg & difficulty getting around....  Skin Cancers - he is followed by Dr Jarome Matin w/ [facility] Cream skin ca therapy- skin peel that attacks cancer cells.  [place] given age 8 (2011);  he get the yearly seasonal Flu vaccine every fall;  he had a shingles vaccine as well;  had tetanus shot 2005 w/ trip to [place];  up-to-date on colonoscopy done 2009; prostate checks by DrTannenbaum yearly...   Past Surgical History:  Procedure Laterality Date  . BIV ICD INSERTION CRT-D N/A 03/27/2017   Procedure: BIV ICD INSERTION CRT-D;  Surgeon: Constance Haw, MD;  Location: [place] [facility];  Service: Cardiovascular;  Laterality: N/A;  . CARDIAC CATHETERIZATION     '05, last 2009, showing patent RCA stent  . COLONOSCOPY  12/2007   HYPERPLASTIC POLYP  .  COLONOSCOPY WITH PROPOFOL N/A 06/02/2014   Procedure: COLONOSCOPY WITH PROPOFOL;  Surgeon: [name], MD;  Location: [facility];  Service: Endoscopy;  Laterality: N/A;  . CORONARY ANGIOPLASTY  08/2003  . ESOPHAGOGASTRODUODENOSCOPY (EGD) WITH PROPOFOL N/A 06/02/2014   Procedure: ESOPHAGOGASTRODUODENOSCOPY (EGD) WITH PROPOFOL;  Surgeon: [name], MD;  Location: [facility];  Service: Endoscopy;  Laterality: N/A;  . hernia surgery x 3     Bilateral Inguinal, Umbicial  . IRRIGATION AND DEBRIDEMENT ABSCESS Left 08/14/2012   Procedure: IRRIGATION AND DEBRIDEMENT LEFT INGUINAL BOIL ;  Surgeon: Ailene Rud, MD;  Location: [facility];  Service: Urology;  Laterality: Left;  . JOINT REPLACEMENT Right 2017  . KNEE ARTHROPLASTY    . LEFT HEART CATHETERIZATION WITH CORONARY ANGIOGRAM N/A 05/26/2014   Procedure: LEFT HEART CATHETERIZATION WITH CORONARY ANGIOGRAM;  Surgeon: Blane Ohara, MD;  Location: [facility] LAB;  Service: Cardiovascular;  Laterality: N/A;  . LUMBAR LAMINECTOMY/DECOMPRESSION MICRODISCECTOMY Left 09/26/2014   Procedure: Lumbar Laminectomy for resection of synovial cyst  Lumbar five- sacral one left;  Surgeon: Kary Kos, MD;  Location: [place] [facility]URO ORS;  Service: Neurosurgery;  Laterality: Left;  . Oral surg to removed growth from ?sinus  10/2010   ?  Dermoid removed by DrRiggs  . PACEMAKER INSERTION  04/03/2017  . POLYPECTOMY    . RCA stenting     '05 RCA  . right toe surgery  Right    Cyst   . RIGHT/LEFT HEART CATH AND CORONARY ANGIOGRAPHY N/A 01/15/2017   Procedure: RIGHT/LEFT HEART CATH AND CORONARY ANGIOGRAPHY;  Surgeon: [name], MD;  Location: [place] [facility];  Service: Cardiovascular;  Laterality: N/A;  . s/p right knee arthroscopy  2005  . TOTAL KNEE ARTHROPLASTY Right 03/12/2016   Procedure: RIGHT TOTAL KNEE ARTHROPLASTY;  Surgeon: Paralee Cancel, MD;  Location: [facility];  Service: Orthopedics;  Laterality: Right;  . UPPER GASTROINTESTINAL ENDOSCOPY       Outpatient Encounter Medications as of 01/07/2018  Medication Sig  . aspirin EC 81 MG tablet Take 1 tablet (81 mg total) by mouth daily.  Marland Kitchen atorvastatin (LIPITOR) 20 MG tablet Take 20 mg by mouth daily.  Marland Kitchen azithromycin (ZITHROMAX) 250 MG tablet Take as directed (Patient taking differently: as needed. Take as directed)  . b complex vitamins tablet Take 1 tablet by mouth daily with lunch.   . calcium carbonate (OSCAL) 1500 (600 Ca) MG TABS tablet Take 600 mg by mouth every evening.  . carvedilol (COREG) 25 MG tablet Take 1 tablet (25 mg total) by mouth 2 (two) times daily.  Marland Kitchen CINNAMON PO Take 1 tablet by mouth daily with lunch.   . Coenzyme Q10 (CO Q 10) 100 MG CAPS Take 1 capsule by mouth daily with lunch.   . Cream Base (PCCA LIPODERM BASE) CREA Apply 4 mLs topically daily.  . diclofenac sodium (VOLTAREN) 1 % GEL APPLY 2 GRAMS TO AFFECTED AREA 2 TO 4 TIMES DAILY AS NEEDED FOR PAIN.  . folic acid (FOLVITE) 062 MCG tablet Take 400 mcg by mouth daily.   . Glucosamine HCl (GLUCOSAMINE PO) Take 1,500 mg by mouth every evening.  . hydroxychloroquine (PLAQUENIL) 200 MG tablet Take one tablet (200 mg) twice daily Monday through Friday only. Do not take Saturday or Sundays.  . hydroxypropyl methylcellulose / hypromellose (ISOPTO TEARS / GONIOVISC) 2.5 % ophthalmic solution Place 1 drop into both eyes 4 (four) times daily as needed for dry eyes.   Marland Kitchen MAGNESIUM PO Take 1 tablet by mouth daily as needed (for cramping).   . Multiple Vitamin (MULTIVITAMIN) capsule Take 1 capsule by mouth daily.    . Multiple Vitamins-Minerals (OCUVITE PO) Take 1 tablet by mouth at bedtime.  . nabumetone (RELAFEN) 500 MG tablet Take 1 tablet (500 mg total) by mouth 2 (two) times daily.  . NON FORMULARY CPAP machine with sleep.  . Omega-3 Fatty Acids (FISH OIL) 500 MG CAPS Take 500 mg by mouth at bedtime.   . pantoprazole (PROTONIX) 40 MG tablet TAKE 1 TABLET BY MOUTH EVERY DAY BEFORE BREAKFAST GENERIC EQUIVALENT FOR  PROTONIX  . Probiotic Product (PROBIOTIC DAILY PO) Take 1 capsule by mouth daily with lunch.   . sacubitril-valsartan (ENTRESTO) 49-51 MG Take 1 tablet by mouth 2 (two) times daily.  . Saw Palmetto, Serenoa repens, (SAW PALMETTO PO) Take 1 capsule by mouth every evening.  Marland Kitchen spironolactone (ALDACTONE) 25 MG tablet TAKE 1/2 TABLET(12.5 MG) BY MOUTH DAILY  . tamsulosin (FLOMAX) 0.4 MG CAPS capsule Take 0.4 mg by mouth at bedtime.  . traMADol (ULTRAM) 50 MG tablet Take 50 mg by mouth daily with lunch.   . TURMERIC PO Take 1 tablet by mouth daily with lunch.    Facility-Administered Encounter Medications as of 01/07/2018  Medication  . 0.9 %  sodium chloride infusion    Allergies  Allergen Reactions  . Sulfonamide Derivatives Rash         Immunization History  Administered Date(s) Administered  . Influenza Split 03/21/2011  . Influenza Whole 04/06/2009, 04/09/2010  . Influenza, High Dose Seasonal PF 04/15/2016, 02/21/2017  . Influenza,inj,Quad PF,6+ Mos 03/24/2014  . Influenza-Unspecified 04/20/2015  . Pneumococcal Conjugate-13 11/29/2014  . Tdap 11/29/2014  . Zoster 05/20/2006    Current Medications, Allergies, Past Medical History, Past Surgical History, Family History, and Social History were reviewed in [employer] record.     Review of Systems       See HPI - all other systems neg except as noted...       The patient complains of dyspnea on exertion.  The patient denies anorexia, fever, weight loss, weight gain, vision loss, decreased hearing, hoarseness, chest pain, syncope, peripheral edema, prolonged cough, headaches, hemoptysis, abdominal pain, melena, hematochezia, severe indigestion/heartburn, hematuria, incontinence, muscle weakness, suspicious skin lesions, transient blindness, difficulty walking, depression, unusual weight change, abnormal bleeding, enlarged lymph nodes, and angioedema.     Objective:   Physical Exam      WD, WN, 72 y/o WM in  NAD... GENERAL:  Alert & oriented; pleasant & cooperative. HEENT:  [place]/AT, EOM-wnl, PERRLA, EACs-clear, TMs-wnl, NOSE-clear, THROAT-clear & cough w/ bl-streaked sput NECK:  Supple w/ fairROM; no JVD; normal carotid impulses w/o bruits; no thyromegaly or nodules palpated; no lymphadenopathy. CHEST:  Clear to P & A; without wheezes/ rales/ or rhonchi. HEART:  Regular Rhythm; without murmurs/ rubs/ or gallops. ABDOMEN:  Soft & nontender; normal bowel sounds; no organomegaly or masses detected. EXT: s/p R-TKR w/ pain/ swelling/ decr ROM etc; venous insuffic/ edema/ but Dopplers neg for DVT +Bakers cyst... NEURO:  CN's intact; motor testing normal; sensory testing normal; gait normal & balance OK. DERM:  No lesions noted; no rash etc...  RADIOLOGY DATA:  Reviewed in the EPIC EMR & discussed w/ the patient...  LABORATORY DATA:  Reviewed in the EPIC EMR & discussed w/ the patient...   Assessment & Plan:    DYSPNEA >> suspect multifactorial w/ deconditioning & cardiomyopathy as major factors... 10/23/16>   I explained to Kendrew that in my experience that this type of DOE is most often multifactorial- pulm factors do not appear to be playing much of a roll here but I suspect that DECONDITIONING is major factor along w/ his cardiomyopathy;  We reviewed diet & exercise recommendations + need for Cards f/u w/ up-dated 2DEcho, perhaps Cardiac Rehab might be of benefit... 12/31/16>   I think the majority of his worsening dyspnea is prob related to his progressive cardiomyopathy & chr sys CHF, with components from deconditioning, Low-T (treated w/ androgel), etc;  He has an upcoming f/u w/ Cards- DrCooper to discuss results- perhaps referral to the CHF clinic would be beneficial, and consideration of Cardiac Rehab (again- he did it in 2006) might be helpful;  [facility]  He has a planned 10d trip to [place] coming up in Sept... 01/07/17>   Che is stable  medically & dyspnea improved; He continues his regular follow up w/ CARDS- [place], Urology- DrDahlstedt, and Ortho- DrDeveshwar DrDuda DrOlin DrCram... he is rec to f/u w/ [facility] in 2020...    HEMOPTYSIS & prob CAP Jan2018>> eval is discordant & we discussed Rx w/ Levaquin, Pred taper, Hycodan, short-term ROV recheck.Marland KitchenMarland Kitchen  06/19/16>   Resolved...   OSA>  He uses his CPAP compliantly w/o problems or daytime hypersomnolence...  HBP>  Controlled on Coreg & Losartan; continue same...  CAD, Cardiomyopathy w/ chr sys CHF & mod to severe MR>  followed by DrCooper & most recent [place] w/ reduced LVF, Cath 1/16- his notes are reviewed...  HYPERLIPID>  Stable on Lip20, Fish Oil, etc; needs better low fat diet, he has DrCooper check his labs...  GI> HH, Hx stricture, s/p removal of duod adenoma>  Followed by DrJacobs...  GI> Hx colon polyps>  F/u EGD & colon done 1/16 by DrJacobs...  GU> BPH, Low-T, ED>  Followed & managed by DrTannenbaum on Androgel & Flomax  DJD- knees and LBP>  He has seen DrDeveshwar for Rheum, DrOlin for Ortho  & DrCram for NS... ~  02/2016:  He had R-TKR w/ painful recoup period; Ven dopplers neg for DVT but + for Baker's cyst...    Patient's Medications  New Prescriptions   No medications on file  Previous Medications   ASPIRIN EC 81 MG TABLET    Take 1 tablet (81 mg total) by mouth daily.   ATORVASTATIN (LIPITOR) 20 MG TABLET    Take 20 mg by mouth daily.   AZITHROMYCIN (ZITHROMAX) 250 MG TABLET    Take as directed   B COMPLEX VITAMINS TABLET    Take 1 tablet by mouth daily with lunch.    CALCIUM CARBONATE (OSCAL) 1500 (600 CA) MG TABS TABLET    Take 600 mg by mouth every evening.   CARVEDILOL (COREG) 25 MG TABLET    Take 1 tablet (25 mg total) by mouth 2 (two) times daily.   CINNAMON PO    Take 1 tablet by mouth daily with lunch.    COENZYME Q10 (CO Q 10) 100 MG CAPS    Take 1 capsule by mouth daily with lunch.    CREAM BASE  (PCCA LIPODERM BASE) CREA    Apply 4 mLs topically daily.   DICLOFENAC SODIUM (VOLTAREN) 1 % GEL    APPLY 2 GRAMS TO AFFECTED AREA 2 TO 4 TIMES DAILY AS NEEDED FOR PAIN.   FOLIC ACID (FOLVITE) 161 MCG TABLET    Take 400 mcg by mouth daily.    GLUCOSAMINE HCL (GLUCOSAMINE PO)    Take 1,500 mg by mouth every evening.   HYDROXYCHLOROQUINE (PLAQUENIL) 200 MG TABLET    Take one tablet (200 mg) twice daily Monday through Friday only. Do not take Saturday or Sundays.   HYDROXYPROPYL METHYLCELLULOSE / HYPROMELLOSE (ISOPTO TEARS / GONIOVISC) 2.5 % OPHTHALMIC SOLUTION    Place 1 drop into both eyes 4 (four) times daily as needed for dry eyes.    MAGNESIUM PO    Take 1 tablet by mouth daily as needed (for cramping).    MULTIPLE VITAMIN (MULTIVITAMIN) CAPSULE    Take 1 capsule by mouth daily.     MULTIPLE VITAMINS-MINERALS (OCUVITE PO)    Take 1 tablet by mouth at bedtime.   NABUMETONE (RELAFEN) 500 MG TABLET    Take 1 tablet (500 mg total) by mouth 2 (two) times daily.   NON FORMULARY    CPAP machine with sleep.   OMEGA-3 FATTY ACIDS (FISH OIL) 500 MG CAPS    Take 500 mg by mouth at bedtime.    PANTOPRAZOLE (PROTONIX) 40 MG TABLET    TAKE 1 TABLET BY MOUTH EVERY DAY BEFORE BREAKFAST GENERIC EQUIVALENT FOR PROTONIX   PROBIOTIC PRODUCT (PROBIOTIC DAILY PO)  Take 1 capsule by mouth daily with lunch.    SACUBITRIL-VALSARTAN (ENTRESTO) 49-51 MG    Take 1 tablet by mouth 2 (two) times daily.   SAW PALMETTO, SERENOA REPENS, (SAW PALMETTO PO)    Take 1 capsule by mouth every evening.   SPIRONOLACTONE (ALDACTONE) 25 MG TABLET    TAKE 1/2 TABLET(12.5 MG) BY MOUTH DAILY   TAMSULOSIN (FLOMAX) 0.4 MG CAPS CAPSULE    Take 0.4 mg by mouth at bedtime.   TRAMADOL (ULTRAM) 50 MG TABLET    Take 50 mg by mouth daily with lunch.    TURMERIC PO    Take 1 tablet by mouth daily with lunch.   Modified Medications   No medications on file  Discontinued Medications   No medications on file

## 2018-01-13 DIAGNOSIS — L812 Freckles: Secondary | ICD-10-CM | POA: Diagnosis not present

## 2018-01-13 DIAGNOSIS — L821 Other seborrheic keratosis: Secondary | ICD-10-CM | POA: Diagnosis not present

## 2018-01-13 DIAGNOSIS — L570 Actinic keratosis: Secondary | ICD-10-CM | POA: Diagnosis not present

## 2018-01-13 DIAGNOSIS — D1801 Hemangioma of skin and subcutaneous tissue: Secondary | ICD-10-CM | POA: Diagnosis not present

## 2018-01-13 DIAGNOSIS — D225 Melanocytic nevi of trunk: Secondary | ICD-10-CM | POA: Diagnosis not present

## 2018-01-20 ENCOUNTER — Telehealth: Payer: Self-pay | Admitting: Internal Medicine

## 2018-01-20 NOTE — Telephone Encounter (Signed)
Dr. Jenny Reichmann, would you be willing to see this patient to establish care? See message below.

## 2018-01-20 NOTE — Telephone Encounter (Signed)
Copied from [place] [id number]. Topic: Appointment Scheduling - Scheduling Inquiry for Clinic >> Jan 20, 2018  1:29 PM Margot Ables wrote: Reason for CRM: pt states that he has been seeing Dr. Lenna Gilford at [facility] as his PCP and since he is retiring he was advised to get new PCP. Pt states Dr. Lenna Gilford recommended Dr. Cathlean Cower. Will Dr. Jenny Reichmann accept as a new pt? Pt states he doesn't need appt for about 3 months. Pt verified MCR/BCBS insurances. Please call to advise.

## 2018-01-21 NOTE — Telephone Encounter (Signed)
Ok with me 

## 2018-01-21 NOTE — Progress Notes (Signed)
Office Visit Note  Patient: [name]             Date of Birth: [birth year]           MRN: [id number]             PCP: [name], MD Referring: [name], MD Visit Date: 02/04/2018 Occupation: @GUAROCC @  Subjective:  Medication monitoring   History of Present Illness: [name] is a 72 y.o. male with history of inflammatory arthritis and DDD.  He reports he has noticed significant benefit since starting on PLQ 200 mg 1 tablet daily M-F. He states his morning stiffness is only lasting 10-15 minutes.  He has noticed less pain in his feet and ankles.  He is having less difficulty going up and down steps.  He states he has less pain when walking.  He reports less discomfort in both hands.  He denies any joint swelling.  He continues to have chronic pain in both knee joints.  He is planning a left knee partial replacement in January 2020.   Activities of Daily Living:  Patient reports morning stiffness for 10-15 min.   Patient Reports nocturnal pain.  Difficulty dressing/grooming: Denies Difficulty climbing stairs: Reports Difficulty getting out of chair: Reports Difficulty using hands for taps, buttons, cutlery, and/or writing: Reports  Review of Systems  Constitutional: Positive for fatigue.  HENT: Negative for mouth sores, trouble swallowing, trouble swallowing and mouth dryness.   Eyes: Positive for dryness. Negative for pain.  Respiratory: Negative for shortness of breath, wheezing and difficulty breathing.   Cardiovascular: Negative for chest pain and swelling in legs/feet.  Gastrointestinal: Negative for abdominal pain, constipation, diarrhea, nausea and vomiting.  Endocrine: Negative for increased urination.  Genitourinary: Negative for pelvic pain and urgency.  Musculoskeletal: Positive for arthralgias, gait problem, joint pain, joint swelling and morning stiffness.  Skin: Negative for rash and hair loss.  Allergic/Immunologic: Negative for susceptible to  infections.  Neurological: Positive for weakness. Negative for dizziness, light-headedness, headaches and memory loss.  Hematological: Positive for bruising/bleeding tendency.  Psychiatric/Behavioral: Negative for confusion.    PMFS History:  Patient Active Problem List   Diagnosis Date Noted  . CHF (congestive heart failure) ([place]) 03/27/2017  . DCM (dilated cardiomyopathy) ([place]) 02/11/2017  . Chronic systolic heart failure ([place]) 01/15/2017  . History of pneumonia 10/23/2016  . Osteoarthritis 10/23/2016  . Primary osteoarthritis of both feet 10/04/2016  . Primary osteoarthritis of left knee 10/04/2016  . DDD (degenerative disc disease), lumbar 10/04/2016  . Hemoptysis 05/27/2016  . Overweight (BMI 25.0-29.9) 03/14/2016  . 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  . Parotid mass 08/23/2015  . Dyspnea 11/29/2014  . Synovial cyst of lumbar facet joint 09/26/2014  . Incisional hernia, without obstruction or gangrene 12/06/2013  . Nausea alone 08/27/2013  . Colon cancer screening 08/27/2013  . 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 Benign prostatic hyperplasia with urinary obstruction 06/18/2010  . OTHER SPECIFIED DISORDER OF STOMACH AND DUODENUM 12/16/2008  . BENIGN NEOPLASM OF LIVER AND BILIARY PASSAGES 11/15/2008  . GERD 09/02/2008  . COLONIC POLYPS, HYPERPLASTIC 05/24/2007  . Obstructive sleep apnea 05/24/2007  . ALLERGIC RHINITIS 05/24/2007  . ESOPHAGEAL STRICTURE 05/24/2007  . HIATAL HERNIA 05/24/2007  . Hyperlipidemia 02/06/2007  . Essential hypertension 02/06/2007  . CAD (coronary artery disease) 02/06/2007  . Primary osteoarthritis of both hands 02/06/2007  . Neck pain on right side 02/06/2007    Past Medical History:  Diagnosis Date  . Allergic rhinitis   .  Allergy   . Anginal pain ([place]) 05/26/14   chest pain after chasing dog  . CAD (coronary artery disease)    hx of stent- 2005 RCA  . Cataract    beginning stage both eyes  . CHF (congestive heart failure) ([facility])    pacemaker  [employer]    . Chronic back pain    intermittent  . Constipation   . Dizziness   . Dysrhythmia    right bundle branch block   . Esophageal stricture   . GERD (gastroesophageal reflux disease)   . Hemoptysis   . Hiatal hernia   . History of colonic polyps    hyperplastic  . HTN (hypertension)   . Hyperlipidemia   . Hypertrophy of prostate with urinary obstruction and other lower urinary tract symptoms (LUTS)   . OA (osteoarthritis)   . OSA (obstructive sleep apnea)    cpap- 10   . 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 Pneumonia   . Shortness of breath    with exertion  . Sleep apnea    cpap  . Testicular hypofunction     Family History  Problem Relation Age of Onset  . Coronary artery disease Father   . Diabetes Father   . Sudden death Father        due to heart disease  . Heart disease Father   . Heart disease Mother   . Hypertension Unknown   . Stomach cancer Paternal Grandmother   . COPD Brother   . Arthritis Brother   . Heart failure Brother   . Cancer Brother        lung   . Arthritis Daughter   . Colon cancer Neg Hx   . Esophageal cancer Neg Hx   . Colon polyps Neg Hx   . Ulcerative colitis Neg Hx    Past Surgical History:  Procedure Laterality Date  . BIV ICD INSERTION CRT-D N/A 03/27/2017   Procedure: BIV ICD INSERTION CRT-D;  Surgeon: Constance Haw, MD;  Location: [facility];  Service: Cardiovascular;  Laterality: N/A;  . CARDIAC CATHETERIZATION     '05, last 2009, showing patent RCA stent  . COLONOSCOPY  12/2007   HYPERPLASTIC POLYP  . COLONOSCOPY WITH PROPOFOL N/A 06/02/2014   Procedure: COLONOSCOPY WITH PROPOFOL;  Surgeon: [name], MD;  Location: [facility];  Service: Endoscopy;  Laterality: N/A;  . CORONARY ANGIOPLASTY  08/2003  . ESOPHAGOGASTRODUODENOSCOPY (EGD) WITH PROPOFOL N/A 06/02/2014   Procedure: ESOPHAGOGASTRODUODENOSCOPY (EGD)  WITH PROPOFOL;  Surgeon: [name], MD;  Location: [facility];  Service: Endoscopy;  Laterality: N/A;  . hernia surgery x 3     Bilateral Inguinal, Umbicial  . IRRIGATION AND DEBRIDEMENT ABSCESS Left 08/14/2012   Procedure: IRRIGATION AND DEBRIDEMENT LEFT INGUINAL BOIL ;  Surgeon: Ailene Rud, MD;  Location: [facility];  Service: Urology;  Laterality: Left;  . JOINT REPLACEMENT Right 2017  . KNEE ARTHROPLASTY    . LEFT HEART CATHETERIZATION WITH CORONARY ANGIOGRAM N/A 05/26/2014   Procedure: LEFT HEART CATHETERIZATION WITH CORONARY ANGIOGRAM;  Surgeon: Blane Ohara, MD;  Location: [facility];  Service: Cardiovascular;  Laterality: N/A;  . LUMBAR LAMINECTOMY/DECOMPRESSION MICRODISCECTOMY Left 09/26/2014   Procedure: Lumbar Laminectomy for resection of synovial cyst  Lumbar five- sacral one left;  Surgeon: Kary Kos, MD;  Location: [facility];  Service: Neurosurgery;  Laterality: Left;  . Oral surg to  removed growth from ?sinus  10/2010   ?Dermoid removed by DrRiggs  . PACEMAKER INSERTION  04/03/2017  . POLYPECTOMY    . RCA stenting     '05 RCA  . right toe surgery  Right    Cyst   . RIGHT/LEFT HEART CATH AND CORONARY ANGIOGRAPHY N/A 01/15/2017   Procedure: RIGHT/LEFT HEART CATH AND CORONARY ANGIOGRAPHY;  Surgeon: [name], MD;  Location: [facility];  Service: Cardiovascular;  Laterality: N/A;  . s/p right knee arthroscopy  2005  . TOTAL KNEE ARTHROPLASTY Right 03/12/2016   Procedure: RIGHT TOTAL KNEE ARTHROPLASTY;  Surgeon: Paralee Cancel, MD;  Location: [facility];  Service: Orthopedics;  Laterality: Right;  . UPPER GASTROINTESTINAL ENDOSCOPY     Social History   Social History Narrative  . Not on file    Objective: Vital Signs: BP 126/73 (BP Location: Left Arm, Patient Position: Sitting, Cuff Size: Normal)   Pulse 77   Resp 14   Ht 5\' 8"  (1.727 m)   Wt 188 lb 3.2 oz (85.4 kg)   BMI 28.62 kg/m    Physical Exam  Constitutional: He is oriented to person,  place, and time. He appears well-developed and well-nourished.  HENT:  Head: Normocephalic and atraumatic.  Eyes: Pupils are equal, round, and reactive to light. Conjunctivae and EOM are normal.  Neck: Normal range of motion. Neck supple.  Cardiovascular: Normal rate, regular rhythm and normal heart sounds.  Pacemaker  Pulmonary/Chest: Effort normal and breath sounds normal.  Abdominal: Soft. Bowel sounds are normal.  Lymphadenopathy:    He has no cervical adenopathy.  Neurological: He is alert and oriented to person, place, and time.  Skin: Skin is warm and dry. Capillary refill takes less than 2 seconds.  Psychiatric: He has a normal mood and affect. His behavior is normal.  Nursing note and vitals reviewed.    Musculoskeletal Exam: C-spine, thoracic spine, and lumbar spine good ROM.  No midline spinal tenderness.  Bilateral SI joint tenderness.  Shoulder joints, elbow joints, wrist joints, MCPs, PIPs, and DIPs good ROM with no synovitis.  PIP and DIP synovial thickening.  1st and 2nd MCP synovial thickening bilaterally.  Hip joints, ankle joints, MTPs, PIPs, and DIPs good ROM with no synovitis. Right knee replacement limited extension and warmth on exam.  Left knee mild warmth.    CDAI Exam: CDAI Score: Not documented Patient Global Assessment: Not documented; Provider Global Assessment: Not documented Swollen: Not documented; Tender: Not documented Joint Exam   Not documented   There is currently no information documented on the homunculus. Go to the Rheumatology activity and complete the homunculus joint exam.  Investigation: No additional findings.  Imaging: No results found.  Recent Labs: Lab Results  Component Value Date   WBC 5.3 12/23/2017   HGB 14.4 12/23/2017   PLT 226 12/23/2017   NA 140 12/31/2017   K 4.4 12/31/2017   CL 105 12/31/2017   CO2 25 12/31/2017   GLUCOSE 130 (H) 12/31/2017   BUN 21 12/31/2017   CREATININE 1.03 12/31/2017   BILITOT 0.7  12/23/2017   ALKPHOS 78 10/23/2016   AST 22 12/23/2017   ALT 19 12/23/2017   PROT 6.3 12/23/2017   ALBUMIN 4.4 10/23/2016   CALCIUM 9.9 12/31/2017   GFRAA 88 12/23/2017    Speciality Comments: No specialty comments available.  Procedures:  No procedures performed Allergies: Sulfonamide derivatives   Assessment / Plan:     Visit Diagnoses: Inflammatory arthritis - CCP +, RF -,  14-3-3 eta negative, Sed rate WNL:  He has no synovitis on exam.  He has significant PIP and DIP synovial thickening in both hands.  He has noticed significant benefit since starting on PLQ 200 mg 1 tablet daily M-F.  His morning stiffness has improved as well as the pain in both hands and feet.  He will continue on PLQ 200 mg 1 tablet daily M-F.  He is going to schedule a baseline eye exam. A refill of PLQ was sent to the pharmacy.  He was advised to notify us if he develops increased joint pain or joint swelling.  He will return to the office in 5 months.   High risk medication use - PLQ 200 mg 1 tablet by mouth daily M-F. Glucose was 129 and all other labs were WNL on 12/23/17. We will continue to monitor lab work. He would like to have labs at office visits.    Primary osteoarthritis of left knee: He has mild warmth of the left knee joint.  He plans on having a left partial knee replacement in January 2020.    Primary osteoarthritis of both hands: He has significant PIP and DIP synovial thickening.  CMC joint synovial thickening.  No synovitis or tenderness on exam.  Joint protection and muscle strengthening were discussed.   Primary osteoarthritis of both feet: He has PIP and DIP synovial thickening consistent with osteoarthritis of both feet.  He has noticed decreased pain in both feet since starting on PLQ.   Status post right knee replacement: Chronic pain.  He has limited extension with discomfort.  Warmth noted on exam.   DDD (degenerative disc disease), lumbar: No midline spinal tenderness.  He used to  follow up with Dr. Weston Settle.  Other medical conditions are listed as follows:   History of CHF (congestive heart failure)  History of hypertension  History of BPH  History of sleep apnea  History of colon polyps  History of gastric polyp  History of hyperlipidemia  History of coronary artery disease  History of gastroesophageal reflux (GERD)   Orders: No orders of the defined types were placed in this encounter.  Meds ordered this encounter  Medications  . hydroxychloroquine (PLAQUENIL) 200 MG tablet    Sig: Take one tablet (200 mg) twice daily Monday through Friday only. Do not take Saturday or Sundays.    Dispense:  120 tablet    Refill:  0      Follow-Up Instructions: Return in about 5 months (around 07/07/2018).   Ofilia Neas, PA-C  Note - This record has been created using Dragon software.  Chart creation errors have been sought, but may not always  have been located. Such creation errors do not reflect on  the standard of medical care.

## 2018-01-22 NOTE — Telephone Encounter (Signed)
Called patient and left message for him to call back to schedule. Okay to schedule as an OFFICE VISIT with VISIT NOTE: Est care/ Trans from Dr Lenna Gilford okay per Dr Jenny Reichmann.

## 2018-02-04 ENCOUNTER — Encounter: Payer: Self-pay | Admitting: Physician Assistant

## 2018-02-04 ENCOUNTER — Ambulatory Visit (INDEPENDENT_AMBULATORY_CARE_PROVIDER_SITE_OTHER): Payer: Medicare Other | Admitting: Physician Assistant

## 2018-02-04 VITALS — BP 126/73 | HR 77 | Resp 14 | Ht 68.0 in | Wt 188.2 lb

## 2018-02-04 DIAGNOSIS — M19072 Primary osteoarthritis, left ankle and foot: Secondary | ICD-10-CM

## 2018-02-04 DIAGNOSIS — M19042 Primary osteoarthritis, left hand: Secondary | ICD-10-CM

## 2018-02-04 DIAGNOSIS — Z8669 Personal history of other diseases of the nervous system and sense organs: Secondary | ICD-10-CM

## 2018-02-04 DIAGNOSIS — Z96651 Presence of right artificial knee joint: Secondary | ICD-10-CM

## 2018-02-04 DIAGNOSIS — Z86010 Personal history of colonic polyps: Secondary | ICD-10-CM | POA: Diagnosis not present

## 2018-02-04 DIAGNOSIS — M19041 Primary osteoarthritis, right hand: Secondary | ICD-10-CM | POA: Diagnosis not present

## 2018-02-04 DIAGNOSIS — M1990 Unspecified osteoarthritis, unspecified site: Secondary | ICD-10-CM

## 2018-02-04 DIAGNOSIS — M5136 Other intervertebral disc degeneration, lumbar region: Secondary | ICD-10-CM

## 2018-02-04 DIAGNOSIS — M1712 Unilateral primary osteoarthritis, left knee: Secondary | ICD-10-CM | POA: Diagnosis not present

## 2018-02-04 DIAGNOSIS — Z79899 Other long term (current) drug therapy: Secondary | ICD-10-CM

## 2018-02-04 DIAGNOSIS — Z87438 Personal history of other diseases of male genital organs: Secondary | ICD-10-CM

## 2018-02-04 DIAGNOSIS — M19071 Primary osteoarthritis, right ankle and foot: Secondary | ICD-10-CM | POA: Diagnosis not present

## 2018-02-04 DIAGNOSIS — Z8679 Personal history of other diseases of the circulatory system: Secondary | ICD-10-CM

## 2018-02-04 DIAGNOSIS — Z8719 Personal history of other diseases of the digestive system: Secondary | ICD-10-CM | POA: Diagnosis not present

## 2018-02-04 DIAGNOSIS — Z8639 Personal history of other endocrine, nutritional and metabolic disease: Secondary | ICD-10-CM

## 2018-02-04 MED ORDER — HYDROXYCHLOROQUINE SULFATE 200 MG PO TABS: ORAL_TABLET | ORAL | 0 refills | Status: DC

## 2018-02-05 ENCOUNTER — Encounter: Payer: Self-pay | Admitting: Physician Assistant

## 2018-02-10 ENCOUNTER — Telehealth: Payer: Self-pay | Admitting: Cardiology

## 2018-02-10 LAB — CUP PACEART REMOTE DEVICE CHECK
Battery Remaining Longevity: 76 mo
Battery Voltage: 3.01 V
Brady Statistic AS VP Percent: 90.72 %
Brady Statistic RA Percent Paced: 4.41 %
Brady Statistic RV Percent Paced: 34.52 %
HighPow Impedance: 64 Ohm
Implantable Lead Implant Date: 20181108
Implantable Lead Implant Date: 20181108
Implantable Lead Location: 753858
Implantable Lead Location: 753860
Implantable Lead Model: 4298
Implantable Lead Model: 5076
Implantable Pulse Generator Implant Date: 20181108
Lead Channel Impedance Value: 172.541
Lead Channel Impedance Value: 176 Ohm
Lead Channel Impedance Value: 249.375
Lead Channel Impedance Value: 256.667
Lead Channel Impedance Value: 304 Ohm
Lead Channel Impedance Value: 399 Ohm
Lead Channel Impedance Value: 418 Ohm
Lead Channel Impedance Value: 418 Ohm
Lead Channel Impedance Value: 589 Ohm
Lead Channel Impedance Value: 703 Ohm
Lead Channel Impedance Value: 988 Ohm
Lead Channel Impedance Value: 988 Ohm
Lead Channel Pacing Threshold Amplitude: 0.5 V
Lead Channel Pacing Threshold Amplitude: 0.625 V
Lead Channel Pacing Threshold Pulse Width: 0.4 ms
Lead Channel Pacing Threshold Pulse Width: 0.5 ms
Lead Channel Sensing Intrinsic Amplitude: 3.125 mV
Lead Channel Sensing Intrinsic Amplitude: 3.125 mV
Lead Channel Setting Pacing Amplitude: 2 V
Lead Channel Setting Pacing Amplitude: 4.25 V
Lead Channel Setting Sensing Sensitivity: 0.3 mV
MDC IDC LEAD IMPLANT DT: 20181108
MDC IDC LEAD LOCATION: 753859
MDC IDC MSMT LEADCHNL LV IMPEDANCE VALUE: 208.627
MDC IDC MSMT LEADCHNL LV IMPEDANCE VALUE: 513 Ohm
MDC IDC MSMT LEADCHNL LV IMPEDANCE VALUE: 608 Ohm
MDC IDC MSMT LEADCHNL LV IMPEDANCE VALUE: 665 Ohm
MDC IDC MSMT LEADCHNL LV IMPEDANCE VALUE: 893 Ohm
MDC IDC MSMT LEADCHNL LV PACING THRESHOLD AMPLITUDE: 3.25 V
MDC IDC MSMT LEADCHNL RA PACING THRESHOLD PULSEWIDTH: 0.4 ms
MDC IDC MSMT LEADCHNL RV IMPEDANCE VALUE: 589 Ohm
MDC IDC MSMT LEADCHNL RV SENSING INTR AMPL: 14.375 mV
MDC IDC MSMT LEADCHNL RV SENSING INTR AMPL: 14.375 mV
MDC IDC SESS DTM: 20190821083725
MDC IDC SET LEADCHNL LV PACING PULSEWIDTH: 0.5 ms
MDC IDC SET LEADCHNL RV PACING AMPLITUDE: 2.5 V
MDC IDC SET LEADCHNL RV PACING PULSEWIDTH: 0.4 ms
MDC IDC STAT BRADY AP VP PERCENT: 4.42 %
MDC IDC STAT BRADY AP VS PERCENT: 0.06 %
MDC IDC STAT BRADY AS VS PERCENT: 4.8 %

## 2018-02-10 NOTE — Telephone Encounter (Signed)
Remote transmission reviewed. Presenting rhythm: AsVp. No atrial or ventricular arrhythmias recorded. Stable lead measurements. No alerts recorded. Normal device function.   Called and informed patient that his transmission was received. No alerts noted. I explained to patient that he may have heard the alert d/t a magnet being too close to his device. I explained to patient the kinds of tones he could hear. I told patient to call back if he ever hears the "high urgency" tone. Patient verbalized understanding.

## 2018-02-10 NOTE — Telephone Encounter (Signed)
Patient called and stated that he heard an alert tone from his ICD. Instructed pt to send a remote transmission. Pt is unsure how to do this. He is going to call back after 4:00 PM for instructions.

## 2018-02-10 NOTE — Telephone Encounter (Signed)
Spoke w/ pt and instructed him how to send a remote transmission. Informed him that a Device Tech RN will review and call back with the results.

## 2018-02-20 DIAGNOSIS — Z23 Encounter for immunization: Secondary | ICD-10-CM | POA: Diagnosis not present

## 2018-02-23 DIAGNOSIS — Z79899 Other long term (current) drug therapy: Secondary | ICD-10-CM | POA: Diagnosis not present

## 2018-03-03 ENCOUNTER — Other Ambulatory Visit: Payer: Self-pay | Admitting: Physician Assistant

## 2018-03-10 ENCOUNTER — Encounter (HOSPITAL_COMMUNITY): Payer: Self-pay

## 2018-03-10 NOTE — Pre-Procedure Instructions (Signed)
JAQUAE RIEVES  03/10/2018      [facility] - [place], [place] - [address] [address] Phone: [phone] Fax: [phone]  [place], [place] [address] Phone: [phone] Fax: [phone]  [id number] [place], [place] - [address] AT [facility] [address] [place] [address] Phone: [phone] Fax: [phone]    Your procedure is scheduled on Friday November 1.  Report to [facility] Admitting at 5:30 A.M.  Call this number if you have problems the morning of surgery:  [phone]   Remember:  Do not eat or drink after midnight.  You may drink clear liquids until 4:30 AM .  Clear liquids allowed are: Water, Carbonated beverages, Clear Tea, Black Coffee only and Gatorade    Take these medicines the morning of surgery with A SIP OF WATER:   Carvedilol (coreg) Pantoprazole (protonix) Hydroxychloroquine (Plaquenil) Eye drops if needed Tramadol (ultram) if needed  7 days prior to surgery STOP taking any Diclofenac (Voltaren), relafen (Nabumetone), Aleve, Naproxen, Ibuprofen, Motrin, Advil, Goody's, BC's, all herbal medications, fish oil, and all vitamins  Please bring your CPAP mask and tubing to the hospital with you.      Do not wear jewelry  Do not wear lotions, powders, or colognes, or deodorant.  Do not shave 48 hours prior to surgery.  Men may shave face and neck.  Do not bring valuables to the hospital.  [facility] is not responsible for any belongings or valuables.  Contacts, dentures or bridgework may not be worn into surgery.  Leave your suitcase in the car.  After surgery it may be brought to your room.  For patients admitted to the hospital, discharge time will be determined by your treatment team.  Patients  discharged the day of surgery will not be allowed to drive home.   Special instructions:    [facility]- Preparing For Surgery  Before surgery, you can play an important role. Because skin is not sterile, your skin needs to be as free of germs as possible. You can reduce the number of germs on your skin by washing with CHG (chlorahexidine gluconate) Soap before surgery.  CHG is an antiseptic cleaner which kills germs and bonds with the skin to continue killing germs even after washing.    Oral Hygiene is also important to reduce your risk of infection.  Remember - BRUSH YOUR TEETH THE MORNING OF SURGERY WITH YOUR REGULAR TOOTHPASTE  Please do not use if you have an allergy to CHG or antibacterial soaps. If your skin becomes reddened/irritated stop using the CHG.  Do not shave (including legs and underarms) for at least 48 hours prior to first CHG shower. It is OK to shave your face.  Please follow these instructions carefully.   1. Shower the NIGHT BEFORE SURGERY and the MORNING OF SURGERY with CHG.   2. If you chose to wash your hair, wash your hair first as usual with your normal shampoo.  3. After you shampoo, rinse your hair and body thoroughly to remove the shampoo.  4. Use CHG as you would any other liquid soap. You can apply CHG directly to the skin and wash gently with a scrungie or a clean washcloth.   5. Apply the CHG Soap to your body ONLY  FROM THE NECK DOWN.  Do not use on open wounds or open sores. Avoid contact with your eyes, ears, mouth and genitals (private parts). Wash Face and genitals (private parts)  with your normal soap.  6. Wash thoroughly, paying special attention to the area where your surgery will be performed.  7. Thoroughly rinse your body with warm water from the neck down.  8. DO NOT shower/wash with your normal soap after using and rinsing off the CHG Soap.  9. Pat yourself dry with a CLEAN TOWEL.  10. Wear CLEAN PAJAMAS to bed the night before  surgery, wear comfortable clothes the morning of surgery  11. Place CLEAN SHEETS on your bed the night of your first shower and DO NOT SLEEP WITH PETS.    Day of Surgery:  Do not apply any deodorants/lotions.  Please wear clean clothes to the hospital/surgery center.   Remember to brush your teeth WITH YOUR REGULAR TOOTHPASTE.    Please read over the following fact sheets that you were given. Coughing and Deep Breathing and Surgical Site Infection Prevention

## 2018-03-12 ENCOUNTER — Encounter (HOSPITAL_COMMUNITY): Payer: Self-pay

## 2018-03-12 ENCOUNTER — Encounter (HOSPITAL_COMMUNITY)
Admission: RE | Admit: 2018-03-12 | Discharge: 2018-03-12 | Disposition: A | Discharge status: Home / Self Care | Payer: Medicare Other | Source: Ambulatory Visit | Attending: Surgery | Admitting: Surgery

## 2018-03-12 ENCOUNTER — Other Ambulatory Visit: Payer: Self-pay

## 2018-03-12 DIAGNOSIS — Z01812 Encounter for preprocedural laboratory examination: Secondary | ICD-10-CM | POA: Insufficient documentation

## 2018-03-12 HISTORY — DX: Malignant (primary) neoplasm, unspecified: C80.1

## 2018-03-12 HISTORY — DX: Prediabetes: R73.03

## 2018-03-12 HISTORY — DX: Presence of automatic (implantable) cardiac defibrillator: Z95.810

## 2018-03-12 LAB — BASIC METABOLIC PANEL
ANION GAP: 7 (ref 5–15)
BUN: 19 mg/dL (ref 8–23)
CALCIUM: 9.6 mg/dL (ref 8.9–10.3)
CO2: 22 mmol/L (ref 22–32)
Chloride: 109 mmol/L (ref 98–111)
Creatinine, Ser: 0.94 mg/dL (ref 0.61–1.24)
GFR calc non Af Amer: >60 mL/min (ref >60)
Glucose, Bld: 104 mg/dL — ABNORMAL HIGH (ref 70–99)
Potassium: 4.5 mmol/L (ref 3.5–5.1)
SODIUM: 138 mmol/L (ref 135–145)

## 2018-03-12 LAB — CBC
HCT: 47.8 % (ref 39.0–52.0)
Hemoglobin: 15.1 g/dL (ref 13.0–17.0)
MCH: 29.4 pg (ref 26.0–34.0)
MCHC: 31.6 g/dL (ref 30.0–36.0)
MCV: 93.2 fL (ref 80.0–100.0)
PLATELETS: 244 10*3/uL (ref 150–400)
RBC: 5.13 MIL/uL (ref 4.22–5.81)
RDW: 12.4 % (ref 11.5–15.5)
WBC: 5.5 10*3/uL (ref 4.0–10.5)
nRBC: 0 % (ref 0.0–0.2)

## 2018-03-12 NOTE — Progress Notes (Addendum)
PCP  Dr. Teressa Lower  Cardiologist Dr. [name]  [employer]  [facility]   Dr. Gretta Began Device orders faxed to [facility]. [employer] Reps notified of date time of surgery via e-Mail.  Echo 06-25-2017  Stress test  12-04-2016  Cath  01-15-2017

## 2018-03-13 NOTE — Anesthesia Preprocedure Evaluation (Addendum)
Anesthesia Evaluation  Patient identified by MRN, date of birth, ID band Patient awake    Reviewed: Allergy & Precautions, NPO status , Patient's Chart, lab work & pertinent test results  Airway Mallampati: III  TM Distance: >3 FB Neck ROM: Limited    Dental no notable dental hx. (+) Teeth Intact, Dental Advisory Given   Pulmonary sleep apnea and Continuous Positive Airway Pressure Ventilation , former smoker,    Pulmonary exam normal breath sounds clear to auscultation       Cardiovascular hypertension, Pt. on medications and Pt. on home beta blockers + CAD, + Cardiac Stents (2006) and +CHF  Normal cardiovascular exam+ pacemaker + Cardiac Defibrillator  Rhythm:Regular Rate:Normal  TTE 06/2017 - Left ventricle: The cavity size was normal. Systolic function was normal. The estimated ejection fraction was in the range of 55% to 60%. Wall motion was normal; there were no regional wall motion abnormalities. Doppler parameters are consistent with abnormal left ventricular relaxation (grade 1 diastolic dysfunction). - Ventricular septum: Septal motion showed dyssynergy. These changes are consistent with a left bundle branch block. - Aortic valve: There was trivial regurgitation. - Atrial septum: There was increased thickness of the septum,consistent with lipomatous hypertrophy.  RHC/LHC 2018 1. Widely patent coronary arteries with continued patency of the stented segment in the right coronary artery 2. Mild nonobstructive coronary artery disease as detailed with mild stenosis of the proximal to mid LAD and otherwise minimal luminal irregularities 3. Well compensated right-sided cardiac hemodynamics   Neuro/Psych negative neurological ROS  negative psych ROS   GI/Hepatic Neg liver ROS, GERD  Medicated,  Endo/Other  negative endocrine ROS  Renal/GU negative Renal ROS  negative genitourinary   Musculoskeletal  (+) Arthritis ,  Osteoarthritis,    Abdominal   Peds  Hematology negative hematology ROS (+)   Anesthesia Other Findings Ventral hernia  Reproductive/Obstetrics                           Anesthesia Physical Anesthesia Plan  ASA: III  Anesthesia Plan: General   Post-op Pain Management:    Induction: Intravenous  PONV Risk Score and Plan: 2 and Treatment may vary due to age or medical condition and Ondansetron  Airway Management Planned: Oral ETT and Video Laryngoscope Planned  Additional Equipment:   Intra-op Plan:   Post-operative Plan: Extubation in OR  Informed Consent: I have reviewed the patients History and Physical, chart, labs and discussed the procedure including the risks, benefits and alternatives for the proposed anesthesia with the patient or authorized representative who has indicated his/her understanding and acceptance.   Dental advisory given  Plan Discussed with: CRNA  Anesthesia Plan Comments: (Cardiac clearance by Dr. Copper 01/02/2018: "Preoperative surgical clearance: The patient's functional status is greatly improved since he underwent biventricular pacemaker placement.  His LV function has normalized.  He had patent coronary arteries at his most recent catheterization.  I think he can proceed with surgery at low risk of cardiac complications." Device orders on pt chart.)      Anesthesia Quick Evaluation

## 2018-03-19 MED ORDER — BUPIVACAINE LIPOSOME 1.3 % IJ SUSP
20.0000 mL | INTRAMUSCULAR | Status: AC
  Administered 2018-03-20: 20 mL
  Filled 2018-03-19: qty 20

## 2018-03-19 NOTE — H&P (View-Only) (Signed)
[name] Location: [facility] Patient #: [id number] DOB: [birth year] Married / Language: English / Race: White Male  History of Present Illness    The patient is a 72 year old male who presents with a complaint of hernia.  The PCP is Dr. Jeannine Kitten  The patient was referred by Dr. Jeannine Kitten  Cardiology - Dr. [name] He comes by himself.  He had an epigastric hernia repaired over 20 years ago. He is unsure who did the surgery, but they used mesh. He saw Dr. Annamaria Boots at one point to have the hernia repaired again, but decided not to becuase it sounded more difficult to repair that was worth it. Over the last few years the hernia has become larger. It bothers him more. He will not take his shirt off at the beach. And now he has a knot that he can feel all the time. He has no history of ulcer disease, liver disease, gallbladder disease, and colon disease. His last colonoscopy was about 4 years ago by Dr. Ardis Hughs. He has had some polyps removed in the past. He has had both inguinal hernias repaired in the past. He thinks he had the left side done once and the right side done twice. Dr. Kennis Carina was involved with at least a couple of the hernia repairs.  I discussed the indications and complications of hernia surgery with the patient. I discussed both the laparoscopic and open approach to hernia repair.. The potential risks of hernia surgery include, but are not limited to, bleeding, infection, open surgery, nerve injury, and recurrence of the hernia. I provided the patient literature about hernia surgery.  Plan: 1) Cardiac clearance with Dr. Burt Knack (whom he is going to see later today), 2) Laparoscopic repair of recurrent ventral hernia, 3) He has a dark left arm mole that I can remvoe at the same time.  Past Medical History: 1. History of chronic systolic heart failure He is seeing Dr. Burt Knack this afternoon 2. Coronary  artery disease - last cath Nov 2018 status post stenting of the RCA, Left bundle branch block. Dr. Allegra Lai - cardiology 3. OSA - CPAP at night.  4. Medtronic CRT-D pacemaker implanted 03/27/17. Implant was comp gated by microperforation.  He was put on colchicine and that greatly improved his symptoms. 5. Lumbar laminectomy - Cram - 2016 6. 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 He is considering replacing his left knee - I think he is seeing Dr. Sharol Given for this 7. On Flomax sees Dr. Diona Fanti now 8. Left inguinal infection - 2014 - Tannenbaum 9. Dermatology - Dr. Jarome Matin 10. Dr. 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 HTN 12. Chronic pain - he takes one Tramadol a day 13. Last colonoscopy - 2015 - Dr. Rande Brunt  Social History: He splits his time between [place], [place] (near [place] and [place] His wife lives full time in [place]. He has one daughter, 52 yo.  He still works English as a second language teacher. His company is [employer] (he works with Marga [employer]oots)   Past Surgical History (Tanisha A. Owens Shark, RMA; 01/02/2018 1:48 PM) Colon Polyp Removal - Colonoscopy  Knee Surgery  Bilateral. Open Inguinal Hernia Surgery  Bilateral. Oral Surgery  Spinal Surgery - Lower Back  Vasectomy  Ventral / Umbilical Hernia Surgery  Bilateral.  Diagnostic Studies History (Tanisha A. Owens Shark, [place]; 01/02/2018 1:48 PM) Colonoscopy  1-5 years ago  Allergies (  Tanisha A. Owens Shark, [place]; 01/02/2018 1:49 PM) Sulfa Antibiotics  Allergies Reconciled   Medication History (Tanisha A. Owens Shark, [place]; 01/02/2018 1:53 PM) Carvedilol (25MG  Tablet, Oral) Active. Hydroxychloroquine Sulfate (200MG  Tablet, Oral) Active. Nabumetone (500MG  Tablet, Oral) Active. Atorvastatin Calcium (20MG  Tablet, Oral) Active. Pantoprazole Sodium (40MG  Tablet DR, Oral) Active. Cinnamon  (Oral) Specific strength unknown - Active. TraMADol HCl (50MG  Tablet, Oral) Active. Sacubitril-Valsartan (49-51MG  Tablet, Oral) Active. Spironolactone (25MG  Tablet, Oral) Active. Glucosamine (Oral) Specific strength unknown - Active. Multi-Vitamin (Oral) Active. Calcium Carbonate (1500 (600 Ca)MG Tablet, Oral) Active. Omega 3-6-9 (Oral) Active. Saw Palmetto (Oral) Specific strength unknown - Active. Aspirin (81MG  Tablet, Oral) Active. Tamsulosin HCl (0.4MG  Capsule, Oral) Active. Turmeric (Oral) Specific strength unknown - Active. Probiotic (Oral) Active. Medications Reconciled  Social History (Tanisha A. Owens Shark, [place]; 01/02/2018 1:48 PM) Alcohol use  Occasional alcohol use. Caffeine use  Carbonated beverages, Tea. No drug use  Tobacco use  Former smoker.  Family History (Tanisha A. Owens Shark, [place]; 01/02/2018 1:48 PM) Arthritis  Brother, Daughter, Father, Mother. Depression  Brother. Heart Disease  Brother, Father, Mother. Heart disease in male family member before age 75  Heart disease in male family member before age 78  Hypertension  Brother, Father, Mother. Migraine Headache  Brother. Respiratory Condition  Brother.  Other Problems (Tanisha A. Owens Shark, [place]; 01/02/2018 1:48 PM) Arthritis  Back Pain  Chest pain  Congestive Heart Failure  Gastroesophageal Reflux Disease  High blood pressure  Hypercholesterolemia  Inguinal Hernia  Melanoma  Sleep Apnea  Umbilical Hernia Repair  Ventral Hernia Repair     Review of Systems (Tanisha A. Brown RMA; 01/02/2018 1:48 PM) General Not Present- Appetite Loss, Chills, Fatigue, Fever, Night Sweats, Weight Gain and Weight Loss. Skin Not Present- Change in Wart/Mole, Dryness, Hives, Jaundice, New Lesions, Non-Healing Wounds, Rash and Ulcer. HEENT Present- Wears glasses/contact lenses. Not Present- Earache, Hearing Loss, Hoarseness, Nose Bleed, Oral Ulcers, Ringing in the Ears, Seasonal Allergies, Sinus  Pain, Sore Throat, Visual Disturbances and Yellow Eyes. Respiratory Present- Snoring and Wheezing. Not Present- Bloody sputum, Chronic Cough and Difficulty Breathing. Breast Not Present- Breast Mass, Breast Pain, Nipple Discharge and Skin Changes. Cardiovascular Present- Chest Pain, Leg Cramps, Rapid Heart Rate and Shortness of Breath. Not Present- Difficulty Breathing Lying Down, Palpitations and Swelling of Extremities. Gastrointestinal Not Present- Abdominal Pain, Bloating, Bloody Stool, Change in Bowel Habits, Chronic diarrhea, Constipation, Difficulty Swallowing, Excessive gas, Gets full quickly at meals, Hemorrhoids, Indigestion, Nausea, Rectal Pain and Vomiting. Male Genitourinary Present- Frequency, Urgency and Urine Leakage. Not Present- Blood in Urine, Change in Urinary Stream, Impotence, Nocturia and Painful Urination. Musculoskeletal Present- Back Pain, Joint Pain, Joint Stiffness and Swelling of Extremities. Not Present- Muscle Pain and Muscle Weakness. Neurological Not Present- Decreased Memory, Fainting, Headaches, Numbness, Seizures, Tingling, Tremor, Trouble walking and Weakness. Psychiatric Not Present- Anxiety, Bipolar, Change in Sleep Pattern, Depression, Fearful and Frequent crying. Endocrine Not Present- Cold Intolerance, Excessive Hunger, Hair Changes, Heat Intolerance, Hot flashes and New Diabetes. Hematology Present- Blood Thinners. Not Present- Easy Bruising, Excessive bleeding, Gland problems, HIV and Persistent Infections.  Vitals (Tanisha A. Brown RMA; 01/02/2018 1:49 PM) 01/02/2018 1:48 PM Weight: 186.4 lb Height: 68in Body Surface Area: 1.98 m Body Mass Index: 28.34 kg/m  Temp.: 98.40F  BP: 132/82 (Sitting, Left Arm, Standard)  Physical Exam  General: WN WM alert and generally healthy appearing. Skin: Inspection and palpation of the skin unremarkable.  Eyes: Conjunctivae white, pupils equal. Face, ears, nose, mouth, and throat: Face - normal.  Normal ears and  nose. Lips and teeth normal.  Neck: Supple. No mass. Trachea midline. No thyroid mass.  Lymph Nodes: No supraclavicular or cervical adenopathy. No axillary adenopathy.  Lungs: Normal respiratory effort. Clear to auscultation and symmetric breath sounds. Cardiovascular: Regular rate and rythm. Normal auscultation of the heart. No murmur or rub.   He has a pacemaker in the left uppper chest.  Abdomen: Soft. No mass. Liver and spleen not palpable. No tenderness. No hernia. Normal bowel sounds.  He has a diastasis recti Immediately above his umbilicus, at an old scar, he has a hernia. The bulge is about 4 cm, but the fascial defect is hard to feel. Rectal: Not done.  Musculoskeletal/extremities: Normal gait. Good strength and ROM in upper and lower extremities.  Complains of pain in his hands from his arthritis.  Neurologic: Grossly intact to motor and sensory function.   Psychiatric: Has normal mood and affect. Judgement and insight appear normal.   Assessment & Plan  1.  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 Lucia Gaskins MD; 01/06/2018 1:43 PM)  cardiac clearance by Dr. Burt Knack on 01/02/2018   2) Laparoscopic repair of ventral incisional hernia  2.  Mole on left arm  To remove during surgery  3.  CONGESTIVE HEART FAILURE, CHRONIC, LEFT-SIDED (I50.1)       He is seeing Dr. Burt Knack this afternoon 4.  OBSTRUCTIVE SLEEP APNEA (G47.33)  CPAP at night.  5.  PACEMAKER (Z95.0)  Medtronic CRT-D pacemaker implanted 03/27/17.   6. Coronary artery disease - last cath Nov 2018 status post stenting of the RCA, Left bundle branch block. Dr. Allegra Lai - cardiology 7. 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 He is considering replacing his left knee - I think he is seeing Dr. Sharol Given for this 8. On  Flomax sees Dr. Diona Fanti now 9. Dr. 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 HTN 11. Chronic pain - he takes one Tramadol a day   Alphonsa Overall, MD, [facility] Pager: [phone] Office phone:  [phone]

## 2018-03-19 NOTE — Progress Notes (Signed)
[name] Location: [facility] Patient #: [id number] DOB: [birth year] Married / Language: English / Race: White Male  History of Present Illness    The patient is a 72 year old male who presents with a complaint of hernia.  The PCP is Dr. Jeannine Kitten  The patient was referred by Dr. Jeannine Kitten  Cardiology - Dr. [name] He comes by himself.  He had an epigastric hernia repaired over 20 years ago. He is unsure who did the surgery, but they used mesh. He saw Dr. Annamaria Boots at one point to have the hernia repaired again, but decided not to becuase it sounded more difficult to repair that was worth it. Over the last few years the hernia has become larger. It bothers him more. He will not take his shirt off at the beach. And now he has a knot that he can feel all the time. He has no history of ulcer disease, liver disease, gallbladder disease, and colon disease. His last colonoscopy was about 4 years ago by Dr. Ardis Hughs. He has had some polyps removed in the past. He has had both inguinal hernias repaired in the past. He thinks he had the left side done once and the right side done twice. Dr. Kennis Carina was involved with at least a couple of the hernia repairs.  I discussed the indications and complications of hernia surgery with the patient. I discussed both the laparoscopic and open approach to hernia repair.. The potential risks of hernia surgery include, but are not limited to, bleeding, infection, open surgery, nerve injury, and recurrence of the hernia. I provided the patient literature about hernia surgery.  Plan: 1) Cardiac clearance with Dr. Burt Knack (whom he is going to see later today), 2) Laparoscopic repair of recurrent ventral hernia, 3) He has a dark left arm mole that I can remvoe at the same time.  Past Medical History: 1. History of chronic systolic heart failure He is seeing Dr. Burt Knack this afternoon 2. Coronary  artery disease - last cath Nov 2018 status post stenting of the RCA, Left bundle branch block. Dr. Allegra Lai - cardiology 3. OSA - CPAP at night.  4. Medtronic CRT-D pacemaker implanted 03/27/17. Implant was comp gated by microperforation.  He was put on colchicine and that greatly improved his symptoms. 5. Lumbar laminectomy - Cram - 2016 6. 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 He is considering replacing his left knee - I think he is seeing Dr. Sharol Given for this 7. On Flomax sees Dr. Diona Fanti now 8. Left inguinal infection - 2014 - Tannenbaum 9. Dermatology - Dr. Jarome Matin 10. Dr. 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 HTN 12. Chronic pain - he takes one Tramadol a day 13. Last colonoscopy - 2015 - Dr. Rande Brunt  Social History: He splits his time between [place], [place] (near [place]), and [place] His wife lives full time in [place]. He has one daughter, 9 yo.  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 Open Inguinal Hernia Surgery  Bilateral. Oral Surgery  Spinal Surgery - Lower Back  Vasectomy  Ventral / Umbilical Hernia Surgery  Bilateral.  Diagnostic Studies History ([name], [place]; 01/02/2018 1:48 PM) Colonoscopy  1-5 years ago  Allergies (  [name], [place]; 01/02/2018 1:49 PM) Sulfa Antibiotics  Allergies Reconciled   Medication History ([name], [place]; 01/02/2018 1:53 PM) Carvedilol (25MG  Tablet, Oral) Active. Hydroxychloroquine Sulfate (200MG  Tablet, Oral) Active. Nabumetone (500MG  Tablet, Oral) Active. Atorvastatin Calcium (20MG  Tablet, Oral) Active. Pantoprazole Sodium (40MG  Tablet DR, Oral) Active. Cinnamon  (Oral) Specific strength unknown - Active. TraMADol HCl (50MG  Tablet, Oral) Active. Sacubitril-Valsartan (49-51MG  Tablet, Oral) Active. Spironolactone (25MG  Tablet, Oral) Active. Glucosamine (Oral) Specific strength unknown - Active. Multi-Vitamin (Oral) Active. Calcium Carbonate (1500 (600 Ca)MG Tablet, Oral) Active. Omega 3-6-9 (Oral) Active. Saw Palmetto (Oral) Specific strength unknown - Active. Aspirin (81MG  Tablet, Oral) Active. Tamsulosin HCl (0.4MG  Capsule, Oral) Active. Turmeric (Oral) Specific strength unknown - Active. Probiotic (Oral) Active. Medications Reconciled  Social History ([name], [place]; 01/02/2018 1:48 PM) Alcohol use  Occasional alcohol use. Caffeine use  Carbonated beverages, Tea. No drug use  Tobacco use  Former smoker.  Family History ([name], [place]; 01/02/2018 1:48 PM) Arthritis  Brother, Daughter, Father, Mother. Depression  Brother. Heart Disease  Brother, Father, Mother. Heart disease in male family member before age 83  Heart disease in male family member before age 38  Hypertension  Brother, Father, Mother. Migraine Headache  Brother. Respiratory Condition  Brother.  Other Problems ([name], [place]; 01/02/2018 1:48 PM) Arthritis  Back Pain  Chest pain  Congestive Heart Failure  Gastroesophageal Reflux Disease  High blood pressure  Hypercholesterolemia  Inguinal Hernia  Melanoma  Sleep Apnea  Umbilical Hernia Repair  Ventral Hernia Repair     Review of Systems (Billy A. Brown RMA; 01/02/2018 1:48 PM) General Not Present- Appetite Loss, Chills, Fatigue, Fever, Night Sweats, Weight Gain and Weight Loss. Skin Not Present- Change in Wart/Mole, Dryness, Hives, Jaundice, New Lesions, Non-Healing Wounds, Rash and Ulcer. HEENT Present- Wears glasses/contact lenses. Not Present- Earache, Hearing Loss, Hoarseness, Nose Bleed, Oral Ulcers, Ringing in the Ears, Seasonal Allergies, Sinus  Pain, Sore Throat, Visual Disturbances and Yellow Eyes. Respiratory Present- Snoring and Wheezing. Not Present- Bloody sputum, Chronic Cough and Difficulty Breathing. Breast Not Present- Breast Mass, Breast Pain, Nipple Discharge and Skin Changes. Cardiovascular Present- Chest Pain, Leg Cramps, Rapid Heart Rate and Shortness of Breath. Not Present- Difficulty Breathing Lying Down, Palpitations and Swelling of Extremities. Gastrointestinal Not Present- Abdominal Pain, Bloating, Bloody Stool, Change in Bowel Habits, Chronic diarrhea, Constipation, Difficulty Swallowing, Excessive gas, Gets full quickly at meals, Hemorrhoids, Indigestion, Nausea, Rectal Pain and Vomiting. Male Genitourinary Present- Frequency, Urgency and Urine Leakage. Not Present- Blood in Urine, Change in Urinary Stream, Impotence, Nocturia and Painful Urination. Musculoskeletal Present- Back Pain, Joint Pain, Joint Stiffness and Swelling of Extremities. Not Present- Muscle Pain and Muscle Weakness. Neurological Not Present- Decreased Memory, Fainting, Headaches, Numbness, Seizures, Tingling, Tremor, Trouble walking and Weakness. Psychiatric Not Present- Anxiety, Bipolar, Change in Sleep Pattern, Depression, Fearful and Frequent crying. Endocrine Not Present- Cold Intolerance, Excessive Hunger, Hair Changes, Heat Intolerance, Hot flashes and New Diabetes. Hematology Present- Blood Thinners. Not Present- Easy Bruising, Excessive bleeding, Gland problems, HIV and Persistent Infections.  Vitals (Billy A. Brown RMA; 01/02/2018 1:49 PM) 01/02/2018 1:48 PM Weight: 186.4 lb Height: 68in Body Surface Area: 1.98 m Body Mass Index: 28.34 kg/m  Temp.: 98.29F  BP: 132/82 (Sitting, Left Arm, Standard)  Physical Exam  General: WN WM alert and generally healthy appearing. Skin: Inspection and palpation of the skin unremarkable.  Eyes: Conjunctivae white, pupils equal. Face, ears, nose, mouth, and throat: Face - normal.  Normal ears and  nose. Lips and teeth normal.  Neck: Supple. No mass. Trachea midline. No thyroid mass.  Lymph Nodes: No supraclavicular or cervical adenopathy. No axillary adenopathy.  Lungs: Normal respiratory effort. Clear to auscultation and symmetric breath sounds. Cardiovascular: Regular rate and rythm. Normal auscultation of the heart. No murmur or rub.   He has a pacemaker in the left uppper chest.  Abdomen: Soft. No mass. Liver and spleen not palpable. No tenderness. No hernia. Normal bowel sounds.  He has a diastasis recti Immediately above his umbilicus, at an old scar, he has a hernia. The bulge is about 4 cm, but the fascial defect is hard to feel. Rectal: Not done.  Musculoskeletal/extremities: Normal gait. Good strength and ROM in upper and lower extremities.  Complains of pain in his hands from his arthritis.  Neurologic: Grossly intact to motor and sensory function.   Psychiatric: Has normal mood and affect. Judgement and insight appear normal.   Assessment & Plan  1.  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 Lucia Gaskins MD; 01/06/2018 1:43 PM)  cardiac clearance by Dr. Burt Knack on 01/02/2018   2) Laparoscopic repair of ventral incisional hernia  2.  Mole on left arm  To remove during surgery  3.  CONGESTIVE HEART FAILURE, CHRONIC, LEFT-SIDED (I50.1)       He is seeing Dr. Burt Knack this afternoon 4.  OBSTRUCTIVE SLEEP APNEA (G47.33)  CPAP at night.  5.  PACEMAKER (Z95.0)  Medtronic CRT-D pacemaker implanted 03/27/17.   6. Coronary artery disease - last cath Nov 2018 status post stenting of the RCA, Left bundle branch block. Dr. Allegra Lai - cardiology 7. 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 He is considering replacing his left knee - I think he is seeing Dr. Sharol Given for this 8. On  Flomax sees Dr. Diona Fanti now 9. Dr. 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 HTN 11. Chronic pain - he takes one Tramadol a day   Alphonsa Overall, MD, [facility] Pager: [phone] Office phone:  [phone]

## 2018-03-20 ENCOUNTER — Ambulatory Visit (HOSPITAL_COMMUNITY): Payer: Medicare Other | Admitting: Certified Registered"

## 2018-03-20 ENCOUNTER — Encounter (HOSPITAL_COMMUNITY)
Admission: RE | Disposition: A | Discharge status: Home / Self Care | Payer: Self-pay | Source: Home / Self Care | Attending: Surgery

## 2018-03-20 ENCOUNTER — Inpatient Hospital Stay (HOSPITAL_COMMUNITY)
Admission: RE | Admit: 2018-03-20 | Discharge: 2018-03-22 | DRG: 354 | Disposition: A | Discharge status: Home / Self Care | Payer: Medicare Other | Attending: Surgery | Admitting: Surgery

## 2018-03-20 ENCOUNTER — Ambulatory Visit (HOSPITAL_COMMUNITY): Payer: Medicare Other | Admitting: Vascular Surgery

## 2018-03-20 DIAGNOSIS — K439 Ventral hernia without obstruction or gangrene: Secondary | ICD-10-CM | POA: Diagnosis not present

## 2018-03-20 DIAGNOSIS — C439 Malignant melanoma of skin, unspecified: Secondary | ICD-10-CM | POA: Diagnosis present

## 2018-03-20 DIAGNOSIS — M25561 Pain in right knee: Secondary | ICD-10-CM | POA: Diagnosis present

## 2018-03-20 DIAGNOSIS — I5022 Chronic systolic (congestive) heart failure: Secondary | ICD-10-CM | POA: Diagnosis present

## 2018-03-20 DIAGNOSIS — N400 Enlarged prostate without lower urinary tract symptoms: Secondary | ICD-10-CM | POA: Diagnosis present

## 2018-03-20 DIAGNOSIS — M19031 Primary osteoarthritis, right wrist: Secondary | ICD-10-CM | POA: Diagnosis not present

## 2018-03-20 DIAGNOSIS — Z950 Presence of cardiac pacemaker: Secondary | ICD-10-CM

## 2018-03-20 DIAGNOSIS — Z818 Family history of other mental and behavioral disorders: Secondary | ICD-10-CM

## 2018-03-20 DIAGNOSIS — Z79899 Other long term (current) drug therapy: Secondary | ICD-10-CM

## 2018-03-20 DIAGNOSIS — Z96651 Presence of right artificial knee joint: Secondary | ICD-10-CM | POA: Diagnosis present

## 2018-03-20 DIAGNOSIS — G8929 Other chronic pain: Secondary | ICD-10-CM | POA: Diagnosis present

## 2018-03-20 DIAGNOSIS — I501 Left ventricular failure: Secondary | ICD-10-CM | POA: Diagnosis not present

## 2018-03-20 DIAGNOSIS — K219 Gastro-esophageal reflux disease without esophagitis: Secondary | ICD-10-CM | POA: Diagnosis present

## 2018-03-20 DIAGNOSIS — D229 Melanocytic nevi, unspecified: Secondary | ICD-10-CM | POA: Diagnosis present

## 2018-03-20 DIAGNOSIS — Z87891 Personal history of nicotine dependence: Secondary | ICD-10-CM

## 2018-03-20 DIAGNOSIS — I110 Hypertensive heart disease with heart failure: Secondary | ICD-10-CM | POA: Diagnosis not present

## 2018-03-20 DIAGNOSIS — K432 Incisional hernia without obstruction or gangrene: Secondary | ICD-10-CM | POA: Diagnosis present

## 2018-03-20 DIAGNOSIS — Z8719 Personal history of other diseases of the digestive system: Secondary | ICD-10-CM

## 2018-03-20 DIAGNOSIS — M19032 Primary osteoarthritis, left wrist: Secondary | ICD-10-CM | POA: Diagnosis not present

## 2018-03-20 DIAGNOSIS — K436 Other and unspecified ventral hernia with obstruction, without gangrene: Principal | ICD-10-CM | POA: Diagnosis present

## 2018-03-20 DIAGNOSIS — G4733 Obstructive sleep apnea (adult) (pediatric): Secondary | ICD-10-CM | POA: Diagnosis not present

## 2018-03-20 DIAGNOSIS — Z9889 Other specified postprocedural states: Secondary | ICD-10-CM

## 2018-03-20 DIAGNOSIS — Z79891 Long term (current) use of opiate analgesic: Secondary | ICD-10-CM

## 2018-03-20 DIAGNOSIS — Z7983 Long term (current) use of bisphosphonates: Secondary | ICD-10-CM

## 2018-03-20 DIAGNOSIS — E785 Hyperlipidemia, unspecified: Secondary | ICD-10-CM | POA: Diagnosis not present

## 2018-03-20 DIAGNOSIS — Z8261 Family history of arthritis: Secondary | ICD-10-CM

## 2018-03-20 DIAGNOSIS — I2510 Atherosclerotic heart disease of native coronary artery without angina pectoris: Secondary | ICD-10-CM | POA: Diagnosis not present

## 2018-03-20 DIAGNOSIS — K4020 Bilateral inguinal hernia, without obstruction or gangrene, not specified as recurrent: Secondary | ICD-10-CM | POA: Diagnosis present

## 2018-03-20 DIAGNOSIS — M19042 Primary osteoarthritis, left hand: Secondary | ICD-10-CM | POA: Diagnosis present

## 2018-03-20 DIAGNOSIS — Z7982 Long term (current) use of aspirin: Secondary | ICD-10-CM

## 2018-03-20 DIAGNOSIS — M5136 Other intervertebral disc degeneration, lumbar region: Secondary | ICD-10-CM | POA: Diagnosis present

## 2018-03-20 DIAGNOSIS — M19041 Primary osteoarthritis, right hand: Secondary | ICD-10-CM | POA: Diagnosis present

## 2018-03-20 DIAGNOSIS — I420 Dilated cardiomyopathy: Secondary | ICD-10-CM | POA: Diagnosis present

## 2018-03-20 DIAGNOSIS — I447 Left bundle-branch block, unspecified: Secondary | ICD-10-CM | POA: Diagnosis present

## 2018-03-20 DIAGNOSIS — L821 Other seborrheic keratosis: Secondary | ICD-10-CM | POA: Diagnosis not present

## 2018-03-20 DIAGNOSIS — Z8249 Family history of ischemic heart disease and other diseases of the circulatory system: Secondary | ICD-10-CM

## 2018-03-20 DIAGNOSIS — Z973 Presence of spectacles and contact lenses: Secondary | ICD-10-CM

## 2018-03-20 DIAGNOSIS — K430 Incisional hernia with obstruction, without gangrene: Secondary | ICD-10-CM | POA: Diagnosis not present

## 2018-03-20 DIAGNOSIS — I10 Essential (primary) hypertension: Secondary | ICD-10-CM | POA: Diagnosis not present

## 2018-03-20 DIAGNOSIS — E7800 Pure hypercholesterolemia, unspecified: Secondary | ICD-10-CM | POA: Diagnosis present

## 2018-03-20 DIAGNOSIS — Z955 Presence of coronary angioplasty implant and graft: Secondary | ICD-10-CM

## 2018-03-20 HISTORY — PX: INSERTION OF MESH: SHX5868

## 2018-03-20 HISTORY — PX: VENTRAL HERNIA REPAIR: SHX424

## 2018-03-20 HISTORY — PX: MOLE REMOVAL: SHX2046

## 2018-03-20 SURGERY — REPAIR, HERNIA, VENTRAL, LAPAROSCOPIC
Anesthesia: General | Site: Arm Lower

## 2018-03-20 MED ORDER — ACETAMINOPHEN 10 MG/ML IV SOLN
INTRAVENOUS | Status: AC
  Filled 2018-03-20: qty 100

## 2018-03-20 MED ORDER — GABAPENTIN 300 MG PO CAPS
300.0000 mg | ORAL_CAPSULE | ORAL | Status: AC
  Administered 2018-03-20: 300 mg via ORAL
  Filled 2018-03-20: qty 1

## 2018-03-20 MED ORDER — SPIRONOLACTONE 12.5 MG HALF TABLET
12.5000 mg | ORAL_TABLET | Freq: Every day | ORAL | Status: DC
  Administered 2018-03-20 – 2018-03-22 (×3): 12.5 mg via ORAL
  Filled 2018-03-20 (×3): qty 1

## 2018-03-20 MED ORDER — GABAPENTIN 300 MG PO CAPS
300.0000 mg | ORAL_CAPSULE | Freq: Two times a day (BID) | ORAL | Status: DC
  Administered 2018-03-20 – 2018-03-22 (×5): 300 mg via ORAL
  Filled 2018-03-20 (×5): qty 1

## 2018-03-20 MED ORDER — PANTOPRAZOLE SODIUM 40 MG PO TBEC
40.0000 mg | DELAYED_RELEASE_TABLET | Freq: Every day | ORAL | Status: DC
  Administered 2018-03-21 – 2018-03-22 (×2): 40 mg via ORAL
  Filled 2018-03-20 (×2): qty 1

## 2018-03-20 MED ORDER — 0.9 % SODIUM CHLORIDE (POUR BTL) OPTIME
TOPICAL | Status: DC | PRN
  Administered 2018-03-20: 1000 mL

## 2018-03-20 MED ORDER — ESMOLOL HCL 100 MG/10ML IV SOLN
INTRAVENOUS | Status: DC | PRN
  Administered 2018-03-20 (×2): 30 mg via INTRAVENOUS

## 2018-03-20 MED ORDER — DEXAMETHASONE SODIUM PHOSPHATE 10 MG/ML IJ SOLN
INTRAMUSCULAR | Status: DC | PRN
  Administered 2018-03-20: 6 mg via INTRAVENOUS

## 2018-03-20 MED ORDER — PHENYLEPHRINE 40 MCG/ML (10ML) SYRINGE FOR IV PUSH (FOR BLOOD PRESSURE SUPPORT)
PREFILLED_SYRINGE | INTRAVENOUS | Status: AC
  Filled 2018-03-20: qty 10

## 2018-03-20 MED ORDER — GLYCOPYRROLATE PF 0.2 MG/ML IJ SOSY
PREFILLED_SYRINGE | INTRAMUSCULAR | Status: AC
  Filled 2018-03-20: qty 1

## 2018-03-20 MED ORDER — ACETAMINOPHEN 500 MG PO TABS
1000.0000 mg | ORAL_TABLET | ORAL | Status: AC
  Administered 2018-03-20: 1000 mg via ORAL
  Filled 2018-03-20: qty 2

## 2018-03-20 MED ORDER — CEFAZOLIN SODIUM-DEXTROSE 2-3 GM-%(50ML) IV SOLR
INTRAVENOUS | Status: DC | PRN
  Administered 2018-03-20: 2 g via INTRAVENOUS

## 2018-03-20 MED ORDER — CARVEDILOL 25 MG PO TABS
25.0000 mg | ORAL_TABLET | Freq: Two times a day (BID) | ORAL | Status: DC
  Administered 2018-03-20 – 2018-03-22 (×4): 25 mg via ORAL
  Filled 2018-03-20 (×4): qty 1

## 2018-03-20 MED ORDER — PHENYLEPHRINE 40 MCG/ML (10ML) SYRINGE FOR IV PUSH (FOR BLOOD PRESSURE SUPPORT)
PREFILLED_SYRINGE | INTRAVENOUS | Status: DC | PRN
  Administered 2018-03-20: 60 ug via INTRAVENOUS

## 2018-03-20 MED ORDER — SUGAMMADEX SODIUM 200 MG/2ML IV SOLN
INTRAVENOUS | Status: DC | PRN
  Administered 2018-03-20: 200 mg via INTRAVENOUS

## 2018-03-20 MED ORDER — POLYVINYL ALCOHOL 1.4 % OP SOLN
1.0000 [drp] | OPHTHALMIC | Status: DC | PRN
  Filled 2018-03-20: qty 15

## 2018-03-20 MED ORDER — EPHEDRINE 5 MG/ML INJ
INTRAVENOUS | Status: AC
  Filled 2018-03-20: qty 10

## 2018-03-20 MED ORDER — HYPROMELLOSE (GONIOSCOPIC) 2.5 % OP SOLN
1.0000 [drp] | Freq: Four times a day (QID) | OPHTHALMIC | Status: DC | PRN
  Filled 2018-03-20: qty 15

## 2018-03-20 MED ORDER — MORPHINE SULFATE (PF) 2 MG/ML IV SOLN: 1.0000 mg | INTRAVENOUS | Status: DC | PRN

## 2018-03-20 MED ORDER — EPHEDRINE SULFATE-NACL 50-0.9 MG/10ML-% IV SOSY
PREFILLED_SYRINGE | INTRAVENOUS | Status: DC | PRN
  Administered 2018-03-20 (×2): 5 mg via INTRAVENOUS
  Administered 2018-03-20: 2.5 mg via INTRAVENOUS

## 2018-03-20 MED ORDER — HYDROCODONE-ACETAMINOPHEN 5-325 MG PO TABS
1.0000 | ORAL_TABLET | ORAL | Status: DC | PRN
  Administered 2018-03-20: 1 via ORAL
  Filled 2018-03-20: qty 1

## 2018-03-20 MED ORDER — LIDOCAINE 2% (20 MG/ML) 5 ML SYRINGE
INTRAMUSCULAR | Status: AC
  Filled 2018-03-20: qty 5

## 2018-03-20 MED ORDER — FENTANYL CITRATE (PF) 100 MCG/2ML IJ SOLN: 25.0000 ug | INTRAMUSCULAR | Status: DC | PRN

## 2018-03-20 MED ORDER — CHLORHEXIDINE GLUCONATE CLOTH 2 % EX PADS: 6.0000 | MEDICATED_PAD | Freq: Once | CUTANEOUS | Status: DC

## 2018-03-20 MED ORDER — LIDOCAINE 2% (20 MG/ML) 5 ML SYRINGE
INTRAMUSCULAR | Status: DC | PRN
  Administered 2018-03-20: 80 mg via INTRAVENOUS

## 2018-03-20 MED ORDER — KCL IN DEXTROSE-NACL 20-5-0.45 MEQ/L-%-% IV SOLN
INTRAVENOUS | Status: DC
  Administered 2018-03-20: 13:00:00 via INTRAVENOUS
  Filled 2018-03-20 (×2): qty 1000

## 2018-03-20 MED ORDER — BUPIVACAINE HCL (PF) 0.25 % IJ SOLN
INTRAMUSCULAR | Status: AC
  Filled 2018-03-20: qty 30

## 2018-03-20 MED ORDER — LACTATED RINGERS IV SOLN
INTRAVENOUS | Status: DC | PRN
  Administered 2018-03-20: 07:00:00 via INTRAVENOUS

## 2018-03-20 MED ORDER — PROPOFOL 10 MG/ML IV BOLUS
INTRAVENOUS | Status: DC | PRN
  Administered 2018-03-20: 120 mg via INTRAVENOUS
  Administered 2018-03-20: 40 mg via INTRAVENOUS

## 2018-03-20 MED ORDER — TRAMADOL HCL 50 MG PO TABS
50.0000 mg | ORAL_TABLET | Freq: Every day | ORAL | Status: DC
  Administered 2018-03-20 – 2018-03-21 (×2): 50 mg via ORAL
  Filled 2018-03-20 (×2): qty 1

## 2018-03-20 MED ORDER — KETAMINE HCL 50 MG/5ML IJ SOSY
PREFILLED_SYRINGE | INTRAMUSCULAR | Status: AC
  Filled 2018-03-20: qty 5

## 2018-03-20 MED ORDER — SACUBITRIL-VALSARTAN 49-51 MG PO TABS
1.0000 | ORAL_TABLET | Freq: Two times a day (BID) | ORAL | Status: DC
  Administered 2018-03-20 – 2018-03-22 (×4): 1 via ORAL
  Filled 2018-03-20 (×4): qty 1

## 2018-03-20 MED ORDER — FENTANYL CITRATE (PF) 250 MCG/5ML IJ SOLN
INTRAMUSCULAR | Status: AC
  Filled 2018-03-20: qty 5

## 2018-03-20 MED ORDER — SODIUM CHLORIDE 0.9 % IV SOLN
INTRAVENOUS | Status: DC | PRN
  Administered 2018-03-20: 30 ug/min via INTRAVENOUS

## 2018-03-20 MED ORDER — ROCURONIUM BROMIDE 10 MG/ML (PF) SYRINGE
PREFILLED_SYRINGE | INTRAVENOUS | Status: DC | PRN
  Administered 2018-03-20: 50 mg via INTRAVENOUS

## 2018-03-20 MED ORDER — BUPIVACAINE HCL (PF) 0.25 % IJ SOLN
INTRAMUSCULAR | Status: DC | PRN
  Administered 2018-03-20: 30 mL

## 2018-03-20 MED ORDER — ONDANSETRON 4 MG PO TBDP: 4.0000 mg | ORAL_TABLET | Freq: Four times a day (QID) | ORAL | Status: DC | PRN

## 2018-03-20 MED ORDER — ROCURONIUM BROMIDE 50 MG/5ML IV SOSY
PREFILLED_SYRINGE | INTRAVENOUS | Status: AC
  Filled 2018-03-20: qty 5

## 2018-03-20 MED ORDER — ONDANSETRON HCL 4 MG/2ML IJ SOLN
INTRAMUSCULAR | Status: DC | PRN
  Administered 2018-03-20: 4 mg via INTRAVENOUS

## 2018-03-20 MED ORDER — ONDANSETRON HCL 4 MG/2ML IJ SOLN
4.0000 mg | Freq: Four times a day (QID) | INTRAMUSCULAR | Status: DC | PRN
  Administered 2018-03-20: 4 mg via INTRAVENOUS

## 2018-03-20 MED ORDER — DEXAMETHASONE SODIUM PHOSPHATE 10 MG/ML IJ SOLN
INTRAMUSCULAR | Status: AC
  Filled 2018-03-20: qty 1

## 2018-03-20 MED ORDER — ESMOLOL HCL 100 MG/10ML IV SOLN
INTRAVENOUS | Status: AC
  Filled 2018-03-20: qty 10

## 2018-03-20 MED ORDER — CEFAZOLIN SODIUM-DEXTROSE 2-4 GM/100ML-% IV SOLN
2.0000 g | INTRAVENOUS | Status: DC
  Filled 2018-03-20: qty 100

## 2018-03-20 MED ORDER — FENTANYL CITRATE (PF) 100 MCG/2ML IJ SOLN
INTRAMUSCULAR | Status: DC | PRN
  Administered 2018-03-20: 50 ug via INTRAVENOUS
  Administered 2018-03-20: 100 ug via INTRAVENOUS
  Administered 2018-03-20: 25 ug via INTRAVENOUS
  Administered 2018-03-20: 50 ug via INTRAVENOUS
  Administered 2018-03-20: 25 ug via INTRAVENOUS

## 2018-03-20 MED ORDER — ENOXAPARIN SODIUM 40 MG/0.4ML ~~LOC~~ SOLN
40.0000 mg | SUBCUTANEOUS | Status: DC
  Administered 2018-03-21 – 2018-03-22 (×2): 40 mg via SUBCUTANEOUS
  Filled 2018-03-20 (×2): qty 0.4

## 2018-03-20 MED ORDER — TAMSULOSIN HCL 0.4 MG PO CAPS
0.4000 mg | ORAL_CAPSULE | Freq: Every day | ORAL | Status: DC
  Administered 2018-03-20 – 2018-03-21 (×2): 0.4 mg via ORAL
  Filled 2018-03-20 (×2): qty 1

## 2018-03-20 MED ORDER — PROPOFOL 10 MG/ML IV BOLUS
INTRAVENOUS | Status: AC
  Filled 2018-03-20: qty 40

## 2018-03-20 MED ORDER — KETAMINE HCL 50 MG/ML IJ SOLN
INTRAMUSCULAR | Status: DC | PRN
  Administered 2018-03-20 (×3): 10 mg via INTRAMUSCULAR

## 2018-03-20 MED ORDER — ONDANSETRON HCL 4 MG/2ML IJ SOLN
INTRAMUSCULAR | Status: AC
  Filled 2018-03-20: qty 2

## 2018-03-20 SURGICAL SUPPLY — 53 items
ADH SKN CLS APL DERMABOND .7 (GAUZE/BANDAGES/DRESSINGS) ×4
APL SKNCLS STERI-STRIP NONHPOA (GAUZE/BANDAGES/DRESSINGS) ×2
APPLIER CLIP LOGIC TI 5 (MISCELLANEOUS) IMPLANT
APR CLP MED LRG 33X5 (MISCELLANEOUS)
BENZOIN TINCTURE PRP APPL 2/3 (GAUZE/BANDAGES/DRESSINGS) ×3 IMPLANT
BINDER ABDOMINAL 12 ML 46-62 (SOFTGOODS) ×3 IMPLANT
BLADE CLIPPER SURG (BLADE) ×1 IMPLANT
BLADE SURG 15 STRL LF DISP TIS (BLADE) IMPLANT
BLADE SURG 15 STRL SS (BLADE) ×3
CANISTER SUCT 3000ML PPV (MISCELLANEOUS) ×1 IMPLANT
COVER SURGICAL LIGHT HANDLE (MISCELLANEOUS) ×3 IMPLANT
COVER WAND RF STERILE (DRAPES) ×3 IMPLANT
DERMABOND ADVANCED (GAUZE/BANDAGES/DRESSINGS) ×2
DERMABOND ADVANCED .7 DNX12 (GAUZE/BANDAGES/DRESSINGS) ×2 IMPLANT
DEVICE SECURE STRAP 25 ABSORB (INSTRUMENTS) ×4 IMPLANT
DEVICE TROCAR PUNCTURE CLOSURE (ENDOMECHANICALS) ×4 IMPLANT
DRAPE INCISE IOBAN 66X45 STRL (DRAPES) ×3 IMPLANT
DRAPE LAPAROSCOPIC ABDOMINAL (DRAPES) ×3 IMPLANT
ELECT REM PT RETURN 9FT ADLT (ELECTROSURGICAL) ×3
ELECTRODE REM PT RTRN 9FT ADLT (ELECTROSURGICAL) ×2 IMPLANT
GLOVE BIO SURGEON STRL SZ7 (GLOVE) ×1 IMPLANT
GLOVE BIOGEL PI IND STRL 7.0 (GLOVE) IMPLANT
GLOVE BIOGEL PI INDICATOR 7.0 (GLOVE) ×2
GLOVE ECLIPSE 6.0 STRL STRAW (GLOVE) ×2 IMPLANT
GLOVE SS BIOGEL STRL SZ 7 (GLOVE) IMPLANT
GLOVE SUPERSENSE BIOGEL SZ 7 (GLOVE) ×1
GLOVE SURG SIGNA 7.5 PF LTX (GLOVE) ×4 IMPLANT
GLOVE SURG SS PI 7.5 STRL IVOR (GLOVE) ×1 IMPLANT
GOWN STRL REUS W/ TWL LRG LVL3 (GOWN DISPOSABLE) ×4 IMPLANT
GOWN STRL REUS W/ TWL XL LVL3 (GOWN DISPOSABLE) ×2 IMPLANT
GOWN STRL REUS W/TWL LRG LVL3 (GOWN DISPOSABLE) ×9
GOWN STRL REUS W/TWL XL LVL3 (GOWN DISPOSABLE) ×3
KIT BASIN OR (CUSTOM PROCEDURE TRAY) ×3 IMPLANT
KIT TURNOVER KIT B (KITS) ×3 IMPLANT
MARKER SKIN DUAL TIP RULER LAB (MISCELLANEOUS) ×3 IMPLANT
MESH PARIETEX 20X15 (Mesh General) ×1 IMPLANT
NDL SPNL 22GX3.5 QUINCKE BK (NEEDLE) ×2 IMPLANT
NEEDLE SPNL 22GX3.5 QUINCKE BK (NEEDLE) ×3 IMPLANT
NS IRRIG 1000ML POUR BTL (IV SOLUTION) ×3 IMPLANT
PAD ARMBOARD 7.5X6 YLW CONV (MISCELLANEOUS) ×6 IMPLANT
SCISSORS LAP 5X35 DISP (ENDOMECHANICALS) ×3 IMPLANT
SET IRRIG TUBING LAPAROSCOPIC (IRRIGATION / IRRIGATOR) IMPLANT
SHEARS HARMONIC ACE PLUS 36CM (ENDOMECHANICALS) ×3 IMPLANT
SLEEVE ENDOPATH XCEL 5M (ENDOMECHANICALS) ×5 IMPLANT
STRIP CLOSURE SKIN 1/2X4 (GAUZE/BANDAGES/DRESSINGS) ×1 IMPLANT
SUT MNCRL AB 4-0 PS2 18 (SUTURE) ×4 IMPLANT
SUT NOVA 0 T19/GS 22DT (SUTURE) ×4 IMPLANT
TOWEL GREEN STERILE FF (TOWEL DISPOSABLE) ×1 IMPLANT
TRAY LAPAROSCOPIC MC (CUSTOM PROCEDURE TRAY) ×3 IMPLANT
TROCAR XCEL NON-BLD 11X100MML (ENDOMECHANICALS) ×3 IMPLANT
TROCAR XCEL NON-BLD 5MMX100MML (ENDOMECHANICALS) ×3 IMPLANT
TUBING INSUFFLATION (TUBING) ×3 IMPLANT
WATER STERILE IRR 1000ML POUR (IV SOLUTION) ×3 IMPLANT

## 2018-03-20 NOTE — Interval H&P Note (Signed)
History and Physical Interval Note:  03/20/2018 7:20 AM  [name]  has presented today for surgery, with the diagnosis of ventral hernia  The various methods of treatment have been discussed with the patient and family.  Wife in room.  Also to remove mole on left arm.  After consideration of risks, benefits and other options for treatment, the patient has consented to  Procedure(s): [place] (N/A) INSERTION OF MESH (N/A) as a surgical intervention .  The patient's history has been reviewed, patient examined, no change in status, stable for surgery.  I have reviewed the patient's chart and labs.  Questions were answered to the patient's satisfaction.     Shann Medal

## 2018-03-20 NOTE — Anesthesia Procedure Notes (Signed)
Procedure Name: Intubation Date/Time: 03/20/2018 7:39 AM Performed by: Cleda Daub, CRNA Pre-anesthesia Checklist: Patient identified, Emergency Drugs available, Suction available and Patient being monitored Patient Re-evaluated:Patient Re-evaluated prior to induction Oxygen Delivery Method: Circle system utilized Preoxygenation: Pre-oxygenation with 100% oxygen Induction Type: IV induction Ventilation: Two handed mask ventilation required and Oral airway inserted - appropriate to patient size Laryngoscope Size: Glidescope and 4 Grade View: Grade I Tube type: Oral Tube size: 8.0 mm Number of attempts: 1 Airway Equipment and Method: Stylet and Video-laryngoscopy Placement Confirmation: ETT inserted through vocal cords under direct vision,  positive ETCO2 and breath sounds checked- equal and bilateral Secured at: 22 cm Tube secured with: Tape Dental Injury: Teeth and Oropharynx as per pre-operative assessment

## 2018-03-20 NOTE — Discharge Instructions (Signed)
[facility] - DISCHARGE INSTRUCTIONS TO PATIENT  Activity:  Driving - May drive in 2 - 4 days, if doing well   Lifting - No lifting more than 15 pounds for 4 weeks  Wound Care:   May shower on Sunday       Wear abdominal binder for 4 weeks (while awake)  Diet:  As tolerated  Follow up appointment:  Call Dr. Pollie Friar office [facility]) at [phone] for an appointment in 2 to 3 weeks  Medications and dosages:  Resume your home medications.  You have a prescription for:  vicodin  Call Dr. Lucia Gaskins or his office  [phone]) if you have:  Temperature greater than 100.4,  Persistent nausea and vomiting,  Severe uncontrolled pain,  Redness, tenderness, or signs of infection (pain, swelling, redness, odor or green/yellow discharge around the site),  Difficulty breathing, headache or visual disturbances,  Any other questions or concerns you may have after discharge.  In an emergency, call 911 or go to an Emergency Department at a nearby hospital.

## 2018-03-20 NOTE — Transfer of Care (Signed)
Immediate Anesthesia Transfer of Care Note  Patient: [name]  Procedure(s) Performed: LAPAROSCOPIC VENTRAL INCISIONAL  HERNIA ERAS PATHWAY (N/A Abdomen) INSERTION OF MESH (N/A Abdomen) MOLE REMOVAL (Left Arm Lower)  Patient Location: PACU  Anesthesia Type:General  Level of Consciousness: awake, alert , oriented and patient cooperative  Airway & Oxygen Therapy: Patient Spontanous Breathing and Patient connected to face mask oxygen  Post-op Assessment: Report given to RN and Post -op Vital signs reviewed and stable  Post vital signs: Reviewed and stable  Last Vitals:  Vitals Value Taken Time  BP 159/80 03/20/2018 10:15 AM  Temp 36.4 C 03/20/2018 10:15 AM  Pulse 82 03/20/2018 10:21 AM  Resp 18 03/20/2018 10:21 AM  SpO2 94 % 03/20/2018 10:21 AM  Vitals shown include unvalidated device data.  Last Pain:  Vitals:   03/20/18 1015  TempSrc:   PainSc: (P) 0-No pain         Complications: No apparent anesthesia complications

## 2018-03-20 NOTE — Progress Notes (Signed)
Pt placed on CPAP for the night.  Nasal mask secured. NAD noted.

## 2018-03-20 NOTE — Op Note (Signed)
OPERATIVE NOTE  03/20/2018  10:08 AM  PATIENT:  [name], 72 y.o., male, MRN: [id number]  PREOP DIAGNOSIS:  Incarcerated recurrent ventral hernia, mole left arm mole (2 x 6 mm)  POSTOP DIAGNOSIS:   Incarcerated recurrent ventral hernia, a "Swiss cheese" defect measuring 4.5 x 4.5 cm, left arm mole (2 x 6 mm)  PROCEDURE:   Procedure(s): LAPAROSCOPIC VENTRAL INCISIONAL  HERNIA, INSERTION OF MESH,  MOLE REMOVAL from left arm  SURGEON:   Alphonsa Overall, M.D.  ASSISTANT:   None  ANESTHESIA:   general  Anesthesiologist: Freddrick March, MD CRNA: Cleda Daub, CRNA; Imagene Riches, CRNA  General  EBL:  minimal  ml  BLOOD ADMINISTERED: none  DRAINS: none   LOCAL MEDICATIONS USED:   20 cc of exparel and 30 cc 1/4% marcaine  SPECIMEN:   Left arm mole  COUNTS CORRECT:  YES  INDICATIONS FOR PROCEDURE:  [name] is a 72 y.o. (DOB: 1946/02/06) white male whose primary care physician is [name], MD and comes for laparoscopic repair of ventral incision hernia.  He also has a mole in this left arm at his antecubital fossa that I will remove at the same time.   The indications and risks of the surgery were explained to the patient.  The risks include, but are not limited to, infection, bleeding, and nerve injury.  OPERATIVE NOTE:  The patient was taken to OR room 2 at [facility]  He underwent a general anesthesia.  He was given 2 grams of Ancef at the beginning of the operation.   A time out was held and the surgical checklist run.   The abdomen was prepped with Cholroprep and sterilely draped.  I covered the abdomen with a Ioban drape.   I accessed the LUQ with a 5 mm trocar, that I later upsized to a 10 mm trocar..  An additional 5 mm trocar was placed in the left mid abdomen and a 5 mm trocar in the right abdomen.   He had a 4.5 cm x 4.5 cm "Swiss cheese" hernia in his epigastrium at an old incision.  There was omentum incarcerated in the defects.  I reduced and  removed all the omentum/fat in the hernias.   Then I placed a 15 cm x 15 cm Parietex mesh in the abdomen.  It had 8 holding sutures of 0 Novafil.  These were tied down to secure the mesh to the anterior abdominal wall.  I then used 37 Securestrap tacks to tack the mesh to the anterior abdominal wall.   The abdomen was deflated.  The mesh was inspected and there were no defects around the edge.  The trocars were then removed.  There was no bleeding at the trocar sites.  The incisions were closed with 4-0 Monocryl and painted with DermaBond. The puncture sites were painted with DermaBond.   He had a 0.2 cm x 0.6 cm dark mole at his left antercubital fossa.  I painted this area with betadine.  I excised the lesion and sent it to pathology.  I closed the wound with a single 4-0 monocryl suture.  I covered the wound with Dermabond.   The sponge and needle count were correct.  The patient had an abdominal binder placed.  The patient was transferred to the recovery room in good condition.   I took photos, but they appear to have not made it into Epic.  Type of repair - mesh  (choices - primary suture, mesh,  or component)  Name of mesh - Parietex  Size of mesh - Length 15 cm, Width 15 cm  Mesh overlap - 5 cm  Placement of mesh - beneath fascia and into the peritoneal cavity  (choices - beneath fascia and into peritoneal cavity, beneath fascia but external to peritoneal cavity, between the muscle and fascia, above or external to fascia)   Alphonsa Overall, MD, [facility] Surgery Pager: [phone] Office phone:  [phone]

## 2018-03-21 DIAGNOSIS — K436 Other and unspecified ventral hernia with obstruction, without gangrene: Secondary | ICD-10-CM | POA: Diagnosis present

## 2018-03-21 DIAGNOSIS — E7800 Pure hypercholesterolemia, unspecified: Secondary | ICD-10-CM | POA: Diagnosis present

## 2018-03-21 DIAGNOSIS — K219 Gastro-esophageal reflux disease without esophagitis: Secondary | ICD-10-CM | POA: Diagnosis present

## 2018-03-21 DIAGNOSIS — M19042 Primary osteoarthritis, left hand: Secondary | ICD-10-CM | POA: Diagnosis present

## 2018-03-21 DIAGNOSIS — M19031 Primary osteoarthritis, right wrist: Secondary | ICD-10-CM | POA: Diagnosis present

## 2018-03-21 DIAGNOSIS — I420 Dilated cardiomyopathy: Secondary | ICD-10-CM | POA: Diagnosis present

## 2018-03-21 DIAGNOSIS — G4733 Obstructive sleep apnea (adult) (pediatric): Secondary | ICD-10-CM | POA: Diagnosis present

## 2018-03-21 DIAGNOSIS — M5136 Other intervertebral disc degeneration, lumbar region: Secondary | ICD-10-CM | POA: Diagnosis present

## 2018-03-21 DIAGNOSIS — I447 Left bundle-branch block, unspecified: Secondary | ICD-10-CM | POA: Diagnosis present

## 2018-03-21 DIAGNOSIS — D229 Melanocytic nevi, unspecified: Secondary | ICD-10-CM | POA: Diagnosis present

## 2018-03-21 DIAGNOSIS — I5022 Chronic systolic (congestive) heart failure: Secondary | ICD-10-CM | POA: Diagnosis present

## 2018-03-21 DIAGNOSIS — G8929 Other chronic pain: Secondary | ICD-10-CM | POA: Diagnosis present

## 2018-03-21 DIAGNOSIS — K432 Incisional hernia without obstruction or gangrene: Secondary | ICD-10-CM | POA: Diagnosis present

## 2018-03-21 DIAGNOSIS — Z96651 Presence of right artificial knee joint: Secondary | ICD-10-CM | POA: Diagnosis present

## 2018-03-21 DIAGNOSIS — I501 Left ventricular failure: Secondary | ICD-10-CM | POA: Diagnosis present

## 2018-03-21 DIAGNOSIS — M19041 Primary osteoarthritis, right hand: Secondary | ICD-10-CM | POA: Diagnosis present

## 2018-03-21 DIAGNOSIS — N400 Enlarged prostate without lower urinary tract symptoms: Secondary | ICD-10-CM | POA: Diagnosis present

## 2018-03-21 DIAGNOSIS — M19032 Primary osteoarthritis, left wrist: Secondary | ICD-10-CM | POA: Diagnosis present

## 2018-03-21 DIAGNOSIS — I2510 Atherosclerotic heart disease of native coronary artery without angina pectoris: Secondary | ICD-10-CM | POA: Diagnosis present

## 2018-03-21 DIAGNOSIS — K4020 Bilateral inguinal hernia, without obstruction or gangrene, not specified as recurrent: Secondary | ICD-10-CM | POA: Diagnosis present

## 2018-03-21 DIAGNOSIS — I110 Hypertensive heart disease with heart failure: Secondary | ICD-10-CM | POA: Diagnosis present

## 2018-03-21 DIAGNOSIS — Z818 Family history of other mental and behavioral disorders: Secondary | ICD-10-CM | POA: Diagnosis not present

## 2018-03-21 DIAGNOSIS — C439 Malignant melanoma of skin, unspecified: Secondary | ICD-10-CM | POA: Diagnosis present

## 2018-03-21 DIAGNOSIS — M25561 Pain in right knee: Secondary | ICD-10-CM | POA: Diagnosis present

## 2018-03-21 NOTE — Progress Notes (Signed)
Rt called to pt's room to check CPAP. Pt is back on CPAP.  Sp02 ws 89%, 3L of 02 bled through.Spo2 now 94%. Will continue to monitor.

## 2018-03-21 NOTE — Progress Notes (Signed)
Patient ID: [name], male   DOB: 1946/01/17, 72 y.o.   MRN: [id number] [facility] Surgery Progress Note:   1 Day Post-Op  Subjective: Mental status is clear.  Sitting up at bedside about to eat his first meal.   Objective: Vital signs in last 24 hours: Temp:  [97.5 F (36.4 C)-99.5 F (37.5 C)] 98.4 F (36.9 C) (11/02 0507) Pulse Rate:  [67-91] 67 (11/02 0507) Resp:  [11-118] 20 (11/02 0507) BP: (129-162)/(63-84) 131/63 (11/02 0507) SpO2:  [90 %-100 %] 100 % (11/02 0507)  Intake/Output from previous day: 11/01 0701 - 11/02 0700 In: 2102.5 [P.O.:360; I.V.:1742.5] Out: 10 [Blood:10] Intake/Output this shift: No intake/output data recorded.  Physical Exam: Work of breathing is not labored. Abdominal binder in place.  No flatus.  Lab Results:  No results found for this or any previous visit (from the past 48 hour(s)).  Radiology/Results: No results found.  Anti-infectives: Anti-infectives (From admission, onward)   Start     Dose/Rate Route Frequency Ordered Stop   03/20/18 0615  ceFAZolin (ANCEF) IVPB 2g/100 mL premix  Status:  Discontinued     2 g 200 mL/hr over 30 Minutes Intravenous On call to O.R. 03/20/18 0602 03/20/18 1219      Assessment/Plan: Problem List: Patient Active Problem List   Diagnosis Date Noted  . Incarcerated ventral hernia 03/20/2018  . CHF (congestive heart failure) ([place]) 03/27/2017  . DCM (dilated cardiomyopathy) ([place]) 02/11/2017  . Chronic systolic heart failure ([place]) 01/15/2017  . History of pneumonia 10/23/2016  . Osteoarthritis 10/23/2016  . Primary osteoarthritis of both feet 10/04/2016  . Primary osteoarthritis of left knee 10/04/2016  . DDD (degenerative disc disease), lumbar 10/04/2016  . Hemoptysis 05/27/2016  . Overweight (BMI 25.0-29.9) 03/14/2016  . 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  . Parotid mass 08/23/2015  . Dyspnea 11/29/2014  . Synovial cyst of lumbar facet joint 09/26/2014  . Incisional hernia, without obstruction or  gangrene 12/06/2013  . Nausea alone 08/27/2013  . Colon cancer screening 08/27/2013  . 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 Benign prostatic hyperplasia with urinary obstruction 06/18/2010  . OTHER SPECIFIED DISORDER OF STOMACH AND DUODENUM 12/16/2008  . BENIGN NEOPLASM OF LIVER AND BILIARY PASSAGES 11/15/2008  . GERD 09/02/2008  . COLONIC POLYPS, HYPERPLASTIC 05/24/2007  . Obstructive sleep apnea 05/24/2007  . ALLERGIC RHINITIS 05/24/2007  . ESOPHAGEAL STRICTURE 05/24/2007  . HIATAL HERNIA 05/24/2007  . Hyperlipidemia 02/06/2007  . Essential hypertension 02/06/2007  . CAD (coronary artery disease) 02/06/2007  . Primary osteoarthritis of both hands 02/06/2007  . Neck pain on right side 02/06/2007    Will see how he does with his diet and plan discharge in the am.   1 Day Post-Op    LOS: 0 days   Matt B. Hassell Done, MD, [facility] [phone] beeper [phone]  03/21/2018 9:36 AM

## 2018-03-21 NOTE — Anesthesia Postprocedure Evaluation (Signed)
Anesthesia Post Note  Patient: [name]  Procedure(s) Performed: LAPAROSCOPIC VENTRAL INCISIONAL  HERNIA ERAS PATHWAY (N/A Abdomen) INSERTION OF MESH (N/A Abdomen) MOLE REMOVAL (Left Arm Lower)     Patient location during evaluation: PACU Anesthesia Type: General Level of consciousness: awake and alert Pain management: pain level controlled Vital Signs Assessment: post-procedure vital signs reviewed and stable Respiratory status: spontaneous breathing, nonlabored ventilation, respiratory function stable and patient connected to nasal cannula oxygen Cardiovascular status: blood pressure returned to baseline and stable Postop Assessment: no apparent nausea or vomiting Anesthetic complications: no    Last Vitals:  Vitals:   03/21/18 0124 03/21/18 0507  BP: 130/65 131/63  Pulse: 72 67  Resp: 20 20  Temp: 36.5 C 36.9 C  SpO2: 94% 100%    Last Pain:  Vitals:   03/21/18 0507  TempSrc: Oral  PainSc:                  Kamill Fulbright L Javaeh Muscatello

## 2018-03-22 ENCOUNTER — Encounter (HOSPITAL_COMMUNITY): Payer: Self-pay

## 2018-03-22 MED ORDER — ONDANSETRON 4 MG PO TBDP: 4.0000 mg | ORAL_TABLET | Freq: Four times a day (QID) | ORAL | 0 refills | Status: DC | PRN

## 2018-03-22 MED ORDER — HYDROCODONE-ACETAMINOPHEN 5-325 MG PO TABS: 1.0000 | ORAL_TABLET | ORAL | 0 refills | Status: DC | PRN

## 2018-03-22 NOTE — Plan of Care (Signed)

## 2018-03-22 NOTE — Progress Notes (Signed)
[name] to be D/C'd  per MD order. Discussed with the patient and all questions fully answered.  Skin clean, dry and intact without evidence of skin break down, no evidence of skin tears noted.  IV catheter discontinued intact. Site without signs and symptoms of complications. Dressing and pressure applied.  An After Visit Summary was printed and given to the patient. Prescription was sent to pharmacy.  D/c education completed with patient/family including follow up instructions, medication list, d/c activities limitations if indicated, with other d/c instructions as indicated by MD - patient able to verbalize understanding, all questions fully answered.   Patient instructed to return to ED, call 911, or call MD for any changes in condition.   Patient to be escorted via [place], and D/C home via private auto.

## 2018-03-22 NOTE — Discharge Summary (Signed)
Physician Discharge Summary  Patient ID: [name] MRN: [id number] DOB/AGE: February 10, 1946 72 y.o.  PCP: [name], MD  Admit date: 03/20/2018 Discharge date: 03/22/2018  Admission Diagnoses:  Ventral hernia  Discharge Diagnoses:  same  Active Problems:   Incarcerated ventral hernia   Surgery:  Lap ventral hernia repair  Discharged Condition: same  Hospital Course:   Had surgery by Dr. Lucia Gaskins on Friday.  Sore and distended on Saturday and ready for discharge on Sunday.    Consults: none  Significant Diagnostic Studies: none    Discharge Exam: Blood pressure 123/62, pulse 66, temperature 98 F (36.7 C), temperature source Oral, resp. rate 18, height 5' 7.5" (1.715 m), weight 83.9 kg, SpO2 93 %. Incisions OK with Dermabond  Disposition: Discharge disposition: 01-Home or Self Care       Discharge Instructions    Call MD for:  persistant nausea and vomiting   Complete by:  As directed    Call MD for:  redness, tenderness, or signs of infection (pain, swelling, redness, odor or green/yellow discharge around incision site)   Complete by:  As directed    Call MD for:  severe uncontrolled pain   Complete by:  As directed    Diet - low sodium heart healthy   Complete by:  As directed    Discharge instructions   Complete by:  As directed    Wear abdominal binder as tolerated for comfort.   May shower and get incisions wet   Increase activity slowly   Complete by:  As directed      Allergies as of 03/22/2018      Reactions   Sulfonamide Derivatives Rash          Medication List    TAKE these medications   aspirin EC 81 MG tablet Take 1 tablet (81 mg total) by mouth daily.   atorvastatin 20 MG tablet Commonly known as:  LIPITOR Take 20 mg by mouth daily.   b complex vitamins tablet Take 1 tablet by mouth daily with lunch.   calcium carbonate 1500 (600 Ca) MG Tabs tablet Commonly known as:  OSCAL Take 600 mg by mouth every evening.   carvedilol  25 MG tablet Commonly known as:  COREG Take 1 tablet (25 mg total) by mouth 2 (two) times daily.   CINNAMON PO Take 1 tablet by mouth daily with lunch.   Co Q 10 100 MG Caps Take 1 capsule by mouth daily with lunch.   diclofenac sodium 1 % Gel Commonly known as:  VOLTAREN Apply 1 application topically 4 (four) times daily as needed (pain).   ENTRESTO 49-51 MG Generic drug:  sacubitril-valsartan TAKE 1 TABLET BY MOUTH 2 TIMES DAILY   Fish Oil 500 MG Caps Take 500 mg by mouth at bedtime.   folic acid 782 MCG tablet Commonly known as:  FOLVITE Take 400 mcg by mouth daily.   GLUCOSAMINE PO Take 1,500 mg by mouth every evening.   HYDROcodone-acetaminophen 5-325 MG tablet Commonly known as:  NORCO/VICODIN Take 1-2 tablets by mouth every 4 (four) hours as needed for moderate pain.   hydroxychloroquine 200 MG tablet Commonly known as:  PLAQUENIL Take one tablet (200 mg) twice daily Monday through Friday only. Do not take Saturday or Sundays.   hydroxypropyl methylcellulose / hypromellose 2.5 % ophthalmic solution Commonly known as:  ISOPTO TEARS / GONIOVISC Place 1 drop into both eyes 4 (four) times daily as needed for dry eyes.   MAGNESIUM PO Take  1 tablet by mouth daily as needed (for cramping).   multivitamin capsule Take 1 capsule by mouth daily.   nabumetone 500 MG tablet Commonly known as:  RELAFEN Take 1 tablet (500 mg total) by mouth 2 (two) times daily.   NON FORMULARY CPAP machine with sleep.   OCUVITE PO Take 1 tablet by mouth at bedtime.   ondansetron 4 MG disintegrating tablet Commonly known as:  ZOFRAN-ODT Take 1 tablet (4 mg total) by mouth every 6 (six) hours as needed for nausea.   pantoprazole 40 MG tablet Commonly known as:  PROTONIX TAKE 1 TABLET BY MOUTH EVERY DAY BEFORE BREAKFAST GENERIC EQUIVALENT FOR PROTONIX   PROBIOTIC DAILY PO Take 1 capsule by mouth daily with lunch.   SAW PALMETTO PO Take 1 capsule by mouth every evening.    spironolactone 25 MG tablet Commonly known as:  ALDACTONE TAKE 1/2 TABLET(12.5 MG) BY MOUTH DAILY What changed:  See the new instructions.   tamsulosin 0.4 MG Caps capsule Commonly known as:  FLOMAX Take 0.4 mg by mouth at bedtime.   Testosterone 20 % Crea Apply 2 mLs topically daily. Rub on shoulder   traMADol 50 MG tablet Commonly known as:  ULTRAM Take 50 mg by mouth daily with lunch.   TURMERIC PO Take 1 tablet by mouth daily with lunch.        Signed: Pedro Earls 03/22/2018, 10:59 AM

## 2018-03-22 NOTE — Plan of Care (Signed)

## 2018-03-23 ENCOUNTER — Encounter (HOSPITAL_COMMUNITY): Payer: Self-pay | Admitting: Surgery

## 2018-03-24 NOTE — Consult Note (Signed)
            [facility] CM Primary Care Navigator  03/24/2018  [name] 1946-04-16 [id number]   Attempt to seepatient at the bedside to identify possible discharge needs buthe wasalreadydischargedhomeover the weekend.  Per MD note,patient was referred by primary care provider and was admitted for incarcerated ventral hernia status post laparoscopic ventral hernia repair with mesh.   Patient has discharge instruction to follow-up withgeneral surgery in 2- 3 weeks (call office for an appointment).   For additional questions please contact:  Edwena Felty A. Chella Chapdelaine, BSN, RN-BC [facility] PRIMARY CARE Navigator Cell: [phone]

## 2018-04-08 ENCOUNTER — Ambulatory Visit (INDEPENDENT_AMBULATORY_CARE_PROVIDER_SITE_OTHER): Payer: Medicare Other

## 2018-04-08 DIAGNOSIS — I428 Other cardiomyopathies: Secondary | ICD-10-CM

## 2018-04-09 NOTE — Progress Notes (Signed)
Remote ICD transmission.   

## 2018-04-21 ENCOUNTER — Telehealth: Payer: Self-pay | Admitting: Rheumatology

## 2018-04-21 ENCOUNTER — Ambulatory Visit: Payer: BLUE CROSS/BLUE SHIELD | Admitting: Rheumatology

## 2018-04-21 MED ORDER — NABUMETONE 500 MG PO TABS: 500.0000 mg | ORAL_TABLET | Freq: Two times a day (BID) | ORAL | 0 refills | Status: DC

## 2018-04-21 NOTE — Telephone Encounter (Signed)
Patient request a refill on Nabumetone 500mg  , 90 day supply sent to [facility].

## 2018-04-21 NOTE — Telephone Encounter (Signed)
Last Visit: 02/04/18 Next Visit: 07/07/18 Labs: 12/23/17 Glucose is 129. All other labs are WNL  Okay to refill per Dr. Estanislado Pandy

## 2018-04-27 ENCOUNTER — Encounter: Payer: Self-pay | Admitting: Nurse Practitioner

## 2018-04-29 DIAGNOSIS — R3915 Urgency of urination: Secondary | ICD-10-CM | POA: Diagnosis not present

## 2018-04-29 DIAGNOSIS — N401 Enlarged prostate with lower urinary tract symptoms: Secondary | ICD-10-CM | POA: Diagnosis not present

## 2018-05-06 ENCOUNTER — Ambulatory Visit: Payer: Medicare Other | Admitting: Nurse Practitioner

## 2018-05-26 ENCOUNTER — Other Ambulatory Visit: Payer: Self-pay | Admitting: Cardiovascular Disease

## 2018-05-26 MED ORDER — ATORVASTATIN CALCIUM 20 MG PO TABS: 20.0000 mg | ORAL_TABLET | Freq: Every day | ORAL | 2 refills | Status: DC

## 2018-06-03 LAB — CUP PACEART REMOTE DEVICE CHECK
Brady Statistic AP VP Percent: 4.1 %
Brady Statistic RA Percent Paced: 4.08 %
HIGH POWER IMPEDANCE MEASURED VALUE: 63 Ohm
Implantable Lead Implant Date: 20181108
Implantable Lead Location: 753859
Implantable Lead Location: 753860
Implantable Lead Model: 5076
Lead Channel Impedance Value: 1064 Ohm
Lead Channel Impedance Value: 1064 Ohm
Lead Channel Impedance Value: 184.154
Lead Channel Impedance Value: 188.1 Ohm
Lead Channel Impedance Value: 269.677
Lead Channel Impedance Value: 418 Ohm
Lead Channel Impedance Value: 532 Ohm
Lead Channel Impedance Value: 532 Ohm
Lead Channel Impedance Value: 608 Ohm
Lead Channel Impedance Value: 608 Ohm
Lead Channel Impedance Value: 665 Ohm
Lead Channel Impedance Value: 760 Ohm
Lead Channel Pacing Threshold Amplitude: 0.625 V
Lead Channel Pacing Threshold Amplitude: 2.75 V
Lead Channel Pacing Threshold Pulse Width: 0.4 ms
Lead Channel Pacing Threshold Pulse Width: 0.5 ms
Lead Channel Sensing Intrinsic Amplitude: 14.375 mV
Lead Channel Sensing Intrinsic Amplitude: 14.375 mV
Lead Channel Sensing Intrinsic Amplitude: 2.625 mV
Lead Channel Sensing Intrinsic Amplitude: 2.625 mV
Lead Channel Setting Pacing Amplitude: 2 V
Lead Channel Setting Pacing Amplitude: 2.5 V
Lead Channel Setting Pacing Amplitude: 3.75 V
Lead Channel Setting Pacing Pulse Width: 0.5 ms
Lead Channel Setting Sensing Sensitivity: 0.3 mV
MDC IDC LEAD IMPLANT DT: 20181108
MDC IDC LEAD IMPLANT DT: 20181108
MDC IDC LEAD LOCATION: 753858
MDC IDC MSMT BATTERY REMAINING LONGEVITY: 78 mo
MDC IDC MSMT BATTERY VOLTAGE: 3 V
MDC IDC MSMT LEADCHNL LV IMPEDANCE VALUE: 1007 Ohm
MDC IDC MSMT LEADCHNL LV IMPEDANCE VALUE: 235.862
MDC IDC MSMT LEADCHNL LV IMPEDANCE VALUE: 261.639
MDC IDC MSMT LEADCHNL LV IMPEDANCE VALUE: 342 Ohm
MDC IDC MSMT LEADCHNL LV IMPEDANCE VALUE: 399 Ohm
MDC IDC MSMT LEADCHNL RA IMPEDANCE VALUE: 456 Ohm
MDC IDC MSMT LEADCHNL RA PACING THRESHOLD AMPLITUDE: 0.625 V
MDC IDC MSMT LEADCHNL RV PACING THRESHOLD PULSEWIDTH: 0.4 ms
MDC IDC PG IMPLANT DT: 20181108
MDC IDC SESS DTM: 20191120051704
MDC IDC SET LEADCHNL RV PACING PULSEWIDTH: 0.4 ms
MDC IDC STAT BRADY AP VS PERCENT: 0.05 %
MDC IDC STAT BRADY AS VP PERCENT: 89.63 %
MDC IDC STAT BRADY AS VS PERCENT: 6.22 %
MDC IDC STAT BRADY RV PERCENT PACED: 30.52 %

## 2018-06-15 ENCOUNTER — Telehealth: Payer: Self-pay

## 2018-06-15 NOTE — Telephone Encounter (Signed)
Spoke with pt, informed him the new AF pt agreeable to apt with WC on 06/19/2017 at 11:15

## 2018-06-15 NOTE — Telephone Encounter (Signed)
LVM for pt to return call to DC regarding new AF, pt needs apt slot held for pt on 06/19/2018 at 11:15 w/ WC.

## 2018-06-18 ENCOUNTER — Encounter: Payer: Self-pay | Admitting: *Deleted

## 2018-06-19 ENCOUNTER — Encounter: Payer: Self-pay | Admitting: Cardiology

## 2018-06-19 ENCOUNTER — Telehealth: Payer: Self-pay | Admitting: Rheumatology

## 2018-06-19 ENCOUNTER — Encounter: Payer: Medicare Other | Admitting: Cardiology

## 2018-06-19 ENCOUNTER — Ambulatory Visit (INDEPENDENT_AMBULATORY_CARE_PROVIDER_SITE_OTHER): Payer: Medicare Other | Admitting: Cardiology

## 2018-06-19 VITALS — BP 124/72 | HR 68 | Ht 67.5 in | Wt 194.0 lb

## 2018-06-19 DIAGNOSIS — I482 Chronic atrial fibrillation, unspecified: Secondary | ICD-10-CM | POA: Diagnosis not present

## 2018-06-19 DIAGNOSIS — I5022 Chronic systolic (congestive) heart failure: Secondary | ICD-10-CM | POA: Diagnosis not present

## 2018-06-19 DIAGNOSIS — Z79899 Other long term (current) drug therapy: Secondary | ICD-10-CM | POA: Diagnosis not present

## 2018-06-19 DIAGNOSIS — I4891 Unspecified atrial fibrillation: Secondary | ICD-10-CM | POA: Diagnosis not present

## 2018-06-19 MED ORDER — APIXABAN 5 MG PO TABS: 5.0000 mg | ORAL_TABLET | Freq: Two times a day (BID) | ORAL | 0 refills | Status: DC

## 2018-06-19 MED ORDER — APIXABAN 5 MG PO TABS: 5.0000 mg | ORAL_TABLET | Freq: Two times a day (BID) | ORAL | 2 refills | Status: DC

## 2018-06-19 NOTE — Progress Notes (Signed)
Thanks Will. °

## 2018-06-19 NOTE — Telephone Encounter (Signed)
Patient contacted the office requesting a refill on tramadol. We have not given patient a a prescription refill since May 2018. Patient advised that I would speak with Dr. Estanislado Pandy and if she is in agreement then he would need to come in and update UDS and narcotic agreement.   Last Visit: 02/04/18 Next Visit: 07/07/18 UDS: 05/02/17 Narc agreement: 06/17/16   Please advise

## 2018-06-19 NOTE — Patient Instructions (Addendum)
Medication Instructions:  Your physician has recommended you make the following change in your medication:  1. START Eliquis 5 mg twice a day 2. STOP Aspirin  *If you need a refill on your cardiac medications before your next appointment, please call your pharmacy*  Labwork: CBC & BMET in several week.  You  Will have this done at your appointment with Dr. Burt Knack  Testing/Procedures: None ordered  Follow-Up: Remote monitoring is used to monitor your Pacemaker or ICD from home. This monitoring reduces the number of office visits required to check your device to one time per year. It allows [place] to keep an eye on the functioning of your device to ensure it is working properly. You are scheduled for a device check from home on 07/08/2018. You may send your transmission at any time that day. If you have a wireless device, the transmission will be sent automatically. After your physician reviews your transmission, you will receive a postcard with your next transmission date.  Your physician wants you to follow-up in: 6 months with Dr. Curt Bears.  You will receive a reminder letter in the mail two months in advance. If you don't receive a letter, please call our office to schedule the follow-up appointment.  Thank you for choosing [facility]!!   Trinidad Curet, RN [phone]

## 2018-06-19 NOTE — Addendum Note (Signed)
Addended by: Stanton Kidney on: 06/19/2018 05:01 PM   Modules accepted: Orders

## 2018-06-19 NOTE — Telephone Encounter (Signed)
Patient request refill on Tramadol 50mg  sent to [facility] for 90 day supply.

## 2018-06-19 NOTE — Telephone Encounter (Signed)
I do not encourage use of long-term narcotics.  He may consider using Tylenol Extra Strength 2 tablets p.o. twice daily as needed.

## 2018-06-19 NOTE — Progress Notes (Signed)
Electrophysiology Office Note   Date:  06/19/2018   ID:  [name] 22-Jan-1946, MRN [id number]  PCP:  Biagio Borg, MD  Cardiologist:  Burt Knack Primary Electrophysiologist:  Dr Curt Bears    CC: Follow up for device check/new onset afib.   History of Present Illness: [name] is a 73 y.o. male who is being seen today for the evaluation of CHF at the request of [name], MD. Presenting today for electrophysiology evaluation. He has a history of chronic systolic heart failure, coronary artery disease status post stenting of the RCA, left bundle branch block. He also has sleep apnea and uses a CPAP at night.  Medtronic CRT-D implanted 03/27/17.  Implant was comp gated by microperforation.  He was put on colchicine and that greatly improved his symptoms. He recently had paroxysmal atrial fibrillation noted on his remote transmission which lasted 14 hours. He was completely unaware with no palpitations, SOB, or fatigue. There were no triggering factors that he could identify.     Today, denies symptoms of palpitations, chest pain, shortness of breath, orthopnea, PND, lower extremity edema, claudication, dizziness, presyncope, syncope, bleeding, or neurologic sequela. The patient is tolerating medications without difficulties.    Past Medical History:  Diagnosis Date  . AICD (automatic cardioverter/defibrillator) present   . Allergic rhinitis   . Allergy   . Anginal pain ([place]) 05/26/14   chest pain after chasing dog  . CAD (coronary artery disease)    hx of stent- 2005 RCA  . Cancer ([place])    skin cancer  . Cataract    beginning stage both eyes  . CHF (congestive heart failure) (H[facility])    pacemaker Medtronic    . Chronic back pain    intermittent  . Constipation   . Dizziness   . Dysrhythmia    right bundle branch block   . Esophageal stricture   . GERD (gastroesophageal reflux disease)   . Hemoptysis   . Hiatal hernia   . History of colonic polyps    hyperplastic    . HTN (hypertension)   . Hyperlipidemia   . Hypertrophy of prostate with urinary obstruction and other lower urinary tract symptoms (LUTS)   . OA (osteoarthritis)   . OSA (obstructive sleep apnea)    cpap- 10   . 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 Pneumonia   . Pre-diabetes    no medications  . Shortness of breath    with exertion  . Sleep apnea    cpap  . Testicular hypofunction    Past Surgical History:  Procedure Laterality Date  . BIV ICD INSERTION CRT-D N/A 03/27/2017   Procedure: BIV ICD INSERTION CRT-D;  Surgeon: Constance Haw, MD;  Location: [facility];  Service: Cardiovascular;  Laterality: N/A;  . CARDIAC CATHETERIZATION     '05, last 2009, showing patent RCA stent  . COLONOSCOPY  12/2007   HYPERPLASTIC POLYP  . COLONOSCOPY WITH PROPOFOL N/A 06/02/2014   Procedure: COLONOSCOPY WITH PROPOFOL;  Surgeon: [name], MD;  Location: [facility];  Service: Endoscopy;  Laterality: N/A;  . CORONARY ANGIOPLASTY  08/2003  . ESOPHAGOGASTRODUODENOSCOPY (EGD) WITH PROPOFOL N/A 06/02/2014   Procedure: ESOPHAGOGASTRODUODENOSCOPY (EGD) WITH PROPOFOL;  Surgeon: [name], MD;  Location: [facility];  Service: Endoscopy;  Laterality: N/A;  . hernia surgery x 3     Bilateral Inguinal, Umbicial  .  INSERTION OF MESH N/A 03/20/2018   Procedure: INSERTION OF MESH;  Surgeon: Alphonsa Overall, MD;  Location: [facility];  Service: General;  Laterality: N/A;  . IRRIGATION AND DEBRIDEMENT ABSCESS Left 08/14/2012   Procedure: IRRIGATION AND DEBRIDEMENT LEFT INGUINAL BOIL ;  Surgeon: Ailene Rud, MD;  Location: [facility];  Service: Urology;  Laterality: Left;  . JOINT REPLACEMENT Right 2017  . KNEE ARTHROPLASTY    . LEFT HEART CATHETERIZATION WITH CORONARY ANGIOGRAM N/A 05/26/2014   Procedure: LEFT HEART CATHETERIZATION WITH CORONARY ANGIOGRAM;  Surgeon: Blane Ohara, MD;  Location: [facility]  CATH LAB;  Service: Cardiovascular;  Laterality: N/A;  . LUMBAR LAMINECTOMY/DECOMPRESSION MICRODISCECTOMY Left 09/26/2014   Procedure: Lumbar Laminectomy for resection of synovial cyst  Lumbar five- sacral one left;  Surgeon: Kary Kos, MD;  Location: [facility]URO OR[facility];  Service: Neurosurgery;  Laterality: Left;  . MOLE REMOVAL Left 03/20/2018   Procedure: MOLE REMOVAL;  Surgeon: Alphonsa Overall, MD;  Location: [facility];  Service: General;  Laterality: Left;  . Oral surg to removed growth from ?sinus  10/2010   ?Dermoid removed by DrRiggs  . PACEMAKER INSERTION  04/03/2017  . POLYPECTOMY    . RCA stenting     '05 RCA  . right toe surgery  Right    Cyst   . RIGHT/LEFT HEART CATH AND CORONARY ANGIOGRAPHY N/A 01/15/2017   Procedure: RIGHT/LEFT HEART CATH AND CORONARY ANGIOGRAPHY;  Surgeon: [name], MD;  Location: [facility];  Service: Cardiovascular;  Laterality: N/A;  . s/p right knee arthroscopy  2005  . thumb surgery Right   . TOTAL KNEE ARTHROPLASTY Right 03/12/2016   Procedure: RIGHT TOTAL KNEE ARTHROPLASTY;  Surgeon: Paralee Cancel, MD;  Location: [facility];  Service: Orthopedics;  Laterality: Right;  . UPPER GASTROINTESTINAL ENDOSCOPY    . VENTRAL HERNIA REPAIR N/A 03/20/2018   Procedure: [place];  Surgeon: Alphonsa Overall, MD;  Location: [facility];  Service: General;  Laterality: N/A;     Current Outpatient Medications  Medication Sig Dispense Refill  . atorvastatin (LIPITOR) 20 MG tablet Take 1 tablet (20 mg total) by mouth daily. 90 tablet 2  . b complex vitamins tablet Take 1 tablet by mouth daily with lunch.     . calcium carbonate (OSCAL) 1500 (600 Ca) MG TABS tablet Take 600 mg by mouth every evening.    . carvedilol (COREG) 25 MG tablet Take 1 tablet (25 mg total) by mouth 2 (two) times daily. 180 tablet 2  . CINNAMON PO Take 1 tablet by mouth daily with lunch.     . Coenzyme Q10 (CO Q 10) 100 MG CAPS Take 1 capsule by mouth daily with  lunch.     . diclofenac sodium (VOLTAREN) 1 % GEL Apply 1 application topically 4 (four) times daily as needed (pain).   3  . ENTRESTO 49-51 MG TAKE 1 TABLET BY MOUTH 2 TIMES DAILY 923 tablet 3  . folic acid (FOLVITE) 300 MCG tablet Take 400 mcg by mouth daily.     . Glucosamine HCl (GLUCOSAMINE PO) Take 1,500 mg by mouth every evening.    Marland Kitchen HYDROcodone-acetaminophen (NORCO/VICODIN) 5-325 MG tablet Take 1-2 tablets by mouth every 4 (four) hours as needed for moderate pain. 30 tablet 0  . hydroxychloroquine (PLAQUENIL) 200 MG tablet Take one tablet (200 mg) twice daily Monday through Friday only. Do not take Saturday or Sundays. 120 tablet 0  . hydroxypropyl methylcellulose / hypromellose (ISOPTO TEARS / GONIOVISC)  2.5 % ophthalmic solution Place 1 drop into both eyes 4 (four) times daily as needed for dry eyes.     Marland Kitchen MAGNESIUM PO Take 1 tablet by mouth daily as needed (for cramping).     . Multiple Vitamin (MULTIVITAMIN) capsule Take 1 capsule by mouth daily.      . Multiple Vitamins-Minerals (OCUVITE PO) Take 1 tablet by mouth at bedtime.    . nabumetone (RELAFEN) 500 MG tablet Take 1 tablet (500 mg total) by mouth 2 (two) times daily. 180 tablet 0  . NON FORMULARY CPAP machine with sleep.    . Omega-3 Fatty Acids (FISH OIL) 500 MG CAPS Take 500 mg by mouth at bedtime.     . ondansetron (ZOFRAN-ODT) 4 MG disintegrating tablet Take 1 tablet (4 mg total) by mouth every 6 (six) hours as needed for nausea. 20 tablet 0  . pantoprazole (PROTONIX) 40 MG tablet TAKE 1 TABLET BY MOUTH EVERY DAY BEFORE BREAKFAST GENERIC EQUIVALENT FOR PROTONIX 90 tablet 3  . Probiotic Product (PROBIOTIC DAILY PO) Take 1 capsule by mouth daily with lunch.     . Saw Palmetto, Serenoa repens, (SAW PALMETTO PO) Take 1 capsule by mouth every evening.    Marland Kitchen spironolactone (ALDACTONE) 25 MG tablet TAKE 1/2 TABLET(12.5 MG) BY MOUTH DAILY (Patient taking differently: Take 12.5 mg by mouth daily. ) 15 tablet 9  . tamsulosin (FLOMAX)  0.4 MG CAPS capsule Take 0.4 mg by mouth at bedtime.    . Testosterone 20 % CREA Apply 2 mLs topically daily. Rub on shoulder    . traMADol (ULTRAM) 50 MG tablet Take 50 mg by mouth daily with lunch.     . TURMERIC PO Take 1 tablet by mouth daily with lunch.     Marland Kitchen apixaban (ELIQUIS) 5 MG TABS tablet Take 1 tablet (5 mg total) by mouth 2 (two) times daily. 180 tablet 2  . apixaban (ELIQUIS) 5 MG TABS tablet Take 1 tablet (5 mg total) by mouth 2 (two) times daily. 60 tablet 0   No current facility-administered medications for this visit.     Allergies:   Sulfonamide derivatives   Social History:  The patient  reports that he quit smoking about 28 years ago. He has never used smokeless tobacco. He reports current alcohol use. He reports that he does not use drugs.   Family History:  The patient's family history includes Arthritis in his brother and daughter; COPD in his brother; Cancer in his brother; Coronary artery disease in his father; Diabetes in his father; Heart disease in his father and mother; Heart failure in his brother; Hypertension in an other family member; Stomach cancer in his paternal grandmother; Sudden death in his father.   ROS:  Please see the history of present illness.   Otherwise, review of systems is positive for irregular beats.   All other systems are reviewed and negative.   PHYSICAL EXAM: VS:  BP 124/72   Pulse 68   Ht 5' 7.5" (1.715 m)   Wt 194 lb (88 kg)   BMI 29.94 kg/m  , BMI Body mass index is 29.94 kg/m. GEN: Well nourished, well developed, in no acute distress  HEENT: normal  Neck: no JVD, carotid bruits, or masses Cardiac: RRR; no murmurs, rubs, or gallops,no edema  Respiratory:  clear to auscultation bilaterally, normal work of breathing GI: soft, nontender, nondistended, + BS MS: no deformity or atrophy  Skin: warm and dry, device site well healed Neuro:  Strength and sensation  are intact Psych: euthymic mood, full affect  EKG:  EKG is ordered  today. Personal review of the ekg ordered shows A-sensed, V-paced rhythm HR 68  Personal review of the device interrogation today. Results in [facility]: 12/23/2017: ALT 19 12/31/2017: Pro B Natriuretic peptide (BNP) 26.0; TSH 3.52 03/12/2018: BUN 19; Creatinine, Ser 0.94; Hemoglobin 15.1; Platelets 244; Potassium 4.5; Sodium 138    Lipid Panel     Component Value Date/Time   CHOL 138 11/27/2016 0914   TRIG 188 (H) 11/27/2016 0914   HDL 35 (L) 11/27/2016 0914   CHOLHDL 3.9 11/27/2016 0914   CHOLHDL 4 12/07/2015 0742   VLDL 30.6 12/07/2015 0742   LDLCALC 65 11/27/2016 0914     Wt Readings from Last 3 Encounters:  06/19/18 194 lb (88 kg)  03/20/18 185 lb (83.9 kg)  03/12/18 190 lb 1.6 oz (86.2 kg)      Other studies Reviewed: Additional studies/ records that were reviewed today include: TTE 06/25/16 Review of the above records today demonstrates:  - Left ventricle: The cavity size was normal. Systolic function was   normal. The estimated ejection fraction was in the range of 55%   to 60%. Wall motion was normal; there were no regional wall   motion abnormalities. Doppler parameters are consistent with   abnormal left ventricular relaxation (grade 1 diastolic   dysfunction). - Ventricular septum: Septal motion showed dyssynergy. These   changes are consistent with a left bundle branch block. - Aortic valve: There was trivial regurgitation. - Atrial septum: There was increased thickness of the septum,   consistent with lipomatous hypertrophy.  LHC/RHC 01/05/17 1. Widely patent coronary arteries with continued patency of the stented segment in the right coronary artery 2. Mild nonobstructive coronary artery disease as detailed with mild stenosis of the proximal to mid LAD and otherwise minimal luminal irregularities 3. Well compensated right-sided cardiac hemodynamics  Device interrogation is reviewed today in detail.  See PaceArt for details.   ASSESSMENT AND  PLAN:  1.  Chronic systolic heart failure: This post Medtronic CRT D implanted 03/27/2017.  Has had improvement of his ejection fraction to 55 to 60%.  No changes in his device at this time.  He does say that if he gets ill with some serious medical condition that could be life-threatening that he would potentially want his defibrillator turned off.  No changes.    2. Paroxysmal atrial fibrillation: New diagnosis on remote transmission. He denies any symptoms.  We discussed his stroke risk and given his CHADS2VASC score, [name] start Eliquis 5 mg BID and stop ASA 81 mg daily. General precautions with anticoagulation discussed.  Check CBC/Bmet in one month.  This patients CHA2DS2-VASc Score and unadjusted Ischemic Stroke Rate (% per year) is equal to 4.8 % stroke rate/year from a score of 4  Above score calculated as 1 point each if present [CHF, HTN, DM, Vascular=MI/PAD/Aortic Plaque, Age if 65-74, or Male] Above score calculated as 2 points each if present [Age > 75, or Stroke/TIA/TE]   3. Hyperlipidemia: Continue statin therapy.  4. Hypertension: Stable, no changes today.  5. Coronary artery disease, native vessel, without angina: No anginal symptoms. Continue risk factor modifications.   Current medicines are reviewed at length with the patient today.   The patient does not have concerns regarding his medicines.  The following changes were made today: stop ASA, start Eliquis  Labs/ tests ordered today include:  Orders Placed This Encounter  Procedures  .  EKG 12-Lead     Disposition:   FU with Ambre Kobayashi 6 months  Johann Capers, [place]  06/19/2018 1:51 PM     [facility] [address] [place] [address] [phone] (office) [phone] (fax)   I have seen and examined this patient with Adline Peals.  Agree with above, note added to reflect my findings.  On exam, RRR, no murmus, lungs clear.  Fortunately he has developed atrial  fibrillation.  He is developed up to 14 hours at a time.  We Daviyon Widmayer thus plan to stop his aspirin and start Eliquis today.  He was unaware of his episodes of atrial fibrillation.  Discussed with primary cardiology  Riyan Gavina M. Orian Figueira MD 06/19/2018 2:21 PM

## 2018-06-22 NOTE — Telephone Encounter (Signed)
Patient advised Dr Estanislado Pandy does not encourage use of long-term narcotics.  He may consider using Tylenol Extra Strength 2 tablets p.o. twice daily as needed. Patient advised he would not need to come to office for a UDS. Patient verbalized understanding.

## 2018-06-23 LAB — CUP PACEART INCLINIC DEVICE CHECK
Battery Remaining Longevity: 87 mo
Battery Voltage: 2.98 V
Brady Statistic AS VP Percent: 89.22 %
Brady Statistic RA Percent Paced: 4.73 %
Brady Statistic RV Percent Paced: 32 %
Date Time Interrogation Session: 20200131174533
HighPow Impedance: 65 Ohm
Implantable Lead Implant Date: 20181108
Implantable Lead Implant Date: 20181108
Implantable Lead Location: 753859
Implantable Lead Location: 753860
Implantable Lead Model: 4298
Implantable Lead Model: 5076
Lead Channel Impedance Value: 1026 Ohm
Lead Channel Impedance Value: 1064 Ohm
Lead Channel Impedance Value: 254.534
Lead Channel Impedance Value: 276.59 Ohm
Lead Channel Impedance Value: 361 Ohm
Lead Channel Impedance Value: 399 Ohm
Lead Channel Impedance Value: 456 Ohm
Lead Channel Impedance Value: 551 Ohm
Lead Channel Impedance Value: 665 Ohm
Lead Channel Pacing Threshold Amplitude: 0.625 V
Lead Channel Pacing Threshold Amplitude: 0.625 V
Lead Channel Pacing Threshold Pulse Width: 0.4 ms
Lead Channel Sensing Intrinsic Amplitude: 2.875 mV
Lead Channel Setting Pacing Amplitude: 2 V
Lead Channel Setting Pacing Amplitude: 2.5 V
Lead Channel Setting Pacing Amplitude: 2.5 V
Lead Channel Setting Pacing Pulse Width: 0.4 ms
Lead Channel Setting Sensing Sensitivity: 0.3 mV
MDC IDC LEAD IMPLANT DT: 20181108
MDC IDC LEAD LOCATION: 753858
MDC IDC MSMT LEADCHNL LV IMPEDANCE VALUE: 189.525
MDC IDC MSMT LEADCHNL LV IMPEDANCE VALUE: 201.488
MDC IDC MSMT LEADCHNL LV IMPEDANCE VALUE: 238.518
MDC IDC MSMT LEADCHNL LV IMPEDANCE VALUE: 532 Ohm
MDC IDC MSMT LEADCHNL LV IMPEDANCE VALUE: 608 Ohm
MDC IDC MSMT LEADCHNL LV IMPEDANCE VALUE: 703 Ohm
MDC IDC MSMT LEADCHNL LV IMPEDANCE VALUE: 950 Ohm
MDC IDC MSMT LEADCHNL LV PACING THRESHOLD AMPLITUDE: 3.25 V
MDC IDC MSMT LEADCHNL LV PACING THRESHOLD PULSEWIDTH: 0.5 ms
MDC IDC MSMT LEADCHNL RA IMPEDANCE VALUE: 418 Ohm
MDC IDC MSMT LEADCHNL RA PACING THRESHOLD PULSEWIDTH: 0.4 ms
MDC IDC MSMT LEADCHNL RA SENSING INTR AMPL: 3 mV
MDC IDC MSMT LEADCHNL RV IMPEDANCE VALUE: 608 Ohm
MDC IDC MSMT LEADCHNL RV SENSING INTR AMPL: 11.875 mV
MDC IDC MSMT LEADCHNL RV SENSING INTR AMPL: 12 mV
MDC IDC PG IMPLANT DT: 20181108
MDC IDC SET LEADCHNL LV PACING PULSEWIDTH: 1 ms
MDC IDC STAT BRADY AP VP PERCENT: 4.77 %
MDC IDC STAT BRADY AP VS PERCENT: 0.06 %
MDC IDC STAT BRADY AS VS PERCENT: 5.94 %

## 2018-06-24 DIAGNOSIS — N401 Enlarged prostate with lower urinary tract symptoms: Secondary | ICD-10-CM | POA: Diagnosis not present

## 2018-06-24 NOTE — Progress Notes (Signed)
Office Visit Note  Patient: [name]             Date of Birth: 19-Feb-1946           MRN: [id number]             PCP: Biagio Borg, MD Referring: [name], MD Visit Date: 07/07/2018 Occupation: @GUAROCC @  Subjective:  Pain in multiple joints   History of Present Illness: [name] is a 73 y.o. male with history of inflammatory arthritis, osteoarthritis, and DDD.  Patient is on Plaquenil 200 mg twice daily Monday through Friday.  He is unsure if he has noticed benefit since starting on PLQ.  He is tolerating it well.  She continues to have chronic pain in both hands, both knees, and lower back.  He states he has intermittent swelling in the left knee joint.  He states he is still considering a knee replacement but will have to be cleared by his cardiologist.  He reports within the past year he was diagnosed with Afib and has a pacemaker.  He reports he was previously taking tramadol for pain relief but reports he was unable to get it refilled and is frustrated.    Activities of Daily Living:  Patient reports morning stiffness   all day.   Patient Reports nocturnal pain.  Difficulty dressing/grooming: Denies Difficulty climbing stairs: Reports Difficulty getting out of chair: Reports Difficulty using hands for taps, buttons, cutlery, and/or writing: Reports  Review of Systems  Constitutional: Positive for fatigue. Negative for night sweats.  HENT: Negative for mouth sores, mouth dryness and nose dryness.   Eyes: Positive for dryness. Negative for photophobia, redness and visual disturbance.  Respiratory: Negative for cough, hemoptysis, shortness of breath and difficulty breathing.   Cardiovascular: Positive for palpitations. Negative for chest pain, hypertension, irregular heartbeat and swelling in legs/feet.  Gastrointestinal: Negative for blood in stool, constipation and diarrhea.  Endocrine: Negative for increased urination.  Genitourinary: Negative for painful  urination.  Musculoskeletal: Positive for arthralgias, joint pain, joint swelling and morning stiffness. Negative for myalgias, muscle weakness, muscle tenderness and myalgias.  Skin: Negative for color change, rash, hair loss, nodules/bumps, skin tightness, ulcers and sensitivity to sunlight.  Allergic/Immunologic: Negative for susceptible to infections.  Neurological: Negative for dizziness, fainting, memory loss, night sweats and weakness.  Hematological: Negative for swollen glands.  Psychiatric/Behavioral: Negative for depressed mood and sleep disturbance. The patient is not nervous/anxious.     PMFS History:  Patient Active Problem List   Diagnosis Date Noted  . Preventative health care 06/25/2018  . Incarcerated ventral hernia 03/20/2018  . DCM (dilated cardiomyopathy) ([place]) 02/11/2017  . Chronic systolic heart failure ([place]) 01/15/2017  . History of pneumonia 10/23/2016  . Osteoarthritis 10/23/2016  . Primary osteoarthritis of both feet 10/04/2016  . Primary osteoarthritis of left knee 10/04/2016  . DDD (degenerative disc disease), lumbar 10/04/2016  . Hemoptysis 05/27/2016  . Overweight (BMI 25.0-29.9) 03/14/2016  . 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  . Parotid mass 08/23/2015  . Dyspnea 11/29/2014  . Synovial cyst of lumbar facet joint 09/26/2014  . Incisional hernia, without obstruction or gangrene 12/06/2013  . Nausea alone 08/27/2013  . Colon cancer screening 08/27/2013  . 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 Benign prostatic hyperplasia with urinary obstruction 06/18/2010  . Other specified disorder of stomach and duodenum 12/16/2008  . 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 GERD 09/02/2008  . COLONIC POLYPS, HYPERPLASTIC 05/24/2007  . Obstructive sleep  apnea 05/24/2007  . ALLERGIC RHINITIS 05/24/2007  . ESOPHAGEAL STRICTURE 05/24/2007  . HIATAL HERNIA 05/24/2007  . Hyperlipidemia 02/06/2007  . Essential hypertension 02/06/2007  . CAD (coronary  artery disease) 02/06/2007  . Primary osteoarthritis of both hands 02/06/2007  . Neck pain on right side 02/06/2007    Past Medical History:  Diagnosis Date  . AICD (automatic cardioverter/defibrillator) present   . Allergic rhinitis   . Allergy   . Anginal pain ([place]) 05/26/14   chest pain after chasing dog  . CAD (coronary artery disease)    hx of stent- 2005 RCA  . Cancer ([place])    skin cancer  . Cataract    beginning stage both eyes  . CHF (congestive heart failure) ([facility])    pacemaker [employer]    . Chronic back pain    intermittent  . Constipation   . Dizziness   . Dysrhythmia    right bundle branch block   . Esophageal stricture   . GERD (gastroesophageal reflux disease)   . Hemoptysis   . Hiatal hernia   . History of colonic polyps    hyperplastic  . HTN (hypertension)   . Hyperlipidemia   . Hypertrophy of prostate with urinary obstruction and other lower urinary tract symptoms (LUTS)   . OA (osteoarthritis)   . OSA (obstructive sleep apnea)    cpap- 10   . 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 Pneumonia   . Pre-diabetes    no medications  . Shortness of breath    with exertion  . Sleep apnea    cpap  . Testicular hypofunction     Family History  Problem Relation Age of Onset  . Coronary artery disease Father   . Diabetes Father   . Sudden death Father        due to heart disease  . Heart disease Father   . Heart disease Mother   . Hypertension Other   . Stomach cancer Paternal Grandmother   . COPD Brother   . Arthritis Brother   . Heart failure Brother   . Cancer Brother        lung   . Arthritis Daughter   . Colon cancer Neg Hx   . Esophageal cancer Neg Hx   . Colon polyps Neg Hx   . Ulcerative colitis Neg Hx    Past Surgical History:  Procedure Laterality Date  . BIV ICD INSERTION CRT-D N/A 03/27/2017   Procedure: BIV ICD INSERTION CRT-D;  Surgeon:  Constance Haw, MD;  Location: [facility];  Service: Cardiovascular;  Laterality: N/A;  . CARDIAC CATHETERIZATION     '05, last 2009, showing patent RCA stent  . COLONOSCOPY  12/2007   HYPERPLASTIC POLYP  . COLONOSCOPY WITH PROPOFOL N/A 06/02/2014   Procedure: COLONOSCOPY WITH PROPOFOL;  Surgeon: [name], MD;  Location: [facility];  Service: Endoscopy;  Laterality: N/A;  . CORONARY ANGIOPLASTY  08/2003  . ESOPHAGOGASTRODUODENOSCOPY (EGD) WITH PROPOFOL N/A 06/02/2014   Procedure: ESOPHAGOGASTRODUODENOSCOPY (EGD) WITH PROPOFOL;  Surgeon: [name], MD;  Location: [facility];  Service: Endoscopy;  Laterality: N/A;  . hernia surgery x 3     Bilateral Inguinal, Umbicial  . INSERTION OF MESH N/A 03/20/2018   Procedure: INSERTION OF MESH;  Surgeon: Alphonsa Overall, MD;  Location: [facility];  Service: General;  Laterality: N/A;  . IRRIGATION AND DEBRIDEMENT ABSCESS Left 08/14/2012  Procedure: IRRIGATION AND DEBRIDEMENT LEFT INGUINAL BOIL ;  Surgeon: Ailene Rud, MD;  Location: [facility];  Service: Urology;  Laterality: Left;  . JOINT REPLACEMENT Right 2017  . KNEE ARTHROPLASTY    . LEFT HEART CATHETERIZATION WITH CORONARY ANGIOGRAM N/A 05/26/2014   Procedure: LEFT HEART CATHETERIZATION WITH CORONARY ANGIOGRAM;  Surgeon: Blane Ohara, MD;  Location: [facility];  Service: Cardiovascular;  Laterality: N/A;  . LUMBAR LAMINECTOMY/DECOMPRESSION MICRODISCECTOMY Left 09/26/2014   Procedure: Lumbar Laminectomy for resection of synovial cyst  Lumbar five- sacral one left;  Surgeon: Kary Kos, MD;  Location: [facility];  Service: Neurosurgery;  Laterality: Left;  . MOLE REMOVAL Left 03/20/2018   Procedure: MOLE REMOVAL;  Surgeon: Alphonsa Overall, MD;  Location: [facility];  Service: General;  Laterality: Left;  . Oral surg to removed growth from ?sinus  10/2010   ?Dermoid removed by DrRiggs  . PACEMAKER INSERTION  04/03/2017  . POLYPECTOMY    . RCA stenting     '05 RCA  . right toe  surgery  Right    Cyst   . RIGHT/LEFT HEART CATH AND CORONARY ANGIOGRAPHY N/A 01/15/2017   Procedure: RIGHT/LEFT HEART CATH AND CORONARY ANGIOGRAPHY;  Surgeon: [name], MD;  Location: [facility];  Service: Cardiovascular;  Laterality: N/A;  . s/p right knee arthroscopy  2005  . thumb surgery Right   . TOTAL KNEE ARTHROPLASTY Right 03/12/2016   Procedure: RIGHT TOTAL KNEE ARTHROPLASTY;  Surgeon: Paralee Cancel, MD;  Location: [facility];  Service: Orthopedics;  Laterality: Right;  . UPPER GASTROINTESTINAL ENDOSCOPY    . VENTRAL HERNIA REPAIR N/A 03/20/2018   Procedure: LAPAROSCOPIC VENTRAL INCISIONAL  HERNIA ERAS PATHWAY;  Surgeon: Alphonsa Overall, MD;  Location: [facility];  Service: General;  Laterality: N/A;   Social History   Social History Narrative  . Not on file   Immunization History  Administered Date(s) Administered  . Influenza Split 03/21/2011  . Influenza Whole 04/06/2009, 04/09/2010  . Influenza, High Dose Seasonal PF 04/20/2015, 04/15/2016, 02/21/2017  . Influenza,inj,Quad PF,6+ Mos 03/24/2014  . Influenza-Unspecified 04/20/2015, 02/26/2018  . Pneumococcal Conjugate-13 11/29/2014  . Pneumococcal Polysaccharide-23 06/25/2018  . Tdap 11/29/2014  . Zoster 05/20/2006  . Zoster Recombinat (Shingrix) 06/07/2018     Objective: Vital Signs: BP 129/71 (BP Location: Left Arm, Patient Position: Sitting, Cuff Size: Normal)   Pulse 76   Resp 13   Ht 5\' 8"  (1.727 m)   Wt 193 lb (87.5 kg)   BMI 29.35 kg/m    Physical Exam Vitals signs and nursing note reviewed.  Constitutional:      Appearance: He is well-developed.  HENT:     Head: Normocephalic and atraumatic.  Eyes:     Conjunctiva/sclera: Conjunctivae normal.     Pupils: Pupils are equal, round, and reactive to light.  Neck:     Musculoskeletal: Normal range of motion and neck supple.  Cardiovascular:     Rate and Rhythm: Normal rate and regular rhythm.     Heart sounds: Normal heart sounds.  Pulmonary:      Effort: Pulmonary effort is normal.     Breath sounds: Normal breath sounds.  Abdominal:     General: Bowel sounds are normal.     Palpations: Abdomen is soft.  Lymphadenopathy:     Cervical: No cervical adenopathy.  Skin:    General: Skin is warm and dry.     Capillary Refill: Capillary refill takes less than 2 seconds.  Neurological:  Mental Status: He is alert and oriented to person, place, and time.  Psychiatric:        Behavior: Behavior normal.    Musculoskeletal Exam: C-spine slightly limited range of motion.  Thoracic and lumbar spine to limit range of motion with discomfort.  No midline spinal tenderness.  No SI joint tenderness.  Shoulder joints, with joints, wrist joints good range of motion with no discomfort.  He has tenderness of the right wrist on exam.  He has severe PIP and DIP synovial thickening consistent with osteoarthritis.  He has no MCP joint synovitis or tenderness on exam.  He has synovial thickening of bilateral first and second MCP joints but no synovitis.  Hip joints good range of motion with no discomfort.  Right knee replacement has mild warmth.  Left knee has mild warmth on exam.  Ankle joints, MTPs, PIPs, DIPs good range of motion with no synovitis.  No warmth or effusion bilateral knee joints.  CDAI Exam: CDAI Score: Not documented Patient Global Assessment: Not documented; Provider Global Assessment: Not documented Swollen: Not documented; Tender: Not documented Joint Exam   Not documented   There is currently no information documented on the homunculus. Go to the Rheumatology activity and complete the homunculus joint exam.  Investigation: No additional findings.  Imaging: No results found.  Recent Labs: Lab Results  Component Value Date   WBC 5.5 03/12/2018   HGB 15.1 03/12/2018   PLT 244 03/12/2018   NA 138 03/12/2018   K 4.5 03/12/2018   CL 109 03/12/2018   CO2 22 03/12/2018   GLUCOSE 104 (H) 03/12/2018   BUN 19 03/12/2018    CREATININE 0.94 03/12/2018   BILITOT 0.7 12/23/2017   ALKPHOS 78 10/23/2016   AST 22 12/23/2017   ALT 19 12/23/2017   PROT 6.3 12/23/2017   ALBUMIN 4.4 10/23/2016   CALCIUM 9.6 03/12/2018   GFRAA >60 03/12/2018    Speciality Comments: PLQ eye exam: 02/23/2018 normal. Dr. Francis Dowse. Follow up in 1 year.  Procedures:  No procedures performed Allergies: Sulfonamide derivatives    Assessment / Plan:     Visit Diagnoses: Inflammatory arthritis - CCP +, RF -, 14-3-3 eta negative, Sed rate WNL: He has no synovitis on exam.  He continues to have chronic pain in bilateral hands, bilateral knees, and lower back.  He is taking Plaquenil 200 mg 1 tablet twice daily Monday through Friday.  He has been tolerating it well and has not missed any doses recently.  He is unsure how effective Plaquenil has been.  He has no active inflammation at this time.  He will continue on his current treatment regimen.  He does not need any refills at this time.  We will obtain lab work today.  He is advised to notify us if he has increased joint pain or joint swelling.  He will follow-up in the office in 5 months.  High risk medication use - PLQ 200 mg 1 tablet BID M-F. eye exam: 02/23/2018. CBC and CMP were drawn today to monitor for drug toxicity. - Plan: COMPLETE METABOLIC PANEL WITH GFR, CBC with Differential/Platelet  Primary osteoarthritis of both hands: He has severe PIP and DIP synovial thickening.  Bilateral CMC synovial thickening.  Joint protection and muscle strengthening were discussed.  He has chronic pain in multiple joints.  He was previously taking tramadol for pain relief.  He took 1 tablet by mouth daily PRN for pain relief.  He is requesting a refill.  We will  update urine drug screen and narcotic agreement.  He will fill out an opioid risk tool.  Once the UDS is updated we will send in a refill of tramadol 50 mg 1 tablet by mouth at bedtime PRN dispense 30 tablets with zero refills.   Primary  osteoarthritis of left knee: Chronic pain.  He has mild warmth on exam.  He would like to proceed with a left total arthroplasty but will require clearance by his cardiologist.   Status post right knee replacement: Chronic pain.  He has mild warmth.  He has good ROM with no discomfort.  Primary osteoarthritis of both feet: He has osteoarthritic changes of both feet.  He has no discomfort at this time.  He wears proper fitting shoes.   DDD (degenerative disc disease), lumbar: Chronic pain   Other medical conditions are listed as follows:   History of hypertension  History of CHF (congestive heart failure)  History of sleep apnea  History of gastric polyp  History of gastroesophageal reflux (GERD)  History of hyperlipidemia  History of BPH  History of colon polyps  History of coronary artery disease   Orders: Orders Placed This Encounter  Procedures  . COMPLETE METABOLIC PANEL WITH GFR  . CBC with Differential/Platelet  . Pain Mgmt, Tramadol w/medMATCH, U  . Pain Mgmt, Profile 5 w/Conf, U   No orders of the defined types were placed in this encounter.   Face-to-face time spent with patient was 30 minutes. Greater than 50% of time was spent in counseling and coordination of care.  Follow-Up Instructions: Return in about 5 months (around 12/05/2018) for Inflammatory arthritis .   Ofilia Neas, PA-C   I examined and evaluated the patient with Hazel Sams PA.  Patient had no synovitis on examination today.  His inflammatory arthritis is well controlled with Plaquenil.  I have advised him to discontinue nabumetone as he started anticoagulant.  He requested prescription refill on tramadol.  He states the last narcotic prescription he had from was a Psychologist, sport and exercise and November which she never took.  Have advised him as to him to contact us in future for any time he gets any prescriptions from other providers.  Narcotic agreement was renewed.  UDS was obtained.  We will refill his  tramadol as discussed 30 tablets total, 1 tablet p.o. nightly as needed with no refills.  The plan of care was discussed as noted above.  Bo Merino, MD  Note - This record has been created using Editor, commissioning.  Chart creation errors have been sought, but may not always  have been located. Such creation errors do not reflect on  the standard of medical care.

## 2018-06-25 ENCOUNTER — Ambulatory Visit (INDEPENDENT_AMBULATORY_CARE_PROVIDER_SITE_OTHER): Payer: Medicare Other | Admitting: Internal Medicine

## 2018-06-25 ENCOUNTER — Encounter: Payer: Self-pay | Admitting: Internal Medicine

## 2018-06-25 VITALS — BP 138/86 | HR 87 | Temp 98.9°F | Ht 67.5 in | Wt 194.0 lb

## 2018-06-25 DIAGNOSIS — I5022 Chronic systolic (congestive) heart failure: Secondary | ICD-10-CM

## 2018-06-25 DIAGNOSIS — Z0000 Encounter for general adult medical examination without abnormal findings: Secondary | ICD-10-CM | POA: Insufficient documentation

## 2018-06-25 DIAGNOSIS — I10 Essential (primary) hypertension: Secondary | ICD-10-CM | POA: Diagnosis not present

## 2018-06-25 DIAGNOSIS — E785 Hyperlipidemia, unspecified: Secondary | ICD-10-CM

## 2018-06-25 DIAGNOSIS — Z23 Encounter for immunization: Secondary | ICD-10-CM | POA: Diagnosis not present

## 2018-06-25 NOTE — Assessment & Plan Note (Signed)
stable overall by history and exam, recent data reviewed with pt, and pt to continue medical treatment as before,  to f/u any worsening symptoms or concerns  

## 2018-06-25 NOTE — Patient Instructions (Addendum)
Ou had the Pneumovax pneumonia shot  Please continue all other medications as before, and refills have been done if requested.  Please have the pharmacy call with any other refills you may need.  Please continue your efforts at being more active, low cholesterol diet, and weight control.  You are otherwise up to date with prevention measures today.  Please keep your appointments with your specialists as you may have planned  Please return in 6 months, or sooner if needed

## 2018-06-25 NOTE — Progress Notes (Signed)
Subjective:    Patient ID: [name], male    DOB: 12/10/1945, 73 y.o.   MRN: [id number]  HPI  Here as new pt to establish; overall doing ok,  Pt denies chest pain, increasing sob or doe, wheezing, orthopnea, PND, increased LE swelling, palpitations, dizziness or syncope.  Pt denies new neurological symptoms such as new headache, or facial or extremity weakness or numbness.  Pt denies polydipsia, polyuria, or low sugar episode.  Pt states overall good compliance with meds, mostly trying to follow appropriate diet, with wt overall stable,  but little exercise however. No new complaints Past Medical History:  Diagnosis Date  . AICD (automatic cardioverter/defibrillator) present   . Allergic rhinitis   . Allergy   . Anginal pain ([place]) 05/26/14   chest pain after chasing dog  . CAD (coronary artery disease)    hx of stent- 2005 RCA  . Cancer ([place])    skin cancer  . Cataract    beginning stage both eyes  . CHF (congestive heart failure) ([facility])    pacemaker [employer]    . Chronic back pain    intermittent  . Constipation   . Dizziness   . Dysrhythmia    right bundle branch block   . Esophageal stricture   . GERD (gastroesophageal reflux disease)   . Hemoptysis   . Hiatal hernia   . History of colonic polyps    hyperplastic  . HTN (hypertension)   . Hyperlipidemia   . Hypertrophy of prostate with urinary obstruction and other lower urinary tract symptoms (LUTS)   . OA (osteoarthritis)   . OSA (obstructive sleep apnea)    cpap- 10   . 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 Pneumonia   . Pre-diabetes    no medications  . Shortness of breath    with exertion  . Sleep apnea    cpap  . Testicular hypofunction    Past Surgical History:  Procedure Laterality Date  . BIV ICD INSERTION CRT-D N/A 03/27/2017   Procedure: BIV ICD INSERTION CRT-D;  Surgeon: Constance Haw, MD;  Location:  [facility];  Service: Cardiovascular;  Laterality: N/A;  . CARDIAC CATHETERIZATION     '05, last 2009, showing patent RCA stent  . COLONOSCOPY  12/2007   HYPERPLASTIC POLYP  . COLONOSCOPY WITH PROPOFOL N/A 06/02/2014   Procedure: COLONOSCOPY WITH PROPOFOL;  Surgeon: [name], MD;  Location: [facility];  Service: Endoscopy;  Laterality: N/A;  . CORONARY ANGIOPLASTY  08/2003  . ESOPHAGOGASTRODUODENOSCOPY (EGD) WITH PROPOFOL N/A 06/02/2014   Procedure: ESOPHAGOGASTRODUODENOSCOPY (EGD) WITH PROPOFOL;  Surgeon: [name], MD;  Location: [facility];  Service: Endoscopy;  Laterality: N/A;  . hernia surgery x 3     Bilateral Inguinal, Umbicial  . INSERTION OF MESH N/A 03/20/2018   Procedure: INSERTION OF MESH;  Surgeon: Alphonsa Overall, MD;  Location: [place];  Service: General;  Laterality: N/A;  . IRRIGATION AND DEBRIDEMENT ABSCESS Left 08/14/2012   Procedure: IRRIGATION AND DEBRIDEMENT LEFT INGUINAL BOIL ;  Surgeon: Ailene Rud, MD;  Location: [facility];  Service: Urology;  Laterality: Left;  . JOINT REPLACEMENT Right 2017  . KNEE ARTHROPLASTY    . LEFT HEART CATHETERIZATION WITH CORONARY ANGIOGRAM N/A 05/26/2014   Procedure: LEFT HEART CATHETERIZATION WITH CORONARY ANGIOGRAM;  Surgeon: Blane Ohara, MD;  Location: [facility];  Service: Cardiovascular;  Laterality: N/A;  .  LUMBAR LAMINECTOMY/DECOMPRESSION MICRODISCECTOMY Left 09/26/2014   Procedure: Lumbar Laminectomy for resection of synovial cyst  Lumbar five- sacral one left;  Surgeon: Kary Kos, MD;  Location: [facility];  Service: Neurosurgery;  Laterality: Left;  . MOLE REMOVAL Left 03/20/2018   Procedure: MOLE REMOVAL;  Surgeon: Alphonsa Overall, MD;  Location: [place];  Service: General;  Laterality: Left;  . Oral surg to removed growth from ?sinus  10/2010   ?Dermoid removed by DrRiggs  . PACEMAKER INSERTION  04/03/2017  . POLYPECTOMY    . RCA stenting     '05 RCA  . right toe surgery  Right    Cyst   . RIGHT/LEFT  HEART CATH AND CORONARY ANGIOGRAPHY N/A 01/15/2017   Procedure: RIGHT/LEFT HEART CATH AND CORONARY ANGIOGRAPHY;  Surgeon: [name], MD;  Location: [facility];  Service: Cardiovascular;  Laterality: N/A;  . s/p right knee arthroscopy  2005  . thumb surgery Right   . TOTAL KNEE ARTHROPLASTY Right 03/12/2016   Procedure: RIGHT TOTAL KNEE ARTHROPLASTY;  Surgeon: Paralee Cancel, MD;  Location: [facility];  Service: Orthopedics;  Laterality: Right;  . UPPER GASTROINTESTINAL ENDOSCOPY    . VENTRAL HERNIA REPAIR N/A 03/20/2018   Procedure: LAPAROSCOPIC VENTRAL INCISIONAL  HERNIA ERAS PATHWAY;  Surgeon: Alphonsa Overall, MD;  Location: [place];  Service: General;  Laterality: N/A;    reports that he quit smoking about 28 years ago. He has never used smokeless tobacco. He reports current alcohol use. He reports that he does not use drugs. family history includes Arthritis in his brother and daughter; COPD in his brother; Cancer in his brother; Coronary artery disease in his father; Diabetes in his father; Heart disease in his father and mother; Heart failure in his brother; Hypertension in an other family member; Stomach cancer in his paternal grandmother; Sudden death in his father. Allergies  Allergen Reactions  . Sulfonamide Derivatives Rash        Current Outpatient Medications on File Prior to Visit  Medication Sig Dispense Refill  . apixaban (ELIQUIS) 5 MG TABS tablet Take 1 tablet (5 mg total) by mouth 2 (two) times daily. 60 tablet 0  . atorvastatin (LIPITOR) 20 MG tablet Take 1 tablet (20 mg total) by mouth daily. 90 tablet 2  . b complex vitamins tablet Take 1 tablet by mouth daily with lunch.     . calcium carbonate (OSCAL) 1500 (600 Ca) MG TABS tablet Take 600 mg by mouth every evening.    . carvedilol (COREG) 25 MG tablet Take 1 tablet (25 mg total) by mouth 2 (two) times daily. 180 tablet 2  . CINNAMON PO Take 1 tablet by mouth daily with lunch.     . Coenzyme Q10 (CO Q 10) 100 MG CAPS  Take 1 capsule by mouth daily with lunch.     . diclofenac sodium (VOLTAREN) 1 % GEL Apply 1 application topically 4 (four) times daily as needed (pain).   3  . ENTRESTO 49-51 MG TAKE 1 TABLET BY MOUTH 2 TIMES DAILY 967 tablet 3  . folic acid (FOLVITE) 893 MCG tablet Take 400 mcg by mouth daily.     . Glucosamine HCl (GLUCOSAMINE PO) Take 1,500 mg by mouth every evening.    Marland Kitchen HYDROcodone-acetaminophen (NORCO/VICODIN) 5-325 MG tablet Take 1-2 tablets by mouth every 4 (four) hours as needed for moderate pain. 30 tablet 0  . hydroxychloroquine (PLAQUENIL) 200 MG tablet Take one tablet (200 mg) twice daily Monday through Friday only. Do not  take Saturday or Sundays. 120 tablet 0  . hydroxypropyl methylcellulose / hypromellose (ISOPTO TEARS / GONIOVISC) 2.5 % ophthalmic solution Place 1 drop into both eyes 4 (four) times daily as needed for dry eyes.     Marland Kitchen MAGNESIUM PO Take 1 tablet by mouth daily as needed (for cramping).     . Multiple Vitamin (MULTIVITAMIN) capsule Take 1 capsule by mouth daily.      . Multiple Vitamins-Minerals (OCUVITE PO) Take 1 tablet by mouth at bedtime.    . nabumetone (RELAFEN) 500 MG tablet Take 1 tablet (500 mg total) by mouth 2 (two) times daily. 180 tablet 0  . NON FORMULARY CPAP machine with sleep.    . Omega-3 Fatty Acids (FISH OIL) 500 MG CAPS Take 500 mg by mouth at bedtime.     . ondansetron (ZOFRAN-ODT) 4 MG disintegrating tablet Take 1 tablet (4 mg total) by mouth every 6 (six) hours as needed for nausea. 20 tablet 0  . pantoprazole (PROTONIX) 40 MG tablet TAKE 1 TABLET BY MOUTH EVERY DAY BEFORE BREAKFAST GENERIC EQUIVALENT FOR PROTONIX 90 tablet 3  . Probiotic Product (PROBIOTIC DAILY PO) Take 1 capsule by mouth daily with lunch.     . Saw Palmetto, Serenoa repens, (SAW PALMETTO PO) Take 1 capsule by mouth every evening.    Marland Kitchen spironolactone (ALDACTONE) 25 MG tablet TAKE 1/2 TABLET(12.5 MG) BY MOUTH DAILY (Patient taking differently: Take 12.5 mg by mouth daily. )  15 tablet 9  . tamsulosin (FLOMAX) 0.4 MG CAPS capsule Take 0.4 mg by mouth at bedtime.    . Testosterone 20 % CREA Apply 2 mLs topically daily. Rub on shoulder    . traMADol (ULTRAM) 50 MG tablet Take 50 mg by mouth daily with lunch.     . TURMERIC PO Take 1 tablet by mouth daily with lunch.      No current facility-administered medications on file prior to visit.    Review of Systems  Constitutional: Negative for other unusual diaphoresis or sweats HENT: Negative for ear discharge or swelling Eyes: Negative for other worsening visual disturbances Respiratory: Negative for stridor or other swelling  Gastrointestinal: Negative for worsening distension or other blood Genitourinary: Negative for retention or other urinary change Musculoskeletal: Negative for other MSK pain or swelling Skin: Negative for color change or other new lesions Neurological: Negative for worsening tremors and other numbness  Psychiatric/Behavioral: Negative for worsening agitation or other fatigue All other system neg per pt    Objective:   Physical Exam BP 138/86   Pulse 87   Temp 98.9 F (37.2 C) (Oral)   Ht 5' 7.5" (1.715 m)   Wt 194 lb (88 kg)   SpO2 93%   BMI 29.94 kg/m  VS noted,  Constitutional: Pt appears in NAD HENT: Head: NCAT.  Right Ear: External ear normal.  Left Ear: External ear normal.  Eyes: . Pupils are equal, round, and reactive to light. Conjunctivae and EOM are normal Nose: without d/c or deformity Neck: Neck supple. Gross normal ROM Cardiovascular: Normal rate and regular rhythm.   Pulmonary/Chest: Effort normal and breath sounds without rales or wheezing.  Abd:  Soft, NT, ND, + BS, no organomegaly Neurological: Pt is alert. At baseline orientation, motor grossly intact Skin: Skin is warm. No rashes, other new lesions, no LE edema Psychiatric: Pt behavior is normal without agitation  No other exam findings  Lab Results  Component Value Date   WBC 5.5 03/12/2018   HGB  15.1 03/12/2018  HCT 47.8 03/12/2018   PLT 244 03/12/2018   GLUCOSE 104 (H) 03/12/2018   CHOL 138 11/27/2016   TRIG 188 (H) 11/27/2016   HDL 35 (L) 11/27/2016   LDLCALC 65 11/27/2016   ALT 19 12/23/2017   AST 22 12/23/2017   NA 138 03/12/2018   K 4.5 03/12/2018   CL 109 03/12/2018   CREATININE 0.94 03/12/2018   BUN 19 03/12/2018   CO2 22 03/12/2018   TSH 3.52 12/31/2017   PSA 3.29 12/31/2017   INR 1.02 01/15/2017   HGBA1C 6.0 12/31/2017       Assessment & Plan:

## 2018-07-07 ENCOUNTER — Encounter: Payer: Self-pay | Admitting: Rheumatology

## 2018-07-07 ENCOUNTER — Ambulatory Visit (INDEPENDENT_AMBULATORY_CARE_PROVIDER_SITE_OTHER): Payer: Medicare Other | Admitting: Rheumatology

## 2018-07-07 VITALS — BP 129/71 | HR 76 | Resp 13 | Ht 68.0 in | Wt 193.0 lb

## 2018-07-07 DIAGNOSIS — Z5181 Encounter for therapeutic drug level monitoring: Secondary | ICD-10-CM | POA: Diagnosis not present

## 2018-07-07 DIAGNOSIS — Z8679 Personal history of other diseases of the circulatory system: Secondary | ICD-10-CM

## 2018-07-07 DIAGNOSIS — Z8669 Personal history of other diseases of the nervous system and sense organs: Secondary | ICD-10-CM | POA: Diagnosis not present

## 2018-07-07 DIAGNOSIS — Z79899 Other long term (current) drug therapy: Secondary | ICD-10-CM | POA: Diagnosis not present

## 2018-07-07 DIAGNOSIS — M5136 Other intervertebral disc degeneration, lumbar region: Secondary | ICD-10-CM | POA: Diagnosis not present

## 2018-07-07 DIAGNOSIS — M1380 Other specified arthritis, unspecified site: Secondary | ICD-10-CM

## 2018-07-07 DIAGNOSIS — Z96651 Presence of right artificial knee joint: Secondary | ICD-10-CM

## 2018-07-07 DIAGNOSIS — M19042 Primary osteoarthritis, left hand: Secondary | ICD-10-CM

## 2018-07-07 DIAGNOSIS — Z87438 Personal history of other diseases of male genital organs: Secondary | ICD-10-CM | POA: Diagnosis not present

## 2018-07-07 DIAGNOSIS — M1712 Unilateral primary osteoarthritis, left knee: Secondary | ICD-10-CM | POA: Diagnosis not present

## 2018-07-07 DIAGNOSIS — Z8639 Personal history of other endocrine, nutritional and metabolic disease: Secondary | ICD-10-CM

## 2018-07-07 DIAGNOSIS — Z860100 Personal history of colon polyps, unspecified: Secondary | ICD-10-CM

## 2018-07-07 DIAGNOSIS — M19072 Primary osteoarthritis, left ankle and foot: Secondary | ICD-10-CM

## 2018-07-07 DIAGNOSIS — M1990 Unspecified osteoarthritis, unspecified site: Secondary | ICD-10-CM

## 2018-07-07 DIAGNOSIS — Z8719 Personal history of other diseases of the digestive system: Secondary | ICD-10-CM

## 2018-07-07 DIAGNOSIS — M19041 Primary osteoarthritis, right hand: Secondary | ICD-10-CM | POA: Diagnosis not present

## 2018-07-07 DIAGNOSIS — M19071 Primary osteoarthritis, right ankle and foot: Secondary | ICD-10-CM | POA: Diagnosis not present

## 2018-07-07 DIAGNOSIS — M51369 Other intervertebral disc degeneration, lumbar region without mention of lumbar back pain or lower extremity pain: Secondary | ICD-10-CM

## 2018-07-07 LAB — COMPLETE METABOLIC PANEL WITH GFR
AG Ratio: 1.9 (calc) (ref 1.0–2.5)
ALBUMIN MSPROF: 4.3 g/dL (ref 3.6–5.1)
ALKALINE PHOSPHATASE (APISO): 70 U/L (ref 35–144)
ALT: 16 U/L (ref 9–46)
AST: 19 U/L (ref 10–35)
BUN: 18 mg/dL (ref 7–25)
CALCIUM: 9.7 mg/dL (ref 8.6–10.3)
CO2: 27 mmol/L (ref 20–32)
CREATININE: 0.99 mg/dL (ref 0.70–1.18)
Chloride: 104 mmol/L (ref 98–110)
GFR, EST NON AFRICAN AMERICAN: 75 mL/min/{1.73_m2} (ref >=60)
GFR, Est African American: 87 mL/min/{1.73_m2} (ref >=60)
Globulin: 2.3 g/dL (calc) (ref 1.9–3.7)
Glucose, Bld: 86 mg/dL (ref 65–99)
Potassium: 4.2 mmol/L (ref 3.5–5.3)
Sodium: 138 mmol/L (ref 135–146)
Total Bilirubin: 1.1 mg/dL (ref 0.2–1.2)
Total Protein: 6.6 g/dL (ref 6.1–8.1)

## 2018-07-07 LAB — CBC WITH DIFFERENTIAL/PLATELET
Absolute Monocytes: 701 cells/uL (ref 200–950)
BASOS PCT: 0.8 %
Basophils Absolute: 50 cells/uL (ref 0–200)
Eosinophils Absolute: 223 cells/uL (ref 15–500)
Eosinophils Relative: 3.6 %
HEMATOCRIT: 42.5 % (ref 38.5–50.0)
HEMOGLOBIN: 14.6 g/dL (ref 13.2–17.1)
LYMPHS ABS: 1823 {cells}/uL (ref 850–3900)
MCH: 30.6 pg (ref 27.0–33.0)
MCHC: 34.4 g/dL (ref 32.0–36.0)
MCV: 89.1 fL (ref 80.0–100.0)
MPV: 10.8 fL (ref 7.5–12.5)
Monocytes Relative: 11.3 %
Neutro Abs: 3404 cells/uL (ref 1500–7800)
Neutrophils Relative %: 54.9 %
Platelets: 255 10*3/uL (ref 140–400)
RBC: 4.77 10*6/uL (ref 4.20–5.80)
RDW: 12.5 % (ref 11.0–15.0)
Total Lymphocyte: 29.4 %
WBC: 6.2 10*3/uL (ref 3.8–10.8)

## 2018-07-08 ENCOUNTER — Ambulatory Visit (INDEPENDENT_AMBULATORY_CARE_PROVIDER_SITE_OTHER): Payer: Medicare Other | Admitting: Cardiovascular Disease

## 2018-07-08 ENCOUNTER — Ambulatory Visit (INDEPENDENT_AMBULATORY_CARE_PROVIDER_SITE_OTHER): Payer: Medicare Other

## 2018-07-08 ENCOUNTER — Encounter: Payer: Self-pay | Admitting: Cardiovascular Disease

## 2018-07-08 VITALS — BP 142/84 | HR 75 | Ht 68.0 in | Wt 194.6 lb

## 2018-07-08 DIAGNOSIS — I428 Other cardiomyopathies: Secondary | ICD-10-CM | POA: Diagnosis not present

## 2018-07-08 DIAGNOSIS — I5022 Chronic systolic (congestive) heart failure: Secondary | ICD-10-CM

## 2018-07-08 DIAGNOSIS — I480 Paroxysmal atrial fibrillation: Secondary | ICD-10-CM

## 2018-07-08 DIAGNOSIS — I2510 Atherosclerotic heart disease of native coronary artery without angina pectoris: Secondary | ICD-10-CM | POA: Diagnosis not present

## 2018-07-08 NOTE — Patient Instructions (Signed)
Medication Instructions:  Your provider recommends that you continue on your current medications as directed. Please refer to the Current Medication list given to you today.    Labwork: None  Testing/Procedures: Your provider has requested that you have an echocardiogram. Echocardiography is a painless test that uses sound waves to create images of your heart. It provides your doctor with information about the size and shape of your heart and how well your heart's chambers and valves are working. This procedure takes approximately one hour. There are no restrictions for this procedure.  Follow-Up: Your provider wants you to follow-up in: 6 months with Dr. Burt Knack. You will receive a reminder letter in the mail two months in advance. If you don't receive a letter, please call our office to schedule the follow-up appointment.

## 2018-07-08 NOTE — Progress Notes (Signed)
Cardiology Office Note:    Date:  07/08/2018   ID:  [name], DOB 10/11/1945, MRN [id number]  PCP:  Biagio Borg, MD  Cardiologist:  [name], MD  Electrophysiologist:  Constance Haw, MD   Referring MD: [name], MD   Chief Complaint  Patient presents with  . Shortness of Breath   History of Present Illness:    [name] is a 73 y.o. male with a hx of chronic systolic heart failure with left bundle branch block, coronary artery disease with remote stenting of the RCA, and paroxysmal atrial fibrillation.  He presents for follow-up evaluation today.  He underwent CRT-D implantation in 2018.  He was ultimately found to have atrial fibrillation on remote device monitoring and was started on anticoagulation with apixaban.  His antiplatelet therapy was stopped at that time.  LV function normalized after cardiac resynchronization.  The patient is here alone today.  He complains of generalized fatigue and diffuse joint pains.  He has had to stop his nonsteroidal anti-inflammatory medication because of chronic oral anticoagulation.  He denies chest pain, chest pressure, leg swelling, orthopnea, or heart palpitations.  He does have mild exertional dyspnea with no change over time.  His primary complaint as generalized fatigue.  States that he is not feeling as well as he was early on after CRT.  Past Medical History:  Diagnosis Date  . AICD (automatic cardioverter/defibrillator) present   . Allergic rhinitis   . Allergy   . Anginal pain ([place]) 05/26/14   chest pain after chasing dog  . CAD (coronary artery disease)    hx of stent- 2005 RCA  . Cancer ([place])    skin cancer  . Cataract    beginning stage both eyes  . CHF (congestive heart failure) (HCC)    pacemaker [employer]    . Chronic back pain    intermittent  . Constipation   . Dizziness   . Dysrhythmia    right bundle branch block   . Esophageal stricture   . GERD (gastroesophageal reflux disease)   .  Hemoptysis   . Hiatal hernia   . History of colonic polyps    hyperplastic  . HTN (hypertension)   . Hyperlipidemia   . Hypertrophy of prostate with urinary obstruction and other lower urinary tract symptoms (LUTS)   . OA (osteoarthritis)   . OSA (obstructive sleep apnea)    cpap- 10   . 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 Pneumonia   . Pre-diabetes    no medications  . Shortness of breath    with exertion  . Sleep apnea    cpap  . Testicular hypofunction     Past Surgical History:  Procedure Laterality Date  . BIV ICD INSERTION CRT-D N/A 03/27/2017   Procedure: BIV ICD INSERTION CRT-D;  Surgeon: Constance Haw, MD;  Location: [facility];  Service: Cardiovascular;  Laterality: N/A;  . CARDIAC CATHETERIZATION     '05, last 2009, showing patent RCA stent  . COLONOSCOPY  12/2007   HYPERPLASTIC POLYP  . COLONOSCOPY WITH PROPOFOL N/A 06/02/2014   Procedure: COLONOSCOPY WITH PROPOFOL;  Surgeon: [name], MD;  Location: [facility];  Service: Endoscopy;  Laterality: N/A;  . CORONARY ANGIOPLASTY  08/2003  . ESOPHAGOGASTRODUODENOSCOPY (EGD) WITH PROPOFOL N/A 06/02/2014   Procedure: ESOPHAGOGASTRODUODENOSCOPY (EGD) WITH PROPOFOL;  Surgeon: [name], MD;  Location:  [facility];  Service: Endoscopy;  Laterality: N/A;  . hernia surgery x 3     Bilateral Inguinal, Umbicial  . INSERTION OF MESH N/A 03/20/2018   Procedure: INSERTION OF MESH;  Surgeon: Alphonsa Overall, MD;  Location: [facility];  Service: General;  Laterality: N/A;  . IRRIGATION AND DEBRIDEMENT ABSCESS Left 08/14/2012   Procedure: IRRIGATION AND DEBRIDEMENT LEFT INGUINAL BOIL ;  Surgeon: Ailene Rud, MD;  Location: [facility];  Service: Urology;  Laterality: Left;  . JOINT REPLACEMENT Right 2017  . KNEE ARTHROPLASTY    . LEFT HEART CATHETERIZATION WITH CORONARY ANGIOGRAM N/A 05/26/2014   Procedure: LEFT HEART  CATHETERIZATION WITH CORONARY ANGIOGRAM;  Surgeon: Blane Ohara, MD;  Location: [facility] CATH LAB;  Service: Cardiovascular;  Laterality: N/A;  . LUMBAR LAMINECTOMY/DECOMPRESSION MICRODISCECTOMY Left 09/26/2014   Procedure: Lumbar Laminectomy for resection of synovial cyst  Lumbar five- sacral one left;  Surgeon: Kary Kos, MD;  Location: [facility] OR[facility];  Service: Neurosurgery;  Laterality: Left;  . MOLE REMOVAL Left 03/20/2018   Procedure: MOLE REMOVAL;  Surgeon: Alphonsa Overall, MD;  Location: [facility];  Service: General;  Laterality: Left;  . Oral surg to removed growth from ?sinus  10/2010   ?Dermoid removed by DrRiggs  . PACEMAKER INSERTION  04/03/2017  . POLYPECTOMY    . RCA stenting     '05 RCA  . right toe surgery  Right    Cyst   . RIGHT/LEFT HEART CATH AND CORONARY ANGIOGRAPHY N/A 01/15/2017   Procedure: RIGHT/LEFT HEART CATH AND CORONARY ANGIOGRAPHY;  Surgeon: [name], MD;  Location: [facility];  Service: Cardiovascular;  Laterality: N/A;  . s/p right knee arthroscopy  2005  . thumb surgery Right   . TOTAL KNEE ARTHROPLASTY Right 03/12/2016   Procedure: RIGHT TOTAL KNEE ARTHROPLASTY;  Surgeon: Paralee Cancel, MD;  Location: [facility];  Service: Orthopedics;  Laterality: Right;  . UPPER GASTROINTESTINAL ENDOSCOPY    . VENTRAL HERNIA REPAIR N/A 03/20/2018   Procedure: LAPAROSCOPIC VENTRAL INCISIONAL  HERNIA ERAS PATHWAY;  Surgeon: Alphonsa Overall, MD;  Location: [place];  Service: General;  Laterality: N/A;    Current Medications: Current Meds  Medication Sig  . apixaban (ELIQUIS) 5 MG TABS tablet Take 1 tablet (5 mg total) by mouth 2 (two) times daily.  Marland Kitchen atorvastatin (LIPITOR) 20 MG tablet Take 1 tablet (20 mg total) by mouth daily.  Marland Kitchen b complex vitamins tablet Take 1 tablet by mouth daily with lunch.   . calcium carbonate (OSCAL) 1500 (600 Ca) MG TABS tablet Take 600 mg by mouth every evening.  . carvedilol (COREG) 25 MG tablet Take 1 tablet (25 mg total) by mouth 2 (two) times  daily.  Marland Kitchen CINNAMON PO Take 1 tablet by mouth daily with lunch.   . Coenzyme Q10 (CO Q 10) 100 MG CAPS Take 1 capsule by mouth daily with lunch.   . diclofenac sodium (VOLTAREN) 1 % GEL Apply 1 application topically 4 (four) times daily as needed (pain).   Marland Kitchen ENTRESTO 49-51 MG TAKE 1 TABLET BY MOUTH 2 TIMES DAILY  . folic acid (FOLVITE) 284 MCG tablet Take 400 mcg by mouth daily.   . Glucosamine HCl (GLUCOSAMINE PO) Take 1,500 mg by mouth every evening.  . hydroxychloroquine (PLAQUENIL) 200 MG tablet Take one tablet (200 mg) twice daily Monday through Friday only. Do not take Saturday or Sundays.  . hydroxypropyl methylcellulose / hypromellose (ISOPTO TEARS / GONIOVISC) 2.5 % ophthalmic solution Place 1 drop into both  eyes 4 (four) times daily as needed for dry eyes.   . Multiple Vitamin (MULTIVITAMIN) capsule Take 1 capsule by mouth daily.    . Multiple Vitamins-Minerals (OCUVITE PO) Take 1 tablet by mouth at bedtime.  . NON FORMULARY CPAP machine with sleep.  . pantoprazole (PROTONIX) 40 MG tablet TAKE 1 TABLET BY MOUTH EVERY DAY BEFORE BREAKFAST GENERIC EQUIVALENT FOR PROTONIX  . Probiotic Product (PROBIOTIC DAILY PO) Take 1 capsule by mouth daily with lunch.   . Saw Palmetto, Serenoa repens, (SAW PALMETTO PO) Take 1 capsule by mouth every evening.  Marland Kitchen spironolactone (ALDACTONE) 25 MG tablet TAKE 1/2 TABLET(12.5 MG) BY MOUTH DAILY  . tamsulosin (FLOMAX) 0.4 MG CAPS capsule Take 0.4 mg by mouth at bedtime.  . Testosterone 20 % CREA Apply 2 mLs topically daily. Rub on shoulder  . TURMERIC PO Take 1 tablet by mouth daily with lunch.   . [DISCONTINUED] HYDROcodone-acetaminophen (NORCO/VICODIN) 5-325 MG tablet Take 1-2 tablets by mouth every 4 (four) hours as needed for moderate pain.  . [DISCONTINUED] ondansetron (ZOFRAN-ODT) 4 MG disintegrating tablet Take 1 tablet (4 mg total) by mouth every 6 (six) hours as needed for nausea.     Allergies:   Sulfonamide derivatives   Social History    Socioeconomic History  . Marital status: Married    Spouse name: Not on file  . Number of children: 1  . Years of education: Not on file  . Highest education level: Not on file  Occupational History  . Occupation: Lobbyist: [employer]  . Financial resource strain: Not on file  . Food insecurity:    Worry: Not on file    Inability: Not on file  . Transportation needs:    Medical: Not on file    Non-medical: Not on file  Tobacco Use  . Smoking status: Former Smoker    Last attempt to quit: 05/20/1990    Years since quitting: 28.1  . Smokeless tobacco: Never Used  . Tobacco comment: previous 30 pack year history  Substance and Sexual Activity  . Alcohol use: Yes    Comment: rare  . Drug use: Never  . Sexual activity: Not on file  Lifestyle  . Physical activity:    Days per week: Not on file    Minutes per session: Not on file  . Stress: Not on file  Relationships  . Social connections:    Talks on phone: Not on file    Gets together: Not on file    Attends religious service: Not on file    Active member of club or organization: Not on file    Attends meetings of clubs or organizations: Not on file    Relationship status: Not on file  Other Topics Concern  . Not on file  Social History Narrative  . Not on file     Family History: The patient's family history includes Arthritis in his brother and daughter; COPD in his brother; Cancer in his brother; Coronary artery disease in his father; Diabetes in his father; Heart disease in his father and mother; Heart failure in his brother; Hypertension in an other family member; Stomach cancer in his paternal grandmother; Sudden death in his father. There is no history of Colon cancer, Esophageal cancer, Colon polyps, or Ulcerative colitis.  ROS:   Please see the history of present illness.    Positive for fatigue. All other systems reviewed and are negative.  EKGs/Labs/Other Studies  Reviewed:    The following studies were reviewed today: Echo 06-25-2017: Left ventricle:  The cavity size was normal. Systolic function was normal. The estimated ejection fraction was in the range of 55% to 60%. Wall motion was normal; there were no regional wall motion abnormalities. Doppler parameters are consistent with abnormal left ventricular relaxation (grade 1 diastolic dysfunction).  ------------------------------------------------------------------- Aortic valve:   Structurally normal valve.   Cusp separation was normal.  Doppler:  Transvalvular velocity was within the normal range. There was no stenosis. There was trivial regurgitation.  ------------------------------------------------------------------- Aorta:  Aortic root: The aortic root was normal in size. Ascending aorta: The ascending aorta was normal in size.  ------------------------------------------------------------------- Mitral valve:   Structurally normal valve.   Leaflet separation was normal.  Doppler:  Transvalvular velocity was within the normal range. There was no evidence for stenosis. There was trivial regurgitation.  ------------------------------------------------------------------- Left atrium:  The atrium was normal in size.  ------------------------------------------------------------------- Atrial septum:  There was increased thickness of the septum, consistent with lipomatous hypertrophy.  ------------------------------------------------------------------- Right ventricle:  The cavity size was normal. Wall thickness was normal. Pacer wire or catheter noted in right ventricle. Systolic function was normal.  ------------------------------------------------------------------- Ventricular septum:   Septal motion showed dyssynergy. These changes are consistent with a left bundle branch block.  ------------------------------------------------------------------- Pulmonic valve:    The  valve appears to be grossly normal. Doppler:  There was trivial regurgitation.  ------------------------------------------------------------------- Tricuspid valve:   Structurally normal valve.   Leaflet separation was normal.  Doppler:  Transvalvular velocity was within the normal range. There was trivial regurgitation.  ------------------------------------------------------------------- Right atrium:  The atrium was normal in size.  ------------------------------------------------------------------- Pericardium:  There was no pericardial effusion.  ------------------------------------------------------------------- Systemic veins: Inferior vena cava: The vessel was normal in size. The respirophasic diameter changes were in the normal range (>= 50%), consistent with normal central venous pressure.  EKG:  EKG is not ordered today.    Recent Labs: 12/31/2017: Pro B Natriuretic peptide (BNP) 26.0; TSH 3.52 07/07/2018: ALT 16; BUN 18; Creat 0.99; Hemoglobin 14.6; Platelets 255; Potassium 4.2; Sodium 138  Recent Lipid Panel    Component Value Date/Time   CHOL 138 11/27/2016 0914   TRIG 188 (H) 11/27/2016 0914   HDL 35 (L) 11/27/2016 0914   CHOLHDL 3.9 11/27/2016 0914   CHOLHDL 4 12/07/2015 0742   VLDL 30.6 12/07/2015 0742   LDLCALC 65 11/27/2016 0914    Physical Exam:    VS:  BP (!) 142/84   Pulse 75   Ht 5\' 8"  (1.727 m)   Wt 194 lb 9.6 oz (88.3 kg)   SpO2 97%   BMI 29.59 kg/m     Wt Readings from Last 3 Encounters:  07/08/18 194 lb 9.6 oz (88.3 kg)  07/07/18 193 lb (87.5 kg)  06/25/18 194 lb (88 kg)     GEN: Well nourished, well developed in no acute distress HEENT: Normal NECK: No JVD; No carotid bruits LYMPHATICS: No lymphadenopathy CARDIAC: RRR, no murmurs, rubs, gallops RESPIRATORY:  Clear to auscultation without rales, wheezing or rhonchi  ABDOMEN: Soft, non-tender, non-distended MUSCULOSKELETAL:  No edema; No deformity  SKIN: Warm and  dry NEUROLOGIC:  Alert and oriented x 3 PSYCHIATRIC:  Normal affect   ASSESSMENT:    1. PAF (paroxysmal atrial fibrillation) ([place])   2. Chronic systolic (congestive) heart failure ([place])   3. Coronary artery disease involving native coronary artery of native heart without angina pectoris    PLAN:    In order  of problems listed above:  1. Asymptomatic, noted on device monitoring.  The patient is anticoagulated with apixaban.  He is treated with carvedilol.  Will update an echocardiogram. 2. Overall appears stable but notes some increase in generalized fatigue.  We will update his echocardiogram to make sure that his LV function remains improved. 3. No anginal symptoms.  Continue atorvastatin for secondary risk reduction.  No antiplatelet drug is used because of oral anticoagulation.  Medication Adjustments/Labs and Tests Ordered: Current medicines are reviewed at length with the patient today.  Concerns regarding medicines are outlined above.  Orders Placed This Encounter  Procedures  . ECHOCARDIOGRAM COMPLETE   No orders of the defined types were placed in this encounter.   Patient Instructions  Medication Instructions:  Your provider recommends that you continue on your current medications as directed. Please refer to the Current Medication list given to you today.    Labwork: None  Testing/Procedures: Your provider has requested that you have an echocardiogram. Echocardiography is a painless test that uses sound waves to create images of your heart. It provides your doctor with information about the size and shape of your heart and how well your heart's chambers and valves are working. This procedure takes approximately one hour. There are no restrictions for this procedure.  Follow-Up: Your provider wants you to follow-up in: 6 months with Dr. Burt Knack. You will receive a reminder letter in the mail two months in advance. If you don't receive a letter, please call our office to  schedule the follow-up appointment.        Signed, [name], MD  07/08/2018 5:20 PM    [facility]

## 2018-07-08 NOTE — Progress Notes (Signed)
Labs are WNL.

## 2018-07-09 ENCOUNTER — Telehealth: Payer: Self-pay

## 2018-07-09 LAB — PAIN MGMT, PROFILE 5 W/CONF, U
Amphetamines: NEGATIVE ng/mL (ref <500)
BENZODIAZEPINES: NEGATIVE ng/mL (ref <100)
Barbiturates: NEGATIVE ng/mL (ref <300)
CREATININE: 74.7 mg/dL
Cocaine Metabolite: NEGATIVE ng/mL (ref <150)
Marijuana Metabolite: NEGATIVE ng/mL (ref <20)
Methadone Metabolite: NEGATIVE ng/mL (ref <100)
OPIATES: NEGATIVE ng/mL (ref <100)
OXYCODONE: NEGATIVE ng/mL (ref <100)
Oxidant: NEGATIVE ug/mL (ref <200)
pH: 6.73 (ref 4.5–9.0)

## 2018-07-09 LAB — PAIN MGMT, TRAMADOL W/MEDMATCH, U
DESMETHYLTRAMADOL: 3221 ng/mL — AB (ref <100)
Tramadol: 4648 ng/mL — ABNORMAL HIGH (ref <100)

## 2018-07-09 MED ORDER — TRAMADOL HCL 50 MG PO TABS: 50.0000 mg | ORAL_TABLET | Freq: Every evening | ORAL | 0 refills | Status: DC | PRN

## 2018-07-09 NOTE — Telephone Encounter (Signed)
Prescription has been faxed to the pharmacy, patient has been notified.

## 2018-07-09 NOTE — Telephone Encounter (Signed)
Mental 1 tablet p.o. nightly as needed total 30 with no refills.

## 2018-07-09 NOTE — Telephone Encounter (Signed)
Last Visit: 07/07/2018 Next Visit: 12/22/2018 UDS: 07/07/2018 UDS is consistent with tramadol.  Narc Agreement: 07/07/2018 Risk tool: 07/07/2018  Okay to fill tramadol? How would you like to prescription to be written?

## 2018-07-09 NOTE — Progress Notes (Signed)
UDS is consistent with tramadol

## 2018-07-10 LAB — CUP PACEART REMOTE DEVICE CHECK
Battery Voltage: 3 V
Brady Statistic AP VS Percent: 0.05 %
Brady Statistic AS VP Percent: 89.52 %
Brady Statistic AS VS Percent: 6.83 %
HighPow Impedance: 67 Ohm
Implantable Lead Implant Date: 20181108
Implantable Lead Implant Date: 20181108
Implantable Lead Implant Date: 20181108
Implantable Lead Location: 753858
Implantable Pulse Generator Implant Date: 20181108
Lead Channel Impedance Value: 195.429
Lead Channel Impedance Value: 342 Ohm
Lead Channel Impedance Value: 399 Ohm
Lead Channel Impedance Value: 532 Ohm
Lead Channel Impedance Value: 589 Ohm
Lead Channel Impedance Value: 646 Ohm
Lead Channel Impedance Value: 836 Ohm
Lead Channel Pacing Threshold Amplitude: 2 V
Lead Channel Pacing Threshold Pulse Width: 0.4 ms
Lead Channel Pacing Threshold Pulse Width: 1 ms
Lead Channel Sensing Intrinsic Amplitude: 13.375 mV
Lead Channel Sensing Intrinsic Amplitude: 13.375 mV
Lead Channel Sensing Intrinsic Amplitude: 2.5 mV
Lead Channel Setting Pacing Amplitude: 2.5 V
Lead Channel Setting Pacing Pulse Width: 0.4 ms
Lead Channel Setting Pacing Pulse Width: 1 ms
MDC IDC LEAD LOCATION: 753859
MDC IDC LEAD LOCATION: 753860
MDC IDC MSMT BATTERY REMAINING LONGEVITY: 88 mo
MDC IDC MSMT LEADCHNL LV IMPEDANCE VALUE: 1083 Ohm
MDC IDC MSMT LEADCHNL LV IMPEDANCE VALUE: 1140 Ohm
MDC IDC MSMT LEADCHNL LV IMPEDANCE VALUE: 1197 Ohm
MDC IDC MSMT LEADCHNL LV IMPEDANCE VALUE: 188.1 Ohm
MDC IDC MSMT LEADCHNL LV IMPEDANCE VALUE: 242.71 Ohm
MDC IDC MSMT LEADCHNL LV IMPEDANCE VALUE: 278.667
MDC IDC MSMT LEADCHNL LV IMPEDANCE VALUE: 295.059
MDC IDC MSMT LEADCHNL LV IMPEDANCE VALUE: 418 Ohm
MDC IDC MSMT LEADCHNL LV IMPEDANCE VALUE: 456 Ohm
MDC IDC MSMT LEADCHNL LV IMPEDANCE VALUE: 665 Ohm
MDC IDC MSMT LEADCHNL RA PACING THRESHOLD AMPLITUDE: 0.625 V
MDC IDC MSMT LEADCHNL RA PACING THRESHOLD PULSEWIDTH: 0.4 ms
MDC IDC MSMT LEADCHNL RA SENSING INTR AMPL: 2.5 mV
MDC IDC MSMT LEADCHNL RV IMPEDANCE VALUE: 532 Ohm
MDC IDC MSMT LEADCHNL RV PACING THRESHOLD AMPLITUDE: 0.5 V
MDC IDC SESS DTM: 20200219083324
MDC IDC SET LEADCHNL RA PACING AMPLITUDE: 2 V
MDC IDC SET LEADCHNL RV PACING AMPLITUDE: 2.5 V
MDC IDC SET LEADCHNL RV SENSING SENSITIVITY: 0.3 mV
MDC IDC STAT BRADY AP VP PERCENT: 3.61 %
MDC IDC STAT BRADY RA PERCENT PACED: 3.6 %
MDC IDC STAT BRADY RV PERCENT PACED: 31.16 %

## 2018-07-13 ENCOUNTER — Other Ambulatory Visit: Payer: Self-pay

## 2018-07-13 MED ORDER — APIXABAN 5 MG PO TABS: 5.0000 mg | ORAL_TABLET | Freq: Two times a day (BID) | ORAL | 3 refills | Status: DC

## 2018-07-13 NOTE — Telephone Encounter (Signed)
Written refill request received from [facility].  Pt last saw Dr Burt Knack 07/08/18, last labs 07/07/18 Creat 0.99, age 73, weight 88.3kg, based on specifed criteria pt is on appropriate dosage of Eliquis 5mg  BID.  Will refill rx.

## 2018-07-14 ENCOUNTER — Encounter: Payer: Medicare Other | Admitting: Cardiology

## 2018-07-14 DIAGNOSIS — L821 Other seborrheic keratosis: Secondary | ICD-10-CM | POA: Diagnosis not present

## 2018-07-14 DIAGNOSIS — L820 Inflamed seborrheic keratosis: Secondary | ICD-10-CM | POA: Diagnosis not present

## 2018-07-14 DIAGNOSIS — L812 Freckles: Secondary | ICD-10-CM | POA: Diagnosis not present

## 2018-07-14 DIAGNOSIS — L723 Sebaceous cyst: Secondary | ICD-10-CM | POA: Diagnosis not present

## 2018-07-14 DIAGNOSIS — D225 Melanocytic nevi of trunk: Secondary | ICD-10-CM | POA: Diagnosis not present

## 2018-07-14 DIAGNOSIS — D1801 Hemangioma of skin and subcutaneous tissue: Secondary | ICD-10-CM | POA: Diagnosis not present

## 2018-07-15 ENCOUNTER — Ambulatory Visit (HOSPITAL_COMMUNITY): Payer: Medicare Other | Attending: Internal Medicine

## 2018-07-15 DIAGNOSIS — I480 Paroxysmal atrial fibrillation: Secondary | ICD-10-CM | POA: Diagnosis not present

## 2018-07-16 NOTE — Progress Notes (Signed)
Remote ICD transmission.   

## 2018-07-17 ENCOUNTER — Encounter: Payer: Self-pay | Admitting: Cardiology

## 2018-07-19 ENCOUNTER — Other Ambulatory Visit: Payer: Self-pay | Admitting: Cardiology

## 2018-08-12 ENCOUNTER — Other Ambulatory Visit: Payer: Self-pay | Admitting: Rheumatology

## 2018-08-12 NOTE — Telephone Encounter (Signed)
ok 

## 2018-08-12 NOTE — Telephone Encounter (Signed)
Last Visit:07/07/2018 Next Visit:12/22/2018 UDS:2/18/2020UDS is consistent with tramadol. Narc Agreement:07/07/2018 Risk tool: 07/07/2018  Okay tofill tramadol? 

## 2018-08-13 ENCOUNTER — Telehealth: Payer: Self-pay | Admitting: Rheumatology

## 2018-08-13 NOTE — Telephone Encounter (Signed)
Patient left a voicemail stating he received a call from [facility] telling him that Dr. Estanislado Pandy would not renew his prescription.  Patient is requesting a return call.

## 2018-08-13 NOTE — Telephone Encounter (Signed)
Called pharmacy to ensure prescription was received and it was. Advised patient the pharmacy is getting the rx ready. Patient verbalized understanding.

## 2018-08-26 ENCOUNTER — Other Ambulatory Visit: Payer: Self-pay | Admitting: Physician Assistant

## 2018-08-26 DIAGNOSIS — M1990 Unspecified osteoarthritis, unspecified site: Secondary | ICD-10-CM

## 2018-08-27 NOTE — Telephone Encounter (Signed)
Last Visit:07/07/2018 Next Visit:12/22/2018 Labs: 07/07/18 WNL PLQ eye exam: 02/23/2018 normal  Okay to refill per Dr. Estanislado Pandy

## 2018-09-02 ENCOUNTER — Other Ambulatory Visit: Payer: Self-pay | Admitting: Cardiology

## 2018-09-08 DIAGNOSIS — Z79899 Other long term (current) drug therapy: Secondary | ICD-10-CM | POA: Diagnosis not present

## 2018-09-08 DIAGNOSIS — H35033 Hypertensive retinopathy, bilateral: Secondary | ICD-10-CM | POA: Diagnosis not present

## 2018-09-30 ENCOUNTER — Other Ambulatory Visit: Payer: Self-pay | Admitting: Rheumatology

## 2018-10-01 ENCOUNTER — Other Ambulatory Visit: Payer: Self-pay | Admitting: Rheumatology

## 2018-10-01 ENCOUNTER — Telehealth: Payer: Self-pay | Admitting: Rheumatology

## 2018-10-01 NOTE — Telephone Encounter (Signed)
ok 

## 2018-10-01 NOTE — Telephone Encounter (Signed)
Patient left a voicemail stating he went to [facility] last night to pick up his prescription of Tramadol and was told that Dr. Estanislado Pandy "denied the refill request."  Patient is requesting a return call ASAP.

## 2018-10-01 NOTE — Telephone Encounter (Signed)
Attempted to contact the patient and left message for patient to call the office.  

## 2018-10-01 NOTE — Telephone Encounter (Signed)
Last Visit:07/07/2018 Next Visit:12/22/2018 UDS:2/18/2020UDS is consistent with tramadol. Narc Agreement:07/07/2018 Risk tool: 07/07/2018  Okay tofill tramadol?

## 2018-10-07 ENCOUNTER — Telehealth: Payer: Self-pay | Admitting: Cardiology

## 2018-10-07 ENCOUNTER — Ambulatory Visit (INDEPENDENT_AMBULATORY_CARE_PROVIDER_SITE_OTHER): Payer: Medicare Other | Admitting: *Deleted

## 2018-10-07 DIAGNOSIS — I428 Other cardiomyopathies: Secondary | ICD-10-CM

## 2018-10-07 LAB — CUP PACEART REMOTE DEVICE CHECK
Battery Remaining Longevity: 84 mo
Battery Voltage: 2.99 V
Brady Statistic AP VP Percent: 3.88 %
Brady Statistic AP VS Percent: 0.05 %
Brady Statistic AS VP Percent: 90.41 %
Brady Statistic AS VS Percent: 5.66 %
Brady Statistic RA Percent Paced: 3.87 %
Brady Statistic RV Percent Paced: 30.91 %
Date Time Interrogation Session: 20200520052204
HighPow Impedance: 64 Ohm
Implantable Lead Implant Date: 20181108
Implantable Lead Implant Date: 20181108
Implantable Lead Implant Date: 20181108
Implantable Lead Location: 753858
Implantable Lead Location: 753859
Implantable Lead Location: 753860
Implantable Lead Model: 4298
Implantable Lead Model: 5076
Implantable Pulse Generator Implant Date: 20181108
Lead Channel Impedance Value: 1026 Ohm
Lead Channel Impedance Value: 1083 Ohm
Lead Channel Impedance Value: 1140 Ohm
Lead Channel Impedance Value: 188.1 Ohm
Lead Channel Impedance Value: 198.837
Lead Channel Impedance Value: 237.661
Lead Channel Impedance Value: 272.032
Lead Channel Impedance Value: 295.076
Lead Channel Impedance Value: 342 Ohm
Lead Channel Impedance Value: 418 Ohm
Lead Channel Impedance Value: 456 Ohm
Lead Channel Impedance Value: 475 Ohm
Lead Channel Impedance Value: 532 Ohm
Lead Channel Impedance Value: 589 Ohm
Lead Channel Impedance Value: 608 Ohm
Lead Channel Impedance Value: 646 Ohm
Lead Channel Impedance Value: 665 Ohm
Lead Channel Impedance Value: 779 Ohm
Lead Channel Pacing Threshold Amplitude: 0.25 V
Lead Channel Pacing Threshold Amplitude: 0.5 V
Lead Channel Pacing Threshold Amplitude: 2.125 V
Lead Channel Pacing Threshold Pulse Width: 0.4 ms
Lead Channel Pacing Threshold Pulse Width: 0.4 ms
Lead Channel Pacing Threshold Pulse Width: 1 ms
Lead Channel Sensing Intrinsic Amplitude: 12.375 mV
Lead Channel Sensing Intrinsic Amplitude: 12.375 mV
Lead Channel Sensing Intrinsic Amplitude: 3.125 mV
Lead Channel Sensing Intrinsic Amplitude: 3.125 mV
Lead Channel Setting Pacing Amplitude: 2 V
Lead Channel Setting Pacing Amplitude: 2.5 V
Lead Channel Setting Pacing Amplitude: 2.5 V
Lead Channel Setting Pacing Pulse Width: 0.4 ms
Lead Channel Setting Pacing Pulse Width: 1 ms
Lead Channel Setting Sensing Sensitivity: 0.3 mV

## 2018-10-07 NOTE — Telephone Encounter (Signed)
Agree this is fine. thx

## 2018-10-07 NOTE — Telephone Encounter (Signed)
New message    Pt c/o medication issue:  1. Name of Medication: hydroxychloroquine (PLAQUENIL) 200 MG tablet  2. How are you currently taking this medication (dosage and times per day)? One tablet twice daily Monday-Friday only  3. Are you having a reaction (difficulty breathing--STAT)?no   4. What is your medication issue?patient has questions about the side effects of this medicine. Please call.

## 2018-10-07 NOTE — Telephone Encounter (Signed)
Patient wanted to make sure it was ok that he is on hydrochloroquine. Concerned that is could have caused his afib and wanted to make sure it was ok to take with Eliquis. Assured him that there was no issue with taking with Eliquis. He should continue to have his CBC monitored often. Do not think medication caused his CHF or afib as it is noted that his LV function improved after resynchronization. Hydrochloroquine also more associate with ventricular arrhythmias. Patient should continue to have his heart rhythm monitored often (has AICD). Patient requested I forward to both Dr. Burt Knack and Dr. Curt Bears so they are aware he is taking Plaquenil.

## 2018-10-08 ENCOUNTER — Other Ambulatory Visit: Payer: Self-pay

## 2018-10-16 ENCOUNTER — Encounter: Payer: Self-pay | Admitting: Cardiology

## 2018-10-16 NOTE — Progress Notes (Signed)
Remote ICD transmission.   

## 2018-10-31 ENCOUNTER — Other Ambulatory Visit: Payer: Self-pay | Admitting: Cardiovascular Disease

## 2018-11-17 ENCOUNTER — Other Ambulatory Visit: Payer: Self-pay | Admitting: Cardiovascular Disease

## 2018-12-08 NOTE — Progress Notes (Signed)
Office Visit Note  Patient: [name]             Date of Birth: 24-Sep-1945           MRN: [id number]             PCP: Biagio Borg, MD Referring: Biagio Borg, MD Visit Date: 12/22/2018 Occupation: @GUAROCC @  Subjective:  Pain and stiffness in joints   History of Present Illness: [name] is a 73 y.o. male with history of seronegative rheumatoid arthritis.  He states he continues to have some discomfort in his right wrist.  He has not noticed any joint swelling.  He has discomfort in his multiple joints due to underlying osteoarthritis.  He complains of pain and stiffness in the cervical and lumbar spine.  He also has discomfort in his bilateral hands, bilateral knees and his feet.  He has a stiffness lasting several minutes during the morning and has nocturnal pain.  Activities of Daily Living:  Patient reports morning stiffness for 20 minutes.   Patient Reports nocturnal pain.  Difficulty dressing/grooming: Reports Difficulty climbing stairs: Reports Difficulty getting out of chair: Reports Difficulty using hands for taps, buttons, cutlery, and/or writing: Reports  Review of Systems  Constitutional: Positive for fatigue. Negative for night sweats.  HENT: Negative for mouth sores, mouth dryness and nose dryness.   Eyes: Positive for dryness. Negative for redness.  Respiratory: Negative for shortness of breath and difficulty breathing.   Cardiovascular: Positive for palpitations. Negative for chest pain, hypertension, irregular heartbeat and swelling in legs/feet.       Pacemaker  Gastrointestinal: Negative for constipation and diarrhea.  Endocrine: Negative for increased urination.  Musculoskeletal: Positive for arthralgias, joint pain, joint swelling and morning stiffness. Negative for myalgias, muscle weakness, muscle tenderness and myalgias.  Skin: Negative for color change, rash, hair loss, nodules/bumps, skin tightness, ulcers and sensitivity to sunlight.   Allergic/Immunologic: Negative for susceptible to infections.  Neurological: Negative for dizziness, fainting, memory loss, night sweats and weakness ( ).  Hematological: Negative for swollen glands.  Psychiatric/Behavioral: Negative for depressed mood and sleep disturbance. The patient is not nervous/anxious.     PMFS History:  Patient Active Problem List   Diagnosis Date Noted  . Preventative health care 06/25/2018  . Incarcerated ventral hernia 03/20/2018  . DCM (dilated cardiomyopathy) ([place]) 02/11/2017  . Chronic systolic heart failure ([place]) 01/15/2017  . History of pneumonia 10/23/2016  . Osteoarthritis 10/23/2016  . Primary osteoarthritis of both feet 10/04/2016  . Primary osteoarthritis of left knee 10/04/2016  . DDD (degenerative disc disease), lumbar 10/04/2016  . Hemoptysis 05/27/2016  . Overweight (BMI 25.0-29.9) 03/14/2016  . 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  . Parotid mass 08/23/2015  . Dyspnea 11/29/2014  . Synovial cyst of lumbar facet joint 09/26/2014  . Incisional hernia, without obstruction or gangrene 12/06/2013  . Nausea alone 08/27/2013  . Colon cancer screening 08/27/2013  . 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 Benign prostatic hyperplasia with urinary obstruction 06/18/2010  . Other specified disorder of stomach and duodenum 12/16/2008  . 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 GERD 09/02/2008  . COLONIC POLYPS, HYPERPLASTIC 05/24/2007  . Obstructive sleep apnea 05/24/2007  . ALLERGIC RHINITIS 05/24/2007  . ESOPHAGEAL STRICTURE 05/24/2007  . HIATAL HERNIA 05/24/2007  . Hyperlipidemia 02/06/2007  . Essential hypertension 02/06/2007  . CAD (coronary artery disease) 02/06/2007  . Primary osteoarthritis of both hands 02/06/2007  . Neck pain on right side 02/06/2007  Past Medical History:  Diagnosis Date  . AICD (automatic cardioverter/defibrillator) present   . Allergic rhinitis   . Allergy   . Anginal pain ([place]) 05/26/14   chest pain  after chasing dog  . CAD (coronary artery disease)    hx of stent- 2005 RCA  . Cancer ([place])    skin cancer  . Cataract    beginning stage both eyes  . CHF (congestive heart failure) (HCC)    pacemaker [employer]    . Chronic back pain    intermittent  . Constipation   . Dizziness   . Dysrhythmia    right bundle branch block   . Esophageal stricture   . GERD (gastroesophageal reflux disease)   . Hemoptysis   . Hiatal hernia   . History of colonic polyps    hyperplastic  . HTN (hypertension)   . Hyperlipidemia   . Hypertrophy of prostate with urinary obstruction and other lower urinary tract symptoms (LUTS)   . OA (osteoarthritis)   . OSA (obstructive sleep apnea)    cpap- 10   . 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 Pneumonia   . Pre-diabetes    no medications  . Shortness of breath    with exertion  . Sleep apnea    cpap  . Testicular hypofunction     Family History  Problem Relation Age of Onset  . Coronary artery disease Father   . Diabetes Father   . Sudden death Father        due to heart disease  . Heart disease Father   . Heart disease Mother   . Hypertension Other   . Stomach cancer Paternal Grandmother   . COPD Brother   . Arthritis Brother   . Heart failure Brother   . Cancer Brother        lung   . Arthritis Daughter   . Colon cancer Neg Hx   . Esophageal cancer Neg Hx   . Colon polyps Neg Hx   . Ulcerative colitis Neg Hx    Past Surgical History:  Procedure Laterality Date  . BIV ICD INSERTION CRT-D N/A 03/27/2017   Procedure: BIV ICD INSERTION CRT-D;  Surgeon: Constance Haw, MD;  Location: [facility];  Service: Cardiovascular;  Laterality: N/A;  . CARDIAC CATHETERIZATION     '05, last 2009, showing patent RCA stent  . COLONOSCOPY  12/2007   HYPERPLASTIC POLYP  . COLONOSCOPY WITH PROPOFOL N/A 06/02/2014   Procedure: COLONOSCOPY WITH  PROPOFOL;  Surgeon: [name], MD;  Location: [facility];  Service: Endoscopy;  Laterality: N/A;  . CORONARY ANGIOPLASTY  08/2003  . ESOPHAGOGASTRODUODENOSCOPY (EGD) WITH PROPOFOL N/A 06/02/2014   Procedure: ESOPHAGOGASTRODUODENOSCOPY (EGD) WITH PROPOFOL;  Surgeon: [name], MD;  Location: [facility];  Service: Endoscopy;  Laterality: N/A;  . hernia surgery x 3     Bilateral Inguinal, Umbicial  . INSERTION OF MESH N/A 03/20/2018   Procedure: INSERTION OF MESH;  Surgeon: Alphonsa Overall, MD;  Location: [facility];  Service: General;  Laterality: N/A;  . IRRIGATION AND DEBRIDEMENT ABSCESS Left 08/14/2012   Procedure: IRRIGATION AND DEBRIDEMENT LEFT INGUINAL BOIL ;  Surgeon: Ailene Rud, MD;  Location: [facility];  Service: Urology;  Laterality: Left;  . JOINT REPLACEMENT Right 2017  . KNEE ARTHROPLASTY    . LEFT HEART CATHETERIZATION WITH CORONARY ANGIOGRAM N/A 05/26/2014   Procedure: LEFT HEART  CATHETERIZATION WITH CORONARY ANGIOGRAM;  Surgeon: Blane Ohara, MD;  Location: [facility];  Service: Cardiovascular;  Laterality: N/A;  . LUMBAR LAMINECTOMY/DECOMPRESSION MICRODISCECTOMY Left 09/26/2014   Procedure: Lumbar Laminectomy for resection of synovial cyst  Lumbar five- sacral one left;  Surgeon: Kary Kos, MD;  Location: [facility];  Service: Neurosurgery;  Laterality: Left;  . MOLE REMOVAL Left 03/20/2018   Procedure: MOLE REMOVAL;  Surgeon: Alphonsa Overall, MD;  Location: [facility];  Service: General;  Laterality: Left;  . Oral surg to removed growth from ?sinus  10/2010   ?Dermoid removed by DrRiggs  . PACEMAKER INSERTION  04/03/2017  . POLYPECTOMY    . RCA stenting     '05 RCA  . right toe surgery  Right    Cyst   . RIGHT/LEFT HEART CATH AND CORONARY ANGIOGRAPHY N/A 01/15/2017   Procedure: RIGHT/LEFT HEART CATH AND CORONARY ANGIOGRAPHY;  Surgeon: [name], MD;  Location: [facility];  Service: Cardiovascular;  Laterality: N/A;  . s/p right knee arthroscopy  2005  .  thumb surgery Right   . TOTAL KNEE ARTHROPLASTY Right 03/12/2016   Procedure: RIGHT TOTAL KNEE ARTHROPLASTY;  Surgeon: Paralee Cancel, MD;  Location: [facility];  Service: Orthopedics;  Laterality: Right;  . UPPER GASTROINTESTINAL ENDOSCOPY    . VENTRAL HERNIA REPAIR N/A 03/20/2018   Procedure: LAPAROSCOPIC VENTRAL INCISIONAL  HERNIA ERAS PATHWAY;  Surgeon: Alphonsa Overall, MD;  Location: [facility];  Service: General;  Laterality: N/A;   Social History   Social History Narrative  . Not on file   Immunization History  Administered Date(s) Administered  . Influenza Split 03/21/2011  . Influenza Whole 04/06/2009, 04/09/2010  . Influenza, High Dose Seasonal PF 04/20/2015, 04/15/2016, 02/21/2017  . Influenza,inj,Quad PF,6+ Mos 03/24/2014  . Influenza-Unspecified 04/20/2015, 02/26/2018  . Pneumococcal Conjugate-13 11/29/2014  . Pneumococcal Polysaccharide-23 06/25/2018  . Tdap 11/29/2014  . Zoster 05/20/2006  . Zoster Recombinat (Shingrix) 06/07/2018, 09/26/2018     Objective: Vital Signs: BP 125/76 (BP Location: Left Arm, Patient Position: Sitting, Cuff Size: Normal)   Pulse 77   Resp 14   Ht 5\' 8"  (1.727 m)   Wt 190 lb 12.8 oz (86.5 kg)   BMI 29.01 kg/m    Physical Exam Vitals signs and nursing note reviewed.  Constitutional:      Appearance: He is well-developed.  HENT:     Head: Normocephalic and atraumatic.  Eyes:     Conjunctiva/sclera: Conjunctivae normal.     Pupils: Pupils are equal, round, and reactive to light.  Neck:     Musculoskeletal: Normal range of motion and neck supple.  Cardiovascular:     Rate and Rhythm: Normal rate and regular rhythm.     Heart sounds: Normal heart sounds.  Pulmonary:     Effort: Pulmonary effort is normal.     Breath sounds: Normal breath sounds.  Abdominal:     General: Bowel sounds are normal.     Palpations: Abdomen is soft.  Skin:    General: Skin is warm and dry.     Capillary Refill: Capillary refill takes less than 2 seconds.   Neurological:     Mental Status: He is alert and oriented to person, place, and time.  Psychiatric:        Behavior: Behavior normal.      Musculoskeletal Exam: He has limited range of motion of his cervical spine.  He also has discomfort range of motion of his lumbar spine.  Shoulder joints and  elbow joints with good range of motion.  He had tenderness on palpation over medial aspect of his right wrist.  Bilateral CMC PIP and DIP thickening was noted.  No synovitis was noted.  He has good range of motion his hip joints and knee joints.  PIP and DIP thickening was noted.  CDAI Exam: CDAI Score: 0.4  Patient Global: 2 mm; Provider Global: 2 mm Swollen: 0 ; Tender: 0  Joint Exam   No joint exam has been documented for this visit   There is currently no information documented on the homunculus. Go to the Rheumatology activity and complete the homunculus joint exam.  Investigation: No additional findings.  Imaging: No results found.  Recent Labs: Lab Results  Component Value Date   WBC 6.2 07/07/2018   HGB 14.6 07/07/2018   PLT 255 07/07/2018   NA 138 07/07/2018   K 4.2 07/07/2018   CL 104 07/07/2018   CO2 27 07/07/2018   GLUCOSE 86 07/07/2018   BUN 18 07/07/2018   CREATININE 0.99 07/07/2018   BILITOT 1.1 07/07/2018   ALKPHOS 78 10/23/2016   AST 19 07/07/2018   ALT 16 07/07/2018   PROT 6.6 07/07/2018   ALBUMIN 4.4 10/23/2016   CALCIUM 9.7 07/07/2018   GFRAA 87 07/07/2018    Speciality Comments: PLQ eye exam: 02/23/2018 normal. Dr. Francis Dowse. Follow up in 1 year.  Procedures:  No procedures performed Allergies: Sulfonamide derivatives   Assessment / Plan:     Visit Diagnoses: Rheumatoid arthritis involving multiple sites with positive rheumatoid factor (HCC) - CCP +, RF -, 14-3-3 eta negative, Sed rate WNL -he continues to have discomfort in his joints.  No synovitis was noted on examination.  He has synovial thickening.  I have advised him to contact me in  case he develops any increased swelling.  At this point I would not advise more aggressive therapy.  High risk medication use - PLQ 200 mg 1 tablet BID M-F.eye exam: 02/23/2018.  He will be getting eye examination in October.- Plan: CBC with Differential/Platelet, COMPLETE METABOLIC PANEL WITH GFR, today and then every 5 months.  Medication monitoring encounter -he has chronic pain and discomfort from underlying osteoarthritis.  His pain is manageable with tramadol. UDS: Was obtained today.  - Plan: Pain Mgmt, Profile 5 w/Conf, U, Pain Mgmt, Tramadol w/medMATCH, U,   Primary osteoarthritis of both hands -he has incomplete fist formation and significant stiffness and discomfort in his hands.  Primary osteoarthritis of left knee -he has ongoing discomfort in his knee.  Status post right knee replacement -doing well.  Primary osteoarthritis of both feet -he has chronic pain.  Proper fitting shoes were discussed.  DDD (degenerative disc disease), lumbar-chronic pain.  History of coronary artery disease -followed by cardiologist.  Pacemaker  History of CHF (congestive heart failure)   History of gastric polyp   History of hyperlipidemia   History of gastroesophageal reflux (GERD)   History of hypertension   History of BPH  History of sleep apnea   History of colon polyps     Orders: Orders Placed This Encounter  Procedures  . Pain Mgmt, Profile 5 w/Conf, U  . Pain Mgmt, Tramadol w/medMATCH, U  . CBC with Differential/Platelet  . COMPLETE METABOLIC PANEL WITH GFR   Meds ordered this encounter  Medications  . hydroxychloroquine (PLAQUENIL) 200 MG tablet    Sig: TAKE 1 TABLET BY MOUTH TWICE DAILY MONDAY THROUGH FRIDAY ONLY. DO NOT TAKE SATURDAY OR SUNDAY  Dispense:  120 tablet    Refill:  0    M19.90 Inflammatory arthritis    .  Follow-Up Instructions: Return in about 5 months (around 05/24/2019) for Rheumatoid arthritis, Osteoarthritis.   Bo Merino, MD   Note - This record has been created using Editor, commissioning.  Chart creation errors have been sought, but may not always  have been located. Such creation errors do not reflect on  the standard of medical care.

## 2018-12-15 ENCOUNTER — Other Ambulatory Visit: Payer: Self-pay | Admitting: Rheumatology

## 2018-12-16 NOTE — Telephone Encounter (Signed)
Last Visit: 07/07/18  Next Visit: 12/22/18 UDS: 07/07/18 Narc Agreement: 07/07/18  Last Fill: 10/01/18  Okay to refill Tramadol?

## 2018-12-22 ENCOUNTER — Encounter: Payer: Self-pay | Admitting: Rheumatology

## 2018-12-22 ENCOUNTER — Other Ambulatory Visit: Payer: Self-pay

## 2018-12-22 ENCOUNTER — Ambulatory Visit (INDEPENDENT_AMBULATORY_CARE_PROVIDER_SITE_OTHER): Payer: Medicare Other | Admitting: Rheumatology

## 2018-12-22 VITALS — BP 125/76 | HR 77 | Resp 14 | Ht 68.0 in | Wt 190.8 lb

## 2018-12-22 DIAGNOSIS — M19041 Primary osteoarthritis, right hand: Secondary | ICD-10-CM

## 2018-12-22 DIAGNOSIS — Z8679 Personal history of other diseases of the circulatory system: Secondary | ICD-10-CM

## 2018-12-22 DIAGNOSIS — Z5181 Encounter for therapeutic drug level monitoring: Secondary | ICD-10-CM | POA: Diagnosis not present

## 2018-12-22 DIAGNOSIS — I2510 Atherosclerotic heart disease of native coronary artery without angina pectoris: Secondary | ICD-10-CM

## 2018-12-22 DIAGNOSIS — M1712 Unilateral primary osteoarthritis, left knee: Secondary | ICD-10-CM

## 2018-12-22 DIAGNOSIS — Z8639 Personal history of other endocrine, nutritional and metabolic disease: Secondary | ICD-10-CM

## 2018-12-22 DIAGNOSIS — Z8719 Personal history of other diseases of the digestive system: Secondary | ICD-10-CM

## 2018-12-22 DIAGNOSIS — Z860100 Personal history of colon polyps, unspecified: Secondary | ICD-10-CM

## 2018-12-22 DIAGNOSIS — Z79899 Other long term (current) drug therapy: Secondary | ICD-10-CM | POA: Diagnosis not present

## 2018-12-22 DIAGNOSIS — M19071 Primary osteoarthritis, right ankle and foot: Secondary | ICD-10-CM | POA: Diagnosis not present

## 2018-12-22 DIAGNOSIS — M19072 Primary osteoarthritis, left ankle and foot: Secondary | ICD-10-CM

## 2018-12-22 DIAGNOSIS — M5136 Other intervertebral disc degeneration, lumbar region: Secondary | ICD-10-CM | POA: Diagnosis not present

## 2018-12-22 DIAGNOSIS — Z8669 Personal history of other diseases of the nervous system and sense organs: Secondary | ICD-10-CM

## 2018-12-22 DIAGNOSIS — M0579 Rheumatoid arthritis with rheumatoid factor of multiple sites without organ or systems involvement: Secondary | ICD-10-CM

## 2018-12-22 DIAGNOSIS — Z950 Presence of cardiac pacemaker: Secondary | ICD-10-CM

## 2018-12-22 DIAGNOSIS — M1990 Unspecified osteoarthritis, unspecified site: Secondary | ICD-10-CM

## 2018-12-22 DIAGNOSIS — Z96651 Presence of right artificial knee joint: Secondary | ICD-10-CM

## 2018-12-22 DIAGNOSIS — Z87438 Personal history of other diseases of male genital organs: Secondary | ICD-10-CM

## 2018-12-22 DIAGNOSIS — M19042 Primary osteoarthritis, left hand: Secondary | ICD-10-CM

## 2018-12-22 DIAGNOSIS — M51369 Other intervertebral disc degeneration, lumbar region without mention of lumbar back pain or lower extremity pain: Secondary | ICD-10-CM

## 2018-12-22 MED ORDER — HYDROXYCHLOROQUINE SULFATE 200 MG PO TABS: ORAL_TABLET | ORAL | 0 refills | Status: DC

## 2018-12-22 NOTE — Patient Instructions (Signed)
Standing Labs We placed an order today for your standing lab work.    Please come back and get your standing labs in 5 months at follow-up visit.  We have open lab daily Monday through Thursday from 8:30-12:30 PM and 1:30-4:30 PM and Friday from 8:30-12:30 PM and 1:30 -4:00 PM at the office of Dr. Bo Merino.   You may experience shorter wait times on Monday and Friday afternoons. The office is located at [address] [place], [place], [place] [address] No appointment is necessary.   Labs are drawn by [facility].  You may receive a bill from [facility] for your lab work.  If you wish to have your labs drawn at another location, please call the office 24 hours in advance to send orders.  If you have any questions regarding directions or hours of operation,  please call [phone].   Just as a reminder please drink plenty of water prior to coming for your lab work. Thanks!

## 2018-12-23 NOTE — Progress Notes (Signed)
CBC and CMP are within normal limits.

## 2018-12-24 ENCOUNTER — Encounter: Payer: Self-pay | Admitting: Internal Medicine

## 2018-12-24 ENCOUNTER — Ambulatory Visit (INDEPENDENT_AMBULATORY_CARE_PROVIDER_SITE_OTHER): Payer: Medicare Other | Admitting: Internal Medicine

## 2018-12-24 ENCOUNTER — Other Ambulatory Visit: Payer: Self-pay

## 2018-12-24 VITALS — BP 124/86 | HR 88 | Temp 99.1°F | Ht 68.0 in | Wt 190.0 lb

## 2018-12-24 DIAGNOSIS — E785 Hyperlipidemia, unspecified: Secondary | ICD-10-CM | POA: Diagnosis not present

## 2018-12-24 DIAGNOSIS — M069 Rheumatoid arthritis, unspecified: Secondary | ICD-10-CM | POA: Insufficient documentation

## 2018-12-24 DIAGNOSIS — R739 Hyperglycemia, unspecified: Secondary | ICD-10-CM

## 2018-12-24 DIAGNOSIS — I10 Essential (primary) hypertension: Secondary | ICD-10-CM

## 2018-12-24 DIAGNOSIS — I5022 Chronic systolic (congestive) heart failure: Secondary | ICD-10-CM | POA: Diagnosis not present

## 2018-12-24 DIAGNOSIS — R7303 Prediabetes: Secondary | ICD-10-CM | POA: Insufficient documentation

## 2018-12-24 DIAGNOSIS — I2510 Atherosclerotic heart disease of native coronary artery without angina pectoris: Secondary | ICD-10-CM | POA: Diagnosis not present

## 2018-12-24 LAB — PAIN MGMT, PROFILE 5 W/CONF, U
Amphetamines: NEGATIVE ng/mL
Barbiturates: NEGATIVE ng/mL
Benzodiazepines: NEGATIVE ng/mL
Cocaine Metabolite: NEGATIVE ng/mL
Creatinine: 86.6 mg/dL
Marijuana Metabolite: NEGATIVE ng/mL
Methadone Metabolite: NEGATIVE ng/mL
Opiates: NEGATIVE ng/mL
Oxidant: NEGATIVE ug/mL
Oxycodone: NEGATIVE ng/mL
pH: 5.9 (ref 4.5–9.0)

## 2018-12-24 LAB — COMPLETE METABOLIC PANEL WITH GFR
AG Ratio: 1.7 (calc) (ref 1.0–2.5)
ALT: 18 U/L (ref 9–46)
AST: 18 U/L (ref 10–35)
Albumin: 4.5 g/dL (ref 3.6–5.1)
Alkaline phosphatase (APISO): 69 U/L (ref 35–144)
BUN: 15 mg/dL (ref 7–25)
CO2: 28 mmol/L (ref 20–32)
Calcium: 10.3 mg/dL (ref 8.6–10.3)
Chloride: 103 mmol/L (ref 98–110)
Creat: 0.92 mg/dL (ref 0.70–1.18)
GFR, Est African American: 95 mL/min/{1.73_m2} (ref >=60)
GFR, Est Non African American: 82 mL/min/{1.73_m2} (ref >=60)
Globulin: 2.6 g/dL (calc) (ref 1.9–3.7)
Glucose, Bld: 102 mg/dL — ABNORMAL HIGH (ref 65–99)
Potassium: 4.4 mmol/L (ref 3.5–5.3)
Sodium: 138 mmol/L (ref 135–146)
Total Bilirubin: 1 mg/dL (ref 0.2–1.2)
Total Protein: 7.1 g/dL (ref 6.1–8.1)

## 2018-12-24 LAB — CBC WITH DIFFERENTIAL/PLATELET
Absolute Monocytes: 804 cells/uL (ref 200–950)
Basophils Absolute: 60 cells/uL (ref 0–200)
Basophils Relative: 0.9 %
Eosinophils Absolute: 181 cells/uL (ref 15–500)
Eosinophils Relative: 2.7 %
HCT: 46.8 % (ref 38.5–50.0)
Hemoglobin: 15.8 g/dL (ref 13.2–17.1)
Lymphs Abs: 1970 cells/uL (ref 850–3900)
MCH: 29.9 pg (ref 27.0–33.0)
MCHC: 33.8 g/dL (ref 32.0–36.0)
MCV: 88.5 fL (ref 80.0–100.0)
MPV: 10.5 fL (ref 7.5–12.5)
Monocytes Relative: 12 %
Neutro Abs: 3685 cells/uL (ref 1500–7800)
Neutrophils Relative %: 55 %
Platelets: 280 10*3/uL (ref 140–400)
RBC: 5.29 10*6/uL (ref 4.20–5.80)
RDW: 12.9 % (ref 11.0–15.0)
Total Lymphocyte: 29.4 %
WBC: 6.7 10*3/uL (ref 3.8–10.8)

## 2018-12-24 LAB — PAIN MGMT, TRAMADOL W/MEDMATCH, U
Desmethyltramadol: 4827 ng/mL
Tramadol: 5201 ng/mL

## 2018-12-24 LAB — POCT GLYCOSYLATED HEMOGLOBIN (HGB A1C): Hemoglobin A1C: 5.7 % — AB (ref 4.0–5.6)

## 2018-12-24 NOTE — Progress Notes (Signed)
Subjective:    Patient ID: [name], male    DOB: 1946/02/14, 73 y.o.   MRN: [id number]  HPI  Here to f/u; overall doing ok,  Pt denies chest pain, increasing sob or doe, wheezing, orthopnea, PND, increased LE swelling, palpitations, dizziness or syncope.  Pt denies new neurological symptoms such as new headache, or facial or extremity weakness or numbness.  Pt denies polydipsia, polyuria, or low sugar episode.  Pt states overall good compliance with meds, mostly trying to follow appropriate diet, with wt overall stable,  but little exercise however.   Pt denies fever, wt loss, night sweats, loss of appetite, or other constitutional symptoms.  Does not feel ill today Past Medical History:  Diagnosis Date  . AICD (automatic cardioverter/defibrillator) present   . Allergic rhinitis   . Allergy   . Anginal pain ([place]) 05/26/14   chest pain after chasing dog  . CAD (coronary artery disease)    hx of stent- 2005 RCA  . Cancer ([place])    skin cancer  . Cataract    beginning stage both eyes  . CHF (congestive heart failure) (HCC)    pacemaker Medtronic    . Chronic back pain    intermittent  . Constipation   . Dizziness   . Dysrhythmia    right bundle branch block   . Esophageal stricture   . GERD (gastroesophageal reflux disease)   . Hemoptysis   . Hiatal hernia   . History of colonic polyps    hyperplastic  . HTN (hypertension)   . Hyperlipidemia   . Hypertrophy of prostate with urinary obstruction and other lower urinary tract symptoms (LUTS)   . OA (osteoarthritis)   . OSA (obstructive sleep apnea)    cpap- 10   . 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 Pneumonia   . Pre-diabetes    no medications  . Shortness of breath    with exertion  . Sleep apnea    cpap  . Testicular hypofunction    Past Surgical History:  Procedure Laterality Date  . BIV ICD INSERTION CRT-D N/A 03/27/2017   Procedure: BIV ICD INSERTION CRT-D;  Surgeon: Constance Haw, MD;  Location: [place] [facility];  Service: Cardiovascular;  Laterality: N/A;  . CARDIAC CATHETERIZATION     '05, last 2009, showing patent RCA stent  . COLONOSCOPY  12/2007   HYPERPLASTIC POLYP  . COLONOSCOPY WITH PROPOFOL N/A 06/02/2014   Procedure: COLONOSCOPY WITH PROPOFOL;  Surgeon: [name], MD;  [facility];  Service: Endoscopy;  Laterality: N/A;  . CORONARY ANGIOPLASTY  08/2003  . ESOPHAGOGASTRODUODENOSCOPY (EGD) WITH PROPOFOL N/A 06/02/2014   Procedure: ESOPHAGOGASTRODUODENOSCOPY (EGD) WITH PROPOFOL;  Surgeon: [name], MD;  [facility];  Service: Endoscopy;  Laterality: N/A;  . hernia surgery x 3     Bilateral Inguinal, Umbicial  . INSERTION OF MESH N/A 03/20/2018   Procedure: INSERTION OF MESH;  Surgeon: Alphonsa Overall, MD;  Location: [place];  Service: General;  Laterality: N/A;  . IRRIGATION AND DEBRIDEMENT ABSCESS Left 08/14/2012   Procedure: IRRIGATION AND DEBRIDEMENT LEFT INGUINAL BOIL ;  Surgeon: Ailene Rud, MD;  [facility];  Service: Urology;  Laterality: Left;  . JOINT REPLACEMENT Right 2017  . KNEE ARTHROPLASTY    . LEFT HEART CATHETERIZATION WITH CORONARY ANGIOGRAM N/A 05/26/2014   Procedure: LEFT HEART CATHETERIZATION WITH CORONARY ANGIOGRAM;  Surgeon:  Blane Ohara, MD;  Location: [facility];  Service: Cardiovascular;  Laterality: N/A;  . LUMBAR LAMINECTOMY/DECOMPRESSION MICRODISCECTOMY Left 09/26/2014   Procedure: Lumbar Laminectomy for resection of synovial cyst  Lumbar five- sacral one left;  Surgeon: Kary Kos, MD;  Location: [place] [facility];  Service: Neurosurgery;  Laterality: Left;  . MOLE REMOVAL Left 03/20/2018   Procedure: MOLE REMOVAL;  Surgeon: Alphonsa Overall, MD;  Location: [place];  Service: General;  Laterality: Left;  . Oral surg to removed growth from ?sinus  10/2010   ?Dermoid removed by DrRiggs  . PACEMAKER INSERTION  04/03/2017  . POLYPECTOMY     . RCA stenting     '05 RCA  . right toe surgery  Right    Cyst   . RIGHT/LEFT HEART CATH AND CORONARY ANGIOGRAPHY N/A 01/15/2017   Procedure: RIGHT/LEFT HEART CATH AND CORONARY ANGIOGRAPHY;  Surgeon: [name], MD;  Location: [place] [facility];  Service: Cardiovascular;  Laterality: N/A;  . s/p right knee arthroscopy  2005  . thumb surgery Right   . TOTAL KNEE ARTHROPLASTY Right 03/12/2016   Procedure: RIGHT TOTAL KNEE ARTHROPLASTY;  Surgeon: Paralee Cancel, MD;  [facility];  Service: Orthopedics;  Laterality: Right;  . UPPER GASTROINTESTINAL ENDOSCOPY    . VENTRAL HERNIA REPAIR N/A 03/20/2018   Procedure: LAPAROSCOPIC VENTRAL INCISIONAL  HERNIA ERAS PATHWAY;  Surgeon: Alphonsa Overall, MD;  Location: [place];  Service: General;  Laterality: N/A;    reports that he quit smoking about 28 years ago. He has never used smokeless tobacco. He reports current alcohol use. He reports that he does not use drugs. family history includes Arthritis in his brother and daughter; COPD in his brother; Cancer in his brother; Coronary artery disease in his father; Diabetes in his father; Heart disease in his father and mother; Heart failure in his brother; Hypertension in an other family member; Stomach cancer in his paternal grandmother; Sudden death in his father. Allergies  Allergen Reactions  . Sulfonamide Derivatives Rash        Current Outpatient Medications on File Prior to Visit  Medication Sig Dispense Refill  . atorvastatin (LIPITOR) 20 MG tablet TAKE 1 TABLET BY MOUTH DAILY 90 tablet 2  . b complex vitamins tablet Take 1 tablet by mouth daily with lunch.     . calcium carbonate (OSCAL) 1500 (600 Ca) MG TABS tablet Take 600 mg by mouth every evening.    . carvedilol (COREG) 25 MG tablet TAKE 1 TABLET BY MOUTH TWICE DAILY 180 tablet 2  . CINNAMON PO Take 1 tablet by mouth daily with lunch.     . Coenzyme Q10 (CO Q 10) 100 MG CAPS Take 1 capsule by mouth daily with lunch.     . diclofenac  sodium (VOLTAREN) 1 % GEL Apply 1 application topically 4 (four) times daily as needed (pain).   3  . ELIQUIS 5 MG TABS tablet TAKE 1 TABLET(5 MG) BY MOUTH TWICE DAILY 60 tablet 2  . ENTRESTO 49-51 MG TAKE 1 TABLET BY MOUTH 2 TIMES DAILY 470 tablet 3  . folic acid (FOLVITE) 962 MCG tablet Take 400 mcg by mouth daily.     . Glucosamine HCl (GLUCOSAMINE PO) Take 1,500 mg by mouth every evening.    . hydroxychloroquine (PLAQUENIL) 200 MG tablet TAKE 1 TABLET BY MOUTH TWICE DAILY MONDAY THROUGH FRIDAY ONLY. DO NOT TAKE SATURDAY OR SUNDAY 120 tablet 0  . hydroxypropyl methylcellulose / hypromellose (ISOPTO TEARS / GONIOVISC) 2.5 % ophthalmic  solution Place 1 drop into both eyes 4 (four) times daily as needed for dry eyes.     . Multiple Vitamin (MULTIVITAMIN) capsule Take 1 capsule by mouth daily.      . Multiple Vitamins-Minerals (OCUVITE PO) Take 1 tablet by mouth at bedtime.    . NON FORMULARY CPAP machine with sleep.    . pantoprazole (PROTONIX) 40 MG tablet TAKE 1 TABLET BY MOUTH EVERY DAY BEFORE BREAKFAST GENERIC EQUIVALENT FOR PROTONIX 90 tablet 3  . Probiotic Product (PROBIOTIC DAILY PO) Take 1 capsule by mouth daily with lunch.     . Saw Palmetto, Serenoa repens, (SAW PALMETTO PO) Take 1 capsule by mouth every evening.    Marland Kitchen spironolactone (ALDACTONE) 25 MG tablet TAKE 1/2 TABLET(12.5 MG) BY MOUTH DAILY 45 tablet 0  . tamsulosin (FLOMAX) 0.4 MG CAPS capsule Take 0.4 mg by mouth at bedtime.    . Testosterone 20 % CREA Apply 2 mLs topically daily. Rub on shoulder    . traMADol (ULTRAM) 50 MG tablet TAKE 1 TABLET BY MOUTH EVERY DAY AT BEDTIME AS NEEDED 30 tablet 0  . TURMERIC PO Take 1 tablet by mouth daily with lunch.      No current facility-administered medications on file prior to visit.    Review of Systems  Constitutional: Negative for other unusual diaphoresis or sweats HENT: Negative for ear discharge or swelling Eyes: Negative for other worsening visual disturbances Respiratory:  Negative for stridor or other swelling  Gastrointestinal: Negative for worsening distension or other blood Genitourinary: Negative for retention or other urinary change Musculoskeletal: Negative for other MSK pain or swelling Skin: Negative for color change or other new lesions Neurological: Negative for worsening tremors and other numbness  Psychiatric/Behavioral: Negative for worsening agitation or other fatigue All other system neg per pt    Objective:   Physical Exam BP 124/86   Pulse 88   Temp 99.1 F (37.3 C) (Oral)   Ht 5\' 8"  (1.727 m)   Wt 190 lb (86.2 kg)   SpO2 94%   BMI 28.89 kg/m  VS noted,  Constitutional: Pt appears in NAD HENT: Head: NCAT.  Right Ear: External ear normal.  Left Ear: External ear normal.  Eyes: . Pupils are equal, round, and reactive to light. Conjunctivae and EOM are normal Nose: without d/c or deformity Neck: Neck supple. Gross normal ROM Cardiovascular: Normal rate and regular rhythm.   Pulmonary/Chest: Effort normal and breath sounds without rales or wheezing.  Abd:  Soft, NT, ND, + BS, no organomegaly Neurological: Pt is alert. At baseline orientation, motor grossly intact Skin: Skin is warm. No rashes, other new lesions, no LE edema Psychiatric: Pt behavior is normal without agitation  No other exam findings  POCT glycosylated hemoglobin (Hb A1C) Order: [id number] Status:  Final result Visible to patient:  No (not released) Dx:  Hyperglycemia  Ref Range & Units 16:30 53mo ago 86yr ago  Hemoglobin A1C 4.0 - 5.6 % 5.7Abnormal   6.0 R, CM  5.7 R,           Assessment & Plan:

## 2018-12-24 NOTE — Patient Instructions (Signed)
Your A1c was OK today  Please continue all other medications as before, and refills have been done if requested.  Please have the pharmacy call with any other refills you may need.  Please continue your efforts at being more active, low cholesterol diet, and weight control.  Please keep your appointments with your specialists as you may have planned  Please return in 6 months, or sooner if needed

## 2018-12-24 NOTE — Assessment & Plan Note (Signed)
stable overall by history and exam, recent data reviewed with pt, and pt to continue medical treatment as before,  to f/u any worsening symptoms or concerns  

## 2018-12-24 NOTE — Progress Notes (Signed)
WNLs

## 2018-12-30 DIAGNOSIS — N401 Enlarged prostate with lower urinary tract symptoms: Secondary | ICD-10-CM | POA: Diagnosis not present

## 2018-12-30 DIAGNOSIS — N400 Enlarged prostate without lower urinary tract symptoms: Secondary | ICD-10-CM | POA: Diagnosis not present

## 2018-12-30 DIAGNOSIS — E291 Testicular hypofunction: Secondary | ICD-10-CM | POA: Diagnosis not present

## 2019-01-05 ENCOUNTER — Ambulatory Visit: Payer: Medicare Other | Admitting: Physician Assistant

## 2019-01-05 ENCOUNTER — Encounter: Payer: Self-pay | Admitting: Physician Assistant

## 2019-01-05 ENCOUNTER — Encounter: Payer: Self-pay | Admitting: Cardiology

## 2019-01-05 ENCOUNTER — Ambulatory Visit (INDEPENDENT_AMBULATORY_CARE_PROVIDER_SITE_OTHER): Payer: Medicare Other | Admitting: Cardiology

## 2019-01-05 ENCOUNTER — Other Ambulatory Visit: Payer: Self-pay

## 2019-01-05 VITALS — BP 120/76 | HR 69 | Ht 68.0 in | Wt 190.0 lb

## 2019-01-05 VITALS — BP 120/76 | HR 69 | Ht 68.0 in | Wt 190.4 lb

## 2019-01-05 DIAGNOSIS — I10 Essential (primary) hypertension: Secondary | ICD-10-CM

## 2019-01-05 DIAGNOSIS — E785 Hyperlipidemia, unspecified: Secondary | ICD-10-CM

## 2019-01-05 DIAGNOSIS — I480 Paroxysmal atrial fibrillation: Secondary | ICD-10-CM

## 2019-01-05 DIAGNOSIS — I255 Ischemic cardiomyopathy: Secondary | ICD-10-CM

## 2019-01-05 DIAGNOSIS — I5022 Chronic systolic (congestive) heart failure: Secondary | ICD-10-CM

## 2019-01-05 DIAGNOSIS — Z95810 Presence of automatic (implantable) cardiac defibrillator: Secondary | ICD-10-CM

## 2019-01-05 DIAGNOSIS — I2510 Atherosclerotic heart disease of native coronary artery without angina pectoris: Secondary | ICD-10-CM

## 2019-01-05 NOTE — Patient Instructions (Addendum)
Medication Instructions:  Your physician recommends that you continue on your current medications as directed. Please refer to the Current Medication list given to you today.  If you need a refill on your cardiac medications before your next appointment, please call your pharmacy.   Lab work: FUTURE: Lipids on 01/27/2019 (Lab is open from 7:30 AM to 4:30 PM)  If you have labs (blood work) drawn today and your tests are completely normal, you will receive your results only by: Marland Kitchen MyChart Message (if you have MyChart) OR . A paper copy in the mail If you have any lab test that is abnormal or we need to change your treatment, we will call you to review the results.  Testing/Procedures: None   Follow-Up: At [facility], you and your health needs are our priority.  As part of our continuing mission to provide you with exceptional heart care, we have created designated Provider Care Teams.  These Care Teams include your primary Cardiologist (physician) and Advanced Practice Providers (APPs -  Physician Assistants and Nurse Practitioners) who all work together to provide you with the care you need, when you need it. You will need a follow up appointment in:  12 months.  Please call our office 2 months in advance to schedule this appointment.  You may see [name], MD or one of the following Advanced Practice Providers on your designated Care Team: Richardson Dopp, PA-C [place], [place] . Daune Perch, NP  Any Other Special Instructions Will Be Listed Below (If Applicable).

## 2019-01-05 NOTE — Progress Notes (Signed)
Electrophysiology Office Note   Date:  01/05/2019   ID:  [name], DOB 1945/07/29, MRN [id number]  PCP:  Biagio Borg, MD  Cardiologist:  Burt Knack Primary Electrophysiologist:  Dr Curt Bears    CC: Follow up for device check/new onset afib.   History of Present Illness: [name] is a 73 y.o. male who is being seen today for the evaluation of CHF at the request of Biagio Borg, MD. Presenting today for electrophysiology evaluation. He has a history of chronic systolic heart failure, coronary artery disease status post stenting of the RCA, left bundle branch block. He also has sleep apnea and uses a CPAP at night.  Medtronic CRT-D implanted 03/27/17.  Implant was comp gated by microperforation.  He was put on colchicine and that greatly improved his symptoms. He recently had paroxysmal atrial fibrillation noted on his remote transmission which lasted 14 hours.   Today, denies symptoms of palpitations, chest pain, shortness of breath, orthopnea, PND, lower extremity edema, claudication, dizziness, presyncope, syncope, bleeding, or neurologic sequela. The patient is tolerating medications without difficulties.  Overall he is doing well.  He has had some mild chest pain, 1 out of 10 on the left side of his chest.  There are no exacerbating or alleviating factors.  Lasts a minute or 2.  He has not tried any medications for this.  Past Medical History:  Diagnosis Date  . AICD (automatic cardioverter/defibrillator) present   . Allergic rhinitis   . Allergy   . Anginal pain ([place]) 05/26/14   chest pain after chasing dog  . CAD (coronary artery disease)    hx of stent- 2005 RCA  . Cancer ([place])    skin cancer  . Cataract    beginning stage both eyes  . CHF (congestive heart failure) (HCC)    pacemaker Medtronic    . Chronic back pain    intermittent  . Constipation   . Dizziness   . Dysrhythmia    right bundle branch block   . Esophageal stricture   . GERD (gastroesophageal  reflux disease)   . Hemoptysis   . Hiatal hernia   . History of colonic polyps    hyperplastic  . HTN (hypertension)   . Hyperlipidemia   . Hypertrophy of prostate with urinary obstruction and other lower urinary tract symptoms (LUTS)   . OA (osteoarthritis)   . OSA (obstructive sleep apnea)    cpap- 10   . 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 Pneumonia   . Pre-diabetes    no medications  . Shortness of breath    with exertion  . Sleep apnea    cpap  . Testicular hypofunction    Past Surgical History:  Procedure Laterality Date  . BIV ICD INSERTION CRT-D N/A 03/27/2017   Procedure: BIV ICD INSERTION CRT-D;  Surgeon: Constance Haw, MD;  Location: [place] [facility];  Service: Cardiovascular;  Laterality: N/A;  . CARDIAC CATHETERIZATION     '05, last 2009, showing patent RCA stent  . COLONOSCOPY  12/2007   HYPERPLASTIC POLYP  . COLONOSCOPY WITH PROPOFOL N/A 06/02/2014   Procedure: COLONOSCOPY WITH PROPOFOL;  Surgeon: [name], MD;  [facility];  Service: Endoscopy;  Laterality: N/A;  . CORONARY ANGIOPLASTY  08/2003  . ESOPHAGOGASTRODUODENOSCOPY (EGD) WITH PROPOFOL N/A 06/02/2014   Procedure: ESOPHAGOGASTRODUODENOSCOPY (EGD) WITH PROPOFOL;  Surgeon: [name], MD;  [facility];  Service: Endoscopy;  Laterality: N/A;  . hernia surgery x 3     Bilateral Inguinal, Umbicial  . INSERTION OF MESH N/A 03/20/2018   Procedure: INSERTION OF MESH;  Surgeon: Alphonsa Overall, MD;  Location: [place];  Service: General;  Laterality: N/A;  . IRRIGATION AND DEBRIDEMENT ABSCESS Left 08/14/2012   Procedure: IRRIGATION AND DEBRIDEMENT LEFT INGUINAL BOIL ;  Surgeon: Ailene Rud, MD;  [facility];  Service: Urology;  Laterality: Left;  . JOINT REPLACEMENT Right 2017  . KNEE ARTHROPLASTY    . LEFT HEART CATHETERIZATION WITH CORONARY ANGIOGRAM N/A 05/26/2014    Procedure: LEFT HEART CATHETERIZATION WITH CORONARY ANGIOGRAM;  Surgeon: Blane Ohara, MD;  [facility];  Service: Cardiovascular;  Laterality: N/A;  . LUMBAR LAMINECTOMY/DECOMPRESSION MICRODISCECTOMY Left 09/26/2014   Procedure: Lumbar Laminectomy for resection of synovial cyst  Lumbar five- sacral one left;  Surgeon: Kary Kos, MD;  Location: [place] [facility];  Service: Neurosurgery;  Laterality: Left;  . MOLE REMOVAL Left 03/20/2018   Procedure: MOLE REMOVAL;  Surgeon: Alphonsa Overall, MD;  Location: [place];  Service: General;  Laterality: Left;  . Oral surg to removed growth from ?sinus  10/2010   ?Dermoid removed by DrRiggs  . PACEMAKER INSERTION  04/03/2017  . POLYPECTOMY    . RCA stenting     '05 RCA  . right toe surgery  Right    Cyst   . RIGHT/LEFT HEART CATH AND CORONARY ANGIOGRAPHY N/A 01/15/2017   Procedure: RIGHT/LEFT HEART CATH AND CORONARY ANGIOGRAPHY;  Surgeon: [name], MD;  Location: [place] [facility];  Service: Cardiovascular;  Laterality: N/A;  . s/p right knee arthroscopy  2005  . thumb surgery Right   . TOTAL KNEE ARTHROPLASTY Right 03/12/2016   Procedure: RIGHT TOTAL KNEE ARTHROPLASTY;  Surgeon: Paralee Cancel, MD;  [facility];  Service: Orthopedics;  Laterality: Right;  . UPPER GASTROINTESTINAL ENDOSCOPY    . VENTRAL HERNIA REPAIR N/A 03/20/2018   Procedure: [place];  Surgeon: Alphonsa Overall, MD;  Location: [place];  Service: General;  Laterality: N/A;     Current Outpatient Medications  Medication Sig Dispense Refill  . atorvastatin (LIPITOR) 20 MG tablet TAKE 1 TABLET BY MOUTH DAILY 90 tablet 2  . b complex vitamins tablet Take 1 tablet by mouth daily with lunch.     . calcium carbonate (OSCAL) 1500 (600 Ca) MG TABS tablet Take 600 mg by mouth every evening.    . carvedilol (COREG) 25 MG tablet TAKE 1 TABLET BY MOUTH TWICE DAILY 180 tablet 2  . CINNAMON PO Take 1 tablet by mouth daily with lunch.     .  Coenzyme Q10 (CO Q 10) 100 MG CAPS Take 1 capsule by mouth daily with lunch.     . diclofenac sodium (VOLTAREN) 1 % GEL Apply 1 application topically 4 (four) times daily as needed (pain).   3  . ELIQUIS 5 MG TABS tablet TAKE 1 TABLET(5 MG) BY MOUTH TWICE DAILY 60 tablet 2  . ENTRESTO 49-51 MG TAKE 1 TABLET BY MOUTH 2 TIMES DAILY 295 tablet 3  . folic acid (FOLVITE) 188 MCG tablet Take 400 mcg by mouth daily.     . Glucosamine HCl (GLUCOSAMINE PO) Take 1,500 mg by mouth every evening.    . hydroxychloroquine (PLAQUENIL) 200 MG tablet TAKE 1 TABLET BY MOUTH TWICE DAILY MONDAY THROUGH FRIDAY ONLY. DO NOT TAKE SATURDAY OR SUNDAY 120 tablet 0  .  hydroxypropyl methylcellulose / hypromellose (ISOPTO TEARS / GONIOVISC) 2.5 % ophthalmic solution Place 1 drop into both eyes 4 (four) times daily as needed for dry eyes.     . Multiple Vitamin (MULTIVITAMIN) capsule Take 1 capsule by mouth daily.      . Multiple Vitamins-Minerals (OCUVITE PO) Take 1 tablet by mouth at bedtime.    . NON FORMULARY CPAP machine with sleep.    . pantoprazole (PROTONIX) 40 MG tablet TAKE 1 TABLET BY MOUTH EVERY DAY BEFORE BREAKFAST GENERIC EQUIVALENT FOR PROTONIX 90 tablet 3  . Probiotic Product (PROBIOTIC DAILY PO) Take 1 capsule by mouth daily with lunch.     . Saw Palmetto, Serenoa repens, (SAW PALMETTO PO) Take 1 capsule by mouth every evening.    Marland Kitchen spironolactone (ALDACTONE) 25 MG tablet TAKE 1/2 TABLET(12.5 MG) BY MOUTH DAILY 45 tablet 0  . tamsulosin (FLOMAX) 0.4 MG CAPS capsule Take 0.4 mg by mouth at bedtime.    . Testosterone 20 % CREA Apply 2 mLs topically daily. Rub on shoulder    . traMADol (ULTRAM) 50 MG tablet TAKE 1 TABLET BY MOUTH EVERY DAY AT BEDTIME AS NEEDED 30 tablet 0  . TURMERIC PO Take 1 tablet by mouth daily with lunch.      No current facility-administered medications for this visit.     Allergies:   Sulfonamide derivatives   Social History:  The patient  reports that he quit smoking about 28 years  ago. He has never used smokeless tobacco. He reports current alcohol use. He reports that he does not use drugs.   Family History:  The patient's family history includes Arthritis in his brother and daughter; COPD in his brother; Cancer in his brother; Coronary artery disease in his father; Diabetes in his father; Heart disease in his father and mother; Heart failure in his brother; Hypertension in an other family member; Stomach cancer in his paternal grandmother; Sudden death in his father.   ROS:  Please see the history of present illness.   Otherwise, review of systems is positive for none.   All other systems are reviewed and negative.   PHYSICAL EXAM: VS:  BP 120/76   Pulse 69   Ht 5\' 8"  (1.727 m)   Wt 190 lb 6.4 oz (86.4 kg)   SpO2 95%   BMI 28.95 kg/m  , BMI Body mass index is 28.95 kg/m. GEN: Well nourished, well developed, in no acute distress  HEENT: normal  Neck: no JVD, carotid bruits, or masses Cardiac: RRR; no murmurs, rubs, or gallops,no edema  Respiratory:  clear to auscultation bilaterally, normal work of breathing GI: soft, nontender, nondistended, + BS MS: no deformity or atrophy  Skin: warm and dry, device site well healed Neuro:  Strength and sensation are intact Psych: euthymic mood, full affect  EKG:  EKG is ordered today. Personal review of the ekg ordered shows atrial sensed, ventricular paced, PVC  Personal review of the device interrogation today. Results in [place]: 12/22/2018: ALT 18; BUN 15; Creat 0.92; Hemoglobin 15.8; Platelets 280; Potassium 4.4; Sodium 138    Lipid Panel     Component Value Date/Time   CHOL 138 11/27/2016 0914   TRIG 188 (H) 11/27/2016 0914   HDL 35 (L) 11/27/2016 0914   CHOLHDL 3.9 11/27/2016 0914   CHOLHDL 4 12/07/2015 0742   VLDL 30.6 12/07/2015 0742   LDLCALC 65 11/27/2016 0914     Wt Readings from Last 3 Encounters:  01/05/19 190 lb  6.4 oz (86.4 kg)  12/24/18 190 lb (86.2 kg)  12/22/18 190 lb 12.8  oz (86.5 kg)      Other studies Reviewed: Additional studies/ records that were reviewed today include: TTE 06/25/16 Review of the above records today demonstrates:  - Left ventricle: The cavity size was normal. Systolic function was   normal. The estimated ejection fraction was in the range of 55%   to 60%. Wall motion was normal; there were no regional wall   motion abnormalities. Doppler parameters are consistent with   abnormal left ventricular relaxation (grade 1 diastolic   dysfunction). - Ventricular septum: Septal motion showed dyssynergy. These   changes are consistent with a left bundle branch block. - Aortic valve: There was trivial regurgitation. - Atrial septum: There was increased thickness of the septum,   consistent with lipomatous hypertrophy.  LHC/RHC 01/05/17 1. Widely patent coronary arteries with continued patency of the stented segment in the right coronary artery 2. Mild nonobstructive coronary artery disease as detailed with mild stenosis of the proximal to mid LAD and otherwise minimal luminal irregularities 3. Well compensated right-sided cardiac hemodynamics  ASSESSMENT AND PLAN:  1.  Chronic systolic heart failure: Status post Medtronic CRT-D implanted 03/27/2017.  He is fortunately had improvement of his LV systolic function of 55 to 60%.  Device functioning appropriately.  No changes.    2. Paroxysmal atrial fibrillation: New diagnosis based on remote transmissions.  Currently on Eliquis.    This patients CHA2DS2-VASc Score and unadjusted Ischemic Stroke Rate (% per year) is equal to 4.8 % stroke rate/year from a score of 4  Above score calculated as 1 point each if present [CHF, HTN, DM, Vascular=MI/PAD/Aortic Plaque, Age if 65-74, or Male] Above score calculated as 2 points each if present [Age > 75, or Stroke/TIA/TE]   3. Hyperlipidemia: Continue statin  4. Hypertension: well controlled  5. Coronary artery disease, native vessel, without angina:  Has had mild chest pain does not associate with exertion.  This is quite atypical.  No further work-up at this time unless he has exertional angina.   Current medicines are reviewed at length with the patient today.   The patient does not have concerns regarding his medicines.  The following changes were made today: None  Labs/ tests ordered today include:  Orders Placed This Encounter  Procedures  . EKG 12-Lead     Disposition:   FU with   6 months  Signed,  [name], MD  01/05/2019 3:32 PM     [address] [place] [id number] [phone] (office) [phone] (fax)

## 2019-01-05 NOTE — Patient Instructions (Addendum)
Medication Instructions:  Your physician recommends that you continue on your current medications as directed. Please refer to the Current Medication list given to you today.  *If you need a refill on your cardiac medications before your next appointment, please call your pharmacy*  Labwork: None ordered  Testing/Procedures: None ordered  Follow-Up: Remote monitoring is used to monitor your Pacemaker or ICD from home. This monitoring reduces the number of office visits required to check your device to one time per year. It allows [place] to keep an eye on the functioning of your device to ensure it is working properly. You are scheduled for a device check from home on 01/06/19. You may send your transmission at any time that day. If you have a wireless device, the transmission will be sent automatically. After your physician reviews your transmission, you will receive a postcard with your next transmission date.  Your physician wants you to follow-up in: 6 months with Dr. Curt Bears.  You will receive a reminder letter in the mail two months in advance. If you don't receive a letter, please call our office to schedule the follow-up appointment.  Thank you for choosing [facility]!!   Trinidad Curet, RN [phone]  Any Other Special Instructions Will Be Listed Below (If Applicable).

## 2019-01-05 NOTE — Progress Notes (Addendum)
Cardiology Office Note:    Date:  01/05/2019   ID:  [name], DOB 10-09-1945, MRN [id number]  PCP:  Billy Borg, MD  Cardiologist:  Billy Mocha, MD  Electrophysiologist:  Billy Haw, MD   Referring MD: Billy Borg, MD   Chief Complaint  Patient presents with   Follow-up    CAD, CHF, AFib    History of Present Illness:    [name] is a 73 y.o. male with:  Chronic systolic CHF w/ recovered LVF  CPET 7/18: limitation 2/2 mild HF, obesity, restrictive lung dz  Echocardiogram 7/18: EF 30-35  S/p CRT-D in 2018  Echocardiogram 2/19: EF 55-60  CAD status post prior PCI to the RCA  Cath 9/18: RCA stent patent  LBBB  Paroxysmal atrial fibrillation   Apixaban therapy  Hypertension  Hyperlipidemia  OSA   [name] was last seen by [name] 07/08/2018.  Follow-up echocardiogram in February 2020 demonstrated that his LV function remains normal with mild diastolic dysfunction.  He returns for follow-up.  He is here alone.  He has been doing well.  He has an occasional ache in his chest from time to time.  This occurs at rest.  He is able to exert himself without chest symptoms.  He has not had any symptoms reminiscent of his previous angina.  He does get short of breath with certain types of activities.  This is unchanged.  He has not had orthopnea.  He has some mild edema at times.  He has not had syncope.  He saw [name] earlier today for follow-up on his device.  Prior CV studies:   The following studies were reviewed today:  Echo 07/15/2018 EF 55-60, moderate LVH, grade 1 diastolic dysfunction, normal RV SF  Echocardiogram 06/25/2017 EF 55-60, normal wall motion, grade 1 diastolic dysfunction, trivial AI, atrial septal lipomatous hypertrophy  R/L Cardiac Catheterization 02/15/17 LAD mid 30 LCx irregs RCA mid stent patent LVEDP 5, mean PCWP 6, mean PA 17  CPX 12/04/16 CPX testing reveals a low normal pVO2 in the setting of a mildly  submaximal test. The patient's limitation seems multifactorial due to a combination of mild HF, obesity and restrictive lung physiology. There are also frequent PVCs. Would recommend exercise training program and weight loss. Consider 48-hour Holter monitor to quantify PVC burden.  Echocardiogram 12/03/16 Mild conc LVH, EF 30-35, no RWMA, Gr 2 DD, trivial AI, mod to severe MR, mod LAE, normal RVSF, mild PI, PASP 36  Echo 03/08/15 Mild LVH, EF 35-40, Gr 1 DD, mild MR, mild LAE, normal RVSF, PASP 26  Cardiac MRI 08/07/11 1) Mild LVE with abnormal septal motion EF 48% 2) No infarct or scar on hyperenhancement images 3) Mild LAE 4) Normal right sided cardiac chambers  Past Medical History:  Diagnosis Date   AICD (automatic cardioverter/defibrillator) present    Allergic rhinitis    Allergy    Anginal pain ([place]) 05/26/14   chest pain after chasing dog   CAD (coronary artery disease)    hx of stent- 2005 RCA   Cancer ([place])    skin cancer   Cataract    beginning stage both eyes   CHF (congestive heart failure) (HCC)    pacemaker [employer]     Chronic back pain    intermittent   Constipation    Dizziness    Dysrhythmia    right bundle branch block    Esophageal stricture    GERD (gastroesophageal reflux disease)  Hemoptysis    Hiatal hernia    History of colonic polyps    hyperplastic   HTN (hypertension)    Hyperlipidemia    Hypertrophy of prostate with urinary obstruction and other lower urinary tract symptoms (LUTS)    OA (osteoarthritis)    OSA (obstructive sleep apnea)    cpap- 10    Other specified disorder of stomach and duodenum    duodenal periampulary tubulovillous adenoma removed by Dr. Ardis Hughs 5/10   Pneumonia    Pre-diabetes    no medications   Shortness of breath    with exertion   Sleep apnea    cpap   Testicular hypofunction    Surgical Hx: The patient  has a past surgical history that includes s/p right knee  arthroscopy (2005); RCA stenting; hernia surgery x 3; Oral surg to removed growth from ?sinus (10/2010); Colonoscopy (12/2007); Upper gastrointestinal endoscopy; right toe surgery  (Right); Irrigation and debridement abscess (Left, 08/14/2012); Cardiac catheterization; left heart catheterization with coronary angiogram (N/A, 05/26/2014); Colonoscopy with propofol (N/A, 06/02/2014); Esophagogastroduodenoscopy (egd) with propofol (N/A, 06/02/2014); Coronary angioplasty (08/2003); Lumbar laminectomy/decompression microdiscectomy (Left, 09/26/2014); Total knee arthroplasty (Right, 03/12/2016); RIGHT/LEFT HEART CATH AND CORONARY ANGIOGRAPHY (N/A, 01/15/2017); BIV ICD INSERTION CRT-D (N/A, 03/27/2017); Pacemaker insertion (04/03/2017); Joint replacement (Right, 2017); Knee Arthroplasty; Polypectomy; thumb surgery (Right); Ventral hernia repair (N/A, 03/20/2018); Insertion of mesh (N/A, 03/20/2018); and Mole removal (Left, 03/20/2018).   Current Medications: No outpatient medications have been marked as taking for the 01/05/19 encounter (Office Visit) with [name] T, PA-C.     Allergies:   Sulfonamide derivatives   Social History   Tobacco Use   Smoking status: Former Smoker    Quit date: 05/20/1990    Years since quitting: 28.6   Smokeless tobacco: Never Used   Tobacco comment: previous 30 pack year history  Substance Use Topics   Alcohol use: Yes    Comment: rare   Drug use: Never     Family Hx: The patient's family history includes Arthritis in his brother and daughter; COPD in his brother; Cancer in his brother; Coronary artery disease in his father; Diabetes in his father; Heart disease in his father and mother; Heart failure in his brother; Hypertension in an other family member; Stomach cancer in his paternal grandmother; Sudden death in his father. There is no history of Colon cancer, Esophageal cancer, Colon polyps, or Ulcerative colitis.  ROS:   Please see the history of present illness.      ROS All other systems reviewed and are negative.   EKGs/Labs/Other Test Reviewed:    EKG:  EKG is not ordered today.  The ekg ordered today demonstrates n/a  Recent Labs: 12/22/2018: ALT 18; BUN 15; Creat 0.92; Hemoglobin 15.8; Platelets 280; Potassium 4.4; Sodium 138   Recent Lipid Panel Lab Results  Component Value Date/Time   CHOL 138 11/27/2016 09:14 AM   TRIG 188 (H) 11/27/2016 09:14 AM   HDL 35 (L) 11/27/2016 09:14 AM   CHOLHDL 3.9 11/27/2016 09:14 AM   CHOLHDL 4 12/07/2015 07:42 AM   LDLCALC 65 11/27/2016 09:14 AM    Physical Exam:    VS:  BP 120/76    Pulse 69    Ht 5\' 8"  (1.727 m)    Wt 190 lb (86.2 kg)    SpO2 95%    BMI 28.89 kg/m     Wt Readings from Last 3 Encounters:  01/05/19 190 lb (86.2 kg)  01/05/19 190 lb 6.4 oz (86.4 kg)  12/24/18 190 lb (86.2 kg)     Physical Exam  Constitutional: He is oriented to person, place, and time. He appears well-developed and well-nourished. No distress.  HENT:  Head: Normocephalic and atraumatic.  Eyes: No scleral icterus.  Neck: No JVD present. No thyromegaly present.  Cardiovascular: Normal rate, regular rhythm and normal heart sounds.  No murmur heard. Pulmonary/Chest: Effort normal and breath sounds normal. He has no rales.  Abdominal: Soft. There is no hepatomegaly.  Musculoskeletal:        General: No edema.  Lymphadenopathy:    He has no cervical adenopathy.  Neurological: He is alert and oriented to person, place, and time.  Skin: Skin is warm and dry.  Psychiatric: He has a normal mood and affect.    ASSESSMENT & PLAN:    1. Coronary artery disease involving native coronary artery of native heart without angina pectoris History of prior stenting to the RCA.  Cardiac catheterization in 2018 demonstrated a patent stent to the RCA.  He has occasional chest discomfort.  However, this is not typical for ischemia.  He is not having exertional symptoms or symptoms reminiscent of his previous angina.  I have asked  him to continue to monitor his symptoms and let us know if they change.  Continue atorvastatin, carvedilol.  He is not on aspirin as he is on Apixaban.  2. Chronic systolic (congestive) heart failure ([place]) He had recovery of his LV function after implantation of CRT-D.  His EF is now normal.  Volume status is stable.  He denies any fever.  Continue beta-blocker, angiotensin receptor neprilysin inhibitor, spironolactone.  3. PAF (paroxysmal atrial fibrillation) ([place]) He is tolerating anticoagulation well.  Recent creatinine and hemoglobin normal.  Continue Apixaban 5 mg twice daily.  4. Essential hypertension The patient's blood pressure is controlled on his current regimen.  Continue current therapy.   5. Hyperlipidemia, unspecified hyperlipidemia type Continue statin therapy.  Arrange follow-up fasting lipids.  6. Biventricular ICD (implantable cardioverter-defibrillator) in place Continue follow-up with EP as planned.   Dispo:  Return in about 6 months (around 07/08/2019) for Routine Follow Up, w/ [name], or Billy Dopp, PA-C.   Medication Adjustments/Labs and Tests Ordered: Current medicines are reviewed at length with the patient today.  Concerns regarding medicines are outlined above.  Tests Ordered: Orders Placed This Encounter  Procedures   Lipid panel   Medication Changes: No orders of the defined types were placed in this encounter.   Signed, Billy Dopp, PA-C  01/05/2019 5:08 PM    [facility] [address], [place], [place]  [address] Phone: [phone]; Fax: [phone]

## 2019-01-06 ENCOUNTER — Ambulatory Visit (INDEPENDENT_AMBULATORY_CARE_PROVIDER_SITE_OTHER): Payer: Medicare Other | Admitting: *Deleted

## 2019-01-06 DIAGNOSIS — R3915 Urgency of urination: Secondary | ICD-10-CM | POA: Diagnosis not present

## 2019-01-06 DIAGNOSIS — E291 Testicular hypofunction: Secondary | ICD-10-CM | POA: Diagnosis not present

## 2019-01-06 DIAGNOSIS — N401 Enlarged prostate with lower urinary tract symptoms: Secondary | ICD-10-CM | POA: Diagnosis not present

## 2019-01-06 DIAGNOSIS — I255 Ischemic cardiomyopathy: Secondary | ICD-10-CM

## 2019-01-06 LAB — CUP PACEART REMOTE DEVICE CHECK
Battery Remaining Longevity: 82 mo
Battery Voltage: 2.97 V
Brady Statistic AP VP Percent: 0.4 %
Brady Statistic AP VS Percent: 0.01 %
Brady Statistic AS VP Percent: 96.39 %
Brady Statistic AS VS Percent: 3.21 %
Brady Statistic RA Percent Paced: 0.41 %
Brady Statistic RV Percent Paced: 35.16 %
Date Time Interrogation Session: 20200819041604
HighPow Impedance: 69 Ohm
Implantable Lead Implant Date: 20181108
Implantable Lead Implant Date: 20181108
Implantable Lead Implant Date: 20181108
Implantable Lead Location: 753858
Implantable Lead Location: 753859
Implantable Lead Location: 753860
Implantable Lead Model: 4298
Implantable Lead Model: 5076
Implantable Pulse Generator Implant Date: 20181108
Lead Channel Impedance Value: 1083 Ohm
Lead Channel Impedance Value: 1083 Ohm
Lead Channel Impedance Value: 1140 Ohm
Lead Channel Impedance Value: 212.8 Ohm
Lead Channel Impedance Value: 224.438
Lead Channel Impedance Value: 263.855
Lead Channel Impedance Value: 287.631
Lead Channel Impedance Value: 309.309
Lead Channel Impedance Value: 399 Ohm
Lead Channel Impedance Value: 418 Ohm
Lead Channel Impedance Value: 456 Ohm
Lead Channel Impedance Value: 513 Ohm
Lead Channel Impedance Value: 532 Ohm
Lead Channel Impedance Value: 551 Ohm
Lead Channel Impedance Value: 646 Ohm
Lead Channel Impedance Value: 703 Ohm
Lead Channel Impedance Value: 760 Ohm
Lead Channel Impedance Value: 779 Ohm
Lead Channel Pacing Threshold Amplitude: 0.375 V
Lead Channel Pacing Threshold Amplitude: 0.5 V
Lead Channel Pacing Threshold Amplitude: 2.5 V
Lead Channel Pacing Threshold Pulse Width: 0.4 ms
Lead Channel Pacing Threshold Pulse Width: 0.4 ms
Lead Channel Pacing Threshold Pulse Width: 1 ms
Lead Channel Sensing Intrinsic Amplitude: 12.75 mV
Lead Channel Sensing Intrinsic Amplitude: 12.75 mV
Lead Channel Sensing Intrinsic Amplitude: 3 mV
Lead Channel Sensing Intrinsic Amplitude: 3.25 mV
Lead Channel Setting Pacing Amplitude: 2 V
Lead Channel Setting Pacing Amplitude: 2.5 V
Lead Channel Setting Pacing Amplitude: 2.5 V
Lead Channel Setting Pacing Pulse Width: 0.4 ms
Lead Channel Setting Pacing Pulse Width: 1 ms
Lead Channel Setting Sensing Sensitivity: 0.3 mV

## 2019-01-14 ENCOUNTER — Other Ambulatory Visit: Payer: Self-pay | Admitting: Rheumatology

## 2019-01-14 ENCOUNTER — Encounter: Payer: Self-pay | Admitting: Cardiology

## 2019-01-14 DIAGNOSIS — L919 Hypertrophic disorder of the skin, unspecified: Secondary | ICD-10-CM | POA: Diagnosis not present

## 2019-01-14 DIAGNOSIS — L812 Freckles: Secondary | ICD-10-CM | POA: Diagnosis not present

## 2019-01-14 DIAGNOSIS — L821 Other seborrheic keratosis: Secondary | ICD-10-CM | POA: Diagnosis not present

## 2019-01-14 DIAGNOSIS — L218 Other seborrheic dermatitis: Secondary | ICD-10-CM | POA: Diagnosis not present

## 2019-01-14 DIAGNOSIS — D1801 Hemangioma of skin and subcutaneous tissue: Secondary | ICD-10-CM | POA: Diagnosis not present

## 2019-01-14 DIAGNOSIS — D235 Other benign neoplasm of skin of trunk: Secondary | ICD-10-CM | POA: Diagnosis not present

## 2019-01-14 DIAGNOSIS — L820 Inflamed seborrheic keratosis: Secondary | ICD-10-CM | POA: Diagnosis not present

## 2019-01-14 DIAGNOSIS — L570 Actinic keratosis: Secondary | ICD-10-CM | POA: Diagnosis not present

## 2019-01-14 NOTE — Telephone Encounter (Signed)
Last Visit: 12/22/18 Next Visit: 05/26/19 UDS: 12/22/18 Narc Agreement: 12/22/18  Okay to refill Tramadol?

## 2019-01-14 NOTE — Progress Notes (Signed)
Remote ICD transmission.   

## 2019-01-24 ENCOUNTER — Other Ambulatory Visit: Payer: Self-pay

## 2019-01-24 ENCOUNTER — Ambulatory Visit (HOSPITAL_COMMUNITY)
Admission: EM | Disposition: A | Discharge status: Home / Self Care | Payer: Self-pay | Source: Home / Self Care | Attending: Emergency Medicine

## 2019-01-24 ENCOUNTER — Emergency Department (HOSPITAL_COMMUNITY): Payer: Medicare Other

## 2019-01-24 ENCOUNTER — Observation Stay (HOSPITAL_COMMUNITY)
Admission: EM | Admit: 2019-01-24 | Discharge: 2019-01-25 | Disposition: A | Discharge status: Home / Self Care | Payer: Medicare Other | Attending: Cardiology | Admitting: Cardiology

## 2019-01-24 ENCOUNTER — Encounter (HOSPITAL_COMMUNITY): Payer: Self-pay | Admitting: Emergency Medicine

## 2019-01-24 ENCOUNTER — Inpatient Hospital Stay (HOSPITAL_COMMUNITY): Payer: Medicare Other

## 2019-01-24 ENCOUNTER — Telehealth: Payer: Self-pay | Admitting: Cardiology

## 2019-01-24 DIAGNOSIS — I25119 Atherosclerotic heart disease of native coronary artery with unspecified angina pectoris: Secondary | ICD-10-CM | POA: Diagnosis not present

## 2019-01-24 DIAGNOSIS — Z950 Presence of cardiac pacemaker: Secondary | ICD-10-CM | POA: Diagnosis not present

## 2019-01-24 DIAGNOSIS — Z882 Allergy status to sulfonamides status: Secondary | ICD-10-CM | POA: Insufficient documentation

## 2019-01-24 DIAGNOSIS — M069 Rheumatoid arthritis, unspecified: Secondary | ICD-10-CM | POA: Diagnosis not present

## 2019-01-24 DIAGNOSIS — R079 Chest pain, unspecified: Secondary | ICD-10-CM

## 2019-01-24 DIAGNOSIS — G4733 Obstructive sleep apnea (adult) (pediatric): Secondary | ICD-10-CM | POA: Diagnosis present

## 2019-01-24 DIAGNOSIS — I5022 Chronic systolic (congestive) heart failure: Secondary | ICD-10-CM | POA: Diagnosis present

## 2019-01-24 DIAGNOSIS — I480 Paroxysmal atrial fibrillation: Secondary | ICD-10-CM | POA: Diagnosis not present

## 2019-01-24 DIAGNOSIS — Z791 Long term (current) use of non-steroidal anti-inflammatories (NSAID): Secondary | ICD-10-CM | POA: Insufficient documentation

## 2019-01-24 DIAGNOSIS — E785 Hyperlipidemia, unspecified: Secondary | ICD-10-CM | POA: Diagnosis not present

## 2019-01-24 DIAGNOSIS — Z20828 Contact with and (suspected) exposure to other viral communicable diseases: Secondary | ICD-10-CM | POA: Diagnosis not present

## 2019-01-24 DIAGNOSIS — Z9989 Dependence on other enabling machines and devices: Secondary | ICD-10-CM | POA: Diagnosis present

## 2019-01-24 DIAGNOSIS — Z79899 Other long term (current) drug therapy: Secondary | ICD-10-CM | POA: Insufficient documentation

## 2019-01-24 DIAGNOSIS — I213 ST elevation (STEMI) myocardial infarction of unspecified site: Secondary | ICD-10-CM | POA: Diagnosis present

## 2019-01-24 DIAGNOSIS — I110 Hypertensive heart disease with heart failure: Secondary | ICD-10-CM | POA: Insufficient documentation

## 2019-01-24 DIAGNOSIS — I2510 Atherosclerotic heart disease of native coronary artery without angina pectoris: Secondary | ICD-10-CM | POA: Diagnosis present

## 2019-01-24 DIAGNOSIS — I10 Essential (primary) hypertension: Secondary | ICD-10-CM | POA: Diagnosis present

## 2019-01-24 DIAGNOSIS — K219 Gastro-esophageal reflux disease without esophagitis: Secondary | ICD-10-CM | POA: Diagnosis not present

## 2019-01-24 DIAGNOSIS — Z7901 Long term (current) use of anticoagulants: Secondary | ICD-10-CM | POA: Insufficient documentation

## 2019-01-24 DIAGNOSIS — R0789 Other chest pain: Secondary | ICD-10-CM | POA: Diagnosis present

## 2019-01-24 HISTORY — PX: LEFT HEART CATH AND CORONARY ANGIOGRAPHY: CATH118249

## 2019-01-24 LAB — CBC
HCT: 44.6 % (ref 39.0–52.0)
Hemoglobin: 14.6 g/dL (ref 13.0–17.0)
MCH: 30 pg (ref 26.0–34.0)
MCHC: 32.7 g/dL (ref 30.0–36.0)
MCV: 91.8 fL (ref 80.0–100.0)
Platelets: 250 10*3/uL (ref 150–400)
RBC: 4.86 MIL/uL (ref 4.22–5.81)
RDW: 13.3 % (ref 11.5–15.5)
WBC: 9.5 10*3/uL (ref 4.0–10.5)
nRBC: 0 % (ref 0.0–0.2)

## 2019-01-24 LAB — BASIC METABOLIC PANEL
Anion gap: 9 (ref 5–15)
BUN: 14 mg/dL (ref 8–23)
CO2: 23 mmol/L (ref 22–32)
Calcium: 9.6 mg/dL (ref 8.9–10.3)
Chloride: 104 mmol/L (ref 98–111)
Creatinine, Ser: 0.89 mg/dL (ref 0.61–1.24)
GFR calc Af Amer: >60 mL/min (ref >60)
GFR calc non Af Amer: >60 mL/min (ref >60)
Glucose, Bld: 146 mg/dL — ABNORMAL HIGH (ref 70–99)
Potassium: 3.9 mmol/L (ref 3.5–5.1)
Sodium: 136 mmol/L (ref 135–145)

## 2019-01-24 LAB — COMPREHENSIVE METABOLIC PANEL
ALT: 20 U/L (ref 0–44)
AST: 24 U/L (ref 15–41)
Albumin: 3.7 g/dL (ref 3.5–5.0)
Alkaline Phosphatase: 62 U/L (ref 38–126)
Anion gap: 11 (ref 5–15)
BUN: 14 mg/dL (ref 8–23)
CO2: 21 mmol/L — ABNORMAL LOW (ref 22–32)
Calcium: 9.4 mg/dL (ref 8.9–10.3)
Chloride: 104 mmol/L (ref 98–111)
Creatinine, Ser: 0.84 mg/dL (ref 0.61–1.24)
GFR calc Af Amer: >60 mL/min (ref >60)
GFR calc non Af Amer: >60 mL/min (ref >60)
Glucose, Bld: 142 mg/dL — ABNORMAL HIGH (ref 70–99)
Potassium: 3.9 mmol/L (ref 3.5–5.1)
Sodium: 136 mmol/L (ref 135–145)
Total Bilirubin: 1.5 mg/dL — ABNORMAL HIGH (ref 0.3–1.2)
Total Protein: 6.5 g/dL (ref 6.5–8.1)

## 2019-01-24 LAB — LIPID PANEL
Cholesterol: 97 mg/dL (ref 0–200)
HDL: 27 mg/dL — ABNORMAL LOW (ref >40)
LDL Cholesterol: 38 mg/dL (ref 0–99)
Total CHOL/HDL Ratio: 3.6 RATIO
Triglycerides: 161 mg/dL — ABNORMAL HIGH (ref <150)
VLDL: 32 mg/dL (ref 0–40)

## 2019-01-24 LAB — TROPONIN I (HIGH SENSITIVITY)
Troponin I (High Sensitivity): 7 ng/L (ref <18)
Troponin I (High Sensitivity): 8 ng/L (ref <18)
Troponin I (High Sensitivity): 5 ng/L (ref <18)

## 2019-01-24 LAB — APTT: aPTT: 39 seconds — ABNORMAL HIGH (ref 24–36)

## 2019-01-24 LAB — SARS CORONAVIRUS 2 BY RT PCR (HOSPITAL ORDER, PERFORMED IN ~~LOC~~ HOSPITAL LAB): SARS Coronavirus 2: NEGATIVE

## 2019-01-24 LAB — BRAIN NATRIURETIC PEPTIDE: B Natriuretic Peptide: 40.2 pg/mL (ref 0.0–100.0)

## 2019-01-24 LAB — TSH: TSH: 2.402 u[IU]/mL (ref 0.350–4.500)

## 2019-01-24 LAB — PROTIME-INR
INR: 1.3 — ABNORMAL HIGH (ref 0.8–1.2)
Prothrombin Time: 16.2 seconds — ABNORMAL HIGH (ref 11.4–15.2)

## 2019-01-24 LAB — ECHOCARDIOGRAM COMPLETE
Height: 67 in
Weight: 2966.4 oz

## 2019-01-24 SURGERY — LEFT HEART CATH AND CORONARY ANGIOGRAPHY
Anesthesia: LOCAL

## 2019-01-24 MED ORDER — LIDOCAINE HCL (PF) 1 % IJ SOLN
INTRAMUSCULAR | Status: AC
  Filled 2019-01-24: qty 30

## 2019-01-24 MED ORDER — ATORVASTATIN CALCIUM 10 MG PO TABS
20.0000 mg | ORAL_TABLET | Freq: Every day | ORAL | Status: DC
  Administered 2019-01-25: 20 mg via ORAL
  Filled 2019-01-24: qty 2

## 2019-01-24 MED ORDER — HYPROMELLOSE (GONIOSCOPIC) 2.5 % OP SOLN
1.0000 [drp] | Freq: Four times a day (QID) | OPHTHALMIC | Status: DC | PRN
  Filled 2019-01-24: qty 15

## 2019-01-24 MED ORDER — COLCHICINE 0.6 MG PO TABS
0.6000 mg | ORAL_TABLET | Freq: Two times a day (BID) | ORAL | Status: DC
  Administered 2019-01-24 (×2): 0.6 mg via ORAL
  Filled 2019-01-24 (×2): qty 1

## 2019-01-24 MED ORDER — TRAMADOL HCL 50 MG PO TABS
50.0000 mg | ORAL_TABLET | Freq: Every evening | ORAL | Status: DC | PRN
  Filled 2019-01-24: qty 1

## 2019-01-24 MED ORDER — APIXABAN 5 MG PO TABS
5.0000 mg | ORAL_TABLET | Freq: Two times a day (BID) | ORAL | Status: DC
  Administered 2019-01-25: 5 mg via ORAL
  Filled 2019-01-24: qty 1

## 2019-01-24 MED ORDER — CALCIUM CARBONATE 1500 (600 CA) MG PO TABS: 1500.0000 mg | ORAL_TABLET | Freq: Every evening | ORAL | Status: DC

## 2019-01-24 MED ORDER — HEPARIN (PORCINE) IN NACL 1000-0.9 UT/500ML-% IV SOLN
INTRAVENOUS | Status: AC
  Filled 2019-01-24: qty 1000

## 2019-01-24 MED ORDER — DICLOFENAC SODIUM 1 % TD GEL
1.0000 "application " | Freq: Four times a day (QID) | TRANSDERMAL | Status: DC | PRN
  Filled 2019-01-24: qty 100

## 2019-01-24 MED ORDER — FOLIC ACID 1 MG PO TABS
400.0000 ug | ORAL_TABLET | Freq: Every day | ORAL | Status: DC
  Filled 2019-01-24: qty 1

## 2019-01-24 MED ORDER — HEPARIN SODIUM (PORCINE) 1000 UNIT/ML IJ SOLN
INTRAMUSCULAR | Status: DC | PRN
  Administered 2019-01-24: 5000 [IU] via INTRAVENOUS

## 2019-01-24 MED ORDER — ACETAMINOPHEN 325 MG PO TABS
650.0000 mg | ORAL_TABLET | ORAL | Status: DC | PRN
  Filled 2019-01-24: qty 2

## 2019-01-24 MED ORDER — ZOLPIDEM TARTRATE 5 MG PO TABS
5.0000 mg | ORAL_TABLET | Freq: Every evening | ORAL | Status: DC | PRN
  Administered 2019-01-24: 5 mg via ORAL
  Filled 2019-01-24: qty 1

## 2019-01-24 MED ORDER — HEPARIN (PORCINE) IN NACL 1000-0.9 UT/500ML-% IV SOLN
INTRAVENOUS | Status: DC | PRN
  Administered 2019-01-24 (×2): 500 mL

## 2019-01-24 MED ORDER — CO Q 10 100 MG PO CAPS: 1.0000 | ORAL_CAPSULE | Freq: Every day | ORAL | Status: DC

## 2019-01-24 MED ORDER — CARVEDILOL 25 MG PO TABS
25.0000 mg | ORAL_TABLET | Freq: Two times a day (BID) | ORAL | Status: DC
  Administered 2019-01-24 – 2019-01-25 (×2): 25 mg via ORAL
  Filled 2019-01-24 (×2): qty 1

## 2019-01-24 MED ORDER — TAMSULOSIN HCL 0.4 MG PO CAPS
0.4000 mg | ORAL_CAPSULE | Freq: Every day | ORAL | Status: DC
  Administered 2019-01-24: 0.4 mg via ORAL
  Filled 2019-01-24: qty 1

## 2019-01-24 MED ORDER — VERAPAMIL HCL 2.5 MG/ML IV SOLN
INTRAVENOUS | Status: DC | PRN
  Administered 2019-01-24: 08:00:00 10 mL via INTRA_ARTERIAL

## 2019-01-24 MED ORDER — SODIUM CHLORIDE 0.9% FLUSH: 3.0000 mL | INTRAVENOUS | Status: DC | PRN

## 2019-01-24 MED ORDER — IOHEXOL 350 MG/ML SOLN
INTRAVENOUS | Status: DC | PRN
  Administered 2019-01-24: 65 mL via INTRA_ARTERIAL

## 2019-01-24 MED ORDER — LIDOCAINE HCL (PF) 1 % IJ SOLN
INTRAMUSCULAR | Status: DC | PRN
  Administered 2019-01-24: 2 mL via INTRADERMAL

## 2019-01-24 MED ORDER — PANTOPRAZOLE SODIUM 40 MG PO TBEC
40.0000 mg | DELAYED_RELEASE_TABLET | Freq: Every day | ORAL | Status: DC
  Administered 2019-01-25: 40 mg via ORAL
  Filled 2019-01-24: qty 1

## 2019-01-24 MED ORDER — HYDROXYCHLOROQUINE SULFATE 200 MG PO TABS
200.0000 mg | ORAL_TABLET | Freq: Two times a day (BID) | ORAL | Status: DC
  Administered 2019-01-24 – 2019-01-25 (×2): 200 mg via ORAL
  Filled 2019-01-24 (×2): qty 1

## 2019-01-24 MED ORDER — ALPRAZOLAM 0.25 MG PO TABS: 0.2500 mg | ORAL_TABLET | Freq: Two times a day (BID) | ORAL | Status: DC | PRN

## 2019-01-24 MED ORDER — SODIUM CHLORIDE 0.9% FLUSH
3.0000 mL | Freq: Two times a day (BID) | INTRAVENOUS | Status: DC
  Administered 2019-01-24: 3 mL via INTRAVENOUS

## 2019-01-24 MED ORDER — SPIRONOLACTONE 12.5 MG HALF TABLET
12.5000 mg | ORAL_TABLET | Freq: Every day | ORAL | Status: DC
  Administered 2019-01-25: 12.5 mg via ORAL
  Filled 2019-01-24: qty 1

## 2019-01-24 MED ORDER — NITROGLYCERIN 0.4 MG SL SUBL: 0.4000 mg | SUBLINGUAL_TABLET | SUBLINGUAL | Status: DC | PRN

## 2019-01-24 MED ORDER — PROSIGHT PO TABS
1.0000 | ORAL_TABLET | Freq: Every day | ORAL | Status: DC
  Administered 2019-01-25: 1 via ORAL
  Filled 2019-01-24: qty 1

## 2019-01-24 MED ORDER — SACUBITRIL-VALSARTAN 49-51 MG PO TABS
1.0000 | ORAL_TABLET | Freq: Two times a day (BID) | ORAL | Status: DC
  Administered 2019-01-24 – 2019-01-25 (×2): 1 via ORAL
  Filled 2019-01-24 (×2): qty 1

## 2019-01-24 MED ORDER — ONDANSETRON HCL 4 MG/2ML IJ SOLN: 4.0000 mg | Freq: Four times a day (QID) | INTRAMUSCULAR | Status: DC | PRN

## 2019-01-24 MED ORDER — SODIUM CHLORIDE 0.9 % IV SOLN
INTRAVENOUS | Status: DC
  Administered 2019-01-24: 08:00:00 via INTRAVENOUS

## 2019-01-24 MED ORDER — ASPIRIN 81 MG PO CHEW
324.0000 mg | CHEWABLE_TABLET | Freq: Once | ORAL | Status: AC
  Administered 2019-01-24: 324 mg via ORAL
  Filled 2019-01-24: qty 4

## 2019-01-24 MED ORDER — MULTIVITAMINS PO CAPS: 1.0000 | ORAL_CAPSULE | Freq: Every day | ORAL | Status: DC

## 2019-01-24 MED ORDER — APIXABAN 5 MG PO TABS: 5.0000 mg | ORAL_TABLET | Freq: Two times a day (BID) | ORAL | Status: DC

## 2019-01-24 MED ORDER — VERAPAMIL HCL 2.5 MG/ML IV SOLN
INTRAVENOUS | Status: AC
  Filled 2019-01-24: qty 2

## 2019-01-24 MED ORDER — SODIUM CHLORIDE 0.9 % WEIGHT BASED INFUSION
1.0000 mL/kg/h | INTRAVENOUS | Status: AC
  Administered 2019-01-24: 1 mL/kg/h via INTRAVENOUS

## 2019-01-24 MED ORDER — NITROGLYCERIN 1 MG/10 ML FOR IR/CATH LAB
INTRA_ARTERIAL | Status: AC
  Filled 2019-01-24: qty 10

## 2019-01-24 MED ORDER — SODIUM CHLORIDE 0.9 % IV SOLN: 250.0000 mL | INTRAVENOUS | Status: DC | PRN

## 2019-01-24 SURGICAL SUPPLY — 10 items
CATH 5FR JL3.5 JR4 ANG PIG MP (CATHETERS) ×1 IMPLANT
DEVICE RAD COMP TR BAND LRG (VASCULAR PRODUCTS) ×1 IMPLANT
GLIDESHEATH SLEND SS 6F .021 (SHEATH) ×1 IMPLANT
GUIDEWIRE INQWIRE 1.5J.035X260 (WIRE) IMPLANT
INQWIRE 1.5J .035X260CM (WIRE) ×2
KIT ENCORE 26 ADVANTAGE (KITS) ×1 IMPLANT
KIT HEART LEFT (KITS) ×2 IMPLANT
PACK CARDIAC CATHETERIZATION (CUSTOM PROCEDURE TRAY) ×2 IMPLANT
TRANSDUCER W/STOPCOCK (MISCELLANEOUS) ×2 IMPLANT
TUBING CIL FLEX 10 FLL-RA (TUBING) ×2 IMPLANT

## 2019-01-24 NOTE — ED Provider Notes (Signed)
[facility] EMERGENCY DEPARTMENT Provider Note   CSN: [id number] Arrival date & time: 01/24/19  7654     History   Chief Complaint Chief Complaint  Patient presents with  . Chest Pain    HPI [name] is a 73 y.o. male.     HPI  73 year old male history of ischemic cardiomyopathy, coronary disease, status post presents today complaining of chest pain for the past 2 weeks which worsened last night at 10 PM.  He describes it as substernal in nature and heavy.  It was 8 out of 10 at the worst.  He has increased shortness of breath and pain laying on his back.  He describes the pain is radiating through to his shoulder blades.  It is worse with breathing.  He has had a stent placed in the past but has denies history of MI.  He states that he previously had some similar pain after having a pacemaker placed in 2018.  At that time it was thought that he had some pericarditis and he was placed on medicine for several days which resolved the symptoms.  He denies any fever, sore throat, change in cough, nausea, vomiting, diarrhea, fever, chills, or exposure to COVID-19.  He has not taken anything for the pain.  He took his usual medications this morning including his Eliquis between 6 and 630  Past Medical History:  Diagnosis Date  . AICD (automatic cardioverter/defibrillator) present   . Allergic rhinitis   . Allergy   . Anginal pain ([place]) 05/26/14   chest pain after chasing dog  . CAD (coronary artery disease)    hx of stent- 2005 RCA  . Cancer ([place])    skin cancer  . Cataract    beginning stage both eyes  . CHF (congestive heart failure) (HCC)    pacemaker [employer]    . Chronic back pain    intermittent  . Constipation   . Dizziness   . Dysrhythmia    right bundle branch block   . Esophageal stricture   . GERD (gastroesophageal reflux disease)   . Hemoptysis   . Hiatal hernia   . History of colonic polyps    hyperplastic  . HTN (hypertension)   .  Hyperlipidemia   . Hypertrophy of prostate with urinary obstruction and other lower urinary tract symptoms (LUTS)   . OA (osteoarthritis)   . OSA (obstructive sleep apnea)    cpap- 10   . 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 Pneumonia   . Pre-diabetes    no medications  . Shortness of breath    with exertion  . Sleep apnea    cpap  . Testicular hypofunction     Patient Active Problem List   Diagnosis Date Noted  . Hyperglycemia 12/24/2018  . Rheumatoid arthritis ([place]) 12/24/2018  . Preventative health care 06/25/2018  . Incarcerated ventral hernia 03/20/2018  . DCM (dilated cardiomyopathy) ([place]) 02/11/2017  . Chronic systolic heart failure ([place]) 01/15/2017  . History of pneumonia 10/23/2016  . Osteoarthritis 10/23/2016  . Primary osteoarthritis of both feet 10/04/2016  . Primary osteoarthritis of left knee 10/04/2016  . DDD (degenerative disc disease), lumbar 10/04/2016  . Hemoptysis 05/27/2016  . Overweight (BMI 25.0-29.9) 03/14/2016  . 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  . Parotid mass 08/23/2015  . Dyspnea 11/29/2014  . Synovial cyst of lumbar facet joint 09/26/2014  . Incisional hernia, without obstruction  or gangrene 12/06/2013  . Nausea alone 08/27/2013  . Colon cancer screening 08/27/2013  . 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 Benign prostatic hyperplasia with urinary obstruction 06/18/2010  . Other specified disorder of stomach and duodenum 12/16/2008  . 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 GERD 09/02/2008  . COLONIC POLYPS, HYPERPLASTIC 05/24/2007  . Obstructive sleep apnea 05/24/2007  . ALLERGIC RHINITIS 05/24/2007  . ESOPHAGEAL STRICTURE 05/24/2007  . HIATAL HERNIA 05/24/2007  . Hyperlipidemia 02/06/2007  . Essential hypertension 02/06/2007  . CAD (coronary artery disease) 02/06/2007  . Primary osteoarthritis of both hands 02/06/2007  . Neck pain on  right side 02/06/2007    Past Surgical History:  Procedure Laterality Date  . BIV ICD INSERTION CRT-D N/A 03/27/2017   Procedure: BIV ICD INSERTION CRT-D;  Surgeon: Constance Haw, MD;  Location: [facility];  Service: Cardiovascular;  Laterality: N/A;  . CARDIAC CATHETERIZATION     '05, last 2009, showing patent RCA stent  . COLONOSCOPY  12/2007   HYPERPLASTIC POLYP  . COLONOSCOPY WITH PROPOFOL N/A 06/02/2014   Procedure: COLONOSCOPY WITH PROPOFOL;  Surgeon: [name], MD;  Location: [facility];  Service: Endoscopy;  Laterality: N/A;  . CORONARY ANGIOPLASTY  08/2003  . ESOPHAGOGASTRODUODENOSCOPY (EGD) WITH PROPOFOL N/A 06/02/2014   Procedure: ESOPHAGOGASTRODUODENOSCOPY (EGD) WITH PROPOFOL;  Surgeon: [name], MD;  Location: [facility];  Service: Endoscopy;  Laterality: N/A;  . hernia surgery x 3     Bilateral Inguinal, Umbicial  . INSERTION OF MESH N/A 03/20/2018   Procedure: INSERTION OF MESH;  Surgeon: Alphonsa Overall, MD;  Location: [facility];  Service: General;  Laterality: N/A;  . IRRIGATION AND DEBRIDEMENT ABSCESS Left 08/14/2012   Procedure: IRRIGATION AND DEBRIDEMENT LEFT INGUINAL BOIL ;  Surgeon: Ailene Rud, MD;  Location: [facility];  Service: Urology;  Laterality: Left;  . JOINT REPLACEMENT Right 2017  . KNEE ARTHROPLASTY    . LEFT HEART CATHETERIZATION WITH CORONARY ANGIOGRAM N/A 05/26/2014   Procedure: LEFT HEART CATHETERIZATION WITH CORONARY ANGIOGRAM;  Surgeon: Blane Ohara, MD;  Location: [facility] CATH LAB;  Service: Cardiovascular;  Laterality: N/A;  . LUMBAR LAMINECTOMY/DECOMPRESSION MICRODISCECTOMY Left 09/26/2014   Procedure: Lumbar Laminectomy for resection of synovial cyst  Lumbar five- sacral one left;  Surgeon: Kary Kos, MD;  Location: [facility] NEURO ORS;  Service: Neurosurgery;  Laterality: Left;  . MOLE REMOVAL Left 03/20/2018   Procedure: MOLE REMOVAL;  Surgeon: Alphonsa Overall, MD;  Location: [facility];  Service: General;  Laterality: Left;  . Oral surg  to removed growth from ?sinus  10/2010   ?Dermoid removed by DrRiggs  . PACEMAKER INSERTION  04/03/2017  . POLYPECTOMY    . RCA stenting     '05 RCA  . right toe surgery  Right    Cyst   . RIGHT/LEFT HEART CATH AND CORONARY ANGIOGRAPHY N/A 01/15/2017   Procedure: RIGHT/LEFT HEART CATH AND CORONARY ANGIOGRAPHY;  Surgeon: [name], MD;  Location: [facility] CV LAB;  Service: Cardiovascular;  Laterality: N/A;  . s/p right knee arthroscopy  2005  . thumb surgery Right   . TOTAL KNEE ARTHROPLASTY Right 03/12/2016   Procedure: RIGHT TOTAL KNEE ARTHROPLASTY;  Surgeon: Paralee Cancel, MD;  Location: [facility];  Service: Orthopedics;  Laterality: Right;  . UPPER GASTROINTESTINAL ENDOSCOPY    . VENTRAL HERNIA REPAIR N/A 03/20/2018   Procedure: LAPAROSCOPIC VENTRAL INCISIONAL  HERNIA ERAS PATHWAY;  Surgeon: Alphonsa Overall, MD;  Location: [facility];  Service: General;  Laterality: N/A;        Home Medications    Prior to Admission medications   Medication Sig Start Date End Date Taking? Authorizing Provider  atorvastatin (LIPITOR) 20 MG tablet TAKE 1 TABLET BY MOUTH DAILY 11/17/18   [name], MD  b complex vitamins tablet Take 1 tablet by mouth daily with lunch.     [provider]  calcium carbonate (OSCAL) 1500 (600 Ca) MG TABS tablet Take 600 mg by mouth every evening.    [provider]  carvedilol (COREG) 25 MG tablet TAKE 1 TABLET BY MOUTH TWICE DAILY 09/03/18   Camnitz, Ocie Doyne, MD  CINNAMON PO Take 1 tablet by mouth daily with lunch.     [provider]  Coenzyme Q10 (CO Q 10) 100 MG CAPS Take 1 capsule by mouth daily with lunch.     [provider]  diclofenac sodium (VOLTAREN) 1 % GEL Apply 1 application topically 4 (four) times daily as needed (pain).  10/02/16   [provider]  ELIQUIS 5 MG TABS tablet TAKE 1 TABLET(5 MG) BY MOUTH TWICE DAILY 07/20/18   [name], MD  ENTRESTO 49-51 MG TAKE 1 TABLET BY MOUTH 2 TIMES DAILY 03/05/18    Richardson Dopp T, PA-C  folic acid (FOLVITE) 161 MCG tablet Take 400 mcg by mouth daily.     [provider]  Glucosamine HCl (GLUCOSAMINE PO) Take 1,500 mg by mouth every evening.    [provider]  hydroxychloroquine (PLAQUENIL) 200 MG tablet TAKE 1 TABLET BY MOUTH TWICE DAILY MONDAY THROUGH FRIDAY ONLY. DO NOT TAKE SATURDAY OR SUNDAY 12/22/18   Bo Merino, MD  hydroxypropyl methylcellulose / hypromellose (ISOPTO TEARS / GONIOVISC) 2.5 % ophthalmic solution Place 1 drop into both eyes 4 (four) times daily as needed for dry eyes.     [provider]  Multiple Vitamin (MULTIVITAMIN) capsule Take 1 capsule by mouth daily.      [provider]  Multiple Vitamins-Minerals (OCUVITE PO) Take 1 tablet by mouth at bedtime.    [provider]  NON FORMULARY CPAP machine with sleep.    [provider]  pantoprazole (PROTONIX) 40 MG tablet TAKE 1 TABLET BY MOUTH EVERY DAY BEFORE BREAKFAST GENERIC EQUIVALENT FOR PROTONIX 12/22/17   Richardson Dopp T, PA-C  Probiotic Product (PROBIOTIC DAILY PO) Take 1 capsule by mouth daily with lunch.     [provider]  Saw Palmetto, Serenoa repens, (SAW PALMETTO PO) Take 1 capsule by mouth every evening.    [provider]  spironolactone (ALDACTONE) 25 MG tablet TAKE 1/2 TABLET(12.5 MG) BY MOUTH DAILY 11/02/18   [name], MD  tamsulosin (FLOMAX) 0.4 MG CAPS capsule Take 0.4 mg by mouth at bedtime.    [provider]  Testosterone 20 % CREA Apply 2 mLs topically daily. Rub on shoulder    [provider]  traMADol (ULTRAM) 50 MG tablet TAKE 1 TABLET BY MOUTH AT BEDTIME AS NEEDED 01/14/19   Bo Merino, MD  TURMERIC PO Take 1 tablet by mouth daily with lunch.     [provider]    Family History Family History  Problem Relation Age of Onset  . Coronary artery disease Father   . Diabetes Father   . Sudden death Father        due to heart disease  . Heart  disease Father   . Heart disease Mother   . Hypertension Other   . Stomach cancer Paternal Grandmother   .  COPD Brother   . Arthritis Brother   . Heart failure Brother   . Cancer Brother        lung   . Arthritis Daughter   . Colon cancer Neg Hx   . Esophageal cancer Neg Hx   . Colon polyps Neg Hx   . Ulcerative colitis Neg Hx     Social History Social History   Tobacco Use  . Smoking status: Former Smoker    Quit date: 05/20/1990    Years since quitting: 28.7  . Smokeless tobacco: Never Used  . Tobacco comment: previous 30 pack year history  Substance Use Topics  . Alcohol use: Yes    Comment: rare  . Drug use: Never     Allergies   Sulfonamide derivatives   Review of Systems Review of Systems  All other systems reviewed and are negative.    Physical Exam Updated Vital Signs BP 114/65 (BP Location: Right Arm)   Pulse 92   Temp 98.7 F (37.1 C) (Oral)   Resp 19   Ht 1.727 m (5\' 8" )   Wt 83.9 kg   SpO2 95%   BMI 28.13 kg/m   Physical Exam Vitals signs and nursing note reviewed.  Constitutional:      General: He is not in acute distress.    Appearance: He is well-developed. He is not ill-appearing.  HENT:     Head: Normocephalic.  Neck:     Musculoskeletal: Normal range of motion and neck supple.  Cardiovascular:     Rate and Rhythm: Normal rate and regular rhythm.     Heart sounds: Normal heart sounds.  Pulmonary:     Effort: Pulmonary effort is normal.     Breath sounds: Normal breath sounds.  Abdominal:     General: Bowel sounds are normal.     Palpations: Abdomen is soft.  Musculoskeletal: Normal range of motion.  Skin:    Capillary Refill: Capillary refill takes less than 2 seconds.  Neurological:     General: No focal deficit present.     Mental Status: He is alert.  Psychiatric:        Mood and Affect: Mood normal.      ED Treatments / Results  Labs (all labs ordered are listed, but only abnormal results are displayed) Labs  Reviewed  CBC  BASIC METABOLIC PANEL  TROPONIN I (HIGH SENSITIVITY)    EKG EKG Interpretation  Date/Time:  Sunday January 24 2019 07:01:00 EDT Ventricular Rate:  97 PR Interval:    QRS Duration: 154 QT Interval:  375 QTC Calculation: 477 R Axis:   -72 Text Interpretation:  Sinus rhythm Ventricular bigeminy Nonspecific IVCD with LAD Left ventricular hypertrophy Inferior infarct, acute (LCx) Probable anterolateral infarct, acute >>> Acute MI <<< Confirmed by Pattricia Boss [phone]) on 01/24/2019 7:34:34 AM   Radiology Dg Chest Port 1 View  Result Date: 01/24/2019 CLINICAL DATA:  Chest pain for several weeks. EXAM: PORTABLE CHEST 1 VIEW COMPARISON:  03/28/2017 FINDINGS: The heart size and mediastinal contours are within normal limits. Triple lead transvenous pacemaker remains in appropriate position. Low lung volumes are again noted, however both lungs are clear. IMPRESSION: Stable exam.  No active cardiopulmonary disease. Electronically Signed   By: Marlaine Hind M.D.   On: 01/24/2019 07:37    Procedures .Critical Care Performed by: Pattricia Boss, MD Authorized by: Pattricia Boss, MD   Critical care provider statement:    Critical care time (minutes):  45   Critical care end time:  01/24/2019 7:36 AM   Critical care was necessary to treat or prevent imminent or life-threatening deterioration of the following conditions:  Cardiac failure   Critical care was time spent personally by me on the following activities:  Discussions with consultants, evaluation of patient's response to treatment, examination of patient, ordering and performing treatments and interventions, ordering and review of laboratory studies, ordering and review of radiographic studies, pulse oximetry, re-evaluation of patient's condition, obtaining history from patient or surrogate and review of old charts   (including critical care time)  Medications Ordered in ED Medications - No data to display   Initial Impression  / Assessment and Plan / ED Course  I have reviewed the triage vital signs and the nursing notes.  Pertinent labs & imaging results that were available during my care of the patient were reviewed by me and considered in my medical decision making (see chart for details).   DDX- STEMI vs pericarditis vs aortic dissection vs pe vs other intra thoracic or extrathoracic etiologies of chest pain/back pain Reviewed EKG and ST elevation noted in V5 V6 2 3 and aVF.  Rhythm is paced.  EKG is compared to first prior EKG which is also a paced rhythm and the ST elevation appears new.  Dr. [place] consulted.  He advises to activate STEMI.  Patient will not be heparinized in ED.  He remains hemodynamically stable.  His wife is at the bedside.  We have discussed the plan and he voices understanding. Final Clinical Impressions(s) / ED Diagnoses   Final diagnoses:  ST elevation myocardial infarction (STEMI), unspecified artery [facility])    ED Discharge Orders    None       Pattricia Boss, MD 01/24/19 [phone]

## 2019-01-24 NOTE — Telephone Encounter (Signed)
Patient called stating that since his ACID was placed in August he has been having chest discomfort not really like what his angina has been in the past. He says that they thought it was inflammation from the AICD placement complicated by a microperforatoin and put him on colchicine which improved his pain.  He was seen back by Dr. Curt Bears 8/18 and was still having some mild CP on the left side lasting 1-2 min at a time unlike his typical angina.  Since then he has continued to have this chest discomfort which has gradually become worse.  The discomfort is between his shoulder blades and goes into his chest.  He now cannot lay down because the pain becomes worse and he cannot breathe.  The pain is pleuritic and much worse taking a deep breath in.  He is on anticoagulation for his Eliquis.  PE unlikely since he is anticoagulated. Discomfort different from typical anginal CP.  Suspect he may have pericarditis based on sx.  I have recommended that he go to [facility] ER for further evaluation.  Will need echo to assess for pericardial effusion.  Patient agrees with plan.

## 2019-01-24 NOTE — Progress Notes (Signed)
Responded to [employer].  Offered pastoral presence and prayer to [name].

## 2019-01-24 NOTE — ED Notes (Signed)
MD at bedside. 

## 2019-01-24 NOTE — Progress Notes (Signed)
PHARMACIST - PHYSICIAN ORDER COMMUNICATION  CONCERNING: P&T Medication Policy on Herbal Medications  DESCRIPTION:  This patient's order for:  Coenzyme Q 10 1 capsule daily has been noted.  This product(s) is classified as an "herbal" or natural product. Due to a lack of definitive safety studies or FDA approval, nonstandard manufacturing practices, plus the potential risk of unknown drug-drug interactions while on inpatient medications, the Pharmacy and Therapeutics Committee does not permit the use of "herbal" or natural products of this type within [facility].   ACTION TAKEN: The pharmacy department is unable to verify this order at this time and your patient has been informed of this safety policy. Please reevaluate patient's clinical condition at discharge and address if the herbal or natural product(s) should be resumed at that time.  Berenice Bouton, PharmD PGY1 Pharmacy Resident Office phone: [phone]

## 2019-01-24 NOTE — H&P (Signed)
Cardiology Admission History and Physical:   Patient ID: [name] MRN: [id number]; DOB: February 03, 1946   Admission date: 01/24/2019  Primary Care Provider: Biagio Borg, MD Primary Cardiologist: [name], MD  Primary Electrophysiologist:  Will [name], MD   Chief Complaint:  Chest pain  Patient Profile:   [name] is a 73 y.o. male with CAD, s/p RCA PCI and NICM, s/p ICD, who presents now with SSCP.  History of Present Illness:   [name] is a 73 year old married male followed by Dr. Burt Knack and Dr. Curt Bears with a history of coronary disease and nonischemic cardiomyopathy.  The patient had a remote RCA PCI in 2005.  Catheterization in 2018 showed the RCA stent to be patent and no significant disease in the LAD or circumflex.  He does have a history of cardiomyopathy and LBBB.  In November 2018 he had a Medtronic CRT-D placed.  This procedure was complicated by microperforation treated with a course of colchicine with good results.  Echocardiogram in February 2020 showed normal LV function with evidence of diastolic dysfunction.  Other medical issues include a history of PAF on Eliquis, hypertension, dyslipidemia, sleep apnea on CPAP, and seronegative rheumatoid arthritis followed by Dr. Patrecia Pour.  The patient developed chest pain over the last couple weeks.  He saw Dr. Curt Bears on August 18.  His symptoms were described as "mild".  He was able to exercise without worsening of his symptoms.  He did not feel like his chest pain was similar to prior anginal symptoms.  Last evening the patient's symptoms acutely worsened and he presented to the emergency room this morning.  In the emergency room his EKG, though paced, does suggest some ST changes concerning for ST EMI.  Patient was evaluated by Dr. Martinique in the emergency room.  The patient has taken his Eliquis this morning.  Risk and benefits of the procedure were explained to the patient and his wife and Dr. Martinique suggested  they proceed with urgent catheterization and the patient is agreeable.    Heart Pathway Score:     Past Medical History:  Diagnosis Date  . AICD (automatic cardioverter/defibrillator) present   . Allergic rhinitis   . Allergy   . Anginal pain ([place]) 05/26/14   chest pain after chasing dog  . CAD (coronary artery disease)    hx of stent- 2005 RCA  . Cancer ([place])    skin cancer  . Cataract    beginning stage both eyes  . CHF (congestive heart failure) (H[facility])    pacemaker Medtronic    . Chronic back pain    intermittent  . Constipation   . Dizziness   . Dysrhythmia    right bundle branch block   . Esophageal stricture   . GERD (gastroesophageal reflux disease)   . Hemoptysis   . Hiatal hernia   . History of colonic polyps    hyperplastic  . HTN (hypertension)   . Hyperlipidemia   . Hypertrophy of prostate with urinary obstruction and other lower urinary tract symptoms (LUTS)   . OA (osteoarthritis)   . OSA (obstructive sleep apnea)    cpap- 10   . 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 Pneumonia   . Pre-diabetes    no medications  . Shortness of breath    with exertion  . Sleep apnea    cpap  . Testicular hypofunction  Past Surgical History:  Procedure Laterality Date  . BIV ICD INSERTION CRT-D N/A 03/27/2017   Procedure: BIV ICD INSERTION CRT-D;  Surgeon: Constance Haw, MD;  Location: [place] [facility];  Service: Cardiovascular;  Laterality: N/A;  . CARDIAC CATHETERIZATION     '05, last 2009, showing patent RCA stent  . COLONOSCOPY  12/2007   HYPERPLASTIC POLYP  . COLONOSCOPY WITH PROPOFOL N/A 06/02/2014   Procedure: COLONOSCOPY WITH PROPOFOL;  Surgeon: [name], MD;  Location: [facility];  Service: Endoscopy;  Laterality: N/A;  . CORONARY ANGIOPLASTY  08/2003  . ESOPHAGOGASTRODUODENOSCOPY (EGD) WITH PROPOFOL N/A 06/02/2014   Procedure:  ESOPHAGOGASTRODUODENOSCOPY (EGD) WITH PROPOFOL;  Surgeon: [name], MD;  Location: [facility];  Service: Endoscopy;  Laterality: N/A;  . hernia surgery x 3     Bilateral Inguinal, Umbicial  . INSERTION OF MESH N/A 03/20/2018   Procedure: INSERTION OF MESH;  Surgeon: Alphonsa Overall, MD;  Location: [facility];  Service: General;  Laterality: N/A;  . IRRIGATION AND DEBRIDEMENT ABSCESS Left 08/14/2012   Procedure: IRRIGATION AND DEBRIDEMENT LEFT INGUINAL BOIL ;  Surgeon: Ailene Rud, MD;  Location: [facility];  Service: Urology;  Laterality: Left;  . JOINT REPLACEMENT Right 2017  . KNEE ARTHROPLASTY    . LEFT HEART CATHETERIZATION WITH CORONARY ANGIOGRAM N/A 05/26/2014   Procedure: LEFT HEART CATHETERIZATION WITH CORONARY ANGIOGRAM;  Surgeon: Blane Ohara, MD;  Location: [facility] CATH LAB;  Service: Cardiovascular;  Laterality: N/A;  . LUMBAR LAMINECTOMY/DECOMPRESSION MICRODISCECTOMY Left 09/26/2014   Procedure: Lumbar Laminectomy for resection of synovial cyst  Lumbar five- sacral one left;  Surgeon: Kary Kos, MD;  Location: [place] NEURO ORS;  Service: Neurosurgery;  Laterality: Left;  . MOLE REMOVAL Left 03/20/2018   Procedure: MOLE REMOVAL;  Surgeon: Alphonsa Overall, MD;  Location: [place];  Service: General;  Laterality: Left;  . Oral surg to removed growth from ?sinus  10/2010   ?Dermoid removed by DrRiggs  . PACEMAKER INSERTION  04/03/2017  . POLYPECTOMY    . RCA stenting     '05 RCA  . right toe surgery  Right    Cyst   . RIGHT/LEFT HEART CATH AND CORONARY ANGIOGRAPHY N/A 01/15/2017   Procedure: RIGHT/LEFT HEART CATH AND CORONARY ANGIOGRAPHY;  Surgeon: [name], MD;  Location: [place] CV LAB;  Service: Cardiovascular;  Laterality: N/A;  . s/p right knee arthroscopy  2005  . thumb surgery Right   . TOTAL KNEE ARTHROPLASTY Right 03/12/2016   Procedure: RIGHT TOTAL KNEE ARTHROPLASTY;  Surgeon: Paralee Cancel, MD;  Location: [facility];  Service: Orthopedics;  Laterality: Right;  . UPPER  GASTROINTESTINAL ENDOSCOPY    . VENTRAL HERNIA REPAIR N/A 03/20/2018   Procedure: [place];  Surgeon: Alphonsa Overall, MD;  Location: [place];  Service: General;  Laterality: N/A;     Medications Prior to Admission: Prior to Admission medications   Medication Sig Start Date End Date Taking? Authorizing Provider  atorvastatin (LIPITOR) 20 MG tablet TAKE 1 TABLET BY MOUTH DAILY 11/17/18   [name], MD  b complex vitamins tablet Take 1 tablet by mouth daily with lunch.     [provider]  calcium carbonate (OSCAL) 1500 (600 Ca) MG TABS tablet Take 600 mg by mouth every evening.    [provider]  carvedilol (COREG) 25 MG tablet TAKE 1 TABLET BY MOUTH TWICE DAILY 09/03/18   Camnitz, Ocie Doyne, MD  CINNAMON PO Take 1 tablet by mouth  daily with lunch.     [provider]  Coenzyme Q10 (CO Q 10) 100 MG CAPS Take 1 capsule by mouth daily with lunch.     [provider]  diclofenac sodium (VOLTAREN) 1 % GEL Apply 1 application topically 4 (four) times daily as needed (pain).  10/02/16   [provider]  ELIQUIS 5 MG TABS tablet TAKE 1 TABLET(5 MG) BY MOUTH TWICE DAILY 07/20/18   [name], MD  ENTRESTO 49-51 MG TAKE 1 TABLET BY MOUTH 2 TIMES DAILY 03/05/18   Richardson Dopp T, PA-C  folic acid (FOLVITE) 025 MCG tablet Take 400 mcg by mouth daily.     [provider]  Glucosamine HCl (GLUCOSAMINE PO) Take 1,500 mg by mouth every evening.    [provider]  hydroxychloroquine (PLAQUENIL) 200 MG tablet TAKE 1 TABLET BY MOUTH TWICE DAILY MONDAY THROUGH FRIDAY ONLY. DO NOT TAKE SATURDAY OR SUNDAY 12/22/18   Bo Merino, MD  hydroxypropyl methylcellulose / hypromellose (ISOPTO TEARS / GONIOVISC) 2.5 % ophthalmic solution Place 1 drop into both eyes 4 (four) times daily as needed for dry eyes.     [provider]  Multiple Vitamin (MULTIVITAMIN) capsule Take 1 capsule by mouth daily.       [provider]  Multiple Vitamins-Minerals (OCUVITE PO) Take 1 tablet by mouth at bedtime.    [provider]  NON FORMULARY CPAP machine with sleep.    [provider]  pantoprazole (PROTONIX) 40 MG tablet TAKE 1 TABLET BY MOUTH EVERY DAY BEFORE BREAKFAST GENERIC EQUIVALENT FOR PROTONIX 12/22/17   Richardson Dopp T, PA-C  Probiotic Product (PROBIOTIC DAILY PO) Take 1 capsule by mouth daily with lunch.     [provider]  Saw Palmetto, Serenoa repens, (SAW PALMETTO PO) Take 1 capsule by mouth every evening.    [provider]  spironolactone (ALDACTONE) 25 MG tablet TAKE 1/2 TABLET(12.5 MG) BY MOUTH DAILY 11/02/18   [name], MD  tamsulosin (FLOMAX) 0.4 MG CAPS capsule Take 0.4 mg by mouth at bedtime.    [provider]  Testosterone 20 % CREA Apply 2 mLs topically daily. Rub on shoulder    [provider]  traMADol (ULTRAM) 50 MG tablet TAKE 1 TABLET BY MOUTH AT BEDTIME AS NEEDED 01/14/19   Bo Merino, MD  TURMERIC PO Take 1 tablet by mouth daily with lunch.     [provider]     Allergies:    Allergies  Allergen Reactions  . Sulfonamide Derivatives Rash         Social History:   Social History   Socioeconomic History  . Marital status: Married    Spouse name: Not on file  . Number of children: 1  . Years of education: Not on file  . Highest education level: Not on file  Occupational History  . Occupation: Lobbyist: [employer]  . Financial resource strain: Not on file  . Food insecurity    Worry: Not on file    Inability: Not on file  . Transportation needs    Medical: Not on file    Non-medical: Not on file  Tobacco Use  . Smoking status: Former Smoker    Quit date: 05/20/1990    Years since quitting: 28.7  . Smokeless tobacco: Never Used  . Tobacco comment: previous 30 pack year history  Substance and Sexual Activity  . Alcohol use: Yes     Comment:  rare  . Drug use: Never  . Sexual activity: Not on file  Lifestyle  . Physical activity    Days per week: Not on file    Minutes per session: Not on file  . Stress: Not on file  Relationships  . Social Herbalist on phone: Not on file    Gets together: Not on file    Attends religious service: Not on file    Active member of club or organization: Not on file    Attends meetings of clubs or organizations: Not on file    Relationship status: Not on file  . Intimate partner violence    Fear of current or ex partner: Not on file    Emotionally abused: Not on file    Physically abused: Not on file    Forced sexual activity: Not on file  Other Topics Concern  . Not on file  Social History Narrative  . Not on file    Family History:   The patient's family history includes Arthritis in his brother and daughter; COPD in his brother; Cancer in his brother; Coronary artery disease in his father; Diabetes in his father; Heart disease in his father and mother; Heart failure in his brother; Hypertension in an other family member; Stomach cancer in his paternal grandmother; Sudden death in his father. There is no history of Colon cancer, Esophageal cancer, Colon polyps, or Ulcerative colitis.    ROS:  Please see the history of present illness.  All other ROS reviewed and negative.     Physical Exam/Data:   Vitals:   01/24/19 0700 01/24/19 0702 01/24/19 0723 01/24/19 0745  BP: 121/72  114/65 133/63  Pulse: 98  92 89  Resp: 18  19 (!) 22  Temp: 98.7 F (37.1 C)     TempSrc: Oral     SpO2: 94%  95% 96%  Weight:  83.9 kg    Height:  5\' 8"  (1.727 m)      Intake/Output Summary (Last 24 hours) at 01/24/2019 0809 Last data filed at 01/24/2019 0759 Gross per 24 hour  Intake -  Output 150 ml  Net -150 ml   Last 3 Weights 01/24/2019 01/05/2019 01/05/2019  Weight (lbs) 185 lb 190 lb 190 lb 6.4 oz  Weight (kg) 83.915 kg 86.183 kg 86.365 kg     Body mass index is 28.13 kg/m.   General:  Well nourished, well developed, in no acute distress HEENT: normal Lymph: no adenopathy Neck: no JVD Endocrine:  No thryomegaly Vascular: No carotid bruits; FA pulses 2+ bilaterally without bruits  Cardiac:  normal S1, S2; RRR; no murmur  Lungs:  clear to auscultation bilaterally, no wheezing, rhonchi or rales  Abd: soft, nontender, no hepatomegaly  Ext: no edema Musculoskeletal:  No deformities, BUE and BLE strength normal and equal Skin: warm and dry  Neuro:  CNs 2-12 intact, no focal abnormalities noted Psych:  Normal affect    EKG:  The ECG that was done 01/24/2019 was personally reviewed and demonstrates paced rhythm with new ST changes concerning for STEMI  Relevant CV Studies: Echo Feb 2020- IMPRESSIONS    1. The left ventricle has normal systolic function, with an ejection fraction of 55-60%. The cavity size was normal. There is moderately increased left ventricular wall thickness. Left ventricular diastolic Doppler parameters are consistent with  impaired relaxation.  2. Abnormal septal motion secondary to pacing.  3. The right ventricle has normal systolic function. The cavity was normal. There  is no increase in right ventricular wall thickness.  4. The mitral valve is normal in structure.  5. The tricuspid valve is normal in structure.  6. The aortic valve is normal in structure. Aortic valve regurgitation is trivial by color flow Doppler.  7. The pulmonic valve was normal in structure.  Laboratory Data:  High Sensitivity Troponin:   Recent Labs  Lab 01/24/19 0645  TROPONINIHS 8      Chemistry Recent Labs  Lab 01/24/19 0645  NA 136  K 3.9  CL 104  CO2 23  GLUCOSE 146*  BUN 14  CREATININE 0.89  CALCIUM 9.6  GFRNONAA >60  GFRAA >60  ANIONGAP 9    No results for input(s): PROT, ALBUMIN, AST, ALT, ALKPHOS, BILITOT in the last 168 hours. Hematology Recent Labs  Lab 01/24/19 0645  WBC 9.5  RBC 4.86  HGB 14.6  HCT 44.6  MCV 91.8  MCH  30.0  MCHC 32.7  RDW 13.3  PLT 250   BNPNo results for input(s): BNP, PROBNP in the last 168 hours.  DDimer No results for input(s): DDIMER in the last 168 hours.   Radiology/Studies:  Dg Chest Port 1 View  Result Date: 01/24/2019 CLINICAL DATA:  Chest pain for several weeks. EXAM: PORTABLE CHEST 1 VIEW COMPARISON:  03/28/2017 FINDINGS: The heart size and mediastinal contours are within normal limits. Triple lead transvenous pacemaker remains in appropriate position. Low lung volumes are again noted, however both lungs are clear. IMPRESSION: Stable exam.  No active cardiopulmonary disease. Electronically Signed   By: Marlaine Hind M.D.   On: 01/24/2019 07:37    Assessment and Plan:   STEMI- Symptoms and EKG concerning for STEMI- plan urgent cath  CAD- RCA PCI in 2005- patent RCA stent at cath 2018, no other significant CAD  NICM  Prior EF 35% with LBBB  ICD- S/P MDT CRT-D Nov 1517 complicated by microperforation Rx'd with course of colchicine  HTN- On Entresto, Coreg, and Aldactone  HLD- LDL 38 on Lipitor 20 mg 01/24/2019  OSA- On C-pap  RA- On Plaquenil  Plan: Urgent cath   Severity of Illness: The appropriate patient status for this patient is INPATIENT. Inpatient status is judged to be reasonable and necessary in order to provide the required intensity of service to ensure the patient's safety. The patient's presenting symptoms, physical exam findings, and initial radiographic and laboratory data in the context of their chronic comorbidities is felt to place them at high risk for further clinical deterioration. Furthermore, it is not anticipated that the patient will be medically stable for discharge from the hospital within 2 midnights of admission. The following factors support the patient status of inpatient.   " The patient's presenting symptoms include chest pain. " The worrisome physical exam findings include none. " The initial radiographic and laboratory data  are worrisome because of abnormal EKG. " The chronic co-morbidities include HTN,HLD.   * I certify that at the point of admission it is my clinical judgment that the patient will require inpatient hospital care spanning beyond 2 midnights from the point of admission due to high intensity of service, high risk for further deterioration and high frequency of surveillance required.*    For questions or updates, please contact [place] Please consult [email or web address] for contact info under        Signed, Kerin Ransom, PA-C  01/24/2019 8:09 AM

## 2019-01-24 NOTE — ED Triage Notes (Signed)
  Patient comes in with chest pain that has been going on for a few weeks.  Patient  had a pacemaker placed in 2018 and has had continued discomfort.  Patient states pain is mid chest and radiates to his shoulder blades.  Patient states the pain is worse when he lays down flat.  Pain 5/10.  No diaphoresis, nausea, or vomiting.  Patient on eliquis.

## 2019-01-24 NOTE — Progress Notes (Signed)
*  PRELIMINARY RESULTS* Echocardiogram 2D Echocardiogram has been performed.  Leavy Cella 01/24/2019, 5:54 PM

## 2019-01-25 DIAGNOSIS — I2510 Atherosclerotic heart disease of native coronary artery without angina pectoris: Secondary | ICD-10-CM | POA: Diagnosis not present

## 2019-01-25 DIAGNOSIS — R079 Chest pain, unspecified: Secondary | ICD-10-CM | POA: Diagnosis not present

## 2019-01-25 MED ORDER — FOLIC ACID 1 MG PO TABS
500.0000 ug | ORAL_TABLET | Freq: Every day | ORAL | Status: DC
  Administered 2019-01-25: 0.5 mg via ORAL
  Filled 2019-01-25: qty 1

## 2019-01-25 MED ORDER — NITROGLYCERIN 0.4 MG SL SUBL: 0.4000 mg | SUBLINGUAL_TABLET | SUBLINGUAL | 0 refills | Status: DC | PRN

## 2019-01-25 NOTE — Care Management CC44 (Signed)
Condition Code 44 Documentation Completed  Patient Details  Name: [name] MRN: [id number] Date of Birth: 25-Jul-1945   Condition Code 44 given:  Yes Patient signature on Condition Code 44 notice:  Yes Documentation of 2 MD's agreement:  Yes Code 44 added to claim:  Yes    Dawayne Patricia, RN 01/25/2019, 10:29 AM

## 2019-01-25 NOTE — Care Management Obs Status (Signed)
[place] NOTIFICATION   Patient Details  Name: AMRAM MAYA MRN: [id number] Date of Birth: Sep 06, 1945   Medicare Observation Status Notification Given:  Yes    Dawayne Patricia, RN 01/25/2019, 10:29 AM

## 2019-01-25 NOTE — Progress Notes (Signed)
Progress Note  Patient Name: [name] Date of Encounter: 01/25/2019  Primary Cardiologist: [name], MD   Subjective   Patient feels well this am. No further chest pain/pressure  Inpatient Medications    Scheduled Meds: . apixaban  5 mg Oral BID  . atorvastatin  20 mg Oral Daily  . calcium carbonate  1,500 mg Oral QPM  . carvedilol  25 mg Oral BID  . colchicine  0.6 mg Oral BID  . folic acid  542 mcg Oral Daily  . hydroxychloroquine  200 mg Oral BID  . multivitamin  1 tablet Oral Daily  . pantoprazole  40 mg Oral Daily  . sacubitril-valsartan  1 tablet Oral BID  . sodium chloride flush  3 mL Intravenous Q12H  . spironolactone  12.5 mg Oral Daily  . tamsulosin  0.4 mg Oral QHS   Continuous Infusions: . sodium chloride 10 mL/hr at 01/24/19 0744  . sodium chloride     PRN Meds: sodium chloride, acetaminophen, ALPRAZolam, diclofenac sodium, hydroxypropyl methylcellulose / hypromellose, nitroGLYCERIN, ondansetron (ZOFRAN) IV, sodium chloride flush, traMADol, zolpidem   Vital Signs    Vitals:   01/24/19 1959 01/24/19 2300 01/24/19 2357 01/25/19 0539  BP: 130/71  134/73 118/67  Pulse: 94 83 100 88  Resp: 12 16 16 17   Temp: 99.6 F (37.6 C)  98.9 F (37.2 C) 98.3 F (36.8 C)  TempSrc: Oral  Axillary Oral  SpO2: 95% 93% 93% 91%  Weight:    84.2 kg  Height:        Intake/Output Summary (Last 24 hours) at 01/25/2019 0722 Last data filed at 01/25/2019 0130 Gross per 24 hour  Intake 1588.95 ml  Output 1800 ml  Net -211.05 ml   Last 3 Weights 01/25/2019 01/24/2019 01/24/2019  Weight (lbs) 185 lb 11.2 oz 185 lb 6.4 oz 185 lb  Weight (kg) 84.233 kg 84.097 kg 83.915 kg      Telemetry    NSR with BiV pacing. PVCs - Personally Reviewed  ECG    none - Personally Reviewed  Physical Exam   GEN: No acute distress.   Neck: No JVD Cardiac: RRR, no murmurs, rubs, or gallops. No chest wall tenderness to palpation. Pacer site looks good. Respiratory: Clear to  auscultation bilaterally. GI: Soft, nontender, non-distended  MS: No edema; No deformity. Neuro:  Nonfocal  Psych: Normal affect   Labs    High Sensitivity Troponin:   Recent Labs  Lab 01/24/19 0645 01/24/19 0725 01/24/19 1215  TROPONINIHS 8 7 5       Chemistry Recent Labs  Lab 01/24/19 0645 01/24/19 0725  NA 136 136  K 3.9 3.9  CL 104 104  CO2 23 21*  GLUCOSE 146* 142*  BUN 14 14  CREATININE 0.89 0.84  CALCIUM 9.6 9.4  PROT  --  6.5  ALBUMIN  --  3.7  AST  --  24  ALT  --  20  ALKPHOS  --  62  BILITOT  --  1.5*  GFRNONAA >60 >60  GFRAA >60 >60  ANIONGAP 9 11     Hematology Recent Labs  Lab 01/24/19 0645  WBC 9.5  RBC 4.86  HGB 14.6  HCT 44.6  MCV 91.8  MCH 30.0  MCHC 32.7  RDW 13.3  PLT 250    BNP Recent Labs  Lab 01/24/19 1015  BNP 40.2     DDimer No results for input(s): DDIMER in the last 168 hours.   Radiology  Dg Chest Port 1 View  Result Date: 01/24/2019 CLINICAL DATA:  Chest pain for several weeks. EXAM: PORTABLE CHEST 1 VIEW COMPARISON:  03/28/2017 FINDINGS: The heart size and mediastinal contours are within normal limits. Triple lead transvenous pacemaker remains in appropriate position. Low lung volumes are again noted, however both lungs are clear. IMPRESSION: Stable exam.  No active cardiopulmonary disease. Electronically Signed   By: Marlaine Hind M.D.   On: 01/24/2019 07:37    Cardiac Studies   Procedures  LEFT HEART CATH AND CORONARY ANGIOGRAPHY  Conclusion    Mid LAD lesion is 30% stenosed.  Non-stenotic Mid RCA lesion was previously treated.  The left ventricular systolic function is normal.  LV end diastolic pressure is normal.  The left ventricular ejection fraction is 55-65% by visual estimate.   1. Nonobstructive CAD. Continued patency of the stent in the RCA 2. Good LV systolic function 3. Normal LVEDP  Plan: will obtain an Echo. Consider a trial of colchicine. Resume Eliquis tomorrow am.   Echo:  IMPRESSIONS    1. The left ventricle has low normal systolic function, with an ejection fraction of 50-55%. The cavity size was normal. There is moderate concentric left ventricular hypertrophy. Left ventricular diastolic Doppler parameters are consistent with  pseudonormalization.  2. The right ventricle has normal systolic function. The cavity was normal. There is no increase in right ventricular wall thickness.  3. Trivial pericardial effusion is present  Patient Profile     73 y.o. male with history of CAD with prior stents presents with acute chest pain  Assessment & Plan    1. Acute chest pain- now resolved. Troponin levels all normal. Cardiac cath with nonobstructive disease. Echo is unremarkable with good EF 50-55%. Trivial effusion- unchanged from prior. Etiology of pain is unclear. Patient states it went away as soon as we did cath. No evidence of inflammation with no fever and normal WBC. He is already on acid reflux therapy. CT chest one year ago unremarkable. Will plan DC home today on same prior medication. Follow up in office. 2. Cardiomyopathy. Significant improvement in EF with BiV pacing and optimal medical therapy 3. Hyperlipidemia. Excellent lipid levels. Continue therapy. Was scheduled to have labs done in office this week. This can be cancelled.  4. OSA on CPAP 5. RA. On Plaquenil. 6. CAD s/p stent of RCA 2005. Nonobstructive CAD on cath  Plan: DC home today.   For questions or updates, please contact [place] Please consult [email or web address] for contact info under        Signed, Nyonna Hargrove Martinique, MD  01/25/2019, 7:22 AM

## 2019-01-25 NOTE — Discharge Summary (Signed)
Discharge Summary    Patient ID: [name] MRN: [id number]; DOB: Oct 14, 1945  Admit date: 01/24/2019 Discharge date: 01/25/2019  Primary Care Provider: Biagio Borg, MD  Primary Cardiologist: [name], MD  Primary Electrophysiologist:  Will [name], MD   Discharge Diagnoses    Principal Problem:   Acute chest pain Active Problems:   Hyperlipidemia   Obstructive sleep apnea   Essential hypertension   CAD (coronary artery disease)   Chronic systolic heart failure (HCC)   Rheumatoid arthritis ([place])   STEMI (ST elevation myocardial infarction) ([place])  Allergies Allergies  Allergen Reactions  . Sulfonamide Derivatives Rash        Diagnostic Studies/Procedures    Cardiac catheterization 01/24/2019:   Mid LAD lesion is 30% stenosed.  Non-stenotic Mid RCA lesion was previously treated.  The left ventricular systolic function is normal.  LV end diastolic pressure is normal.  The left ventricular ejection fraction is 55-65% by visual estimate.   1. Nonobstructive CAD. Continued patency of the stent in the RCA 2. Good LV systolic function 3. Normal LVEDP  Plan: will obtain an Echo. Consider a trial of colchicine. Resume Eliquis tomorrow am.   Echocardiogram 01/24/2019:  1. The left ventricle has low normal systolic function, with an ejection fraction of 50-55%. The cavity size was normal. There is moderate concentric left ventricular hypertrophy. Left ventricular diastolic Doppler parameters are consistent with  pseudonormalization.  2. The right ventricle has normal systolic function. The cavity was normal. There is no increase in right ventricular wall thickness.  3. Trivial pericardial effusion is present.  History of Present Illness     [name] is a 73 year old M followed by Dr. Burt Knack and Dr. Curt Bears with a history of coronary disease and nonischemic cardiomyopathy. He had a remote RCA PCI in 2005.  Catheterization in 2018 showed the RCA  stent to be patent and no significant disease in the LAD or circumflex. He does have a history of cardiomyopathy and LBBB.  In November 2018 he had a Medtronic CRT-D placed. This procedure was complicated by microperforation treated with a course of colchicine with good results. Echocardiogram in 06/2018 showed normal LV function with evidence of diastolic dysfunction. Other medical issues include a history of PAF on Eliquis, hypertension, dyslipidemia, sleep apnea on CPAP, and seronegative rheumatoid arthritis followed by Dr. Patrecia Pour.  He reported developing chest pain over the last couple weeks. His symptoms were described as "mild".  He was able to exercise without worsening of his symptoms. He did not feel like his chest pain was similar to prior anginal symptoms. The evening prior to admission, his symptoms acutely worsened and he presented to the emergency room for further assessment. In the emergency room his EKG, though paced, suggested some ST changes concerning for ST STEMI. Patient was evaluated by Dr. Martinique in the emergency room in which an urgent cardiac catheterization was recommended.   Hospital Course     He was taken to the cath lab on 01/24/2019 as an urgent cath out of concern for STEMI. Cath revealed non-obstructive CAD with continued patency of the mRCA stent. There was good LV function, confirmed by echocardiogram.   Eliquis resumed on day of discharge. Cath site unremarkable. Pt has ambulated without complication. Will send message to our office to schedule follow up appointment and call patient with date and time.    -Acute chest pain: Resolved. Troponin levels all normal. Cardiac cath with nonobstructive disease. Echo is unremarkable with good EF 50-55%. Trivial  effusion on echocardiogram, unchanged from prior. Etiology of pain is unclear. Patient reported resolution during post cath. No evidence of inflammation with no fever and normal WBC. He is already on acid reflux therapy.  CT chest one year ago unremarkable.   -Cardiomyopathy. Significant improvement in EF with BiV pacing and optimal medical therapy  -Hyperlipidemia. Excellent lipid levels. Continue therapy. Was scheduled to have labs done in office this week. This can be cancelled.   -OSA on CPAP  -RA. On Plaquenil  Consultants: None   The patient was seen and examined by Dr. Martinique who feels that he is stable and ready for discharge today, 01/25/2019.  _____________  Discharge Vitals Blood pressure 118/67, pulse 88, temperature 98.3 F (36.8 C), temperature source Oral, resp. rate 17, height 5\' 7"  (1.702 m), weight 84.2 kg, SpO2 91 %.  Filed Weights   01/24/19 0702 01/24/19 0951 01/25/19 0539  Weight: 83.9 kg 84.1 kg 84.2 kg   Labs & Radiologic Studies    CBC Recent Labs    01/24/19 0645  WBC 9.5  HGB 14.6  HCT 44.6  MCV 91.8  PLT 595   Basic Metabolic Panel Recent Labs    01/24/19 0645 01/24/19 0725  NA 136 136  K 3.9 3.9  CL 104 104  CO2 23 21*  GLUCOSE 146* 142*  BUN 14 14  CREATININE 0.89 0.84  CALCIUM 9.6 9.4   Liver Function Tests Recent Labs    01/24/19 0725  AST 24  ALT 20  ALKPHOS 62  BILITOT 1.5*  PROT 6.5  ALBUMIN 3.7   No results for input(s): LIPASE, AMYLASE in the last 72 hours. High Sensitivity Troponin:   Recent Labs  Lab 01/24/19 0645 01/24/19 0725 01/24/19 1215  TROPONINIHS 8 7 5     Fasting Lipid Panel Recent Labs    01/24/19 0725  CHOL 97  HDL 27*  LDLCALC 38  TRIG 161*  CHOLHDL 3.6   Thyroid Function Tests Recent Labs    01/24/19 1015  TSH 2.402   _____________  Dg Chest Port 1 View  Result Date: 01/24/2019 CLINICAL DATA:  Chest pain for several weeks. EXAM: PORTABLE CHEST 1 VIEW COMPARISON:  03/28/2017 FINDINGS: The heart size and mediastinal contours are within normal limits. Triple lead transvenous pacemaker remains in appropriate position. Low lung volumes are again noted, however both lungs are clear. IMPRESSION: Stable  exam.  No active cardiopulmonary disease. Electronically Signed   By: Marlaine Hind M.D.   On: 01/24/2019 07:37   Disposition   Pt is being discharged home today in good condition.  Follow-up Plans & Appointments     Discharge Instructions    Call MD for:  difficulty breathing, headache or visual disturbances   Complete by: As directed    Call MD for:  extreme fatigue   Complete by: As directed    Call MD for:  hives   Complete by: As directed    Call MD for:  persistant dizziness or light-headedness   Complete by: As directed    Call MD for:  persistant nausea and vomiting   Complete by: As directed    Call MD for:  redness, tenderness, or signs of infection (pain, swelling, redness, odor or green/yellow discharge around incision site)   Complete by: As directed    Call MD for:  severe uncontrolled pain   Complete by: As directed    Call MD for:  temperature >100.4   Complete by: As directed    Diet -  low sodium heart healthy   Complete by: As directed    Discharge instructions   Complete by: As directed    No driving for 3 days. No lifting over 5 lbs for 1 week. No sexual activity for 1 week. Keep procedure site clean & dry. If you notice increased pain, swelling, bleeding or pus, call/return!  You may shower, but no soaking baths/hot tubs/pools for 1 week.   Our office will call you with date and time for your follow up appointment.   Increase activity slowly   Complete by: As directed       Discharge Medications   Allergies as of 01/25/2019      Reactions   Sulfonamide Derivatives Rash          Medication List    TAKE these medications   acetaminophen 500 MG tablet Commonly known as: TYLENOL Take 500 mg by mouth 2 (two) times daily.   atorvastatin 20 MG tablet Commonly known as: LIPITOR TAKE 1 TABLET BY MOUTH DAILY What changed: when to take this   b complex vitamins tablet Take 1 tablet by mouth daily with lunch.   calcium carbonate 1500 (600 Ca) MG  Tabs tablet Commonly known as: OSCAL Take 600 mg by mouth every evening.   carvedilol 25 MG tablet Commonly known as: COREG TAKE 1 TABLET BY MOUTH TWICE DAILY What changed: when to take this   CINNAMON PO Take 1 tablet by mouth daily with lunch.   Co Q 10 100 MG Caps Take 1 capsule by mouth daily with lunch.   diclofenac sodium 1 % Gel Commonly known as: VOLTAREN Apply 1 application topically 4 (four) times daily as needed (pain).   Eliquis 5 MG Tabs tablet Generic drug: apixaban TAKE 1 TABLET(5 MG) BY MOUTH TWICE DAILY What changed: See the new instructions.   Entresto 49-51 MG Generic drug: sacubitril-valsartan TAKE 1 TABLET BY MOUTH 2 TIMES DAILY   folic acid 509 MCG tablet Commonly known as: FOLVITE Take 400 mcg by mouth daily.   GLUCOSAMINE PO Take 1,500 mg by mouth every evening.   hydrocortisone 2.5 % cream Apply 1 application topically 2 (two) times daily as needed (rash on nose).   hydroxychloroquine 200 MG tablet Commonly known as: PLAQUENIL TAKE 1 TABLET BY MOUTH TWICE DAILY MONDAY THROUGH FRIDAY ONLY. DO NOT TAKE SATURDAY OR SUNDAY What changed:   how much to take  how to take this  when to take this   hydroxypropyl methylcellulose / hypromellose 2.5 % ophthalmic solution Commonly known as: ISOPTO TEARS / GONIOVISC Place 1 drop into both eyes 4 (four) times daily as needed for dry eyes.   multivitamin capsule Take 1 capsule by mouth daily.   nitroGLYCERIN 0.4 MG SL tablet Commonly known as: NITROSTAT Place 1 tablet (0.4 mg total) under the tongue every 5 (five) minutes x 3 doses as needed for chest pain.   NON FORMULARY CPAP machine with sleep.   OCUVITE PO Take 1 tablet by mouth at bedtime.   pantoprazole 40 MG tablet Commonly known as: PROTONIX TAKE 1 TABLET BY MOUTH EVERY DAY BEFORE BREAKFAST GENERIC EQUIVALENT FOR PROTONIX What changed:   how much to take  how to take this  when to take this  additional instructions    PROBIOTIC DAILY PO Take 1 capsule by mouth daily with lunch.   SAW PALMETTO PO Take 1 capsule by mouth every evening.   spironolactone 25 MG tablet Commonly known as: ALDACTONE TAKE 1/2 TABLET(12.5 MG) BY  MOUTH DAILY What changed: See the new instructions.   tamsulosin 0.4 MG Caps capsule Commonly known as: FLOMAX Take 0.4 mg by mouth at bedtime.   Testosterone 20 % Crea Apply 2 mLs topically daily. Rub on shoulder   traMADol 50 MG tablet Commonly known as: ULTRAM TAKE 1 TABLET BY MOUTH AT BEDTIME AS NEEDED What changed: when to take this   TURMERIC PO Take 1 tablet by mouth daily with lunch.        Acute coronary syndrome (MI, NSTEMI, STEMI, etc) this admission?: No    Outstanding Labs/Studies   None   Duration of Discharge Encounter   Greater than 30 minutes including physician time.  Signed, Kathyrn Drown, NP 01/25/2019, 7:51 AM

## 2019-01-25 NOTE — Progress Notes (Deleted)
Patient very lethargic but easily arousable and answers appropriately, has not voided in over 12 hours, bladder scan done 150cc noted at most, will give 1l NSS over 2 hours and continue to monitor. Spoke with MD and with patient's daughter, MD will come up and assess patient herself and will speak with daughter at her request, will continue to monitor.

## 2019-01-26 ENCOUNTER — Encounter (HOSPITAL_COMMUNITY): Payer: Self-pay | Admitting: Cardiology

## 2019-01-26 MED FILL — Nitroglycerin IV Soln 100 MCG/ML in D5W: INTRA_ARTERIAL | Qty: 10 | Status: AC

## 2019-01-26 NOTE — Telephone Encounter (Signed)
Followed up w/ pt per [facility] request. Pt reports doing ok but needs f/u w/ cardiology.  States he was told to follow up with our office but not sure which doctor he needs to follow up with.  Pt informed that I would discuss further w/ [place] and let him know by the end of the week. Patient verbalized understanding and agreeable to plan.

## 2019-01-27 ENCOUNTER — Telehealth: Payer: Self-pay | Admitting: *Deleted

## 2019-01-27 ENCOUNTER — Other Ambulatory Visit: Payer: Medicare Other

## 2019-01-27 NOTE — Telephone Encounter (Signed)
Pt was on TCM report admitted 9+/6/20 for CP. Pt had a urgent cath out of concern for STEMI. Cath revealed non-obstructive CAD with continued patency of the mRCA stent. There was good LV function, confirmed by echocardiogram. Pt D/C 01/25/19, and will follow-up w/cardiology in 2 weeks.Marland KitchenJohny Chess

## 2019-01-28 ENCOUNTER — Other Ambulatory Visit: Payer: Self-pay | Admitting: Cardiovascular Disease

## 2019-01-29 NOTE — Telephone Encounter (Signed)
Pt has already been scheduled to see APP on 9/22.  Informed pt that if APP feels he needs to be seen by [facility], then APP will have him schedule this at that [place]. Patient verbalized understanding and agreeable to plan.

## 2019-02-08 NOTE — Progress Notes (Signed)
Cardiology Office Note    Date:  02/09/2019   ID:  [name], DOB 01/05/1946, MRN [id number]  PCP:  Biagio Borg, MD  Primary Cardiologist: [name], MD  Primary Electrophysiologist:  Will [name], MD   Chief Complaint: Hospital follow up   History of Present Illness:   [name] is a 73 y.o. male with hx of CAD, NICM s/p ICD, PAF on Eliquis, hypertension, dyslipidemia, sleep apnea on CPAP, and seronegativerheumatoid arthritis presents for hospital follow up.   He had a remote RCA PCI in 2005. Catheterization in 2018 showed the RCA stent to be patent and no significant disease in the LAD or circumflex. He does have a history of cardiomyopathy and LBBB. In November 2018 he had a Medtronic CRT-D placed. This procedure was complicated by microperforation treated with a course of colchicine with good results. Echocardiogram in 06/2018 showed normal LV function with evidence of diastolic dysfunction.   Admitted 01/2019 with CP. In the emergency room his EKG, though paced, suggested some ST changes concerning for ST STEMI. Emergent cath revealed non-obstructive CAD with continued patency of the mRCA stent. There was good LV function, confirmed by echocardiogram. No recurrent pain.   Here today for follow up.  He continues to have intermittent substernal chest pressure, especially with laying down.  His presenting symptoms to emergency room was chest pressure with laying down.  He did felt better after pain medication during cath and colchicine which did not continue at discharge.  Current symptoms is similar when he was treated with colchicine for 10 days after his pacemaker implantation.  Currently having intermittent cough and congestion.  No fever or chills.   Past Medical History:  Diagnosis Date  . AICD (automatic cardioverter/defibrillator) present   . Allergic rhinitis   . Allergy   . Anginal pain ([place]) 05/26/14   chest pain after chasing dog  . CAD (coronary  artery disease)    hx of stent- 2005 RCA  . Cancer ([place])    skin cancer  . Cataract    beginning stage both eyes  . CHF (congestive heart failure) (H[facility])    pacemaker Medtronic    . Chronic back pain    intermittent  . Constipation   . Dizziness   . Dysrhythmia    right bundle branch block   . Esophageal stricture   . GERD (gastroesophageal reflux disease)   . Hemoptysis   . Hiatal hernia   . History of colonic polyps    hyperplastic  . HTN (hypertension)   . Hyperlipidemia   . Hypertrophy of prostate with urinary obstruction and other lower urinary tract symptoms (LUTS)   . OA (osteoarthritis)   . OSA (obstructive sleep apnea)    cpap- 10   . 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 Pneumonia   . Pre-diabetes    no medications  . Shortness of breath    with exertion  . Sleep apnea    cpap  . Testicular hypofunction     Past Surgical History:  Procedure Laterality Date  . BIV ICD INSERTION CRT-D N/A 03/27/2017   Procedure: BIV ICD INSERTION CRT-D;  Surgeon: Constance Haw, MD;  Location: [facility];  Service: Cardiovascular;  Laterality: N/A;  . CARDIAC CATHETERIZATION     '05, last 2009, showing patent RCA stent  . COLONOSCOPY  12/2007   HYPERPLASTIC POLYP  .  COLONOSCOPY WITH PROPOFOL N/A 06/02/2014   Procedure: COLONOSCOPY WITH PROPOFOL;  Surgeon: [name], MD;  Location: [facility];  Service: Endoscopy;  Laterality: N/A;  . CORONARY ANGIOPLASTY  08/2003  . ESOPHAGOGASTRODUODENOSCOPY (EGD) WITH PROPOFOL N/A 06/02/2014   Procedure: ESOPHAGOGASTRODUODENOSCOPY (EGD) WITH PROPOFOL;  Surgeon: [name], MD;  [facility];  Service: Endoscopy;  Laterality: N/A;  . hernia surgery x 3     Bilateral Inguinal, Umbicial  . INSERTION OF MESH N/A 03/20/2018   Procedure: INSERTION OF MESH;  Surgeon: Alphonsa Overall, MD;  Location: [facility];  Service: General;   Laterality: N/A;  . IRRIGATION AND DEBRIDEMENT ABSCESS Left 08/14/2012   Procedure: IRRIGATION AND DEBRIDEMENT LEFT INGUINAL BOIL ;  Surgeon: Ailene Rud, MD;  Location: [facility];  Service: Urology;  Laterality: Left;  . JOINT REPLACEMENT Right 2017  . KNEE ARTHROPLASTY    . LEFT HEART CATH AND CORONARY ANGIOGRAPHY N/A 01/24/2019   Procedure: LEFT HEART CATH AND CORONARY ANGIOGRAPHY;  Surgeon: Martinique, Peter M, MD;  Location: [facility];  Service: Cardiovascular;  Laterality: N/A;  . LEFT HEART CATHETERIZATION WITH CORONARY ANGIOGRAM N/A 05/26/2014   Procedure: LEFT HEART CATHETERIZATION WITH CORONARY ANGIOGRAM;  Surgeon: Blane Ohara, MD;  [facility];  Service: Cardiovascular;  Laterality: N/A;  . LUMBAR LAMINECTOMY/DECOMPRESSION MICRODISCECTOMY Left 09/26/2014   Procedure: Lumbar Laminectomy for resection of synovial cyst  Lumbar five- sacral one left;  Surgeon: Kary Kos, MD;  Location: [facility];  Service: Neurosurgery;  Laterality: Left;  . MOLE REMOVAL Left 03/20/2018   Procedure: MOLE REMOVAL;  Surgeon: Alphonsa Overall, MD;  Location: [facility];  Service: General;  Laterality: Left;  . Oral surg to removed growth from ?sinus  10/2010   ?Dermoid removed by DrRiggs  . PACEMAKER INSERTION  04/03/2017  . POLYPECTOMY    . RCA stenting     '05 RCA  . right toe surgery  Right    Cyst   . RIGHT/LEFT HEART CATH AND CORONARY ANGIOGRAPHY N/A 01/15/2017   Procedure: RIGHT/LEFT HEART CATH AND CORONARY ANGIOGRAPHY;  Surgeon: [name], MD;  Location: [facility];  Service: Cardiovascular;  Laterality: N/A;  . s/p right knee arthroscopy  2005  . thumb surgery Right   . TOTAL KNEE ARTHROPLASTY Right 03/12/2016   Procedure: RIGHT TOTAL KNEE ARTHROPLASTY;  Surgeon: Paralee Cancel, MD;  [facility];  Service: Orthopedics;  Laterality: Right;  . UPPER GASTROINTESTINAL ENDOSCOPY    . VENTRAL HERNIA REPAIR N/A 03/20/2018   Procedure: [place];  Surgeon: Alphonsa Overall, MD;  Location: [facility];  Service: General;  Laterality: N/A;    Current Medications: Prior to Admission medications   Medication Sig Start Date End Date Taking? Authorizing Provider  acetaminophen (TYLENOL) 500 MG tablet Take 500 mg by mouth 2 (two) times daily.    [provider]  atorvastatin (LIPITOR) 20 MG tablet TAKE 1 TABLET BY MOUTH DAILY Patient taking differently: Take 20 mg by mouth daily at 6 PM.  11/17/18   [name], MD  b complex vitamins tablet Take 1 tablet by mouth daily with lunch.     [provider]  calcium carbonate (OSCAL) 1500 (600 Ca) MG TABS tablet Take 600 mg by mouth every evening.    [provider]  carvedilol (COREG) 25 MG tablet TAKE 1 TABLET BY MOUTH TWICE DAILY Patient taking differently: Take 25 mg by mouth 2 (two) times daily with  a meal.  09/03/18   Camnitz, Ocie Doyne, MD  CINNAMON PO Take 1 tablet by mouth daily with lunch.     [provider]  Coenzyme Q10 (CO Q 10) 100 MG CAPS Take 1 capsule by mouth daily with lunch.     [provider]  diclofenac sodium (VOLTAREN) 1 % GEL Apply 1 application topically 4 (four) times daily as needed (pain).  10/02/16   [provider]  ELIQUIS 5 MG TABS tablet TAKE 1 TABLET(5 MG) BY MOUTH TWICE DAILY Patient taking differently: Take 5 mg by mouth 2 (two) times daily.  07/20/18   [name], MD  ENTRESTO 49-51 MG TAKE 1 TABLET BY MOUTH 2 TIMES DAILY Patient taking differently: Take 1 tablet by mouth 2 (two) times daily.  03/05/18   Richardson Dopp T, PA-C  folic acid (FOLVITE) A999333 MCG tablet Take 400 mcg by mouth daily.     [provider]  Glucosamine HCl (GLUCOSAMINE PO) Take 1,500 mg by mouth every evening.    [provider]  hydrocortisone 2.5 % cream Apply 1 application topically 2 (two) times daily as needed (rash on nose).  01/14/19   [provider]  hydroxychloroquine (PLAQUENIL)  200 MG tablet TAKE 1 TABLET BY MOUTH TWICE DAILY MONDAY THROUGH FRIDAY ONLY. DO NOT TAKE SATURDAY OR SUNDAY Patient taking differently: Take 200 mg by mouth every Monday, Tuesday, Wednesday, Thursday, and Friday. TAKE 1 TABLET BY MOUTH TWICE DAILY MONDAY THROUGH FRIDAY ONLY. DO NOT TAKE SATURDAY OR SUNDAY 12/22/18   Bo Merino, MD  hydroxypropyl methylcellulose / hypromellose (ISOPTO TEARS / GONIOVISC) 2.5 % ophthalmic solution Place 1 drop into both eyes 4 (four) times daily as needed for dry eyes.     [provider]  Multiple Vitamin (MULTIVITAMIN) capsule Take 1 capsule by mouth daily.      [provider]  Multiple Vitamins-Minerals (OCUVITE PO) Take 1 tablet by mouth at bedtime.    [provider]  nitroGLYCERIN (NITROSTAT) 0.4 MG SL tablet Place 1 tablet (0.4 mg total) under the tongue every 5 (five) minutes x 3 doses as needed for chest pain. 01/25/19   Tommie Raymond, NP  NON FORMULARY CPAP machine with sleep.    [provider]  pantoprazole (PROTONIX) 40 MG tablet TAKE 1 TABLET BY MOUTH EVERY DAY BEFORE BREAKFAST GENERIC EQUIVALENT FOR PROTONIX Patient taking differently: Take 40 mg by mouth daily before breakfast.  12/22/17   Richardson Dopp T, PA-C  Probiotic Product (PROBIOTIC DAILY PO) Take 1 capsule by mouth daily with lunch.     [provider]  Saw Palmetto, Serenoa repens, (SAW PALMETTO PO) Take 1 capsule by mouth every evening.    [provider]  spironolactone (ALDACTONE) 25 MG tablet TAKE 1/2 TABLET(12.5 MG) BY MOUTH DAILY 01/28/19   [name], MD  tamsulosin (FLOMAX) 0.4 MG CAPS capsule Take 0.4 mg by mouth at bedtime.    [provider]  Testosterone 20 % CREA Apply 2 mLs topically daily. Rub on shoulder    [provider]  traMADol (ULTRAM) 50 MG tablet TAKE 1 TABLET BY MOUTH AT BEDTIME AS NEEDED Patient taking differently: Take 50 mg by mouth daily with lunch.  01/14/19   Bo Merino, MD   TURMERIC PO Take 1 tablet by mouth daily with lunch.     [provider]    Allergies:   Sulfonamide derivatives   Social History   Socioeconomic History  . Marital status: Married  Spouse name: Not on file  . Number of children: 1  . Years of education: Not on file  . Highest education level: Not on file  Occupational History  . Occupation: Lobbyist: [employer]  . Financial resource strain: Not on file  . Food insecurity    Worry: Not on file    Inability: Not on file  . Transportation needs    Medical: Not on file    Non-medical: Not on file  Tobacco Use  . Smoking status: Former Smoker    Quit date: 05/20/1990    Years since quitting: 28.7  . Smokeless tobacco: Never Used  . Tobacco comment: previous 30 pack year history  Substance and Sexual Activity  . Alcohol use: Yes    Comment: rare  . Drug use: Never  . Sexual activity: Not on file  Lifestyle  . Physical activity    Days per week: Not on file    Minutes per session: Not on file  . Stress: Not on file  Relationships  . Social Herbalist on phone: Not on file    Gets together: Not on file    Attends religious service: Not on file    Active member of club or organization: Not on file    Attends meetings of clubs or organizations: Not on file    Relationship status: Not on file  Other Topics Concern  . Not on file  Social History Narrative  . Not on file     Family History:  The patient's family history includes Arthritis in his brother and daughter; COPD in his brother; Cancer in his brother; Coronary artery disease in his father; Diabetes in his father; Heart disease in his father and mother; Heart failure in his brother; Hypertension in an other family member; Stomach cancer in his paternal grandmother; Sudden death in his father.   ROS:   Please see the history of present illness.    ROS All other systems reviewed and are negative.    PHYSICAL EXAM:   VS:  BP (!) 100/56   Pulse 84   Ht 5\' 7"  (1.702 m)   Wt 187 lb 6.4 oz (85 kg)   SpO2 92%   BMI 29.35 kg/m     Repeat blood pressure 120/70 GEN: Well nourished, well developed, in no acute distress  HEENT: normal  Neck: no JVD, carotid bruits, or masses Cardiac: *RRR; no murmurs, rubs, or gallops,no edema  Respiratory:  clear to auscultation bilaterally, normal work of breathing GI: soft, nontender, nondistended, + BS MS: no deformity or atrophy  Skin: warm and dry, no rash Neuro:  Alert and Oriented x 3, Strength and sensation are intact Psych: euthymic mood, full affect  Wt Readings from Last 3 Encounters:  02/09/19 187 lb 6.4 oz (85 kg)  01/25/19 185 lb 11.2 oz (84.2 kg)  01/05/19 190 lb (86.2 kg)      Studies/Labs Reviewed:   EKG:  EKG is not  ordered today.    Recent Labs: 01/24/2019: ALT 20; B Natriuretic Peptide 40.2; BUN 14; Creatinine, Ser 0.84; Hemoglobin 14.6; Platelets 250; Potassium 3.9; Sodium 136; TSH 2.402   Lipid Panel    Component Value Date/Time   CHOL 97 01/24/2019 0725   CHOL 138 11/27/2016 0914   TRIG 161 (H) 01/24/2019 0725   HDL 27 (L) 01/24/2019 0725   HDL 35 (L) 11/27/2016 0914   CHOLHDL 3.6 01/24/2019 0725  VLDL 32 01/24/2019 0725   LDLCALC 38 01/24/2019 0725   LDLCALC 65 11/27/2016 0914    Additional studies/ records that were reviewed today include:   Cardiac catheterization 01/24/2019:   Mid LAD lesion is 30% stenosed.  Non-stenotic Mid RCA lesion was previously treated.  The left ventricular systolic function is normal.  LV end diastolic pressure is normal.  The left ventricular ejection fraction is 55-65% by visual estimate.  1. Nonobstructive CAD. Continued patency of the stent in the RCA 2. Good LV systolic function 3. Normal LVEDP  Plan: will obtain an Echo. Consider a trial of colchicine. Resume Eliquis tomorrow am.   Echocardiogram 01/24/2019:  1. The left ventricle has low normal  systolic function, with an ejection fraction of 50-55%. The cavity size was normal. There is moderate concentric left ventricular hypertrophy. Left ventricular diastolic Doppler parameters are consistent with  pseudonormalization. 2. The right ventricle has normal systolic function. The cavity was normal. There is no increase in right ventricular wall thickness. 3. Trivial pericardial effusion is present.    ASSESSMENT & PLAN:    1. CAD Patent RCA stent by cath 06/2018.  Continue beta-blocker and statin.  2. PAF - On Eliquis.  No bleeding issue.  Maintaining sinus rhythm.  3. OSA on CPAP -Compliant.  4. NICM - improved LVEF with BiV pacing and optimal medical therapy -Continue Coreg, Entresto and Spironolactone.  5. HLD -Continue statin. - 01/24/2019: Cholesterol 97; HDL 27; LDL Cholesterol 38; Triglycerides 161; VLDL 32  6. RA on Plaquenil  7.  Presumed pericarditis His symptoms pleuritic in nature.  Cath without obstructive disease.  Symptoms similar when he was treated with colchicine after ICD implantation.  Could be due to sinus congestion.  Echocardiogram in hospital shows trivial pericardial effusion without tamponade physiology.  Vitals stable.  Blood pressure 120/70.  Trial of short-term colchicine as treated previously.  He will let us know if recurrent symptoms.  Check sed rate.    Medication Adjustments/Labs and Tests Ordered: Current medicines are reviewed at length with the patient today.  Concerns regarding medicines are outlined above.  Medication changes, Labs and Tests ordered today are listed in the Patient Instructions below. Patient Instructions  Medication Instructions:   TAKE COLCHINE 0.6 TWICE A DAY TWICE A DAY   If you need a refill on your cardiac medications before your next appointment, please call your pharmacy.   Lab work:  SED RATE TODAY   If you have labs (blood work) drawn today and your tests are completely normal, you will receive your  results only by: Marland Kitchen MyChart Message (if you have MyChart) OR . A paper copy in the mail If you have any lab test that is abnormal or we need to change your treatment, we will call you to review the results.  Testing/Procedures: NONE ORDERED  TODAY   Follow-Up: WITH DR CAMNITZ AS  SCHEDLED    Any Other Special Instructions Will Be Listed Below (If Applicable).       Mahalia Longest [place], [place]  02/09/2019 9:12 AM    [facility] [address], [place], [place]  [address] Phone: [phone]; Fax: [phone]

## 2019-02-09 ENCOUNTER — Ambulatory Visit: Payer: Medicare Other | Admitting: Physician Assistant

## 2019-02-09 ENCOUNTER — Other Ambulatory Visit: Payer: Self-pay

## 2019-02-09 ENCOUNTER — Ambulatory Visit (INDEPENDENT_AMBULATORY_CARE_PROVIDER_SITE_OTHER): Payer: Medicare Other | Admitting: Physician Assistant

## 2019-02-09 ENCOUNTER — Encounter: Payer: Self-pay | Admitting: Physician Assistant

## 2019-02-09 VITALS — BP 100/56 | HR 84 | Ht 67.0 in | Wt 187.4 lb

## 2019-02-09 DIAGNOSIS — I255 Ischemic cardiomyopathy: Secondary | ICD-10-CM | POA: Diagnosis not present

## 2019-02-09 DIAGNOSIS — I480 Paroxysmal atrial fibrillation: Secondary | ICD-10-CM

## 2019-02-09 DIAGNOSIS — I10 Essential (primary) hypertension: Secondary | ICD-10-CM | POA: Diagnosis not present

## 2019-02-09 DIAGNOSIS — I2510 Atherosclerotic heart disease of native coronary artery without angina pectoris: Secondary | ICD-10-CM | POA: Diagnosis not present

## 2019-02-09 DIAGNOSIS — Z95810 Presence of automatic (implantable) cardiac defibrillator: Secondary | ICD-10-CM

## 2019-02-09 DIAGNOSIS — I319 Disease of pericardium, unspecified: Secondary | ICD-10-CM

## 2019-02-09 LAB — SEDIMENTATION RATE: Sed Rate: 13 mm/hr (ref 0–30)

## 2019-02-09 MED ORDER — COLCHICINE 0.6 MG PO TABS: 0.6000 mg | ORAL_TABLET | Freq: Two times a day (BID) | ORAL | 0 refills | Status: DC

## 2019-02-09 MED ORDER — COLCHICINE 0.6 MG PO TABS: 0.6000 mg | ORAL_TABLET | Freq: Every day | ORAL | 0 refills | Status: DC

## 2019-02-09 NOTE — Patient Instructions (Addendum)
Medication Instructions:   TAKE COLCHINE 0.6 TWICE A DAY  FOR  10 DAYS  ONLY THEN  STOP   If you need a refill on your cardiac medications before your next appointment, please call your pharmacy.   Lab work:  SED RATE TODAY   If you have labs (blood work) drawn today and your tests are completely normal, you will receive your results only by: Marland Kitchen MyChart Message (if you have MyChart) OR . A paper copy in the mail If you have any lab test that is abnormal or we need to change your treatment, we will call you to review the results.  Testing/Procedures: NONE ORDERED  TODAY   Follow-Up: WITH DR CAMNITZ AS  SCHEDLED    Any Other Special Instructions Will Be Listed Below (If Applicable).

## 2019-02-10 NOTE — Progress Notes (Signed)
Pt has been made aware of normal result and verbalized understanding.  jw 02/10/2019

## 2019-02-14 ENCOUNTER — Other Ambulatory Visit: Payer: Self-pay | Admitting: Rheumatology

## 2019-02-15 ENCOUNTER — Telehealth: Payer: Self-pay | Admitting: Physician Assistant

## 2019-02-15 MED ORDER — COLCHICINE 0.6 MG PO TABS: 0.6000 mg | ORAL_TABLET | Freq: Two times a day (BID) | ORAL | 0 refills | Status: DC

## 2019-02-15 MED ORDER — COLCHICINE 0.6 MG PO TABS: 0.6000 mg | ORAL_TABLET | Freq: Every day | ORAL | 0 refills | Status: DC

## 2019-02-15 NOTE — Telephone Encounter (Signed)
New Message:    Pt wants to know how is he supposed to be taking his Colchicine? He said he was told one thing, but when he picked it up from the Pharmacy, there were different directions.

## 2019-02-15 NOTE — Telephone Encounter (Signed)
Last Visit: 12/22/18 Next Visit: 05/26/19 UDS: 12/22/18 Narc Agreement: 12/22/18  Okay to refill Tramadol?

## 2019-02-15 NOTE — Telephone Encounter (Signed)
Placed call back to pt.  He has been informed on the instructions.  He did want to let you know that since he has started the Colchicine, he has developed diarrhea?  Please advise!

## 2019-02-15 NOTE — Telephone Encounter (Signed)
Call placed to pt.  He confirms he did start Colchine with taking it bid. Pt advised to reduce it to 1 tablet daily and if the diarrhea doesn't go away, to stop it altogether and call us back and let us know and we will have to come up with a different poc.  Pt verbalized understanding.

## 2019-02-16 ENCOUNTER — Other Ambulatory Visit: Payer: Self-pay | Admitting: Cardiovascular Disease

## 2019-02-16 MED ORDER — ATORVASTATIN CALCIUM 20 MG PO TABS: 20.0000 mg | ORAL_TABLET | Freq: Every day | ORAL | 3 refills | Status: DC

## 2019-02-18 DIAGNOSIS — Z23 Encounter for immunization: Secondary | ICD-10-CM | POA: Diagnosis not present

## 2019-02-24 DIAGNOSIS — Z79899 Other long term (current) drug therapy: Secondary | ICD-10-CM | POA: Diagnosis not present

## 2019-03-09 ENCOUNTER — Other Ambulatory Visit: Payer: Self-pay | Admitting: Physician Assistant

## 2019-03-18 ENCOUNTER — Other Ambulatory Visit: Payer: Self-pay | Admitting: *Deleted

## 2019-03-18 MED ORDER — TRAMADOL HCL 50 MG PO TABS: 50.0000 mg | ORAL_TABLET | Freq: Every evening | ORAL | 0 refills | Status: DC | PRN

## 2019-03-18 NOTE — Telephone Encounter (Signed)
Refill request received via fax  Last Visit: 12/22/18 Next Visit: 05/26/19 UDS: 12/22/18 Narc Agreement: 12/22/18  Okay to refill Tramadol?

## 2019-03-30 DIAGNOSIS — M79672 Pain in left foot: Secondary | ICD-10-CM | POA: Diagnosis not present

## 2019-04-07 ENCOUNTER — Encounter: Payer: Medicare Other | Admitting: *Deleted

## 2019-04-08 ENCOUNTER — Other Ambulatory Visit: Payer: Self-pay | Admitting: Physician Assistant

## 2019-04-12 ENCOUNTER — Other Ambulatory Visit: Payer: Self-pay | Admitting: Rheumatology

## 2019-04-12 NOTE — Telephone Encounter (Signed)
Last Visit: 12/22/2018 Next Visit: 05/26/2019 UDS:12/22/2018  Narc Agreement: 12/22/2018  Last fill: 03/18/2019  Okay to refill tramadol?

## 2019-04-30 ENCOUNTER — Ambulatory Visit (INDEPENDENT_AMBULATORY_CARE_PROVIDER_SITE_OTHER): Payer: Medicare Other | Admitting: *Deleted

## 2019-04-30 DIAGNOSIS — I5022 Chronic systolic (congestive) heart failure: Secondary | ICD-10-CM

## 2019-05-01 ENCOUNTER — Telehealth: Payer: Self-pay | Admitting: Medical

## 2019-05-01 NOTE — Telephone Encounter (Signed)
I received an outpatient call about a heart alarm going off. I called the patient and he said his pacemaker (Medtronic CRT-D) went off yesterday morning and this morning at the same time. He said it was a brief consistent alarm lasting 5 seconds. He did not feel any shocks or motion from the device. When he went to read to device manual he realized his copy was in [place].  The patient says he has been feeling great, his normal self. No CP, SOB, palpitation, dizziness, etc. I let him know I will send a message to the device clinic so he can be seen Monday, and if he doesn't receive a call to please call to make an appointment to be seen.

## 2019-05-03 ENCOUNTER — Ambulatory Visit (INDEPENDENT_AMBULATORY_CARE_PROVIDER_SITE_OTHER): Payer: Medicare Other | Admitting: *Deleted

## 2019-05-03 ENCOUNTER — Other Ambulatory Visit: Payer: Self-pay

## 2019-05-03 DIAGNOSIS — I480 Paroxysmal atrial fibrillation: Secondary | ICD-10-CM

## 2019-05-03 DIAGNOSIS — Z95810 Presence of automatic (implantable) cardiac defibrillator: Secondary | ICD-10-CM | POA: Diagnosis not present

## 2019-05-03 DIAGNOSIS — I255 Ischemic cardiomyopathy: Secondary | ICD-10-CM

## 2019-05-03 LAB — CUP PACEART REMOTE DEVICE CHECK
Battery Remaining Longevity: 75 mo
Battery Voltage: 2.97 V
Brady Statistic AP VP Percent: 1.33 %
Brady Statistic AP VS Percent: 0.03 %
Brady Statistic AS VP Percent: 94.26 %
Brady Statistic AS VS Percent: 4.38 %
Brady Statistic RA Percent Paced: 1.33 %
Brady Statistic RV Percent Paced: 26.73 %
Date Time Interrogation Session: 20201212082955
HighPow Impedance: 61 Ohm
Implantable Lead Implant Date: 20181108
Implantable Lead Implant Date: 20181108
Implantable Lead Implant Date: 20181108
Implantable Lead Location: 753858
Implantable Lead Location: 753859
Implantable Lead Location: 753860
Implantable Lead Model: 4298
Implantable Lead Model: 5076
Implantable Pulse Generator Implant Date: 20181108
Lead Channel Impedance Value: 1026 Ohm
Lead Channel Impedance Value: 1064 Ohm
Lead Channel Impedance Value: 175.622
Lead Channel Impedance Value: 188.1 Ohm
Lead Channel Impedance Value: 232.071
Lead Channel Impedance Value: 240.667
Lead Channel Impedance Value: 264.733
Lead Channel Impedance Value: 342 Ohm
Lead Channel Impedance Value: 361 Ohm
Lead Channel Impedance Value: 418 Ohm
Lead Channel Impedance Value: 418 Ohm
Lead Channel Impedance Value: 532 Ohm
Lead Channel Impedance Value: 608 Ohm
Lead Channel Impedance Value: 608 Ohm
Lead Channel Impedance Value: 646 Ohm
Lead Channel Impedance Value: 665 Ohm
Lead Channel Impedance Value: 722 Ohm
Lead Channel Impedance Value: 988 Ohm
Lead Channel Pacing Threshold Amplitude: 0.5 V
Lead Channel Pacing Threshold Amplitude: 0.625 V
Lead Channel Pacing Threshold Amplitude: 2.5 V
Lead Channel Pacing Threshold Pulse Width: 0.4 ms
Lead Channel Pacing Threshold Pulse Width: 0.4 ms
Lead Channel Pacing Threshold Pulse Width: 1 ms
Lead Channel Sensing Intrinsic Amplitude: 12.125 mV
Lead Channel Sensing Intrinsic Amplitude: 12.125 mV
Lead Channel Sensing Intrinsic Amplitude: 3 mV
Lead Channel Sensing Intrinsic Amplitude: 3 mV
Lead Channel Setting Pacing Amplitude: 2 V
Lead Channel Setting Pacing Amplitude: 2.5 V
Lead Channel Setting Pacing Amplitude: 2.5 V
Lead Channel Setting Pacing Pulse Width: 0.4 ms
Lead Channel Setting Pacing Pulse Width: 1 ms
Lead Channel Setting Sensing Sensitivity: 0.3 mV

## 2019-05-03 NOTE — Telephone Encounter (Signed)
Will address with patient at DC appointment on 05/03/19.

## 2019-05-03 NOTE — Telephone Encounter (Signed)
Spoke with patient. MDT CRT-D has been alarming every day since Friday at 0700. Reviewed manual transmission from 05/01/19 at 08:29. Alert tone is due to unsuccessful Carelink alert transmission, alert for AT/AF >=6hrs duration for 3 days. Known PAF, on Eliquis. Presenting rhythm as of 05/01/19 is AS/LVP in the 90s.  Patient agreeable to DC appointment today at 3:00pm. Will reprogram alert for AT/AF burden.

## 2019-05-03 NOTE — Patient Instructions (Signed)
Raquel Sarna, RN, will send your device report to Dr. Curt Bears for review and recommendations regarding your A-fib.

## 2019-05-04 ENCOUNTER — Telehealth: Payer: Self-pay | Admitting: *Deleted

## 2019-05-04 LAB — CUP PACEART INCLINIC DEVICE CHECK
Battery Remaining Longevity: 65 mo
Battery Voltage: 2.97 V
Brady Statistic AP VP Percent: 1.31 %
Brady Statistic AP VS Percent: 0.03 %
Brady Statistic AS VP Percent: 94.3 %
Brady Statistic AS VS Percent: 4.37 %
Brady Statistic RA Percent Paced: 1.3 %
Brady Statistic RV Percent Paced: 26.73 %
Date Time Interrogation Session: 20201214185358
HighPow Impedance: 68 Ohm
Implantable Lead Implant Date: 20181108
Implantable Lead Implant Date: 20181108
Implantable Lead Implant Date: 20181108
Implantable Lead Location: 753858
Implantable Lead Location: 753859
Implantable Lead Location: 753860
Implantable Lead Model: 4298
Implantable Lead Model: 5076
Implantable Pulse Generator Implant Date: 20181108
Lead Channel Impedance Value: 1121 Ohm
Lead Channel Impedance Value: 1140 Ohm
Lead Channel Impedance Value: 1178 Ohm
Lead Channel Impedance Value: 204.14 Ohm
Lead Channel Impedance Value: 216.848
Lead Channel Impedance Value: 263.855
Lead Channel Impedance Value: 272.032
Lead Channel Impedance Value: 295.076
Lead Channel Impedance Value: 399 Ohm
Lead Channel Impedance Value: 418 Ohm
Lead Channel Impedance Value: 418 Ohm
Lead Channel Impedance Value: 475 Ohm
Lead Channel Impedance Value: 589 Ohm
Lead Channel Impedance Value: 608 Ohm
Lead Channel Impedance Value: 665 Ohm
Lead Channel Impedance Value: 703 Ohm
Lead Channel Impedance Value: 779 Ohm
Lead Channel Impedance Value: 817 Ohm
Lead Channel Pacing Threshold Amplitude: 0.5 V
Lead Channel Pacing Threshold Amplitude: 0.75 V
Lead Channel Pacing Threshold Amplitude: 2.25 V
Lead Channel Pacing Threshold Pulse Width: 0.4 ms
Lead Channel Pacing Threshold Pulse Width: 0.4 ms
Lead Channel Pacing Threshold Pulse Width: 1 ms
Lead Channel Sensing Intrinsic Amplitude: 14.5 mV
Lead Channel Sensing Intrinsic Amplitude: 3.125 mV
Lead Channel Setting Pacing Amplitude: 2 V
Lead Channel Setting Pacing Amplitude: 2.5 V
Lead Channel Setting Pacing Amplitude: 2.75 V
Lead Channel Setting Pacing Pulse Width: 0.4 ms
Lead Channel Setting Pacing Pulse Width: 1 ms
Lead Channel Setting Sensing Sensitivity: 0.3 mV

## 2019-05-04 NOTE — Telephone Encounter (Signed)
Per Dr. Curt Bears, plan to offer AF Clinic f/u to discuss rhythm control due to AF episode 46+hrs duration between 12/6-12/9. Pt in agreement with plan, requests an appointment the week after Christmas as he will be going out of town. Routed to scheduler for assistance setting up appointment.  Discussed weight gain and LE edema that patient reported at 12/14 DC appointment. Pt reports "the problem with my weight is my mouth", reports he has been eating too much over the past few months. Denies SOB or LE edema at this time. Pt reports he weighs daily, aware to call for 2lb weight gain overnight or 5lbs in a week, monitor for edema and SOB. Message sent to Sharman Cheek, RN, to enroll patient in [facility] as per discussion on 12/14. Pt denies any additional concerns at this time and thanked me for my call.

## 2019-05-04 NOTE — Progress Notes (Signed)
Discussed with Dr. Lavonna Monarch to refer patient to AF Clinic, or f/u with Dr. Curt Bears next available. See phone note from 05/04/19.

## 2019-05-04 NOTE — Telephone Encounter (Signed)
Called and spoke with patient.  He is aware of appt 05/18/2019 @3  pm.

## 2019-05-04 NOTE — Progress Notes (Addendum)
CRT-D device check in office, added-on due to auditory alert tone for unsuccessful Carelink transmission. Thresholds and sensing consistent with previous device measurements. LV threshold 2.25V @ 1.40ms, output increased to 2.75V @ 1.82ms (monitor only). Lead impedance trends stable over time. 4 AT/AF episodes (2.5% burden), episodes occurred between 12/6-12/9, longest 46.5hrs (CareAlert triggered for >6hrs), on Eliquis. V rates elevated at times and recorded as SVT-AF episodes. Patient reports that in retrospect, he did feel poorly those dates. No ventricular arrhythmia episodes recorded. Patient bi-ventricularly pacing 95.1% (effective 94.2%). Device programmed with appropriate safety margins; AT/AF daily burden time reprogrammed to 24hrs, monitor alert for therapy set exhausted enabled. OptiVol overall stable, recent dip in thoracic impedance, pt reports some BLE edema, as well as increased exertional SOB over the past few months and ~30lb weight gain since 07/2018. Will discuss with Dr. Curt Bears. Reports compliance with spironolactone and daily weights. Pt interested in [facility], will enroll. Audible alerts demonstrated for patient. Estimated longevity 5.6 years. Patient enrolled in remote follow up. Patient education completed including shock plan. Carelink on 07/29/19 and ROV with Dr. Curt Bears in 06/2019.

## 2019-05-11 ENCOUNTER — Telehealth: Payer: Self-pay

## 2019-05-11 NOTE — Telephone Encounter (Signed)
Patient referred to [facility] clinic by Levander Campion, RN Device clinic and is patient of Dr Curt Bears.  ICM intro given and patient agreed to monthly follow up. 05/02/2019 remote transmission showed possible fluid accumulation and patient voiced that he did have a little weight gain and slight swelling of feet/legs at that time but has since resolved.  Provided ICM number and encouraged to call if experiencing any fluid symptoms.  ICM remote transmission scheduled for 05/31/2019.

## 2019-05-18 ENCOUNTER — Other Ambulatory Visit: Payer: Self-pay

## 2019-05-18 ENCOUNTER — Encounter (HOSPITAL_COMMUNITY): Payer: Self-pay | Admitting: Nurse Practitioner

## 2019-05-18 ENCOUNTER — Ambulatory Visit (HOSPITAL_COMMUNITY)
Admission: RE | Admit: 2019-05-18 | Discharge: 2019-05-18 | Disposition: A | Discharge status: Home / Self Care | Payer: Medicare Other | Source: Ambulatory Visit | Attending: Nurse Practitioner | Admitting: Nurse Practitioner

## 2019-05-18 VITALS — BP 126/76 | HR 72 | Ht 67.0 in | Wt 194.2 lb

## 2019-05-18 DIAGNOSIS — Z7901 Long term (current) use of anticoagulants: Secondary | ICD-10-CM | POA: Insufficient documentation

## 2019-05-18 DIAGNOSIS — Z95810 Presence of automatic (implantable) cardiac defibrillator: Secondary | ICD-10-CM | POA: Diagnosis not present

## 2019-05-18 DIAGNOSIS — Z87891 Personal history of nicotine dependence: Secondary | ICD-10-CM | POA: Diagnosis not present

## 2019-05-18 DIAGNOSIS — G4733 Obstructive sleep apnea (adult) (pediatric): Secondary | ICD-10-CM | POA: Insufficient documentation

## 2019-05-18 DIAGNOSIS — I480 Paroxysmal atrial fibrillation: Secondary | ICD-10-CM

## 2019-05-18 DIAGNOSIS — I110 Hypertensive heart disease with heart failure: Secondary | ICD-10-CM | POA: Diagnosis not present

## 2019-05-18 DIAGNOSIS — Z882 Allergy status to sulfonamides status: Secondary | ICD-10-CM | POA: Diagnosis not present

## 2019-05-18 DIAGNOSIS — I509 Heart failure, unspecified: Secondary | ICD-10-CM | POA: Diagnosis not present

## 2019-05-18 DIAGNOSIS — Z8249 Family history of ischemic heart disease and other diseases of the circulatory system: Secondary | ICD-10-CM | POA: Diagnosis not present

## 2019-05-18 DIAGNOSIS — K219 Gastro-esophageal reflux disease without esophagitis: Secondary | ICD-10-CM | POA: Diagnosis not present

## 2019-05-18 DIAGNOSIS — M1990 Unspecified osteoarthritis, unspecified site: Secondary | ICD-10-CM

## 2019-05-18 DIAGNOSIS — Z833 Family history of diabetes mellitus: Secondary | ICD-10-CM | POA: Insufficient documentation

## 2019-05-18 DIAGNOSIS — R7303 Prediabetes: Secondary | ICD-10-CM | POA: Diagnosis not present

## 2019-05-18 DIAGNOSIS — E785 Hyperlipidemia, unspecified: Secondary | ICD-10-CM | POA: Diagnosis not present

## 2019-05-18 DIAGNOSIS — D6869 Other thrombophilia: Secondary | ICD-10-CM | POA: Diagnosis not present

## 2019-05-18 DIAGNOSIS — I2510 Atherosclerotic heart disease of native coronary artery without angina pectoris: Secondary | ICD-10-CM | POA: Diagnosis not present

## 2019-05-18 DIAGNOSIS — Z79899 Other long term (current) drug therapy: Secondary | ICD-10-CM | POA: Diagnosis not present

## 2019-05-18 MED ORDER — TRAMADOL HCL 50 MG PO TABS: ORAL_TABLET | ORAL | 0 refills | Status: DC

## 2019-05-18 MED ORDER — HYDROXYCHLOROQUINE SULFATE 200 MG PO TABS: ORAL_TABLET | ORAL | 0 refills | Status: DC

## 2019-05-18 NOTE — Telephone Encounter (Signed)
Refill request received via fax from [facility] [address] for PLQ and tramadol.   Last Visit: 12/22/2018 Next Visit: 05/26/2019 Labs: 01/24/2019 CO2 21, glucose 142, total bilirubin 1.5 Eye exam: 02/23/2018  UDS: 12/22/2018  Narc agreement: 12/22/2018 Last fill: 04/12/2019  Attempted to contact patient and left message on machine to advise patient we need updated PLQ eye exam.   Okay to refill 30 day supply of PLQ and tramadol?

## 2019-05-18 NOTE — Telephone Encounter (Signed)
Both medications have been sent.

## 2019-05-18 NOTE — Telephone Encounter (Signed)
Ok.  I feel tramadol.  I am uncertain if Plaquenil went through.  Please check.

## 2019-05-19 ENCOUNTER — Encounter (HOSPITAL_COMMUNITY): Payer: Self-pay | Admitting: Nurse Practitioner

## 2019-05-19 NOTE — Progress Notes (Signed)
Primary Care Physician: Biagio Borg, MD Referring Physician:Dr. Ileana Ladd Cardiologist: Dr. Simona Huh is a 73 y.o. male with a h/o RA,  paroxysmal afib. CHF with ICD in place, CHF, CAD s/p stents RCA in 2005, OSA, that is in the afib clinic to discuss rhythm control as his device recently reported that he had 46 hours of afib 12/6 to 12/9 at 2.5 % burden with elevated v rates at times. Pt was unaware at the time but then on retrospect he remembered that he felt more tired those days. He is all ready on coreg 25 mg bid. CHA2DS2VASc score is of at least 4 and is on eliquis 5 mg bid. He is A sensed and V paced today. He states that he eating has been out of control for several months and has gained around 15 lbs. He does not feel this is fluid but true weight gain.   Today, he denies symptoms of palpitations, chest pain, shortness of breath, orthopnea, PND, lower extremity edema, dizziness, presyncope, syncope, or neurologic sequela. The patient is tolerating medications without difficulties and is otherwise without complaint today.   Past Medical History:  Diagnosis Date  . AICD (automatic cardioverter/defibrillator) present   . Allergic rhinitis   . Allergy   . Anginal pain ([facility]) 05/26/14   chest pain after chasing dog  . Atypical nevus of back 04/27/2003   moderate - mid lower back  . Basal cell carcinoma 01/26/2008   right cheek - MOHs  . CAD (coronary artery disease)    hx of stent- 2005 RCA  . Cataract    beginning stage both eyes  . CHF (congestive heart failure) (HCC)    pacemaker Medtronic    . Chronic back pain    intermittent  . Constipation   . Dizziness   . Dysrhythmia    right bundle branch block   . Esophageal stricture   . GERD (gastroesophageal reflux disease)   . Hemoptysis   . Hiatal hernia   . History of colonic polyps    hyperplastic  . HTN (hypertension)   . Hyperlipidemia   . Hypertrophy of prostate with urinary obstruction and other lower  urinary tract symptoms (LUTS)   . OA (osteoarthritis)   . OSA (obstructive sleep apnea)    cpap- 10   . 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 Pneumonia   . Pre-diabetes    no medications  . Shortness of breath    with exertion  . Sleep apnea    cpap  . Testicular hypofunction    Past Surgical History:  Procedure Laterality Date  . BIV ICD INSERTION CRT-D N/A 03/27/2017   Procedure: BIV ICD INSERTION CRT-D;  Surgeon: Constance Haw, MD;  Location: [facility];  Service: Cardiovascular;  Laterality: N/A;  . CARDIAC CATHETERIZATION     '05, last 2009, showing patent RCA stent  . COLONOSCOPY  12/2007   HYPERPLASTIC POLYP  . COLONOSCOPY WITH PROPOFOL N/A 06/02/2014   Procedure: COLONOSCOPY WITH PROPOFOL;  Surgeon: [name], MD;  Location: [facility];  Service: Endoscopy;  Laterality: N/A;  . CORONARY ANGIOPLASTY  08/2003  . ESOPHAGOGASTRODUODENOSCOPY (EGD) WITH PROPOFOL N/A 06/02/2014   Procedure: ESOPHAGOGASTRODUODENOSCOPY (EGD) WITH PROPOFOL;  Surgeon: [name], MD;  Location: [facility];  Service: Endoscopy;  Laterality: N/A;  . hernia surgery x 3     Bilateral Inguinal,  Umbicial  . INSERTION OF MESH N/A 03/20/2018   Procedure: INSERTION OF MESH;  Surgeon: Alphonsa Overall, MD;  Location: [facility];  Service: General;  Laterality: N/A;  . IRRIGATION AND DEBRIDEMENT ABSCESS Left 08/14/2012   Procedure: IRRIGATION AND DEBRIDEMENT LEFT INGUINAL BOIL ;  Surgeon: Ailene Rud, MD;  Location: [facility];  Service: Urology;  Laterality: Left;  . JOINT REPLACEMENT Right 2017  . KNEE ARTHROPLASTY    . LEFT HEART CATH AND CORONARY ANGIOGRAPHY N/A 01/24/2019   Procedure: LEFT HEART CATH AND CORONARY ANGIOGRAPHY;  Surgeon: Martinique, Peter M, MD;  Location: [facility];  Service: Cardiovascular;  Laterality: N/A;  . LEFT HEART CATHETERIZATION WITH CORONARY ANGIOGRAM N/A 05/26/2014    Procedure: LEFT HEART CATHETERIZATION WITH CORONARY ANGIOGRAM;  Surgeon: Blane Ohara, MD;  Location: [facility];  Service: Cardiovascular;  Laterality: N/A;  . LUMBAR LAMINECTOMY/DECOMPRESSION MICRODISCECTOMY Left 09/26/2014   Procedure: Lumbar Laminectomy for resection of synovial cyst  Lumbar five- sacral one left;  Surgeon: Kary Kos, MD;  Location: [facility];  Service: Neurosurgery;  Laterality: Left;  . MOLE REMOVAL Left 03/20/2018   Procedure: MOLE REMOVAL;  Surgeon: Alphonsa Overall, MD;  Location: [facility];  Service: General;  Laterality: Left;  . Oral surg to removed growth from ?sinus  10/2010   ?Dermoid removed by DrRiggs  . PACEMAKER INSERTION  04/03/2017  . POLYPECTOMY    . RCA stenting     '05 RCA  . right toe surgery  Right    Cyst   . RIGHT/LEFT HEART CATH AND CORONARY ANGIOGRAPHY N/A 01/15/2017   Procedure: RIGHT/LEFT HEART CATH AND CORONARY ANGIOGRAPHY;  Surgeon: [name], MD;  Location: [facility];  Service: Cardiovascular;  Laterality: N/A;  . s/p right knee arthroscopy  2005  . thumb surgery Right   . TOTAL KNEE ARTHROPLASTY Right 03/12/2016   Procedure: RIGHT TOTAL KNEE ARTHROPLASTY;  Surgeon: Paralee Cancel, MD;  Location: [facility];  Service: Orthopedics;  Laterality: Right;  . UPPER GASTROINTESTINAL ENDOSCOPY    . VENTRAL HERNIA REPAIR N/A 03/20/2018   Procedure: [place];  Surgeon: Alphonsa Overall, MD;  Location: [facility];  Service: General;  Laterality: N/A;    Current Outpatient Medications  Medication Sig Dispense Refill  . acetaminophen (TYLENOL) 500 MG tablet Take 500 mg by mouth 2 (two) times daily.    Marland Kitchen atorvastatin (LIPITOR) 20 MG tablet Take 1 tablet (20 mg total) by mouth daily. 90 tablet 3  . b complex vitamins tablet Take 1 tablet by mouth daily with lunch.     . calcium carbonate (OSCAL) 1500 (600 Ca) MG TABS tablet Take 600 mg by mouth every evening.    . carvedilol (COREG) 25 MG tablet TAKE 1 TABLET  BY MOUTH TWICE DAILY 180 tablet 2  . CINNAMON PO Take 1 tablet by mouth daily with lunch.     . Coenzyme Q10 (CO Q 10) 100 MG CAPS Take 1 capsule by mouth daily with lunch.     . colchicine 0.6 MG tablet Take 1 tablet (0.6 mg total) by mouth daily. 30 tablet 0  . diclofenac sodium (VOLTAREN) 1 % GEL Apply 1 application topically 4 (four) times daily as needed (pain).   3  . ELIQUIS 5 MG TABS tablet TAKE 1 TABLET(5 MG) BY MOUTH TWICE DAILY 60 tablet 2  . ENTRESTO 49-51 MG TAKE 1 TABLET BY MOUTH 2 TIMES DAILY 99991111 tablet 2  . folic acid (FOLVITE)  400 MCG tablet Take 400 mcg by mouth daily.     . Glucosamine HCl (GLUCOSAMINE PO) Take 1,500 mg by mouth every evening.    . hydrocortisone 2.5 % cream Apply 1 application topically 2 (two) times daily as needed (rash on nose).     . hydroxychloroquine (PLAQUENIL) 200 MG tablet TAKE 1 TABLET BY MOUTH TWICE DAILY MONDAY THROUGH FRIDAY ONLY. DO NOT TAKE SATURDAY OR SUNDAY 40 tablet 0  . hydroxypropyl methylcellulose / hypromellose (ISOPTO TEARS / GONIOVISC) 2.5 % ophthalmic solution Place 1 drop into both eyes 4 (four) times daily as needed for dry eyes.     . Multiple Vitamin (MULTIVITAMIN) capsule Take 1 capsule by mouth daily.      . Multiple Vitamins-Minerals (OCUVITE PO) Take 1 tablet by mouth at bedtime.    . nitroGLYCERIN (NITROSTAT) 0.4 MG SL tablet Place 1 tablet (0.4 mg total) under the tongue every 5 (five) minutes x 3 doses as needed for chest pain. 25 tablet 0  . NON FORMULARY CPAP machine with sleep.    . pantoprazole (PROTONIX) 40 MG tablet TAKE 1 TABLET BY MOUTH EVERY DAY BEFORE BREAKFAST GENERIC EQUIVALENT FOR PROTONIX 90 tablet 3  . Probiotic Product (PROBIOTIC DAILY PO) Take 1 capsule by mouth daily with lunch.     . Saw Palmetto, Serenoa repens, (SAW PALMETTO PO) Take 1 capsule by mouth every evening.    Marland Kitchen spironolactone (ALDACTONE) 25 MG tablet TAKE 1/2 TABLET(12.5 MG) BY MOUTH DAILY 45 tablet 3  . tamsulosin (FLOMAX) 0.4 MG CAPS capsule  Take 0.4 mg by mouth at bedtime.    . Testosterone 20 % CREA Apply 2 mLs topically daily. Rub on shoulder    . traMADol (ULTRAM) 50 MG tablet TAKE 1 TABLET(50 MG) BY MOUTH AT BEDTIME AS NEEDED 30 tablet 0  . TURMERIC PO Take 1 tablet by mouth daily with lunch.      No current facility-administered medications for this encounter.    Allergies  Allergen Reactions  . Sulfonamide Derivatives Rash         Social History   Socioeconomic History  . Marital status: Married    Spouse name: Not on file  . Number of children: 1  . Years of education: Not on file  . Highest education level: Not on file  Occupational History  . Occupation: Lobbyist: [employer]  Tobacco Use  . Smoking status: Former Smoker    Quit date: 05/20/1990    Years since quitting: 29.0  . Smokeless tobacco: Never Used  . Tobacco comment: previous 30 pack year history  Substance and Sexual Activity  . Alcohol use: Yes    Comment: rare  . Drug use: Never  . Sexual activity: Not on file  Other Topics Concern  . Not on file  Social History Narrative  . Not on file   Social Determinants of Health   Financial Resource Strain:   . Difficulty of Paying Living Expenses: Not on file  Food Insecurity:   . Worried About Charity fundraiser in the Last Year: Not on file  . Ran Out of Food in the Last Year: Not on file  Transportation Needs:   . Lack of Transportation (Medical): Not on file  . Lack of Transportation (Non-Medical): Not on file  Physical Activity:   . Days of Exercise per Week: Not on file  . Minutes of Exercise per Session: Not on file  Stress:   . Feeling of  Stress : Not on file  Social Connections:   . Frequency of Communication with Friends and Family: Not on file  . Frequency of Social Gatherings with Friends and Family: Not on file  . Attends Religious Services: Not on file  . Active Member of Clubs or Organizations: Not on file  . Attends Archivist  Meetings: Not on file  . Marital Status: Not on file  Intimate Partner Violence:   . Fear of Current or Ex-Partner: Not on file  . Emotionally Abused: Not on file  . Physically Abused: Not on file  . Sexually Abused: Not on file    Family History  Problem Relation Age of Onset  . Coronary artery disease Father   . Diabetes Father   . Sudden death Father        due to heart disease  . Heart disease Father   . Heart disease Mother   . Hypertension Other   . Stomach cancer Paternal Grandmother   . COPD Brother   . Arthritis Brother   . Heart failure Brother   . Cancer Brother        lung   . Arthritis Daughter   . Colon cancer Neg Hx   . Esophageal cancer Neg Hx   . Colon polyps Neg Hx   . Ulcerative colitis Neg Hx     ROS- All systems are reviewed and negative except as per the HPI above  Physical Exam: Vitals:   05/18/19 1523  BP: 126/76  Pulse: 72  Weight: 88.1 kg  Height: 5\' 7"  (1.702 m)   Wt Readings from Last 3 Encounters:  05/18/19 88.1 kg  02/09/19 85 kg  01/25/19 84.2 kg    Labs: Lab Results  Component Value Date   NA 136 01/24/2019   K 3.9 01/24/2019   CL 104 01/24/2019   CO2 21 (L) 01/24/2019   GLUCOSE 142 (H) 01/24/2019   BUN 14 01/24/2019   CREATININE 0.84 01/24/2019   CALCIUM 9.4 01/24/2019   MG 2.1 11/27/2016   Lab Results  Component Value Date   INR 1.3 (H) 01/24/2019   Lab Results  Component Value Date   CHOL 97 01/24/2019   HDL 27 (L) 01/24/2019   LDLCALC 38 01/24/2019   TRIG 161 (H) 01/24/2019     GEN- The patient is well appearing, alert and oriented x 3 today.   Head- normocephalic, atraumatic Eyes-  Sclera clear, conjunctiva pink Ears- hearing intact Oropharynx- clear Neck- supple, no JVP Lymph- no cervical lymphadenopathy Lungs- Clear to ausculation bilaterally, normal work of breathing Heart- Regular rate and rhythm, no murmurs, rubs or gallops, PMI not laterally displaced GI- soft, NT, ND, + BS Extremities- no  clubbing, cyanosis, or edema MS- no significant deformity or atrophy Skin- no rash or lesion Psych- euthymic mood, full affect Neuro- strength and sensation are intact  EKG-atrial sensed and v paced at 72 bpm Epic records reviewed     Assessment and Plan: 1. Paroxysmal afib Discussed afib management with pt including rhythm control and best options for him including amiodarone, sotalol or tikosyn He does not care for the side effect profile of amiodarone and does not really want to add any more drugs as he is on so  many at this time and goes into the do-nut hole in June, with tikosyn, he would hit much earlier. He also is looking to travel in [place] when covid  restrictions are lifted and does not want to worry about such tight  restrictions re timing of meds He has f/u with Dr. Curt Bears in February and will discuss further then. Possibly, he could be front line ablation candidate and avoid antiarrythmic's Continue carvedilol 25 mg bid  Encouraged to eat healthier diet and get more exercise for weight loss   afib triggers reviewed   2. CHA2DS2VASc of at leat 4 Continue eliquis 5 mg bid    3. CAD No anginal symptoms  4. CHF Appears  Normovolemic  F/u with Dr. Curt Bears abnd Dr. Burt Knack in February per recall   Geroge Baseman. [facility] [address] [place], [place] [address] [phone]  afib clinic as needed

## 2019-05-26 ENCOUNTER — Ambulatory Visit: Payer: Medicare Other | Admitting: Rheumatology

## 2019-05-31 ENCOUNTER — Ambulatory Visit (INDEPENDENT_AMBULATORY_CARE_PROVIDER_SITE_OTHER): Payer: Medicare Other

## 2019-05-31 DIAGNOSIS — Z95810 Presence of automatic (implantable) cardiac defibrillator: Secondary | ICD-10-CM

## 2019-05-31 DIAGNOSIS — I5022 Chronic systolic (congestive) heart failure: Secondary | ICD-10-CM

## 2019-05-31 NOTE — Progress Notes (Signed)
EPIC Encounter for ICM Monitoring  Patient Name: [name] is a 74 y.o. male Date: 05/31/2019 Primary Care Physican: Biagio Borg, MD Primary Cardiologist: Burt Knack Electrophysiologist: Curt Bears Bi-V Pacing:   95.9%  05/31/2019 Weight: 194 lbs        1st remote transmission. Heart Failure questions reviewed.  Pt asymptomatic and doing well.  He received his first COVID injection last week and tolerated well.    Optivol thoracic impedance normal.   Takes Spirolactone 25 mg take 0.5 tablet daily.  Recommendations: No changes and encouraged to call if experiencing any fluid symptoms.  Follow-up plan: ICM clinic phone appointment on 07/05/2019.   91 day device clinic remote transmission 07/29/2019.  Office appt 06/25/2019 with Dr. Burt Knack and 07/13/2019 with Dr Curt Bears.    Copy of ICM check sent to Dr. Curt Bears.   3 month ICM trend: 05/31/2019    1 Year ICM trend:       [name], RN 05/31/2019 4:44 PM

## 2019-06-14 ENCOUNTER — Other Ambulatory Visit: Payer: Self-pay | Admitting: *Deleted

## 2019-06-14 DIAGNOSIS — M1990 Unspecified osteoarthritis, unspecified site: Secondary | ICD-10-CM

## 2019-06-14 MED ORDER — HYDROXYCHLOROQUINE SULFATE 200 MG PO TABS: ORAL_TABLET | ORAL | 0 refills | Status: DC

## 2019-06-14 NOTE — Telephone Encounter (Signed)
Received PLQ eye exam for 02/24/19 WNL

## 2019-06-14 NOTE — Telephone Encounter (Signed)
Please try to contact his ophthalmology office to obtain records today.  If we obtain records of recent eye exam and it was normal, ok to send in 90-day supply of PLQ.

## 2019-06-14 NOTE — Telephone Encounter (Signed)
Refill request received via fax  Last Visit: 12/22/2018 Next Visit: 09/01/19 Labs: 01/24/19 Total bilirubin 1.5, glucose 142 CO2 21 Eye exam: 02/23/2018 normal.   Patient advised he is due update PLQ eye exam. Patient states he has updated and will have the eye doctor fax results.   Okay to refill 30 day supply Plaquenil?

## 2019-06-15 ENCOUNTER — Other Ambulatory Visit: Payer: Self-pay | Admitting: Cardiology

## 2019-06-15 MED ORDER — CARVEDILOL 25 MG PO TABS: 25.0000 mg | ORAL_TABLET | Freq: Two times a day (BID) | ORAL | 2 refills | Status: DC

## 2019-06-21 ENCOUNTER — Other Ambulatory Visit: Payer: Self-pay | Admitting: *Deleted

## 2019-06-21 MED ORDER — TRAMADOL HCL 50 MG PO TABS: ORAL_TABLET | ORAL | 0 refills | Status: DC

## 2019-06-21 NOTE — Telephone Encounter (Signed)
Refill request received via fax  Last Visit: 12/22/2018 Next Visit: 09/01/19 UDS: 12/22/18 NArc Agreement: 12/22/18   Patient advised he is due to update UDS and narc agreement.   Okay to refill Tramadol?

## 2019-06-22 ENCOUNTER — Other Ambulatory Visit: Payer: Self-pay | Admitting: *Deleted

## 2019-06-22 DIAGNOSIS — Z79899 Other long term (current) drug therapy: Secondary | ICD-10-CM

## 2019-06-22 DIAGNOSIS — Z5181 Encounter for therapeutic drug level monitoring: Secondary | ICD-10-CM | POA: Diagnosis not present

## 2019-06-24 LAB — COMPLETE METABOLIC PANEL WITH GFR
AG Ratio: 2 (calc) (ref 1.0–2.5)
ALT: 21 U/L (ref 9–46)
AST: 22 U/L (ref 10–35)
Albumin: 4.3 g/dL (ref 3.6–5.1)
Alkaline phosphatase (APISO): 64 U/L (ref 35–144)
BUN: 15 mg/dL (ref 7–25)
CO2: 25 mmol/L (ref 20–32)
Calcium: 9.7 mg/dL (ref 8.6–10.3)
Chloride: 103 mmol/L (ref 98–110)
Creat: 0.93 mg/dL (ref 0.70–1.18)
GFR, Est African American: 93 mL/min/{1.73_m2} (ref >=60)
GFR, Est Non African American: 80 mL/min/{1.73_m2} (ref >=60)
Globulin: 2.2 g/dL (calc) (ref 1.9–3.7)
Glucose, Bld: 136 mg/dL — ABNORMAL HIGH (ref 65–99)
Potassium: 3.9 mmol/L (ref 3.5–5.3)
Sodium: 137 mmol/L (ref 135–146)
Total Bilirubin: 0.9 mg/dL (ref 0.2–1.2)
Total Protein: 6.5 g/dL (ref 6.1–8.1)

## 2019-06-24 LAB — PAIN MGMT, PROFILE 5 W/CONF, U
Amphetamines: NEGATIVE ng/mL
Barbiturates: NEGATIVE ng/mL
Benzodiazepines: NEGATIVE ng/mL
Cocaine Metabolite: NEGATIVE ng/mL
Creatinine: 202.1 mg/dL
Marijuana Metabolite: NEGATIVE ng/mL
Methadone Metabolite: NEGATIVE ng/mL
Opiates: NEGATIVE ng/mL
Oxidant: NEGATIVE ug/mL
Oxycodone: NEGATIVE ng/mL
pH: 5.3 (ref 4.5–9.0)

## 2019-06-24 LAB — CBC WITH DIFFERENTIAL/PLATELET
Absolute Monocytes: 499 cells/uL (ref 200–950)
Basophils Absolute: 48 cells/uL (ref 0–200)
Basophils Relative: 1 %
Eosinophils Absolute: 139 cells/uL (ref 15–500)
Eosinophils Relative: 2.9 %
HCT: 44 % (ref 38.5–50.0)
Hemoglobin: 14.8 g/dL (ref 13.2–17.1)
Lymphs Abs: 1123 cells/uL (ref 850–3900)
MCH: 30.1 pg (ref 27.0–33.0)
MCHC: 33.6 g/dL (ref 32.0–36.0)
MCV: 89.6 fL (ref 80.0–100.0)
MPV: 10 fL (ref 7.5–12.5)
Monocytes Relative: 10.4 %
Neutro Abs: 2990 cells/uL (ref 1500–7800)
Neutrophils Relative %: 62.3 %
Platelets: 247 10*3/uL (ref 140–400)
RBC: 4.91 10*6/uL (ref 4.20–5.80)
RDW: 12.7 % (ref 11.0–15.0)
Total Lymphocyte: 23.4 %
WBC: 4.8 10*3/uL (ref 3.8–10.8)

## 2019-06-24 LAB — PAIN MGMT, TRAMADOL W/MEDMATCH, U
Desmethyltramadol: 2789 ng/mL
Tramadol: 2701 ng/mL

## 2019-06-24 NOTE — Progress Notes (Signed)
UDS is consistent.

## 2019-06-25 ENCOUNTER — Ambulatory Visit (INDEPENDENT_AMBULATORY_CARE_PROVIDER_SITE_OTHER): Payer: Medicare Other | Admitting: Cardiovascular Disease

## 2019-06-25 ENCOUNTER — Encounter: Payer: Self-pay | Admitting: Cardiovascular Disease

## 2019-06-25 ENCOUNTER — Other Ambulatory Visit: Payer: Self-pay

## 2019-06-25 VITALS — BP 128/68 | HR 72 | Ht 67.0 in | Wt 189.2 lb

## 2019-06-25 DIAGNOSIS — R739 Hyperglycemia, unspecified: Secondary | ICD-10-CM

## 2019-06-25 DIAGNOSIS — I480 Paroxysmal atrial fibrillation: Secondary | ICD-10-CM

## 2019-06-25 DIAGNOSIS — I5022 Chronic systolic (congestive) heart failure: Secondary | ICD-10-CM | POA: Diagnosis not present

## 2019-06-25 DIAGNOSIS — I2510 Atherosclerotic heart disease of native coronary artery without angina pectoris: Secondary | ICD-10-CM

## 2019-06-25 DIAGNOSIS — I255 Ischemic cardiomyopathy: Secondary | ICD-10-CM | POA: Diagnosis not present

## 2019-06-25 DIAGNOSIS — E785 Hyperlipidemia, unspecified: Secondary | ICD-10-CM

## 2019-06-25 DIAGNOSIS — Z131 Encounter for screening for diabetes mellitus: Secondary | ICD-10-CM | POA: Diagnosis not present

## 2019-06-25 MED ORDER — CARVEDILOL 25 MG PO TABS: 25.0000 mg | ORAL_TABLET | Freq: Two times a day (BID) | ORAL | 0 refills | Status: DC

## 2019-06-25 NOTE — Patient Instructions (Signed)
Medication Instructions:  Your provider recommends that you continue on your current medications as directed. Please refer to the Current Medication list given to you today.   *If you need a refill on your cardiac medications before your next appointment, please call your pharmacy*  Lab Work: TODAY: A1C If you have labs (blood work) drawn today and your tests are completely normal, you will receive your results only by: Marland Kitchen MyChart Message (if you have MyChart) OR . A paper copy in the mail If you have any lab test that is abnormal or we need to change your treatment, we will call you to review the results.  Follow-Up: You will be called to arrange your 1 year echo and office visit with Dr. Burt Knack.

## 2019-06-25 NOTE — Progress Notes (Signed)
Cardiology Office Note:    Date:  06/25/2019   ID:  DOROTEO SYDNEY, DOB [birth year], MRN [id number]  PCP:  Biagio Borg, MD  Cardiologist:  [name], MD  Electrophysiologist:  Constance Haw, MD   Referring MD: Biagio Borg, MD   No chief complaint on file.   History of Present Illness:    [name] is a 74 y.o. male with a hx of chronic systolic heart failure with left bundle branch block, coronary artery disease with remote stenting of the RCA, and paroxysmal atrial fibrillation.  He presents for follow-up evaluation today.  He underwent CRT-D implantation in 2018.  He was ultimately found to have atrial fibrillation on remote device monitoring and was started on anticoagulation with apixaban.  His antiplatelet therapy was stopped at that time.  LV function normalized after cardiac resynchronization.  The patient is here alone today.  He has developed progressive shortness of breath with activity.  He has not been doing any exercise and has gained weight over the past year.  He denies orthopnea, PND, chest pain, chest pressure, or heart palpitations.  He did have an episode of atrial fibrillation that lasted about 48 hours and he remembers feeling poorly during that episode.  He was evaluated in the atrial fibrillation clinic and was offered Tikosyn but declined.  His atrial fibrillation burden is about 2-1/2%.  He is tolerating oral anticoagulation.  He is otherwise doing well.  Past Medical History:  Diagnosis Date  . AICD (automatic cardioverter/defibrillator) present   . Allergic rhinitis   . Allergy   . Anginal pain ([place]) 05/26/14   chest pain after chasing dog  . Atypical nevus of back 04/27/2003   moderate - mid lower back  . Basal cell carcinoma 01/26/2008   right cheek - MOHs  . CAD (coronary artery disease)    hx of stent- 2005 RCA  . Cataract    beginning stage both eyes  . CHF (congestive heart failure) (HCC)    pacemaker Medtronic    . Chronic back  pain    intermittent  . Constipation   . Dizziness   . Dysrhythmia    right bundle branch block   . Esophageal stricture   . GERD (gastroesophageal reflux disease)   . Hemoptysis   . Hiatal hernia   . History of colonic polyps    hyperplastic  . HTN (hypertension)   . Hyperlipidemia   . Hypertrophy of prostate with urinary obstruction and other lower urinary tract symptoms (LUTS)   . OA (osteoarthritis)   . OSA (obstructive sleep apnea)    cpap- 10   . 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 Pneumonia   . Pre-diabetes    no medications  . Shortness of breath    with exertion  . Sleep apnea    cpap  . Testicular hypofunction     Past Surgical History:  Procedure Laterality Date  . BIV ICD INSERTION CRT-D N/A 03/27/2017   Procedure: BIV ICD INSERTION CRT-D;  Surgeon: Constance Haw, MD;  Location: [facility];  Service: Cardiovascular;  Laterality: N/A;  . CARDIAC CATHETERIZATION     '05, last 2009, showing patent RCA stent  . COLONOSCOPY  12/2007   HYPERPLASTIC POLYP  . COLONOSCOPY WITH PROPOFOL N/A 06/02/2014   Procedure: COLONOSCOPY WITH PROPOFOL;  Surgeon: [name], MD;  Location: [facility];  Service:  Endoscopy;  Laterality: N/A;  . CORONARY ANGIOPLASTY  08/2003  . ESOPHAGOGASTRODUODENOSCOPY (EGD) WITH PROPOFOL N/A 06/02/2014   Procedure: ESOPHAGOGASTRODUODENOSCOPY (EGD) WITH PROPOFOL;  Surgeon: [name], MD;  Location: [facility];  Service: Endoscopy;  Laterality: N/A;  . hernia surgery x 3     Bilateral Inguinal, Umbicial  . INSERTION OF MESH N/A 03/20/2018   Procedure: INSERTION OF MESH;  Surgeon: Alphonsa Overall, MD;  Location: [facility];  Service: General;  Laterality: N/A;  . IRRIGATION AND DEBRIDEMENT ABSCESS Left 08/14/2012   Procedure: IRRIGATION AND DEBRIDEMENT LEFT INGUINAL BOIL ;  Surgeon: Ailene Rud, MD;  Location: [facility];  Service: Urology;   Laterality: Left;  . JOINT REPLACEMENT Right 2017  . KNEE ARTHROPLASTY    . LEFT HEART CATH AND CORONARY ANGIOGRAPHY N/A 01/24/2019   Procedure: LEFT HEART CATH AND CORONARY ANGIOGRAPHY;  Surgeon: Martinique, Peter M, MD;  Location: [facility];  Service: Cardiovascular;  Laterality: N/A;  . LEFT HEART CATHETERIZATION WITH CORONARY ANGIOGRAM N/A 05/26/2014   Procedure: LEFT HEART CATHETERIZATION WITH CORONARY ANGIOGRAM;  Surgeon: Blane Ohara, MD;  Location: [facility];  Service: Cardiovascular;  Laterality: N/A;  . LUMBAR LAMINECTOMY/DECOMPRESSION MICRODISCECTOMY Left 09/26/2014   Procedure: Lumbar Laminectomy for resection of synovial cyst  Lumbar five- sacral one left;  Surgeon: Kary Kos, MD;  Location: [facility];  Service: Neurosurgery;  Laterality: Left;  . MOLE REMOVAL Left 03/20/2018   Procedure: MOLE REMOVAL;  Surgeon: Alphonsa Overall, MD;  Location: [facility];  Service: General;  Laterality: Left;  . Oral surg to removed growth from ?sinus  10/2010   ?Dermoid removed by DrRiggs  . PACEMAKER INSERTION  04/03/2017  . POLYPECTOMY    . RCA stenting     '05 RCA  . right toe surgery  Right    Cyst   . RIGHT/LEFT HEART CATH AND CORONARY ANGIOGRAPHY N/A 01/15/2017   Procedure: RIGHT/LEFT HEART CATH AND CORONARY ANGIOGRAPHY;  Surgeon: [name], MD;  Location: [place];  Service: Cardiovascular;  Laterality: N/A;  . s/p right knee arthroscopy  2005  . thumb surgery Right   . TOTAL KNEE ARTHROPLASTY Right 03/12/2016   Procedure: RIGHT TOTAL KNEE ARTHROPLASTY;  Surgeon: Paralee Cancel, MD;  Location: [facility];  Service: Orthopedics;  Laterality: Right;  . UPPER GASTROINTESTINAL ENDOSCOPY    . VENTRAL HERNIA REPAIR N/A 03/20/2018   Procedure: [place];  Surgeon: Alphonsa Overall, MD;  Location: [facility];  Service: General;  Laterality: N/A;    Current Medications: Current Meds  Medication Sig  . acetaminophen (TYLENOL) 500 MG tablet Take  500 mg by mouth 2 (two) times daily.  Marland Kitchen atorvastatin (LIPITOR) 20 MG tablet Take 1 tablet (20 mg total) by mouth daily.  Marland Kitchen b complex vitamins tablet Take 1 tablet by mouth daily with lunch.   . calcium carbonate (OSCAL) 1500 (600 Ca) MG TABS tablet Take 600 mg by mouth every evening.  . carvedilol (COREG) 25 MG tablet Take 1 tablet (25 mg total) by mouth 2 (two) times daily with a meal.  . CINNAMON PO Take 1 tablet by mouth daily with lunch.   . Coenzyme Q10 (CO Q 10) 100 MG CAPS Take 1 capsule by mouth daily with lunch.   . colchicine 0.6 MG tablet Take 1 tablet (0.6 mg total) by mouth daily.  . diclofenac sodium (VOLTAREN) 1 % GEL Apply 1 application topically 4 (four) times daily as needed (pain).   Marland Kitchen  ELIQUIS 5 MG TABS tablet TAKE 1 TABLET(5 MG) BY MOUTH TWICE DAILY  . ENTRESTO 49-51 MG TAKE 1 TABLET BY MOUTH 2 TIMES DAILY  . folic acid (FOLVITE) A999333 MCG tablet Take 400 mcg by mouth daily.   . Glucosamine HCl (GLUCOSAMINE PO) Take 1,500 mg by mouth every evening.  . hydrocortisone 2.5 % cream Apply 1 application topically 2 (two) times daily as needed (rash on nose).   . hydroxychloroquine (PLAQUENIL) 200 MG tablet TAKE 1 TABLET BY MOUTH TWICE DAILY MONDAY THROUGH FRIDAY ONLY. DO NOT TAKE SATURDAY OR SUNDAY  . hydroxypropyl methylcellulose / hypromellose (ISOPTO TEARS / GONIOVISC) 2.5 % ophthalmic solution Place 1 drop into both eyes 4 (four) times daily as needed for dry eyes.   . Multiple Vitamin (MULTIVITAMIN) capsule Take 1 capsule by mouth daily.    . Multiple Vitamins-Minerals (OCUVITE PO) Take 1 tablet by mouth at bedtime.  . nitroGLYCERIN (NITROSTAT) 0.4 MG SL tablet Place 1 tablet (0.4 mg total) under the tongue every 5 (five) minutes x 3 doses as needed for chest pain.  . NON FORMULARY CPAP machine with sleep.  . pantoprazole (PROTONIX) 40 MG tablet TAKE 1 TABLET BY MOUTH EVERY DAY BEFORE BREAKFAST GENERIC EQUIVALENT FOR PROTONIX  . Probiotic Product (PROBIOTIC DAILY PO) Take 1  capsule by mouth daily with lunch.   . Saw Palmetto, Serenoa repens, (SAW PALMETTO PO) Take 1 capsule by mouth every evening.  Marland Kitchen spironolactone (ALDACTONE) 25 MG tablet TAKE 1/2 TABLET(12.5 MG) BY MOUTH DAILY  . tamsulosin (FLOMAX) 0.4 MG CAPS capsule Take 0.4 mg by mouth at bedtime.  . Testosterone 20 % CREA Apply 2 mLs topically daily. Rub on shoulder  . traMADol (ULTRAM) 50 MG tablet TAKE 1 TABLET(50 MG) BY MOUTH AT BEDTIME AS NEEDED  . TURMERIC PO Take 1 tablet by mouth daily with lunch.   . [DISCONTINUED] carvedilol (COREG) 25 MG tablet Take 1 tablet (25 mg total) by mouth 2 (two) times daily.     Allergies:   Sulfonamide derivatives   Social History   Socioeconomic History  . Marital status: Married    Spouse name: Not on file  . Number of children: 1  . Years of education: Not on file  . Highest education level: Not on file  Occupational History  . Occupation: Lobbyist: [employer]  Tobacco Use  . Smoking status: Former Smoker    Quit date: 05/20/1990    Years since quitting: 29.1  . Smokeless tobacco: Never Used  . Tobacco comment: previous 30 pack year history  Substance and Sexual Activity  . Alcohol use: Yes    Comment: rare  . Drug use: Never  . Sexual activity: Not on file  Other Topics Concern  . Not on file  Social History Narrative  . Not on file   Social Determinants of Health   Financial Resource Strain:   . Difficulty of Paying Living Expenses: Not on file  Food Insecurity:   . Worried About Charity fundraiser in the Last Year: Not on file  . Ran Out of Food in the Last Year: Not on file  Transportation Needs:   . Lack of Transportation (Medical): Not on file  . Lack of Transportation (Non-Medical): Not on file  Physical Activity:   . Days of Exercise per Week: Not on file  . Minutes of Exercise per Session: Not on file  Stress:   . Feeling of Stress : Not on file  Social Connections:   . Frequency of Communication  with Friends and Family: Not on file  . Frequency of Social Gatherings with Friends and Family: Not on file  . Attends Religious Services: Not on file  . Active Member of Clubs or Organizations: Not on file  . Attends Archivist Meetings: Not on file  . Marital Status: Not on file     Family History: The patient's family history includes Arthritis in his brother and daughter; COPD in his brother; Cancer in his brother; Coronary artery disease in his father; Diabetes in his father; Heart disease in his father and mother; Heart failure in his brother; Hypertension in an other family member; Stomach cancer in his paternal grandmother; Sudden death in his father. There is no history of Colon cancer, Esophageal cancer, Colon polyps, or Ulcerative colitis.  ROS:   Please see the history of present illness.    All other systems reviewed and are negative.  EKGs/Labs/Other Studies Reviewed:    The following studies were reviewed today: Echo 01-24-2019: IMPRESSIONS    1. The left ventricle has low normal systolic function, with an ejection  fraction of 50-55%. The cavity size was normal. There is moderate  concentric left ventricular hypertrophy. Left ventricular diastolic  Doppler parameters are consistent with  pseudonormalization.  2. The right ventricle has normal systolic function. The cavity was  normal. There is no increase in right ventricular wall thickness.  3. Trivial pericardial effusion is present.  Cardiac Cath 01-24-2019: Conclusion    Mid LAD lesion is 30% stenosed.  Non-stenotic Mid RCA lesion was previously treated.  The left ventricular systolic function is normal.  LV end diastolic pressure is normal.  The left ventricular ejection fraction is 55-65% by visual estimate.   1. Nonobstructive CAD. Continued patency of the stent in the RCA 2. Good LV systolic function 3. Normal LVEDP  Plan: will obtain an Echo. Consider a trial of colchicine. Resume  Eliquis tomorrow am.    Coronary Findings  Diagnostic Dominance: Right Left Anterior Descending  The vessel exhibits minimal luminal irregularities.  Mid LAD lesion 30% stenosed  Mid LAD lesion is 30% stenosed.  Left Circumflex  The vessel exhibits minimal luminal irregularities.  Right Coronary Artery  The vessel exhibits minimal luminal irregularities.  Mid RCA lesion 0% stenosed  Non-stenotic Mid RCA lesion was previously treated.  Intervention  No interventions have been documented. Wall Motion  Resting               Left Heart  Left Ventricle The left ventricular size is normal. The left ventricular systolic function is normal. LV end diastolic pressure is normal. The left ventricular ejection fraction is 55-65% by visual estimate. No regional wall motion abnormalities.  Coronary Diagrams  Diagnostic Dominance: Right   EKG:  EKG is not ordered today.    Recent Labs: 01/24/2019: B Natriuretic Peptide 40.2; TSH 2.402 06/22/2019: ALT 21; BUN 15; Creat 0.93; Hemoglobin 14.8; Platelets 247; Potassium 3.9; Sodium 137  Recent Lipid Panel    Component Value Date/Time   CHOL 97 01/24/2019 0725   CHOL 138 11/27/2016 0914   TRIG 161 (H) 01/24/2019 0725   HDL 27 (L) 01/24/2019 0725   HDL 35 (L) 11/27/2016 0914   CHOLHDL 3.6 01/24/2019 0725   VLDL 32 01/24/2019 0725   LDLCALC 38 01/24/2019 0725   LDLCALC 65 11/27/2016 0914    Physical Exam:    VS:  BP 128/68   Pulse 72   Ht 5'  7" (1.702 m)   Wt 189 lb 3.2 oz (85.8 kg)   SpO2 95%   BMI 29.63 kg/m     Wt Readings from Last 3 Encounters:  06/25/19 189 lb 3.2 oz (85.8 kg)  05/18/19 194 lb 3.2 oz (88.1 kg)  02/09/19 187 lb 6.4 oz (85 kg)     GEN:  Well nourished, well developed in no acute distress HEENT: Normal NECK: No JVD; No carotid bruits LYMPHATICS: No lymphadenopathy CARDIAC: RRR, no murmurs, rubs, gallops RESPIRATORY:  Clear to auscultation without rales, wheezing or rhonchi  ABDOMEN: Soft,  non-tender, non-distended MUSCULOSKELETAL:  No edema; No deformity  SKIN: Warm and dry NEUROLOGIC:  Alert and oriented x 3 PSYCHIATRIC:  Normal affect   ASSESSMENT:    1. Chronic systolic (congestive) heart failure ([place])   2. Hyperlipidemia, unspecified hyperlipidemia type   3. Coronary artery disease involving native coronary artery of native heart without angina pectoris   4. Hyperglycemia   5. PAF (paroxysmal atrial fibrillation) (HCC)    PLAN:    In order of problems listed above:  1. The patient is stable on his current medical program.  His most recent echocardiogram is reviewed with LVEF 55%.  He had improvement in LV function with cardiac resynchronization.  He continues on Entresto, carvedilol, and spironolactone.  No changes are made today in his regimen.  On physical exam there is no evidence of volume overload.  I suspect his exertional dyspnea is multifactorial and primarily related to deconditioning and weight gain.  We discussed this at length today and he is going to establish an exercise regimen. 2. Treated with atorvastatin.  Recent lipids are reviewed and are at goal. 3. The patient has no anginal symptoms.  He is off of antiplatelet therapy because of chronic oral anticoagulation. 4. His recent labs demonstrated a blood glucose of 136.  We will check a hemoglobin A1c today. 5. We discussed treatment options at length today.  He is not inclined to start any more medications.  He especially does not like the idea of hospital admission for Tikosyn and has concerns about the cost as well.  He would be willing to consider ablation if appropriate.  He will review with Dr. Curt Bears when he is seen later this month.   Medication Adjustments/Labs and Tests Ordered: Current medicines are reviewed at length with the patient today.  Concerns regarding medicines are outlined above.  Orders Placed This Encounter  Procedures  . HgB A1c  . ECHOCARDIOGRAM COMPLETE   Meds ordered  this encounter  Medications  . carvedilol (COREG) 25 MG tablet    Sig: Take 1 tablet (25 mg total) by mouth 2 (two) times daily with a meal.    Dispense:  60 tablet    Refill:  0    Patient Instructions  Medication Instructions:  Your provider recommends that you continue on your current medications as directed. Please refer to the Current Medication list given to you today.   *If you need a refill on your cardiac medications before your next appointment, please call your pharmacy*  Lab Work: TODAY: A1C If you have labs (blood work) drawn today and your tests are completely normal, you will receive your results only by: Marland Kitchen MyChart Message (if you have MyChart) OR . A paper copy in the mail If you have any lab test that is abnormal or we need to change your treatment, we will call you to review the results.  Follow-Up: You will be called to arrange  your 1 year echo and office visit with Dr. Burt Knack.    Signed, [name], MD  06/25/2019 9:44 AM    [place]

## 2019-06-26 LAB — HEMOGLOBIN A1C
Est. average glucose Bld gHb Est-mCnc: 123 mg/dL
Hgb A1c MFr Bld: 5.9 % — ABNORMAL HIGH (ref 4.8–5.6)

## 2019-06-27 ENCOUNTER — Other Ambulatory Visit: Payer: Self-pay | Admitting: Cardiovascular Disease

## 2019-07-05 ENCOUNTER — Ambulatory Visit (INDEPENDENT_AMBULATORY_CARE_PROVIDER_SITE_OTHER): Payer: Medicare Other

## 2019-07-05 DIAGNOSIS — I5022 Chronic systolic (congestive) heart failure: Secondary | ICD-10-CM

## 2019-07-05 DIAGNOSIS — Z95810 Presence of automatic (implantable) cardiac defibrillator: Secondary | ICD-10-CM | POA: Diagnosis not present

## 2019-07-06 ENCOUNTER — Encounter: Payer: Self-pay | Admitting: Internal Medicine

## 2019-07-06 ENCOUNTER — Other Ambulatory Visit: Payer: Self-pay

## 2019-07-06 ENCOUNTER — Other Ambulatory Visit: Payer: Self-pay | Admitting: Internal Medicine

## 2019-07-06 ENCOUNTER — Ambulatory Visit (INDEPENDENT_AMBULATORY_CARE_PROVIDER_SITE_OTHER): Payer: Medicare Other | Admitting: Internal Medicine

## 2019-07-06 ENCOUNTER — Ambulatory Visit (INDEPENDENT_AMBULATORY_CARE_PROVIDER_SITE_OTHER): Payer: Medicare Other

## 2019-07-06 VITALS — BP 134/70 | HR 74 | Temp 98.0°F | Ht 67.0 in | Wt 192.2 lb

## 2019-07-06 DIAGNOSIS — M25551 Pain in right hip: Secondary | ICD-10-CM | POA: Diagnosis not present

## 2019-07-06 DIAGNOSIS — I319 Disease of pericardium, unspecified: Secondary | ICD-10-CM | POA: Diagnosis not present

## 2019-07-06 DIAGNOSIS — I10 Essential (primary) hypertension: Secondary | ICD-10-CM | POA: Diagnosis not present

## 2019-07-06 DIAGNOSIS — E785 Hyperlipidemia, unspecified: Secondary | ICD-10-CM | POA: Diagnosis not present

## 2019-07-06 DIAGNOSIS — I2510 Atherosclerotic heart disease of native coronary artery without angina pectoris: Secondary | ICD-10-CM

## 2019-07-06 DIAGNOSIS — R7303 Prediabetes: Secondary | ICD-10-CM

## 2019-07-06 HISTORY — DX: Disease of pericardium, unspecified: I31.9

## 2019-07-06 NOTE — Progress Notes (Signed)
Subjective:    Patient ID: [name], male    DOB: [birth year], 74 y.o.   MRN: [id number]  HPI  Here to f/u; overall doing ok,  Pt denies chest pain, increasing sob or doe, wheezing, orthopnea, PND, increased LE swelling, palpitations, dizziness or syncope.  Pt denies new neurological symptoms such as new headache, or facial or extremity weakness or numbness.  Pt denies polydipsia, polyuria, or low sugar episode.  Pt states overall good compliance with meds, mostly trying to follow appropriate diet, with wt overall stable,  but little exercise however.   Afib difficult to control, to see Dr Curt Bears next wk, ? Ablation.  Also with 3 mo recurrent right groin pain, only worse to walk or shift in the seat Past Medical History:  Diagnosis Date  . AICD (automatic cardioverter/defibrillator) present   . Allergic rhinitis   . Allergy   . Anginal pain ([place]) 05/26/14   chest pain after chasing dog  . Atypical nevus of back 04/27/2003   moderate - mid lower back  . Basal cell carcinoma 01/26/2008   right cheek - MOHs  . CAD (coronary artery disease)    hx of stent- 2005 RCA  . Cataract    beginning stage both eyes  . CHF (congestive heart failure) (HCC)    pacemaker [employer]    . Chronic back pain    intermittent  . Constipation   . Dizziness   . Dysrhythmia    right bundle branch block   . Esophageal stricture   . GERD (gastroesophageal reflux disease)   . Hemoptysis   . Hiatal hernia   . History of colonic polyps    hyperplastic  . HTN (hypertension)   . Hyperlipidemia   . Hypertrophy of prostate with urinary obstruction and other lower urinary tract symptoms (LUTS)   . OA (osteoarthritis)   . OSA (obstructive sleep apnea)    cpap- 10   . 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 Pericarditis 07/06/2019  . Pneumonia   . Pre-diabetes    no medications  . Shortness of breath    with exertion    . Sleep apnea    cpap  . Testicular hypofunction    Past Surgical History:  Procedure Laterality Date  . BIV ICD INSERTION CRT-D N/A 03/27/2017   Procedure: BIV ICD INSERTION CRT-D;  Surgeon: Constance Haw, MD;  Location: [place] [facility];  Service: Cardiovascular;  Laterality: N/A;  . CARDIAC CATHETERIZATION     '05, last 2009, showing patent RCA stent  . COLONOSCOPY  12/2007   HYPERPLASTIC POLYP  . COLONOSCOPY WITH PROPOFOL N/A 06/02/2014   Procedure: COLONOSCOPY WITH PROPOFOL;  Surgeon: [name], MD;  Location: [facility];  Service: Endoscopy;  Laterality: N/A;  . CORONARY ANGIOPLASTY  08/2003  . ESOPHAGOGASTRODUODENOSCOPY (EGD) WITH PROPOFOL N/A 06/02/2014   Procedure: ESOPHAGOGASTRODUODENOSCOPY (EGD) WITH PROPOFOL;  Surgeon: [name], MD;  Location: [facility];  Service: Endoscopy;  Laterality: N/A;  . hernia surgery x 3     Bilateral Inguinal, Umbicial  . INSERTION OF MESH N/A 03/20/2018   Procedure: INSERTION OF MESH;  Surgeon: Alphonsa Overall, MD;  Location: [place];  Service: General;  Laterality: N/A;  . IRRIGATION AND DEBRIDEMENT ABSCESS Left 08/14/2012   Procedure: IRRIGATION AND DEBRIDEMENT LEFT INGUINAL BOIL ;  Surgeon: Ailene Rud, MD;  Location: [facility];  Service: Urology;  Laterality: Left;  .  JOINT REPLACEMENT Right 2017  . KNEE ARTHROPLASTY    . LEFT HEART CATH AND CORONARY ANGIOGRAPHY N/A 01/24/2019   Procedure: LEFT HEART CATH AND CORONARY ANGIOGRAPHY;  Surgeon: Martinique, Peter M, MD;  Location: [place] [facility];  Service: Cardiovascular;  Laterality: N/A;  . LEFT HEART CATHETERIZATION WITH CORONARY ANGIOGRAM N/A 05/26/2014   Procedure: LEFT HEART CATHETERIZATION WITH CORONARY ANGIOGRAM;  Surgeon: Blane Ohara, MD;  Location: [facility];  Service: Cardiovascular;  Laterality: N/A;  . LUMBAR LAMINECTOMY/DECOMPRESSION MICRODISCECTOMY Left 09/26/2014   Procedure: Lumbar Laminectomy for resection of synovial cyst  Lumbar five- sacral one left;   Surgeon: Kary Kos, MD;  Location: [place] [facility];  Service: Neurosurgery;  Laterality: Left;  . MOLE REMOVAL Left 03/20/2018   Procedure: MOLE REMOVAL;  Surgeon: Alphonsa Overall, MD;  Location: [place];  Service: General;  Laterality: Left;  . Oral surg to removed growth from ?sinus  10/2010   ?Dermoid removed by DrRiggs  . PACEMAKER INSERTION  04/03/2017  . POLYPECTOMY    . RCA stenting     '05 RCA  . right toe surgery  Right    Cyst   . RIGHT/LEFT HEART CATH AND CORONARY ANGIOGRAPHY N/A 01/15/2017   Procedure: RIGHT/LEFT HEART CATH AND CORONARY ANGIOGRAPHY;  Surgeon: [name], MD;  Location: [place] [facility];  Service: Cardiovascular;  Laterality: N/A;  . s/p right knee arthroscopy  2005  . thumb surgery Right   . TOTAL KNEE ARTHROPLASTY Right 03/12/2016   Procedure: RIGHT TOTAL KNEE ARTHROPLASTY;  Surgeon: Paralee Cancel, MD;  Location: [facility];  Service: Orthopedics;  Laterality: Right;  . UPPER GASTROINTESTINAL ENDOSCOPY    . VENTRAL HERNIA REPAIR N/A 03/20/2018   Procedure: LAPAROSCOPIC VENTRAL INCISIONAL  HERNIA ERAS PATHWAY;  Surgeon: Alphonsa Overall, MD;  Location: [place];  Service: General;  Laterality: N/A;    reports that he quit smoking about 29 years ago. He has never used smokeless tobacco. He reports current alcohol use. He reports that he does not use drugs. family history includes Arthritis in his brother and daughter; COPD in his brother; Cancer in his brother; Coronary artery disease in his father; Diabetes in his father; Heart disease in his father and mother; Heart failure in his brother; Hypertension in an other family member; Stomach cancer in his paternal grandmother; Sudden death in his father. Allergies  Allergen Reactions  . Sulfonamide Derivatives Rash        Current Outpatient Medications on File Prior to Visit  Medication Sig Dispense Refill  . acetaminophen (TYLENOL) 500 MG tablet Take 500 mg by mouth 2 (two) times daily.    Marland Kitchen atorvastatin (LIPITOR) 20 MG  tablet Take 1 tablet (20 mg total) by mouth daily. 90 tablet 3  . b complex vitamins tablet Take 1 tablet by mouth daily with lunch.     . calcium carbonate (OSCAL) 1500 (600 Ca) MG TABS tablet Take 600 mg by mouth every evening.    . carvedilol (COREG) 25 MG tablet TAKE 1 TABLET(25 MG) BY MOUTH TWICE DAILY WITH A MEAL 180 tablet 3  . CINNAMON PO Take 1 tablet by mouth daily with lunch.     . Coenzyme Q10 (CO Q 10) 100 MG CAPS Take 1 capsule by mouth daily with lunch.     . colchicine 0.6 MG tablet Take 1 tablet (0.6 mg total) by mouth daily. 30 tablet 0  . diclofenac sodium (VOLTAREN) 1 % GEL Apply 1 application topically 4 (four) times daily as  needed (pain).   3  . ELIQUIS 5 MG TABS tablet TAKE 1 TABLET(5 MG) BY MOUTH TWICE DAILY 60 tablet 2  . ENTRESTO 49-51 MG TAKE 1 TABLET BY MOUTH 2 TIMES DAILY 99991111 tablet 2  . folic acid (FOLVITE) A999333 MCG tablet Take 400 mcg by mouth daily.     . Glucosamine HCl (GLUCOSAMINE PO) Take 1,500 mg by mouth every evening.    . hydrocortisone 2.5 % cream Apply 1 application topically 2 (two) times daily as needed (rash on nose).     . hydroxychloroquine (PLAQUENIL) 200 MG tablet TAKE 1 TABLET BY MOUTH TWICE DAILY MONDAY THROUGH FRIDAY ONLY. DO NOT TAKE SATURDAY OR SUNDAY 120 tablet 0  . hydroxypropyl methylcellulose / hypromellose (ISOPTO TEARS / GONIOVISC) 2.5 % ophthalmic solution Place 1 drop into both eyes 4 (four) times daily as needed for dry eyes.     . Multiple Vitamin (MULTIVITAMIN) capsule Take 1 capsule by mouth daily.      . Multiple Vitamins-Minerals (OCUVITE PO) Take 1 tablet by mouth at bedtime.    . nitroGLYCERIN (NITROSTAT) 0.4 MG SL tablet Place 1 tablet (0.4 mg total) under the tongue every 5 (five) minutes x 3 doses as needed for chest pain. 25 tablet 0  . NON FORMULARY CPAP machine with sleep.    . pantoprazole (PROTONIX) 40 MG tablet TAKE 1 TABLET BY MOUTH EVERY DAY BEFORE BREAKFAST GENERIC EQUIVALENT FOR PROTONIX 90 tablet 3  . Probiotic  Product (PROBIOTIC DAILY PO) Take 1 capsule by mouth daily with lunch.     . Saw Palmetto, Serenoa repens, (SAW PALMETTO PO) Take 1 capsule by mouth every evening.    Marland Kitchen spironolactone (ALDACTONE) 25 MG tablet TAKE 1/2 TABLET(12.5 MG) BY MOUTH DAILY 45 tablet 3  . tamsulosin (FLOMAX) 0.4 MG CAPS capsule Take 0.4 mg by mouth at bedtime.    . Testosterone 20 % CREA Apply 2 mLs topically daily. Rub on shoulder    . traMADol (ULTRAM) 50 MG tablet TAKE 1 TABLET(50 MG) BY MOUTH AT BEDTIME AS NEEDED 30 tablet 0  . TURMERIC PO Take 1 tablet by mouth daily with lunch.      No current facility-administered medications on file prior to visit.   Review of Systems  Constitutional: Negative for other unusual diaphoresis or sweats HENT: Negative for ear discharge or swelling Eyes: Negative for other worsening visual disturbances Respiratory: Negative for stridor or other swelling  Gastrointestinal: Negative for worsening distension or other blood Genitourinary: Negative for retention or other urinary change Musculoskeletal: Negative for other MSK pain or swelling Skin: Negative for color change or other new lesions Neurological: Negative for worsening tremors and other numbness  Psychiatric/Behavioral: Negative for worsening agitation or other fatigue All otherwise neg per pt     Objective:   Physical Exam BP 134/70   Pulse 74   Temp 98 F (36.7 C)   Ht 5\' 7"  (1.702 m)   Wt 192 lb 3.2 oz (87.2 kg)   SpO2 98%   BMI 30.10 kg/m  VS noted,  Constitutional: Pt appears in NAD HENT: Head: NCAT.  Right Ear: External ear normal.  Left Ear: External ear normal.  Eyes: . Pupils are equal, round, and reactive to light. Conjunctivae and EOM are normal Nose: without d/c or deformity Neck: Neck supple. Gross normal ROM Cardiovascular: Normal rate and regular rhythm.   Pulmonary/Chest: Effort normal and breath sounds without rales or wheezing.  Abd:  Soft, NT, ND, + BS, no organomegaly Neurological: Pt  is alert. At baseline orientation, motor grossly intact Skin: Skin is warm. No rashes, other new lesions, no LE edema Psychiatric: Pt behavior is normal without agitation  All otherwise neg per pt Lab Results  Component Value Date   WBC 4.8 06/22/2019   HGB 14.8 06/22/2019   HCT 44.0 06/22/2019   PLT 247 06/22/2019   GLUCOSE 136 (H) 06/22/2019   CHOL 97 01/24/2019   TRIG 161 (H) 01/24/2019   HDL 27 (L) 01/24/2019   LDLCALC 38 01/24/2019   ALT 21 06/22/2019   AST 22 06/22/2019   NA 137 06/22/2019   K 3.9 06/22/2019   CL 103 06/22/2019   CREATININE 0.93 06/22/2019   BUN 15 06/22/2019   CO2 25 06/22/2019   TSH 2.402 01/24/2019   PSA 3.29 12/31/2017   INR 1.3 (H) 01/24/2019   HGBA1C 5.9 (H) 06/25/2019       Assessment & Plan:

## 2019-07-06 NOTE — Assessment & Plan Note (Addendum)
stable overall by history and exam, recent data reviewed with pt, and pt to continue medical treatment as before,  to f/u any worsening symptoms or concerns  I spent 31 minutes in addition to time for wellness examination in preparing to see the patient by review of recent labs, imaging and procedures, obtaining and reviewing separately obtained history, communicating with the patient and family or caregiver, ordering medications, tests or procedures, and documenting clinical information in the EHR including the differential Dx, treatment, and any further evaluation and other management of pre DM, HLD, HTN, and right groin pain

## 2019-07-06 NOTE — Assessment & Plan Note (Signed)
stable overall by history and exam, recent data reviewed with pt, and pt to continue medical treatment as before,  to f/u any worsening symptoms or concerns  

## 2019-07-06 NOTE — Patient Instructions (Signed)
Please continue all other medications as before, and refills have been done if requested.  Please have the pharmacy call with any other refills you may need.  Please continue your efforts at being more active, low cholesterol diet, and weight control.  You are otherwise up to date with prevention measures today.  Please keep your appointments with your specialists as you may have planned  Please go to the XRAY Department in the first floor for the x-ray testing  You will be contacted regarding the referral for: Sports Medicine for the right hip  Please make an Appointment to return in 6 months, or sooner if needed

## 2019-07-06 NOTE — Assessment & Plan Note (Signed)
C/w likely hip djd - for film and refer sport medicine

## 2019-07-09 NOTE — Progress Notes (Signed)
EPIC Encounter for ICM Monitoring  Patient Name: [name] is a 73 y.o. male Date: 07/09/2019 Primary Care Physican: Biagio Borg, MD Primary Cardiologist: Burt Knack Electrophysiologist: Curt Bears Bi-V Pacing:   94.8%       05/31/2019 Weight: 194 lbs                                                            Heart Failure questions reviewed.  Pt asymptomatic and doing well.    Optivol thoracic impedance normal.   Takes Spirolactone 25 mg take 0.5 tablet daily.  Recommendations: No changes and encouraged to call if experiencing any fluid symptoms.  Follow-up plan: ICM clinic phone appointment on 08/16/2019.   91 day device clinic remote transmission 07/29/2019.  Office appt 07/13/2019 with Dr Curt Bears.    Copy of ICM check sent to Dr. Curt Bears.   3 month ICM trend: 07/05/2019    1 Year ICM trend:       [name], RN 07/09/2019 2:35 PM

## 2019-07-13 ENCOUNTER — Encounter: Payer: Self-pay | Admitting: Cardiology

## 2019-07-13 ENCOUNTER — Ambulatory Visit (INDEPENDENT_AMBULATORY_CARE_PROVIDER_SITE_OTHER): Payer: Medicare Other | Admitting: Cardiology

## 2019-07-13 ENCOUNTER — Other Ambulatory Visit: Payer: Self-pay

## 2019-07-13 VITALS — BP 134/78 | HR 75 | Ht 67.0 in | Wt 189.8 lb

## 2019-07-13 DIAGNOSIS — I2510 Atherosclerotic heart disease of native coronary artery without angina pectoris: Secondary | ICD-10-CM | POA: Diagnosis not present

## 2019-07-13 DIAGNOSIS — I420 Dilated cardiomyopathy: Secondary | ICD-10-CM | POA: Diagnosis not present

## 2019-07-13 DIAGNOSIS — I5022 Chronic systolic (congestive) heart failure: Secondary | ICD-10-CM

## 2019-07-13 DIAGNOSIS — I480 Paroxysmal atrial fibrillation: Secondary | ICD-10-CM | POA: Diagnosis not present

## 2019-07-13 NOTE — Progress Notes (Signed)
Electrophysiology Office Note   Date:  07/13/2019   ID:  [name] January 03, 1946, MRN [id number]  PCP:  Billy Borg, MD  Cardiologist:  [name] Primary Electrophysiologist:  Dr [name]    CC: Follow up for device check/new onset afib.   History of Present Illness: [name] is a 74 y.o. male who is being seen today for the evaluation of CHF at the request of Billy Borg, MD. Presenting today for electrophysiology evaluation. He has a history of chronic systolic heart failure, coronary artery disease status post stenting of the RCA, left bundle branch block. He also has sleep apnea and uses a CPAP at night.  Medtronic CRT-D implanted 03/27/17.  Implant was comp gated by microperforation.  He was put on colchicine and that greatly improved his symptoms.  An episode of paroxysmal atrial fibrillation noted on remote transmission.  This episode lasted up to 14 hours and he was placed on Eliquis.  Today, denies symptoms of palpitations, chest pain, shortness of breath, orthopnea, PND, lower extremity edema, claudication, dizziness, presyncope, syncope, bleeding, or neurologic sequela. The patient is tolerating medications without difficulties.  He had an episode of atrial fibrillation in January that lasted up to 3 days.  He converted on his own has not had any further episodes.  He had some weakness and fatigue as well as some mild shortness of breath associated with the episode.  He presented A. fib clinic and was offered medical management for rhythm control but he declined.  He states that if he does have any rhythm control he would prefer ablation and avoidance of medications.  Past Medical History:  Diagnosis Date  . AICD (automatic cardioverter/defibrillator) present   . Allergic rhinitis   . Allergy   . Anginal pain ([place]) 05/26/14   chest pain after chasing dog  . Atypical nevus of back 04/27/2003   moderate - mid lower back  . Basal cell carcinoma 01/26/2008   right  cheek - MOHs  . CAD (coronary artery disease)    hx of stent- 2005 RCA  . Cataract    beginning stage both eyes  . CHF (congestive heart failure) (HCC)    pacemaker Medtronic    . Chronic back pain    intermittent  . Constipation   . Dizziness   . Dysrhythmia    right bundle branch block   . Esophageal stricture   . GERD (gastroesophageal reflux disease)   . Hemoptysis   . Hiatal hernia   . History of colonic polyps    hyperplastic  . HTN (hypertension)   . Hyperlipidemia   . Hypertrophy of prostate with urinary obstruction and other lower urinary tract symptoms (LUTS)   . OA (osteoarthritis)   . OSA (obstructive sleep apnea)    cpap- 10   . 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 Pericarditis 07/06/2019  . Pneumonia   . Pre-diabetes    no medications  . Shortness of breath    with exertion  . Sleep apnea    cpap  . Testicular hypofunction    Past Surgical History:  Procedure Laterality Date  . BIV ICD INSERTION CRT-D N/A 03/27/2017   Procedure: BIV ICD INSERTION CRT-D;  Surgeon: Constance Haw, MD;  Location: [place] [facility];  Service: Cardiovascular;  Laterality: N/A;  . CARDIAC CATHETERIZATION     '05, last 2009, showing patent RCA stent  .  COLONOSCOPY  12/2007   HYPERPLASTIC POLYP  . COLONOSCOPY WITH PROPOFOL N/A 06/02/2014   Procedure: COLONOSCOPY WITH PROPOFOL;  Surgeon: [name], MD;  Location: [facility];  Service: Endoscopy;  Laterality: N/A;  . CORONARY ANGIOPLASTY  08/2003  . ESOPHAGOGASTRODUODENOSCOPY (EGD) WITH PROPOFOL N/A 06/02/2014   Procedure: ESOPHAGOGASTRODUODENOSCOPY (EGD) WITH PROPOFOL;  Surgeon: [name], MD;  Location: [facility];  Service: Endoscopy;  Laterality: N/A;  . hernia surgery x 3     Bilateral Inguinal, Umbicial  . INSERTION OF MESH N/A 03/20/2018   Procedure: INSERTION OF MESH;  Surgeon: Alphonsa Overall, MD;  Location: [facility];   Service: General;  Laterality: N/A;  . IRRIGATION AND DEBRIDEMENT ABSCESS Left 08/14/2012   Procedure: IRRIGATION AND DEBRIDEMENT LEFT INGUINAL BOIL ;  Surgeon: Ailene Rud, MD;  Location: [facility];  Service: Urology;  Laterality: Left;  . JOINT REPLACEMENT Right 2017  . KNEE ARTHROPLASTY    . LEFT HEART CATH AND CORONARY ANGIOGRAPHY N/A 01/24/2019   Procedure: LEFT HEART CATH AND CORONARY ANGIOGRAPHY;  Surgeon: Martinique, Peter M, MD;  Location: [facility];  Service: Cardiovascular;  Laterality: N/A;  . LEFT HEART CATHETERIZATION WITH CORONARY ANGIOGRAM N/A 05/26/2014   Procedure: LEFT HEART CATHETERIZATION WITH CORONARY ANGIOGRAM;  Surgeon: Blane Ohara, MD;  Location: [facility];  Service: Cardiovascular;  Laterality: N/A;  . LUMBAR LAMINECTOMY/DECOMPRESSION MICRODISCECTOMY Left 09/26/2014   Procedure: Lumbar Laminectomy for resection of synovial cyst  Lumbar five- sacral one left;  Surgeon: Kary Kos, MD;  Location: [facility];  Service: Neurosurgery;  Laterality: Left;  . MOLE REMOVAL Left 03/20/2018   Procedure: MOLE REMOVAL;  Surgeon: Alphonsa Overall, MD;  Location: [facility];  Service: General;  Laterality: Left;  . Oral surg to removed growth from ?sinus  10/2010   ?Dermoid removed by DrRiggs  . PACEMAKER INSERTION  04/03/2017  . POLYPECTOMY    . RCA stenting     '05 RCA  . right toe surgery  Right    Cyst   . RIGHT/LEFT HEART CATH AND CORONARY ANGIOGRAPHY N/A 01/15/2017   Procedure: RIGHT/LEFT HEART CATH AND CORONARY ANGIOGRAPHY;  Surgeon: [name], MD;  Location: [facility];  Service: Cardiovascular;  Laterality: N/A;  . s/p right knee arthroscopy  2005  . thumb surgery Right   . TOTAL KNEE ARTHROPLASTY Right 03/12/2016   Procedure: RIGHT TOTAL KNEE ARTHROPLASTY;  Surgeon: Paralee Cancel, MD;  [facility]: [facility];  Service: Orthopedics;  Laterality: Right;  . UPPER GASTROINTESTINAL ENDOSCOPY    . VENTRAL HERNIA REPAIR N/A 03/20/2018   Procedure: [place];  Surgeon: Alphonsa Overall, MD;  Location: [facility];  Service: General;  Laterality: N/A;     Current Outpatient Medications  Medication Sig Dispense Refill  . acetaminophen (TYLENOL) 500 MG tablet Take 500 mg by mouth 2 (two) times daily.    Marland Kitchen atorvastatin (LIPITOR) 20 MG tablet Take 1 tablet (20 mg total) by mouth daily. 90 tablet 3  . b complex vitamins tablet Take 1 tablet by mouth daily with lunch.     . calcium carbonate (OSCAL) 1500 (600 Ca) MG TABS tablet Take 600 mg by mouth every evening.    . carvedilol (COREG) 25 MG tablet TAKE 1 TABLET(25 MG) BY MOUTH TWICE DAILY WITH A MEAL 180 tablet 3  . CINNAMON PO Take 1 tablet by mouth daily with lunch.     . Coenzyme Q10 (CO Q 10) 100 MG CAPS  Take 1 capsule by mouth daily with lunch.     . colchicine 0.6 MG tablet Take 1 tablet (0.6 mg total) by mouth daily. 30 tablet 0  . diclofenac sodium (VOLTAREN) 1 % GEL Apply 1 application topically 4 (four) times daily as needed (pain).   3  . ELIQUIS 5 MG TABS tablet TAKE 1 TABLET(5 MG) BY MOUTH TWICE DAILY 60 tablet 2  . ENTRESTO 49-51 MG TAKE 1 TABLET BY MOUTH 2 TIMES DAILY 99991111 tablet 2  . folic acid (FOLVITE) A999333 MCG tablet Take 400 mcg by mouth daily.     . Glucosamine HCl (GLUCOSAMINE PO) Take 1,500 mg by mouth every evening.    . hydrocortisone 2.5 % cream Apply 1 application topically 2 (two) times daily as needed (rash on nose).     . hydroxychloroquine (PLAQUENIL) 200 MG tablet TAKE 1 TABLET BY MOUTH TWICE DAILY MONDAY THROUGH FRIDAY ONLY. DO NOT TAKE SATURDAY OR SUNDAY 120 tablet 0  . hydroxypropyl methylcellulose / hypromellose (ISOPTO TEARS / GONIOVISC) 2.5 % ophthalmic solution Place 1 drop into both eyes 4 (four) times daily as needed for dry eyes.     . Multiple Vitamin (MULTIVITAMIN) capsule Take 1 capsule by mouth daily.      . Multiple Vitamins-Minerals (OCUVITE PO) Take 1 tablet by mouth at bedtime.    . nitroGLYCERIN (NITROSTAT) 0.4 MG SL  tablet Place 1 tablet (0.4 mg total) under the tongue every 5 (five) minutes x 3 doses as needed for chest pain. 25 tablet 0  . NON FORMULARY CPAP machine with sleep.    . pantoprazole (PROTONIX) 40 MG tablet TAKE 1 TABLET BY MOUTH EVERY DAY BEFORE BREAKFAST GENERIC EQUIVALENT FOR PROTONIX 90 tablet 3  . Probiotic Product (PROBIOTIC DAILY PO) Take 1 capsule by mouth daily with lunch.     . Saw Palmetto, Serenoa repens, (SAW PALMETTO PO) Take 1 capsule by mouth every evening.    Marland Kitchen spironolactone (ALDACTONE) 25 MG tablet TAKE 1/2 TABLET(12.5 MG) BY MOUTH DAILY 45 tablet 3  . tamsulosin (FLOMAX) 0.4 MG CAPS capsule Take 0.4 mg by mouth at bedtime.    . Testosterone 20 % CREA Apply 2 mLs topically daily. Rub on shoulder    . traMADol (ULTRAM) 50 MG tablet TAKE 1 TABLET(50 MG) BY MOUTH AT BEDTIME AS NEEDED 30 tablet 0  . TURMERIC PO Take 1 tablet by mouth daily with lunch.      No current facility-administered medications for this visit.    Allergies:   Sulfonamide derivatives   Social History:  The patient  reports that he quit smoking about 29 years ago. He has never used smokeless tobacco. He reports current alcohol use. He reports that he does not use drugs.   Family History:  The patient's family history includes Arthritis in his brother and daughter; COPD in his brother; Cancer in his brother; Coronary artery disease in his father; Diabetes in his father; Heart disease in his father and mother; Heart failure in his brother; Hypertension in an other family member; Stomach cancer in his paternal grandmother; Sudden death in his father.   ROS:  Please see the history of present illness.   Otherwise, review of systems is positive for none.   All other systems are reviewed and negative.   PHYSICAL EXAM: VS:  BP 134/78   Pulse 75   Ht 5\' 7"  (1.702 m)   Wt 189 lb 12.8 oz (86.1 kg)   SpO2 95%   BMI 29.73 kg/m  ,  BMI Body mass index is 29.73 kg/m. GEN: Well nourished, well developed, in no  acute distress  HEENT: normal  Neck: no JVD, carotid bruits, or masses Cardiac: RRR; no murmurs, rubs, or gallops,no edema  Respiratory:  clear to auscultation bilaterally, normal work of breathing GI: soft, nontender, nondistended, + BS MS: no deformity or atrophy  Skin: warm and dry, device site well healed Neuro:  Strength and sensation are intact Psych: euthymic mood, full affect  EKG:  EKG is not ordered today. Personal review of the ekg ordered 05/18/19 shows atrial sensed, ventricular paced  Personal review of the device interrogation today. Results in [place]: 01/24/2019: B Natriuretic Peptide 40.2; TSH 2.402 06/22/2019: ALT 21; BUN 15; Creat 0.93; Hemoglobin 14.8; Platelets 247; Potassium 3.9; Sodium 137    Lipid Panel     Component Value Date/Time   CHOL 97 01/24/2019 0725   CHOL 138 11/27/2016 0914   TRIG 161 (H) 01/24/2019 0725   HDL 27 (L) 01/24/2019 0725   HDL 35 (L) 11/27/2016 0914   CHOLHDL 3.6 01/24/2019 0725   VLDL 32 01/24/2019 0725   LDLCALC 38 01/24/2019 0725   LDLCALC 65 11/27/2016 0914     Wt Readings from Last 3 Encounters:  07/13/19 189 lb 12.8 oz (86.1 kg)  07/06/19 192 lb 3.2 oz (87.2 kg)  06/25/19 189 lb 3.2 oz (85.8 kg)      Other studies Reviewed: Additional studies/ records that were reviewed today include: TTE 06/25/16 Review of the above records today demonstrates:  - Left ventricle: The cavity size was normal. Systolic function was   normal. The estimated ejection fraction was in the range of 55%   to 60%. Wall motion was normal; there were no regional wall   motion abnormalities. Doppler parameters are consistent with   abnormal left ventricular relaxation (grade 1 diastolic   dysfunction). - Ventricular septum: Septal motion showed dyssynergy. These   changes are consistent with a left bundle branch block. - Aortic valve: There was trivial regurgitation. - Atrial septum: There was increased thickness of the septum,    consistent with lipomatous hypertrophy.  LHC/RHC 01/05/17 1. Widely patent coronary arteries with continued patency of the stented segment in the right coronary artery 2. Mild nonobstructive coronary artery disease as detailed with mild stenosis of the proximal to mid LAD and otherwise minimal luminal irregularities 3. Well compensated right-sided cardiac hemodynamics  ASSESSMENT AND PLAN:  1.  Chronic systolic heart failure: Status post Medtronic CRT-D implanted 03/27/2017.  Has had improvement in his LV systolic function back to normal.  Device functioning appropriately.  No changes.    2. Paroxysmal atrial fibrillation: Currently on Eliquis.  CHA2DS2-VASc of 4.  This is based on device remote transmissions.  He had 3 days of atrial fibrillation in January.  He converted on his own.  If he has more frequent episodes, he would prefer ablation over medical management.  3. Hyperlipidemia: Continue statin  4. Hypertension: Currently well controlled  5. Coronary artery disease, native vessel, without angina: No current chest pain.   Current medicines are reviewed at length with the patient today.   The patient does not have concerns regarding his medicines.  The following changes were made today: none  Labs/ tests ordered today include:  No orders of the defined types were placed in this encounter.    Disposition:   FU with Joselynne Killam 6 months  Signed, Landyn Buckalew [name], MD  07/13/2019 4:20 PM  [address] [address] [place] [address] [phone] (office) [phone] (fax)

## 2019-07-13 NOTE — Patient Instructions (Signed)
Medication Instructions:  Your physician recommends that you continue on your current medications as directed. Please refer to the Current Medication list given to you today.  *If you need a refill on your cardiac medications before your next appointment, please call your pharmacy*  Lab Work: None ordered  Testing/Procedures: None ordered   Follow-Up: Remote monitoring is used to monitor your Pacemaker of ICD from home. This monitoring reduces the number of office visits required to check your device to one time per year. It allows [place] to keep an eye on the functioning of your device to ensure it is working properly. You are scheduled for a device check from home on 07/29/2019. You may send your transmission at any time that day. If you have a wireless device, the transmission will be sent automatically. After your physician reviews your transmission, you will receive a postcard with your next transmission date.   ICM remote monitoring scheduled for 08/16/19   At [facility], you and your health needs are our priority.  As part of our continuing mission to provide you with exceptional heart care, we have created designated Provider Care Teams.  These Care Teams include your primary Cardiologist (physician) and Advanced Practice Providers (APPs -  Physician Assistants and Nurse Practitioners) who all work together to provide you with the care you need, when you need it.  Your next appointment:   6 month(s)  The format for your next appointment:   In Person  Provider:   Allegra Lai, MD  Thank you for choosing [facility]!!   Trinidad Curet, RN [phone]

## 2019-07-20 DIAGNOSIS — L812 Freckles: Secondary | ICD-10-CM | POA: Diagnosis not present

## 2019-07-20 DIAGNOSIS — L820 Inflamed seborrheic keratosis: Secondary | ICD-10-CM | POA: Diagnosis not present

## 2019-07-20 DIAGNOSIS — L570 Actinic keratosis: Secondary | ICD-10-CM | POA: Diagnosis not present

## 2019-07-20 DIAGNOSIS — L821 Other seborrheic keratosis: Secondary | ICD-10-CM | POA: Diagnosis not present

## 2019-07-21 ENCOUNTER — Other Ambulatory Visit: Payer: Self-pay

## 2019-07-21 ENCOUNTER — Telehealth: Payer: Self-pay | Admitting: Rheumatology

## 2019-07-21 ENCOUNTER — Ambulatory Visit (INDEPENDENT_AMBULATORY_CARE_PROVIDER_SITE_OTHER): Payer: Medicare Other | Admitting: Family Medicine

## 2019-07-21 ENCOUNTER — Encounter: Payer: Self-pay | Admitting: Family Medicine

## 2019-07-21 ENCOUNTER — Ambulatory Visit: Payer: Self-pay

## 2019-07-21 VITALS — BP 120/70 | HR 84 | Ht 67.0 in | Wt 190.2 lb

## 2019-07-21 DIAGNOSIS — I2510 Atherosclerotic heart disease of native coronary artery without angina pectoris: Secondary | ICD-10-CM

## 2019-07-21 DIAGNOSIS — G8929 Other chronic pain: Secondary | ICD-10-CM

## 2019-07-21 DIAGNOSIS — M25551 Pain in right hip: Secondary | ICD-10-CM | POA: Diagnosis not present

## 2019-07-21 MED ORDER — TRAMADOL HCL 50 MG PO TABS: ORAL_TABLET | ORAL | 0 refills | Status: DC

## 2019-07-21 NOTE — Patient Instructions (Signed)
Thank you for coming in today. I do think your pain is coming from your hip joint.  Next step would be injection of the hip to confirm the diagnosis and potentially treat it.  Keep me updated.  I am happy to do it at any time.  Stay active.  If you can water aerobics will help.  [facility] can also help.

## 2019-07-21 NOTE — Telephone Encounter (Signed)
Last Visit:12/22/2018 Next Visit:09/01/19 UDS: 06/22/19 Narc Agreement: 06/22/19  Last Fill 06/21/19  Okay to refill Tramadol?

## 2019-07-21 NOTE — Telephone Encounter (Signed)
Patient called requesting prescription refill of Tramadol to be sent to [facility] at [address].  Patient states he is out of medication.

## 2019-07-21 NOTE — Progress Notes (Signed)
Subjective:    I'm seeing this patient as a consultation for:  Dr. Jenny Reichmann. Note will be routed back to referring provider/PCP.  CC: R hip pain  I, Molly Weber, LAT, ATC, am serving as scribe for Dr. Lynne Leader.  HPI: Pt is a 74 y/o male presenting w/ c/o R hip/groin pain x 6 months w/ no known MOI.  He rates his pain at a 3-4/10 and describes his pain as aching.  Pain of is worse with hip flexion.  No significant radiating pain weakness or numbness distally.  Radiating pain: No Low back pain: yes, 9/10 R LE weakness: No R LE numbness/tingling: No Aggravating factors: Walking; Getting out of bed; hip flexion;  Treatments tried: Nothing other than prescription meds he's currently taking  Diagnostic testing: R hip XR- 07/06/19  Past medical history, Surgical history, Family history, Social history, Allergies, and medications have been entered into the medical record, reviewed. History lumbar surgery and right total knee replacement.  Review of Systems: No new headache, visual changes, nausea, vomiting, diarrhea, constipation, dizziness, abdominal pain, skin rash, fevers, chills, night sweats, weight loss, swollen lymph nodes, body aches, joint swelling, muscle aches, chest pain, shortness of breath, mood changes, visual or auditory hallucinations.   Objective:    Vitals:   07/21/19 1447  BP: 120/70  Pulse: 84  SpO2: 94%   General: Well Developed, well nourished, and in no acute distress.   MSK: Right hip: Normal-appearing Motion normal flexion and external rotation.  Limited internal rotation with some pain. Intact strength. Normal gait.  Lab and Radiology Results EXAM: DG HIP (WITH OR WITHOUT PELVIS) 2-3V RIGHT  COMPARISON:  None.  FINDINGS: Hip joint spaces are maintained. Both femoral heads are seated. Mild bilateral acetabular spurring. No evidence of fracture. Pubic rami are intact. No evidence of a vascular necrosis or focal bone lesion. The sacroiliac joints  are congruent.  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   Electronically Signed   By: Keith Rake M.D.   On: 07/07/2019 02:56 I, Lynne Leader, personally (independently) visualized and performed the interpretation of the images attached in this note.   Impression and Recommendations:    Assessment and Plan: 74 y.o. male with right anterior hip pain.  Pain due to DJD or labrum tear or femoral acetabular impingement most likely.  Doubtful for lumbar radiculopathy.  Patient did not have significant changes at this level on MRI from June 2019. Discussed options.  Patient has a symptoms are not particularly bothersome enough to warrant injection.  Next step would be diagnostic and hopefully therapeutic femoral acetabular injection.  Additionally given the multiplicity of his different pain complaints from his back to his knees to his hip he may benefit from aquatic physical therapy.  I gave him some information about this and he will think about it.  If he like to pursue this am happy to place a referral.  Check back with me at any time.   Orders Placed This Encounter  Procedures  . [place] LIMITED JOINT SPACE STRUCTURES LOW RIGHT(NO LINKED CHARGES)    Order Specific Question:   Reason for Exam (SYMPTOM  OR DIAGNOSIS REQUIRED)    Answer:   R hip pain    Order Specific Question:   Preferred imaging location?    Answer:   [facility]   No orders of the defined types were placed in this encounter.   Discussed warning signs or symptoms. Please see discharge instructions. Patient expresses understanding.  The above documentation has been reviewed and is accurate and complete Lynne Leader

## 2019-07-21 NOTE — Telephone Encounter (Signed)
Ok to refill 

## 2019-07-29 ENCOUNTER — Ambulatory Visit (INDEPENDENT_AMBULATORY_CARE_PROVIDER_SITE_OTHER): Payer: Medicare Other | Admitting: *Deleted

## 2019-07-29 DIAGNOSIS — I255 Ischemic cardiomyopathy: Secondary | ICD-10-CM

## 2019-07-29 LAB — CUP PACEART REMOTE DEVICE CHECK
Battery Remaining Longevity: 61 mo
Battery Voltage: 2.98 V
Brady Statistic AP VP Percent: 2 %
Brady Statistic AP VS Percent: 0.03 %
Brady Statistic AS VP Percent: 92.43 %
Brady Statistic AS VS Percent: 5.54 %
Brady Statistic RA Percent Paced: 2.01 %
Brady Statistic RV Percent Paced: 38.29 %
Date Time Interrogation Session: 20210311033324
HighPow Impedance: 62 Ohm
Implantable Lead Implant Date: 20181108
Implantable Lead Implant Date: 20181108
Implantable Lead Implant Date: 20181108
Implantable Lead Location: 753858
Implantable Lead Location: 753859
Implantable Lead Location: 753860
Implantable Lead Model: 4298
Implantable Lead Model: 5076
Implantable Pulse Generator Implant Date: 20181108
Lead Channel Impedance Value: 1026 Ohm
Lead Channel Impedance Value: 1140 Ohm
Lead Channel Impedance Value: 1178 Ohm
Lead Channel Impedance Value: 172.541
Lead Channel Impedance Value: 182.4 Ohm
Lead Channel Impedance Value: 221.559
Lead Channel Impedance Value: 268.078
Lead Channel Impedance Value: 292.657
Lead Channel Impedance Value: 304 Ohm
Lead Channel Impedance Value: 399 Ohm
Lead Channel Impedance Value: 418 Ohm
Lead Channel Impedance Value: 456 Ohm
Lead Channel Impedance Value: 513 Ohm
Lead Channel Impedance Value: 551 Ohm
Lead Channel Impedance Value: 589 Ohm
Lead Channel Impedance Value: 608 Ohm
Lead Channel Impedance Value: 665 Ohm
Lead Channel Impedance Value: 817 Ohm
Lead Channel Pacing Threshold Amplitude: 0.5 V
Lead Channel Pacing Threshold Amplitude: 0.625 V
Lead Channel Pacing Threshold Amplitude: 2.75 V
Lead Channel Pacing Threshold Pulse Width: 0.4 ms
Lead Channel Pacing Threshold Pulse Width: 0.4 ms
Lead Channel Pacing Threshold Pulse Width: 1 ms
Lead Channel Sensing Intrinsic Amplitude: 13.625 mV
Lead Channel Sensing Intrinsic Amplitude: 13.625 mV
Lead Channel Sensing Intrinsic Amplitude: 2.625 mV
Lead Channel Sensing Intrinsic Amplitude: 2.625 mV
Lead Channel Setting Pacing Amplitude: 2 V
Lead Channel Setting Pacing Amplitude: 2.5 V
Lead Channel Setting Pacing Amplitude: 2.75 V
Lead Channel Setting Pacing Pulse Width: 0.4 ms
Lead Channel Setting Pacing Pulse Width: 1 ms
Lead Channel Setting Sensing Sensitivity: 0.3 mV

## 2019-07-30 NOTE — Progress Notes (Signed)
ICD Remote  

## 2019-08-16 ENCOUNTER — Other Ambulatory Visit: Payer: Self-pay | Admitting: *Deleted

## 2019-08-16 ENCOUNTER — Ambulatory Visit (INDEPENDENT_AMBULATORY_CARE_PROVIDER_SITE_OTHER): Payer: Medicare Other

## 2019-08-16 DIAGNOSIS — I5022 Chronic systolic (congestive) heart failure: Secondary | ICD-10-CM

## 2019-08-16 DIAGNOSIS — Z95810 Presence of automatic (implantable) cardiac defibrillator: Secondary | ICD-10-CM

## 2019-08-16 NOTE — Telephone Encounter (Signed)
Last Visit:12/22/2018 Next Visit:09/01/19 UDS: 06/22/19 Narc Agreement: 06/22/19  Last Fill 07/21/19  Okay to refill Tramadol?

## 2019-08-17 ENCOUNTER — Telehealth: Payer: Self-pay

## 2019-08-17 DIAGNOSIS — Z95810 Presence of automatic (implantable) cardiac defibrillator: Secondary | ICD-10-CM

## 2019-08-17 DIAGNOSIS — I5022 Chronic systolic (congestive) heart failure: Secondary | ICD-10-CM | POA: Diagnosis not present

## 2019-08-17 MED ORDER — TRAMADOL HCL 50 MG PO TABS: ORAL_TABLET | ORAL | 0 refills | Status: DC

## 2019-08-17 NOTE — Progress Notes (Signed)
EPIC Encounter for ICM Monitoring  Patient Name: [name] is a 74 y.o. male Date: 08/17/2019 Primary Care Physican: Biagio Borg, MD Primary Cardiologist:[place] Electrophysiologist:[place] Bi-V Pacing:93.6% 07/21/2019 Office Weight:190lbs     Spoke with patient.  He asked if any afib showed on report on 3/26 or 3/27 and advised none was recorded.  He felt lightheaded that lasted about 1/2 day but then returned to normal.  He denies any fluid symptoms.   Optivol thoracic impedance slightly below baseline normal.  Takes Spirolactone 25 mg take 0.5 tablet daily.  Recommendations: No changes and encouraged to call if experiencing any fluid symptoms.  Follow-up plan: ICM clinic phone appointment on5/07/2019. 91 day device clinic remote transmission 10/28/2019.    Copy of ICM check sent to [facility].   3 month ICM trend: 08/16/2019    1 Year ICM trend:       [name], RN 08/17/2019 2:33 PM

## 2019-08-17 NOTE — Telephone Encounter (Signed)
Remote ICM transmission received.  Attempted call to patient regarding ICM remote transmission and left detailed message per DPR.  Advised to return call for any fluid symptoms or questions.  

## 2019-08-24 ENCOUNTER — Encounter: Payer: Self-pay | Admitting: Gastroenterology

## 2019-08-25 NOTE — Progress Notes (Signed)
Office Visit Note  Patient: [name]             Date of Birth: 06/26/1945           MRN: [id number]             PCP: Biagio Borg, MD Referring: Biagio Borg, MD Visit Date: 09/01/2019 Occupation: @GUAROCC @  Subjective:  Pain in all the joints.   History of Present Illness: [name] is a 74 y.o. male with history of rheumatoid arthritis and osteoarthritis.  He states he was seen by Dr. Jenny Reichmann in February for right inguinal pain.  He states Dr. Jenny Reichmann suspected right hip joint arthritis and referred him to Dr. Georgina Snell.  He had x-ray of his right hip joint.  Continues to have some discomfort in his other joints.  But no joint swelling.  He reports some pedal edema.  He has been taking hydroxychloroquine once a day 5 days a week which he is tolerating well.  He takes Tramadol once a day to relieve pain.  He states his pain level drops down from 5 to 3 with tramadol on the scale of 0-10.  He states he is in a constant pain.  Activities of Daily Living:  Patient reports morning stiffness for 2 hours.   Patient Denies nocturnal pain.  Difficulty dressing/grooming: Denies Difficulty climbing stairs: Reports Difficulty getting out of chair: Reports Difficulty using hands for taps, buttons, cutlery, and/or writing: Reports  Review of Systems  Constitutional: Positive for fatigue. Negative for night sweats.  HENT: Negative for mouth sores, mouth dryness and nose dryness.   Eyes: Positive for dryness. Negative for redness.  Respiratory: Negative for shortness of breath and difficulty breathing.   Cardiovascular: Positive for swelling in legs/feet. Negative for chest pain, palpitations, hypertension and irregular heartbeat.  Gastrointestinal: Negative for constipation and diarrhea.  Endocrine: Negative for excessive thirst and increased urination.  Genitourinary: Negative for difficulty urinating.  Musculoskeletal: Positive for arthralgias, joint pain and morning stiffness. Negative  for joint swelling, myalgias, muscle weakness, muscle tenderness and myalgias.  Skin: Negative for color change, rash, hair loss, nodules/bumps, skin tightness, ulcers and sensitivity to sunlight.  Allergic/Immunologic: Negative for susceptible to infections.  Neurological: Positive for numbness. Negative for dizziness, fainting, memory loss, night sweats and weakness ( ).  Hematological: Negative for bruising/bleeding tendency and swollen glands.  Psychiatric/Behavioral: Negative for depressed mood and sleep disturbance. The patient is not nervous/anxious.     PMFS History:  Patient Active Problem List   Diagnosis Date Noted  . Pericarditis 07/06/2019  . Right hip pain 07/06/2019  . Acute chest pain 01/24/2019  . Prediabetes 12/24/2018  . Rheumatoid arthritis ([place]) 12/24/2018  . Preventative health care 06/25/2018  . Incarcerated ventral hernia 03/20/2018  . DCM (dilated cardiomyopathy) ([place]) 02/11/2017  . Chronic systolic heart failure ([place]) 01/15/2017  . History of pneumonia 10/23/2016  . Osteoarthritis 10/23/2016  . Primary osteoarthritis of both feet 10/04/2016  . Primary osteoarthritis of left knee 10/04/2016  . DDD (degenerative disc disease), lumbar 10/04/2016  . Hemoptysis 05/27/2016  . Overweight (BMI 25.0-29.9) 03/14/2016  . 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  . Parotid mass 08/23/2015  . Dyspnea 11/29/2014  . Synovial cyst of lumbar facet joint 09/26/2014  . Incisional hernia, without obstruction or gangrene 12/06/2013  . Nausea alone 08/27/2013  . Colon cancer screening 08/27/2013  . 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 Benign prostatic hyperplasia with urinary obstruction 06/18/2010  . Other specified disorder of stomach and  duodenum 12/16/2008  . 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 GERD 09/02/2008  . COLONIC POLYPS, HYPERPLASTIC 05/24/2007  . Obstructive sleep apnea 05/24/2007  . ALLERGIC RHINITIS 05/24/2007  . ESOPHAGEAL STRICTURE 05/24/2007    . HIATAL HERNIA 05/24/2007  . Hyperlipidemia 02/06/2007  . Essential hypertension 02/06/2007  . CAD (coronary artery disease) 02/06/2007  . Primary osteoarthritis of both hands 02/06/2007  . Neck pain on right side 02/06/2007    Past Medical History:  Diagnosis Date  . AICD (automatic cardioverter/defibrillator) present   . Allergic rhinitis   . Allergy   . Anginal pain ([place]) 05/26/14   chest pain after chasing dog  . Atypical nevus of back 04/27/2003   moderate - mid lower back  . Basal cell carcinoma 01/26/2008   right cheek - MOHs  . CAD (coronary artery disease)    hx of stent- 2005 RCA  . Cataract    beginning stage both eyes  . CHF (congestive heart failure) (HCC)    pacemaker [employer]    . Chronic back pain    intermittent  . Constipation   . Dizziness   . Dysrhythmia    right bundle branch block   . Esophageal stricture   . GERD (gastroesophageal reflux disease)   . Hemoptysis   . Hiatal hernia   . History of colonic polyps    hyperplastic  . HTN (hypertension)   . Hyperlipidemia   . Hypertrophy of prostate with urinary obstruction and other lower urinary tract symptoms (LUTS)   . OA (osteoarthritis)   . OSA (obstructive sleep apnea)    cpap- 10   . 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 Pericarditis 07/06/2019  . Pneumonia   . Pre-diabetes    no medications  . Shortness of breath    with exertion  . Sleep apnea    cpap  . Testicular hypofunction     Family History  Problem Relation Age of Onset  . Coronary artery disease Father   . Diabetes Father   . Sudden death Father        due to heart disease  . Heart disease Father   . Heart disease Mother   . Hypertension Other   . Stomach cancer Paternal Grandmother   . COPD Brother   . Arthritis Brother   . Heart failure Brother   . Cancer Brother        lung   . Arthritis Daughter   . Colon cancer Neg Hx   .  Esophageal cancer Neg Hx   . Colon polyps Neg Hx   . Ulcerative colitis Neg Hx    Past Surgical History:  Procedure Laterality Date  . BIV ICD INSERTION CRT-D N/A 03/27/2017   Procedure: BIV ICD INSERTION CRT-D;  Surgeon: Constance Haw, MD;  Location: [facility];  Service: Cardiovascular;  Laterality: N/A;  . CARDIAC CATHETERIZATION     '05, last 2009, showing patent RCA stent  . COLONOSCOPY  12/2007   HYPERPLASTIC POLYP  . COLONOSCOPY WITH PROPOFOL N/A 06/02/2014   Procedure: COLONOSCOPY WITH PROPOFOL;  Surgeon: [name], MD;  Location: [facility];  Service: Endoscopy;  Laterality: N/A;  . CORONARY ANGIOPLASTY  08/2003  . ESOPHAGOGASTRODUODENOSCOPY (EGD) WITH PROPOFOL N/A 06/02/2014   Procedure: ESOPHAGOGASTRODUODENOSCOPY (EGD) WITH PROPOFOL;  Surgeon: [name], MD;  Location: [facility];  Service: Endoscopy;  Laterality: N/A;  .  hernia surgery x 3     Bilateral Inguinal, Umbicial  . INSERTION OF MESH N/A 03/20/2018   Procedure: INSERTION OF MESH;  Surgeon: Alphonsa Overall, MD;  Location: [facility];  Service: General;  Laterality: N/A;  . IRRIGATION AND DEBRIDEMENT ABSCESS Left 08/14/2012   Procedure: IRRIGATION AND DEBRIDEMENT LEFT INGUINAL BOIL ;  Surgeon: Ailene Rud, MD;  Location: [facility];  Service: Urology;  Laterality: Left;  . JOINT REPLACEMENT Right 2017  . KNEE ARTHROPLASTY    . LEFT HEART CATH AND CORONARY ANGIOGRAPHY N/A 01/24/2019   Procedure: LEFT HEART CATH AND CORONARY ANGIOGRAPHY;  Surgeon: Martinique, Peter M, MD;  Location: [facility];  Service: Cardiovascular;  Laterality: N/A;  . LEFT HEART CATHETERIZATION WITH CORONARY ANGIOGRAM N/A 05/26/2014   Procedure: LEFT HEART CATHETERIZATION WITH CORONARY ANGIOGRAM;  Surgeon: Blane Ohara, MD;  Location: [facility];  Service: Cardiovascular;  Laterality: N/A;  . LUMBAR LAMINECTOMY/DECOMPRESSION MICRODISCECTOMY Left 09/26/2014   Procedure: Lumbar Laminectomy for resection of synovial cyst   Lumbar five- sacral one left;  Surgeon: Kary Kos, MD;  Location: [facility];  Service: Neurosurgery;  Laterality: Left;  . MOLE REMOVAL Left 03/20/2018   Procedure: MOLE REMOVAL;  Surgeon: Alphonsa Overall, MD;  Location: [facility];  Service: General;  Laterality: Left;  . Oral surg to removed growth from ?sinus  10/2010   ?Dermoid removed by DrRiggs  . PACEMAKER INSERTION  04/03/2017  . POLYPECTOMY    . RCA stenting     '05 RCA  . right toe surgery  Right    Cyst   . RIGHT/LEFT HEART CATH AND CORONARY ANGIOGRAPHY N/A 01/15/2017   Procedure: RIGHT/LEFT HEART CATH AND CORONARY ANGIOGRAPHY;  Surgeon: [name], MD;  Location: [facility];  Service: Cardiovascular;  Laterality: N/A;  . s/p right knee arthroscopy  2005  . thumb surgery Right   . TOTAL KNEE ARTHROPLASTY Right 03/12/2016   Procedure: RIGHT TOTAL KNEE ARTHROPLASTY;  Surgeon: Paralee Cancel, MD;  Location: [facility];  Service: Orthopedics;  Laterality: Right;  . UPPER GASTROINTESTINAL ENDOSCOPY    . VENTRAL HERNIA REPAIR N/A 03/20/2018   Procedure: LAPAROSCOPIC VENTRAL INCISIONAL  HERNIA ERAS PATHWAY;  Surgeon: Alphonsa Overall, MD;  Location: [facility];  Service: General;  Laterality: N/A;   Social History   Social History Narrative  . Not on file   Immunization History  Administered Date(s) Administered  . Influenza Split 03/21/2011  . Influenza Whole 04/06/2009, 04/09/2010  . Influenza, High Dose Seasonal PF 04/20/2015, 04/15/2016, 02/21/2017  . Influenza,inj,Quad PF,6+ Mos 03/24/2014  . Influenza-Unspecified 02/26/2018, 02/18/2019  . Pneumococcal Conjugate-13 11/29/2014  . Pneumococcal Polysaccharide-23 06/25/2018  . Tdap 11/29/2014  . Zoster 05/20/2006  . Zoster Recombinat (Shingrix) 06/07/2018, 09/26/2018     Objective: Vital Signs: BP (!) 108/56 (BP Location: Left Arm, Patient Position: Sitting, Cuff Size: Normal)   Pulse 76   Resp 18   Ht 5\' 7"  (1.702 m)   Wt 190 lb 9.6 oz (86.5 kg)   BMI 29.85 kg/m     Physical Exam Vitals and nursing note reviewed.  Constitutional:      Appearance: He is well-developed.  HENT:     Head: Normocephalic and atraumatic.  Eyes:     Conjunctiva/sclera: Conjunctivae normal.     Pupils: Pupils are equal, round, and reactive to light.  Cardiovascular:     Rate and Rhythm: Normal rate and regular rhythm.     Heart sounds: Normal heart sounds.  Pulmonary:  Effort: Pulmonary effort is normal.     Breath sounds: Normal breath sounds.  Abdominal:     General: Bowel sounds are normal.     Palpations: Abdomen is soft.  Musculoskeletal:     Cervical back: Normal range of motion and neck supple.  Skin:    General: Skin is warm and dry.     Capillary Refill: Capillary refill takes less than 2 seconds.  Neurological:     Mental Status: He is alert and oriented to person, place, and time.  Psychiatric:        Behavior: Behavior normal.      Musculoskeletal Exam: He had some discomfort range of motion of his cervical lumbar spine.  Shoulder joints, elbow joints, wrist joints with good range of motion.  He had bilateral CMC, PIP and DIP thickening.  He had right second and third MCP thickening with no synovitis.  He had good range of motion of hip joints with minimal discomfort.  Knee joints and ankle joints in good range of motion with no synovitis. CDAI Exam: CDAI Score: -- Patient Global: --; Provider Global: -- Swollen: --; Tender: -- Joint Exam 09/01/2019   No joint exam has been documented for this visit   There is currently no information documented on the homunculus. Go to the Rheumatology activity and complete the homunculus joint exam.  Investigation: No additional findings.  Imaging: No results found.  Recent Labs: Lab Results  Component Value Date   WBC 4.8 06/22/2019   HGB 14.8 06/22/2019   PLT 247 06/22/2019   NA 137 06/22/2019   K 3.9 06/22/2019   CL 103 06/22/2019   CO2 25 06/22/2019   GLUCOSE 136 (H) 06/22/2019   BUN 15  06/22/2019   CREATININE 0.93 06/22/2019   BILITOT 0.9 06/22/2019   ALKPHOS 62 01/24/2019   AST 22 06/22/2019   ALT 21 06/22/2019   PROT 6.5 06/22/2019   ALBUMIN 3.7 01/24/2019   CALCIUM 9.7 06/22/2019   GFRAA 93 06/22/2019    Speciality Comments: PLQ eye exam: 02/24/2019 normal. Dr. Francis Dowse. Follow up in 1 year.  Procedures:  No procedures performed Allergies: Sulfonamide derivatives   Assessment / Plan:     Visit Diagnoses: Rheumatoid arthritis involving multiple sites with positive rheumatoid factor (HCC) - CCP +, RF -, 14-3-3 eta negative, Sed rate WNL.  He has no synovitis on my examination.  He has been tolerating Plaquenil well without any side effects.  His pain is basically from underlying osteoarthritis.  High risk medication use - PLQ 200 mg 1 tablet BID M-F. eye exam: 02/24/2019.  His labs have been normal.  He will continue to get his labs monitored.  Medication monitoring encounter - tramadol 1 TABLET(50 MG) BY MOUTH AT BEDTIME AS NEEDEDUDS & narc agreement: 06/22/2019.  Patient states the tramadol helps him to function on a daily basis.  He still continues to be in discomfort despite taking medications.  Side effects of tramadol were reviewed.  Primary osteoarthritis of both hands-he has severe osteoarthritis in his hands which causes discomfort.  Primary osteoarthritis of left knee-he has chronic pain.  Status post right knee replacement-he continues to have discomfort and stiffness.  Primary osteoarthritis of both feet-use of proper fitting shoes was discussed.  DDD (degenerative disc disease), lumbar-he has been experiencing increased lower back pain.  Pain in right hip-he recently had some discomfort in his right lower quadrant.  The x-ray of the hip joint was obtained which showed mild osteoarthritic changes.  He has appointment coming up with his gastroenterologist.  Other medical problems are listed as follows:  History of coronary artery disease -  followed by cardiologist.  Pacemaker  History of CHF (congestive heart failure)  History of hypertension  History of hyperlipidemia  History of gastroesophageal reflux (GERD)  History of gastric polyp  History of colon polyps  History of sleep apnea  History of BPH  Orders: No orders of the defined types were placed in this encounter.  No orders of the defined types were placed in this encounter.   Face-to-face time spent with patient was 30 minutes. Greater than 50% of time was spent in counseling and coordination of care.  Follow-Up Instructions: Return in about 5 months (around 02/01/2020) for Rheumatoid arthritis, Osteoarthritis.   Bo Merino, MD  Note - This record has been created using Editor, commissioning.  Chart creation errors have been sought, but may not always  have been located. Such creation errors do not reflect on  the standard of medical care.

## 2019-09-01 ENCOUNTER — Ambulatory Visit (INDEPENDENT_AMBULATORY_CARE_PROVIDER_SITE_OTHER): Payer: Medicare Other | Admitting: Rheumatology

## 2019-09-01 ENCOUNTER — Other Ambulatory Visit: Payer: Self-pay

## 2019-09-01 ENCOUNTER — Encounter: Payer: Self-pay | Admitting: Rheumatology

## 2019-09-01 VITALS — BP 108/56 | HR 76 | Resp 18 | Ht 67.0 in | Wt 190.6 lb

## 2019-09-01 DIAGNOSIS — Z8719 Personal history of other diseases of the digestive system: Secondary | ICD-10-CM

## 2019-09-01 DIAGNOSIS — M19071 Primary osteoarthritis, right ankle and foot: Secondary | ICD-10-CM | POA: Diagnosis not present

## 2019-09-01 DIAGNOSIS — M25551 Pain in right hip: Secondary | ICD-10-CM

## 2019-09-01 DIAGNOSIS — Z8639 Personal history of other endocrine, nutritional and metabolic disease: Secondary | ICD-10-CM

## 2019-09-01 DIAGNOSIS — M5136 Other intervertebral disc degeneration, lumbar region: Secondary | ICD-10-CM | POA: Diagnosis not present

## 2019-09-01 DIAGNOSIS — Z5181 Encounter for therapeutic drug level monitoring: Secondary | ICD-10-CM

## 2019-09-01 DIAGNOSIS — Z96651 Presence of right artificial knee joint: Secondary | ICD-10-CM

## 2019-09-01 DIAGNOSIS — M0579 Rheumatoid arthritis with rheumatoid factor of multiple sites without organ or systems involvement: Secondary | ICD-10-CM

## 2019-09-01 DIAGNOSIS — M1712 Unilateral primary osteoarthritis, left knee: Secondary | ICD-10-CM | POA: Diagnosis not present

## 2019-09-01 DIAGNOSIS — Z8679 Personal history of other diseases of the circulatory system: Secondary | ICD-10-CM | POA: Diagnosis not present

## 2019-09-01 DIAGNOSIS — I255 Ischemic cardiomyopathy: Secondary | ICD-10-CM

## 2019-09-01 DIAGNOSIS — Z79899 Other long term (current) drug therapy: Secondary | ICD-10-CM | POA: Diagnosis not present

## 2019-09-01 DIAGNOSIS — M19041 Primary osteoarthritis, right hand: Secondary | ICD-10-CM | POA: Diagnosis not present

## 2019-09-01 DIAGNOSIS — Z8669 Personal history of other diseases of the nervous system and sense organs: Secondary | ICD-10-CM

## 2019-09-01 DIAGNOSIS — Z87438 Personal history of other diseases of male genital organs: Secondary | ICD-10-CM

## 2019-09-01 DIAGNOSIS — Z86010 Personal history of colonic polyps: Secondary | ICD-10-CM

## 2019-09-01 DIAGNOSIS — Z950 Presence of cardiac pacemaker: Secondary | ICD-10-CM

## 2019-09-01 DIAGNOSIS — M19072 Primary osteoarthritis, left ankle and foot: Secondary | ICD-10-CM

## 2019-09-01 DIAGNOSIS — M19042 Primary osteoarthritis, left hand: Secondary | ICD-10-CM

## 2019-09-01 NOTE — Patient Instructions (Signed)
Standing Labs We placed an order today for your standing lab work.    Please come back and get your standing labs in July  We have open lab daily Monday through Thursday from 8:30-12:30 PM and 1:30-4:30 PM and Friday from 8:30-12:30 PM and 1:30-4:00 PM at the office of Dr. Bo Merino.   You may experience shorter wait times on Monday and Friday afternoons. The office is located at [address], [place], [place], [place] [address] No appointment is necessary.   Labs are drawn by [facility].  You may receive a bill from [facility] for your lab work.  If you wish to have your labs drawn at another location, please call the office 24 hours in advance to send orders.  If you have any questions regarding directions or hours of operation,  please call [phone].   Just as a reminder please drink plenty of water prior to coming for your lab work. Thanks!

## 2019-09-02 DIAGNOSIS — L218 Other seborrheic dermatitis: Secondary | ICD-10-CM | POA: Diagnosis not present

## 2019-09-02 DIAGNOSIS — L570 Actinic keratosis: Secondary | ICD-10-CM | POA: Diagnosis not present

## 2019-09-08 DIAGNOSIS — H2513 Age-related nuclear cataract, bilateral: Secondary | ICD-10-CM | POA: Diagnosis not present

## 2019-09-08 DIAGNOSIS — Z79899 Other long term (current) drug therapy: Secondary | ICD-10-CM | POA: Diagnosis not present

## 2019-09-08 DIAGNOSIS — D23121 Other benign neoplasm of skin of left upper eyelid, including canthus: Secondary | ICD-10-CM | POA: Diagnosis not present

## 2019-09-08 DIAGNOSIS — H35033 Hypertensive retinopathy, bilateral: Secondary | ICD-10-CM | POA: Diagnosis not present

## 2019-09-13 ENCOUNTER — Telehealth: Payer: Self-pay

## 2019-09-13 DIAGNOSIS — M1990 Unspecified osteoarthritis, unspecified site: Secondary | ICD-10-CM

## 2019-09-13 MED ORDER — HYDROXYCHLOROQUINE SULFATE 200 MG PO TABS: ORAL_TABLET | ORAL | 0 refills | Status: DC

## 2019-09-13 NOTE — Telephone Encounter (Signed)
Refill request received via fax from [facility] on [address] for plaquenil.   Last Visit: 09/01/2019  Next Visit: 02/03/2020 Labs: 06/22/2019 Glucose is elevated-136. Rest of CMP WNL. CBC WNL. Eye exam: 02/24/2019   Okay to refill per Dr. Estanislado Pandy.

## 2019-09-15 DIAGNOSIS — H53001 Unspecified amblyopia, right eye: Secondary | ICD-10-CM | POA: Diagnosis not present

## 2019-09-15 DIAGNOSIS — D485 Neoplasm of uncertain behavior of skin: Secondary | ICD-10-CM | POA: Diagnosis not present

## 2019-09-15 DIAGNOSIS — L209 Atopic dermatitis, unspecified: Secondary | ICD-10-CM | POA: Diagnosis not present

## 2019-09-20 ENCOUNTER — Ambulatory Visit (INDEPENDENT_AMBULATORY_CARE_PROVIDER_SITE_OTHER): Payer: Medicare Other

## 2019-09-20 DIAGNOSIS — Z95810 Presence of automatic (implantable) cardiac defibrillator: Secondary | ICD-10-CM

## 2019-09-20 DIAGNOSIS — I5022 Chronic systolic (congestive) heart failure: Secondary | ICD-10-CM | POA: Diagnosis not present

## 2019-09-21 NOTE — Progress Notes (Signed)
EPIC Encounter for ICM Monitoring  Patient Name: [name] is a 74 y.o. male Date: 09/21/2019 Primary Care Physican: Biagio Borg, MD Primary Cardiologist:[place] Electrophysiologist:[place] Bi-V Pacing:92.5% 07/21/2019 Office Weight:190lbs    Clinical Status (16-Aug-2019 to 20-Sep-2019)  AT/AF 2  Time in AT/AF 0.2 hr/day (1.0%) (taking Eliquis)  Longest AT/AF 9 hours    Attempted call to patient and unable to reach.  Left detailed message per DPR regarding transmission. Transmission reviewed.   Optivol thoracic impedance slightly below baseline normal.  Takes Spirolactone 25 mg take 0.5 tablet daily.  Recommendations: Left voice mail with ICM number and encouraged to call if experiencing any fluid symptoms.  Follow-up plan: ICM clinic phone appointment on6/03/2020. 91 day device clinic remote transmission 10/28/2019.    Copy of ICM check sent to [facility].   3 month ICM trend: 09/20/2019    1 Year ICM trend:       [name], RN 09/21/2019 5:16 PM

## 2019-09-24 ENCOUNTER — Other Ambulatory Visit: Payer: Self-pay | Admitting: *Deleted

## 2019-09-24 MED ORDER — TRAMADOL HCL 50 MG PO TABS: ORAL_TABLET | ORAL | 0 refills | Status: DC

## 2019-09-24 NOTE — Telephone Encounter (Signed)
Refill request received via fax  Last Visit: 09/01/2019  Next Visit: 02/03/2020 UDS: 06/22/2019 Narc Agreement: 06/22/19  Last Fill 08/17/2019  Okay to refill Tramadol?

## 2019-09-29 ENCOUNTER — Ambulatory Visit (INDEPENDENT_AMBULATORY_CARE_PROVIDER_SITE_OTHER): Payer: Medicare Other | Admitting: Gastroenterology

## 2019-09-29 ENCOUNTER — Encounter: Payer: Self-pay | Admitting: Gastroenterology

## 2019-09-29 ENCOUNTER — Other Ambulatory Visit: Payer: Medicare Other

## 2019-09-29 VITALS — BP 118/66 | HR 74 | Temp 98.9°F | Ht 67.0 in | Wt 198.0 lb

## 2019-09-29 DIAGNOSIS — R197 Diarrhea, unspecified: Secondary | ICD-10-CM

## 2019-09-29 DIAGNOSIS — I255 Ischemic cardiomyopathy: Secondary | ICD-10-CM

## 2019-09-29 MED ORDER — VANCOMYCIN HCL 125 MG PO CAPS: 125.0000 mg | ORAL_CAPSULE | Freq: Four times a day (QID) | ORAL | 0 refills | Status: AC

## 2019-09-29 NOTE — Progress Notes (Signed)
Review of pertinent gastrointestinal problems: 1.  Routine risk for colon cancer.  Colonoscopy Dr. Ardis Hughs January 2016 was completely normal.  Colonoscopy 2009 Dr. Verl Blalock removed a single hyperplastic polyp. 2. incidentally noted duodenal polyp partially  resected endoscopically TVA (09/2008) (Dr. Henrene Pastor),  repeat procedure December 2012 found small residual  tubular adenoma at the site, appeared to be  completely removed endoscopically, 2012 repeat EGD  with small residual adenoma at the site removed.  repeat EGD 2016 with small adenoma at the site removed.  EGD Dr. Ardis Hughs March 2019 found small amount of apparent recurrent polypoid growth removed with biopsy forceps, this was tubular adenoma on pathology.  I recommended repeat EGD at 3-year interval   HPI: This is a very pleasant 74 year old man who has had 6 weeks of acute diarrhea associated with loose stools, fecal urgency, one episode of fecal incontinence.  He has not seen any overt bleeding.  He took 4 pills of antibiotics around the time of dental cleaning procedure about 2 to 4 weeks prior to his bowel change.  For the bowel change he was having 1 bowel movement once daily really never had troubles with his bowels.  It has always been solid until recently.  His weight is up about 10 pounds in the past year or so.  He has also had some right groin pain that is constant.  His primary care physician thought it was hip pain, referred him to sports medicine and also rheumatology ".  Both of those teams did not think that his groin pain was related to hip issue.  He does not see any bulging at the site.  He has no dysuria.   He is on Eliquis for paroxysmal atrial fibrillation.  Also he has history of chronic systolic heart failure however after implanted I believe pacer defibrillator his LV systolic returned to normal.  Most recent echocardiogram was September 2020 and his LV ejection fraction was 50 to 55%  Blood work February 2021 shows  hemoglobin A1c 5.9, complete metabolic profile was normal and CBC was normal.   ROS: complete GI ROS as described in HPI, all other review negative.  Constitutional:  No unintentional weight loss   Past Medical History:  Diagnosis Date  . AICD (automatic cardioverter/defibrillator) present   . Allergic rhinitis   . Allergy   . Anginal pain ([place]) 05/26/14   chest pain after chasing dog  . Atypical nevus of back 04/27/2003   moderate - mid lower back  . Basal cell carcinoma 01/26/2008   right cheek - MOHs  . CAD (coronary artery disease)    hx of stent- 2005 RCA  . Cataract    beginning stage both eyes  . CHF (congestive heart failure) (HCC)    pacemaker Med[employer]onic    . Chronic back pain    intermittent  . Constipation   . Dizziness   . Dysrhythmia    right bundle branch block   . Esophageal stricture   . GERD (gastroesophageal reflux disease)   . Hemoptysis   . Hiatal hernia   . History of colonic polyps    hyperplastic  . HTN (hypertension)   . Hyperlipidemia   . Hypertrophy of prostate with urinary obstruction and other lower urinary tract symptoms (LUTS)   . OA (osteoarthritis)   . OSA (obstructive sleep apnea)    cpap- 10   . 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  Pericarditis 07/06/2019  . Pneumonia   . Pre-diabetes    no medications  . Shortness of breath    with exertion  . Sleep apnea    cpap  . Testicular hypofunction     Past Surgical History:  Procedure Laterality Date  . BIV ICD INSERTION CRT-D N/A 03/27/2017   Procedure: BIV ICD INSERTION CRT-D;  Surgeon: Constance Haw, MD;  Location: [place] [facility];  Service: Cardiovascular;  Laterality: N/A;  . CARDIAC CATHETERIZATION     '05, last 2009, showing patent RCA stent  . COLONOSCOPY  12/2007   HYPERPLASTIC POLYP  . COLONOSCOPY WITH PROPOFOL N/A 06/02/2014   Procedure: COLONOSCOPY WITH PROPOFOL;  Surgeon:  [name], MD;  Location: [facility];  Service: Endoscopy;  Laterality: N/A;  . CORONARY ANGIOPLASTY  08/2003  . ESOPHAGOGASTRODUODENOSCOPY (EGD) WITH PROPOFOL N/A 06/02/2014   Procedure: ESOPHAGOGASTRODUODENOSCOPY (EGD) WITH PROPOFOL;  Surgeon: [name], MD;  Location: [facility];  Service: Endoscopy;  Laterality: N/A;  . hernia surgery x 3     Bilateral Inguinal, Umbicial  . INSERTION OF MESH N/A 03/20/2018   Procedure: INSERTION OF MESH;  Surgeon: Alphonsa Overall, MD;  Location: [place];  Service: General;  Laterality: N/A;  . IRRIGATION AND DEBRIDEMENT ABSCESS Left 08/14/2012   Procedure: IRRIGATION AND DEBRIDEMENT LEFT INGUINAL BOIL ;  Surgeon: Ailene Rud, MD;  Location: [facility];  Service: Urology;  Laterality: Left;  . JOINT REPLACEMENT Right 2017  . KNEE ARTHROPLASTY    . LEFT HEART CATH AND CORONARY ANGIOGRAPHY N/A 01/24/2019   Procedure: LEFT HEART CATH AND CORONARY ANGIOGRAPHY;  Surgeon: Martinique, Peter M, MD;  Location: [place] [facility];  Service: Cardiovascular;  Laterality: N/A;  . LEFT HEART CATHETERIZATION WITH CORONARY ANGIOGRAM N/A 05/26/2014   Procedure: LEFT HEART CATHETERIZATION WITH CORONARY ANGIOGRAM;  Surgeon: Blane Ohara, MD;  Location: [facility];  Service: Cardiovascular;  Laterality: N/A;  . LUMBAR LAMINECTOMY/DECOMPRESSION MICRODISCECTOMY Left 09/26/2014   Procedure: Lumbar Laminectomy for resection of synovial cyst  Lumbar five- sacral one left;  Surgeon: Kary Kos, MD;  Location: [facility];  Service: Neurosurgery;  Laterality: Left;  . MOLE REMOVAL Left 03/20/2018   Procedure: MOLE REMOVAL;  Surgeon: Alphonsa Overall, MD;  Location: [place];  Service: General;  Laterality: Left;  . Oral surg to removed growth from ?sinus  10/2010   ?Dermoid removed by DrRiggs  . PACEMAKER INSERTION  04/03/2017  . POLYPECTOMY    . RCA stenting     '05 RCA  . right toe surgery  Right    Cyst   . RIGHT/LEFT HEART CATH AND CORONARY ANGIOGRAPHY N/A 01/15/2017    Procedure: RIGHT/LEFT HEART CATH AND CORONARY ANGIOGRAPHY;  Surgeon: [name], MD;  Location: [place] [facility];  Service: Cardiovascular;  Laterality: N/A;  . s/p right knee arthroscopy  2005  . thumb surgery Right   . TOTAL KNEE ARTHROPLASTY Right 03/12/2016   Procedure: RIGHT TOTAL KNEE ARTHROPLASTY;  Surgeon: Paralee Cancel, MD;  [facility];  Service: Orthopedics;  Laterality: Right;  . UPPER GASTROINTESTINAL ENDOSCOPY    . VENTRAL HERNIA REPAIR N/A 03/20/2018   Procedure: [place];  Surgeon: Alphonsa Overall, MD;  Location: [place];  Service: General;  Laterality: N/A;    Current Outpatient Medications  Medication Sig Dispense Refill  . acetaminophen (TYLENOL) 500 MG tablet Take 500 mg by mouth 2 (two) times daily.    Marland Kitchen atorvastatin (LIPITOR) 20 MG  tablet Take 1 tablet (20 mg total) by mouth daily. 90 tablet 3  . b complex vitamins tablet Take 1 tablet by mouth daily with lunch.     . calcium carbonate (OSCAL) 1500 (600 Ca) MG TABS tablet Take 600 mg by mouth every evening.    . carvedilol (COREG) 25 MG tablet TAKE 1 TABLET(25 MG) BY MOUTH TWICE DAILY WITH A MEAL 180 tablet 3  . CINNAMON PO Take 1 tablet by mouth daily with lunch.     . Coenzyme Q10 (CO Q 10) 100 MG CAPS Take 1 capsule by mouth daily with lunch.     . colchicine 0.6 MG tablet Take 1 tablet (0.6 mg total) by mouth daily. 30 tablet 0  . diclofenac sodium (VOLTAREN) 1 % GEL Apply 1 application topically 4 (four) times daily as needed (pain).   3  . ELIQUIS 5 MG TABS tablet TAKE 1 TABLET(5 MG) BY MOUTH TWICE DAILY 60 tablet 2  . ENTRESTO 49-51 MG TAKE 1 TABLET BY MOUTH 2 TIMES DAILY 99991111 tablet 2  . folic acid (FOLVITE) A999333 MCG tablet Take 400 mcg by mouth daily.     . Glucosamine HCl (GLUCOSAMINE PO) Take 1,500 mg by mouth every evening.    . hydrocortisone 2.5 % cream Apply 1 application topically 2 (two) times daily as needed (rash on nose).     . hydroxychloroquine  (PLAQUENIL) 200 MG tablet TAKE 1 TABLET BY MOUTH TWICE DAILY MONDAY THROUGH FRIDAY ONLY. DO NOT TAKE SATURDAY OR SUNDAY 120 tablet 0  . hydroxypropyl methylcellulose / hypromellose (ISOPTO TEARS / GONIOVISC) 2.5 % ophthalmic solution Place 1 drop into both eyes 4 (four) times daily as needed for dry eyes.     . Multiple Vitamin (MULTIVITAMIN) capsule Take 1 capsule by mouth daily.      . Multiple Vitamins-Minerals (OCUVITE PO) Take 1 tablet by mouth at bedtime.    . nitroGLYCERIN (NITROSTAT) 0.4 MG SL tablet Place 1 tablet (0.4 mg total) under the tongue every 5 (five) minutes x 3 doses as needed for chest pain. 25 tablet 0  . NON FORMULARY CPAP machine with sleep.    . pantoprazole (PROTONIX) 40 MG tablet TAKE 1 TABLET BY MOUTH EVERY DAY BEFORE BREAKFAST GENERIC EQUIVALENT FOR PROTONIX 90 tablet 3  . Probiotic Product (PROBIOTIC DAILY PO) Take 1 capsule by mouth daily with lunch.     . Saw Palmetto, Serenoa repens, (SAW PALMETTO PO) Take 1 capsule by mouth every evening.    Marland Kitchen spironolactone (ALDACTONE) 25 MG tablet TAKE 1/2 TABLET(12.5 MG) BY MOUTH DAILY 45 tablet 3  . tamsulosin (FLOMAX) 0.4 MG CAPS capsule Take 0.4 mg by mouth at bedtime.    . Testosterone 20 % CREA Apply 2 mLs topically daily. Rub on shoulder    . traMADol (ULTRAM) 50 MG tablet TAKE 1 TABLET(50 MG) BY MOUTH AT BEDTIME AS NEEDED 30 tablet 0  . TURMERIC PO Take 1 tablet by mouth daily with lunch.      No current facility-administered medications for this visit.    Allergies as of 09/29/2019 - Review Complete 09/29/2019  Allergen Reaction Noted  . Sulfonamide derivatives Rash 06/26/2010    Family History  Problem Relation Age of Onset  . Coronary artery disease Father   . Diabetes Father   . Sudden death Father        due to heart disease  . Heart disease Father   . Heart disease Mother   . Hypertension Other   .  Stomach cancer Paternal Grandmother   . COPD Brother   . Arthritis Brother   . Heart failure Brother    . Cancer Brother        lung   . Arthritis Daughter   . Colon cancer Neg Hx   . Esophageal cancer Neg Hx   . Colon polyps Neg Hx   . Ulcerative colitis Neg Hx     Social History   Socioeconomic History  . Marital status: Married    Spouse name: Not on file  . Number of children: 1  . Years of education: Not on file  . Highest education level: Not on file  Occupational History  . Occupation: Lobbyist: [employer]  Tobacco Use  . Smoking status: Former Smoker    Quit date: 05/20/1990    Years since quitting: 29.3  . Smokeless tobacco: Never Used  . Tobacco comment: previous 30 pack year history  Substance and Sexual Activity  . Alcohol use: Yes    Comment: rare  . Drug use: Never  . Sexual activity: Not on file  Other Topics Concern  . Not on file  Social History Narrative  . Not on file   Social Determinants of Health   Financial Resource Strain:   . Difficulty of Paying Living Expenses:   Food Insecurity:   . Worried About Charity fundraiser in the Last Year:   . Arboriculturist in the Last Year:   Transportation Needs:   . Film/video editor (Medical):   Marland Kitchen Lack of Transportation (Non-Medical):   Physical Activity:   . Days of Exercise per Week:   . Minutes of Exercise per Session:   Stress:   . Feeling of Stress :   Social Connections:   . Frequency of Communication with Friends and Family:   . Frequency of Social Gatherings with Friends and Family:   . Attends Religious Services:   . Active Member of Clubs or Organizations:   . Attends Archivist Meetings:   Marland Kitchen Marital Status:   Intimate Partner Violence:   . Fear of Current or Ex-Partner:   . Emotionally Abused:   Marland Kitchen Physically Abused:   . Sexually Abused:      Physical Exam: BP 118/66   Pulse 74   Temp 98.9 F (37.2 C)   Ht 5\' 7"  (1.702 m)   Wt 198 lb (89.8 kg)   BMI 31.01 kg/m  Constitutional: generally well-appearing Psychiatric: alert and  oriented x3 Abdomen: soft, nontender, nondistended, no obvious ascites, no peritoneal signs, normal bowel sounds No obvious hernia in right groin, no bulging or masses. No peripheral edema noted in lower extremities  Assessment and plan: 74 y.o. male with acute diarrhea, also right groin pain  The seem to be 2 separate problems.  I think his acute diarrhea may be related to the antibiotics he took 2 to 4 weeks prior to the acute diarrhea start.  I am very suspicious of C. difficile infection.  He will get a GI pathogen panel and I am also calling him in vancomycin 125 mg 4 times daily x2 weeks.  He will not pick up the prescription until he hears back from [place] about his GI pathogen panel results.  Given the time course I think even if his GI pathogen panel is negative I would possibly recommend that he go ahead and start the antibiotics.  There is a very high pretest probability here.  Not  clear what is causing the right groin pain but we agreed to evaluate it after we get his acute diarrhea under control.    Please see the "Patient Instructions" section for addition details about the plan.  Owens Loffler, MD [facility] 09/29/2019, 8:37 AM   Total time on date of encounter was 30 minutes (this included time spent preparing to see the patient reviewing records; obtaining and/or reviewing separately obtained history; performing a medically appropriate exam and/or evaluation; counseling and educating the patient and family if present; ordering medications, tests or procedures if applicable; and documenting clinical information in the health record).

## 2019-09-29 NOTE — Patient Instructions (Addendum)
If you are age 76 or older, your body mass index should be between 23-30. Your Body mass index is 31.01 kg/m. If this is out of the aforementioned range listed, please consider follow up with your Primary Care Provider.  If you are age 40 or younger, your body mass index should be between 19-25. Your Body mass index is 31.01 kg/m. If this is out of the aformentioned range listed, please consider follow up with your Primary Care Provider.   Your provider has requested that you go to the basement level for lab work before leaving today. Press "B" on the elevator. The lab is located at the first door on the left as you exit the elevator.  Due to recent changes in healthcare laws, you may see the results of your imaging and laboratory studies on MyChart before your provider has had a chance to review them.  We understand that in some cases there may be results that are confusing or concerning to you. Not all laboratory results come back in the same time frame and the provider may be waiting for multiple results in order to interpret others.  Please give [place] 48 hours in order for your provider to thoroughly review all the results before contacting the office for clarification of your results.   We have sent the following medications to your pharmacy for you to pick up at your convenience:  Vancomycin 125 mg take 1 tablet 4 times daily for 2 weeks.  Please do NOT start vancomycin until after you hear from your lab work.  Thank you for entrusting me with your care and choosing [facility].  Dr Ardis Hughs   Please do not start

## 2019-10-02 LAB — GI PROFILE, STOOL, PCR
Adenovirus F 40/41: NOT DETECTED
Astrovirus: NOT DETECTED
C difficile toxin A/B: NOT DETECTED
Campylobacter: NOT DETECTED
Cryptosporidium: NOT DETECTED
Cyclospora cayetanensis: NOT DETECTED
E coli O157: NOT DETECTED
Entamoeba histolytica: NOT DETECTED
Enteroaggregative E coli: NOT DETECTED
Enteropathogenic E coli: NOT DETECTED
Enterotoxigenic E coli: NOT DETECTED
Giardia lamblia: NOT DETECTED
Norovirus GI/GII: NOT DETECTED
Plesiomonas shigelloides: NOT DETECTED
Rotavirus A: NOT DETECTED
Salmonella: NOT DETECTED
Sapovirus: NOT DETECTED
Shiga-toxin-producing E coli: NOT DETECTED
Shigella/Enteroinvasive E coli: NOT DETECTED
Vibrio cholerae: NOT DETECTED
Vibrio: NOT DETECTED
Yersinia enterocolitica: NOT DETECTED

## 2019-10-07 DIAGNOSIS — L820 Inflamed seborrheic keratosis: Secondary | ICD-10-CM | POA: Diagnosis not present

## 2019-10-07 DIAGNOSIS — H53001 Unspecified amblyopia, right eye: Secondary | ICD-10-CM | POA: Diagnosis not present

## 2019-10-07 DIAGNOSIS — D485 Neoplasm of uncertain behavior of skin: Secondary | ICD-10-CM | POA: Diagnosis not present

## 2019-10-07 DIAGNOSIS — L209 Atopic dermatitis, unspecified: Secondary | ICD-10-CM | POA: Diagnosis not present

## 2019-10-21 ENCOUNTER — Other Ambulatory Visit: Payer: Self-pay | Admitting: *Deleted

## 2019-10-21 DIAGNOSIS — D23121 Other benign neoplasm of skin of left upper eyelid, including canthus: Secondary | ICD-10-CM | POA: Diagnosis not present

## 2019-10-21 DIAGNOSIS — L821 Other seborrheic keratosis: Secondary | ICD-10-CM | POA: Diagnosis not present

## 2019-10-21 DIAGNOSIS — Z09 Encounter for follow-up examination after completed treatment for conditions other than malignant neoplasm: Secondary | ICD-10-CM | POA: Diagnosis not present

## 2019-10-21 DIAGNOSIS — H02422 Myogenic ptosis of left eyelid: Secondary | ICD-10-CM | POA: Diagnosis not present

## 2019-10-21 DIAGNOSIS — H02421 Myogenic ptosis of right eyelid: Secondary | ICD-10-CM | POA: Diagnosis not present

## 2019-10-21 MED ORDER — TRAMADOL HCL 50 MG PO TABS: ORAL_TABLET | ORAL | 0 refills | Status: DC

## 2019-10-21 NOTE — Telephone Encounter (Signed)
Refill request received via fax  Last Visit:09/01/2019 Next Visit:02/03/2020 UDS: 06/22/2019 Narc Agreement: 06/22/19  Last Fill 09/24/2019  Okay to refill Tramadol?

## 2019-10-25 ENCOUNTER — Other Ambulatory Visit: Payer: Self-pay | Admitting: Cardiology

## 2019-10-26 NOTE — Telephone Encounter (Signed)
Pt last saw Dr Curt Bears 07/13/19, last labs 06/22/19 Creat 0.93, age 74, weight 89.8kg, based on specified criteria pt is on appropriate dosage of Eliquis 5mg  BID.  Will refill rx.

## 2019-10-29 ENCOUNTER — Encounter: Payer: Self-pay | Admitting: Gastroenterology

## 2019-11-04 ENCOUNTER — Ambulatory Visit (INDEPENDENT_AMBULATORY_CARE_PROVIDER_SITE_OTHER): Payer: Medicare Other | Admitting: *Deleted

## 2019-11-04 DIAGNOSIS — I420 Dilated cardiomyopathy: Secondary | ICD-10-CM

## 2019-11-05 ENCOUNTER — Ambulatory Visit (INDEPENDENT_AMBULATORY_CARE_PROVIDER_SITE_OTHER): Payer: Medicare Other

## 2019-11-05 DIAGNOSIS — I5022 Chronic systolic (congestive) heart failure: Secondary | ICD-10-CM | POA: Diagnosis not present

## 2019-11-05 DIAGNOSIS — Z95810 Presence of automatic (implantable) cardiac defibrillator: Secondary | ICD-10-CM

## 2019-11-05 NOTE — Progress Notes (Addendum)
EPIC Encounter for ICM Monitoring  Patient Name: [name] is a 74 y.o. male Date: 11/05/2019 Primary Care Physican: Biagio Borg, MD Primary Cardiologist:Cooper Electrophysiologist:Camnitz Bi-V Pacing:98.4% 3/3/2021Office Weight:190lbs   Time in AT/AF 0.0 hr/day (0.0%)   Spoke with patient and reports feeling well at this time.  Denies fluid symptoms.    Optivol thoracic impedancenormal.  Takes Spirolactone 25 mg take 0.5 tablet daily.  Recommendations:No changes and encouraged to call if experiencing any fluid symptoms.  Follow-up plan: ICM clinic phone appointment on7/19/2021. 91 day device clinic remote transmission9/16/2021.    Copy of ICM check sent to [facility]   3 month ICM trend: 11/05/2019    1 Year ICM trend:       [name], RN 11/05/2019 5:00 PM

## 2019-11-06 LAB — CUP PACEART REMOTE DEVICE CHECK
Battery Remaining Longevity: 57 mo
Battery Voltage: 2.97 V
Brady Statistic AP VP Percent: 2.74 %
Brady Statistic AP VS Percent: 0.04 %
Brady Statistic AS VP Percent: 92.61 %
Brady Statistic AS VS Percent: 4.61 %
Brady Statistic RA Percent Paced: 2.74 %
Brady Statistic RV Percent Paced: 32.43 %
Date Time Interrogation Session: 20210618151120
HighPow Impedance: 65 Ohm
Implantable Lead Implant Date: 20181108
Implantable Lead Implant Date: 20181108
Implantable Lead Implant Date: 20181108
Implantable Lead Location: 753858
Implantable Lead Location: 753859
Implantable Lead Location: 753860
Implantable Lead Model: 4298
Implantable Lead Model: 5076
Implantable Pulse Generator Implant Date: 20181108
Lead Channel Impedance Value: 1140 Ohm
Lead Channel Impedance Value: 1140 Ohm
Lead Channel Impedance Value: 1197 Ohm
Lead Channel Impedance Value: 204.14 Ohm
Lead Channel Impedance Value: 216.848
Lead Channel Impedance Value: 268.078
Lead Channel Impedance Value: 276.523
Lead Channel Impedance Value: 300.368
Lead Channel Impedance Value: 399 Ohm
Lead Channel Impedance Value: 399 Ohm
Lead Channel Impedance Value: 418 Ohm
Lead Channel Impedance Value: 475 Ohm
Lead Channel Impedance Value: 589 Ohm
Lead Channel Impedance Value: 589 Ohm
Lead Channel Impedance Value: 608 Ohm
Lead Channel Impedance Value: 703 Ohm
Lead Channel Impedance Value: 760 Ohm
Lead Channel Impedance Value: 817 Ohm
Lead Channel Pacing Threshold Amplitude: 0.5 V
Lead Channel Pacing Threshold Amplitude: 0.625 V
Lead Channel Pacing Threshold Amplitude: 2.375 V
Lead Channel Pacing Threshold Pulse Width: 0.4 ms
Lead Channel Pacing Threshold Pulse Width: 0.4 ms
Lead Channel Pacing Threshold Pulse Width: 1 ms
Lead Channel Sensing Intrinsic Amplitude: 11.5 mV
Lead Channel Sensing Intrinsic Amplitude: 11.5 mV
Lead Channel Sensing Intrinsic Amplitude: 2.875 mV
Lead Channel Sensing Intrinsic Amplitude: 2.875 mV
Lead Channel Setting Pacing Amplitude: 2 V
Lead Channel Setting Pacing Amplitude: 2.5 V
Lead Channel Setting Pacing Amplitude: 2.75 V
Lead Channel Setting Pacing Pulse Width: 0.4 ms
Lead Channel Setting Pacing Pulse Width: 1 ms
Lead Channel Setting Sensing Sensitivity: 0.3 mV

## 2019-11-08 NOTE — Progress Notes (Signed)
Remote ICD transmission.   

## 2019-11-17 ENCOUNTER — Telehealth: Payer: Self-pay | Admitting: *Deleted

## 2019-11-17 ENCOUNTER — Telehealth: Payer: Self-pay | Admitting: Cardiology

## 2019-11-17 NOTE — Telephone Encounter (Signed)
    Pt returning call from [place], transferred call to device clinic

## 2019-11-17 NOTE — Telephone Encounter (Signed)
Pt returned call. Reports he sent manual transmission as he could tell he was in AF. Felt palpitations and at one point felt like he was going to faint but did not. [place] the beach fishing and may have gotten dehydrated. Drank 2 bottles of water and rested, then felt better. Reports AF episode lasted ~24hrs. He agrees to send a manual transmission this evening to confirm that he is back in SR. Pt reports compliance with carvedilol and Eliquis. Pt reports he is not ready to try an AAD, but is willing to consider it if he has recurrent episodes of AF. Due for f/u in 12/2019 with Dr. Curt Bears. Pt to call back to schedule as he is currently out on his boat. Pt agrees to call for future symptomatic AF episodes and denies additional questions at this time.

## 2019-11-17 NOTE — Telephone Encounter (Signed)
ICD transmission received 11/16/19 at 12:20 shows ongoing AF episode since 11/16/19 at 17:47. V rates elevated during AF, presenting V rate 110s-150s with intermittent BiVP. Known PAF, on Eliquis. Pt previously declined AAD per Dr. Macky Lower OV note from 07/13/19.  LMOVM requesting call back to DC. Direct number provided.

## 2019-11-18 ENCOUNTER — Other Ambulatory Visit: Payer: Self-pay

## 2019-11-18 ENCOUNTER — Telehealth: Payer: Self-pay | Admitting: Cardiology

## 2019-11-18 MED ORDER — ENTRESTO 49-51 MG PO TABS: 1.0000 | ORAL_TABLET | Freq: Two times a day (BID) | ORAL | 2 refills | Status: DC

## 2019-11-18 NOTE — Telephone Encounter (Signed)
If further episodes of AF occur, [name] likely need antiarrhythmics.  Can follow-up with me or in A. fib clinic.

## 2019-11-18 NOTE — Telephone Encounter (Signed)
Spoke with pt, advised we did receive a remote transmission last nighht, no new episodes of AF.  Transferred pt to scheduling to make f/u appt with Dr. Curt Bears

## 2019-11-18 NOTE — Telephone Encounter (Signed)
Patient states he is returning Emily's call.

## 2019-11-19 NOTE — Telephone Encounter (Signed)
Spoke with pt to advise of Dr. Macky Lower recommendations. Pt is scheduled to see Dr. Curt Bears on 01/11/20. Advised that per transmission from 11/17/19 at 21:05, he was back in AS/LVP rhythm. Pt aware to call back with any questions or concerns prior to OV w/ Dr. Curt Bears.

## 2019-11-23 ENCOUNTER — Other Ambulatory Visit: Payer: Self-pay | Admitting: *Deleted

## 2019-11-23 MED ORDER — TRAMADOL HCL 50 MG PO TABS: ORAL_TABLET | ORAL | 0 refills | Status: DC

## 2019-11-23 NOTE — Telephone Encounter (Signed)
Refill request received via fax  Last Visit:09/01/2019 Next Visit:02/03/2020 UDS: 06/22/2019 Narc Agreement:06/22/19  Last Fill 10/21/2019  Okay to refill Tramadol?

## 2019-11-24 ENCOUNTER — Other Ambulatory Visit: Payer: Self-pay | Admitting: *Deleted

## 2019-11-24 DIAGNOSIS — Z5181 Encounter for therapeutic drug level monitoring: Secondary | ICD-10-CM | POA: Diagnosis not present

## 2019-11-24 DIAGNOSIS — Z79899 Other long term (current) drug therapy: Secondary | ICD-10-CM

## 2019-11-25 NOTE — Progress Notes (Signed)
Total bilirubin is mildly elevated.  Rest of CMP WNL.  Please forward to PCP.   CBC WNL.

## 2019-11-26 ENCOUNTER — Telehealth: Payer: Self-pay

## 2019-11-26 LAB — CBC WITH DIFFERENTIAL/PLATELET
Absolute Monocytes: 604 cells/uL (ref 200–950)
Basophils Absolute: 43 cells/uL (ref 0–200)
Basophils Relative: 0.7 %
Eosinophils Absolute: 122 cells/uL (ref 15–500)
Eosinophils Relative: 2 %
HCT: 46.8 % (ref 38.5–50.0)
Hemoglobin: 15.3 g/dL (ref 13.2–17.1)
Lymphs Abs: 1251 cells/uL (ref 850–3900)
MCH: 29.6 pg (ref 27.0–33.0)
MCHC: 32.7 g/dL (ref 32.0–36.0)
MCV: 90.5 fL (ref 80.0–100.0)
MPV: 9.8 fL (ref 7.5–12.5)
Monocytes Relative: 9.9 %
Neutro Abs: 4081 cells/uL (ref 1500–7800)
Neutrophils Relative %: 66.9 %
Platelets: 253 10*3/uL (ref 140–400)
RBC: 5.17 10*6/uL (ref 4.20–5.80)
RDW: 12.8 % (ref 11.0–15.0)
Total Lymphocyte: 20.5 %
WBC: 6.1 10*3/uL (ref 3.8–10.8)

## 2019-11-26 LAB — DRUG MONITOR, PANEL 5, W/CONF, URINE
Amphetamines: NEGATIVE ng/mL (ref <500)
Barbiturates: NEGATIVE ng/mL (ref <300)
Benzodiazepines: NEGATIVE ng/mL (ref <100)
Cocaine Metabolite: NEGATIVE ng/mL (ref <150)
Creatinine: 239 mg/dL
Marijuana Metabolite: NEGATIVE ng/mL (ref <20)
Methadone Metabolite: NEGATIVE ng/mL (ref <100)
Opiates: NEGATIVE ng/mL (ref <100)
Oxidant: NEGATIVE ug/mL
Oxycodone: NEGATIVE ng/mL (ref <100)
pH: 6.8 (ref 4.5–9.0)

## 2019-11-26 LAB — COMPLETE METABOLIC PANEL WITH GFR
AG Ratio: 2 (calc) (ref 1.0–2.5)
ALT: 18 U/L (ref 9–46)
AST: 20 U/L (ref 10–35)
Albumin: 4.3 g/dL (ref 3.6–5.1)
Alkaline phosphatase (APISO): 62 U/L (ref 35–144)
BUN: 14 mg/dL (ref 7–25)
CO2: 26 mmol/L (ref 20–32)
Calcium: 9.5 mg/dL (ref 8.6–10.3)
Chloride: 104 mmol/L (ref 98–110)
Creat: 0.9 mg/dL (ref 0.70–1.18)
GFR, Est African American: 96 mL/min/{1.73_m2} (ref >=60)
GFR, Est Non African American: 83 mL/min/{1.73_m2} (ref >=60)
Globulin: 2.2 g/dL (calc) (ref 1.9–3.7)
Glucose, Bld: 100 mg/dL — ABNORMAL HIGH (ref 65–99)
Potassium: 4.1 mmol/L (ref 3.5–5.3)
Sodium: 138 mmol/L (ref 135–146)
Total Bilirubin: 1.4 mg/dL — ABNORMAL HIGH (ref 0.2–1.2)
Total Protein: 6.5 g/dL (ref 6.1–8.1)

## 2019-11-26 LAB — DRUG MONITOR, TRAMADOL,QN, URINE
Desmethyltramadol: 2049 ng/mL — ABNORMAL HIGH (ref <100)
Tramadol: 1204 ng/mL — ABNORMAL HIGH (ref <100)

## 2019-11-26 LAB — DM TEMPLATE

## 2019-11-26 NOTE — Telephone Encounter (Signed)
Patient called with questions regarding tramadol urine drug screen results. Patient was questioning the lab values and the results being flagged. I advised patient it was consistent with treatment and he verbalized understanding.

## 2019-11-26 NOTE — Progress Notes (Signed)
UDS is consistent with treatment.

## 2019-12-06 ENCOUNTER — Ambulatory Visit (INDEPENDENT_AMBULATORY_CARE_PROVIDER_SITE_OTHER): Payer: Medicare Other

## 2019-12-06 DIAGNOSIS — Z95810 Presence of automatic (implantable) cardiac defibrillator: Secondary | ICD-10-CM

## 2019-12-06 DIAGNOSIS — I5022 Chronic systolic (congestive) heart failure: Secondary | ICD-10-CM | POA: Diagnosis not present

## 2019-12-08 NOTE — Progress Notes (Signed)
EPIC Encounter for ICM Monitoring  Patient Name: ASHLEY MONTMINY is a 74 y.o. male Date: 12/08/2019 Primary Care Physican: Biagio Borg, MD Primary Cardiologist:Cooper Electrophysiologist:Camnitz Bi-V Pacing:98.4% 5/12/2021Office Weight:198lbs  Time in AT/AF 0.0 hr/day (0.0%)   Spoke with patient and reports feeling well at this time.  Denies fluid symptoms.    Optivol thoracic impedancenormal.  Takes Spirolactone 25 mg take 0.5 tablet daily.  Recommendations:No changes and encouraged to call if experiencing any fluid symptoms.  Follow-up plan: ICM clinic phone appointment on 01/25/2020.   91 day device clinic remote transmission 02/03/2020.    EP/Cardiology Office Visits: 01/11/2020 with Dr. Curt Bears.    Copy of ICM check sent to Dr. Curt Bears.   3 month ICM trend: 12/06/2019    1 Year ICM trend:       [name], RN 12/08/2019 1:34 PM

## 2019-12-15 ENCOUNTER — Other Ambulatory Visit: Payer: Self-pay | Admitting: Rheumatology

## 2019-12-16 NOTE — Telephone Encounter (Signed)
Last Visit:09/01/2019 Next Visit:02/03/2020 UDS: 11/24/2019 Narc Agreement:11/24/2019  Last Fill: 11/23/2019  Okay to refill Tramadol?

## 2019-12-19 HISTORY — PX: COLONOSCOPY: SHX174

## 2019-12-20 LAB — CUP PACEART INCLINIC DEVICE CHECK
Brady Statistic AP VP Percent: 1.8 %
Brady Statistic AP VS Percent: 0.1 % — CL
Brady Statistic AS VP Percent: 93.7 %
Brady Statistic AS VS Percent: 4.5 %
Date Time Interrogation Session: 20210223170830
Implantable Lead Implant Date: 20181108
Implantable Lead Implant Date: 20181108
Implantable Lead Implant Date: 20181108
Implantable Lead Location: 753858
Implantable Lead Location: 753859
Implantable Lead Location: 753860
Implantable Lead Model: 4298
Implantable Lead Model: 5076
Implantable Pulse Generator Implant Date: 20181108
Lead Channel Pacing Threshold Amplitude: 0.5 V
Lead Channel Pacing Threshold Amplitude: 0.5 V
Lead Channel Pacing Threshold Amplitude: 2.25 V
Lead Channel Pacing Threshold Pulse Width: 0.4 ms
Lead Channel Pacing Threshold Pulse Width: 0.4 ms
Lead Channel Pacing Threshold Pulse Width: 1 ms
Lead Channel Sensing Intrinsic Amplitude: 13.5 mV
Lead Channel Sensing Intrinsic Amplitude: 3 mV

## 2019-12-22 ENCOUNTER — Other Ambulatory Visit: Payer: Self-pay | Admitting: Physician Assistant

## 2019-12-22 ENCOUNTER — Telehealth: Payer: Self-pay

## 2019-12-22 NOTE — Telephone Encounter (Signed)
[facility] Pre-operative Risk Assessment     Request for surgical clearance:     Endoscopy Procedure  What type of surgery is being performed?     Colonoscopy  When is this surgery scheduled?     8/27  What type of clearance is required ?   Pharmacy  Are there any medications that need to be held prior to surgery and how long? Eliquis  Practice name and name of physician performing surgery?      [facility]  What is your office phone and fax number?      Phone- [phone]  [phone]  Anesthesia type (None, local, MAC, general) ?       MAC

## 2019-12-22 NOTE — Telephone Encounter (Signed)
[name], This patient was seen for an OV on 09/2019 and is currently on Eliquis.  A hold was not requested for this patient. Please obtain a hold for the Eliquis as this patient is scheduled for a pre visit on 12/23/2019. Thank you

## 2019-12-22 NOTE — Telephone Encounter (Signed)
Patient with diagnosis of afib on Eliquis for anticoagulation.    Procedure: Colonoscopy Date of procedure: 01/14/20  CHADS2-VASc score of  5 (CHF, HTN, AGE, CAD, AGE)  CrCl 75 ml/min  Per office protocol, patient can hold Eliquis for 1- 2 days prior to procedure.

## 2019-12-22 NOTE — Telephone Encounter (Signed)
Pharm please address eliquis thanks 

## 2019-12-31 ENCOUNTER — Telehealth: Payer: Self-pay | Admitting: *Deleted

## 2019-12-31 ENCOUNTER — Ambulatory Visit (AMBULATORY_SURGERY_CENTER): Payer: Self-pay | Admitting: *Deleted

## 2019-12-31 ENCOUNTER — Other Ambulatory Visit: Payer: Self-pay

## 2019-12-31 VITALS — Ht 67.0 in | Wt 191.0 lb

## 2019-12-31 DIAGNOSIS — Z86010 Personal history of colonic polyps: Secondary | ICD-10-CM

## 2019-12-31 DIAGNOSIS — R197 Diarrhea, unspecified: Secondary | ICD-10-CM

## 2019-12-31 NOTE — Telephone Encounter (Signed)
Patient in for pre-visit, acknowledged that you had recommend against colonoscopy at this time, patient still having issues with diarrhea and also just wants to get this done before he ages out.  Also has elevated bilirubin and wanted you aware. I told him I would forward this info to you and would let him know if you still advise against colonoscopy.

## 2019-12-31 NOTE — Progress Notes (Signed)
No egg or soy allergy known to patient  No issues with past sedation with any surgeries or procedures no intubation problems in the past  No FH of Malignant Hyperthermia No diet pills per patient No home 02 use per patient  No blood thinners per patient  Pt denies issues with constipation  No A fib or A flutter  EMMI video to pt or via MyChart  COVID 19 guidelines implemented in PV today with Pt and RN   Coupon given to pt in PV today , Code to Pharmacy   Due to the COVID-19 pandemic we are asking patients to follow these guidelines. Please only bring one care partner. Please be aware that your care partner may wait in the car in the parking lot or if they feel like they will be too hot to wait in the car, they may wait in the lobby on the 4th floor. All care partners are required to wear a mask the entire time (we do not have any that we can provide them), they need to practice social distancing, and we will do a Covid check for all patient's and care partners when you arrive. Also we will check their temperature and your temperature. If the care partner waits in their car they need to stay in the parking lot the entire time and we will call them on their cell phone when the patient is ready for discharge so they can bring the car to the front of the building. Also all patient's will need to wear a mask into building. 

## 2020-01-04 NOTE — Telephone Encounter (Signed)
I am not sure at all what he is talking about.  See patient correspondence from mid May where I offered him a colonoscopy or scheduled Imodium on a daily basis for several weeks.  He chose to do a colonoscopy and I think that is completely reasonable.   As far as his bilirubin elevation it was also elevated about 1 year ago and then normal in the interim.  Unlikely anything serious.

## 2020-01-05 ENCOUNTER — Ambulatory Visit: Payer: Medicare Other | Admitting: Internal Medicine

## 2020-01-11 ENCOUNTER — Other Ambulatory Visit: Payer: Self-pay

## 2020-01-11 ENCOUNTER — Encounter: Payer: Self-pay | Admitting: Cardiology

## 2020-01-11 ENCOUNTER — Ambulatory Visit (INDEPENDENT_AMBULATORY_CARE_PROVIDER_SITE_OTHER): Payer: Medicare Other | Admitting: Cardiology

## 2020-01-11 VITALS — BP 132/78 | HR 85 | Ht 67.0 in | Wt 188.4 lb

## 2020-01-11 DIAGNOSIS — I420 Dilated cardiomyopathy: Secondary | ICD-10-CM

## 2020-01-11 DIAGNOSIS — I255 Ischemic cardiomyopathy: Secondary | ICD-10-CM | POA: Diagnosis not present

## 2020-01-11 DIAGNOSIS — I5022 Chronic systolic (congestive) heart failure: Secondary | ICD-10-CM

## 2020-01-11 NOTE — Patient Instructions (Signed)
Medication Instructions:  Your physician recommends that you continue on your current medications as directed. Please refer to the Current Medication list given to you today.  *If you need a refill on your cardiac medications before your next appointment, please call your pharmacy*   Lab Work: None ordered   Testing/Procedures: None ordered   Follow-Up: At [facility], you and your health needs are our priority.  As part of our continuing mission to provide you with exceptional heart care, we have created designated Provider Care Teams.  These Care Teams include your primary Cardiologist (physician) and Advanced Practice Providers (APPs -  Physician Assistants and Nurse Practitioners) who all work together to provide you with the care you need, when you need it.  We recommend signing up for the patient portal called "MyChart".  Sign up information is provided on this After Visit Summary.  MyChart is used to connect with patients for Virtual Visits (Telemedicine).  Patients are able to view lab/test results, encounter notes, upcoming appointments, etc.  Non-urgent messages can be sent to your provider as well.   To learn more about what you can do with MyChart, go to [email or web address].    Your next appointment:   6 month(s)  The format for your next appointment:   In Person  Provider:   Will Camnitz, MD   Thank you for choosing [facility]!!   Harnoor Reta, RN [phone]    Other Instructions    

## 2020-01-11 NOTE — Progress Notes (Signed)
Electrophysiology Office Note   Date:  01/11/2020   ID:  [place], Hoback 1946-02-02, MRN [id number]  PCP:  Biagio Borg, MD  Cardiologist:  Burt Knack Primary Electrophysiologist:  Dr Curt Bears    CC: Follow up for device check/new onset afib.   History of Present Illness: [name] is a 74 y.o. male who is being seen today for the evaluation of CHF at the request of Biagio Borg, MD. Presenting today for electrophysiology evaluation. He has a history of chronic systolic heart failure, coronary artery disease status post stenting of the RCA, left bundle branch block. He also has sleep apnea and uses a CPAP at night.  Medtronic CRT-D implanted 03/27/17.  Implant was comp gated by microperforation.  He was put on colchicine and that greatly improved his symptoms.  An episode of paroxysmal atrial fibrillation noted on remote transmission.  This episode lasted up to 14 hours and he was placed on Eliquis.  Today, denies symptoms of palpitations, chest pain, shortness of breath, orthopnea, PND, lower extremity edema, claudication, dizziness, presyncope, syncope, bleeding, or neurologic sequela. The patient is tolerating medications without difficulties.  He has been doing well.  He has no chest pain or shortness of breath.  He did have one episode of atrial fibrillation when he was quite symptomatic.  He [place] the [place] and had atrial fibrillation for a few hours after that.  He had symptoms of shortness of breath and fatigue.  He feels that he was dehydrated as well.  He has had other episodes of atrial fibrillation but was less symptomatic from those.  Past Medical History:  Diagnosis Date   AICD (automatic cardioverter/defibrillator) present    Allergic rhinitis    Allergy    Anginal pain ([place]) 05/26/14   chest pain after chasing dog   Atypical nevus of back 04/27/2003   moderate - mid lower back   Basal cell carcinoma 01/26/2008   right cheek - MOHs   CAD  (coronary artery disease)    hx of stent- 2005 RCA   Cataract    beginning stage both eyes   CHF (congestive heart failure) (HCC)    pacemaker Medtronic     Chronic back pain    intermittent   Constipation    Dizziness    Dysrhythmia    right bundle branch block    Esophageal stricture    GERD (gastroesophageal reflux disease)    Hemoptysis    Hiatal hernia    History of colonic polyps    hyperplastic   HTN (hypertension)    Hyperlipidemia    Hypertrophy of prostate with urinary obstruction and other lower urinary tract symptoms (LUTS)    OA (osteoarthritis)    OSA (obstructive sleep apnea)    cpap- 10    Other specified disorder of stomach and duodenum    duodenal periampulary tubulovillous adenoma removed by Dr. Ardis Hughs 5/10   Pericarditis 07/06/2019   Pneumonia    Pre-diabetes    no medications   Shortness of breath    with exertion   Sleep apnea    cpap   Testicular hypofunction    Past Surgical History:  Procedure Laterality Date   BIV ICD INSERTION CRT-D N/A 03/27/2017   Procedure: BIV ICD INSERTION CRT-D;  Surgeon: Constance Haw, MD;  Location: [facility];  Service: Cardiovascular;  Laterality: N/A;   CARDIAC CATHETERIZATION     '05, last 2009, showing patent RCA stent  COLONOSCOPY  12/2007   HYPERPLASTIC POLYP   COLONOSCOPY WITH PROPOFOL N/A 06/02/2014   Procedure: COLONOSCOPY WITH PROPOFOL;  Surgeon: [name], MD;  Location: [facility];  Service: Endoscopy;  Laterality: N/A;   CORONARY ANGIOPLASTY  08/2003   ESOPHAGOGASTRODUODENOSCOPY (EGD) WITH PROPOFOL N/A 06/02/2014   Procedure: ESOPHAGOGASTRODUODENOSCOPY (EGD) WITH PROPOFOL;  Surgeon: [name], MD;  Location: [facility];  Service: Endoscopy;  Laterality: N/A;   hernia surgery x 3     Bilateral Inguinal, Umbicial   INSERTION OF MESH N/A 03/20/2018   Procedure: INSERTION OF MESH;  Surgeon: Alphonsa Overall, MD;  Location: [facility];  Service: General;   Laterality: N/A;   IRRIGATION AND DEBRIDEMENT ABSCESS Left 08/14/2012   Procedure: IRRIGATION AND DEBRIDEMENT LEFT INGUINAL BOIL ;  Surgeon: Ailene Rud, MD;  Location: [facility];  Service: Urology;  Laterality: Left;   JOINT REPLACEMENT Right 2017   KNEE ARTHROPLASTY     LEFT HEART CATH AND CORONARY ANGIOGRAPHY N/A 01/24/2019   Procedure: LEFT HEART CATH AND CORONARY ANGIOGRAPHY;  Surgeon: Martinique, Peter M, MD;  Location: [facility];  Service: Cardiovascular;  Laterality: N/A;   LEFT HEART CATHETERIZATION WITH CORONARY ANGIOGRAM N/A 05/26/2014   Procedure: LEFT HEART CATHETERIZATION WITH CORONARY ANGIOGRAM;  Surgeon: Blane Ohara, MD;  Location: [facility];  Service: Cardiovascular;  Laterality: N/A;   LUMBAR LAMINECTOMY/DECOMPRESSION MICRODISCECTOMY Left 09/26/2014   Procedure: Lumbar Laminectomy for resection of synovial cyst  Lumbar five- sacral one left;  Surgeon: Kary Kos, MD;  Location: [facility];  Service: Neurosurgery;  Laterality: Left;   MOLE REMOVAL Left 03/20/2018   Procedure: MOLE REMOVAL;  Surgeon: Alphonsa Overall, MD;  Location: [facility];  Service: General;  Laterality: Left;   Oral surg to removed growth from ?sinus  10/2010   ?Dermoid removed by DrRiggs   PACEMAKER INSERTION  04/03/2017   POLYPECTOMY     RCA stenting     '05 RCA   right toe surgery  Right    Cyst    RIGHT/LEFT HEART CATH AND CORONARY ANGIOGRAPHY N/A 01/15/2017   Procedure: RIGHT/LEFT HEART CATH AND CORONARY ANGIOGRAPHY;  Surgeon: [name], MD;  Location: [facility];  Service: Cardiovascular;  Laterality: N/A;   s/p right knee arthroscopy  2005   thumb surgery Right    TOTAL KNEE ARTHROPLASTY Right 03/12/2016   Procedure: RIGHT TOTAL KNEE ARTHROPLASTY;  Surgeon: Paralee Cancel, MD;  Location: [facility];  Service: Orthopedics;  Laterality: Right;   UPPER GASTROINTESTINAL ENDOSCOPY     VENTRAL HERNIA REPAIR N/A 03/20/2018   Procedure: [place];  Surgeon: Alphonsa Overall, MD;  Location: [facility];  Service: General;  Laterality: N/A;     Current Outpatient Medications  Medication Sig Dispense Refill   acetaminophen (TYLENOL) 500 MG tablet Take 500 mg by mouth 2 (two) times daily.     atorvastatin (LIPITOR) 20 MG tablet Take 1 tablet (20 mg total) by mouth daily. 90 tablet 3   b complex vitamins tablet Take 1 tablet by mouth daily with lunch.      calcium carbonate (OSCAL) 1500 (600 Ca) MG TABS tablet Take 600 mg by mouth every evening.     carvedilol (COREG) 25 MG tablet TAKE 1 TABLET(25 MG) BY MOUTH TWICE DAILY WITH A MEAL 180 tablet 3   CINNAMON PO Take 1 tablet by mouth daily with lunch.      Coenzyme Q10 (CO Q 10) 100 MG CAPS  Take 1 capsule by mouth daily with lunch.      colchicine 0.6 MG tablet Take 1 tablet (0.6 mg total) by mouth daily. 30 tablet 0   diclofenac sodium (VOLTAREN) 1 % GEL Apply 1 application topically 4 (four) times daily as needed (pain).   3   ELIQUIS 5 MG TABS tablet TAKE 1 TABLET BY MOUTH 2 TIMES DAILY 962 tablet 1   folic acid (FOLVITE) 229 MCG tablet Take 400 mcg by mouth daily.      Glucosamine HCl (GLUCOSAMINE PO) Take 1,500 mg by mouth every evening.     hydroCHLOROthiazide (MICROZIDE PO) hydrochlorothiazide     hydrocortisone 2.5 % cream Apply 1 application topically 2 (two) times daily as needed (rash on nose).      hydroxychloroquine (PLAQUENIL) 200 MG tablet TAKE 1 TABLET BY MOUTH TWICE DAILY MONDAY THROUGH FRIDAY ONLY. DO NOT TAKE SATURDAY OR SUNDAY 120 tablet 0   hydroxypropyl methylcellulose / hypromellose (ISOPTO TEARS / GONIOVISC) 2.5 % ophthalmic solution Place 1 drop into both eyes 4 (four) times daily as needed for dry eyes.      Multiple Vitamin (MULTIVITAMIN) capsule Take 1 capsule by mouth daily.       Multiple Vitamins-Minerals (OCUVITE PO) Take 1 tablet by mouth at bedtime.     nitroGLYCERIN (NITROSTAT) 0.4 MG SL tablet Place 1 tablet (0.4 mg total)  under the tongue every 5 (five) minutes x 3 doses as needed for chest pain. 25 tablet 0   NON FORMULARY CPAP machine with sleep.     pantoprazole (PROTONIX) 40 MG tablet TAKE 1 TABLET BY MOUTH EVERY DAY BEFORE BREAKFAST GENERIC EQUIVALENT FOR PROTONIX 90 tablet 1   Polyethylene Glycol 3350 (MIRALAX PO) Take 238 g by mouth. Colonoscopy bowel prep     Probiotic Product (PROBIOTIC DAILY PO) Take 1 capsule by mouth daily with lunch.      sacubitril-valsartan (ENTRESTO) 49-51 MG Take 1 tablet by mouth 2 (two) times daily. 180 tablet 2   Saw Palmetto, Serenoa repens, (SAW PALMETTO PO) Take 1 capsule by mouth every evening.     spironolactone (ALDACTONE) 25 MG tablet TAKE 1/2 TABLET(12.5 MG) BY MOUTH DAILY 45 tablet 3   tamsulosin (FLOMAX) 0.4 MG CAPS capsule Take 0.4 mg by mouth at bedtime.     Testosterone 20 % CREA Apply 2 mLs topically daily. Rub on shoulder     traMADol (ULTRAM) 50 MG tablet TAKE 1 TABLET(50 MG) BY MOUTH AT BEDTIME AS NEEDED 30 tablet 0   TURMERIC PO Take 1 tablet by mouth daily with lunch.      No current facility-administered medications for this visit.    Allergies:   Sulfonamide derivatives   Social History:  The patient  reports that he quit smoking about 29 years ago. He has never used smokeless tobacco. He reports current alcohol use. He reports that he does not use drugs.   Family History:  The patient's family history includes Arthritis in his brother and daughter; COPD in his brother; Cancer in his brother; Coronary artery disease in his father; Diabetes in his father; Heart disease in his father and mother; Heart failure in his brother; Hypertension in an other family member; Stomach cancer in his paternal grandmother; Sudden death in his father.   ROS:  Please see the history of present illness.   Otherwise, review of systems is positive for none.   All other systems are reviewed and negative.   PHYSICAL EXAM: VS:  BP 132/78  Pulse 85    Ht 5\' 7"   (1.702 m)    Wt 188 lb 6.4 oz (85.5 kg)    SpO2 95%    BMI 29.51 kg/m  , BMI Body mass index is 29.51 kg/m. GEN: Well nourished, well developed, in no acute distress  HEENT: normal  Neck: no JVD, carotid bruits, or masses Cardiac: RRR; no murmurs, rubs, or gallops,no edema  Respiratory:  clear to auscultation bilaterally, normal work of breathing GI: soft, nontender, nondistended, + BS MS: no deformity or atrophy  Skin: warm and dry, device site well healed Neuro:  Strength and sensation are intact Psych: euthymic mood, full affect  EKG:  EKG is ordered today. Personal review of the ekg ordered shows atrial sensed, ventricular paced  Personal review of the device interrogation today. Results in [place]: 01/24/2019: B Natriuretic Peptide 40.2; TSH 2.402 11/24/2019: ALT 18; BUN 14; Creat 0.90; Hemoglobin 15.3; Platelets 253; Potassium 4.1; Sodium 138    Lipid Panel     Component Value Date/Time   CHOL 97 01/24/2019 0725   CHOL 138 11/27/2016 0914   TRIG 161 (H) 01/24/2019 0725   HDL 27 (L) 01/24/2019 0725   HDL 35 (L) 11/27/2016 0914   CHOLHDL 3.6 01/24/2019 0725   VLDL 32 01/24/2019 0725   LDLCALC 38 01/24/2019 0725   LDLCALC 65 11/27/2016 0914     Wt Readings from Last 3 Encounters:  01/11/20 188 lb 6.4 oz (85.5 kg)  12/31/19 191 lb (86.6 kg)  09/29/19 198 lb (89.8 kg)      Other studies Reviewed: Additional studies/ records that were reviewed today include: TTE 06/25/16 Review of the above records today demonstrates:  - Left ventricle: The cavity size was normal. Systolic function was   normal. The estimated ejection fraction was in the range of 55%   to 60%. Wall motion was normal; there were no regional wall   motion abnormalities. Doppler parameters are consistent with   abnormal left ventricular relaxation (grade 1 diastolic   dysfunction). - Ventricular septum: Septal motion showed dyssynergy. These   changes are consistent with a left bundle  branch block. - Aortic valve: There was trivial regurgitation. - Atrial septum: There was increased thickness of the septum,   consistent with lipomatous hypertrophy.  LHC/RHC 01/05/17 1. Widely patent coronary arteries with continued patency of the stented segment in the right coronary artery 2. Mild nonobstructive coronary artery disease as detailed with mild stenosis of the proximal to mid LAD and otherwise minimal luminal irregularities 3. Well compensated right-sided cardiac hemodynamics  ASSESSMENT AND PLAN:  1.  Chronic systolic heart failure: Status post Medtronic CRT-D implanted 03/27/2017.  Has had normalization of his overall function.  Device functioning appropriately.  No changes.     2.  Paroxysmal atrial fibrillation: Currently on Eliquis with a CHA2DS2-VASc of 4.  His atrial fibrillation burden has gone up mildly.  He would likely be interested in ablation, though would prefer to wait until I see him back in 6 months.  3.  Hyperlipidemia: Continue statin  4.  Hypertension: Currently well controlled  5.  Coronary artery disease: No current chest pain.  Current medicines are reviewed at length with the patient today.   The patient does not have concerns regarding his medicines.  The following changes were made today: None  Labs/ tests ordered today include:  Orders Placed This Encounter  Procedures   EKG 12-Lead     Disposition:   FU with Shelby Anderle  Buffey Zabinski 6 months  Signed, Katrina Daddona [name], MD  01/11/2020 2:32 PM     [address] [place] [address] [phone] (office) [phone] (fax)

## 2020-01-14 ENCOUNTER — Ambulatory Visit (AMBULATORY_SURGERY_CENTER): Payer: Medicare Other | Admitting: Gastroenterology

## 2020-01-14 ENCOUNTER — Other Ambulatory Visit (INDEPENDENT_AMBULATORY_CARE_PROVIDER_SITE_OTHER): Payer: Medicare Other

## 2020-01-14 ENCOUNTER — Telehealth: Payer: Self-pay

## 2020-01-14 ENCOUNTER — Other Ambulatory Visit: Payer: Self-pay | Admitting: Gastroenterology

## 2020-01-14 ENCOUNTER — Encounter: Payer: Self-pay | Admitting: Gastroenterology

## 2020-01-14 ENCOUNTER — Other Ambulatory Visit: Payer: Self-pay

## 2020-01-14 VITALS — BP 110/69 | HR 75 | Temp 97.8°F | Resp 19 | Ht 67.0 in | Wt 190.0 lb

## 2020-01-14 DIAGNOSIS — K529 Noninfective gastroenteritis and colitis, unspecified: Secondary | ICD-10-CM

## 2020-01-14 DIAGNOSIS — R899 Unspecified abnormal finding in specimens from other organs, systems and tissues: Secondary | ICD-10-CM | POA: Diagnosis not present

## 2020-01-14 DIAGNOSIS — R197 Diarrhea, unspecified: Secondary | ICD-10-CM

## 2020-01-14 DIAGNOSIS — G4733 Obstructive sleep apnea (adult) (pediatric): Secondary | ICD-10-CM | POA: Diagnosis not present

## 2020-01-14 DIAGNOSIS — Z86010 Personal history of colonic polyps: Secondary | ICD-10-CM

## 2020-01-14 DIAGNOSIS — I509 Heart failure, unspecified: Secondary | ICD-10-CM | POA: Diagnosis not present

## 2020-01-14 DIAGNOSIS — I10 Essential (primary) hypertension: Secondary | ICD-10-CM | POA: Diagnosis not present

## 2020-01-14 LAB — HEPATIC FUNCTION PANEL
ALT: 26 U/L (ref 0–53)
AST: 25 U/L (ref 0–37)
Albumin: 4.5 g/dL (ref 3.5–5.2)
Alkaline Phosphatase: 56 U/L (ref 39–117)
Bilirubin, Direct: 0.3 mg/dL (ref 0.0–0.3)
Total Bilirubin: 1.9 mg/dL — ABNORMAL HIGH (ref 0.2–1.2)
Total Protein: 7.2 g/dL (ref 6.0–8.3)

## 2020-01-14 MED ORDER — SODIUM CHLORIDE 0.9 % IV SOLN: 500.0000 mL | Freq: Once | INTRAVENOUS | Status: DC

## 2020-01-14 NOTE — Progress Notes (Signed)
Called to room to assist during endoscopic procedure.  Patient ID and intended procedure confirmed with present staff. Received instructions for my participation in the procedure from the performing physician.  

## 2020-01-14 NOTE — Telephone Encounter (Signed)
Spoke to patient to inform him of recent lab results and Dr Ardis Hughs recommendations. All questions answered he voiced understanding. Patient knows to have repeat labs done around 03/13/20.

## 2020-01-14 NOTE — Patient Instructions (Signed)
Discharge instructions given. Biopsies taken. Resume previous medications. Patient to lab after discharge. YOU HAD AN ENDOSCOPIC PROCEDURE TODAY AT [facility]:   Refer to the procedure report that was given to you for any specific questions about what was found during the examination.  If the procedure report does not answer your questions, please call your gastroenterologist to clarify.  If you requested that your care partner not be given the details of your procedure findings, then the procedure report has been included in a sealed envelope for you to review at your convenience later.  YOU SHOULD EXPECT: Some feelings of bloating in the abdomen. Passage of more gas than usual.  Walking can help get rid of the air that was put into your GI tract during the procedure and reduce the bloating. If you had a lower endoscopy (such as a colonoscopy or flexible sigmoidoscopy) you may notice spotting of blood in your stool or on the toilet paper. If you underwent a bowel prep for your procedure, you may not have a normal bowel movement for a few days.  Please Note:  You might notice some irritation and congestion in your nose or some drainage.  This is from the oxygen used during your procedure.  There is no need for concern and it should clear up in a day or so.  SYMPTOMS TO REPORT IMMEDIATELY:   Following lower endoscopy (colonoscopy or flexible sigmoidoscopy):  Excessive amounts of blood in the stool  Significant tenderness or worsening of abdominal pains  Swelling of the abdomen that is new, acute  Fever of 100F or higher   For urgent or emergent issues, a gastroenterologist can be reached at any hour by calling [phone]. Do not use MyChart messaging for urgent concerns.    DIET:  We do recommend a small meal at first, but then you may proceed to your regular diet.  Drink plenty of fluids but you should avoid alcoholic beverages for 24 hours.  ACTIVITY:  You should  plan to take it easy for the rest of today and you should NOT DRIVE or use heavy machinery until tomorrow (because of the sedation medicines used during the test).    FOLLOW UP: Our staff will call the number listed on your records 48-72 hours following your procedure to check on you and address any questions or concerns that you may have regarding the information given to you following your procedure. If we do not reach you, we will leave a message.  We will attempt to reach you two times.  During this call, we will ask if you have developed any symptoms of COVID 19. If you develop any symptoms (ie: fever, flu-like symptoms, shortness of breath, cough etc.) before then, please call [phone].  If you test positive for Covid 19 in the 2 weeks post procedure, please call and report this information to [place].    If any biopsies were taken you will be contacted by phone or by letter within the next 1-3 weeks.  Please call us at [phone] if you have not heard about the biopsies in 3 weeks.    SIGNATURES/CONFIDENTIALITY: You and/or your care partner have signed paperwork which will be entered into your electronic medical record.  These signatures attest to the fact that that the information above on your After Visit Summary has been reviewed and is understood.  Full responsibility of the confidentiality of this discharge information lies with you and/or your care-partner.

## 2020-01-14 NOTE — Progress Notes (Signed)
Pt's states no medical or surgical changes since previsit or office visit.  Vs by [employer] RN  [employer]

## 2020-01-14 NOTE — Telephone Encounter (Signed)
-----   Message from [name], MD sent at 01/14/2020 11:16 AM EDT -----   Please call the patient.  His T bili is again elevated. This is due to elevated INdirect bilirubin and so almost certainly not a sign of anything serious. 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  Let's get repeat LFTs in 2 months. [employer]

## 2020-01-14 NOTE — Progress Notes (Signed)
Report given to PACU, vss 

## 2020-01-14 NOTE — Op Note (Signed)
[place] [facility] Patient Name: [name] Procedure Date: 01/14/2020 7:43 AM MRN: [id number] Endoscopist: [name] , MD Age: 74 Referring MD:  Date of Birth: Aug 10, 1945 Gender: Male Account #: [id number] Procedure:                Colonoscopy Indications:              Chronic diarrhea Medicines:                Monitored Anesthesia Care Procedure:                Pre-Anesthesia Assessment:                           - Prior to the procedure, a History and Physical                            was performed, and patient medications and                            allergies were reviewed. The patient's tolerance of                            previous anesthesia was also reviewed. The risks                            and benefits of the procedure and the sedation                            options and risks were discussed with the patient.                            All questions were answered, and informed consent                            was obtained. Prior Anticoagulants: The patient has                            taken Eliquis (apixaban), last dose was 2 days                            prior to procedure. ASA Grade Assessment: III - A                            patient with severe systemic disease. After                            reviewing the risks and benefits, the patient was                            deemed in satisfactory condition to undergo the                            procedure.  After obtaining informed consent, the colonoscope                            was passed under direct vision. Throughout the                            procedure, the patient's blood pressure, pulse, and                            oxygen saturations were monitored continuously. The                            Colonoscope was introduced through the anus and                            advanced to the the terminal ileum. The colonoscopy                             was performed without difficulty. The patient                            tolerated the procedure well. The quality of the                            bowel preparation was good. The terminal ileum,                            ileocecal valve, appendiceal orifice, and rectum                            were photographed. Scope In: 8:05:36 AM Scope Out: 8:17:33 AM Scope Withdrawal Time: 0 hours 8 minutes 9 seconds  Total Procedure Duration: 0 hours 11 minutes 57 seconds  Findings:                 The terminal ileum appeared normal.                           The entire examined colon appeared normal on direct                            and retroflexion views.                           Biopsies for histology were taken with a cold                            forceps from the entire colon for evaluation of                            microscopic colitis. Complications:            No immediate complications. Estimated blood loss:                            None. Estimated  Blood Loss:     Estimated blood loss: none. Impression:               - The examined portion of the ileum was normal.                           - The entire examined colon is normal on direct and                            retroflexion views.                           - Biopsies were taken with a cold forceps from the                            entire colon for evaluation of microscopic colitis. Recommendation:           - Patient has a contact number available for                            emergencies. The signs and symptoms of potential                            delayed complications were discussed with the                            patient. Return to normal activities tomorrow.                            Written discharge instructions were provided to the                            patient.                           - Resume previous diet.                           - Continue present medications. Please start taking                             one OTC imodium every morning shortly after you                            wake for now.                           - Await pathology results.                           - Hepatic function panel to be drawn today to work                            up your elevated total bilirubin (on 11/2019 labs). [name], MD 01/14/2020 8:20:40 AM This report has been signed electronically.

## 2020-01-16 ENCOUNTER — Telehealth: Payer: Self-pay | Admitting: Physician Assistant

## 2020-01-16 NOTE — Telephone Encounter (Signed)
[name],    This is a DJ patient with with chronic diarrhea who had colonoscopy 8/27. Patient called through service this weekend feeling worse with fever, lower abdominal pain and diarrhea.    C diff toxin was negative on 09/29/19 GI pathogen panel when DJ saw him in office.  I called him back after he had spoken with our APP.  The symptoms he has had for the last couple of days were different than his previous chronic diarrhea.  On this occasion he was having vomiting, fever, abdominal cramps and diarrhea.  His vomiting has since resolved, he is able to eat a little food, he still has some bloating and intermittent lower abdominal cramps and some diarrhea under control with Imodium. He thinks" I got some kind of stomach bug", which is most likely the case.  I am off 8/30 (post-call) and DJ is as well.  Please call this patient Monday morning to check up on him and give this to [facility] DOD if needed.  Patient was encouraged to call me back if needed, especially if symptoms escalate.  - HD

## 2020-01-17 ENCOUNTER — Ambulatory Visit (INDEPENDENT_AMBULATORY_CARE_PROVIDER_SITE_OTHER)
Admission: RE | Admit: 2020-01-17 | Discharge: 2020-01-17 | Disposition: A | Discharge status: Home / Self Care | Payer: Medicare Other | Source: Ambulatory Visit | Attending: Gastroenterology | Admitting: Gastroenterology

## 2020-01-17 ENCOUNTER — Other Ambulatory Visit: Payer: Self-pay

## 2020-01-17 ENCOUNTER — Telehealth: Payer: Self-pay | Admitting: Gastroenterology

## 2020-01-17 DIAGNOSIS — R109 Unspecified abdominal pain: Secondary | ICD-10-CM

## 2020-01-17 DIAGNOSIS — M47816 Spondylosis without myelopathy or radiculopathy, lumbar region: Secondary | ICD-10-CM | POA: Diagnosis not present

## 2020-01-17 DIAGNOSIS — Z20828 Contact with and (suspected) exposure to other viral communicable diseases: Secondary | ICD-10-CM | POA: Diagnosis not present

## 2020-01-17 DIAGNOSIS — R197 Diarrhea, unspecified: Secondary | ICD-10-CM | POA: Diagnosis not present

## 2020-01-17 DIAGNOSIS — R140 Abdominal distension (gaseous): Secondary | ICD-10-CM | POA: Diagnosis not present

## 2020-01-17 NOTE — Telephone Encounter (Signed)
Sorry to hear this. Can he tolerate PO? If he's getting dehydrated and not tolerating anything PO he may need to go to the ED or urgent care for fluids.  And is he still having abdominal pain or has that resolved? Can you help clarify that for me?  His colonoscopy was fairly unremarkable on Friday. Sounds like diarrhea worse than previous baseline. If he has a lot of pain can do xray abdomen to ensure nothing acute. Would also recommend CBC and repeating GI pathogen panel if fever / worsening diarrhea. If he can't function at home with this / concern for dehydration or worsening symptoms may need to go to the ED in light of fever

## 2020-01-17 NOTE — Telephone Encounter (Signed)
Okay; thanks.

## 2020-01-17 NOTE — Telephone Encounter (Signed)
See alternate phone note 8/30

## 2020-01-17 NOTE — Telephone Encounter (Signed)
The pt states he is continuing to have abd pain however, not as bad.  He states he is able to keep liquids down and prefers to come for xray, labs and repeat stool test.  He will come in this afternoon.

## 2020-01-17 NOTE — Telephone Encounter (Signed)
Dr Havery Moros the pt had a temp of 103 F temporal with explosive diarrhea over night and in to today.  He has had one episode of vomiting and states he is not doing well.  Please see the on-call note from Dr Loletha Carrow.  He asked to have you review if he continues to have problems.  He was tested for COVID today and will not have results for 3 days.

## 2020-01-18 ENCOUNTER — Telehealth: Payer: Self-pay

## 2020-01-18 ENCOUNTER — Other Ambulatory Visit: Payer: Self-pay | Admitting: Rheumatology

## 2020-01-18 ENCOUNTER — Other Ambulatory Visit: Payer: Medicare Other

## 2020-01-18 ENCOUNTER — Ambulatory Visit (INDEPENDENT_AMBULATORY_CARE_PROVIDER_SITE_OTHER): Payer: Medicare Other | Admitting: Internal Medicine

## 2020-01-18 ENCOUNTER — Other Ambulatory Visit (INDEPENDENT_AMBULATORY_CARE_PROVIDER_SITE_OTHER): Payer: Medicare Other

## 2020-01-18 ENCOUNTER — Ambulatory Visit (INDEPENDENT_AMBULATORY_CARE_PROVIDER_SITE_OTHER)
Admission: RE | Admit: 2020-01-18 | Discharge: 2020-01-18 | Disposition: A | Discharge status: Home / Self Care | Payer: Medicare Other | Source: Ambulatory Visit | Attending: Internal Medicine | Admitting: Internal Medicine

## 2020-01-18 ENCOUNTER — Other Ambulatory Visit: Payer: Self-pay

## 2020-01-18 DIAGNOSIS — R109 Unspecified abdominal pain: Secondary | ICD-10-CM | POA: Diagnosis not present

## 2020-01-18 DIAGNOSIS — R509 Fever, unspecified: Secondary | ICD-10-CM | POA: Insufficient documentation

## 2020-01-18 DIAGNOSIS — R197 Diarrhea, unspecified: Secondary | ICD-10-CM | POA: Diagnosis not present

## 2020-01-18 DIAGNOSIS — R918 Other nonspecific abnormal finding of lung field: Secondary | ICD-10-CM | POA: Diagnosis not present

## 2020-01-18 DIAGNOSIS — R899 Unspecified abnormal finding in specimens from other organs, systems and tissues: Secondary | ICD-10-CM | POA: Diagnosis not present

## 2020-01-18 DIAGNOSIS — A09 Infectious gastroenteritis and colitis, unspecified: Secondary | ICD-10-CM | POA: Diagnosis not present

## 2020-01-18 DIAGNOSIS — R194 Change in bowel habit: Secondary | ICD-10-CM | POA: Diagnosis not present

## 2020-01-18 LAB — CBC WITH DIFFERENTIAL/PLATELET
Basophils Absolute: 0 10*3/uL (ref 0.0–0.1)
Basophils Relative: 1.2 % (ref 0.0–3.0)
Eosinophils Absolute: 0.1 10*3/uL (ref 0.0–0.7)
Eosinophils Relative: 4.1 % (ref 0.0–5.0)
HCT: 42.2 % (ref 39.0–52.0)
Hemoglobin: 14.5 g/dL (ref 13.0–17.0)
Lymphocytes Relative: 31.3 % (ref 12.0–46.0)
Lymphs Abs: 0.9 10*3/uL (ref 0.7–4.0)
MCHC: 34.4 g/dL (ref 30.0–36.0)
MCV: 89.1 fl (ref 78.0–100.0)
Monocytes Absolute: 0.5 10*3/uL (ref 0.1–1.0)
Monocytes Relative: 18 % — ABNORMAL HIGH (ref 3.0–12.0)
Neutro Abs: 1.2 10*3/uL — ABNORMAL LOW (ref 1.4–7.7)
Neutrophils Relative %: 45.4 % (ref 43.0–77.0)
Platelets: 199 10*3/uL (ref 150.0–400.0)
RBC: 4.74 Mil/uL (ref 4.22–5.81)
RDW: 13.5 % (ref 11.5–15.5)
WBC: 2.7 10*3/uL — ABNORMAL LOW (ref 4.0–10.5)

## 2020-01-18 LAB — HEPATIC FUNCTION PANEL
ALT: 41 U/L (ref 0–53)
AST: 43 U/L — ABNORMAL HIGH (ref 0–37)
Albumin: 3.9 g/dL (ref 3.5–5.2)
Alkaline Phosphatase: 84 U/L (ref 39–117)
Bilirubin, Direct: 0.3 mg/dL (ref 0.0–0.3)
Total Bilirubin: 1.1 mg/dL (ref 0.2–1.2)
Total Protein: 6.5 g/dL (ref 6.0–8.3)

## 2020-01-18 LAB — URINALYSIS, ROUTINE W REFLEX MICROSCOPIC
Bilirubin Urine: NEGATIVE
Leukocytes,Ua: NEGATIVE
Nitrite: NEGATIVE
Specific Gravity, Urine: >=1.03 — AB (ref 1.000–1.030)
Urine Glucose: NEGATIVE
Urobilinogen, UA: 4 — AB (ref 0.0–1.0)
pH: 6 (ref 5.0–8.0)

## 2020-01-18 LAB — COMPREHENSIVE METABOLIC PANEL
ALT: 41 U/L (ref 0–53)
AST: 43 U/L — ABNORMAL HIGH (ref 0–37)
Albumin: 3.9 g/dL (ref 3.5–5.2)
Alkaline Phosphatase: 84 U/L (ref 39–117)
BUN: 17 mg/dL (ref 6–23)
CO2: 23 mEq/L (ref 19–32)
Calcium: 8.9 mg/dL (ref 8.4–10.5)
Chloride: 104 mEq/L (ref 96–112)
Creatinine, Ser: 0.93 mg/dL (ref 0.40–1.50)
GFR: 79.02 mL/min (ref >60.00)
Glucose, Bld: 126 mg/dL — ABNORMAL HIGH (ref 70–99)
Potassium: 3.4 mEq/L — ABNORMAL LOW (ref 3.5–5.1)
Sodium: 134 mEq/L — ABNORMAL LOW (ref 135–145)
Total Bilirubin: 1.2 mg/dL (ref 0.2–1.2)
Total Protein: 6.5 g/dL (ref 6.0–8.3)

## 2020-01-18 NOTE — Progress Notes (Signed)
Patient ID: [name], male   DOB: Jun 09, 1945, 74 y.o.   MRN: [id number]  Cumulative time during 7-day interval 20 min, there was not an associated office visit for this concern within a 7 day period.  Verbal consent for services obtained from patient prior to services given.  Names of all persons present for services: Cathlean Cower, MD, patient  Chief complaint: pain  History, background, results pertinent:  Here with somewhat vague complaints, note pt visit if phone only due to pt wary of covid in person exposure;  Does c/o lower right side and abd pain that seems to radiate from right ribs/costal margin to crotch, has some nausea after eating, bowel frequency increased to 3-4 times per day, has recent darker more orange urine, fatty foods can seem to increase the pain, and has BM with ? Foul order and some incontince and accident.  LFTs with increase total bili only.  C diff testing neg per GI.  8/27 colonoscopy negative for malignancy, but the day after has been feeling warm;  Was tested covid neg but still had sweats, chills without cough or nausea.  Past Medical History:  Diagnosis Date  . AICD (automatic cardioverter/defibrillator) present   . Allergic rhinitis   . Allergy   . Anginal pain ([place]) 05/26/14   chest pain after chasing dog  . Atypical nevus of back 04/27/2003   moderate - mid lower back  . Basal cell carcinoma 01/26/2008   right cheek - MOHs  . CAD (coronary artery disease)    hx of stent- 2005 RCA  . Cataract    beginning stage both eyes  . CHF (congestive heart failure) (HCC)    pacemaker Medtronic    . Chronic back pain    intermittent  . Constipation   . Dizziness   . Dysrhythmia    right bundle branch block   . Esophageal stricture   . GERD (gastroesophageal reflux disease)   . Hemoptysis   . Hiatal hernia   . History of colonic polyps    hyperplastic  . HTN (hypertension)   . Hyperlipidemia   . Hypertrophy of prostate with urinary obstruction and  other lower urinary tract symptoms (LUTS)   . OA (osteoarthritis)   . OSA (obstructive sleep apnea)    cpap- 10   . 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 Pericarditis 07/06/2019  . Pneumonia   . Pre-diabetes    no medications  . Shortness of breath    with exertion  . Sleep apnea    cpap  . Testicular hypofunction    No results found for this or any previous visit (from the past 48 hour(s)).  Current Outpatient Medications on File Prior to Visit  Medication Sig Dispense Refill  . acetaminophen (TYLENOL) 500 MG tablet Take 500 mg by mouth 2 (two) times daily.    Marland Kitchen atorvastatin (LIPITOR) 20 MG tablet Take 1 tablet (20 mg total) by mouth daily. 90 tablet 3  . b complex vitamins tablet Take 1 tablet by mouth daily with lunch.     . calcium carbonate (OSCAL) 1500 (600 Ca) MG TABS tablet Take 600 mg by mouth every evening.    . carvedilol (COREG) 25 MG tablet TAKE 1 TABLET(25 MG) BY MOUTH TWICE DAILY WITH A MEAL 180 tablet 3  . CINNAMON PO Take 1 tablet by mouth daily with lunch.     . Coenzyme Q10 (CO  Q 10) 100 MG CAPS Take 1 capsule by mouth daily with lunch.     . colchicine 0.6 MG tablet Take 1 tablet (0.6 mg total) by mouth daily. 30 tablet 0  . diclofenac sodium (VOLTAREN) 1 % GEL Apply 1 application topically 4 (four) times daily as needed (pain).   3  . ELIQUIS 5 MG TABS tablet TAKE 1 TABLET BY MOUTH 2 TIMES DAILY 510 tablet 1  . folic acid (FOLVITE) 258 MCG tablet Take 400 mcg by mouth daily.     . Glucosamine HCl (GLUCOSAMINE PO) Take 1,500 mg by mouth every evening.    . hydroCHLOROthiazide (MICROZIDE PO) hydrochlorothiazide    . hydrocortisone 2.5 % cream Apply 1 application topically 2 (two) times daily as needed (rash on nose).     . hydroxychloroquine (PLAQUENIL) 200 MG tablet TAKE 1 TABLET BY MOUTH TWICE DAILY MONDAY THROUGH FRIDAY ONLY. DO NOT TAKE SATURDAY OR SUNDAY 120 tablet 0  .  hydroxypropyl methylcellulose / hypromellose (ISOPTO TEARS / GONIOVISC) 2.5 % ophthalmic solution Place 1 drop into both eyes 4 (four) times daily as needed for dry eyes.     . Multiple Vitamin (MULTIVITAMIN) capsule Take 1 capsule by mouth daily.      . Multiple Vitamins-Minerals (OCUVITE PO) Take 1 tablet by mouth at bedtime.    . nitroGLYCERIN (NITROSTAT) 0.4 MG SL tablet Place 1 tablet (0.4 mg total) under the tongue every 5 (five) minutes x 3 doses as needed for chest pain. 25 tablet 0  . NON FORMULARY CPAP machine with sleep.    . pantoprazole (PROTONIX) 40 MG tablet TAKE 1 TABLET BY MOUTH EVERY DAY BEFORE BREAKFAST GENERIC EQUIVALENT FOR PROTONIX 90 tablet 1  . Probiotic Product (PROBIOTIC DAILY PO) Take 1 capsule by mouth daily with lunch.     . sacubitril-valsartan (ENTRESTO) 49-51 MG Take 1 tablet by mouth 2 (two) times daily. 180 tablet 2  . Saw Palmetto, Serenoa repens, (SAW PALMETTO PO) Take 1 capsule by mouth every evening.    Marland Kitchen spironolactone (ALDACTONE) 25 MG tablet TAKE 1/2 TABLET(12.5 MG) BY MOUTH DAILY 45 tablet 3  . tamsulosin (FLOMAX) 0.4 MG CAPS capsule Take 0.4 mg by mouth at bedtime.    . Testosterone 20 % CREA Apply 2 mLs topically daily. Rub on shoulder    . TURMERIC PO Take 1 tablet by mouth daily with lunch.      No current facility-administered medications on file prior to visit.   Lab Results  Component Value Date   WBC 2.7 (L) 01/18/2020   HGB 14.5 01/18/2020   HCT 42.2 01/18/2020   PLT 199.0 01/18/2020   GLUCOSE 126 (H) 01/18/2020   CHOL 97 01/24/2019   TRIG 161 (H) 01/24/2019   HDL 27 (L) 01/24/2019   LDLCALC 38 01/24/2019   ALT 41 01/18/2020   AST 43 (H) 01/18/2020   NA 134 (L) 01/18/2020   K 3.4 (L) 01/18/2020   CL 104 01/18/2020   CREATININE 0.93 01/18/2020   BUN 17 01/18/2020   CO2 23 01/18/2020   TSH 2.402 01/24/2019   PSA 3.29 12/31/2017   INR 1.3 (H) 01/24/2019   HGBA1C 5.9 (H) 06/25/2019   A/P/next steps:   Fever - etiology unclear -  for cxr, and labs as ordered  Right side pain - also for abd u/s  HTN - stable overall by history and exam, recent data reviewed with pt, and pt to continue medical treatment as before,  to f/u any worsening symptoms or  concerns  Cathlean Cower MD

## 2020-01-18 NOTE — Telephone Encounter (Signed)
  Follow up Call-  Call back number 01/14/2020 08/08/2017  Post procedure Call Back phone  # [phone]  Permission to leave phone message Yes Yes  Some recent data might be hidden     Patient questions:  Do you have a fever, pain , or abdominal swelling? Yes.   Pain Score  5 *  Have you tolerated food without any problems? No.  Have you been able to return to your normal activities? No.  Do you have any questions about your discharge instructions: Diet   No. Medications  No. Follow up visit  No.  Do you have questions or concerns about your Care? No.  Actions: * If pain score is 4 or above: Physician/ provider Notified : Owens Loffler, MD. Date 01/18/20 at Time 15.  1. Have you developed a fever since your procedure? YES  2.   Have you had an respiratory symptoms (SOB or cough) since your procedure? NO  3.   Have you tested positive for COVID 19 since your procedure NO  4.   Have you had any family members/close contacts diagnosed with the COVID 19 since your procedure?  NO  Patient has called [facility] multiple times. Spoke will several doctors. Spoke with nurse as well. Pain scale 5. Waiting covid result. See notes.   If yes to any of these questions please route to Joylene [name], RN and Joella Prince, RN

## 2020-01-19 ENCOUNTER — Encounter: Payer: Self-pay | Admitting: Internal Medicine

## 2020-01-19 NOTE — Telephone Encounter (Signed)
Last Visit:09/01/2019 Next Visit:02/03/2020 UDS: 11/24/2019 Narc Agreement:11/24/2019  Last Fill: 12/16/2019  Okay to refill Tramadol?

## 2020-01-20 ENCOUNTER — Other Ambulatory Visit: Payer: Self-pay | Admitting: Internal Medicine

## 2020-01-20 ENCOUNTER — Encounter: Payer: Self-pay | Admitting: Internal Medicine

## 2020-01-20 DIAGNOSIS — R3129 Other microscopic hematuria: Secondary | ICD-10-CM

## 2020-01-20 LAB — GI PROFILE, STOOL, PCR

## 2020-01-21 NOTE — Progress Notes (Signed)
Office Visit Note  Patient: [name]             Date of Birth: 07/20/1945           MRN: [id number]             PCP: Biagio Borg, MD Referring: Biagio Borg, MD Visit Date: 02/03/2020 Occupation: @GUAROCC @  Subjective:  Other (patient would like to discuss labs/concerns that he brought with him )   History of Present Illness: [name] is a 74 y.o. male with history of rheumatoid arthritis and osteoarthritis.  He states he had colonoscopy about 2 weeks ago.  After that he got really sick.  He states he had Covid test x2 which were negative.  He is about 95% better now.  He has been taking Plaquenil for rheumatoid arthritis and osteoarthritis overlap.  He has aggressive rheumatoid arthritis erosive disease which is not active now.  He has been taking Plaquenil which she has been tolerating well.  He brought several labs today for review which included elevated bilirubin elevated AST.  He also brought a CT scan of his abdomen which showed some degenerative changes in the lumbar spine.  Activities of Daily Living:  Patient reports morning stiffness for 30-40 minutes.   Patient Reports nocturnal pain.  Difficulty dressing/grooming: Denies Difficulty climbing stairs: Reports Difficulty getting out of chair: Reports Difficulty using hands for taps, buttons, cutlery, and/or writing: Reports  Review of Systems  Constitutional: Positive for fatigue.  HENT: Negative for mouth sores, mouth dryness and nose dryness.   Eyes: Positive for dryness. Negative for pain, itching and visual disturbance.  Respiratory: Positive for shortness of breath. Negative for difficulty breathing.   Cardiovascular: Negative for chest pain and palpitations.  Gastrointestinal: Positive for diarrhea. Negative for blood in stool and constipation.  Endocrine: Negative for increased urination.  Genitourinary: Negative for difficulty urinating.  Musculoskeletal: Positive for arthralgias, joint pain, joint  swelling, myalgias, morning stiffness, muscle tenderness and myalgias.  Skin: Negative for color change, rash and redness.  Allergic/Immunologic: Negative for susceptible to infections.  Neurological: Positive for weakness. Negative for dizziness, numbness, headaches and memory loss.  Hematological: Negative for bruising/bleeding tendency.  Psychiatric/Behavioral: Negative for confusion.    PMFS History:  Patient Active Problem List   Diagnosis Date Noted  . Fever 01/18/2020  . Pericarditis 07/06/2019  . Right hip pain 07/06/2019  . Acute chest pain 01/24/2019  . Prediabetes 12/24/2018  . Rheumatoid arthritis ([place]) 12/24/2018  . Preventative health care 06/25/2018  . Incarcerated ventral hernia 03/20/2018  . DCM (dilated cardiomyopathy) ([place]) 02/11/2017  . Chronic systolic heart failure ([place]) 01/15/2017  . History of pneumonia 10/23/2016  . Osteoarthritis 10/23/2016  . Primary osteoarthritis of both feet 10/04/2016  . Primary osteoarthritis of left knee 10/04/2016  . DDD (degenerative disc disease), lumbar 10/04/2016  . Hemoptysis 05/27/2016  . Overweight (BMI 25.0-29.9) 03/14/2016  . 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  . Parotid mass 08/23/2015  . Dyspnea 11/29/2014  . Synovial cyst of lumbar facet joint 09/26/2014  . Incisional hernia, without obstruction or gangrene 12/06/2013  . Nausea alone 08/27/2013  . Colon cancer screening 08/27/2013  . 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 Benign prostatic hyperplasia with urinary obstruction 06/18/2010  . Other specified disorder of stomach and duodenum 12/16/2008  . 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 GERD 09/02/2008  . COLONIC POLYPS, HYPERPLASTIC 05/24/2007  . Obstructive sleep apnea 05/24/2007  . ALLERGIC RHINITIS 05/24/2007  .  ESOPHAGEAL STRICTURE 05/24/2007  . HIATAL HERNIA 05/24/2007  . Hyperlipidemia 02/06/2007  . Essential hypertension 02/06/2007  . CAD (coronary artery disease) 02/06/2007  . Primary  osteoarthritis of both hands 02/06/2007  . Neck pain on right side 02/06/2007    Past Medical History:  Diagnosis Date  . AICD (automatic cardioverter/defibrillator) present   . Allergic rhinitis   . Allergy   . Anginal pain ([place]) 05/26/14   chest pain after chasing dog  . Atypical nevus of back 04/27/2003   moderate - mid lower back  . Basal cell carcinoma 01/26/2008   right cheek - MOHs  . CAD (coronary artery disease)    hx of stent- 2005 RCA  . Cataract    beginning stage both eyes  . CHF (congestive heart failure) (HCC)    pacemaker [employer]    . Chronic back pain    intermittent  . Constipation   . Dizziness   . Dysrhythmia    right bundle branch block   . Esophageal stricture   . GERD (gastroesophageal reflux disease)   . Hemoptysis   . Hiatal hernia   . History of colonic polyps    hyperplastic  . HTN (hypertension)   . Hyperlipidemia   . Hypertrophy of prostate with urinary obstruction and other lower urinary tract symptoms (LUTS)   . OA (osteoarthritis)   . OSA (obstructive sleep apnea)    cpap- 10   . 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 Pericarditis 07/06/2019  . Pneumonia   . Pre-diabetes    no medications  . Shortness of breath    with exertion  . Sleep apnea    cpap  . Testicular hypofunction     Family History  Problem Relation Age of Onset  . Coronary artery disease Father   . Diabetes Father   . Sudden death Father        due to heart disease  . Heart disease Father   . Heart disease Mother   . Hypertension Other   . Stomach cancer Paternal Grandmother   . COPD Brother   . Arthritis Brother   . Heart failure Brother   . Cancer Brother        lung   . Arthritis Daughter   . Colon cancer Neg Hx   . Esophageal cancer Neg Hx   . Colon polyps Neg Hx   . Ulcerative colitis Neg Hx    Past Surgical History:  Procedure Laterality Date  . BIV ICD  INSERTION CRT-D N/A 03/27/2017   Procedure: BIV ICD INSERTION CRT-D;  Surgeon: Constance Haw, MD;  Location: [facility];  Service: Cardiovascular;  Laterality: N/A;  . CARDIAC CATHETERIZATION     '05, last 2009, showing patent RCA stent  . COLONOSCOPY  12/2007   HYPERPLASTIC POLYP  . COLONOSCOPY  12/2019  . COLONOSCOPY WITH PROPOFOL N/A 06/02/2014   Procedure: COLONOSCOPY WITH PROPOFOL;  Surgeon: [name], MD;  Location: [facility];  Service: Endoscopy;  Laterality: N/A;  . CORONARY ANGIOPLASTY  08/2003  . ESOPHAGOGASTRODUODENOSCOPY (EGD) WITH PROPOFOL N/A 06/02/2014   Procedure: ESOPHAGOGASTRODUODENOSCOPY (EGD) WITH PROPOFOL;  Surgeon: [name], MD;  Location: [facility];  Service: Endoscopy;  Laterality: N/A;  . hernia surgery x 3     Bilateral Inguinal, Umbicial  . INSERTION OF MESH N/A 03/20/2018   Procedure: INSERTION OF MESH;  Surgeon: Alphonsa Overall, MD;  Location:  [facility];  Service: General;  Laterality: N/A;  . IRRIGATION AND DEBRIDEMENT ABSCESS Left 08/14/2012   Procedure: IRRIGATION AND DEBRIDEMENT LEFT INGUINAL BOIL ;  Surgeon: Ailene Rud, MD;  Location: [facility];  Service: Urology;  Laterality: Left;  . JOINT REPLACEMENT Right 2017  . KNEE ARTHROPLASTY    . LEFT HEART CATH AND CORONARY ANGIOGRAPHY N/A 01/24/2019   Procedure: LEFT HEART CATH AND CORONARY ANGIOGRAPHY;  Surgeon: Martinique, Peter M, MD;  Location: [facility];  Service: Cardiovascular;  Laterality: N/A;  . LEFT HEART CATHETERIZATION WITH CORONARY ANGIOGRAM N/A 05/26/2014   Procedure: LEFT HEART CATHETERIZATION WITH CORONARY ANGIOGRAM;  Surgeon: Blane Ohara, MD;  Location[facility];  Service: Cardiovascular;  Laterality: N/A;  . LUMBAR LAMINECTOMY/DECOMPRESSION MICRODISCECTOMY Left 09/26/2014   Procedure: Lumbar Laminectomy for resection of synovial cyst  Lumbar five- sacral one left;  Surgeon: Kary Kos, MD;  Location: [facility];  Service: Neurosurgery;  Laterality: Left;  .  MOLE REMOVAL Left 03/20/2018   Procedure: MOLE REMOVAL;  Surgeon: Alphonsa Overall, MD;  Location: [facility];  Service: General;  Laterality: Left;  . Oral surg to removed growth from ?sinus  10/2010   ?Dermoid removed by DrRiggs  . PACEMAKER INSERTION  04/03/2017  . POLYPECTOMY    . RCA stenting     '05 RCA  . right toe surgery  Right    Cyst   . RIGHT/LEFT HEART CATH AND CORONARY ANGIOGRAPHY N/A 01/15/2017   Procedure: RIGHT/LEFT HEART CATH AND CORONARY ANGIOGRAPHY;  Surgeon: [name], MD;  Location: [facility];  Service: Cardiovascular;  Laterality: N/A;  . s/p right knee arthroscopy  2005  . thumb surgery Right   . TOTAL KNEE ARTHROPLASTY Right 03/12/2016   Procedure: RIGHT TOTAL KNEE ARTHROPLASTY;  Surgeon: Paralee Cancel, MD;  Location: [facility];  Service: Orthopedics;  Laterality: Right;  . UPPER GASTROINTESTINAL ENDOSCOPY    . VENTRAL HERNIA REPAIR N/A 03/20/2018   Procedure: LAPAROSCOPIC VENTRAL INCISIONAL  HERNIA ERAS PATHWAY;  Surgeon: Alphonsa Overall, MD;  Location: [facility];  Service: General;  Laterality: N/A;   Social History   Social History Narrative  . Not on file   Immunization History  Administered Date(s) Administered  . Influenza Split 03/21/2011  . Influenza Whole 04/06/2009, 04/09/2010  . Influenza, High Dose Seasonal PF 04/20/2015, 04/15/2016, 02/21/2017  . Influenza,inj,Quad PF,6+ Mos 03/24/2014  . Influenza-Unspecified 02/26/2018, 02/18/2019  . PFIZER SARS-COV-2 Vaccination 05/27/2019, 06/17/2019  . Pneumococcal Conjugate-13 11/29/2014  . Pneumococcal Polysaccharide-23 06/25/2018  . Tdap 11/29/2014  . Zoster 05/20/2006  . Zoster Recombinat (Shingrix) 06/07/2018, 09/26/2018     Objective: Vital Signs: BP 138/75 (BP Location: Right Arm, Patient Position: Sitting, Cuff Size: Normal)   Pulse 79   Resp 15   Ht 5\' 7"  (1.702 m)   Wt 186 lb 9.6 oz (84.6 kg)   BMI 29.23 kg/m    Physical Exam Vitals and nursing note reviewed.  Constitutional:       Appearance: He is well-developed.  HENT:     Head: Normocephalic and atraumatic.  Eyes:     Conjunctiva/sclera: Conjunctivae normal.     Pupils: Pupils are equal, round, and reactive to light.  Cardiovascular:     Rate and Rhythm: Normal rate and regular rhythm.     Heart sounds: Normal heart sounds.  Pulmonary:     Effort: Pulmonary effort is normal.     Breath sounds: Normal breath sounds.  Abdominal:     General: Bowel  sounds are normal.     Palpations: Abdomen is soft.  Musculoskeletal:     Cervical back: Normal range of motion and neck supple.  Skin:    General: Skin is warm and dry.     Capillary Refill: Capillary refill takes less than 2 seconds.  Neurological:     Mental Status: He is alert and oriented to person, place, and time.  Psychiatric:        Behavior: Behavior normal.      Musculoskeletal Exam: Good range of motion of cervical spine.  Shoulder joints, elbow joints with good range of motion.  He has some limitation range of motion of the wrist joints with no synovitis.  Bilateral MCP joint thickening with no synovitis was noted.  PIP and DIP thickening and subluxation of some of the PIP joints was noted.  He has limited range of motion of his right hip joint.  Good range of motion of bilateral knee joints was noted.  There was no tenderness across MTPs or PIPs.  CDAI Exam: CDAI Score: -- Patient Global: --; Provider Global: -- Swollen: --; Tender: -- Joint Exam 02/03/2020   No joint exam has been documented for this visit   There is currently no information documented on the homunculus. Go to the Rheumatology activity and complete the homunculus joint exam.  Investigation: No additional findings.  Imaging: DG Chest 2 View  Result Date: 01/19/2020 CLINICAL DATA:  Hypertension, history of CHF. EXAM: CHEST - 2 VIEW COMPARISON:  01/24/2019 FINDINGS: Low lung volumes without consolidation. Linear left lower lobe opacities. No pleural effusions. Stable cardiac  and mediastinal contour. Unchanged position of a cardiac rhythm maintenance device. IMPRESSION: Linear left lower lobe opacities, favor atelectasis/scar. No consolidation. Electronically Signed   By: Margaretha Sheffield MD   On: 01/19/2020 15:43   US Abdomen Complete  Result Date: 02/01/2020 CLINICAL DATA:  Fever, RIGHT upper quadrant pain, history CHF, coronary artery disease, hypertension, former smoker EXAM: ABDOMEN ULTRASOUND COMPLETE COMPARISON:  06/11/2007 FINDINGS: Gallbladder: Normally distended without stones or wall thickening. No pericholecystic fluid or sonographic Murphy sign. Common bile duct: Diameter: 5 mm, normal Liver: Echogenic and mildly heterogeneous parenchyma, likely fatty infiltration though this can be seen with cirrhosis and certain infiltrative disorders. No focal hepatic mass or nodularity. Portal vein is patent on color Doppler imaging with normal direction of blood flow towards the liver. IVC: Suboptimally visualized due to bowel gas, visualized portion normal appearance Pancreas: Normal appearance Spleen: Normal appearance, 5.0 cm length Right Kidney: Length: 11.1 cm. Normal morphology without mass or hydronephrosis. Left Kidney: Length: 10.1 cm. Normal morphology without mass or hydronephrosis. Abdominal aorta: Visualized portion normal caliber, portions obscured by bowel gas. Other findings: No free fluid IMPRESSION: Probable fatty infiltration of liver as above. Incomplete IVC and aortic visualization. Remainder of exam unremarkable. Electronically Signed   By: Lavonia Dana M.D.   On: 02/01/2020 12:54   DG Abd 2 Views  Result Date: 01/18/2020 CLINICAL DATA:  Abdominal pain, diarrhea, bloating for 6 months, recent colonoscopy EXAM: ABDOMEN - 2 VIEW COMPARISON:  None. FINDINGS: Supine and upright frontal views of the abdomen and pelvis are obtained. Bowel gas pattern is unremarkable without obstruction or ileus. There are no abdominal masses. Punctate radiodensities within the  right mid abdomen likely reflect material within the fecal stream. No urinary tract calculi. The lung bases are clear. No free gas in the greater peritoneal sac. Prominent lower lumbar facet hypertrophy and spondylosis. IMPRESSION: 1. Unremarkable bowel gas pattern. Electronically  Signed   By: Randa Ngo M.D.   On: 01/18/2020 03:51   CUP PACEART REMOTE DEVICE CHECK  Result Date: 02/03/2020 Scheduled remote reviewed. Normal device function.  Next remote 91 days. Kathy Breach, RN, CCDS, [employer]  XR Foot 2 Views Left  Result Date: 02/03/2020 Narrowing of all MTP joints was noted.  PIP and DIP narrowing was noted.  Metatarsal tarsal and subtalar joint space narrowing was noted.  Inferior calcaneal spur was noted.  No erosive changes were noted. Impression: These findings are consistent with severe rheumatoid arthritis and osteoarthritis overlap.  XR Foot 2 Views Right  Result Date: 02/03/2020 Juxta-articular osteopenia was noted.  First MTP, PIP and DIP narrowing was noted.  No erosive changes were noted.  No intertarsal, tibiotalar or subtalar joint space narrowing was noted.  Inferior calcaneal spur was noted. Impression: These findings are consistent with rheumatoid arthritis and osteoarthritis overlap.  XR Hand 2 View Left  Result Date: 02/03/2020 Severe CMC narrowing and subluxation was noted.  Juxta-articular osteopenia was noted.  Narrowing of all MCP joints with most severe narrowing of the second MCP joint was noted.  Narrowing of all PIP and DIP joints was noted.  Intercarpal and radiocarpal joint space narrowing was noted.  Erosive changes in carpal bones and ulnar styloid was noted. Impression: These findings are consistent with severe erosive rheumatoid arthritis and osteoarthritis overlap.  XR Hand 2 View Right  Result Date: 02/03/2020 Juxta-articular osteopenia was noted.  Severe narrowing and subluxation of first and second MCP joint was noted.  Severe PIP and DIP  narrowing was noted.  Severe CMC narrowing and subluxation was noted.  Intercarpal and radiocarpal joint space narrowing was noted.  Erosive changes were noted in the carpal bones and ulna and radius. Impression: These findings are consistent with severe erosive rheumatoid arthritis and osteoarthritis overlap.   Recent Labs: Lab Results  Component Value Date   WBC 2.7 (L) 01/18/2020   HGB 14.5 01/18/2020   PLT 199.0 01/18/2020   NA 134 (L) 01/18/2020   K 3.4 (L) 01/18/2020   CL 104 01/18/2020   CO2 23 01/18/2020   GLUCOSE 126 (H) 01/18/2020   BUN 17 01/18/2020   CREATININE 0.93 01/18/2020   BILITOT 1.1 01/18/2020   ALKPHOS 84 01/18/2020   AST 43 (H) 01/18/2020   ALT 41 01/18/2020   PROT 6.5 01/18/2020   ALBUMIN 3.9 01/18/2020   CALCIUM 8.9 01/18/2020   GFRAA 96 11/24/2019    Speciality Comments: PLQ eye exam: 02/24/2019 normal. Dr. Francis Dowse. Follow up in 1 year.  Procedures:  No procedures performed Allergies: Sulfonamide derivatives   Assessment / Plan:     Visit Diagnoses: Rheumatoid arthritis involving multiple sites with positive rheumatoid factor (HCC) - CCP +, RF -, 14-3-3 eta negative, Sed rate WNL. - Plan: XR Hand 2 View Right, XR Hand 2 View Left, XR Foot 2 Views Right, XR Foot 2 Views Left.  X-ray showed severe erosive rheumatoid arthritis and osteoarthritis overlap.  No comparison films were available.  We had detailed discussion in the past about more aggressive medications with patient had been hesitant.  He had been doing fairly well on Plaquenil and does not want to change the medication at this point.  High risk medication use - PLQ 200 mg 1 tablet BID M-F. eye exam: 02/24/2019.  His most recent labs showed neutropenia and elevated LFTs after colonoscopy and he had possible viral infection.  We will check labs today.  Medication  monitoring encounter - tramadol 1 TABLET(50 MG) BY MOUTH AT BEDTIME AS NEEDED. UDS & narcotic agreement: 11/24/2019.  Primary  osteoarthritis of both hands-he has severe osteoarthritis in his hands.  Primary osteoarthritis of left knee-he continues to have some discomfort in his left knee.  Status post right knee replacement-doing well.  Primary osteoarthritis of both feet-he has rheumatoid arthritis and osteoarthritis arthritis overlap in his feet with chronic discomfort.  DDD (degenerative disc disease), lumbar-he has chronic pain in his lower back.  Other medical problems are listed as follows:  History of coronary artery disease - followed by cardiologist.  History of CHF (congestive heart failure)  History of hyperlipidemia  History of hypertension  History of colon polyps  History of gastroesophageal reflux (GERD)  History of sleep apnea  History of gastric polyp  Pacemaker  History of BPH  Educated about COVID-19 virus infection-he is fully vaccinated.  He can get booster.  Use of mask, social distancing and hand hygiene was discussed.  Orders: Orders Placed This Encounter  Procedures  . XR Hand 2 View Right  . XR Hand 2 View Left  . XR Foot 2 Views Right  . XR Foot 2 Views Left  . CBC with Differential/Platelet  . COMPLETE METABOLIC PANEL WITH GFR   No orders of the defined types were placed in this encounter.  .  Follow-Up Instructions: Return in about 4 months (around 06/04/2020) for Rheumatoid arthritis, Osteoarthritis.   Bo Merino, MD  Note - This record has been created using Editor, commissioning.  Chart creation errors have been sought, but may not always  have been located. Such creation errors do not reflect on  the standard of medical care.

## 2020-01-25 ENCOUNTER — Ambulatory Visit (INDEPENDENT_AMBULATORY_CARE_PROVIDER_SITE_OTHER): Payer: Medicare Other

## 2020-01-25 ENCOUNTER — Encounter: Payer: Self-pay | Admitting: Internal Medicine

## 2020-01-25 DIAGNOSIS — L821 Other seborrheic keratosis: Secondary | ICD-10-CM | POA: Diagnosis not present

## 2020-01-25 DIAGNOSIS — Z95810 Presence of automatic (implantable) cardiac defibrillator: Secondary | ICD-10-CM

## 2020-01-25 DIAGNOSIS — I5022 Chronic systolic (congestive) heart failure: Secondary | ICD-10-CM | POA: Diagnosis not present

## 2020-01-25 DIAGNOSIS — L812 Freckles: Secondary | ICD-10-CM | POA: Diagnosis not present

## 2020-01-25 DIAGNOSIS — L738 Other specified follicular disorders: Secondary | ICD-10-CM | POA: Diagnosis not present

## 2020-01-25 DIAGNOSIS — D2372 Other benign neoplasm of skin of left lower limb, including hip: Secondary | ICD-10-CM | POA: Diagnosis not present

## 2020-01-25 DIAGNOSIS — N401 Enlarged prostate with lower urinary tract symptoms: Secondary | ICD-10-CM | POA: Diagnosis not present

## 2020-01-25 NOTE — Assessment & Plan Note (Signed)
Etiology unclear - see notes

## 2020-01-25 NOTE — Patient Instructions (Signed)
Please continue all other medications as before, and refills have been done if requested.  Please have the pharmacy call with any other refills you may need.  Please continue your efforts at being more active, low cholesterol diet, and weight control.  You are otherwise up to date with prevention measures today.  Please keep your appointments with your specialists as you may have planned  You will be contacted regarding the referral for: abd u/s  Please go to the XRAY Department in the first floor for the x-ray testing  Please go to the LAB at the blood drawing area for the tests to be done  You will be contacted by phone if any changes need to be made immediately.  Otherwise, you will receive a letter about your results with an explanation, but please check with MyChart first.  Please remember to sign up for MyChart if you have not done so, as this will be important to you in the future with finding out test results, communicating by private email, and scheduling acute appointments online when needed.

## 2020-01-28 DIAGNOSIS — E291 Testicular hypofunction: Secondary | ICD-10-CM | POA: Diagnosis not present

## 2020-01-28 DIAGNOSIS — N401 Enlarged prostate with lower urinary tract symptoms: Secondary | ICD-10-CM | POA: Diagnosis not present

## 2020-01-28 DIAGNOSIS — R351 Nocturia: Secondary | ICD-10-CM | POA: Diagnosis not present

## 2020-01-28 NOTE — Progress Notes (Signed)
EPIC Encounter for ICM Monitoring  Patient Name: [name] is a 74 y.o. male Date: 01/28/2020 Primary Care Physican: Biagio Borg, MD Primary Cardiologist:[place] Electrophysiologist:[place] Bi-V Pacing:93.4% 8/24/2021Office Weight:188lbs  Time in AT/AF0.0 hr/day (0.0%)   Spoke with patient and reports feeling well at this time.  Denies fluid symptoms.  He had a virus after colonoscopy for a few days.    Optivol thoracic impedancenormal.  Takes Spirolactone 25 mg take 0.5 tablet daily.  Recommendations:No changes and encouraged to call if experiencing any fluid symptoms.  Follow-up plan: ICM clinic phone appointment on 02/28/2020.   91 day device clinic remote transmission 02/03/2020.    EP/Cardiology Office Visits: 07/11/2020 with Dr. Curt Bears.    Copy of ICM check sent to Dr. Curt Bears.  3 month ICM trend: 01/25/2020    1 Year ICM trend:       [name], RN 01/28/2020 10:16 AM

## 2020-02-01 ENCOUNTER — Encounter: Payer: Self-pay | Admitting: Internal Medicine

## 2020-02-01 ENCOUNTER — Ambulatory Visit
Admission: RE | Admit: 2020-02-01 | Discharge: 2020-02-01 | Disposition: A | Discharge status: Home / Self Care | Payer: Medicare Other | Source: Ambulatory Visit | Attending: Internal Medicine | Admitting: Internal Medicine

## 2020-02-01 DIAGNOSIS — R1011 Right upper quadrant pain: Secondary | ICD-10-CM | POA: Diagnosis not present

## 2020-02-01 DIAGNOSIS — R509 Fever, unspecified: Secondary | ICD-10-CM

## 2020-02-01 DIAGNOSIS — K828 Other specified diseases of gallbladder: Secondary | ICD-10-CM | POA: Diagnosis not present

## 2020-02-03 ENCOUNTER — Ambulatory Visit: Payer: Self-pay

## 2020-02-03 ENCOUNTER — Ambulatory Visit (INDEPENDENT_AMBULATORY_CARE_PROVIDER_SITE_OTHER): Payer: Medicare Other | Admitting: *Deleted

## 2020-02-03 ENCOUNTER — Encounter: Payer: Self-pay | Admitting: Rheumatology

## 2020-02-03 ENCOUNTER — Ambulatory Visit (INDEPENDENT_AMBULATORY_CARE_PROVIDER_SITE_OTHER): Payer: Medicare Other | Admitting: Rheumatology

## 2020-02-03 ENCOUNTER — Other Ambulatory Visit: Payer: Self-pay

## 2020-02-03 VITALS — BP 138/75 | HR 79 | Resp 15 | Ht 67.0 in | Wt 186.6 lb

## 2020-02-03 DIAGNOSIS — M51369 Other intervertebral disc degeneration, lumbar region without mention of lumbar back pain or lower extremity pain: Secondary | ICD-10-CM

## 2020-02-03 DIAGNOSIS — M19072 Primary osteoarthritis, left ankle and foot: Secondary | ICD-10-CM

## 2020-02-03 DIAGNOSIS — I255 Ischemic cardiomyopathy: Secondary | ICD-10-CM

## 2020-02-03 DIAGNOSIS — Z860100 Personal history of colon polyps, unspecified: Secondary | ICD-10-CM

## 2020-02-03 DIAGNOSIS — I420 Dilated cardiomyopathy: Secondary | ICD-10-CM | POA: Diagnosis not present

## 2020-02-03 DIAGNOSIS — M19041 Primary osteoarthritis, right hand: Secondary | ICD-10-CM | POA: Diagnosis not present

## 2020-02-03 DIAGNOSIS — Z87438 Personal history of other diseases of male genital organs: Secondary | ICD-10-CM

## 2020-02-03 DIAGNOSIS — Z8679 Personal history of other diseases of the circulatory system: Secondary | ICD-10-CM

## 2020-02-03 DIAGNOSIS — Z5181 Encounter for therapeutic drug level monitoring: Secondary | ICD-10-CM | POA: Diagnosis not present

## 2020-02-03 DIAGNOSIS — M19071 Primary osteoarthritis, right ankle and foot: Secondary | ICD-10-CM

## 2020-02-03 DIAGNOSIS — Z7189 Other specified counseling: Secondary | ICD-10-CM

## 2020-02-03 DIAGNOSIS — Z79899 Other long term (current) drug therapy: Secondary | ICD-10-CM | POA: Diagnosis not present

## 2020-02-03 DIAGNOSIS — Z950 Presence of cardiac pacemaker: Secondary | ICD-10-CM

## 2020-02-03 DIAGNOSIS — Z8719 Personal history of other diseases of the digestive system: Secondary | ICD-10-CM

## 2020-02-03 DIAGNOSIS — M0579 Rheumatoid arthritis with rheumatoid factor of multiple sites without organ or systems involvement: Secondary | ICD-10-CM

## 2020-02-03 DIAGNOSIS — M19042 Primary osteoarthritis, left hand: Secondary | ICD-10-CM

## 2020-02-03 DIAGNOSIS — M1712 Unilateral primary osteoarthritis, left knee: Secondary | ICD-10-CM | POA: Diagnosis not present

## 2020-02-03 DIAGNOSIS — Z96651 Presence of right artificial knee joint: Secondary | ICD-10-CM | POA: Diagnosis not present

## 2020-02-03 DIAGNOSIS — Z8639 Personal history of other endocrine, nutritional and metabolic disease: Secondary | ICD-10-CM

## 2020-02-03 DIAGNOSIS — M5136 Other intervertebral disc degeneration, lumbar region: Secondary | ICD-10-CM | POA: Diagnosis not present

## 2020-02-03 DIAGNOSIS — Z8669 Personal history of other diseases of the nervous system and sense organs: Secondary | ICD-10-CM

## 2020-02-03 LAB — CUP PACEART REMOTE DEVICE CHECK
Battery Remaining Longevity: 50 mo
Battery Voltage: 2.97 V
Brady Statistic AP VP Percent: 0.79 %
Brady Statistic AP VS Percent: 0.01 %
Brady Statistic AS VP Percent: 97.04 %
Brady Statistic AS VS Percent: 2.16 %
Brady Statistic RA Percent Paced: 0.8 %
Brady Statistic RV Percent Paced: 37.52 %
Date Time Interrogation Session: 20210916012502
HighPow Impedance: 64 Ohm
Implantable Lead Implant Date: 20181108
Implantable Lead Implant Date: 20181108
Implantable Lead Implant Date: 20181108
Implantable Lead Location: 753858
Implantable Lead Location: 753859
Implantable Lead Location: 753860
Implantable Lead Model: 4298
Implantable Lead Model: 5076
Implantable Pulse Generator Implant Date: 20181108
Lead Channel Impedance Value: 1064 Ohm
Lead Channel Impedance Value: 1064 Ohm
Lead Channel Impedance Value: 1121 Ohm
Lead Channel Impedance Value: 199.5 Ohm
Lead Channel Impedance Value: 216.848
Lead Channel Impedance Value: 261.639
Lead Channel Impedance Value: 261.639
Lead Channel Impedance Value: 292.308
Lead Channel Impedance Value: 399 Ohm
Lead Channel Impedance Value: 399 Ohm
Lead Channel Impedance Value: 456 Ohm
Lead Channel Impedance Value: 475 Ohm
Lead Channel Impedance Value: 513 Ohm
Lead Channel Impedance Value: 551 Ohm
Lead Channel Impedance Value: 551 Ohm
Lead Channel Impedance Value: 703 Ohm
Lead Channel Impedance Value: 722 Ohm
Lead Channel Impedance Value: 760 Ohm
Lead Channel Pacing Threshold Amplitude: 0.5 V
Lead Channel Pacing Threshold Amplitude: 0.625 V
Lead Channel Pacing Threshold Amplitude: 2.375 V
Lead Channel Pacing Threshold Pulse Width: 0.4 ms
Lead Channel Pacing Threshold Pulse Width: 0.4 ms
Lead Channel Pacing Threshold Pulse Width: 1 ms
Lead Channel Sensing Intrinsic Amplitude: 12.25 mV
Lead Channel Sensing Intrinsic Amplitude: 12.25 mV
Lead Channel Sensing Intrinsic Amplitude: 3.125 mV
Lead Channel Sensing Intrinsic Amplitude: 3.125 mV
Lead Channel Setting Pacing Amplitude: 2 V
Lead Channel Setting Pacing Amplitude: 2.5 V
Lead Channel Setting Pacing Amplitude: 2.75 V
Lead Channel Setting Pacing Pulse Width: 0.4 ms
Lead Channel Setting Pacing Pulse Width: 1 ms
Lead Channel Setting Sensing Sensitivity: 0.3 mV

## 2020-02-03 NOTE — Patient Instructions (Addendum)
COVID-19 vaccine recommendations:   COVID-19 vaccine is recommended for everyone (unless you are allergic to a vaccine component), even if you are on a medication that suppresses your immune system.   If you are on Methotrexate, Cellcept (mycophenolate), Rinvoq, Morrie Sheldon, and Olumiant- hold the medication for 1 week after each vaccine. Hold Methotrexate for 2 weeks after the single dose COVID-19 vaccine.   If you are on Orencia subcutaneous injection - hold medication one week prior to and one week after the first COVID-19 vaccine dose (only).   If you are on Orencia IV infusions- time vaccination administration so that the first COVID-19 vaccination will occur four weeks after the infusion and postpone the subsequent infusion by one week.   If you are on Cyclophosphamide or Rituxan infusions please contact your doctor prior to receiving the COVID-19 vaccine.   Do not take Tylenol or any anti-inflammatory medications (NSAIDs) 24 hours prior to the COVID-19 vaccination.   There is no direct evidence about the efficacy of the COVID-19 vaccine in individuals who are on medications that suppress the immune system.   Even if you are fully vaccinated, and you are on any medications that suppress your immune system, please continue to wear a mask, maintain at least six feet social distance and practice hand hygiene.   If you develop a COVID-19 infection, please contact your PCP or our office to determine if you need antibody infusion.  The booster vaccine is now available for immunocompromised patients. It is advised that if you had Pfizer vaccine you should get Coca-Cola booster.  If you had a Moderna vaccine then you should get a Moderna booster. Johnson and Wynetta Emery does not have a booster vaccine at this time.  Please see the following web sites for updated information.    https://www.[email or web address].org/Portals/0/Files/COVID-19-Vaccination-Patient-Resources.pdf  https://[email or web address]/About-Us/Newsroom/Press-Releases/ID/1159  Standing Labs We placed an order today for your standing lab work.   Please have your standing labs drawn in December  If possible, please have your labs drawn 2 weeks prior to your appointment so that the provider can discuss your results at your appointment.  We have open lab daily Monday through Thursday from 8:30-12:30 PM and 1:30-4:30 PM and Friday from 8:30-12:30 PM and 1:30-4:00 PM at the office of Dr. Bo Merino, [facility].   Please be advised, patients with office appointments requiring lab work will take precedents over walk-in lab work.  If possible, please come for your lab work on Monday and Friday afternoons, as you may experience shorter wait times. The office is located at [address] [place], [place], [place] [address] No appointment is necessary.   Labs are drawn by [facility]. Please bring your co-pay at the time of your lab draw.  You may receive a bill from [facility] for your lab work.  If you wish to have your labs drawn at another location, please call the office 24 hours in advance to send orders.  If you have any questions regarding directions or hours of operation,  please call [phone].   As a reminder, please drink plenty of water prior to coming for your lab work. Thanks!    COVID-19 vaccine recommendations:   COVID-19 vaccine is recommended for everyone (unless you are allergic to a vaccine component), even if you are on a medication that suppresses your immune system.   If you are on Methotrexate, Cellcept (mycophenolate), Rinvoq, Morrie Sheldon, and Olumiant- hold the medication for 1 week after each vaccine. Hold Methotrexate for 2 weeks after the single dose  COVID-19 vaccine.   If you are on Orencia subcutaneous injection - hold medication one week prior to and one week  after the first COVID-19 vaccine dose (only).   If you are on Orencia IV infusions- time vaccination administration so that the first COVID-19 vaccination will occur four weeks after the infusion and postpone the subsequent infusion by one week.   If you are on Cyclophosphamide or Rituxan infusions please contact your doctor prior to receiving the COVID-19 vaccine.   Do not take Tylenol or any anti-inflammatory medications (NSAIDs) 24 hours prior to the COVID-19 vaccination.   There is no direct evidence about the efficacy of the COVID-19 vaccine in individuals who are on medications that suppress the immune system.   Even if you are fully vaccinated, and you are on any medications that suppress your immune system, please continue to wear a mask, maintain at least six feet social distance and practice hand hygiene.   If you develop a COVID-19 infection, please contact your PCP or our office to determine if you need antibody infusion.  The booster vaccine is now available for immunocompromised patients. It is advised that if you had Pfizer vaccine you should get Coca-Cola booster.  If you had a Moderna vaccine then you should get a Moderna booster. Johnson and Wynetta Emery does not have a booster vaccine at this time.  Please see the following web sites for updated information.   https://www.[email or web address].org/Portals/0/Files/COVID-19-Vaccination-Patient-Resources.pdf  https://[email or web address]/About-Us/Newsroom/Press-Releases/ID/1159

## 2020-02-04 LAB — CBC WITH DIFFERENTIAL/PLATELET
Absolute Monocytes: 593 cells/uL (ref 200–950)
Basophils Absolute: 49 cells/uL (ref 0–200)
Basophils Relative: 1 %
Eosinophils Absolute: 93 cells/uL (ref 15–500)
Eosinophils Relative: 1.9 %
HCT: 44.9 % (ref 38.5–50.0)
Hemoglobin: 15.1 g/dL (ref 13.2–17.1)
Lymphs Abs: 1411 cells/uL (ref 850–3900)
MCH: 30.2 pg (ref 27.0–33.0)
MCHC: 33.6 g/dL (ref 32.0–36.0)
MCV: 89.8 fL (ref 80.0–100.0)
MPV: 10.4 fL (ref 7.5–12.5)
Monocytes Relative: 12.1 %
Neutro Abs: 2754 cells/uL (ref 1500–7800)
Neutrophils Relative %: 56.2 %
Platelets: 285 10*3/uL (ref 140–400)
RBC: 5 10*6/uL (ref 4.20–5.80)
RDW: 12.6 % (ref 11.0–15.0)
Total Lymphocyte: 28.8 %
WBC: 4.9 10*3/uL (ref 3.8–10.8)

## 2020-02-04 LAB — COMPLETE METABOLIC PANEL WITH GFR
AG Ratio: 1.8 (calc) (ref 1.0–2.5)
ALT: 17 U/L (ref 9–46)
AST: 19 U/L (ref 10–35)
Albumin: 4.2 g/dL (ref 3.6–5.1)
Alkaline phosphatase (APISO): 65 U/L (ref 35–144)
BUN: 14 mg/dL (ref 7–25)
CO2: 23 mmol/L (ref 20–32)
Calcium: 9.4 mg/dL (ref 8.6–10.3)
Chloride: 105 mmol/L (ref 98–110)
Creat: 0.84 mg/dL (ref 0.70–1.18)
GFR, Est African American: 99 mL/min/{1.73_m2} (ref >=60)
GFR, Est Non African American: 86 mL/min/{1.73_m2} (ref >=60)
Globulin: 2.3 g/dL (calc) (ref 1.9–3.7)
Glucose, Bld: 101 mg/dL — ABNORMAL HIGH (ref 65–99)
Potassium: 4.4 mmol/L (ref 3.5–5.3)
Sodium: 138 mmol/L (ref 135–146)
Total Bilirubin: 1.3 mg/dL — ABNORMAL HIGH (ref 0.2–1.2)
Total Protein: 6.5 g/dL (ref 6.1–8.1)

## 2020-02-06 DIAGNOSIS — Z23 Encounter for immunization: Secondary | ICD-10-CM | POA: Diagnosis not present

## 2020-02-07 NOTE — Progress Notes (Signed)
Remote ICD transmission.   

## 2020-02-18 ENCOUNTER — Telehealth: Payer: Self-pay

## 2020-02-18 NOTE — Telephone Encounter (Signed)
Called to arrange 1 year echo and office visit with Dr. Burt Knack (due 06/24/2020).  Left message to call back and ask for [facility].

## 2020-02-18 NOTE — Telephone Encounter (Signed)
Scheduled patient for echo 06/22/20 and visit with Dr. Burt Knack 06/26/20. He was grateful for assistance.

## 2020-02-23 ENCOUNTER — Telehealth: Payer: Self-pay | Admitting: Physician Assistant

## 2020-02-23 ENCOUNTER — Other Ambulatory Visit: Payer: Self-pay | Admitting: Cardiovascular Disease

## 2020-02-24 NOTE — Telephone Encounter (Signed)
Patient called to check on refill request. Patient states he has been out of medication for two days now. Pharmacy told patient rx was denied. Patient requests if possible please send in refill this pm.

## 2020-02-24 NOTE — Telephone Encounter (Signed)
Last Visit: 02/03/2020 Next Visit: 06/08/2020 UDS:11/24/2019 C/W treatment Narc Agreement: 11/24/2019  Last Fill: 01/19/2020  Okay to refill tramadol?

## 2020-02-24 NOTE — Telephone Encounter (Signed)
FYI: Patient just called back to advise [facility] sent an e-mail stating RX is ready to pick up.

## 2020-02-26 DIAGNOSIS — Z23 Encounter for immunization: Secondary | ICD-10-CM | POA: Diagnosis not present

## 2020-02-28 ENCOUNTER — Ambulatory Visit (INDEPENDENT_AMBULATORY_CARE_PROVIDER_SITE_OTHER): Payer: Medicare Other

## 2020-02-28 DIAGNOSIS — Z95810 Presence of automatic (implantable) cardiac defibrillator: Secondary | ICD-10-CM | POA: Diagnosis not present

## 2020-02-28 DIAGNOSIS — I5022 Chronic systolic (congestive) heart failure: Secondary | ICD-10-CM | POA: Diagnosis not present

## 2020-02-29 ENCOUNTER — Telehealth: Payer: Self-pay

## 2020-02-29 NOTE — Telephone Encounter (Signed)
Remote ICM transmission received.  Attempted call to patient regarding ICM remote transmission and left detailed message per DPR.  Advised to return call for any fluid symptoms or questions. Next ICM remote transmission scheduled 04/04/2020.     

## 2020-02-29 NOTE — Progress Notes (Signed)
EPIC Encounter for ICM Monitoring  Patient Name: [name] is a 74 y.o. male Date: 02/29/2020 Primary Care Physican: Biagio Borg, MD Primary Cardiologist:Cooper Electrophysiologist:Camnitz Bi-V Pacing:96.9% 8/24/2021Office Weight:188lbs  Time in AT/AF0.0 hr/day (0.0%)   Attempted call to patient and unable to reach.  Left detailed message per DPR regarding transmission. Transmission reviewed.   Optivol thoracic impedancenormal.  Takes Spirolactone 25 mg take 0.5 tablet daily.  Recommendations:Left voice mail with ICM number and encouraged to call if experiencing any fluid symptoms.  Follow-up plan: ICM clinic phone appointment on11/16/2021. 91 day device clinic remote transmission 05/04/2020.   EP/Cardiology Office Visits:07/11/2020 with Dr.Camnitz.   Copy of ICM check sent to [facility].   3 month ICM trend: 02/28/2020    1 Year ICM trend:       [name], RN 02/29/2020 1:02 PM

## 2020-03-09 DIAGNOSIS — Z79899 Other long term (current) drug therapy: Secondary | ICD-10-CM | POA: Diagnosis not present

## 2020-03-09 DIAGNOSIS — M0569 Rheumatoid arthritis of multiple sites with involvement of other organs and systems: Secondary | ICD-10-CM | POA: Diagnosis not present

## 2020-03-09 DIAGNOSIS — H2513 Age-related nuclear cataract, bilateral: Secondary | ICD-10-CM | POA: Diagnosis not present

## 2020-03-09 DIAGNOSIS — H35033 Hypertensive retinopathy, bilateral: Secondary | ICD-10-CM | POA: Diagnosis not present

## 2020-03-13 ENCOUNTER — Other Ambulatory Visit: Payer: Self-pay | Admitting: Cardiology

## 2020-03-17 ENCOUNTER — Other Ambulatory Visit: Payer: Self-pay | Admitting: Cardiovascular Disease

## 2020-03-20 ENCOUNTER — Encounter: Payer: Self-pay | Admitting: Gastroenterology

## 2020-03-20 ENCOUNTER — Other Ambulatory Visit (INDEPENDENT_AMBULATORY_CARE_PROVIDER_SITE_OTHER): Payer: Medicare Other

## 2020-03-20 ENCOUNTER — Ambulatory Visit (INDEPENDENT_AMBULATORY_CARE_PROVIDER_SITE_OTHER): Payer: Medicare Other | Admitting: Gastroenterology

## 2020-03-20 DIAGNOSIS — R634 Abnormal weight loss: Secondary | ICD-10-CM

## 2020-03-20 DIAGNOSIS — I255 Ischemic cardiomyopathy: Secondary | ICD-10-CM | POA: Diagnosis not present

## 2020-03-20 DIAGNOSIS — R197 Diarrhea, unspecified: Secondary | ICD-10-CM | POA: Diagnosis not present

## 2020-03-20 LAB — HEPATIC FUNCTION PANEL
ALT: 20 U/L (ref 0–53)
AST: 20 U/L (ref 0–37)
Albumin: 4.5 g/dL (ref 3.5–5.2)
Alkaline Phosphatase: 67 U/L (ref 39–117)
Bilirubin, Direct: 0.2 mg/dL (ref 0.0–0.3)
Total Bilirubin: 1.2 mg/dL (ref 0.2–1.2)
Total Protein: 7.3 g/dL (ref 6.0–8.3)

## 2020-03-20 LAB — BASIC METABOLIC PANEL
BUN: 18 mg/dL (ref 6–23)
CO2: 26 mEq/L (ref 19–32)
Calcium: 9.6 mg/dL (ref 8.4–10.5)
Chloride: 104 mEq/L (ref 96–112)
Creatinine, Ser: 0.85 mg/dL (ref 0.40–1.50)
GFR: 84.68 mL/min (ref >60.00)
Glucose, Bld: 105 mg/dL — ABNORMAL HIGH (ref 70–99)
Potassium: 3.9 mEq/L (ref 3.5–5.1)
Sodium: 140 mEq/L (ref 135–145)

## 2020-03-20 LAB — SEDIMENTATION RATE: Sed Rate: 13 mm/hr (ref 0–20)

## 2020-03-20 LAB — TSH: TSH: 3.79 u[IU]/mL (ref 0.35–4.50)

## 2020-03-20 NOTE — Progress Notes (Signed)
Review of pertinent gastrointestinal problems: 1.  Routine risk for colon cancer.  Colonoscopy Dr. Ardis Hughs January 2016 was completely normal.  Colonoscopy 2009 Dr. Verl Blalock removed a single hyperplastic polyp. 2. incidentally noted duodenal polyp partially resected endoscopically TVA (09/2008) (Dr. Henrene Pastor), repeat procedure December 2012 found small residual tubular adenoma at the site, appeared to becompletely removed endoscopically, 2012 repeat EGD with small residual adenoma at the site removed. repeat EGD 2016 with small adenoma at the site removed.  EGD Dr. Ardis Hughs March 2019 found small amount of apparent recurrent polypoid growth removed with biopsy forceps, this was tubular adenoma on pathology.  I recommended repeat EGD at 3-year interval 3.  Diarrheal illness.  2021.  GI pathogen panel negative.  Eventual colonoscopy August 2021 showed completely normal terminal ileum and also completely normal-appearing colon.  Biopsies were taken for microscopic colitis and they were all normal.  He was recommended to start taking a single Imodium every morning after waking.  HPI: This is a pleasant 74 year old man whom I last saw the time of a colonoscopy about 3 months ago.  Since then he is still bothered by intermittent explosive urgent diarrhea.  Often with greasy foods.  He has been very very gassy.  He was having some pains in his right lower quadrant but those have completely resolved.  He has taken Imodium and says this constipates him quite a bit.  He will to push and strain quite a bit.  His weight is down 15 pounds since his last visit here 5 months ago without intention  ROS: complete GI ROS as described in HPI, all other review negative.   Past Medical History:  Diagnosis Date  . AICD (automatic cardioverter/defibrillator) present   . Allergic rhinitis   . Allergy   . Anginal pain ([place]) 05/26/14   chest pain after chasing dog  . Atypical nevus of back 04/27/2003   moderate -  mid lower back  . Basal cell carcinoma 01/26/2008   right cheek - MOHs  . CAD (coronary artery disease)    hx of stent- 2005 RCA  . Cataract    beginning stage both eyes  . CHF (congestive heart failure) (HCC)    pacemaker Medtronic    . Chronic back pain    intermittent  . Constipation   . Dizziness   . Dysrhythmia    right bundle branch block   . Esophageal stricture   . GERD (gastroesophageal reflux disease)   . Hemoptysis   . Hiatal hernia   . History of colonic polyps    hyperplastic  . HTN (hypertension)   . Hyperlipidemia   . Hypertrophy of prostate with urinary obstruction and other lower urinary tract symptoms (LUTS)   . OA (osteoarthritis)   . OSA (obstructive sleep apnea)    cpap- 10   . 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 Pericarditis 07/06/2019  . Pneumonia   . Pre-diabetes    no medications  . Shortness of breath    with exertion  . Sleep apnea    cpap  . Testicular hypofunction     Past Surgical History:  Procedure Laterality Date  . BIV ICD INSERTION CRT-D N/A 03/27/2017   Procedure: BIV ICD INSERTION CRT-D;  Surgeon: Constance Haw, MD;  Location: [facility];  Service: Cardiovascular;  Laterality: N/A;  . CARDIAC CATHETERIZATION     '05, last 2009, showing patent RCA  stent  . COLONOSCOPY  12/2007   HYPERPLASTIC POLYP  . COLONOSCOPY  12/2019  . COLONOSCOPY WITH PROPOFOL N/A 06/02/2014   Procedure: COLONOSCOPY WITH PROPOFOL;  Surgeon: [name], MD;  Location: [facility];  Service: Endoscopy;  Laterality: N/A;  . CORONARY ANGIOPLASTY  08/2003  . ESOPHAGOGASTRODUODENOSCOPY (EGD) WITH PROPOFOL N/A 06/02/2014   Procedure: ESOPHAGOGASTRODUODENOSCOPY (EGD) WITH PROPOFOL;  Surgeon: [name], MD;  Location: [facility];  Service: Endoscopy;  Laterality: N/A;  . hernia surgery x 3     Bilateral Inguinal, Umbicial  . INSERTION OF MESH N/A  03/20/2018   Procedure: INSERTION OF MESH;  Surgeon: Alphonsa Overall, MD;  Location: [facility];  Service: General;  Laterality: N/A;  . IRRIGATION AND DEBRIDEMENT ABSCESS Left 08/14/2012   Procedure: IRRIGATION AND DEBRIDEMENT LEFT INGUINAL BOIL ;  Surgeon: Ailene Rud, MD;  Location: [facility];  Service: Urology;  Laterality: Left;  . JOINT REPLACEMENT Right 2017  . KNEE ARTHROPLASTY    . LEFT HEART CATH AND CORONARY ANGIOGRAPHY N/A 01/24/2019   Procedure: LEFT HEART CATH AND CORONARY ANGIOGRAPHY;  Surgeon: Martinique, Peter M, MD;  Location: [facility];  Service: Cardiovascular;  Laterality: N/A;  . LEFT HEART CATHETERIZATION WITH CORONARY ANGIOGRAM N/A 05/26/2014   Procedure: LEFT HEART CATHETERIZATION WITH CORONARY ANGIOGRAM;  Surgeon: Blane Ohara, MD;  Location: [facility] CATH LAB;  Service: Cardiovascular;  Laterality: N/A;  . LUMBAR LAMINECTOMY/DECOMPRESSION MICRODISCECTOMY Left 09/26/2014   Procedure: Lumbar Laminectomy for resection of synovial cyst  Lumbar five- sacral one left;  Surgeon: Kary Kos, MD;  Location: [facility];  Service: Neurosurgery;  Laterality: Left;  . MOLE REMOVAL Left 03/20/2018   Procedure: MOLE REMOVAL;  Surgeon: Alphonsa Overall, MD;  Location: [facility];  Service: General;  Laterality: Left;  . Oral surg to removed growth from ?sinus  10/2010   ?Dermoid removed by DrRiggs  . PACEMAKER INSERTION  04/03/2017  . POLYPECTOMY    . RCA stenting     '05 RCA  . right toe surgery  Right    Cyst   . RIGHT/LEFT HEART CATH AND CORONARY ANGIOGRAPHY N/A 01/15/2017   Procedure: RIGHT/LEFT HEART CATH AND CORONARY ANGIOGRAPHY;  Surgeon: [name], MD;  Location: [facility];  Service: Cardiovascular;  Laterality: N/A;  . s/p right knee arthroscopy  2005  . thumb surgery Right   . TOTAL KNEE ARTHROPLASTY Right 03/12/2016   Procedure: RIGHT TOTAL KNEE ARTHROPLASTY;  Surgeon: Paralee Cancel, MD;  [facility]: [facility];  Service: Orthopedics;  Laterality: Right;  . UPPER  GASTROINTESTINAL ENDOSCOPY    . VENTRAL HERNIA REPAIR N/A 03/20/2018   Procedure: [place];  Surgeon: Alphonsa Overall, MD;  Location: [facility];  Service: General;  Laterality: N/A;    Current Outpatient Medications  Medication Sig Dispense Refill  . acetaminophen (TYLENOL) 500 MG tablet Take 500 mg by mouth 2 (two) times daily.    Marland Kitchen atorvastatin (LIPITOR) 20 MG tablet TAKE 1 TABLET BY MOUTH DAILY 90 tablet 0  . b complex vitamins tablet Take 1 tablet by mouth daily with lunch.     . calcium carbonate (OSCAL) 1500 (600 Ca) MG TABS tablet Take 600 mg by mouth every evening.    . carvedilol (COREG) 25 MG tablet TAKE 1 TABLET BY MOUTH 2 TIMES DAILY. GENERIC EQUIVALENT FOR COREG. 180 tablet 3  . CINNAMON PO Take 1 tablet by mouth daily with lunch.     . Coenzyme Q10 (CO  Q 10) 100 MG CAPS Take 1 capsule by mouth daily with lunch.     . colchicine 0.6 MG tablet Take 1 tablet (0.6 mg total) by mouth daily. 30 tablet 0  . diclofenac sodium (VOLTAREN) 1 % GEL Apply 1 application topically 4 (four) times daily as needed (pain).   3  . ELIQUIS 5 MG TABS tablet TAKE 1 TABLET BY MOUTH 2 TIMES DAILY 786 tablet 1  . folic acid (FOLVITE) 767 MCG tablet Take 400 mcg by mouth daily.     . Glucosamine HCl (GLUCOSAMINE PO) Take 1,500 mg by mouth every evening.    . hydroCHLOROthiazide (MICROZIDE PO) hydrochlorothiazide    . hydrocortisone 2.5 % cream Apply 1 application topically 2 (two) times daily as needed (rash on nose).     . hydroxychloroquine (PLAQUENIL) 200 MG tablet TAKE 1 TABLET BY MOUTH TWICE DAILY MONDAY THROUGH FRIDAY ONLY. DO NOT TAKE SATURDAY OR SUNDAY 120 tablet 0  . hydroxypropyl methylcellulose / hypromellose (ISOPTO TEARS / GONIOVISC) 2.5 % ophthalmic solution Place 1 drop into both eyes 4 (four) times daily as needed for dry eyes.     . Multiple Vitamin (MULTIVITAMIN) capsule Take 1 capsule by mouth daily.      . Multiple Vitamins-Minerals (OCUVITE PO)  Take 1 tablet by mouth at bedtime.    . nitroGLYCERIN (NITROSTAT) 0.4 MG SL tablet Place 1 tablet (0.4 mg total) under the tongue every 5 (five) minutes x 3 doses as needed for chest pain. 25 tablet 0  . NON FORMULARY CPAP machine with sleep.    . pantoprazole (PROTONIX) 40 MG tablet TAKE 1 TABLET BY MOUTH EVERY DAY BEFORE BREAKFAST GENERIC EQUIVALENT FOR PROTONIX 90 tablet 1  . Probiotic Product (PROBIOTIC DAILY PO) Take 1 capsule by mouth daily with lunch.     . sacubitril-valsartan (ENTRESTO) 49-51 MG Take 1 tablet by mouth 2 (two) times daily. 180 tablet 2  . Saw Palmetto, Serenoa repens, (SAW PALMETTO PO) Take 1 capsule by mouth every evening.    Marland Kitchen spironolactone (ALDACTONE) 25 MG tablet TAKE 1/2 TABLET(12.5 MG) BY MOUTH DAILY 45 tablet 0  . tamsulosin (FLOMAX) 0.4 MG CAPS capsule Take 0.4 mg by mouth at bedtime.    . Testosterone 20 % CREA Apply 2 mLs topically daily. Rub on shoulder    . traMADol (ULTRAM) 50 MG tablet TAKE 1 TABLET(50 MG) BY MOUTH AT BEDTIME AS NEEDED 30 tablet 0  . TURMERIC PO Take 1 tablet by mouth daily with lunch.      No current facility-administered medications for this visit.    Allergies as of 03/20/2020 - Review Complete 03/20/2020  Allergen Reaction Noted  . Sulfonamide derivatives Rash 06/26/2010    Family History  Problem Relation Age of Onset  . Coronary artery disease Father   . Diabetes Father   . Sudden death Father        due to heart disease  . Heart disease Father   . Heart disease Mother   . Hypertension Other   . Stomach cancer Paternal Grandmother   . COPD Brother   . Arthritis Brother   . Heart failure Brother   . Cancer Brother        lung   . Arthritis Daughter   . Colon cancer Neg Hx   . Esophageal cancer Neg Hx   . Colon polyps Neg Hx   . Ulcerative colitis Neg Hx     Social History   Socioeconomic History  . Marital status: Married  Spouse name: Not on file  . Number of children: 1  . Years of education: Not on  file  . Highest education level: Not on file  Occupational History  . Occupation: Lobbyist: [employer]  Tobacco Use  . Smoking status: Former Smoker    Quit date: 05/20/1990    Years since quitting: 29.8  . Smokeless tobacco: Never Used  . Tobacco comment: previous 30 pack year history  Vaping Use  . Vaping Use: Never used  Substance and Sexual Activity  . Alcohol use: Yes    Comment: rare  . Drug use: Never  . Sexual activity: Not on file  Other Topics Concern  . Not on file  Social History Narrative  . Not on file   Social Determinants of Health   Financial Resource Strain:   . Difficulty of Paying Living Expenses: Not on file  Food Insecurity:   . Worried About Charity fundraiser in the Last Year: Not on file  . Ran Out of Food in the Last Year: Not on file  Transportation Needs:   . Lack of Transportation (Medical): Not on file  . Lack of Transportation (Non-Medical): Not on file  Physical Activity:   . Days of Exercise per Week: Not on file  . Minutes of Exercise per Session: Not on file  Stress:   . Feeling of Stress : Not on file  Social Connections:   . Frequency of Communication with Friends and Family: Not on file  . Frequency of Social Gatherings with Friends and Family: Not on file  . Attends Religious Services: Not on file  . Active Member of Clubs or Organizations: Not on file  . Attends Archivist Meetings: Not on file  . Marital Status: Not on file  Intimate Partner Violence:   . Fear of Current or Ex-Partner: Not on file  . Emotionally Abused: Not on file  . Physically Abused: Not on file  . Sexually Abused: Not on file     Physical Exam: BP 122/80 (BP Location: Left Arm, Patient Position: Sitting, Cuff Size: Normal)   Pulse 78   Ht 5\' 7"  (1.702 m)   Wt 183 lb 8 oz (83.2 kg)   SpO2 97%   BMI 28.74 kg/m  Constitutional: generally well-appearing Psychiatric: alert and oriented x3 Abdomen: soft,  nontender, nondistended, no obvious ascites, no peritoneal signs, normal bowel sounds No peripheral edema noted in lower extremities  Assessment and plan: 74 y.o. male with intermittent explosive diarrhea, unintentional weight loss  I recommended further testing for his symptoms above.  I am concerned about his unintentional weight loss.  He will get CBC, formal LFTs since his liver tests have been intermittently elevated, basic metabolic profile, sedimentation rate, celiac sprue testing, stool for fecal elastase.  Also CT scan abdomen pelvis with IV and oral contrast.  He will again try Imodium taken on a as needed basis seems to help but possibly is too strong.  Please see the "Patient Instructions" section for addition details about the plan.  Owens Loffler, MD [facility] 03/20/2020, 8:58 AM   Total time on date of encounter was 30 minutes (this included time spent preparing to see the patient reviewing records; obtaining and/or reviewing separately obtained history; performing a medically appropriate exam and/or evaluation; counseling and educating the patient and family if present; ordering medications, tests or procedures if applicable; and documenting clinical information in the health record).

## 2020-03-20 NOTE — Patient Instructions (Signed)
If you are age 76 or older, your body mass index should be between 23-30. Your Body mass index is 28.74 kg/m. If this is out of the aforementioned range listed, please consider follow up with your Primary Care Provider.  If you are age 64 or younger, your body mass index should be between 19-25. Your Body mass index is 28.74 kg/m. If this is out of the aformentioned range listed, please consider follow up with your Primary Care Provider.   Your provider has requested that you go to the basement level for lab work before leaving today. Press "B" on the elevator. The lab is located at the first door on the left as you exit the elevator.  You have been scheduled for a CT scan of the abdomen and pelvis at [facility], 1st floor Radiology. You are scheduled on 03-28-20  at 3:30pm. You should arrive 15 minutes prior to your appointment time for registration.  Please pick up 2 bottles of contrast from [facility] at least 3 days prior to your scan. The solution may taste better if refrigerated, but do NOT add ice or any other liquid to this solution. Shake well before drinking.   Please follow the written instructions below on the day of your exam:   1) Do not eat anything after 11:30pm (4 hours prior to your test)   2) Drink 1 bottle of contrast @ 1:30pm (2 hours prior to your exam)  Remember to shake well before drinking and do NOT pour over ice.     Drink 1 bottle of contrast @ 2:30pm (1 hour prior to your exam)   You may take any medications as prescribed with a small amount of water, if necessary. If you take any of the following medications: METFORMIN, GLUCOPHAGE, GLUCOVANCE, AVANDAMET, RIOMET, FORTAMET, ACTOPLUS MET, JANUMET, GLUMETZA or METAGLIP, you MAY be asked to HOLD this medication 48 hours AFTER the exam.   The purpose of you drinking the oral contrast is to aid in the visualization of your intestinal tract. The contrast solution may cause some diarrhea. Depending on your individual  set of symptoms, you may also receive an intravenous injection of x-ray contrast/dye. Plan on being at [facility] for 45 minutes or longer, depending on the type of exam you are having performed.   If you have any questions regarding your exam or if you need to reschedule, you may call [facility] at [phone] between the hours of 8:00 am and 5:00 pm, Monday-Friday.   Due to recent changes in healthcare laws, you may see the results of your imaging and laboratory studies on MyChart before your provider has had a chance to review them.  We understand that in some cases there may be results that are confusing or concerning to you. Not all laboratory results come back in the same time frame and the provider may be waiting for multiple results in order to interpret others.  Please give us 48 hours in order for your provider to thoroughly review all the results before contacting the office for clarification of your results.   Thank you for entrusting me with your care and choosing [facility].  Dr Jacobs  

## 2020-03-21 LAB — TISSUE TRANSGLUTAMINASE, IGA: (tTG) Ab, IgA: <1 U/mL

## 2020-03-21 LAB — IGA: Immunoglobulin A: 246 mg/dL (ref 70–320)

## 2020-03-23 ENCOUNTER — Other Ambulatory Visit: Payer: Self-pay | Admitting: *Deleted

## 2020-03-23 MED ORDER — TRAMADOL HCL 50 MG PO TABS
50.0000 mg | ORAL_TABLET | Freq: Every evening | ORAL | 0 refills | Status: DC | PRN
Start: 2020-03-23 — End: 2020-04-18

## 2020-03-23 NOTE — Telephone Encounter (Signed)
Refill request received via fax  Last Visit: 02/03/2020 Next Visit: 06/08/2020 UDS:11/24/2019 C/W treatment Narc Agreement: 11/24/2019  Last Fill:02/24/2020  Okay to refill Tramadol?

## 2020-03-24 LAB — TIQ-MISC

## 2020-03-28 ENCOUNTER — Ambulatory Visit (HOSPITAL_COMMUNITY)
Admission: RE | Admit: 2020-03-28 | Discharge: 2020-03-28 | Disposition: A | Discharge status: Home / Self Care | Payer: Medicare Other | Source: Ambulatory Visit | Attending: Gastroenterology | Admitting: Gastroenterology

## 2020-03-28 ENCOUNTER — Other Ambulatory Visit: Payer: Self-pay

## 2020-03-28 DIAGNOSIS — R197 Diarrhea, unspecified: Secondary | ICD-10-CM | POA: Diagnosis not present

## 2020-03-28 DIAGNOSIS — R634 Abnormal weight loss: Secondary | ICD-10-CM | POA: Diagnosis not present

## 2020-03-28 DIAGNOSIS — R109 Unspecified abdominal pain: Secondary | ICD-10-CM | POA: Diagnosis not present

## 2020-03-28 LAB — PANCREATIC ELASTASE, FECAL: Pancreatic Elastase-1, Stool: >500 mcg/g

## 2020-03-28 LAB — FECAL FAT, QUANTITATIVE: Specimen Total Weight: 10 g

## 2020-03-28 MED ORDER — IOHEXOL 300 MG/ML  SOLN
100.0000 mL | Freq: Once | INTRAMUSCULAR | Status: AC | PRN
  Administered 2020-03-28: 100 mL via INTRAVENOUS

## 2020-03-31 ENCOUNTER — Telehealth: Payer: Self-pay | Admitting: Gastroenterology

## 2020-03-31 MED ORDER — PANCRELIPASE (LIP-PROT-AMYL) 36000-114000 UNITS PO CPEP: ORAL_CAPSULE | ORAL | 3 refills | Status: DC

## 2020-03-31 NOTE — Telephone Encounter (Signed)
The pt has been advised and prescription sent to the pharmacy.  He will call back to make appt and states he sees Dr Dorina Hoyer urologist.  I will sent a copy to that office.

## 2020-03-31 NOTE — Telephone Encounter (Signed)
Pt is requesting a call back from a nurse regarding his CT scan results. (missed call)

## 2020-03-31 NOTE — Telephone Encounter (Signed)
Billy Banister, MD  Timothy Lasso, RN Ayren Zumbro,  Please call him. Let him know that his blood test and stool tests are unrevealing. I would like to try him on pancreatic enzyme supplements to see if this helps his intermittent explosive diarrhea. Lets try Creon 24,000. He should take 2 pills with every meal and 1 pill with every snack. Dispense as appropriate for 1 month with 3 refills. He needs return office visit with me in 6 to 8 weeks to discuss his response.   Also let him know that he has a hernia in his left groin and some of his urinary bladder is poking into the hernia. He needs a referral to urology to discuss the implications of this and consider surgery if needed. Thanks

## 2020-04-04 ENCOUNTER — Telehealth: Payer: Self-pay | Admitting: Gastroenterology

## 2020-04-04 ENCOUNTER — Ambulatory Visit (INDEPENDENT_AMBULATORY_CARE_PROVIDER_SITE_OTHER): Payer: Medicare Other

## 2020-04-04 DIAGNOSIS — I5022 Chronic systolic (congestive) heart failure: Secondary | ICD-10-CM | POA: Diagnosis not present

## 2020-04-04 DIAGNOSIS — Z95810 Presence of automatic (implantable) cardiac defibrillator: Secondary | ICD-10-CM

## 2020-04-04 NOTE — Telephone Encounter (Signed)
The pt has called due to the cost of Creon.  He has been given the information regarding [employer] pt assistance.  He will call with any further questions

## 2020-04-05 NOTE — Progress Notes (Signed)
EPIC Encounter for ICM Monitoring  Patient Name: [name] is a 74 y.o. male Date: 04/05/2020 Primary Care Physican: Biagio Borg, MD Primary Cardiologist:Cooper Electrophysiologist:Camnitz Bi-V Pacing:96.5% 11/1/2021Office Weight:183lbs  Time in AT/AF  <0.1 hr/day (<0.1%)%)   Transmission reviewed.   Optivol thoracic impedancenormal.  Takes Spirolactone 25 mg take 0.5 tablet daily.  Recommendations: Green changes  Follow-up plan: ICM clinic phone appointment on12/17/2021. 91 day device clinic remote transmission 05/04/2020.   EP/Cardiology Office Visits:06/26/2020 with Dr Burt Knack.  2/22/2022with Dr.Camnitz.   Copy of ICM check sent to [facility].   3 month ICM trend: 04/04/2020    1 Year ICM trend:       [name], RN 04/05/2020 1:57 PM

## 2020-04-18 ENCOUNTER — Telehealth: Payer: Self-pay | Admitting: Cardiovascular Disease

## 2020-04-18 ENCOUNTER — Other Ambulatory Visit: Payer: Self-pay | Admitting: Rheumatology

## 2020-04-18 NOTE — Telephone Encounter (Signed)
Last Visit:02/03/2020 Next Visit:06/08/2020 UDS:11/24/2019 C/W treatment Narc Agreement:11/24/2019  Last Fill: 03/23/2020  Okay to refill Tramadol?

## 2020-04-18 NOTE — Telephone Encounter (Signed)
Patient has 5 days left, but is going out of town. Patient requests a call to be advised if medication is declined.

## 2020-04-18 NOTE — Telephone Encounter (Signed)
The patient is in the donut hole and is out of Eliquis. 2 weeks of samples placed at front desk for patient pick up. He was grateful for assistance.

## 2020-04-18 NOTE — Telephone Encounter (Signed)
[facility] is calling requesting to speak with Dr. Antionette Char nurse. Please advise.

## 2020-04-26 ENCOUNTER — Other Ambulatory Visit: Payer: Self-pay | Admitting: Cardiology

## 2020-04-26 DIAGNOSIS — R972 Elevated prostate specific antigen [PSA]: Secondary | ICD-10-CM | POA: Diagnosis not present

## 2020-04-26 NOTE — Telephone Encounter (Signed)
Prescription refill request for Eliquis received.  Last office visit: 01/11/2020, Camnitz Scr: 0.85, 03/20/2020 Age: 74 yo Weight: 83.2 kg   Prescription refill sent.

## 2020-05-04 ENCOUNTER — Ambulatory Visit (INDEPENDENT_AMBULATORY_CARE_PROVIDER_SITE_OTHER): Payer: Medicare Other

## 2020-05-04 DIAGNOSIS — I420 Dilated cardiomyopathy: Secondary | ICD-10-CM

## 2020-05-04 DIAGNOSIS — I5022 Chronic systolic (congestive) heart failure: Secondary | ICD-10-CM

## 2020-05-05 LAB — CUP PACEART REMOTE DEVICE CHECK
Battery Remaining Longevity: 45 mo
Battery Voltage: 2.96 V
Brady Statistic AP VP Percent: 0.5 %
Brady Statistic AP VS Percent: 0.01 %
Brady Statistic AS VP Percent: 97.92 %
Brady Statistic AS VS Percent: 1.56 %
Brady Statistic RA Percent Paced: 0.51 %
Brady Statistic RV Percent Paced: 36.94 %
Date Time Interrogation Session: 20211217105813
HighPow Impedance: 67 Ohm
Implantable Lead Implant Date: 20181108
Implantable Lead Implant Date: 20181108
Implantable Lead Implant Date: 20181108
Implantable Lead Location: 753858
Implantable Lead Location: 753859
Implantable Lead Location: 753860
Implantable Lead Model: 4298
Implantable Lead Model: 5076
Implantable Pulse Generator Implant Date: 20181108
Lead Channel Impedance Value: 1064 Ohm
Lead Channel Impedance Value: 1083 Ohm
Lead Channel Impedance Value: 1140 Ohm
Lead Channel Impedance Value: 204.14 Ohm
Lead Channel Impedance Value: 216.848
Lead Channel Impedance Value: 263.855
Lead Channel Impedance Value: 272.032
Lead Channel Impedance Value: 295.076
Lead Channel Impedance Value: 399 Ohm
Lead Channel Impedance Value: 418 Ohm
Lead Channel Impedance Value: 456 Ohm
Lead Channel Impedance Value: 475 Ohm
Lead Channel Impedance Value: 551 Ohm
Lead Channel Impedance Value: 589 Ohm
Lead Channel Impedance Value: 646 Ohm
Lead Channel Impedance Value: 703 Ohm
Lead Channel Impedance Value: 760 Ohm
Lead Channel Impedance Value: 779 Ohm
Lead Channel Pacing Threshold Amplitude: 0.375 V
Lead Channel Pacing Threshold Amplitude: 0.5 V
Lead Channel Pacing Threshold Amplitude: 2.25 V
Lead Channel Pacing Threshold Pulse Width: 0.4 ms
Lead Channel Pacing Threshold Pulse Width: 0.4 ms
Lead Channel Pacing Threshold Pulse Width: 1 ms
Lead Channel Sensing Intrinsic Amplitude: 11.75 mV
Lead Channel Sensing Intrinsic Amplitude: 11.75 mV
Lead Channel Sensing Intrinsic Amplitude: 3.125 mV
Lead Channel Sensing Intrinsic Amplitude: 3.125 mV
Lead Channel Setting Pacing Amplitude: 2 V
Lead Channel Setting Pacing Amplitude: 2.5 V
Lead Channel Setting Pacing Amplitude: 2.75 V
Lead Channel Setting Pacing Pulse Width: 0.4 ms
Lead Channel Setting Pacing Pulse Width: 1 ms
Lead Channel Setting Sensing Sensitivity: 0.3 mV

## 2020-05-09 ENCOUNTER — Ambulatory Visit (INDEPENDENT_AMBULATORY_CARE_PROVIDER_SITE_OTHER): Payer: Medicare Other

## 2020-05-09 DIAGNOSIS — Z95810 Presence of automatic (implantable) cardiac defibrillator: Secondary | ICD-10-CM

## 2020-05-09 DIAGNOSIS — I5022 Chronic systolic (congestive) heart failure: Secondary | ICD-10-CM | POA: Diagnosis not present

## 2020-05-09 NOTE — Progress Notes (Signed)
EPIC Encounter for ICM Monitoring  Patient Name: [name] is a 74 y.o. male Date: 05/09/2020 Primary Care Physican: Biagio Borg, MD Primary Cardiologist:Cooper Electrophysiologist:Camnitz Bi-V Pacing:97.9% 11/1/2021Office Weight:183lbs  Time in AT/AF    0.0 hr/day (0.0%)   Transmission reviewed.  Optivol thoracic impedancenormal.  Takes Spirolactone 25 mg take 0.5 tablet daily.  Recommendations: No changes  Follow-up plan: ICM clinic phone appointment on2/06/2020. 91 day device clinic remote transmission3/17/2022.   EP/Cardiology Office Visits:06/26/2020 with Dr Burt Knack.  2/22/2022with Dr.Camnitz.   Copy of ICM check sent to [facility].   3 month ICM trend: 05/05/2020    1 Year ICM trend:       [name], RN 05/09/2020 4:11 PM

## 2020-05-18 NOTE — Progress Notes (Signed)
Remote ICD transmission.   

## 2020-05-22 ENCOUNTER — Other Ambulatory Visit: Payer: Self-pay | Admitting: Cardiology

## 2020-05-22 ENCOUNTER — Other Ambulatory Visit: Payer: Self-pay | Admitting: Rheumatology

## 2020-05-22 ENCOUNTER — Other Ambulatory Visit: Payer: Self-pay | Admitting: *Deleted

## 2020-05-22 DIAGNOSIS — Z5181 Encounter for therapeutic drug level monitoring: Secondary | ICD-10-CM | POA: Diagnosis not present

## 2020-05-22 DIAGNOSIS — Z79899 Other long term (current) drug therapy: Secondary | ICD-10-CM

## 2020-05-23 NOTE — Telephone Encounter (Signed)
Last Visit: 02/03/2020 Next Visit: 06/08/2020 UDS:05/22/2020 (pending) Narc Agreement: 11/24/2019  Last Fill: 04/18/2020 # 30   Okay to refill Tramadol?

## 2020-05-23 NOTE — Telephone Encounter (Signed)
Prescription refill request for Eliquis received. GY:IRSW Last office visit: Camnitz, 01/11/2020 Scr: 0.89, 05/22/2020 Age: 75 yo Weight: 83.2 kg    Prescription refill sent.

## 2020-05-23 NOTE — Progress Notes (Signed)
CBC is normal, glucose is mildly elevated probably not a fasting sample.

## 2020-05-24 ENCOUNTER — Other Ambulatory Visit: Payer: Self-pay | Admitting: Cardiovascular Disease

## 2020-05-24 LAB — COMPLETE METABOLIC PANEL WITH GFR
AG Ratio: 1.9 (calc) (ref 1.0–2.5)
ALT: 19 U/L (ref 9–46)
AST: 18 U/L (ref 10–35)
Albumin: 4.3 g/dL (ref 3.6–5.1)
Alkaline phosphatase (APISO): 60 U/L (ref 35–144)
BUN: 22 mg/dL (ref 7–25)
CO2: 28 mmol/L (ref 20–32)
Calcium: 9.6 mg/dL (ref 8.6–10.3)
Chloride: 106 mmol/L (ref 98–110)
Creat: 0.89 mg/dL (ref 0.70–1.18)
GFR, Est African American: 96 mL/min/{1.73_m2} (ref >=60)
GFR, Est Non African American: 83 mL/min/{1.73_m2} (ref >=60)
Globulin: 2.3 g/dL (calc) (ref 1.9–3.7)
Glucose, Bld: 116 mg/dL — ABNORMAL HIGH (ref 65–99)
Potassium: 4.4 mmol/L (ref 3.5–5.3)
Sodium: 139 mmol/L (ref 135–146)
Total Bilirubin: 1 mg/dL (ref 0.2–1.2)
Total Protein: 6.6 g/dL (ref 6.1–8.1)

## 2020-05-24 LAB — DRUG MONITOR, TRAMADOL,QN, URINE
Desmethyltramadol: 3825 ng/mL — ABNORMAL HIGH (ref <100)
Tramadol: 4735 ng/mL — ABNORMAL HIGH (ref <100)

## 2020-05-24 LAB — CBC WITH DIFFERENTIAL/PLATELET
Absolute Monocytes: 682 cells/uL (ref 200–950)
Basophils Absolute: 62 cells/uL (ref 0–200)
Basophils Relative: 1 %
Eosinophils Absolute: 161 cells/uL (ref 15–500)
Eosinophils Relative: 2.6 %
HCT: 42.7 % (ref 38.5–50.0)
Hemoglobin: 14.5 g/dL (ref 13.2–17.1)
Lymphs Abs: 1358 cells/uL (ref 850–3900)
MCH: 30.7 pg (ref 27.0–33.0)
MCHC: 34 g/dL (ref 32.0–36.0)
MCV: 90.5 fL (ref 80.0–100.0)
MPV: 10.2 fL (ref 7.5–12.5)
Monocytes Relative: 11 %
Neutro Abs: 3937 cells/uL (ref 1500–7800)
Neutrophils Relative %: 63.5 %
Platelets: 242 10*3/uL (ref 140–400)
RBC: 4.72 10*6/uL (ref 4.20–5.80)
RDW: 12.7 % (ref 11.0–15.0)
Total Lymphocyte: 21.9 %
WBC: 6.2 10*3/uL (ref 3.8–10.8)

## 2020-05-24 LAB — DRUG MONITOR, PANEL 5, W/CONF, URINE
Amphetamines: NEGATIVE ng/mL (ref <500)
Barbiturates: NEGATIVE ng/mL (ref <300)
Benzodiazepines: NEGATIVE ng/mL (ref <100)
Cocaine Metabolite: NEGATIVE ng/mL (ref <150)
Creatinine: 265.4 mg/dL
Marijuana Metabolite: NEGATIVE ng/mL (ref <20)
Methadone Metabolite: NEGATIVE ng/mL (ref <100)
Opiates: NEGATIVE ng/mL (ref <100)
Oxidant: NEGATIVE ug/mL
Oxycodone: NEGATIVE ng/mL (ref <100)
pH: 5.2 (ref 4.5–9.0)

## 2020-05-24 LAB — DM TEMPLATE

## 2020-05-25 NOTE — Progress Notes (Signed)
Office Visit Note  Patient: [name]             Date of Birth: [birth year]           MRN: [id number]             PCP: Biagio Borg, MD Referring: Biagio Borg, MD Visit Date: 06/08/2020 Occupation: @GUAROCC @  Subjective:  Pain in multiple joints.   History of Present Illness: [name] is a 75 y.o. male with history of rheumatoid arthritis and osteoarthritis.  He states he continues to have pain and discomfort in multiple joints.  He complains of nocturnal pain in his knee joints.  He has significant pain and stiffness in his bilateral hands.  He states he is unable to sleep at night without tramadol due to severe pain.  His right knee joint is replaced which continues to hurt.  He denies any joint swelling.  He has been taking hydroxychloroquine 1 tablet a day.  He states that has been getting eye examination every 6 months.  Activities of Daily Living:  Patient reports morning stiffness for all day.   Patient Reports nocturnal pain.  Difficulty dressing/grooming: Denies Difficulty climbing stairs: Reports Difficulty getting out of chair: Reports Difficulty using hands for taps, buttons, cutlery, and/or writing: Reports  Review of Systems  Constitutional: Positive for fatigue.  HENT: Positive for mouth dryness and nose dryness. Negative for mouth sores.   Eyes: Positive for pain, itching and dryness.  Respiratory: Positive for shortness of breath and difficulty breathing.   Cardiovascular: Positive for chest pain and palpitations.  Gastrointestinal: Positive for diarrhea. Negative for blood in stool and constipation.  Endocrine: Negative for increased urination.  Genitourinary: Negative for difficulty urinating.  Musculoskeletal: Positive for arthralgias, joint pain, joint swelling and morning stiffness. Negative for myalgias, muscle tenderness and myalgias.  Skin: Negative for color change, rash and redness.  Allergic/Immunologic: Negative for susceptible to  infections.  Neurological: Negative for dizziness, numbness, headaches and memory loss.  Hematological: Negative for bruising/bleeding tendency.  Psychiatric/Behavioral: Negative for confusion.    PMFS History:  Patient Active Problem List   Diagnosis Date Noted  . Fever 01/18/2020  . Pericarditis 07/06/2019  . Right hip pain 07/06/2019  . Acute chest pain 01/24/2019  . Prediabetes 12/24/2018  . Rheumatoid arthritis ([place]) 12/24/2018  . Preventative health care 06/25/2018  . Incarcerated ventral hernia 03/20/2018  . DCM (dilated cardiomyopathy) ([place]) 02/11/2017  . Chronic systolic heart failure ([place]) 01/15/2017  . History of pneumonia 10/23/2016  . Osteoarthritis 10/23/2016  . Primary osteoarthritis of both feet 10/04/2016  . Primary osteoarthritis of left knee 10/04/2016  . DDD (degenerative disc disease), lumbar 10/04/2016  . Hemoptysis 05/27/2016  . Overweight (BMI 25.0-29.9) 03/14/2016  . 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  . Parotid mass 08/23/2015  . Dyspnea 11/29/2014  . Synovial cyst of lumbar facet joint 09/26/2014  . Incisional hernia, without obstruction or gangrene 12/06/2013  . Nausea alone 08/27/2013  . Colon cancer screening 08/27/2013  . 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 Benign prostatic hyperplasia with urinary obstruction 06/18/2010  . Other specified disorder of stomach and duodenum 12/16/2008  . 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 GERD 09/02/2008  . COLONIC POLYPS, HYPERPLASTIC 05/24/2007  . Obstructive sleep apnea 05/24/2007  . ALLERGIC RHINITIS 05/24/2007  . ESOPHAGEAL STRICTURE 05/24/2007  . HIATAL HERNIA 05/24/2007  . Hyperlipidemia 02/06/2007  . Essential hypertension 02/06/2007  . CAD (coronary artery disease) 02/06/2007  . Primary osteoarthritis  of both hands 02/06/2007  . Neck pain on right side 02/06/2007    Past Medical History:  Diagnosis Date  . AICD (automatic cardioverter/defibrillator) present   . Allergic  rhinitis   . Allergy   . Anginal pain ([place]) 05/26/14   chest pain after chasing dog  . Atypical nevus of back 04/27/2003   moderate - mid lower back  . Basal cell carcinoma 01/26/2008   right cheek - MOHs  . CAD (coronary artery disease)    hx of stent- 2005 RCA  . Cataract    beginning stage both eyes  . CHF (congestive heart failure) (HCC)    pacemaker [employer]onic    . Chronic back pain    intermittent  . Constipation   . Dizziness   . Dysrhythmia    right bundle branch block   . Esophageal stricture   . GERD (gastroesophageal reflux disease)   . Hemoptysis   . Hiatal hernia   . History of colonic polyps    hyperplastic  . HTN (hypertension)   . Hyperlipidemia   . Hypertrophy of prostate with urinary obstruction and other lower urinary tract symptoms (LUTS)   . OA (osteoarthritis)   . OSA (obstructive sleep apnea)    cpap- 10   . 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 Pericarditis 07/06/2019  . Pneumonia   . Pre-diabetes    no medications  . Shortness of breath    with exertion  . Sleep apnea    cpap  . Testicular hypofunction     Family History  Problem Relation Age of Onset  . Coronary artery disease Father   . Diabetes Father   . Sudden death Father        due to heart disease  . Heart disease Father   . Heart disease Mother   . Hypertension Other   . Stomach cancer Paternal Grandmother   . COPD Brother   . Arthritis Brother   . Heart failure Brother   . Cancer Brother        lung   . Arthritis Daughter   . Colon cancer Neg Hx   . Esophageal cancer Neg Hx   . Colon polyps Neg Hx   . Ulcerative colitis Neg Hx    Past Surgical History:  Procedure Laterality Date  . BIV ICD INSERTION CRT-D N/A 03/27/2017   Procedure: BIV ICD INSERTION CRT-D;  Surgeon: Constance Haw, MD;  Location: [place] [facility];  Service: Cardiovascular;  Laterality: N/A;  . CARDIAC  CATHETERIZATION     '05, last 2009, showing patent RCA stent  . COLONOSCOPY  12/2007   HYPERPLASTIC POLYP  . COLONOSCOPY  12/2019  . COLONOSCOPY WITH PROPOFOL N/A 06/02/2014   Procedure: COLONOSCOPY WITH PROPOFOL;  Surgeon: [name], MD;  Location: [facility];  Service: Endoscopy;  Laterality: N/A;  . CORONARY ANGIOPLASTY  08/2003  . ESOPHAGOGASTRODUODENOSCOPY (EGD) WITH PROPOFOL N/A 06/02/2014   Procedure: ESOPHAGOGASTRODUODENOSCOPY (EGD) WITH PROPOFOL;  Surgeon: [name], MD;  Location: [facility];  Service: Endoscopy;  Laterality: N/A;  . hernia surgery x 3     Bilateral Inguinal, Umbicial  . INSERTION OF MESH N/A 03/20/2018   Procedure: INSERTION OF MESH;  Surgeon: Alphonsa Overall, MD;  Location: [place];  Service: General;  Laterality: N/A;  . IRRIGATION AND DEBRIDEMENT ABSCESS Left 08/14/2012   Procedure: IRRIGATION AND DEBRIDEMENT LEFT INGUINAL BOIL ;  Surgeon:  Ailene Rud, MD;  Location: [facility];  Service: Urology;  Laterality: Left;  . JOINT REPLACEMENT Right 2017  . KNEE ARTHROPLASTY    . LEFT HEART CATH AND CORONARY ANGIOGRAPHY N/A 01/24/2019   Procedure: LEFT HEART CATH AND CORONARY ANGIOGRAPHY;  Surgeon: Martinique, Peter M, MD;  Location: [place] [facility];  Service: Cardiovascular;  Laterality: N/A;  . LEFT HEART CATHETERIZATION WITH CORONARY ANGIOGRAM N/A 05/26/2014   Procedure: LEFT HEART CATHETERIZATION WITH CORONARY ANGIOGRAM;  Surgeon: Blane Ohara, MD;  Location: [facility];  Service: Cardiovascular;  Laterality: N/A;  . LUMBAR LAMINECTOMY/DECOMPRESSION MICRODISCECTOMY Left 09/26/2014   Procedure: Lumbar Laminectomy for resection of synovial cyst  Lumbar five- sacral one left;  Surgeon: Kary Kos, MD;  Location: [place] [facility];  Service: Neurosurgery;  Laterality: Left;  . MOLE REMOVAL Left 03/20/2018   Procedure: MOLE REMOVAL;  Surgeon: Alphonsa Overall, MD;  Location: [place];  Service: General;  Laterality: Left;  . Oral surg to removed growth from ?sinus  10/2010    ?Dermoid removed by DrRiggs  . PACEMAKER INSERTION  04/03/2017  . POLYPECTOMY    . RCA stenting     '05 RCA  . right toe surgery  Right    Cyst   . RIGHT/LEFT HEART CATH AND CORONARY ANGIOGRAPHY N/A 01/15/2017   Procedure: RIGHT/LEFT HEART CATH AND CORONARY ANGIOGRAPHY;  Surgeon: [name], MD;  Location: [place] [facility];  Service: Cardiovascular;  Laterality: N/A;  . s/p right knee arthroscopy  2005  . thumb surgery Right   . TOTAL KNEE ARTHROPLASTY Right 03/12/2016   Procedure: RIGHT TOTAL KNEE ARTHROPLASTY;  Surgeon: Paralee Cancel, MD;  Location: [facility];  Service: Orthopedics;  Laterality: Right;  . UPPER GASTROINTESTINAL ENDOSCOPY    . VENTRAL HERNIA REPAIR N/A 03/20/2018   Procedure: LAPAROSCOPIC VENTRAL INCISIONAL  HERNIA ERAS PATHWAY;  Surgeon: Alphonsa Overall, MD;  Location: [place];  Service: General;  Laterality: N/A;   Social History   Social History Narrative  . Not on file   Immunization History  Administered Date(s) Administered  . Fluad Quad(high Dose 65+) 02/18/2019  . Influenza Split 03/21/2011  . Influenza Whole 04/06/2009, 04/09/2010  . Influenza, High Dose Seasonal PF 04/20/2015, 04/15/2016, 02/21/2017  . Influenza,inj,Quad PF,6+ Mos 03/24/2014  . Influenza-Unspecified 02/26/2018, 02/18/2019, 02/26/2020  . PFIZER(Purple Top)SARS-COV-2 Vaccination 05/27/2019, 06/17/2019, 02/15/2020  . Pneumococcal Conjugate-13 11/29/2014  . Pneumococcal Polysaccharide-23 06/25/2018  . Tdap 11/29/2014  . Zoster 05/20/2006  . Zoster Recombinat (Shingrix) 06/07/2018, 09/26/2018     Objective: Vital Signs: BP (!) 143/77 (BP Location: Left Arm, Patient Position: Sitting, Cuff Size: Normal)   Pulse 78   Resp 16   Ht 5\' 8"  (1.727 m)   Wt 194 lb (88 kg)   BMI 29.50 kg/m    Physical Exam Vitals and nursing note reviewed.  Constitutional:      Appearance: He is well-developed and well-nourished.  HENT:     Head: Normocephalic and atraumatic.  Eyes:     Extraocular  Movements: EOM normal.     Conjunctiva/sclera: Conjunctivae normal.     Pupils: Pupils are equal, round, and reactive to light.  Cardiovascular:     Rate and Rhythm: Normal rate and regular rhythm.     Heart sounds: Normal heart sounds.  Pulmonary:     Effort: Pulmonary effort is normal.     Breath sounds: Normal breath sounds.  Abdominal:     General: Bowel sounds are normal.     Palpations: Abdomen is  soft.  Musculoskeletal:     Cervical back: Normal range of motion and neck supple.  Skin:    General: Skin is warm and dry.     Capillary Refill: Capillary refill takes less than 2 seconds.  Neurological:     Mental Status: He is alert and oriented to person, place, and time.  Psychiatric:        Mood and Affect: Mood and affect normal.        Behavior: Behavior normal.      Musculoskeletal Exam: C-spine was in good range of motion.  Shoulder joints, elbow joints, wrist joints with good range of motion.  He has synovial thickening over MCP joints but no synovitis was noted.  He has bilateral CMC thickening and subluxation.  PIP and DIP thickening was noted.  Hip joints with good range of motion.  Right knee joint is replaced and was in good range of motion.  He discomfort range of motion of bilateral knee joints.  There was no tenderness over ankles or MTPs. CDAI Exam: CDAI Score: 1.8  Patient Global: 5 mm; Provider Global: 3 mm Swollen: 0 ; Tender: 1  Joint Exam 06/08/2020      Right  Left  Knee   Tender        Investigation: No additional findings.  Imaging: No results found.  Recent Labs: Lab Results  Component Value Date   WBC 6.2 05/22/2020   HGB 14.5 05/22/2020   PLT 242 05/22/2020   NA 139 05/22/2020   K 4.4 05/22/2020   CL 106 05/22/2020   CO2 28 05/22/2020   GLUCOSE 116 (H) 05/22/2020   BUN 22 05/22/2020   CREATININE 0.89 05/22/2020   BILITOT 1.0 05/22/2020   ALKPHOS 67 03/20/2020   AST 18 05/22/2020   ALT 19 05/22/2020   PROT 6.6 05/22/2020    ALBUMIN 4.5 03/20/2020   CALCIUM 9.6 05/22/2020   GFRAA 96 05/22/2020    Speciality Comments: PLQ eye exam: 02/24/2019 normal. Dr. Ophelia Charter. Follow up in 1 year.  Procedures:  No procedures performed Allergies: Sulfonamide derivatives   Assessment / Plan:     Visit Diagnoses: Rheumatoid arthritis involving multiple sites with positive rheumatoid factor (HCC) - CCP +, RF -, 14-3-3 eta negative, Sed rate WNL.  He complains of pain and discomfort in multiple joints.  There was no synovitis on examination.  He has synovial thickening over MCPs.  High risk medication use - PLQ 200 mg 1 tablet po qd. eye exam: 02/24/2019.  Patient states he has been getting eye examination every 6 months.  He will get the eye examination report forwarded to [place].  Medication monitoring encounter -he has been on tramadol 50mg  1 tablet by mouth at bedtime as needed. UDS: 05/22/2020, narcotic agreement: 11/24/2019.  Narcotic agreement was renewed today.  He is unable to sleep without tramadol at nighttime.  He states he is avoiding Tylenol  as he was told that he may have cirrhosis of liver.  Primary osteoarthritis of both hands-he has severe osteoarthritis in his bilateral hands.  Joint protection muscle strengthening was discussed.  Primary osteoarthritis of left knee-he has discomfort in his left knee joint.  No warmth swelling or effusion was noted.  Status post right knee replacement-he had right knee replacement with Dr. 01/25/2020.  He states he continues to have discomfort in his right knee.  Primary osteoarthritis of both feet-proper fitting shoes were discussed.  DDD (degenerative disc disease), lumbar-core strengthening and weight loss was discussed.  He continues to have discomfort in the lower back.  The medical problems are listed as follows:  History of coronary artery disease - followed by cardiologist.  History of hyperlipidemia  Pacemaker  History of CHF (congestive heart failure)  History  of hypertension  History of gastric polyp  History of colon polyps  History of gastroesophageal reflux (GERD)  History of sleep apnea  History of BPH  Orders: No orders of the defined types were placed in this encounter.  Meds ordered this encounter  Medications  . hydroxychloroquine (PLAQUENIL) 200 MG tablet    Sig: Take 1 tablet (200 mg total) by mouth daily.    Dispense:  90 tablet    Refill:  0    M19.90 Inflammatory arthritis    Follow-Up Instructions: Return in about 5 months (around 11/06/2020) for Rheumatoid arthritis.   Bo Merino, MD  Note - This record has been created using Editor, commissioning.  Chart creation errors have been sought, but may not always  have been located. Such creation errors do not reflect on  the standard of medical care.

## 2020-05-30 ENCOUNTER — Other Ambulatory Visit: Payer: Self-pay | Admitting: Cardiovascular Disease

## 2020-06-08 ENCOUNTER — Encounter: Payer: Self-pay | Admitting: Rheumatology

## 2020-06-08 ENCOUNTER — Other Ambulatory Visit: Payer: Self-pay

## 2020-06-08 ENCOUNTER — Ambulatory Visit (INDEPENDENT_AMBULATORY_CARE_PROVIDER_SITE_OTHER): Payer: Medicare Other | Admitting: Rheumatology

## 2020-06-08 VITALS — BP 143/77 | HR 78 | Resp 16 | Ht 68.0 in | Wt 194.0 lb

## 2020-06-08 DIAGNOSIS — Z8719 Personal history of other diseases of the digestive system: Secondary | ICD-10-CM

## 2020-06-08 DIAGNOSIS — M19071 Primary osteoarthritis, right ankle and foot: Secondary | ICD-10-CM | POA: Diagnosis not present

## 2020-06-08 DIAGNOSIS — Z87438 Personal history of other diseases of male genital organs: Secondary | ICD-10-CM

## 2020-06-08 DIAGNOSIS — Z5181 Encounter for therapeutic drug level monitoring: Secondary | ICD-10-CM | POA: Diagnosis not present

## 2020-06-08 DIAGNOSIS — Z79899 Other long term (current) drug therapy: Secondary | ICD-10-CM | POA: Diagnosis not present

## 2020-06-08 DIAGNOSIS — M19072 Primary osteoarthritis, left ankle and foot: Secondary | ICD-10-CM

## 2020-06-08 DIAGNOSIS — Z8679 Personal history of other diseases of the circulatory system: Secondary | ICD-10-CM

## 2020-06-08 DIAGNOSIS — M1712 Unilateral primary osteoarthritis, left knee: Secondary | ICD-10-CM | POA: Diagnosis not present

## 2020-06-08 DIAGNOSIS — M0579 Rheumatoid arthritis with rheumatoid factor of multiple sites without organ or systems involvement: Secondary | ICD-10-CM

## 2020-06-08 DIAGNOSIS — Z8639 Personal history of other endocrine, nutritional and metabolic disease: Secondary | ICD-10-CM

## 2020-06-08 DIAGNOSIS — M19041 Primary osteoarthritis, right hand: Secondary | ICD-10-CM

## 2020-06-08 DIAGNOSIS — Z96651 Presence of right artificial knee joint: Secondary | ICD-10-CM

## 2020-06-08 DIAGNOSIS — M5136 Other intervertebral disc degeneration, lumbar region: Secondary | ICD-10-CM | POA: Diagnosis not present

## 2020-06-08 DIAGNOSIS — Z86010 Personal history of colonic polyps: Secondary | ICD-10-CM

## 2020-06-08 DIAGNOSIS — M1990 Unspecified osteoarthritis, unspecified site: Secondary | ICD-10-CM

## 2020-06-08 DIAGNOSIS — Z950 Presence of cardiac pacemaker: Secondary | ICD-10-CM | POA: Diagnosis not present

## 2020-06-08 DIAGNOSIS — M19042 Primary osteoarthritis, left hand: Secondary | ICD-10-CM

## 2020-06-08 DIAGNOSIS — Z8669 Personal history of other diseases of the nervous system and sense organs: Secondary | ICD-10-CM

## 2020-06-08 MED ORDER — HYDROXYCHLOROQUINE SULFATE 200 MG PO TABS: 200.0000 mg | ORAL_TABLET | Freq: Every day | ORAL | 0 refills | Status: DC

## 2020-06-19 ENCOUNTER — Other Ambulatory Visit: Payer: Self-pay

## 2020-06-19 ENCOUNTER — Telehealth: Payer: Self-pay

## 2020-06-19 NOTE — Telephone Encounter (Signed)
Pt states he is out of town and will not be able to send his remote for [place] on 06/21/2020. He asked me to reschedule for the end of next week. I rescheduled him for 06-30-2020. I told him I will let Margarita Grizzle know.

## 2020-06-20 ENCOUNTER — Encounter: Payer: Self-pay | Admitting: Internal Medicine

## 2020-06-20 ENCOUNTER — Ambulatory Visit (INDEPENDENT_AMBULATORY_CARE_PROVIDER_SITE_OTHER): Payer: Medicare Other | Admitting: Internal Medicine

## 2020-06-20 VITALS — BP 120/76 | HR 78 | Temp 98.0°F | Ht 68.0 in | Wt 192.0 lb

## 2020-06-20 DIAGNOSIS — I2510 Atherosclerotic heart disease of native coronary artery without angina pectoris: Secondary | ICD-10-CM | POA: Diagnosis not present

## 2020-06-20 DIAGNOSIS — I10 Essential (primary) hypertension: Secondary | ICD-10-CM | POA: Diagnosis not present

## 2020-06-20 DIAGNOSIS — K529 Noninfective gastroenteritis and colitis, unspecified: Secondary | ICD-10-CM | POA: Diagnosis not present

## 2020-06-20 DIAGNOSIS — L02419 Cutaneous abscess of limb, unspecified: Secondary | ICD-10-CM | POA: Diagnosis not present

## 2020-06-20 MED ORDER — DOXYCYCLINE HYCLATE 100 MG PO TABS: 100.0000 mg | ORAL_TABLET | Freq: Two times a day (BID) | ORAL | 0 refills | Status: DC

## 2020-06-20 NOTE — Progress Notes (Signed)
Established Patient Office Visit  Subjective:  Patient ID: [name], male    DOB: Jan 03, 1946  Age: 75 y.o. MRN: [id number]  CC:  Chief Complaint  Patient presents with  . Cyst    Under right arm     HPI [name] presents for c/o tender red focal swelling to right axilla suddenly acutely worsening over the past 3 days, but denies fever, chills, drainage or other tender spots.  Pt denies chest pain, increased sob or doe, wheezing, orthopnea, PND, increased LE swelling, palpitations, dizziness or syncope.   Pt denies polydipsia, polyuria, Denies worsening reflux, abd pain, dysphagia, n/v, bowel change or blood except for ongoing chronic diarrhea of uncertain cause but s/p extensive recent GI evaluation, but pt wary of taking creon b/c just not sure why he need to take it.    Past Medical History:  Diagnosis Date  . AICD (automatic cardioverter/defibrillator) present   . Allergic rhinitis   . Allergy   . Anginal pain ([place]) 05/26/14   chest pain after chasing dog  . Atypical nevus of back 04/27/2003   moderate - mid lower back  . Basal cell carcinoma 01/26/2008   right cheek - MOHs  . CAD (coronary artery disease)    hx of stent- 2005 RCA  . Cataract    beginning stage both eyes  . CHF (congestive heart failure) (HCC)    pacemaker Medtronic    . Chronic back pain    intermittent  . Constipation   . Dizziness   . Dysrhythmia    right bundle branch block   . Esophageal stricture   . GERD (gastroesophageal reflux disease)   . Hemoptysis   . Hiatal hernia   . History of colonic polyps    hyperplastic  . HTN (hypertension)   . Hyperlipidemia   . Hypertrophy of prostate with urinary obstruction and other lower urinary tract symptoms (LUTS)   . OA (osteoarthritis)   . OSA (obstructive sleep apnea)    cpap- 10   . 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 Pericarditis 07/06/2019  .  Pneumonia   . Pre-diabetes    no medications  . Shortness of breath    with exertion  . Sleep apnea    cpap  . Testicular hypofunction     Past Surgical History:  Procedure Laterality Date  . BIV ICD INSERTION CRT-D N/A 03/27/2017   Procedure: BIV ICD INSERTION CRT-D;  Surgeon: Constance Haw, MD;  Location: [place] [facility];  Service: Cardiovascular;  Laterality: N/A;  . CARDIAC CATHETERIZATION     '05, last 2009, showing patent RCA stent  . COLONOSCOPY  12/2007   HYPERPLASTIC POLYP  . COLONOSCOPY  12/2019  . COLONOSCOPY WITH PROPOFOL N/A 06/02/2014   Procedure: COLONOSCOPY WITH PROPOFOL;  Surgeon: [name], MD;  Location: [facility];  Service: Endoscopy;  Laterality: N/A;  . CORONARY ANGIOPLASTY  08/2003  . ESOPHAGOGASTRODUODENOSCOPY (EGD) WITH PROPOFOL N/A 06/02/2014   Procedure: ESOPHAGOGASTRODUODENOSCOPY (EGD) WITH PROPOFOL;  Surgeon: [name], MD;  Location: [facility];  Service: Endoscopy;  Laterality: N/A;  . hernia surgery x 3     Bilateral Inguinal, Umbicial  . INSERTION OF MESH N/A 03/20/2018   Procedure: INSERTION OF MESH;  Surgeon: Alphonsa Overall, MD;  Location: [place];  Service: General;  Laterality: N/A;  . IRRIGATION AND DEBRIDEMENT ABSCESS Left 08/14/2012   Procedure: IRRIGATION AND  DEBRIDEMENT LEFT INGUINAL BOIL ;  Surgeon: Ailene Rud, MD;  Location: [facility];  Service: Urology;  Laterality: Left;  . JOINT REPLACEMENT Right 2017  . KNEE ARTHROPLASTY    . LEFT HEART CATH AND CORONARY ANGIOGRAPHY N/A 01/24/2019   Procedure: LEFT HEART CATH AND CORONARY ANGIOGRAPHY;  Surgeon: Martinique, Peter M, MD;  Location: [place] [facility];  Service: Cardiovascular;  Laterality: N/A;  . LEFT HEART CATHETERIZATION WITH CORONARY ANGIOGRAM N/A 05/26/2014   Procedure: LEFT HEART CATHETERIZATION WITH CORONARY ANGIOGRAM;  Surgeon: Blane Ohara, MD;  Location: [facility];  Service: Cardiovascular;  Laterality: N/A;  . LUMBAR LAMINECTOMY/DECOMPRESSION  MICRODISCECTOMY Left 09/26/2014   Procedure: Lumbar Laminectomy for resection of synovial cyst  Lumbar five- sacral one left;  Surgeon: Kary Kos, MD;  Location: [place] [facility];  Service: Neurosurgery;  Laterality: Left;  . MOLE REMOVAL Left 03/20/2018   Procedure: MOLE REMOVAL;  Surgeon: Alphonsa Overall, MD;  Location: [place];  Service: General;  Laterality: Left;  . Oral surg to removed growth from ?sinus  10/2010   ?Dermoid removed by DrRiggs  . PACEMAKER INSERTION  04/03/2017  . POLYPECTOMY    . RCA stenting     '05 RCA  . right toe surgery  Right    Cyst   . RIGHT/LEFT HEART CATH AND CORONARY ANGIOGRAPHY N/A 01/15/2017   Procedure: RIGHT/LEFT HEART CATH AND CORONARY ANGIOGRAPHY;  Surgeon: [name], MD;  Location: [place] [facility];  Service: Cardiovascular;  Laterality: N/A;  . s/p right knee arthroscopy  2005  . thumb surgery Right   . TOTAL KNEE ARTHROPLASTY Right 03/12/2016   Procedure: RIGHT TOTAL KNEE ARTHROPLASTY;  Surgeon: Paralee Cancel, MD;  Location: [facility];  Service: Orthopedics;  Laterality: Right;  . UPPER GASTROINTESTINAL ENDOSCOPY    . VENTRAL HERNIA REPAIR N/A 03/20/2018   Procedure: [place];  Surgeon: Alphonsa Overall, MD;  Location: [place];  Service: General;  Laterality: N/A;    Family History  Problem Relation Age of Onset  . Coronary artery disease Father   . Diabetes Father   . Sudden death Father        due to heart disease  . Heart disease Father   . Heart disease Mother   . Hypertension Other   . Stomach cancer Paternal Grandmother   . COPD Brother   . Arthritis Brother   . Heart failure Brother   . Cancer Brother        lung   . Arthritis Daughter   . Colon cancer Neg Hx   . Esophageal cancer Neg Hx   . Colon polyps Neg Hx   . Ulcerative colitis Neg Hx     Social History   Socioeconomic History  . Marital status: Married    Spouse name: Not on file  . Number of children: 1  . Years of education: Not  on file  . Highest education level: Not on file  Occupational History  . Occupation: Lobbyist: [employer]  Tobacco Use  . Smoking status: Former Smoker    Quit date: 05/20/1990    Years since quitting: 30.1  . Smokeless tobacco: Never Used  . Tobacco comment: previous 30 pack year history  Vaping Use  . Vaping Use: Never used  Substance and Sexual Activity  . Alcohol use: Yes    Comment: rare  . Drug use: Never  . Sexual activity: Not on file  Other  Topics Concern  . Not on file  Social History Narrative  . Not on file   Social Determinants of Health   Financial Resource Strain: Not on file  Food Insecurity: Not on file  Transportation Needs: Not on file  Physical Activity: Not on file  Stress: Not on file  Social Connections: Not on file  Intimate Partner Violence: Not on file    Outpatient Medications Prior to Visit  Medication Sig Dispense Refill  . acetaminophen (TYLENOL) 500 MG tablet Take 500 mg by mouth 2 (two) times daily.    Marland Kitchen atorvastatin (LIPITOR) 20 MG tablet TAKE 1 TABLET BY MOUTH DAILY 90 tablet 3  . b complex vitamins tablet Take 1 tablet by mouth daily with lunch.    . carvedilol (COREG) 25 MG tablet TAKE 1 TABLET BY MOUTH 2 TIMES DAILY. GENERIC EQUIVALENT FOR COREG. 180 tablet 3  . CINNAMON PO Take 1 tablet by mouth daily with lunch.     . Coenzyme Q10 (CO Q 10) 100 MG CAPS Take 1 capsule by mouth daily with lunch.    . diclofenac sodium (VOLTAREN) 1 % GEL Apply 1 application topically 4 (four) times daily as needed (pain).   3  . ELIQUIS 5 MG TABS tablet TAKE 1 TABLET BY MOUTH 2 TIMES DAILY 99991111 tablet 1  . folic acid (FOLVITE) A999333 MCG tablet Take 400 mcg by mouth daily.    . Glucosamine HCl (GLUCOSAMINE PO) Take 1,500 mg by mouth every evening.    . hydroCHLOROthiazide (MICROZIDE PO) hydrochlorothiazide    . hydrocortisone 2.5 % cream Apply 1 application topically 2 (two) times daily as needed (rash on nose).     .  hydroxychloroquine (PLAQUENIL) 200 MG tablet Take 1 tablet (200 mg total) by mouth daily. 90 tablet 0  . Multiple Vitamin (MULTIVITAMIN) capsule Take 1 capsule by mouth daily.      . Multiple Vitamins-Minerals (OCUVITE PO) Take 1 tablet by mouth at bedtime.    . nitroGLYCERIN (NITROSTAT) 0.4 MG SL tablet Place 1 tablet (0.4 mg total) under the tongue every 5 (five) minutes x 3 doses as needed for chest pain. 25 tablet 0  . NON FORMULARY CPAP machine with sleep.    . pantoprazole (PROTONIX) 40 MG tablet TAKE 1 TABLET BY MOUTH EVERY DAY BEFORE BREAKFAST 90 tablet 3  . Probiotic Product (PROBIOTIC DAILY PO) Take 1 capsule by mouth daily with lunch.     . sacubitril-valsartan (ENTRESTO) 49-51 MG Take 1 tablet by mouth 2 (two) times daily. 180 tablet 2  . Saw Palmetto, Serenoa repens, (SAW PALMETTO PO) Take 1 capsule by mouth every evening.    Marland Kitchen spironolactone (ALDACTONE) 25 MG tablet TAKE 1/2 TABLET(12.5 MG) BY MOUTH DAILY 45 tablet 1  . tamsulosin (FLOMAX) 0.4 MG CAPS capsule Take 0.4 mg by mouth as needed.    . Testosterone 20 % CREA Apply 2 mLs topically daily. Rub on shoulder    . TURMERIC PO Take 1 tablet by mouth daily with lunch.     . traMADol (ULTRAM) 50 MG tablet TAKE 1 TABLET(50 MG) BY MOUTH AT BEDTIME AS NEEDED 30 tablet 0  . calcium carbonate (OSCAL) 1500 (600 Ca) MG TABS tablet Take 600 mg by mouth every evening. (Patient not taking: Reported on 06/26/2020)    . colchicine 0.6 MG tablet Take 1 tablet (0.6 mg total) by mouth daily. (Patient not taking: No sig reported) 30 tablet 0  . hydroxypropyl methylcellulose / hypromellose (ISOPTO TEARS / GONIOVISC) 2.5 %  ophthalmic solution Place 1 drop into both eyes 4 (four) times daily as needed for dry eyes.  (Patient not taking: No sig reported)    . lipase/protease/amylase (CREON) 36000 UNITS CPEP capsule 2 capsules during each meal and 1 capsule during each snack. (Patient not taking: No sig reported) 250 capsule 3   No facility-administered  medications prior to visit.    Allergies  Allergen Reactions  . Sulfonamide Derivatives Rash         ROS Review of Systems- o/w non contributory    Objective:    Physical Exam  BP 120/76   Pulse 78   Temp 98 F (36.7 C) (Oral)   Ht 5\' 8"  (1.727 m)   Wt 192 lb (87.1 kg)   SpO2 95%   BMI 29.19 kg/m  Wt Readings from Last 3 Encounters:  06/26/20 192 lb 12.8 oz (87.5 kg)  06/20/20 192 lb (87.1 kg)  06/08/20 194 lb (88 kg)  VS noted,  Constitutional: Pt appears in NAD HENT: Head: NCAT.  Right Ear: External ear normal.  Left Ear: External ear normal.  Eyes: . Pupils are equal, round, and reactive to light. Conjunctivae and EOM are normal Nose: without d/c or deformity Right axilla with , 1/2 cm area red tender subq mass Neck: Neck supple. Gross normal ROM Cardiovascular: Normal rate and regular rhythm.   Pulmonary/Chest: Effort normal and breath sounds without rales or wheezing.  Abd:  Soft, NT, ND, + BS, no organomegaly Neurological: Pt is alert. At baseline orientation, motor grossly intact Skin: Skin is warm. No rashes, other new lesions, no LE edema Psychiatric: Pt behavior is normal without agitation   There are no preventive care reminders to display for this patient.  There are no preventive care reminders to display for this patient.  Lab Results  Component Value Date   TSH 3.79 03/20/2020   Lab Results  Component Value Date   WBC 6.2 05/22/2020   HGB 14.5 05/22/2020   HCT 42.7 05/22/2020   MCV 90.5 05/22/2020   PLT 242 05/22/2020   Lab Results  Component Value Date   NA 139 05/22/2020   K 4.4 05/22/2020   CO2 28 05/22/2020   GLUCOSE 116 (H) 05/22/2020   BUN 22 05/22/2020   CREATININE 0.89 05/22/2020   BILITOT 1.0 05/22/2020   ALKPHOS 67 03/20/2020   AST 18 05/22/2020   ALT 19 05/22/2020   PROT 6.6 05/22/2020   ALBUMIN 4.5 03/20/2020   CALCIUM 9.6 05/22/2020   ANIONGAP 11 01/24/2019   GFR 84.68 03/20/2020   Lab Results  Component  Value Date   CHOL 97 01/24/2019   Lab Results  Component Value Date   HDL 27 (L) 01/24/2019   Lab Results  Component Value Date   LDLCALC 38 01/24/2019   Lab Results  Component Value Date   TRIG 161 (H) 01/24/2019   Lab Results  Component Value Date   CHOLHDL 3.6 01/24/2019   Lab Results  Component Value Date   HGBA1C 5.9 (H) 06/25/2019      Assessment & Plan:   Problem List Items Addressed This Visit      Medium   Hypertensive disorder    BP Readings from Last 3 Encounters:  06/26/20 130/80  06/20/20 120/76  06/08/20 (!) 143/77   Stable, pt to continue medical treatment - coreg, hct   Current Outpatient Medications (Cardiovascular):  .  atorvastatin (LIPITOR) 20 MG tablet, TAKE 1 TABLET BY MOUTH DAILY .  carvedilol (COREG) 25  MG tablet, TAKE 1 TABLET BY MOUTH 2 TIMES DAILY. GENERIC EQUIVALENT FOR COREG. .  hydroCHLOROthiazide (MICROZIDE PO), hydrochlorothiazide .  nitroGLYCERIN (NITROSTAT) 0.4 MG SL tablet, Place 1 tablet (0.4 mg total) under the tongue every 5 (five) minutes x 3 doses as needed for chest pain. .  sacubitril-valsartan (ENTRESTO) 49-51 MG, Take 1 tablet by mouth 2 (two) times daily. Marland Kitchen  spironolactone (ALDACTONE) 25 MG tablet, TAKE 1/2 TABLET(12.5 MG) BY MOUTH DAILY   Current Outpatient Medications (Analgesics):  .  acetaminophen (TYLENOL) 500 MG tablet, Take 500 mg by mouth 2 (two) times daily. .  traMADol (ULTRAM) 50 MG tablet, TAKE 1 TABLET(50 MG) BY MOUTH AT BEDTIME AS NEEDED  Current Outpatient Medications (Hematological):  Marland Kitchen  ELIQUIS 5 MG TABS tablet, TAKE 1 TABLET BY MOUTH 2 TIMES DAILY .  folic acid (FOLVITE) 371 MCG tablet, Take 400 mcg by mouth daily.  Current Outpatient Medications (Other):  .  b complex vitamins tablet, Take 1 tablet by mouth daily with lunch. Marland Kitchen  CINNAMON PO, Take 1 tablet by mouth daily with lunch.  .  Coenzyme Q10 (CO Q 10) 100 MG CAPS, Take 1 capsule by mouth daily with lunch. .  diclofenac sodium (VOLTAREN) 1  % GEL, Apply 1 application topically 4 (four) times daily as needed (pain).  Marland Kitchen  doxycycline (VIBRA-TABS) 100 MG tablet, Take 1 tablet (100 mg total) by mouth 2 (two) times daily. .  Glucosamine HCl (GLUCOSAMINE PO), Take 1,500 mg by mouth every evening. .  hydrocortisone 2.5 % cream, Apply 1 application topically 2 (two) times daily as needed (rash on nose).  .  hydroxychloroquine (PLAQUENIL) 200 MG tablet, Take 1 tablet (200 mg total) by mouth daily. .  Multiple Vitamin (MULTIVITAMIN) capsule, Take 1 capsule by mouth daily.   .  Multiple Vitamins-Minerals (OCUVITE PO), Take 1 tablet by mouth at bedtime. .  NON FORMULARY, CPAP machine with sleep. .  pantoprazole (PROTONIX) 40 MG tablet, TAKE 1 TABLET BY MOUTH EVERY DAY BEFORE BREAKFAST .  Probiotic Product (PROBIOTIC DAILY PO), Take 1 capsule by mouth daily with lunch.  .  Saw Palmetto, Serenoa repens, (SAW PALMETTO PO), Take 1 capsule by mouth every evening. .  tamsulosin (FLOMAX) 0.4 MG CAPS capsule, Take 0.4 mg by mouth as needed. .  Testosterone 20 % CREA, Apply 2 mLs topically daily. Rub on shoulder .  TURMERIC PO, Take 1 tablet by mouth daily with lunch.        Chronic diarrhea    dw pt  - ok for creon as trial for malabsorption,  to f/u any worsening symptoms or concerns      Axillary abscess - Primary    Mild to mod, for antibx course,  to f/u any worsening symptoms or concerns         Meds ordered this encounter  Medications  . doxycycline (VIBRA-TABS) 100 MG tablet    Sig: Take 1 tablet (100 mg total) by mouth 2 (two) times daily.    Dispense:  20 tablet    Refill:  0    Follow-up: Return if symptoms worsen or fail to improve.    Cathlean Cower, MD

## 2020-06-20 NOTE — Patient Instructions (Signed)
Please take all new medication as prescribed - the antibiotic  Please call for referral to general surgury if not improved in 3-5 days

## 2020-06-22 ENCOUNTER — Ambulatory Visit (HOSPITAL_COMMUNITY): Payer: Medicare Other | Attending: Cardiology

## 2020-06-22 ENCOUNTER — Other Ambulatory Visit: Payer: Self-pay

## 2020-06-22 DIAGNOSIS — I5022 Chronic systolic (congestive) heart failure: Secondary | ICD-10-CM | POA: Insufficient documentation

## 2020-06-22 LAB — ECHOCARDIOGRAM COMPLETE
Area-P 1/2: 2.99 cm2
MV M vel: 4.92 m/s
MV Peak grad: 96.8 mmHg
S' Lateral: 4 cm

## 2020-06-23 ENCOUNTER — Other Ambulatory Visit: Payer: Self-pay | Admitting: Rheumatology

## 2020-06-25 NOTE — Progress Notes (Signed)
Cardiology Office Note:    Date:  06/26/2020   ID:  [name], DOB [birth year], MRN [id number]  PCP:  Biagio Borg, MD  [facility] Cardiologist:  [name], MD  [place] Electrophysiologist:  Will [name], MD   Referring MD: Biagio Borg, MD   Chief Complaint  Patient presents with  . Shortness of Breath    History of Present Illness:    [name] is a 75 y.o. male with a hx of chronic systolic heart failure with left bundle branch block, coronary artery disease with remote stenting of the RCA, and paroxysmal atrial fibrillation. He presents for follow-up evaluation today. He underwent CRT-D implantation in 2018. He was ultimately found to have atrial fibrillation on remote device monitoring and was started on anticoagulation with apixaban. His antiplatelet therapy was stopped at that time. LV function normalized after cardiac resynchronization.  Cleave is here alone today. He has had some 'tingling' pains in the left chest under his pacemaker. No substernal pain or pressure. No exertional pain.   Past Medical History:  Diagnosis Date  . AICD (automatic cardioverter/defibrillator) present   . Allergic rhinitis   . Allergy   . Anginal pain ([place]) 05/26/14   chest pain after chasing dog  . Atypical nevus of back 04/27/2003   moderate - mid lower back  . Basal cell carcinoma 01/26/2008   right cheek - MOHs  . CAD (coronary artery disease)    hx of stent- 2005 RCA  . Cataract    beginning stage both eyes  . CHF (congestive heart failure) (HCC)    pacemaker Medtronic    . Chronic back pain    intermittent  . Constipation   . Dizziness   . Dysrhythmia    right bundle branch block   . Esophageal stricture   . GERD (gastroesophageal reflux disease)   . Hemoptysis   . Hiatal hernia   . History of colonic polyps    hyperplastic  . HTN (hypertension)   . Hyperlipidemia   . Hypertrophy of prostate with urinary obstruction and other lower  urinary tract symptoms (LUTS)   . OA (osteoarthritis)   . OSA (obstructive sleep apnea)    cpap- 10   . 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 Pericarditis 07/06/2019  . Pneumonia   . Pre-diabetes    no medications  . Shortness of breath    with exertion  . Sleep apnea    cpap  . Testicular hypofunction     Past Surgical History:  Procedure Laterality Date  . BIV ICD INSERTION CRT-D N/A 03/27/2017   Procedure: BIV ICD INSERTION CRT-D;  Surgeon: Constance Haw, MD;  Location: [place] [facility];  Service: Cardiovascular;  Laterality: N/A;  . CARDIAC CATHETERIZATION     '05, last 2009, showing patent RCA stent  . COLONOSCOPY  12/2007   HYPERPLASTIC POLYP  . COLONOSCOPY  12/2019  . COLONOSCOPY WITH PROPOFOL N/A 06/02/2014   Procedure: COLONOSCOPY WITH PROPOFOL;  Surgeon: [name], MD;  Location: [facility];  Service: Endoscopy;  Laterality: N/A;  . CORONARY ANGIOPLASTY  08/2003  . ESOPHAGOGASTRODUODENOSCOPY (EGD) WITH PROPOFOL N/A 06/02/2014   Procedure: ESOPHAGOGASTRODUODENOSCOPY (EGD) WITH PROPOFOL;  Surgeon: [name], MD;  Location: [facility];  Service: Endoscopy;  Laterality: N/A;  . hernia surgery x 3     Bilateral Inguinal, Umbicial  . INSERTION OF MESH  N/A 03/20/2018   Procedure: INSERTION OF MESH;  Surgeon: Alphonsa Overall, MD;  Location: [place];  Service: General;  Laterality: N/A;  . IRRIGATION AND DEBRIDEMENT ABSCESS Left 08/14/2012   Procedure: IRRIGATION AND DEBRIDEMENT LEFT INGUINAL BOIL ;  Surgeon: Ailene Rud, MD;  Location: [facility];  Service: Urology;  Laterality: Left;  . JOINT REPLACEMENT Right 2017  . KNEE ARTHROPLASTY    . LEFT HEART CATH AND CORONARY ANGIOGRAPHY N/A 01/24/2019   Procedure: LEFT HEART CATH AND CORONARY ANGIOGRAPHY;  Surgeon: Martinique, Peter M, MD;  Location: [place] [facility];  Service: Cardiovascular;  Laterality: N/A;  . LEFT HEART  CATHETERIZATION WITH CORONARY ANGIOGRAM N/A 05/26/2014   Procedure: LEFT HEART CATHETERIZATION WITH CORONARY ANGIOGRAM;  Surgeon: Blane Ohara, MD;  Location: [facility];  Service: Cardiovascular;  Laterality: N/A;  . LUMBAR LAMINECTOMY/DECOMPRESSION MICRODISCECTOMY Left 09/26/2014   Procedure: Lumbar Laminectomy for resection of synovial cyst  Lumbar five- sacral one left;  Surgeon: Kary Kos, MD;  Location: [place] [facility];  Service: Neurosurgery;  Laterality: Left;  . MOLE REMOVAL Left 03/20/2018   Procedure: MOLE REMOVAL;  Surgeon: Alphonsa Overall, MD;  Location: [place];  Service: General;  Laterality: Left;  . Oral surg to removed growth from ?sinus  10/2010   ?Dermoid removed by DrRiggs  . PACEMAKER INSERTION  04/03/2017  . POLYPECTOMY    . RCA stenting     '05 RCA  . right toe surgery  Right    Cyst   . RIGHT/LEFT HEART CATH AND CORONARY ANGIOGRAPHY N/A 01/15/2017   Procedure: RIGHT/LEFT HEART CATH AND CORONARY ANGIOGRAPHY;  Surgeon: [name], MD;  Location: [place] [facility];  Service: Cardiovascular;  Laterality: N/A;  . s/p right knee arthroscopy  2005  . thumb surgery Right   . TOTAL KNEE ARTHROPLASTY Right 03/12/2016   Procedure: RIGHT TOTAL KNEE ARTHROPLASTY;  Surgeon: Paralee Cancel, MD;  Location: [facility];  Service: Orthopedics;  Laterality: Right;  . UPPER GASTROINTESTINAL ENDOSCOPY    . VENTRAL HERNIA REPAIR N/A 03/20/2018   Procedure: [place];  Surgeon: Alphonsa Overall, MD;  Location: [place];  Service: General;  Laterality: N/A;    Current Medications: Current Meds  Medication Sig  . acetaminophen (TYLENOL) 500 MG tablet Take 500 mg by mouth 2 (two) times daily.  Marland Kitchen atorvastatin (LIPITOR) 20 MG tablet TAKE 1 TABLET BY MOUTH DAILY  . b complex vitamins tablet Take 1 tablet by mouth daily with lunch.  . carvedilol (COREG) 25 MG tablet TAKE 1 TABLET BY MOUTH 2 TIMES DAILY. GENERIC EQUIVALENT FOR COREG.  . CINNAMON PO Take 1 tablet by  mouth daily with lunch.   . Coenzyme Q10 (CO Q 10) 100 MG CAPS Take 1 capsule by mouth daily with lunch.  . diclofenac sodium (VOLTAREN) 1 % GEL Apply 1 application topically 4 (four) times daily as needed (pain).   Marland Kitchen doxycycline (VIBRA-TABS) 100 MG tablet Take 1 tablet (100 mg total) by mouth 2 (two) times daily.  Marland Kitchen ELIQUIS 5 MG TABS tablet TAKE 1 TABLET BY MOUTH 2 TIMES DAILY  . folic acid (FOLVITE) 213 MCG tablet Take 400 mcg by mouth daily.  . Glucosamine HCl (GLUCOSAMINE PO) Take 1,500 mg by mouth every evening.  . hydroCHLOROthiazide (MICROZIDE PO) hydrochlorothiazide  . hydrocortisone 2.5 % cream Apply 1 application topically 2 (two) times daily as needed (rash on nose).   . hydroxychloroquine (PLAQUENIL) 200 MG tablet Take 1 tablet (200 mg total)  by mouth daily.  . Multiple Vitamin (MULTIVITAMIN) capsule Take 1 capsule by mouth daily.    . Multiple Vitamins-Minerals (OCUVITE PO) Take 1 tablet by mouth at bedtime.  . nitroGLYCERIN (NITROSTAT) 0.4 MG SL tablet Place 1 tablet (0.4 mg total) under the tongue every 5 (five) minutes x 3 doses as needed for chest pain.  . NON FORMULARY CPAP machine with sleep.  . pantoprazole (PROTONIX) 40 MG tablet TAKE 1 TABLET BY MOUTH EVERY DAY BEFORE BREAKFAST  . Probiotic Product (PROBIOTIC DAILY PO) Take 1 capsule by mouth daily with lunch.   . sacubitril-valsartan (ENTRESTO) 49-51 MG Take 1 tablet by mouth 2 (two) times daily.  . Saw Palmetto, Serenoa repens, (SAW PALMETTO PO) Take 1 capsule by mouth every evening.  Marland Kitchen spironolactone (ALDACTONE) 25 MG tablet TAKE 1/2 TABLET(12.5 MG) BY MOUTH DAILY  . tamsulosin (FLOMAX) 0.4 MG CAPS capsule Take 0.4 mg by mouth as needed.  . Testosterone 20 % CREA Apply 2 mLs topically daily. Rub on shoulder  . traMADol (ULTRAM) 50 MG tablet TAKE 1 TABLET(50 MG) BY MOUTH AT BEDTIME AS NEEDED  . TURMERIC PO Take 1 tablet by mouth daily with lunch.      Allergies:   Sulfonamide derivatives   Social History    Socioeconomic History  . Marital status: Married    Spouse name: Not on file  . Number of children: 1  . Years of education: Not on file  . Highest education level: Not on file  Occupational History  . Occupation: Lobbyist: [employer]  Tobacco Use  . Smoking status: Former Smoker    Quit date: 05/20/1990    Years since quitting: 30.1  . Smokeless tobacco: Never Used  . Tobacco comment: previous 30 pack year history  Vaping Use  . Vaping Use: Never used  Substance and Sexual Activity  . Alcohol use: Yes    Comment: rare  . Drug use: Never  . Sexual activity: Not on file  Other Topics Concern  . Not on file  Social History Narrative  . Not on file   Social Determinants of Health   Financial Resource Strain: Not on file  Food Insecurity: Not on file  Transportation Needs: Not on file  Physical Activity: Not on file  Stress: Not on file  Social Connections: Not on file     Family History: The patient's family history includes Arthritis in his brother and daughter; COPD in his brother; Cancer in his brother; Coronary artery disease in his father; Diabetes in his father; Heart disease in his father and mother; Heart failure in his brother; Hypertension in an other family member; Stomach cancer in his paternal grandmother; Sudden death in his father. There is no history of Colon cancer, Esophageal cancer, Colon polyps, or Ulcerative colitis.  ROS:   Please see the history of present illness.    All other systems reviewed and are negative.  EKGs/Labs/Other Studies Reviewed:    The following studies were reviewed today: Echo 06-22-2020: IMPRESSIONS    1. Left ventricular ejection fraction, by estimation, is 50 to 55%. The  left ventricle has low normal function. The left ventricle has no regional  wall motion abnormalities. There is moderate concentric left ventricular  hypertrophy. Left ventricular  diastolic parameters are indeterminate.   2. Right ventricular systolic function is normal. The right ventricular  size is normal. There is normal pulmonary artery systolic pressure.  3. Left atrial size was mildly dilated.  4.  The mitral valve is normal in structure. Mild mitral valve  regurgitation. No evidence of mitral stenosis.  5. The aortic valve is tricuspid. There is mild calcification of the  aortic valve. Aortic valve regurgitation is not visualized. No aortic  stenosis is present.   Comparison(s): No significant change from prior study.  EKG:  EKG is not ordered today.    Recent Labs: 03/20/2020: TSH 3.79 05/22/2020: ALT 19; BUN 22; Creat 0.89; Hemoglobin 14.5; Platelets 242; Potassium 4.4; Sodium 139  Recent Lipid Panel    Component Value Date/Time   CHOL 97 01/24/2019 0725   CHOL 138 11/27/2016 0914   TRIG 161 (H) 01/24/2019 0725   HDL 27 (L) 01/24/2019 0725   HDL 35 (L) 11/27/2016 0914   CHOLHDL 3.6 01/24/2019 0725   VLDL 32 01/24/2019 0725   LDLCALC 38 01/24/2019 0725   LDLCALC 65 11/27/2016 0914     Risk Assessment/Calculations:    CHA2DS2-VASc Score = 5  This indicates a 7.2% annual risk of stroke. The patient's score is based upon: CHF History: Yes HTN History: Yes Diabetes History: No Stroke History: No Vascular Disease History: Yes Age Score: 2 Gender Score: 0      Physical Exam:    VS:  BP 130/80   Pulse 87   Ht 5\' 7"  (1.702 m)   Wt 192 lb 12.8 oz (87.5 kg)   SpO2 97%   BMI 30.20 kg/m     Wt Readings from Last 3 Encounters:  06/26/20 192 lb 12.8 oz (87.5 kg)  06/20/20 192 lb (87.1 kg)  06/08/20 194 lb (88 kg)     GEN:  Well nourished, well developed in no acute distress HEENT: Normal NECK: No JVD; No carotid bruits LYMPHATICS: No lymphadenopathy CARDIAC: RRR, no murmurs, rubs, gallops RESPIRATORY:  Clear to auscultation without rales, wheezing or rhonchi  ABDOMEN: Soft, non-tender, non-distended MUSCULOSKELETAL:  No edema; No deformity  SKIN: Warm and  dry NEUROLOGIC:  Alert and oriented x 3 PSYCHIATRIC:  Normal affect   ASSESSMENT:    1. Chronic systolic (congestive) heart failure (HCC)   2. Paroxysmal atrial fibrillation ([place])   3. Coronary artery disease involving native coronary artery of native heart without angina pectoris   4. Mixed hyperlipidemia    PLAN:    In order of problems listed above:  1. Patient doing well.  New York Heart Association functional class II symptoms.  LVEF low normal by recent echo.  Medical program reviewed on spironolactone, Entresto, and carvedilol at goal doses. 2. Doing well, anticoagulated with apixaban.  No bleeding problems. 3. Atypical chest pain noted.  No anginal symptoms.  No exertional symptoms.  Continue current medical therapy. 4. Lifestyle modification reviewed.  Not physically active anymore.  Treated with atorvastatin 20 mg.  Lipids reviewed with cholesterol 97, LDL 38, HDL 27.    Medication Adjustments/Labs and Tests Ordered: Current medicines are reviewed at length with the patient today.  Concerns regarding medicines are outlined above.  No orders of the defined types were placed in this encounter.  No orders of the defined types were placed in this encounter.   There are no Patient Instructions on file for this visit.   Signed, [name], MD  06/26/2020 9:00 AM    [place]

## 2020-06-26 ENCOUNTER — Encounter: Payer: Self-pay | Admitting: Internal Medicine

## 2020-06-26 ENCOUNTER — Ambulatory Visit (INDEPENDENT_AMBULATORY_CARE_PROVIDER_SITE_OTHER): Payer: Medicare Other | Admitting: Cardiovascular Disease

## 2020-06-26 ENCOUNTER — Other Ambulatory Visit: Payer: Self-pay

## 2020-06-26 ENCOUNTER — Encounter: Payer: Self-pay | Admitting: Cardiovascular Disease

## 2020-06-26 VITALS — BP 130/80 | HR 87 | Ht 67.0 in | Wt 192.8 lb

## 2020-06-26 DIAGNOSIS — E782 Mixed hyperlipidemia: Secondary | ICD-10-CM | POA: Diagnosis not present

## 2020-06-26 DIAGNOSIS — I5022 Chronic systolic (congestive) heart failure: Secondary | ICD-10-CM

## 2020-06-26 DIAGNOSIS — I2510 Atherosclerotic heart disease of native coronary artery without angina pectoris: Secondary | ICD-10-CM

## 2020-06-26 DIAGNOSIS — I480 Paroxysmal atrial fibrillation: Secondary | ICD-10-CM | POA: Diagnosis not present

## 2020-06-26 DIAGNOSIS — L02419 Cutaneous abscess of limb, unspecified: Secondary | ICD-10-CM | POA: Insufficient documentation

## 2020-06-26 DIAGNOSIS — K529 Noninfective gastroenteritis and colitis, unspecified: Secondary | ICD-10-CM | POA: Insufficient documentation

## 2020-06-26 NOTE — Assessment & Plan Note (Signed)
dw pt  - ok for creon as trial for malabsorption,  to f/u any worsening symptoms or concerns

## 2020-06-26 NOTE — Patient Instructions (Signed)

## 2020-06-26 NOTE — Telephone Encounter (Signed)
Last Visit: 1/202/2022 Next Visit: 11/09/2020 UDS:05/22/2020,UDS is consistent with treatment.   Narc Agreement: 11/24/2019  Last Fill: 05/23/2020  Okay to refill Tramadol?

## 2020-06-26 NOTE — Assessment & Plan Note (Signed)
Mild to mod, for antibx course,  to f/u any worsening symptoms or concerns 

## 2020-06-26 NOTE — Assessment & Plan Note (Signed)
BP Readings from Last 3 Encounters:  06/26/20 130/80  06/20/20 120/76  06/08/20 (!) 143/77   Stable, pt to continue medical treatment - coreg, hct   Current Outpatient Medications (Cardiovascular):  .  atorvastatin (LIPITOR) 20 MG tablet, TAKE 1 TABLET BY MOUTH DAILY .  carvedilol (COREG) 25 MG tablet, TAKE 1 TABLET BY MOUTH 2 TIMES DAILY. GENERIC EQUIVALENT FOR COREG. .  hydroCHLOROthiazide (MICROZIDE PO), hydrochlorothiazide .  nitroGLYCERIN (NITROSTAT) 0.4 MG SL tablet, Place 1 tablet (0.4 mg total) under the tongue every 5 (five) minutes x 3 doses as needed for chest pain. .  sacubitril-valsartan (ENTRESTO) 49-51 MG, Take 1 tablet by mouth 2 (two) times daily. Marland Kitchen  spironolactone (ALDACTONE) 25 MG tablet, TAKE 1/2 TABLET(12.5 MG) BY MOUTH DAILY   Current Outpatient Medications (Analgesics):  .  acetaminophen (TYLENOL) 500 MG tablet, Take 500 mg by mouth 2 (two) times daily. .  traMADol (ULTRAM) 50 MG tablet, TAKE 1 TABLET(50 MG) BY MOUTH AT BEDTIME AS NEEDED  Current Outpatient Medications (Hematological):  Marland Kitchen  ELIQUIS 5 MG TABS tablet, TAKE 1 TABLET BY MOUTH 2 TIMES DAILY .  folic acid (FOLVITE) 161 MCG tablet, Take 400 mcg by mouth daily.  Current Outpatient Medications (Other):  .  b complex vitamins tablet, Take 1 tablet by mouth daily with lunch. Marland Kitchen  CINNAMON PO, Take 1 tablet by mouth daily with lunch.  .  Coenzyme Q10 (CO Q 10) 100 MG CAPS, Take 1 capsule by mouth daily with lunch. .  diclofenac sodium (VOLTAREN) 1 % GEL, Apply 1 application topically 4 (four) times daily as needed (pain).  Marland Kitchen  doxycycline (VIBRA-TABS) 100 MG tablet, Take 1 tablet (100 mg total) by mouth 2 (two) times daily. .  Glucosamine HCl (GLUCOSAMINE PO), Take 1,500 mg by mouth every evening. .  hydrocortisone 2.5 % cream, Apply 1 application topically 2 (two) times daily as needed (rash on nose).  .  hydroxychloroquine (PLAQUENIL) 200 MG tablet, Take 1 tablet (200 mg total) by mouth daily. .  Multiple  Vitamin (MULTIVITAMIN) capsule, Take 1 capsule by mouth daily.   .  Multiple Vitamins-Minerals (OCUVITE PO), Take 1 tablet by mouth at bedtime. .  NON FORMULARY, CPAP machine with sleep. .  pantoprazole (PROTONIX) 40 MG tablet, TAKE 1 TABLET BY MOUTH EVERY DAY BEFORE BREAKFAST .  Probiotic Product (PROBIOTIC DAILY PO), Take 1 capsule by mouth daily with lunch.  .  Saw Palmetto, Serenoa repens, (SAW PALMETTO PO), Take 1 capsule by mouth every evening. .  tamsulosin (FLOMAX) 0.4 MG CAPS capsule, Take 0.4 mg by mouth as needed. .  Testosterone 20 % CREA, Apply 2 mLs topically daily. Rub on shoulder .  TURMERIC PO, Take 1 tablet by mouth daily with lunch.

## 2020-06-30 ENCOUNTER — Encounter: Payer: Self-pay | Admitting: Internal Medicine

## 2020-06-30 ENCOUNTER — Ambulatory Visit (INDEPENDENT_AMBULATORY_CARE_PROVIDER_SITE_OTHER): Payer: Medicare Other

## 2020-06-30 DIAGNOSIS — I5022 Chronic systolic (congestive) heart failure: Secondary | ICD-10-CM

## 2020-06-30 DIAGNOSIS — L02411 Cutaneous abscess of right axilla: Secondary | ICD-10-CM

## 2020-06-30 DIAGNOSIS — Z95810 Presence of automatic (implantable) cardiac defibrillator: Secondary | ICD-10-CM

## 2020-07-05 NOTE — Progress Notes (Signed)
EPIC Encounter for ICM Monitoring  Patient Name: [name] is a 75 y.o. male Date: 07/05/2020 Primary Care Physican: Biagio Borg, MD Primary Cardiologist:[place] Electrophysiologist:Camnitz Bi-V Pacing:98.4% 07/05/2020 Weight:193lbs  Time in AT/AF   0.0 hr/day (0.0%)   Spoke with patient and reports feeling well at this time.  Denies fluid symptoms.    Optivol thoracic impedancenormal.  Takes Spirolactone 25 mg take 0.5 tablet daily.  Recommendations:No changes and encouraged to call if experiencing any fluid symptoms.  Follow-up plan: ICM clinic phone appointment on3/18/2022. 91 day device clinic remote transmission3/17/2022.   EP/Cardiology Office Visits:2/22/2022with Dr.Camnitz.   Copy of ICM check sent to [facility].   3 month ICM trend: 06/30/2020.    1 Year ICM trend:       [name], RN 07/05/2020 5:03 PM

## 2020-07-11 ENCOUNTER — Other Ambulatory Visit: Payer: Self-pay

## 2020-07-11 ENCOUNTER — Ambulatory Visit (INDEPENDENT_AMBULATORY_CARE_PROVIDER_SITE_OTHER): Payer: Medicare Other | Admitting: Cardiology

## 2020-07-11 ENCOUNTER — Encounter: Payer: Self-pay | Admitting: Cardiology

## 2020-07-11 VITALS — BP 116/74 | HR 77 | Ht 67.0 in | Wt 195.4 lb

## 2020-07-11 DIAGNOSIS — I5022 Chronic systolic (congestive) heart failure: Secondary | ICD-10-CM | POA: Diagnosis not present

## 2020-07-11 DIAGNOSIS — I2510 Atherosclerotic heart disease of native coronary artery without angina pectoris: Secondary | ICD-10-CM

## 2020-07-11 NOTE — Patient Instructions (Signed)
Medication Instructions:  Your physician recommends that you continue on your current medications as directed. Please refer to the Current Medication list given to you today.  *If you need a refill on your cardiac medications before your next appointment, please call your pharmacy*   Lab Work: None ordered If you have labs (blood work) drawn today and your tests are completely normal, you will receive your results only by: . MyChart Message (if you have MyChart) OR . A paper copy in the mail If you have any lab test that is abnormal or we need to change your treatment, we will call you to review the results.   Testing/Procedures: None ordered   Follow-Up: At [facility], you and your health needs are our priority.  As part of our continuing mission to provide you with exceptional heart care, we have created designated Provider Care Teams.  These Care Teams include your primary Cardiologist (physician) and Advanced Practice Providers (APPs -  Physician Assistants and Nurse Practitioners) who all work together to provide you with the care you need, when you need it.  We recommend signing up for the patient portal called "MyChart".  Sign up information is provided on this After Visit Summary.  MyChart is used to connect with patients for Virtual Visits (Telemedicine).  Patients are able to view lab/test results, encounter notes, upcoming appointments, etc.  Non-urgent messages can be sent to your provider as well.   To learn more about what you can do with MyChart, go to [email or web address].    Your next appointment:   1 year(s)  The format for your next appointment:   In Person  Provider:   Will Camnitz, MD   Thank you for choosing [facility]!!   Kassady Laboy, RN [phone]    Other Instructions    

## 2020-07-11 NOTE — Progress Notes (Signed)
Electrophysiology Office Note   Date:  07/11/2020   ID:  [name], [name] June 28, 1945, MRN [id number]  PCP:  Biagio Borg, MD  Cardiologist:  Burt Knack Primary Electrophysiologist:  Dr Curt Bears    CC: Follow up for device check/new onset afib.   History of Present Illness: [name] is a 75 y.o. male who is being seen today for the evaluation of CHF at the request of Biagio Borg, MD. Presenting today for electrophysiology evaluation.   He has a history of chronic systolic heart failure, coronary artery disease status post RCA stent, left bundle branch block, obstructive sleep apnea currently on CPAP.  He is status post Medtronic CRT-D implanted 03/27/2017.  Implant was complicated by microperforation.  He is put on colchicine with greatly improved symptoms.  He has had episodes of atrial fibrillation and is now on Eliquis.  Today, denies symptoms of palpitations, chest pain, shortness of breath, orthopnea, PND, lower extremity edema, claudication, dizziness, presyncope, syncope, bleeding, or neurologic sequela. The patient is tolerating medications without difficulties.  Since last being seen he has done well.  He has no chest pain or shortness of breath.  He is able do all of his daily activities without restriction.  Past Medical History:  Diagnosis Date  . AICD (automatic cardioverter/defibrillator) present   . Allergic rhinitis   . Allergy   . Anginal pain ([place]) 05/26/14   chest pain after chasing dog  . Atypical nevus of back 04/27/2003   moderate - mid lower back  . Basal cell carcinoma 01/26/2008   right cheek - MOHs  . CAD (coronary artery disease)    hx of stent- 2005 RCA  . Cataract    beginning stage both eyes  . CHF (congestive heart failure) (HCC)    pacemaker Medtronic    . Chronic back pain    intermittent  . Constipation   . Dizziness   . Dysrhythmia    right bundle branch block   . Esophageal stricture   . GERD (gastroesophageal reflux disease)   .  Hemoptysis   . Hiatal hernia   . History of colonic polyps    hyperplastic  . HTN (hypertension)   . Hyperlipidemia   . Hypertrophy of prostate with urinary obstruction and other lower urinary tract symptoms (LUTS)   . OA (osteoarthritis)   . OSA (obstructive sleep apnea)    cpap- 10   . 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 Pericarditis 07/06/2019  . Pneumonia   . Pre-diabetes    no medications  . Shortness of breath    with exertion  . Sleep apnea    cpap  . Testicular hypofunction    Past Surgical History:  Procedure Laterality Date  . BIV ICD INSERTION CRT-D N/A 03/27/2017   Procedure: BIV ICD INSERTION CRT-D;  Surgeon: Constance Haw, MD;  Location: [facility];  Service: Cardiovascular;  Laterality: N/A;  . CARDIAC CATHETERIZATION     '05, last 2009, showing patent RCA stent  . COLONOSCOPY  12/2007   HYPERPLASTIC POLYP  . COLONOSCOPY  12/2019  . COLONOSCOPY WITH PROPOFOL N/A 06/02/2014   Procedure: COLONOSCOPY WITH PROPOFOL;  Surgeon: [name], MD;  Location: [facility];  Service: Endoscopy;  Laterality: N/A;  . CORONARY ANGIOPLASTY  08/2003  . ESOPHAGOGASTRODUODENOSCOPY (EGD) WITH PROPOFOL N/A 06/02/2014   Procedure: ESOPHAGOGASTRODUODENOSCOPY (EGD) WITH PROPOFOL;  Surgeon: [name],  MD;  Location: [facility];  Service: Endoscopy;  Laterality: N/A;  . hernia surgery x 3     Bilateral Inguinal, Umbicial  . INSERTION OF MESH N/A 03/20/2018   Procedure: INSERTION OF MESH;  Surgeon: Alphonsa Overall, MD;  Location: [facility];  Service: General;  Laterality: N/A;  . IRRIGATION AND DEBRIDEMENT ABSCESS Left 08/14/2012   Procedure: IRRIGATION AND DEBRIDEMENT LEFT INGUINAL BOIL ;  Surgeon: Ailene Rud, MD;  Location: [facility];  Service: Urology;  Laterality: Left;  . JOINT REPLACEMENT Right 2017  . KNEE ARTHROPLASTY    . LEFT HEART CATH AND CORONARY ANGIOGRAPHY N/A  01/24/2019   Procedure: LEFT HEART CATH AND CORONARY ANGIOGRAPHY;  Surgeon: Martinique, Peter M, MD;  Location: [facility];  Service: Cardiovascular;  Laterality: N/A;  . LEFT HEART CATHETERIZATION WITH CORONARY ANGIOGRAM N/A 05/26/2014   Procedure: LEFT HEART CATHETERIZATION WITH CORONARY ANGIOGRAM;  Surgeon: Blane Ohara, MD;  Location: [facility];  Service: Cardiovascular;  Laterality: N/A;  . LUMBAR LAMINECTOMY/DECOMPRESSION MICRODISCECTOMY Left 09/26/2014   Procedure: Lumbar Laminectomy for resection of synovial cyst  Lumbar five- sacral one left;  Surgeon: Kary Kos, MD;  Location: [place] [facility];  Service: Neurosurgery;  Laterality: Left;  . MOLE REMOVAL Left 03/20/2018   Procedure: MOLE REMOVAL;  Surgeon: Alphonsa Overall, MD;  Location: [facility];  Service: General;  Laterality: Left;  . Oral surg to removed growth from ?sinus  10/2010   ?Dermoid removed by DrRiggs  . PACEMAKER INSERTION  04/03/2017  . POLYPECTOMY    . RCA stenting     '05 RCA  . right toe surgery  Right    Cyst   . RIGHT/LEFT HEART CATH AND CORONARY ANGIOGRAPHY N/A 01/15/2017   Procedure: RIGHT/LEFT HEART CATH AND CORONARY ANGIOGRAPHY;  Surgeon: [name], MD;  Location: [place];  Service: Cardiovascular;  Laterality: N/A;  . s/p right knee arthroscopy  2005  . thumb surgery Right   . TOTAL KNEE ARTHROPLASTY Right 03/12/2016   Procedure: RIGHT TOTAL KNEE ARTHROPLASTY;  Surgeon: Paralee Cancel, MD;  Location: [facility];  Service: Orthopedics;  Laterality: Right;  . UPPER GASTROINTESTINAL ENDOSCOPY    . VENTRAL HERNIA REPAIR N/A 03/20/2018   Procedure: [place];  Surgeon: Alphonsa Overall, MD;  Location: [facility];  Service: General;  Laterality: N/A;     Current Outpatient Medications  Medication Sig Dispense Refill  . acetaminophen (TYLENOL) 500 MG tablet Take 500 mg by mouth 2 (two) times daily.    Marland Kitchen atorvastatin (LIPITOR) 20 MG tablet TAKE 1 TABLET BY MOUTH DAILY 90  tablet 3  . b complex vitamins tablet Take 1 tablet by mouth daily with lunch.    . carvedilol (COREG) 25 MG tablet TAKE 1 TABLET BY MOUTH 2 TIMES DAILY. GENERIC EQUIVALENT FOR COREG. 180 tablet 3  . CINNAMON PO Take 1 tablet by mouth daily with lunch.     . Coenzyme Q10 (CO Q 10) 100 MG CAPS Take 1 capsule by mouth daily with lunch.    . diclofenac sodium (VOLTAREN) 1 % GEL Apply 1 application topically 4 (four) times daily as needed (pain).   3  . doxycycline (VIBRA-TABS) 100 MG tablet Take 1 tablet (100 mg total) by mouth 2 (two) times daily. 20 tablet 0  . ELIQUIS 5 MG TABS tablet TAKE 1 TABLET BY MOUTH 2 TIMES DAILY 213 tablet 1  . folic acid (FOLVITE) 086 MCG tablet Take 400 mcg by mouth  daily.    . Glucosamine HCl (GLUCOSAMINE PO) Take 1,500 mg by mouth every evening.    . hydroCHLOROthiazide (MICROZIDE PO) hydrochlorothiazide    . hydrocortisone 2.5 % cream Apply 1 application topically 2 (two) times daily as needed (rash on nose).     . hydroxychloroquine (PLAQUENIL) 200 MG tablet Take 1 tablet (200 mg total) by mouth daily. 90 tablet 0  . Multiple Vitamin (MULTIVITAMIN) capsule Take 1 capsule by mouth daily.      . Multiple Vitamins-Minerals (OCUVITE PO) Take 1 tablet by mouth at bedtime.    . nitroGLYCERIN (NITROSTAT) 0.4 MG SL tablet Place 1 tablet (0.4 mg total) under the tongue every 5 (five) minutes x 3 doses as needed for chest pain. 25 tablet 0  . NON FORMULARY CPAP machine with sleep.    . pantoprazole (PROTONIX) 40 MG tablet TAKE 1 TABLET BY MOUTH EVERY DAY BEFORE BREAKFAST 90 tablet 3  . Probiotic Product (PROBIOTIC DAILY PO) Take 1 capsule by mouth daily with lunch.     . sacubitril-valsartan (ENTRESTO) 49-51 MG Take 1 tablet by mouth 2 (two) times daily. 180 tablet 2  . Saw Palmetto, Serenoa repens, (SAW PALMETTO PO) Take 1 capsule by mouth every evening.    Marland Kitchen spironolactone (ALDACTONE) 25 MG tablet TAKE 1/2 TABLET(12.5 MG) BY MOUTH DAILY 45 tablet 1  . tamsulosin (FLOMAX)  0.4 MG CAPS capsule Take 0.4 mg by mouth as needed.    . Testosterone 20 % CREA Apply 2 mLs topically daily. Rub on shoulder    . traMADol (ULTRAM) 50 MG tablet TAKE 1 TABLET(50 MG) BY MOUTH AT BEDTIME AS NEEDED 30 tablet 0  . TURMERIC PO Take 1 tablet by mouth daily with lunch.      No current facility-administered medications for this visit.    Allergies:   Sulfonamide derivatives   Social History:  The patient  reports that he quit smoking about 30 years ago. He has never used smokeless tobacco. He reports current alcohol use. He reports that he does not use drugs.   Family History:  The patient's family history includes Arthritis in his brother and daughter; COPD in his brother; Cancer in his brother; Coronary artery disease in his father; Diabetes in his father; Heart disease in his father and mother; Heart failure in his brother; Hypertension in an other family member; Stomach cancer in his paternal grandmother; Sudden death in his father.   ROS:  Please see the history of present illness.   Otherwise, review of systems is positive for none.   All other systems are reviewed and negative.   PHYSICAL EXAM: VS:  BP 116/74   Pulse 77   Ht 5\' 7"  (1.702 m)   Wt 195 lb 6.4 oz (88.6 kg)   SpO2 95%   BMI 30.60 kg/m  , BMI Body mass index is 30.6 kg/m. GEN: Well nourished, well developed, in no acute distress  HEENT: normal  Neck: no JVD, carotid bruits, or masses Cardiac: RRR; no murmurs, rubs, or gallops,no edema  Respiratory:  clear to auscultation bilaterally, normal work of breathing GI: soft, nontender, nondistended, + BS MS: no deformity or atrophy  Skin: warm and dry, device site well healed Neuro:  Strength and sensation are intact Psych: euthymic mood, full affect  EKG:  EKG is ordered today. Personal review of the ekg ordered shows just  Personal review of the device interrogation today. Results in [place]: 03/20/2020: TSH 3.79 05/22/2020: ALT 19; BUN 22;  Creat 0.89; Hemoglobin 14.5; Platelets 242; Potassium 4.4; Sodium 139    Lipid Panel     Component Value Date/Time   CHOL 97 01/24/2019 0725   CHOL 138 11/27/2016 0914   TRIG 161 (H) 01/24/2019 0725   HDL 27 (L) 01/24/2019 0725   HDL 35 (L) 11/27/2016 0914   CHOLHDL 3.6 01/24/2019 0725   VLDL 32 01/24/2019 0725   LDLCALC 38 01/24/2019 0725   LDLCALC 65 11/27/2016 0914     Wt Readings from Last 3 Encounters:  07/11/20 195 lb 6.4 oz (88.6 kg)  06/26/20 192 lb 12.8 oz (87.5 kg)  06/20/20 192 lb (87.1 kg)      Other studies Reviewed: Additional studies/ records that were reviewed today include: TTE 06/25/16 Review of the above records today demonstrates:  - Left ventricle: The cavity size was normal. Systolic function was   normal. The estimated ejection fraction was in the range of 55%   to 60%. Wall motion was normal; there were no regional wall   motion abnormalities. Doppler parameters are consistent with   abnormal left ventricular relaxation (grade 1 diastolic   dysfunction). - Ventricular septum: Septal motion showed dyssynergy. These   changes are consistent with a left bundle branch block. - Aortic valve: There was trivial regurgitation. - Atrial septum: There was increased thickness of the septum,   consistent with lipomatous hypertrophy.  LHC/RHC 01/05/17 1. Widely patent coronary arteries with continued patency of the stented segment in the right coronary artery 2. Mild nonobstructive coronary artery disease as detailed with mild stenosis of the proximal to mid LAD and otherwise minimal luminal irregularities 3. Well compensated right-sided cardiac hemodynamics  ASSESSMENT AND PLAN:  1.  Chronic systolic status post Medtronic CRT-D implanted 03/27/2017.  Has had normalization of his overall LV function.  Device functioning appropriately.  No changes.    2.  Paroxysmal atrial fibrillation: Currently on Eliquis.  CHA2DS2-VASc of 4.  3.  Hyperlipidemia: Continue  statin  4.  Hypertension: Currently well controlled  5.  Coronary artery disease: No current chest pain  Current medicines are reviewed at length with the patient today.   The patient does not have concerns regarding his medicines.  The following changes were made today: None  Labs/ tests ordered today include:  Orders Placed This Encounter  Procedures  . EKG 12-Lead     Disposition:   FU with Merick Kelleher 12 months  Signed, Joely Losier [name], MD  07/11/2020 2:21 PM     [address] [place] [address] [phone] (office) [phone] (fax)

## 2020-07-26 ENCOUNTER — Other Ambulatory Visit: Payer: Self-pay | Admitting: Rheumatology

## 2020-07-26 DIAGNOSIS — M1990 Unspecified osteoarthritis, unspecified site: Secondary | ICD-10-CM

## 2020-07-26 NOTE — Telephone Encounter (Signed)
Last Visit: 06/08/2020 Next Visit: 11/09/2020 Labs: 05/22/2020, CBC is normal, glucose is mildly elevated-probably not a fasting sample. Eye exam: 03/09/2020  Current Dose per office note 06/08/2020, PLQ 200 mg 1 tablet po qd DX: Rheumatoid arthritis involving multiple sites with positive rheumatoid factor   Last Fill: 06/08/2020  Okay to refill Plaquenil?  UDS: 05/22/2020, UDS is consistent with treatment. Narc Agreement: 06/08/2020  Last Fill: 06/26/2020, tramadol 50mg  1 tablet by mouth at bedtime as needed  Okay to refill Tramadol?

## 2020-07-28 ENCOUNTER — Other Ambulatory Visit: Payer: Self-pay | Admitting: Rheumatology

## 2020-07-28 DIAGNOSIS — M1990 Unspecified osteoarthritis, unspecified site: Secondary | ICD-10-CM

## 2020-08-01 DIAGNOSIS — L570 Actinic keratosis: Secondary | ICD-10-CM | POA: Diagnosis not present

## 2020-08-01 DIAGNOSIS — L812 Freckles: Secondary | ICD-10-CM | POA: Diagnosis not present

## 2020-08-01 DIAGNOSIS — L820 Inflamed seborrheic keratosis: Secondary | ICD-10-CM | POA: Diagnosis not present

## 2020-08-01 DIAGNOSIS — L821 Other seborrheic keratosis: Secondary | ICD-10-CM | POA: Diagnosis not present

## 2020-08-01 DIAGNOSIS — L720 Epidermal cyst: Secondary | ICD-10-CM | POA: Diagnosis not present

## 2020-08-03 ENCOUNTER — Ambulatory Visit (INDEPENDENT_AMBULATORY_CARE_PROVIDER_SITE_OTHER): Payer: Medicare Other

## 2020-08-03 DIAGNOSIS — I420 Dilated cardiomyopathy: Secondary | ICD-10-CM

## 2020-08-03 LAB — CUP PACEART REMOTE DEVICE CHECK
Battery Remaining Longevity: 40 mo
Battery Voltage: 2.96 V
Brady Statistic AP VP Percent: 0.1 %
Brady Statistic AP VS Percent: 0.01 %
Brady Statistic AS VP Percent: 98.62 %
Brady Statistic AS VS Percent: 1.27 %
Brady Statistic RA Percent Paced: 0.11 %
Brady Statistic RV Percent Paced: 55.71 %
Date Time Interrogation Session: 20220317001606
HighPow Impedance: 72 Ohm
Implantable Lead Implant Date: 20181108
Implantable Lead Implant Date: 20181108
Implantable Lead Implant Date: 20181108
Implantable Lead Location: 753858
Implantable Lead Location: 753859
Implantable Lead Location: 753860
Implantable Lead Model: 4298
Implantable Lead Model: 5076
Implantable Pulse Generator Implant Date: 20181108
Lead Channel Impedance Value: 1197 Ohm
Lead Channel Impedance Value: 1197 Ohm
Lead Channel Impedance Value: 1235 Ohm
Lead Channel Impedance Value: 204.14 Ohm
Lead Channel Impedance Value: 216.848
Lead Channel Impedance Value: 270.092
Lead Channel Impedance Value: 278.667
Lead Channel Impedance Value: 302.899
Lead Channel Impedance Value: 399 Ohm
Lead Channel Impedance Value: 418 Ohm
Lead Channel Impedance Value: 456 Ohm
Lead Channel Impedance Value: 475 Ohm
Lead Channel Impedance Value: 532 Ohm
Lead Channel Impedance Value: 589 Ohm
Lead Channel Impedance Value: 608 Ohm
Lead Channel Impedance Value: 703 Ohm
Lead Channel Impedance Value: 779 Ohm
Lead Channel Impedance Value: 836 Ohm
Lead Channel Pacing Threshold Amplitude: 0.5 V
Lead Channel Pacing Threshold Amplitude: 0.5 V
Lead Channel Pacing Threshold Amplitude: 2.75 V
Lead Channel Pacing Threshold Pulse Width: 0.4 ms
Lead Channel Pacing Threshold Pulse Width: 0.4 ms
Lead Channel Pacing Threshold Pulse Width: 1 ms
Lead Channel Sensing Intrinsic Amplitude: 14 mV
Lead Channel Sensing Intrinsic Amplitude: 14 mV
Lead Channel Sensing Intrinsic Amplitude: 2.5 mV
Lead Channel Sensing Intrinsic Amplitude: 2.5 mV
Lead Channel Setting Pacing Amplitude: 2 V
Lead Channel Setting Pacing Amplitude: 2.5 V
Lead Channel Setting Pacing Amplitude: 2.75 V
Lead Channel Setting Pacing Pulse Width: 0.4 ms
Lead Channel Setting Pacing Pulse Width: 1 ms
Lead Channel Setting Sensing Sensitivity: 0.3 mV

## 2020-08-04 ENCOUNTER — Ambulatory Visit (INDEPENDENT_AMBULATORY_CARE_PROVIDER_SITE_OTHER): Payer: Medicare Other

## 2020-08-04 DIAGNOSIS — Z95810 Presence of automatic (implantable) cardiac defibrillator: Secondary | ICD-10-CM | POA: Diagnosis not present

## 2020-08-04 DIAGNOSIS — I5022 Chronic systolic (congestive) heart failure: Secondary | ICD-10-CM

## 2020-08-04 NOTE — Progress Notes (Signed)
EPIC Encounter for ICM Monitoring  Patient Name: [name] is a 75 y.o. male Date: 08/04/2020 Primary Care Physican: Biagio Borg, MD Primary Cardiologist:Cooper Electrophysiologist:Camnitz Bi-V Pacing:98.5% 07/05/2020 Weight:193lbs  Time in AT/AF <0.1 hr/day (<0.1%)   Transmission reviewed.  Optivol thoracic impedancenormal.  Takes Spirolactone 25 mg take 0.5 tablet daily.  Recommendations:No changes.  Follow-up plan: ICM clinic phone appointment on4/18/2022. 91 day device clinic remote transmission6/23/2022.   EP/Cardiology Office Visits:Recall 06/21/2021 with Dr Burt Knack.  Last EP visit 2/22/2022with Dr.Camnitz (no recall for 2023).   Copy of ICM check sent to [facility].  3 month ICM trend: 08/03/2020.    1 Year ICM trend:       [name], RN 08/04/2020 3:38 PM

## 2020-08-11 NOTE — Progress Notes (Signed)
Remote ICD transmission.   

## 2020-08-21 ENCOUNTER — Other Ambulatory Visit: Payer: Self-pay | Admitting: Cardiology

## 2020-08-21 NOTE — Telephone Encounter (Signed)
Pt's age 75, wt 88.6 kg, SCr 0.89, CrCl 88.49, last ov w/ WC 07/11/20.

## 2020-08-26 ENCOUNTER — Other Ambulatory Visit: Payer: Self-pay | Admitting: Rheumatology

## 2020-08-28 NOTE — Telephone Encounter (Signed)
Last Visit: 1/20 Next Visit: 11/09/2020 UDS: 05/22/2020, UDS is consistent with treatment.  Narc Agreement: 06/08/2020  Last Fill: 07/27/2020  Okay to refill Tramadol?

## 2020-09-04 ENCOUNTER — Ambulatory Visit (INDEPENDENT_AMBULATORY_CARE_PROVIDER_SITE_OTHER): Payer: Medicare Other

## 2020-09-04 DIAGNOSIS — Z95810 Presence of automatic (implantable) cardiac defibrillator: Secondary | ICD-10-CM

## 2020-09-04 DIAGNOSIS — I5022 Chronic systolic (congestive) heart failure: Secondary | ICD-10-CM | POA: Diagnosis not present

## 2020-09-05 ENCOUNTER — Encounter: Payer: Self-pay | Admitting: Gastroenterology

## 2020-09-08 NOTE — Progress Notes (Signed)
EPIC Encounter for ICM Monitoring  Patient Name: [name] is a 75 y.o. male Date: 09/08/2020 Primary Care Physican: Biagio Borg, MD Primary Cardiologist:Cooper Electrophysiologist:Camnitz Bi-V Pacing:96.9% 4/22/2022Weight:193lbs  Time in AT/AF          <0.1 hr/day (<0.1%)   Spoke with patient and reports feeling well at this time.  Denies fluid symptoms.  He reports in March he had an episode of feeling very weak that lasted several hours and has not had any since that time.  Advised to send manual transmission if he has any future episodes.    Optivol thoracic impedancenormal.  Takes Spirolactone 25 mg take 0.5 tablet daily.  Recommendations:No changes and encouraged to call if experiencing any fluid symptoms.  Follow-up plan: ICM clinic phone appointment on 10/18/2020. 91 day device clinic remote transmission6/23/2022.   EP/Cardiology Office Visits:Recall 06/21/2021 with Dr Burt Knack.  Last EP visit 2/22/2022with Dr.Camnitz (no recall for 2023).   Copy of ICM check sent to [facility].  3 month ICM trend: 09/04/2020.    1 Year ICM trend:       [name], RN 09/08/2020 4:18 PM

## 2020-09-15 ENCOUNTER — Other Ambulatory Visit: Payer: Self-pay | Admitting: Physician Assistant

## 2020-09-19 DIAGNOSIS — H35033 Hypertensive retinopathy, bilateral: Secondary | ICD-10-CM | POA: Diagnosis not present

## 2020-09-19 DIAGNOSIS — M0569 Rheumatoid arthritis of multiple sites with involvement of other organs and systems: Secondary | ICD-10-CM | POA: Diagnosis not present

## 2020-09-19 DIAGNOSIS — Z79899 Other long term (current) drug therapy: Secondary | ICD-10-CM | POA: Diagnosis not present

## 2020-09-20 ENCOUNTER — Other Ambulatory Visit: Payer: Self-pay | Admitting: Physician Assistant

## 2020-09-20 NOTE — Telephone Encounter (Signed)
Last Visit: 06/08/2020 Next Visit: 11/09/2020 UDS: 05/22/2020, UDS is consistent with treatment.  Narc Agreement: 06/08/2020  Last Fill: 08/28/2020,  tramadol 50mg  1 tablet by mouth at bedtime as needed  Okay to refill Tramadol?

## 2020-09-27 DIAGNOSIS — Z20822 Contact with and (suspected) exposure to covid-19: Secondary | ICD-10-CM | POA: Diagnosis not present

## 2020-10-18 ENCOUNTER — Ambulatory Visit (INDEPENDENT_AMBULATORY_CARE_PROVIDER_SITE_OTHER): Payer: Medicare Other

## 2020-10-18 DIAGNOSIS — I5022 Chronic systolic (congestive) heart failure: Secondary | ICD-10-CM

## 2020-10-18 DIAGNOSIS — Z95810 Presence of automatic (implantable) cardiac defibrillator: Secondary | ICD-10-CM

## 2020-10-18 DIAGNOSIS — H019 Unspecified inflammation of eyelid: Secondary | ICD-10-CM | POA: Diagnosis not present

## 2020-10-18 DIAGNOSIS — H02423 Myogenic ptosis of bilateral eyelids: Secondary | ICD-10-CM | POA: Diagnosis not present

## 2020-10-20 ENCOUNTER — Telehealth: Payer: Self-pay | Admitting: *Deleted

## 2020-10-20 NOTE — Progress Notes (Signed)
EPIC Encounter for ICM Monitoring  Patient Name: [name] is a 75 y.o. male Date: 10/20/2020 Primary Care Physican: Biagio Borg, MD Primary Cardiologist:Cooper Electrophysiologist:Camnitz Bi-V Pacing:97.6% 4/22/2022Weight:193lbs  Time in AT/AF<0.1 hr/day (<0.1%)   Spoke with patient and reports feeling well at this time.  Denies fluid symptoms.  Message sent to office scheduling per patient request.  He would like an appt to discuss if he can have a knee replacement.  Optivol thoracic impedancenormal.  Takes Spirolactone 25 mg take 0.5 tablet daily.  Recommendations:No changes and encouraged to call if experiencing any fluid symptoms.  Follow-up plan: ICM clinic phone appointment on 11/21/2020. 91 day device clinic remote transmission6/23/2022.   EP/Cardiology Office Visits:Recall 06/21/2021 with Dr Burt Knack. Last EP visit 2/22/2022with Dr.Camnitz(no recall for 2023).   Copy of ICM check sent to [facility].  3 month ICM trend: 10/18/2020.    1 Year ICM trend:       [name], RN 10/20/2020 12:20 PM

## 2020-10-20 NOTE — Telephone Encounter (Signed)
I s/w the pt and asked who the surgeon will be so that I may call their office to obtain surgery clearance information. Pt states he has not seen the Orthopedic surgeon yet. States he just wanted to know what Dr. Burt Knack thought and if maybe he might just clear him. I explained to the pt that our office pre op protocol is that we are going to need a clearance form from the Orthopedic Surgeon before  Dr. Burt Knack will clear him. Pt states he will then set up appt with Orthopedic MD first and will let our office know after he has seen ortho. I assured the pt that once he has been seen by ortho, the surgery scheduler will fax over a clearance request to our office, which then our pre op team will review for clearance. Pt thanked me for the call and the help. I will send note back to all involved as FYI only. Our office will await clearance from Orthopedic office.

## 2020-10-20 NOTE — Telephone Encounter (Signed)
-----   Message from Theodoro Parma, RN sent at 10/20/2020 12:56 PM EDT ----- Regarding: FW: Appt with Dr Biagio Borg everyone! Per our office policy, the patient's surgeon will need to send a clearance request to the office to be filled out. He may not even need a visit. I will cc Julaine Hua (our office Preop CMA) in this message so she is aware. Thanks, Valetta Fuller  ----- Message ----- From: Gwendel Hanson Sent: 10/20/2020  12:42 PM EDT To: Theodoro Parma, RN Subject: FW: Appt with Dr Burt Knack                         ----- Message ----- From: [name], RN Sent: 10/20/2020  12:29 PM EDT To: [facility] Scheduling Subject: Appt with Dr Burt Knack                            Pt requested a call to schedule an appointment with Dr Burt Knack to discuss if he can have a knee replacement.  Can you give patient a call?    Thank you, Sharman Cheek, RN Device clinic

## 2020-10-26 NOTE — Progress Notes (Signed)
Office Visit Note  Patient: [name]             Date of Birth: [birth year]           MRN: [id number]             PCP: Biagio Borg, MD Referring: Biagio Borg, MD Visit Date: 11/09/2020 Occupation: @GUAROCC @  Subjective:  Pain in multiple joints..   History of Present Illness: [name] is a 75 y.o. male with a history of rheumatoid arthritis and osteoarthritis.  He states he has been having increased pain and discomfort in his hands and his right first MTP joint.  He had right knee replacement several years ago and he continues to have pain and discomfort in his right knee.  He is also interested in getting left total knee replacement as he is having increased pain in his left knee.  He continues to have lower back pain.  He has seen Dr. Saintclair Halsted in the past and he did not advise surgery.  He has been taking 1 tramadol tablet at bedtime.  He states it reduces his pain level from 8 to 6 on the scale of 0-10.  Activities of Daily Living:  Patient reports morning stiffness for all day. Patient Reports nocturnal pain.  Difficulty dressing/grooming: Denies Difficulty climbing stairs: Reports Difficulty getting out of chair: Reports Difficulty using hands for taps, buttons, cutlery, and/or writing: Reports  Review of Systems  Constitutional:  Positive for fatigue.  HENT:  Positive for mouth dryness and nose dryness. Negative for mouth sores.   Eyes:  Positive for dryness. Negative for pain and itching.  Respiratory:  Positive for shortness of breath and difficulty breathing.   Cardiovascular:  Negative for chest pain and palpitations.  Gastrointestinal:  Negative for blood in stool, constipation and diarrhea.  Endocrine: Negative for increased urination.  Genitourinary:  Negative for difficulty urinating.  Musculoskeletal:  Positive for joint pain, joint pain, joint swelling, myalgias, morning stiffness, muscle tenderness and myalgias.  Skin:  Negative for color change, rash  and redness.  Allergic/Immunologic: Negative for susceptible to infections.  Neurological:  Positive for weakness. Negative for dizziness, numbness, headaches and memory loss.  Hematological:  Negative for bruising/bleeding tendency.  Psychiatric/Behavioral:  Negative for confusion.    PMFS History:  Patient Active Problem List   Diagnosis Date Noted   Axillary abscess 06/26/2020   Chronic diarrhea 06/26/2020   Fever 01/18/2020   Pericarditis 07/06/2019   Right hip pain 07/06/2019   Acute chest pain 01/24/2019   Prediabetes 12/24/2018   Rheumatoid arthritis ([place]) 12/24/2018   Preventative health care 06/25/2018   Incarcerated ventral hernia 03/20/2018   DCM (dilated cardiomyopathy) ([place]) 69/67/8938   Chronic systolic heart failure ([place]) 01/15/2017   History of pneumonia 10/23/2016   Osteoarthritis 10/23/2016   Primary osteoarthritis of both feet 10/04/2016   Primary osteoarthritis of left knee 10/04/2016   DDD (degenerative disc disease), lumbar 10/04/2016   Hemoptysis 05/27/2016   Overweight (BMI 25.0-29.9) 03/14/2016   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   Parotid mass 08/23/2015   Dyspnea 11/29/2014   Synovial cyst of lumbar facet joint 09/26/2014   Incisional hernia, without obstruction or gangrene 12/06/2013   Nausea alone 08/27/2013   Colon cancer screening 08/27/2013   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   Benign prostatic hyperplasia with urinary obstruction 06/18/2010   Other specified disorder of stomach and duodenum 12/16/2008   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 11/15/2008   Gastroesophageal reflux disease 09/02/2008  COLONIC POLYPS, HYPERPLASTIC 05/24/2007   Obstructive sleep apnea 05/24/2007   ALLERGIC RHINITIS 05/24/2007   ESOPHAGEAL STRICTURE 05/24/2007   HIATAL HERNIA 05/24/2007   Hyperlipidemia 02/06/2007   Hypertensive disorder 02/06/2007   CAD (coronary artery disease) 02/06/2007   Primary osteoarthritis of both hands 02/06/2007   Neck pain on  right side 02/06/2007    Past Medical History:  Diagnosis Date   AICD (automatic cardioverter/defibrillator) present    Allergic rhinitis    Allergy    Anginal pain ([place]) 05/26/14   chest pain after chasing dog   Atypical nevus of back 04/27/2003   moderate - mid lower back   Basal cell carcinoma 01/26/2008   right cheek - MOHs   CAD (coronary artery disease)    hx of stent- 2005 RCA   Cataract    beginning stage both eyes   CHF (congestive heart failure) (HCC)    pacemaker [employer]     Chronic back pain    intermittent   Constipation    Dizziness    Dysrhythmia    right bundle branch block    Esophageal stricture    GERD (gastroesophageal reflux disease)    Hemoptysis    Hiatal hernia    History of colonic polyps    hyperplastic   HTN (hypertension)    Hyperlipidemia    Hypertrophy of prostate with urinary obstruction and other lower urinary tract symptoms (LUTS)    OA (osteoarthritis)    OSA (obstructive sleep apnea)    cpap- 10    Other specified disorder of stomach and duodenum    duodenal periampulary tubulovillous adenoma removed by Dr. Ardis Hughs 5/10   Pericarditis 07/06/2019   Pneumonia    Pre-diabetes    no medications   Shortness of breath    with exertion   Sleep apnea    cpap   Testicular hypofunction     Family History  Problem Relation Age of Onset   Coronary artery disease Father    Diabetes Father    Sudden death Father        due to heart disease   Heart disease Father    Heart disease Mother    Hypertension Other    Stomach cancer Paternal Grandmother    COPD Brother    Arthritis Brother    Heart failure Brother    Cancer Brother        lung    Arthritis Daughter    Colon cancer Neg Hx    Esophageal cancer Neg Hx    Colon polyps Neg Hx    Ulcerative colitis Neg Hx    Past Surgical History:  Procedure Laterality Date   BIV ICD INSERTION CRT-D N/A 03/27/2017   Procedure: BIV ICD INSERTION CRT-D;  Surgeon: Constance Haw, MD;   Location: [place] [facility];  Service: Cardiovascular;  Laterality: N/A;   CARDIAC CATHETERIZATION     '05, last 2009, showing patent RCA stent   COLONOSCOPY  12/2007   HYPERPLASTIC POLYP   COLONOSCOPY  12/2019   COLONOSCOPY WITH PROPOFOL N/A 06/02/2014   Procedure: COLONOSCOPY WITH PROPOFOL;  Surgeon: [name], MD;  Location: [facility];  Service: Endoscopy;  Laterality: N/A;   CORONARY ANGIOPLASTY  08/2003   ESOPHAGOGASTRODUODENOSCOPY (EGD) WITH PROPOFOL N/A 06/02/2014   Procedure: ESOPHAGOGASTRODUODENOSCOPY (EGD) WITH PROPOFOL;  Surgeon: [name], MD;  Location: [facility];  Service: Endoscopy;  Laterality: N/A;   hernia surgery x 3     Bilateral Inguinal, Umbicial   INSERTION  OF MESH N/A 03/20/2018   Procedure: INSERTION OF MESH;  Surgeon: Alphonsa Overall, MD;  Location: [place];  Service: General;  Laterality: N/A;   IRRIGATION AND DEBRIDEMENT ABSCESS Left 08/14/2012   Procedure: IRRIGATION AND DEBRIDEMENT LEFT INGUINAL BOIL ;  Surgeon: Ailene Rud, MD;  Location: [facility];  Service: Urology;  Laterality: Left;   JOINT REPLACEMENT Right 2017   KNEE ARTHROPLASTY     LEFT HEART CATH AND CORONARY ANGIOGRAPHY N/A 01/24/2019   Procedure: LEFT HEART CATH AND CORONARY ANGIOGRAPHY;  Surgeon: Martinique, Peter M, MD;  Location: [place] [facility];  Service: Cardiovascular;  Laterality: N/A;   LEFT HEART CATHETERIZATION WITH CORONARY ANGIOGRAM N/A 05/26/2014   Procedure: LEFT HEART CATHETERIZATION WITH CORONARY ANGIOGRAM;  Surgeon: Blane Ohara, MD;  Location: [facility];  Service: Cardiovascular;  Laterality: N/A;   LUMBAR LAMINECTOMY/DECOMPRESSION MICRODISCECTOMY Left 09/26/2014   Procedure: Lumbar Laminectomy for resection of synovial cyst  Lumbar five- sacral one left;  Surgeon: Kary Kos, MD;  Location: [place] [facility];  Service: Neurosurgery;  Laterality: Left;   MOLE REMOVAL Left 03/20/2018   Procedure: MOLE REMOVAL;  Surgeon: Alphonsa Overall, MD;  Location: [facility];  Service: General;   Laterality: Left;   Oral surg to removed growth from ?sinus  10/2010   ?Dermoid removed by DrRiggs   PACEMAKER INSERTION  04/03/2017   POLYPECTOMY     RCA stenting     '05 RCA   right toe surgery  Right    Cyst    RIGHT/LEFT HEART CATH AND CORONARY ANGIOGRAPHY N/A 01/15/2017   Procedure: RIGHT/LEFT HEART CATH AND CORONARY ANGIOGRAPHY;  Surgeon: [name], MD;  Location: [place] [facility];  Service: Cardiovascular;  Laterality: N/A;   s/p right knee arthroscopy  2005   thumb surgery Right    TOTAL KNEE ARTHROPLASTY Right 03/12/2016   Procedure: RIGHT TOTAL KNEE ARTHROPLASTY;  Surgeon: Paralee Cancel, MD;  Location: [facility];  Service: Orthopedics;  Laterality: Right;   UPPER GASTROINTESTINAL ENDOSCOPY     VENTRAL HERNIA REPAIR N/A 03/20/2018   Procedure: LAPAROSCOPIC VENTRAL INCISIONAL  HERNIA ERAS PATHWAY;  Surgeon: Alphonsa Overall, MD;  Location: [place];  Service: General;  Laterality: N/A;   Social History   Social History Narrative   Not on file   Immunization History  Administered Date(s) Administered   Fluad Quad(high Dose 65+) 02/18/2019   Influenza Split 03/21/2011   Influenza Whole 04/06/2009, 04/09/2010   Influenza, High Dose Seasonal PF 04/20/2015, 04/15/2016, 02/21/2017   Influenza,inj,Quad PF,6+ Mos 03/24/2014   Influenza-Unspecified 02/26/2018, 02/18/2019, 02/26/2020   PFIZER(Purple Top)SARS-COV-2 Vaccination 05/27/2019, 06/17/2019, 02/15/2020   Pneumococcal Conjugate-13 11/29/2014   Pneumococcal Polysaccharide-23 06/25/2018   Tdap 11/29/2014   Zoster Recombinat (Shingrix) 06/07/2018, 09/26/2018   Zoster, Live 05/20/2006     Objective: Vital Signs: BP 128/76 (BP Location: Left Arm, Patient Position: Sitting, Cuff Size: Normal)   Pulse 80   Resp 15   Ht 5\' 8"  (1.727 m)   Wt 196 lb 6.4 oz (89.1 kg)   BMI 29.86 kg/m    Physical Exam Vitals and nursing note reviewed.  Constitutional:      Appearance: He is well-developed.  HENT:     Head: Normocephalic and  atraumatic.  Eyes:     Conjunctiva/sclera: Conjunctivae normal.     Pupils: Pupils are equal, round, and reactive to light.  Cardiovascular:     Rate and Rhythm: Normal rate and regular rhythm.     Heart sounds: Normal heart sounds.  Pulmonary:     Effort: Pulmonary effort is normal.     Breath sounds: Normal breath sounds.  Abdominal:     General: Bowel sounds are normal.     Palpations: Abdomen is soft.  Musculoskeletal:     Cervical back: Normal range of motion and neck supple.  Skin:    General: Skin is warm and dry.     Capillary Refill: Capillary refill takes less than 2 seconds.  Neurological:     Mental Status: He is alert and oriented to person, place, and time.  Psychiatric:        Behavior: Behavior normal.     Musculoskeletal Exam: He has limited range of motion of the cervical and lumbar spine.  Shoulder joints and elbow joints with good range of motion.  He had no tenderness or swelling over wrist joints.  He has synovial thickening over bilateral MCP joints.  He has bilateral PIP and DIP thickening and subluxation with no synovitis.  Hip joints were in good range of motion.  Right knee joint is replaced with some warmth without effusion.  Left knee joint was in good range of motion without any warmth swelling or effusion.  He has overcrowding of chills with first MTP thickening and PIP and DIP thickening.  He had no synovitis over right first MTP joint.  CDAI Exam: CDAI Score: 0.6  Patient Global: 4 mm; Provider Global: 2 mm Swollen: 0 ; Tender: 0  Joint Exam 11/09/2020   No joint exam has been documented for this visit   There is currently no information documented on the homunculus. Go to the Rheumatology activity and complete the homunculus joint exam.  Investigation: No additional findings.  Imaging: CUP PACEART REMOTE DEVICE CHECK  Result Date: 11/02/2020 Scheduled remote reviewed. Normal device function.  Next remote 91 days. Kathy Breach, RN, CCDS, CV  [employer]   Recent Labs: Lab Results  Component Value Date   WBC 6.2 05/22/2020   HGB 14.5 05/22/2020   PLT 242 05/22/2020   NA 139 05/22/2020   K 4.4 05/22/2020   CL 106 05/22/2020   CO2 28 05/22/2020   GLUCOSE 116 (H) 05/22/2020   BUN 22 05/22/2020   CREATININE 0.89 05/22/2020   BILITOT 1.0 05/22/2020   ALKPHOS 67 03/20/2020   AST 18 05/22/2020   ALT 19 05/22/2020   PROT 6.6 05/22/2020   ALBUMIN 4.5 03/20/2020   CALCIUM 9.6 05/22/2020   GFRAA 96 05/22/2020    Speciality Comments: PLQ eye exam: 03/09/2020 normal. [facility] Follow up in 1 year.  Procedures:  No procedures performed Allergies: Sulfonamide derivatives   Assessment / Plan:     Visit Diagnoses: Rheumatoid arthritis involving multiple sites with positive rheumatoid factor (HCC) - CCP +, RF -, 14-3-3 eta negative, Sed rate WNL.  He complains of pain and discomfort in most of his joints.  No synovitis was noted on the examination.  High risk medication use - PLQ 200 mg 1 tablet po qd. PLQ eye exam: 03/09/2020 - Plan: CBC with Differential/Platelet, COMPLETE METABOLIC PANEL WITH GFR today and then in 5 months.  We will get eye examination in October.  Recommendations regarding immunization were given.  Medication monitoring encounter - tramadol 50mg  1 tablet by mouth at bedtime as needed. UDS: 05/22/2020, narcotic agreement: 06/08/2020. - Plan: DRUG MONITOR, TRAMADOL,QN, URINE, DRUG MONITOR, PANEL 5, W/CONF, URINE  Primary osteoarthritis of both hands-he has severe osteoarthritis in his hands with DIP and PIP subluxation.  Joint  protection muscle strengthening was discussed.  Primary osteoarthritis of left knee-he has been having increased left knee joint.  He is planning to get a total knee replacement in the future.  Status post right knee replacement - he had right knee replacement by Dr. Alvan Dame in the past.  He states he continues to have discomfort in his left knee.  No warmth swelling or effusion  was noted.  He has some warmth in his left knee joint and good extension.  Primary osteoarthritis of both feet-he has severe osteoarthritis in his feet.  No synovitis was noted.  He has first MTP discomfort.  I advised him to use shoes wider toebox and padded socks.  DDD (degenerative disc disease), lumbar-he continues to have lower back pain.  He states he has seen Dr. Saintclair Halsted in the past who did not advise surgery.  A handout on back exercises was given.  Core strengthening exercises were discussed.  Other medical problems are listed as follows:  History of coronary artery disease - followed by cardiologist.  History of hyperlipidemia  Pacemaker  History of hypertension  History of CHF (congestive heart failure)  History of gastric polyp  History of colon polyps  History of gastroesophageal reflux (GERD)  History of BPH  History of sleep apnea  Inflammatory arthritis - Plan: hydroxychloroquine (PLAQUENIL) 200 MG tablet  Orders: Orders Placed This Encounter  Procedures   CBC with Differential/Platelet   COMPLETE METABOLIC PANEL WITH GFR   DRUG MONITOR, TRAMADOL,QN, URINE   DRUG MONITOR, PANEL 5, W/CONF, URINE    Meds ordered this encounter  Medications   hydroxychloroquine (PLAQUENIL) 200 MG tablet    Sig: Take 1 tablet (200 mg total) by mouth daily.    Dispense:  90 tablet    Refill:  0     Follow-Up Instructions: Return in about 5 months (around 04/11/2021) for Rheumatoid arthritis, Osteoarthritis.   Bo Merino, MD  Note - This record has been created using Editor, commissioning.  Chart creation errors have been sought, but may not always  have been located. Such creation errors do not reflect on  the standard of medical care.

## 2020-10-27 ENCOUNTER — Other Ambulatory Visit: Payer: Self-pay | Admitting: Rheumatology

## 2020-10-30 NOTE — Telephone Encounter (Signed)
Last Visit: 06/08/2020 Next Visit: 11/09/2020 UDS: 05/22/2020, UDS is consistent with treatment.  Narc Agreement: 06/08/2020   Last Fill: 09/21/2020,  tramadol 50mg  1 tablet by mouth at bedtime as needed   Okay to refill Tramadol?

## 2020-10-31 DIAGNOSIS — H04129 Dry eye syndrome of unspecified lacrimal gland: Secondary | ICD-10-CM | POA: Diagnosis not present

## 2020-10-31 DIAGNOSIS — Z79899 Other long term (current) drug therapy: Secondary | ICD-10-CM | POA: Diagnosis not present

## 2020-10-31 DIAGNOSIS — M0569 Rheumatoid arthritis of multiple sites with involvement of other organs and systems: Secondary | ICD-10-CM | POA: Diagnosis not present

## 2020-11-02 ENCOUNTER — Ambulatory Visit (INDEPENDENT_AMBULATORY_CARE_PROVIDER_SITE_OTHER): Payer: Medicare Other

## 2020-11-02 DIAGNOSIS — I420 Dilated cardiomyopathy: Secondary | ICD-10-CM

## 2020-11-02 DIAGNOSIS — I5022 Chronic systolic (congestive) heart failure: Secondary | ICD-10-CM

## 2020-11-02 LAB — CUP PACEART REMOTE DEVICE CHECK
Battery Remaining Longevity: 37 mo
Battery Voltage: 2.96 V
Brady Statistic AP VP Percent: 0.24 %
Brady Statistic AP VS Percent: 0.01 %
Brady Statistic AS VP Percent: 97.98 %
Brady Statistic AS VS Percent: 1.76 %
Brady Statistic RA Percent Paced: 0.25 %
Brady Statistic RV Percent Paced: 41.53 %
Date Time Interrogation Session: 20220616001804
HighPow Impedance: 68 Ohm
Implantable Lead Implant Date: 20181108
Implantable Lead Implant Date: 20181108
Implantable Lead Implant Date: 20181108
Implantable Lead Location: 753858
Implantable Lead Location: 753859
Implantable Lead Location: 753860
Implantable Lead Model: 4298
Implantable Lead Model: 5076
Implantable Pulse Generator Implant Date: 20181108
Lead Channel Impedance Value: 1083 Ohm
Lead Channel Impedance Value: 1121 Ohm
Lead Channel Impedance Value: 1178 Ohm
Lead Channel Impedance Value: 204.14 Ohm
Lead Channel Impedance Value: 216.848
Lead Channel Impedance Value: 263.855
Lead Channel Impedance Value: 272.032
Lead Channel Impedance Value: 295.076
Lead Channel Impedance Value: 399 Ohm
Lead Channel Impedance Value: 418 Ohm
Lead Channel Impedance Value: 418 Ohm
Lead Channel Impedance Value: 475 Ohm
Lead Channel Impedance Value: 551 Ohm
Lead Channel Impedance Value: 608 Ohm
Lead Channel Impedance Value: 646 Ohm
Lead Channel Impedance Value: 665 Ohm
Lead Channel Impedance Value: 722 Ohm
Lead Channel Impedance Value: 779 Ohm
Lead Channel Pacing Threshold Amplitude: 0.5 V
Lead Channel Pacing Threshold Amplitude: 0.625 V
Lead Channel Pacing Threshold Amplitude: 3 V
Lead Channel Pacing Threshold Pulse Width: 0.4 ms
Lead Channel Pacing Threshold Pulse Width: 0.4 ms
Lead Channel Pacing Threshold Pulse Width: 1 ms
Lead Channel Sensing Intrinsic Amplitude: 15 mV
Lead Channel Sensing Intrinsic Amplitude: 15 mV
Lead Channel Sensing Intrinsic Amplitude: 2.625 mV
Lead Channel Sensing Intrinsic Amplitude: 2.625 mV
Lead Channel Setting Pacing Amplitude: 2 V
Lead Channel Setting Pacing Amplitude: 2.5 V
Lead Channel Setting Pacing Amplitude: 2.75 V
Lead Channel Setting Pacing Pulse Width: 0.4 ms
Lead Channel Setting Pacing Pulse Width: 1 ms
Lead Channel Setting Sensing Sensitivity: 0.3 mV

## 2020-11-06 ENCOUNTER — Ambulatory Visit: Payer: Medicare Other | Admitting: Gastroenterology

## 2020-11-08 ENCOUNTER — Encounter: Payer: Self-pay | Admitting: Gastroenterology

## 2020-11-08 ENCOUNTER — Ambulatory Visit (INDEPENDENT_AMBULATORY_CARE_PROVIDER_SITE_OTHER): Payer: Medicare Other | Admitting: Gastroenterology

## 2020-11-08 ENCOUNTER — Telehealth: Payer: Self-pay

## 2020-11-08 VITALS — BP 134/70 | HR 80 | Ht 67.0 in | Wt 196.4 lb

## 2020-11-08 DIAGNOSIS — K317 Polyp of stomach and duodenum: Secondary | ICD-10-CM | POA: Diagnosis not present

## 2020-11-08 DIAGNOSIS — K529 Noninfective gastroenteritis and colitis, unspecified: Secondary | ICD-10-CM | POA: Diagnosis not present

## 2020-11-08 DIAGNOSIS — I2510 Atherosclerotic heart disease of native coronary artery without angina pectoris: Secondary | ICD-10-CM | POA: Diagnosis not present

## 2020-11-08 NOTE — Progress Notes (Signed)
Review of pertinent gastrointestinal problems: 1.  Routine risk for colon cancer.  Colonoscopy Dr. Ardis Hughs January 2016 was completely normal.  Colonoscopy 2009 Dr. Verl Blalock removed a single hyperplastic polyp. 2. incidentally noted duodenal polyp partially  resected endoscopically TVA (09/2008) (Dr. Henrene Pastor),  repeat procedure December 2012 found small residual  tubular adenoma at the site, appeared to be  completely removed endoscopically, 2012 repeat EGD  with small residual adenoma at the site removed.  repeat EGD 2016 with small adenoma at the site removed.  EGD Dr. Ardis Hughs March 2019 found small amount of apparent recurrent polypoid growth removed with biopsy forceps, this was tubular adenoma on pathology.  I recommended repeat EGD at 3-year interval 3.  Diarrheal illness.  2021.  GI pathogen panel negative.  Eventual colonoscopy August 2021 showed completely normal terminal ileum and also completely normal-appearing colon.  Biopsies were taken for microscopic colitis and they were all normal.  He was recommended to start taking a single Imodium every morning after waking.  CT scan abdomen pelvis with IV and oral contrast November 2021 showed no explanation for his abdominal pain, weight loss.  It did show a small left groin hernia that contained "a small knuckle of anterior urinary bladder"   HPI: This is a pleasant 75 year old man   I last saw him here in the office November 2021.  This was for diarrhea and unintentional weight loss.  I recommended blood work, stool testing as well as imaging tests.  We recommended that he follow-up with his urologist.  His blood test and stool tests were all essentially normal.  I recommend a trial of Creon 24,002 pills with every meal and 1 pill with every snack.  I have asked that he return to see me in 6 to 8 weeks to discuss his response.  We have not heard from him since then.  Today he tells me that he never tried the Creon.   2 months ago I reviewed  his EGD recall for duodenal polyp.  I recommended that he follow-up.  The office given his age 62 rather than simply showing up for "direct surveillance endoscopy"  His weight is up 13 pounds since his last office visit here 7 months ago.  Today he tells me that he has 2-3 semiformed stools daily and he is pretty content with this.  He never sees blood in his stool.  He has no issues with nausea or vomiting, no significant abdominal pains.  He is on Eliquis for coronary artery disease, congestive heart failure.  His recent ejection fraction was 50 to 55% on echocardiogram February 2022.  ROS: complete GI ROS as described in HPI, all other review negative.  Constitutional:  No unintentional weight loss   Past Medical History:  Diagnosis Date   AICD (automatic cardioverter/defibrillator) present    Allergic rhinitis    Allergy    Anginal pain ([place]) 05/26/14   chest pain after chasing dog   Atypical nevus of back 04/27/2003   moderate - mid lower back   Basal cell carcinoma 01/26/2008   right cheek - MOHs   CAD (coronary artery disease)    hx of stent- 2005 RCA   Cataract    beginning stage both eyes   CHF (congestive heart failure) (HCC)    pacemaker Medtronic     Chronic back pain    intermittent   Constipation    Dizziness    Dysrhythmia    right bundle branch block    Esophageal  stricture    GERD (gastroesophageal reflux disease)    Hemoptysis    Hiatal hernia    History of colonic polyps    hyperplastic   HTN (hypertension)    Hyperlipidemia    Hypertrophy of prostate with urinary obstruction and other lower urinary tract symptoms (LUTS)    OA (osteoarthritis)    OSA (obstructive sleep apnea)    cpap- 10    Other specified disorder of stomach and duodenum    duodenal periampulary tubulovillous adenoma removed by Dr. Ardis Hughs 5/10   Pericarditis 07/06/2019   Pneumonia    Pre-diabetes    no medications   Shortness of breath    with exertion   Sleep apnea    cpap    Testicular hypofunction     Past Surgical History:  Procedure Laterality Date   BIV ICD INSERTION CRT-D N/A 03/27/2017   Procedure: BIV ICD INSERTION CRT-D;  Surgeon: Constance Haw, MD;  Location: [facility];  Service: Cardiovascular;  Laterality: N/A;   CARDIAC CATHETERIZATION     '05, last 2009, showing patent RCA stent   COLONOSCOPY  12/2007   HYPERPLASTIC POLYP   COLONOSCOPY  12/2019   COLONOSCOPY WITH PROPOFOL N/A 06/02/2014   Procedure: COLONOSCOPY WITH PROPOFOL;  Surgeon: [name], MD;  Location: [facility];  Service: Endoscopy;  Laterality: N/A;   CORONARY ANGIOPLASTY  08/2003   ESOPHAGOGASTRODUODENOSCOPY (EGD) WITH PROPOFOL N/A 06/02/2014   Procedure: ESOPHAGOGASTRODUODENOSCOPY (EGD) WITH PROPOFOL;  Surgeon: [name], MD;  Location: [facility];  Service: Endoscopy;  Laterality: N/A;   hernia surgery x 3     Bilateral Inguinal, Umbicial   INSERTION OF MESH N/A 03/20/2018   Procedure: INSERTION OF MESH;  Surgeon: Alphonsa Overall, MD;  Location: [facility];  Service: General;  Laterality: N/A;   IRRIGATION AND DEBRIDEMENT ABSCESS Left 08/14/2012   Procedure: IRRIGATION AND DEBRIDEMENT LEFT INGUINAL BOIL ;  Surgeon: Ailene Rud, MD;  Location: [facility];  Service: Urology;  Laterality: Left;   JOINT REPLACEMENT Right 2017   KNEE ARTHROPLASTY     LEFT HEART CATH AND CORONARY ANGIOGRAPHY N/A 01/24/2019   Procedure: LEFT HEART CATH AND CORONARY ANGIOGRAPHY;  Surgeon: Martinique, Peter M, MD;  Location: [facility];  Service: Cardiovascular;  Laterality: N/A;   LEFT HEART CATHETERIZATION WITH CORONARY ANGIOGRAM N/A 05/26/2014   Procedure: LEFT HEART CATHETERIZATION WITH CORONARY ANGIOGRAM;  Surgeon: Blane Ohara, MD;  Location: [facility];  Service: Cardiovascular;  Laterality: N/A;   LUMBAR LAMINECTOMY/DECOMPRESSION MICRODISCECTOMY Left 09/26/2014   Procedure: Lumbar Laminectomy for resection of synovial cyst  Lumbar five- sacral one left;  Surgeon: Kary Kos, MD;  Location: [facility];  Service: Neurosurgery;  Laterality: Left;   MOLE REMOVAL Left 03/20/2018   Procedure: MOLE REMOVAL;  Surgeon: Alphonsa Overall, MD;  Location: [facility];  Service: General;  Laterality: Left;   Oral surg to removed growth from ?sinus  10/2010   ?Dermoid removed by DrRiggs   PACEMAKER INSERTION  04/03/2017   POLYPECTOMY     RCA stenting     '05 RCA   right toe surgery  Right    Cyst    RIGHT/LEFT HEART CATH AND CORONARY ANGIOGRAPHY N/A 01/15/2017   Procedure: RIGHT/LEFT HEART CATH AND CORONARY ANGIOGRAPHY;  Surgeon: [name], MD;  Location: [facility];  Service: Cardiovascular;  Laterality: N/A;   s/p right knee arthroscopy  2005   thumb surgery Right    TOTAL KNEE ARTHROPLASTY  Right 03/12/2016   Procedure: RIGHT TOTAL KNEE ARTHROPLASTY;  Surgeon: Paralee Cancel, MD;  Location: [facility];  Service: Orthopedics;  Laterality: Right;   UPPER GASTROINTESTINAL ENDOSCOPY     VENTRAL HERNIA REPAIR N/A 03/20/2018   Procedure: [place];  Surgeon: Alphonsa Overall, MD;  Location: [facility];  Service: General;  Laterality: N/A;    Current Outpatient Medications  Medication Sig Dispense Refill   acetaminophen (TYLENOL) 500 MG tablet Take 500 mg by mouth 2 (two) times daily.     atorvastatin (LIPITOR) 20 MG tablet TAKE 1 TABLET BY MOUTH DAILY 90 tablet 3   b complex vitamins tablet Take 1 tablet by mouth daily with lunch.     carvedilol (COREG) 25 MG tablet TAKE 1 TABLET BY MOUTH 2 TIMES DAILY. GENERIC EQUIVALENT FOR COREG. 180 tablet 3   CINNAMON PO Take 1 tablet by mouth daily with lunch.      Coenzyme Q10 (CO Q 10) 100 MG CAPS Take 1 capsule by mouth daily with lunch.     diclofenac sodium (VOLTAREN) 1 % GEL Apply 1 application topically 4 (four) times daily as needed (pain).   3   ELIQUIS 5 MG TABS tablet TAKE 1 TABLET BY MOUTH TWICE DAILY 180 tablet 1   ENTRESTO 49-51 MG TAKE 1 TABLET BY MOUTH TWICE DAILY 856 tablet 3    folic acid (FOLVITE) 314 MCG tablet Take 400 mcg by mouth daily.     Glucosamine HCl (GLUCOSAMINE PO) Take 1,500 mg by mouth every evening.     hydroCHLOROthiazide (MICROZIDE PO) hydrochlorothiazide     hydrocortisone 2.5 % cream Apply 1 application topically 2 (two) times daily as needed (rash on nose).      hydroxychloroquine (PLAQUENIL) 200 MG tablet Take 1 tablet (200 mg total) by mouth daily. 90 tablet 0   Multiple Vitamin (MULTIVITAMIN) capsule Take 1 capsule by mouth daily.       Multiple Vitamins-Minerals (OCUVITE PO) Take 1 tablet by mouth at bedtime.     nitroGLYCERIN (NITROSTAT) 0.4 MG SL tablet Place 1 tablet (0.4 mg total) under the tongue every 5 (five) minutes x 3 doses as needed for chest pain. 25 tablet 0   NON FORMULARY CPAP machine with sleep.     pantoprazole (PROTONIX) 40 MG tablet TAKE 1 TABLET BY MOUTH EVERY DAY BEFORE BREAKFAST 90 tablet 3   Probiotic Product (PROBIOTIC DAILY PO) Take 1 capsule by mouth daily with lunch.      Saw Palmetto, Serenoa repens, (SAW PALMETTO PO) Take 1 capsule by mouth every evening.     spironolactone (ALDACTONE) 25 MG tablet TAKE 1/2 TABLET(12.5 MG) BY MOUTH DAILY 45 tablet 1   tamsulosin (FLOMAX) 0.4 MG CAPS capsule Take 0.4 mg by mouth as needed.     Testosterone 20 % CREA Apply 2 mLs topically daily. Rub on shoulder     traMADol (ULTRAM) 50 MG tablet TAKE 1 TABLET(50 MG) BY MOUTH AT BEDTIME AS NEEDED 30 tablet 0   TURMERIC PO Take 1 tablet by mouth daily with lunch.      No current facility-administered medications for this visit.    Allergies as of 11/08/2020 - Review Complete 11/08/2020  Allergen Reaction Noted   Sulfonamide derivatives Rash 06/26/2010    Family History  Problem Relation Age of Onset   Coronary artery disease Father    Diabetes Father    Sudden death Father        due to heart disease  Heart disease Father    Heart disease Mother    Hypertension Other    Stomach cancer Paternal Grandmother    COPD Brother     Arthritis Brother    Heart failure Brother    Cancer Brother        lung    Arthritis Daughter    Colon cancer Neg Hx    Esophageal cancer Neg Hx    Colon polyps Neg Hx    Ulcerative colitis Neg Hx     Social History   Socioeconomic History   Marital status: Married    Spouse name: Not on file   Number of children: 1   Years of education: Not on file   Highest education level: Not on file  Occupational History   Occupation: Lobbyist: [employer]  Tobacco Use   Smoking status: Former    Pack years: 0.00    Types: Cigarettes    Quit date: 05/20/1990    Years since quitting: 30.4   Smokeless tobacco: Never   Tobacco comments:    previous 30 pack year history  Vaping Use   Vaping Use: Never used  Substance and Sexual Activity   Alcohol use: Yes    Comment: rare   Drug use: Never   Sexual activity: Not on file  Other Topics Concern   Not on file  Social History Narrative   Not on file   Social Determinants of Health   Financial Resource Strain: Not on file  Food Insecurity: Not on file  Transportation Needs: Not on file  Physical Activity: Not on file  Stress: Not on file  Social Connections: Not on file  Intimate Partner Violence: Not on file     Physical Exam: BP 134/70   Pulse 80   Ht 5\' 7"  (1.702 m)   Wt 196 lb 6.4 oz (89.1 kg)   BMI 30.76 kg/m  Constitutional: generally well-appearing Psychiatric: alert and oriented x3 Abdomen: soft, nontender, nondistended, no obvious ascites, no peritoneal signs, normal bowel sounds No peripheral edema noted in lower extremities  Assessment and plan: 75 y.o. male with recurrent duodenal polyp, congestive heart failure, on blood thinner for coronary artery disease.  His CHF seems quite mild with an ejection fraction between 50 and 55%.  He is quite vibrant and active.  We discussed repeating EGD for duodenal polyp surveillance.  He understands that since he is on a blood thinner he  would be at increased risk for procedure related complications especially bleeding and so I asked that he hold his Eliquis for 2 days prior to the procedure.  We will reach out to his cardiologist to make sure that they agree with that recommendation.   Please see the "Patient Instructions" section for addition details about the plan.  Owens Loffler, MD [facility] 11/08/2020, 3:20 PM   Total time on date of encounter was 35 minutes (this included time spent preparing to see the patient reviewing records; obtaining and/or reviewing separately obtained history; performing a medically appropriate exam and/or evaluation; counseling and educating the patient and family if present; ordering medications, tests or procedures if applicable; and documenting clinical information in the health record).

## 2020-11-08 NOTE — Patient Instructions (Addendum)
If you are age 77 or older, your body mass index should be between 23-30. Your Body mass index is 30.76 kg/m. If this is out of the aforementioned range listed, please consider follow up with your Primary Care Provider. _________________________________________________________ The [facility] providers would like to encourage you to use [facility] to communicate with providers for non-urgent requests or questions.  Due to long hold times on the telephone, sending your provider a message by [facility] may be a faster and more efficient way to get a response.  Please allow 48 business hours for a response.  Please remember that this is for non-urgent requests.  _________________________________________________________ Dennis Bast have been scheduled for an endoscopy. Please follow written instructions given to you at your visit today. If you use inhalers (even only as needed), please bring them with you on the day of your procedure.  Please purchase the following medications over the counter and take as directed:  START: imodium take 0.5 tablet daily  Due to recent changes in healthcare laws, you may see the results of your imaging and laboratory studies on MyChart before your provider has had a chance to review them.  We understand that in some cases there may be results that are confusing or concerning to you. Not all laboratory results come back in the same time frame and the provider may be waiting for multiple results in order to interpret others.  Please give [place] 48 hours in order for your provider to thoroughly review all the results before contacting the office for clarification of your results.   Thank you for entrusting me with your care and choosing [facility].  Dr Ardis Hughs

## 2020-11-08 NOTE — Telephone Encounter (Signed)
[facility] Pre-operative Risk Assessment     Request for surgical clearance:     Endoscopy Procedure  What type of surgery is being performed?     endoscopy  When is this surgery scheduled?     12-26-2020  What type of clearance is required ?   Pharmacy  Are there any medications that need to be held prior to surgery and how long? Eliquis x 2 days  Practice name and name of physician performing surgery?      [facility]  What is your office phone and fax number?      Phone- [phone]  [phone]  Anesthesia type (None, local, MAC, general) ?       MAC

## 2020-11-09 ENCOUNTER — Other Ambulatory Visit: Payer: Self-pay

## 2020-11-09 ENCOUNTER — Encounter: Payer: Self-pay | Admitting: Rheumatology

## 2020-11-09 ENCOUNTER — Ambulatory Visit (INDEPENDENT_AMBULATORY_CARE_PROVIDER_SITE_OTHER): Payer: Medicare Other | Admitting: Rheumatology

## 2020-11-09 VITALS — BP 128/76 | HR 80 | Resp 15 | Ht 68.0 in | Wt 196.4 lb

## 2020-11-09 DIAGNOSIS — M0579 Rheumatoid arthritis with rheumatoid factor of multiple sites without organ or systems involvement: Secondary | ICD-10-CM | POA: Diagnosis not present

## 2020-11-09 DIAGNOSIS — M1990 Unspecified osteoarthritis, unspecified site: Secondary | ICD-10-CM

## 2020-11-09 DIAGNOSIS — Z5181 Encounter for therapeutic drug level monitoring: Secondary | ICD-10-CM

## 2020-11-09 DIAGNOSIS — Z8719 Personal history of other diseases of the digestive system: Secondary | ICD-10-CM

## 2020-11-09 DIAGNOSIS — M1712 Unilateral primary osteoarthritis, left knee: Secondary | ICD-10-CM | POA: Diagnosis not present

## 2020-11-09 DIAGNOSIS — I2510 Atherosclerotic heart disease of native coronary artery without angina pectoris: Secondary | ICD-10-CM

## 2020-11-09 DIAGNOSIS — Z8679 Personal history of other diseases of the circulatory system: Secondary | ICD-10-CM | POA: Diagnosis not present

## 2020-11-09 DIAGNOSIS — M5136 Other intervertebral disc degeneration, lumbar region: Secondary | ICD-10-CM | POA: Diagnosis not present

## 2020-11-09 DIAGNOSIS — Z8639 Personal history of other endocrine, nutritional and metabolic disease: Secondary | ICD-10-CM

## 2020-11-09 DIAGNOSIS — M19041 Primary osteoarthritis, right hand: Secondary | ICD-10-CM

## 2020-11-09 DIAGNOSIS — Z8669 Personal history of other diseases of the nervous system and sense organs: Secondary | ICD-10-CM

## 2020-11-09 DIAGNOSIS — M19042 Primary osteoarthritis, left hand: Secondary | ICD-10-CM

## 2020-11-09 DIAGNOSIS — Z96651 Presence of right artificial knee joint: Secondary | ICD-10-CM | POA: Diagnosis not present

## 2020-11-09 DIAGNOSIS — Z86010 Personal history of colonic polyps: Secondary | ICD-10-CM

## 2020-11-09 DIAGNOSIS — M19071 Primary osteoarthritis, right ankle and foot: Secondary | ICD-10-CM

## 2020-11-09 DIAGNOSIS — Z950 Presence of cardiac pacemaker: Secondary | ICD-10-CM | POA: Diagnosis not present

## 2020-11-09 DIAGNOSIS — Z79899 Other long term (current) drug therapy: Secondary | ICD-10-CM | POA: Diagnosis not present

## 2020-11-09 DIAGNOSIS — M19072 Primary osteoarthritis, left ankle and foot: Secondary | ICD-10-CM

## 2020-11-09 DIAGNOSIS — Z87438 Personal history of other diseases of male genital organs: Secondary | ICD-10-CM

## 2020-11-09 MED ORDER — HYDROXYCHLOROQUINE SULFATE 200 MG PO TABS: 200.0000 mg | ORAL_TABLET | Freq: Every day | ORAL | 0 refills | Status: DC

## 2020-11-09 NOTE — Patient Instructions (Signed)
Vaccines You are taking a medication(s) that can suppress your immune system.  The following immunizations are recommended: Flu annually Covid-19  Td/Tdap (tetanus, diphtheria, pertussis) every 10 years Pneumonia (Prevnar 15 then Pneumovax 23 at least 1 year apart.  Alternatively, can take Prevnar 20 without needing additional dose) Shingrix (after age 77): 2 doses from 4 weeks to 6 months apart  Please check with your PCP to make sure you are up to date.     Back Exercises The following exercises strengthen the muscles that help to support the trunk and back. They also help to keep the lower back flexible. Doing these exercises can help to prevent back pain or lessen existing pain. If you have back pain or discomfort, try doing these exercises 2-3 times each day or as told by your health care provider. As your pain improves, do them once each day, but increase the number of times that you repeat the steps for each exercise (do more repetitions). To prevent the recurrence of back pain, continue to do these exercises once each day or as told by your health care provider. Do exercises exactly as told by your health care provider and adjust them as directed. It is normal to feel mild stretching, pulling, tightness, or discomfort as you do these exercises, but you should stop right away if youfeel sudden pain or your pain gets worse. Exercises Single knee to chest Repeat these steps 3-5 times for each leg: Lie on your back on a firm bed or the floor with your legs extended. Bring one knee to your chest. Your other leg should stay extended and in contact with the floor. Hold your knee in place by grabbing your knee or thigh with both hands and hold. Pull on your knee until you feel a gentle stretch in your lower back or buttocks. Hold the stretch for 10-30 seconds. Slowly release and straighten your leg. Pelvic tilt Repeat these steps 5-10 times: Lie on your back on a firm bed or the floor  with your legs extended. Bend your knees so they are pointing toward the ceiling and your feet are flat on the floor. Tighten your lower abdominal muscles to press your lower back against the floor. This motion will tilt your pelvis so your tailbone points up toward the ceiling instead of pointing to your feet or the floor. With gentle tension and even breathing, hold this position for 5-10 seconds. Cat-cow Repeat these steps until your lower back becomes more flexible: Get into a hands-and-knees position on a firm surface. Keep your hands under your shoulders, and keep your knees under your hips. You may place padding under your knees for comfort. Let your head hang down toward your chest. Contract your abdominal muscles and point your tailbone toward the floor so your lower back becomes rounded like the back of a cat. Hold this position for 5 seconds. Slowly lift your head, let your abdominal muscles relax and point your tailbone up toward the ceiling so your back forms a sagging arch like the back of a cow. Hold this position for 5 seconds.  Press-ups Repeat these steps 5-10 times: Lie on your abdomen (face-down) on the floor. Place your palms near your head, about shoulder-width apart. Keeping your back as relaxed as possible and keeping your hips on the floor, slowly straighten your arms to raise the top half of your body and lift your shoulders. Do not use your back muscles to raise your upper torso. You may adjust the  placement of your hands to make yourself more comfortable. Hold this position for 5 seconds while you keep your back relaxed. Slowly return to lying flat on the floor.  Bridges Repeat these steps 10 times: Lie on your back on a firm surface. Bend your knees so they are pointing toward the ceiling and your feet are flat on the floor. Your arms should be flat at your sides, next to your body. Tighten your buttocks muscles and lift your buttocks off the floor until your  waist is at almost the same height as your knees. You should feel the muscles working in your buttocks and the back of your thighs. If you do not feel these muscles, slide your feet 1-2 inches farther away from your buttocks. Hold this position for 3-5 seconds. Slowly lower your hips to the starting position, and allow your buttocks muscles to relax completely. If this exercise is too easy, try doing it with your arms crossed over yourchest. Abdominal crunches Repeat these steps 5-10 times: Lie on your back on a firm bed or the floor with your legs extended. Bend your knees so they are pointing toward the ceiling and your feet are flat on the floor. Cross your arms over your chest. Tip your chin slightly toward your chest without bending your neck. Tighten your abdominal muscles and slowly raise your trunk (torso) high enough to lift your shoulder blades a tiny bit off the floor. Avoid raising your torso higher than that because it can put too much stress on your low back and does not help to strengthen your abdominal muscles. Slowly return to your starting position. Back lifts Repeat these steps 5-10 times: Lie on your abdomen (face-down) with your arms at your sides, and rest your forehead on the floor. Tighten the muscles in your legs and your buttocks. Slowly lift your chest off the floor while you keep your hips pressed to the floor. Keep the back of your head in line with the curve in your back. Your eyes should be looking at the floor. Hold this position for 3-5 seconds. Slowly return to your starting position. Contact a health care provider if: Your back pain or discomfort gets much worse when you do an exercise. Your worsening back pain or discomfort does not lessen within 2 hours after you exercise. If you have any of these problems, stop doing these exercises right away. Do not do them again unless your health care provider says that you can. Get help right away if: You develop  sudden, severe back pain. If this happens, stop doing the exercises right away. Do not do them again unless your health care provider says that you can. This information is not intended to replace advice given to you by your health care provider. Make sure you discuss any questions you have with your healthcare provider. Document Revised: 09/10/2018 Document Reviewed: 02/05/2018 Elsevier Patient Education  [place].

## 2020-11-09 NOTE — Telephone Encounter (Signed)
Patient with diagnosis of afib on Eliquis for anticoagulation.    Procedure: endoscopy Date of procedure: 12/26/20  CHA2DS2-VASc Score = 5  This indicates a 7.2% annual risk of stroke. The patient's score is based upon: CHF History: Yes HTN History: Yes Diabetes History: No Stroke History: No Vascular Disease History: Yes Age Score: 2 Gender Score: 0  CrCl 24mL/min using adjusted body weight Platelet count 242K  Per office protocol, patient can hold Eliquis for 2 days prior to procedure as requested.

## 2020-11-09 NOTE — Telephone Encounter (Signed)
   Name: [name]  DOB: 1945-05-26  MRN: [id number]   Primary Cardiologist: [name], MD  Chart reviewed as part of pre-operative protocol coverage. We are asked for guidance to hold [place].   Per our clinical pharmacist: Per office protocol, patient can hold Eliquis for 2 days prior to procedure as requested.  I will route this recommendation to the requesting party via Epic fax function and remove from pre-op pool. Please call with questions.  Tami Lin Makyna Niehoff, PA 11/09/2020, 4:13 PM

## 2020-11-10 NOTE — Progress Notes (Signed)
CBC and CMP are normal.

## 2020-11-16 NOTE — Telephone Encounter (Signed)
Patient advised that he has been given clearance to hold Eliquis 2 days prior to EGD scheduled for 12-26-2020.  Patient advised to take last dose of Eliquis on 12-23-2020, and he will be advised when to restart Eliquis by Dr Ardis Hughs after the procedure.  Patient agreed to plan and verbalized understanding.  No further questions.

## 2020-11-21 ENCOUNTER — Ambulatory Visit (INDEPENDENT_AMBULATORY_CARE_PROVIDER_SITE_OTHER): Payer: Medicare Other

## 2020-11-21 DIAGNOSIS — Z95810 Presence of automatic (implantable) cardiac defibrillator: Secondary | ICD-10-CM

## 2020-11-21 DIAGNOSIS — I5022 Chronic systolic (congestive) heart failure: Secondary | ICD-10-CM

## 2020-11-22 NOTE — Progress Notes (Signed)
EPIC Encounter for ICM Monitoring  Patient Name: [name] is a 75 y.o. male Date: 11/22/2020 Primary Care Physican: Biagio Borg, MD Primary Cardiologist: Burt Knack Electrophysiologist: Curt Bears Bi-V Pacing:   93.2%       11/09/2020 Office Weight: 197 lbs     Time in AT/AF    <0.1 hr/day (<0.1%)                                                            Spoke with patient and heart failure questions reviewed.  Pt asymptomatic for fluid accumulation and feeing well.   Optivol thoracic impedance normal.    Takes Spirolactone 25 mg take 0.5 tablet daily.   Recommendations: No changes and encouraged to call if experiencing any fluid symptoms.   Follow-up plan: ICM clinic phone appointment on 12/25/2020.   91 day device clinic remote transmission 02/01/2021.     EP/Cardiology Office Visits:  Recall 06/21/2021 with Dr Burt Knack.  Recall 07/05/2021 with Dr. Curt Bears.     Copy of ICM check sent to Dr. Curt Bears.   3 month ICM trend: 11/21/2020.    1 Year ICM trend:       [name], RN 11/22/2020 8:19 AM

## 2020-11-23 ENCOUNTER — Other Ambulatory Visit: Payer: Self-pay | Admitting: Cardiovascular Disease

## 2020-11-23 NOTE — Progress Notes (Signed)
Remote ICD transmission.   

## 2020-11-26 LAB — DRUG MONITOR, PANEL 5, W/CONF, URINE
Amphetamines: NEGATIVE ng/mL (ref <500)
Barbiturates: NEGATIVE ng/mL (ref <300)
Benzodiazepines: NEGATIVE ng/mL (ref <100)
Cocaine Metabolite: NEGATIVE ng/mL (ref <150)
Creatinine: 90.2 mg/dL (ref >=20.0)
Marijuana Metabolite: NEGATIVE ng/mL (ref <20)
Methadone Metabolite: NEGATIVE ng/mL (ref <100)
Opiates: NEGATIVE ng/mL (ref <100)
Oxidant: NEGATIVE ug/mL (ref <200)
Oxycodone: NEGATIVE ng/mL (ref <100)
pH: 6.2 (ref 4.5–9.0)

## 2020-11-26 LAB — CBC WITH DIFFERENTIAL/PLATELET
Absolute Monocytes: 566 cells/uL (ref 200–950)
Basophils Absolute: 41 cells/uL (ref 0–200)
Basophils Relative: 0.7 %
Eosinophils Absolute: 142 cells/uL (ref 15–500)
Eosinophils Relative: 2.4 %
HCT: 42.7 % (ref 38.5–50.0)
Hemoglobin: 14.2 g/dL (ref 13.2–17.1)
Lymphs Abs: 1552 cells/uL (ref 850–3900)
MCH: 30.2 pg (ref 27.0–33.0)
MCHC: 33.3 g/dL (ref 32.0–36.0)
MCV: 90.9 fL (ref 80.0–100.0)
MPV: 9.8 fL (ref 7.5–12.5)
Monocytes Relative: 9.6 %
Neutro Abs: 3599 cells/uL (ref 1500–7800)
Neutrophils Relative %: 61 %
Platelets: 227 10*3/uL (ref 140–400)
RBC: 4.7 10*6/uL (ref 4.20–5.80)
RDW: 12.6 % (ref 11.0–15.0)
Total Lymphocyte: 26.3 %
WBC: 5.9 10*3/uL (ref 3.8–10.8)

## 2020-11-26 LAB — DM TEMPLATE

## 2020-11-26 LAB — COMPLETE METABOLIC PANEL WITH GFR
AG Ratio: 1.8 (calc) (ref 1.0–2.5)
ALT: 19 U/L (ref 9–46)
AST: 19 U/L (ref 10–35)
Albumin: 4.3 g/dL (ref 3.6–5.1)
Alkaline phosphatase (APISO): 65 U/L (ref 35–144)
BUN: 18 mg/dL (ref 7–25)
CO2: 24 mmol/L (ref 20–32)
Calcium: 9.5 mg/dL (ref 8.6–10.3)
Chloride: 106 mmol/L (ref 98–110)
Creat: 0.81 mg/dL (ref 0.70–1.18)
GFR, Est African American: 100 mL/min/{1.73_m2} (ref >=60)
GFR, Est Non African American: 86 mL/min/{1.73_m2} (ref >=60)
Globulin: 2.4 g/dL (calc) (ref 1.9–3.7)
Glucose, Bld: 102 mg/dL — ABNORMAL HIGH (ref 65–99)
Potassium: 4.1 mmol/L (ref 3.5–5.3)
Sodium: 139 mmol/L (ref 135–146)
Total Bilirubin: 1 mg/dL (ref 0.2–1.2)
Total Protein: 6.7 g/dL (ref 6.1–8.1)

## 2020-11-26 LAB — DRUG MONITOR, TRAMADOL,QN, URINE
Desmethyltramadol: 1134 ng/mL — ABNORMAL HIGH (ref <100)
Tramadol: 1099 ng/mL — ABNORMAL HIGH (ref <100)

## 2020-11-27 ENCOUNTER — Other Ambulatory Visit: Payer: Self-pay | Admitting: Rheumatology

## 2020-11-27 NOTE — Progress Notes (Signed)
UDS is consistent with tramadol use.

## 2020-11-28 NOTE — Telephone Encounter (Signed)
Last Visit: 04/19/2021  Next Visit: 11/09/2020  UDS:11/09/2020 UDS is consistent with tramadol use.  Narc Agreement: 11/09/2020  Last Fill: 10/30/2020 tramadol 50mg  1 tablet by mouth at bedtime as needed.  Okay to refill Tramadol?

## 2020-12-01 DIAGNOSIS — Z23 Encounter for immunization: Secondary | ICD-10-CM | POA: Diagnosis not present

## 2020-12-25 ENCOUNTER — Ambulatory Visit (INDEPENDENT_AMBULATORY_CARE_PROVIDER_SITE_OTHER): Payer: Medicare Other

## 2020-12-25 DIAGNOSIS — Z95810 Presence of automatic (implantable) cardiac defibrillator: Secondary | ICD-10-CM | POA: Diagnosis not present

## 2020-12-25 DIAGNOSIS — I5022 Chronic systolic (congestive) heart failure: Secondary | ICD-10-CM | POA: Diagnosis not present

## 2020-12-26 ENCOUNTER — Ambulatory Visit (AMBULATORY_SURGERY_CENTER): Payer: Medicare Other | Admitting: Gastroenterology

## 2020-12-26 ENCOUNTER — Encounter: Payer: Self-pay | Admitting: Gastroenterology

## 2020-12-26 ENCOUNTER — Telehealth: Payer: Self-pay

## 2020-12-26 ENCOUNTER — Other Ambulatory Visit: Payer: Self-pay

## 2020-12-26 VITALS — BP 124/64 | HR 74 | Temp 98.4°F | Resp 20

## 2020-12-26 DIAGNOSIS — R198 Other specified symptoms and signs involving the digestive system and abdomen: Secondary | ICD-10-CM

## 2020-12-26 DIAGNOSIS — Z86010 Personal history of colonic polyps: Secondary | ICD-10-CM | POA: Diagnosis not present

## 2020-12-26 DIAGNOSIS — K317 Polyp of stomach and duodenum: Secondary | ICD-10-CM

## 2020-12-26 MED ORDER — SODIUM CHLORIDE 0.9 % IV SOLN: 500.0000 mL | Freq: Once | INTRAVENOUS | Status: DC

## 2020-12-26 NOTE — Progress Notes (Signed)
C.W. vital signs. 

## 2020-12-26 NOTE — Progress Notes (Signed)
EPIC Encounter for ICM Monitoring  Patient Name: [name] is a 75 y.o. male Date: 12/26/2020 Primary Care Physican: Biagio Borg, MD Primary Cardiologist: Burt Knack Electrophysiologist: Curt Bears Bi-V Pacing:   93.2%       11/09/2020 Office Weight: 197 lbs     Time in AT/AF    <0.1 hr/day (<0.1%)                                                          Spoke with patient and heart failure questions reviewed.  Pt asymptomatic for fluid accumulation and feeing well.  He had an endoscopy today.   Optivol thoracic impedance normal.    Takes Spirolactone 25 mg take 0.5 tablet daily.   Recommendations: No changes and encouraged to call if experiencing any fluid symptoms.   Follow-up plan: ICM clinic phone appointment on 01/29/2021.   91 day device clinic remote transmission 02/01/2021.     EP/Cardiology Office Visits:  Recall 06/21/2021 with Dr Burt Knack.  Recall 07/05/2021 with Dr. Curt Bears.     Copy of ICM check sent to Dr. Curt Bears.    3 month ICM trend: 12/25/2020.    1 Year ICM trend:       [name], RN 12/26/2020 5:09 PM

## 2020-12-26 NOTE — Patient Instructions (Signed)
Continue present medications. OK to resume your blood thinner today.   Dr. Ardis Hughs will discuss with Dr. Rush Landmark to consider ampullectomy in hospital setting.   YOU HAD AN ENDOSCOPIC PROCEDURE TODAY AT [facility]:   Refer to the procedure report that was given to you for any specific questions about what was found during the examination.  If the procedure report does not answer your questions, please call your gastroenterologist to clarify.  If you requested that your care partner not be given the details of your procedure findings, then the procedure report has been included in a sealed envelope for you to review at your convenience later.  YOU SHOULD EXPECT: Some feelings of bloating in the abdomen. Passage of more gas than usual.  Walking can help get rid of the air that was put into your GI tract during the procedure and reduce the bloating. If you had a lower endoscopy (such as a colonoscopy or flexible sigmoidoscopy) you may notice spotting of blood in your stool or on the toilet paper. If you underwent a bowel prep for your procedure, you may not have a normal bowel movement for a few days.  Please Note:  You might notice some irritation and congestion in your nose or some drainage.  This is from the oxygen used during your procedure.  There is no need for concern and it should clear up in a day or so.  SYMPTOMS TO REPORT IMMEDIATELY:  Following upper endoscopy (EGD)  Vomiting of blood or coffee ground material  New chest pain or pain under the shoulder blades  Painful or persistently difficult swallowing  New shortness of breath  Fever of 100F or higher  Black, tarry-looking stools  For urgent or emergent issues, a gastroenterologist can be reached at any hour by calling [phone]. Do not use MyChart messaging for urgent concerns.    DIET:  We do recommend a small meal at first, but then you may proceed to your regular diet.  Drink plenty of fluids but you  should avoid alcoholic beverages for 24 hours.  ACTIVITY:  You should plan to take it easy for the rest of today and you should NOT DRIVE or use heavy machinery until tomorrow (because of the sedation medicines used during the test).    FOLLOW UP: Our staff will call the number listed on your records 48-72 hours following your procedure to check on you and address any questions or concerns that you may have regarding the information given to you following your procedure. If we do not reach you, we will leave a message.  We will attempt to reach you two times.  During this call, we will ask if you have developed any symptoms of COVID 19. If you develop any symptoms (ie: fever, flu-like symptoms, shortness of breath, cough etc.) before then, please call [phone].  If you test positive for Covid 19 in the 2 weeks post procedure, please call and report this information to [place].    If any biopsies were taken you will be contacted by phone or by letter within the next 1-3 weeks.  Please call us at [phone] if you have not heard about the biopsies in 3 weeks.    SIGNATURES/CONFIDENTIALITY: You and/or your care partner have signed paperwork which will be entered into your electronic medical record.  These signatures attest to the fact that that the information above on your After Visit Summary has been reviewed and is understood.  Full responsibility of  the confidentiality of this discharge information lies with you and/or your care-partner.

## 2020-12-26 NOTE — Op Note (Signed)
[place] Patient Name: [name] Procedure Date: 12/26/2020 8:32 AM MRN: [id number] Endoscopist: [name] , MD Age: 75 Referring MD:  Date of Birth: [birth year] Gender: Male Account #: [id number] Procedure:                Upper GI endoscopy Indications:              incidentally noted duodenal polyp partially                            resected endoscopically TVA (09/2008) (Dr. Henrene Pastor),                            repeat procedure December 2012 found small residual                            tubular adenoma at the site, appeared to                            becompletely removed endoscopically, 2012 repeat                            EGD with small residual adenoma at the site                            removed. repeat EGD 2016 with small adenoma at the                            site removed.EGD Dr. Ardis Hughs March 2019 found                            small amount of apparent recurrent polypoid growth                            removed with biopsy forceps,this was tubular                            adenoma on pathology. Medicines:                Monitored Anesthesia Care Procedure:                Pre-Anesthesia Assessment:                           - Prior to the procedure, a History and Physical                            was performed, and patient medications and                            allergies were reviewed. The patient's tolerance of                            previous anesthesia was also reviewed. The risks  and benefits of the procedure and the sedation                            options and risks were discussed with the patient.                            All questions were answered, and informed consent                            was obtained. Prior Anticoagulants: The patient has                            taken Eliquis (apixaban), last dose was 3 days                            prior to procedure. ASA Grade  Assessment: III - A                            patient with severe systemic disease. After                            reviewing the risks and benefits, the patient was                            deemed in satisfactory condition to undergo the                            procedure.                           After obtaining informed consent, the endoscope was                            passed under direct vision. Throughout the                            procedure, the patient's blood pressure, pulse, and                            oxygen saturations were monitored continuously. The                            GIF [id number] was introduced through the                            mouth, and advanced to the second part of duodenum.                            The upper GI endoscopy was accomplished without                            difficulty. The patient tolerated the procedure  well. Scope In: Scope Out: Findings:                 Protuberant ampullary polypoid lesion, measuring                            1cm. Appears different from previous examinations,                            seems to clearly involve the ampullary orifice, see                            images.                           Small hiatal hernia.                           Numerous fleshy, typical appearing fundic gland                            polyps throughout stomach measuring 58m to 1cm.                           The exam was otherwise without abnormality. Complications:            No immediate complications. Estimated blood loss:                            None. Estimated Blood Loss:     Estimated blood loss was minimal. Impression:               - Protuberant ampullary polypoid lesion, measuring                            1cm. Appears different from previous examinations,                            seems to clearly involve the ampullary orifice, see                            images.                            - Small hiatal hernia.                           - Numerous fleshy, typical appearing fundic gland                            polyps throughout stomach measuring 338mto 1cm.                           - The examination was otherwise normal. Recommendation:           - Patient has a contact number available for  emergencies. The signs and symptoms of potential                            delayed complications were discussed with the                            patient. Return to normal activities tomorrow.                            Written discharge instructions were provided to the                            patient.                           - Resume previous diet.                           - Continue present medications. OK to resume your                            blood thinner today.                           - Dr. Ardis Hughs will discuss this with Dr. Rush Landmark                            to consider ampullectomy in hospital setting. [name], MD 12/26/2020 8:52:52 AM This report has been signed electronically.

## 2020-12-26 NOTE — Progress Notes (Signed)
Pt in recovery with monitors in place, VSS. Report given to receiving RN. Bite guard was placed with pt awake to ensure comfort. No dental or soft tissue damage noted. 

## 2020-12-26 NOTE — Progress Notes (Signed)
incidentally noted duodenal polyp partially  resected endoscopically TVA (09/2008) (Dr. Henrene Pastor),  repeat procedure December 2012 found small residual  tubular adenoma at the site, appeared to be  completely removed endoscopically, 2012 repeat EGD  with small residual adenoma at the site removed.  repeat EGD 2016 with small adenoma at the site removed.  EGD Dr. Ardis Hughs March 2019 found small amount of apparent recurrent polypoid growth removed with biopsy forceps, this was tubular adenoma on pathology.  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  Constitutional:  No unintentional weight loss   Past Medical History:  Diagnosis Date   AICD (automatic cardioverter/defibrillator) present    Allergic rhinitis    Allergy    Anginal pain ([place]) 05/26/14   chest pain after chasing dog   Atypical nevus of back 04/27/2003   moderate - mid lower back   Basal cell carcinoma 01/26/2008   right cheek - MOHs   CAD (coronary artery disease)    hx of stent- 2005 RCA   Cataract    beginning stage both eyes   CHF (congestive heart failure) (HCC)    pacemaker Medtronic     Chronic back pain    intermittent   Constipation    Dizziness    Dysrhythmia    right bundle branch block    Esophageal stricture    GERD (gastroesophageal reflux disease)    Hemoptysis    Hiatal hernia    History of colonic polyps    hyperplastic   HTN (hypertension)    Hyperlipidemia    Hypertrophy of prostate with urinary obstruction and other lower urinary tract symptoms (LUTS)    OA (osteoarthritis)    OSA (obstructive sleep apnea)    cpap- 10    Other specified disorder of stomach and duodenum    duodenal periampulary tubulovillous adenoma removed by Dr. Ardis Hughs 5/10   Pericarditis 07/06/2019   Pneumonia    Pre-diabetes    no medications   Shortness of breath    with exertion   Sleep apnea    cpap    Testicular hypofunction     Past Surgical History:  Procedure Laterality Date   BIV ICD INSERTION CRT-D N/A 03/27/2017   Procedure: BIV ICD INSERTION CRT-D;  Surgeon: Constance Haw, MD;  Location: [place] [facility];  Service: Cardiovascular;  Laterality: N/A;   CARDIAC CATHETERIZATION     '05, last 2009, showing patent RCA stent   COLONOSCOPY  12/2007   HYPERPLASTIC POLYP   COLONOSCOPY  12/2019   COLONOSCOPY WITH PROPOFOL N/A 06/02/2014   Procedure: COLONOSCOPY WITH PROPOFOL;  Surgeon: [name], MD;  Location: [facility];  Service: Endoscopy;  Laterality: N/A;   CORONARY ANGIOPLASTY  08/2003   ESOPHAGOGASTRODUODENOSCOPY (EGD) WITH PROPOFOL N/A 06/02/2014   Procedure: ESOPHAGOGASTRODUODENOSCOPY (EGD) WITH PROPOFOL;  Surgeon: [name], MD;  Location: [facility];  Service: Endoscopy;  Laterality: N/A;   hernia surgery x 3     Bilateral Inguinal, Umbicial   INSERTION OF MESH N/A 03/20/2018   Procedure: INSERTION OF MESH;  Surgeon: Alphonsa Overall, MD;  Location: [place];  Service: General;  Laterality: N/A;   IRRIGATION AND DEBRIDEMENT ABSCESS Left 08/14/2012   Procedure: IRRIGATION AND DEBRIDEMENT LEFT INGUINAL BOIL ;  Surgeon: Ailene Rud, MD;  Location: [facility];  Service: Urology;  Laterality: Left;   JOINT REPLACEMENT Right 2017  KNEE ARTHROPLASTY     LEFT HEART CATH AND CORONARY ANGIOGRAPHY N/A 01/24/2019   Procedure: LEFT HEART CATH AND CORONARY ANGIOGRAPHY;  Surgeon: Martinique, Peter M, MD;  Location: [place] [facility];  Service: Cardiovascular;  Laterality: N/A;   LEFT HEART CATHETERIZATION WITH CORONARY ANGIOGRAM N/A 05/26/2014   Procedure: LEFT HEART CATHETERIZATION WITH CORONARY ANGIOGRAM;  Surgeon: Blane Ohara, MD;  Location: [facility];  Service: Cardiovascular;  Laterality: N/A;   LUMBAR LAMINECTOMY/DECOMPRESSION MICRODISCECTOMY Left 09/26/2014   Procedure: Lumbar Laminectomy for resection of synovial cyst  Lumbar five- sacral one left;  Surgeon: Kary Kos, MD;  Location: [place] [facility];  Service: Neurosurgery;  Laterality: Left;   MOLE REMOVAL Left 03/20/2018   Procedure: MOLE REMOVAL;  Surgeon: Alphonsa Overall, MD;  Location: [place];  Service: General;  Laterality: Left;   Oral surg to removed growth from ?sinus  10/2010   ?Dermoid removed by [facility]   PACEMAKER INSERTION  04/03/2017   POLYPECTOMY     RCA stenting     '05 RCA   right toe surgery  Right    Cyst    RIGHT/LEFT HEART CATH AND CORONARY ANGIOGRAPHY N/A 01/15/2017   Procedure: RIGHT/LEFT HEART CATH AND CORONARY ANGIOGRAPHY;  Surgeon: [name], MD;  Location: [place] [facility];  Service: Cardiovascular;  Laterality: N/A;   s/p right knee arthroscopy  2005   thumb surgery Right    TOTAL KNEE ARTHROPLASTY Right 03/12/2016   Procedure: RIGHT TOTAL KNEE ARTHROPLASTY;  Surgeon: Paralee Cancel, MD;  Location: [facility];  Service: Orthopedics;  Laterality: Right;   UPPER GASTROINTESTINAL ENDOSCOPY     VENTRAL HERNIA REPAIR N/A 03/20/2018   Procedure: [place];  Surgeon: Alphonsa Overall, MD;  Location: [place];  Service: General;  Laterality: N/A;    Current Outpatient Medications  Medication Sig Dispense Refill   acetaminophen (TYLENOL) 500 MG tablet Take 500 mg by mouth 2 (two) times daily.     atorvastatin (LIPITOR) 20 MG tablet TAKE 1 TABLET BY MOUTH DAILY 90 tablet 3   b complex vitamins tablet Take 1 tablet by mouth daily with lunch.     carvedilol (COREG) 25 MG tablet TAKE 1 TABLET BY MOUTH 2 TIMES DAILY. GENERIC EQUIVALENT FOR COREG. 180 tablet 3   CINNAMON PO Take 1 tablet by mouth daily with lunch.      Coenzyme Q10 (CO Q 10) 100 MG CAPS Take 1 capsule by mouth daily with lunch.     diclofenac sodium (VOLTAREN) 1 % GEL Apply 1 application topically 4 (four) times daily as needed (pain).   3   ENTRESTO 49-51 MG TAKE 1 TABLET BY MOUTH TWICE DAILY 99991111 tablet 3   folic acid (FOLVITE) A999333 MCG tablet Take 400 mcg by mouth daily.      Glucosamine HCl (GLUCOSAMINE PO) Take 1,500 mg by mouth every evening.     hydroCHLOROthiazide (MICROZIDE PO) hydrochlorothiazide     hydrocortisone 2.5 % cream Apply 1 application topically 2 (two) times daily as needed (rash on nose).      hydroxychloroquine (PLAQUENIL) 200 MG tablet Take 1 tablet (200 mg total) by mouth daily. 90 tablet 0   Multiple Vitamin (MULTIVITAMIN) capsule Take 1 capsule by mouth daily.       Multiple Vitamins-Minerals (OCUVITE PO) Take 1 tablet by mouth at bedtime.     NON FORMULARY CPAP machine with sleep.     pantoprazole (PROTONIX) 40 MG tablet TAKE 1 TABLET BY MOUTH  EVERY DAY BEFORE BREAKFAST 90 tablet 3   Probiotic Product (PROBIOTIC DAILY PO) Take 1 capsule by mouth daily with lunch.      Saw Palmetto, Serenoa repens, (SAW PALMETTO PO) Take 1 capsule by mouth every evening.     spironolactone (ALDACTONE) 25 MG tablet TAKE 1/2 TABLET(12.5 MG) BY MOUTH DAILY 45 tablet 2   tamsulosin (FLOMAX) 0.4 MG CAPS capsule Take 0.4 mg by mouth as needed.     Testosterone 20 % CREA Apply 2 mLs topically daily. Rub on shoulder     traMADol (ULTRAM) 50 MG tablet TAKE 1 TABLET(50 MG) BY MOUTH AT BEDTIME AS NEEDED 30 tablet 0   TURMERIC PO Take 1 tablet by mouth daily with lunch.      ELIQUIS 5 MG TABS tablet TAKE 1 TABLET BY MOUTH TWICE DAILY 180 tablet 1   nitroGLYCERIN (NITROSTAT) 0.4 MG SL tablet Place 1 tablet (0.4 mg total) under the tongue every 5 (five) minutes x 3 doses as needed for chest pain. 25 tablet 0   Current Facility-Administered Medications  Medication Dose Route Frequency Provider Last Rate Last Admin   0.9 %  sodium chloride infusion  500 mL Intravenous Once [name], MD        Allergies as of 12/26/2020 - Review Complete 12/26/2020  Allergen Reaction Noted   Sulfonamide derivatives Rash 06/26/2010    Family History  Problem Relation Age of Onset   Coronary artery disease Father    Diabetes Father    Sudden death Father        due to heart  disease   Heart disease Father    Heart disease Mother    Hypertension Other    Stomach cancer Paternal Grandmother    COPD Brother    Arthritis Brother    Heart failure Brother    Cancer Brother        lung    Arthritis Daughter    Colon cancer Neg Hx    Esophageal cancer Neg Hx    Colon polyps Neg Hx    Ulcerative colitis Neg Hx     Social History   Socioeconomic History   Marital status: Married    Spouse name: Not on file   Number of children: 1   Years of education: Not on file   Highest education level: Not on file  Occupational History   Occupation: Lobbyist: [employer]  Tobacco Use   Smoking status: Former    Types: Cigarettes    Quit date: 05/20/1990    Years since quitting: 30.6   Smokeless tobacco: Never   Tobacco comments:    previous 30 pack year history  Vaping Use   Vaping Use: Never used  Substance and Sexual Activity   Alcohol use: Yes    Comment: rare   Drug use: Never   Sexual activity: Not on file  Other Topics Concern   Not on file  Social History Narrative   Not on file   Social Determinants of Health   Financial Resource Strain: Not on file  Food Insecurity: Not on file  Transportation Needs: Not on file  Physical Activity: Not on file  Stress: Not on file  Social Connections: Not on file  Intimate Partner Violence: Not on file     Physical Exam: Temp 98.4 F (36.9 C)  Constitutional: generally well-appearing Psychiatric: alert and oriented x3 Abdomen: soft, nontender, nondistended, no obvious ascites, no peritoneal signs, normal bowel sounds No  peripheral edema noted in lower extremities  Assessment and plan: 75 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  Owens Loffler, MD [facility] 12/26/2020, 8:35 AM

## 2020-12-26 NOTE — Progress Notes (Signed)
Pt's states no medical or surgical changes since previsit or office visit. 

## 2020-12-26 NOTE — Telephone Encounter (Signed)
-----   Message from [name], MD sent at 12/26/2020  4:12 PM EDT ----- Chong Sicilian, see below.  He needs pancreatic protocol CT scan for ampullary nodule.  Also first available office appointment with Gabe to discuss ampullectomy.   Gabe, thanks again.   ----- Message ----- From: Irving Copas., MD Sent: 12/26/2020   3:38 PM EDT To: [name], MD  Pancreas protocol CT abdomen sounds like a good plan. Thanks. GM ----- Message ----- From: [name], MD Sent: 12/26/2020   3:23 PM EDT To: Irving Copas., MD  Gabe, Thanks very much.  I spoke with him and he is willing to proceed.  He has a pacemaker and so MRI is probably not safe.  What his CT scan pancreatic protocol suffice?   ----- Message ----- From: Irving Copas., MD Sent: 12/26/2020   3:09 PM EDT To: [name], MD  DJ, Reviewed the imaging going back to 2012. At one point and look like it was just periampullary but certainly now looks to be ampullary. Would recommend a MRI/MRCP to evaluate PD and CBD. I can get him into clinic today and discussed EUS/ERCP with ampullectomy. If you feel he would be okay with this then we can have Kaitlyn Skowron schedule the MRI and get him in to see me in clinic and get his time slot arranged.   Thanks. GM  ----- Message ----- From: [name], MD Sent: 12/26/2020   8:55 AM EDT To: Irving Copas., MD  Gabe, Can you check out today's EGD images. This is different from his previous EGDs and I think he needs a formal ampullectomy.  I didn't biopsy the site today, maybe I should have.  It's been present for years, never HGD but clearly recurrent.    Thanks  DJ

## 2020-12-27 NOTE — Telephone Encounter (Signed)
Order entered for CT pancreatic protocol.  Sent to the schedulers to set up   Appt made for follow up on 9/16 with GM  The pt has been advised and was provided the number to call [phone] if he has not heard from the schedulers in 1 week.

## 2020-12-28 ENCOUNTER — Telehealth: Payer: Self-pay

## 2020-12-28 DIAGNOSIS — H019 Unspecified inflammation of eyelid: Secondary | ICD-10-CM | POA: Diagnosis not present

## 2020-12-28 DIAGNOSIS — D485 Neoplasm of uncertain behavior of skin: Secondary | ICD-10-CM | POA: Diagnosis not present

## 2020-12-28 DIAGNOSIS — H04123 Dry eye syndrome of bilateral lacrimal glands: Secondary | ICD-10-CM | POA: Diagnosis not present

## 2020-12-28 NOTE — Telephone Encounter (Signed)
  Follow up Call-  Call back number 12/26/2020 01/14/2020  Post procedure Call Back phone  # [phone]  Permission to leave phone message Yes Yes  Some recent data might be hidden     Patient questions:  Do you have a fever, pain , or abdominal swelling? No. Pain Score  0 *  Have you tolerated food without any problems? Yes.    Have you been able to return to your normal activities? Yes.    Do you have any questions about your discharge instructions: Diet   No. Medications  No. Follow up visit  No.  Do you have questions or concerns about your Care? No.  Actions: * If pain score is 4 or above: No action needed, pain <4.

## 2021-01-02 ENCOUNTER — Other Ambulatory Visit: Payer: Self-pay | Admitting: Rheumatology

## 2021-01-03 DIAGNOSIS — H04129 Dry eye syndrome of unspecified lacrimal gland: Secondary | ICD-10-CM | POA: Diagnosis not present

## 2021-01-03 DIAGNOSIS — M0569 Rheumatoid arthritis of multiple sites with involvement of other organs and systems: Secondary | ICD-10-CM | POA: Diagnosis not present

## 2021-01-03 DIAGNOSIS — Z79899 Other long term (current) drug therapy: Secondary | ICD-10-CM | POA: Diagnosis not present

## 2021-01-03 DIAGNOSIS — D485 Neoplasm of uncertain behavior of skin: Secondary | ICD-10-CM | POA: Diagnosis not present

## 2021-01-03 NOTE — Telephone Encounter (Signed)
Last Visit: 04/19/2021   Next Visit: 11/09/2020   UDS:11/09/2020 UDS is consistent with tramadol use.   Narc Agreement: 11/09/2020   Last Fill: 11/28/2020 tramadol '50mg'$  1 tablet by mouth at bedtime as needed.   Okay to refill Tramadol?

## 2021-01-11 ENCOUNTER — Ambulatory Visit (HOSPITAL_COMMUNITY)
Admission: RE | Admit: 2021-01-11 | Discharge: 2021-01-11 | Disposition: A | Discharge status: Home / Self Care | Payer: Medicare Other | Source: Ambulatory Visit | Attending: Gastroenterology | Admitting: Gastroenterology

## 2021-01-11 ENCOUNTER — Telehealth: Payer: Self-pay

## 2021-01-11 ENCOUNTER — Other Ambulatory Visit: Payer: Self-pay

## 2021-01-11 DIAGNOSIS — R198 Other specified symptoms and signs involving the digestive system and abdomen: Secondary | ICD-10-CM | POA: Insufficient documentation

## 2021-01-11 DIAGNOSIS — K4090 Unilateral inguinal hernia, without obstruction or gangrene, not specified as recurrent: Secondary | ICD-10-CM | POA: Diagnosis not present

## 2021-01-11 DIAGNOSIS — K315 Obstruction of duodenum: Secondary | ICD-10-CM | POA: Diagnosis not present

## 2021-01-11 DIAGNOSIS — N3289 Other specified disorders of bladder: Secondary | ICD-10-CM | POA: Diagnosis not present

## 2021-01-11 DIAGNOSIS — K8689 Other specified diseases of pancreas: Secondary | ICD-10-CM | POA: Diagnosis not present

## 2021-01-11 LAB — POCT I-STAT CREATININE: Creatinine, Ser: 0.9 mg/dL (ref 0.61–1.24)

## 2021-01-11 MED ORDER — IOHEXOL 350 MG/ML SOLN
100.0000 mL | Freq: Once | INTRAVENOUS | Status: AC | PRN
  Administered 2021-01-11: 80 mL via INTRAVENOUS

## 2021-01-11 NOTE — Telephone Encounter (Signed)
   [facility] Pre-operative Risk Assessment    Patient Name: [name]  DOB: 1945/08/03 MRN: [id number]  HEARTCARE STAFF:  - IMPORTANT!!!!!! Under Visit Info/Reason for Call, type in Other and utilize the format Clearance MM/DD/YY or Clearance TBD. Do not use dashes or single digits. - Please review there is not already an duplicate clearance open for this procedure. - If request is for dental extraction, please clarify the # of teeth to be extracted. - If the patient is currently at the dentist's office, call Pre-Op Callback Staff (MA/nurse) to input urgent request.  - If the patient is not currently in the dentist office, please route to the Pre-Op pool.  Request for surgical clearance:  What type of surgery is being performed? LLL excision of lesion   When is this surgery scheduled? 02/01/21  What type of clearance is required (medical clearance vs. Pharmacy clearance to hold med vs. Both)? Both   Are there any medications that need to be held prior to surgery and how long? Eliquis   Practice name and name of physician performing surgery? [facility], Dr. Sabino Dick   What is the office phone number? [phone]   7.   What is the office fax number? [phone]  8.   Anesthesia type (None, local, MAC, general) ? Local    Mendel Ryder 01/11/2021, 8:41 AM  _________________________________________________________________   (provider comments below)

## 2021-01-12 ENCOUNTER — Telehealth: Payer: Self-pay | Admitting: Gastroenterology

## 2021-01-12 DIAGNOSIS — N401 Enlarged prostate with lower urinary tract symptoms: Secondary | ICD-10-CM | POA: Diagnosis not present

## 2021-01-12 NOTE — Telephone Encounter (Signed)
    Patient Name: [name]  DOB: [birth year] MRN: [id number]  Primary Cardiologist: [name], MD  Chart reviewed as part of pre-operative protocol coverage. Given past medical history and time since last visit, based on ACC/AHA guidelines, [name] would be at acceptable risk for the planned procedure without further cardiovascular testing.  Patient with diagnosis of afib on Eliquis for anticoagulation.     Procedure: LLL excision of lesion Date of procedure: 02/01/21   CHA2DS2-VASc Score = 5  This indicates a 7.2% annual risk of stroke. The patient's score is based upon: CHF History: Yes HTN History: Yes Diabetes History: No Stroke History: No Vascular Disease History: Yes Age Score: 2 Gender Score: 0   CrCl 84 ml/min   Per office protocol, patient can hold Eliquis for 2 days prior to procedure.    The patient was advised that if he develops new symptoms prior to surgery to contact our office to arrange for a follow-up visit, and he verbalized understanding.  I will route this recommendation to the requesting party via Epic fax function and remove from pre-op pool.  Please call with questions.  Kathyrn Drown, NP 01/12/2021, 1:06 PM

## 2021-01-12 NOTE — Telephone Encounter (Signed)
Patient with diagnosis of afib on Eliquis for anticoagulation.    Procedure: LLL excision of lesion Date of procedure: 02/01/21  CHA2DS2-VASc Score = 5  This indicates a 7.2% annual risk of stroke. The patient's score is based upon: CHF History: Yes HTN History: Yes Diabetes History: No Stroke History: No Vascular Disease History: Yes Age Score: 2 Gender Score: 0      CrCl 84 ml/min  Per office protocol, patient can hold Eliquis for 2 days prior to procedure.

## 2021-01-12 NOTE — Telephone Encounter (Signed)
The pt has been advised that all records have been sent to [facility]

## 2021-01-12 NOTE — Telephone Encounter (Signed)
Pt called to let you know that he is scheduled to see Dr. Diona Fanti at [facility] next Thursday. He is asking to fax any  pertaining records to them.

## 2021-01-19 DIAGNOSIS — R3915 Urgency of urination: Secondary | ICD-10-CM | POA: Diagnosis not present

## 2021-01-19 DIAGNOSIS — N401 Enlarged prostate with lower urinary tract symptoms: Secondary | ICD-10-CM | POA: Diagnosis not present

## 2021-01-19 DIAGNOSIS — E291 Testicular hypofunction: Secondary | ICD-10-CM | POA: Diagnosis not present

## 2021-01-29 ENCOUNTER — Ambulatory Visit (INDEPENDENT_AMBULATORY_CARE_PROVIDER_SITE_OTHER): Payer: Medicare Other

## 2021-01-29 DIAGNOSIS — I5022 Chronic systolic (congestive) heart failure: Secondary | ICD-10-CM | POA: Diagnosis not present

## 2021-01-29 DIAGNOSIS — Z95810 Presence of automatic (implantable) cardiac defibrillator: Secondary | ICD-10-CM | POA: Diagnosis not present

## 2021-01-29 NOTE — Telephone Encounter (Signed)
Dr. Beacher May practice [facility] is requesting this to be sent via fax please [phone]

## 2021-01-31 ENCOUNTER — Telehealth: Payer: Self-pay

## 2021-01-31 LAB — CUP PACEART REMOTE DEVICE CHECK
Battery Remaining Longevity: 32 mo
Battery Voltage: 2.94 V
Brady Statistic AP VP Percent: 0.19 %
Brady Statistic AP VS Percent: 0.01 %
Brady Statistic AS VP Percent: 98.37 %
Brady Statistic AS VS Percent: 1.44 %
Brady Statistic RA Percent Paced: 0.19 %
Brady Statistic RV Percent Paced: 32.98 %
Date Time Interrogation Session: 20220914114348
HighPow Impedance: 65 Ohm
Implantable Lead Implant Date: 20181108
Implantable Lead Implant Date: 20181108
Implantable Lead Implant Date: 20181108
Implantable Lead Location: 753858
Implantable Lead Location: 753859
Implantable Lead Location: 753860
Implantable Lead Model: 4298
Implantable Lead Model: 5076
Implantable Pulse Generator Implant Date: 20181108
Lead Channel Impedance Value: 1083 Ohm
Lead Channel Impedance Value: 1121 Ohm
Lead Channel Impedance Value: 1178 Ohm
Lead Channel Impedance Value: 204.14 Ohm
Lead Channel Impedance Value: 216.848
Lead Channel Impedance Value: 263.855
Lead Channel Impedance Value: 272.032
Lead Channel Impedance Value: 295.076
Lead Channel Impedance Value: 399 Ohm
Lead Channel Impedance Value: 418 Ohm
Lead Channel Impedance Value: 418 Ohm
Lead Channel Impedance Value: 475 Ohm
Lead Channel Impedance Value: 551 Ohm
Lead Channel Impedance Value: 589 Ohm
Lead Channel Impedance Value: 646 Ohm
Lead Channel Impedance Value: 703 Ohm
Lead Channel Impedance Value: 760 Ohm
Lead Channel Impedance Value: 779 Ohm
Lead Channel Pacing Threshold Amplitude: 0.375 V
Lead Channel Pacing Threshold Amplitude: 0.5 V
Lead Channel Pacing Threshold Amplitude: 2.75 V
Lead Channel Pacing Threshold Pulse Width: 0.4 ms
Lead Channel Pacing Threshold Pulse Width: 0.4 ms
Lead Channel Pacing Threshold Pulse Width: 1 ms
Lead Channel Sensing Intrinsic Amplitude: 11.75 mV
Lead Channel Sensing Intrinsic Amplitude: 11.75 mV
Lead Channel Sensing Intrinsic Amplitude: 3.125 mV
Lead Channel Sensing Intrinsic Amplitude: 3.125 mV
Lead Channel Setting Pacing Amplitude: 2 V
Lead Channel Setting Pacing Amplitude: 2.5 V
Lead Channel Setting Pacing Amplitude: 2.75 V
Lead Channel Setting Pacing Pulse Width: 0.4 ms
Lead Channel Setting Pacing Pulse Width: 1 ms
Lead Channel Setting Sensing Sensitivity: 0.3 mV

## 2021-01-31 NOTE — Telephone Encounter (Signed)
ICM Call to patient.  He reports his eye doctor has not received cardiac clearance to do eye surgery yet.  He would like to know how long to hold Eliquis and if someone could send the clearance to his eye doctor.  Advised will have Dr York Cerise nurse give him a call back.

## 2021-01-31 NOTE — Progress Notes (Signed)
EPIC Encounter for ICM Monitoring  Patient Name: [name] is a 75 y.o. male Date: 01/31/2021 Primary Care Physican: Biagio Borg, MD Primary Cardiologist: Burt Knack Electrophysiologist: Curt Bears Bi-V Pacing:   98.3%       11/09/2020 Office Weight: 197 lbs     Time in AT/AF  0.0 hr/day (0.0%)                                                          Spoke with patient and heart failure questions reviewed.  Pt asymptomatic for fluid accumulation and feeing well.    He asked about cardiac clearance for his eye MD.  Phone note sent to [facility] street triage for follow up.    Optivol thoracic impedance normal.    Takes Spirolactone 25 mg take 0.5 tablet daily.   Recommendations: No changes and encouraged to call if experiencing any fluid symptoms.   Follow-up plan: ICM clinic phone appointment on 03/05/2021.   91 day device clinic remote transmission 02/01/2021.     EP/Cardiology Office Visits:  Recall 06/21/2021 with Dr Burt Knack.  Recall 07/05/2021 with Dr. Curt Bears.     Copy of ICM check sent to Dr. Curt Bears.     3 month ICM trend: 01/31/2021.    1 Year ICM trend:       [name], RN 01/31/2021 3:04 PM

## 2021-02-01 ENCOUNTER — Ambulatory Visit (INDEPENDENT_AMBULATORY_CARE_PROVIDER_SITE_OTHER): Payer: Medicare Other

## 2021-02-01 DIAGNOSIS — I255 Ischemic cardiomyopathy: Secondary | ICD-10-CM

## 2021-02-01 LAB — CUP PACEART REMOTE DEVICE CHECK
Battery Remaining Longevity: 31 mo
Battery Voltage: 2.94 V
Brady Statistic AP VP Percent: 0.12 %
Brady Statistic AP VS Percent: 0.02 %
Brady Statistic AS VP Percent: 98.57 %
Brady Statistic AS VS Percent: 1.3 %
Brady Statistic RA Percent Paced: 0.13 %
Brady Statistic RV Percent Paced: 42.73 %
Date Time Interrogation Session: 20220915084348
HighPow Impedance: 66 Ohm
Implantable Lead Implant Date: 20181108
Implantable Lead Implant Date: 20181108
Implantable Lead Implant Date: 20181108
Implantable Lead Location: 753858
Implantable Lead Location: 753859
Implantable Lead Location: 753860
Implantable Lead Model: 4298
Implantable Lead Model: 5076
Implantable Pulse Generator Implant Date: 20181108
Lead Channel Impedance Value: 1083 Ohm
Lead Channel Impedance Value: 1140 Ohm
Lead Channel Impedance Value: 1178 Ohm
Lead Channel Impedance Value: 193.707
Lead Channel Impedance Value: 205.114
Lead Channel Impedance Value: 250.371
Lead Channel Impedance Value: 276.523
Lead Channel Impedance Value: 300.368
Lead Channel Impedance Value: 361 Ohm
Lead Channel Impedance Value: 418 Ohm
Lead Channel Impedance Value: 418 Ohm
Lead Channel Impedance Value: 475 Ohm
Lead Channel Impedance Value: 551 Ohm
Lead Channel Impedance Value: 589 Ohm
Lead Channel Impedance Value: 608 Ohm
Lead Channel Impedance Value: 665 Ohm
Lead Channel Impedance Value: 703 Ohm
Lead Channel Impedance Value: 817 Ohm
Lead Channel Pacing Threshold Amplitude: 0.5 V
Lead Channel Pacing Threshold Amplitude: 0.5 V
Lead Channel Pacing Threshold Amplitude: 3 V
Lead Channel Pacing Threshold Pulse Width: 0.4 ms
Lead Channel Pacing Threshold Pulse Width: 0.4 ms
Lead Channel Pacing Threshold Pulse Width: 1 ms
Lead Channel Sensing Intrinsic Amplitude: 10.625 mV
Lead Channel Sensing Intrinsic Amplitude: 10.625 mV
Lead Channel Sensing Intrinsic Amplitude: 2.75 mV
Lead Channel Sensing Intrinsic Amplitude: 2.75 mV
Lead Channel Setting Pacing Amplitude: 2 V
Lead Channel Setting Pacing Amplitude: 2.5 V
Lead Channel Setting Pacing Amplitude: 2.75 V
Lead Channel Setting Pacing Pulse Width: 0.4 ms
Lead Channel Setting Pacing Pulse Width: 1 ms
Lead Channel Setting Sensing Sensitivity: 0.3 mV

## 2021-02-01 NOTE — Telephone Encounter (Addendum)
Preoperative documentation provided 01/11/2021 by Kathyrn Drown, NP as below.  Will attempt to refax to [phone] via epic fax function and remove from preop pool.  We will also route to call back pool, as hardcopy fax may need to be sent at this time.  Signed, Arvil Chaco, PA-C 02/01/2021, 3:48 PM

## 2021-02-01 NOTE — Telephone Encounter (Signed)
I have re-faxed hard copy recommendations and received confirmation that it went through.

## 2021-02-02 ENCOUNTER — Telehealth: Payer: Self-pay

## 2021-02-02 ENCOUNTER — Other Ambulatory Visit: Payer: Self-pay | Admitting: Rheumatology

## 2021-02-02 ENCOUNTER — Encounter: Payer: Self-pay | Admitting: Gastroenterology

## 2021-02-02 ENCOUNTER — Other Ambulatory Visit (INDEPENDENT_AMBULATORY_CARE_PROVIDER_SITE_OTHER): Payer: Medicare Other

## 2021-02-02 ENCOUNTER — Ambulatory Visit (INDEPENDENT_AMBULATORY_CARE_PROVIDER_SITE_OTHER): Payer: Medicare Other | Admitting: Gastroenterology

## 2021-02-02 VITALS — BP 100/60 | HR 92 | Ht 66.0 in | Wt 192.0 lb

## 2021-02-02 DIAGNOSIS — K317 Polyp of stomach and duodenum: Secondary | ICD-10-CM

## 2021-02-02 DIAGNOSIS — R945 Abnormal results of liver function studies: Secondary | ICD-10-CM | POA: Diagnosis not present

## 2021-02-02 DIAGNOSIS — M1990 Unspecified osteoarthritis, unspecified site: Secondary | ICD-10-CM

## 2021-02-02 DIAGNOSIS — R198 Other specified symptoms and signs involving the digestive system and abdomen: Secondary | ICD-10-CM

## 2021-02-02 DIAGNOSIS — D135 Benign neoplasm of extrahepatic bile ducts: Secondary | ICD-10-CM

## 2021-02-02 LAB — CBC
HCT: 43 % (ref 39.0–52.0)
Hemoglobin: 14.4 g/dL (ref 13.0–17.0)
MCHC: 33.5 g/dL (ref 30.0–36.0)
MCV: 89 fl (ref 78.0–100.0)
Platelets: 249 K/uL (ref 150.0–400.0)
RBC: 4.83 Mil/uL (ref 4.22–5.81)
RDW: 13.8 % (ref 11.5–15.5)
WBC: 6.3 K/uL (ref 4.0–10.5)

## 2021-02-02 LAB — BASIC METABOLIC PANEL
BUN: 20 mg/dL (ref 6–23)
CO2: 27 mEq/L (ref 19–32)
Calcium: 9.4 mg/dL (ref 8.4–10.5)
Chloride: 104 mEq/L (ref 96–112)
Creatinine, Ser: 0.94 mg/dL (ref 0.40–1.50)
GFR: 78.52 mL/min (ref >60.00)
Glucose, Bld: 123 mg/dL — ABNORMAL HIGH (ref 70–99)
Potassium: 4.2 mEq/L (ref 3.5–5.1)
Sodium: 139 mEq/L (ref 135–145)

## 2021-02-02 LAB — PROTIME-INR
INR: 1.4 ratio — ABNORMAL HIGH (ref 0.8–1.0)
Prothrombin Time: 15.1 s — ABNORMAL HIGH (ref 9.6–13.1)

## 2021-02-02 NOTE — Patient Instructions (Addendum)
Your provider has requested that you go to the basement level for lab work before leaving today. Press "B" on the elevator. The lab is located at the first door on the left as you exit the elevator.  You have been scheduled for an endoscopy. Please follow written instructions given to you at your visit today. If you use inhalers (even only as needed), please bring them with you on the day of your procedure.  If you are age 77 or older, your body mass index should be between 23-30. Your Body mass index is 30.99 kg/m. If this is out of the aforementioned range listed, please consider follow up with your Primary Care Provider.   __________________________________________________________  The  GI providers would like to encourage you to use [facility] to communicate with providers for non-urgent requests or questions.  Due to long hold times on the telephone, sending your provider a message by [facility] may be a faster and more efficient way to get a response.  Please allow 48 business hours for a response.  Please remember that this is for non-urgent requests.   Thank you for choosing me and [facility].  Dr. Rush Landmark

## 2021-02-02 NOTE — Telephone Encounter (Signed)
Last Visit: 04/19/2021   Next Visit: 11/09/2020  Last Fill: 11/09/2020  Current Dose per office note on 11/09/2020: PLQ 200 mg 1 tablet po qd  Dx: Rheumatoid arthritis involving multiple sites with positive rheumatoid factor   Labs: 11/09/2020 CBC and CMP are normal.  PLQ Eye Exam: 03/09/2020 normal.  Okay to refill PLQ?

## 2021-02-02 NOTE — Progress Notes (Signed)
[place] VISIT   Primary Care Provider Biagio Borg, MD [place] [address] [phone]  Referring Provider Dr. Ardis Hughs  Patient Profile: [name] is a 75 y.o. 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  The patient presents to the [facility] for an evaluation and management of problem(s) noted below:  Problem List 1. Ampullary adenoma   2. Abnormal findings on esophagogastroduodenoscopy (EGD)   3. Polyp of duodenum     History of Present Illness Please see prior progress notes by Dr. Ardis Hughs for full details of HPI.  Interval History The patient has a history of a previous duodenal adenoma that was resected.  History goes back to 2010 with a endoscopically resected tubulovillous adenoma.  Repeat procedure in 2012 found some residual tubular adenoma that was removed endoscopically.  This was followed up over the course of time in 06/2014 and again in 2019 with what looked to be recurrent polypoid growth in the duodenum.  More recent endoscopy was performed this year and there is concern for recurrence and now potential ampullary involvement.  This last endoscopy no biopsies were obtained.  It is for this reason that the patient is referred for consideration of ampullectomy endoscopically.  The patient continues to have GI symptoms that he has seen Dr. Ardis Hughs for.  He wonders if this potential ampullary adenoma is causing any of his symptoms.  Patient denies taking any excessive nonsteroidals or BC/Goody powders.  The patient denies any issues with jaundice, scleral icterus, generalized pruritus, darkened/amber urine, clay-colored stools, LE edema, hematemesis, coffee-ground emesis, abdominal distention,  confusion.  GI Review of Systems Positive as above Negative for current dysphagia, odynophagia, melena, hematochezia  Review of Systems General: Denies fevers/chills/weight loss unintentionally Cardiovascular: Denies chest pain/palpitations Pulmonary: Denies shortness of breath Gastroenterological: See HPI Genitourinary: Denies darkened urine  Hematological: Positive for history of easy bruising/bleeding due to anticoagulation Dermatological: Denies jaundice Psychological: Mood is stable   Medications Current Outpatient Medications  Medication Sig Dispense Refill   acetaminophen (TYLENOL) 500 MG tablet Take 500 mg by mouth 2 (two) times daily.     atorvastatin (LIPITOR) 20 MG tablet TAKE 1 TABLET BY MOUTH DAILY 90 tablet 3   b complex vitamins tablet Take 1 tablet by mouth daily with lunch.     carvedilol (COREG) 25 MG tablet TAKE 1 TABLET BY MOUTH 2 TIMES DAILY. GENERIC EQUIVALENT FOR COREG. 180 tablet 3   CINNAMON PO Take 1 tablet by mouth daily with lunch.      Coenzyme Q10 (CO Q 10) 100 MG CAPS Take 1 capsule by mouth daily with lunch.     diclofenac sodium (VOLTAREN) 1 % GEL Apply 1 application topically 4 (four) times daily as needed (pain).   3   ELIQUIS 5 MG TABS tablet TAKE 1 TABLET BY MOUTH TWICE DAILY 180 tablet 1   ENTRESTO 49-51 MG TAKE 1 TABLET BY MOUTH TWICE DAILY 99991111 tablet 3   folic acid (FOLVITE) A999333 MCG tablet Take 400 mcg by mouth daily.     Glucosamine HCl (GLUCOSAMINE PO) Take 1,500 mg by mouth every evening.     hydroCHLOROthiazide (MICROZIDE PO) hydrochlorothiazide     hydrocortisone 2.5 % cream Apply 1 application topically 2 (two) times daily as needed (rash on nose).  Multiple Vitamin (MULTIVITAMIN) capsule Take 1 capsule by mouth daily.       Multiple Vitamins-Minerals (OCUVITE PO) Take 1 tablet by mouth at bedtime.     NON FORMULARY CPAP machine with sleep.     pantoprazole (PROTONIX) 40 MG tablet TAKE 1 TABLET BY MOUTH EVERY DAY BEFORE BREAKFAST  90 tablet 3   Probiotic Product (PROBIOTIC DAILY PO) Take 1 capsule by mouth daily with lunch.      RESTASIS 0.05 % ophthalmic emulsion Place 1 drop into both eyes 2 (two) times daily.     Saw Palmetto, Serenoa repens, (SAW PALMETTO PO) Take 1 capsule by mouth every evening.     spironolactone (ALDACTONE) 25 MG tablet TAKE 1/2 TABLET(12.5 MG) BY MOUTH DAILY 45 tablet 2   tamsulosin (FLOMAX) 0.4 MG CAPS capsule Take 0.4 mg by mouth as needed.     Testosterone 20 % CREA Apply 2 mLs topically daily. Rub on shoulder     traMADol (ULTRAM) 50 MG tablet TAKE 1 TABLET(50 MG) BY MOUTH AT BEDTIME AS NEEDED 30 tablet 0   TURMERIC PO Take 1 tablet by mouth daily with lunch.      hydroxychloroquine (PLAQUENIL) 200 MG tablet TAKE 1 TABLET(200 MG) BY MOUTH DAILY 90 tablet 0   nitroGLYCERIN (NITROSTAT) 0.4 MG SL tablet Place 1 tablet (0.4 mg total) under the tongue every 5 (five) minutes x 3 doses as needed for chest pain. (Patient not taking: Reported on 02/02/2021) 25 tablet 0   No current facility-administered medications for this visit.    Allergies Allergies  Allergen Reactions   Sulfonamide Derivatives Rash         Histories Past Medical History:  Diagnosis Date   AICD (automatic cardioverter/defibrillator) present    Allergic rhinitis    Allergy    Anginal pain ([place]) 05/26/14   chest pain after chasing dog   Atypical nevus of back 04/27/2003   moderate - mid lower back   Basal cell carcinoma 01/26/2008   right cheek - MOHs   CAD (coronary artery disease)    hx of stent- 2005 RCA   Cataract    beginning stage both eyes   CHF (congestive heart failure) (HCC)    pacemaker Medtronic     Chronic back pain    intermittent   Constipation    Dizziness    Dysrhythmia    right bundle branch block    Esophageal stricture    GERD (gastroesophageal reflux disease)    Hemoptysis    Hiatal hernia    History of colonic polyps    hyperplastic   HTN (hypertension)    Hyperlipidemia     Hypertrophy of prostate with urinary obstruction and other lower urinary tract symptoms (LUTS)    OA (osteoarthritis)    OSA (obstructive sleep apnea)    cpap- 10    Other specified disorder of stomach and duodenum    duodenal periampulary tubulovillous adenoma removed by Dr. Ardis Hughs 5/10   Pericarditis 07/06/2019   Pneumonia    Pre-diabetes    no medications   Shortness of breath    with exertion   Sleep apnea    cpap   Testicular hypofunction    Past Surgical History:  Procedure Laterality Date   BIV ICD INSERTION CRT-D N/A 03/27/2017   Procedure: BIV ICD INSERTION CRT-D;  Surgeon: Constance Haw, MD;  Location: [facility];  Service: Cardiovascular;  Laterality: N/A;   CARDIAC CATHETERIZATION     '05, last 2009, showing  patent RCA stent   COLONOSCOPY  12/2007   HYPERPLASTIC POLYP   COLONOSCOPY  12/2019   COLONOSCOPY WITH PROPOFOL N/A 06/02/2014   Procedure: COLONOSCOPY WITH PROPOFOL;  Surgeon: [name], MD;  Location: [facility];  Service: Endoscopy;  Laterality: N/A;   CORONARY ANGIOPLASTY  08/2003   ESOPHAGOGASTRODUODENOSCOPY (EGD) WITH PROPOFOL N/A 06/02/2014   Procedure: ESOPHAGOGASTRODUODENOSCOPY (EGD) WITH PROPOFOL;  Surgeon: [name], MD;  [facility]: [facility];  Service: Endoscopy;  Laterality: N/A;   hernia surgery x 3     Bilateral Inguinal, Umbicial   INSERTION OF MESH N/A 03/20/2018   Procedure: INSERTION OF MESH;  Surgeon: Alphonsa Overall, MD;  Location: [facility];  Service: General;  Laterality: N/A;   IRRIGATION AND DEBRIDEMENT ABSCESS Left 08/14/2012   Procedure: IRRIGATION AND DEBRIDEMENT LEFT INGUINAL BOIL ;  Surgeon: Ailene Rud, MD;  Location: [facility];  Service: Urology;  Laterality: Left;   JOINT REPLACEMENT Right 2017   KNEE ARTHROPLASTY     LEFT HEART CATH AND CORONARY ANGIOGRAPHY N/A 01/24/2019   Procedure: LEFT HEART CATH AND CORONARY ANGIOGRAPHY;  Surgeon: Martinique, Peter M, MD;  Location: [facility];  Service:  Cardiovascular;  Laterality: N/A;   LEFT HEART CATHETERIZATION WITH CORONARY ANGIOGRAM N/A 05/26/2014   Procedure: LEFT HEART CATHETERIZATION WITH CORONARY ANGIOGRAM;  Surgeon: Blane Ohara, MD;  Location: [facility];  Service: Cardiovascular;  Laterality: N/A;   LUMBAR LAMINECTOMY/DECOMPRESSION MICRODISCECTOMY Left 09/26/2014   Procedure: Lumbar Laminectomy for resection of synovial cyst  Lumbar five- sacral one left;  Surgeon: Kary Kos, MD;  Location: [facility];  Service: Neurosurgery;  Laterality: Left;   MOLE REMOVAL Left 03/20/2018   Procedure: MOLE REMOVAL;  Surgeon: Alphonsa Overall, MD;  Location: [facility];  Service: General;  Laterality: Left;   Oral surg to removed growth from ?sinus  10/2010   ?Dermoid removed by DrRiggs   PACEMAKER INSERTION  04/03/2017   POLYPECTOMY     RCA stenting     '05 RCA   right toe surgery  Right    Cyst    RIGHT/LEFT HEART CATH AND CORONARY ANGIOGRAPHY N/A 01/15/2017   Procedure: RIGHT/LEFT HEART CATH AND CORONARY ANGIOGRAPHY;  Surgeon: [name], MD;  Location: [facility];  Service: Cardiovascular;  Laterality: N/A;   s/p right knee arthroscopy  2005   thumb surgery Right    TOTAL KNEE ARTHROPLASTY Right 03/12/2016   Procedure: RIGHT TOTAL KNEE ARTHROPLASTY;  Surgeon: Paralee Cancel, MD;  [facility]: [facility];  Service: Orthopedics;  Laterality: Right;   UPPER GASTROINTESTINAL ENDOSCOPY     VENTRAL HERNIA REPAIR N/A 03/20/2018   Procedure: LAPAROSCOPIC VENTRAL INCISIONAL  HERNIA ERAS PATHWAY;  Surgeon: Alphonsa Overall, MD;  Location: [facility];  Service: General;  Laterality: N/A;   Social History   Socioeconomic History   Marital status: Married    Spouse name: Not on file   Number of children: 1   Years of education: Not on file   Highest education level: Not on file  Occupational History   Occupation: Charity fundraiser    Employer: [employer]  Tobacco Use   Smoking status: Former    Types: Cigarettes    Quit date: 05/20/1990     Years since quitting: 30.7   Smokeless tobacco: Never   Tobacco comments:    previous 30 pack year history  Vaping Use   Vaping Use: Never used  Substance and Sexual Activity   Alcohol use: Yes  Comment: rare   Drug use: Never   Sexual activity: Not on file  Other Topics Concern   Not on file  Social History Narrative   Not on file   Social Determinants of Health   Financial Resource Strain: Not on file  Food Insecurity: Not on file  Transportation Needs: Not on file  Physical Activity: Not on file  Stress: Not on file  Social Connections: Not on file  Intimate Partner Violence: Not on file   Family History  Problem Relation Age of Onset   Heart disease Mother    Coronary artery disease Father    Diabetes Father    Sudden death Father        due to heart disease   Heart disease Father    COPD Brother    Arthritis Brother    Heart failure Brother    Cancer Brother        lung    Stomach cancer Paternal Grandmother    Arthritis Daughter    Hypertension Other    Colon cancer Neg Hx    Esophageal cancer Neg Hx    Colon polyps Neg Hx    Ulcerative colitis Neg Hx    Liver disease Neg Hx    Pancreatic cancer Neg Hx    Inflammatory bowel disease Neg Hx    Rectal cancer Neg Hx    I have reviewed his medical, social, and family history in detail and updated the electronic medical record as necessary.    PHYSICAL EXAMINATION  BP 100/60 (BP Location: Left Arm, Patient Position: Sitting, Cuff Size: Normal)   Pulse 92   Ht '5\' 6"'$  (1.676 m) Comment: height measured without shoes  Wt 192 lb (87.1 kg)   BMI 30.99 kg/m  Wt Readings from Last 3 Encounters:  02/02/21 192 lb (87.1 kg)  11/09/20 196 lb 6.4 oz (89.1 kg)  11/08/20 196 lb 6.4 oz (89.1 kg)  GEN: NAD, appears stated age, doesn't appear chronically ill PSYCH: Cooperative, without pressured speech EYE: Conjunctivae pink, sclerae anicteric ENT: MMM CV: Nontachycardic RESP: No audible wheezing GI: NABS, soft,  NT/ND, without rebound or guarding MSK/EXT: No significant lower extremity edema SKIN: No jaundice NEURO:  Alert & Oriented x 3, no focal deficits   REVIEW OF DATA  I reviewed the following data at the time of this encounter:  GI Procedures and Studies  August 2022 EGD - Protuberant ampullary polypoid lesion, measuring 1cm. Appears different from previous examinations, seems to clearly involve the ampullary orifice, see images. - Small hiatal hernia. - Numerous fleshy, typical appearing fundic gland polyps throughout stomach measuring 27m to 1cm. - The examination was otherwise normal.  Laboratory Studies  Reviewed those in epic and care everywhere  Imaging Studies  August 2022 CTAP IMPRESSION: No discrete lesion visible in the gastrointestinal tract by CT. Tortuous appearance of the duodenum as described, this may explain difficulties with endoscopy. Hepatic steatosis. Partial herniation of the urinary bladder into the LEFT inguinal canal, a stable finding since prior imaging from 2021.  Aortic atherosclerosis. Aortic Atherosclerosis (ICD10-I70.0).   ASSESSMENT  Mr. PSirakis a 75y.o. 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  The patient is seen today for evaluation and management of:  1. Ampullary adenoma   2. Abnormal findings on esophagogastroduodenoscopy (EGD)   3. Polyp of duodenum  The patient is hemodynamically stable.  Clinically seems to be stable as well but he continues to have GI issues for which Dr. Ardis Hughs is working things up further  The lesion that appears near the ampulla or involving the ampulla it does look like a potential adenoma.  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  The patient is referred for consideration of endoscopic resection via ampullectomy of an ampullary adenoma.  Based upon the description and endoscopic pictures I do feel that it is reasonable to pursue an Advanced Polypectomy attempt of the polyp/lesion.  We discussed some of the techniques of endoscopic ampullectomy.  The risks of an ERCP were discussed at length, including but not limited to the risk of perforation, bleeding, abdominal pain, post-ERCP pancreatitis (while usually mild can be severe and even life threatening).  My approach is such that we attempt both pancreatic duct and biliary duct cannulation if possible with small sphincterotomies to hopefully I have increased chance of access post ampullectomy of stent placement.  If unsuccessful cannulation then proceed with ampullectomy with hope that we are able to find the pancreatic orifice and biliary orifice post procedure with rectal indomethacin no matter what given to decrease risk of post ERCP pancreatitis.  During attempts at endoscopic ampullectomy, the risks of bleeding and perforation/leak and pancreatitis are increased as opposed to normal ERCPs, and that was discussed with the patient as well.   In addition, I explained that en-bloc resection is ideal but not always possible.  Subsequent short-interval endoscopic evaluation for follow up and potential retreatment of the lesion/area may be necessary.  I did offer, a referral to surgery in order for patient to have opportunity to discuss surgical ampullectomy/intervention prior to finalizing decision for attempt at endoscopic removal.  The patient feels that he wants to proceed with endoscopic management most likely but does think a surgical referral would be helpful so that he may hear his options.  If, after endoscopic attempt at removal of the polyp, it is found that the patient has a complication or that an invasive  lesion or malignant lesion is found, or that the polyp continues to recur, the patient is aware and understands that surgery may still be indicated/required.  All patient questions were answered, to the best of my ability, and the patient agrees to the aforementioned plan of action with follow-up as indicated.   PLAN  Preprocedure labs to be obtained Proceed with scheduling EUS/ERCP for attempt at ampullary lesion resection Follow-up to be dictated based on results   Orders Placed This Encounter  Procedures   Procedural/ Surgical Case Request: UPPER ESOPHAGEAL ENDOSCOPIC ULTRASOUND (EUS), ENDOSCOPIC RETROGRADE CHOLANGIOPANCREATOGRAPHY (ERCP) WITH PROPOFOL   CBC   Basic Metabolic Panel (BMET)   INR/PT   Ambulatory referral to Gastroenterology    New Prescriptions   No medications on file   Modified Medications   Modified Medication Previous Medication   HYDROXYCHLOROQUINE (PLAQUENIL) 200 MG TABLET hydroxychloroquine (PLAQUENIL) 200 MG tablet      TAKE 1 TABLET(200 MG) BY MOUTH DAILY    Take 1 tablet (200 mg total) by mouth daily.    Planned Follow Up No follow-ups on file.   Total Time in Face-to-Face and in Coordination of Care for patient including independent/personal interpretation/review of prior testing, medical history, examination, medication adjustment, communicating results with the patient directly, and documentation  with the EHR is 35 minutes.   Justice Britain, MD [facility] Advanced [facility]oscopy Office # [phone]

## 2021-02-02 NOTE — Telephone Encounter (Signed)
Request for surgical clearance:     Endoscopy Procedure  What type of surgery is being performed?     EUS+ERCP   When is this surgery scheduled?     03/19/21  What type of clearance is required ?   Pharmacy  Are there any medications that need to be held prior to surgery and how long? Eliquis x2 days prior to procedure.  Practice name and name of physician performing surgery?      [facility]-Dr Mansouraty   What is your office phone and fax number?      Phone- [phone]  [phone]  Anesthesia type (None, local, MAC, general) ?       MAC

## 2021-02-03 ENCOUNTER — Encounter: Payer: Self-pay | Admitting: Gastroenterology

## 2021-02-03 DIAGNOSIS — R198 Other specified symptoms and signs involving the digestive system and abdomen: Secondary | ICD-10-CM | POA: Insufficient documentation

## 2021-02-03 DIAGNOSIS — D135 Benign neoplasm of extrahepatic bile ducts: Secondary | ICD-10-CM | POA: Insufficient documentation

## 2021-02-03 DIAGNOSIS — K317 Polyp of stomach and duodenum: Secondary | ICD-10-CM | POA: Insufficient documentation

## 2021-02-06 ENCOUNTER — Other Ambulatory Visit: Payer: Self-pay | Admitting: Rheumatology

## 2021-02-07 NOTE — Telephone Encounter (Signed)
Patient with diagnosis of afib on Eliquis for anticoagulation.    Procedure: endoscopy and ERPC Date of procedure: 10/31/2  CHA2DS2-VASc Score = 5  This indicates a 7.2% annual risk of stroke. The patient's score is based upon: CHF History: 1 HTN History: 1 Diabetes History: 0 Stroke History: 0 Vascular Disease History: 1 Age Score: 2 Gender Score: 0   CrCl 66mL/min using adjusted body weight Platelet count 249K  Per office protocol, patient can hold Eliquis for 2 days prior to procedure.

## 2021-02-07 NOTE — Telephone Encounter (Signed)
Last Visit: 04/19/2021   Next Visit: 11/09/2020   UDS:11/09/2020 UDS is consistent with tramadol use.   Narc Agreement: 11/09/2020   Last Fill: 01/03/2021 tramadol 50mg  1 tablet by mouth at bedtime as needed.   Okay to refill Tramadol?

## 2021-02-07 NOTE — Telephone Encounter (Signed)
   Name: [name]  DOB: [birth year]  MRN: [id number]   Primary Cardiologist: [name], MD  Chart reviewed as part of pre-operative protocol coverage. Patient was contacted 02/07/2021 in reference to pre-operative risk assessment for pending surgery as outlined below.  [name] was last seen in 06/2020 by Dr. Burt Knack and Dr. Curt Bears, doing well at those visits, history reviewed. RCRI 6.6% indicating moderate CV risk. He was recently cleared for an unrelated procedure in August. I reached out to patient for update on how he is doing. The patient affirms he has been doing well without any new cardiac symptoms. Therefore, based on ACC/AHA guidelines, the patient would be at acceptable risk for the planned procedure without further cardiovascular testing. The patient was advised that if he develops new symptoms prior to surgery to contact our office to arrange for a follow-up visit, and he verbalized understanding.  Per pharmacy team, Per office protocol, patient can hold Eliquis for 2 days prior to procedure.    I will route this recommendation to the requesting party via Epic fax function and remove from pre-op pool. Please call with questions.  Charlie Pitter, PA-C 02/07/2021, 2:15 PM

## 2021-02-08 DIAGNOSIS — L820 Inflamed seborrheic keratosis: Secondary | ICD-10-CM | POA: Diagnosis not present

## 2021-02-08 DIAGNOSIS — D485 Neoplasm of uncertain behavior of skin: Secondary | ICD-10-CM | POA: Diagnosis not present

## 2021-02-08 NOTE — Telephone Encounter (Signed)
I have spoken to patient to advise that he may hold eliquis 2 days prior to his upcoming procedure per cardiology. Patient verbalizes understanding and indicates that cardiology has also been in touch with him about this.

## 2021-02-08 NOTE — Progress Notes (Signed)
Remote ICD transmission.   

## 2021-02-08 NOTE — Telephone Encounter (Signed)
Left message for patient to call back  

## 2021-02-14 DIAGNOSIS — L812 Freckles: Secondary | ICD-10-CM | POA: Diagnosis not present

## 2021-02-14 DIAGNOSIS — M2021 Hallux rigidus, right foot: Secondary | ICD-10-CM | POA: Diagnosis not present

## 2021-02-14 DIAGNOSIS — L821 Other seborrheic keratosis: Secondary | ICD-10-CM | POA: Diagnosis not present

## 2021-02-14 DIAGNOSIS — M79674 Pain in right toe(s): Secondary | ICD-10-CM | POA: Diagnosis not present

## 2021-02-14 DIAGNOSIS — M2031 Hallux varus (acquired), right foot: Secondary | ICD-10-CM | POA: Diagnosis not present

## 2021-02-14 DIAGNOSIS — L738 Other specified follicular disorders: Secondary | ICD-10-CM | POA: Diagnosis not present

## 2021-02-14 DIAGNOSIS — L570 Actinic keratosis: Secondary | ICD-10-CM | POA: Diagnosis not present

## 2021-02-14 DIAGNOSIS — M2022 Hallux rigidus, left foot: Secondary | ICD-10-CM | POA: Insufficient documentation

## 2021-02-14 DIAGNOSIS — L820 Inflamed seborrheic keratosis: Secondary | ICD-10-CM | POA: Diagnosis not present

## 2021-02-15 DIAGNOSIS — M25562 Pain in left knee: Secondary | ICD-10-CM | POA: Diagnosis not present

## 2021-02-16 DIAGNOSIS — Z23 Encounter for immunization: Secondary | ICD-10-CM | POA: Diagnosis not present

## 2021-02-28 ENCOUNTER — Other Ambulatory Visit: Payer: Self-pay | Admitting: Cardiology

## 2021-03-05 ENCOUNTER — Ambulatory Visit (INDEPENDENT_AMBULATORY_CARE_PROVIDER_SITE_OTHER): Payer: Medicare Other

## 2021-03-05 DIAGNOSIS — I5022 Chronic systolic (congestive) heart failure: Secondary | ICD-10-CM | POA: Diagnosis not present

## 2021-03-05 DIAGNOSIS — Z95810 Presence of automatic (implantable) cardiac defibrillator: Secondary | ICD-10-CM

## 2021-03-08 ENCOUNTER — Encounter (HOSPITAL_COMMUNITY): Payer: Self-pay | Admitting: Gastroenterology

## 2021-03-08 DIAGNOSIS — H53021 Refractive amblyopia, right eye: Secondary | ICD-10-CM | POA: Diagnosis not present

## 2021-03-08 DIAGNOSIS — H2513 Age-related nuclear cataract, bilateral: Secondary | ICD-10-CM | POA: Diagnosis not present

## 2021-03-08 DIAGNOSIS — Z79899 Other long term (current) drug therapy: Secondary | ICD-10-CM | POA: Diagnosis not present

## 2021-03-08 DIAGNOSIS — M0569 Rheumatoid arthritis of multiple sites with involvement of other organs and systems: Secondary | ICD-10-CM | POA: Diagnosis not present

## 2021-03-08 DIAGNOSIS — H35033 Hypertensive retinopathy, bilateral: Secondary | ICD-10-CM | POA: Diagnosis not present

## 2021-03-09 DIAGNOSIS — Z23 Encounter for immunization: Secondary | ICD-10-CM | POA: Diagnosis not present

## 2021-03-09 NOTE — Progress Notes (Signed)
EPIC Encounter for ICM Monitoring  Patient Name: [name] is a 75 y.o. male Date: 03/09/2021 Primary Care Physican: Biagio Borg, MD Primary Cardiologist: Burt Knack Electrophysiologist: Curt Bears Bi-V Pacing:   98.2%       Last Weight: 197 lbs     Time in AT/AF  <0.1 hr/day (<0.1%)                                                          Spoke with patient and heart failure questions reviewed.  Pt asymptomatic for fluid accumulation and feeling well.   Optivol thoracic impedance normal.    Takes Spirolactone 25 mg take 0.5 tablet daily.   Recommendations:  No changes and encouraged to call if experiencing any fluid symptoms.   Follow-up plan: ICM clinic phone appointment on 04/16/2021.   91 day device clinic remote transmission 05/03/2021.     EP/Cardiology Office Visits:  Recall 06/21/2021 with Dr Burt Knack.  Recall 07/05/2021 with Dr. Curt Bears.     Copy of ICM check sent to Dr. Curt Bears.      3 month ICM trend: 03/05/2021.    1 Year ICM trend:       [name], RN 03/09/2021 10:26 AM

## 2021-03-13 ENCOUNTER — Other Ambulatory Visit: Payer: Self-pay | Admitting: Rheumatology

## 2021-03-14 NOTE — Telephone Encounter (Signed)
Last Visit: 04/19/2021   Next Visit: 11/09/2020   UDS:11/09/2020 UDS is consistent with tramadol use.   Narc Agreement: 11/09/2020   Last Fill: 02/07/2021 tramadol 50mg  1 tablet by mouth at bedtime as needed.   Okay to refill Tramadol?

## 2021-03-18 NOTE — Anesthesia Preprocedure Evaluation (Addendum)
Anesthesia Evaluation  Patient identified by MRN, date of birth, ID band Patient awake    Reviewed: Allergy & Precautions, NPO status , Patient's Chart, lab work & pertinent test results  History of Anesthesia Complications Negative for: history of anesthetic complications  Airway Mallampati: II  TM Distance: >3 FB Neck ROM: Limited    Dental no notable dental hx. (+) Dental Advisory Given   Pulmonary sleep apnea and Continuous Positive Airway Pressure Ventilation , former smoker,    Pulmonary exam normal        Cardiovascular hypertension, Pt. on medications and Pt. on home beta blockers + CAD, + Cardiac Stents (2006) and +CHF  Normal cardiovascular exam+ pacemaker + Cardiac Defibrillator   TEE IMPRESSIONS    1. Left ventricular ejection fraction, by estimation, is 50 to 55%. The  left ventricle has low normal function. The left ventricle has no regional  wall motion abnormalities. There is moderate concentric left ventricular  hypertrophy. Left ventricular  diastolic parameters are indeterminate.  2. Right ventricular systolic function is normal. The right ventricular  size is normal. There is normal pulmonary artery systolic pressure.  3. Left atrial size was mildly dilated.  4. The mitral valve is normal in structure. Mild mitral valve  regurgitation. No evidence of mitral stenosis.  5. The aortic valve is tricuspid. There is mild calcification of the  aortic valve. Aortic valve regurgitation is not visualized. No aortic  stenosis is present.   Comparison(s): No significant change from prior study.   TTE 06/2017 - Left ventricle: The cavity size was normal. Systolic function was normal. The estimated ejection fraction was in the range of 55% to 60%. Wall motion was normal; there were no regional wall motion abnormalities. Doppler parameters are consistent with abnormal left ventricular relaxation (grade 1 diastolic  dysfunction). - Ventricular septum: Septal motion showed dyssynergy. These changes are consistent with a left bundle branch block. - Aortic valve: There was trivial regurgitation. - Atrial septum: There was increased thickness of the septum,consistent with lipomatous hypertrophy.  RHC/LHC 2018 1. Widely patent coronary arteries with continued patency of the stented segment in the right coronary artery 2. Mild nonobstructive coronary artery disease as detailed with mild stenosis of the proximal to mid LAD and otherwise minimal luminal irregularities 3. Well compensated right-sided cardiac hemodynamics   Neuro/Psych negative neurological ROS  negative psych ROS   GI/Hepatic Neg liver ROS, GERD  Medicated,  Endo/Other  negative endocrine ROS  Renal/GU negative Renal ROS  negative genitourinary   Musculoskeletal  (+) Arthritis , Osteoarthritis,    Abdominal   Peds  Hematology negative hematology ROS (+)   Anesthesia Other Findings   Reproductive/Obstetrics                            Anesthesia Physical  Anesthesia Plan  ASA: 3  Anesthesia Plan: General   Post-op Pain Management:    Induction: Intravenous  PONV Risk Score and Plan: 2 and Treatment may vary due to age or medical condition and Ondansetron  Airway Management Planned: Oral ETT  Additional Equipment:   Intra-op Plan:   Post-operative Plan: Extubation in OR  Informed Consent: I have reviewed the patients History and Physical, chart, labs and discussed the procedure including the risks, benefits and alternatives for the proposed anesthesia with the patient or authorized representative who has indicated his/her understanding and acceptance.     Dental advisory given  Plan Discussed with: Anesthesiologist and CRNA  Anesthesia Plan Comments: (Cardiac clearance by Dr. Copper 01/02/2018: "Preoperative surgical clearance: The patient's functional status is greatly improved since he  underwent biventricular pacemaker placement.  His LV function has normalized.  He had patent coronary arteries at his most recent catheterization.  I think he can proceed with surgery at low risk of cardiac complications." Device orders on pt chart.)       Anesthesia Quick Evaluation

## 2021-03-19 ENCOUNTER — Telehealth: Payer: Self-pay

## 2021-03-19 ENCOUNTER — Ambulatory Visit (HOSPITAL_COMMUNITY): Payer: Medicare Other | Admitting: Anesthesiology

## 2021-03-19 ENCOUNTER — Encounter (HOSPITAL_COMMUNITY)
Admission: RE | Disposition: A | Discharge status: Home / Self Care | Payer: Self-pay | Source: Home / Self Care | Attending: Gastroenterology

## 2021-03-19 ENCOUNTER — Encounter (HOSPITAL_COMMUNITY): Payer: Self-pay | Admitting: Gastroenterology

## 2021-03-19 ENCOUNTER — Other Ambulatory Visit: Payer: Self-pay

## 2021-03-19 ENCOUNTER — Ambulatory Visit (HOSPITAL_COMMUNITY): Payer: Medicare Other

## 2021-03-19 ENCOUNTER — Ambulatory Visit (HOSPITAL_COMMUNITY)
Admission: RE | Admit: 2021-03-19 | Discharge: 2021-03-19 | Disposition: A | Discharge status: Home / Self Care | Payer: Medicare Other | Attending: Gastroenterology | Admitting: Gastroenterology

## 2021-03-19 DIAGNOSIS — K317 Polyp of stomach and duodenum: Secondary | ICD-10-CM | POA: Diagnosis not present

## 2021-03-19 DIAGNOSIS — Z95810 Presence of automatic (implantable) cardiac defibrillator: Secondary | ICD-10-CM | POA: Diagnosis not present

## 2021-03-19 DIAGNOSIS — G4733 Obstructive sleep apnea (adult) (pediatric): Secondary | ICD-10-CM | POA: Diagnosis not present

## 2021-03-19 DIAGNOSIS — G4730 Sleep apnea, unspecified: Secondary | ICD-10-CM | POA: Diagnosis not present

## 2021-03-19 DIAGNOSIS — Z882 Allergy status to sulfonamides status: Secondary | ICD-10-CM | POA: Insufficient documentation

## 2021-03-19 DIAGNOSIS — I509 Heart failure, unspecified: Secondary | ICD-10-CM | POA: Insufficient documentation

## 2021-03-19 DIAGNOSIS — Z87891 Personal history of nicotine dependence: Secondary | ICD-10-CM | POA: Insufficient documentation

## 2021-03-19 DIAGNOSIS — R198 Other specified symptoms and signs involving the digestive system and abdomen: Secondary | ICD-10-CM

## 2021-03-19 DIAGNOSIS — I2510 Atherosclerotic heart disease of native coronary artery without angina pectoris: Secondary | ICD-10-CM | POA: Insufficient documentation

## 2021-03-19 DIAGNOSIS — D135 Benign neoplasm of extrahepatic bile ducts: Secondary | ICD-10-CM

## 2021-03-19 DIAGNOSIS — K838 Other specified diseases of biliary tract: Secondary | ICD-10-CM | POA: Diagnosis not present

## 2021-03-19 DIAGNOSIS — Z955 Presence of coronary angioplasty implant and graft: Secondary | ICD-10-CM | POA: Diagnosis not present

## 2021-03-19 DIAGNOSIS — R195 Other fecal abnormalities: Secondary | ICD-10-CM

## 2021-03-19 DIAGNOSIS — C241 Malignant neoplasm of ampulla of Vater: Secondary | ICD-10-CM | POA: Diagnosis not present

## 2021-03-19 DIAGNOSIS — I899 Noninfective disorder of lymphatic vessels and lymph nodes, unspecified: Secondary | ICD-10-CM | POA: Diagnosis not present

## 2021-03-19 DIAGNOSIS — T85528A Displacement of other gastrointestinal prosthetic devices, implants and grafts, initial encounter: Secondary | ICD-10-CM

## 2021-03-19 DIAGNOSIS — K2289 Other specified disease of esophagus: Secondary | ICD-10-CM | POA: Diagnosis not present

## 2021-03-19 DIAGNOSIS — K449 Diaphragmatic hernia without obstruction or gangrene: Secondary | ICD-10-CM | POA: Insufficient documentation

## 2021-03-19 DIAGNOSIS — E785 Hyperlipidemia, unspecified: Secondary | ICD-10-CM | POA: Diagnosis not present

## 2021-03-19 DIAGNOSIS — I110 Hypertensive heart disease with heart failure: Secondary | ICD-10-CM | POA: Insufficient documentation

## 2021-03-19 DIAGNOSIS — K862 Cyst of pancreas: Secondary | ICD-10-CM

## 2021-03-19 DIAGNOSIS — K219 Gastro-esophageal reflux disease without esophagitis: Secondary | ICD-10-CM | POA: Diagnosis not present

## 2021-03-19 DIAGNOSIS — C259 Malignant neoplasm of pancreas, unspecified: Secondary | ICD-10-CM

## 2021-03-19 HISTORY — PX: BILIARY STENT PLACEMENT: SHX5538

## 2021-03-19 HISTORY — PX: SPHINCTEROTOMY: SHX5279

## 2021-03-19 HISTORY — PX: UPPER ESOPHAGEAL ENDOSCOPIC ULTRASOUND (EUS): SHX6562

## 2021-03-19 HISTORY — PX: SUBMUCOSAL LIFTING INJECTION: SHX6855

## 2021-03-19 HISTORY — PX: ESOPHAGOGASTRODUODENOSCOPY (EGD) WITH PROPOFOL: SHX5813

## 2021-03-19 HISTORY — PX: ENDOSCOPIC RETROGRADE CHOLANGIOPANCREATOGRAPHY (ERCP) WITH PROPOFOL: SHX5810

## 2021-03-19 HISTORY — PX: ENDOSCOPIC MUCOSAL RESECTION: SHX6839

## 2021-03-19 HISTORY — PX: PANCREATIC STENT PLACEMENT: SHX5539

## 2021-03-19 SURGERY — ESOPHAGOGASTRODUODENOSCOPY (EGD) WITH PROPOFOL
Anesthesia: General

## 2021-03-19 MED ORDER — SUGAMMADEX SODIUM 200 MG/2ML IV SOLN
INTRAVENOUS | Status: DC | PRN
  Administered 2021-03-19: 200 mg via INTRAVENOUS

## 2021-03-19 MED ORDER — APIXABAN 5 MG PO TABS: 5.0000 mg | ORAL_TABLET | Freq: Two times a day (BID) | ORAL | 1 refills | Status: DC

## 2021-03-19 MED ORDER — INDOMETHACIN 50 MG RE SUPP
RECTAL | Status: AC
  Filled 2021-03-19: qty 2

## 2021-03-19 MED ORDER — LIDOCAINE 2% (20 MG/ML) 5 ML SYRINGE
INTRAMUSCULAR | Status: DC | PRN
  Administered 2021-03-19: 100 mg via INTRAVENOUS

## 2021-03-19 MED ORDER — ONDANSETRON HCL 4 MG/2ML IJ SOLN
INTRAMUSCULAR | Status: DC | PRN
  Administered 2021-03-19: 4 mg via INTRAVENOUS

## 2021-03-19 MED ORDER — DEXAMETHASONE SODIUM PHOSPHATE 10 MG/ML IJ SOLN
INTRAMUSCULAR | Status: DC | PRN
  Administered 2021-03-19: 10 mg via INTRAVENOUS

## 2021-03-19 MED ORDER — FENTANYL CITRATE (PF) 100 MCG/2ML IJ SOLN
INTRAMUSCULAR | Status: AC
  Filled 2021-03-19: qty 2

## 2021-03-19 MED ORDER — SODIUM CHLORIDE 0.9 % IV SOLN: INTRAVENOUS | Status: DC

## 2021-03-19 MED ORDER — GLUCAGON HCL RDNA (DIAGNOSTIC) 1 MG IJ SOLR
INTRAMUSCULAR | Status: DC | PRN
  Administered 2021-03-19 (×4): .25 mg via INTRAVENOUS

## 2021-03-19 MED ORDER — CIPROFLOXACIN IN D5W 400 MG/200ML IV SOLN
INTRAVENOUS | Status: DC | PRN
  Administered 2021-03-19: 400 mg via INTRAVENOUS

## 2021-03-19 MED ORDER — CIPROFLOXACIN IN D5W 400 MG/200ML IV SOLN
INTRAVENOUS | Status: AC
  Filled 2021-03-19: qty 200

## 2021-03-19 MED ORDER — INDOMETHACIN 50 MG RE SUPP
RECTAL | Status: DC | PRN
  Administered 2021-03-19: 100 mg via RECTAL

## 2021-03-19 MED ORDER — PROPOFOL 10 MG/ML IV BOLUS
INTRAVENOUS | Status: DC | PRN
Start: 2021-03-19 — End: 2021-03-19
  Administered 2021-03-19: 180 mg via INTRAVENOUS

## 2021-03-19 MED ORDER — ROCURONIUM BROMIDE 10 MG/ML (PF) SYRINGE
PREFILLED_SYRINGE | INTRAVENOUS | Status: DC | PRN
  Administered 2021-03-19: 80 mg via INTRAVENOUS

## 2021-03-19 MED ORDER — PROPOFOL 10 MG/ML IV BOLUS
INTRAVENOUS | Status: AC
  Filled 2021-03-19: qty 20

## 2021-03-19 MED ORDER — FENTANYL CITRATE (PF) 250 MCG/5ML IJ SOLN
INTRAMUSCULAR | Status: DC | PRN
  Administered 2021-03-19 (×2): 25 ug via INTRAVENOUS
  Administered 2021-03-19: 50 ug via INTRAVENOUS

## 2021-03-19 MED ORDER — GLUCAGON HCL RDNA (DIAGNOSTIC) 1 MG IJ SOLR
INTRAMUSCULAR | Status: AC
  Filled 2021-03-19: qty 2

## 2021-03-19 MED ORDER — SODIUM CHLORIDE 0.9 % IV SOLN
INTRAVENOUS | Status: DC | PRN
  Administered 2021-03-19: 10 mL

## 2021-03-19 MED ORDER — PHENYLEPHRINE 40 MCG/ML (10ML) SYRINGE FOR IV PUSH (FOR BLOOD PRESSURE SUPPORT)
PREFILLED_SYRINGE | INTRAVENOUS | Status: DC | PRN
  Administered 2021-03-19 (×4): 80 ug via INTRAVENOUS

## 2021-03-19 MED ORDER — SUCRALFATE 1 G PO TABS: 1.0000 g | ORAL_TABLET | Freq: Two times a day (BID) | ORAL | 0 refills | Status: DC

## 2021-03-19 MED ORDER — EPINEPHRINE 1 MG/10ML IJ SOSY
PREFILLED_SYRINGE | INTRAMUSCULAR | Status: AC
  Filled 2021-03-19: qty 10

## 2021-03-19 MED ORDER — PANTOPRAZOLE SODIUM 40 MG PO TBEC: 40.0000 mg | DELAYED_RELEASE_TABLET | Freq: Two times a day (BID) | ORAL | 6 refills | Status: DC

## 2021-03-19 MED ORDER — LACTATED RINGERS IV SOLN
INTRAVENOUS | Status: AC | PRN
  Administered 2021-03-19: 1000 mL via INTRAVENOUS

## 2021-03-19 NOTE — Telephone Encounter (Signed)
-----   Message from Irving Copas., MD sent at 03/19/2021 12:40 PM EDT ----- [name], Please place a KUB order for 2 weeks to follow-up the pancreas stent and ensure that is fallen out. Please place a recall ERCP with me in 4 to 6 months. DJ I will update you once that the pathology comes back. Thanks. GM

## 2021-03-19 NOTE — Op Note (Addendum)
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 Estimated Blood Loss:     Estimated blood loss was minimal. Procedure:                Pre-Anesthesia Assessment:                           - Prior to the procedure, a History and Physical                            was performed, and patient medications and                            allergies were reviewed. The patient's tolerance of                            previous anesthesia was also reviewed. The risks                            and benefits of the procedure and the sedation                            options and risks were discussed with the patient.                            All questions were answered, and informed consent                            was obtained. Prior Anticoagulants: The patient has                            taken Eliquis (apixaban), last dose was 2 days  prior to procedure. ASA Grade Assessment: III - A                            patient with severe systemic disease. After                            reviewing the risks and benefits, the patient was                            deemed in satisfactory condition to undergo the                            procedure.                            After obtaining informed consent, the scope was                            passed under direct vision. Throughout the                            procedure, the patient's blood pressure, pulse, and                            oxygen saturations were monitored continuously. The                            TJF-Q180V ([id number] Olympus duodenoscope was                            introduced through the mouth, and used to inject                            contrast into and used to inject contrast into the                            bile duct and ventral pancreatic duct. The ERCP was                            technically difficult and complex. Successful                            completion of the procedure was aided by performing                            the maneuvers documented (below) in this report.                            The patient tolerated the procedure. Scope In: Scope Out: Findings:      The scout film was normal.      The esophagus was successfully intubated under direct vision without       detailed examination of the pharynx, larynx, and associated structures,       and upper GI tract. As  had been noted on the recent EUS, the ampulla had       evidence of adenomatous change significantly different from previous       2019 EGD and consistent with most recent 2022 EGD.      A short 0.035 inch Soft Jagwire was passed into the biliary tree. The       Hydratome sphincterotome was passed over the guidewire and the bile duct       was then deeply cannulated. Contrast was injected. I personally       interpreted the bile duct images. Ductal flow of contrast was adequate.       Image quality was adequate. Contrast extended to the bifurcation.       Opacification of the entire biliary tree was successful. The maximum       diameter of the ducts was 5 mm.      A short 0.035 inch Soft Jagwire was passed into the ventral pancreatic       duct. The ventral pancreatic duct was then deeply  cannulated with the       Hydratome sphincterotome. Contrast was injected. Contrast was injected.       I personally interpreted the pancreatic duct images. Ductal flow of       contrast was adequate. Image quality was adequate. Contrast extended to       the pancreatic duct. Opacification of the entire pancreatic ductal       system was successful. The maximum diameter of the ducts was 3 mm.      A 3 mm biliary sphincterotomy was made with a monofilament Hydratome       sphincterotome using ERBE electrocautery. There was no       post-sphincterotomy bleeding.      A 3 mm ventral pancreatic sphincterotomy was made with a monofilament       Hydratome sphincterotome using ERBE electrocautery. There was no       post-sphincterotomy bleeding.      The ampulla and surrounding region was successfully injected with 5 mL       Orise gel through the ERCP scope for lesion assessment, and this       injection appeared to lift the ampulla adequately. Using a snare, the       major papilla was grasped and held for 1 minute. The papilla was then       resected using ERBE electrocautery. Resection and retrieval were       complete.      A short 0.035 inch Soft Jagwire was passed into the ventral pancreatic       duct. One 4 Fr by 5 cm temporary plastic pancreatic stent with a single       external pigtail was placed into the ventral pancreatic duct. The stent       was in good position.      A short 0.035 inch Soft Jagwire was passed into the biliary tree. One       8.5 Fr by 5 cm plastic biliary stent with a single external flap and a       single internal flap was placed into the common bile duct. Bile flowed       through the stent. The stent was in good position.      The duodenoscope was withdrawn from the patient. Impression:               - Ampullary lesion noted as on EUS.                           -  A biliary sphincterotomy was performed.                           - A pancreatic sphincterotomy  was performed.                           - The major papilla was successfully injected.                            Snare endoscopic mucosal papillectomy of the major                            papilla was performed. Resection and retrieval were                            complete.                           - One temporary plastic pancreatic stent was placed                            into the ventral pancreatic duct.                           - One plastic biliary stent was placed into the                            common bile duct. Moderate Sedation:      Not Applicable - Patient had care per Anesthesia. Recommendation:           - The patient will be observed post-procedure,                            until all discharge criteria are met.                           - Patient has a contact number available for                            emergencies. The signs and symptoms of potential                            delayed complications were discussed with the                            patient. Return to normal activities tomorrow.                            Written discharge instructions were provided to the                            patient.                           - Discharge patient to home.                           -  Low fat diet for 2 weeks.                           - Observe patient's clinical course.                           - Watch for pancreatitis, bleeding, perforation,                            and cholangitis.                           - Increase to Protonix 40 mg twice daily for 1                            month.                           - Start Carafate BID x 1 month.                           - Await path results.                           - May restart Eliquis on 11/3 AM (full 72 hours) to                            decrease risk of post-interventional bleeding.                           - Patient will need a KUB 2-view in 10-14 days to                             ensure pancreatic stent has migrated successfully.                            If still present at that time will need to be                            scheduled for EGD with stent pull.                           - Repeat ERCP in 4-6 months to remove stent.                           - The findings and recommendations were discussed                            with the patient.                           - The findings and recommendations were discussed                            with the patient's family. Procedure Code(s):        ---  Professional ---                           [phone], Endoscopic retrograde                            cholangiopancreatography (ERCP); with placement of                            endoscopic stent into biliary or pancreatic duct,                            including pre- and post-dilation and guide wire                            passage, when performed, including sphincterotomy,                            when performed, each stent                           [id number], 69, Endoscopic retrograde                            cholangiopancreatography (ERCP); with placement of                            endoscopic stent into biliary or pancreatic duct,                            including pre- and post-dilation and guide wire                            passage, when performed, including sphincterotomy,                            when performed, each stent                           [id number], Esophagogastroduodenoscopy, flexible,                            transoral; with endoscopic mucosal resection                           [phone], Unlisted procedure, biliary tract Diagnosis Code(s):        --- Professional ---                           K31.7, Polyp of stomach and duodenum CPT copyright 2019 [employer]. All rights reserved. The codes documented in this report are preliminary and upon coder review may  be revised to meet current compliance  requirements. Justice Britain, MD 03/19/2021 10:49:20 AM Number of Addenda: 0

## 2021-03-19 NOTE — Transfer of Care (Signed)
Immediate Anesthesia Transfer of Care Note  Patient: [name]  Procedure(s) Performed: UPPER ESOPHAGEAL ENDOSCOPIC ULTRASOUND (EUS) ENDOSCOPIC RETROGRADE CHOLANGIOPANCREATOGRAPHY (ERCP) WITH PROPOFOL ESOPHAGOGASTRODUODENOSCOPY (EGD) WITH PROPOFOL ENDOSCOPIC MUCOSAL RESECTION, ampullectomy SUBMUCOSAL LIFTING INJECTION SPHINCTEROTOMY BILIARY STENT PLACEMENT PANCREATIC STENT PLACEMENT  Patient Location: PACU  Anesthesia Type:General  Level of Consciousness: awake, alert  and oriented  Airway & Oxygen Therapy: Patient Spontanous Breathing and Patient connected to face mask oxygen  Post-op Assessment: Report given to RN and Post -op Vital signs reviewed and stable  Post vital signs: Reviewed and stable  Last Vitals:  Vitals Value Taken Time  BP 146/54 03/19/21 1031  Temp    Pulse 87 03/19/21 1032  Resp 21 03/19/21 1032  SpO2 100 % 03/19/21 1032  Vitals shown include unvalidated device data.  Last Pain:  Vitals:   03/19/21 1031  TempSrc:   PainSc: 0-No pain         Complications: No notable events documented.

## 2021-03-19 NOTE — H&P (Signed)
GASTROENTEROLOGY PROCEDURE H&P NOTE   Primary Care Physician: Biagio Borg, MD  HPI: [name] is a 75 y.o. 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  Past Medical History:  Diagnosis Date   AICD (automatic cardioverter/defibrillator) present    Allergic rhinitis    Allergy    Anginal pain ([place]) 05/26/14   chest pain after chasing dog   Atypical nevus of back 04/27/2003   moderate - mid lower back   Basal cell carcinoma 01/26/2008   right cheek - MOHs   CAD (coronary artery disease)    hx of stent- 2005 RCA   Cataract    beginning stage both eyes   CHF (congestive heart failure) (HCC)    pacemaker Medtronic     Chronic back pain    intermittent   Constipation    Dizziness    Dysrhythmia    right bundle branch block    Esophageal stricture    GERD (gastroesophageal reflux disease)    Hemoptysis    Hiatal hernia    History of colonic polyps    hyperplastic   HTN (hypertension)    Hyperlipidemia    Hypertrophy of prostate with urinary obstruction and other lower urinary tract symptoms (LUTS)    OA (osteoarthritis)    OSA (obstructive sleep apnea)    cpap- 10    Other specified disorder of stomach and duodenum    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 Ardis Hughs 5/10   Pericarditis 07/06/2019   Pneumonia    Pre-diabetes    no medications   Shortness of breath    with exertion   Sleep apnea    cpap   Testicular hypofunction    Past Surgical History:  Procedure Laterality Date   BIV ICD INSERTION CRT-D N/A 03/27/2017   Procedure: BIV ICD INSERTION CRT-D;  Surgeon: Constance Haw, MD;  Location: [place];  Service: Cardiovascular;  Laterality: N/A;   CARDIAC CATHETERIZATION     '05, last 2009, showing patent RCA stent   COLONOSCOPY  12/2007   HYPERPLASTIC POLYP   COLONOSCOPY  12/2019   COLONOSCOPY WITH PROPOFOL N/A  06/02/2014   Procedure: COLONOSCOPY WITH PROPOFOL;  Surgeon: [name], MD;  Location: [facility];  Service: Endoscopy;  Laterality: N/A;   CORONARY ANGIOPLASTY  08/2003   ESOPHAGOGASTRODUODENOSCOPY (EGD) WITH PROPOFOL N/A 06/02/2014   Procedure: ESOPHAGOGASTRODUODENOSCOPY (EGD) WITH PROPOFOL;  Surgeon: [name], MD;  Location: [facility];  Service: Endoscopy;  Laterality: N/A;   hernia surgery x 3     Bilateral Inguinal, Umbicial   INSERTION OF MESH N/A 03/20/2018   Procedure: INSERTION OF MESH;  Surgeon: Alphonsa Overall, MD;  Location: [name];  Service: General;  Laterality: N/A;   IRRIGATION AND DEBRIDEMENT ABSCESS Left 08/14/2012   Procedure: IRRIGATION AND DEBRIDEMENT LEFT INGUINAL BOIL ;  Surgeon: Ailene Rud, MD;  [facility]: [facility];  Service: Urology;  Laterality: Left;   JOINT REPLACEMENT Right 2017   KNEE ARTHROPLASTY     LEFT HEART CATH AND CORONARY ANGIOGRAPHY N/A 01/24/2019   Procedure: LEFT HEART CATH AND CORONARY ANGIOGRAPHY;  Surgeon: Martinique, Peter M, MD;  Location: [place];  Service: Cardiovascular;  Laterality: N/A;   LEFT HEART CATHETERIZATION WITH CORONARY ANGIOGRAM N/A 05/26/2014   Procedure: LEFT HEART CATHETERIZATION WITH CORONARY ANGIOGRAM;  Surgeon: Blane Ohara, MD;  Location: [facility];  Service: Cardiovascular;  Laterality: N/A;   LUMBAR LAMINECTOMY/DECOMPRESSION MICRODISCECTOMY Left 09/26/2014   Procedure: Lumbar Laminectomy for resection of synovial cyst  Lumbar five- sacral one left;  Surgeon: Kary Kos, MD;  Location: [place] [facility];  Service: Neurosurgery;  Laterality: Left;   MOLE REMOVAL Left 03/20/2018   Procedure: MOLE REMOVAL;  Surgeon: Alphonsa Overall, MD;  Location: [place];  Service: General;  Laterality: Left;   Oral surg to removed growth from ?sinus  10/2010   ?Dermoid removed by DrRiggs   PACEMAKER INSERTION  04/03/2017   POLYPECTOMY     RCA stenting     '05 RCA   right toe surgery  Right    Cyst    RIGHT/LEFT HEART CATH AND  CORONARY ANGIOGRAPHY N/A 01/15/2017   Procedure: RIGHT/LEFT HEART CATH AND CORONARY ANGIOGRAPHY;  Surgeon: [name], MD;  Location: [place] [facility];  Service: Cardiovascular;  Laterality: N/A;   s/p right knee arthroscopy  2005   thumb surgery Right    TOTAL KNEE ARTHROPLASTY Right 03/12/2016   Procedure: RIGHT TOTAL KNEE ARTHROPLASTY;  Surgeon: Paralee Cancel, MD;  Location: [facility];  Service: Orthopedics;  Laterality: Right;   UPPER GASTROINTESTINAL ENDOSCOPY     VENTRAL HERNIA REPAIR N/A 03/20/2018   Procedure: LAPAROSCOPIC VENTRAL INCISIONAL  HERNIA ERAS PATHWAY;  Surgeon: Alphonsa Overall, MD;  Location: [facility];  Service: General;  Laterality: N/A;   Current Facility-Administered Medications  Medication Dose Route Frequency Provider Last Rate Last Admin   0.9 %  sodium chloride infusion   Intravenous Continuous Mansouraty, Telford Nab., MD       lactated ringers infusion    Continuous PRN Mansouraty, Telford Nab., MD 120 mL/hr at 03/19/21 0725 1,000 mL at 03/19/21 0725    Current Facility-Administered Medications:    0.9 %  sodium chloride infusion, , Intravenous, Continuous, Mansouraty, Telford Nab., MD   lactated ringers infusion, , , Continuous PRN, Mansouraty, Telford Nab., MD, Last Rate: 120 mL/hr at 03/19/21 0725, 1,000 mL at 03/19/21 0725 Allergies  Allergen Reactions   Sulfonamide Derivatives Rash        Family History  Problem Relation Age of Onset   Heart disease Mother    Coronary artery disease Father    Diabetes Father    Sudden death Father        due to heart disease   Heart disease Father    COPD Brother    Arthritis Brother    Heart failure Brother    Cancer Brother        lung    Stomach cancer Paternal Grandmother    Arthritis Daughter    Hypertension Other    Colon cancer Neg Hx    Esophageal cancer Neg Hx    Colon polyps Neg Hx    Ulcerative colitis Neg Hx    Liver disease Neg Hx    Pancreatic cancer Neg Hx    Inflammatory bowel disease Neg Hx     Rectal cancer Neg Hx    Social History   Socioeconomic History   Marital status: Married    Spouse name: Not on file   Number of children: 1   Years of education: Not on file   Highest education level: Not on file  Occupational History   Occupation: Charity fundraiser    Employer: [employer]  Tobacco Use   Smoking status: Former    Types: Cigarettes    Quit date: 05/20/1990    Years since quitting: 30.8   Smokeless tobacco: Never  Tobacco comments:    previous 30 pack year history  Vaping Use   Vaping Use: Never used  Substance and Sexual Activity   Alcohol use: Yes    Comment: rare   Drug use: Never   Sexual activity: Not on file  Other Topics Concern   Not on file  Social History Narrative   Not on file   Social Determinants of Health   Financial Resource Strain: Not on file  Food Insecurity: Not on file  Transportation Needs: Not on file  Physical Activity: Not on file  Stress: Not on file  Social Connections: Not on file  Intimate Partner Violence: Not on file    Physical Exam: Today's Vitals   03/19/21 0708  BP: (!) 148/65  Pulse: 99  Resp: 19  Temp: 97.9 F (36.6 C)  TempSrc: Axillary  SpO2: 98%  Weight: 86.2 kg  Height: 5\' 7"  (1.702 m)  PainSc: 0-No pain   Body mass index is 29.76 kg/m. GEN: NAD EYE: Sclerae anicteric ENT: MMM CV: Non-tachycardic GI: Soft, NT/ND NEURO:  Alert & Oriented x 3  Lab Results: No results for input(s): WBC, HGB, HCT, PLT in the last 72 hours. BMET No results for input(s): NA, K, CL, CO2, GLUCOSE, BUN, CREATININE, CALCIUM in the last 72 hours. LFT No results for input(s): PROT, ALBUMIN, AST, ALT, ALKPHOS, BILITOT, BILIDIR, IBILI in the last 72 hours. PT/INR No results for input(s): LABPROT, INR in the last 72 hours.   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  The risks of an EUS including intestinal perforation, bleeding, infection, aspiration, and medication effects were discussed as was the possibility it may not give a definitive diagnosis if a biopsy is performed.  When a biopsy of the pancreas is done as part of the EUS, there is an additional risk of pancreatitis at the rate of about 1-2%.  It was explained that procedure related pancreatitis is typically mild, although it can be severe and even life threatening, which is why we do not perform random pancreatic biopsies and only biopsy a lesion/area we feel is concerning enough to warrant the risk.   The risks of an ERCP were discussed at length, including but not limited to the risk of perforation, bleeding, abdominal pain, post-ERCP pancreatitis (while usually mild can be severe and even life threatening).   The risks and benefits of endoscopic evaluation/treatment were discussed with the patient and/or family; these include but are not limited to the risk of perforation, infection, bleeding, missed lesions, lack of diagnosis, severe illness requiring hospitalization, as well as anesthesia and sedation related illnesses.  The patient's history has been reviewed, patient examined, no change in status, and deemed stable for procedure.  The patient and/or family is agreeable to proceed.    Justice Britain, MD [facility] Office # [phone]

## 2021-03-19 NOTE — Anesthesia Procedure Notes (Addendum)
Procedure Name: Intubation Date/Time: 03/19/2021 8:05 AM Performed by: Williford, Peggy D, CRNA Pre-anesthesia Checklist: Patient identified, Emergency Drugs available, Suction available and Patient being monitored Patient Re-evaluated:Patient Re-evaluated prior to induction Oxygen Delivery Method: Circle system utilized Preoxygenation: Pre-oxygenation with 100% oxygen Induction Type: IV induction Ventilation: Mask ventilation without difficulty Laryngoscope Size: Mac, 4, Miller, 2 and Glidescope Grade View: Grade III Tube type: Oral Tube size: 7.5 mm Number of attempts: 1 Airway Equipment and Method: Stylet Placement Confirmation: ETT inserted through vocal cords under direct vision, positive ETCO2 and breath sounds checked- equal and bilateral Secured at: 22 cm Tube secured with: Tape Dental Injury: Teeth and Oropharynx as per pre-operative assessment and Injury to lip  Difficulty Due To: Difficult Airway- due to reduced neck mobility Future Recommendations: Recommend- induction with short-acting agent, and alternative techniques readily available Comments: Able to see epiglottis, but not vocal cords.  I recommend using videolaryngoscope for future intubations.

## 2021-03-19 NOTE — Telephone Encounter (Signed)
Recall entered for ERCP KUB order entered  Message sent to the pt with the information for the KUB.

## 2021-03-19 NOTE — Anesthesia Postprocedure Evaluation (Signed)
Anesthesia Post Note  Patient: [name]  Procedure(s) Performed: UPPER ESOPHAGEAL ENDOSCOPIC ULTRASOUND (EUS) ENDOSCOPIC RETROGRADE CHOLANGIOPANCREATOGRAPHY (ERCP) WITH PROPOFOL ESOPHAGOGASTRODUODENOSCOPY (EGD) WITH PROPOFOL ENDOSCOPIC MUCOSAL RESECTION, ampullectomy SUBMUCOSAL LIFTING INJECTION SPHINCTEROTOMY BILIARY STENT PLACEMENT PANCREATIC STENT PLACEMENT     Patient location during evaluation: Endoscopy Anesthesia Type: General Level of consciousness: sedated Pain management: pain level controlled Vital Signs Assessment: post-procedure vital signs reviewed and stable Respiratory status: spontaneous breathing and respiratory function stable Cardiovascular status: stable Postop Assessment: no apparent nausea or vomiting Anesthetic complications: yes   Encounter Notable Events  Notable Event Outcome Phase Comment  Difficult to intubate - unexpected Resolved in Lab Intraprocedure DIfficulty with intubation: unable to see cords with miller or mac blade: glidescope used with good visualization.    Last Vitals:  Vitals:   03/19/21 1050 03/19/21 1100  BP: (!) 157/58 (!) 127/97  Pulse: 80 81  Resp: 16 17  Temp:    SpO2: 95% 94%    Last Pain:  Vitals:   03/19/21 1100  TempSrc:   PainSc: 0-No pain                 Achaia Garlock DANIEL

## 2021-03-19 NOTE — Op Note (Signed)
[facility] Patient Name: [name] Procedure Date: 03/19/2021 MRN: [id number] Attending MD: Justice Britain , MD Date of Birth: [birth year] CSN: [id number] Age: 75 Admit Type: Outpatient Procedure:                Upper EUS Indications:              Mucosal mass/polyp involving the major papilla                            (found on endoscopy), Polyps in the duodenum,                            Follow-up of polyps in the duodenum Providers:                Justice Britain, MD, Kary Kos RN, RN, Tyna Jaksch Technician Referring MD:             [name], MD, Biagio Borg, MD Medicines:                General Anesthesia Complications:            No immediate complications. Estimated Blood Loss:     Estimated blood loss was minimal. Procedure:                Pre-Anesthesia Assessment:                           - Prior to the procedure, a History and Physical                            was performed, and patient medications and                            allergies were reviewed. The patient's tolerance of                            previous anesthesia was also reviewed. The risks                            and benefits of the procedure and the sedation                            options and risks were discussed with the patient.                            All questions were answered, and informed consent                            was obtained. Prior Anticoagulants: The patient has                            taken Eliquis (apixaban), last dose was 2 days  prior to procedure. ASA Grade Assessment: III - A                            patient with severe systemic disease. After                            reviewing the risks and benefits, the patient was                            deemed in satisfactory condition to undergo the                            procedure.                           After  obtaining informed consent, the endoscope was                            passed under direct vision. Throughout the                            procedure, the patient's blood pressure, pulse, and                            oxygen saturations were monitored continuously. The                            [id number]-H190 ([id number]) Olympus endoscope was introduced                            through the mouth, and advanced to the second part                            of duodenum. The [id number]-[id number] ([id number]) Olympus                            duodenoscope was introduced through the mouth, and                            advanced to the area of papilla. The [id number]-AL5                            ([id number]) Olympus ultrasound scope was introduced                            through the mouth, and advanced to the duodenum for                            ultrasound examination. The [id number] ([id number])                            Olympus linear ultrasound scope was introduced  through the mouth, and advanced to the duodenum for                            ultrasound examination from the stomach and                            duodenum. The upper EUS was accomplished without                            difficulty. The patient tolerated the procedure. Findings:      ENDOSCOPIC FINDING: :      No gross lesions were noted in the entire esophagus.      The Z-line was irregular and was found 40 cm from the incisors.      A 2 cm hiatal hernia was present.      Innumberable 2 to 10 mm pedunculated and sessile polyps with no bleeding       and no stigmata of recent bleeding were found in the cardia, in the       gastric fundus and in the gastric body - previously seen and noted in       the past.      No other gross lesions were noted in the entire examined stomach.      A single 15 mm semi-sessile polypoid lesion with no bleeding was found       at the major papilla (using NBI imaging this was  suggestive of JNET2a       adenomatous tissue).      No other gross lesions were noted in the entire examined duodenum of the       D1/D2/D3 region.      ENDOSONOGRAPHIC FINDING: :      A hypoechoic pedunculated lesion was identified endosonographically at       the ampulla. The lesion measured 10 mm by 8 mm in maximal       cross-sectional diameter. The lesion extended from the mucosa to the       muscularis mucosa. The outer margins were smooth.      A hypoechoic lesion suggestive of a cyst was identified in the genu/body       transition of the pancreas. It is not in obvious communication with the       pancreatic duct. The lesion measured 6 mm by 4 mm in maximal       cross-sectional diameter. There was a single compartment without septae.       The outer wall of the lesion was thin. There was no associated mass.       There was no internal debris within the fluid-filled cavity.      There was no other sign of significant endosonographic abnormality in       the pancreatic head (PD = 0.5 mm), genu of the pancreas (PD = 2.2 mm) ,       pancreatic body (PD = 1.2 mm) and pancreatic tail (PD = 1.0 mm). No       masses, no calcifications, the pancreatic duct was thin in caliber.      There was no sign of significant endosonographic abnormality in the       common bile duct (1.5 mm -> 3.7 mm). An unremarkable gallbladder was       identified.      Endosonographic imaging  in the visualized portion of the liver showed no       mass.      No malignant-appearing lymph nodes were visualized in the celiac region       (level 20), peripancreatic region and porta hepatis region.      The celiac region was visualized. Impression:               EGD Impression:                           - No gross lesions in esophagus. Z-line irregular,                            40 cm from the incisors.                           - 2 cm hiatal hernia.                           - Innumberable gastric polyps - fundic  gland in                            appearance. No other gross lesions in the stomach.                           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                           - No other gross lesions in the entire visualized                            duodenum.                           EUS Impression:                           - A lesion was found at the ampulla. Tissue has not                            been obtained. However, the clinical appearance is                            highly suspicious for an adenoma.                           - A cystic lesion was seen in the genu/body                            transition of the pancreas. Tissue/fluid has not  been obtained. 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                           - There was no other sign of significant pathology                            in the pancreatic head, genu of the pancreas,                            pancreatic body and pancreatic tail.                           - There was no sign of significant pathology in the                            common bile duct.                           - No malignant-appearing lymph nodes were                            visualized in the celiac region (level 20),                            peripancreatic region and porta hepatis region. Moderate Sedation:      Not Applicable - Patient had care per Anesthesia. Recommendation:           - Proceed to scheduled ERCP for attempt at                            endoscopic ampullectomy.                           - Observe patient's clinical course.                           - The findings and recommendations were discussed                             with the patient.                           - The findings and recommendations were discussed                            with the patient's family. Procedure Code(s):        --- Professional ---                           [phone], Esophagogastroduodenoscopy, flexible,  transoral; with endoscopic ultrasound examination                            limited to the esophagus, stomach or duodenum, and                            adjacent structures Diagnosis Code(s):        --- Professional ---                           K22.8, Other specified diseases of esophagus                           K44.9, Diaphragmatic hernia without obstruction or                            gangrene                           K31.7, Polyp of stomach and duodenum                           K86.2, Cyst of pancreas                           I89.9, Noninfective disorder of lymphatic vessels                            and lymph nodes, unspecified                           K83.9, Disease of biliary tract, unspecified                           K83.8, Other specified diseases of biliary tract CPT copyright 2019 American Medical Association. All rights reserved. The codes documented in this report are preliminary and upon coder review may  be revised to meet current compliance requirements. Justice Britain, MD 03/19/2021 10:35:05 AM Number of Addenda: 0

## 2021-03-20 ENCOUNTER — Encounter (HOSPITAL_COMMUNITY): Payer: Self-pay | Admitting: Gastroenterology

## 2021-03-20 ENCOUNTER — Encounter: Payer: Self-pay | Admitting: Gastroenterology

## 2021-03-20 LAB — SURGICAL PATHOLOGY

## 2021-03-21 NOTE — Telephone Encounter (Signed)
Patient called states he is experiencing back stool and a slight fever seeking advise.

## 2021-03-21 NOTE — Telephone Encounter (Signed)
Pt had ERCP/EUS on 03/19/21 and has been doing very well. Today he had a BM at lunch and the stool was "jet black".  His temp is 99 F.  He has complaints of lots of gas and bloating with tenderness on the right side of the abdomen above the belly button.  He also complains of a "sour stomach"  I will send to Dr Rush Landmark for review.  The pt was also advised if his pain increases, his temp increases, or he has more black stools or bleeding he will go to the ED.  He will wait further instruction from Dr Rush Landmark

## 2021-03-21 NOTE — Addendum Note (Signed)
Addended by: Justice Britain on: 03/21/2021 05:07 PM   Modules accepted: Orders

## 2021-03-21 NOTE — Telephone Encounter (Signed)
I have called and spoken with the patient this afternoon. He describes 1 episode of solid formed brown/black stool. He normally has a bowel movement daily. Yesterday he had a bowel movement that was normal brown. He has had some sour taste to his stomach over the course of today but he is feeling better at this time. He has reported subjective low-grade fever (no higher than 99). I discussed with the patient about the possibility that he could have sphincterotomy/ampullectomy bleeding. If this were to be the case I would expect him to have more significant overt melena but this could be the harbinger of things. As such, even though he is feeling well we need to be mindful and thoughtful of this. I have asked the patient to consider emergency department evaluation versus very close observation into tomorrow. He is elected to monitor symptoms and update the on-call GI provider if symptoms progress or worsen overnight. He is aware that if progressive abdominal pain develops and/your dark stools become more frequent that he needs to call the on-call provider understanding that that person will likely tell him to go to the hospital. If the patient is stable and not having issues he will come in tomorrow around 8 in the morning to have laboratories. I have ordered a CBC/CMP/amylase/lipase to be drawn tomorrow morning for stat labs. If the patient has developed a progressive anemia or is having persistent dark stools we will then move forward with him coming into the hospital. If things are stable and he is doing well then we will just hold his Eliquis for at least 1 more day before restarting. Patient will maintain twice daily PPI as well as Carafate as outlined in his procedure note.  The patient agrees with this plan of action and understands the need of coming in sooner if things change.  I will forward this to the on-call GI provider for [place] this evening.  [name], please call the patient  tomorrow morning to check and see how he is doing if he has not called him before.  Justice Britain, MD [facility] Office # [phone]

## 2021-03-22 ENCOUNTER — Other Ambulatory Visit (INDEPENDENT_AMBULATORY_CARE_PROVIDER_SITE_OTHER): Payer: Medicare Other

## 2021-03-22 ENCOUNTER — Inpatient Hospital Stay (HOSPITAL_COMMUNITY)
Admission: EM | Admit: 2021-03-22 | Discharge: 2021-03-24 | DRG: 378 | Disposition: A | Discharge status: Home / Self Care | Payer: Medicare Other | Attending: Internal Medicine | Admitting: Internal Medicine

## 2021-03-22 ENCOUNTER — Emergency Department (HOSPITAL_COMMUNITY): Payer: Medicare Other

## 2021-03-22 ENCOUNTER — Encounter (HOSPITAL_COMMUNITY): Payer: Self-pay | Admitting: Emergency Medicine

## 2021-03-22 DIAGNOSIS — Z833 Family history of diabetes mellitus: Secondary | ICD-10-CM

## 2021-03-22 DIAGNOSIS — K264 Chronic or unspecified duodenal ulcer with hemorrhage: Secondary | ICD-10-CM | POA: Diagnosis not present

## 2021-03-22 DIAGNOSIS — I10 Essential (primary) hypertension: Secondary | ICD-10-CM | POA: Diagnosis not present

## 2021-03-22 DIAGNOSIS — I2510 Atherosclerotic heart disease of native coronary artery without angina pectoris: Secondary | ICD-10-CM | POA: Diagnosis not present

## 2021-03-22 DIAGNOSIS — D135 Benign neoplasm of extrahepatic bile ducts: Secondary | ICD-10-CM

## 2021-03-22 DIAGNOSIS — E782 Mixed hyperlipidemia: Secondary | ICD-10-CM

## 2021-03-22 DIAGNOSIS — D62 Acute posthemorrhagic anemia: Secondary | ICD-10-CM | POA: Diagnosis present

## 2021-03-22 DIAGNOSIS — N138 Other obstructive and reflux uropathy: Secondary | ICD-10-CM | POA: Diagnosis present

## 2021-03-22 DIAGNOSIS — K317 Polyp of stomach and duodenum: Secondary | ICD-10-CM | POA: Diagnosis present

## 2021-03-22 DIAGNOSIS — R110 Nausea: Secondary | ICD-10-CM | POA: Diagnosis not present

## 2021-03-22 DIAGNOSIS — Z79899 Other long term (current) drug therapy: Secondary | ICD-10-CM

## 2021-03-22 DIAGNOSIS — Z7901 Long term (current) use of anticoagulants: Secondary | ICD-10-CM

## 2021-03-22 DIAGNOSIS — K219 Gastro-esophageal reflux disease without esophagitis: Secondary | ICD-10-CM | POA: Diagnosis present

## 2021-03-22 DIAGNOSIS — Z20822 Contact with and (suspected) exposure to covid-19: Secondary | ICD-10-CM | POA: Diagnosis not present

## 2021-03-22 DIAGNOSIS — N401 Enlarged prostate with lower urinary tract symptoms: Secondary | ICD-10-CM | POA: Diagnosis not present

## 2021-03-22 DIAGNOSIS — I480 Paroxysmal atrial fibrillation: Secondary | ICD-10-CM | POA: Diagnosis present

## 2021-03-22 DIAGNOSIS — I110 Hypertensive heart disease with heart failure: Secondary | ICD-10-CM | POA: Diagnosis not present

## 2021-03-22 DIAGNOSIS — Z9989 Dependence on other enabling machines and devices: Secondary | ICD-10-CM

## 2021-03-22 DIAGNOSIS — K922 Gastrointestinal hemorrhage, unspecified: Secondary | ICD-10-CM | POA: Diagnosis not present

## 2021-03-22 DIAGNOSIS — Z95810 Presence of automatic (implantable) cardiac defibrillator: Secondary | ICD-10-CM

## 2021-03-22 DIAGNOSIS — G4733 Obstructive sleep apnea (adult) (pediatric): Secondary | ICD-10-CM

## 2021-03-22 DIAGNOSIS — I5022 Chronic systolic (congestive) heart failure: Secondary | ICD-10-CM | POA: Diagnosis not present

## 2021-03-22 DIAGNOSIS — K921 Melena: Secondary | ICD-10-CM | POA: Diagnosis not present

## 2021-03-22 DIAGNOSIS — R195 Other fecal abnormalities: Secondary | ICD-10-CM

## 2021-03-22 DIAGNOSIS — K222 Esophageal obstruction: Secondary | ICD-10-CM | POA: Diagnosis present

## 2021-03-22 DIAGNOSIS — Z8249 Family history of ischemic heart disease and other diseases of the circulatory system: Secondary | ICD-10-CM

## 2021-03-22 DIAGNOSIS — Z66 Do not resuscitate: Secondary | ICD-10-CM | POA: Diagnosis present

## 2021-03-22 DIAGNOSIS — M069 Rheumatoid arthritis, unspecified: Secondary | ICD-10-CM | POA: Diagnosis present

## 2021-03-22 DIAGNOSIS — Z955 Presence of coronary angioplasty implant and graft: Secondary | ICD-10-CM

## 2021-03-22 DIAGNOSIS — I255 Ischemic cardiomyopathy: Secondary | ICD-10-CM | POA: Diagnosis present

## 2021-03-22 DIAGNOSIS — K449 Diaphragmatic hernia without obstruction or gangrene: Secondary | ICD-10-CM | POA: Diagnosis present

## 2021-03-22 DIAGNOSIS — E785 Hyperlipidemia, unspecified: Secondary | ICD-10-CM | POA: Diagnosis present

## 2021-03-22 DIAGNOSIS — R109 Unspecified abdominal pain: Secondary | ICD-10-CM | POA: Diagnosis not present

## 2021-03-22 DIAGNOSIS — Z85828 Personal history of other malignant neoplasm of skin: Secondary | ICD-10-CM

## 2021-03-22 DIAGNOSIS — Z87891 Personal history of nicotine dependence: Secondary | ICD-10-CM

## 2021-03-22 DIAGNOSIS — Z800 Family history of malignant neoplasm of digestive organs: Secondary | ICD-10-CM

## 2021-03-22 DIAGNOSIS — R9431 Abnormal electrocardiogram [ECG] [EKG]: Secondary | ICD-10-CM | POA: Diagnosis not present

## 2021-03-22 DIAGNOSIS — Z96651 Presence of right artificial knee joint: Secondary | ICD-10-CM | POA: Diagnosis present

## 2021-03-22 LAB — CBC WITH DIFFERENTIAL/PLATELET
Abs Immature Granulocytes: 0.05 10*3/uL (ref 0.00–0.07)
Basophils Absolute: 0.1 10*3/uL (ref 0.0–0.1)
Basophils Relative: 1 %
Eosinophils Absolute: 0.2 10*3/uL (ref 0.0–0.5)
Eosinophils Relative: 2 %
HCT: 41.1 % (ref 39.0–52.0)
Hemoglobin: 13.6 g/dL (ref 13.0–17.0)
Immature Granulocytes: 1 %
Lymphocytes Relative: 25 %
Lymphs Abs: 2.3 10*3/uL (ref 0.7–4.0)
MCH: 30 pg (ref 26.0–34.0)
MCHC: 33.1 g/dL (ref 30.0–36.0)
MCV: 90.5 fL (ref 80.0–100.0)
Monocytes Absolute: 0.9 10*3/uL (ref 0.1–1.0)
Monocytes Relative: 9 %
Neutro Abs: 5.7 10*3/uL (ref 1.7–7.7)
Neutrophils Relative %: 62 %
Platelets: 291 10*3/uL (ref 150–400)
RBC: 4.54 MIL/uL (ref 4.22–5.81)
RDW: 13.2 % (ref 11.5–15.5)
WBC: 9.2 10*3/uL (ref 4.0–10.5)
nRBC: 0 % (ref 0.0–0.2)

## 2021-03-22 LAB — CBC
HCT: 39.8 % (ref 39.0–52.0)
HCT: 39.8 % (ref 39.0–52.0)
Hemoglobin: 13.2 g/dL (ref 13.0–17.0)
Hemoglobin: 13 g/dL (ref 13.0–17.0)
MCH: 29.9 pg (ref 26.0–34.0)
MCHC: 33.2 g/dL (ref 30.0–36.0)
MCHC: 32.7 g/dL (ref 30.0–36.0)
MCV: 89.1 fl (ref 78.0–100.0)
MCV: 91.5 fL (ref 80.0–100.0)
Platelets: 284 10*3/uL (ref 150.0–400.0)
Platelets: 264 10*3/uL (ref 150–400)
RBC: 4.47 Mil/uL (ref 4.22–5.81)
RBC: 4.35 MIL/uL (ref 4.22–5.81)
RDW: 13.8 % (ref 11.5–15.5)
RDW: 13.3 % (ref 11.5–15.5)
WBC: 9.5 10*3/uL (ref 4.0–10.5)
WBC: 8.6 10*3/uL (ref 4.0–10.5)
nRBC: 0 % (ref 0.0–0.2)

## 2021-03-22 LAB — COMPREHENSIVE METABOLIC PANEL
ALT: 17 U/L (ref 0–53)
ALT: 19 U/L (ref 0–44)
AST: 16 U/L (ref 0–37)
AST: 17 U/L (ref 15–41)
Albumin: 4.2 g/dL (ref 3.5–5.2)
Albumin: 3.9 g/dL (ref 3.5–5.0)
Alkaline Phosphatase: 53 U/L (ref 39–117)
Alkaline Phosphatase: 52 U/L (ref 38–126)
Anion gap: 4 — ABNORMAL LOW (ref 5–15)
BUN: 34 mg/dL — ABNORMAL HIGH (ref 6–23)
BUN: 38 mg/dL — ABNORMAL HIGH (ref 8–23)
CO2: 25 mEq/L (ref 19–32)
CO2: 25 mmol/L (ref 22–32)
Calcium: 9.3 mg/dL (ref 8.4–10.5)
Calcium: 8.9 mg/dL (ref 8.9–10.3)
Chloride: 106 mEq/L (ref 96–112)
Chloride: 107 mmol/L (ref 98–111)
Creatinine, Ser: 0.87 mg/dL (ref 0.40–1.50)
Creatinine, Ser: 0.94 mg/dL (ref 0.61–1.24)
GFR, Estimated: >60 mL/min (ref >60)
GFR: 83.5 mL/min (ref >60.00)
Glucose, Bld: 98 mg/dL (ref 70–99)
Glucose, Bld: 149 mg/dL — ABNORMAL HIGH (ref 70–99)
Potassium: 3.8 mEq/L (ref 3.5–5.1)
Potassium: 3.8 mmol/L (ref 3.5–5.1)
Sodium: 139 mEq/L (ref 135–145)
Sodium: 136 mmol/L (ref 135–145)
Total Bilirubin: 1.2 mg/dL (ref 0.2–1.2)
Total Bilirubin: 1.4 mg/dL — ABNORMAL HIGH (ref 0.3–1.2)
Total Protein: 6.6 g/dL (ref 6.0–8.3)
Total Protein: 6.3 g/dL — ABNORMAL LOW (ref 6.5–8.1)

## 2021-03-22 LAB — LIPASE: Lipase: 22 U/L (ref 11.0–59.0)

## 2021-03-22 LAB — RESP PANEL BY RT-PCR (FLU A&B, COVID) ARPGX2
Influenza A by PCR: NEGATIVE
Influenza B by PCR: NEGATIVE
SARS Coronavirus 2 by RT PCR: NEGATIVE

## 2021-03-22 LAB — POC OCCULT BLOOD, ED: Fecal Occult Bld: POSITIVE — AB

## 2021-03-22 LAB — TYPE AND SCREEN
ABO/RH(D): O POS
Antibody Screen: NEGATIVE

## 2021-03-22 LAB — AMYLASE: Amylase: 23 U/L — ABNORMAL LOW (ref 27–131)

## 2021-03-22 MED ORDER — TAMSULOSIN HCL 0.4 MG PO CAPS
0.4000 mg | ORAL_CAPSULE | Freq: Every day | ORAL | Status: DC
  Administered 2021-03-23 – 2021-03-24 (×2): 0.4 mg via ORAL
  Filled 2021-03-22 (×2): qty 1

## 2021-03-22 MED ORDER — POLYETHYLENE GLYCOL 3350 17 G PO PACK: 17.0000 g | PACK | Freq: Every day | ORAL | Status: DC | PRN

## 2021-03-22 MED ORDER — CYCLOSPORINE 0.05 % OP EMUL
1.0000 [drp] | Freq: Two times a day (BID) | OPHTHALMIC | Status: DC
  Administered 2021-03-23 – 2021-03-24 (×2): 1 [drp] via OPHTHALMIC
  Filled 2021-03-22 (×4): qty 30

## 2021-03-22 MED ORDER — ATORVASTATIN CALCIUM 20 MG PO TABS
20.0000 mg | ORAL_TABLET | Freq: Every day | ORAL | Status: DC
  Administered 2021-03-23 – 2021-03-24 (×2): 20 mg via ORAL
  Filled 2021-03-22 (×2): qty 1

## 2021-03-22 MED ORDER — IOHEXOL 350 MG/ML SOLN
100.0000 mL | Freq: Once | INTRAVENOUS | Status: AC | PRN
  Administered 2021-03-22: 100 mL via INTRAVENOUS

## 2021-03-22 MED ORDER — SACUBITRIL-VALSARTAN 49-51 MG PO TABS
1.0000 | ORAL_TABLET | Freq: Two times a day (BID) | ORAL | Status: DC
  Administered 2021-03-23 – 2021-03-24 (×3): 1 via ORAL
  Filled 2021-03-22 (×4): qty 1

## 2021-03-22 MED ORDER — SUCRALFATE 1 G PO TABS
1.0000 g | ORAL_TABLET | Freq: Three times a day (TID) | ORAL | Status: DC
Start: 2021-03-23 — End: 2021-03-24
  Administered 2021-03-23 – 2021-03-24 (×4): 1 g via ORAL
  Filled 2021-03-22 (×4): qty 1

## 2021-03-22 MED ORDER — ACETAMINOPHEN 650 MG RE SUPP: 650.0000 mg | Freq: Four times a day (QID) | RECTAL | Status: DC | PRN

## 2021-03-22 MED ORDER — PANTOPRAZOLE SODIUM 40 MG IV SOLR
40.0000 mg | Freq: Two times a day (BID) | INTRAVENOUS | Status: DC
  Administered 2021-03-23 – 2021-03-24 (×4): 40 mg via INTRAVENOUS
  Filled 2021-03-22 (×4): qty 40

## 2021-03-22 MED ORDER — SPIRONOLACTONE 12.5 MG HALF TABLET
12.5000 mg | ORAL_TABLET | Freq: Every day | ORAL | Status: DC
  Administered 2021-03-23 – 2021-03-24 (×2): 12.5 mg via ORAL
  Filled 2021-03-22 (×3): qty 1

## 2021-03-22 MED ORDER — HYDROXYCHLOROQUINE SULFATE 200 MG PO TABS
200.0000 mg | ORAL_TABLET | ORAL | Status: DC
  Administered 2021-03-23: 200 mg via ORAL
  Filled 2021-03-22 (×2): qty 1

## 2021-03-22 MED ORDER — ONDANSETRON HCL 4 MG PO TABS: 4.0000 mg | ORAL_TABLET | Freq: Four times a day (QID) | ORAL | Status: DC | PRN

## 2021-03-22 MED ORDER — ONDANSETRON HCL 4 MG/2ML IJ SOLN: 4.0000 mg | Freq: Four times a day (QID) | INTRAMUSCULAR | Status: DC | PRN

## 2021-03-22 MED ORDER — FOLIC ACID 1 MG PO TABS
500.0000 ug | ORAL_TABLET | Freq: Every day | ORAL | Status: DC
  Administered 2021-03-23 – 2021-03-24 (×2): 0.5 mg via ORAL
  Filled 2021-03-22 (×2): qty 1

## 2021-03-22 MED ORDER — SODIUM CHLORIDE 0.9 % IV BOLUS
1000.0000 mL | Freq: Once | INTRAVENOUS | Status: AC
  Administered 2021-03-22: 1000 mL via INTRAVENOUS

## 2021-03-22 MED ORDER — CARVEDILOL 25 MG PO TABS
25.0000 mg | ORAL_TABLET | Freq: Two times a day (BID) | ORAL | Status: DC
  Administered 2021-03-23 – 2021-03-24 (×3): 25 mg via ORAL
  Filled 2021-03-22 (×3): qty 1

## 2021-03-22 MED ORDER — TRAMADOL HCL 50 MG PO TABS: 50.0000 mg | ORAL_TABLET | Freq: Every evening | ORAL | Status: DC | PRN

## 2021-03-22 MED ORDER — ACETAMINOPHEN 325 MG PO TABS: 650.0000 mg | ORAL_TABLET | Freq: Four times a day (QID) | ORAL | Status: DC | PRN

## 2021-03-22 NOTE — ED Triage Notes (Addendum)
Pt states that he had a endoscopy several days ago and now has blood in his stool. Dark, black stool. Pt takes eliquis but has not had it in a week. Endorses nausea and RLQ pain. Alert and oriented.

## 2021-03-22 NOTE — Telephone Encounter (Signed)
I spoke with the pt and tells me that he had another "jet black" formed stool this morning then another loose stool sometime later.  He is nauseous and his abd is tender to touch.  He has been advised to go to the ED for eval.  He did have labs this morning that have not resulted as of this note.  FYI Dr Rush Landmark

## 2021-03-22 NOTE — ED Notes (Signed)
EDP at the bedside to evaluate.  

## 2021-03-22 NOTE — Progress Notes (Signed)
Called by Dr. Darl Householder. 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 NPO after MN, ok for clears until then Hold anticoagulation Inpatient GI team to see tomorrow morning Discussed with Dr. Fuller Plan the biliary endoscopist on call this week,

## 2021-03-22 NOTE — Telephone Encounter (Signed)
[name], I agree with this plan of action. I'm forwarding this to inpatient team so they are aware. MS and CD this is a patient I did an Ampullectomy on.  Did well after I called him the day after procedure and then received a late call yesterday about change in bowels. Looks like still having dark stools, so I think he will need an ERCP to evaluate the site. He did not restart his Eliquis as of yet. Labs from [facility] are pending STAT today. Thanks for taking care of him and please keep me UTD. I'll be away this afternoon/tomorrow. GM

## 2021-03-22 NOTE — ED Provider Notes (Signed)
[facility] DEPT Provider Note   CSN: [id number] Arrival date & time: 03/22/21  1042     History Chief Complaint  Patient presents with   Blood In [place] is a 75 y.o. male history of CAD, heart failure with AICD, A. fib on Eliquis, here presenting with melena.  Patient just had endoscopy done by Dr. Rush Landmark 4 days ago.  He states that he had multiple polyps in his stomach at that time.  He also had a stent put in his duodenum and also 1 polyp removed by the duodenum that was precancerous.  He states that he was doing well that day.  He states that 2 days ago, he started having 1 episode of black stools.  He had another episode yesterday.  This morning, he went to [place] and was sent here for further evaluation.  He is also complaining of right lower quadrant pain  The history is provided by the patient.      Past Medical History:  Diagnosis Date   AICD (automatic cardioverter/defibrillator) present    Allergic rhinitis    Allergy    Anginal pain ([place]) 05/26/14   chest pain after chasing dog   Atypical nevus of back 04/27/2003   moderate - mid lower back   Basal cell carcinoma 01/26/2008   right cheek - MOHs   CAD (coronary artery disease)    hx of stent- 2005 RCA   Cataract    beginning stage both eyes   CHF (congestive heart failure) (HCC)    pacemaker Medtronic     Chronic back pain    intermittent   Constipation    Dizziness    Dysrhythmia    right bundle branch block    Esophageal stricture    GERD (gastroesophageal reflux disease)    Hemoptysis    Hiatal hernia    History of colonic polyps    hyperplastic   HTN (hypertension)    Hyperlipidemia    Hypertrophy of prostate with urinary obstruction and other lower urinary tract symptoms (LUTS)    OA (osteoarthritis)    OSA (obstructive sleep apnea)    cpap- 10    Other specified disorder of stomach and duodenum    duodenal periampulary tubulovillous adenoma  removed by Dr. 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   Abnormal findings on esophagogastroduodenoscopy (EGD) 02/03/2021   Polyp of duodenum 02/03/2021   Axillary abscess 06/26/2020   Chronic diarrhea 06/26/2020   Fever 01/18/2020   Pericarditis 07/06/2019   Right hip pain 07/06/2019   Acute chest pain 01/24/2019   Prediabetes 12/24/2018   Rheumatoid arthritis ([place]) 12/24/2018   Preventative health care 06/25/2018   Incarcerated ventral hernia 03/20/2018   DCM (dilated cardiomyopathy) ([place]) 32/35/5732   Chronic systolic heart failure ([place]) 01/15/2017   History of pneumonia 10/23/2016   Osteoarthritis 10/23/2016   Primary osteoarthritis of both feet 10/04/2016   Primary osteoarthritis of left knee 10/04/2016   DDD (degenerative disc disease), lumbar 10/04/2016   Hemoptysis 05/27/2016   Overweight (BMI 25.0-29.9) 03/14/2016   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   Parotid mass 08/23/2015   Dyspnea 11/29/2014   Synovial cyst of lumbar  facet joint 09/26/2014   Incisional hernia, without obstruction or gangrene 12/06/2013   Nausea alone 08/27/2013   Colon cancer screening 08/27/2013   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   Benign prostatic hyperplasia with urinary obstruction 06/18/2010   Other specified disorder of stomach and duodenum 12/16/2008   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 11/15/2008   Gastroesophageal reflux disease 09/02/2008   COLONIC POLYPS, HYPERPLASTIC 05/24/2007   Obstructive sleep apnea 05/24/2007   ALLERGIC RHINITIS 05/24/2007   ESOPHAGEAL STRICTURE 05/24/2007   HIATAL HERNIA 05/24/2007   Hyperlipidemia 02/06/2007   Hypertensive disorder 02/06/2007   CAD (coronary artery disease) 02/06/2007   Primary osteoarthritis of both hands  02/06/2007   Neck pain on right side 02/06/2007    Past Surgical History:  Procedure Laterality Date   BILIARY STENT PLACEMENT N/A 03/19/2021   Procedure: BILIARY STENT PLACEMENT;  Surgeon: Irving Copas., MD;  Location: [facility];  Service: Gastroenterology;  Laterality: N/A;   BIV ICD INSERTION CRT-D N/A 03/27/2017   Procedure: BIV ICD INSERTION CRT-D;  Surgeon: Constance Haw, MD;  Location: [place] [facility];  Service: Cardiovascular;  Laterality: N/A;   CARDIAC CATHETERIZATION     '05, last 2009, showing patent RCA stent   COLONOSCOPY  12/2007   HYPERPLASTIC POLYP   COLONOSCOPY  12/2019   COLONOSCOPY WITH PROPOFOL N/A 06/02/2014   Procedure: COLONOSCOPY WITH PROPOFOL;  Surgeon: [name], MD;  Location: [facility];  Service: Endoscopy;  Laterality: N/A;   CORONARY ANGIOPLASTY  08/2003   ENDOSCOPIC MUCOSAL RESECTION  03/19/2021   Procedure: ENDOSCOPIC MUCOSAL RESECTION, ampullectomy;  Surgeon: Irving Copas., MD;  Location: [facility];  Service: Gastroenterology;;   ENDOSCOPIC RETROGRADE CHOLANGIOPANCREATOGRAPHY (ERCP) WITH PROPOFOL N/A 03/19/2021   Procedure: ENDOSCOPIC RETROGRADE CHOLANGIOPANCREATOGRAPHY (ERCP) WITH PROPOFOL;  Surgeon: Irving Copas., MD;  Location: [facility];  Service: Gastroenterology;  Laterality: N/A;   ESOPHAGOGASTRODUODENOSCOPY (EGD) WITH PROPOFOL N/A 06/02/2014   Procedure: ESOPHAGOGASTRODUODENOSCOPY (EGD) WITH PROPOFOL;  Surgeon: [name], MD;  [facility];  Service: Endoscopy;  Laterality: N/A;   ESOPHAGOGASTRODUODENOSCOPY (EGD) WITH PROPOFOL N/A 03/19/2021   Procedure: ESOPHAGOGASTRODUODENOSCOPY (EGD) WITH PROPOFOL;  Surgeon: Rush Landmark Telford Nab., MD;  [facility];  Service: Gastroenterology;  Laterality: N/A;   hernia surgery x 3     Bilateral Inguinal, Umbicial   INSERTION OF MESH N/A 03/20/2018   Procedure: INSERTION OF MESH;  Surgeon: Alphonsa Overall, MD;  Location: [facility];   Service: General;  Laterality: N/A;   IRRIGATION AND DEBRIDEMENT ABSCESS Left 08/14/2012   Procedure: IRRIGATION AND DEBRIDEMENT LEFT INGUINAL BOIL ;  Surgeon: Ailene Rud, MD;  [facility];  Service: Urology;  Laterality: Left;   JOINT REPLACEMENT Right 2017   KNEE ARTHROPLASTY     LEFT HEART CATH AND CORONARY ANGIOGRAPHY N/A 01/24/2019   Procedure: LEFT HEART CATH AND CORONARY ANGIOGRAPHY;  Surgeon: Martinique, Peter M, MD;  Location: [facility];  Service: Cardiovascular;  Laterality: N/A;   LEFT HEART CATHETERIZATION WITH CORONARY ANGIOGRAM N/A 05/26/2014   Procedure: LEFT HEART CATHETERIZATION WITH CORONARY ANGIOGRAM;  Surgeon: Blane Ohara, MD;  Location: [facility];  Service: Cardiovascular;  Laterality: N/A;   LUMBAR LAMINECTOMY/DECOMPRESSION MICRODISCECTOMY Left 09/26/2014   Procedure: Lumbar Laminectomy for resection of synovial cyst  Lumbar five- sacral one left;  Surgeon: Kary Kos, MD;  Location: [facility];  Service: Neurosurgery;  Laterality: Left;   MOLE REMOVAL Left 03/20/2018   Procedure: MOLE REMOVAL;  Surgeon: Alphonsa Overall, MD;  Location: [facility];  Service: General;  Laterality: Left;   Oral surg to removed growth from ?sinus  10/2010   ?Dermoid removed by DrRiggs   PACEMAKER INSERTION  04/03/2017   PANCREATIC STENT PLACEMENT  03/19/2021   Procedure: PANCREATIC STENT PLACEMENT;  Surgeon: Irving Copas., MD;  [facility];  Service: Gastroenterology;;   POLYPECTOMY     RCA stenting     '05 RCA   right toe surgery  Right    Cyst    RIGHT/LEFT HEART CATH AND CORONARY ANGIOGRAPHY N/A 01/15/2017   Procedure: RIGHT/LEFT HEART CATH AND CORONARY ANGIOGRAPHY;  Surgeon: [name], MD;  Location: [facility];  Service: Cardiovascular;  Laterality: N/A;   s/p right knee arthroscopy  2005   SPHINCTEROTOMY  03/19/2021   Procedure: SPHINCTEROTOMY;  Surgeon: Mansouraty, Telford Nab., MD;  Location: [facility];  Service: Gastroenterology;;    [place] INJECTION  03/19/2021   Procedure: SUBMUCOSAL LIFTING INJECTION;  Surgeon: Irving Copas., MD;  Location: [facility];  Service: Gastroenterology;;   thumb surgery Right    TOTAL KNEE ARTHROPLASTY Right 03/12/2016   Procedure: RIGHT TOTAL KNEE ARTHROPLASTY;  Surgeon: Paralee Cancel, MD;  [facility]: [facility];  Service: Orthopedics;  Laterality: Right;   UPPER ESOPHAGEAL ENDOSCOPIC ULTRASOUND (EUS) N/A 03/19/2021   Procedure: UPPER ESOPHAGEAL ENDOSCOPIC ULTRASOUND (EUS);  Surgeon: Irving Copas., MD;  Location: [facility];  Service: Gastroenterology;  Laterality: N/A;   UPPER GASTROINTESTINAL ENDOSCOPY     VENTRAL HERNIA REPAIR N/A 03/20/2018   Procedure: LAPAROSCOPIC VENTRAL INCISIONAL  HERNIA ERAS PATHWAY;  Surgeon: Alphonsa Overall, MD;  Location: [facility];  Service: General;  Laterality: N/A;       Family History  Problem Relation Age of Onset   Heart disease Mother    Coronary artery disease Father    Diabetes Father    Sudden death Father        due to heart disease   Heart disease Father    COPD Brother    Arthritis Brother    Heart failure Brother    Cancer Brother        lung    Stomach cancer Paternal Grandmother    Arthritis Daughter    Hypertension Other    Colon cancer Neg Hx    Esophageal cancer Neg Hx    Colon polyps Neg Hx    Ulcerative colitis Neg Hx    Liver disease Neg Hx    Pancreatic cancer Neg Hx    Inflammatory bowel disease Neg Hx    Rectal cancer Neg Hx     Social History   Tobacco Use   Smoking status: Former    Types: Cigarettes    Quit date: 05/20/1990    Years since quitting: 30.8   Smokeless tobacco: Never   Tobacco comments:    previous 30 pack year history  Vaping Use   Vaping Use: Never used  Substance Use Topics   Alcohol use: Yes    Comment: rare   Drug use: Never    Home Medications Prior to Admission medications   Medication Sig Start Date End Date Taking? Authorizing Provider  acetaminophen  (TYLENOL) 650 MG CR tablet Take 650 mg by mouth in the morning and at bedtime.   Yes [provider]  apixaban (ELIQUIS) 5 MG TABS tablet Take 1 tablet (5 mg total) by mouth 2 (two) times daily. 03/22/21  Yes Mansouraty, Telford Nab., MD  atorvastatin (LIPITOR) 20 MG tablet TAKE 1 TABLET BY  MOUTH DAILY Patient taking differently: Take 20 mg by mouth daily. 05/24/20  Yes [name], MD  b complex vitamins tablet Take 1 tablet by mouth daily with lunch.   Yes [provider]  carvedilol (COREG) 25 MG tablet TAKE 1 TABLET BY MOUTH 2 TIMES DAILY. GENERIC EQUIVALENT FOR COREG. Patient taking differently: Take 25 mg by mouth in the morning and at bedtime. 02/28/21  Yes [name], MD  cholecalciferol (VITAMIN D3) 25 MCG (1000 UNIT) tablet Take 1,000 Units by mouth daily.   Yes [provider]  Coenzyme Q10 (CO Q 10 PO) Take 200 mg by mouth daily with lunch.   Yes [provider]  diclofenac sodium (VOLTAREN) 1 % GEL Apply 1 application topically 4 (four) times daily as needed (pain).  10/02/16  Yes [provider]  ENTRESTO 49-51 MG TAKE 1 TABLET BY MOUTH TWICE DAILY Patient taking differently: Take 1 tablet by mouth 2 (two) times daily. 09/18/20  Yes Weaver, Scott T, PA-C  Flaxseed, Linseed, (FLAXSEED OIL PO) Take 1 capsule by mouth daily. With Omega 3   Yes [provider]  folic acid (FOLVITE) 169 MCG tablet Take 400 mcg by mouth daily.   Yes [provider]  Glucosamine HCl (GLUCOSAMINE PO) Take 1,500 mg by mouth every evening.   Yes [provider]  hydrocortisone 2.5 % cream Apply 1 application topically 2 (two) times daily as needed (rash on nose).  01/14/19  Yes [provider]  hydroxychloroquine (PLAQUENIL) 200 MG tablet TAKE 1 TABLET(200 MG) BY MOUTH DAILY Patient taking differently: Take 200 mg by mouth See admin instructions. Mon thru Fri only 02/02/21  Yes Ofilia Neas, PA-C  Lutein 20 MG TABS Take 20 mg by  mouth daily.   Yes [provider]  Multiple Vitamin (MULTIVITAMIN) capsule Take 1 capsule by mouth daily.     Yes [provider]  pantoprazole (PROTONIX) 40 MG tablet Take 1 tablet (40 mg total) by mouth 2 (two) times daily before a meal. TAKE 1 TABLET BY MOUTH EVERY DAY BEFORE BREAKFAST 03/19/21  Yes Mansouraty, Telford Nab., MD  Probiotic Product (PROBIOTIC DAILY PO) Take 1 capsule by mouth daily with lunch.    Yes [provider]  RESTASIS 0.05 % ophthalmic emulsion Place 1 drop into both eyes 2 (two) times daily. 01/12/21  Yes [provider]  Saw Palmetto, Serenoa repens, (SAW PALMETTO PO) Take 450 mg by mouth every evening.   Yes [provider]  spironolactone (ALDACTONE) 25 MG tablet TAKE 1/2 TABLET(12.5 MG) BY MOUTH DAILY Patient taking differently: Take 12.5 mg by mouth daily. 11/23/20  Yes [name], MD  sucralfate (CARAFATE) 1 g tablet Take 1 tablet (1 g total) by mouth 2 (two) times daily. 03/19/21  Yes Mansouraty, Telford Nab., MD  tamsulosin (FLOMAX) 0.4 MG CAPS capsule Take 0.4 mg by mouth daily.   Yes [provider]  Testosterone 20 % CREA Apply 2 mLs topically daily. Rub on shoulder   Yes [provider]  traMADol (ULTRAM) 50 MG tablet TAKE 1 TABLET(50 MG) BY MOUTH AT BEDTIME AS NEEDED Patient taking differently: Take 50 mg by mouth at bedtime. 03/14/21  Yes Deveshwar, Abel Presto, MD  TURMERIC PO Take 1 tablet by mouth daily with lunch.    Yes [provider]  vitamin C (ASCORBIC ACID) 500 MG tablet Take 500 mg by mouth daily.   Yes [provider]  zinc gluconate 50 MG tablet Take 50 mg by mouth daily.  Yes [provider]  nitroGLYCERIN (NITROSTAT) 0.4 MG SL tablet Place 1 tablet (0.4 mg total) under the tongue every 5 (five) minutes x 3 doses as needed for chest pain. 01/25/19   Tommie Raymond, NP  NON FORMULARY CPAP machine with sleep.    [provider]    Allergies     Sulfonamide derivatives  Review of Systems   Review of Systems  Gastrointestinal:  Positive for abdominal pain.  All other systems reviewed and are negative.  Physical Exam Updated Vital Signs BP 140/71   Pulse 86   Temp 98.7 F (37.1 C) (Oral)   Resp 18   Ht 5\' 7"  (1.702 m)   Wt 86.2 kg   SpO2 100%   BMI 29.76 kg/m   Physical Exam Vitals and nursing note reviewed.  Constitutional:      Appearance: Normal appearance.  HENT:     Head: Normocephalic.     Nose: Nose normal.     Mouth/Throat:     Mouth: Mucous membranes are moist.  Eyes:     Extraocular Movements: Extraocular movements intact.     Pupils: Pupils are equal, round, and reactive to light.  Cardiovascular:     Rate and Rhythm: Normal rate and regular rhythm.     Pulses: Normal pulses.     Heart sounds: Normal heart sounds.  Pulmonary:     Effort: Pulmonary effort is normal.     Breath sounds: Normal breath sounds.  Abdominal:     General: Abdomen is flat.     Palpations: Abdomen is soft.  Genitourinary:    Comments: Rectal- melena  Musculoskeletal:        General: Normal range of motion.     Cervical back: Normal range of motion and neck supple.  Skin:    General: Skin is warm.     Capillary Refill: Capillary refill takes less than 2 seconds.  Neurological:     General: No focal deficit present.     Mental Status: He is alert and oriented to person, place, and time.  Psychiatric:        Mood and Affect: Mood normal.        Behavior: Behavior normal.    ED Results / Procedures / Treatments   Labs (all labs ordered are listed, but only abnormal results are displayed) Labs Reviewed  COMPREHENSIVE METABOLIC PANEL - Abnormal; Notable for the following components:      Result Value   Glucose, Bld 149 (*)    BUN 38 (*)    Total Protein 6.3 (*)    Total Bilirubin 1.4 (*)    Anion gap 4 (*)    All other components within normal limits  POC OCCULT BLOOD, ED - Abnormal; Notable for the following  components:   Fecal Occult Bld POSITIVE (*)    All other components within normal limits  RESP PANEL BY RT-PCR (FLU A&B, COVID) ARPGX2  CBC  CBC WITH DIFFERENTIAL/PLATELET  TYPE AND SCREEN    EKG None  Radiology CT Angio Abd/Pel W and/or Wo Contrast  Result Date: 03/22/2021 CLINICAL DATA:  Gastrointestinal bleed. Right lower quadrant pain and nausea. EXAM: CTA ABDOMEN AND PELVIS WITHOUT AND WITH CONTRAST TECHNIQUE: Multidetector CT imaging of the abdomen and pelvis was performed using the standard protocol during bolus administration of intravenous contrast. Multiplanar reconstructed images and MIPs were obtained and reviewed to evaluate the vascular anatomy. CONTRAST:  132mL OMNIPAQUE IOHEXOL 350 MG/ML SOLN COMPARISON:  None. FINDINGS: VASCULAR No extravasation  of intravenous contrast noted. Aorta: Mild to moderate atherosclerotic plaque. Normal caliber aorta without aneurysm, dissection, vasculitis or significant stenosis. Celiac: Patent without evidence of aneurysm, dissection, vasculitis or significant stenosis. SMA: Patent without evidence of aneurysm, dissection, vasculitis or significant stenosis. Renals: Mild atherosclerotic plaque. Both renal arteries are patent without evidence of aneurysm, dissection, vasculitis, fibromuscular dysplasia or significant stenosis. IMA: Patent without evidence of aneurysm, dissection, vasculitis or significant stenosis. Inflow: Patent without evidence of aneurysm, dissection, vasculitis or significant stenosis. Proximal Outflow: Bilateral common femoral and visualized portions of the superficial and profunda femoral arteries are patent without evidence of aneurysm, dissection, vasculitis or significant stenosis. Veins: The main portal, splenic, superior mesenteric veins are patent. Review of the MIP images confirms the above findings. NON-VASCULAR Lower chest: No acute abnormality. Hepatobiliary: No focal liver abnormality. No gallstones, gallbladder wall  thickening, or pericholecystic fluid. Pneumobilia noted. Common bile duct stent noted with proximal tip within the mid common bile duct just past the pancreas. Distal tip noted within the 2nd/3rd portion of the duodenum. No intra or extrahepatic biliary ductal dilatation. Pancreas: Pancreatic sten noted with proximal tip within the proximal pancreas and distal tip within the 2nd/3rd portion of the duodenum. No focal lesion. Normal pancreatic contour. No surrounding inflammatory changes. No main pancreatic ductal dilatation. Spleen: Normal in size without focal abnormality. Adrenals/Urinary Tract: No adrenal nodule bilaterally. Bilateral kidneys enhance symmetrically. No hydronephrosis. No hydroureter. The urinary bladder is unremarkable. Stomach/Bowel: Stomach is within normal limits. No evidence of bowel wall thickening or dilatation. No pneumatosis. Appendix appears normal in caliber with no inflammatory changes within the right lower quadrant. Appendicolith is noted. Lymphatic: No lymphadenopathy. Reproductive: The prostate is enlarged measuring up to 5.1 cm. Other: No intraperitoneal free fluid. No intraperitoneal free gas. No organized fluid collection. Musculoskeletal: No abdominal wall hernia or abnormality. No suspicious lytic or blastic osseous lesions. No acute displaced fracture. Severe multilevel degenerative changes of the spine. IMPRESSION: VASCULAR 1. No CT evidence of active gastrointestinal bleed. 2. No acute intra-abdominal or intrapelvic abnormality. 3.  Aortic Atherosclerosis (ICD10-I70.0). NON-VASCULAR 1. No acute intra-abdominal or intrapelvic abnormality. 2. Common bile duct stent with proximal tip within the mid common bile duct just past the pancreas. Distal tip within the 2nd/3rd portion of the duodenum. Associated pneumobilia suggestive of patency. 3. Pancreatic stent with proximal tip within the proximal pancreas and distal tip within the 2nd/3rd portion of the duodenum. Electronically  Signed   By: Iven Finn M.D.   On: 03/22/2021 19:20    Procedures Procedures   Medications Ordered in ED Medications  sodium chloride 0.9 % bolus 1,000 mL (1,000 mLs Intravenous New Bag/Given 03/22/21 1814)  iohexol (OMNIPAQUE) 350 MG/ML injection 100 mL (100 mLs Intravenous Contrast Given 03/22/21 1830)    ED Course  I have reviewed the triage vital signs and the nursing notes.  Pertinent labs & imaging results that were available during my care of the patient were reviewed by me and considered in my medical decision making (see chart for details).    MDM Rules/Calculators/A&P                           [name] is a 74 y.o. male here with melena.  Patient had recent endoscopy that showed multiple polyps in his stomach and they also remove 1 polyp close to the duodenum and he had a stent placed.  He has been off and began to have melena.  He  is hemodynamically stable.  Plan to get a CTA to try and locate the bleeding.  We will get also get CBC and CMP  7:44 PM CBC is stable.  CT showed CBD stent in place.  There is associated pneumobilia that is suggestive of patency.  I discussed with Dr. Hilarie Fredrickson from GI.  He contacted the morning team and states that they plan to keep patient n.p.o. after midnight and patient is posted for repeat endoscopy for second look tomorrow.  Patient has remained off of Eliquis.    Final Clinical Impression(s) / ED Diagnoses Final diagnoses:  None    Rx / DC Orders ED Discharge Orders     None        Drenda Freeze, MD 03/22/21 1949

## 2021-03-22 NOTE — H&P (Addendum)
History and Physical    [name] HYW:[id number] DOB: Jun 19, 1945 DOA: 03/22/2021  PCP: Biagio Borg, MD  Patient coming from: Home   Chief Complaint:  Chief Complaint  Patient presents with   Blood In Stools     HPI:    75 year old male with past medical history of paroxysmal atrial fibrillation, coronary artery disease  (S/P RCA PCI in 2005, last cath 01/2019 with nonobstructive disease), ischemic cardiomyopathy S/P Medtronic CRT-D [id number]/5462 with systolic congestive heart failure (Echo 06/2020 EF 50-55%), LBBB, obstructive sleep apneaon CPAP, hypertension, hyperlipidemia, rheumatoid arthritis, hyperplasia, gastroesophageal reflux disease who presents to [facility] emergency department with melena from his gastroenterology clinic.  Of note, patient underwent EUS/ERCP on 10/31 by Dr. 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  EUS identified a ampullary lesion.  Biliary and pancreatic sphincterotomy were performed.  Snare endoscopic mucosal papillectomy of the major papilla was then performed with resection and retrieval.  A temporary plastic stent was placed into the ventral pancreatic duct followed by a plastic biliary stent placed into the common bile duct.  In the days following the procedure patient began to experience episodes of black stool.  These episodes persisted for the following 3 days, prompting the patient to contact his gastroenterologist.  Patient confirmed that he has not resumed his Eliquis postprocedure.  Patient complains of associated mild to moderate crampy generalized abdominal pain that waxes and wanes.  Patient denies nausea or vomiting.  Due to persisting melena and concern for bleeding patient was instructed to go to [facility] emergency department by his gastroenterologist.  Upon patient in the emergency  department hemoglobin was found to be 13.6 with no active bleeding noted in the emergency department.  Patient was noted to be hemodynamically stable.  ER provider discussed case with Dr. Hilarie Fredrickson with gastroenterology who recommended keeping patient n.p.o. after midnight with plan to proceed with upper endoscopy in the morning with Dr. Fuller Plan.  Patient was initiated on intravenous fluids.  The hospitalist group was then called to assess the patient for admission the hospital.    Review of Systems:   Review of Systems  Gastrointestinal:  Positive for melena.  All other systems reviewed and are negative.  Past Medical History:  Diagnosis Date   AICD (automatic cardioverter/defibrillator) present    Allergic rhinitis    Allergy    Anginal pain ([place]) 05/26/14   chest pain after chasing dog   Atypical nevus of back 04/27/2003   moderate - mid lower back   Basal cell carcinoma 01/26/2008   right cheek - MOHs   CAD (coronary artery disease)    hx of stent- 2005 RCA   Cataract    beginning stage both eyes   CHF (congestive heart failure) (HCC)    pacemaker Medtronic     Chronic back pain    intermittent   Constipation    Dizziness    Dysrhythmia    right bundle branch block    Esophageal stricture    GERD (gastroesophageal reflux disease)    Hemoptysis    Hiatal hernia    History of colonic polyps    hyperplastic   HTN (hypertension)    Hyperlipidemia    Hypertrophy of prostate with urinary obstruction and other lower urinary tract symptoms (LUTS)    OA (osteoarthritis)    OSA (obstructive sleep apnea)    cpap-  10    Other specified disorder of stomach and duodenum    duodenal periampulary tubulovillous adenoma removed by Dr. Ardis Hughs 5/10   Pericarditis 07/06/2019   Pneumonia    Pre-diabetes    no medications   Shortness of breath    with exertion   Sleep apnea    cpap   Testicular hypofunction     Past Surgical History:  Procedure Laterality Date   BILIARY STENT  PLACEMENT N/A 03/19/2021   Procedure: BILIARY STENT PLACEMENT;  Surgeon: Irving Copas., MD;  Location: [facility];  Service: Gastroenterology;  Laterality: N/A;   BIV ICD INSERTION CRT-D N/A 03/27/2017   Procedure: BIV ICD INSERTION CRT-D;  Surgeon: Constance Haw, MD;  Location: [place] CV LAB;  Service: Cardiovascular;  Laterality: N/A;   CARDIAC CATHETERIZATION     '05, last 2009, showing patent RCA stent   COLONOSCOPY  12/2007   HYPERPLASTIC POLYP   COLONOSCOPY  12/2019   COLONOSCOPY WITH PROPOFOL N/A 06/02/2014   Procedure: COLONOSCOPY WITH PROPOFOL;  Surgeon: [name], MD;  Location: [facility];  Service: Endoscopy;  Laterality: N/A;   CORONARY ANGIOPLASTY  08/2003   ENDOSCOPIC MUCOSAL RESECTION  03/19/2021   Procedure: ENDOSCOPIC MUCOSAL RESECTION, ampullectomy;  Surgeon: Irving Copas., MD;  Location: [facility];  Service: Gastroenterology;;   ENDOSCOPIC RETROGRADE CHOLANGIOPANCREATOGRAPHY (ERCP) WITH PROPOFOL N/A 03/19/2021   Procedure: ENDOSCOPIC RETROGRADE CHOLANGIOPANCREATOGRAPHY (ERCP) WITH PROPOFOL;  Surgeon: Irving Copas., MD;  Location: [facility];  Service: Gastroenterology;  Laterality: N/A;   ESOPHAGOGASTRODUODENOSCOPY (EGD) WITH PROPOFOL N/A 06/02/2014   Procedure: ESOPHAGOGASTRODUODENOSCOPY (EGD) WITH PROPOFOL;  Surgeon: [name], MD;  [facility];  Service: Endoscopy;  Laterality: N/A;   ESOPHAGOGASTRODUODENOSCOPY (EGD) WITH PROPOFOL N/A 03/19/2021   Procedure: ESOPHAGOGASTRODUODENOSCOPY (EGD) WITH PROPOFOL;  Surgeon: Rush Landmark Telford Nab., MD;  [facility];  Service: Gastroenterology;  Laterality: N/A;   hernia surgery x 3     Bilateral Inguinal, Umbicial   INSERTION OF MESH N/A 03/20/2018   Procedure: INSERTION OF MESH;  Surgeon: Alphonsa Overall, MD;  Location: [place];  Service: General;  Laterality: N/A;   IRRIGATION AND DEBRIDEMENT ABSCESS Left 08/14/2012   Procedure: IRRIGATION AND  DEBRIDEMENT LEFT INGUINAL BOIL ;  Surgeon: Ailene Rud, MD;  [facility];  Service: Urology;  Laterality: Left;   JOINT REPLACEMENT Right 2017   KNEE ARTHROPLASTY     LEFT HEART CATH AND CORONARY ANGIOGRAPHY N/A 01/24/2019   Procedure: LEFT HEART CATH AND CORONARY ANGIOGRAPHY;  Surgeon: Martinique, Peter M, MD;  Location: [place] CV LAB;  Service: Cardiovascular;  Laterality: N/A;   LEFT HEART CATHETERIZATION WITH CORONARY ANGIOGRAM N/A 05/26/2014   Procedure: LEFT HEART CATHETERIZATION WITH CORONARY ANGIOGRAM;  Surgeon: Blane Ohara, MD;  Location: [facility] CATH LAB;  Service: Cardiovascular;  Laterality: N/A;   LUMBAR LAMINECTOMY/DECOMPRESSION MICRODISCECTOMY Left 09/26/2014   Procedure: Lumbar Laminectomy for resection of synovial cyst  Lumbar five- sacral one left;  Surgeon: Kary Kos, MD;  Location: [place] [facility] OR[facility];  Service: Neurosurgery;  Laterality: Left;   MOLE REMOVAL Left 03/20/2018   Procedure: MOLE REMOVAL;  Surgeon: Alphonsa Overall, MD;  Location: [place];  Service: General;  Laterality: Left;   Oral surg to removed growth from ?sinus  10/2010   ?Dermoid removed by DrRiggs   PACEMAKER INSERTION  04/03/2017   PANCREATIC STENT PLACEMENT  03/19/2021   Procedure: PANCREATIC STENT PLACEMENT;  Surgeon: Irving Copas., MD;  [facility];  Service: Gastroenterology;;  POLYPECTOMY     RCA stenting     '05 RCA   right toe surgery  Right    Cyst    RIGHT/LEFT HEART CATH AND CORONARY ANGIOGRAPHY N/A 01/15/2017   Procedure: RIGHT/LEFT HEART CATH AND CORONARY ANGIOGRAPHY;  Surgeon: [name], MD;  Location: [place] CV LAB;  Service: Cardiovascular;  Laterality: N/A;   s/p right knee arthroscopy  2005   SPHINCTEROTOMY  03/19/2021   Procedure: SPHINCTEROTOMY;  Surgeon: Mansouraty, Telford Nab., MD;  Location: [facility];  Service: Gastroenterology;;   [place] INJECTION  03/19/2021   Procedure: SUBMUCOSAL LIFTING INJECTION;  Surgeon: Irving Copas., MD;  Location: [facility];  Service: Gastroenterology;;   thumb surgery Right    TOTAL KNEE ARTHROPLASTY Right 03/12/2016   Procedure: RIGHT TOTAL KNEE ARTHROPLASTY;  Surgeon: Paralee Cancel, MD;  Location: [facility];  Service: Orthopedics;  Laterality: Right;   UPPER ESOPHAGEAL ENDOSCOPIC ULTRASOUND (EUS) N/A 03/19/2021   Procedure: UPPER ESOPHAGEAL ENDOSCOPIC ULTRASOUND (EUS);  Surgeon: Irving Copas., MD;  Location: [facility];  Service: Gastroenterology;  Laterality: N/A;   UPPER GASTROINTESTINAL ENDOSCOPY     VENTRAL HERNIA REPAIR N/A 03/20/2018   Procedure: LAPAROSCOPIC VENTRAL INCISIONAL  HERNIA ERAS PATHWAY;  Surgeon: Alphonsa Overall, MD;  Location: [place];  Service: General;  Laterality: N/A;     reports that he quit smoking about 30 years ago. His smoking use included cigarettes. He has never used smokeless tobacco. He reports current alcohol use. He reports that he does not use drugs.  Allergies  Allergen Reactions   Sulfonamide Derivatives Rash         Family History  Problem Relation Age of Onset   Heart disease Mother    Coronary artery disease Father    Diabetes Father    Sudden death Father        due to heart disease   Heart disease Father    COPD Brother    Arthritis Brother    Heart failure Brother    Cancer Brother        lung    Stomach cancer Paternal Grandmother    Arthritis Daughter    Hypertension Other    Colon cancer Neg Hx    Esophageal cancer Neg Hx    Colon polyps Neg Hx    Ulcerative colitis Neg Hx    Liver disease Neg Hx    Pancreatic cancer Neg Hx    Inflammatory bowel disease Neg Hx    Rectal cancer Neg Hx      Prior to Admission medications   Medication Sig Start Date End Date Taking? Authorizing Provider  acetaminophen (TYLENOL) 650 MG CR tablet Take 650 mg by mouth in the morning and at bedtime.   Yes [provider]  apixaban (ELIQUIS) 5 MG TABS tablet Take 1 tablet (5 mg total) by mouth 2 (two) times daily.  03/22/21  Yes Mansouraty, Telford Nab., MD  atorvastatin (LIPITOR) 20 MG tablet TAKE 1 TABLET BY MOUTH DAILY Patient taking differently: Take 20 mg by mouth daily. 05/24/20  Yes [name], MD  b complex vitamins tablet Take 1 tablet by mouth daily with lunch.   Yes [provider]  carvedilol (COREG) 25 MG tablet TAKE 1 TABLET BY MOUTH 2 TIMES DAILY. GENERIC EQUIVALENT FOR COREG. Patient taking differently: Take 25 mg by mouth in the morning and at bedtime. 02/28/21  Yes [name], MD  cholecalciferol (VITAMIN D3) 25 MCG (1000 UNIT) tablet Take 1,000 Units by mouth daily.  Yes [provider]  Coenzyme Q10 (CO Q 10 PO) Take 200 mg by mouth daily with lunch.   Yes [provider]  diclofenac sodium (VOLTAREN) 1 % GEL Apply 1 application topically 4 (four) times daily as needed (pain).  10/02/16  Yes [provider]  ENTRESTO 49-51 MG TAKE 1 TABLET BY MOUTH TWICE DAILY Patient taking differently: Take 1 tablet by mouth 2 (two) times daily. 09/18/20  Yes Weaver, Scott T, PA-C  Flaxseed, Linseed, (FLAXSEED OIL PO) Take 1 capsule by mouth daily. With Omega 3   Yes [provider]  folic acid (FOLVITE) 659 MCG tablet Take 400 mcg by mouth daily.   Yes [provider]  Glucosamine HCl (GLUCOSAMINE PO) Take 1,500 mg by mouth every evening.   Yes [provider]  hydrocortisone 2.5 % cream Apply 1 application topically 2 (two) times daily as needed (rash on nose).  01/14/19  Yes [provider]  hydroxychloroquine (PLAQUENIL) 200 MG tablet TAKE 1 TABLET(200 MG) BY MOUTH DAILY Patient taking differently: Take 200 mg by mouth See admin instructions. Mon thru Fri only 02/02/21  Yes Ofilia Neas, PA-C  Lutein 20 MG TABS Take 20 mg by mouth daily.   Yes [provider]  Multiple Vitamin (MULTIVITAMIN) capsule Take 1 capsule by mouth daily.     Yes [provider]  pantoprazole (PROTONIX) 40 MG tablet Take 1 tablet  (40 mg total) by mouth 2 (two) times daily before a meal. TAKE 1 TABLET BY MOUTH EVERY DAY BEFORE BREAKFAST 03/19/21  Yes Mansouraty, Telford Nab., MD  Probiotic Product (PROBIOTIC DAILY PO) Take 1 capsule by mouth daily with lunch.    Yes [provider]  RESTASIS 0.05 % ophthalmic emulsion Place 1 drop into both eyes 2 (two) times daily. 01/12/21  Yes [provider]  Saw Palmetto, Serenoa repens, (SAW PALMETTO PO) Take 450 mg by mouth every evening.   Yes [provider]  spironolactone (ALDACTONE) 25 MG tablet TAKE 1/2 TABLET(12.5 MG) BY MOUTH DAILY Patient taking differently: Take 12.5 mg by mouth daily. 11/23/20  Yes [name], MD  sucralfate (CARAFATE) 1 g tablet Take 1 tablet (1 g total) by mouth 2 (two) times daily. 03/19/21  Yes Mansouraty, Telford Nab., MD  tamsulosin (FLOMAX) 0.4 MG CAPS capsule Take 0.4 mg by mouth daily.   Yes [provider]  Testosterone 20 % CREA Apply 2 mLs topically daily. Rub on shoulder   Yes [provider]  traMADol (ULTRAM) 50 MG tablet TAKE 1 TABLET(50 MG) BY MOUTH AT BEDTIME AS NEEDED Patient taking differently: Take 50 mg by mouth at bedtime. 03/14/21  Yes Deveshwar, Abel Presto, MD  TURMERIC PO Take 1 tablet by mouth daily with lunch.    Yes [provider]  vitamin C (ASCORBIC ACID) 500 MG tablet Take 500 mg by mouth daily.   Yes [provider]  zinc gluconate 50 MG tablet Take 50 mg by mouth daily.   Yes [provider]  nitroGLYCERIN (NITROSTAT) 0.4 MG SL tablet Place 1 tablet (0.4 mg total) under the tongue every 5 (five) minutes x 3 doses as needed for chest pain. 01/25/19   Tommie Raymond, NP  NON FORMULARY CPAP machine with sleep.    [provider]    Physical Exam: Vitals:   03/22/21 1930 03/22/21 2000 03/22/21 2030 03/22/21 2215  BP: 140/71 128/66 137/74 (!) 148/90  Pulse: 86 85 87 100  Resp: 18 18 15 14   Temp:  TempSrc:      SpO2: 100% 98% 97% 98%   Weight:      Height:        Constitutional: Awake alert and oriented x3, no associated distress.   Skin: no rashes, no lesions, good skin turgor noted. Eyes: Pupils are equally reactive to light.  No evidence of scleral icterus or conjunctival pallor.  ENMT: Moist mucous membranes noted.  Posterior pharynx clear of any exudate or lesions.   Neck: normal, supple, no masses, no thyromegaly.  No evidence of jugular venous distension.   Respiratory: clear to auscultation bilaterally, no wheezing, no crackles. Normal respiratory effort. No accessory muscle use.  Cardiovascular: Regular rate and rhythm, no murmurs / rubs / gallops. No extremity edema. 2+ pedal pulses. No carotid bruits.  Chest:   Nontender without crepitus or deformity.   Back:   Nontender without crepitus or deformity. Abdomen: Abdomen is soft and nontender.  No evidence of intra-abdominal masses.  Positive bowel sounds noted in all quadrants.   Musculoskeletal: No joint deformity upper and lower extremities. Good ROM, no contractures. Normal muscle tone.  Neurologic: CN 2-12 grossly intact. Sensation intact.  Patient moving all 4 extremities spontaneously.  Patient is following all commands.  Patient is responsive to verbal stimuli.   Psychiatric: Patient exhibits normal mood with appropriate affect.  Patient seems to possess insight as to their current situation.     Labs on Admission: I have personally reviewed following labs and imaging studies -   CBC: Recent Labs  Lab 03/22/21 0758 03/22/21 1116 03/22/21 1740  WBC 9.5 8.6 9.2  NEUTROABS  --   --  5.7  HGB 13.2 13.0 13.6  HCT 39.8 39.8 41.1  MCV 89.1 91.5 90.5  PLT 284.0 264 785   Basic Metabolic Panel: Recent Labs  Lab 03/22/21 0758 03/22/21 1116  NA 139 136  K 3.8 3.8  CL 106 107  CO2 25 25  GLUCOSE 98 149*  BUN 34* 38*  CREATININE 0.87 0.94  CALCIUM 9.3 8.9   GFR: Estimated Creatinine Clearance: 69 mL/min (by C-G formula based on SCr of 0.94  mg/dL). Liver Function Tests: Recent Labs  Lab 03/22/21 0758 03/22/21 1116  AST 16 17  ALT 17 19  ALKPHOS 53 52  BILITOT 1.2 1.4*  PROT 6.6 6.3*  ALBUMIN 4.2 3.9   Recent Labs  Lab 03/22/21 0758  LIPASE 22.0  AMYLASE 23*   No results for input(s): AMMONIA in the last 168 hours. Coagulation Profile: No results for input(s): INR, PROTIME in the last 168 hours. Cardiac Enzymes: No results for input(s): CKTOTAL, CKMB, CKMBINDEX, TROPONINI in the last 168 hours. BNP (last 3 results) No results for input(s): PROBNP in the last 8760 hours. HbA1C: No results for input(s): HGBA1C in the last 72 hours. CBG: No results for input(s): GLUCAP in the last 168 hours. Lipid Profile: No results for input(s): CHOL, HDL, LDLCALC, TRIG, CHOLHDL, LDLDIRECT in the last 72 hours. Thyroid Function Tests: No results for input(s): TSH, T4TOTAL, FREET4, T3FREE, THYROIDAB in the last 72 hours. Anemia Panel: No results for input(s): VITAMINB12, FOLATE, FERRITIN, TIBC, IRON, RETICCTPCT in the last 72 hours. Urine analysis:    Component Value Date/Time   COLORURINE YELLOW 01/18/2020 1440   APPEARANCEUR CLEAR 01/18/2020 1440   LABSPEC >=1.030 (A) 01/18/2020 1440   PHURINE 6.0 01/18/2020 1440   GLUCOSEU NEGATIVE 01/18/2020 1440   HGBUR TRACE-INTACT (A) 01/18/2020 1440   BILIRUBINUR NEGATIVE 01/18/2020 1440   KETONESUR TRACE (A) 01/18/2020 1440  UROBILINOGEN 4.0 (A) 01/18/2020 1440   NITRITE NEGATIVE 01/18/2020 1440   LEUKOCYTESUR NEGATIVE 01/18/2020 1440    Radiological Exams on Admission - Personally Reviewed: CT Angio Abd/Pel W and/or Wo Contrast  Result Date: 03/22/2021 CLINICAL DATA:  Gastrointestinal bleed. Right lower quadrant pain and nausea. EXAM: CTA ABDOMEN AND PELVIS WITHOUT AND WITH CONTRAST TECHNIQUE: Multidetector CT imaging of the abdomen and pelvis was performed using the standard protocol during bolus administration of intravenous contrast. Multiplanar reconstructed images and  MIPs were obtained and reviewed to evaluate the vascular anatomy. CONTRAST:  122mL OMNIPAQUE IOHEXOL 350 MG/ML SOLN COMPARISON:  None. FINDINGS: VASCULAR No extravasation of intravenous contrast noted. Aorta: Mild to moderate atherosclerotic plaque. Normal caliber aorta without aneurysm, dissection, vasculitis or significant stenosis. Celiac: Patent without evidence of aneurysm, dissection, vasculitis or significant stenosis. SMA: Patent without evidence of aneurysm, dissection, vasculitis or significant stenosis. Renals: Mild atherosclerotic plaque. Both renal arteries are patent without evidence of aneurysm, dissection, vasculitis, fibromuscular dysplasia or significant stenosis. IMA: Patent without evidence of aneurysm, dissection, vasculitis or significant stenosis. Inflow: Patent without evidence of aneurysm, dissection, vasculitis or significant stenosis. Proximal Outflow: Bilateral common femoral and visualized portions of the superficial and profunda femoral arteries are patent without evidence of aneurysm, dissection, vasculitis or significant stenosis. Veins: The main portal, splenic, superior mesenteric veins are patent. Review of the MIP images confirms the above findings. NON-VASCULAR Lower chest: No acute abnormality. Hepatobiliary: No focal liver abnormality. No gallstones, gallbladder wall thickening, or pericholecystic fluid. Pneumobilia noted. Common bile duct stent noted with proximal tip within the mid common bile duct just past the pancreas. Distal tip noted within the 2nd/3rd portion of the duodenum. No intra or extrahepatic biliary ductal dilatation. Pancreas: Pancreatic sten noted with proximal tip within the proximal pancreas and distal tip within the 2nd/3rd portion of the duodenum. No focal lesion. Normal pancreatic contour. No surrounding inflammatory changes. No main pancreatic ductal dilatation. Spleen: Normal in size without focal abnormality. Adrenals/Urinary Tract: No adrenal nodule  bilaterally. Bilateral kidneys enhance symmetrically. No hydronephrosis. No hydroureter. The urinary bladder is unremarkable. Stomach/Bowel: Stomach is within normal limits. No evidence of bowel wall thickening or dilatation. No pneumatosis. Appendix appears normal in caliber with no inflammatory changes within the right lower quadrant. Appendicolith is noted. Lymphatic: No lymphadenopathy. Reproductive: The prostate is enlarged measuring up to 5.1 cm. Other: No intraperitoneal free fluid. No intraperitoneal free gas. No organized fluid collection. Musculoskeletal: No abdominal wall hernia or abnormality. No suspicious lytic or blastic osseous lesions. No acute displaced fracture. Severe multilevel degenerative changes of the spine. IMPRESSION: VASCULAR 1. No CT evidence of active gastrointestinal bleed. 2. No acute intra-abdominal or intrapelvic abnormality. 3.  Aortic Atherosclerosis (ICD10-I70.0). NON-VASCULAR 1. No acute intra-abdominal or intrapelvic abnormality. 2. Common bile duct stent with proximal tip within the mid common bile duct just past the pancreas. Distal tip within the 2nd/3rd portion of the duodenum. Associated pneumobilia suggestive of patency. 3. Pancreatic stent with proximal tip within the proximal pancreas and distal tip within the 2nd/3rd portion of the duodenum. Electronically Signed   By: Iven Finn M.D.   On: 03/22/2021 19:20    Telemetry: Personally reviewed.  Rhythm is paced rhythm with heart rate of 80 bpm  Assessment/Plan  * Acute upper GI bleed Patient presenting with 3 days of melena after undergoing EUS/ERCP with sphincterotomies, papillectomies and stent placements Concern for ongoing upper gastrointestinal bleeding as a complication of the procedure Patient is been placed on intravenous Protonix 40 mg every  12 hours ER provider has discussed case with Dr. Hilarie Fredrickson with gastroenterology who recommends that the patient be n.p.o. after midnight for potential endoscopic  intervention by Dr. Quinn Axe in the morning N.p.o. after midnight Serial CBCs Type and screen We will transfuse if hemoglobin drops below 7  AF (paroxysmal atrial fibrillation) ([place]) Currently rate controlled. Holding anticoagulation for now due to concerns for gastrointestinal bleeding Continue home regimen of rate controlling agent Monitoring on telemetry   Coronary artery disease involving native coronary artery of native heart without angina pectoris Patient is currently chest pain free Monitoring patient on telemetry Continue home regimen of lipid lowering therapy and AV nodal blocking therapy Holding any form of anticoagulation due to suspicion for gastrointestinal bleeding   Chronic systolic heart failure (HCC) No clinical evidence of cardiogenic volume overload     Essential hypertension Resume patients home regimen of oral antihypertensives Titrate antihypertensive regimen as necessary to achieve adequate BP control PRN intravenous antihypertensives for excessively elevated blood pressure    Rheumatoid arthritis ([place]) Continue outpatient regimen of Plaquenil  Hyperlipidemia Continuing home regimen of lipid lowering therapy.   Benign prostatic hyperplasia with urinary obstruction Continue home regimen of Flomax  OSA on CPAP Continuing home regimen of CPAP nightly   Gastroesophageal reflux disease As noted above, patient is currently on intravenous proton pump inhibitor     Code Status:  DNR  code status decision has been confirmed with: patient Family Communication: Wife at the bedside has been updated on plan of care.   Status is: Observation  The patient remains OBS appropriate and will d/c before 2 midnights.       Vernelle Emerald MD [facility] Pager [phone]  If 7PM-7AM, please contact night-coverage [email or web address] Use universal [place] password for that web site. If you do not have the password, please call the hospital  operator.  03/23/2021, 12:05 AM

## 2021-03-23 ENCOUNTER — Encounter (HOSPITAL_COMMUNITY)
Admission: EM | Disposition: A | Discharge status: Home / Self Care | Payer: Self-pay | Source: Home / Self Care | Attending: Internal Medicine

## 2021-03-23 ENCOUNTER — Other Ambulatory Visit: Payer: Self-pay

## 2021-03-23 ENCOUNTER — Observation Stay (HOSPITAL_COMMUNITY): Payer: Medicare Other | Admitting: Anesthesiology

## 2021-03-23 ENCOUNTER — Encounter (HOSPITAL_COMMUNITY): Payer: Self-pay | Admitting: Internal Medicine

## 2021-03-23 DIAGNOSIS — E785 Hyperlipidemia, unspecified: Secondary | ICD-10-CM | POA: Diagnosis present

## 2021-03-23 DIAGNOSIS — K222 Esophageal obstruction: Secondary | ICD-10-CM

## 2021-03-23 DIAGNOSIS — Z833 Family history of diabetes mellitus: Secondary | ICD-10-CM | POA: Diagnosis not present

## 2021-03-23 DIAGNOSIS — N401 Enlarged prostate with lower urinary tract symptoms: Secondary | ICD-10-CM | POA: Diagnosis present

## 2021-03-23 DIAGNOSIS — N138 Other obstructive and reflux uropathy: Secondary | ICD-10-CM | POA: Diagnosis present

## 2021-03-23 DIAGNOSIS — K317 Polyp of stomach and duodenum: Secondary | ICD-10-CM | POA: Diagnosis not present

## 2021-03-23 DIAGNOSIS — Z20822 Contact with and (suspected) exposure to covid-19: Secondary | ICD-10-CM | POA: Diagnosis present

## 2021-03-23 DIAGNOSIS — Z800 Family history of malignant neoplasm of digestive organs: Secondary | ICD-10-CM | POA: Diagnosis not present

## 2021-03-23 DIAGNOSIS — D62 Acute posthemorrhagic anemia: Secondary | ICD-10-CM | POA: Diagnosis present

## 2021-03-23 DIAGNOSIS — Z95810 Presence of automatic (implantable) cardiac defibrillator: Secondary | ICD-10-CM | POA: Diagnosis not present

## 2021-03-23 DIAGNOSIS — K922 Gastrointestinal hemorrhage, unspecified: Secondary | ICD-10-CM | POA: Diagnosis not present

## 2021-03-23 DIAGNOSIS — K269 Duodenal ulcer, unspecified as acute or chronic, without hemorrhage or perforation: Secondary | ICD-10-CM | POA: Diagnosis not present

## 2021-03-23 DIAGNOSIS — I110 Hypertensive heart disease with heart failure: Secondary | ICD-10-CM | POA: Diagnosis present

## 2021-03-23 DIAGNOSIS — K264 Chronic or unspecified duodenal ulcer with hemorrhage: Secondary | ICD-10-CM | POA: Diagnosis present

## 2021-03-23 DIAGNOSIS — I2510 Atherosclerotic heart disease of native coronary artery without angina pectoris: Secondary | ICD-10-CM | POA: Diagnosis present

## 2021-03-23 DIAGNOSIS — Z7901 Long term (current) use of anticoagulants: Secondary | ICD-10-CM | POA: Diagnosis not present

## 2021-03-23 DIAGNOSIS — I5022 Chronic systolic (congestive) heart failure: Secondary | ICD-10-CM | POA: Diagnosis present

## 2021-03-23 DIAGNOSIS — Z87891 Personal history of nicotine dependence: Secondary | ICD-10-CM | POA: Diagnosis not present

## 2021-03-23 DIAGNOSIS — Z66 Do not resuscitate: Secondary | ICD-10-CM | POA: Diagnosis present

## 2021-03-23 DIAGNOSIS — I255 Ischemic cardiomyopathy: Secondary | ICD-10-CM | POA: Diagnosis present

## 2021-03-23 DIAGNOSIS — Z8249 Family history of ischemic heart disease and other diseases of the circulatory system: Secondary | ICD-10-CM | POA: Diagnosis not present

## 2021-03-23 DIAGNOSIS — Z955 Presence of coronary angioplasty implant and graft: Secondary | ICD-10-CM | POA: Diagnosis not present

## 2021-03-23 DIAGNOSIS — G4733 Obstructive sleep apnea (adult) (pediatric): Secondary | ICD-10-CM | POA: Diagnosis present

## 2021-03-23 DIAGNOSIS — K219 Gastro-esophageal reflux disease without esophagitis: Secondary | ICD-10-CM | POA: Diagnosis present

## 2021-03-23 DIAGNOSIS — I480 Paroxysmal atrial fibrillation: Secondary | ICD-10-CM | POA: Diagnosis present

## 2021-03-23 DIAGNOSIS — K921 Melena: Secondary | ICD-10-CM | POA: Diagnosis not present

## 2021-03-23 DIAGNOSIS — K449 Diaphragmatic hernia without obstruction or gangrene: Secondary | ICD-10-CM | POA: Diagnosis not present

## 2021-03-23 DIAGNOSIS — M069 Rheumatoid arthritis, unspecified: Secondary | ICD-10-CM | POA: Diagnosis present

## 2021-03-23 DIAGNOSIS — Z79899 Other long term (current) drug therapy: Secondary | ICD-10-CM | POA: Diagnosis not present

## 2021-03-23 HISTORY — PX: ESOPHAGOGASTRODUODENOSCOPY (EGD) WITH PROPOFOL: SHX5813

## 2021-03-23 LAB — COMPREHENSIVE METABOLIC PANEL
ALT: 20 U/L (ref 0–44)
AST: 17 U/L (ref 15–41)
Albumin: 3.6 g/dL (ref 3.5–5.0)
Alkaline Phosphatase: 49 U/L (ref 38–126)
Anion gap: 8 (ref 5–15)
BUN: 29 mg/dL — ABNORMAL HIGH (ref 8–23)
CO2: 23 mmol/L (ref 22–32)
Calcium: 8.9 mg/dL (ref 8.9–10.3)
Chloride: 108 mmol/L (ref 98–111)
Creatinine, Ser: 0.69 mg/dL (ref 0.61–1.24)
GFR, Estimated: >60 mL/min (ref >60)
Glucose, Bld: 100 mg/dL — ABNORMAL HIGH (ref 70–99)
Potassium: 4 mmol/L (ref 3.5–5.1)
Sodium: 139 mmol/L (ref 135–145)
Total Bilirubin: 1.2 mg/dL (ref 0.3–1.2)
Total Protein: 5.9 g/dL — ABNORMAL LOW (ref 6.5–8.1)

## 2021-03-23 LAB — MAGNESIUM: Magnesium: 2.3 mg/dL (ref 1.7–2.4)

## 2021-03-23 LAB — CBC
HCT: 35.7 % — ABNORMAL LOW (ref 39.0–52.0)
HCT: 36.5 % — ABNORMAL LOW (ref 39.0–52.0)
Hemoglobin: 12.1 g/dL — ABNORMAL LOW (ref 13.0–17.0)
Hemoglobin: 11.9 g/dL — ABNORMAL LOW (ref 13.0–17.0)
MCH: 30.1 pg (ref 26.0–34.0)
MCH: 30.1 pg (ref 26.0–34.0)
MCHC: 33.9 g/dL (ref 30.0–36.0)
MCHC: 32.6 g/dL (ref 30.0–36.0)
MCV: 88.8 fL (ref 80.0–100.0)
MCV: 92.2 fL (ref 80.0–100.0)
Platelets: 246 10*3/uL (ref 150–400)
Platelets: 226 10*3/uL (ref 150–400)
RBC: 4.02 MIL/uL — ABNORMAL LOW (ref 4.22–5.81)
RBC: 3.96 MIL/uL — ABNORMAL LOW (ref 4.22–5.81)
RDW: 13.3 % (ref 11.5–15.5)
RDW: 13.2 % (ref 11.5–15.5)
WBC: 8.3 10*3/uL (ref 4.0–10.5)
WBC: 6.7 10*3/uL (ref 4.0–10.5)
nRBC: 0 % (ref 0.0–0.2)
nRBC: 0 % (ref 0.0–0.2)

## 2021-03-23 LAB — PROTIME-INR
INR: 1.1 (ref 0.8–1.2)
Prothrombin Time: 13.9 seconds (ref 11.4–15.2)

## 2021-03-23 LAB — APTT: aPTT: 31 seconds (ref 24–36)

## 2021-03-23 SURGERY — ESOPHAGOGASTRODUODENOSCOPY (EGD) WITH PROPOFOL
Anesthesia: Monitor Anesthesia Care

## 2021-03-23 MED ORDER — EPHEDRINE SULFATE 50 MG/ML IJ SOLN
INTRAMUSCULAR | Status: DC | PRN
Start: 2021-03-23 — End: 2021-03-23
  Administered 2021-03-23: 5 mg via INTRAVENOUS

## 2021-03-23 MED ORDER — PROPOFOL 500 MG/50ML IV EMUL
INTRAVENOUS | Status: DC | PRN
Start: 2021-03-23 — End: 2021-03-23
  Administered 2021-03-23: 120 ug/kg/min via INTRAVENOUS

## 2021-03-23 MED ORDER — PROPOFOL 10 MG/ML IV BOLUS
INTRAVENOUS | Status: DC | PRN
  Administered 2021-03-23: 50 mg via INTRAVENOUS
  Administered 2021-03-23: 20 mg via INTRAVENOUS

## 2021-03-23 MED ORDER — MELATONIN 5 MG PO TABS
5.0000 mg | ORAL_TABLET | Freq: Once | ORAL | Status: AC
  Administered 2021-03-23: 5 mg via ORAL
  Filled 2021-03-23: qty 1

## 2021-03-23 MED ORDER — LACTATED RINGERS IV SOLN: INTRAVENOUS | Status: DC

## 2021-03-23 MED ORDER — HYDRALAZINE HCL 20 MG/ML IJ SOLN: 10.0000 mg | Freq: Four times a day (QID) | INTRAMUSCULAR | Status: DC | PRN

## 2021-03-23 MED ORDER — SODIUM CHLORIDE 0.9 % IV SOLN: INTRAVENOUS | Status: DC

## 2021-03-23 MED ORDER — PHENYLEPHRINE HCL (PRESSORS) 10 MG/ML IV SOLN
INTRAVENOUS | Status: DC | PRN
  Administered 2021-03-23: 120 ug via INTRAVENOUS
  Administered 2021-03-23 (×2): 80 ug via INTRAVENOUS

## 2021-03-23 SURGICAL SUPPLY — 15 items

## 2021-03-23 NOTE — ED Notes (Signed)
Per floor RN, housekeeping arrived to clean room, will be able to transport patient when room is cleaned.

## 2021-03-23 NOTE — H&P (View-Only) (Signed)
Referring Provider:  EDP Primary Care Physician:  Biagio Borg, MD Primary Gastroenterologist:  Dr.   Luiz Iron for Consultation:  GI bleed  HPI: [name] is a 75 y.o. male past medical history of paroxysmal atrial fibrillation on Eliquis, coronary artery disease  (S/P RCA PCI in 2005, last cath 01/2019 with nonobstructive disease), ischemic cardiomyopathy S/P Medtronic CRT-D 69/6789 with systolic congestive heart failure (Echo 06/2020 EF 50-55%), LBBB, obstructive sleep apnea on CPAP, hypertension, hyperlipidemia, rheumatoid arthritis, and gastroesophageal reflux disease who presents to [facility] ED with complaints of black stool.  He had EUS and ERCP with Dr. Rush Landmark on 10/31 with ampullectomy for ampullary adenoma.  Had stents placed as well as below.  Says that Tuesday night he started noticing the black stools and really noticed it Wednesday morning but kept hoping that it would stop.  Thursday morning had another jet black stool so was instructed to come to the ED per our office.    BUN elevated at 38.  Hgb at 12.1 grams this AM down from about 14 gram baseline.  CT angio showed the following:  IMPRESSION: VASCULAR   1. No CT evidence of active gastrointestinal bleed. 2. No acute intra-abdominal or intrapelvic abnormality. 3.  Aortic Atherosclerosis (ICD10-I70.0).   NON-VASCULAR   1. No acute intra-abdominal or intrapelvic abnormality. 2. Common bile duct stent with proximal tip within the mid common bile duct just past the pancreas. Distal tip within the 2nd/3rd portion of the duodenum. Associated pneumobilia suggestive of patency. 3. Pancreatic stent with proximal tip within the proximal pancreas and distal tip within the 2nd/3rd portion of the duodenum.   Says that he's had some nausea too but no vomiting.  A little soreness in upper abdomen.  A lot of gas.  Had two BMs that were really black while in the ED.  None so far this AM.   ERCP on 10/31 as follow:  - Ampullary  lesion noted as on EUS. - A biliary sphincterotomy was performed. - A pancreatic sphincterotomy was performed. - The major papilla was successfully injected. Snare endoscopic mucosal papillectomy of the major papilla was performed. Resection and retrieval were complete. - One temporary plastic pancreatic stent was placed into the ventral pancreatic duct. - One plastic biliary stent was placed into the common bile duct.  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  Past Medical History:  Diagnosis Date   AICD (automatic cardioverter/defibrillator) present    Allergic rhinitis    Allergy    Anginal pain ([place]) 05/26/14   chest pain after chasing dog   Atypical nevus of back 04/27/2003   moderate - mid lower back   Basal cell carcinoma 01/26/2008   right cheek - MOHs   CAD (coronary artery disease)    hx of stent- 2005 RCA   Cataract    beginning stage both eyes   CHF (congestive heart failure) (HCC)    pacemaker Medtronic     Chronic back pain    intermittent   Constipation    Dizziness    Dysrhythmia    right bundle branch block    Esophageal stricture    GERD (gastroesophageal reflux disease)    Hemoptysis    Hiatal hernia    History of colonic polyps    hyperplastic   HTN (hypertension)    Hyperlipidemia    Hypertrophy of prostate with urinary obstruction and other lower urinary tract symptoms (LUTS)    OA (osteoarthritis)    OSA (obstructive  sleep apnea)    cpap- 10    Other specified disorder of stomach and duodenum    duodenal periampulary tubulovillous adenoma removed by Dr. Ardis Hughs 5/10   Pericarditis 07/06/2019   Pneumonia    Pre-diabetes    no medications   Shortness of breath    with exertion   Sleep apnea    cpap   Testicular hypofunction     Past Surgical History:  Procedure Laterality Date   BILIARY STENT PLACEMENT N/A 03/19/2021   Procedure: BILIARY STENT PLACEMENT;  Surgeon: Irving Copas., MD;  Location: [facility];  Service: Gastroenterology;  Laterality: N/A;   BIV ICD INSERTION CRT-D N/A 03/27/2017   Procedure: BIV ICD INSERTION CRT-D;  Surgeon: Constance Haw, MD;  Location: [place] CV LAB;  Service: Cardiovascular;  Laterality: N/A;   CARDIAC CATHETERIZATION     '05, last 2009, showing patent RCA stent   COLONOSCOPY  12/2007   HYPERPLASTIC POLYP   COLONOSCOPY  12/2019   COLONOSCOPY WITH PROPOFOL N/A 06/02/2014   Procedure: COLONOSCOPY WITH PROPOFOL;  Surgeon: [name], MD;  Location: [facility]OS[facility];  Service: Endoscopy;  Laterality: N/A;   CORONARY ANGIOPLASTY  08/2003   ENDOSCOPIC MUCOSAL RESECTION  03/19/2021   Procedure: ENDOSCOPIC MUCOSAL RESECTION, ampullectomy;  Surgeon: Irving Copas., MD;  [facility]: [facility];  Service: Gastroenterology;;   ENDOSCOPIC RETROGRADE CHOLANGIOPANCREATOGRAPHY (ERCP) WITH PROPOFOL N/A 03/19/2021   Procedure: ENDOSCOPIC RETROGRADE CHOLANGIOPANCREATOGRAPHY (ERCP) WITH PROPOFOL;  Surgeon: Irving Copas., MD;  Location: [facility];  Service: Gastroenterology;  Laterality: N/A;   ESOPHAGOGASTRODUODENOSCOPY (EGD) WITH PROPOFOL N/A 06/02/2014   Procedure: ESOPHAGOGASTRODUODENOSCOPY (EGD) WITH PROPOFOL;  Surgeon: [name], MD;  [facility];  Service: Endoscopy;  Laterality: N/A;   ESOPHAGOGASTRODUODENOSCOPY (EGD) WITH PROPOFOL N/A 03/19/2021   Procedure: ESOPHAGOGASTRODUODENOSCOPY (EGD) WITH PROPOFOL;  Surgeon: Rush Landmark Telford Nab., MD;  [facility];  Service: Gastroenterology;  Laterality: N/A;   hernia surgery x 3     Bilateral Inguinal, Umbicial   INSERTION OF MESH N/A 03/20/2018   Procedure: INSERTION OF MESH;  Surgeon: Alphonsa Overall, MD;  Location: [place];  Service: General;  Laterality: N/A;   IRRIGATION AND DEBRIDEMENT ABSCESS Left 08/14/2012   Procedure: IRRIGATION AND DEBRIDEMENT LEFT INGUINAL BOIL ;  Surgeon: Ailene Rud, MD;  [facility];  Service: Urology;  Laterality: Left;    JOINT REPLACEMENT Right 2017   KNEE ARTHROPLASTY     LEFT HEART CATH AND CORONARY ANGIOGRAPHY N/A 01/24/2019   Procedure: LEFT HEART CATH AND CORONARY ANGIOGRAPHY;  Surgeon: Martinique, Peter M, MD;  Location: [place] CV LAB;  Service: Cardiovascular;  Laterality: N/A;   LEFT HEART CATHETERIZATION WITH CORONARY ANGIOGRAM N/A 05/26/2014   Procedure: LEFT HEART CATHETERIZATION WITH CORONARY ANGIOGRAM;  Surgeon: Blane Ohara, MD;  Location: [facility] CATH LAB;  Service: Cardiovascular;  Laterality: N/A;   LUMBAR LAMINECTOMY/DECOMPRESSION MICRODISCECTOMY Left 09/26/2014   Procedure: Lumbar Laminectomy for resection of synovial cyst  Lumbar five- sacral one left;  Surgeon: Kary Kos, MD;  Location: [place] NEURO ORS;  Service: Neurosurgery;  Laterality: Left;   MOLE REMOVAL Left 03/20/2018   Procedure: MOLE REMOVAL;  Surgeon: Alphonsa Overall, MD;  Location: [place];  Service: General;  Laterality: Left;   Oral surg to removed growth from ?sinus  10/2010   ?Dermoid removed by DrRiggs   PACEMAKER INSERTION  04/03/2017   PANCREATIC STENT PLACEMENT  03/19/2021   Procedure: PANCREATIC STENT PLACEMENT;  Surgeon: Irving Copas., MD;  Location: [facility];  Service: Gastroenterology;;   POLYPECTOMY     RCA stenting     '05 RCA   right toe surgery  Right    Cyst    RIGHT/LEFT HEART CATH AND CORONARY ANGIOGRAPHY N/A 01/15/2017   Procedure: RIGHT/LEFT HEART CATH AND CORONARY ANGIOGRAPHY;  Surgeon: [name], MD;  Location: [place] CV LAB;  Service: Cardiovascular;  Laterality: N/A;   s/p right knee arthroscopy  2005   SPHINCTEROTOMY  03/19/2021   Procedure: SPHINCTEROTOMY;  Surgeon: Mansouraty, Telford Nab., MD;  Location: [facility];  Service: Gastroenterology;;   [place] INJECTION  03/19/2021   Procedure: SUBMUCOSAL LIFTING INJECTION;  Surgeon: Irving Copas., MD;  Location: [facility];  Service: Gastroenterology;;   thumb surgery Right    TOTAL KNEE ARTHROPLASTY Right  03/12/2016   Procedure: RIGHT TOTAL KNEE ARTHROPLASTY;  Surgeon: Paralee Cancel, MD;  [facility];  Service: Orthopedics;  Laterality: Right;   UPPER ESOPHAGEAL ENDOSCOPIC ULTRASOUND (EUS) N/A 03/19/2021   Procedure: UPPER ESOPHAGEAL ENDOSCOPIC ULTRASOUND (EUS);  Surgeon: Irving Copas., MD;  Location: [facility];  Service: Gastroenterology;  Laterality: N/A;   UPPER GASTROINTESTINAL ENDOSCOPY     VENTRAL HERNIA REPAIR N/A 03/20/2018   Procedure: [place];  Surgeon: Alphonsa Overall, MD;  Location: [place];  Service: General;  Laterality: N/A;    Prior to Admission medications   Medication Sig Start Date End Date Taking? Authorizing Provider  acetaminophen (TYLENOL) 650 MG CR tablet Take 650 mg by mouth in the morning and at bedtime.   Yes [provider]  apixaban (ELIQUIS) 5 MG TABS tablet Take 1 tablet (5 mg total) by mouth 2 (two) times daily. 03/22/21  Yes Mansouraty, Telford Nab., MD  atorvastatin (LIPITOR) 20 MG tablet TAKE 1 TABLET BY MOUTH DAILY Patient taking differently: Take 20 mg by mouth daily. 05/24/20  Yes [name], MD  b complex vitamins tablet Take 1 tablet by mouth daily with lunch.   Yes [provider]  carvedilol (COREG) 25 MG tablet TAKE 1 TABLET BY MOUTH 2 TIMES DAILY. GENERIC EQUIVALENT FOR COREG. Patient taking differently: Take 25 mg by mouth in the morning and at bedtime. 02/28/21  Yes [name], MD  cholecalciferol (VITAMIN D3) 25 MCG (1000 UNIT) tablet Take 1,000 Units by mouth daily.   Yes [provider]  Coenzyme Q10 (CO Q 10 PO) Take 200 mg by mouth daily with lunch.   Yes [provider]  diclofenac sodium (VOLTAREN) 1 % GEL Apply 1 application topically 4 (four) times daily as needed (pain).  10/02/16  Yes [provider]  ENTRESTO 49-51 MG TAKE 1 TABLET BY MOUTH TWICE DAILY Patient taking differently: Take 1 tablet by mouth 2 (two) times daily. 09/18/20   Yes Weaver, Scott T, PA-C  Flaxseed, Linseed, (FLAXSEED OIL PO) Take 1 capsule by mouth daily. With Omega 3   Yes [provider]  folic acid (FOLVITE) 951 MCG tablet Take 400 mcg by mouth daily.   Yes [provider]  Glucosamine HCl (GLUCOSAMINE PO) Take 1,500 mg by mouth every evening.   Yes [provider]  hydrocortisone 2.5 % cream Apply 1 application topically 2 (two) times daily as needed (rash on nose).  01/14/19  Yes [provider]  hydroxychloroquine (PLAQUENIL) 200 MG tablet TAKE 1 TABLET(200 MG) BY MOUTH DAILY Patient taking differently: Take 200 mg by mouth See admin instructions. Mon thru Fri only 02/02/21  Yes Ofilia Neas, PA-C  Lutein 20 MG TABS Take 20 mg by mouth daily.   Yes [provider]  Multiple Vitamin (MULTIVITAMIN) capsule Take 1 capsule by mouth daily.     Yes [provider]  pantoprazole (PROTONIX) 40 MG tablet Take 1 tablet (40 mg total) by mouth 2 (two) times daily before a meal. TAKE 1 TABLET BY MOUTH EVERY DAY BEFORE BREAKFAST 03/19/21  Yes Mansouraty, Telford Nab., MD  Probiotic Product (PROBIOTIC DAILY PO) Take 1 capsule by mouth daily with lunch.    Yes [provider]  RESTASIS 0.05 % ophthalmic emulsion Place 1 drop into both eyes 2 (two) times daily. 01/12/21  Yes [provider]  Saw Palmetto, Serenoa repens, (SAW PALMETTO PO) Take 450 mg by mouth every evening.   Yes [provider]  spironolactone (ALDACTONE) 25 MG tablet TAKE 1/2 TABLET(12.5 MG) BY MOUTH DAILY Patient taking differently: Take 12.5 mg by mouth daily. 11/23/20  Yes [name], MD  sucralfate (CARAFATE) 1 g tablet Take 1 tablet (1 g total) by mouth 2 (two) times daily. 03/19/21  Yes Mansouraty, Telford Nab., MD  tamsulosin (FLOMAX) 0.4 MG CAPS capsule Take 0.4 mg by mouth daily.   Yes [provider]  Testosterone 20 % CREA Apply 2 mLs topically daily. Rub on shoulder   Yes [provider]   traMADol (ULTRAM) 50 MG tablet TAKE 1 TABLET(50 MG) BY MOUTH AT BEDTIME AS NEEDED Patient taking differently: Take 50 mg by mouth at bedtime. 03/14/21  Yes Deveshwar, Abel Presto, MD  TURMERIC PO Take 1 tablet by mouth daily with lunch.    Yes [provider]  vitamin C (ASCORBIC ACID) 500 MG tablet Take 500 mg by mouth daily.   Yes [provider]  zinc gluconate 50 MG tablet Take 50 mg by mouth daily.   Yes [provider]  nitroGLYCERIN (NITROSTAT) 0.4 MG SL tablet Place 1 tablet (0.4 mg total) under the tongue every 5 (five) minutes x 3 doses as needed for chest pain. 01/25/19   Tommie Raymond, NP  NON FORMULARY CPAP machine with sleep.    [provider]    Current Facility-Administered Medications  Medication Dose Route Frequency Provider Last Rate Last Admin   acetaminophen (TYLENOL) tablet 650 mg  650 mg Oral Q6H PRN Shalhoub, Sherryll Burger, MD       Or   acetaminophen (TYLENOL) suppository 650 mg  650 mg Rectal Q6H PRN Shalhoub, Sherryll Burger, MD       atorvastatin (LIPITOR) tablet 20 mg  20 mg Oral Daily Shalhoub, Sherryll Burger, MD       carvedilol (COREG) tablet 25 mg  25 mg Oral BID WC Shalhoub, Sherryll Burger, MD       cycloSPORINE (RESTASIS) 0.05 % ophthalmic emulsion 1 drop  1 drop Both Eyes BID Shalhoub, Sherryll Burger, MD       folic acid (FOLVITE) tablet 0.5 mg  500 mcg Oral Daily Shalhoub, Sherryll Burger, MD       hydrALAZINE (APRESOLINE) injection 10 mg  10 mg Intravenous Q6H PRN Shalhoub, Sherryll Burger, MD       hydroxychloroquine (PLAQUENIL) tablet 200 mg  200 mg Oral Once per day on Mon Tue Wed Thu Fri Vernelle Emerald, MD       ondansetron [facility]) tablet 4 mg  4 mg Oral Q6H PRN Vernelle Emerald, MD       Or   ondansetron [facility]) injection 4 mg  4 mg Intravenous Q6H PRN Shalhoub, Sherryll Burger,  MD       pantoprazole (PROTONIX) injection 40 mg  40 mg Intravenous Q12H Shalhoub, Sherryll Burger, MD   40 mg at 03/23/21 0028   polyethylene glycol (MIRALAX / GLYCOLAX) packet 17 g  17 g  Oral Daily PRN Shalhoub, Sherryll Burger, MD       sacubitril-valsartan (ENTRESTO) 49-51 mg per tablet  1 tablet Oral BID Shalhoub, Sherryll Burger, MD       spironolactone (ALDACTONE) tablet 12.5 mg  12.5 mg Oral Daily Shalhoub, Sherryll Burger, MD       sucralfate (CARAFATE) tablet 1 g  1 g Oral TID WC & HS Shalhoub, Sherryll Burger, MD       tamsulosin (FLOMAX) capsule 0.4 mg  0.4 mg Oral Daily Shalhoub, Sherryll Burger, MD       traMADol Veatrice Bourbon) tablet 50 mg  50 mg Oral QHS PRN Shalhoub, Sherryll Burger, MD        Allergies as of 03/22/2021 - Review Complete 03/22/2021  Allergen Reaction Noted   Sulfonamide derivatives Rash 06/26/2010    Family History  Problem Relation Age of Onset   Heart disease Mother    Coronary artery disease Father    Diabetes Father    Sudden death Father        due to heart disease   Heart disease Father    COPD Brother    Arthritis Brother    Heart failure Brother    Cancer Brother        lung    Stomach cancer Paternal Grandmother    Arthritis Daughter    Hypertension Other    Colon cancer Neg Hx    Esophageal cancer Neg Hx    Colon polyps Neg Hx    Ulcerative colitis Neg Hx    Liver disease Neg Hx    Pancreatic cancer Neg Hx    Inflammatory bowel disease Neg Hx    Rectal cancer Neg Hx     Social History   Socioeconomic History   Marital status: Married    Spouse name: Not on file   Number of children: 1   Years of education: Not on file   Highest education level: Not on file  Occupational History   Occupation: Lobbyist: [employer]  Tobacco Use   Smoking status: Former    Types: Cigarettes    Quit date: 05/20/1990    Years since quitting: 30.8   Smokeless tobacco: Never   Tobacco comments:    previous 30 pack year history  Vaping Use   Vaping Use: Never used  Substance and Sexual Activity   Alcohol use: Yes    Comment: rare   Drug use: Never   Sexual activity: Not on file  Other Topics Concern   Not on file  Social History Narrative    Not on file   Social Determinants of Health   Financial Resource Strain: Not on file  Food Insecurity: Not on file  Transportation Needs: Not on file  Physical Activity: Not on file  Stress: Not on file  Social Connections: Not on file  Intimate Partner Violence: Not on file    Review of Systems: ROS is O/W negative except as mentioned in HPI.  Physical Exam: Vital signs in last 24 hours: Temp:  [98.1 F (36.7 C)-98.7 F (37.1 C)] 98.1 F (36.7 C) (11/04 0804) Pulse Rate:  [80-100] 83 (11/04 0804) Resp:  [14-23] 20 (11/04 0804) BP: (102-148)/(57-90) 122/72 (11/04 0804) SpO2:  [93 %-  100 %] 98 % (11/04 0804) Weight:  [86.2 kg] 86.2 kg (11/03 1105)   General:  Alert, Well-developed, well-nourished, pleasant and cooperative in NAD Head:  Normocephalic and atraumatic. Eyes:  Sclera clear, no icterus.  Conjunctiva pink. Ears:  Normal auditory acuity. Mouth:  No deformity or lesions.   Lungs:  Clear throughout to auscultation.  No wheezes, crackles, or rhonchi.  Heart:  Regular rate and rhythm; no murmurs, clicks, rubs,  or gallops. Abdomen:  Soft, non-distended.  BS present.  Minimal epigastric TTP. Rectal:  Deferred.  Black stool, heme positive per EDP.  Msk:  Symmetrical without gross deformities. Pulses:  Normal pulses noted. Extremities:  Without clubbing or edema. Neurologic:  Alert and oriented x 4;  grossly normal neurologically. Skin:  Intact without significant lesions or rashes. Psych:  Alert and cooperative. Normal mood and affect.  Intake/Output from previous day: 11/03 0701 - 11/04 0700 In: 1000 [IV Piggyback:1000] Out: 600 [Urine:600]  Lab Results: Recent Labs    03/22/21 1116 03/22/21 1740 03/23/21 0250  WBC 8.6 9.2 8.3  HGB 13.0 13.6 12.1*  HCT 39.8 41.1 35.7*  PLT 264 291 246   BMET Recent Labs    03/22/21 0758 03/22/21 1116 03/23/21 0250  NA 139 136 139  K 3.8 3.8 4.0  CL 106 107 108  CO2 25 25 23   GLUCOSE 98 149* 100*  BUN 34* 38*  29*  CREATININE 0.87 0.94 0.69  CALCIUM 9.3 8.9 8.9   LFT Recent Labs    03/23/21 0250  PROT 5.9*  ALBUMIN 3.6  AST 17  ALT 20  ALKPHOS 49  BILITOT 1.2   PT/INR Recent Labs    03/23/21 0250  LABPROT 13.9  INR 1.1   Studies/Results: CT Angio Abd/Pel W and/or Wo Contrast  Result Date: 03/22/2021 CLINICAL DATA:  Gastrointestinal bleed. Right lower quadrant pain and nausea. EXAM: CTA ABDOMEN AND PELVIS WITHOUT AND WITH CONTRAST TECHNIQUE: Multidetector CT imaging of the abdomen and pelvis was performed using the standard protocol during bolus administration of intravenous contrast. Multiplanar reconstructed images and MIPs were obtained and reviewed to evaluate the vascular anatomy. CONTRAST:  167mL OMNIPAQUE IOHEXOL 350 MG/ML SOLN COMPARISON:  None. FINDINGS: VASCULAR No extravasation of intravenous contrast noted. Aorta: Mild to moderate atherosclerotic plaque. Normal caliber aorta without aneurysm, dissection, vasculitis or significant stenosis. Celiac: Patent without evidence of aneurysm, dissection, vasculitis or significant stenosis. SMA: Patent without evidence of aneurysm, dissection, vasculitis or significant stenosis. Renals: Mild atherosclerotic plaque. Both renal arteries are patent without evidence of aneurysm, dissection, vasculitis, fibromuscular dysplasia or significant stenosis. IMA: Patent without evidence of aneurysm, dissection, vasculitis or significant stenosis. Inflow: Patent without evidence of aneurysm, dissection, vasculitis or significant stenosis. Proximal Outflow: Bilateral common femoral and visualized portions of the superficial and profunda femoral arteries are patent without evidence of aneurysm, dissection, vasculitis or significant stenosis. Veins: The main portal, splenic, superior mesenteric veins are patent. Review of the MIP images confirms the above findings. NON-VASCULAR Lower chest: No acute abnormality. Hepatobiliary: No focal liver abnormality. No  gallstones, gallbladder wall thickening, or pericholecystic fluid. Pneumobilia noted. Common bile duct stent noted with proximal tip within the mid common bile duct just past the pancreas. Distal tip noted within the 2nd/3rd portion of the duodenum. No intra or extrahepatic biliary ductal dilatation. Pancreas: Pancreatic sten noted with proximal tip within the proximal pancreas and distal tip within the 2nd/3rd portion of the duodenum. No focal lesion. Normal pancreatic contour. No surrounding inflammatory changes. No main pancreatic  ductal dilatation. Spleen: Normal in size without focal abnormality. Adrenals/Urinary Tract: No adrenal nodule bilaterally. Bilateral kidneys enhance symmetrically. No hydronephrosis. No hydroureter. The urinary bladder is unremarkable. Stomach/Bowel: Stomach is within normal limits. No evidence of bowel wall thickening or dilatation. No pneumatosis. Appendix appears normal in caliber with no inflammatory changes within the right lower quadrant. Appendicolith is noted. Lymphatic: No lymphadenopathy. Reproductive: The prostate is enlarged measuring up to 5.1 cm. Other: No intraperitoneal free fluid. No intraperitoneal free gas. No organized fluid collection. Musculoskeletal: No abdominal wall hernia or abnormality. No suspicious lytic or blastic osseous lesions. No acute displaced fracture. Severe multilevel degenerative changes of the spine. IMPRESSION: VASCULAR 1. No CT evidence of active gastrointestinal bleed. 2. No acute intra-abdominal or intrapelvic abnormality. 3.  Aortic Atherosclerosis (ICD10-I70.0). NON-VASCULAR 1. No acute intra-abdominal or intrapelvic abnormality. 2. Common bile duct stent with proximal tip within the mid common bile duct just past the pancreas. Distal tip within the 2nd/3rd portion of the duodenum. Associated pneumobilia suggestive of patency. 3. Pancreatic stent with proximal tip within the proximal pancreas and distal tip within the 2nd/3rd portion of  the duodenum. Electronically Signed   By: Iven Finn M.D.   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  Now with black stools, elevated BUN, and drop in Hgb from baseline.  Suspect bleeding from ampullectomy site. *Atrial fibrillation on Eliquis at home but has not yet resumed it post procedure.  PLAN: -IV PPI BID. -Monitor Hgb and transfuse prn. -EGD with possible intervention later today with Dr. Fuller Plan.  [name]  03/23/2021, 9:26 AM    Attending Physician Note   I have taken a history, reviewed the chart and examined the patient. I personally saw the patient and performed a substantive portion of this encounter, including a complete performance of at least one of the key components, in conjunction with the APP. I agree with the APP's note, impression and recommendations.   Acute UGI bleed presenting with melena S/P ampullectomy on Monday for an ampullary adenoma. Biliary and pancreatic duct stents were placed. CTA was negative for active bleeding and showed both stents in place and in good position. EGD with duodenoscope today. If bleeding site cannot be identified or controlled will need ERCP next. Continue PPI IV bid. Continue to hold Eliquis. Trend CBC.   Lucio Edward, MD [facility] See [facility], [facility], for our on call provider

## 2021-03-23 NOTE — Progress Notes (Signed)
PROGRESS NOTE    [name]  XFG:[id number] DOB: [birth year] DOA: 03/22/2021 PCP: Biagio Borg, MD   Brief Narrative:  75 year old with history of P A. fib on Eliquis, CAD, ischemic cardiomyopathy status post [employer]tr[employer] CRT, systolic CHF EF 96%, OSA on CPAP, HTN, HLD, RA, GERD sent to the hospital ED from GI clinic.  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  Temporary plastic stent was placed but since then he has had dark stools.  He has not been on Eliquis since his procedure and was advised to come to the hospital for further evaluation.  GI team was consulted.   Assessment & Plan:   Principal Problem:   Acute upper GI bleed Active Problems:   Hyperlipidemia   OSA on CPAP   Essential hypertension   Coronary artery disease involving native coronary artery of native heart without angina pectoris   Gastroesophageal reflux disease   Benign prostatic hyperplasia with urinary obstruction   Chronic systolic heart failure (HCC)   Rheumatoid arthritis (HCC)   AF (paroxysmal atrial fibrillation) (HCC)  Acute upper GI bleed - Melanotic stool for the past 3 days, overall hemoglobin is stable.  Underwent EUS/ERCP as outpatient on 10/31-had sphincterotomy at that time with stent placement.  He has been off Eliquis since then - Continue PPI IV twice daily - Serial CBC.  GI following, plans for EGD today -CTA of the abdomen-negative for active bleeding  Paroxysmal atrial fibrillation - Currently rate controlled.  Anticoagulation on hold due to active bleed.  Continue Coreg  Coronary artery disease with history of ischemic cardiomyopathy - Currently chest pain-free on telemetry.  Eliquis is on hold.  Continue Coreg, statin  Congestive heart failure with reduced ejection fraction, 55% - Clinically appears to be euvolemic.  Currently on Coreg, Entresto, Aldactone  Essential  hypertension - On Coreg, Entresto and Aldactone  History of rheumatoid arthritis - On Plaquenil  Hyperlipidemia - Statin  BPH - Flomax  OSA -CPAP      DVT prophylaxis: SCDs Start: 03/22/21 2356 Code Status: DNR Family Communication: Wife is at the bedside  Maintain hospital stay as patient is undergoing GI bleed evaluation.  Nutritional status           Body mass index is 29.76 kg/m.           Subjective: Feeling okay no complaints this morning.  Has not had a bowel movement yet so unsure if he still having melanotic stools.  Review of Systems Otherwise negative except as per HPI, including: General: Denies fever, chills, night sweats or unintended weight loss. Resp: Denies cough, wheezing, shortness of breath. Cardiac: Denies chest pain, palpitations, orthopnea, paroxysmal nocturnal dyspnea. GI: Denies abdominal pain, nausea, vomiting, diarrhea or constipation GU: Denies dysuria, frequency, hesitancy or incontinence MS: Denies muscle aches, joint pain or swelling Neuro: Denies headache, neurologic deficits (focal weakness, numbness, tingling), abnormal gait Psych: Denies anxiety, depression, SI/HI/AVH Skin: Denies new rashes or lesions ID: Denies sick contacts, exotic exposures, travel  Examination:  General exam: Appears calm and comfortable  Respiratory system: Clear to auscultation. Respiratory effort normal. Cardiovascular system: S1 & S2 heard, RRR. No JVD, murmurs, rubs, gallops or clicks. No pedal edema. Gastrointestinal system: Abdomen is nondistended, soft and nontender. No organomegaly or masses felt. Normal bowel sounds heard. Central nervous system: Alert and oriented. No focal neurological deficits. Extremities: Symmetric 5 x 5 power. Skin: No rashes, lesions or  ulcers Psychiatry: Judgement and insight appear normal. Mood & affect appropriate.     Objective: Vitals:   03/23/21 0500 03/23/21 0600 03/23/21 0700 03/23/21 0804  BP:  128/66 116/72 111/72 122/72  Pulse: 84 87 99 83  Resp: 19 20 20 20   Temp:    98.1 F (36.7 C)  TempSrc:    Oral  SpO2: 94% 95% 94% 98%  Weight:      Height:        Intake/Output Summary (Last 24 hours) at 03/23/2021 0821 Last data filed at 03/23/2021 0259 Gross per 24 hour  Intake 1000 ml  Output 600 ml  Net 400 ml   Filed Weights   03/22/21 1105  Weight: 86.2 kg     Data Reviewed:   CBC: Recent Labs  Lab 03/22/21 0758 03/22/21 1116 03/22/21 1740 03/23/21 0250  WBC 9.5 8.6 9.2 8.3  NEUTROABS  --   --  5.7  --   HGB 13.2 13.0 13.6 12.1*  HCT 39.8 39.8 41.1 35.7*  MCV 89.1 91.5 90.5 88.8  PLT 284.0 264 291 254   Basic Metabolic Panel: Recent Labs  Lab 03/22/21 0758 03/22/21 1116 03/23/21 0250  NA 139 136 139  K 3.8 3.8 4.0  CL 106 107 108  CO2 25 25 23   GLUCOSE 98 149* 100*  BUN 34* 38* 29*  CREATININE 0.87 0.94 0.69  CALCIUM 9.3 8.9 8.9  MG  --   --  2.3   GFR: Estimated Creatinine Clearance: 81 mL/min (by C-G formula based on SCr of 0.69 mg/dL). Liver Function Tests: Recent Labs  Lab 03/22/21 0758 03/22/21 1116 03/23/21 0250  AST 16 17 17   ALT 17 19 20   ALKPHOS 53 52 49  BILITOT 1.2 1.4* 1.2  PROT 6.6 6.3* 5.9*  ALBUMIN 4.2 3.9 3.6   Recent Labs  Lab 03/22/21 0758  LIPASE 22.0  AMYLASE 23*   No results for input(s): AMMONIA in the last 168 hours. Coagulation Profile: Recent Labs  Lab 03/23/21 0250  INR 1.1   Cardiac Enzymes: No results for input(s): CKTOTAL, CKMB, CKMBINDEX, TROPONINI in the last 168 hours. BNP (last 3 results) No results for input(s): PROBNP in the last 8760 hours. HbA1C: No results for input(s): HGBA1C in the last 72 hours. CBG: No results for input(s): GLUCAP in the last 168 hours. Lipid Profile: No results for input(s): CHOL, HDL, LDLCALC, TRIG, CHOLHDL, LDLDIRECT in the last 72 hours. Thyroid Function Tests: No results for input(s): TSH, T4TOTAL, FREET4, T3FREE, THYROIDAB in the last 72 hours. Anemia  Panel: No results for input(s): VITAMINB12, FOLATE, FERRITIN, TIBC, IRON, RETICCTPCT in the last 72 hours. Sepsis Labs: No results for input(s): PROCALCITON, LATICACIDVEN in the last 168 hours.  Recent Results (from the past 240 hour(s))  Resp Panel by RT-PCR (Flu A&B, Covid) Nasopharyngeal Swab     Status: None   Collection Time: 03/22/21  7:39 PM   Specimen: Nasopharyngeal Swab; Nasopharyngeal(NP) swabs in vial transport medium  Result Value Ref Range Status   SARS Coronavirus 2 by RT PCR NEGATIVE NEGATIVE Final    Comment: (NOTE) SARS-CoV-2 target nucleic acids are NOT DETECTED.  The SARS-CoV-2 RNA is generally detectable in upper respiratory specimens during the acute phase of infection. The lowest concentration of SARS-CoV-2 viral copies this assay can detect is 138 copies/mL. A negative result does not preclude SARS-Cov-2 infection and should not be used as the sole basis for treatment or other patient management decisions. A negative result may occur with  improper specimen collection/handling, submission of specimen other than nasopharyngeal swab, presence of viral mutation(s) within the areas targeted by this assay, and inadequate number of viral copies(<138 copies/mL). A negative result must be combined with clinical observations, patient history, and epidemiological information. The expected result is Negative.  Fact Sheet for Patients:  EntrepreneurPulse.com.au  Fact Sheet for Healthcare Providers:  IncredibleEmployment.be  This test is no t yet approved or cleared by the [place] FDA and  has been authorized for detection and/or diagnosis of SARS-CoV-2 by FDA under an Emergency Use Authorization (EUA). This EUA will remain  in effect (meaning this test can be used) for the duration of the COVID-19 declaration under Section 564(b)(1) of the Act, 21 U.S.C.section 360bbb-3(b)(1), unless the authorization is terminated  or  revoked sooner.       Influenza A by PCR NEGATIVE NEGATIVE Final   Influenza B by PCR NEGATIVE NEGATIVE Final    Comment: (NOTE) The Xpert Xpress SARS-CoV-2/FLU/RSV plus assay is intended as an aid in the diagnosis of influenza from Nasopharyngeal swab specimens and should not be used as a sole basis for treatment. Nasal washings and aspirates are unacceptable for Xpert Xpress SARS-CoV-2/FLU/RSV testing.  Fact Sheet for Patients: EntrepreneurPulse.com.au  Fact Sheet for Healthcare Providers: IncredibleEmployment.be  This test is not yet approved or cleared by the [place] FDA and has been authorized for detection and/or diagnosis of SARS-CoV-2 by FDA under an Emergency Use Authorization (EUA). This EUA will remain in effect (meaning this test can be used) for the duration of the COVID-19 declaration under Section 564(b)(1) of the Act, 21 U.S.C. section 360bbb-3(b)(1), unless the authorization is terminated or revoked.  Performed at [facility], [place] [address], [place], [place] [address]          Radiology Studies: CT Angio Abd/Pel W and/or Wo Contrast  Result Date: 03/22/2021 CLINICAL DATA:  Gastrointestinal bleed. Right lower quadrant pain and nausea. EXAM: CTA ABDOMEN AND PELVIS WITHOUT AND WITH CONTRAST TECHNIQUE: Multidetector CT imaging of the abdomen and pelvis was performed using the standard protocol during bolus administration of intravenous contrast. Multiplanar reconstructed images and MIPs were obtained and reviewed to evaluate the vascular anatomy. CONTRAST:  152mL OMNIPAQUE IOHEXOL 350 MG/ML SOLN COMPARISON:  None. FINDINGS: VASCULAR No extravasation of intravenous contrast noted. Aorta: Mild to moderate atherosclerotic plaque. Normal caliber aorta without aneurysm, dissection, vasculitis or significant stenosis. Celiac: Patent without evidence of aneurysm, dissection, vasculitis or significant stenosis.  SMA: Patent without evidence of aneurysm, dissection, vasculitis or significant stenosis. Renals: Mild atherosclerotic plaque. Both renal arteries are patent without evidence of aneurysm, dissection, vasculitis, fibromuscular dysplasia or significant stenosis. IMA: Patent without evidence of aneurysm, dissection, vasculitis or significant stenosis. Inflow: Patent without evidence of aneurysm, dissection, vasculitis or significant stenosis. Proximal Outflow: Bilateral common femoral and visualized portions of the superficial and profunda femoral arteries are patent without evidence of aneurysm, dissection, vasculitis or significant stenosis. Veins: The main portal, splenic, superior mesenteric veins are patent. Review of the MIP images confirms the above findings. NON-VASCULAR Lower chest: No acute abnormality. Hepatobiliary: No focal liver abnormality. No gallstones, gallbladder wall thickening, or pericholecystic fluid. Pneumobilia noted. Common bile duct stent noted with proximal tip within the mid common bile duct just past the pancreas. Distal tip noted within the 2nd/3rd portion of the duodenum. No intra or extrahepatic biliary ductal dilatation. Pancreas: Pancreatic sten noted with proximal tip within the proximal pancreas and distal tip within the 2nd/3rd portion of the duodenum. No focal  lesion. Normal pancreatic contour. No surrounding inflammatory changes. No main pancreatic ductal dilatation. Spleen: Normal in size without focal abnormality. Adrenals/Urinary Tract: No adrenal nodule bilaterally. Bilateral kidneys enhance symmetrically. No hydronephrosis. No hydroureter. The urinary bladder is unremarkable. Stomach/Bowel: Stomach is within normal limits. No evidence of bowel wall thickening or dilatation. No pneumatosis. Appendix appears normal in caliber with no inflammatory changes within the right lower quadrant. Appendicolith is noted. Lymphatic: No lymphadenopathy. Reproductive: The prostate is  enlarged measuring up to 5.1 cm. Other: No intraperitoneal free fluid. No intraperitoneal free gas. No organized fluid collection. Musculoskeletal: No abdominal wall hernia or abnormality. No suspicious lytic or blastic osseous lesions. No acute displaced fracture. Severe multilevel degenerative changes of the spine. IMPRESSION: VASCULAR 1. No CT evidence of active gastrointestinal bleed. 2. No acute intra-abdominal or intrapelvic abnormality. 3.  Aortic Atherosclerosis (ICD10-I70.0). NON-VASCULAR 1. No acute intra-abdominal or intrapelvic abnormality. 2. Common bile duct stent with proximal tip within the mid common bile duct just past the pancreas. Distal tip within the 2nd/3rd portion of the duodenum. Associated pneumobilia suggestive of patency. 3. Pancreatic stent with proximal tip within the proximal pancreas and distal tip within the 2nd/3rd portion of the duodenum. Electronically Signed   By: Iven Finn M.D.   On: 03/22/2021 19:20        Scheduled Meds:  atorvastatin  20 mg Oral Daily   carvedilol  25 mg Oral BID WC   cycloSPORINE  1 drop Both Eyes BID   folic acid  748 mcg Oral Daily   hydroxychloroquine  200 mg Oral Once per day on Mon Tue Wed Thu Fri   pantoprazole (PROTONIX) IV  40 mg Intravenous Q12H   sacubitril-valsartan  1 tablet Oral BID   spironolactone  12.5 mg Oral Daily   sucralfate  1 g Oral TID WC & HS   tamsulosin  0.4 mg Oral Daily   Continuous Infusions:   LOS: 0 days   Time spent= 35 mins    Stuart Guillen Arsenio Loader, MD [facility]  If 7PM-7AM, please contact night-coverage  03/23/2021, 8:21 AM

## 2021-03-23 NOTE — Consult Note (Addendum)
Referring Provider:  EDP Primary Care Physician:  Biagio Borg, MD Primary Gastroenterologist:  Dr.   Luiz Iron for Consultation:  GI bleed  HPI: [name] is a 75 y.o. male past medical history of paroxysmal atrial fibrillation on Eliquis, coronary artery disease  (S/P RCA PCI in 2005, last cath 01/2019 with nonobstructive disease), ischemic cardiomyopathy S/P Medtronic CRT-D 27/0350 with systolic congestive heart failure (Echo 06/2020 EF 50-55%), LBBB, obstructive sleep apnea on CPAP, hypertension, hyperlipidemia, rheumatoid arthritis, and gastroesophageal reflux disease who presents to [facility] ED with complaints of black stool.  He had EUS and ERCP with Dr. Rush Landmark on 10/31 with ampullectomy for ampullary adenoma.  Had stents placed as well as below.  Says that Tuesday night he started noticing the black stools and really noticed it Wednesday morning but kept hoping that it would stop.  Thursday morning had another jet black stool so was instructed to come to the ED per our office.    BUN elevated at 38.  Hgb at 12.1 grams this AM down from about 14 gram baseline.  CT angio showed the following:  IMPRESSION: VASCULAR   1. No CT evidence of active gastrointestinal bleed. 2. No acute intra-abdominal or intrapelvic abnormality. 3.  Aortic Atherosclerosis (ICD10-I70.0).   NON-VASCULAR   1. No acute intra-abdominal or intrapelvic abnormality. 2. Common bile duct stent with proximal tip within the mid common bile duct just past the pancreas. Distal tip within the 2nd/3rd portion of the duodenum. Associated pneumobilia suggestive of patency. 3. Pancreatic stent with proximal tip within the proximal pancreas and distal tip within the 2nd/3rd portion of the duodenum.   Says that he's had some nausea too but no vomiting.  A little soreness in upper abdomen.  A lot of gas.  Had two BMs that were really black while in the ED.  None so far this AM.   ERCP on 10/31 as follow:  - Ampullary  lesion noted as on EUS. - A biliary sphincterotomy was performed. - A pancreatic sphincterotomy was performed. - The major papilla was successfully injected. Snare endoscopic mucosal papillectomy of the major papilla was performed. Resection and retrieval were complete. - One temporary plastic pancreatic stent was placed into the ventral pancreatic duct. - One plastic biliary stent was placed into the common bile duct.  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  Past Medical History:  Diagnosis Date   AICD (automatic cardioverter/defibrillator) present    Allergic rhinitis    Allergy    Anginal pain ([place]) 05/26/14   chest pain after chasing dog   Atypical nevus of back 04/27/2003   moderate - mid lower back   Basal cell carcinoma 01/26/2008   right cheek - MOHs   CAD (coronary artery disease)    hx of stent- 2005 RCA   Cataract    beginning stage both eyes   CHF (congestive heart failure) (HCC)    pacemaker Medtronic     Chronic back pain    intermittent   Constipation    Dizziness    Dysrhythmia    right bundle branch block    Esophageal stricture    GERD (gastroesophageal reflux disease)    Hemoptysis    Hiatal hernia    History of colonic polyps    hyperplastic   HTN (hypertension)    Hyperlipidemia    Hypertrophy of prostate with urinary obstruction and other lower urinary tract symptoms (LUTS)    OA (osteoarthritis)    OSA (obstructive  sleep apnea)    cpap- 10    Other specified disorder of stomach and duodenum    duodenal periampulary tubulovillous adenoma removed by Dr. Ardis Hughs 5/10   Pericarditis 07/06/2019   Pneumonia    Pre-diabetes    no medications   Shortness of breath    with exertion   Sleep apnea    cpap   Testicular hypofunction     Past Surgical History:  Procedure Laterality Date   BILIARY STENT PLACEMENT N/A 03/19/2021   Procedure: BILIARY STENT PLACEMENT;  Surgeon: Irving Copas., MD;  Location: [facility];  Service: Gastroenterology;  Laterality: N/A;   BIV ICD INSERTION CRT-D N/A 03/27/2017   Procedure: BIV ICD INSERTION CRT-D;  Surgeon: Constance Haw, MD;  Location: [place] CV LAB;  Service: Cardiovascular;  Laterality: N/A;   CARDIAC CATHETERIZATION     '05, last 2009, showing patent RCA stent   COLONOSCOPY  12/2007   HYPERPLASTIC POLYP   COLONOSCOPY  12/2019   COLONOSCOPY WITH PROPOFOL N/A 06/02/2014   Procedure: COLONOSCOPY WITH PROPOFOL;  Surgeon: [name], MD;  Location: [facility];  Service: Endoscopy;  Laterality: N/A;   CORONARY ANGIOPLASTY  08/2003   ENDOSCOPIC MUCOSAL RESECTION  03/19/2021   Procedure: ENDOSCOPIC MUCOSAL RESECTION, ampullectomy;  Surgeon: Irving Copas., MD;  Location: [facility];  Service: Gastroenterology;;   ENDOSCOPIC RETROGRADE CHOLANGIOPANCREATOGRAPHY (ERCP) WITH PROPOFOL N/A 03/19/2021   Procedure: ENDOSCOPIC RETROGRADE CHOLANGIOPANCREATOGRAPHY (ERCP) WITH PROPOFOL;  Surgeon: Irving Copas., MD;  Location: [facility];  Service: Gastroenterology;  Laterality: N/A;   ESOPHAGOGASTRODUODENOSCOPY (EGD) WITH PROPOFOL N/A 06/02/2014   Procedure: ESOPHAGOGASTRODUODENOSCOPY (EGD) WITH PROPOFOL;  Surgeon: [name], MD;  [facility];  Service: Endoscopy;  Laterality: N/A;   ESOPHAGOGASTRODUODENOSCOPY (EGD) WITH PROPOFOL N/A 03/19/2021   Procedure: ESOPHAGOGASTRODUODENOSCOPY (EGD) WITH PROPOFOL;  Surgeon: Rush Landmark Telford Nab., MD;  [facility];  Service: Gastroenterology;  Laterality: N/A;   hernia surgery x 3     Bilateral Inguinal, Umbicial   INSERTION OF MESH N/A 03/20/2018   Procedure: INSERTION OF MESH;  Surgeon: Alphonsa Overall, MD;  Location: [place];  Service: General;  Laterality: N/A;   IRRIGATION AND DEBRIDEMENT ABSCESS Left 08/14/2012   Procedure: IRRIGATION AND DEBRIDEMENT LEFT INGUINAL BOIL ;  Surgeon: Ailene Rud, MD;  [facility];  Service: Urology;  Laterality: Left;    JOINT REPLACEMENT Right 2017   KNEE ARTHROPLASTY     LEFT HEART CATH AND CORONARY ANGIOGRAPHY N/A 01/24/2019   Procedure: LEFT HEART CATH AND CORONARY ANGIOGRAPHY;  Surgeon: Martinique, Peter M, MD;  Location: [place] [facility];  Service: Cardiovascular;  Laterality: N/A;   LEFT HEART CATHETERIZATION WITH CORONARY ANGIOGRAM N/A 05/26/2014   Procedure: LEFT HEART CATHETERIZATION WITH CORONARY ANGIOGRAM;  Surgeon: Blane Ohara, MD;  Location: [facility] CATH LAB;  Service: Cardiovascular;  Laterality: N/A;   LUMBAR LAMINECTOMY/DECOMPRESSION MICRODISCECTOMY Left 09/26/2014   Procedure: Lumbar Laminectomy for resection of synovial cyst  Lumbar five- sacral one left;  Surgeon: Kary Kos, MD;  Location: [place] [facility]URO ORS;  Service: Neurosurgery;  Laterality: Left;   MOLE REMOVAL Left 03/20/2018   Procedure: MOLE REMOVAL;  Surgeon: Alphonsa Overall, MD;  Location: [place];  Service: General;  Laterality: Left;   Oral surg to removed growth from ?sinus  10/2010   ?Dermoid removed by DrRiggs   PACEMAKER INSERTION  04/03/2017   PANCREATIC STENT PLACEMENT  03/19/2021   Procedure: PANCREATIC STENT PLACEMENT;  Surgeon: Irving Copas., MD;  Location: [facility];  Service: Gastroenterology;;   POLYPECTOMY     RCA stenting     '05 RCA   right toe surgery  Right    Cyst    RIGHT/LEFT HEART CATH AND CORONARY ANGIOGRAPHY N/A 01/15/2017   Procedure: RIGHT/LEFT HEART CATH AND CORONARY ANGIOGRAPHY;  Surgeon: [name], MD;  Location: [place] CV LAB;  Service: Cardiovascular;  Laterality: N/A;   s/p right knee arthroscopy  2005   SPHINCTEROTOMY  03/19/2021   Procedure: SPHINCTEROTOMY;  Surgeon: Mansouraty, Telford Nab., MD;  Location: [facility];  Service: Gastroenterology;;   [place] INJECTION  03/19/2021   Procedure: SUBMUCOSAL LIFTING INJECTION;  Surgeon: Irving Copas., MD;  Location: [facility];  Service: Gastroenterology;;   thumb surgery Right    TOTAL KNEE ARTHROPLASTY Right  03/12/2016   Procedure: RIGHT TOTAL KNEE ARTHROPLASTY;  Surgeon: Paralee Cancel, MD;  [facility];  Service: Orthopedics;  Laterality: Right;   UPPER ESOPHAGEAL ENDOSCOPIC ULTRASOUND (EUS) N/A 03/19/2021   Procedure: UPPER ESOPHAGEAL ENDOSCOPIC ULTRASOUND (EUS);  Surgeon: Irving Copas., MD;  Location: [facility];  Service: Gastroenterology;  Laterality: N/A;   UPPER GASTROINTESTINAL ENDOSCOPY     VENTRAL HERNIA REPAIR N/A 03/20/2018   Procedure: [place];  Surgeon: Alphonsa Overall, MD;  Location: [place];  Service: General;  Laterality: N/A;    Prior to Admission medications   Medication Sig Start Date End Date Taking? Authorizing Provider  acetaminophen (TYLENOL) 650 MG CR tablet Take 650 mg by mouth in the morning and at bedtime.   Yes [provider]  apixaban (ELIQUIS) 5 MG TABS tablet Take 1 tablet (5 mg total) by mouth 2 (two) times daily. 03/22/21  Yes Mansouraty, Telford Nab., MD  atorvastatin (LIPITOR) 20 MG tablet TAKE 1 TABLET BY MOUTH DAILY Patient taking differently: Take 20 mg by mouth daily. 05/24/20  Yes [name], MD  b complex vitamins tablet Take 1 tablet by mouth daily with lunch.   Yes [provider]  carvedilol (COREG) 25 MG tablet TAKE 1 TABLET BY MOUTH 2 TIMES DAILY. GENERIC EQUIVALENT FOR COREG. Patient taking differently: Take 25 mg by mouth in the morning and at bedtime. 02/28/21  Yes [name], MD  cholecalciferol (VITAMIN D3) 25 MCG (1000 UNIT) tablet Take 1,000 Units by mouth daily.   Yes [provider]  Coenzyme Q10 (CO Q 10 PO) Take 200 mg by mouth daily with lunch.   Yes [provider]  diclofenac sodium (VOLTAREN) 1 % GEL Apply 1 application topically 4 (four) times daily as needed (pain).  10/02/16  Yes [provider]  ENTRESTO 49-51 MG TAKE 1 TABLET BY MOUTH TWICE DAILY Patient taking differently: Take 1 tablet by mouth 2 (two) times daily. 09/18/20   Yes Weaver, Scott T, PA-C  Flaxseed, Linseed, (FLAXSEED OIL PO) Take 1 capsule by mouth daily. With Omega 3   Yes [provider]  folic acid (FOLVITE) 341 MCG tablet Take 400 mcg by mouth daily.   Yes [provider]  Glucosamine HCl (GLUCOSAMINE PO) Take 1,500 mg by mouth every evening.   Yes [provider]  hydrocortisone 2.5 % cream Apply 1 application topically 2 (two) times daily as needed (rash on nose).  01/14/19  Yes [provider]  hydroxychloroquine (PLAQUENIL) 200 MG tablet TAKE 1 TABLET(200 MG) BY MOUTH DAILY Patient taking differently: Take 200 mg by mouth See admin instructions. Mon thru Fri only 02/02/21  Yes Ofilia Neas, PA-C  Lutein 20 MG TABS Take 20 mg by mouth daily.   Yes [provider]  Multiple Vitamin (MULTIVITAMIN) capsule Take 1 capsule by mouth daily.     Yes [provider]  pantoprazole (PROTONIX) 40 MG tablet Take 1 tablet (40 mg total) by mouth 2 (two) times daily before a meal. TAKE 1 TABLET BY MOUTH EVERY DAY BEFORE BREAKFAST 03/19/21  Yes Mansouraty, Telford Nab., MD  Probiotic Product (PROBIOTIC DAILY PO) Take 1 capsule by mouth daily with lunch.    Yes [provider]  RESTASIS 0.05 % ophthalmic emulsion Place 1 drop into both eyes 2 (two) times daily. 01/12/21  Yes [provider]  Saw Palmetto, Serenoa repens, (SAW PALMETTO PO) Take 450 mg by mouth every evening.   Yes [provider]  spironolactone (ALDACTONE) 25 MG tablet TAKE 1/2 TABLET(12.5 MG) BY MOUTH DAILY Patient taking differently: Take 12.5 mg by mouth daily. 11/23/20  Yes [name], MD  sucralfate (CARAFATE) 1 g tablet Take 1 tablet (1 g total) by mouth 2 (two) times daily. 03/19/21  Yes Mansouraty, Telford Nab., MD  tamsulosin (FLOMAX) 0.4 MG CAPS capsule Take 0.4 mg by mouth daily.   Yes [provider]  Testosterone 20 % CREA Apply 2 mLs topically daily. Rub on shoulder   Yes [provider]   traMADol (ULTRAM) 50 MG tablet TAKE 1 TABLET(50 MG) BY MOUTH AT BEDTIME AS NEEDED Patient taking differently: Take 50 mg by mouth at bedtime. 03/14/21  Yes Deveshwar, Abel Presto, MD  TURMERIC PO Take 1 tablet by mouth daily with lunch.    Yes [provider]  vitamin C (ASCORBIC ACID) 500 MG tablet Take 500 mg by mouth daily.   Yes [provider]  zinc gluconate 50 MG tablet Take 50 mg by mouth daily.   Yes [provider]  nitroGLYCERIN (NITROSTAT) 0.4 MG SL tablet Place 1 tablet (0.4 mg total) under the tongue every 5 (five) minutes x 3 doses as needed for chest pain. 01/25/19   Tommie Raymond, NP  NON FORMULARY CPAP machine with sleep.    [provider]    Current Facility-Administered Medications  Medication Dose Route Frequency Provider Last Rate Last Admin   acetaminophen (TYLENOL) tablet 650 mg  650 mg Oral Q6H PRN Shalhoub, Sherryll Burger, MD       Or   acetaminophen (TYLENOL) suppository 650 mg  650 mg Rectal Q6H PRN Shalhoub, Sherryll Burger, MD       atorvastatin (LIPITOR) tablet 20 mg  20 mg Oral Daily Shalhoub, Sherryll Burger, MD       carvedilol (COREG) tablet 25 mg  25 mg Oral BID WC Shalhoub, Sherryll Burger, MD       cycloSPORINE (RESTASIS) 0.05 % ophthalmic emulsion 1 drop  1 drop Both Eyes BID Shalhoub, Sherryll Burger, MD       folic acid (FOLVITE) tablet 0.5 mg  500 mcg Oral Daily Shalhoub, Sherryll Burger, MD       hydrALAZINE (APRESOLINE) injection 10 mg  10 mg Intravenous Q6H PRN Shalhoub, Sherryll Burger, MD       hydroxychloroquine (PLAQUENIL) tablet 200 mg  200 mg Oral Once per day on Mon Tue Wed Thu Fri Vernelle Emerald, MD       ondansetron [facility]) tablet 4 mg  4 mg Oral Q6H PRN Vernelle Emerald, MD       Or   ondansetron [facility]) injection 4 mg  4 mg Intravenous Q6H PRN Shalhoub, Sherryll Burger,  MD       pantoprazole (PROTONIX) injection 40 mg  40 mg Intravenous Q12H Shalhoub, Sherryll Burger, MD   40 mg at 03/23/21 0028   polyethylene glycol (MIRALAX / GLYCOLAX) packet 17 g  17 g  Oral Daily PRN Shalhoub, Sherryll Burger, MD       sacubitril-valsartan (ENTRESTO) 49-51 mg per tablet  1 tablet Oral BID Shalhoub, Sherryll Burger, MD       spironolactone (ALDACTONE) tablet 12.5 mg  12.5 mg Oral Daily Shalhoub, Sherryll Burger, MD       sucralfate (CARAFATE) tablet 1 g  1 g Oral TID WC & HS Shalhoub, Sherryll Burger, MD       tamsulosin (FLOMAX) capsule 0.4 mg  0.4 mg Oral Daily Shalhoub, Sherryll Burger, MD       traMADol Veatrice Bourbon) tablet 50 mg  50 mg Oral QHS PRN Shalhoub, Sherryll Burger, MD        Allergies as of 03/22/2021 - Review Complete 03/22/2021  Allergen Reaction Noted   Sulfonamide derivatives Rash 06/26/2010    Family History  Problem Relation Age of Onset   Heart disease Mother    Coronary artery disease Father    Diabetes Father    Sudden death Father        due to heart disease   Heart disease Father    COPD Brother    Arthritis Brother    Heart failure Brother    Cancer Brother        lung    Stomach cancer Paternal Grandmother    Arthritis Daughter    Hypertension Other    Colon cancer Neg Hx    Esophageal cancer Neg Hx    Colon polyps Neg Hx    Ulcerative colitis Neg Hx    Liver disease Neg Hx    Pancreatic cancer Neg Hx    Inflammatory bowel disease Neg Hx    Rectal cancer Neg Hx     Social History   Socioeconomic History   Marital status: Married    Spouse name: Not on file   Number of children: 1   Years of education: Not on file   Highest education level: Not on file  Occupational History   Occupation: Lobbyist: [employer]  Tobacco Use   Smoking status: Former    Types: Cigarettes    Quit date: 05/20/1990    Years since quitting: 30.8   Smokeless tobacco: Never   Tobacco comments:    previous 30 pack year history  Vaping Use   Vaping Use: Never used  Substance and Sexual Activity   Alcohol use: Yes    Comment: rare   Drug use: Never   Sexual activity: Not on file  Other Topics Concern   Not on file  Social History Narrative    Not on file   Social Determinants of Health   Financial Resource Strain: Not on file  Food Insecurity: Not on file  Transportation Needs: Not on file  Physical Activity: Not on file  Stress: Not on file  Social Connections: Not on file  Intimate Partner Violence: Not on file    Review of Systems: ROS is O/W negative except as mentioned in HPI.  Physical Exam: Vital signs in last 24 hours: Temp:  [98.1 F (36.7 C)-98.7 F (37.1 C)] 98.1 F (36.7 C) (11/04 0804) Pulse Rate:  [80-100] 83 (11/04 0804) Resp:  [14-23] 20 (11/04 0804) BP: (102-148)/(57-90) 122/72 (11/04 0804) SpO2:  [93 %-  100 %] 98 % (11/04 0804) Weight:  [86.2 kg] 86.2 kg (11/03 1105)   General:  Alert, Well-developed, well-nourished, pleasant and cooperative in NAD Head:  Normocephalic and atraumatic. Eyes:  Sclera clear, no icterus.  Conjunctiva pink. Ears:  Normal auditory acuity. Mouth:  No deformity or lesions.   Lungs:  Clear throughout to auscultation.  No wheezes, crackles, or rhonchi.  Heart:  Regular rate and rhythm; no murmurs, clicks, rubs,  or gallops. Abdomen:  Soft, non-distended.  BS present.  Minimal epigastric TTP. Rectal:  Deferred.  Black stool, heme positive per EDP.  Msk:  Symmetrical without gross deformities. Pulses:  Normal pulses noted. Extremities:  Without clubbing or edema. Neurologic:  Alert and oriented x 4;  grossly normal neurologically. Skin:  Intact without significant lesions or rashes. Psych:  Alert and cooperative. Normal mood and affect.  Intake/Output from previous day: 11/03 0701 - 11/04 0700 In: 1000 [IV Piggyback:1000] Out: 600 [Urine:600]  Lab Results: Recent Labs    03/22/21 1116 03/22/21 1740 03/23/21 0250  WBC 8.6 9.2 8.3  HGB 13.0 13.6 12.1*  HCT 39.8 41.1 35.7*  PLT 264 291 246   BMET Recent Labs    03/22/21 0758 03/22/21 1116 03/23/21 0250  NA 139 136 139  K 3.8 3.8 4.0  CL 106 107 108  CO2 25 25 23   GLUCOSE 98 149* 100*  BUN 34* 38*  29*  CREATININE 0.87 0.94 0.69  CALCIUM 9.3 8.9 8.9   LFT Recent Labs    03/23/21 0250  PROT 5.9*  ALBUMIN 3.6  AST 17  ALT 20  ALKPHOS 49  BILITOT 1.2   PT/INR Recent Labs    03/23/21 0250  LABPROT 13.9  INR 1.1   Studies/Results: CT Angio Abd/Pel W and/or Wo Contrast  Result Date: 03/22/2021 CLINICAL DATA:  Gastrointestinal bleed. Right lower quadrant pain and nausea. EXAM: CTA ABDOMEN AND PELVIS WITHOUT AND WITH CONTRAST TECHNIQUE: Multidetector CT imaging of the abdomen and pelvis was performed using the standard protocol during bolus administration of intravenous contrast. Multiplanar reconstructed images and MIPs were obtained and reviewed to evaluate the vascular anatomy. CONTRAST:  157mL OMNIPAQUE IOHEXOL 350 MG/ML SOLN COMPARISON:  None. FINDINGS: VASCULAR No extravasation of intravenous contrast noted. Aorta: Mild to moderate atherosclerotic plaque. Normal caliber aorta without aneurysm, dissection, vasculitis or significant stenosis. Celiac: Patent without evidence of aneurysm, dissection, vasculitis or significant stenosis. SMA: Patent without evidence of aneurysm, dissection, vasculitis or significant stenosis. Renals: Mild atherosclerotic plaque. Both renal arteries are patent without evidence of aneurysm, dissection, vasculitis, fibromuscular dysplasia or significant stenosis. IMA: Patent without evidence of aneurysm, dissection, vasculitis or significant stenosis. Inflow: Patent without evidence of aneurysm, dissection, vasculitis or significant stenosis. Proximal Outflow: Bilateral common femoral and visualized portions of the superficial and profunda femoral arteries are patent without evidence of aneurysm, dissection, vasculitis or significant stenosis. Veins: The main portal, splenic, superior mesenteric veins are patent. Review of the MIP images confirms the above findings. NON-VASCULAR Lower chest: No acute abnormality. Hepatobiliary: No focal liver abnormality. No  gallstones, gallbladder wall thickening, or pericholecystic fluid. Pneumobilia noted. Common bile duct stent noted with proximal tip within the mid common bile duct just past the pancreas. Distal tip noted within the 2nd/3rd portion of the duodenum. No intra or extrahepatic biliary ductal dilatation. Pancreas: Pancreatic sten noted with proximal tip within the proximal pancreas and distal tip within the 2nd/3rd portion of the duodenum. No focal lesion. Normal pancreatic contour. No surrounding inflammatory changes. No main pancreatic  ductal dilatation. Spleen: Normal in size without focal abnormality. Adrenals/Urinary Tract: No adrenal nodule bilaterally. Bilateral kidneys enhance symmetrically. No hydronephrosis. No hydroureter. The urinary bladder is unremarkable. Stomach/Bowel: Stomach is within normal limits. No evidence of bowel wall thickening or dilatation. No pneumatosis. Appendix appears normal in caliber with no inflammatory changes within the right lower quadrant. Appendicolith is noted. Lymphatic: No lymphadenopathy. Reproductive: The prostate is enlarged measuring up to 5.1 cm. Other: No intraperitoneal free fluid. No intraperitoneal free gas. No organized fluid collection. Musculoskeletal: No abdominal wall hernia or abnormality. No suspicious lytic or blastic osseous lesions. No acute displaced fracture. Severe multilevel degenerative changes of the spine. IMPRESSION: VASCULAR 1. No CT evidence of active gastrointestinal bleed. 2. No acute intra-abdominal or intrapelvic abnormality. 3.  Aortic Atherosclerosis (ICD10-I70.0). NON-VASCULAR 1. No acute intra-abdominal or intrapelvic abnormality. 2. Common bile duct stent with proximal tip within the mid common bile duct just past the pancreas. Distal tip within the 2nd/3rd portion of the duodenum. Associated pneumobilia suggestive of patency. 3. Pancreatic stent with proximal tip within the proximal pancreas and distal tip within the 2nd/3rd portion of  the duodenum. Electronically Signed   By: Iven Finn M.D.   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  Now with black stools, elevated BUN, and drop in Hgb from baseline.  Suspect bleeding from ampullectomy site. *Atrial fibrillation on Eliquis at home but has not yet resumed it post procedure.  PLAN: -IV PPI BID. -Monitor Hgb and transfuse prn. -EGD with possible intervention later today with Dr. Fuller Plan.  Laban Emperor. Zehr  03/23/2021, 9:26 AM    Attending Physician Note   I have taken a history, reviewed the chart and examined the patient. I personally saw the patient and performed a substantive portion of this encounter, including a complete performance of at least one of the key components, in conjunction with the APP. I agree with the APP's note, impression and recommendations.   Acute UGI bleed presenting with melena S/P ampullectomy on Monday for an ampullary adenoma. Biliary and pancreatic duct stents were placed. CTA was negative for active bleeding and showed both stents in place and in good position. EGD with duodenoscope today. If bleeding site cannot be identified or controlled will need ERCP next. Continue PPI IV bid. Continue to hold Eliquis. Trend CBC.   Lucio Edward, MD [facility] See AMION, [facility], for our on call provider

## 2021-03-23 NOTE — Assessment & Plan Note (Signed)
   Continue outpatient regimen of Plaquenil

## 2021-03-23 NOTE — Assessment & Plan Note (Addendum)
   As noted above, patient is currently on intravenous proton pump inhibitor

## 2021-03-23 NOTE — Assessment & Plan Note (Signed)
   Continue home regimen of Flomax 

## 2021-03-23 NOTE — Interval H&P Note (Signed)
History and Physical Interval Note:  03/23/2021 12:48 PM  [name]  has presented today for surgery, with the diagnosis of GI bleeding.  The various methods of treatment have been discussed with the patient and family. After consideration of risks, benefits and other options for treatment, the patient has consented to  Procedure(s): ESOPHAGOGASTRODUODENOSCOPY (EGD) WITH PROPOFOL (N/A) as a surgical intervention.  The patient's history has been reviewed, patient examined, no change in status, stable for surgery.  I have reviewed the patient's chart and labs.  Questions were answered to the patient's satisfaction.     Pricilla Riffle. Fuller Plan

## 2021-03-23 NOTE — Transfer of Care (Signed)
Immediate Anesthesia Transfer of Care Note  Patient: [name]  Procedure(s) Performed: ESOPHAGOGASTRODUODENOSCOPY (EGD) WITH PROPOFOL  Patient Location: PACU  Anesthesia Type:MAC  Level of Consciousness: awake, alert  and oriented  Airway & Oxygen Therapy: Patient Spontanous Breathing  Post-op Assessment: Report given to RN and Post -op Vital signs reviewed and stable  Post vital signs: Reviewed and stable  Last Vitals:  Vitals Value Taken Time  BP 110/63 03/23/21 1410  Temp    Pulse 92 03/23/21 1414  Resp 21 03/23/21 1414  SpO2 94 % 03/23/21 1414  Vitals shown include unvalidated device data.  Last Pain:  Vitals:   03/23/21 1410  TempSrc:   PainSc: 0-No pain         Complications: No notable events documented.

## 2021-03-23 NOTE — Anesthesia Preprocedure Evaluation (Signed)
Anesthesia Evaluation  Patient identified by MRN, date of birth, ID band Patient awake    Reviewed: Allergy & Precautions, NPO status , Patient's Chart, lab work & pertinent test results  History of Anesthesia Complications (+) DIFFICULT AIRWAY  Airway Mallampati: III  TM Distance: <3 FB Neck ROM: Full    Dental no notable dental hx.    Pulmonary sleep apnea and Continuous Positive Airway Pressure Ventilation , former smoker,    Pulmonary exam normal breath sounds clear to auscultation       Cardiovascular hypertension, + CAD, + Cardiac Stents and +CHF  Normal cardiovascular exam+ dysrhythmias + Cardiac Defibrillator  Rhythm:Regular Rate:Normal     Neuro/Psych negative neurological ROS  negative psych ROS   GI/Hepatic Neg liver ROS, GERD  ,  Endo/Other  negative endocrine ROS  Renal/GU negative Renal ROS  negative genitourinary   Musculoskeletal negative musculoskeletal ROS (+)   Abdominal   Peds negative pediatric ROS (+)  Hematology  (+) anemia ,   Anesthesia Other Findings   Reproductive/Obstetrics negative OB ROS                             Anesthesia Physical Anesthesia Plan  ASA: 3  Anesthesia Plan: MAC   Post-op Pain Management:    Induction: Intravenous  PONV Risk Score and Plan: 1 and Propofol infusion and Treatment may vary due to age or medical condition  Airway Management Planned: Simple Face Mask  Additional Equipment:   Intra-op Plan:   Post-operative Plan:   Informed Consent: I have reviewed the patients History and Physical, chart, labs and discussed the procedure including the risks, benefits and alternatives for the proposed anesthesia with the patient or authorized representative who has indicated his/her understanding and acceptance.     Dental advisory given  Plan Discussed with: CRNA and Surgeon  Anesthesia Plan Comments:          Anesthesia Quick Evaluation

## 2021-03-23 NOTE — Assessment & Plan Note (Signed)
.   Continuing home regimen of lipid lowering therapy.  

## 2021-03-23 NOTE — Assessment & Plan Note (Signed)
.   Patient is currently chest pain free . Monitoring patient on telemetry . Continue home regimen of lipid lowering therapy and AV nodal blocking therapy . Holding any form of anticoagulation due to suspicion for gastrointestinal bleeding

## 2021-03-23 NOTE — Assessment & Plan Note (Signed)
.   Continuing home regimen of CPAP nightly  

## 2021-03-23 NOTE — Op Note (Signed)
[facility] Patient Name: [name] Procedure Date: 03/23/2021 MRN: [id number] Attending MD: Ladene Artist , MD Date of Birth: 26-Sep-1945 CSN: [id number] Age: 75 Admit Type: Inpatient Procedure:                Upper GI endoscopy Indications:              Melena S/P ampullectomy 4 days ago. 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 Fuller Plan, MD, Ervin Knack, Tyna Jaksch                            Technician Referring MD:             [facility] Medicines:                Monitored Anesthesia Care Complications:            No immediate complications. Estimated Blood Loss:     Estimated blood loss: none. Procedure:                Pre-Anesthesia Assessment:                           - Prior to the procedure, a History and Physical                            was performed, and patient medications and                            allergies were reviewed. The patient's tolerance of                            previous anesthesia was also reviewed. The risks                            and benefits of the procedure and the sedation                            options and risks were discussed with the patient.                            All questions were answered, and informed consent                            was obtained. Prior Anticoagulants: The patient has                            taken no previous anticoagulant or antiplatelet                            agents. ASA Grade Assessment: III - A patient with  severe systemic disease. After reviewing the risks                            and benefits, the patient was deemed in                            satisfactory condition to undergo the procedure.                           After obtaining informed consent, the endoscope was                            passed under direct vision. Throughout the                             procedure, the patient's blood pressure, pulse, and                            oxygen saturations were monitored continuously. The                            [id number]-Q180V ([id number]) Olympus duodenoscope was                            introduced through the mouth, and advanced to the                            third part of duodenum. The upper GI endoscopy was                            accomplished without difficulty. The patient                            tolerated the procedure well. The esophageal and                            gastric exams were limited with the side viewing                            scope. Scope In: Scope Out: Findings:      One benign-appearing, intrinsic mild stenosis was found at the       gastroesophageal junction. This stenosis measured 1.5 cm (inner       diameter) x less than one cm (in length). The stenosis was traversed.      The exam of the esophagus was otherwise normal.      A small hiatal hernia was present.      Multiple medium sessile polyps with no bleeding and no stigmata of       recent bleeding were found in the gastric fundus and in the gastric body.      The exam of the stomach was otherwise normal.      One non-bleeding superficial duodenal ulcer with a flat pigmented spot       (Forrest Class IIc) was found at ampullectomy site. The lesion was 6 mm  in largest dimension. PD and biliary stents in good position at the site.      The exam of the duodenum was otherwise normal. There was no blood, no       hematin in the UGI tract to D3. Impression:               - Benign-appearing non-obstructive distal                            esophageal stenosis.                           - Small hiatal hernia.                           - Multiple gastric polyps.                           - Non-bleeding duodenal ulcer with a flat pigmented                            spot (Forrest Class IIc) at site of ampullectomy.                           - No blood, no  hematin in the UGI tract to D3.                           - No specimens collected. Moderate Sedation:      Not Applicable - Patient had care per Anesthesia. Recommendation:           - Return patient to hospital ward for ongoing care.                           - Clear liquid diet now and advance to soft diet                            after 1700 today.                           - No aspirin, ibuprofen, naproxen, or other                            non-steroidal anti-inflammatory drugs for 2 weeks.                           - Continue pantoprazole 40 mg IV bid and sucralfate                            1 g po qid (ac & hs).                           - Continue to hold Eliquis for at least 3 more days. Procedure Code(s):        --- Professional ---  26415, Esophagogastroduodenoscopy, flexible,                            transoral; diagnostic, including collection of                            specimen(s) by brushing or washing, when performed                            (separate procedure) Diagnosis Code(s):        --- Professional ---                           K22.2, Esophageal obstruction                           K44.9, Diaphragmatic hernia without obstruction or                            gangrene                           K31.7, Polyp of stomach and duodenum                           K26.9, Duodenal ulcer, unspecified as acute or                            chronic, without hemorrhage or perforation                           K92.1, Melena (includes Hematochezia) CPT copyright 2019 [employer]. All rights reserved. The codes documented in this report are preliminary and upon coder review may  be revised to meet current compliance requirements. Ladene Artist, MD 03/23/2021 2:05:07 PM This report has been signed electronically. Number of Addenda: 0

## 2021-03-23 NOTE — ED Notes (Signed)
Accepting RN Lorriane Shire documented handoff to receive patient. This RN Elgie Collard, RN, if she was ready, she said the room was still being cleaned.

## 2021-03-23 NOTE — Assessment & Plan Note (Signed)
   Patient presenting with 3 days of melena after undergoing EUS/ERCP with sphincterotomies, papillectomies and stent placements  Concern for ongoing upper gastrointestinal bleeding as a complication of the procedure  Patient is been placed on intravenous Protonix 40 mg every 12 hours  ER provider has discussed case with Dr. Hilarie Fredrickson with gastroenterology who recommends that the patient be n.p.o. after midnight for potential endoscopic intervention by Dr. Quinn Axe in the morning  N.p.o. after midnight  Serial CBCs  Type and screen  We will transfuse if hemoglobin drops below 7

## 2021-03-23 NOTE — Assessment & Plan Note (Signed)
•   No clinical evidence of cardiogenic volume overload ° °

## 2021-03-23 NOTE — Assessment & Plan Note (Signed)
   Currently rate controlled.  Holding anticoagulation for now due to concerns for gastrointestinal bleeding  Continue home regimen of rate controlling agent  Monitoring on telemetry

## 2021-03-23 NOTE — Assessment & Plan Note (Signed)
.   Resume patients home regimen of oral antihypertensives . Titrate antihypertensive regimen as necessary to achieve adequate BP control . PRN intravenous antihypertensives for excessively elevated blood pressure   

## 2021-03-24 DIAGNOSIS — K922 Gastrointestinal hemorrhage, unspecified: Secondary | ICD-10-CM | POA: Diagnosis not present

## 2021-03-24 LAB — CBC
HCT: 36.1 % — ABNORMAL LOW (ref 39.0–52.0)
Hemoglobin: 12 g/dL — ABNORMAL LOW (ref 13.0–17.0)
MCH: 30 pg (ref 26.0–34.0)
MCHC: 33.2 g/dL (ref 30.0–36.0)
MCV: 90.3 fL (ref 80.0–100.0)
Platelets: 236 10*3/uL (ref 150–400)
RBC: 4 MIL/uL — ABNORMAL LOW (ref 4.22–5.81)
RDW: 13.1 % (ref 11.5–15.5)
WBC: 8.9 10*3/uL (ref 4.0–10.5)
nRBC: 0 % (ref 0.0–0.2)

## 2021-03-24 LAB — COMPREHENSIVE METABOLIC PANEL
ALT: 20 U/L (ref 0–44)
AST: 17 U/L (ref 15–41)
Albumin: 3.7 g/dL (ref 3.5–5.0)
Alkaline Phosphatase: 55 U/L (ref 38–126)
Anion gap: 7 (ref 5–15)
BUN: 21 mg/dL (ref 8–23)
CO2: 25 mmol/L (ref 22–32)
Calcium: 9.1 mg/dL (ref 8.9–10.3)
Chloride: 106 mmol/L (ref 98–111)
Creatinine, Ser: 0.89 mg/dL (ref 0.61–1.24)
GFR, Estimated: >60 mL/min (ref >60)
Glucose, Bld: 101 mg/dL — ABNORMAL HIGH (ref 70–99)
Potassium: 3.9 mmol/L (ref 3.5–5.1)
Sodium: 138 mmol/L (ref 135–145)
Total Bilirubin: 1.6 mg/dL — ABNORMAL HIGH (ref 0.3–1.2)
Total Protein: 6.1 g/dL — ABNORMAL LOW (ref 6.5–8.1)

## 2021-03-24 LAB — MAGNESIUM: Magnesium: 2.4 mg/dL (ref 1.7–2.4)

## 2021-03-24 MED ORDER — POLYETHYLENE GLYCOL 3350 17 G PO PACK: 17.0000 g | PACK | Freq: Every day | ORAL | 0 refills | Status: AC | PRN

## 2021-03-24 MED ORDER — SUCRALFATE 1 G PO TABS: 1.0000 g | ORAL_TABLET | Freq: Four times a day (QID) | ORAL | 0 refills | Status: DC

## 2021-03-24 MED ORDER — ORAL CARE MOUTH RINSE: 15.0000 mL | Freq: Two times a day (BID) | OROMUCOSAL | Status: DC

## 2021-03-24 NOTE — Progress Notes (Signed)
Progress Note   Subjective  Tolerating soft diet. No bowel movements. Feels well.    Objective  Vital signs in last 24 hours: Temp:  [98 F (36.7 C)-98.3 F (36.8 C)] 98 F (36.7 C) (11/05 0509) Pulse Rate:  [81-93] 93 (11/05 0509) Resp:  [15-26] 18 (11/05 0509) BP: (72-139)/(47-74) 109/58 (11/05 0509) SpO2:  [92 %-99 %] 95 % (11/05 0509) Last BM Date: 03/23/21  General: Alert, well-developed, in NAD Heart:  Regular rate and rhythm; no murmurs Chest: Clear to ascultation bilaterally Abdomen:  Soft, nontender and nondistended. Normal bowel sounds, without guarding, and without rebound.   Extremities:  Without edema. Neurologic:  Alert and  oriented x4; grossly normal neurologically. Psych:  Alert and cooperative. Normal mood and affect.  Intake/Output from previous day: 11/04 0701 - 11/05 0700 In: 640 [P.O.:240; I.V.:400] Out: -  Intake/Output this shift: No intake/output data recorded.  Lab Results: Recent Labs    03/23/21 0250 03/23/21 1508 03/24/21 0401  WBC 8.3 6.7 8.9  HGB 12.1* 11.9* 12.0*  HCT 35.7* 36.5* 36.1*  PLT 246 226 236   BMET Recent Labs    03/22/21 1116 03/23/21 0250 03/24/21 0401  NA 136 139 138  K 3.8 4.0 3.9  CL 107 108 106  CO2 25 23 25   GLUCOSE 149* 100* 101*  BUN 38* 29* 21  CREATININE 0.94 0.69 0.89  CALCIUM 8.9 8.9 9.1   LFT Recent Labs    03/24/21 0401  PROT 6.1*  ALBUMIN 3.7  AST 17  ALT 20  ALKPHOS 55  BILITOT 1.6*   PT/INR Recent Labs    03/23/21 0250  LABPROT 13.9  INR 1.1    Studies/Results: CT Angio Abd/Pel W and/or Wo Contrast  Result Date: 03/22/2021 CLINICAL DATA:  Gastrointestinal bleed. Right lower quadrant pain and nausea. EXAM: CTA ABDOMEN AND PELVIS WITHOUT AND WITH CONTRAST TECHNIQUE: Multidetector CT imaging of the abdomen and pelvis was performed using the standard protocol during bolus administration of intravenous contrast. Multiplanar reconstructed images and MIPs were obtained and  reviewed to evaluate the vascular anatomy. CONTRAST:  147mL OMNIPAQUE IOHEXOL 350 MG/ML SOLN COMPARISON:  None. FINDINGS: VASCULAR No extravasation of intravenous contrast noted. Aorta: Mild to moderate atherosclerotic plaque. Normal caliber aorta without aneurysm, dissection, vasculitis or significant stenosis. Celiac: Patent without evidence of aneurysm, dissection, vasculitis or significant stenosis. SMA: Patent without evidence of aneurysm, dissection, vasculitis or significant stenosis. Renals: Mild atherosclerotic plaque. Both renal arteries are patent without evidence of aneurysm, dissection, vasculitis, fibromuscular dysplasia or significant stenosis. IMA: Patent without evidence of aneurysm, dissection, vasculitis or significant stenosis. Inflow: Patent without evidence of aneurysm, dissection, vasculitis or significant stenosis. Proximal Outflow: Bilateral common femoral and visualized portions of the superficial and profunda femoral arteries are patent without evidence of aneurysm, dissection, vasculitis or significant stenosis. Veins: The main portal, splenic, superior mesenteric veins are patent. Review of the MIP images confirms the above findings. NON-VASCULAR Lower chest: No acute abnormality. Hepatobiliary: No focal liver abnormality. No gallstones, gallbladder wall thickening, or pericholecystic fluid. Pneumobilia noted. Common bile duct stent noted with proximal tip within the mid common bile duct just past the pancreas. Distal tip noted within the 2nd/3rd portion of the duodenum. No intra or extrahepatic biliary ductal dilatation. Pancreas: Pancreatic sten noted with proximal tip within the proximal pancreas and distal tip within the 2nd/3rd portion of the duodenum. No focal lesion. Normal pancreatic contour. No surrounding inflammatory changes. No main pancreatic ductal dilatation. Spleen: Normal in size without focal  abnormality. Adrenals/Urinary Tract: No adrenal nodule bilaterally. Bilateral  kidneys enhance symmetrically. No hydronephrosis. No hydroureter. The urinary bladder is unremarkable. Stomach/Bowel: Stomach is within normal limits. No evidence of bowel wall thickening or dilatation. No pneumatosis. Appendix appears normal in caliber with no inflammatory changes within the right lower quadrant. Appendicolith is noted. Lymphatic: No lymphadenopathy. Reproductive: The prostate is enlarged measuring up to 5.1 cm. Other: No intraperitoneal free fluid. No intraperitoneal free gas. No organized fluid collection. Musculoskeletal: No abdominal wall hernia or abnormality. No suspicious lytic or blastic osseous lesions. No acute displaced fracture. Severe multilevel degenerative changes of the spine. IMPRESSION: VASCULAR 1. No CT evidence of active gastrointestinal bleed. 2. No acute intra-abdominal or intrapelvic abnormality. 3.  Aortic Atherosclerosis (ICD10-I70.0). NON-VASCULAR 1. No acute intra-abdominal or intrapelvic abnormality. 2. Common bile duct stent with proximal tip within the mid common bile duct just past the pancreas. Distal tip within the 2nd/3rd portion of the duodenum. Associated pneumobilia suggestive of patency. 3. Pancreatic stent with proximal tip within the proximal pancreas and distal tip within the 2nd/3rd portion of the duodenum. Electronically Signed   By: Iven Finn M.D.   On: 03/22/2021 19:20      Assessment & Recommendations   S/P ampullectomy for a benign ampullary adenoma. Self limited bleed from an ulcer at ampullectomy site. Mild ABL anemia. PD and biliary stents are in good position. Continue pantoprazole 40 mg po bid for 2 weeks then change to qd. Continue sucralfate suspension 1 g po qid for 2 weeks then discontinue. Avoid ASA/NSAIDs. Continue to hold Eliquis through Monday and Dr. Rush Landmark will advise on time to resume. No strenuous activity for 5 days at home. Pt is advised to call office regarding follow up with Dr. Rush Landmark. OK for discharge home today  from GI standpoint. GI signing off.       LOS: 1 day   Nely Dedmon T. Fuller Plan  03/24/2021, 10:31 AM See Shea Evans, [facility], to contact our on call provider

## 2021-03-24 NOTE — Discharge Summary (Signed)
Physician Discharge Summary  [name] HWE:[id number] DOB: 1945/08/22 DOA: 03/22/2021  PCP: Biagio Borg, MD  Admit date: 03/22/2021 Discharge date: 03/24/2021  Admitted From: home Disposition: home  Recommendations for Outpatient Follow-up:  Follow up with PCP in 1-2 weeks Please obtain BMP/CBC in one week Follow up with GI   Home Health: None Equipment/Devices: None Discharge Condition: Stable CODE STATUS: Full code Diet recommendation:  cardiac diet Brief/Interim Summary:75 year old with history of P A. fib on Eliquis, CAD, ischemic cardiomyopathy status post [employer] CRT, systolic CHF EF 89%, OSA on CPAP, HTN, HLD, RA, GERD sent to the hospital ED from GI clinic.  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  Temporary plastic stent was placed but since then he has had dark stools.  He has not been on Eliquis since his procedure and was advised to come to the hospital for further evaluation.  GI team was consulted.   Discharge Diagnoses:  Principal Problem:   Acute upper GI bleed Active Problems:   Hyperlipidemia   OSA on CPAP   Essential hypertension   Coronary artery disease involving native coronary artery of native heart without angina pectoris   Gastroesophageal reflux disease   Benign prostatic hyperplasia with urinary obstruction   Chronic systolic heart failure (HCC)   Rheumatoid arthritis (HCC)   AF (paroxysmal atrial fibrillation) (HCC)   Melena   GI bleed     Acute upper GI bleed-patient was admitted with melanotic stools for the past 3 days prior to admission to hospital.  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  Patient is also on Eliquis at home for atrial fibrillation.  Patient was seen by GI recommended PPI and transfuse as needed.  EGD by Dr. Fuller Plan benign-appearing nonobstructive distal esophageal stenosis.   Small hiatal hernia multiple gastric polyps nonbleeding duodenal ulcer with a flat pigmented spot at the site of ampullectomy. Patient tolerated a soft diet prior to discharge.  GI recommended no aspirin NSAIDs for 2 weeks.  Follow-up with Dr. Rush Landmark this week to figure out when he can go back on Eliquis.  Hold Eliquis at least for 3 more days.-CTA of the abdomen-negative for active bleeding   Paroxysmal atrial fibrillation hold Eliquis for 3 more days follow-up with Dr. Shaune Spittle already this week to decide when to restart Eliquis.  Continue Coreg.   Coronary artery disease with history of ischemic cardiomyopathy   Continue Coreg, statin   Congestive heart failure with reduced ejection fraction, 55%  on Coreg, Entresto, Aldactone   Essential hypertension - On Coreg, Entresto and Aldactone   History of rheumatoid arthritis - On Plaquenil   Hyperlipidemia - Statin   BPH - Flomax   OSA -CPAP    Estimated body mass index is 29.76 kg/m as calculated from the following:   Height as of this encounter: 5\' 7"  (1.702 m).   Weight as of this encounter: 86.2 kg.  Discharge Instructions  Discharge Instructions     Diet - low sodium heart healthy   Complete by: As directed    Increase activity slowly   Complete by: As directed       Allergies as of 03/24/2021       Reactions   Sulfonamide Derivatives Rash            Medication List     STOP taking these medications    apixaban 5 MG Tabs  tablet Commonly known as: Eliquis       TAKE these medications    acetaminophen 650 MG CR tablet Commonly known as: TYLENOL Take 650 mg by mouth in the morning and at bedtime.   atorvastatin 20 MG tablet Commonly known as: LIPITOR TAKE 1 TABLET BY MOUTH DAILY Notes to patient: 03/25/2021   b complex vitamins tablet Take 1 tablet by mouth daily with lunch. Notes to patient: 03/24/2021    carvedilol 25 MG tablet Commonly known as: COREG TAKE 1 TABLET BY MOUTH 2 TIMES  DAILY. GENERIC EQUIVALENT FOR COREG. What changed: See the new instructions. Notes to patient: 03/24/2021 evening dose    cholecalciferol 25 MCG (1000 UNIT) tablet Commonly known as: VITAMIN D3 Take 1,000 Units by mouth daily. Notes to patient: 03/25/2021    CO Q 10 PO Take 200 mg by mouth daily with lunch. Notes to patient: 03/24/2021 lunch    diclofenac sodium 1 % Gel Commonly known as: VOLTAREN Apply 1 application topically 4 (four) times daily as needed (pain).   Entresto 49-51 MG Generic drug: sacubitril-valsartan TAKE 1 TABLET BY MOUTH TWICE DAILY Notes to patient: 03/24/2021 bedtime dose    FLAXSEED OIL PO Take 1 capsule by mouth daily. With Omega 3 Notes to patient: 03/28/4173    folic acid 081 MCG tablet Commonly known as: FOLVITE Take 400 mcg by mouth daily. Notes to patient: 03/25/2021    GLUCOSAMINE PO Take 1,500 mg by mouth every evening. Notes to patient: 03/24/2021 evening dose    hydrocortisone 2.5 % cream Apply 1 application topically 2 (two) times daily as needed (rash on nose).   hydroxychloroquine 200 MG tablet Commonly known as: PLAQUENIL TAKE 1 TABLET(200 MG) BY MOUTH DAILY What changed: See the new instructions. Notes to patient: 03/24/2021 evening    Lutein 20 MG Tabs Take 20 mg by mouth daily. Notes to patient: 03/25/2021    multivitamin capsule Take 1 capsule by mouth daily. Notes to patient: 03/25/2021    nitroGLYCERIN 0.4 MG SL tablet Commonly known as: NITROSTAT Place 1 tablet (0.4 mg total) under the tongue every 5 (five) minutes x 3 doses as needed for chest pain.   NON FORMULARY CPAP machine with sleep.   pantoprazole 40 MG tablet Commonly known as: PROTONIX Take 1 tablet (40 mg total) by mouth 2 (two) times daily before a meal. TAKE 1 TABLET BY MOUTH EVERY DAY BEFORE BREAKFAST Notes to patient: 03/24/2021 evening dose    polyethylene glycol 17 g packet Commonly known as: MIRALAX / GLYCOLAX Take 17 g by mouth daily as needed for  mild constipation.   PROBIOTIC DAILY PO Take 1 capsule by mouth daily with lunch. Notes to patient: 03/24/2021 lunch    Restasis 0.05 % ophthalmic emulsion Generic drug: cycloSPORINE Place 1 drop into both eyes 2 (two) times daily. Notes to patient: 03/24/2021 evening    SAW PALMETTO PO Take 450 mg by mouth every evening. Notes to patient: 03/24/2021    spironolactone 25 MG tablet Commonly known as: ALDACTONE TAKE 1/2 TABLET(12.5 MG) BY MOUTH DAILY What changed: See the new instructions. Notes to patient: 03/25/2021    sucralfate 1 g tablet Commonly known as: CARAFATE Take 1 tablet (1 g total) by mouth 4 (four) times daily. What changed: when to take this Notes to patient: 03/24/2021 2 more doses    tamsulosin 0.4 MG Caps capsule Commonly known as: FLOMAX Take 0.4 mg by mouth daily. Notes to patient: 03/25/2021    Testosterone 20 % Crea Apply  2 mLs topically daily. Rub on shoulder   traMADol 50 MG tablet Commonly known as: ULTRAM TAKE 1 TABLET(50 MG) BY MOUTH AT BEDTIME AS NEEDED What changed: See the new instructions.   TURMERIC PO Take 1 tablet by mouth daily with lunch. Notes to patient: 03/24/2021 lunch    vitamin C 500 MG tablet Commonly known as: ASCORBIC ACID Take 500 mg by mouth daily. Notes to patient: 03/25/2021    zinc gluconate 50 MG tablet Take 50 mg by mouth daily. Notes to patient: 03/25/2021         Follow-up Information     Biagio Borg, MD Follow up.   Specialties: Internal Medicine, Radiology Contact information: [place] [address] [phone]         Constance Haw, MD .   Specialty: Cardiology Contact information: [address] [place] [address] [phone]         [name], MD .   Specialty: Cardiology Contact information: [address] [place] [address] [phone]         Mansouraty, Telford Nab., MD Follow up.   Specialties:  Gastroenterology, Internal Medicine Contact information: [place] [address] [phone]                Allergies  Allergen Reactions   Sulfonamide Derivatives Rash         Consultations: GI   Procedures/Studies: DG ERCP  Result Date: 03/19/2021 CLINICAL DATA:  Ampullary adenoma. EXAM: ERCP TECHNIQUE: Multiple spot images obtained with the fluoroscopic device and submitted for interpretation post-procedure. COMPARISON:  CT of the abdomen on 01/11/2021 FINDINGS: Intraoperative imaging with a C-arm demonstrates cannulation and guidewire Vance mint in both the common bile duct and pancreatic duct. Contrast injection at the level of the common bile duct demonstrates no discrete filling defect. There may be some relative narrowing of the distal common bile duct. During the procedure, a common bile duct stent and pancreatic ductal stent replaced. IMPRESSION: Placement of common bile duct and pancreatic stents during endoscopic procedure. Cholangiogram demonstrates some relative narrowing of the distal common bile duct. These images were submitted for radiologic interpretation only. Please see the procedural report for the amount of contrast and the fluoroscopy time utilized. Electronically Signed   By: Aletta Edouard M.D.   On: 03/19/2021 10:35   CT Angio Abd/Pel W and/or Wo Contrast  Result Date: 03/22/2021 CLINICAL DATA:  Gastrointestinal bleed. Right lower quadrant pain and nausea. EXAM: CTA ABDOMEN AND PELVIS WITHOUT AND WITH CONTRAST TECHNIQUE: Multidetector CT imaging of the abdomen and pelvis was performed using the standard protocol during bolus administration of intravenous contrast. Multiplanar reconstructed images and MIPs were obtained and reviewed to evaluate the vascular anatomy. CONTRAST:  126mL OMNIPAQUE IOHEXOL 350 MG/ML SOLN COMPARISON:  None. FINDINGS: VASCULAR No extravasation of intravenous contrast noted. Aorta: Mild to moderate atherosclerotic  plaque. Normal caliber aorta without aneurysm, dissection, vasculitis or significant stenosis. Celiac: Patent without evidence of aneurysm, dissection, vasculitis or significant stenosis. SMA: Patent without evidence of aneurysm, dissection, vasculitis or significant stenosis. Renals: Mild atherosclerotic plaque. Both renal arteries are patent without evidence of aneurysm, dissection, vasculitis, fibromuscular dysplasia or significant stenosis. IMA: Patent without evidence of aneurysm, dissection, vasculitis or significant stenosis. Inflow: Patent without evidence of aneurysm, dissection, vasculitis or significant stenosis. Proximal Outflow: Bilateral common femoral and visualized portions of the superficial and profunda femoral arteries are patent without evidence of aneurysm, dissection, vasculitis or  significant stenosis. Veins: The main portal, splenic, superior mesenteric veins are patent. Review of the MIP images confirms the above findings. NON-VASCULAR Lower chest: No acute abnormality. Hepatobiliary: No focal liver abnormality. No gallstones, gallbladder wall thickening, or pericholecystic fluid. Pneumobilia noted. Common bile duct stent noted with proximal tip within the mid common bile duct just past the pancreas. Distal tip noted within the 2nd/3rd portion of the duodenum. No intra or extrahepatic biliary ductal dilatation. Pancreas: Pancreatic sten noted with proximal tip within the proximal pancreas and distal tip within the 2nd/3rd portion of the duodenum. No focal lesion. Normal pancreatic contour. No surrounding inflammatory changes. No main pancreatic ductal dilatation. Spleen: Normal in size without focal abnormality. Adrenals/Urinary Tract: No adrenal nodule bilaterally. Bilateral kidneys enhance symmetrically. No hydronephrosis. No hydroureter. The urinary bladder is unremarkable. Stomach/Bowel: Stomach is within normal limits. No evidence of bowel wall thickening or dilatation. No pneumatosis.  Appendix appears normal in caliber with no inflammatory changes within the right lower quadrant. Appendicolith is noted. Lymphatic: No lymphadenopathy. Reproductive: The prostate is enlarged measuring up to 5.1 cm. Other: No intraperitoneal free fluid. No intraperitoneal free gas. No organized fluid collection. Musculoskeletal: No abdominal wall hernia or abnormality. No suspicious lytic or blastic osseous lesions. No acute displaced fracture. Severe multilevel degenerative changes of the spine. IMPRESSION: VASCULAR 1. No CT evidence of active gastrointestinal bleed. 2. No acute intra-abdominal or intrapelvic abnormality. 3.  Aortic Atherosclerosis (ICD10-I70.0). NON-VASCULAR 1. No acute intra-abdominal or intrapelvic abnormality. 2. Common bile duct stent with proximal tip within the mid common bile duct just past the pancreas. Distal tip within the 2nd/3rd portion of the duodenum. Associated pneumobilia suggestive of patency. 3. Pancreatic stent with proximal tip within the proximal pancreas and distal tip within the 2nd/3rd portion of the duodenum. Electronically Signed   By: Iven Finn M.D.   On: 03/22/2021 19:20   (Echo, Carotid, EGD, Colonoscopy, ERCP)    Subjective:  Patient sitting up by the side of the bed had a rough night as CPAP mask did not work for him.  Anxious to go home Discharge Exam: Vitals:   03/23/21 2223 03/24/21 0509  BP:  (!) 109/58  Pulse:  93  Resp: 18 18  Temp:  98 F (36.7 C)  SpO2:  95%   Vitals:   03/23/21 1724 03/23/21 2211 03/23/21 2223 03/24/21 0509  BP: 111/63 115/74  (!) 109/58  Pulse: 83 89  93  Resp: 20 18 18 18   Temp: 98.1 F (36.7 C) 98.3 F (36.8 C)  98 F (36.7 C)  TempSrc: Oral Oral  Oral  SpO2: 99% 95%  95%  Weight:      Height:        General: Pt is alert, awake, not in acute distress Cardiovascular: RRR, S1/S2 +, no rubs, no gallops Respiratory: CTA bilaterally, no wheezing, no rhonchi Abdominal: Soft, NT, ND, bowel sounds  + Extremities: no edema, no cyanosis    The results of significant diagnostics from this hospitalization (including imaging, microbiology, ancillary and laboratory) are listed below for reference.     Microbiology: Recent Results (from the past 240 hour(s))  Resp Panel by RT-PCR (Flu A&B, Covid) Nasopharyngeal Swab     Status: None   Collection Time: 03/22/21  7:39 PM   Specimen: Nasopharyngeal Swab; Nasopharyngeal(NP) swabs in vial transport medium  Result Value Ref Range Status   SARS Coronavirus 2 by RT PCR NEGATIVE NEGATIVE Final    Comment: (NOTE) SARS-CoV-2 target nucleic acids are NOT DETECTED.  The SARS-CoV-2 RNA is generally detectable in upper respiratory specimens during the acute phase of infection. The lowest concentration of SARS-CoV-2 viral copies this assay can detect is 138 copies/mL. A negative result does not preclude SARS-Cov-2 infection and should not be used as the sole basis for treatment or other patient management decisions. A negative result may occur with  improper specimen collection/handling, submission of specimen other than nasopharyngeal swab, presence of viral mutation(s) within the areas targeted by this assay, and inadequate number of viral copies(<138 copies/mL). A negative result must be combined with clinical observations, patient history, and epidemiological information. The expected result is Negative.  Fact Sheet for Patients:  EntrepreneurPulse.com.au  Fact Sheet for Healthcare Providers:  IncredibleEmployment.be  This test is no t yet approved or cleared by the [place] FDA and  has been authorized for detection and/or diagnosis of SARS-CoV-2 by FDA under an Emergency Use Authorization (EUA). This EUA will remain  in effect (meaning this test can be used) for the duration of the COVID-19 declaration under Section 564(b)(1) of the Act, 21 U.S.C.section 360bbb-3(b)(1), unless the authorization  is terminated  or revoked sooner.       Influenza A by PCR NEGATIVE NEGATIVE Final   Influenza B by PCR NEGATIVE NEGATIVE Final    Comment: (NOTE) The Xpert Xpress SARS-CoV-2/FLU/RSV plus assay is intended as an aid in the diagnosis of influenza from Nasopharyngeal swab specimens and should not be used as a sole basis for treatment. Nasal washings and aspirates are unacceptable for Xpert Xpress SARS-CoV-2/FLU/RSV testing.  Fact Sheet for Patients: EntrepreneurPulse.com.au  Fact Sheet for Healthcare Providers: IncredibleEmployment.be  This test is not yet approved or cleared by the [place] FDA and has been authorized for detection and/or diagnosis of SARS-CoV-2 by FDA under an Emergency Use Authorization (EUA). This EUA will remain in effect (meaning this test can be used) for the duration of the COVID-19 declaration under Section 564(b)(1) of the Act, 21 U.S.C. section 360bbb-3(b)(1), unless the authorization is terminated or revoked.  Performed at [facility], [place] [address], [place], [place] [address]      Labs: BNP (last 3 results) No results for input(s): BNP in the last 8760 hours. Basic Metabolic Panel: Recent Labs  Lab 03/22/21 0758 03/22/21 1116 03/23/21 0250 03/24/21 0401  NA 139 136 139 138  K 3.8 3.8 4.0 3.9  CL 106 107 108 106  CO2 25 25 23 25   GLUCOSE 98 149* 100* 101*  BUN 34* 38* 29* 21  CREATININE 0.87 0.94 0.69 0.89  CALCIUM 9.3 8.9 8.9 9.1  MG  --   --  2.3 2.4   Liver Function Tests: Recent Labs  Lab 03/22/21 0758 03/22/21 1116 03/23/21 0250 03/24/21 0401  AST 16 17 17 17   ALT 17 19 20 20   ALKPHOS 53 52 49 55  BILITOT 1.2 1.4* 1.2 1.6*  PROT 6.6 6.3* 5.9* 6.1*  ALBUMIN 4.2 3.9 3.6 3.7   Recent Labs  Lab 03/22/21 0758  LIPASE 22.0  AMYLASE 23*   No results for input(s): AMMONIA in the last 168 hours. CBC: Recent Labs  Lab 03/22/21 1116 03/22/21 1740  03/23/21 0250 03/23/21 1508 03/24/21 0401  WBC 8.6 9.2 8.3 6.7 8.9  NEUTROABS  --  5.7  --   --   --   HGB 13.0 13.6 12.1* 11.9* 12.0*  HCT 39.8 41.1 35.7* 36.5* 36.1*  MCV 91.5 90.5 88.8 92.2 90.3  PLT 264 291 246 226 236  Cardiac Enzymes: No results for input(s): CKTOTAL, CKMB, CKMBINDEX, TROPONINI in the last 168 hours. BNP: Invalid input(s): POCBNP CBG: No results for input(s): GLUCAP in the last 168 hours. D-Dimer No results for input(s): DDIMER in the last 72 hours. Hgb A1c No results for input(s): HGBA1C in the last 72 hours. Lipid Profile No results for input(s): CHOL, HDL, LDLCALC, TRIG, CHOLHDL, LDLDIRECT in the last 72 hours. Thyroid function studies No results for input(s): TSH, T4TOTAL, T3FREE, THYROIDAB in the last 72 hours.  Invalid input(s): FREET3 Anemia work up No results for input(s): VITAMINB12, FOLATE, FERRITIN, TIBC, IRON, RETICCTPCT in the last 72 hours. Urinalysis    Component Value Date/Time   COLORURINE YELLOW 01/18/2020 1440   APPEARANCEUR CLEAR 01/18/2020 1440   LABSPEC >=1.030 (A) 01/18/2020 1440   PHURINE 6.0 01/18/2020 1440   GLUCOSEU NEGATIVE 01/18/2020 1440   HGBUR TRACE-INTACT (A) 01/18/2020 1440   BILIRUBINUR NEGATIVE 01/18/2020 1440   KETONESUR TRACE (A) 01/18/2020 1440   UROBILINOGEN 4.0 (A) 01/18/2020 1440   NITRITE NEGATIVE 01/18/2020 1440   LEUKOCYTESUR NEGATIVE 01/18/2020 1440   Sepsis Labs Invalid input(s): PROCALCITONIN,  WBC,  LACTICIDVEN Microbiology Recent Results (from the past 240 hour(s))  Resp Panel by RT-PCR (Flu A&B, Covid) Nasopharyngeal Swab     Status: None   Collection Time: 03/22/21  7:39 PM   Specimen: Nasopharyngeal Swab; Nasopharyngeal(NP) swabs in vial transport medium  Result Value Ref Range Status   SARS Coronavirus 2 by RT PCR NEGATIVE NEGATIVE Final    Comment: (NOTE) SARS-CoV-2 target nucleic acids are NOT DETECTED.  The SARS-CoV-2 RNA is generally detectable in upper respiratory specimens  during the acute phase of infection. The lowest concentration of SARS-CoV-2 viral copies this assay can detect is 138 copies/mL. A negative result does not preclude SARS-Cov-2 infection and should not be used as the sole basis for treatment or other patient management decisions. A negative result may occur with  improper specimen collection/handling, submission of specimen other than nasopharyngeal swab, presence of viral mutation(s) within the areas targeted by this assay, and inadequate number of viral copies(<138 copies/mL). A negative result must be combined with clinical observations, patient history, and epidemiological information. The expected result is Negative.  Fact Sheet for Patients:  EntrepreneurPulse.com.au  Fact Sheet for Healthcare Providers:  IncredibleEmployment.be  This test is no t yet approved or cleared by the [place] FDA and  has been authorized for detection and/or diagnosis of SARS-CoV-2 by FDA under an Emergency Use Authorization (EUA). This EUA will remain  in effect (meaning this test can be used) for the duration of the COVID-19 declaration under Section 564(b)(1) of the Act, 21 U.S.C.section 360bbb-3(b)(1), unless the authorization is terminated  or revoked sooner.       Influenza A by PCR NEGATIVE NEGATIVE Final   Influenza B by PCR NEGATIVE NEGATIVE Final    Comment: (NOTE) The Xpert Xpress SARS-CoV-2/FLU/RSV plus assay is intended as an aid in the diagnosis of influenza from Nasopharyngeal swab specimens and should not be used as a sole basis for treatment. Nasal washings and aspirates are unacceptable for Xpert Xpress SARS-CoV-2/FLU/RSV testing.  Fact Sheet for Patients: EntrepreneurPulse.com.au  Fact Sheet for Healthcare Providers: IncredibleEmployment.be  This test is not yet approved or cleared by the [place] FDA and has been authorized for detection  and/or diagnosis of SARS-CoV-2 by FDA under an Emergency Use Authorization (EUA). This EUA will remain in effect (meaning this test can be used) for the duration of the COVID-19 declaration under  Section 564(b)(1) of the Act, 21 U.S.C. section 360bbb-3(b)(1), unless the authorization is terminated or revoked.  Performed at [facility], [place] [address] [place], [place] [address]      Time coordinating discharge: 39 minutes  SIGNED: Georgette Shell, MD  [facility] 03/24/2021, 5:28 PM

## 2021-03-24 NOTE — Plan of Care (Signed)
  Problem: Health Behavior/Discharge Planning: Goal: Ability to manage health-related needs will improve Outcome: Progressing   Problem: Elimination: Goal: Will not experience complications related to bowel motility Outcome: Progressing   

## 2021-03-24 NOTE — Plan of Care (Signed)
Discharge instructions reviewed with patient and spouse, questions answered, verbalized understanding.  Patient transported to main entrance via wheelchair to be taken home by wife.

## 2021-03-25 ENCOUNTER — Encounter (HOSPITAL_COMMUNITY): Payer: Self-pay | Admitting: Gastroenterology

## 2021-03-26 NOTE — Telephone Encounter (Signed)
See My Chart message sent by pt just prior to this message.

## 2021-03-26 NOTE — Anesthesia Postprocedure Evaluation (Signed)
Anesthesia Post Note  Patient: [name]  Procedure(s) Performed: ESOPHAGOGASTRODUODENOSCOPY (EGD) WITH PROPOFOL     Patient location during evaluation: PACU Anesthesia Type: MAC Level of consciousness: awake and alert Pain management: pain level controlled Vital Signs Assessment: post-procedure vital signs reviewed and stable Respiratory status: spontaneous breathing, nonlabored ventilation, respiratory function stable and patient connected to nasal cannula oxygen Cardiovascular status: stable and blood pressure returned to baseline Postop Assessment: no apparent nausea or vomiting Anesthetic complications: no   No notable events documented.  Last Vitals:  Vitals:   03/23/21 2223 03/24/21 0509  BP:  (!) 109/58  Pulse:  93  Resp: 18 18  Temp:  36.7 C  SpO2:  95%    Last Pain:  Vitals:   03/24/21 0820  TempSrc:   PainSc: 0-No pain                 Lexi Conaty S

## 2021-03-26 NOTE — Telephone Encounter (Signed)
Patient called wanting to follow up on when he should start the Eliquis again after having the procedure with Dr. Rush Landmark.

## 2021-03-26 NOTE — Telephone Encounter (Signed)
Dr Rush Landmark please see pt question regarding Eliquis and when to resume.  Also, when he will need follow up? He is due for Xray to eval stent placement I believe next week.

## 2021-04-04 ENCOUNTER — Ambulatory Visit (INDEPENDENT_AMBULATORY_CARE_PROVIDER_SITE_OTHER)
Admission: RE | Admit: 2021-04-04 | Discharge: 2021-04-04 | Disposition: A | Discharge status: Home / Self Care | Payer: Medicare Other | Source: Ambulatory Visit | Attending: Gastroenterology | Admitting: Gastroenterology

## 2021-04-04 ENCOUNTER — Other Ambulatory Visit: Payer: Self-pay

## 2021-04-04 DIAGNOSIS — T85528A Displacement of other gastrointestinal prosthetic devices, implants and grafts, initial encounter: Secondary | ICD-10-CM

## 2021-04-04 DIAGNOSIS — Z9689 Presence of other specified functional implants: Secondary | ICD-10-CM | POA: Diagnosis not present

## 2021-04-04 DIAGNOSIS — M47816 Spondylosis without myelopathy or radiculopathy, lumbar region: Secondary | ICD-10-CM | POA: Diagnosis not present

## 2021-04-05 ENCOUNTER — Other Ambulatory Visit: Payer: Self-pay

## 2021-04-05 DIAGNOSIS — T85528A Displacement of other gastrointestinal prosthetic devices, implants and grafts, initial encounter: Secondary | ICD-10-CM

## 2021-04-05 DIAGNOSIS — Z4689 Encounter for fitting and adjustment of other specified devices: Secondary | ICD-10-CM

## 2021-04-11 NOTE — Progress Notes (Signed)
Office Visit Note  Patient: [name]             Date of Birth: 1946-01-19           MRN: [id number]             PCP: Biagio Borg, MD Referring: Biagio Borg, MD Visit Date: 04/19/2021 Occupation: @GUAROCC @  Subjective:  Pain in multiple joints.   History of Present Illness: [name] is a 75 y.o. male with a history of rheumatoid arthritis and osteoarthritis overlap.  He states he has been taking hydroxychloroquine on a regular basis.  He denies any joint swelling.  Although all of his joints are painful.  He describes pain and discomfort in his bilateral hands, lower back, both knees and his feet.  He states he was seen at [facility] recently and was told that his left knee joint is bone-on-bone and Dr. Doran Durand examined his right foot and ask advised surgery for his right first MTP joint.  He also had recent endoscopy and had esophageal precancerous polyp removed per patient.  Activities of Daily Living:  Patient reports morning stiffness for all day. Patient Reports nocturnal pain.  Difficulty dressing/grooming: Denies Difficulty climbing stairs: Reports Difficulty getting out of chair: Reports Difficulty using hands for taps, buttons, cutlery, and/or writing: Reports  Review of Systems  Constitutional:  Positive for fatigue.  HENT:  Negative for mouth sores, mouth dryness and nose dryness.   Eyes:  Positive for itching and dryness.  Respiratory:  Positive for shortness of breath. Negative for difficulty breathing.   Cardiovascular:  Negative for chest pain and palpitations.  Gastrointestinal:  Negative for blood in stool, constipation and diarrhea.  Endocrine: Negative for increased urination.  Genitourinary:  Negative for difficulty urinating.  Musculoskeletal:  Positive for joint pain, joint pain and morning stiffness. Negative for joint swelling, myalgias, muscle tenderness and myalgias.  Skin:  Negative for color change, rash, redness and sensitivity to  sunlight.  Allergic/Immunologic: Negative for susceptible to infections.  Neurological:  Positive for weakness. Negative for dizziness, numbness, headaches and memory loss.  Hematological:  Negative for bruising/bleeding tendency.  Psychiatric/Behavioral:  Negative for confusion.    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   Abnormal findings on esophagogastroduodenoscopy (EGD) 02/03/2021   Polyp of duodenum 02/03/2021   Axillary abscess 06/26/2020   Chronic diarrhea 06/26/2020   Fever 01/18/2020   Pericarditis 07/06/2019   Right hip pain 07/06/2019   Acute chest pain 01/24/2019   Prediabetes 12/24/2018   Rheumatoid arthritis ([place]) 12/24/2018   Preventative health care 06/25/2018   Incarcerated ventral hernia 03/20/2018   DCM (dilated cardiomyopathy) ([place]) 38/02/1750   Chronic systolic heart failure ([place]) 01/15/2017   History of pneumonia 10/23/2016   Osteoarthritis 10/23/2016   Primary osteoarthritis of both feet 10/04/2016   Primary osteoarthritis of left knee 10/04/2016   DDD (degenerative disc disease), lumbar 10/04/2016   Hemoptysis 05/27/2016   Overweight (BMI 25.0-29.9) 03/14/2016   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   Parotid mass 08/23/2015   Dyspnea 11/29/2014   Synovial cyst of lumbar facet joint 09/26/2014   Incisional hernia, without obstruction or gangrene 12/06/2013   Nausea alone 08/27/2013   Colon cancer screening 08/27/2013   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   Benign prostatic hyperplasia with urinary obstruction  06/18/2010   Other specified disorder of stomach and duodenum 12/16/2008   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 11/15/2008   Gastroesophageal reflux disease 09/02/2008   COLONIC POLYPS, HYPERPLASTIC 05/24/2007   OSA on CPAP 05/24/2007   ALLERGIC RHINITIS 05/24/2007    ESOPHAGEAL STRICTURE 05/24/2007   HIATAL HERNIA 05/24/2007   Hyperlipidemia 02/06/2007   Essential hypertension 02/06/2007   Coronary artery disease involving native coronary artery of native heart without angina pectoris 02/06/2007   Primary osteoarthritis of both hands 02/06/2007   Neck pain on right side 02/06/2007    Past Medical History:  Diagnosis Date   AICD (automatic cardioverter/defibrillator) present    Allergic rhinitis    Allergy    Anginal pain ([place]) 05/26/14   chest pain after chasing dog   Atypical nevus of back 04/27/2003   moderate - mid lower back   Basal cell carcinoma 01/26/2008   right cheek - MOHs   CAD (coronary artery disease)    hx of stent- 2005 RCA   Cataract    beginning stage both eyes   CHF (congestive heart failure) (HCC)    pacemaker Medtronic     Chronic back pain    intermittent   Constipation    Dizziness    Dysrhythmia    right bundle branch block    Esophageal stricture    GERD (gastroesophageal reflux disease)    Hemoptysis    Hiatal hernia    History of colonic polyps    hyperplastic   HTN (hypertension)    Hyperlipidemia    Hypertrophy of prostate with urinary obstruction and other lower urinary tract symptoms (LUTS)    OA (osteoarthritis)    OSA (obstructive sleep apnea)    cpap- 10    Other specified disorder of stomach and duodenum    duodenal periampulary tubulovillous adenoma removed by Dr. Ardis Hughs 5/10   Pericarditis 07/06/2019   Pneumonia    Pre-diabetes    no medications   Shortness of breath    with exertion   Sleep apnea    cpap   Testicular hypofunction     Family History  Problem Relation Age of Onset   Heart disease Mother    Coronary artery disease Father    Diabetes Father    Sudden death Father        due to heart disease   Heart disease Father    COPD Brother    Arthritis Brother    Heart failure Brother    Cancer Brother        lung    Stomach cancer Paternal Grandmother    Arthritis  Daughter    Hypertension Other    Colon cancer Neg Hx    Esophageal cancer Neg Hx    Colon polyps Neg Hx    Ulcerative colitis Neg Hx    Liver disease Neg Hx    Pancreatic cancer Neg Hx    Inflammatory bowel disease Neg Hx    Rectal cancer Neg Hx    Past Surgical History:  Procedure Laterality Date   BILIARY STENT PLACEMENT N/A 03/19/2021   Procedure: BILIARY STENT PLACEMENT;  Surgeon: Rush Landmark, Telford Nab., MD;  [facility]: [facility];  Service: Gastroenterology;  Laterality: N/A;   BIV ICD INSERTION CRT-D N/A 03/27/2017   Procedure: BIV ICD INSERTION CRT-D;  Surgeon: Constance Haw, MD;  Location: [place] [facility];  Service: Cardiovascular;  Laterality: N/A;   CARDIAC CATHETERIZATION     '05, last 2009, showing patent RCA stent  COLONOSCOPY  12/2007   HYPERPLASTIC POLYP   COLONOSCOPY  12/2019   COLONOSCOPY WITH PROPOFOL N/A 06/02/2014   Procedure: COLONOSCOPY WITH PROPOFOL;  Surgeon: [name], MD;  [facility];  Service: Endoscopy;  Laterality: N/A;   CORONARY ANGIOPLASTY  08/2003   ENDOSCOPIC MUCOSAL RESECTION  03/19/2021   Procedure: ENDOSCOPIC MUCOSAL RESECTION, ampullectomy;  Surgeon: Irving Copas., MD;  [facility];  Service: Gastroenterology;;   ENDOSCOPIC RETROGRADE CHOLANGIOPANCREATOGRAPHY (ERCP) WITH PROPOFOL N/A 03/19/2021   Procedure: ENDOSCOPIC RETROGRADE CHOLANGIOPANCREATOGRAPHY (ERCP) WITH PROPOFOL;  Surgeon: Irving Copas., MD;  Location: [facility];  Service: Gastroenterology;  Laterality: N/A;   ESOPHAGOGASTRODUODENOSCOPY (EGD) WITH PROPOFOL N/A 06/02/2014   Procedure: ESOPHAGOGASTRODUODENOSCOPY (EGD) WITH PROPOFOL;  Surgeon: [name], MD;  [facility];  Service: Endoscopy;  Laterality: N/A;   ESOPHAGOGASTRODUODENOSCOPY (EGD) WITH PROPOFOL N/A 03/19/2021   Procedure: ESOPHAGOGASTRODUODENOSCOPY (EGD) WITH PROPOFOL;  Surgeon: Rush Landmark Telford Nab., MD;  [facility];  Service:  Gastroenterology;  Laterality: N/A;   ESOPHAGOGASTRODUODENOSCOPY (EGD) WITH PROPOFOL N/A 03/23/2021   Procedure: ESOPHAGOGASTRODUODENOSCOPY (EGD) WITH PROPOFOL;  Surgeon: Ladene Artist, MD;  [facility];  Service: Endoscopy;  Laterality: N/A;   hernia surgery x 3     Bilateral Inguinal, Umbicial   INSERTION OF MESH N/A 03/20/2018   Procedure: INSERTION OF MESH;  Surgeon: Alphonsa Overall, MD;  Location: [place];  Service: General;  Laterality: N/A;   IRRIGATION AND DEBRIDEMENT ABSCESS Left 08/14/2012   Procedure: IRRIGATION AND DEBRIDEMENT LEFT INGUINAL BOIL ;  Surgeon: Ailene Rud, MD;  [facility];  Service: Urology;  Laterality: Left;   JOINT REPLACEMENT Right 2017   KNEE ARTHROPLASTY     LEFT HEART CATH AND CORONARY ANGIOGRAPHY N/A 01/24/2019   Procedure: LEFT HEART CATH AND CORONARY ANGIOGRAPHY;  Surgeon: Martinique, Peter M, MD;  Location: [place] [facility];  Service: Cardiovascular;  Laterality: N/A;   LEFT HEART CATHETERIZATION WITH CORONARY ANGIOGRAM N/A 05/26/2014   Procedure: LEFT HEART CATHETERIZATION WITH CORONARY ANGIOGRAM;  Surgeon: Blane Ohara, MD;  Location: [facility];  Service: Cardiovascular;  Laterality: N/A;   LUMBAR LAMINECTOMY/DECOMPRESSION MICRODISCECTOMY Left 09/26/2014   Procedure: Lumbar Laminectomy for resection of synovial cyst  Lumbar five- sacral one left;  Surgeon: Kary Kos, MD;  Location: [facility];  Service: Neurosurgery;  Laterality: Left;   MOLE REMOVAL Left 03/20/2018   Procedure: MOLE REMOVAL;  Surgeon: Alphonsa Overall, MD;  Location: [facility];  Service: General;  Laterality: Left;   Oral surg to removed growth from ?sinus  10/2010   ?Dermoid removed by DrRiggs   PACEMAKER INSERTION  04/03/2017   PANCREATIC STENT PLACEMENT  03/19/2021   Procedure: PANCREATIC STENT PLACEMENT;  Surgeon: Irving Copas., MD;  [facility];  Service: Gastroenterology;;   POLYPECTOMY     RCA stenting     '05 RCA   right toe surgery  Right     Cyst    RIGHT/LEFT HEART CATH AND CORONARY ANGIOGRAPHY N/A 01/15/2017   Procedure: RIGHT/LEFT HEART CATH AND CORONARY ANGIOGRAPHY;  Surgeon: [name], MD;  Location: [place] [facility];  Service: Cardiovascular;  Laterality: N/A;   s/p right knee arthroscopy  2005   SPHINCTEROTOMY  03/19/2021   Procedure: SPHINCTEROTOMY;  Surgeon: Mansouraty, Telford Nab., MD;  Location: [facility];  Service: Gastroenterology;;   [place] INJECTION  03/19/2021   Procedure: SUBMUCOSAL LIFTING INJECTION;  Surgeon: Irving Copas., MD;  Location: [facility];  Service: Gastroenterology;;   Garen Lah  surgery Right    TOTAL KNEE ARTHROPLASTY Right 03/12/2016   Procedure: RIGHT TOTAL KNEE ARTHROPLASTY;  Surgeon: Paralee Cancel, MD;  [facility];  Service: Orthopedics;  Laterality: Right;   UPPER ESOPHAGEAL ENDOSCOPIC ULTRASOUND (EUS) N/A 03/19/2021   Procedure: UPPER ESOPHAGEAL ENDOSCOPIC ULTRASOUND (EUS);  Surgeon: Irving Copas., MD;  Location: [facility];  Service: Gastroenterology;  Laterality: N/A;   UPPER GASTROINTESTINAL ENDOSCOPY     VENTRAL HERNIA REPAIR N/A 03/20/2018   Procedure: LAPAROSCOPIC VENTRAL INCISIONAL  HERNIA ERAS PATHWAY;  Surgeon: Alphonsa Overall, MD;  Location: [facility];  Service: General;  Laterality: N/A;   Social History   Social History Narrative   Not on file   Immunization History  Administered Date(s) Administered   Fluad Quad(high Dose 65+) 02/18/2019   Influenza Split 03/21/2011   Influenza Whole 04/06/2009, 04/09/2010   Influenza, High Dose Seasonal PF 04/20/2015, 04/15/2016, 02/21/2017   Influenza,inj,Quad PF,6+ Mos 03/24/2014   Influenza-Unspecified 02/26/2018, 02/18/2019, 02/26/2020   PFIZER(Purple Top)SARS-COV-2 Vaccination 05/27/2019, 06/17/2019, 02/15/2020   Pneumococcal Conjugate-13 11/29/2014   Pneumococcal Polysaccharide-23 06/25/2018   Tdap 11/29/2014   Zoster Recombinat (Shingrix) 06/07/2018, 09/26/2018   Zoster, Live  05/20/2006     Objective: Vital Signs: BP 131/77 (BP Location: Left Arm, Patient Position: Sitting, Cuff Size: Normal)   Pulse 73   Ht 5\' 8"  (1.727 m)   Wt 194 lb (88 kg)   BMI 29.50 kg/m    Physical Exam Vitals and nursing note reviewed.  Constitutional:      Appearance: He is well-developed.  HENT:     Head: Normocephalic and atraumatic.  Eyes:     Conjunctiva/sclera: Conjunctivae normal.     Pupils: Pupils are equal, round, and reactive to light.  Cardiovascular:     Rate and Rhythm: Normal rate and regular rhythm.     Heart sounds: Normal heart sounds.     Comments: Pacemaker  Pulmonary:     Effort: Pulmonary effort is normal.     Breath sounds: Normal breath sounds.  Abdominal:     General: Bowel sounds are normal.     Palpations: Abdomen is soft.  Musculoskeletal:     Cervical back: Normal range of motion and neck supple.  Skin:    General: Skin is warm and dry.     Capillary Refill: Capillary refill takes less than 2 seconds.  Neurological:     Mental Status: He is alert and oriented to person, place, and time.  Psychiatric:        Behavior: Behavior normal.     Musculoskeletal Exam: C-spine was in good range of motion.  He had discomfort with range of motion.  Lumbar spine with limitation.  He also had tenderness over left SI joint.  Shoulder joints, elbow joints, wrist joints with good range of motion.  He had bilateral MCP thickening with no synovitis.  PIP and DIP thickening was noted.  Hip joints and knee joints in good range of motion.  There was no tenderness over ankles or MTPs.  CDAI Exam: CDAI Score: 0.4  Patient Global: 2 mm; Provider Global: 2 mm Swollen: 0 ; Tender: 1  Joint Exam 04/19/2021      Right  Left  Sacroiliac      Tender     Investigation: No additional findings.  Imaging: DG Abd 2 Views  Result Date: 04/04/2021 CLINICAL DATA:  Pancreatic stent placement evaluation. EXAM: ABDOMEN - 2 VIEW COMPARISON:  CT abdomen and pelvis  03/22/2021. FINDINGS: Pancreatic stent and common bile  duct stent appear unchanged in position when compared to prior CT given differences in technique. Bowel-gas pattern is within normal limits. No suspicious calcifications identified. Pacemaker is partially visualized. There are degenerative changes of the lower lumbar spine. IMPRESSION: Pancreatic and common bile duct stents appears unchanged in position. Electronically Signed   By: Ronney Asters M.D.   On: 04/04/2021 16:55   CT Angio Abd/Pel W and/or Wo Contrast  Result Date: 03/22/2021 CLINICAL DATA:  Gastrointestinal bleed. Right lower quadrant pain and nausea. EXAM: CTA ABDOMEN AND PELVIS WITHOUT AND WITH CONTRAST TECHNIQUE: Multidetector CT imaging of the abdomen and pelvis was performed using the standard protocol during bolus administration of intravenous contrast. Multiplanar reconstructed images and MIPs were obtained and reviewed to evaluate the vascular anatomy. CONTRAST:  158mL OMNIPAQUE IOHEXOL 350 MG/ML SOLN COMPARISON:  None. FINDINGS: VASCULAR No extravasation of intravenous contrast noted. Aorta: Mild to moderate atherosclerotic plaque. Normal caliber aorta without aneurysm, dissection, vasculitis or significant stenosis. Celiac: Patent without evidence of aneurysm, dissection, vasculitis or significant stenosis. SMA: Patent without evidence of aneurysm, dissection, vasculitis or significant stenosis. Renals: Mild atherosclerotic plaque. Both renal arteries are patent without evidence of aneurysm, dissection, vasculitis, fibromuscular dysplasia or significant stenosis. IMA: Patent without evidence of aneurysm, dissection, vasculitis or significant stenosis. Inflow: Patent without evidence of aneurysm, dissection, vasculitis or significant stenosis. Proximal Outflow: Bilateral common femoral and visualized portions of the superficial and profunda femoral arteries are patent without evidence of aneurysm, dissection, vasculitis or significant  stenosis. Veins: The main portal, splenic, superior mesenteric veins are patent. Review of the MIP images confirms the above findings. NON-VASCULAR Lower chest: No acute abnormality. Hepatobiliary: No focal liver abnormality. No gallstones, gallbladder wall thickening, or pericholecystic fluid. Pneumobilia noted. Common bile duct stent noted with proximal tip within the mid common bile duct just past the pancreas. Distal tip noted within the 2nd/3rd portion of the duodenum. No intra or extrahepatic biliary ductal dilatation. Pancreas: Pancreatic sten noted with proximal tip within the proximal pancreas and distal tip within the 2nd/3rd portion of the duodenum. No focal lesion. Normal pancreatic contour. No surrounding inflammatory changes. No main pancreatic ductal dilatation. Spleen: Normal in size without focal abnormality. Adrenals/Urinary Tract: No adrenal nodule bilaterally. Bilateral kidneys enhance symmetrically. No hydronephrosis. No hydroureter. The urinary bladder is unremarkable. Stomach/Bowel: Stomach is within normal limits. No evidence of bowel wall thickening or dilatation. No pneumatosis. Appendix appears normal in caliber with no inflammatory changes within the right lower quadrant. Appendicolith is noted. Lymphatic: No lymphadenopathy. Reproductive: The prostate is enlarged measuring up to 5.1 cm. Other: No intraperitoneal free fluid. No intraperitoneal free gas. No organized fluid collection. Musculoskeletal: No abdominal wall hernia or abnormality. No suspicious lytic or blastic osseous lesions. No acute displaced fracture. Severe multilevel degenerative changes of the spine. IMPRESSION: VASCULAR 1. No CT evidence of active gastrointestinal bleed. 2. No acute intra-abdominal or intrapelvic abnormality. 3.  Aortic Atherosclerosis (ICD10-I70.0). NON-VASCULAR 1. No acute intra-abdominal or intrapelvic abnormality. 2. Common bile duct stent with proximal tip within the mid common bile duct just past  the pancreas. Distal tip within the 2nd/3rd portion of the duodenum. Associated pneumobilia suggestive of patency. 3. Pancreatic stent with proximal tip within the proximal pancreas and distal tip within the 2nd/3rd portion of the duodenum. Electronically Signed   By: Iven Finn M.D.   On: 03/22/2021 19:20    Recent Labs: Lab Results  Component Value Date   WBC 8.9 03/24/2021   HGB 12.0 (L) 03/24/2021   PLT  236 03/24/2021   NA 138 03/24/2021   K 3.9 03/24/2021   CL 106 03/24/2021   CO2 25 03/24/2021   GLUCOSE 101 (H) 03/24/2021   BUN 21 03/24/2021   CREATININE 0.89 03/24/2021   BILITOT 1.6 (H) 03/24/2021   ALKPHOS 55 03/24/2021   AST 17 03/24/2021   ALT 20 03/24/2021   PROT 6.1 (L) 03/24/2021   ALBUMIN 3.7 03/24/2021   CALCIUM 9.1 03/24/2021   GFRAA 100 11/09/2020    Speciality Comments: PLQ eye exam: 03/14/2021 normal. [facility] Follow up 09/12/2021.  Procedures:  No procedures performed Allergies: Sulfonamide derivatives   Assessment / Plan:     Visit Diagnoses: Rheumatoid arthritis involving multiple sites with positive rheumatoid factor (HCC) - CCP +, RF -, 14-3-3 eta negative, Sed rate WNL.  He had no synovitis on my examination.  He is doing well on hydroxychloroquine.  High risk medication use - PLQ 200 mg 1 tablet po qd. PLQ eye exam: 03/14/21-labs obtained on March 24, 2021 were reviewed.  He was slightly anemic.  Intake of green leafy vegetables and multivitamin with iron was discussed.  CMP was normal.  He takes tramadol for pain management.  We will check UDS.  Plan: DRUG MONITOR, TRAMADOL,QN, URINE, DRUG MONITOR, PANEL 5, W/CONF, URINE  Primary osteoarthritis of both hands-he has severe osteoarthritis in his hands with DIP and PIP thickening.  He continues to have chronic discomfort in his hands.  Primary osteoarthritis of left knee-he has ongoing pain.  He states he was seen at [facility] and was advised total knee replacement.  Status post right  knee replacement-doing well.  Primary osteoarthritis of both feet-he is followed by Dr. Doran Durand.  He states Dr. Doran Durand suggested first MTP surgery.  Chronic left SI joint pain-he complains of pain and discomfort in his left SI joint.  Stretching exercises were discussed.  I also discussed future SI joint injection if he has ongoing pain.  DDD (degenerative disc disease), lumbar-he has been having increased pain and discomfort in his lower back.  He has an appointment with Dr. Saintclair Halsted today.  Height loss-he states that he lost 2-1/2 inches of his height.  I offered DEXA scan which he would like to hold off for right now.  Of the medical problems are listed as follows:  History of coronary artery disease  History of hyperlipidemia  Pacemaker  History of hypertension  History of CHF (congestive heart failure)  History of gastric polyp  History of colon polyps  History of gastroesophageal reflux (GERD)  History of BPH  History of sleep apnea  Orders: Orders Placed This Encounter  Procedures   DRUG MONITOR, TRAMADOL,QN, URINE   DRUG MONITOR, PANEL 5, W/CONF, URINE    No orders of the defined types were placed in this encounter.    Follow-Up Instructions: Return in about 5 months (around 09/17/2021) for Rheumatoid arthritis, Osteoarthritis.   Bo Merino, MD  Note - This record has been created using Editor, commissioning.  Chart creation errors have been sought, but may not always  have been located. Such creation errors do not reflect on  the standard of medical care.

## 2021-04-13 ENCOUNTER — Other Ambulatory Visit: Payer: Self-pay | Admitting: Rheumatology

## 2021-04-16 ENCOUNTER — Ambulatory Visit (INDEPENDENT_AMBULATORY_CARE_PROVIDER_SITE_OTHER): Payer: Medicare Other

## 2021-04-16 DIAGNOSIS — Z95810 Presence of automatic (implantable) cardiac defibrillator: Secondary | ICD-10-CM | POA: Diagnosis not present

## 2021-04-16 DIAGNOSIS — I5022 Chronic systolic (congestive) heart failure: Secondary | ICD-10-CM

## 2021-04-16 NOTE — Telephone Encounter (Signed)
Last Visit: 04/19/2021   Next Visit: 11/09/2020   UDS:11/09/2020 UDS is consistent with tramadol use.   Narc Agreement: 11/09/2020   Last Fill: 03/14/2021 tramadol 50mg  1 tablet by mouth at bedtime as needed.   Okay to refill Tramadol?

## 2021-04-17 ENCOUNTER — Encounter: Payer: Self-pay | Admitting: Gastroenterology

## 2021-04-17 ENCOUNTER — Ambulatory Visit (AMBULATORY_SURGERY_CENTER): Payer: Medicare Other | Admitting: Gastroenterology

## 2021-04-17 VITALS — BP 121/63 | HR 86 | Temp 98.9°F | Resp 14 | Ht 66.0 in | Wt 192.0 lb

## 2021-04-17 DIAGNOSIS — K222 Esophageal obstruction: Secondary | ICD-10-CM

## 2021-04-17 DIAGNOSIS — D135 Benign neoplasm of extrahepatic bile ducts: Secondary | ICD-10-CM

## 2021-04-17 DIAGNOSIS — K449 Diaphragmatic hernia without obstruction or gangrene: Secondary | ICD-10-CM | POA: Diagnosis not present

## 2021-04-17 MED ORDER — SODIUM CHLORIDE 0.9 % IV SOLN: 500.0000 mL | Freq: Once | INTRAVENOUS | Status: DC

## 2021-04-17 NOTE — Patient Instructions (Addendum)
Handout was given to your care partner on a Hiatal Hernia. Restart ELIQUIS today at prior dose. You may resume your other current medications today. Continue with plans for repeat ERCP in about three months with Dr. Rush Landmark to remove the biliary stent and check for residual adenoma.  The office will call you to schedule this procedure. Please call if any questions or concerns.    YOU HAD AN ENDOSCOPIC PROCEDURE TODAY AT [facility]:   Refer to the procedure report that was given to you for any specific questions about what was found during the examination.  If the procedure report does not answer your questions, please call your gastroenterologist to clarify.  If you requested that your care partner not be given the details of your procedure findings, then the procedure report has been included in a sealed envelope for you to review at your convenience later.  YOU SHOULD EXPECT: Some feelings of bloating in the abdomen. Passage of more gas than usual.  Walking can help get rid of the air that was put into your GI tract during the procedure and reduce the bloating. If you had a lower endoscopy (such as a colonoscopy or flexible sigmoidoscopy) you may notice spotting of blood in your stool or on the toilet paper. If you underwent a bowel prep for your procedure, you may not have a normal bowel movement for a few days.  Please Note:  You might notice some irritation and congestion in your nose or some drainage.  This is from the oxygen used during your procedure.  There is no need for concern and it should clear up in a day or so.  SYMPTOMS TO REPORT IMMEDIATELY:   Following upper endoscopy (EGD)  Vomiting of blood or coffee ground material  New chest pain or pain under the shoulder blades  Painful or persistently difficult swallowing  New shortness of breath  Fever of 100F or higher  Black, tarry-looking stools  For urgent or emergent issues, a gastroenterologist can be  reached at any hour by calling [phone]. Do not use MyChart messaging for urgent concerns.    DIET:  We do recommend a small meal at first, but then you may proceed to your regular diet.  Drink plenty of fluids but you should avoid alcoholic beverages for 24 hours.  ACTIVITY:  You should plan to take it easy for the rest of today and you should NOT DRIVE or use heavy machinery until tomorrow (because of the sedation medicines used during the test).    FOLLOW UP: Our staff will call the number listed on your records 48-72 hours following your procedure to check on you and address any questions or concerns that you may have regarding the information given to you following your procedure. If we do not reach you, we will leave a message.  We will attempt to reach you two times.  During this call, we will ask if you have developed any symptoms of COVID 19. If you develop any symptoms (ie: fever, flu-like symptoms, shortness of breath, cough etc.) before then, please call [phone].  If you test positive for Covid 19 in the 2 weeks post procedure, please call and report this information to [place].    If any biopsies were taken you will be contacted by phone or by letter within the next 1-3 weeks.  Please call us at [phone] if you have not heard about the biopsies in 3 weeks.    SIGNATURES/CONFIDENTIALITY: You and/or  your care partner have signed paperwork which will be entered into your electronic medical record.  These signatures attest to the fact that that the information above on your After Visit Summary has been reviewed and is understood.  Full responsibility of the confidentiality of this discharge information lies with you and/or your care-partner.

## 2021-04-17 NOTE — Progress Notes (Signed)
A and O x3. Report to RN. Tolerated MAC anesthesia well.Teeth unchanged after procedure. 

## 2021-04-17 NOTE — Op Note (Signed)
[facility] Patient Name: Dravyn Severs Procedure Date: 04/17/2021 3:22 PM MRN: [id number] Endoscopist: [name] , MD Age: 75 Referring MD:  Date of Birth: 1946/05/13 Gender: Male Account #: [id number] Procedure:                Upper GI endoscopy Indications:              Pancreatic duct stent removal Medicines:                Monitored Anesthesia Care Procedure:                Pre-Anesthesia Assessment:                           - Prior to the procedure, a History and Physical                            was performed, and patient medications and                            allergies were reviewed. The patient's tolerance of                            previous anesthesia was also reviewed. The risks                            and benefits of the procedure and the sedation                            options and risks were discussed with the patient.                            All questions were answered, and informed consent                            was obtained. Prior Anticoagulants: The patient has                            taken Eliquis (apixaban), last dose was 3 days                            prior to procedure. ASA Grade Assessment: III - A                            patient with severe systemic disease. After                            reviewing the risks and benefits, the patient was                            deemed in satisfactory condition to undergo the                            procedure.  After obtaining informed consent, the endoscope was                            passed under direct vision. Throughout the                            procedure, the patient's blood pressure, pulse, and                            oxygen saturations were monitored continuously. The                            GIF [id number] #[id number] was introduced through the                            mouth, and advanced to the second part of duodenum.                             The upper GI endoscopy was accomplished without                            difficulty. The patient tolerated the procedure                            well. Scope In: Scope Out: Findings:                 Small hiatal hernia with minor, non-obstructing                            Schatzki's ring.                           Several soft polyps throughout the stomach                           There were two stents extending across the major                            papilla into the duodenum. I removed the pancreatic                            stent with a snare. The stent fragmented during                            removal however the remaining piece was suctioned                            out through the endoscope.                           The exam was otherwise without abnormality. Complications:            No immediate complications. Estimated blood loss:  None. Estimated Blood Loss:     Estimated blood loss: none. Impression:               - Small hiatal hernia with minor, non-obstructing                            Schatzki's ring.                           - Several soft polyps throughout the stomach                           - There were two stents extending across the major                            papilla into the duodenum. I removed the pancreatic                            stent with a snare. The stent fragmented during                            removal however the remaining piece was suctioned                            out through the endoscope.                           - The examination was otherwise normal. Recommendation:           - Patient has a contact number available for                            emergencies. The signs and symptoms of potential                            delayed complications were discussed with the                            patient. Return to normal activities tomorrow.                            Written  discharge instructions were provided to the                            patient.                           - Resume previous diet.                           - Continue present medications. It is safe to                            resume your eliquis today                           -  Continue with plans for repeat ERCP in about 3                            months with Dr. Rush Landmark to remove the biliary                            stent and check for residual adenoma. [name], MD 04/17/2021 3:54:02 PM This report has been signed electronically.

## 2021-04-17 NOTE — Progress Notes (Signed)
EPIC Encounter for ICM Monitoring  Patient Name: [name] is a 75 y.o. male Date: 04/17/2021 Primary Care Physican: Biagio Borg, MD Primary Cardiologist: Burt Knack Electrophysiologist: Curt Bears Bi-V Pacing:   97.4%  Effective 90.9%      Last Weight: 197 lbs     Time in AT/AF  0.0 hr/day (0.0%)                                                          Spoke with wife (per DPR) due to patient is currently having endoscopy at time of call.  Hospitalization 11/3-11/5 for GI bleed per chart review.   Optivol thoracic impedance trending just below baseline normal suggesting possible fluid accumulation since 11/7.    Takes Spirolactone 25 mg take 0.5 tablet daily.   Recommendations:  Will recheck fluid levels next week.   Follow-up plan: ICM clinic phone appointment on 04/23/2021 to recheck fluid levels.   91 day device clinic remote transmission 05/03/2021.     EP/Cardiology Office Visits:  Recall 06/21/2021 with Dr Burt Knack.  Recall 07/05/2021 with Dr. Curt Bears.     Copy of ICM check sent to Dr. Curt Bears and Dr Burt Knack.     3 month ICM trend: 04/16/2021.    12-14 Month ICM trend:       [name], RN 04/17/2021 2:56 PM

## 2021-04-17 NOTE — Progress Notes (Signed)
  The recent H&P (dated 03/23/2021) was reviewed, the patient was examined and there is no change in the patients condition since that H&P was completed.   [name]  04/17/2021, 3:27 PM

## 2021-04-17 NOTE — Progress Notes (Signed)
No problems noted in the recovery room. maw 

## 2021-04-17 NOTE — Progress Notes (Signed)
Check-in-AM  V/S-CW

## 2021-04-18 NOTE — Progress Notes (Signed)
Received: Today Billy Mocha, MD  [name], RN; Theodoro Parma, RN Will get him back in for a hospital follow-up visit. He might need evaluation for Watchman to avoid long-term anticoagulation. thanks

## 2021-04-19 ENCOUNTER — Telehealth: Payer: Self-pay | Admitting: *Deleted

## 2021-04-19 ENCOUNTER — Other Ambulatory Visit: Payer: Self-pay

## 2021-04-19 ENCOUNTER — Encounter: Payer: Self-pay | Admitting: Rheumatology

## 2021-04-19 ENCOUNTER — Other Ambulatory Visit: Payer: Self-pay | Admitting: *Deleted

## 2021-04-19 ENCOUNTER — Ambulatory Visit (INDEPENDENT_AMBULATORY_CARE_PROVIDER_SITE_OTHER): Payer: Medicare Other | Admitting: Rheumatology

## 2021-04-19 VITALS — BP 131/77 | HR 73 | Ht 68.0 in | Wt 194.0 lb

## 2021-04-19 DIAGNOSIS — Z950 Presence of cardiac pacemaker: Secondary | ICD-10-CM

## 2021-04-19 DIAGNOSIS — M19071 Primary osteoarthritis, right ankle and foot: Secondary | ICD-10-CM | POA: Diagnosis not present

## 2021-04-19 DIAGNOSIS — Z96651 Presence of right artificial knee joint: Secondary | ICD-10-CM

## 2021-04-19 DIAGNOSIS — M1712 Unilateral primary osteoarthritis, left knee: Secondary | ICD-10-CM | POA: Diagnosis not present

## 2021-04-19 DIAGNOSIS — I255 Ischemic cardiomyopathy: Secondary | ICD-10-CM

## 2021-04-19 DIAGNOSIS — Z86010 Personal history of colonic polyps: Secondary | ICD-10-CM

## 2021-04-19 DIAGNOSIS — Z87438 Personal history of other diseases of male genital organs: Secondary | ICD-10-CM

## 2021-04-19 DIAGNOSIS — Z8669 Personal history of other diseases of the nervous system and sense organs: Secondary | ICD-10-CM

## 2021-04-19 DIAGNOSIS — Z79899 Other long term (current) drug therapy: Secondary | ICD-10-CM | POA: Diagnosis not present

## 2021-04-19 DIAGNOSIS — M533 Sacrococcygeal disorders, not elsewhere classified: Secondary | ICD-10-CM

## 2021-04-19 DIAGNOSIS — R29890 Loss of height: Secondary | ICD-10-CM | POA: Diagnosis not present

## 2021-04-19 DIAGNOSIS — G8929 Other chronic pain: Secondary | ICD-10-CM

## 2021-04-19 DIAGNOSIS — M19072 Primary osteoarthritis, left ankle and foot: Secondary | ICD-10-CM

## 2021-04-19 DIAGNOSIS — M7130 Other bursal cyst, unspecified site: Secondary | ICD-10-CM | POA: Diagnosis not present

## 2021-04-19 DIAGNOSIS — M0579 Rheumatoid arthritis with rheumatoid factor of multiple sites without organ or systems involvement: Secondary | ICD-10-CM

## 2021-04-19 DIAGNOSIS — M461 Sacroiliitis, not elsewhere classified: Secondary | ICD-10-CM | POA: Diagnosis not present

## 2021-04-19 DIAGNOSIS — Z8719 Personal history of other diseases of the digestive system: Secondary | ICD-10-CM

## 2021-04-19 DIAGNOSIS — Z8679 Personal history of other diseases of the circulatory system: Secondary | ICD-10-CM | POA: Diagnosis not present

## 2021-04-19 DIAGNOSIS — M5136 Other intervertebral disc degeneration, lumbar region: Secondary | ICD-10-CM | POA: Diagnosis not present

## 2021-04-19 DIAGNOSIS — Z8639 Personal history of other endocrine, nutritional and metabolic disease: Secondary | ICD-10-CM

## 2021-04-19 DIAGNOSIS — M19042 Primary osteoarthritis, left hand: Secondary | ICD-10-CM

## 2021-04-19 DIAGNOSIS — M19041 Primary osteoarthritis, right hand: Secondary | ICD-10-CM | POA: Diagnosis not present

## 2021-04-19 NOTE — Progress Notes (Signed)
Scheduled patient for evaluation with Dr. Burt Knack 04/26/2021. He was grateful for call and agrees with plan.

## 2021-04-19 NOTE — Telephone Encounter (Signed)
  Follow up Call-  Call back number 04/17/2021 12/26/2020 01/14/2020  Post procedure Call Back phone  # [phone]  Permission to leave phone message Yes Yes Yes  Some recent data might be hidden     Patient questions:  Do you have a fever, pain , or abdominal swelling? No. Pain Score  0 *  Have you tolerated food without any problems? Yes.    Have you been able to return to your normal activities? Yes.    Do you have any questions about your discharge instructions: Diet   No. Medications  No. Follow up visit  No.  Do you have questions or concerns about your Care? No.  Actions: * If pain score is 4 or above: No action needed, pain <4.  Have you developed a fever since your procedure? no  2.   Have you had an respiratory symptoms (SOB or cough) since your procedure? no  3.   Have you tested positive for COVID 19 since your procedure no  4.   Have you had any family members/close contacts diagnosed with the COVID 19 since your procedure?  no   If yes to any of these questions please route to Joylene [name], RN and Joella Prince, RN

## 2021-04-20 ENCOUNTER — Telehealth: Payer: Self-pay | Admitting: *Deleted

## 2021-04-20 NOTE — Telephone Encounter (Signed)
error 

## 2021-04-21 LAB — DRUG MONITOR, TRAMADOL,QN, URINE
Desmethyltramadol: 2349 ng/mL — ABNORMAL HIGH (ref <100)
Tramadol: 2315 ng/mL — ABNORMAL HIGH (ref <100)

## 2021-04-21 LAB — DRUG MONITOR, PANEL 5, W/CONF, URINE
Amphetamines: NEGATIVE ng/mL (ref <500)
Barbiturates: NEGATIVE ng/mL (ref <300)
Benzodiazepines: NEGATIVE ng/mL (ref <100)
Cocaine Metabolite: NEGATIVE ng/mL (ref <150)
Creatinine: 117.5 mg/dL (ref >=20.0)
Marijuana Metabolite: NEGATIVE ng/mL (ref <20)
Methadone Metabolite: NEGATIVE ng/mL (ref <100)
Opiates: NEGATIVE ng/mL (ref <100)
Oxidant: NEGATIVE ug/mL (ref <200)
Oxycodone: NEGATIVE ng/mL (ref <100)
pH: 5.7 (ref 4.5–9.0)

## 2021-04-21 LAB — DM TEMPLATE

## 2021-04-21 NOTE — Progress Notes (Signed)
UDS consistent with tramadol use.

## 2021-04-23 ENCOUNTER — Ambulatory Visit (INDEPENDENT_AMBULATORY_CARE_PROVIDER_SITE_OTHER): Payer: Medicare Other

## 2021-04-23 ENCOUNTER — Telehealth: Payer: Self-pay | Admitting: *Deleted

## 2021-04-23 DIAGNOSIS — Z95810 Presence of automatic (implantable) cardiac defibrillator: Secondary | ICD-10-CM

## 2021-04-23 DIAGNOSIS — I5022 Chronic systolic (congestive) heart failure: Secondary | ICD-10-CM

## 2021-04-23 NOTE — Telephone Encounter (Signed)
Left message to advise patient Serum creatinine is normal.  It is not concerning to see elevated urine creatinine.

## 2021-04-23 NOTE — Telephone Encounter (Signed)
Patient states he was concerned about the Creatinine results in the urine drug screen. Patient states it was 117.5. Patient would like to know if he should be concerned. Please advise.

## 2021-04-23 NOTE — Telephone Encounter (Signed)
Serum creatinine is normal.  It is not concerning to see elevated urine creatinine.

## 2021-04-24 ENCOUNTER — Telehealth: Payer: Self-pay | Admitting: *Deleted

## 2021-04-24 DIAGNOSIS — R29890 Loss of height: Secondary | ICD-10-CM

## 2021-04-24 DIAGNOSIS — M8580 Other specified disorders of bone density and structure, unspecified site: Secondary | ICD-10-CM

## 2021-04-24 DIAGNOSIS — M8589 Other specified disorders of bone density and structure, multiple sites: Secondary | ICD-10-CM

## 2021-04-24 NOTE — Telephone Encounter (Signed)
Received results of bone density scan. Results were from 2016. Per Hazel Sams, PA-C, patient will need to update bone density scan. Left message to advise patient and order placed.

## 2021-04-24 NOTE — Progress Notes (Signed)
EPIC Encounter for ICM Monitoring  Patient Name: [name] is a 75 y.o. male Date: 04/24/2021 Primary Care Physican: Biagio Borg, MD Primary Cardiologist: Burt Knack Electrophysiologist: Curt Bears Bi-V Pacing:   97.8%  Effective 87.3%      Last Weight: 197 lbs     Time in AT/AF  0.0 hr/day (0.0%)                                                          Spoke with patient and heart failure questions reviewed.  Pt asymptomatic for fluid accumulation and feeling well   Optivol thoracic impedance suggesting fluid levels returned to normal.    Takes Spirolactone 25 mg take 0.5 tablet daily.   Recommendations:  No changes and encouraged to call if experiencing any fluid symptoms.   Follow-up plan: ICM clinic phone appointment on 05/28/2021.   91 day device clinic remote transmission 05/03/2021.     EP/Cardiology Office Visits:  04/26/2021 with Dr Burt Knack.  Recall 07/05/2021 with Dr. Curt Bears.     Copy of ICM check sent to Dr. Curt Bears.     3 month ICM trend: 04/23/2021.    12-14 Month ICM trend:       [name], RN 04/24/2021 3:17 PM

## 2021-04-24 NOTE — Telephone Encounter (Signed)
Patient asked about his Tramadol refill. Patient advised prescription was sent on 04/16/2021. Patient asked [place] to contact the pharmacy regarding this prescription.  Contacted [facility] on [address] and spoke with the pharmacist, He states the prescription was received on 04/16/2021 and the patient picked up the prescription on 04/16/2021. Attempted to contact the patient and left message to advise patient. Advised patient we would be unable to refill until 05/16/2021.

## 2021-04-25 ENCOUNTER — Telehealth: Payer: Self-pay | Admitting: Gastroenterology

## 2021-04-25 NOTE — Telephone Encounter (Signed)
Inbound call from patient stating that his dentist is wanting him to take antibiotics. He says he has had 4 Endos in the last 2 months and feels uneasy about taking antibiotics. Seeking advice. Please advise.

## 2021-04-25 NOTE — Telephone Encounter (Signed)
Dr Ardis Hughs the pt has an upcoming dental appt and has been asked to take abx prior because of a knee replacement.  The pt is concerned because he has had several EGD's recently and is not sure if it is a good idea or not.  Please advise

## 2021-04-25 NOTE — Telephone Encounter (Signed)
The pt has been advised and thanked me for the return call

## 2021-04-26 ENCOUNTER — Other Ambulatory Visit: Payer: Self-pay

## 2021-04-26 ENCOUNTER — Ambulatory Visit (INDEPENDENT_AMBULATORY_CARE_PROVIDER_SITE_OTHER): Payer: Medicare Other | Admitting: Cardiovascular Disease

## 2021-04-26 ENCOUNTER — Encounter: Payer: Self-pay | Admitting: Cardiovascular Disease

## 2021-04-26 VITALS — BP 110/64 | HR 80 | Ht 67.0 in | Wt 192.0 lb

## 2021-04-26 DIAGNOSIS — I480 Paroxysmal atrial fibrillation: Secondary | ICD-10-CM

## 2021-04-26 DIAGNOSIS — E782 Mixed hyperlipidemia: Secondary | ICD-10-CM

## 2021-04-26 DIAGNOSIS — I2510 Atherosclerotic heart disease of native coronary artery without angina pectoris: Secondary | ICD-10-CM | POA: Diagnosis not present

## 2021-04-26 DIAGNOSIS — I5022 Chronic systolic (congestive) heart failure: Secondary | ICD-10-CM

## 2021-04-26 DIAGNOSIS — I255 Ischemic cardiomyopathy: Secondary | ICD-10-CM | POA: Diagnosis not present

## 2021-04-26 NOTE — Patient Instructions (Signed)
Medication Instructions:  Your physician recommends that you continue on your current medications as directed. Please refer to the Current Medication list given to you today.  *If you need a refill on your cardiac medications before your next appointment, please call your pharmacy*   Lab Work: NONE If you have labs (blood work) drawn today and your tests are completely normal, you will receive your results only by: [place] (if you have MyChart) OR A paper copy in the mail If you have any lab test that is abnormal or we need to change your treatment, we will call you to review the results.   Testing/Procedures: NONE   Follow-Up: At [facility], you and your health needs are our priority.  As part of our continuing mission to provide you with exceptional heart care, we have created designated Provider Care Teams.  These Care Teams include your primary Cardiologist (physician) and Advanced Practice Providers (APPs -  Physician Assistants and Nurse Practitioners) who all work together to provide you with the care you need, when you need it.  We recommend signing up for the patient portal called "MyChart".  Sign up information is provided on this After Visit Summary.  MyChart is used to connect with patients for Virtual Visits (Telemedicine).  Patients are able to view lab/test results, encounter notes, upcoming appointments, etc.  Non-urgent messages can be sent to your provider as well.   To learn more about what you can do with MyChart, go to [email or web address].    Your next appointment:   6 month(s)  The format for your next appointment:   In Person  Provider:   [name], MD

## 2021-04-26 NOTE — Progress Notes (Signed)
Cardiology Office Note:    Date:  04/26/2021   ID:  ENMANUEL ZUFALL, DOB 01-Apr-1946, MRN [id number]  PCP:  Biagio Borg, MD   [facility] Providers Cardiologist:  [name], MD Electrophysiologist:  Constance Haw, MD     Referring MD: Biagio Borg, MD   Chief Complaint  Patient presents with   Atrial Fibrillation    History of Present Illness:    [name] is a 75 y.o. male with a hx of chronic systolic heart failure with left bundle branch block, coronary artery disease with remote stenting of the RCA, and paroxysmal atrial fibrillation.  He presents for follow-up evaluation today.  He underwent CRT-D implantation in 2018.  He was ultimately found to have atrial fibrillation on remote device monitoring and was started on anticoagulation with apixaban.  His antiplatelet therapy was stopped at that time.  LV function normalized after cardiac resynchronization.  The patient is here alone today.  We were notified that he may have had gastrointestinal bleeding and he is brought in for a cardiology follow-up visit.  It turns out he has had multiple polyps and underwent EUS/ERCP with ampullectomy for treatment of ampullary adenoma.  Stents were placed as well.  He developed melena and was rehospitalized.  The patient was off of his apixaban at the time.  He has had multiple endoscopies over the last several months.  He denies any bleeding problems other than his one episode.  He did not require packed red blood cell transfusion.  He has had no chest pain, chest pressure, leg edema, orthopnea, or PND.  No heart palpitations, lightheadedness, or syncope.  He does have exertional dyspnea and attributes this to weight gain.  He is short of breath with climbing 1 flight of stairs.  No other complaints.  Past Medical History:  Diagnosis Date   AICD (automatic cardioverter/defibrillator) present    Allergic rhinitis    Allergy    Anginal pain ([place]) 05/26/14   chest pain after  chasing dog   Atypical nevus of back 04/27/2003   moderate - mid lower back   Basal cell carcinoma 01/26/2008   right cheek - MOHs   CAD (coronary artery disease)    hx of stent- 2005 RCA   Cataract    beginning stage both eyes   CHF (congestive heart failure) (HCC)    pacemaker Medtronic     Chronic back pain    intermittent   Constipation    Dizziness    Dysrhythmia    right bundle branch block    Esophageal stricture    GERD (gastroesophageal reflux disease)    Hemoptysis    Hiatal hernia    History of colonic polyps    hyperplastic   HTN (hypertension)    Hyperlipidemia    Hypertrophy of prostate with urinary obstruction and other lower urinary tract symptoms (LUTS)    OA (osteoarthritis)    OSA (obstructive sleep apnea)    cpap- 10    Other specified disorder of stomach and duodenum    duodenal periampulary tubulovillous adenoma removed by Dr. Ardis Hughs 5/10   Pericarditis 07/06/2019   Pneumonia    Pre-diabetes    no medications   Shortness of breath    with exertion   Sleep apnea    cpap   Testicular hypofunction     Past Surgical History:  Procedure Laterality Date   BILIARY STENT PLACEMENT N/A 03/19/2021   Procedure: BILIARY STENT PLACEMENT;  Surgeon: Justice Britain  Brooke Bonito., MD;  Location: [facility];  Service: Gastroenterology;  Laterality: N/A;   BIV ICD INSERTION CRT-D N/A 03/27/2017   Procedure: BIV ICD INSERTION CRT-D;  Surgeon: Constance Haw, MD;  Location: [facility];  Service: Cardiovascular;  Laterality: N/A;   CARDIAC CATHETERIZATION     '05, last 2009, showing patent RCA stent   COLONOSCOPY  12/2007   HYPERPLASTIC POLYP   COLONOSCOPY  12/2019   COLONOSCOPY WITH PROPOFOL N/A 06/02/2014   Procedure: COLONOSCOPY WITH PROPOFOL;  Surgeon: [name], MD;  Location: [facility];  Service: Endoscopy;  Laterality: N/A;   CORONARY ANGIOPLASTY  08/2003   ENDOSCOPIC MUCOSAL RESECTION  03/19/2021   Procedure: ENDOSCOPIC MUCOSAL  RESECTION, ampullectomy;  Surgeon: Irving Copas., MD;  Location: [facility];  Service: Gastroenterology;;   ENDOSCOPIC RETROGRADE CHOLANGIOPANCREATOGRAPHY (ERCP) WITH PROPOFOL N/A 03/19/2021   Procedure: ENDOSCOPIC RETROGRADE CHOLANGIOPANCREATOGRAPHY (ERCP) WITH PROPOFOL;  Surgeon: Irving Copas., MD;  Location: [facility];  Service: Gastroenterology;  Laterality: N/A;   ESOPHAGOGASTRODUODENOSCOPY (EGD) WITH PROPOFOL N/A 06/02/2014   Procedure: ESOPHAGOGASTRODUODENOSCOPY (EGD) WITH PROPOFOL;  Surgeon: [name], MD;  [facility];  Service: Endoscopy;  Laterality: N/A;   ESOPHAGOGASTRODUODENOSCOPY (EGD) WITH PROPOFOL N/A 03/19/2021   Procedure: ESOPHAGOGASTRODUODENOSCOPY (EGD) WITH PROPOFOL;  Surgeon: Rush Landmark Telford Nab., MD;  [facility];  Service: Gastroenterology;  Laterality: N/A;   ESOPHAGOGASTRODUODENOSCOPY (EGD) WITH PROPOFOL N/A 03/23/2021   Procedure: ESOPHAGOGASTRODUODENOSCOPY (EGD) WITH PROPOFOL;  Surgeon: Ladene Artist, MD;  [facility]: [facility];  Service: Endoscopy;  Laterality: N/A;   hernia surgery x 3     Bilateral Inguinal, Umbicial   INSERTION OF MESH N/A 03/20/2018   Procedure: INSERTION OF MESH;  Surgeon: Alphonsa Overall, MD;  Location: [place];  Service: General;  Laterality: N/A;   IRRIGATION AND DEBRIDEMENT ABSCESS Left 08/14/2012   Procedure: IRRIGATION AND DEBRIDEMENT LEFT INGUINAL BOIL ;  Surgeon: Ailene Rud, MD;  [facility]: [facility];  Service: Urology;  Laterality: Left;   JOINT REPLACEMENT Right 2017   KNEE ARTHROPLASTY     LEFT HEART CATH AND CORONARY ANGIOGRAPHY N/A 01/24/2019   Procedure: LEFT HEART CATH AND CORONARY ANGIOGRAPHY;  Surgeon: Martinique, Peter M, MD;  Location: [place] [facility];  Service: Cardiovascular;  Laterality: N/A;   LEFT HEART CATHETERIZATION WITH CORONARY ANGIOGRAM N/A 05/26/2014   Procedure: LEFT HEART CATHETERIZATION WITH CORONARY ANGIOGRAM;  Surgeon: Blane Ohara, MD;  Location: [facility]  CATH LAB;  Service: Cardiovascular;  Laterality: N/A;   LUMBAR LAMINECTOMY/DECOMPRESSION MICRODISCECTOMY Left 09/26/2014   Procedure: Lumbar Laminectomy for resection of synovial cyst  Lumbar five- sacral one left;  Surgeon: Kary Kos, MD;  Location: [facility] [facility];  Service: Neurosurgery;  Laterality: Left;   MOLE REMOVAL Left 03/20/2018   Procedure: MOLE REMOVAL;  Surgeon: Alphonsa Overall, MD;  Location: [place];  Service: General;  Laterality: Left;   Oral surg to removed growth from ?sinus  10/2010   ?Dermoid removed by DrRiggs   PACEMAKER INSERTION  04/03/2017   PANCREATIC STENT PLACEMENT  03/19/2021   Procedure: PANCREATIC STENT PLACEMENT;  Surgeon: Irving Copas., MD;  Location: [facility];  Service: Gastroenterology;;   POLYPECTOMY     RCA stenting     '05 RCA   right toe surgery  Right    Cyst    RIGHT/LEFT HEART CATH AND CORONARY ANGIOGRAPHY N/A 01/15/2017   Procedure: RIGHT/LEFT HEART CATH AND CORONARY ANGIOGRAPHY;  Surgeon: [name], MD;  Location: [place] [facility];  Service: Cardiovascular;  Laterality: N/A;   s/p right knee arthroscopy  2005   SPHINCTEROTOMY  03/19/2021   Procedure: SPHINCTEROTOMY;  Surgeon: Mansouraty, Telford Nab., MD;  Location: [facility];  Service: Gastroenterology;;   [place] INJECTION  03/19/2021   Procedure: SUBMUCOSAL LIFTING INJECTION;  Surgeon: Irving Copas., MD;  Location: [facility];  Service: Gastroenterology;;   thumb surgery Right    TOTAL KNEE ARTHROPLASTY Right 03/12/2016   Procedure: RIGHT TOTAL KNEE ARTHROPLASTY;  Surgeon: Paralee Cancel, MD;  Location: [facility];  Service: Orthopedics;  Laterality: Right;   UPPER ESOPHAGEAL ENDOSCOPIC ULTRASOUND (EUS) N/A 03/19/2021   Procedure: UPPER ESOPHAGEAL ENDOSCOPIC ULTRASOUND (EUS);  Surgeon: Irving Copas., MD;  Location: [facility];  Service: Gastroenterology;  Laterality: N/A;   UPPER GASTROINTESTINAL ENDOSCOPY     VENTRAL HERNIA REPAIR N/A 03/20/2018    Procedure: [place];  Surgeon: Alphonsa Overall, MD;  Location: [place];  Service: General;  Laterality: N/A;    Current Medications: Current Meds  Medication Sig   acetaminophen (TYLENOL) 650 MG CR tablet Take 650 mg by mouth in the morning and at bedtime.   Apixaban (ELIQUIS PO) Take 5 mg by mouth in the morning and at bedtime.   atorvastatin (LIPITOR) 20 MG tablet TAKE 1 TABLET BY MOUTH DAILY (Patient taking differently: Take 20 mg by mouth daily.)   b complex vitamins tablet Take 1 tablet by mouth daily with lunch.   carvedilol (COREG) 25 MG tablet TAKE 1 TABLET BY MOUTH 2 TIMES DAILY. GENERIC EQUIVALENT FOR COREG. (Patient taking differently: Take 25 mg by mouth in the morning and at bedtime.)   cholecalciferol (VITAMIN D3) 25 MCG (1000 UNIT) tablet Take 1,000 Units by mouth daily.   Coenzyme Q10 (CO Q 10 PO) Take 200 mg by mouth daily with lunch.   diclofenac sodium (VOLTAREN) 1 % GEL Apply 1 application topically 4 (four) times daily as needed (pain).    ENTRESTO 49-51 MG TAKE 1 TABLET BY MOUTH TWICE DAILY (Patient taking differently: Take 1 tablet by mouth 2 (two) times daily.)   Flaxseed, Linseed, (FLAXSEED OIL PO) Take 1 capsule by mouth daily. With Omega 3   folic acid (FOLVITE) 654 MCG tablet Take 400 mcg by mouth daily.   Glucosamine HCl (GLUCOSAMINE PO) Take 1,500 mg by mouth every evening.   HYDROcodone-acetaminophen (NORCO/VICODIN) 5-325 MG tablet Take 1 tablet by mouth daily.   hydrocortisone 2.5 % cream Apply 1 application topically 2 (two) times daily as needed (rash on nose).    hydroxychloroquine (PLAQUENIL) 200 MG tablet TAKE 1 TABLET(200 MG) BY MOUTH DAILY (Patient taking differently: Take 200 mg by mouth See admin instructions. Mon thru Fri only)   Lutein 20 MG TABS Take 20 mg by mouth daily.   Multiple Vitamin (MULTIVITAMIN) capsule Take 1 capsule by mouth daily.     nabumetone (RELAFEN) 500 MG tablet Take 500 mg by mouth daily.    NON FORMULARY CPAP machine with sleep.   pantoprazole (PROTONIX) 40 MG tablet Take 1 tablet (40 mg total) by mouth 2 (two) times daily before a meal. TAKE 1 TABLET BY MOUTH EVERY DAY BEFORE BREAKFAST (Patient taking differently: Take 40 mg by mouth daily. TAKE 1 TABLET BY MOUTH EVERY DAY BEFORE BREAKFAST)   polyethylene glycol (MIRALAX / GLYCOLAX) 17 g packet Take 17 g by mouth daily as needed for mild constipation.   Probiotic Product (PROBIOTIC DAILY PO) Take 1 capsule by mouth daily with lunch.    RESTASIS 0.05 %  ophthalmic emulsion Place 1 drop into both eyes 2 (two) times daily.   Saw Palmetto, Serenoa repens, (SAW PALMETTO PO) Take 450 mg by mouth every evening.   spironolactone (ALDACTONE) 25 MG tablet TAKE 1/2 TABLET(12.5 MG) BY MOUTH DAILY (Patient taking differently: Take 12.5 mg by mouth daily.)   sucralfate (CARAFATE) 1 g tablet Take 1 tablet (1 g total) by mouth 4 (four) times daily. (Patient taking differently: Take 1 g by mouth daily.)   tamsulosin (FLOMAX) 0.4 MG CAPS capsule Take 0.4 mg by mouth daily.   Testosterone 20 % CREA Apply 2 mLs topically daily. Rub on shoulder   traMADol (ULTRAM) 50 MG tablet TAKE 1 TABLET(50 MG) BY MOUTH AT BEDTIME AS NEEDED   TURMERIC PO Take 1 tablet by mouth daily with lunch.    vitamin C (ASCORBIC ACID) 500 MG tablet Take 500 mg by mouth daily.   zinc gluconate 50 MG tablet Take 50 mg by mouth daily.     Allergies:   Sulfonamide derivatives   Social History   Socioeconomic History   Marital status: Married    Spouse name: Not on file   Number of children: 1   Years of education: Not on file   Highest education level: Not on file  Occupational History   Occupation: Charity fundraiser    Employer: [employer]  Tobacco Use   Smoking status: Former    Types: Cigarettes    Quit date: 05/20/1990    Years since quitting: 30.9   Smokeless tobacco: Never   Tobacco comments:    previous 30 pack year history  Vaping Use   Vaping Use:  Never used  Substance and Sexual Activity   Alcohol use: Yes    Comment: rare   Drug use: Never   Sexual activity: Not on file  Other Topics Concern   Not on file  Social History Narrative   Not on file   Social Determinants of Health   Financial Resource Strain: Not on file  Food Insecurity: Not on file  Transportation Needs: Not on file  Physical Activity: Not on file  Stress: Not on file  Social Connections: Not on file     Family History: The patient's family history includes Arthritis in his brother and daughter; COPD in his brother; Cancer in his brother; Coronary artery disease in his father; Diabetes in his father; Heart disease in his father and mother; Heart failure in his brother; Hypertension in an other family member; Stomach cancer in his paternal grandmother; Sudden death in his father. There is no history of Colon cancer, Esophageal cancer, Colon polyps, Ulcerative colitis, Liver disease, Pancreatic cancer, Inflammatory bowel disease, or Rectal cancer.  ROS:   Please see the history of present illness.    All other systems reviewed and are negative.  EKGs/Labs/Other Studies Reviewed:    The following studies were reviewed today: Echo 06/22/20: 1. Left ventricular ejection fraction, by estimation, is 50 to 55%. The  left ventricle has low normal function. The left ventricle has no regional  wall motion abnormalities. There is moderate concentric left ventricular  hypertrophy. Left ventricular  diastolic parameters are indeterminate.   2. Right ventricular systolic function is normal. The right ventricular  size is normal. There is normal pulmonary artery systolic pressure.   3. Left atrial size was mildly dilated.   4. The mitral valve is normal in structure. Mild mitral valve  regurgitation. No evidence of mitral stenosis.   5. The aortic valve is tricuspid.  There is mild calcification of the  aortic valve. Aortic valve regurgitation is not visualized. No  aortic  stenosis is present.   Comparison(s): No significant change from prior study.  EKG:  EKG is not ordered today.    Recent Labs: 03/24/2021: ALT 20; BUN 21; Creatinine, Ser 0.89; Hemoglobin 12.0; Magnesium 2.4; Platelets 236; Potassium 3.9; Sodium 138  Recent Lipid Panel    Component Value Date/Time   CHOL 97 01/24/2019 0725   CHOL 138 11/27/2016 0914   TRIG 161 (H) 01/24/2019 0725   HDL 27 (L) 01/24/2019 0725   HDL 35 (L) 11/27/2016 0914   CHOLHDL 3.6 01/24/2019 0725   VLDL 32 01/24/2019 0725   LDLCALC 38 01/24/2019 0725   LDLCALC 65 11/27/2016 0914     Risk Assessment/Calculations:    CHA2DS2-VASc Score = 5   This indicates a 7.2% annual risk of stroke. The patient's score is based upon: CHF History: 1 HTN History: 1 Diabetes History: 0 Stroke History: 0 Vascular Disease History: 1 Age Score: 2 Gender Score: 0          Physical Exam:    VS:  BP 110/64   Pulse 80   Ht 5\' 7"  (1.702 m)   Wt 192 lb (87.1 kg)   SpO2 97%   BMI 30.07 kg/m     Wt Readings from Last 3 Encounters:  04/26/21 192 lb (87.1 kg)  04/19/21 194 lb (88 kg)  04/17/21 192 lb (87.1 kg)     GEN:  Well nourished, well developed in no acute distress HEENT: Normal NECK: No JVD; No carotid bruits LYMPHATICS: No lymphadenopathy CARDIAC: RRR, no murmurs, rubs, gallops RESPIRATORY:  Clear to auscultation without rales, wheezing or rhonchi  ABDOMEN: Soft, non-tender, non-distended MUSCULOSKELETAL:  No edema; No deformity  SKIN: Warm and dry NEUROLOGIC:  Alert and oriented x 3 PSYCHIATRIC:  Normal affect   ASSESSMENT:    1. Chronic systolic heart failure (HCC)   2. Paroxysmal atrial fibrillation ([place])   3. Mixed hyperlipidemia   4. Coronary artery disease involving native coronary artery of native heart without angina pectoris    PLAN:    In order of problems listed above:  The patient appears clinically stable.  He remains on medical therapy with spironolactone, carvedilol,  and Entresto.  The patient's ejection fraction has improved on medical therapy with an LVEF of 50 to 55%.  He has New York Heart Association functional class II symptoms.  Discussed weight loss and lifestyle management today. Appears to be doing well.  He actually has tolerated apixaban well over time.  I was under the impression that he may have had some gastrointestinal bleeding but it sounds like this was limited to complex procedure.  He has otherwise had no problems.  We discussed consideration around watchman implantation as an alternative to long-term oral anticoagulation.  Patient has content with how he is doing on apixaban and will continue this.  If he develops bleeding or has more problematic GI issues, we can always consider watchman implantation down the road. Treated with atorvastatin 20 mg daily.  Last LDL cholesterol was 38. Doing well with no symptoms of angina.  Patient is considering left knee replacement early next year.  He is at low risk of cardiac complications and in fact has been stable from a cardiac standpoint now over the past few years.  He can proceed without further testing.           Medication Adjustments/Labs and Tests Ordered: Current medicines  are reviewed at length with the patient today.  Concerns regarding medicines are outlined above.  No orders of the defined types were placed in this encounter.  No orders of the defined types were placed in this encounter.   Patient Instructions  Medication Instructions:  Your physician recommends that you continue on your current medications as directed. Please refer to the Current Medication list given to you today.  *If you need a refill on your cardiac medications before your next appointment, please call your pharmacy*   Lab Work: NONE If you have labs (blood work) drawn today and your tests are completely normal, you will receive your results only by: [place] (if you have MyChart) OR A paper copy in  the mail If you have any lab test that is abnormal or we need to change your treatment, we will call you to review the results.   Testing/Procedures: NONE   Follow-Up: At [facility], you and your health needs are our priority.  As part of our continuing mission to provide you with exceptional heart care, we have created designated Provider Care Teams.  These Care Teams include your primary Cardiologist (physician) and Advanced Practice Providers (APPs -  Physician Assistants and Nurse Practitioners) who all work together to provide you with the care you need, when you need it.  We recommend signing up for the patient portal called "MyChart".  Sign up information is provided on this After Visit Summary.  MyChart is used to connect with patients for Virtual Visits (Telemedicine).  Patients are able to view lab/test results, encounter notes, upcoming appointments, etc.  Non-urgent messages can be sent to your provider as well.   To learn more about what you can do with MyChart, go to [email or web address].    Your next appointment:   6 month(s)  The format for your next appointment:   In Person  Provider:   [name], MD        Signed, [name], MD  04/26/2021 2:52 PM    [place]

## 2021-04-30 DIAGNOSIS — M461 Sacroiliitis, not elsewhere classified: Secondary | ICD-10-CM | POA: Diagnosis not present

## 2021-05-02 DIAGNOSIS — M85852 Other specified disorders of bone density and structure, left thigh: Secondary | ICD-10-CM | POA: Diagnosis not present

## 2021-05-02 DIAGNOSIS — M85851 Other specified disorders of bone density and structure, right thigh: Secondary | ICD-10-CM | POA: Diagnosis not present

## 2021-05-03 ENCOUNTER — Ambulatory Visit (INDEPENDENT_AMBULATORY_CARE_PROVIDER_SITE_OTHER): Payer: Medicare Other

## 2021-05-03 DIAGNOSIS — I255 Ischemic cardiomyopathy: Secondary | ICD-10-CM

## 2021-05-03 LAB — CUP PACEART REMOTE DEVICE CHECK
Battery Remaining Longevity: 27 mo
Battery Voltage: 2.95 V
Brady Statistic AP VP Percent: 0.25 %
Brady Statistic AP VS Percent: 0.01 %
Brady Statistic AS VP Percent: 98.22 %
Brady Statistic AS VS Percent: 1.52 %
Brady Statistic RA Percent Paced: 0.26 %
Brady Statistic RV Percent Paced: 42.42 %
Date Time Interrogation Session: 20221215012204
HighPow Impedance: 69 Ohm
Implantable Lead Implant Date: 20181108
Implantable Lead Implant Date: 20181108
Implantable Lead Implant Date: 20181108
Implantable Lead Location: 753858
Implantable Lead Location: 753859
Implantable Lead Location: 753860
Implantable Lead Model: 4298
Implantable Lead Model: 5076
Implantable Pulse Generator Implant Date: 20181108
Lead Channel Impedance Value: 1140 Ohm
Lead Channel Impedance Value: 1178 Ohm
Lead Channel Impedance Value: 1235 Ohm
Lead Channel Impedance Value: 204.14 Ohm
Lead Channel Impedance Value: 224.438
Lead Channel Impedance Value: 270.092
Lead Channel Impedance Value: 278.667
Lead Channel Impedance Value: 317.915
Lead Channel Impedance Value: 399 Ohm
Lead Channel Impedance Value: 418 Ohm
Lead Channel Impedance Value: 418 Ohm
Lead Channel Impedance Value: 513 Ohm
Lead Channel Impedance Value: 551 Ohm
Lead Channel Impedance Value: 589 Ohm
Lead Channel Impedance Value: 646 Ohm
Lead Channel Impedance Value: 665 Ohm
Lead Channel Impedance Value: 760 Ohm
Lead Channel Impedance Value: 836 Ohm
Lead Channel Pacing Threshold Amplitude: 0.375 V
Lead Channel Pacing Threshold Amplitude: 0.625 V
Lead Channel Pacing Threshold Amplitude: 2.75 V
Lead Channel Pacing Threshold Pulse Width: 0.4 ms
Lead Channel Pacing Threshold Pulse Width: 0.4 ms
Lead Channel Pacing Threshold Pulse Width: 1 ms
Lead Channel Sensing Intrinsic Amplitude: 12.875 mV
Lead Channel Sensing Intrinsic Amplitude: 12.875 mV
Lead Channel Sensing Intrinsic Amplitude: 2.5 mV
Lead Channel Sensing Intrinsic Amplitude: 2.5 mV
Lead Channel Setting Pacing Amplitude: 2 V
Lead Channel Setting Pacing Amplitude: 2.5 V
Lead Channel Setting Pacing Amplitude: 2.75 V
Lead Channel Setting Pacing Pulse Width: 0.4 ms
Lead Channel Setting Pacing Pulse Width: 1 ms
Lead Channel Setting Sensing Sensitivity: 0.3 mV

## 2021-05-04 ENCOUNTER — Telehealth: Payer: Self-pay

## 2021-05-04 NOTE — Telephone Encounter (Signed)
I do not know of any reason to avoid calcium and CHF.  Most of the calcium should come from dietary sources.  Patient should discuss this further with his PCP and cardiologist.

## 2021-05-04 NOTE — Telephone Encounter (Signed)
Attempted to contact patient and left message on machine to advise patient that per Dr. Estanislado Pandy, she does not know of any reason to avoid calcium and CHF.  Most of the calcium should come from dietary sources.  Patient should discuss this further with his PCP and cardiologist.

## 2021-05-04 NOTE — Telephone Encounter (Signed)
Received DEXA results from [facility].  Date of Scan: 05/02/2021  Lowest T-score:-1.6  BMD: 0.706  Lowest site measured: left femoral neck  Significant changes in BMD and site measured (5% and above): statistically significant increase in BMD of the lumbar spine and right total hip. Left total hip is stable.   Current Regimen: vitamin D  Recommendation: calcium, vitamin D and resistive exercises   Reviewed by: Hazel Sams, PA-C  Next Appointment:  10/06/2021  I called and advised patient of results and recommendations. Patient verbalized understanding.

## 2021-05-04 NOTE — Telephone Encounter (Signed)
I called patient and advised patient of DEXA results. Advised patient of the recommendations.   Patient states he has CHF and read that you shouldn't take calcium supplements with the heart medication. Patient would like your opinion on this. Please advise.

## 2021-05-07 ENCOUNTER — Encounter: Payer: Self-pay | Admitting: Cardiovascular Disease

## 2021-05-14 ENCOUNTER — Other Ambulatory Visit: Payer: Self-pay | Admitting: Cardiology

## 2021-05-14 DIAGNOSIS — I480 Paroxysmal atrial fibrillation: Secondary | ICD-10-CM

## 2021-05-15 NOTE — Progress Notes (Signed)
Remote ICD transmission.   

## 2021-05-15 NOTE — Telephone Encounter (Signed)
Eliquis 5mg  refill request received. Patient is 75 years old, weight-87.1kg, Crea-0.89 on 03/24/2021, Diagnosis-Afib, and last seen by Dr. Burt Knack on 04/26/2021. Dose is appropriate based on dosing criteria. Will send in refill to requested pharmacy.

## 2021-05-16 ENCOUNTER — Encounter: Payer: Self-pay | Admitting: Gastroenterology

## 2021-05-22 ENCOUNTER — Other Ambulatory Visit: Payer: Self-pay | Admitting: Rheumatology

## 2021-05-23 NOTE — Telephone Encounter (Signed)
Next Visit: 09/18/2021  Last Visit: 04/19/2021  UDS:04/19/2021 UDS consistent with tramadol use.  Narc Agreement: 04/19/2021  Last Fill: 04/16/2021  Okay to refill Tramadol?

## 2021-05-28 ENCOUNTER — Ambulatory Visit (INDEPENDENT_AMBULATORY_CARE_PROVIDER_SITE_OTHER): Payer: Medicare Other

## 2021-05-28 DIAGNOSIS — Z95810 Presence of automatic (implantable) cardiac defibrillator: Secondary | ICD-10-CM

## 2021-05-28 DIAGNOSIS — I5022 Chronic systolic (congestive) heart failure: Secondary | ICD-10-CM | POA: Diagnosis not present

## 2021-05-29 NOTE — Progress Notes (Signed)
Received: Today [name], MD  Toribio Seiber Panda, RN Thanks for letting me know

## 2021-05-29 NOTE — Progress Notes (Signed)
EPIC Encounter for ICM Monitoring  Patient Name: [name] is a 76 y.o. male Date: 05/29/2021 Primary Care Physican: Biagio Borg, MD Primary Cardiologist: Burt Knack Electrophysiologist: Curt Bears Bi-V Pacing:   94.8%  Effective 92.8%      Last Weight: 197 lbs    04/23/2021 Report: Time in AT/AF  0.0 hr/day (0.0%)  05/28/2021 Report: AT/AF 2 Time in AT/AF 1.4 hr/day (6.0%) Taking Eliquis Longest AT/AF 31 hours VT-NS (>4 beats, >200 bpm) 1                                                          Spoke with patient and heart failure questions reviewed.  Pt asymptomatic for fluid accumulation.  He was aware he went into Afib on 1/3 because of all the sudden he felt very weak and extremely tired.   Optivol thoracic impedance suggesting normal fluid levels.   Message sent 05/29/2021 to device clinic to review increased burden.     Takes Spirolactone 25 mg take 0.5 tablet daily.   Recommendations:  No changes and encouraged to call if experiencing any fluid symptoms.   Follow-up plan: ICM clinic phone appointment on 07/02/2021.   91 day device clinic remote transmission 11/01/2021.       EP/Cardiology Office Visits:  10/22/2021 with Dr Burt Knack.  Recall 07/05/2021 with Dr. Curt Bears.  06/21/2021 with Alusio regarding knee replacement.    Copy of ICM check sent to Dr. Curt Bears.  Copy sent to Dr Burt Knack as Juluis Rainier regarding Afib at patient's request.   AT/AF    3 month ICM trend: 05/28/2021.    12-14 Month ICM trend:     [name], RN 05/29/2021 3:51 PM

## 2021-05-30 ENCOUNTER — Telehealth: Payer: Self-pay

## 2021-05-30 ENCOUNTER — Other Ambulatory Visit: Payer: Self-pay | Admitting: Cardiovascular Disease

## 2021-05-30 NOTE — Telephone Encounter (Signed)
-----   Message from [name], RN sent at 05/29/2021  4:08 PM EST ----- Regarding: Increaesd AT/AF burden taking eliquis Would you please review Care[facility] 1/9 report and discuss with Dr Curt Bears if needed.    AT/AF Burden went from 0 to 6%.  He was symptomatic with tired and weakness when he went into afib on 1/3 but after 30 minutes felt okay.  He would like to know if there is anything he needs to be doing to keep from going into afib again.   Thank you. Margarita Grizzle

## 2021-05-30 NOTE — Telephone Encounter (Signed)
Successful telephone encounter to patient to follow up on his request to know if there are any lifestyle modification that will reduce his risk of AF. Discussed AF triggers including stress, failure to take medications as prescribed, additional caffeine intake, and volume overload. Patient appreciative of call and education. He is provided device clinic contact at [phone] for additional questions or concerns that may arise.

## 2021-06-05 ENCOUNTER — Other Ambulatory Visit: Payer: Self-pay | Admitting: Physician Assistant

## 2021-06-05 DIAGNOSIS — M1990 Unspecified osteoarthritis, unspecified site: Secondary | ICD-10-CM

## 2021-06-05 NOTE — Telephone Encounter (Signed)
Next Visit: 09/18/2021   Last Visit: 04/19/2021  Labs: 03/24/2021 RBC 4.00, Hgb 12.0, Hct 36.1,Glucose 101, Total Protein 6.1, Total Bilirubin 1.6  Eye exam: 03/14/2021 normal   Current Dose per office note 04/19/2021: PLQ 200 mg 1 tablet po qd  [id number] arthritis involving multiple sites with positive rheumatoid factor   Last Fill: 02/02/2021  Okay to refill Plaquenil?

## 2021-06-19 ENCOUNTER — Other Ambulatory Visit: Payer: Self-pay | Admitting: Rheumatology

## 2021-06-20 NOTE — Telephone Encounter (Signed)
Next Visit: 09/18/2021   Last Visit: 04/19/2021   UDS:04/19/2021 UDS consistent with tramadol use.   Narc Agreement: 04/19/2021   Last Fill: 05/23/2021   Okay to refill Tramadol?

## 2021-06-22 DIAGNOSIS — M7062 Trochanteric bursitis, left hip: Secondary | ICD-10-CM | POA: Diagnosis not present

## 2021-06-22 DIAGNOSIS — M1712 Unilateral primary osteoarthritis, left knee: Secondary | ICD-10-CM | POA: Diagnosis not present

## 2021-06-22 DIAGNOSIS — M25552 Pain in left hip: Secondary | ICD-10-CM | POA: Diagnosis not present

## 2021-06-22 DIAGNOSIS — M25562 Pain in left knee: Secondary | ICD-10-CM | POA: Diagnosis not present

## 2021-06-23 DIAGNOSIS — M7062 Trochanteric bursitis, left hip: Secondary | ICD-10-CM | POA: Insufficient documentation

## 2021-06-27 ENCOUNTER — Telehealth: Payer: Self-pay | Admitting: *Deleted

## 2021-06-27 NOTE — Telephone Encounter (Signed)
° °  Patient Name: [name]  DOB: October 24, 1945 MRN: [id number]  Primary Cardiologist: [name], MD  Chart reviewed as part of pre-operative protocol coverage. Patient was last seen by Dr. Burt Knack 04/26/21 with history as outlined. Tentatively Dr. Burt Knack anticipated he would be cleared for knee surgery without further testing. Dr. Burt Knack recommended follow-up in 6 months.  The patient has an upcoming appointment scheduled 10/22/21 with Dr. Burt Knack which is well in advance of the scheduled knee surgery 11/12/21. Would recommend to keep this appointment at which time this clearance can be addressed in case there are any issues arise at that visit that would impact surgical recommendations.   I added preop FYI to appointment notes so that provider is aware to address at time of OV. We are still several months away from surgery so would recommend Dr. Burt Knack touch base with pharm team at time of visit so most up to date recommendation on holding Eliquis can be assessed.   I will route this message as FYI to requesting party and remove this message from the pre-op box as separate preop APP input not needed at this time.  Please call with questions.  Charlie Pitter, PA-C 06/27/2021, 3:01 PM

## 2021-06-27 NOTE — Telephone Encounter (Signed)
° °  Pre-operative Risk Assessment    Patient Name: [name]  DOB: [birth year] MRN: [id number]      Request for Surgical Clearance    Procedure:   LEFT TOTAL KNEE ARTHROPLASTY  Date of Surgery:  Clearance 11/15/21                                 Surgeon:  DR. Gaynelle Arabian Surgeon's Group or Practice Name:  [facility] Phone number:  [phone] Fax number:  [phone] ATTN: [place] [facility]   Type of Clearance Requested:   - Medical  - Pharmacy:  Hold Apixaban (Eliquis)     Type of Anesthesia:   CHOICE   Additional requests/questions:    Jiles Prows   06/27/2021, 1:55 PM

## 2021-07-02 ENCOUNTER — Ambulatory Visit (INDEPENDENT_AMBULATORY_CARE_PROVIDER_SITE_OTHER): Payer: Medicare Other

## 2021-07-02 DIAGNOSIS — Z95810 Presence of automatic (implantable) cardiac defibrillator: Secondary | ICD-10-CM

## 2021-07-02 DIAGNOSIS — I5022 Chronic systolic (congestive) heart failure: Secondary | ICD-10-CM

## 2021-07-04 NOTE — Progress Notes (Signed)
EPIC Encounter for ICM Monitoring  Patient Name: [name] is a 76 y.o. male Date: 07/04/2021 Primary Care Physican: Biagio Borg, MD Primary Cardiologist: Burt Knack Electrophysiologist: Curt Bears Bi-V Pacing:   97.4%  Effective 92.8%      07/04/2021 Weight: 197 lbs   Time in AT/AF  0.0 hr/day (0.0%)                                                            Spoke with patient and heart failure questions reviewed.  Pt asymptomatic for fluid accumulation.     Optivol thoracic impedance suggesting normal fluid levels.      Takes Spirolactone 25 mg take 0.5 tablet daily.   Recommendations:  No changes and encouraged to call if experiencing any fluid symptoms.   Follow-up plan: ICM clinic phone appointment on 08/06/2021.   91 day device clinic remote transmission 11/01/2021.       EP/Cardiology Office Visits:  07/12/2021 with Tommye Standard, PA.  10/22/2021 with Dr Burt Knack.     Copy of ICM check sent to Dr. Curt Bears.   3 month ICM trend: 07/02/2021.    12-14 Month ICM trend:     [name], RN 07/04/2021 3:22 PM

## 2021-07-11 NOTE — Progress Notes (Addendum)
Cardiology Office Note Date:  07/12/2021  Patient ID:  [name], [name] [birth year], MRN [id number] PCP:  Biagio Borg, MD  Cardiologist:  Dr. Burt Knack Electrophysiologist: Dr. Curt Bears    Chief Complaint:  1 year  History of Present Illness: [name] is a 76 y.o. male with history of CAD (remote PCI to the RCA), NICM, chronic CHF (systolic), LBBB, Afib, HTN, HLD, OSA (w/CPAP)   He has had subsequent recovery of his LVEF  He come sin today to be seen for Dr. Curt Bears, last seen by him Feb 2022, doing well, no changes were made.  More recently trouble with GIB, has had a number of endoscopic procedures,  had multiple polyps and underwent EUS/ERCP with ampullectomy for treatment of ampullary adenoma.  Stents were placed as well, bleeding perhaps 2/2 the procedure  He saw Dr. Burt Knack Dec 2022, was doing better, no recurrent melena or GIB as of his visit, back on his [place], DOE with stairs attributed to weight gain by the pt, otherwise no c/o Discussed perhaps watchman, though patient wanted to hold off, would give some consideration if he had more bleeding.  Device clinic has noted an up-tick in AF burden  Pending a knee surgery in June, has an appt with Dr. Burt Knack ahead of this already scheduled  TODAY He is doing well Will get breathless with stairs, otherwise denies exertional intolerances. No CP, near syncope or syncope. He was aware of the longer episode of Afib with palpitations, otherwise no real sense of any significant AF burden  No bleeding or signs of bleeding   Device information MDT CRT-D implanted 01/0/9323 complicated by microperforation tx w/colchicine   Past Medical History:  Diagnosis Date   AICD (automatic cardioverter/defibrillator) present    Allergic rhinitis    Allergy    Anginal pain ([place]) 05/26/14   chest pain after chasing dog   Atypical nevus of back 04/27/2003   moderate - mid lower back   Basal cell carcinoma 01/26/2008   right cheek -  MOHs   CAD (coronary artery disease)    hx of stent- 2005 RCA   Cataract    beginning stage both eyes   CHF (congestive heart failure) (HCC)    pacemaker Medtronic     Chronic back pain    intermittent   Constipation    Dizziness    Dysrhythmia    right bundle branch block    Esophageal stricture    GERD (gastroesophageal reflux disease)    Hemoptysis    Hiatal hernia    History of colonic polyps    hyperplastic   HTN (hypertension)    Hyperlipidemia    Hypertrophy of prostate with urinary obstruction and other lower urinary tract symptoms (LUTS)    OA (osteoarthritis)    OSA (obstructive sleep apnea)    cpap- 10    Other specified disorder of stomach and duodenum    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 Ardis Hughs 5/10   Pericarditis 07/06/2019   Pneumonia    Pre-diabetes    no medications   Shortness of breath    with exertion   Sleep apnea    cpap   Testicular hypofunction     Past Surgical History:  Procedure Laterality Date   BILIARY STENT PLACEMENT N/A 03/19/2021   Procedure: BILIARY STENT PLACEMENT;  Surgeon: Irving Copas., MD;  Location: [facility];  Service: Gastroenterology;  Laterality: N/A;   BIV ICD INSERTION CRT-D N/A 03/27/2017   Procedure: BIV  ICD INSERTION CRT-D;  Surgeon: Constance Haw, MD;  Location: [facility];  Service: Cardiovascular;  Laterality: N/A;   CARDIAC CATHETERIZATION     '05, last 2009, showing patent RCA stent   COLONOSCOPY  12/2007   HYPERPLASTIC POLYP   COLONOSCOPY  12/2019   COLONOSCOPY WITH PROPOFOL N/A 06/02/2014   Procedure: COLONOSCOPY WITH PROPOFOL;  Surgeon: [name], MD;  Location: [facility];  Service: Endoscopy;  Laterality: N/A;   CORONARY ANGIOPLASTY  08/2003   ENDOSCOPIC MUCOSAL RESECTION  03/19/2021   Procedure: ENDOSCOPIC MUCOSAL RESECTION, ampullectomy;  Surgeon: Irving Copas., MD;  Location: [facility];  Service: Gastroenterology;;   ENDOSCOPIC  RETROGRADE CHOLANGIOPANCREATOGRAPHY (ERCP) WITH PROPOFOL N/A 03/19/2021   Procedure: ENDOSCOPIC RETROGRADE CHOLANGIOPANCREATOGRAPHY (ERCP) WITH PROPOFOL;  Surgeon: Irving Copas., MD;  Location: [facility];  Service: Gastroenterology;  Laterality: N/A;   ESOPHAGOGASTRODUODENOSCOPY (EGD) WITH PROPOFOL N/A 06/02/2014   Procedure: ESOPHAGOGASTRODUODENOSCOPY (EGD) WITH PROPOFOL;  Surgeon: [name], MD;  Location: [facility];  Service: Endoscopy;  Laterality: N/A;   ESOPHAGOGASTRODUODENOSCOPY (EGD) WITH PROPOFOL N/A 03/19/2021   Procedure: ESOPHAGOGASTRODUODENOSCOPY (EGD) WITH PROPOFOL;  Surgeon: Rush Landmark Telford Nab., MD;  Location: [facility];  Service: Gastroenterology;  Laterality: N/A;   ESOPHAGOGASTRODUODENOSCOPY (EGD) WITH PROPOFOL N/A 03/23/2021   Procedure: ESOPHAGOGASTRODUODENOSCOPY (EGD) WITH PROPOFOL;  Surgeon: Ladene Artist, MD;  Location: [facility];  Service: Endoscopy;  Laterality: N/A;   hernia surgery x 3     Bilateral Inguinal, Umbicial   INSERTION OF MESH N/A 03/20/2018   Procedure: INSERTION OF MESH;  Surgeon: Alphonsa Overall, MD;  Location: [facility];  Service: General;  Laterality: N/A;   IRRIGATION AND DEBRIDEMENT ABSCESS Left 08/14/2012   Procedure: IRRIGATION AND DEBRIDEMENT LEFT INGUINAL BOIL ;  Surgeon: Ailene Rud, MD;  Location: [facility];  Service: Urology;  Laterality: Left;   JOINT REPLACEMENT Right 2017   KNEE ARTHROPLASTY     LEFT HEART CATH AND CORONARY ANGIOGRAPHY N/A 01/24/2019   Procedure: LEFT HEART CATH AND CORONARY ANGIOGRAPHY;  Surgeon: Martinique, Peter M, MD;  Location: [facility];  Service: Cardiovascular;  Laterality: N/A;   LEFT HEART CATHETERIZATION WITH CORONARY ANGIOGRAM N/A 05/26/2014   Procedure: LEFT HEART CATHETERIZATION WITH CORONARY ANGIOGRAM;  Surgeon: Blane Ohara, MD;  Location: [facility];  Service: Cardiovascular;  Laterality: N/A;   LUMBAR LAMINECTOMY/DECOMPRESSION MICRODISCECTOMY Left 09/26/2014   Procedure:  Lumbar Laminectomy for resection of synovial cyst  Lumbar five- sacral one left;  Surgeon: Kary Kos, MD;  Location: [facility];  Service: Neurosurgery;  Laterality: Left;   MOLE REMOVAL Left 03/20/2018   Procedure: MOLE REMOVAL;  Surgeon: Alphonsa Overall, MD;  Location: [place];  Service: General;  Laterality: Left;   Oral surg to removed growth from ?sinus  10/2010   ?Dermoid removed by [facility]   PACEMAKER INSERTION  04/03/2017   PANCREATIC STENT PLACEMENT  03/19/2021   Procedure: PANCREATIC STENT PLACEMENT;  Surgeon: Irving Copas., MD;  Location: [facility];  Service: Gastroenterology;;   POLYPECTOMY     RCA stenting     '05 RCA   right toe surgery  Right    Cyst    RIGHT/LEFT HEART CATH AND CORONARY ANGIOGRAPHY N/A 01/15/2017   Procedure: RIGHT/LEFT HEART CATH AND CORONARY ANGIOGRAPHY;  Surgeon: [name], MD;  Location: [facility];  Service: Cardiovascular;  Laterality: N/A;   s/p right knee arthroscopy  2005   SPHINCTEROTOMY  03/19/2021   Procedure: SPHINCTEROTOMY;  Surgeon: Justice Britain  Brooke Bonito., MD;  Location: [facility];  Service: Gastroenterology;;   Lia Foyer LIFTING INJECTION  03/19/2021   Procedure: SUBMUCOSAL LIFTING INJECTION;  Surgeon: Irving Copas., MD;  Location: [facility];  Service: Gastroenterology;;   thumb surgery Right    TOTAL KNEE ARTHROPLASTY Right 03/12/2016   Procedure: RIGHT TOTAL KNEE ARTHROPLASTY;  Surgeon: Paralee Cancel, MD;  Location: [facility];  Service: Orthopedics;  Laterality: Right;   UPPER ESOPHAGEAL ENDOSCOPIC ULTRASOUND (EUS) N/A 03/19/2021   Procedure: UPPER ESOPHAGEAL ENDOSCOPIC ULTRASOUND (EUS);  Surgeon: Irving Copas., MD;  Location: [facility];  Service: Gastroenterology;  Laterality: N/A;   UPPER GASTROINTESTINAL ENDOSCOPY     VENTRAL HERNIA REPAIR N/A 03/20/2018   Procedure: LAPAROSCOPIC VENTRAL INCISIONAL  HERNIA ERAS PATHWAY;  Surgeon: Alphonsa Overall, MD;  Location: [facility];  Service: General;   Laterality: N/A;    Current Outpatient Medications  Medication Sig Dispense Refill   acetaminophen (TYLENOL) 650 MG CR tablet Take 650 mg by mouth in the morning and at bedtime.     atorvastatin (LIPITOR) 20 MG tablet TAKE 1 TABLET BY MOUTH DAILY 90 tablet 3   b complex vitamins tablet Take 1 tablet by mouth daily with lunch.     carvedilol (COREG) 25 MG tablet TAKE 1 TABLET BY MOUTH 2 TIMES DAILY. GENERIC EQUIVALENT FOR COREG. (Patient taking differently: Take 25 mg by mouth in the morning and at bedtime.) 180 tablet 1   cholecalciferol (VITAMIN D3) 25 MCG (1000 UNIT) tablet Take 1,000 Units by mouth daily.     Coenzyme Q10 (CO Q 10 PO) Take 200 mg by mouth daily with lunch.     diclofenac sodium (VOLTAREN) 1 % GEL Apply 1 application topically 4 (four) times daily as needed (pain).   3   ELIQUIS 5 MG TABS tablet TAKE 1 TABLET BY MOUTH TWICE DAILY 180 tablet 1   ENTRESTO 49-51 MG TAKE 1 TABLET BY MOUTH TWICE DAILY (Patient taking differently: Take 1 tablet by mouth 2 (two) times daily.) 180 tablet 3   Flaxseed, Linseed, (FLAXSEED OIL PO) Take 1 capsule by mouth daily. With Omega 3     folic acid (FOLVITE) 035 MCG tablet Take 400 mcg by mouth daily.     Glucosamine HCl (GLUCOSAMINE PO) Take 1,500 mg by mouth every evening.     HYDROcodone-acetaminophen (NORCO/VICODIN) 5-325 MG tablet Take 1 tablet by mouth daily.     hydrocortisone 2.5 % cream Apply 1 application topically 2 (two) times daily as needed (rash on nose).      hydroxychloroquine (PLAQUENIL) 200 MG tablet TAKE 1 TABLET(200 MG) BY MOUTH DAILY 90 tablet 0   Lutein 20 MG TABS Take 20 mg by mouth daily.     Multiple Vitamin (MULTIVITAMIN) capsule Take 1 capsule by mouth daily.       nabumetone (RELAFEN) 500 MG tablet Take 500 mg by mouth daily.     nitroGLYCERIN (NITROSTAT) 0.4 MG SL tablet Place 1 tablet (0.4 mg total) under the tongue every 5 (five) minutes x 3 doses as needed for chest pain. 25 tablet 0   NON FORMULARY CPAP machine  with sleep.     pantoprazole (PROTONIX) 40 MG tablet Take 1 tablet (40 mg total) by mouth 2 (two) times daily before a meal. TAKE 1 TABLET BY MOUTH EVERY DAY BEFORE BREAKFAST (Patient taking differently: Take 40 mg by mouth daily. TAKE 1 TABLET BY MOUTH EVERY DAY BEFORE BREAKFAST) 60 tablet 6   polyethylene glycol (MIRALAX / GLYCOLAX) 17 g packet Take 17  g by mouth daily as needed for mild constipation. 14 each 0   Probiotic Product (PROBIOTIC DAILY PO) Take 1 capsule by mouth daily with lunch.      RESTASIS 0.05 % ophthalmic emulsion Place 1 drop into both eyes 2 (two) times daily.     Saw Palmetto, Serenoa repens, (SAW PALMETTO PO) Take 450 mg by mouth every evening.     spironolactone (ALDACTONE) 25 MG tablet TAKE 1/2 TABLET(12.5 MG) BY MOUTH DAILY (Patient taking differently: Take 12.5 mg by mouth daily.) 45 tablet 2   tamsulosin (FLOMAX) 0.4 MG CAPS capsule Take 0.4 mg by mouth daily.     Testosterone 20 % CREA Apply 2 mLs topically daily. Rub on shoulder     traMADol (ULTRAM) 50 MG tablet TAKE 1 TABLET(50 MG) BY MOUTH AT BEDTIME AS NEEDED 30 tablet 0   TURMERIC PO Take 1 tablet by mouth daily with lunch.      vitamin C (ASCORBIC ACID) 500 MG tablet Take 500 mg by mouth daily.     zinc gluconate 50 MG tablet Take 50 mg by mouth daily.     Apixaban (ELIQUIS PO) Take 5 mg by mouth in the morning and at bedtime. (Patient not taking: Reported on 07/12/2021)     sucralfate (CARAFATE) 1 g tablet Take 1 tablet (1 g total) by mouth 4 (four) times daily. (Patient not taking: Reported on 07/12/2021) 120 tablet 0   No current facility-administered medications for this visit.    Allergies:   Sulfonamide derivatives   Social History:  The patient  reports that he quit smoking about 31 years ago. His smoking use included cigarettes. He has never used smokeless tobacco. He reports current alcohol use. He reports that he does not use drugs.   Family History:  The patient's family history includes Arthritis  in his brother and daughter; COPD in his brother; Cancer in his brother; Coronary artery disease in his father; Diabetes in his father; Heart disease in his father and mother; Heart failure in his brother; Hypertension in an other family member; Stomach cancer in his paternal grandmother; Sudden death in his father.  ROS:  Please see the history of present illness.    All other systems are reviewed and otherwise negative.   PHYSICAL EXAM:  VS:  BP 128/74    Pulse 65    Ht 5\' 7"  (1.702 m)    Wt 183 lb 3.2 oz (83.1 kg)    SpO2 95%    BMI 28.69 kg/m  BMI: Body mass index is 28.69 kg/m. Well nourished, well developed, in no acute distress HEENT: normocephalic, atraumatic Neck: no JVD, carotid bruits or masses Cardiac:  RRR; no significant murmurs, no rubs, or gallops Lungs:  CTA b/l, no wheezing, rhonchi or rales Abd: soft, nontender MS: no deformity or  atrophy Ext:  no edema Skin: warm and dry, no rash Neuro:  No gross deficits appreciated Psych: euthymic mood, full affect   ICD site is stable, no tethering or discomfort   EKG:  not done today  Device interrogation done today and reviewed by myself:  Battery and lead measurements look ok LV lead is programmed AT his threshold and output increased to 3.0/1.0 (a 1/4V margin) One long AF episode Jan3, burden is <1% He has had 5 NSVT (since Feb 2022)  06/22/20: TTE IMPRESSIONS   1. Left ventricular ejection fraction, by estimation, is 50 to 55%. The  left ventricle has low normal function. The left ventricle has no regional  wall motion abnormalities. There is moderate concentric left ventricular  hypertrophy. Left ventricular  diastolic parameters are indeterminate.   2. Right ventricular systolic function is normal. The right ventricular  size is normal. There is normal pulmonary artery systolic pressure.   3. Left atrial size was mildly dilated.   4. The mitral valve is normal in structure. Mild mitral valve  regurgitation. No  evidence of mitral stenosis.   5. The aortic valve is tricuspid. There is mild calcification of the  aortic valve. Aortic valve regurgitation is not visualized. No aortic  stenosis is present.   Comparison(s): No significant change from prior study.    TTE 06/25/16 Review of the above records today demonstrates:  - Left ventricle: The cavity size was normal. Systolic function was   normal. The estimated ejection fraction was in the range of 55%   to 60%. Wall motion was normal; there were no regional wall   motion abnormalities. Doppler parameters are consistent with   abnormal left ventricular relaxation (grade 1 diastolic   dysfunction). - Ventricular septum: Septal motion showed dyssynergy. These   changes are consistent with a left bundle branch block. - Aortic valve: There was trivial regurgitation. - Atrial septum: There was increased thickness of the septum,   consistent with lipomatous hypertrophy.   LHC/RHC 01/05/17 1. Widely patent coronary arteries with continued patency of the stented segment in the right coronary artery 2. Mild nonobstructive coronary artery disease as detailed with mild stenosis of the proximal to mid LAD and otherwise minimal luminal irregularities 3. Well compensated right-sided cardiac hemodynamics   12/03/2016 TTE Study Conclusions  - Left ventricle: The cavity size was mildly dilated. There was    mild concentric hypertrophy. Systolic function was moderately to    severely reduced. The estimated ejection fraction was in the    range of 30% to 35%. Wall motion was normal; there were no    regional wall motion abnormalities. Features are consistent with    a pseudonormal left ventricular filling pattern, with concomitant    abnormal relaxation and increased filling pressure (grade 2    diastolic dysfunction). Doppler parameters are consistent with    elevated ventricular end-diastolic filling pressure.  - Aortic valve: There was trivial  regurgitation.  - Mitral valve: There was moderate to severe regurgitation. Valve    area by pressure half-time: 1.59 cm^2.  - Left atrium: The atrium was moderately dilated.  - Right ventricle: Systolic function was normal.  - Pulmonic valve: There was mild regurgitation.  - Pulmonary arteries: Systolic pressure was mildly increased. PA    peak pressure: 36 mm Hg (S).  - Inferior vena cava: The vessel was normal in size. The    respirophasic diameter changes were in the normal range (= 50%),    consistent with normal central venous pressure.   Impressions:  - When compared to the prior study from 03/08/2015 LVEF has    decreased to 30-35% with diffuse hypokinesis and significant    septal-lateral wall dyssynchrony.   Recent Labs: 03/24/2021: ALT 20; BUN 21; Creatinine, Ser 0.89; Hemoglobin 12.0; Magnesium 2.4; Platelets 236; Potassium 3.9; Sodium 138  No results found for requested labs within last 8760 hours.   CrCl cannot be calculated (Patient's most recent lab result is older than the maximum 21 days allowed.).   Wt Readings from Last 3 Encounters:  07/12/21 183 lb 3.2 oz (83.1 kg)  04/26/21 192 lb (87.1 kg)  04/19/21 194 lb (88 kg)  Other studies reviewed: Additional studies/records reviewed today include: summarized above  ASSESSMENT AND PLAN:  ICD As above   Paroxysmal Afib CHA2DS2Vasc is 5, on Eliquis, appropriately dosed <1 % burden Discussed if increasing burden would look towards a more targeted AF therapy, likely AAD if needed  NICM Chronic CHF (systolic) Has had recovery of his LVEF No symptoms or exam findings of volume OL, OptiVol looks great  CAD No symptoms  Sees Dr. Burt Knack in June, likely to get labs then (Nov labs look ok)  Disposition: F/u with remotes as usual, EP in a year, sooner if needed  Current medicines are reviewed at length with the patient today.  The patient did not have any concerns regarding medicines.  Venetia Night,  PA-C 07/12/2021 11:11 AM     [facility] [address] [place]  [address] [phone] (office)  [phone] (fax)

## 2021-07-12 ENCOUNTER — Other Ambulatory Visit: Payer: Self-pay

## 2021-07-12 ENCOUNTER — Ambulatory Visit (INDEPENDENT_AMBULATORY_CARE_PROVIDER_SITE_OTHER): Payer: Medicare Other | Admitting: Physician Assistant

## 2021-07-12 ENCOUNTER — Encounter: Payer: Self-pay | Admitting: Physician Assistant

## 2021-07-12 VITALS — BP 128/74 | HR 65 | Ht 67.0 in | Wt 183.2 lb

## 2021-07-12 DIAGNOSIS — Z95810 Presence of automatic (implantable) cardiac defibrillator: Secondary | ICD-10-CM | POA: Diagnosis not present

## 2021-07-12 DIAGNOSIS — I428 Other cardiomyopathies: Secondary | ICD-10-CM | POA: Diagnosis not present

## 2021-07-12 DIAGNOSIS — I2510 Atherosclerotic heart disease of native coronary artery without angina pectoris: Secondary | ICD-10-CM | POA: Diagnosis not present

## 2021-07-12 DIAGNOSIS — I5022 Chronic systolic (congestive) heart failure: Secondary | ICD-10-CM | POA: Diagnosis not present

## 2021-07-12 DIAGNOSIS — I480 Paroxysmal atrial fibrillation: Secondary | ICD-10-CM | POA: Diagnosis not present

## 2021-07-12 LAB — CUP PACEART INCLINIC DEVICE CHECK
Battery Remaining Longevity: 26 mo
Battery Voltage: 2.94 V
Brady Statistic AP VP Percent: 0.48 %
Brady Statistic AP VS Percent: 0.02 %
Brady Statistic AS VP Percent: 97 %
Brady Statistic AS VS Percent: 2.5 %
Brady Statistic RA Percent Paced: 0.5 %
Brady Statistic RV Percent Paced: 37.59 %
Date Time Interrogation Session: 20230223114525
HighPow Impedance: 75 Ohm
Implantable Lead Implant Date: 20181108
Implantable Lead Implant Date: 20181108
Implantable Lead Implant Date: 20181108
Implantable Lead Location: 753858
Implantable Lead Location: 753859
Implantable Lead Location: 753860
Implantable Lead Model: 4298
Implantable Lead Model: 5076
Implantable Pulse Generator Implant Date: 20181108
Lead Channel Impedance Value: 1197 Ohm
Lead Channel Impedance Value: 1235 Ohm
Lead Channel Impedance Value: 1292 Ohm
Lead Channel Impedance Value: 228 Ohm
Lead Channel Impedance Value: 245.538
Lead Channel Impedance Value: 299.657
Lead Channel Impedance Value: 299.657
Lead Channel Impedance Value: 330.703
Lead Channel Impedance Value: 418 Ohm
Lead Channel Impedance Value: 456 Ohm
Lead Channel Impedance Value: 456 Ohm
Lead Channel Impedance Value: 532 Ohm
Lead Channel Impedance Value: 608 Ohm
Lead Channel Impedance Value: 703 Ohm
Lead Channel Impedance Value: 760 Ohm
Lead Channel Impedance Value: 779 Ohm
Lead Channel Impedance Value: 836 Ohm
Lead Channel Impedance Value: 874 Ohm
Lead Channel Pacing Threshold Amplitude: 0.375 V
Lead Channel Pacing Threshold Amplitude: 0.625 V
Lead Channel Pacing Threshold Amplitude: 2.75 V
Lead Channel Pacing Threshold Pulse Width: 0.4 ms
Lead Channel Pacing Threshold Pulse Width: 0.4 ms
Lead Channel Pacing Threshold Pulse Width: 1 ms
Lead Channel Sensing Intrinsic Amplitude: 14.625 mV
Lead Channel Sensing Intrinsic Amplitude: 15.125 mV
Lead Channel Sensing Intrinsic Amplitude: 2.875 mV
Lead Channel Sensing Intrinsic Amplitude: 3 mV
Lead Channel Setting Pacing Amplitude: 2 V
Lead Channel Setting Pacing Amplitude: 2.5 V
Lead Channel Setting Pacing Amplitude: 3 V
Lead Channel Setting Pacing Pulse Width: 0.4 ms
Lead Channel Setting Pacing Pulse Width: 1 ms
Lead Channel Setting Sensing Sensitivity: 0.3 mV

## 2021-07-12 MED ORDER — APIXABAN 5 MG PO TABS: 5.0000 mg | ORAL_TABLET | Freq: Two times a day (BID) | ORAL | 3 refills | Status: DC

## 2021-07-12 MED ORDER — ENTRESTO 49-51 MG PO TABS: 1.0000 | ORAL_TABLET | Freq: Two times a day (BID) | ORAL | 3 refills | Status: DC

## 2021-07-12 NOTE — Patient Instructions (Signed)
Medication Instructions:   Your physician recommends that you continue on your current medications as directed. Please refer to the Current Medication list given to you today.  *If you need a refill on your cardiac medications before your next appointment, please call your pharmacy*   Lab Work:  NONE ORDERED  TODAY   If you have labs (blood work) drawn today and your tests are completely normal, you will receive your results only by: . MyChart Message (if you have MyChart) OR . A paper copy in the mail If you have any lab test that is abnormal or we need to change your treatment, we will call you to review the results.   Testing/Procedures: NONE ORDERED  TODAY    Follow-Up: At [facility], you and your health needs are our priority.  As part of our continuing mission to provide you with exceptional heart care, we have created designated Provider Care Teams.  These Care Teams include your primary Cardiologist (physician) and Advanced Practice Providers (APPs -  Physician Assistants and Nurse Practitioners) who all work together to provide you with the care you need, when you need it.  We recommend signing up for the patient portal called "MyChart".  Sign up information is provided on this After Visit Summary.  MyChart is used to connect with patients for Virtual Visits (Telemedicine).  Patients are able to view lab/test results, encounter notes, upcoming appointments, etc.  Non-urgent messages can be sent to your provider as well.   To learn more about what you can do with MyChart, go to [email or web address].    Your next appointment:   1 year(s)  The format for your next appointment:   In Person  Provider:   Will Camnitz, MD    Other Instructions   

## 2021-07-24 ENCOUNTER — Other Ambulatory Visit: Payer: Self-pay | Admitting: *Deleted

## 2021-07-24 MED ORDER — TRAMADOL HCL 50 MG PO TABS: ORAL_TABLET | ORAL | 0 refills | Status: DC

## 2021-07-24 NOTE — Telephone Encounter (Signed)
Patient states he is not taking hydrocodone. Patient states he took it 5 years ago when he had a knee replacement.  ?

## 2021-07-24 NOTE — Telephone Encounter (Signed)
Refill request received via fax ? ?Next Visit: 09/18/2021 ?  ?Last Visit: 04/19/2021 ?  ?UDS:04/19/2021 UDS consistent with tramadol use. ?  ?Narc Agreement: 04/19/2021 ?  ?Last Fill: 06/20/2021 ?  ?Okay to refill Tramadol?  ?

## 2021-07-24 NOTE — Telephone Encounter (Signed)
Does the patient need the prescription printed/faxed or can I send it electronically?

## 2021-07-30 ENCOUNTER — Other Ambulatory Visit: Payer: Self-pay

## 2021-07-30 ENCOUNTER — Telehealth: Payer: Self-pay | Admitting: Gastroenterology

## 2021-07-30 DIAGNOSIS — D135 Benign neoplasm of extrahepatic bile ducts: Secondary | ICD-10-CM

## 2021-07-30 DIAGNOSIS — T85528A Displacement of other gastrointestinal prosthetic devices, implants and grafts, initial encounter: Secondary | ICD-10-CM

## 2021-07-30 DIAGNOSIS — K317 Polyp of stomach and duodenum: Secondary | ICD-10-CM

## 2021-07-30 NOTE — Telephone Encounter (Signed)
Patient called states he had a stent placed in October and is wondering when it will be removed. Requested a call back. ?

## 2021-07-30 NOTE — Telephone Encounter (Signed)
Returned call to patient. He has been scheduled for OV with Dr. Rush Landmark on 08/28/21 at 9:50am. He has been scheduled for ERCP on 09/24/21 at [facility].Instructions will be given to patient at his ov follow-up appointment.  ?

## 2021-08-01 ENCOUNTER — Ambulatory Visit: Payer: Medicare Other

## 2021-08-01 NOTE — Progress Notes (Signed)
Opened in error

## 2021-08-02 ENCOUNTER — Telehealth: Payer: Self-pay

## 2021-08-02 ENCOUNTER — Ambulatory Visit (INDEPENDENT_AMBULATORY_CARE_PROVIDER_SITE_OTHER): Payer: Medicare Other

## 2021-08-02 DIAGNOSIS — I5022 Chronic systolic (congestive) heart failure: Secondary | ICD-10-CM | POA: Diagnosis not present

## 2021-08-02 LAB — CUP PACEART REMOTE DEVICE CHECK
Battery Remaining Longevity: 25 mo
Battery Voltage: 2.94 V
Brady Statistic AP VP Percent: 0.52 %
Brady Statistic AP VS Percent: 0.02 %
Brady Statistic AS VP Percent: 96.91 %
Brady Statistic AS VS Percent: 2.56 %
Brady Statistic RA Percent Paced: 0.53 %
Brady Statistic RV Percent Paced: 23.54 %
Date Time Interrogation Session: 20230315022824
HighPow Impedance: 78 Ohm
Implantable Lead Implant Date: 20181108
Implantable Lead Implant Date: 20181108
Implantable Lead Implant Date: 20181108
Implantable Lead Location: 753858
Implantable Lead Location: 753859
Implantable Lead Location: 753860
Implantable Lead Model: 4298
Implantable Lead Model: 5076
Implantable Pulse Generator Implant Date: 20181108
Lead Channel Impedance Value: 1178 Ohm
Lead Channel Impedance Value: 1197 Ohm
Lead Channel Impedance Value: 1254 Ohm
Lead Channel Impedance Value: 212.8 Ohm
Lead Channel Impedance Value: 224.438
Lead Channel Impedance Value: 270.092
Lead Channel Impedance Value: 295.059
Lead Channel Impedance Value: 317.915
Lead Channel Impedance Value: 399 Ohm
Lead Channel Impedance Value: 418 Ohm
Lead Channel Impedance Value: 456 Ohm
Lead Channel Impedance Value: 513 Ohm
Lead Channel Impedance Value: 589 Ohm
Lead Channel Impedance Value: 608 Ohm
Lead Channel Impedance Value: 665 Ohm
Lead Channel Impedance Value: 703 Ohm
Lead Channel Impedance Value: 779 Ohm
Lead Channel Impedance Value: 836 Ohm
Lead Channel Pacing Threshold Amplitude: 0.375 V
Lead Channel Pacing Threshold Amplitude: 0.5 V
Lead Channel Pacing Threshold Amplitude: 3 V
Lead Channel Pacing Threshold Pulse Width: 0.4 ms
Lead Channel Pacing Threshold Pulse Width: 0.4 ms
Lead Channel Pacing Threshold Pulse Width: 1 ms
Lead Channel Sensing Intrinsic Amplitude: 13.125 mV
Lead Channel Sensing Intrinsic Amplitude: 13.125 mV
Lead Channel Sensing Intrinsic Amplitude: 2.875 mV
Lead Channel Sensing Intrinsic Amplitude: 2.875 mV
Lead Channel Setting Pacing Amplitude: 2 V
Lead Channel Setting Pacing Amplitude: 2.5 V
Lead Channel Setting Pacing Amplitude: 3 V
Lead Channel Setting Pacing Pulse Width: 0.4 ms
Lead Channel Setting Pacing Pulse Width: 1 ms
Lead Channel Setting Sensing Sensitivity: 0.3 mV

## 2021-08-02 NOTE — Telephone Encounter (Signed)
Pharmacy, can you please comment on how long Eliquis can be held prior to procedure? ? ?Thank you! ?

## 2021-08-02 NOTE — Telephone Encounter (Signed)
Request for surgical clearance:     Endoscopy Procedure ? ?What type of surgery is being performed? ERCP ? ?When is this surgery scheduled?     09/24/2021 ? ?What type of clearance is required ?   Pharmacy ? ?Are there any medications that need to be held prior to surgery and how long? Eliquis x2 days prior to procedure  ? ?Practice name and name of physician performing surgery?      [facility] ? ?What is your office phone and fax number?      Phone- [phone]  Fax- [phone] ? ?Anesthesia type (None, local, MAC, general) ?       [facility] ? ?

## 2021-08-03 ENCOUNTER — Telehealth: Payer: Self-pay

## 2021-08-03 NOTE — Telephone Encounter (Signed)
-----   Message from Will [name], MD sent at 08/03/2021 11:28 AM EDT ----- ?Abnormal device interrogation reviewed.  Lead parameters and battery status stable.  Device clinic follow-up to evaluate left atrial lead ?

## 2021-08-03 NOTE — Telephone Encounter (Signed)
Patient called and agreeable for in-clinic in person check.  ? ?Apt made 08/08/21 @ 8:40. Location, date and time discussed with patient with verbal understanding. ?

## 2021-08-06 ENCOUNTER — Ambulatory Visit: Payer: Medicare Other

## 2021-08-06 DIAGNOSIS — Z95810 Presence of automatic (implantable) cardiac defibrillator: Secondary | ICD-10-CM

## 2021-08-06 DIAGNOSIS — I5022 Chronic systolic (congestive) heart failure: Secondary | ICD-10-CM

## 2021-08-06 NOTE — Telephone Encounter (Signed)
? ?  Name: [name]  ?DOB: 01-Oct-1945  ?MRN: [id number]  ? ?Primary Cardiologist: [name], MD ? ?Chart reviewed as part of pre-operative protocol coverage. We are asked for guidance to hold eliquis.  ? ?Per our clinical pharmacist: ?Patient with diagnosis of A Fib on Eliquis for anticoagulation.   ?  ?Procedure: ERCP ?Date of procedure: 09/24/21 ?  ?  ?CHA2DS2-VASc Score = 5  ?This indicates a 7.2% annual risk of stroke. ?The patient's score is based upon: ?CHF History: 1 ?HTN History: 1 ?Diabetes History: 0 ?Stroke History: 0 ?Vascular Disease History: 1 ?Age Score: 2 ?Gender Score: 0 ?  ?  ?CrCl 82 mL/min ?Platelet count 236K ?  ?  ?Per office protocol, patient can hold  Eliquis 2 days  days prior to procedure ? ? ?I will route this recommendation to the requesting party via Epic fax function and remove from pre-op pool. Please call with questions. ? ?Ledora Bottcher, PA ?08/06/2021, 9:56 AM ? ?

## 2021-08-06 NOTE — Telephone Encounter (Signed)
Patient with diagnosis of A Fib on Eliquis for anticoagulation.   ? ?Procedure: ERCP ?Date of procedure: 09/24/21 ? ? ?CHA2DS2-VASc Score = 5  ?This indicates a 7.2% annual risk of stroke. ?The patient's score is based upon: ?CHF History: 1 ?HTN History: 1 ?Diabetes History: 0 ?Stroke History: 0 ?Vascular Disease History: 1 ?Age Score: 2 ?Gender Score: 0 ?  ? ?CrCl 82 mL/min ?Platelet count 236K ? ? ?Per office protocol, patient can hold  Eliquis 2 days  days prior to procedure.   ?

## 2021-08-07 ENCOUNTER — Ambulatory Visit (INDEPENDENT_AMBULATORY_CARE_PROVIDER_SITE_OTHER): Payer: Medicare Other

## 2021-08-07 ENCOUNTER — Telehealth: Payer: Self-pay

## 2021-08-07 DIAGNOSIS — I5022 Chronic systolic (congestive) heart failure: Secondary | ICD-10-CM

## 2021-08-07 DIAGNOSIS — Z95810 Presence of automatic (implantable) cardiac defibrillator: Secondary | ICD-10-CM | POA: Diagnosis not present

## 2021-08-07 NOTE — Telephone Encounter (Signed)
Remote ICM transmission received.  Attempted call to patient regarding ICM remote transmission and left detailed message per DPR.  Advised to return call for any fluid symptoms or questions. Next ICM remote transmission scheduled 09/10/2021.   ? ?

## 2021-08-07 NOTE — Progress Notes (Signed)
EPIC Encounter for ICM Monitoring ? ?Patient Name: [name] is a 77 y.o. male ?Date: 08/07/2021 ?Primary Care Physican: Biagio Borg, MD ?Primary Cardiologist: Burt Knack ?Electrophysiologist: Camnitz ?Bi-V Pacing:   97.3%  Effective 89.5%      ?07/04/2021 Weight: 197 lbs  ?  ?Time in AT/AF  0.0 hr/day (0.0%) ?  ?                                                         ?Attempted call to patient and unable to reach.  Left detailed message per DPR regarding transmission. Transmission reviewed.   ?  ?Optivol thoracic impedance suggesting normal fluid levels.    ?  ?Takes Spirolactone 25 mg take 0.5 tablet daily. ?  ?Recommendations:  Left voice mail with ICM number and encouraged to call if experiencing any fluid symptoms. ?  ?Follow-up plan: ICM clinic phone appointment on 09/10/2021.   91 day device clinic remote transmission 11/01/2021.     ?  ?EP/Cardiology Office Visits:    10/22/2021 with Dr Burt Knack.   ?  ?Copy of ICM check sent to Dr. Curt Bears.  ? ?3 month ICM trend: 08/07/2021. ? ? ? ?12-14 Month ICM trend:  ? ? ? ?[name], RN ?08/07/2021 ?10:39 AM ? ?

## 2021-08-08 ENCOUNTER — Other Ambulatory Visit: Payer: Self-pay

## 2021-08-08 ENCOUNTER — Ambulatory Visit (INDEPENDENT_AMBULATORY_CARE_PROVIDER_SITE_OTHER): Payer: Medicare Other

## 2021-08-08 DIAGNOSIS — I5022 Chronic systolic (congestive) heart failure: Secondary | ICD-10-CM

## 2021-08-08 DIAGNOSIS — I420 Dilated cardiomyopathy: Secondary | ICD-10-CM

## 2021-08-08 LAB — CUP PACEART INCLINIC DEVICE CHECK
Battery Remaining Longevity: 25 mo
Battery Voltage: 2.93 V
Brady Statistic AP VP Percent: 0.52 %
Brady Statistic AP VS Percent: 0.02 %
Brady Statistic AS VP Percent: 96.97 %
Brady Statistic AS VS Percent: 2.5 %
Brady Statistic RA Percent Paced: 0.54 %
Brady Statistic RV Percent Paced: 26.09 %
Date Time Interrogation Session: 20230322100009
HighPow Impedance: 79 Ohm
Implantable Lead Implant Date: 20181108
Implantable Lead Implant Date: 20181108
Implantable Lead Implant Date: 20181108
Implantable Lead Location: 753858
Implantable Lead Location: 753859
Implantable Lead Location: 753860
Implantable Lead Model: 4298
Implantable Lead Model: 5076
Implantable Pulse Generator Implant Date: 20181108
Lead Channel Impedance Value: 1178 Ohm
Lead Channel Impedance Value: 1197 Ohm
Lead Channel Impedance Value: 1254 Ohm
Lead Channel Impedance Value: 204.14 Ohm
Lead Channel Impedance Value: 216.848
Lead Channel Impedance Value: 270.092
Lead Channel Impedance Value: 278.667
Lead Channel Impedance Value: 302.899
Lead Channel Impedance Value: 399 Ohm
Lead Channel Impedance Value: 418 Ohm
Lead Channel Impedance Value: 418 Ohm
Lead Channel Impedance Value: 475 Ohm
Lead Channel Impedance Value: 589 Ohm
Lead Channel Impedance Value: 703 Ohm
Lead Channel Impedance Value: 760 Ohm
Lead Channel Impedance Value: 779 Ohm
Lead Channel Impedance Value: 779 Ohm
Lead Channel Impedance Value: 836 Ohm
Lead Channel Pacing Threshold Amplitude: 0.5 V
Lead Channel Pacing Threshold Amplitude: 0.75 V
Lead Channel Pacing Threshold Amplitude: 2.5 V
Lead Channel Pacing Threshold Pulse Width: 0.4 ms
Lead Channel Pacing Threshold Pulse Width: 0.4 ms
Lead Channel Pacing Threshold Pulse Width: 1 ms
Lead Channel Sensing Intrinsic Amplitude: 15.375 mV
Lead Channel Sensing Intrinsic Amplitude: 3.125 mV
Lead Channel Setting Pacing Amplitude: 2 V
Lead Channel Setting Pacing Amplitude: 2.5 V
Lead Channel Setting Pacing Amplitude: 3 V
Lead Channel Setting Pacing Pulse Width: 0.4 ms
Lead Channel Setting Pacing Pulse Width: 1 ms
Lead Channel Setting Sensing Sensitivity: 0.3 mV

## 2021-08-08 NOTE — Progress Notes (Signed)
ICD check in clinic to assess rising LV threshold. Normal device function. RA/RV thresholds and sensing consistent with previous device measurements. Manual LV threshold measured 3.5V/1.18m. Vector express ran. Device reprogrammed  from LV1 to LV3 to LV1 to RV coil 2.5V/1.027mwith 1 month loss of battery life. Confirmed changes with Dr. CaCurt BearsWhile Device RN was reviewing testing results with Dr. CaCurt Bearspatient became sweaty, and described visual changes. Presenting rhythm changed from AS/VP at 88 on presentation to AP/VP at lower rate limit. BP 78/62. Reclined in recliner and provided H2O. Symptoms resolved and BP returned to 108/61 at discharge with no complaints except "feel like I need a nap". Vasovagal episode while seated secondary to possible anxiety over potential programming change in device. Impedance trends stable over time. No ventricular arrhythmias. Histogram distribution appropriate for patient and level of activity. Device programmed at appropriate safety margins. Device programmed to optimize intrinsic conduction. Estimated longevity  2 years 1 month. Pt enrolled in remote follow-up with next transmission 09/10/21. Patient education completed including shock plan. Auditory/vibratory alert demonstrated. ?

## 2021-08-10 NOTE — Progress Notes (Signed)
Remote ICD transmission.   

## 2021-08-10 NOTE — Progress Notes (Signed)
Opened in error

## 2021-08-14 ENCOUNTER — Other Ambulatory Visit: Payer: Self-pay | Admitting: Cardiovascular Disease

## 2021-08-14 DIAGNOSIS — L820 Inflamed seborrheic keratosis: Secondary | ICD-10-CM | POA: Diagnosis not present

## 2021-08-14 DIAGNOSIS — L570 Actinic keratosis: Secondary | ICD-10-CM | POA: Diagnosis not present

## 2021-08-14 DIAGNOSIS — L821 Other seborrheic keratosis: Secondary | ICD-10-CM | POA: Diagnosis not present

## 2021-08-14 DIAGNOSIS — I480 Paroxysmal atrial fibrillation: Secondary | ICD-10-CM

## 2021-08-14 DIAGNOSIS — L812 Freckles: Secondary | ICD-10-CM | POA: Diagnosis not present

## 2021-08-14 NOTE — Telephone Encounter (Signed)
Pt last saw Tommye Standard, [place] on 07/12/21, last labs 03/24/21 Creat 0.89, age 76, weight 83.1kg, based on specified criteria pt is on appropriate dosage of Eliquis '5mg'$  BID for afib.  Will refill rx.  ?

## 2021-08-16 ENCOUNTER — Other Ambulatory Visit: Payer: Self-pay | Admitting: Cardiovascular Disease

## 2021-08-20 ENCOUNTER — Other Ambulatory Visit: Payer: Self-pay | Admitting: Cardiovascular Disease

## 2021-08-22 ENCOUNTER — Other Ambulatory Visit: Payer: Self-pay | Admitting: Physician Assistant

## 2021-08-23 NOTE — Telephone Encounter (Signed)
Next Visit: 09/18/2021 ? ?Last Visit: 04/19/2021 ? ?UDS:04/19/2021, UDS consistent with tramadol use. ? ?Narc Agreement: 04/19/2021 ? ?Last Fill: 07/24/2021 ? ?Okay to refill Tramadol? ? ?

## 2021-08-28 ENCOUNTER — Encounter: Payer: Self-pay | Admitting: Gastroenterology

## 2021-08-28 ENCOUNTER — Ambulatory Visit (INDEPENDENT_AMBULATORY_CARE_PROVIDER_SITE_OTHER): Payer: Medicare Other | Admitting: Gastroenterology

## 2021-08-28 VITALS — BP 124/64 | HR 70 | Ht 67.0 in | Wt 187.0 lb

## 2021-08-28 DIAGNOSIS — Z7901 Long term (current) use of anticoagulants: Secondary | ICD-10-CM | POA: Diagnosis not present

## 2021-08-28 DIAGNOSIS — Z9889 Other specified postprocedural states: Secondary | ICD-10-CM | POA: Diagnosis not present

## 2021-08-28 DIAGNOSIS — D135 Benign neoplasm of extrahepatic bile ducts: Secondary | ICD-10-CM | POA: Diagnosis not present

## 2021-08-28 DIAGNOSIS — D132 Benign neoplasm of duodenum: Secondary | ICD-10-CM | POA: Diagnosis not present

## 2021-08-28 DIAGNOSIS — K862 Cyst of pancreas: Secondary | ICD-10-CM | POA: Diagnosis not present

## 2021-08-28 DIAGNOSIS — K317 Polyp of stomach and duodenum: Secondary | ICD-10-CM

## 2021-08-28 NOTE — Patient Instructions (Addendum)
You have been scheduled for an endoscopy. Please follow written instructions given to you at your visit today. ?If you use inhalers (even only as needed), please bring them with you on the day of your procedure. ? ?Thank you for choosing me and [facility]. ? ?Dr. Rush Landmark ? ?

## 2021-08-28 NOTE — Progress Notes (Signed)
? ?GASTROENTEROLOGY OUTPATIENT CLINIC VISIT  ? ?Primary Care Provider ?Biagio Borg, MD ?[place] [address] ?[phone] ? ?Referring Provider ?Dr. Ardis Hughs ? ?Patient Profile: ?[name] is a 76 y.o. 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  The patient presents to the [facility] for an evaluation and management of problem(s) noted below: ? ?Problem List ?1. Ampullary adenoma   ?2. Adenomatous duodenal polyps   ?3. Chronic anticoagulation   ?4. Pancreatic cyst   ?5. History of ERCP   ?6. History of biliary stent insertion   ? ? ? ?History of Present Illness ?Please see prior notes for full details of HPI. ? ?Interval History ?The patient presents for follow-up.  After his ampullectomy, he did represent with alteration of bowel habits and some blood loss anemia that required him to come into the hospital.  An upper endoscopy was performed by Dr. Fuller Plan that showed an ulcer at the area of ampullectomy site but did not require any further therapies.  He continued to have his pancreatic stent stay in place and thus a repeat EGD was performed in November where the pancreas stent was removed by Dr. Ardis Hughs.  Overall, the patient has been doing well since then.  He is scheduled for ERCP follow-up next month.  He occasionally will still have a right lower quadrant abdominal discomfort that comes and goes and does not know if that is result of his duodenal/ampullary polyp.  He has not had any further episodes of bleeding. ? ?GI Review of Systems ?Positive as above ?Negative for dysphagia, odynophagia, nausea, vomiting, melena, hematochezia ? ?Review of Systems ?General: Denies fevers/chills/weight loss unintentionally ?Cardiovascular:  Denies chest pain/palpitations ?Pulmonary: Denies shortness of breath ?Gastroenterological: See HPI ?Genitourinary: Denies darkened urine  ?Hematological: Positive for history of easy bruising/bleeding due to anticoagulation ?Dermatological: Denies jaundice ?Psychological: Mood is stable ? ? ?Medications ?Current Outpatient Medications  ?Medication Sig Dispense Refill  ? acetaminophen (TYLENOL) 650 MG CR tablet Take 650 mg by mouth in the morning and at bedtime.    ? atorvastatin (LIPITOR) 20 MG tablet TAKE 1 TABLET BY MOUTH DAILY 90 tablet 3  ? b complex vitamins tablet Take 1 tablet by mouth daily with lunch.    ? carvedilol (COREG) 25 MG tablet TAKE 1 TABLET BY MOUTH TWICE DAILY 180 tablet 1  ? cholecalciferol (VITAMIN D3) 25 MCG (1000 UNIT) tablet Take 1,000 Units by mouth daily.    ? Coenzyme Q10 (CO Q 10 PO) Take 200 mg by mouth daily with lunch.    ? diclofenac sodium (VOLTAREN) 1 % GEL Apply 1 application topically 4 (four) times daily as needed (pain).   3  ? ELIQUIS 5 MG TABS tablet TAKE 1 TABLET BY MOUTH TWICE DAILY 180 tablet 1  ? Flaxseed, Linseed, (FLAXSEED OIL PO) Take 1 capsule by mouth daily. With Omega 3    ? folic acid (FOLVITE) 884 MCG tablet Take 400 mcg by mouth daily.    ? Glucosamine HCl (GLUCOSAMINE PO) Take 1,500 mg by mouth every evening.    ? hydrocortisone 2.5 % cream Apply 1 application topically 2 (two) times daily as needed (rash on nose).     ? hydroxychloroquine (PLAQUENIL) 200 MG tablet TAKE 1 TABLET(200 MG) BY MOUTH DAILY 90 tablet 0  ? Lutein 20  MG TABS Take 20 mg by mouth daily.    ? Multiple Vitamin (MULTIVITAMIN) capsule Take 1 capsule by mouth daily.      ? nabumetone (RELAFEN) 500 MG tablet Take 500 mg by mouth daily.    ? nitroGLYCERIN (NITROSTAT) 0.4 MG SL tablet Place 1 tablet (0.4 mg total) under the tongue every 5 (five) minutes x 3 doses as needed for chest pain. 25 tablet 0  ? NON FORMULARY CPAP machine with sleep.    ? pantoprazole (PROTONIX) 40 MG tablet Take 1  tablet (40 mg total) by mouth 2 (two) times daily before a meal. TAKE 1 TABLET BY MOUTH EVERY DAY BEFORE BREAKFAST (Patient taking differently: Take 40 mg by mouth daily. TAKE 1 TABLET BY MOUTH EVERY DAY BEFORE BREAKFAST) 60 tablet 6  ? polyethylene glycol (MIRALAX / GLYCOLAX) 17 g packet Take 17 g by mouth daily as needed for mild constipation. 14 each 0  ? Probiotic Product (PROBIOTIC DAILY PO) Take 1 capsule by mouth daily with lunch.     ? RESTASIS 0.05 % ophthalmic emulsion Place 1 drop into both eyes 2 (two) times daily.    ? sacubitril-valsartan (ENTRESTO) 49-51 MG Take 1 tablet by mouth 2 (two) times daily. 180 tablet 3  ? Saw Palmetto, Serenoa repens, (SAW PALMETTO PO) Take 450 mg by mouth every evening.    ? spironolactone (ALDACTONE) 25 MG tablet TAKE 1/2 TABLET(12.5 MG) BY MOUTH DAILY 45 tablet 3  ? sucralfate (CARAFATE) 1 g tablet Take 1 tablet (1 g total) by mouth 4 (four) times daily. 120 tablet 0  ? tamsulosin (FLOMAX) 0.4 MG CAPS capsule Take 0.4 mg by mouth daily.    ? Testosterone 20 % CREA Apply 2 mLs topically daily. Rub on shoulder    ? traMADol (ULTRAM) 50 MG tablet TAKE 1 TABLET(50 MG) BY MOUTH AT BEDTIME AS NEEDED 30 tablet 0  ? TURMERIC PO Take 1 tablet by mouth daily with lunch.     ? vitamin C (ASCORBIC ACID) 500 MG tablet Take 500 mg by mouth daily.    ? zinc gluconate 50 MG tablet Take 50 mg by mouth daily.    ? ?No current facility-administered medications for this visit.  ? ? ?Allergies ?Allergies  ?Allergen Reactions  ? Sulfonamide Derivatives Rash  ?     ? ? ?Histories ?Past Medical History:  ?Diagnosis Date  ? AICD (automatic cardioverter/defibrillator) present   ? Allergic rhinitis   ? Allergy   ? Anginal pain ([place]) 05/26/14  ? chest pain after chasing dog  ? Atypical nevus of back 04/27/2003  ? moderate - mid lower back  ? Basal cell carcinoma 01/26/2008  ? right cheek - MOHs  ? CAD (coronary artery disease)   ? hx of stent- 2005 RCA  ? Cataract   ? beginning stage both eyes  ? CHF  (congestive heart failure) ([place])   ? pacemaker Medtronic    ? Chronic back pain   ? intermittent  ? Constipation   ? Dizziness   ? Dysrhythmia   ? right bundle branch block   ? Esophageal stricture   ? GERD (gastroesophageal reflux disease)   ? Hemoptysis   ? Hiatal hernia   ? History of colonic polyps   ? hyperplastic  ? HTN (hypertension)   ? Hyperlipidemia   ? Hypertrophy of prostate with urinary obstruction and other lower urinary tract symptoms (LUTS)   ? OA (osteoarthritis)   ? OSA (obstructive sleep apnea)   ?  cpap- 10   ? Other specified disorder of stomach and duodenum   ? 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 Pericarditis 07/06/2019  ? Pneumonia   ? Pre-diabetes   ? no medications  ? Shortness of breath   ? with exertion  ? Sleep apnea   ? cpap  ? Testicular hypofunction   ? ?Past Surgical History:  ?Procedure Laterality Date  ? BILIARY STENT PLACEMENT N/A 03/19/2021  ? Procedure: BILIARY STENT PLACEMENT;  Surgeon: Rush Landmark Telford Nab., MD;  Location: [facility];  Service: Gastroenterology;  Laterality: N/A;  ? BIV ICD INSERTION CRT-D N/A 03/27/2017  ? Procedure: BIV ICD INSERTION CRT-D;  Surgeon: Constance Haw, MD;  Location: [facility];  Service: Cardiovascular;  Laterality: N/A;  ? CARDIAC CATHETERIZATION    ? '05, last 2009, showing patent RCA stent  ? COLONOSCOPY  12/2007  ? HYPERPLASTIC POLYP  ? COLONOSCOPY  12/2019  ? COLONOSCOPY WITH PROPOFOL N/A 06/02/2014  ? Procedure: COLONOSCOPY WITH PROPOFOL;  Surgeon: [name], MD;  Location: [facility];  Service: Endoscopy;  Laterality: N/A;  ? CORONARY ANGIOPLASTY  08/2003  ? ENDOSCOPIC MUCOSAL RESECTION  03/19/2021  ? Procedure: ENDOSCOPIC MUCOSAL RESECTION, ampullectomy;  Surgeon: Rush Landmark Telford Nab., MD;  Location: [facility];  Service: Gastroenterology;;  ? ENDOSCOPIC RETROGRADE CHOLANGIOPANCREATOGRAPHY (ERCP) WITH PROPOFOL N/A 03/19/2021  ? Procedure: ENDOSCOPIC RETROGRADE  CHOLANGIOPANCREATOGRAPHY (ERCP) WITH PROPOFOL;  Surgeon: Rush Landmark Telford Nab., MD;  Location: [facility];  Service: Gastroenterology;  Laterality: N/A;  ? ESOPHAGOGASTRODUODENOSCOPY (EGD) WITH PROPOFOL N/A 06/02/2014

## 2021-08-31 ENCOUNTER — Encounter: Payer: Self-pay | Admitting: Gastroenterology

## 2021-08-31 DIAGNOSIS — Z7901 Long term (current) use of anticoagulants: Secondary | ICD-10-CM | POA: Insufficient documentation

## 2021-08-31 DIAGNOSIS — K862 Cyst of pancreas: Secondary | ICD-10-CM | POA: Insufficient documentation

## 2021-08-31 DIAGNOSIS — Z9889 Other specified postprocedural states: Secondary | ICD-10-CM | POA: Insufficient documentation

## 2021-09-03 DIAGNOSIS — K1379 Other lesions of oral mucosa: Secondary | ICD-10-CM | POA: Diagnosis not present

## 2021-09-04 NOTE — Progress Notes (Signed)
? ?Office Visit Note ? ?Patient: [name]             ?Date of Birth: 20-Jan-1946           ?MRN: [id number]             ?PCP: Biagio Borg, MD ?Referring: Biagio Borg, MD ?Visit Date: 09/18/2021 ?Occupation: '@GUAROCC'$ @ ? ?Subjective:  ?Increased pain in joints. ? ?History of Present Illness: [name] is a 76 y.o. male history of severe erosive rheumatoid arthritis and osteoarthritis.  He continues to have pain and stiffness in multiple joints.  He states he was given a prednisone taper by his dentist recently for TMJ discomfort which relieved his symptoms.  He has been taking hydroxychloroquine 200 mg, 1 tablet p.o. daily as he is concerned about the side effect of the medications.  He has been having pain and discomfort in the left trochanteric area for which he has had cortisone injections without much relief.  He is a scheduled to have left total knee replacement in the last week of July with Dr. Maureen Ralphs.  Patient states that he was also seen by Dr. Saintclair Halsted for his back pain.  Dr. Phillip Heal also agreed that the discomfort is coming from left trochanteric bursitis. ? ?Activities of Daily Living:  ?Patient reports morning stiffness for all day. ?Patient Reports nocturnal pain.  ?Difficulty dressing/grooming: Denies ?Difficulty climbing stairs: Reports ?Difficulty getting out of chair: Reports ?Difficulty using hands for taps, buttons, cutlery, and/or writing: Reports ? ?Review of Systems  ?Constitutional:  Positive for fatigue.  ?HENT:  Negative for mouth sores, mouth dryness and nose dryness.   ?Eyes:  Positive for dryness. Negative for pain and itching.  ?Respiratory:  Negative for difficulty breathing.   ?Cardiovascular:  Negative for chest pain and palpitations.  ?Gastrointestinal:  Negative for blood in stool, constipation and diarrhea.  ?Endocrine: Negative for increased urination.  ?Genitourinary:  Negative for difficulty urinating.  ?Musculoskeletal:  Positive for joint pain, joint pain and morning  stiffness. Negative for joint swelling, myalgias, muscle tenderness and myalgias.  ?Skin:  Negative for color change, rash, redness and sensitivity to sunlight.  ?Allergic/Immunologic: Negative for susceptible to infections.  ?Neurological:  Positive for weakness. Negative for dizziness, numbness, headaches and memory loss.  ?Hematological:  Negative for bruising/bleeding tendency.  ?Psychiatric/Behavioral:  Negative for confusion.   ? ?PMFS History:  ?Patient Active Problem List  ? Diagnosis Date Noted  ? Chronic anticoagulation 08/31/2021  ? History of ERCP 08/31/2021  ? History of biliary stent insertion 08/31/2021  ? Pancreatic cyst 08/31/2021  ? GI bleed 03/23/2021  ? Melena   ? Acute upper GI bleed 03/22/2021  ? AF (paroxysmal atrial fibrillation) ([place]) 03/22/2021  ? 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  ? Abnormal findings on esophagogastroduodenoscopy (EGD) 02/03/2021  ? Polyp of duodenum 02/03/2021  ? Axillary abscess 06/26/2020  ? Chronic diarrhea 06/26/2020  ? Fever 01/18/2020  ? Pericarditis 07/06/2019  ? Right hip pain 07/06/2019  ? Acute chest pain 01/24/2019  ? Prediabetes 12/24/2018  ? Rheumatoid arthritis ([place]) 12/24/2018  ? Preventative health care 06/25/2018  ? Incarcerated ventral hernia 03/20/2018  ? DCM (dilated cardiomyopathy) ([place]) 02/11/2017  ? Chronic systolic heart failure ([place]) 01/15/2017  ? History of pneumonia 10/23/2016  ? Osteoarthritis 10/23/2016  ? Primary osteoarthritis of both feet 10/04/2016  ? Primary osteoarthritis of left knee 10/04/2016  ? DDD (degenerative disc disease), lumbar 10/04/2016  ? Hemoptysis 05/27/2016  ? Overweight (BMI 25.0-29.9) 03/14/2016  ? 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 Parotid mass 08/23/2015  ? Dyspnea 11/29/2014  ? Synovial cyst of lumbar facet joint 09/26/2014  ? Incisional hernia, without obstruction or gangrene 12/06/2013  ? Nausea alone 08/27/2013  ? Colon cancer screening 08/27/2013  ? 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 Benign prostatic hyperplasia with  urinary obstruction 06/18/2010  ? Other specified disorder of stomach and duodenum 12/16/2008  ? 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 Gastroesophageal reflux disease 09/02/2008  ? COLONIC POLYPS, HYPERPLASTIC 05/24/2007  ? OSA on CPAP 05/24/2007  ? ALLERGIC RHINITIS 05/24/2007  ? ESOPHAGEAL STRICTURE 05/24/2007  ? HIATAL HERNIA 05/24/2007  ? Hyperlipidemia 02/06/2007  ? Essential hypertension 02/06/2007  ? Coronary artery disease involving native coronary artery of native heart without angina pectoris 02/06/2007  ? Primary osteoarthritis of both hands 02/06/2007  ? Neck pain on right side 02/06/2007  ?  ?Past Medical History:  ?Diagnosis Date  ? AICD (automatic cardioverter/defibrillator) present   ? Allergic rhinitis   ? Allergy   ? Anginal pain ([place]) 05/26/14  ? chest pain after chasing dog  ? Atypical nevus of back 04/27/2003  ? moderate - mid lower back  ? Basal cell carcinoma 01/26/2008  ? right cheek - MOHs  ? CAD (coronary artery disease)   ? hx of stent- 2005 RCA  ? Cataract   ? beginning stage both eyes  ? CHF (congestive heart failure) ([place])   ? pacemaker Medtronic    ? Chronic back pain   ? intermittent  ? Constipation   ? Dizziness   ? Dysrhythmia   ? right bundle branch block   ? Esophageal stricture   ? GERD (gastroesophageal reflux disease)   ? Hemoptysis   ? Hiatal hernia   ? History of colonic polyps   ? hyperplastic  ? HTN (hypertension)   ? Hyperlipidemia   ? Hypertrophy of prostate with urinary obstruction and other lower urinary tract symptoms (LUTS)   ? OA (osteoarthritis)   ? OSA (obstructive sleep apnea)   ? cpap- 10   ? Other specified disorder of stomach and duodenum   ? 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 Pericarditis 07/06/2019  ? Pneumonia   ? Pre-diabetes   ? no medications  ? Shortness of breath   ? with exertion  ? Sleep apnea   ? cpap  ? Testicular hypofunction   ?  ?Family History  ?Problem Relation Age of Onset  ? Heart  disease Mother   ? Coronary artery disease Father   ? Diabetes Father   ? Sudden death Father   ?     due to heart disease  ? Heart disease Father   ? COPD Brother   ? Arthritis Brother   ? Heart failure Brother   ? Cancer Brother   ?     lung   ? Stomach cancer Paternal Grandmother   ? Arthritis Daughter   ? Hypertension Other   ? Colon cancer Neg Hx   ? Esophageal cancer Neg Hx   ? Colon polyps Neg Hx   ? Ulcerative colitis Neg Hx   ? Liver disease Neg Hx   ? Pancreatic cancer Neg Hx   ? Inflammatory bowel disease Neg Hx   ? Rectal cancer Neg Hx   ? ?Past Surgical History:  ?Procedure Laterality Date  ? BILIARY STENT PLACEMENT N/A 03/19/2021  ? Procedure: BILIARY STENT PLACEMENT;  Surgeon: Rush Landmark Telford Nab., MD;  Location: [facility];  Service: Gastroenterology;  Laterality: N/A;  ? BIV ICD INSERTION CRT-D N/A 03/27/2017  ? Procedure: BIV ICD INSERTION CRT-D;  Surgeon: Constance Haw, MD;  Location: [facility];  Service: Cardiovascular;  Laterality: N/A;  ? CARDIAC CATHETERIZATION    ? '05, last 2009, showing patent RCA stent  ? COLONOSCOPY  12/2007  ? HYPERPLASTIC POLYP  ? COLONOSCOPY  12/2019  ? COLONOSCOPY WITH PROPOFOL N/A 06/02/2014  ? Procedure: COLONOSCOPY WITH PROPOFOL;  Surgeon: [name], MD;  Location: [facility];  Service: Endoscopy;  Laterality: N/A;  ? CORONARY ANGIOPLASTY  08/2003  ? ENDOSCOPIC MUCOSAL RESECTION  03/19/2021  ? Procedure: ENDOSCOPIC MUCOSAL RESECTION, ampullectomy;  Surgeon: Rush Landmark Telford Nab., MD;  Location: [facility];  Service: Gastroenterology;;  ? ENDOSCOPIC RETROGRADE CHOLANGIOPANCREATOGRAPHY (ERCP) WITH PROPOFOL N/A 03/19/2021  ? Procedure: ENDOSCOPIC RETROGRADE CHOLANGIOPANCREATOGRAPHY (ERCP) WITH PROPOFOL;  Surgeon: Rush Landmark Telford Nab., MD;  Location: [facility];  Service: Gastroenterology;  Laterality: N/A;  ? ESOPHAGOGASTRODUODENOSCOPY (EGD) WITH PROPOFOL N/A 06/02/2014  ? Procedure: ESOPHAGOGASTRODUODENOSCOPY (EGD) WITH PROPOFOL;   Surgeon: [name], MD;  Location: [facility];  Service: Endoscopy;  Laterality: N/A;  ? ESOPHAGOGASTRODUODENOSCOPY (EGD) WITH PROPOFOL N/A 03/19/2021  ? Procedure: ESOPHAGOGASTRODUODENOSCOPY (EGD) WITH P

## 2021-09-05 ENCOUNTER — Other Ambulatory Visit: Payer: Self-pay | Admitting: Physician Assistant

## 2021-09-05 DIAGNOSIS — M1990 Unspecified osteoarthritis, unspecified site: Secondary | ICD-10-CM

## 2021-09-05 NOTE — Telephone Encounter (Signed)
Next Visit: 09/18/2021 ?  ?Last Visit: 04/19/2021 ?  ?Labs: 03/24/2021 RBC 4.00, Hgb 12.0, Hct 36.1,Glucose 101, Total Protein 6.1, Total Bilirubin 1.6 ?  ?Eye exam: 03/14/2021 normal  ?  ?Current Dose per office note 04/19/2021: PLQ 200 mg 1 tablet po qd ?  ?[id number] arthritis involving multiple sites with positive rheumatoid factor  ?  ?Last Fill: 06/05/2021 ?  ?Okay to refill Plaquenil?  ?

## 2021-09-10 ENCOUNTER — Ambulatory Visit (INDEPENDENT_AMBULATORY_CARE_PROVIDER_SITE_OTHER): Payer: Medicare Other

## 2021-09-10 DIAGNOSIS — I5022 Chronic systolic (congestive) heart failure: Secondary | ICD-10-CM

## 2021-09-10 DIAGNOSIS — Z95810 Presence of automatic (implantable) cardiac defibrillator: Secondary | ICD-10-CM | POA: Diagnosis not present

## 2021-09-13 ENCOUNTER — Other Ambulatory Visit: Payer: Self-pay | Admitting: Physician Assistant

## 2021-09-13 ENCOUNTER — Telehealth: Payer: Self-pay

## 2021-09-13 MED ORDER — ENTRESTO 49-51 MG PO TABS: 1.0000 | ORAL_TABLET | Freq: Two times a day (BID) | ORAL | 3 refills | Status: DC

## 2021-09-13 NOTE — Progress Notes (Signed)
EPIC Encounter for ICM Monitoring ? ?Patient Name: [name] is a 76 y.o. male ?Date: 09/13/2021 ?Primary Care Physican: Biagio Borg, MD ?Primary Cardiologist: Burt Knack ?Electrophysiologist: Camnitz ?Bi-V Pacing:   97.1%  Effective 96.8%      ?07/04/2021 Weight: 197 lbs  ?  ?Time in AT/AF  0.3 hr/day (1.4%) ?Longest AT/AF 11 hours ?  ?                                                         ?Attempted call to patient and unable to reach.  Left detailed message per DPR regarding transmission. Transmission reviewed.   ?  ?Optivol thoracic impedance suggesting normal fluid levels.    ?  ?Prescribed: Spirolactone 25 mg take 0.5 tablet daily. ?  ?Recommendations:  Left voice mail with ICM number and encouraged to call if experiencing any fluid symptoms. ?  ?Follow-up plan: ICM clinic phone appointment on 10/16/2021.   91 day device clinic remote transmission 11/01/2021.     ?  ?EP/Cardiology Office Visits:    10/22/2021 with Dr Burt Knack.   ?  ?Copy of ICM check sent to Dr. Curt Bears.  ? ?3 month ICM trend: 09/10/2021. ? ? ? ?12-14 Month ICM trend:  ? ? ? ?[name], RN ?09/13/2021 ?1:51 PM ? ?

## 2021-09-13 NOTE — Telephone Encounter (Signed)
Remote ICM transmission received.  Attempted call to patient regarding ICM remote transmission and left detailed message per DPR.  Advised to return call for any fluid symptoms or questions. Next ICM remote transmission scheduled 10/16/2021.   ? ?

## 2021-09-18 ENCOUNTER — Ambulatory Visit (INDEPENDENT_AMBULATORY_CARE_PROVIDER_SITE_OTHER): Payer: Medicare Other | Admitting: Rheumatology

## 2021-09-18 ENCOUNTER — Encounter (HOSPITAL_COMMUNITY): Payer: Self-pay | Admitting: Gastroenterology

## 2021-09-18 ENCOUNTER — Encounter: Payer: Self-pay | Admitting: Rheumatology

## 2021-09-18 ENCOUNTER — Other Ambulatory Visit: Payer: Self-pay | Admitting: *Deleted

## 2021-09-18 VITALS — BP 103/68 | HR 80 | Ht 67.0 in | Wt 189.0 lb

## 2021-09-18 DIAGNOSIS — G8929 Other chronic pain: Secondary | ICD-10-CM

## 2021-09-18 DIAGNOSIS — Z96651 Presence of right artificial knee joint: Secondary | ICD-10-CM

## 2021-09-18 DIAGNOSIS — M19042 Primary osteoarthritis, left hand: Secondary | ICD-10-CM

## 2021-09-18 DIAGNOSIS — Z8669 Personal history of other diseases of the nervous system and sense organs: Secondary | ICD-10-CM

## 2021-09-18 DIAGNOSIS — M19072 Primary osteoarthritis, left ankle and foot: Secondary | ICD-10-CM

## 2021-09-18 DIAGNOSIS — M0579 Rheumatoid arthritis with rheumatoid factor of multiple sites without organ or systems involvement: Secondary | ICD-10-CM

## 2021-09-18 DIAGNOSIS — Z5181 Encounter for therapeutic drug level monitoring: Secondary | ICD-10-CM

## 2021-09-18 DIAGNOSIS — M51369 Other intervertebral disc degeneration, lumbar region without mention of lumbar back pain or lower extremity pain: Secondary | ICD-10-CM

## 2021-09-18 DIAGNOSIS — M533 Sacrococcygeal disorders, not elsewhere classified: Secondary | ICD-10-CM

## 2021-09-18 DIAGNOSIS — M19071 Primary osteoarthritis, right ankle and foot: Secondary | ICD-10-CM

## 2021-09-18 DIAGNOSIS — R29890 Loss of height: Secondary | ICD-10-CM | POA: Diagnosis not present

## 2021-09-18 DIAGNOSIS — M8589 Other specified disorders of bone density and structure, multiple sites: Secondary | ICD-10-CM

## 2021-09-18 DIAGNOSIS — Z79899 Other long term (current) drug therapy: Secondary | ICD-10-CM

## 2021-09-18 DIAGNOSIS — Z8719 Personal history of other diseases of the digestive system: Secondary | ICD-10-CM

## 2021-09-18 DIAGNOSIS — M1712 Unilateral primary osteoarthritis, left knee: Secondary | ICD-10-CM | POA: Diagnosis not present

## 2021-09-18 DIAGNOSIS — M19041 Primary osteoarthritis, right hand: Secondary | ICD-10-CM

## 2021-09-18 DIAGNOSIS — M7062 Trochanteric bursitis, left hip: Secondary | ICD-10-CM

## 2021-09-18 DIAGNOSIS — M5136 Other intervertebral disc degeneration, lumbar region: Secondary | ICD-10-CM | POA: Diagnosis not present

## 2021-09-18 DIAGNOSIS — Z860100 Personal history of colon polyps, unspecified: Secondary | ICD-10-CM

## 2021-09-18 DIAGNOSIS — Z8679 Personal history of other diseases of the circulatory system: Secondary | ICD-10-CM

## 2021-09-18 DIAGNOSIS — Z950 Presence of cardiac pacemaker: Secondary | ICD-10-CM

## 2021-09-18 DIAGNOSIS — Z87438 Personal history of other diseases of male genital organs: Secondary | ICD-10-CM

## 2021-09-18 DIAGNOSIS — Z8639 Personal history of other endocrine, nutritional and metabolic disease: Secondary | ICD-10-CM

## 2021-09-18 DIAGNOSIS — I2510 Atherosclerotic heart disease of native coronary artery without angina pectoris: Secondary | ICD-10-CM

## 2021-09-18 NOTE — Patient Instructions (Addendum)
Iliotibial Band Syndrome Rehab ?Ask your health care provider which exercises are safe for you. Do exercises exactly as told by your health care provider and adjust them as directed. It is normal to feel mild stretching, pulling, tightness, or discomfort as you do these exercises. Stop right away if you feel sudden pain or your pain gets significantly worse. Do not begin these exercises until told by your health care provider. ?Stretching and range-of-motion exercises ?These exercises warm up your muscles and joints and improve the movement and flexibility of your hip and pelvis. ?Quadriceps stretch, prone ? ?Lie on your abdomen (prone position) on a firm surface, such as a bed or padded floor. ?Bend your left / right knee and reach back to hold your ankle or pant leg. If you cannot reach your ankle or pant leg, loop a belt around your foot and grab the belt instead. ?Gently pull your heel toward your buttocks. Your knee should not slide out to the side. You should feel a stretch in the front of your thigh and knee (quadriceps). ?Hold this position for __________ seconds. ?Repeat __________ times. Complete this exercise __________ times a day. ?Iliotibial band stretch ?An iliotibial band is a strong band of muscle tissue that runs from the outer side of your hip to the outer side of your thigh and knee. ?Lie on your side with your left / right leg in the top position. ?Bend both of your knees and grab your left / right ankle. Stretch out your bottom arm to help you balance. ?Slowly bring your top knee back so your thigh goes behind your trunk. ?Slowly lower your top leg toward the floor until you feel a gentle stretch on the outside of your left / right hip and thigh. If you do not feel a stretch and your knee will not fall farther, place the heel of your other foot on top of your knee and pull your knee down toward the floor with your foot. ?Hold this position for __________ seconds. ?Repeat __________ times.  Complete this exercise __________ times a day. ?Strengthening exercises ?These exercises build strength and endurance in your hip and pelvis. Endurance is the ability to use your muscles for a long time, even after they get tired. ?Straight leg raises, side-lying ?This exercise strengthens the muscles that rotate the leg at the hip and move it away from your body (hip abductors). ?Lie on your side with your left / right leg in the top position. Lie so your head, shoulder, hip, and knee line up. You may bend your bottom knee to help you balance. ?Roll your hips slightly forward so your hips are stacked directly over each other and your left / right knee is facing forward. ?Tense the muscles in your outer thigh and lift your top leg 4-6 inches (10-15 cm). ?Hold this position for __________ seconds. ?Slowly lower your leg to return to the starting position. Let your muscles relax completely before doing another repetition. ?Repeat __________ times. Complete this exercise __________ times a day. ?Leg raises, prone ?This exercise strengthens the muscles that move the hips backward (hip extensors). ?Lie on your abdomen (prone position) on your bed or a firm surface. You can put a pillow under your hips if that is more comfortable for your lower back. ?Bend your left / right knee so your foot is straight up in the air. ?Squeeze your buttocks muscles and lift your left / right thigh off the bed. Do not let your back arch. ?Tense  your thigh muscle as hard as you can without increasing any knee pain. ?Hold this position for __________ seconds. ?Slowly lower your leg to return to the starting position and allow it to relax completely. ?Repeat __________ times. Complete this exercise __________ times a day. ?Hip hike ?Stand sideways on a bottom step. Stand on your left / right leg with your other foot unsupported next to the step. You can hold on to a railing or wall for balance if needed. ?Keep your knees straight and your  torso square. Then lift your left / right hip up toward the ceiling. ?Slowly let your left / right hip lower toward the floor, past the starting position. Your foot should get closer to the floor. Do not lean or bend your knees. ?Repeat __________ times. Complete this exercise __________ times a day. ?This information is not intended to replace advice given to you by your health care provider. Make sure you discuss any questions you have with your health care provider. ?Document Revised: 07/14/2019 Document Reviewed: 07/14/2019 ?Elsevier Patient Education ? [place] ?We placed an order today for your standing lab work.  ? ?Please have your standing labs drawn in October ? ?If possible, please have your labs drawn 2 weeks prior to your appointment so that the provider can discuss your results at your appointment. ? ?Please note that you may see your imaging and lab results in [facility] before we have reviewed them. ?We may be awaiting multiple results to interpret others before contacting you. ?Please allow our office up to 72 hours to thoroughly review all of the results before contacting the office for clarification of your results. ? ?We have open lab daily: ?Monday through Thursday from 1:30-4:30 PM and Friday from 1:30-4:00 PM ?at the office of Dr. Bo Merino, [facility].   ?Please be advised, all patients with office appointments requiring lab work will take precedent over walk-in lab work.  ?If possible, please come for your lab work on Monday and Friday afternoons, as you may experience shorter wait times. ?The office is located at [address], [place], [place], [place] [address] ?No appointment is necessary.   ?Labs are drawn by [facility]. Please bring your co-pay at the time of your lab draw.  You may receive a bill from [facility] for your lab work. ? ?Please note if you are on Hydroxychloroquine and and an order has been placed for a Hydroxychloroquine level, you  will need to have it drawn 4 hours or more after your last dose. ? ?If you wish to have your labs drawn at another location, please call the office 24 hours in advance to send orders. ? ?If you have any questions regarding directions or hours of operation,  ?please call [phone].   ?As a reminder, please drink plenty of water prior to coming for your lab work. Thanks!  ?

## 2021-09-18 NOTE — Progress Notes (Signed)
Attempted to obtain medical history via telephone, unable to reach at this time. I left a voicemail to return pre surgical testing department's phone call.  

## 2021-09-20 ENCOUNTER — Other Ambulatory Visit: Payer: Self-pay | Admitting: Physician Assistant

## 2021-09-20 ENCOUNTER — Telehealth: Payer: Self-pay

## 2021-09-20 LAB — CBC WITH DIFFERENTIAL/PLATELET
Absolute Monocytes: 728 cells/uL (ref 200–950)
Basophils Absolute: 27 cells/uL (ref 0–200)
Basophils Relative: 0.4 %
Eosinophils Absolute: 88 cells/uL (ref 15–500)
Eosinophils Relative: 1.3 %
HCT: 42 % (ref 38.5–50.0)
Hemoglobin: 14.1 g/dL (ref 13.2–17.1)
Lymphs Abs: 1374 cells/uL (ref 850–3900)
MCH: 30.2 pg (ref 27.0–33.0)
MCHC: 33.6 g/dL (ref 32.0–36.0)
MCV: 89.9 fL (ref 80.0–100.0)
MPV: 10 fL (ref 7.5–12.5)
Monocytes Relative: 10.7 %
Neutro Abs: 4583 cells/uL (ref 1500–7800)
Neutrophils Relative %: 67.4 %
Platelets: 246 10*3/uL (ref 140–400)
RBC: 4.67 10*6/uL (ref 4.20–5.80)
RDW: 13.2 % (ref 11.0–15.0)
Total Lymphocyte: 20.2 %
WBC: 6.8 10*3/uL (ref 3.8–10.8)

## 2021-09-20 LAB — COMPLETE METABOLIC PANEL WITH GFR
AG Ratio: 2.3 (calc) (ref 1.0–2.5)
ALT: 19 U/L (ref 9–46)
AST: 18 U/L (ref 10–35)
Albumin: 4.4 g/dL (ref 3.6–5.1)
Alkaline phosphatase (APISO): 61 U/L (ref 35–144)
BUN: 16 mg/dL (ref 7–25)
CO2: 30 mmol/L (ref 20–32)
Calcium: 9.8 mg/dL (ref 8.6–10.3)
Chloride: 105 mmol/L (ref 98–110)
Creat: 0.86 mg/dL (ref 0.70–1.28)
Globulin: 1.9 g/dL (calc) (ref 1.9–3.7)
Glucose, Bld: 101 mg/dL — ABNORMAL HIGH (ref 65–99)
Potassium: 4.3 mmol/L (ref 3.5–5.3)
Sodium: 141 mmol/L (ref 135–146)
Total Bilirubin: 1 mg/dL (ref 0.2–1.2)
Total Protein: 6.3 g/dL (ref 6.1–8.1)
eGFR: 89 mL/min/{1.73_m2} (ref >=60)

## 2021-09-20 LAB — DRUG MONITOR, TRAMADOL,QN, URINE
Desmethyltramadol: 5738 ng/mL — ABNORMAL HIGH (ref <100)
Tramadol: 6397 ng/mL — ABNORMAL HIGH (ref <100)

## 2021-09-20 LAB — DRUG MONITOR, PANEL 5, W/CONF, URINE
Amphetamines: NEGATIVE ng/mL (ref <500)
Barbiturates: NEGATIVE ng/mL (ref <300)
Benzodiazepines: NEGATIVE ng/mL (ref <100)
Cocaine Metabolite: NEGATIVE ng/mL (ref <150)
Creatinine: 250.3 mg/dL (ref >=20.0)
Marijuana Metabolite: NEGATIVE ng/mL (ref <20)
Methadone Metabolite: NEGATIVE ng/mL (ref <100)
Opiates: NEGATIVE ng/mL (ref <100)
Oxidant: NEGATIVE ug/mL (ref <200)
Oxycodone: NEGATIVE ng/mL (ref <100)
pH: 5.5 (ref 4.5–9.0)

## 2021-09-20 LAB — DM TEMPLATE

## 2021-09-20 NOTE — Telephone Encounter (Signed)
Left detailed message for pt cancelling his appt for Monday at Dr. Jannifer Rodney is sick. He was notified that [name], his nurse, will contact  him to reschedule his appt. Instructed him to call the office with any questions. ?

## 2021-09-20 NOTE — Progress Notes (Signed)
UDS is c/w  Tramadol use.

## 2021-09-21 NOTE — Telephone Encounter (Signed)
Next Visit: 02/19/2022 ? ?Last Visit: 09/18/2021 ? ?UDS:09/18/2021, UDS is c/w  Tramadol use. ? ?Narc Agreement: 04/19/2021 ? ?Last Fill: 08/23/2021, Tramadol '50mg'$  by mouth at bedtime as needed. ? ?Okay to refill Tramadol? ? ?

## 2021-09-21 NOTE — Telephone Encounter (Signed)
Patient called with questions regarding cancelled procedure at [facility]. Please advise. ?

## 2021-09-21 NOTE — Telephone Encounter (Signed)
Spoke with pt and he knows we are waiting to hear from Dr. Jannifer Rodney as to when he can be rescheduled. He would like a call on Monday if possible. ?

## 2021-09-24 ENCOUNTER — Ambulatory Visit (HOSPITAL_COMMUNITY): Admission: RE | Admit: 2021-09-24 | Payer: Medicare Other | Source: Home / Self Care | Admitting: Gastroenterology

## 2021-09-24 SURGERY — ENDOSCOPIC RETROGRADE CHOLANGIOPANCREATOGRAPHY (ERCP) WITH PROPOFOL
Anesthesia: General

## 2021-09-24 NOTE — Telephone Encounter (Signed)
ERCP scheduled, pt instructed and medications reviewed.  Patient instructions mailed to home and sent to My Chart.  Patient to call with any questions or concerns.  

## 2021-09-24 NOTE — Telephone Encounter (Signed)
ERCP has been rescheduled to 7/13 at [facility] at 730 am with GM ?

## 2021-09-26 ENCOUNTER — Other Ambulatory Visit: Payer: Self-pay | Admitting: Internal Medicine

## 2021-09-26 ENCOUNTER — Ambulatory Visit (INDEPENDENT_AMBULATORY_CARE_PROVIDER_SITE_OTHER): Payer: Medicare Other | Admitting: Internal Medicine

## 2021-09-26 ENCOUNTER — Ambulatory Visit (INDEPENDENT_AMBULATORY_CARE_PROVIDER_SITE_OTHER): Payer: Medicare Other

## 2021-09-26 VITALS — BP 122/76 | HR 75 | Temp 98.0°F | Ht 67.0 in | Wt 191.0 lb

## 2021-09-26 DIAGNOSIS — N320 Bladder-neck obstruction: Secondary | ICD-10-CM

## 2021-09-26 DIAGNOSIS — I2510 Atherosclerotic heart disease of native coronary artery without angina pectoris: Secondary | ICD-10-CM

## 2021-09-26 DIAGNOSIS — M25552 Pain in left hip: Secondary | ICD-10-CM

## 2021-09-26 DIAGNOSIS — E782 Mixed hyperlipidemia: Secondary | ICD-10-CM | POA: Diagnosis not present

## 2021-09-26 DIAGNOSIS — M25551 Pain in right hip: Secondary | ICD-10-CM | POA: Diagnosis not present

## 2021-09-26 DIAGNOSIS — I10 Essential (primary) hypertension: Secondary | ICD-10-CM

## 2021-09-26 DIAGNOSIS — E538 Deficiency of other specified B group vitamins: Secondary | ICD-10-CM | POA: Diagnosis not present

## 2021-09-26 DIAGNOSIS — Z125 Encounter for screening for malignant neoplasm of prostate: Secondary | ICD-10-CM | POA: Diagnosis not present

## 2021-09-26 DIAGNOSIS — E559 Vitamin D deficiency, unspecified: Secondary | ICD-10-CM | POA: Diagnosis not present

## 2021-09-26 DIAGNOSIS — M160 Bilateral primary osteoarthritis of hip: Secondary | ICD-10-CM | POA: Diagnosis not present

## 2021-09-26 DIAGNOSIS — R7303 Prediabetes: Secondary | ICD-10-CM

## 2021-09-26 NOTE — Progress Notes (Signed)
Patient ID: [name], male   DOB: Sep 28, 1945, 76 y.o.   MRN: [id number] ? ? ? ?    Chief Complaint: follow up HTN, hyperglycemia, left hip pain and left knee pain  ? ?     HPI:  [name] is a 76 y.o. male here overall doing ok,  but c/o persistent left hip pain asking for hip film   Has seen ortho with some tenderness and received cortisone but no help.  Has known left knee end stage DJD for left TKR soon, pain worse to walk, better to lie down, mild, intermittent, nothing else really makes better or worse for the past 3 mo.  Saw rheum last wk as well without change in tx per pt.   Pt denies chest pain, increased sob or doe, wheezing, orthopnea, PND, increased LE swelling, palpitations, dizziness or syncope.   Pt denies polydipsia, polyuria, or new focal neuro s/s.    Pt denies fever, wt loss, night sweats, loss of appetite, or other constitutional symptoms   ?      ?Wt Readings from Last 3 Encounters:  ?09/26/21 191 lb (86.6 kg)  ?09/18/21 189 lb (85.7 kg)  ?08/28/21 187 lb (84.8 kg)  ? ?BP Readings from Last 3 Encounters:  ?09/26/21 122/76  ?09/18/21 103/68  ?08/28/21 124/64  ? ?      ?Past Medical History:  ?Diagnosis Date  ? AICD (automatic cardioverter/defibrillator) present   ? Allergic rhinitis   ? Allergy   ? Anginal pain ([place]) 05/26/14  ? chest pain after chasing dog  ? Atypical nevus of back 04/27/2003  ? moderate - mid lower back  ? Basal cell carcinoma 01/26/2008  ? right cheek - MOHs  ? CAD (coronary artery disease)   ? hx of stent- 2005 RCA  ? Cataract   ? beginning stage both eyes  ? CHF (congestive heart failure) ([place])   ? pacemaker Medtronic    ? Chronic back pain   ? intermittent  ? Constipation   ? Dizziness   ? Dysrhythmia   ? right bundle branch block   ? Esophageal stricture   ? GERD (gastroesophageal reflux disease)   ? Hemoptysis   ? Hiatal hernia   ? History of colonic polyps   ? hyperplastic  ? HTN (hypertension)   ? Hyperlipidemia   ? Hypertrophy of prostate with urinary  obstruction and other lower urinary tract symptoms (LUTS)   ? OA (osteoarthritis)   ? OSA (obstructive sleep apnea)   ? cpap- 10   ? Other specified disorder of stomach and duodenum   ? 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 Pericarditis 07/06/2019  ? Pneumonia   ? Pre-diabetes   ? no medications  ? Shortness of breath   ? with exertion  ? Sleep apnea   ? cpap  ? Testicular hypofunction   ? ?Past Surgical History:  ?Procedure Laterality Date  ? BILIARY STENT PLACEMENT N/A 03/19/2021  ? Procedure: BILIARY STENT PLACEMENT;  Surgeon: Rush Landmark Telford Nab., MD;  Location: [facility];  Service: Gastroenterology;  Laterality: N/A;  ? BIV ICD INSERTION CRT-D N/A 03/27/2017  ? Procedure: BIV ICD INSERTION CRT-D;  Surgeon: Constance Haw, MD;  Location: [facility];  Service: Cardiovascular;  Laterality: N/A;  ? CARDIAC CATHETERIZATION    ? '05, last 2009, showing patent RCA stent  ? COLONOSCOPY  12/2007  ? HYPERPLASTIC POLYP  ? COLONOSCOPY  12/2019  ? COLONOSCOPY WITH PROPOFOL  N/A 06/02/2014  ? Procedure: COLONOSCOPY WITH PROPOFOL;  Surgeon: [name], MD;  Location: [facility];  Service: Endoscopy;  Laterality: N/A;  ? CORONARY ANGIOPLASTY  08/2003  ? ENDOSCOPIC MUCOSAL RESECTION  03/19/2021  ? Procedure: ENDOSCOPIC MUCOSAL RESECTION, ampullectomy;  Surgeon: Rush Landmark Telford Nab., MD;  Location: [facility];  Service: Gastroenterology;;  ? ENDOSCOPIC RETROGRADE CHOLANGIOPANCREATOGRAPHY (ERCP) WITH PROPOFOL N/A 03/19/2021  ? Procedure: ENDOSCOPIC RETROGRADE CHOLANGIOPANCREATOGRAPHY (ERCP) WITH PROPOFOL;  Surgeon: Rush Landmark Telford Nab., MD;  Location: [facility];  Service: Gastroenterology;  Laterality: N/A;  ? ESOPHAGOGASTRODUODENOSCOPY (EGD) WITH PROPOFOL N/A 06/02/2014  ? Procedure: ESOPHAGOGASTRODUODENOSCOPY (EGD) WITH PROPOFOL;  Surgeon: [name], MD;  Location: [facility];  Service: Endoscopy;  Laterality: N/A;  ? ESOPHAGOGASTRODUODENOSCOPY (EGD)  WITH PROPOFOL N/A 03/19/2021  ? Procedure: ESOPHAGOGASTRODUODENOSCOPY (EGD) WITH PROPOFOL;  Surgeon: Rush Landmark Telford Nab., MD;  Location: [facility];  Service: Gastroenterology;  Laterality: N/A;  ? ESOPHAGOGASTRODUODENOSCOPY (EGD) WITH PROPOFOL N/A 03/23/2021  ? Procedure: ESOPHAGOGASTRODUODENOSCOPY (EGD) WITH PROPOFOL;  Surgeon: Ladene Artist, MD;  Location: [facility];  Service: Endoscopy;  Laterality: N/A;  ? hernia surgery x 3    ? Bilateral Inguinal, Umbicial  ? INSERTION OF MESH N/A 03/20/2018  ? Procedure: INSERTION OF MESH;  Surgeon: Alphonsa Overall, MD;  Location: [facility];  Service: General;  Laterality: N/A;  ? IRRIGATION AND DEBRIDEMENT ABSCESS Left 08/14/2012  ? Procedure: IRRIGATION AND DEBRIDEMENT LEFT INGUINAL BOIL ;  Surgeon: Ailene Rud, MD;  Location: [facility];  Service: Urology;  Laterality: Left;  ? JOINT REPLACEMENT Right 2017  ? KNEE ARTHROPLASTY    ? LEFT HEART CATH AND CORONARY ANGIOGRAPHY N/A 01/24/2019  ? Procedure: LEFT HEART CATH AND CORONARY ANGIOGRAPHY;  Surgeon: Martinique, Peter M, MD;  Location: [facility];  Service: Cardiovascular;  Laterality: N/A;  ? LEFT HEART CATHETERIZATION WITH CORONARY ANGIOGRAM N/A 05/26/2014  ? Procedure: LEFT HEART CATHETERIZATION WITH CORONARY ANGIOGRAM;  Surgeon: Blane Ohara, MD;  Location: [facility] C[facility] LAB;  Service: Cardiovascular;  Laterality: N/A;  ? LUMBAR LAMINECTOMY/DECOMPRESSION MICRODISCECTOMY Left 09/26/2014  ? Procedure: Lumbar Laminectomy for resection of synovial cyst  Lumbar five- sacral one left;  Surgeon: Kary Kos, MD;  Location: [facility];  Service: Neurosurgery;  Laterality: Left;  ? MOLE REMOVAL Left 03/20/2018  ? Procedure: MOLE REMOVAL;  Surgeon: Alphonsa Overall, MD;  Location: [facility];  Service: General;  Laterality: Left;  ? Oral surg to removed growth from ?sinus  10/2010  ? ?Dermoid removed by DrRiggs  ? PACEMAKER INSERTION  04/03/2017  ? PANCREATIC STENT PLACEMENT  03/19/2021  ? Procedure: PANCREATIC STENT PLACEMENT;   Surgeon: Rush Landmark Telford Nab., MD;  Location: [facility];  Service: Gastroenterology;;  ? POLYPECTOMY    ? RCA stenting    ? '05 RCA  ? right toe surgery  Right   ? Cyst   ? RIGHT/LEFT HEART CATH AND CORONARY ANGIOGRAPHY N/A 01/15/2017  ? Procedure: RIGHT/LEFT HEART CATH AND CORONARY ANGIOGRAPHY;  Surgeon: [name], MD;  Location: [facility] CV LAB;  Service: Cardiovascular;  Laterality: N/A;  ? s/p right knee arthroscopy  2005  ? SPHINCTEROTOMY  03/19/2021  ? Procedure: SPHINCTEROTOMY;  Surgeon: Rush Landmark Telford Nab., MD;  Location: [facility];  Service: Gastroenterology;;  ? [place] INJECTION  03/19/2021  ? Procedure: SUBMUCOSAL LIFTING INJECTION;  Surgeon: Rush Landmark Telford Nab., MD;  Location: [facility];  Service: Gastroenterology;;  ? thumb surgery Right   ? TOTAL KNEE ARTHROPLASTY Right 03/12/2016  ? Procedure: RIGHT TOTAL KNEE ARTHROPLASTY;  Surgeon: Paralee Cancel, MD;  Location: [facility];  Service: Orthopedics;  Laterality: Right;  ? UPPER ESOPHAGEAL ENDOSCOPIC ULTRASOUND (EUS) N/A 03/19/2021  ? Procedure: UPPER ESOPHAGEAL ENDOSCOPIC ULTRASOUND (EUS);  Surgeon: Irving Copas., MD;  Location: [facility];  Service: Gastroenterology;  Laterality: N/A;  ? UPPER GASTROINTESTINAL ENDOSCOPY    ? VENTRAL HERNIA REPAIR N/A 03/20/2018  ? Procedure: LAPAROSCOPIC VENTRAL INCISIONAL  HERNIA ERAS PATHWAY;  Surgeon: Alphonsa Overall, MD;  Location: [facility];  Service: General;  Laterality: N/A;  ? ? reports that he quit smoking about 31 years ago. His smoking use included cigarettes. He has never been exposed to tobacco smoke. He has never used smokeless tobacco. He reports current alcohol use. He reports that he does not use drugs. ?family history includes Arthritis in his brother and daughter; COPD in his brother; Cancer in his brother; Coronary artery disease in his father; Diabetes in his father; Heart disease in his father and mother; Heart failure in his brother; Hypertension in an  other family member; Stomach cancer in his paternal grandmother; Sudden death in his father. ?Allergies  ?Allergen Reactions  ? Sulfonamide Derivatives Rash  ?     ? ?Current Outpatient Medications on File Prior to [facility]

## 2021-09-26 NOTE — Patient Instructions (Signed)
Please continue all other medications as before, and refills have been done if requested. ? ?Please have the pharmacy call with any other refills you may need. ? ?Please continue your efforts at being more active, low cholesterol diet, and weight control. ? ?You are otherwise up to date with prevention measures today. ? ?Please keep your appointments with your specialists as you may have planned ? ?Please go to the XRAY Department in the first floor for the x-ray testing ? ?Please go to the LAB at the blood drawing area for the tests to be done ? ?You will be contacted by phone if any changes need to be made immediately.  Otherwise, you will receive a letter about your results with an explanation, but please check with MyChart first. ? ?This message exchange with [place] may be billed to your insurance if the message requires medical expertise and takes more than a few minutes of your provider's time. Your copay and deductible will apply to this charge. Do you want to get advice via this message? Reply yes to get advice. Another option is to schedule a visit with our office to address this message.   ? ?Please make an Appointment to return in 6 months, or sooner if needed ?

## 2021-09-27 ENCOUNTER — Other Ambulatory Visit (INDEPENDENT_AMBULATORY_CARE_PROVIDER_SITE_OTHER): Payer: Medicare Other

## 2021-09-27 DIAGNOSIS — E538 Deficiency of other specified B group vitamins: Secondary | ICD-10-CM

## 2021-09-27 DIAGNOSIS — R7303 Prediabetes: Secondary | ICD-10-CM | POA: Diagnosis not present

## 2021-09-27 DIAGNOSIS — E559 Vitamin D deficiency, unspecified: Secondary | ICD-10-CM | POA: Diagnosis not present

## 2021-09-27 DIAGNOSIS — N320 Bladder-neck obstruction: Secondary | ICD-10-CM | POA: Diagnosis not present

## 2021-09-27 DIAGNOSIS — E782 Mixed hyperlipidemia: Secondary | ICD-10-CM

## 2021-09-27 LAB — URINALYSIS, ROUTINE W REFLEX MICROSCOPIC
Bilirubin Urine: NEGATIVE
Hgb urine dipstick: NEGATIVE
Ketones, ur: NEGATIVE
Leukocytes,Ua: NEGATIVE
Nitrite: NEGATIVE
RBC / HPF: NONE SEEN (ref 0–?)
Specific Gravity, Urine: 1.025 (ref 1.000–1.030)
Urine Glucose: NEGATIVE
Urobilinogen, UA: 0.2 (ref 0.0–1.0)
pH: 6 (ref 5.0–8.0)

## 2021-09-27 LAB — CBC WITH DIFFERENTIAL/PLATELET
Basophils Absolute: 0 10*3/uL (ref 0.0–0.1)
Basophils Relative: 0.6 % (ref 0.0–3.0)
Eosinophils Absolute: 0.1 10*3/uL (ref 0.0–0.7)
Eosinophils Relative: 2.3 % (ref 0.0–5.0)
HCT: 39.3 % (ref 39.0–52.0)
Hemoglobin: 13.3 g/dL (ref 13.0–17.0)
Lymphocytes Relative: 24.8 % (ref 12.0–46.0)
Lymphs Abs: 1.3 10*3/uL (ref 0.7–4.0)
MCHC: 33.8 g/dL (ref 30.0–36.0)
MCV: 89.3 fl (ref 78.0–100.0)
Monocytes Absolute: 0.5 10*3/uL (ref 0.1–1.0)
Monocytes Relative: 9.9 % (ref 3.0–12.0)
Neutro Abs: 3.2 10*3/uL (ref 1.4–7.7)
Neutrophils Relative %: 62.4 % (ref 43.0–77.0)
Platelets: 236 10*3/uL (ref 150.0–400.0)
RBC: 4.41 Mil/uL (ref 4.22–5.81)
RDW: 14.3 % (ref 11.5–15.5)
WBC: 5.1 10*3/uL (ref 4.0–10.5)

## 2021-09-27 LAB — HEPATIC FUNCTION PANEL
ALT: 15 U/L (ref 0–53)
AST: 17 U/L (ref 0–37)
Albumin: 4.2 g/dL (ref 3.5–5.2)
Alkaline Phosphatase: 54 U/L (ref 39–117)
Bilirubin, Direct: 0.2 mg/dL (ref 0.0–0.3)
Total Bilirubin: 1 mg/dL (ref 0.2–1.2)
Total Protein: 6.2 g/dL (ref 6.0–8.3)

## 2021-09-27 LAB — BASIC METABOLIC PANEL
BUN: 16 mg/dL (ref 6–23)
CO2: 24 mEq/L (ref 19–32)
Calcium: 9.4 mg/dL (ref 8.4–10.5)
Chloride: 106 mEq/L (ref 96–112)
Creatinine, Ser: 0.88 mg/dL (ref 0.40–1.50)
GFR: 82.91 mL/min (ref >60.00)
Glucose, Bld: 124 mg/dL — ABNORMAL HIGH (ref 70–99)
Potassium: 3.7 mEq/L (ref 3.5–5.1)
Sodium: 140 mEq/L (ref 135–145)

## 2021-09-27 LAB — LIPID PANEL
Cholesterol: 135 mg/dL (ref 0–200)
HDL: 34.1 mg/dL — ABNORMAL LOW (ref >39.00)
NonHDL: 100.77
Total CHOL/HDL Ratio: 4
Triglycerides: 235 mg/dL — ABNORMAL HIGH (ref 0.0–149.0)
VLDL: 47 mg/dL — ABNORMAL HIGH (ref 0.0–40.0)

## 2021-09-27 LAB — PSA: PSA: 2.37 ng/mL (ref 0.10–4.00)

## 2021-09-27 LAB — VITAMIN D 25 HYDROXY (VIT D DEFICIENCY, FRACTURES): VITD: 33.8 ng/mL (ref 30.00–100.00)

## 2021-09-27 LAB — LDL CHOLESTEROL, DIRECT: Direct LDL: 75 mg/dL

## 2021-09-27 LAB — HEMOGLOBIN A1C: Hgb A1c MFr Bld: 6 % (ref 4.6–6.5)

## 2021-09-27 LAB — VITAMIN B12: Vitamin B-12: 477 pg/mL (ref 211–911)

## 2021-09-27 LAB — TSH: TSH: 3.73 u[IU]/mL (ref 0.35–5.50)

## 2021-09-30 ENCOUNTER — Encounter: Payer: Self-pay | Admitting: Internal Medicine

## 2021-09-30 DIAGNOSIS — M25552 Pain in left hip: Secondary | ICD-10-CM | POA: Insufficient documentation

## 2021-09-30 NOTE — Assessment & Plan Note (Signed)
BP Readings from Last 3 Encounters:  ?09/26/21 122/76  ?09/18/21 103/68  ?08/28/21 124/64  ? ?Stable, pt to continue medical treatment coreg, entresto ? ?

## 2021-09-30 NOTE — Assessment & Plan Note (Signed)
Lab Results  ?Component Value Date  ? HGBA1C 6.0 09/27/2021  ? ?Stable, pt to continue current medical treatment  - diet ? ?

## 2021-09-30 NOTE — Assessment & Plan Note (Signed)
?   Etiology, exam benign, ? Referred pain from knee, for xray per pt request,  to f/u any worsening symptoms or concerns ?

## 2021-09-30 NOTE — Assessment & Plan Note (Signed)
Lab Results  ?Component Value Date  ? LDLCALC 38 01/24/2019  ? ?Stable, pt to continue current statin lipitor 20 ? ?

## 2021-10-16 ENCOUNTER — Ambulatory Visit (INDEPENDENT_AMBULATORY_CARE_PROVIDER_SITE_OTHER): Payer: Medicare Other

## 2021-10-16 DIAGNOSIS — Z95810 Presence of automatic (implantable) cardiac defibrillator: Secondary | ICD-10-CM

## 2021-10-16 DIAGNOSIS — I5022 Chronic systolic (congestive) heart failure: Secondary | ICD-10-CM

## 2021-10-17 NOTE — Progress Notes (Signed)
EPIC Encounter for ICM Monitoring  Patient Name: [name] is a 76 y.o. male Date: 10/17/2021 Primary Care Physican: Biagio Borg, MD Primary Cardiologist: Burt Knack Electrophysiologist: Curt Bears Bi-V Pacing:   93.7%  Effective 92.4%      07/04/2021 Weight: 197 lbs  10/17/2021 Weight: 190 lbs   Time in AT/AF 0.3 hr/day (1.4%) Longest AT/AF 12 hours                                                            Spoke with patient and heart failure questions reviewed.  Pt asymptomatic for fluid accumulation.  He reports he may be drinking too much fluid and eating foods to high in salt.  Discussed limiting both salt and fluid intake.   Optivol thoracic impedance suggesting possible fluid accumulation since 5/1.   Fluid index greater than normal threshold starting 5/16.   Prescribed: Spironolactone 25 mg take 0.5 tablet daily.   Labs: 09/27/2021 Creatinine 0.88, BUN 16, Potassium 3.7, Sodium 140, GFR 82.91 09/18/2021 Creatinine 0.86, BUN 16, Potassium 4.3, Sodium 141  Recommendations:  Recommendation to limit salt intake to 2000 mg daily and fluid intake to 64 oz daily.  Encouraged to call if experiencing any fluid symptoms.    Follow-up plan: ICM clinic phone appointment on 10/29/2021 to recheck fluid levels.   91 day device clinic remote transmission 11/01/2021.       EP/Cardiology Office Visits:    10/22/2021 with Dr Burt Knack.     Copy of ICM check sent to Dr. Curt Bears and Dr Burt Knack as Juluis Rainier for 10/22/2021 OV.   3 month ICM trend: 10/16/2021.    12-14 Month ICM trend:     [name], RN 10/17/2021 1:22 PM

## 2021-10-22 ENCOUNTER — Encounter: Payer: Self-pay | Admitting: Cardiovascular Disease

## 2021-10-22 ENCOUNTER — Ambulatory Visit (INDEPENDENT_AMBULATORY_CARE_PROVIDER_SITE_OTHER): Payer: Medicare Other | Admitting: Cardiovascular Disease

## 2021-10-22 VITALS — BP 110/70 | HR 69 | Ht 67.0 in | Wt 189.6 lb

## 2021-10-22 DIAGNOSIS — Z01810 Encounter for preprocedural cardiovascular examination: Secondary | ICD-10-CM

## 2021-10-22 DIAGNOSIS — I480 Paroxysmal atrial fibrillation: Secondary | ICD-10-CM | POA: Diagnosis not present

## 2021-10-22 DIAGNOSIS — E782 Mixed hyperlipidemia: Secondary | ICD-10-CM | POA: Diagnosis not present

## 2021-10-22 DIAGNOSIS — I2510 Atherosclerotic heart disease of native coronary artery without angina pectoris: Secondary | ICD-10-CM | POA: Diagnosis not present

## 2021-10-22 DIAGNOSIS — I5022 Chronic systolic (congestive) heart failure: Secondary | ICD-10-CM

## 2021-10-22 NOTE — Progress Notes (Unsigned)
Cardiology Office Note:    Date:  10/22/2021   ID:  [name], DOB Oct 13, 1945, MRN [id number]  PCP:  Biagio Borg, MD   [place] Prov[facility] Cardiologist:  [name], MD Electrophysiologist:  Constance Haw, MD     Referring MD: Biagio Borg, MD   No chief complaint on file.   History of Present Illness:    [name] is a 76 y.o. male with a hx of chronic systolic heart failure with left bundle branch block, coronary artery disease with remote stenting of the RCA, and paroxysmal atrial fibrillation.  He presents for follow-up evaluation today.  He underwent CRT-D implantation in 2018.  He was ultimately found to have atrial fibrillation on remote device monitoring and was started on anticoagulation with apixaban.  His antiplatelet therapy was stopped at that time.  LV function normalized after cardiac resynchronization.  The patient is here alone today.  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  He is scheduled for endoscopy and also for left knee replacement next month.  He looks forward to trying to be more active after his knee replacement.  He denies any chest pain, chest pressure, orthopnea, PND, or leg swelling.  He has had no recent heart palpitations, lightheadedness, or syncope.  He denies any recent changes in his cardiac medicines.  He does complain of exertional dyspnea with 1 flight of stairs or with any moderate level activity.  He has had shortness of breath now for a few years but feels that his breathing is worse than it has been in the past.  He attributes this to a more sedentary lifestyle.  He has generalized fatigue and takes a nap at about 2 PM every day.  Past Medical History:  Diagnosis Date   AICD (automatic cardioverter/defibrillator) present    Allergic rhinitis    Allergy    Anginal pain ([place]) 05/26/14   chest pain after chasing dog   Atypical nevus of back 04/27/2003   moderate - mid lower  back   Basal cell carcinoma 01/26/2008   right cheek - MOHs   CAD (coronary artery disease)    hx of stent- 2005 RCA   Cataract    beginning stage both eyes   CHF (congestive heart failure) (HCC)    pacemaker Medtronic     Chronic back pain    intermittent   Constipation    Dizziness    Dysrhythmia    right bundle branch block    Esophageal stricture    GERD (gastroesophageal reflux disease)    Hemoptysis    Hiatal hernia    History of colonic polyps    hyperplastic   HTN (hypertension)    Hyperlipidemia    Hypertrophy of prostate with urinary obstruction and other lower urinary tract symptoms (LUTS)    OA (osteoarthritis)    OSA (obstructive sleep apnea)    cpap- 10    Other specified disorder of stomach and duodenum    duodenal periampulary tubulovillous adenoma removed by Dr. Ardis Hughs 5/10   Pericarditis 07/06/2019   Pneumonia    Pre-diabetes    no medications   Shortness of breath    with exertion   Sleep apnea    cpap   Testicular hypofunction     Past Surgical History:  Procedure Laterality Date   BILIARY STENT PLACEMENT N/A 03/19/2021   Procedure: BILIARY STENT PLACEMENT;  Surgeon: Irving Copas., MD;  Location:  [facility];  Service: Gastroenterology;  Laterality: N/A;   BIV ICD INSERTION CRT-D N/A 03/27/2017   Procedure: BIV ICD INSERTION CRT-D;  Surgeon: Constance Haw, MD;  Location: [facility];  Service: Cardiovascular;  Laterality: N/A;   CARDIAC CATHETERIZATION     '05, last 2009, showing patent RCA stent   COLONOSCOPY  12/2007   HYPERPLASTIC POLYP   COLONOSCOPY  12/2019   COLONOSCOPY WITH PROPOFOL N/A 06/02/2014   Procedure: COLONOSCOPY WITH PROPOFOL;  Surgeon: [name], MD;  [facility]: [facility];  Service: Endoscopy;  Laterality: N/A;   CORONARY ANGIOPLASTY  08/2003   ENDOSCOPIC MUCOSAL RESECTION  03/19/2021   Procedure: ENDOSCOPIC MUCOSAL RESECTION, ampullectomy;  Surgeon: Irving Copas., MD;  Location: [facility];  Service: Gastroenterology;;   ENDOSCOPIC RETROGRADE CHOLANGIOPANCREATOGRAPHY (ERCP) WITH PROPOFOL N/A 03/19/2021   Procedure: ENDOSCOPIC RETROGRADE CHOLANGIOPANCREATOGRAPHY (ERCP) WITH PROPOFOL;  Surgeon: Irving Copas., MD;  Location: [facility];  Service: Gastroenterology;  Laterality: N/A;   ESOPHAGOGASTRODUODENOSCOPY (EGD) WITH PROPOFOL N/A 06/02/2014   Procedure: ESOPHAGOGASTRODUODENOSCOPY (EGD) WITH PROPOFOL;  Surgeon: [name], MD;  [facility];  Service: Endoscopy;  Laterality: N/A;   ESOPHAGOGASTRODUODENOSCOPY (EGD) WITH PROPOFOL N/A 03/19/2021   Procedure: ESOPHAGOGASTRODUODENOSCOPY (EGD) WITH PROPOFOL;  Surgeon: Rush Landmark Telford Nab., MD;  [facility];  Service: Gastroenterology;  Laterality: N/A;   ESOPHAGOGASTRODUODENOSCOPY (EGD) WITH PROPOFOL N/A 03/23/2021   Procedure: ESOPHAGOGASTRODUODENOSCOPY (EGD) WITH PROPOFOL;  Surgeon: Ladene Artist, MD;  [facility];  Service: Endoscopy;  Laterality: N/A;   hernia surgery x 3     Bilateral Inguinal, Umbicial   INSERTION OF MESH N/A 03/20/2018   Procedure: INSERTION OF MESH;  Surgeon: Alphonsa Overall, MD;  Location: [place];  Service: General;  Laterality: N/A;   IRRIGATION AND DEBRIDEMENT ABSCESS Left 08/14/2012   Procedure: IRRIGATION AND DEBRIDEMENT LEFT INGUINAL BOIL ;  Surgeon: Ailene Rud, MD;  [facility];  Service: Urology;  Laterality: Left;   JOINT REPLACEMENT Right 2017   KNEE ARTHROPLASTY     LEFT HEART CATH AND CORONARY ANGIOGRAPHY N/A 01/24/2019   Procedure: LEFT HEART CATH AND CORONARY ANGIOGRAPHY;  Surgeon: Martinique, Peter M, MD;  Location: [facility];  Service: Cardiovascular;  Laterality: N/A;   LEFT HEART CATHETERIZATION WITH CORONARY ANGIOGRAM N/A 05/26/2014   Procedure: LEFT HEART CATHETERIZATION WITH CORONARY ANGIOGRAM;  Surgeon: Blane Ohara, MD;  Location: [facility];  Service: Cardiovascular;  Laterality: N/A;   LUMBAR  LAMINECTOMY/DECOMPRESSION MICRODISCECTOMY Left 09/26/2014   Procedure: Lumbar Laminectomy for resection of synovial cyst  Lumbar five- sacral one left;  Surgeon: Kary Kos, MD;  Location: [facility];  Service: Neurosurgery;  Laterality: Left;   MOLE REMOVAL Left 03/20/2018   Procedure: MOLE REMOVAL;  Surgeon: Alphonsa Overall, MD;  Location: [place];  Service: General;  Laterality: Left;   Oral surg to removed growth from ?sinus  10/2010   ?Dermoid removed by DrRiggs   PACEMAKER INSERTION  04/03/2017   PANCREATIC STENT PLACEMENT  03/19/2021   Procedure: PANCREATIC STENT PLACEMENT;  Surgeon: Irving Copas., MD;  [facility];  Service: Gastroenterology;;   POLYPECTOMY     RCA stenting     '05 RCA   right toe surgery  Right    Cyst    RIGHT/LEFT HEART CATH AND CORONARY ANGIOGRAPHY N/A 01/15/2017   Procedure: RIGHT/LEFT HEART CATH AND CORONARY ANGIOGRAPHY;  Surgeon: [name], MD;  Location: [facility];  Service: Cardiovascular;  Laterality: N/A;  s/p right knee arthroscopy  2005   SPHINCTEROTOMY  03/19/2021   Procedure: SPHINCTEROTOMY;  Surgeon: Mansouraty, Telford Nab., MD;  Location: [facility];  Service: Gastroenterology;;   [place] INJECTION  03/19/2021   Procedure: SUBMUCOSAL LIFTING INJECTION;  Surgeon: Irving Copas., MD;  Location: [facility];  Service: Gastroenterology;;   thumb surgery Right    TOTAL KNEE ARTHROPLASTY Right 03/12/2016   Procedure: RIGHT TOTAL KNEE ARTHROPLASTY;  Surgeon: Paralee Cancel, MD;  [facility];  Service: Orthopedics;  Laterality: Right;   UPPER ESOPHAGEAL ENDOSCOPIC ULTRASOUND (EUS) N/A 03/19/2021   Procedure: UPPER ESOPHAGEAL ENDOSCOPIC ULTRASOUND (EUS);  Surgeon: Irving Copas., MD;  Location: [facility];  Service: Gastroenterology;  Laterality: N/A;   UPPER GASTROINTESTINAL ENDOSCOPY     VENTRAL HERNIA REPAIR N/A 03/20/2018   Procedure: [place];   Surgeon: Alphonsa Overall, MD;  Location: [place];  Service: General;  Laterality: N/A;    Current Medications: Current Meds  Medication Sig   acetaminophen (TYLENOL) 650 MG CR tablet Take 650 mg by mouth in the morning and at bedtime.   atorvastatin (LIPITOR) 20 MG tablet TAKE 1 TABLET BY MOUTH DAILY   b complex vitamins tablet Take 1 tablet by mouth daily with lunch.   carvedilol (COREG) 25 MG tablet TAKE 1 TABLET BY MOUTH TWICE DAILY   cholecalciferol (VITAMIN D3) 25 MCG (1000 UNIT) tablet Take 1,000 Units by mouth daily.   Coenzyme Q10 (CO Q 10 PO) Take 200 mg by mouth daily with lunch.   diclofenac sodium (VOLTAREN) 1 % GEL Apply 1 application topically 4 (four) times daily as needed (pain).    ELIQUIS 5 MG TABS tablet TAKE 1 TABLET BY MOUTH TWICE DAILY   Flaxseed, Linseed, (FLAXSEED OIL PO) Take 1 capsule by mouth daily. With Omega 3   folic acid (FOLVITE) 725 MCG tablet Take 400 mcg by mouth daily.   Glucosamine HCl (GLUCOSAMINE PO) Take 1,500 mg by mouth every evening.   hydrocortisone 2.5 % cream Apply 1 application topically 2 (two) times daily as needed (rash on nose).    hydroxychloroquine (PLAQUENIL) 200 MG tablet TAKE 1 TABLET(200 MG) BY MOUTH DAILY (Patient taking differently: Take '200mg'$  by mouth Monday through Friday only.)   Lutein 20 MG TABS Take 20 mg by mouth daily.   Multiple Vitamin (MULTIVITAMIN) capsule Take 1 capsule by mouth daily.     nabumetone (RELAFEN) 500 MG tablet Take 500 mg by mouth daily.   nitroGLYCERIN (NITROSTAT) 0.4 MG SL tablet Place 1 tablet (0.4 mg total) under the tongue every 5 (five) minutes x 3 doses as needed for chest pain.   NON FORMULARY CPAP machine with sleep.   pantoprazole (PROTONIX) 40 MG tablet Take 1 tablet (40 mg total) by mouth 2 (two) times daily before a meal. TAKE 1 TABLET BY MOUTH EVERY DAY BEFORE BREAKFAST (Patient taking differently: Take 40 mg by mouth daily. TAKE 1 TABLET BY MOUTH EVERY DAY BEFORE BREAKFAST)   polyethylene glycol  (MIRALAX / GLYCOLAX) 17 g packet Take 17 g by mouth daily as needed for mild constipation.   Probiotic Product (PROBIOTIC DAILY PO) Take 1 capsule by mouth daily with lunch.    RESTASIS 0.05 % ophthalmic emulsion Place 1 drop into both eyes 2 (two) times daily as needed (dry eyes).   sacubitril-valsartan (ENTRESTO) 49-51 MG Take 1 tablet by mouth 2 (two) times daily.   Saw Palmetto, Serenoa repens, (SAW PALMETTO PO) Take 450 mg by mouth every  evening.   spironolactone (ALDACTONE) 25 MG tablet TAKE 1/2 TABLET(12.5 MG) BY MOUTH DAILY   sucralfate (CARAFATE) 1 g tablet Take 1 tablet (1 g total) by mouth 4 (four) times daily.   tamsulosin (FLOMAX) 0.4 MG CAPS capsule Take 0.4 mg by mouth daily.   Testosterone 20 % CREA Apply 2 mLs topically daily. Rub on shoulder   traMADol (ULTRAM) 50 MG tablet TAKE 1 TABLET(50 MG) BY MOUTH AT BEDTIME AS NEEDED   TURMERIC PO Take 1 tablet by mouth daily with lunch.    vitamin C (ASCORBIC ACID) 500 MG tablet Take 500 mg by mouth daily.   zinc gluconate 50 MG tablet Take 50 mg by mouth daily.     Allergies:   Sulfonamide derivatives   Social History   Socioeconomic History   Marital status: Married    Spouse name: Not on file   Number of children: 1   Years of education: Not on file   Highest education level: Not on file  Occupational History   Occupation: Charity fundraiser    Employer: [employer]  Tobacco Use   Smoking status: Former    Types: Cigarettes    Quit date: 05/20/1990    Years since quitting: 31.4    Passive exposure: Never   Smokeless tobacco: Never   Tobacco comments:    previous 30 pack year history  Vaping Use   Vaping Use: Never used  Substance and Sexual Activity   Alcohol use: Yes    Comment: rare   Drug use: Never   Sexual activity: Not on file  Other Topics Concern   Not on file  Social History Narrative   Not on file   Social Determinants of Health   Financial Resource Strain: Not on file  Food Insecurity:  Not on file  Transportation Needs: Not on file  Physical Activity: Not on file  Stress: Not on file  Social Connections: Not on file     Family History: The patient's family history includes Arthritis in his brother and daughter; COPD in his brother; Cancer in his brother; Coronary artery disease in his father; Diabetes in his father; Heart disease in his father and mother; Heart failure in his brother; Hypertension in an other family member; Stomach cancer in his paternal grandmother; Sudden death in his father. There is no history of Colon cancer, Esophageal cancer, Colon polyps, Ulcerative colitis, Liver disease, Pancreatic cancer, Inflammatory bowel disease, or Rectal cancer.  ROS:   Please see the history of present illness.    All other systems reviewed and are negative.  EKGs/Labs/Other Studies Reviewed:    The following studies were reviewed today: Echo 06/22/20:  1. Left ventricular ejection fraction, by estimation, is 50 to 55%. The  left ventricle has low normal function. The left ventricle has no regional  wall motion abnormalities. There is moderate concentric left ventricular  hypertrophy. Left ventricular  diastolic parameters are indeterminate.   2. Right ventricular systolic function is normal. The right ventricular  size is normal. There is normal pulmonary artery systolic pressure.   3. Left atrial size was mildly dilated.   4. The mitral valve is normal in structure. Mild mitral valve  regurgitation. No evidence of mitral stenosis.   5. The aortic valve is tricuspid. There is mild calcification of the  aortic valve. Aortic valve regurgitation is not visualized. No aortic  stenosis is present.   Comparison(s): No significant change from prior study.   EKG:  EKG is ordered today.  The ekg ordered today demonstrates electronic ventricular pacemaker 69 bpm  Recent Labs: 03/24/2021: Magnesium 2.4 09/27/2021: ALT 15; BUN 16; Creatinine, Ser 0.88; Hemoglobin 13.3;  Platelets 236.0; Potassium 3.7; Sodium 140; TSH 3.73  Recent Lipid Panel    Component Value Date/Time   CHOL 135 09/27/2021 0815   CHOL 138 11/27/2016 0914   TRIG 235.0 (H) 09/27/2021 0815   HDL 34.10 (L) 09/27/2021 0815   HDL 35 (L) 11/27/2016 0914   CHOLHDL 4 09/27/2021 0815   VLDL 47.0 (H) 09/27/2021 0815   LDLCALC 38 01/24/2019 0725   LDLCALC 65 11/27/2016 0914   LDLDIRECT 75.0 09/27/2021 0815     Risk Assessment/Calculations:    CHA2DS2-VASc Score = 5  {Confirm score is correct.  If not, click here to update score.  REFRESH note.  :1} This indicates a 7.2% annual risk of stroke. The patient's score is based upon: CHF History: 1 HTN History: 1 Diabetes History: 0 Stroke History: 0 Vascular Disease History: 1 Age Score: 2 Gender Score: 0   {This patient has a significant risk of stroke if diagnosed with atrial fibrillation.  Please consider VKA or DOAC agent for anticoagulation if the bleeding risk is acceptable.   You can also use the SmartPhrase .[facility] for documentation.   :[id number]}       Physical Exam:    VS:  BP 110/70   Pulse 69   Ht '5\' 7"'$  (1.702 m)   Wt 189 lb 9.6 oz (86 kg)   SpO2 95%   BMI 29.70 kg/m     Wt Readings from Last 3 Encounters:  10/22/21 189 lb 9.6 oz (86 kg)  09/26/21 191 lb (86.6 kg)  09/18/21 189 lb (85.7 kg)     GEN:  Well nourished, well developed in no acute distress HEENT: Normal NECK: No JVD; No carotid bruits LYMPHATICS: No lymphadenopathy CARDIAC: RRR, no murmurs, rubs, gallops RESPIRATORY:  Clear to auscultation without rales, wheezing or rhonchi  ABDOMEN: Soft, non-tender, non-distended MUSCULOSKELETAL:  No edema; No deformity  SKIN: Warm and dry NEUROLOGIC:  Alert and oriented x 3 PSYCHIATRIC:  Normal affect   ASSESSMENT:    1. AF (paroxysmal atrial fibrillation) ([place])   2. Coronary artery disease involving native coronary artery of native heart without angina pectoris   3. Mixed hyperlipidemia   4.  Chronic systolic (congestive) heart failure (HCC)   5. Preop cardiovascular exam    PLAN:    In order of problems listed above:  The patient is tolerating apixaban without bleeding problems.  He has an upcoming endoscopic procedure and is at reasonable risk of holding apixaban for the procedure as indicated.  He has no history of stroke or TIA. Doing well on current medical therapy.  No antiplatelet therapy in the setting of chronic oral anticoagulation.  He is treated with a beta-blocker and statin drug. Treated with atorvastatin.  LDL cholesterol is 75 mg/dL.  Hypertriglyceridemia noted with a triglyceride level of 235.  Lifestyle modification reviewed at length with the patient. Most recent echo from February reviewed with LVEF at 50 to 55%.  He continues on Entresto, spironolactone, and carvedilol at goal doses.  RV function is normal.  No significant valvular disease.  Continue current treatments. The patient has exertional dyspnea which I think is related to his lack of activity/deconditioning.  His weight has been up for the past few years and he will work on weight loss once he undergoes knee surgery and is able to walk better.  He is  not having any angina and his heart rhythm is stable.  His cardiac risk factors appear to be well controlled.  He has a low risk of cardiac complication and can proceed with knee replacement surgery in the future as scheduled without further cardiac testing.           Medication Adjustments/Labs and Tests Ordered: Current medicines are reviewed at length with the patient today.  Concerns regarding medicines are outlined above.  Orders Placed This Encounter  Procedures   EKG 12-Lead   No orders of the defined types were placed in this encounter.   Patient Instructions  Medication Instructions:  Your physician recommends that you continue on your current medications as directed. Please refer to the Current Medication list given to you today.  *If  you need a refill on your cardiac medications before your next appointment, please call your pharmacy*   Lab Work: NONE If you have labs (blood work) drawn today and your tests are completely normal, you will receive your results only by: [place] (if you have MyChart) OR A paper copy in the mail If you have any lab test that is abnormal or we need to change your treatment, we will call you to review the results.   Testing/Procedures: NONE   Follow-Up: At [facility], you and your health needs are our priority.  As part of our continuing mission to provide you with exceptional heart care, we have created designated Provider Care Teams.  These Care Teams include your primary Cardiologist (physician) and Advanced Practice Providers (APPs -  Physician Assistants and Nurse Practitioners) who all work together to provide you with the care you need, when you need it.  Your next appointment:   6 month(s)  The format for your next appointment:   In Person  Provider:   [name], MD      Important Information About Sugar         Signed, [name], MD  10/22/2021 9:15 AM    [place]

## 2021-10-22 NOTE — Patient Instructions (Signed)
Medication Instructions:  Your physician recommends that you continue on your current medications as directed. Please refer to the Current Medication list given to you today.  *If you need a refill on your cardiac medications before your next appointment, please call your pharmacy*   Lab Work: NONE If you have labs (blood work) drawn today and your tests are completely normal, you will receive your results only by: MyChart Message (if you have MyChart) OR A paper copy in the mail If you have any lab test that is abnormal or we need to change your treatment, we will call you to review the results.   Testing/Procedures: NONE   Follow-Up: At [facility], you and your health needs are our priority.  As part of our continuing mission to provide you with exceptional heart care, we have created designated Provider Care Teams.  These Care Teams include your primary Cardiologist (physician) and Advanced Practice Providers (APPs -  Physician Assistants and Nurse Practitioners) who all work together to provide you with the care you need, when you need it.  Your next appointment:   6 month(s)  The format for your next appointment:   In Person  Provider:   Michael Cooper, MD      Important Information About Sugar       

## 2021-10-24 DIAGNOSIS — M9903 Segmental and somatic dysfunction of lumbar region: Secondary | ICD-10-CM | POA: Diagnosis not present

## 2021-10-24 DIAGNOSIS — M5451 Vertebrogenic low back pain: Secondary | ICD-10-CM | POA: Diagnosis not present

## 2021-10-24 DIAGNOSIS — R293 Abnormal posture: Secondary | ICD-10-CM | POA: Diagnosis not present

## 2021-10-24 DIAGNOSIS — M9905 Segmental and somatic dysfunction of pelvic region: Secondary | ICD-10-CM | POA: Diagnosis not present

## 2021-10-29 ENCOUNTER — Ambulatory Visit (INDEPENDENT_AMBULATORY_CARE_PROVIDER_SITE_OTHER): Payer: Medicare Other

## 2021-10-29 DIAGNOSIS — I5022 Chronic systolic (congestive) heart failure: Secondary | ICD-10-CM

## 2021-10-29 DIAGNOSIS — Z95810 Presence of automatic (implantable) cardiac defibrillator: Secondary | ICD-10-CM

## 2021-10-30 ENCOUNTER — Other Ambulatory Visit (HOSPITAL_COMMUNITY): Payer: Medicare Other

## 2021-10-30 ENCOUNTER — Telehealth: Payer: Self-pay

## 2021-10-30 NOTE — Telephone Encounter (Signed)
Remote ICM transmission received.  Attempted call to patient regarding ICM remote transmission and left detailed message per DPR.  Advised to return call for any fluid symptoms or questions. Next ICM remote transmission scheduled 11/21/2021.

## 2021-10-30 NOTE — Progress Notes (Signed)
EPIC Encounter for ICM Monitoring  Patient Name: [name] is a 76 y.o. male Date: 10/30/2021 Primary Care Physican: Biagio Borg, MD Primary Cardiologist: Burt Knack Electrophysiologist: Curt Bears Bi-V Pacing:   94.7%  Effective 94%      07/04/2021 Weight: 197 lbs  10/17/2021 Weight: 190 lbs   Time in AT/AF  0.3 hr/day (1.4%) Longest AT/AF 12 hours                                                            Attempted call to patient and unable to reach.  Left detailed message per DPR regarding transmission. Transmission reviewed.    Optivol thoracic impedance suggesting possible fluid accumulation continues since 5/1.   Fluid index greater than normal threshold starting 5/16.   Prescribed: Spironolactone 25 mg take 0.5 tablet daily.   Labs: 09/27/2021 Creatinine 0.88, BUN 16, Potassium 3.7, Sodium 140, GFR 82.91 09/18/2021 Creatinine 0.86, BUN 16, Potassium 4.3, Sodium 141   Recommendations:  Left voice mail with ICM number and encouraged to call if experiencing any fluid symptoms.   Follow-up plan: ICM clinic phone appointment on 11/21/2021.   91 day device clinic remote transmission 01/31/2022.       EP/Cardiology Office Visits:    05/01/2022 with Dr Burt Knack.     Copy of ICM check sent to Dr. Curt Bears and will send to Dr Burt Knack as Juluis Rainier if patient is reached.   3 month ICM trend: 10/29/2021.    12-14 Month ICM trend:     [name], RN 10/30/2021 12:15 PM

## 2021-10-31 ENCOUNTER — Other Ambulatory Visit: Payer: Self-pay | Admitting: Rheumatology

## 2021-10-31 ENCOUNTER — Telehealth: Payer: Self-pay | Admitting: Cardiovascular Disease

## 2021-10-31 NOTE — Telephone Encounter (Signed)
Pt called in stated it was personal and would like to speak to Dr. Burt Knack only and would not provide any additional information. Also stated to also give him code word, "[place]".

## 2021-11-01 ENCOUNTER — Ambulatory Visit (INDEPENDENT_AMBULATORY_CARE_PROVIDER_SITE_OTHER): Payer: Medicare Other

## 2021-11-01 DIAGNOSIS — I428 Other cardiomyopathies: Secondary | ICD-10-CM | POA: Diagnosis not present

## 2021-11-01 LAB — CUP PACEART REMOTE DEVICE CHECK
Battery Remaining Longevity: 22 mo
Battery Voltage: 2.93 V
Brady Statistic AP VP Percent: 1.89 %
Brady Statistic AP VS Percent: 0.03 %
Brady Statistic AS VP Percent: 89.77 %
Brady Statistic AS VS Percent: 8.31 %
Brady Statistic RA Percent Paced: 1.9 %
Brady Statistic RV Percent Paced: 43.48 %
Date Time Interrogation Session: 20230615043825
HighPow Impedance: 68 Ohm
Implantable Lead Implant Date: 20181108
Implantable Lead Implant Date: 20181108
Implantable Lead Implant Date: 20181108
Implantable Lead Location: 753858
Implantable Lead Location: 753859
Implantable Lead Location: 753860
Implantable Lead Model: 4298
Implantable Lead Model: 5076
Implantable Pulse Generator Implant Date: 20181108
Lead Channel Impedance Value: 1064 Ohm
Lead Channel Impedance Value: 1083 Ohm
Lead Channel Impedance Value: 1140 Ohm
Lead Channel Impedance Value: 189.525
Lead Channel Impedance Value: 201.488
Lead Channel Impedance Value: 250.371
Lead Channel Impedance Value: 268.078
Lead Channel Impedance Value: 292.657
Lead Channel Impedance Value: 361 Ohm
Lead Channel Impedance Value: 399 Ohm
Lead Channel Impedance Value: 399 Ohm
Lead Channel Impedance Value: 456 Ohm
Lead Channel Impedance Value: 551 Ohm
Lead Channel Impedance Value: 589 Ohm
Lead Channel Impedance Value: 646 Ohm
Lead Channel Impedance Value: 646 Ohm
Lead Channel Impedance Value: 703 Ohm
Lead Channel Impedance Value: 817 Ohm
Lead Channel Pacing Threshold Amplitude: 0.5 V
Lead Channel Pacing Threshold Amplitude: 0.625 V
Lead Channel Pacing Threshold Amplitude: 2.75 V
Lead Channel Pacing Threshold Pulse Width: 0.4 ms
Lead Channel Pacing Threshold Pulse Width: 0.4 ms
Lead Channel Pacing Threshold Pulse Width: 1 ms
Lead Channel Sensing Intrinsic Amplitude: 2.5 mV
Lead Channel Sensing Intrinsic Amplitude: 2.5 mV
Lead Channel Sensing Intrinsic Amplitude: 9.375 mV
Lead Channel Sensing Intrinsic Amplitude: 9.375 mV
Lead Channel Setting Pacing Amplitude: 2 V
Lead Channel Setting Pacing Amplitude: 2.5 V
Lead Channel Setting Pacing Amplitude: 3 V
Lead Channel Setting Pacing Pulse Width: 0.4 ms
Lead Channel Setting Pacing Pulse Width: 1 ms
Lead Channel Setting Sensing Sensitivity: 0.3 mV

## 2021-11-01 NOTE — Telephone Encounter (Signed)
Next Visit: 02/19/2022   Last Visit: 09/18/2021   UDS:09/18/2021, UDS is c/w  Tramadol use.   Narc Agreement: 04/19/2021   Last Fill: 09/21/2021, Tramadol '50mg'$  by mouth at bedtime as needed.   Okay to refill Tramadol?

## 2021-11-07 ENCOUNTER — Encounter: Payer: Self-pay | Admitting: Cardiovascular Disease

## 2021-11-07 DIAGNOSIS — M9903 Segmental and somatic dysfunction of lumbar region: Secondary | ICD-10-CM | POA: Diagnosis not present

## 2021-11-07 DIAGNOSIS — M9905 Segmental and somatic dysfunction of pelvic region: Secondary | ICD-10-CM | POA: Diagnosis not present

## 2021-11-12 NOTE — H&P (Signed)
TOTAL KNEE ADMISSION H&P  Patient is being admitted for left total knee arthroplasty.  Subjective:  Chief Complaint: Left knee pain.  HPI: [name], 76 y.o. male has a history of pain and functional disability in the left knee due to arthritis and has failed non-surgical conservative treatments for greater than 12 weeks to include corticosteriod injections and activity modification. Onset of symptoms was gradual, starting  several  years ago with gradually worsening course since that time. The patient noted no past surgery on the left knee.  Patient currently rates pain in the left knee at 8 out of 10 with activity. Patient has night pain, worsening of pain with activity and weight bearing, pain with passive range of motion, and crepitus. Patient has evidence of  bone-on-bone arthritis in the medial and patellofemoral compartments of the left knee  by imaging studies. There is no active infection.  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   Abnormal findings on esophagogastroduodenoscopy (EGD) 02/03/2021   Polyp of duodenum 02/03/2021   Axillary abscess 06/26/2020   Chronic diarrhea 06/26/2020   Fever 01/18/2020   Pericarditis 07/06/2019   Right hip pain 07/06/2019   Acute chest pain 01/24/2019   Prediabetes 12/24/2018   Rheumatoid arthritis (HCC) 12/24/2018   Preventative health care 06/25/2018   Incarcerated ventral hernia 03/20/2018   DCM (dilated cardiomyopathy) (HCC) 02/11/2017   Chronic systolic heart failure (HCC) 01/15/2017   History of pneumonia 10/23/2016   Osteoarthritis 10/23/2016   Primary osteoarthritis of both feet 10/04/2016    Primary osteoarthritis of left knee 10/04/2016   DDD (degenerative disc disease), lumbar 10/04/2016   Hemoptysis 05/27/2016   Overweight (BMI 25.0-29.9) 03/14/2016   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   Parotid mass 08/23/2015   Dyspnea 11/29/2014   Synovial cyst of lumbar facet joint 09/26/2014   Incisional hernia, without obstruction or gangrene 12/06/2013   Nausea alone 08/27/2013   Colon cancer screening 08/27/2013   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   Benign prostatic hyperplasia with urinary obstruction 06/18/2010   Other specified disorder of stomach and duodenum 12/16/2008   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 11/15/2008   Gastroesophageal reflux disease 09/02/2008   COLONIC POLYPS, HYPERPLASTIC 05/24/2007   OSA on CPAP 05/24/2007   ALLERGIC RHINITIS 05/24/2007   ESOPHAGEAL STRICTURE 05/24/2007   HIATAL HERNIA 05/24/2007   Hyperlipidemia 02/06/2007   Essential hypertension 02/06/2007   Coronary artery disease involving native coronary artery of native heart without angina pectoris 02/06/2007   Primary osteoarthritis of both hands 02/06/2007   Neck pain on right side 02/06/2007    Past Medical History:  Diagnosis Date   AICD (automatic cardioverter/defibrillator) present    Allergic rhinitis    Allergy    Anginal pain (HCC) 05/26/14   chest pain after chasing dog   Atypical nevus of back 04/27/2003   moderate - mid lower back   Basal cell carcinoma 01/26/2008   right cheek - MOHs   CAD (coronary artery disease)    hx of stent- 2005 RCA   Cataract    beginning stage both eyes   CHF (congestive heart failure) [facility])    pacemaker Med[employer]  Chronic back pain    intermittent   Constipation    Dizziness    Dysrhythmia    right bundle branch block    Esophageal stricture    GERD (gastroesophageal reflux disease)    Hemoptysis    Hiatal hernia    History of colonic polyps    hyperplastic   HTN (hypertension)    Hyperlipidemia    Hypertrophy of prostate with  urinary obstruction and other lower urinary tract symptoms (LUTS)    OA (osteoarthritis)    OSA (obstructive sleep apnea)    cpap- 10    Other specified disorder of stomach and duodenum    duodenal periampulary tubulovillous adenoma removed by Dr. Christella Hartigan 5/10   Pericarditis 07/06/2019   Pneumonia    Pre-diabetes    no medications   Shortness of breath    with exertion   Sleep apnea    cpap   Testicular hypofunction     Past Surgical History:  Procedure Laterality Date   BILIARY STENT PLACEMENT N/A 03/19/2021   Procedure: BILIARY STENT PLACEMENT;  Surgeon: Lemar Lofty., MD;  Location: [facility];  Service: Gastroenterology;  Laterality: N/A;   BIV ICD INSERTION CRT-D N/A 03/27/2017   Procedure: BIV ICD INSERTION CRT-D;  Surgeon: Regan Lemming, MD;  Location: [facility] INVASIVE CV LAB;  Service: Cardiovascular;  Laterality: N/A;   CARDIAC CATHETERIZATION     '05, last 2009, showing patent RCA stent   COLONOSCOPY  12/2007   HYPERPLASTIC POLYP   COLONOSCOPY  12/2019   COLONOSCOPY WITH PROPOFOL N/A 06/02/2014   Procedure: COLONOSCOPY WITH PROPOFOL;  Surgeon: Rachael Fee, MD;  Location: [facility];  Service: Endoscopy;  Laterality: N/A;   CORONARY ANGIOPLASTY  08/2003   ENDOSCOPIC MUCOSAL RESECTION  03/19/2021   Procedure: ENDOSCOPIC MUCOSAL RESECTION, ampullectomy;  Surgeon: Lemar Lofty., MD;  Location: [facility];  Service: Gastroenterology;;   ENDOSCOPIC RETROGRADE CHOLANGIOPANCREATOGRAPHY (ERCP) WITH PROPOFOL N/A 03/19/2021   Procedure: ENDOSCOPIC RETROGRADE CHOLANGIOPANCREATOGRAPHY (ERCP) WITH PROPOFOL;  Surgeon: Lemar Lofty., MD;  Location: [facility];  Service: Gastroenterology;  Laterality: N/A;   ESOPHAGOGASTRODUODENOSCOPY (EGD) WITH PROPOFOL N/A 06/02/2014   Procedure: ESOPHAGOGASTRODUODENOSCOPY (EGD) WITH PROPOFOL;  Surgeon: Rachael Fee, MD;  Location: [facility];  Service: Endoscopy;  Laterality: N/A;   ESOPHAGOGASTRODUODENOSCOPY  (EGD) WITH PROPOFOL N/A 03/19/2021   Procedure: ESOPHAGOGASTRODUODENOSCOPY (EGD) WITH PROPOFOL;  Surgeon: Meridee Score Netty Starring., MD;  Location: [facility];  Service: Gastroenterology;  Laterality: N/A;   ESOPHAGOGASTRODUODENOSCOPY (EGD) WITH PROPOFOL N/A 03/23/2021   Procedure: ESOPHAGOGASTRODUODENOSCOPY (EGD) WITH PROPOFOL;  Surgeon: Meryl Dare, MD;  Location: [facility];  Service: Endoscopy;  Laterality: N/A;   hernia surgery x 3     Bilateral Inguinal, Umbicial   INSERTION OF MESH N/A 03/20/2018   Procedure: INSERTION OF MESH;  Surgeon: Ovidio Kin, MD;  Location: [facility] OR;  Service: General;  Laterality: N/A;   IRRIGATION AND DEBRIDEMENT ABSCESS Left 08/14/2012   Procedure: IRRIGATION AND DEBRIDEMENT LEFT INGUINAL BOIL ;  Surgeon: Kathi Ludwig, MD;  Location: [facility];  Service: Urology;  Laterality: Left;   JOINT REPLACEMENT Right 2017   KNEE ARTHROPLASTY     LEFT HEART CATH AND CORONARY ANGIOGRAPHY N/A 01/24/2019   Procedure: LEFT HEART CATH AND CORONARY ANGIOGRAPHY;  Surgeon: Swaziland, Peter M, MD;  Location: [facility] INVASIVE CV LAB;  Service: Cardiovascular;  Laterality: N/A;   LEFT HEART CATHETERIZATION WITH CORONARY ANGIOGRAM N/A 05/26/2014   Procedure: LEFT HEART CATHETERIZATION WITH CORONARY ANGIOGRAM;  Surgeon: Micheline Chapman,  MD;  Location: [facility] CATH LAB;  Service: Cardiovascular;  Laterality: N/A;   LUMBAR LAMINECTOMY/DECOMPRESSION MICRODISCECTOMY Left 09/26/2014   Procedure: Lumbar Laminectomy for resection of synovial cyst  Lumbar five- sacral one left;  Surgeon: Donalee Citrin, MD;  Location: [facility] NEURO ORS;  Service: Neurosurgery;  Laterality: Left;   MOLE REMOVAL Left 03/20/2018   Procedure: MOLE REMOVAL;  Surgeon: Ovidio Kin, MD;  Location: [facility] OR;  Service: General;  Laterality: Left;   Oral surg to removed growth from ?sinus  10/2010   ?Dermoid removed by DrRiggs   PACEMAKER INSERTION  04/03/2017   PANCREATIC STENT PLACEMENT  03/19/2021   Procedure: PANCREATIC STENT  PLACEMENT;  Surgeon: Lemar Lofty., MD;  Location: [facility];  Service: Gastroenterology;;   POLYPECTOMY     RCA stenting     '05 RCA   right toe surgery  Right    Cyst    RIGHT/LEFT HEART CATH AND CORONARY ANGIOGRAPHY N/A 01/15/2017   Procedure: RIGHT/LEFT HEART CATH AND CORONARY ANGIOGRAPHY;  Surgeon: Tonny Bollman, MD;  Location: [facility] INVASIVE CV LAB;  Service: Cardiovascular;  Laterality: N/A;   s/p right knee arthroscopy  2005   SPHINCTEROTOMY  03/19/2021   Procedure: SPHINCTEROTOMY;  Surgeon: Mansouraty, Netty Starring., MD;  Location: [facility];  Service: Gastroenterology;;   SUBMUCOSAL LIFTING INJECTION  03/19/2021   Procedure: SUBMUCOSAL LIFTING INJECTION;  Surgeon: Lemar Lofty., MD;  Location: [facility];  Service: Gastroenterology;;   thumb surgery Right    TOTAL KNEE ARTHROPLASTY Right 03/12/2016   Procedure: RIGHT TOTAL KNEE ARTHROPLASTY;  Surgeon: Durene Romans, MD;  Location: [facility];  Service: Orthopedics;  Laterality: Right;   UPPER ESOPHAGEAL ENDOSCOPIC ULTRASOUND (EUS) N/A 03/19/2021   Procedure: UPPER ESOPHAGEAL ENDOSCOPIC ULTRASOUND (EUS);  Surgeon: Lemar Lofty., MD;  Location: [facility];  Service: Gastroenterology;  Laterality: N/A;   UPPER GASTROINTESTINAL ENDOSCOPY     VENTRAL HERNIA REPAIR N/A 03/20/2018   Procedure: LAPAROSCOPIC VENTRAL INCISIONAL  HERNIA ERAS PATHWAY;  Surgeon: Ovidio Kin, MD;  Location: [facility] OR;  Service: General;  Laterality: N/A;    Prior to Admission medications   Medication Sig Start Date End Date Taking? Authorizing Provider  acetaminophen (TYLENOL) 650 MG CR tablet Take 650 mg by mouth in the morning and at bedtime.    [provider]  atorvastatin (LIPITOR) 20 MG tablet TAKE 1 TABLET BY MOUTH DAILY 05/31/21   Tonny Bollman, MD  b complex vitamins tablet Take 1 tablet by mouth daily with lunch.    [provider]  carvedilol (COREG) 25 MG tablet TAKE 1 TABLET BY MOUTH TWICE DAILY  08/20/21   Tonny Bollman, MD  cholecalciferol (VITAMIN D3) 25 MCG (1000 UNIT) tablet Take 1,000 Units by mouth daily.    [provider]  Coenzyme Q10 (CO Q 10 PO) Take 200 mg by mouth daily with lunch.    [provider]  diclofenac sodium (VOLTAREN) 1 % GEL Apply 1 application topically 4 (four) times daily as needed (pain).  10/02/16   [provider]  ELIQUIS 5 MG TABS tablet TAKE 1 TABLET BY MOUTH TWICE DAILY 08/14/21   Camnitz, Will Daphine Deutscher, MD  Flaxseed, Linseed, (FLAXSEED OIL PO) Take 1 capsule by mouth daily. With Omega 3    [provider]  folic acid (FOLVITE) 400 MCG tablet Take 400 mcg by mouth daily.    [provider]  Glucosamine HCl (GLUCOSAMINE PO) Take 1,500 mg by mouth every evening.    [provider]  hydrocortisone 2.5 %  cream Apply 1 application topically 2 (two) times daily as needed (rash on nose).  01/14/19   [provider]  hydroxychloroquine (PLAQUENIL) 200 MG tablet TAKE 1 TABLET(200 MG) BY MOUTH DAILY Patient taking differently: Take 200mg  by mouth Monday through Friday only. 09/05/21   Gearldine Bienenstock, PA-C  Lutein 20 MG TABS Take 20 mg by mouth daily.    [provider]  Multiple Vitamin (MULTIVITAMIN) capsule Take 1 capsule by mouth daily.      [provider]  nabumetone (RELAFEN) 500 MG tablet Take 500 mg by mouth daily.    [provider]  nitroGLYCERIN (NITROSTAT) 0.4 MG SL tablet Place 1 tablet (0.4 mg total) under the tongue every 5 (five) minutes x 3 doses as needed for chest pain. 01/25/19   Filbert Schilder, NP  NON FORMULARY CPAP machine with sleep.    [provider]  pantoprazole (PROTONIX) 40 MG tablet Take 1 tablet (40 mg total) by mouth 2 (two) times daily before a meal. TAKE 1 TABLET BY MOUTH EVERY DAY BEFORE BREAKFAST Patient taking differently: Take 40 mg by mouth daily. TAKE 1 TABLET BY MOUTH EVERY DAY BEFORE BREAKFAST 03/19/21   Mansouraty, Netty Starring., MD   polyethylene glycol (MIRALAX / GLYCOLAX) 17 g packet Take 17 g by mouth daily as needed for mild constipation. 03/24/21   Alwyn Ren, MD  Probiotic Product (PROBIOTIC DAILY PO) Take 1 capsule by mouth daily with lunch.     [provider]  RESTASIS 0.05 % ophthalmic emulsion Place 1 drop into both eyes 2 (two) times daily as needed (dry eyes). 01/12/21   [provider]  sacubitril-valsartan (ENTRESTO) 49-51 MG Take 1 tablet by mouth 2 (two) times daily. 09/13/21   Sheilah Pigeon, PA-C  Saw Palmetto, Serenoa repens, (SAW PALMETTO PO) Take 450 mg by mouth every evening.    [provider]  spironolactone (ALDACTONE) 25 MG tablet TAKE 1/2 TABLET(12.5 MG) BY MOUTH DAILY 08/14/21   Tonny Bollman, MD  sucralfate (CARAFATE) 1 g tablet Take 1 tablet (1 g total) by mouth 4 (four) times daily. 03/24/21   Alwyn Ren, MD  tamsulosin (FLOMAX) 0.4 MG CAPS capsule Take 0.4 mg by mouth daily.    [provider]  Testosterone 20 % CREA Apply 2 mLs topically daily. Rub on shoulder    [provider]  traMADol (ULTRAM) 50 MG tablet TAKE 1 TABLET(50 MG) BY MOUTH AT BEDTIME AS NEEDED 11/01/21   Pollyann Savoy, MD  TURMERIC PO Take 1 tablet by mouth daily with lunch.     [provider]  vitamin C (ASCORBIC ACID) 500 MG tablet Take 500 mg by mouth daily.    [provider]  zinc gluconate 50 MG tablet Take 50 mg by mouth daily.    [provider]    Allergies  Allergen Reactions   Sulfonamide Derivatives Rash         Social History   Socioeconomic History   Marital status: Married    Spouse name: Not on file   Number of children: 1   Years of education: Not on file   Highest education level: Not on file  Occupational History   Occupation: Nutritional therapist    Employer: [employer]  Tobacco Use   Smoking status: Former    Types: Cigarettes    Quit date: 05/20/1990    Years since quitting: 31.5     Passive exposure: Never   Smokeless tobacco: Never  Tobacco comments:    previous 30 pack year history  Vaping Use   Vaping Use: Never used  Substance and Sexual Activity   Alcohol use: Yes    Comment: rare   Drug use: Never   Sexual activity: Not on file  Other Topics Concern   Not on file  Social History Narrative   Not on file   Social Determinants of Health   Financial Resource Strain: Not on file  Food Insecurity: Not on file  Transportation Needs: Not on file  Physical Activity: Not on file  Stress: Not on file  Social Connections: Not on file  Intimate Partner Violence: Not on file    Tobacco Use: Medium Risk (10/22/2021)   Patient History    Smoking Tobacco Use: Former    Smokeless Tobacco Use: Never    Passive Exposure: Never   Social History   Substance and Sexual Activity  Alcohol Use Yes   Comment: rare    Family History  Problem Relation Age of Onset   Heart disease Mother    Coronary artery disease Father    Diabetes Father    Sudden death Father        due to heart disease   Heart disease Father    COPD Brother    Arthritis Brother    Heart failure Brother    Cancer Brother        lung    Stomach cancer Paternal Grandmother    Arthritis Daughter    Hypertension Other    Colon cancer Neg Hx    Esophageal cancer Neg Hx    Colon polyps Neg Hx    Ulcerative colitis Neg Hx    Liver disease Neg Hx    Pancreatic cancer Neg Hx    Inflammatory bowel disease Neg Hx    Rectal cancer Neg Hx     Review of Systems  Constitutional:  Negative for chills and fever.  HENT:  Negative for congestion, sore throat and tinnitus.   Eyes:  Negative for double vision, photophobia and pain.  Respiratory:  Negative for cough, shortness of breath and wheezing.   Cardiovascular:  Negative for chest pain, palpitations and orthopnea.  Gastrointestinal:  Negative for heartburn, nausea and vomiting.  Genitourinary:  Negative for dysuria, frequency and urgency.   Musculoskeletal:  Positive for joint pain.  Neurological:  Negative for dizziness, weakness and headaches.    Objective:  Physical Exam: Well nourished and well developed.  General: Alert and oriented x3, cooperative and pleasant, no acute distress.  Head: normocephalic, atraumatic, neck supple.  Eyes: EOMI.  Musculoskeletal:  Left Knee Exam:  No effusion present. No swelling present.  Varus deformity.  Range of motion: 0 to 125 degrees.  Moderate crepitus on range of motion of the knee.  Positive medial joint line tenderness.  No lateral joint line tenderness.  The knee is stable.  Calves soft and nontender. Motor function intact in LE. Strength 5/5 LE bilaterally. Neuro: Distal pulses 2+. Sensation to light touch intact in LE.   Imaging Review Plain radiographs demonstrate severe degenerative joint disease of the left knee. The overall alignment is mild varus. The bone quality appears to be adequate for age and reported activity level.  Assessment/Plan:  End stage arthritis, left knee   The patient history, physical examination, clinical judgment of the provider and imaging studies are consistent with end stage degenerative joint disease of the left knee and total knee arthroplasty is deemed medically necessary. The treatment options including  medical management, injection therapy arthroscopy and arthroplasty were discussed at length. The risks and benefits of total knee arthroplasty were presented and reviewed. The risks due to aseptic loosening, infection, stiffness, patella tracking problems, thromboembolic complications and other imponderables were discussed. The patient acknowledged the explanation, agreed to proceed with the plan and consent was signed. Patient is being admitted for inpatient treatment for surgery, pain control, PT, OT, prophylactic antibiotics, VTE prophylaxis, progressive ambulation and ADLs and discharge planning. The patient is planning to be discharged   home .   Patient's anticipated LOS is less than 2 midnights, meeting these requirements: - Younger than 44 - Lives within 1 hour of care - Has a competent adult at home to recover with post-op recover - NO history of  - Diabetes  - Heart attack  - Stroke  - DVT/VTE  - Cardiac arrhythmia  - Respiratory Failure/COPD  - Renal failure  - Anemia  - Advanced Liver disease  Therapy Plans: Outpatient therapy at [facility] Disposition: Home with wife Planned DVT Prophylaxis: Eliquis 5 mg BID DME Needed: None PCP: Oliver Barre, MD (clearance received) Cardiologist: Tonny Bollman, MD (clearance received) TXA: IV Allergies: Sulfa (hives) Anesthesia Concerns: Sleep apnea BMI: 29.8 Last HgbA1c: Not diabetic.  Ph[facility]: [facility] ([place])  Other: - Hx CAD with stents (2005), CHF, pacemaker - Tramadol 50 mg QD - prescribed by rheumatology for multiple joints  - Patient was instructed on what medications to stop prior to surgery. - Follow-up visit in 2 weeks with Dr. Lequita Halt - Begin physical therapy following surgery - Pre-operative lab work as pre-surgical testing - Prescriptions will be provided in hospital at time of discharge  Arther Abbott, PA-C Orthopedic Surgery [facility] [place]

## 2021-11-14 ENCOUNTER — Other Ambulatory Visit: Payer: Self-pay | Admitting: Cardiovascular Disease

## 2021-11-21 ENCOUNTER — Ambulatory Visit (INDEPENDENT_AMBULATORY_CARE_PROVIDER_SITE_OTHER): Payer: Medicare Other

## 2021-11-21 DIAGNOSIS — S40862A Insect bite (nonvenomous) of left upper arm, initial encounter: Secondary | ICD-10-CM | POA: Diagnosis not present

## 2021-11-21 DIAGNOSIS — Z95810 Presence of automatic (implantable) cardiac defibrillator: Secondary | ICD-10-CM | POA: Diagnosis not present

## 2021-11-21 DIAGNOSIS — L218 Other seborrheic dermatitis: Secondary | ICD-10-CM | POA: Diagnosis not present

## 2021-11-21 DIAGNOSIS — H04129 Dry eye syndrome of unspecified lacrimal gland: Secondary | ICD-10-CM | POA: Diagnosis not present

## 2021-11-21 DIAGNOSIS — Z79899 Other long term (current) drug therapy: Secondary | ICD-10-CM | POA: Diagnosis not present

## 2021-11-21 DIAGNOSIS — I5022 Chronic systolic (congestive) heart failure: Secondary | ICD-10-CM

## 2021-11-21 DIAGNOSIS — M0569 Rheumatoid arthritis of multiple sites with involvement of other organs and systems: Secondary | ICD-10-CM | POA: Diagnosis not present

## 2021-11-21 DIAGNOSIS — L821 Other seborrheic keratosis: Secondary | ICD-10-CM | POA: Diagnosis not present

## 2021-11-21 NOTE — Progress Notes (Signed)
EPIC Encounter for ICM Monitoring  Patient Name: [name] is a 76 y.o. male Date: 11/21/2021 Primary Care Physican: Biagio Borg, MD Primary Cardiologist: Burt Knack Electrophysiologist: Curt Bears Bi-V Pacing:   93.1%  Effective 92.1%      07/04/2021 Weight: 197 lbs  10/17/2021 Weight: 190 lbs 11/21/2021 Weight: 190 lbs   Time in AT/AF  0.0 hr/day (0.0%)                                                           Spoke with patient and heart failure questions reviewed.  Pt asymptomatic for fluid accumulation.  He eats restaurants foods daily and does not adhere to low salt diet.    Optivol thoracic impedance continues to trending just below baseline suggesting possible fluid accumulation since 5/1.   Fluid index greater than normal threshold starting 5/16.   Prescribed: Spironolactone 25 mg take 0.5 tablet daily.   Labs: 09/27/2021 Creatinine 0.88, BUN 16, Potassium 3.7, Sodium 140, GFR 82.91 09/18/2021 Creatinine 0.86, BUN 16, Potassium 4.3, Sodium 141   Recommendations:  Encouraged to change eating habits and limit salt intake which is better for the heart.  Encouraged to call if fluid symptoms develop   Follow-up plan: ICM clinic phone appointment on 12/24/2021.   91 day device clinic remote transmission 01/31/2022.       EP/Cardiology Office Visits:   05/01/2022 with Dr Burt Knack.     Copy of ICM check sent to Dr. Curt Bears and will send to Dr Burt Knack as Juluis Rainier if patient is reached.   3 month ICM trend: 11/21/2021.    12-14 Month ICM trend:     [name], RN 11/21/2021 1:34 PM

## 2021-11-22 ENCOUNTER — Encounter (HOSPITAL_COMMUNITY): Payer: Self-pay | Admitting: Gastroenterology

## 2021-11-22 NOTE — Progress Notes (Signed)
Attempted to obtain medical history via telephone, unable to reach at this time. HIPAA compliant voicemail message left requesting return call to pre surgical testing department. 

## 2021-11-23 NOTE — Patient Instructions (Signed)
DUE TO COVID-19 ONLY TWO VISITORS  (aged 78 and older)  ARE ALLOWED TO COME WITH YOU AND STAY IN THE WAITING ROOM ONLY DURING PRE OP AND PROCEDURE.   **NO VISITORS ARE ALLOWED IN THE SHORT STAY AREA OR RECOVERY ROOM!!**  IF YOU WILL BE ADMITTED INTO THE HOSPITAL YOU ARE ALLOWED ONLY FOUR SUPPORT PEOPLE DURING VISITATION HOURS ONLY (7 AM -8PM)   The support person(s) must pass our screening, gel in and out, and wear a mask at all times, including in the patient's room. Patients must also wear a mask when staff or their support person are in the room. Visitors GUEST BADGE MUST BE WORN VISIBLY  One adult visitor may remain with you overnight and MUST be in the room by 8 P.M.     Your procedure is scheduled on: 12/10/21   Report to [facility] Main Entrance    Report to admitting at : 5:15 AM   Call this number if you have problems the morning of surgery [phone]   Do not eat food :After Midnight.   After Midnight you may have the following liquids until: 4:15 AM DAY OF SURGERY  Water Black Coffee (sugar ok, NO MILK/CREAM OR CREAMERS)  Tea (sugar ok, NO MILK/CREAM OR CREAMERS) regular and decaf                             Plain Jell-O (NO RED)                                           Fruit ices (not with fruit pulp, NO RED)                                     Popsicles (NO RED)                                                                  Juice: apple, WHITE grape, WHITE cranberry Sports drinks like Gatorade (NO RED) Clear broth(vegetable,chicken,beef)     Drink G2 drink AT :4:15 AM the morning of surgery.       The day of surgery:  Drink ONE (1) Pre-Surgery Clear Ensure or G2 at AM the morning of surgery. Drink in one sitting. Do not sip.  This drink was given to you during your hospital  pre-op appointment visit. Nothing else to drink after completing the  Pre-Surgery Clear Ensure or G2.          If you have questions, please contact your surgeon's office.    Oral Hygiene is also important to reduce your risk of infection.                                    Remember - BRUSH YOUR TEETH THE MORNING OF SURGERY WITH YOUR REGULAR TOOTHPASTE   Do NOT smoke after Midnight   Take these medicines the morning of surgery with A SIP OF WATER: carvedilol,tamsulosin,pantoprazole.Tylenol as needed.  DO  NOT TAKE ANY ORAL DIABETIC MEDICATIONS DAY OF YOUR SURGERY  Bring CPAP mask and tubing day of surgery.                              You may not have any metal on your body including hair pins, jewelry, and body piercing             Do not wear lotions, powders, perfumes/cologne, or deodorant              Men may shave face and neck.   Do not bring valuables to the hospital. [place].   Contacts, dentures or bridgework may not be worn into surgery.   Bring small overnight bag day of surgery.   DO NOT [place]. PHARMACY WILL DISPENSE MEDICATIONS LISTED ON YOUR MEDICATION LIST TO YOU DURING YOUR ADMISSION [place]!    Patients discharged on the day of surgery will not be allowed to drive home.  Someone NEEDS to stay with you for the first 24 hours after anesthesia.   Special Instructions: Bring a copy of your healthcare power of attorney and living will documents         the day of surgery if you haven't scanned them before.              Please read over the following fact sheets you were given: IF YOU HAVE QUESTIONS ABOUT YOUR PRE-OP INSTRUCTIONS PLEASE CALL [phone]     [facility] - Preparing for Surgery Before surgery, you can play an important role.  Because skin is not sterile, your skin needs to be as free of germs as possible.  You can reduce the number of germs on your skin by washing with CHG (chlorahexidine gluconate) soap before surgery.  CHG is an antiseptic cleaner which kills germs and bonds with the skin to continue killing germs even after  washing. Please DO NOT use if you have an allergy to CHG or antibacterial soaps.  If your skin becomes reddened/irritated stop using the CHG and inform your nurse when you arrive at Short Stay. Do not shave (including legs and underarms) for at least 48 hours prior to the first CHG shower.  You may shave your face/neck. Please follow these instructions carefully:  1.  Shower with CHG Soap the night before surgery and the  morning of Surgery.  2.  If you choose to wash your hair, wash your hair first as usual with your  normal  shampoo.  3.  After you shampoo, rinse your hair and body thoroughly to remove the  shampoo.                           4.  Use CHG as you would any other liquid soap.  You can apply chg directly  to the skin and wash                       Gently with a scrungie or clean washcloth.  5.  Apply the CHG Soap to your body ONLY FROM THE NECK DOWN.   Do not use on face/ open  Wound or open sores. Avoid contact with eyes, ears mouth and genitals (private parts).                       Wash face,  Genitals (private parts) with your normal soap.             6.  Wash thoroughly, paying special attention to the area where your surgery  will be performed.  7.  Thoroughly rinse your body with warm water from the neck down.  8.  DO NOT shower/wash with your normal soap after using and rinsing off  the CHG Soap.                9.  Pat yourself dry with a clean towel.            10.  Wear clean pajamas.            11.  Place clean sheets on your bed the night of your first shower and do not  sleep with pets. Day of Surgery : Do not apply any lotions/deodorants the morning of surgery.  Please wear clean clothes to the hospital/surgery center.  FAILURE TO FOLLOW THESE INSTRUCTIONS MAY RESULT IN THE CANCELLATION OF YOUR SURGERY PATIENT SIGNATURE_________________________________  NURSE  SIGNATURE__________________________________  ________________________________________________________________________   [name]  An incentive spirometer is a tool that can help keep your lungs clear and active. This tool measures how well you are filling your lungs with each breath. Taking long deep breaths may help reverse or decrease the chance of developing breathing (pulmonary) problems (especially infection) following: A long period of time when you are unable to move or be active. BEFORE THE PROCEDURE  If the spirometer includes an indicator to show your best effort, your nurse or respiratory therapist will set it to a desired goal. If possible, sit up straight or lean slightly forward. Try not to slouch. Hold the incentive spirometer in an upright position. INSTRUCTIONS FOR USE  Sit on the edge of your bed if possible, or sit up as far as you can in bed or on a chair. Hold the incentive spirometer in an upright position. Breathe out normally. Place the mouthpiece in your mouth and seal your lips tightly around it. Breathe in slowly and as deeply as possible, raising the piston or the ball toward the top of the column. Hold your breath for 3-5 seconds or for as long as possible. Allow the piston or ball to fall to the bottom of the column. Remove the mouthpiece from your mouth and breathe out normally. Rest for a few seconds and repeat Steps 1 through 7 at least 10 times every 1-2 hours when you are awake. Take your time and take a few normal breaths between deep breaths. The spirometer may include an indicator to show your best effort. Use the indicator as a goal to work toward during each repetition. After each set of 10 deep breaths, practice coughing to be sure your lungs are clear. If you have an incision (the cut made at the time of surgery), support your incision when coughing by placing a pillow or rolled up towels firmly against it. Once you are able to get out of  bed, walk around indoors and cough well. You may stop using the incentive spirometer when instructed by your caregiver.  RISKS AND COMPLICATIONS Take your time so you do not get dizzy or light-headed. If you are in pain, you may need to take or  ask for pain medication before doing incentive spirometry. It is harder to take a deep breath if you are having pain. AFTER USE Rest and breathe slowly and easily. It can be helpful to keep track of a log of your progress. Your caregiver can provide you with a simple table to help with this. If you are using the spirometer at home, follow these instructions: [place] IF:  You are having difficultly using the spirometer. You have trouble using the spirometer as often as instructed. Your pain medication is not giving enough relief while using the spirometer. You develop fever of 100.5 F (38.1 C) or higher. SEEK IMMEDIATE MEDICAL CARE IF:  You cough up bloody sputum that had not been present before. You develop fever of 102 F (38.9 C) or greater. You develop worsening pain at or near the incision site. MAKE SURE YOU:  Understand these instructions. Will watch your condition. Will get help right away if you are not doing well or get worse. Document Released: 09/16/2006 Document Revised: 07/29/2011 Document Reviewed: 11/17/2006 [facility] Patient Information 2014 [place], [place].   ________________________________________________________________________

## 2021-11-26 ENCOUNTER — Telehealth: Payer: Self-pay | Admitting: Cardiovascular Disease

## 2021-11-26 ENCOUNTER — Encounter (HOSPITAL_COMMUNITY): Payer: Self-pay

## 2021-11-26 ENCOUNTER — Encounter: Payer: Self-pay | Admitting: Cardiology

## 2021-11-26 ENCOUNTER — Other Ambulatory Visit: Payer: Self-pay

## 2021-11-26 ENCOUNTER — Encounter (HOSPITAL_COMMUNITY)
Admission: RE | Admit: 2021-11-26 | Discharge: 2021-11-26 | Disposition: A | Discharge status: Home / Self Care | Payer: Medicare Other | Source: Ambulatory Visit | Attending: Orthopedic Surgery | Admitting: Orthopedic Surgery

## 2021-11-26 VITALS — BP 108/53 | HR 81 | Temp 98.1°F | Ht 67.0 in | Wt 190.0 lb

## 2021-11-26 DIAGNOSIS — I340 Nonrheumatic mitral (valve) insufficiency: Secondary | ICD-10-CM | POA: Insufficient documentation

## 2021-11-26 DIAGNOSIS — G4733 Obstructive sleep apnea (adult) (pediatric): Secondary | ICD-10-CM | POA: Insufficient documentation

## 2021-11-26 DIAGNOSIS — I480 Paroxysmal atrial fibrillation: Secondary | ICD-10-CM | POA: Diagnosis not present

## 2021-11-26 DIAGNOSIS — Z955 Presence of coronary angioplasty implant and graft: Secondary | ICD-10-CM | POA: Insufficient documentation

## 2021-11-26 DIAGNOSIS — D128 Benign neoplasm of rectum: Secondary | ICD-10-CM | POA: Diagnosis not present

## 2021-11-26 DIAGNOSIS — I2510 Atherosclerotic heart disease of native coronary artery without angina pectoris: Secondary | ICD-10-CM | POA: Diagnosis not present

## 2021-11-26 DIAGNOSIS — I10 Essential (primary) hypertension: Secondary | ICD-10-CM | POA: Insufficient documentation

## 2021-11-26 DIAGNOSIS — R7303 Prediabetes: Secondary | ICD-10-CM | POA: Diagnosis not present

## 2021-11-26 DIAGNOSIS — R0609 Other forms of dyspnea: Secondary | ICD-10-CM | POA: Diagnosis not present

## 2021-11-26 DIAGNOSIS — Z01812 Encounter for preprocedural laboratory examination: Secondary | ICD-10-CM | POA: Insufficient documentation

## 2021-11-26 DIAGNOSIS — Z95810 Presence of automatic (implantable) cardiac defibrillator: Secondary | ICD-10-CM | POA: Insufficient documentation

## 2021-11-26 DIAGNOSIS — I447 Left bundle-branch block, unspecified: Secondary | ICD-10-CM | POA: Insufficient documentation

## 2021-11-26 DIAGNOSIS — K219 Gastro-esophageal reflux disease without esophagitis: Secondary | ICD-10-CM | POA: Diagnosis not present

## 2021-11-26 DIAGNOSIS — Z01818 Encounter for other preprocedural examination: Secondary | ICD-10-CM

## 2021-11-26 LAB — BASIC METABOLIC PANEL
Anion gap: 5 (ref 5–15)
BUN: 15 mg/dL (ref 8–23)
CO2: 29 mmol/L (ref 22–32)
Calcium: 9.6 mg/dL (ref 8.9–10.3)
Chloride: 107 mmol/L (ref 98–111)
Creatinine, Ser: 0.92 mg/dL (ref 0.61–1.24)
GFR, Estimated: >60 mL/min (ref >60)
Glucose, Bld: 145 mg/dL — ABNORMAL HIGH (ref 70–99)
Potassium: 3.9 mmol/L (ref 3.5–5.1)
Sodium: 141 mmol/L (ref 135–145)

## 2021-11-26 LAB — CBC
HCT: 41.9 % (ref 39.0–52.0)
Hemoglobin: 13.8 g/dL (ref 13.0–17.0)
MCH: 30.5 pg (ref 26.0–34.0)
MCHC: 32.9 g/dL (ref 30.0–36.0)
MCV: 92.5 fL (ref 80.0–100.0)
Platelets: 242 10*3/uL (ref 150–400)
RBC: 4.53 MIL/uL (ref 4.22–5.81)
RDW: 12.7 % (ref 11.5–15.5)
WBC: 5.4 10*3/uL (ref 4.0–10.5)
nRBC: 0 % (ref 0.0–0.2)

## 2021-11-26 LAB — SURGICAL PCR SCREEN
MRSA, PCR: NEGATIVE
Staphylococcus aureus: POSITIVE — AB

## 2021-11-26 LAB — GLUCOSE, CAPILLARY: Glucose-Capillary: 173 mg/dL — ABNORMAL HIGH (ref 70–99)

## 2021-11-26 NOTE — Progress Notes (Signed)
PCR: + STAPH °

## 2021-11-26 NOTE — Progress Notes (Signed)
PERIOPERATIVE PRESCRIPTION FOR IMPLANTED CARDIAC DEVICE PROGRAMMING  Patient Information: Name:  [name]  DOB:  [birth year]  MRN:  [id number]    Joline Maxcy, RN  P Cv Div Heartcare Device Planned Procedure:  Left total knee arthroplasty.  Surgeon:  Dr. Pilar Plate Aluisio.  Date of Procedure:  12/10/21  Cautery will be used.  Position during surgery:     Please send documentation back to:  [facility] (Fax # [phone])  Device Information:  Clinic EP Physician:  Allegra Lai, MD   Device Type:  Pacemaker and Defibrillator Manufacturer and Phone #:  [employer]: [id number] Pacemaker Dependent?:  No. Date of Last Device Check:  07/12/21 in clinic, 11/21/21 remote Normal Device Function?:  Yes.    Electrophysiologist's Recommendations:  Have magnet available. Provide continuous ECG monitoring when magnet is used or reprogramming is to be performed.  Procedure should not interfere with device function.  No device programming or magnet placement needed.  Per Device Clinic Standing Orders, Vergie Living [place], [place]  10:07 AM 11/26/2021

## 2021-11-26 NOTE — Progress Notes (Addendum)
For Short Stay: [place] appointment date: Date of COVID positive in last 62 days: NO  Bowel Prep reminder:   For Anesthesia: PCP - Dr. Cathlean Cower Cardiologist - Dr. [name]. LOV: 10/22/21: clearance: 11/15/21: Chart  Chest x-ray -  EKG - 10/22/21 Stress Test -  ECHO - 06/22/20 Cardiac Cath - 01/24/19 Pacemaker/ICD device last checked: 11/21/21 Pacemaker orders received: Requested. Received: 11/26/21 Device Rep notified:  Spinal Cord Stimulator:  Sleep Study - Yes CPAP - Yes  Fasting Blood Sugar - N/A Checks Blood Sugar __0___ times a day Date and result of last Hgb A1c- 6.0: 09/27/21  Blood Thinner Instructions:Eliquis will be held: 3 days before surgery. Aspirin Instructions: Last Dose:  Activity level: Can go up a flight of stairs and activities of daily living without stopping and without chest pain and/or shortness of breath   Able to exercise without chest pain and/or shortness of breath   Unable to go up a flight of stairs without chest pain and/or shortness of breath     Anesthesia review: Hx: CAD,CHF,BBB,OSA(CPAP). Pt. Is able to climb a flight of stairs with out getting SOB.  Patient denies shortness of breath, fever, cough and chest pain at PAT appointment   Patient verbalized understanding of instructions that were given to them at the PAT appointment. Patient was also instructed that they will need to review over the PAT instructions again at home before surgery.

## 2021-11-26 NOTE — Telephone Encounter (Signed)
Patient states he was at pre-admission testing today and his BP was low, it was 119/52 and 108/53.  They told him to call us to discuss his BP with [place]. He states his normal BP 120/80. He was at the eye doctor last week and it was low then.

## 2021-11-26 NOTE — Telephone Encounter (Signed)
Returned call to Pt.  He was concerned that he had 2 recent BP readings of 119/52 and 108/53.  His last OV BP with Dr. Burt Knack was 110/70.  He states he may be a little more tired the last couple of months.  He takes a daily nap.  He also thinks he is not drinking enough fluids.  He states Sharman Cheek has also encouraged him to drink more fluids.  Reiterated that with the weather being so hot, it was important to stay hydrated to prevent low blood pressures.  He indicates understanding and will try to drink more fluids.

## 2021-11-27 ENCOUNTER — Telehealth: Payer: Self-pay | Admitting: Gastroenterology

## 2021-11-27 NOTE — Anesthesia Preprocedure Evaluation (Signed)
Anesthesia Evaluation  Patient identified by MRN, date of birth, ID band Patient awake    Reviewed: Allergy & Precautions, H&P , NPO status , Patient's Chart, lab work & pertinent test results  Airway Mallampati: III  TM Distance: <3 FB Neck ROM: Full    Dental no notable dental hx. (+) Teeth Intact, Dental Advisory Given   Pulmonary shortness of breath and with exertion, sleep apnea and Continuous Positive Airway Pressure Ventilation , former smoker,    Pulmonary exam normal breath sounds clear to auscultation       Cardiovascular Exercise Tolerance: Good hypertension, + CAD and +CHF  Normal cardiovascular exam+ Cardiac Defibrillator  Rhythm:Regular Rate:Normal  CV: Echo 06/22/2020 1. Left ventricular ejection fraction, by estimation, is 50 to 55%. The  left ventricle has low normal function. The left ventricle has no regional  wall motion abnormalities. There is moderate concentric left ventricular  hypertrophy. Left ventricular  diastolic parameters are indeterminate.  2. Right ventricular systolic function is normal. The right ventricular  size is normal. There is normal pulmonary artery systolic pressure.  3. Left atrial size was mildly dilated.  4. The mitral valve is normal in structure. Mild mitral valve  regurgitation. No evidence of mitral stenosis.  5. The aortic valve is tricuspid. There is mild calcification of the  aortic valve. Aortic valve regurgitation is not visualized. No aortic  stenosis is present.   Cardiac Cath 01/24/2019 Mid LAD lesion is 30% stenosed. Non-stenotic Mid RCA lesion was previously treated. The left ventricular systolic function is normal. LV end diastolic pressure is normal. The left ventricular ejection fraction is 55-65% by visual estimate.  1. Nonobstructive CAD. Continued patency of the stent in the RCA 2. Good LV systolic function 3. Normal LVEDP   Neuro/Psych  Neuromuscular  disease negative psych ROS   GI/Hepatic Neg liver ROS, GERD  Medicated and Controlled,  Endo/Other  negative endocrine ROS  Renal/GU negative Renal ROS  negative genitourinary   Musculoskeletal  (+) Arthritis , Osteoarthritis,    Abdominal   Peds negative pediatric ROS (+)  Hematology negative hematology ROS (+)   Anesthesia Other Findings   Reproductive/Obstetrics negative OB ROS                           Anesthesia Physical Anesthesia Plan  ASA: 3  Anesthesia Plan: General   Post-op Pain Management: Minimal or no pain anticipated   Induction: Intravenous  PONV Risk Score and Plan: 2 and Ondansetron and Dexamethasone  Airway Management Planned: Oral ETT and Video Laryngoscope Planned  Additional Equipment: None  Intra-op Plan:   Post-operative Plan: Extubation in OR  Informed Consent: I have reviewed the patients History and Physical, chart, labs and discussed the procedure including the risks, benefits and alternatives for the proposed anesthesia with the patient or authorized representative who has indicated his/her understanding and acceptance.     Dental advisory given  Plan Discussed with: Anesthesiologist and CRNA  Anesthesia Plan Comments: (See PAT note 11/26/2021 DISCUSSION:76 y.o. former smoker with h/o HTN, GERD, OSA on CPAP, LBBB, paroxysmal atrial fibrillation (on Eliquis), CAD (stent to RCA), CHF, AICD in place, ampullary adenoma scheduled for above procedure 12/10/2021 with Dr. Justice Britain.   Pt advised to hold Eliquis 3 days prior to procedure.   Pt last seen by cardiology 10/22/2021. Per OV note, "The patient has exertional dyspnea which I think is related to his lack of activity/deconditioning. His weight has been up for  the past few years and he will work on weight loss once he undergoes knee surgery and is able to walk better. He is not having any angina and his heart rhythm is stable. His cardiac risk factors  appear to be well controlled. He has a low risk of cardiac complication and can proceed with knee replacement surgery in the future as scheduled without further cardiac testing."  Per previous anesthesia note, "difficulty with intubation: unable to see cords with miller or mac blade: glidescope used with good visualization.")       Anesthesia Quick Evaluation

## 2021-11-27 NOTE — Progress Notes (Addendum)
Anesthesia Chart Review   Case: [id number] Date/Time: 11/29/21 0730   Procedure: ENDOSCOPIC RETROGRADE CHOLANGIOPANCREATOGRAPHY (ERCP) WITH PROPOFOL   Anesthesia type: General   Pre-op diagnosis: Ampullary Adenoma   Location: [facility] / [facility]   Surgeons: Mansouraty, Telford Nab., MD       DISCUSSION:76 y.o. former smoker with h/o HTN, GERD, OSA on CPAP, LBBB, paroxysmal atrial fibrillation (on Eliquis), CAD (stent to RCA), CHF, AICD in place, ampullary adenoma scheduled for above procedure 12/10/2021 with Dr. Justice Britain.   Pt advised to hold Eliquis 3 days prior to procedure.   Pt last seen by cardiology 10/22/2021. Per OV note, "The patient has exertional dyspnea which I think is related to his lack of activity/deconditioning.  His weight has been up for the past few years and he will work on weight loss once he undergoes knee surgery and is able to walk better.  He is not having any angina and his heart rhythm is stable.  His cardiac risk factors appear to be well controlled.  He has a low risk of cardiac complication and can proceed with knee replacement surgery in the future as scheduled without further cardiac testing."  Per previous anesthesia note, "difficulty with intubation: unable to see cords with miller or mac blade: glidescope used with good visualization."  Anticipate pt can proceed with planned procedure barring acute status change.   VS: BP (!) 108/53   Pulse 81   Temp 36.7 C (Oral)   Ht _0  (1.702 m)   Wt 86.2 kg   SpO2 94%   BMI 29.76 kg/m   PROVIDERS: Biagio Borg, MD is PCP   [name], MD is Cardiologist  LABS: Labs reviewed: Acceptable for surgery. (all labs ordered are listed, but only abnormal results are displayed)  Labs Reviewed  SURGICAL PCR SCREEN - Abnormal; Notable for the following components:      Result Value   Staphylococcus aureus POSITIVE (*)    All other components within normal limits  BASIC METABOLIC PANEL -  Abnormal; Notable for the following components:   Glucose, Bld 145 (*)    All other components within normal limits  GLUCOSE, CAPILLARY - Abnormal; Notable for the following components:   Glucose-Capillary 173 (*)    All other components within normal limits  CBC     IMAGES:   EKG:    CV: Echo 06/22/2020 1. Left ventricular ejection fraction, by estimation, is 50 to 55%. The  left ventricle has low normal function. The left ventricle has no regional  wall motion abnormalities. There is moderate concentric left ventricular  hypertrophy. Left ventricular  diastolic parameters are indeterminate.   2. Right ventricular systolic function is normal. The right ventricular  size is normal. There is normal pulmonary artery systolic pressure.   3. Left atrial size was mildly dilated.   4. The mitral valve is normal in structure. Mild mitral valve  regurgitation. No evidence of mitral stenosis.   5. The aortic valve is tricuspid. There is mild calcification of the  aortic valve. Aortic valve regurgitation is not visualized. No aortic  stenosis is present.   Cardiac Cath 01/24/2019 Mid LAD lesion is 30% stenosed. Non-stenotic Mid RCA lesion was previously treated. The left ventricular systolic function is normal. LV end diastolic pressure is normal. The left ventricular ejection fraction is 55-65% by visual estimate.   1. Nonobstructive CAD. Continued patency of the stent in the RCA 2. Good LV systolic function 3. Normal LVEDP  Plan: will obtain an Echo. Consider a trial of colchicine. Resume Eliquis tomorrow am. Past Medical History:  Diagnosis Date   AICD (automatic cardioverter/defibrillator) present    Allergic rhinitis    Allergy    Anginal pain ([place]) 05/26/14   chest pain after chasing dog   Atypical nevus of back 04/27/2003   moderate - mid lower back   Basal cell carcinoma 01/26/2008   right cheek - MOHs   CAD (coronary artery disease)    hx of stent- 2005 RCA    Cataract    beginning stage both eyes   CHF (congestive heart failure) (HCC)    pacemaker Medtronic     Chronic back pain    intermittent   Constipation    Dizziness    Dysrhythmia    right bundle branch block    Esophageal stricture    GERD (gastroesophageal reflux disease)    Hemoptysis    Hiatal hernia    History of colonic polyps    hyperplastic   HTN (hypertension)    Hyperlipidemia    Hypertrophy of prostate with urinary obstruction and other lower urinary tract symptoms (LUTS)    OA (osteoarthritis)    OSA (obstructive sleep apnea)    cpap- 10    Other specified disorder of stomach and duodenum    duodenal periampulary tubulovillous adenoma removed by Dr. Ardis Hughs 5/10   Pericarditis 07/06/2019   Pneumonia    Pre-diabetes    no medications   Shortness of breath    with exertion   Sleep apnea    cpap   Testicular hypofunction     Past Surgical History:  Procedure Laterality Date   BILIARY STENT PLACEMENT N/A 03/19/2021   Procedure: BILIARY STENT PLACEMENT;  Surgeon: Irving Copas., MD;  Location: [facility];  Service: Gastroenterology;  Laterality: N/A;   BIV ICD INSERTION CRT-D N/A 03/27/2017   Procedure: BIV ICD INSERTION CRT-D;  Surgeon: Constance Haw, MD;  Location: [place] [facility];  Service: Cardiovascular;  Laterality: N/A;   CARDIAC CATHETERIZATION     '05, last 2009, showing patent RCA stent   COLONOSCOPY  12/2007   HYPERPLASTIC POLYP   COLONOSCOPY  12/2019   COLONOSCOPY WITH PROPOFOL N/A 06/02/2014   Procedure: COLONOSCOPY WITH PROPOFOL;  Surgeon: [name], MD;  [facility]: [facility];  Service: Endoscopy;  Laterality: N/A;   CORONARY ANGIOPLASTY  08/2003   ENDOSCOPIC MUCOSAL RESECTION  03/19/2021   Procedure: ENDOSCOPIC MUCOSAL RESECTION, ampullectomy;  Surgeon: Irving Copas., MD;  [facility]: [facility];  Service: Gastroenterology;;   ENDOSCOPIC RETROGRADE CHOLANGIOPANCREATOGRAPHY (ERCP) WITH PROPOFOL N/A 03/19/2021    Procedure: ENDOSCOPIC RETROGRADE CHOLANGIOPANCREATOGRAPHY (ERCP) WITH PROPOFOL;  Surgeon: Irving Copas., MD;  Location: [facility];  Service: Gastroenterology;  Laterality: N/A;   ESOPHAGOGASTRODUODENOSCOPY (EGD) WITH PROPOFOL N/A 06/02/2014   Procedure: ESOPHAGOGASTRODUODENOSCOPY (EGD) WITH PROPOFOL;  Surgeon: [name], MD;  [facility];  Service: Endoscopy;  Laterality: N/A;   ESOPHAGOGASTRODUODENOSCOPY (EGD) WITH PROPOFOL N/A 03/19/2021   Procedure: ESOPHAGOGASTRODUODENOSCOPY (EGD) WITH PROPOFOL;  Surgeon: Rush Landmark Telford Nab., MD;  [facility];  Service: Gastroenterology;  Laterality: N/A;   ESOPHAGOGASTRODUODENOSCOPY (EGD) WITH PROPOFOL N/A 03/23/2021   Procedure: ESOPHAGOGASTRODUODENOSCOPY (EGD) WITH PROPOFOL;  Surgeon: Ladene Artist, MD;  [facility];  Service: Endoscopy;  Laterality: N/A;   hernia surgery x 3     Bilateral Inguinal, Umbicial   INSERTION OF MESH N/A 03/20/2018   Procedure: INSERTION OF MESH;  Surgeon: Alphonsa Overall, MD;  Location:  [facility];  Service: General;  Laterality: N/A;   IRRIGATION AND DEBRIDEMENT ABSCESS Left 08/14/2012   Procedure: IRRIGATION AND DEBRIDEMENT LEFT INGUINAL BOIL ;  Surgeon: Ailene Rud, MD;  [facility];  Service: Urology;  Laterality: Left;   JOINT REPLACEMENT Right 2017   KNEE ARTHROPLASTY     LEFT HEART CATH AND CORONARY ANGIOGRAPHY N/A 01/24/2019   Procedure: LEFT HEART CATH AND CORONARY ANGIOGRAPHY;  Surgeon: Martinique, Peter M, MD;  Location: [place] [facility];  Service: Cardiovascular;  Laterality: N/A;   LEFT HEART CATHETERIZATION WITH CORONARY ANGIOGRAM N/A 05/26/2014   Procedure: LEFT HEART CATHETERIZATION WITH CORONARY ANGIOGRAM;  Surgeon: Blane Ohara, MD;  Location: [facility] C[facility];  Service: Cardiovascular;  Laterality: N/A;   LUMBAR LAMINECTOMY/DECOMPRESSION MICRODISCECTOMY Left 09/26/2014   Procedure: Lumbar Laminectomy for resection of synovial cyst  Lumbar five- sacral one  left;  Surgeon: Kary Kos, MD;  Location: [facility];  Service: Neurosurgery;  Laterality: Left;   MOLE REMOVAL Left 03/20/2018   Procedure: MOLE REMOVAL;  Surgeon: Alphonsa Overall, MD;  Location: [place];  Service: General;  Laterality: Left;   Oral surg to removed growth from ?sinus  10/2010   ?Dermoid removed by DrRiggs   PACEMAKER INSERTION  04/03/2017   PANCREATIC STENT PLACEMENT  03/19/2021   Procedure: PANCREATIC STENT PLACEMENT;  Surgeon: Irving Copas., MD;  [facility];  Service: Gastroenterology;;   POLYPECTOMY     RCA stenting     '05 RCA   right toe surgery  Right    Cyst    RIGHT/LEFT HEART CATH AND CORONARY ANGIOGRAPHY N/A 01/15/2017   Procedure: RIGHT/LEFT HEART CATH AND CORONARY ANGIOGRAPHY;  Surgeon: [name], MD;  Location: [place] [facility];  Service: Cardiovascular;  Laterality: N/A;   s/p right knee arthroscopy  2005   SPHINCTEROTOMY  03/19/2021   Procedure: SPHINCTEROTOMY;  Surgeon: Mansouraty, Telford Nab., MD;  Location: [facility];  Service: Gastroenterology;;   [place] INJECTION  03/19/2021   Procedure: SUBMUCOSAL LIFTING INJECTION;  Surgeon: Irving Copas., MD;  Location: [facility];  Service: Gastroenterology;;   thumb surgery Right    TOTAL KNEE ARTHROPLASTY Right 03/12/2016   Procedure: RIGHT TOTAL KNEE ARTHROPLASTY;  Surgeon: Paralee Cancel, MD;  [facility];  Service: Orthopedics;  Laterality: Right;   UPPER ESOPHAGEAL ENDOSCOPIC ULTRASOUND (EUS) N/A 03/19/2021   Procedure: UPPER ESOPHAGEAL ENDOSCOPIC ULTRASOUND (EUS);  Surgeon: Irving Copas., MD;  Location: [facility];  Service: Gastroenterology;  Laterality: N/A;   UPPER GASTROINTESTINAL ENDOSCOPY     VENTRAL HERNIA REPAIR N/A 03/20/2018   Procedure: LAPAROSCOPIC VENTRAL INCISIONAL  HERNIA ERAS PATHWAY;  Surgeon: Alphonsa Overall, MD;  Location: [facility];  Service: General;  Laterality: N/A;    MEDICATIONS:  acetaminophen (TYLENOL) 650 MG CR tablet    atorvastatin (LIPITOR) 20 MG tablet   b complex vitamins tablet   carvedilol (COREG) 25 MG tablet   cholecalciferol (VITAMIN D3) 25 MCG (1000 UNIT) tablet   Coenzyme Q10 (CO Q 10 PO)   diclofenac sodium (VOLTAREN) 1 % GEL   ELIQUIS 5 MG TABS tablet   Flaxseed, Linseed, (FLAXSEED OIL PO)   folic acid (FOLVITE) 056 MCG tablet   Glucosamine HCl (GLUCOSAMINE PO)   hydrocortisone 2.5 % cream   hydroxychloroquine (PLAQUENIL) 200 MG tablet   Lutein 20 MG TABS   Multiple Vitamin (MULTIVITAMIN) capsule   nabumetone (RELAFEN) 500 MG tablet   nitroGLYCERIN (NITROSTAT) 0.4 MG SL tablet   NON FORMULARY  pantoprazole (PROTONIX) 40 MG tablet   polyethylene glycol (MIRALAX / GLYCOLAX) 17 g packet   Probiotic Product (PROBIOTIC DAILY PO)   RESTASIS 0.05 % ophthalmic emulsion   sacubitril-valsartan (ENTRESTO) 49-51 MG   Saw Palmetto, Serenoa repens, (SAW PALMETTO PO)   spironolactone (ALDACTONE) 25 MG tablet   sucralfate (CARAFATE) 1 g tablet   tamsulosin (FLOMAX) 0.4 MG CAPS capsule   Testosterone 20 % CREA   traMADol (ULTRAM) 50 MG tablet   TURMERIC PO   vitamin C (ASCORBIC ACID) 500 MG tablet   zinc gluconate 50 MG tablet   No current facility-administered medications for this encounter.     Konrad Felix Ward, PA-C [facility] [phone]

## 2021-11-27 NOTE — Telephone Encounter (Signed)
Inbound call from patient requesting to speak with a nurse in regarding a test that came back abnormal for a staff infection. Patient is currently scheduled at the hospital for a procedure. Please give patient a call back to further advise.  Thank you

## 2021-11-27 NOTE — Telephone Encounter (Signed)
The pt has a knee surgery coming up and was swabbed for MRSA and Staph.  He saw on MyChart that his staph level was high.  He wanted to make sure that this would not interfere with his upcoming endoscopic procedure.  I did call endo and confirm that he does not need to do anything differently.  He has no open wounds or infections.  I have advised him that he can proceed as planned.

## 2021-11-29 ENCOUNTER — Ambulatory Visit (HOSPITAL_BASED_OUTPATIENT_CLINIC_OR_DEPARTMENT_OTHER): Payer: Medicare Other | Admitting: Anesthesiology

## 2021-11-29 ENCOUNTER — Other Ambulatory Visit: Payer: Self-pay

## 2021-11-29 ENCOUNTER — Ambulatory Visit (HOSPITAL_COMMUNITY)
Admission: RE | Admit: 2021-11-29 | Discharge: 2021-11-29 | Disposition: A | Discharge status: Home / Self Care | Payer: Medicare Other | Attending: Gastroenterology | Admitting: Gastroenterology

## 2021-11-29 ENCOUNTER — Ambulatory Visit (HOSPITAL_COMMUNITY): Payer: Medicare Other | Admitting: Physician Assistant

## 2021-11-29 ENCOUNTER — Encounter (HOSPITAL_COMMUNITY)
Admission: RE | Disposition: A | Discharge status: Home / Self Care | Payer: Self-pay | Source: Home / Self Care | Attending: Gastroenterology

## 2021-11-29 ENCOUNTER — Encounter (HOSPITAL_COMMUNITY): Payer: Self-pay | Admitting: Gastroenterology

## 2021-11-29 ENCOUNTER — Ambulatory Visit (HOSPITAL_COMMUNITY): Payer: Medicare Other

## 2021-11-29 DIAGNOSIS — I5022 Chronic systolic (congestive) heart failure: Secondary | ICD-10-CM | POA: Diagnosis not present

## 2021-11-29 DIAGNOSIS — Z4659 Encounter for fitting and adjustment of other gastrointestinal appliance and device: Secondary | ICD-10-CM | POA: Diagnosis not present

## 2021-11-29 DIAGNOSIS — K219 Gastro-esophageal reflux disease without esophagitis: Secondary | ICD-10-CM | POA: Diagnosis not present

## 2021-11-29 DIAGNOSIS — I110 Hypertensive heart disease with heart failure: Secondary | ICD-10-CM | POA: Insufficient documentation

## 2021-11-29 DIAGNOSIS — Z87891 Personal history of nicotine dependence: Secondary | ICD-10-CM | POA: Insufficient documentation

## 2021-11-29 DIAGNOSIS — K838 Other specified diseases of biliary tract: Secondary | ICD-10-CM

## 2021-11-29 DIAGNOSIS — I2510 Atherosclerotic heart disease of native coronary artery without angina pectoris: Secondary | ICD-10-CM

## 2021-11-29 DIAGNOSIS — Z9889 Other specified postprocedural states: Secondary | ICD-10-CM | POA: Insufficient documentation

## 2021-11-29 DIAGNOSIS — G4730 Sleep apnea, unspecified: Secondary | ICD-10-CM | POA: Diagnosis not present

## 2021-11-29 DIAGNOSIS — I509 Heart failure, unspecified: Secondary | ICD-10-CM | POA: Insufficient documentation

## 2021-11-29 DIAGNOSIS — D135 Benign neoplasm of extrahepatic bile ducts: Secondary | ICD-10-CM | POA: Diagnosis not present

## 2021-11-29 HISTORY — PX: ENDOSCOPIC RETROGRADE CHOLANGIOPANCREATOGRAPHY (ERCP) WITH PROPOFOL: SHX5810

## 2021-11-29 HISTORY — PX: STENT REMOVAL: SHX6421

## 2021-11-29 HISTORY — PX: REMOVAL OF STONES: SHX5545

## 2021-11-29 SURGERY — ENDOSCOPIC RETROGRADE CHOLANGIOPANCREATOGRAPHY (ERCP) WITH PROPOFOL
Anesthesia: General

## 2021-11-29 MED ORDER — ACETAMINOPHEN 325 MG PO TABS: 325.0000 mg | ORAL_TABLET | ORAL | Status: DC | PRN

## 2021-11-29 MED ORDER — SUGAMMADEX SODIUM 200 MG/2ML IV SOLN
INTRAVENOUS | Status: DC | PRN
  Administered 2021-11-29: 200 mg via INTRAVENOUS

## 2021-11-29 MED ORDER — ROCURONIUM BROMIDE 10 MG/ML (PF) SYRINGE
PREFILLED_SYRINGE | INTRAVENOUS | Status: DC | PRN
  Administered 2021-11-29: 60 mg via INTRAVENOUS

## 2021-11-29 MED ORDER — GLUCAGON HCL RDNA (DIAGNOSTIC) 1 MG IJ SOLR
INTRAMUSCULAR | Status: DC | PRN
  Administered 2021-11-29: .25 mg via INTRAVENOUS

## 2021-11-29 MED ORDER — OXYCODONE HCL 5 MG/5ML PO SOLN: 5.0000 mg | Freq: Once | ORAL | Status: DC | PRN

## 2021-11-29 MED ORDER — FENTANYL CITRATE (PF) 250 MCG/5ML IJ SOLN
INTRAMUSCULAR | Status: DC | PRN
Start: 2021-11-29 — End: 2021-11-29

## 2021-11-29 MED ORDER — DICLOFENAC SUPPOSITORY 100 MG
RECTAL | Status: DC | PRN
  Administered 2021-11-29: 100 mg via RECTAL

## 2021-11-29 MED ORDER — LACTATED RINGERS IV SOLN: INTRAVENOUS | Status: DC | PRN

## 2021-11-29 MED ORDER — SODIUM CHLORIDE 0.9 % IV SOLN
INTRAVENOUS | Status: DC | PRN
  Administered 2021-11-29: 18 mL

## 2021-11-29 MED ORDER — DICLOFENAC SUPPOSITORY 100 MG
RECTAL | Status: AC
  Filled 2021-11-29: qty 1

## 2021-11-29 MED ORDER — ACETAMINOPHEN 160 MG/5ML PO SOLN: 325.0000 mg | ORAL | Status: DC | PRN

## 2021-11-29 MED ORDER — CIPROFLOXACIN IN D5W 400 MG/200ML IV SOLN
INTRAVENOUS | Status: AC
  Filled 2021-11-29: qty 200

## 2021-11-29 MED ORDER — LIDOCAINE 2% (20 MG/ML) 5 ML SYRINGE
INTRAMUSCULAR | Status: DC | PRN
  Administered 2021-11-29: 60 mg via INTRAVENOUS

## 2021-11-29 MED ORDER — FENTANYL CITRATE (PF) 100 MCG/2ML IJ SOLN
INTRAMUSCULAR | Status: DC | PRN
  Administered 2021-11-29 (×2): 50 ug via INTRAVENOUS

## 2021-11-29 MED ORDER — ONDANSETRON HCL 4 MG/2ML IJ SOLN
INTRAMUSCULAR | Status: DC | PRN
  Administered 2021-11-29: 4 mg via INTRAVENOUS

## 2021-11-29 MED ORDER — FENTANYL CITRATE (PF) 100 MCG/2ML IJ SOLN: 25.0000 ug | INTRAMUSCULAR | Status: DC | PRN

## 2021-11-29 MED ORDER — GLUCAGON HCL RDNA (DIAGNOSTIC) 1 MG IJ SOLR
INTRAMUSCULAR | Status: AC
  Filled 2021-11-29: qty 1

## 2021-11-29 MED ORDER — CIPROFLOXACIN IN D5W 400 MG/200ML IV SOLN
INTRAVENOUS | Status: DC | PRN
  Administered 2021-11-29: 400 mg via INTRAVENOUS

## 2021-11-29 MED ORDER — PHENYLEPHRINE HCL-NACL 20-0.9 MG/250ML-% IV SOLN
INTRAVENOUS | Status: DC | PRN
  Administered 2021-11-29: 50 ug/min via INTRAVENOUS

## 2021-11-29 MED ORDER — PROPOFOL 10 MG/ML IV BOLUS
INTRAVENOUS | Status: DC | PRN
  Administered 2021-11-29: 200 mg via INTRAVENOUS

## 2021-11-29 MED ORDER — MEPERIDINE HCL 25 MG/ML IJ SOLN: 6.2500 mg | INTRAMUSCULAR | Status: DC | PRN

## 2021-11-29 MED ORDER — OXYCODONE HCL 5 MG PO TABS: 5.0000 mg | ORAL_TABLET | Freq: Once | ORAL | Status: DC | PRN

## 2021-11-29 MED ORDER — DEXAMETHASONE SODIUM PHOSPHATE 10 MG/ML IJ SOLN
INTRAMUSCULAR | Status: DC | PRN
  Administered 2021-11-29: 10 mg via INTRAVENOUS

## 2021-11-29 MED ORDER — ONDANSETRON HCL 4 MG/2ML IJ SOLN: 4.0000 mg | Freq: Once | INTRAMUSCULAR | Status: DC | PRN

## 2021-11-29 NOTE — H&P (Signed)
GASTROENTEROLOGY PROCEDURE H&P NOTE   Primary Care Physician: Biagio Borg, MD  HPI: [name] is a 76 y.o. male who presents for ERCP for follow up of ampullary adenoma s/p endoscopic resection in 2022 and biliary stent removal.  Past Medical History:  Diagnosis Date   AICD (automatic cardioverter/defibrillator) present    Allergic rhinitis    Allergy    Anginal pain ([place]) 05/26/14   chest pain after chasing dog   Atypical nevus of back 04/27/2003   moderate - mid lower back   Basal cell carcinoma 01/26/2008   right cheek - MOHs   CAD (coronary artery disease)    hx of stent- 2005 RCA   Cataract    beginning stage both eyes   CHF (congestive heart failure) (HCC)    pacemaker Medtronic     Chronic back pain    intermittent   Constipation    Dizziness    Dysrhythmia    right bundle branch block    Esophageal stricture    GERD (gastroesophageal reflux disease)    Hemoptysis    Hiatal hernia    History of colonic polyps    hyperplastic   HTN (hypertension)    Hyperlipidemia    Hypertrophy of prostate with urinary obstruction and other lower urinary tract symptoms (LUTS)    OA (osteoarthritis)    OSA (obstructive sleep apnea)    cpap- 10    Other specified disorder of stomach and duodenum    duodenal periampulary tubulovillous adenoma removed by Dr. Ardis Hughs 5/10   Pericarditis 07/06/2019   Pneumonia    Pre-diabetes    no medications   Shortness of breath    with exertion   Sleep apnea    cpap   Testicular hypofunction    Past Surgical History:  Procedure Laterality Date   BILIARY STENT PLACEMENT N/A 03/19/2021   Procedure: BILIARY STENT PLACEMENT;  Surgeon: Irving Copas., MD;  Location: [facility];  Service: Gastroenterology;  Laterality: N/A;   BIV ICD INSERTION CRT-D N/A 03/27/2017   Procedure: BIV ICD INSERTION CRT-D;  Surgeon: Constance Haw, MD;  Location: [place] [facility];  Service: Cardiovascular;  Laterality: N/A;   CARDIAC  CATHETERIZATION     '05, last 2009, showing patent RCA stent   COLONOSCOPY  12/2007   HYPERPLASTIC POLYP   COLONOSCOPY  12/2019   COLONOSCOPY WITH PROPOFOL N/A 06/02/2014   Procedure: COLONOSCOPY WITH PROPOFOL;  Surgeon: [name], MD;  [facility];  Service: Endoscopy;  Laterality: N/A;   CORONARY ANGIOPLASTY  08/2003   ENDOSCOPIC MUCOSAL RESECTION  03/19/2021   Procedure: ENDOSCOPIC MUCOSAL RESECTION, ampullectomy;  Surgeon: Irving Copas., MD;  [facility];  Service: Gastroenterology;;   ENDOSCOPIC RETROGRADE CHOLANGIOPANCREATOGRAPHY (ERCP) WITH PROPOFOL N/A 03/19/2021   Procedure: ENDOSCOPIC RETROGRADE CHOLANGIOPANCREATOGRAPHY (ERCP) WITH PROPOFOL;  Surgeon: Irving Copas., MD;  Location: [facility];  Service: Gastroenterology;  Laterality: N/A;   ESOPHAGOGASTRODUODENOSCOPY (EGD) WITH PROPOFOL N/A 06/02/2014   Procedure: ESOPHAGOGASTRODUODENOSCOPY (EGD) WITH PROPOFOL;  Surgeon: [name], MD;  [facility];  Service: Endoscopy;  Laterality: N/A;   ESOPHAGOGASTRODUODENOSCOPY (EGD) WITH PROPOFOL N/A 03/19/2021   Procedure: ESOPHAGOGASTRODUODENOSCOPY (EGD) WITH PROPOFOL;  Surgeon: Rush Landmark Telford Nab., MD;  [facility];  Service: Gastroenterology;  Laterality: N/A;   ESOPHAGOGASTRODUODENOSCOPY (EGD) WITH PROPOFOL N/A 03/23/2021   Procedure: ESOPHAGOGASTRODUODENOSCOPY (EGD) WITH PROPOFOL;  Surgeon: Ladene Artist, MD;  [facility];  Service: Endoscopy;  Laterality: N/A;   hernia surgery x  3     Bilateral Inguinal, Umbicial   INSERTION OF MESH N/A 03/20/2018   Procedure: INSERTION OF MESH;  Surgeon: Alphonsa Overall, MD;  Location: [place];  Service: General;  Laterality: N/A;   IRRIGATION AND DEBRIDEMENT ABSCESS Left 08/14/2012   Procedure: IRRIGATION AND DEBRIDEMENT LEFT INGUINAL BOIL ;  Surgeon: Ailene Rud, MD;  [facility];  Service: Urology;  Laterality: Left;   JOINT REPLACEMENT Right 2017    KNEE ARTHROPLASTY     LEFT HEART CATH AND CORONARY ANGIOGRAPHY N/A 01/24/2019   Procedure: LEFT HEART CATH AND CORONARY ANGIOGRAPHY;  Surgeon: Martinique, Peter M, MD;  Location: [place] [facility];  Service: Cardiovascular;  Laterality: N/A;   LEFT HEART CATHETERIZATION WITH CORONARY ANGIOGRAM N/A 05/26/2014   Procedure: LEFT HEART CATHETERIZATION WITH CORONARY ANGIOGRAM;  Surgeon: Blane Ohara, MD;  Location: [facility] LAB;  Service: Cardiovascular;  Laterality: N/A;   LUMBAR LAMINECTOMY/DECOMPRESSION MICRODISCECTOMY Left 09/26/2014   Procedure: Lumbar Laminectomy for resection of synovial cyst  Lumbar five- sacral one left;  Surgeon: Kary Kos, MD;  Location: [place] [facility];  Service: Neurosurgery;  Laterality: Left;   MOLE REMOVAL Left 03/20/2018   Procedure: MOLE REMOVAL;  Surgeon: Alphonsa Overall, MD;  Location: [place];  Service: General;  Laterality: Left;   Oral surg to removed growth from ?sinus  10/2010   ?Dermoid removed by DrRiggs   PACEMAKER INSERTION  04/03/2017   PANCREATIC STENT PLACEMENT  03/19/2021   Procedure: PANCREATIC STENT PLACEMENT;  Surgeon: Irving Copas., MD;  [facility];  Service: Gastroenterology;;   POLYPECTOMY     RCA stenting     '05 RCA   right toe surgery  Right    Cyst    RIGHT/LEFT HEART CATH AND CORONARY ANGIOGRAPHY N/A 01/15/2017   Procedure: RIGHT/LEFT HEART CATH AND CORONARY ANGIOGRAPHY;  Surgeon: [name], MD;  Location: [place] [facility];  Service: Cardiovascular;  Laterality: N/A;   s/p right knee arthroscopy  2005   SPHINCTEROTOMY  03/19/2021   Procedure: SPHINCTEROTOMY;  Surgeon: Mansouraty, Telford Nab., MD;  Location: [facility];  Service: Gastroenterology;;   [place] INJECTION  03/19/2021   Procedure: SUBMUCOSAL LIFTING INJECTION;  Surgeon: Irving Copas., MD;  Location: [facility];  Service: Gastroenterology;;   thumb surgery Right    TOTAL KNEE ARTHROPLASTY Right 03/12/2016   Procedure: RIGHT TOTAL KNEE  ARTHROPLASTY;  Surgeon: Paralee Cancel, MD;  [facility];  Service: Orthopedics;  Laterality: Right;   UPPER ESOPHAGEAL ENDOSCOPIC ULTRASOUND (EUS) N/A 03/19/2021   Procedure: UPPER ESOPHAGEAL ENDOSCOPIC ULTRASOUND (EUS);  Surgeon: Irving Copas., MD;  Location: [facility];  Service: Gastroenterology;  Laterality: N/A;   UPPER GASTROINTESTINAL ENDOSCOPY     VENTRAL HERNIA REPAIR N/A 03/20/2018   Procedure: LAPAROSCOPIC VENTRAL INCISIONAL  HERNIA ERAS PATHWAY;  Surgeon: Alphonsa Overall, MD;  Location: [place];  Service: General;  Laterality: N/A;   No current facility-administered medications for this encounter.   No current facility-administered medications for this encounter. Allergies  Allergen Reactions   Sulfonamide Derivatives Rash        Family History  Problem Relation Age of Onset   Heart disease Mother    Coronary artery disease Father    Diabetes Father    Sudden death Father        due to heart disease   Heart disease Father    COPD Brother    Arthritis Brother    Heart failure Brother    Cancer Brother  lung    Stomach cancer Paternal Grandmother    Arthritis Daughter    Hypertension Other    Colon cancer Neg Hx    Esophageal cancer Neg Hx    Colon polyps Neg Hx    Ulcerative colitis Neg Hx    Liver disease Neg Hx    Pancreatic cancer Neg Hx    Inflammatory bowel disease Neg Hx    Rectal cancer Neg Hx    Social History   Socioeconomic History   Marital status: Married    Spouse name: Not on file   Number of children: 1   Years of education: Not on file   Highest education level: Not on file  Occupational History   Occupation: Lobbyist: [employer]  Tobacco Use   Smoking status: Former    Types: Cigarettes    Quit date: 05/20/1990    Years since quitting: 31.5    Passive exposure: Never   Smokeless tobacco: Never   Tobacco comments:    previous 30 pack year history  Vaping Use   Vaping Use: Never used   Substance and Sexual Activity   Alcohol use: Not Currently    Comment: rare   Drug use: Never   Sexual activity: Not on file  Other Topics Concern   Not on file  Social History Narrative   Not on file   Social Determinants of Health   Financial Resource Strain: Not on file  Food Insecurity: Not on file  Transportation Needs: Not on file  Physical Activity: Not on file  Stress: Not on file  Social Connections: Not on file  Intimate Partner Violence: Not on file    Physical Exam: Today's Vitals   11/29/21 0700  BP: (!) 144/68  Pulse: 67  Resp: 16  Temp: 97.8 F (36.6 C)  TempSrc: Temporal  SpO2: 95%  Weight: 86.2 kg  Height: '5\' 7"'$  (1.702 m)  PainSc: 3    Body mass index is 29.76 kg/m. GEN: NAD EYE: Sclerae anicteric ENT: MMM CV: Non-tachycardic GI: Soft, NT/ND NEURO:  Alert & Oriented x 3  Lab Results: Recent Labs    11/26/21 0830  WBC 5.4  HGB 13.8  HCT 41.9  PLT 242   BMET Recent Labs    11/26/21 0830  NA 141  K 3.9  CL 107  CO2 29  GLUCOSE 145*  BUN 15  CREATININE 0.92  CALCIUM 9.6   LFT No results for input(s): "PROT", "ALBUMIN", "AST", "ALT", "ALKPHOS", "BILITOT", "BILIDIR", "IBILI" in the last 72 hours. PT/INR No results for input(s): "LABPROT", "INR" in the last 72 hours.   Impression / Plan: This is a 76 y.o.male who presents for ERCP for follow up of ampullary adenoma s/p endoscopic resection in 2022 and biliary stent removal.  The risks of an ERCP were discussed at length, including but not limited to the risk of perforation, bleeding, abdominal pain, post-ERCP pancreatitis (while usually mild can be severe and even life threatening).   The risks and benefits of endoscopic evaluation/treatment were discussed with the patient and/or family; these include but are not limited to the risk of perforation, infection, bleeding, missed lesions, lack of diagnosis, severe illness requiring hospitalization, as well as anesthesia and  sedation related illnesses.  The patient's history has been reviewed, patient examined, no change in status, and deemed stable for procedure.  The patient and/or family is agreeable to proceed.    Justice Britain, MD [facility] Office #  [phone]  

## 2021-11-29 NOTE — Anesthesia Procedure Notes (Signed)
Procedure Name: Intubation Date/Time: 11/29/2021 7:58 AM  Performed by: Mariea Clonts, CRNAPre-anesthesia Checklist: Patient identified, Emergency Drugs available, Suction available and Patient being monitored Patient Re-evaluated:Patient Re-evaluated prior to induction Oxygen Delivery Method: Circle System Utilized Preoxygenation: Pre-oxygenation with 100% oxygen Induction Type: IV induction Ventilation: Mask ventilation without difficulty and Oral airway inserted - appropriate to patient size Laryngoscope Size: Glidescope and 4 Grade View: Grade I Tube type: Oral Tube size: 7.5 mm Number of attempts: 1 Airway Equipment and Method: Stylet and Oral airway Placement Confirmation: ETT inserted through vocal cords under direct vision, positive ETCO2 and breath sounds checked- equal and bilateral Tube secured with: Tape Dental Injury: Teeth and Oropharynx as per pre-operative assessment

## 2021-11-29 NOTE — Anesthesia Postprocedure Evaluation (Signed)
Anesthesia Post Note  Patient: [name]  Procedure(s) Performed: ENDOSCOPIC RETROGRADE CHOLANGIOPANCREATOGRAPHY (ERCP) WITH PROPOFOL STENT REMOVAL REMOVAL OF SLUDGE     Patient location during evaluation: PACU Anesthesia Type: General Level of consciousness: awake and alert Pain management: pain level controlled Vital Signs Assessment: post-procedure vital signs reviewed and stable Respiratory status: spontaneous breathing, nonlabored ventilation, respiratory function stable and patient connected to nasal cannula oxygen Cardiovascular status: blood pressure returned to baseline and stable Postop Assessment: no apparent nausea or vomiting Anesthetic complications: no   No notable events documented.  Last Vitals:  Vitals:   11/29/21 0915 11/29/21 0916  BP: 131/68   Pulse: 75 74  Resp: 16 14  Temp:  (!) 36.1 C  SpO2: 92% 93%    Last Pain:  Vitals:   11/29/21 0916  TempSrc:   PainSc: 0-No pain                 Dua Mehler

## 2021-11-29 NOTE — Op Note (Addendum)
[facility] Patient Name: [name] Procedure Date : 11/29/2021 MRN: [id number] Attending MD: Justice Britain , MD Date of Birth: [birth year] CSN: [id number] Age: 76 Admit Type: Outpatient Procedure:                ERCP Indications:              Biliary stent removal, Prior endoscopic                            papillectomy, Prior Endoscopic Retrograde                            Cholangiopancreatography Providers:                Justice Britain, MD, Burtis Junes, RN, Luan Moore, Technician, Virgilio Belling. Huel Cote, CRNA Referring MD:             [name], MD, Biagio Borg, MD Medicines:                General Anesthesia, Cipro 400 mg IV, Glucagon 0.25                            mg IV, Diclofenac 408 mg rectal Complications:            No immediate complications. Estimated Blood Loss:     Estimated blood loss was minimal. Procedure:                Pre-Anesthesia Assessment:                           - Prior to the procedure, a History and Physical                            was performed, and patient medications and                            allergies were reviewed. The patient's tolerance of                            previous anesthesia was also reviewed. The risks                            and benefits of the procedure and the sedation                            options and risks were discussed with the patient.                            All questions were answered, and informed consent                            was obtained. Prior Anticoagulants: The patient has  taken Eliquis (apixaban), last dose was 2 days                            prior to procedure. ASA Grade Assessment: III - A                            patient with severe systemic disease. After                            reviewing the risks and benefits, the patient was                            deemed in satisfactory condition to  undergo the                            procedure.                           After obtaining informed consent, the scope was                            passed under direct vision. Throughout the                            procedure, the patient's blood pressure, pulse, and                            oxygen saturations were monitored continuously. The                            [id number]-Q190V ([id number] Olympus duodenoscope was                            introduced through the mouth, and used to inject                            contrast into and used to inject contrast into the                            bile duct. The ERCP was accomplished without                            difficulty. The patient tolerated the procedure. Scope In: Scope Out: Findings:      A biliary stent was visible on the scout film.      The esophagus was successfully intubated under direct vision without       detailed examination of the pharynx, larynx, and associated structures,       and upper GI tract. One plastic biliary stent originating in the biliary       tree was emerging from the major papilla. One stent was removed from the       biliary tree using a snare. Inspection of the major papilla revealed       that biliary and pancreatic sphincterotomies had been performed  previously. The biliary sphincterotomy appeared open. The pancreatic       sphincterotomy appeared open. Inspection of the major papilla revealed       that an ampullectomy/papillectomy had been performed previously. Looking       at the area closely, there appeared to be a scar in the site of the       inferior resection and I could not visualize anything today that looked       like recurrence.      A short 0.035 inch Soft Jagwire was passed into the biliary tree. The       Hydratome sphincterotome was passed over the guidewire and the bile duct       was then deeply cannulated. Contrast was injected. I personally       interpreted the  bile duct images. Ductal flow of contrast was adequate.       Image quality was adequate. Contrast extended to the entire biliary       tree. Opacification of the entire biliary tree was successful. The       maximum diameter of the ducts was 7 mm. To discover objects, the biliary       tree was swept with a retrieval balloon starting at the bifurcation.       Sludge was swept from the duct. An occlusion cholangiogram was performed       that showed no further significant biliary pathology.      A pancreatogram was not performed.      The duodenoscope was withdrawn from the patient. Impression:               - One stent from the biliary tree was seen in the                            major papilla. This was removed.                           - Prior biliary sphincterotomy appeared open.                           - Prior pancreatic sphincterotomy appeared open.                           - Prior endoscopic papillectomy was present. I                            visualized a scar from inferior portion of                            papillectomy and did not see anything today that                            looked like recurrence of the prior adenoma.                           - One stent was removed from the biliary tree.                           - The biliary tree was swept and sludge was found.  Recommendation:           - The patient will be observed post-procedure,                            until all discharge criteria are met.                           - Discharge patient to home.                           - Patient has a contact number available for                            emergencies. The signs and symptoms of potential                            delayed complications were discussed with the                            patient. Return to normal activities tomorrow.                            Written discharge instructions were provided to the                            patient.                            - Low fat diet for 1 week.                           - Observe patient's clinical course.                           - May restart all medications today (including his                            anticoagulant this evening).                           - Repeat EGD with side-viewing endoscope in 1 year                            for surveillance. Risk of recurrence now should be                            low but still possible.                           - Watch for pancreatitis, bleeding, perforation,                            and cholangitis.                           - The findings and recommendations  were discussed                            with the patient.                           - The findings and recommendations were discussed                            with the patient's family. Procedure Code(s):        --- Professional ---                           [phone], Endoscopic retrograde                            cholangiopancreatography (ERCP); with removal of                            foreign body(s) or stent(s) from biliary/pancreatic                            duct(s)                           43264, Endoscopic retrograde                            cholangiopancreatography (ERCP); with removal of                            calculi/debris from biliary/pancreatic duct(s) Diagnosis Code(s):        --- Professional ---                           Z96.89, Presence of other specified functional                            implants                           Z46.59, Encounter for fitting and adjustment of                            other gastrointestinal appliance and device                           Z98.890, Other specified postprocedural states CPT copyright 2019 [employer]. All rights reserved. The codes documented in this report are preliminary and upon coder review may  be revised to meet current compliance requirements. Justice Britain,  MD 11/29/2021 9:05:44 AM Number of Addenda: 0

## 2021-11-29 NOTE — Transfer of Care (Signed)
Immediate Anesthesia Transfer of Care Note  Patient: [name]  Procedure(s) Performed: ENDOSCOPIC RETROGRADE CHOLANGIOPANCREATOGRAPHY (ERCP) WITH PROPOFOL STENT REMOVAL REMOVAL OF SLUDGE  Patient Location: PACU  Anesthesia Type:General  Level of Consciousness: awake, alert  and oriented  Airway & Oxygen Therapy: Patient Spontanous Breathing and Patient connected to nasal cannula oxygen  Post-op Assessment: Report given to RN and Post -op Vital signs reviewed and stable  Post vital signs: Reviewed and stable  Last Vitals:  Vitals Value Taken Time  BP 144/78 11/29/21 0850  Temp    Pulse 77 11/29/21 0853  Resp 18 11/29/21 0853  SpO2 94 % 11/29/21 0853  Vitals shown include unvalidated device data.  Last Pain:  Vitals:   11/29/21 0700  TempSrc: Temporal  PainSc: 3          Complications: No notable events documented.

## 2021-11-30 ENCOUNTER — Encounter (HOSPITAL_COMMUNITY): Payer: Self-pay | Admitting: Gastroenterology

## 2021-12-04 ENCOUNTER — Other Ambulatory Visit: Payer: Self-pay | Admitting: Physician Assistant

## 2021-12-04 DIAGNOSIS — M1380 Other specified arthritis, unspecified site: Secondary | ICD-10-CM

## 2021-12-04 DIAGNOSIS — M1990 Unspecified osteoarthritis, unspecified site: Secondary | ICD-10-CM

## 2021-12-04 NOTE — Telephone Encounter (Signed)
Next Visit: 02/19/2022   Last Visit: 09/18/2021  Labs: 11/26/2021 Glucose 145  Eye exam: 03/14/2021 normal.    Current Dose per office note 09/18/2021: PLQ 200 mg 1 tablet po Monday to Friday  UV:[id number] arthritis involving multiple sites with positive rheumatoid factor   Last Fill: 09/05/2021  Okay to refill Plaquenil?

## 2021-12-08 ENCOUNTER — Other Ambulatory Visit: Payer: Self-pay | Admitting: Rheumatology

## 2021-12-09 NOTE — Anesthesia Preprocedure Evaluation (Addendum)
Anesthesia Evaluation  Patient identified by MRN, date of birth, ID band Patient awake    Reviewed: Allergy & Precautions, NPO status , Patient's Chart, lab work & pertinent test results  History of Anesthesia Complications Negative for: history of anesthetic complications  Airway Mallampati: III  TM Distance: <3 FB Neck ROM: Full    Dental   Pulmonary sleep apnea and Continuous Positive Airway Pressure Ventilation , former smoker,    Pulmonary exam normal        Cardiovascular hypertension, Pt. on home beta blockers and Pt. on medications + CAD, + Cardiac Stents and +CHF  Normal cardiovascular exam+ dysrhythmias Atrial Fibrillation + Cardiac Defibrillator    Echo 06/22/20: EF 50-55%, no RWMA, mod LVH, normal RVSF, mild MR, no AR/AS  Cath 2020: nonobstructive CAD, patent RCA stent, good LV systolic function, normal LVEDP   Neuro/Psych negative neurological ROS     GI/Hepatic Neg liver ROS, hiatal hernia, GERD  ,  Endo/Other  negative endocrine ROS  Renal/GU negative Renal ROS  negative genitourinary   Musculoskeletal  (+) Arthritis , Osteoarthritis,    Abdominal   Peds  Hematology negative hematology ROS (+)   Anesthesia Other Findings  Plts 242 ELIQUIS  Reproductive/Obstetrics                            Anesthesia Physical Anesthesia Plan  ASA: 3  Anesthesia Plan: Spinal   Post-op Pain Management: Tylenol PO (pre-op)* and Toradol IV (intra-op)*   Induction:   PONV Risk Score and Plan: 1 and Propofol infusion, Treatment may vary due to age or medical condition, Ondansetron and TIVA  Airway Management Planned: Nasal Cannula and Simple Face Mask  Additional Equipment: None  Intra-op Plan:   Post-operative Plan:   Informed Consent: I have reviewed the patients History and Physical, chart, labs and discussed the procedure including the risks, benefits and alternatives for the  proposed anesthesia with the patient or authorized representative who has indicated his/her understanding and acceptance.       Plan Discussed with:   Anesthesia Plan Comments: (Eliquis held >72 hours. OK for spinal. )       Anesthesia Quick Evaluation

## 2021-12-10 ENCOUNTER — Encounter (HOSPITAL_COMMUNITY)
Admission: RE | Disposition: A | Discharge status: Home / Self Care | Payer: Self-pay | Source: Ambulatory Visit | Attending: Orthopedic Surgery

## 2021-12-10 ENCOUNTER — Observation Stay (HOSPITAL_COMMUNITY)
Admission: RE | Admit: 2021-12-10 | Discharge: 2021-12-11 | Disposition: A | Discharge status: Home / Self Care | Payer: Medicare Other | Source: Ambulatory Visit | Attending: Orthopedic Surgery | Admitting: Orthopedic Surgery

## 2021-12-10 ENCOUNTER — Other Ambulatory Visit: Payer: Self-pay

## 2021-12-10 ENCOUNTER — Ambulatory Visit (HOSPITAL_COMMUNITY): Payer: Medicare Other | Admitting: Anesthesiology

## 2021-12-10 ENCOUNTER — Ambulatory Visit (HOSPITAL_BASED_OUTPATIENT_CLINIC_OR_DEPARTMENT_OTHER): Payer: Medicare Other | Admitting: Anesthesiology

## 2021-12-10 ENCOUNTER — Encounter (HOSPITAL_COMMUNITY): Payer: Self-pay | Admitting: Orthopedic Surgery

## 2021-12-10 DIAGNOSIS — Z955 Presence of coronary angioplasty implant and graft: Secondary | ICD-10-CM | POA: Insufficient documentation

## 2021-12-10 DIAGNOSIS — Z87891 Personal history of nicotine dependence: Secondary | ICD-10-CM | POA: Insufficient documentation

## 2021-12-10 DIAGNOSIS — M179 Osteoarthritis of knee, unspecified: Secondary | ICD-10-CM | POA: Diagnosis present

## 2021-12-10 DIAGNOSIS — Z79899 Other long term (current) drug therapy: Secondary | ICD-10-CM | POA: Insufficient documentation

## 2021-12-10 DIAGNOSIS — I5022 Chronic systolic (congestive) heart failure: Secondary | ICD-10-CM | POA: Insufficient documentation

## 2021-12-10 DIAGNOSIS — Z96651 Presence of right artificial knee joint: Secondary | ICD-10-CM | POA: Diagnosis not present

## 2021-12-10 DIAGNOSIS — M1712 Unilateral primary osteoarthritis, left knee: Secondary | ICD-10-CM

## 2021-12-10 DIAGNOSIS — M25562 Pain in left knee: Secondary | ICD-10-CM | POA: Insufficient documentation

## 2021-12-10 DIAGNOSIS — I2510 Atherosclerotic heart disease of native coronary artery without angina pectoris: Secondary | ICD-10-CM | POA: Insufficient documentation

## 2021-12-10 DIAGNOSIS — Z950 Presence of cardiac pacemaker: Secondary | ICD-10-CM | POA: Insufficient documentation

## 2021-12-10 DIAGNOSIS — I480 Paroxysmal atrial fibrillation: Secondary | ICD-10-CM | POA: Insufficient documentation

## 2021-12-10 DIAGNOSIS — I509 Heart failure, unspecified: Secondary | ICD-10-CM | POA: Diagnosis not present

## 2021-12-10 DIAGNOSIS — Z7901 Long term (current) use of anticoagulants: Secondary | ICD-10-CM | POA: Diagnosis not present

## 2021-12-10 DIAGNOSIS — Z85828 Personal history of other malignant neoplasm of skin: Secondary | ICD-10-CM | POA: Diagnosis not present

## 2021-12-10 DIAGNOSIS — G8918 Other acute postprocedural pain: Secondary | ICD-10-CM | POA: Diagnosis not present

## 2021-12-10 DIAGNOSIS — I110 Hypertensive heart disease with heart failure: Secondary | ICD-10-CM | POA: Insufficient documentation

## 2021-12-10 HISTORY — PX: TOTAL KNEE ARTHROPLASTY: SHX125

## 2021-12-10 SURGERY — ARTHROPLASTY, KNEE, TOTAL
Anesthesia: Spinal | Site: Knee | Laterality: Left

## 2021-12-10 MED ORDER — TRANEXAMIC ACID-NACL 1000-0.7 MG/100ML-% IV SOLN
INTRAVENOUS | Status: AC
  Filled 2021-12-10: qty 100

## 2021-12-10 MED ORDER — NITROGLYCERIN 0.4 MG SL SUBL
0.4000 mg | SUBLINGUAL_TABLET | SUBLINGUAL | Status: DC | PRN
Start: 2021-12-10 — End: 2021-12-11

## 2021-12-10 MED ORDER — FENTANYL CITRATE (PF) 100 MCG/2ML IJ SOLN
INTRAMUSCULAR | Status: DC | PRN
  Administered 2021-12-10 (×2): 50 ug via INTRAVENOUS

## 2021-12-10 MED ORDER — METOCLOPRAMIDE HCL 5 MG/ML IJ SOLN: 5.0000 mg | Freq: Three times a day (TID) | INTRAMUSCULAR | Status: DC | PRN

## 2021-12-10 MED ORDER — MENTHOL 3 MG MT LOZG: 1.0000 | LOZENGE | OROMUCOSAL | Status: DC | PRN

## 2021-12-10 MED ORDER — ACETAMINOPHEN 10 MG/ML IV SOLN: 1000.0000 mg | Freq: Four times a day (QID) | INTRAVENOUS | Status: DC

## 2021-12-10 MED ORDER — PHENYLEPHRINE HCL-NACL 20-0.9 MG/250ML-% IV SOLN
INTRAVENOUS | Status: AC
  Filled 2021-12-10: qty 250

## 2021-12-10 MED ORDER — CEFAZOLIN SODIUM-DEXTROSE 2-4 GM/100ML-% IV SOLN
2.0000 g | INTRAVENOUS | Status: AC
  Administered 2021-12-10: 2 g via INTRAVENOUS

## 2021-12-10 MED ORDER — ONDANSETRON HCL 4 MG/2ML IJ SOLN
INTRAMUSCULAR | Status: AC
  Filled 2021-12-10: qty 2

## 2021-12-10 MED ORDER — CEFAZOLIN SODIUM-DEXTROSE 2-4 GM/100ML-% IV SOLN
INTRAVENOUS | Status: AC
  Filled 2021-12-10: qty 100

## 2021-12-10 MED ORDER — KETOROLAC TROMETHAMINE 30 MG/ML IJ SOLN
INTRAMUSCULAR | Status: AC
  Filled 2021-12-10: qty 1

## 2021-12-10 MED ORDER — SODIUM CHLORIDE 0.9 % IR SOLN
Status: DC | PRN
  Administered 2021-12-10: 1000 mL

## 2021-12-10 MED ORDER — BISACODYL 10 MG RE SUPP: 10.0000 mg | Freq: Every day | RECTAL | Status: DC | PRN

## 2021-12-10 MED ORDER — MIDAZOLAM HCL 5 MG/5ML IJ SOLN
INTRAMUSCULAR | Status: DC | PRN
  Administered 2021-12-10: 2 mg via INTRAVENOUS

## 2021-12-10 MED ORDER — DEXAMETHASONE SODIUM PHOSPHATE 10 MG/ML IJ SOLN
INTRAMUSCULAR | Status: AC
  Filled 2021-12-10: qty 1

## 2021-12-10 MED ORDER — ONDANSETRON HCL 4 MG/2ML IJ SOLN: 4.0000 mg | Freq: Once | INTRAMUSCULAR | Status: DC | PRN

## 2021-12-10 MED ORDER — SODIUM CHLORIDE (PF) 0.9 % IJ SOLN
INTRAMUSCULAR | Status: AC
  Filled 2021-12-10: qty 50

## 2021-12-10 MED ORDER — FENTANYL CITRATE PF 50 MCG/ML IJ SOSY: 25.0000 ug | PREFILLED_SYRINGE | INTRAMUSCULAR | Status: DC | PRN

## 2021-12-10 MED ORDER — ACETAMINOPHEN 10 MG/ML IV SOLN
INTRAVENOUS | Status: AC
  Filled 2021-12-10: qty 100

## 2021-12-10 MED ORDER — KETOROLAC TROMETHAMINE 30 MG/ML IJ SOLN
INTRAMUSCULAR | Status: DC | PRN
  Administered 2021-12-10: 15 mg via INTRAVENOUS

## 2021-12-10 MED ORDER — TAMSULOSIN HCL 0.4 MG PO CAPS
0.4000 mg | ORAL_CAPSULE | Freq: Every day | ORAL | Status: DC
  Administered 2021-12-11: 0.4 mg via ORAL
  Filled 2021-12-10: qty 1

## 2021-12-10 MED ORDER — ONDANSETRON HCL 4 MG/2ML IJ SOLN: 4.0000 mg | Freq: Four times a day (QID) | INTRAMUSCULAR | Status: DC | PRN

## 2021-12-10 MED ORDER — CARVEDILOL 25 MG PO TABS
25.0000 mg | ORAL_TABLET | Freq: Two times a day (BID) | ORAL | Status: DC
  Administered 2021-12-10 – 2021-12-11 (×2): 25 mg via ORAL
  Filled 2021-12-10 (×2): qty 1

## 2021-12-10 MED ORDER — METHOCARBAMOL 500 MG PO TABS
500.0000 mg | ORAL_TABLET | Freq: Four times a day (QID) | ORAL | Status: DC | PRN
  Administered 2021-12-10 – 2021-12-11 (×2): 500 mg via ORAL
  Filled 2021-12-10 (×2): qty 1

## 2021-12-10 MED ORDER — MORPHINE SULFATE (PF) 2 MG/ML IV SOLN: 1.0000 mg | INTRAVENOUS | Status: DC | PRN

## 2021-12-10 MED ORDER — LACTATED RINGERS IV SOLN: INTRAVENOUS | Status: DC

## 2021-12-10 MED ORDER — NABUMETONE 500 MG PO TABS
500.0000 mg | ORAL_TABLET | Freq: Every day | ORAL | Status: DC
  Administered 2021-12-11: 500 mg via ORAL
  Filled 2021-12-10: qty 1

## 2021-12-10 MED ORDER — AMISULPRIDE (ANTIEMETIC) 5 MG/2ML IV SOLN: 10.0000 mg | Freq: Once | INTRAVENOUS | Status: DC | PRN

## 2021-12-10 MED ORDER — OXYCODONE HCL 5 MG PO TABS: 5.0000 mg | ORAL_TABLET | Freq: Once | ORAL | Status: DC | PRN

## 2021-12-10 MED ORDER — PHENOL 1.4 % MT LIQD: 1.0000 | OROMUCOSAL | Status: DC | PRN

## 2021-12-10 MED ORDER — METHOCARBAMOL 500 MG IVPB - SIMPLE MED: 500.0000 mg | Freq: Four times a day (QID) | INTRAVENOUS | Status: DC | PRN

## 2021-12-10 MED ORDER — ONDANSETRON HCL 4 MG/2ML IJ SOLN
INTRAMUSCULAR | Status: DC | PRN
  Administered 2021-12-10: 4 mg via INTRAVENOUS

## 2021-12-10 MED ORDER — SODIUM CHLORIDE (PF) 0.9 % IJ SOLN
INTRAMUSCULAR | Status: DC | PRN
  Administered 2021-12-10: 60 mL

## 2021-12-10 MED ORDER — ACETAMINOPHEN 500 MG PO TABS: 1000.0000 mg | ORAL_TABLET | Freq: Once | ORAL | Status: DC

## 2021-12-10 MED ORDER — CEFAZOLIN SODIUM-DEXTROSE 2-4 GM/100ML-% IV SOLN
2.0000 g | Freq: Four times a day (QID) | INTRAVENOUS | Status: AC
  Administered 2021-12-10 (×2): 2 g via INTRAVENOUS
  Filled 2021-12-10 (×2): qty 100

## 2021-12-10 MED ORDER — CYCLOSPORINE 0.05 % OP EMUL: 1.0000 [drp] | Freq: Two times a day (BID) | OPHTHALMIC | Status: DC | PRN

## 2021-12-10 MED ORDER — TRAMADOL HCL 50 MG PO TABS: 50.0000 mg | ORAL_TABLET | Freq: Four times a day (QID) | ORAL | Status: DC | PRN

## 2021-12-10 MED ORDER — APIXABAN 2.5 MG PO TABS
2.5000 mg | ORAL_TABLET | Freq: Two times a day (BID) | ORAL | Status: DC
  Administered 2021-12-11: 2.5 mg via ORAL
  Filled 2021-12-10: qty 1

## 2021-12-10 MED ORDER — DEXAMETHASONE SODIUM PHOSPHATE 10 MG/ML IJ SOLN
8.0000 mg | Freq: Once | INTRAMUSCULAR | Status: AC
  Administered 2021-12-10: 8 mg via INTRAVENOUS

## 2021-12-10 MED ORDER — OXYCODONE HCL 5 MG/5ML PO SOLN: 5.0000 mg | Freq: Once | ORAL | Status: DC | PRN

## 2021-12-10 MED ORDER — FENTANYL CITRATE (PF) 100 MCG/2ML IJ SOLN
INTRAMUSCULAR | Status: AC
  Filled 2021-12-10: qty 2

## 2021-12-10 MED ORDER — ROPIVACAINE HCL 5 MG/ML IJ SOLN
INTRAMUSCULAR | Status: DC | PRN
  Administered 2021-12-10: 30 mL via PERINEURAL

## 2021-12-10 MED ORDER — SACUBITRIL-VALSARTAN 49-51 MG PO TABS
1.0000 | ORAL_TABLET | Freq: Two times a day (BID) | ORAL | Status: DC
Start: 2021-12-11 — End: 2021-12-11
  Administered 2021-12-11: 1 via ORAL
  Filled 2021-12-10: qty 1

## 2021-12-10 MED ORDER — DOCUSATE SODIUM 100 MG PO CAPS
100.0000 mg | ORAL_CAPSULE | Freq: Two times a day (BID) | ORAL | Status: DC
  Administered 2021-12-10 – 2021-12-11 (×2): 100 mg via ORAL
  Filled 2021-12-10 (×2): qty 1

## 2021-12-10 MED ORDER — PROPOFOL 1000 MG/100ML IV EMUL
INTRAVENOUS | Status: AC
  Filled 2021-12-10: qty 100

## 2021-12-10 MED ORDER — ORAL CARE MOUTH RINSE: 15.0000 mL | Freq: Once | OROMUCOSAL | Status: AC

## 2021-12-10 MED ORDER — SODIUM CHLORIDE 0.9 % IV SOLN: INTRAVENOUS | Status: DC

## 2021-12-10 MED ORDER — RIVAROXABAN 10 MG PO TABS: 10.0000 mg | ORAL_TABLET | Freq: Every day | ORAL | Status: DC

## 2021-12-10 MED ORDER — HYDROXYCHLOROQUINE SULFATE 200 MG PO TABS
200.0000 mg | ORAL_TABLET | Freq: Every day | ORAL | Status: DC
  Administered 2021-12-11: 200 mg via ORAL
  Filled 2021-12-10: qty 1

## 2021-12-10 MED ORDER — DIPHENHYDRAMINE HCL 12.5 MG/5ML PO ELIX: 12.5000 mg | ORAL_SOLUTION | ORAL | Status: DC | PRN

## 2021-12-10 MED ORDER — BUPIVACAINE LIPOSOME 1.3 % IJ SUSP
INTRAMUSCULAR | Status: DC | PRN
  Administered 2021-12-10: 20 mL

## 2021-12-10 MED ORDER — POLYETHYLENE GLYCOL 3350 17 G PO PACK: 17.0000 g | PACK | Freq: Every day | ORAL | Status: DC | PRN

## 2021-12-10 MED ORDER — 0.9 % SODIUM CHLORIDE (POUR BTL) OPTIME
TOPICAL | Status: DC | PRN
  Administered 2021-12-10: 1000 mL

## 2021-12-10 MED ORDER — ATORVASTATIN CALCIUM 20 MG PO TABS
20.0000 mg | ORAL_TABLET | Freq: Every day | ORAL | Status: DC
  Administered 2021-12-11: 20 mg via ORAL
  Filled 2021-12-10: qty 1

## 2021-12-10 MED ORDER — FLEET ENEMA 7-19 GM/118ML RE ENEM: 1.0000 | ENEMA | Freq: Once | RECTAL | Status: DC | PRN

## 2021-12-10 MED ORDER — PANTOPRAZOLE SODIUM 40 MG PO TBEC
40.0000 mg | DELAYED_RELEASE_TABLET | Freq: Every day | ORAL | Status: DC
  Administered 2021-12-11: 40 mg via ORAL
  Filled 2021-12-10: qty 1

## 2021-12-10 MED ORDER — BUPIVACAINE IN DEXTROSE 0.75-8.25 % IT SOLN
INTRATHECAL | Status: DC | PRN
  Administered 2021-12-10: 1.6 mL via INTRATHECAL

## 2021-12-10 MED ORDER — SUCRALFATE 1 G PO TABS: 1.0000 g | ORAL_TABLET | Freq: Four times a day (QID) | ORAL | Status: DC | PRN

## 2021-12-10 MED ORDER — ONDANSETRON HCL 4 MG PO TABS: 4.0000 mg | ORAL_TABLET | Freq: Four times a day (QID) | ORAL | Status: DC | PRN

## 2021-12-10 MED ORDER — CHLORHEXIDINE GLUCONATE 0.12 % MT SOLN
15.0000 mL | Freq: Once | OROMUCOSAL | Status: AC
  Administered 2021-12-10: 15 mL via OROMUCOSAL

## 2021-12-10 MED ORDER — BUPIVACAINE LIPOSOME 1.3 % IJ SUSP: 20.0000 mL | Freq: Once | INTRAMUSCULAR | Status: DC

## 2021-12-10 MED ORDER — POVIDONE-IODINE 10 % EX SWAB
1.0000 | Freq: Once | CUTANEOUS | Status: AC
  Administered 2021-12-10: 1 via TOPICAL

## 2021-12-10 MED ORDER — PROPOFOL 500 MG/50ML IV EMUL
INTRAVENOUS | Status: DC | PRN
  Administered 2021-12-10: 75 ug/kg/min via INTRAVENOUS

## 2021-12-10 MED ORDER — ACETAMINOPHEN 500 MG PO TABS
1000.0000 mg | ORAL_TABLET | Freq: Four times a day (QID) | ORAL | Status: AC
  Administered 2021-12-10 – 2021-12-11 (×4): 1000 mg via ORAL
  Filled 2021-12-10 (×4): qty 2

## 2021-12-10 MED ORDER — OXYCODONE HCL 5 MG PO TABS
5.0000 mg | ORAL_TABLET | ORAL | Status: DC | PRN
  Administered 2021-12-10 – 2021-12-11 (×4): 10 mg via ORAL
  Filled 2021-12-10 (×5): qty 2

## 2021-12-10 MED ORDER — METOCLOPRAMIDE HCL 5 MG PO TABS: 5.0000 mg | ORAL_TABLET | Freq: Three times a day (TID) | ORAL | Status: DC | PRN

## 2021-12-10 MED ORDER — TRANEXAMIC ACID-NACL 1000-0.7 MG/100ML-% IV SOLN
1000.0000 mg | INTRAVENOUS | Status: AC
  Administered 2021-12-10: 1000 mg via INTRAVENOUS

## 2021-12-10 MED ORDER — POVIDONE-IODINE 10 % EX SWAB: 2.0000 "application " | Freq: Once | CUTANEOUS | Status: DC

## 2021-12-10 MED ORDER — SODIUM CHLORIDE (PF) 0.9 % IJ SOLN
INTRAMUSCULAR | Status: AC
  Filled 2021-12-10: qty 10

## 2021-12-10 MED ORDER — MIDAZOLAM HCL 2 MG/2ML IJ SOLN
INTRAMUSCULAR | Status: AC
  Filled 2021-12-10: qty 2

## 2021-12-10 MED ORDER — BUPIVACAINE LIPOSOME 1.3 % IJ SUSP
INTRAMUSCULAR | Status: AC
  Filled 2021-12-10: qty 20

## 2021-12-10 MED ORDER — SPIRONOLACTONE 12.5 MG HALF TABLET
12.5000 mg | ORAL_TABLET | Freq: Every day | ORAL | Status: DC
  Administered 2021-12-11: 12.5 mg via ORAL
  Filled 2021-12-10: qty 1

## 2021-12-10 MED ORDER — DEXAMETHASONE SODIUM PHOSPHATE 10 MG/ML IJ SOLN
10.0000 mg | Freq: Once | INTRAMUSCULAR | Status: AC
  Administered 2021-12-11: 10 mg via INTRAVENOUS
  Filled 2021-12-10: qty 1

## 2021-12-10 SURGICAL SUPPLY — 58 items
ATTUNE MED DOME PAT 38 KNEE (Knees) ×1 IMPLANT
ATTUNE PSFEM LTSZ6 NARCEM KNEE (Femur) ×1 IMPLANT
ATTUNE PSRP INSR SZ6 10 KNEE (Insert) ×1 IMPLANT
BAG COUNTER SPONGE SURGICOUNT (BAG) IMPLANT
BAG SPEC THK2 15X12 ZIP CLS (MISCELLANEOUS) ×1
BAG SPNG CNTER NS LX DISP (BAG)
BAG ZIPLOCK 12X15 (MISCELLANEOUS) ×2 IMPLANT
BASE TIBIAL ROT PLAT SZ 5 KNEE (Knees) IMPLANT
BLADE SAG 18X100X1.27 (BLADE) ×2 IMPLANT
BLADE SAW SGTL 11.0X1.19X90.0M (BLADE) ×2 IMPLANT
BNDG ELASTIC 6X5.8 VLCR STR LF (GAUZE/BANDAGES/DRESSINGS) ×2 IMPLANT
BOWL SMART MIX CTS (DISPOSABLE) ×2 IMPLANT
BSPLAT TIB 5 CMNT ROT PLAT STR (Knees) ×1 IMPLANT
CEMENT HV SMART SET (Cement) ×4 IMPLANT
COVER SURGICAL LIGHT HANDLE (MISCELLANEOUS) ×2 IMPLANT
CUFF TOURN SGL QUICK 34 (TOURNIQUET CUFF) ×2
CUFF TRNQT CYL 34X4.125X (TOURNIQUET CUFF) ×1 IMPLANT
DRAPE INCISE IOBAN 66X45 STRL (DRAPES) ×2 IMPLANT
DRAPE U-SHAPE 47X51 STRL (DRAPES) ×2 IMPLANT
DRSG AQUACEL AG ADV 3.5X10 (GAUZE/BANDAGES/DRESSINGS) ×2 IMPLANT
DURAPREP 26ML APPLICATOR (WOUND CARE) ×2 IMPLANT
ELECT REM PT RETURN 15FT ADLT (MISCELLANEOUS) ×2 IMPLANT
GLOVE BIO SURGEON STRL SZ 6.5 (GLOVE) IMPLANT
GLOVE BIO SURGEON STRL SZ7.5 (GLOVE) IMPLANT
GLOVE BIO SURGEON STRL SZ8 (GLOVE) ×2 IMPLANT
GLOVE BIOGEL PI IND STRL 6.5 (GLOVE) IMPLANT
GLOVE BIOGEL PI IND STRL 7.0 (GLOVE) IMPLANT
GLOVE BIOGEL PI IND STRL 8 (GLOVE) ×1 IMPLANT
GLOVE BIOGEL PI INDICATOR 6.5 (GLOVE)
GLOVE BIOGEL PI INDICATOR 7.0 (GLOVE)
GLOVE BIOGEL PI INDICATOR 8 (GLOVE) ×1
GOWN STRL REUS W/ TWL LRG LVL3 (GOWN DISPOSABLE) ×1 IMPLANT
GOWN STRL REUS W/ TWL XL LVL3 (GOWN DISPOSABLE) IMPLANT
GOWN STRL REUS W/TWL LRG LVL3 (GOWN DISPOSABLE) ×2
GOWN STRL REUS W/TWL XL LVL3 (GOWN DISPOSABLE)
HANDPIECE INTERPULSE COAX TIP (DISPOSABLE) ×2
HOLDER FOLEY CATH W/STRAP (MISCELLANEOUS) IMPLANT
IMMOBILIZER KNEE 20 (SOFTGOODS) ×2
IMMOBILIZER KNEE 20 THIGH 36 (SOFTGOODS) ×1 IMPLANT
KIT TURNOVER KIT A (KITS) IMPLANT
MANIFOLD NEPTUNE II (INSTRUMENTS) ×2 IMPLANT
NS IRRIG 1000ML POUR BTL (IV SOLUTION) ×2 IMPLANT
PACK TOTAL KNEE CUSTOM (KITS) ×2 IMPLANT
PADDING CAST COTTON 6X4 STRL (CAST SUPPLIES) ×3 IMPLANT
PROTECTOR NERVE ULNAR (MISCELLANEOUS) ×2 IMPLANT
SET HNDPC FAN SPRY TIP SCT (DISPOSABLE) ×1 IMPLANT
SPIKE FLUID TRANSFER (MISCELLANEOUS) ×2 IMPLANT
STRIP CLOSURE SKIN 1/2X4 (GAUZE/BANDAGES/DRESSINGS) ×4 IMPLANT
SUT MNCRL AB 4-0 PS2 18 (SUTURE) ×2 IMPLANT
SUT STRATAFIX 0 PDS 27 VIOLET (SUTURE) ×2
SUT VIC AB 2-0 CT1 27 (SUTURE) ×6
SUT VIC AB 2-0 CT1 TAPERPNT 27 (SUTURE) ×3 IMPLANT
SUTURE STRATFX 0 PDS 27 VIOLET (SUTURE) ×1 IMPLANT
TIBIAL BASE ROT PLAT SZ 5 KNEE (Knees) ×2 IMPLANT
TRAY FOLEY MTR SLVR 16FR STAT (SET/KITS/TRAYS/PACK) ×2 IMPLANT
TUBE SUCTION HIGH CAP CLEAR NV (SUCTIONS) ×2 IMPLANT
WATER STERILE IRR 1000ML POUR (IV SOLUTION) ×4 IMPLANT
WRAP KNEE MAXI GEL POST OP (GAUZE/BANDAGES/DRESSINGS) ×2 IMPLANT

## 2021-12-10 NOTE — Evaluation (Signed)
Physical Therapy Evaluation Patient Details Name: [name] MRN: [id number] DOB: 1946-05-09 Today's Date: 12/10/2021  History of Present Illness  Patient is 76 y.o. male s/p Lt TKA on 12/10/21 [place] significant for AICD, angina, CAD, CHF, back pain, dizziness, RBBB, GERD, HTN, HLD, OA, OSA, RCA stent in 2009, Rt TKA in 2017.    Clinical Impression  [name] is a 76 y.o. male POD 0 s/p Lt TKA. Patient reports independence with mobility at baseline. Patient is now limited by functional impairments (see PT problem list below) and requires min assist for transfers and gait with RW. Patient was able to ambulate short distance of ~4 feet with RW and min assist, limited by pain and dizziness/nausea. Patient instructed in exercise to facilitate ROM and circulation to manage edema. Patient will benefit from continued skilled PT interventions to address impairments and progress towards PLOF. Acute PT will follow to progress mobility and stair training in preparation for safe discharge home.      Recommendations for follow up therapy are one component of a multi-disciplinary discharge planning process, led by the attending physician.  Recommendations may be updated based on patient status, additional functional criteria and insurance authorization.  Follow Up Recommendations Follow physician's recommendations for discharge plan and follow up therapies      Assistance Recommended at Discharge Intermittent Supervision/Assistance  Patient can return home with the following  A little help with walking and/or transfers;A little help with bathing/dressing/bathroom;Assistance with cooking/housework;Direct supervision/assist for medications management;Assist for transportation;Help with stairs or ramp for entrance    Equipment Recommendations None recommended by PT  Recommendations for Other Services       Functional Status Assessment Patient has had a recent decline in their functional status  and demonstrates the ability to make significant improvements in function in a reasonable and predictable amount of time.     Precautions / Restrictions Precautions Precautions: Fall Restrictions Weight Bearing Restrictions: No Other Position/Activity Restrictions: WBAT      Mobility  Bed Mobility Overal bed mobility: Needs Assistance Bed Mobility: Supine to Sit     Supine to sit: Min assist, HOB elevated     General bed mobility comments: Cues for hand placement on bed rail and assist to bring Lt LE off EOB 2/2 pain.    Transfers Overall transfer level: Needs assistance Equipment used: Rolling walker (2 wheels) Transfers: Sit to/from Stand Sit to Stand: Min assist           General transfer comment: Cues for hand placement on EOB, slightly elevated. Assist to initiate and steady balance with rise/hand transition.    Ambulation/Gait Ambulation/Gait assistance: Min assist Gait Distance (Feet): 4 Feet Assistive device: Rolling walker (2 wheels) Gait Pattern/deviations: Step-to pattern, Decreased stride length, Decreased weight shift to left, Shuffle Gait velocity: decr     General Gait Details: cues for proximity to RW, assist to steady and block Lt knee to maintain stability. Pt limited by pain and nausea (suspect secondary to pain) and distance limited.  Stairs            Wheelchair Mobility    Modified Rankin (Stroke Patients Only)       Balance Overall balance assessment: Needs assistance Sitting-balance support: Feet supported Sitting balance-Leahy Scale: Good     Standing balance support: Reliant on assistive device for balance, During functional activity, Bilateral upper extremity supported Standing balance-Leahy Scale: Good  Pertinent Vitals/Pain Pain Assessment Pain Assessment: 0-10 Pain Score: 5  Pain Location: Lt knee Pain Descriptors / Indicators: Aching, Discomfort, Burning Pain  Intervention(s): Limited activity within patient's tolerance, Monitored during session, Repositioned    Home Living Family/patient expects to be discharged to:: Private residence Living Arrangements: Spouse/significant other Available Help at Discharge: Family Type of Home: House Home Access: Level entry     Alternate Level Stairs-Number of Steps: pt stays on main level Home Layout: Two level;Able to live on main level with bedroom/bathroom Home Equipment: Shower seat - built in;Hand held Engineering geologist (2 wheels);Cane - single point;Rollator (4 wheels);Toilet riser (ice machine)      Prior Function Prior Level of Function : Independent/Modified Independent                     Hand Dominance   Dominant Hand: Right    Extremity/Trunk Assessment   Upper Extremity Assessment Upper Extremity Assessment: Overall WFL for tasks assessed    Lower Extremity Assessment Lower Extremity Assessment: LLE deficits/detail LLE Deficits / Details: limited by pain but good quad activation. no extensor lag but height limited by pain. LLE Sensation: WNL LLE Coordination: WNL    Cervical / Trunk Assessment Cervical / Trunk Assessment: Normal  Communication   Communication: No difficulties  Cognition Arousal/Alertness: Awake/alert Behavior During Therapy: WFL for tasks assessed/performed Overall Cognitive Status: Within Functional Limits for tasks assessed                                          General Comments      Exercises Total Joint Exercises Ankle Circles/Pumps: AROM, Both, 20 reps Quad Sets: AROM, Left, 10 reps Heel Slides: AAROM, Left, 5 reps   Assessment/Plan    PT Assessment Patient needs continued PT services  PT Problem List Decreased strength;Decreased range of motion;Decreased activity tolerance;Decreased balance;Decreased mobility;Decreased knowledge of use of DME;Decreased knowledge of precautions;Pain       PT Treatment  Interventions DME instruction;Gait training;Stair training;Functional mobility training;Therapeutic activities;Therapeutic exercise;Neuromuscular re-education;Balance training;Patient/family education    PT Goals (Current goals can be found in the Care Plan section)  Acute Rehab PT Goals Patient Stated Goal: stop hurting PT Goal Formulation: With patient Time For Goal Achievement: 12/17/21 Potential to Achieve Goals: Good    Frequency 7X/week     Co-evaluation               AM-PAC PT "6 Clicks" Mobility  Outcome Measure Help needed turning from your back to your side while in a flat bed without using bedrails?: A Little Help needed moving from lying on your back to sitting on the side of a flat bed without using bedrails?: A Little Help needed moving to and from a bed to a chair (including a wheelchair)?: A Little Help needed standing up from a chair using your arms (e.g., wheelchair or bedside chair)?: A Little Help needed to walk in hospital room?: A Little Help needed climbing 3-5 steps with a railing? : A Little 6 Click Score: 18    End of Session Equipment Utilized During Treatment: Gait belt Activity Tolerance: Patient tolerated treatment well Patient left: with call bell/phone within reach Nurse Communication: Mobility status PT Visit Diagnosis: Muscle weakness (generalized) (M62.81);Difficulty in walking, not elsewhere classified (R26.2)    Time: 1337-1400 PT Time Calculation (min) (ACUTE ONLY): 23 min   Charges:   PT  Evaluation $PT Eval Low Complexity: 1 Low PT Treatments $Therapeutic Exercise: 8-22 mins        Verner Mould, DPT Acute Rehabilitation Services Office [phone] Pager [phone]  12/10/21 3:21 PM

## 2021-12-10 NOTE — Anesthesia Postprocedure Evaluation (Signed)
Anesthesia Post Note  Patient: [name]  Procedure(s) Performed: TOTAL KNEE ARTHROPLASTY (Left: Knee)     Patient location during evaluation: PACU Anesthesia Type: Spinal Level of consciousness: oriented and awake and alert Pain management: pain level controlled Vital Signs Assessment: post-procedure vital signs reviewed and stable Respiratory status: spontaneous breathing, respiratory function stable and nonlabored ventilation Cardiovascular status: blood pressure returned to baseline and stable Postop Assessment: no headache, no backache, no apparent nausea or vomiting and spinal receding Anesthetic complications: no   No notable events documented.  Last Vitals:  Vitals:   12/10/21 1115 12/10/21 1305  BP:  (!) 117/55  Pulse:  80  Resp: 18 17  Temp:  36.6 C  SpO2:  93%    Last Pain:  Vitals:   12/10/21 1515  TempSrc:   PainSc: 0-No pain                 Lidia Collum

## 2021-12-10 NOTE — Progress Notes (Signed)
Pt set up on CPAP. PT resting comfortable.

## 2021-12-10 NOTE — Op Note (Signed)
OPERATIVE REPORT-TOTAL KNEE ARTHROPLASTY   Pre-operative diagnosis- Osteoarthritis  Left knee(s)  Post-operative diagnosis- Osteoarthritis Left knee(s)  Procedure-  Left  Total Knee Arthroplasty  Surgeon- Dione Plover. Korena Nass, MD  Assistant- Jaynie Bream, PA-C   Anesthesia-   Adductor canal block and spinal  EBL-50 mL   Drains None  Tourniquet time- 32 minutes @ 024 mm Hg  Complications- None  Condition-PACU - hemodynamically stable.   Brief Clinical Note   [name] is a 76 y.o. 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 He has constant pain, with activity and at rest and significant functional deficits with difficulties even with ADLs. He has had extensive non-op management including analgesics, injections of cortisone and viscosupplements, and home exercise program, but remains in significant pain with significant dysfunction. Radiographs show bone on bone arthritis medial and patellofemoral. He presents now for left Total Knee Arthroplasty.     Procedure in detail---   The patient is brought into the operating room and positioned supine on the operating table. After successful administration of  Adductor canal block and spinal,   a tourniquet is placed high on the  Left thigh(s) and the lower extremity is prepped and draped in the usual sterile fashion. Time out is performed by the operating team and then the  Left lower extremity is wrapped in Esmarch, knee flexed and the tourniquet inflated to 300 mmHg.       A midline incision is made with a ten blade through the subcutaneous tissue to the level of the extensor mechanism. A fresh blade is used to make a medial parapatellar arthrotomy. Soft tissue over the proximal medial tibia is subperiosteally elevated to the joint line with a knife and into the semimembranosus bursa with a Cobb elevator. Soft tissue over the proximal lateral tibia is elevated with attention being  paid to avoiding the patellar tendon on the tibial tubercle. The patella is everted, knee flexed 90 degrees and the ACL and PCL are removed. Findings are bone on bone medial and patellofemoral with large globalosteophytes        The drill is used to create a starting hole in the distal femur and the canal is thoroughly irrigated with sterile saline to remove the fatty contents. The 5 degree Left  valgus alignment guide is placed into the femoral canal and the distal femoral cutting block is pinned to remove 9 mm off the distal femur. Resection is made with an oscillating saw.      The tibia is subluxed forward and the menisci are removed. The extramedullary alignment guide is placed referencing proximally at the medial aspect of the tibial tubercle and distally along the second metatarsal axis and tibial crest. The block is pinned to remove 53m off the more deficient medial  side. Resection is made with an oscillating saw. Size 5is the most appropriate size for the tibia and the proximal tibia is prepared with the modular drill and keel punch for that size.      The femoral sizing guide is placed and size 6 is most appropriate. Rotation is marked off the epicondylar axis and confirmed by creating a rectangular flexion gap at 90 degrees. The size 6 cutting block is pinned in this rotation and the anterior, posterior and chamfer cuts are made with the oscillating saw. The intercondylar block is then placed and that cut is made.      Trial size 5 tibial component, trial size  6 narrow posterior stabilized femur and a 10  mm posterior stabilized rotating platform insert trial is placed. Full extension is achieved with excellent varus/valgus and anterior/posterior balance throughout full range of motion. The patella is everted and thickness measured to be 22  mm. Free hand resection is taken to 12 mm, a 38 template is placed, lug holes are drilled, trial patella is placed, and it tracks normally. Osteophytes are  removed off the posterior femur with the trial in place. All trials are removed and the cut bone surfaces prepared with pulsatile lavage. Cement is mixed and once ready for implantation, the size 5 tibial implant, size  6 narrow posterior stabilized femoral component, and the size 38 patella are cemented in place and the patella is held with the clamp. The trial insert is placed and the knee held in full extension. The Exparel (20 ml mixed with 60 ml saline) is injected into the extensor mechanism, posterior capsule, medial and lateral gutters and subcutaneous tissues.  All extruded cement is removed and once the cement is hard the permanent 10 mm posterior stabilized rotating platform insert is placed into the tibial tray.      The wound is copiously irrigated with saline solution and the extensor mechanism closed with # 0 Stratofix suture. The tourniquet is released for a total tourniquet time of 32  minutes. Flexion against gravity is 140 degrees and the patella tracks normally. Subcutaneous tissue is closed with 2.0 vicryl and subcuticular with running 4.0 Monocryl. The incision is cleaned and dried and steri-strips and a bulky sterile dressing are applied. The limb is placed into a knee immobilizer and the patient is awakened and transported to recovery in stable condition.      Please note that a surgical assistant was a medical necessity for this procedure in order to perform it in a safe and expeditious manner. Surgical assistant was necessary to retract the ligaments and vital neurovascular structures to prevent injury to them and also necessary for proper positioning of the limb to allow for anatomic placement of the prosthesis.   Dione Plover Rechy Bost, MD    12/10/2021, 8:24 AM

## 2021-12-10 NOTE — Plan of Care (Signed)
  Problem: Pain Managment: Goal: General experience of comfort will improve Outcome: Progressing   Problem: Safety: Goal: Ability to remain free from injury will improve Outcome: Progressing   Problem: Activity: Goal: Risk for activity intolerance will decrease Outcome: Progressing   

## 2021-12-10 NOTE — Anesthesia Procedure Notes (Signed)
Spinal  Patient location during procedure: OR Reason for block: surgical anesthesia Staffing Performed: anesthesiologist  Anesthesiologist: Mikaiya Tramble E, MD Performed by: Maesyn Frisinger E, MD Authorized by: Laraya Pestka E, MD   Preanesthetic Checklist Completed: patient identified, IV checked, risks and benefits discussed, surgical consent, monitors and equipment checked, pre-op evaluation and timeout performed Spinal Block Patient position: sitting Prep: DuraPrep and site prepped and draped Patient monitoring: continuous pulse ox, blood pressure and heart rate Approach: midline Location: L3-4 Injection technique: single-shot Needle Needle type: Pencan  Needle gauge: 24 G Needle length: 10 cm Assessment Events: CSF return Additional Notes Functioning IV was confirmed and monitors were applied. Sterile prep and drape, including hand hygiene and sterile gloves were used. The patient was positioned and the spine was prepped. The skin was anesthetized with lidocaine.  Free flow of clear CSF was obtained prior to injecting local anesthetic into the CSF. The needle was carefully withdrawn. The patient tolerated the procedure well.      

## 2021-12-10 NOTE — Care Plan (Signed)
Ortho Bundle Case Management Note  Patient Details  Name: [name] MRN: [id number] Date of Birth: 1945/12/10  L TKA on 12-10-21 DCP:  Home with wife DME:  No needs, has a RW PT:  [facility] on 12-13-21.                   DME Arranged:  N/A DME Agency:  NA  HH Arranged:  NA HH Agency:  NA  Additional Comments: Please contact me with any questions of if this plan should need to change.  Marianne Sofia, RN,CCM [facility]  [phone] 12/10/2021, 3:41 PM

## 2021-12-10 NOTE — Telephone Encounter (Signed)
Next Visit: 02/19/2022   Last Visit: 09/18/2021   UDS:09/18/2021, UDS is c/w  Tramadol use.   Narc Agreement: 04/19/2021   Last Fill: 11/01/2021, Tramadol '50mg'$  by mouth at bedtime as needed.   Okay to refill Tramadol?

## 2021-12-10 NOTE — Transfer of Care (Signed)
Immediate Anesthesia Transfer of Care Note  Patient: [name]  Procedure(s) Performed: TOTAL KNEE ARTHROPLASTY (Left: Knee)  Patient Location: PACU  Anesthesia Type:Spinal  Level of Consciousness: awake  Airway & Oxygen Therapy: Patient Spontanous Breathing  Post-op Assessment: Report given to RN  Post vital signs: stable  Last Vitals:  Vitals Value Taken Time  BP 116/63 12/10/21 0851  Temp    Pulse 76 12/10/21 0852  Resp 16 12/10/21 0852  SpO2 93 % 12/10/21 0852  Vitals shown include unvalidated device data.  Last Pain:  Vitals:   12/10/21 0615  TempSrc: Oral  PainSc:          Complications: No notable events documented.

## 2021-12-10 NOTE — Discharge Instructions (Signed)
 Billy Aluisio, MD Total Joint Specialist [facility] [place] [address] [place], [place] [address] [phone]  TOTAL KNEE REPLACEMENT POSTOPERATIVE DIRECTIONS    Knee Rehabilitation, Guidelines Following Surgery  Results after knee surgery are often greatly improved when you follow the exercise, range of motion and muscle strengthening exercises prescribed by your doctor. Safety measures are also important to protect the knee from further injury. If any of these exercises cause you to have increased pain or swelling in your knee joint, decrease the amount until you are comfortable again and slowly increase them. If you have problems or questions, call your caregiver or physical therapist for advice.   HOME CARE INSTRUCTIONS  Remove items at home which could result in a fall. This includes throw rugs or furniture in walking pathways.  ICE to the affected knee as much as tolerated. Icing helps control swelling. If the swelling is well controlled you will be more comfortable and rehab easier. Continue to use ice on the knee for pain and swelling from surgery. You may notice swelling that will progress down to the foot and ankle. This is normal after surgery. Elevate the leg when you are not up walking on it.    Continue to use the breathing machine which will help keep your temperature down. It is common for your temperature to cycle up and down following surgery, especially at night when you are not up moving around and exerting yourself. The breathing machine keeps your lungs expanded and your temperature down. Do not place pillow under the operative knee, focus on keeping the knee straight while resting  DIET You may resume your previous home diet once you are discharged from the hospital.  DRESSING / WOUND CARE / SHOWERING Keep your bulky bandage on for 2 days. On the third post-operative day you may remove the Ace bandage and gauze. There is a waterproof  adhesive bandage on your skin which will stay in place until your first follow-up appointment. Once you remove this you will not need to place another bandage You may begin showering 3 days following surgery, but do not submerge the incision under water.  ACTIVITY For the first 5 days, the key is rest and control of pain and swelling Do your home exercises twice a day starting on post-operative day 3. On the days you go to physical therapy, just do the home exercises once that day. You should rest, ice and elevate the leg for 50 minutes out of every hour. Get up and walk/stretch for 10 minutes per hour. After 5 days you can increase your activity slowly as tolerated. Walk with your walker as instructed. Use the walker until you are comfortable transitioning to a cane. Walk with the cane in the opposite hand of the operative leg. You may discontinue the cane once you are comfortable and walking steadily. Avoid periods of inactivity such as sitting longer than an hour when not asleep. This helps prevent blood clots.  You may discontinue the knee immobilizer once you are able to perform a straight leg raise while lying down. You may resume a sexual relationship in one month or when given the OK by your doctor.  You may return to work once you are cleared by your doctor.  Do not drive a car for 6 weeks or until released by your surgeon.  Do not drive while taking narcotics.  TED HOSE STOCKINGS Wear the elastic stockings on both legs for three weeks following surgery during the   day. You may remove them at night for sleeping.  WEIGHT BEARING Weight bearing as tolerated with assist device (walker, cane, etc) as directed, use it as long as suggested by your surgeon or therapist, typically at least 4-6 weeks.  POSTOPERATIVE CONSTIPATION PROTOCOL Constipation - defined medically as fewer than three stools per week and severe constipation as less than one stool per week.  One of the most common issues  patients have following surgery is constipation.  Even if you have a regular bowel pattern at home, your normal regimen is likely to be disrupted due to multiple reasons following surgery.  Combination of anesthesia, postoperative narcotics, change in appetite and fluid intake all can affect your bowels.  In order to avoid complications following surgery, here are some recommendations in order to help you during your recovery period.  Colace (docusate) - Pick up an over-the-counter form of Colace or another stool softener and take twice a day as long as you are requiring postoperative pain medications.  Take with a full glass of water daily.  If you experience loose stools or diarrhea, hold the colace until you stool forms back up. If your symptoms do not get better within 1 week or if they get worse, check with your doctor. Dulcolax (bisacodyl) - Pick up over-the-counter and take as directed by the product packaging as needed to assist with the movement of your bowels.  Take with a full glass of water.  Use this product as needed if not relieved by Colace only.  MiraLax (polyethylene glycol) - Pick up over-the-counter to have on hand. MiraLax is a solution that will increase the amount of water in your bowels to assist with bowel movements.  Take as directed and can mix with a glass of water, juice, soda, coffee, or tea. Take if you go more than two days without a movement. Do not use MiraLax more than once per day. Call your doctor if you are still constipated or irregular after using this medication for 7 days in a row.  If you continue to have problems with postoperative constipation, please contact the office for further assistance and recommendations.  If you experience "the worst abdominal pain ever" or develop nausea or vomiting, please contact the office immediatly for further recommendations for treatment.  ITCHING If you experience itching with your medications, try taking only a single pain  pill, or even half a pain pill at a time.  You can also use Benadryl over the counter for itching or also to help with sleep.   MEDICATIONS See your medication summary on the "After Visit Summary" that the nursing staff will review with you prior to discharge.  You may have some home medications which will be placed on hold until you complete the course of blood thinner medication.  It is important for you to complete the blood thinner medication as prescribed by your surgeon.  Continue your approved medications as instructed at time of discharge.  PRECAUTIONS If you experience chest pain or shortness of breath - call 911 immediately for transfer to the hospital emergency department.  If you develop a fever greater that 101 F, purulent drainage from wound, increased redness or drainage from wound, foul odor from the wound/dressing, or calf pain - CONTACT YOUR SURGEON.                                                     FOLLOW-UP APPOINTMENTS Make sure you keep all of your appointments after your operation with your surgeon and caregivers. You should call the office at the above phone number and make an appointment for approximately two weeks after the date of your surgery or on the date instructed by your surgeon outlined in the "After Visit Summary".  RANGE OF MOTION AND STRENGTHENING EXERCISES  Rehabilitation of the knee is important following a knee injury or an operation. After just a few days of immobilization, the muscles of the thigh which control the knee become weakened and shrink (atrophy). Knee exercises are designed to build up the tone and strength of the thigh muscles and to improve knee motion. Often times heat used for twenty to thirty minutes before working out will loosen up your tissues and help with improving the range of motion but do not use heat for the first two weeks following surgery. These exercises can be done on a training (exercise) mat, on the floor, on a table or on a bed.  Use what ever works the best and is most comfortable for you Knee exercises include:  Leg Lifts - While your knee is still immobilized in a splint or cast, you can do straight leg raises. Lift the leg to 60 degrees, hold for 3 sec, and slowly lower the leg. Repeat 10-20 times 2-3 times daily. Perform this exercise against resistance later as your knee gets better.  Quad and Hamstring Sets - Tighten up the muscle on the front of the thigh (Quad) and hold for 5-10 sec. Repeat this 10-20 times hourly. Hamstring sets are done by pushing the foot backward against an object and holding for 5-10 sec. Repeat as with quad sets.  Leg Slides: Lying on your back, slowly slide your foot toward your buttocks, bending your knee up off the floor (only go as far as is comfortable). Then slowly slide your foot back down until your leg is flat on the floor again. Angel Wings: Lying on your back spread your legs to the side as far apart as you can without causing discomfort.  A rehabilitation program following serious knee injuries can speed recovery and prevent re-injury in the future due to weakened muscles. Contact your doctor or a physical therapist for more information on knee rehabilitation.   POST-OPERATIVE OPIOID TAPER INSTRUCTIONS: It is important to wean off of your opioid medication as soon as possible. If you do not need pain medication after your surgery it is ok to stop day one. Opioids include: Codeine, Hydrocodone(Norco, Vicodin), Oxycodone(Percocet, oxycontin) and hydromorphone amongst others.  Long term and even short term use of opiods can cause: Increased pain response Dependence Constipation Depression Respiratory depression And more.  Withdrawal symptoms can include Flu like symptoms Nausea, vomiting And more Techniques to manage these symptoms Hydrate well Eat regular healthy meals Stay active Use relaxation techniques(deep breathing, meditating, yoga) Do Not substitute Alcohol to help  with tapering If you have been on opioids for less than two weeks and do not have pain than it is ok to stop all together.  Plan to wean off of opioids This plan should start within one week post op of your joint replacement. Maintain the same interval or time between taking each dose and first decrease the dose.  Cut the total daily intake of opioids by one tablet each day Next start to increase the time between doses. The last dose that should be eliminated is the evening dose.   IF YOU ARE TRANSFERRED TO   A SKILLED REHAB FACILITY If the patient is transferred to a skilled rehab facility following release from the hospital, a list of the current medications will be sent to the facility for the patient to continue.  When discharged from the skilled rehab facility, please have the facility set up the patient's Home Health Physical Therapy prior to being released. Also, the skilled facility will be responsible for providing the patient with their medications at time of release from the facility to include their pain medication, the muscle relaxants, and their blood thinner medication. If the patient is still at the rehab facility at time of the two week follow up appointment, the skilled rehab facility will also need to assist the patient in arranging follow up appointment in our office and any transportation needs.  MAKE SURE YOU:  Understand these instructions.  Get help right away if you are not doing well or get worse.   DENTAL ANTIBIOTICS:  In most cases prophylactic antibiotics for Dental procdeures after total joint surgery are not necessary.  Exceptions are as follows:  1. History of prior total joint infection  2. Severely immunocompromised (Organ Transplant, cancer chemotherapy, Rheumatoid biologic medications such as Humera)  3. Poorly controlled diabetes (A1C &gt; 8.0, blood glucose over 200)  If you have one of these conditions, contact your surgeon for an antibiotic  prescription, prior to your dental procedure.    Pick up stool softner and laxative for home use following surgery while on pain medications. Do not submerge incision under water. Please use good hand washing techniques while changing dressing each day. May shower starting three days after surgery. Please use a clean towel to pat the incision dry following showers. Continue to use ice for pain and swelling after surgery. Do not use any lotions or creams on the incision until instructed by your surgeon.  

## 2021-12-10 NOTE — Anesthesia Procedure Notes (Signed)
Anesthesia Regional Block: Adductor canal block   Pre-Anesthetic Checklist: , timeout performed,  Correct Patient, Correct Site, Correct Laterality,  Correct Procedure, Correct Position, site marked,  Risks and benefits discussed,  Surgical consent,  Pre-op evaluation,  At surgeon's request and post-op pain management  Laterality: Left  Prep: chloraprep       Needles:  Injection technique: Single-shot  Needle Type: Echogenic Stimulator Needle     Needle Length: 10cm  Needle Gauge: 20     Additional Needles:   Procedures:,,,, ultrasound used (permanent image in chart),,    Narrative:  Start time: 12/10/2021 7:02 AM End time: 12/10/2021 7:06 AM Injection made incrementally with aspirations every 5 mL.  Performed by: Personally  Anesthesiologist: Lidia Collum, MD  Additional Notes: Standard monitors applied. Skin prepped. Good needle visualization with ultrasound. Injection made in 5cc increments with no resistance to injection. Patient tolerated the procedure well.

## 2021-12-10 NOTE — Interval H&P Note (Signed)
History and Physical Interval Note:  12/10/2021 6:28 AM  [name]  has presented today for surgery, with the diagnosis of left knee osteoarthritis.  The various methods of treatment have been discussed with the patient and family. After consideration of risks, benefits and other options for treatment, the patient has consented to  Procedure(s): TOTAL KNEE ARTHROPLASTY (Left) as a surgical intervention.  The patient's history has been reviewed, patient examined, no change in status, stable for surgery.  I have reviewed the patient's chart and labs.  Questions were answered to the patient's satisfaction.     Pilar Plate Tanisa Lagace

## 2021-12-11 DIAGNOSIS — I480 Paroxysmal atrial fibrillation: Secondary | ICD-10-CM | POA: Diagnosis not present

## 2021-12-11 DIAGNOSIS — Z85828 Personal history of other malignant neoplasm of skin: Secondary | ICD-10-CM | POA: Diagnosis not present

## 2021-12-11 DIAGNOSIS — I2510 Atherosclerotic heart disease of native coronary artery without angina pectoris: Secondary | ICD-10-CM | POA: Diagnosis not present

## 2021-12-11 DIAGNOSIS — I110 Hypertensive heart disease with heart failure: Secondary | ICD-10-CM | POA: Diagnosis not present

## 2021-12-11 DIAGNOSIS — Z7901 Long term (current) use of anticoagulants: Secondary | ICD-10-CM | POA: Diagnosis not present

## 2021-12-11 DIAGNOSIS — M1712 Unilateral primary osteoarthritis, left knee: Secondary | ICD-10-CM | POA: Diagnosis not present

## 2021-12-11 LAB — CBC
HCT: 33.7 % — ABNORMAL LOW (ref 39.0–52.0)
Hemoglobin: 11.3 g/dL — ABNORMAL LOW (ref 13.0–17.0)
MCH: 30.7 pg (ref 26.0–34.0)
MCHC: 33.5 g/dL (ref 30.0–36.0)
MCV: 91.6 fL (ref 80.0–100.0)
Platelets: 219 10*3/uL (ref 150–400)
RBC: 3.68 MIL/uL — ABNORMAL LOW (ref 4.22–5.81)
RDW: 12.3 % (ref 11.5–15.5)
WBC: 15.3 10*3/uL — ABNORMAL HIGH (ref 4.0–10.5)
nRBC: 0 % (ref 0.0–0.2)

## 2021-12-11 LAB — BASIC METABOLIC PANEL
Anion gap: 6 (ref 5–15)
BUN: 19 mg/dL (ref 8–23)
CO2: 23 mmol/L (ref 22–32)
Calcium: 8.8 mg/dL — ABNORMAL LOW (ref 8.9–10.3)
Chloride: 109 mmol/L (ref 98–111)
Creatinine, Ser: 0.89 mg/dL (ref 0.61–1.24)
GFR, Estimated: >60 mL/min (ref >60)
Glucose, Bld: 156 mg/dL — ABNORMAL HIGH (ref 70–99)
Potassium: 4.2 mmol/L (ref 3.5–5.1)
Sodium: 138 mmol/L (ref 135–145)

## 2021-12-11 MED ORDER — METHOCARBAMOL 500 MG PO TABS: 500.0000 mg | ORAL_TABLET | Freq: Four times a day (QID) | ORAL | 0 refills | Status: DC | PRN

## 2021-12-11 MED ORDER — OXYCODONE HCL 5 MG PO TABS: 5.0000 mg | ORAL_TABLET | Freq: Four times a day (QID) | ORAL | 0 refills | Status: DC | PRN

## 2021-12-11 MED ORDER — TRAMADOL HCL 50 MG PO TABS
50.0000 mg | ORAL_TABLET | Freq: Four times a day (QID) | ORAL | 0 refills | Status: DC | PRN
Start: 2021-12-11 — End: 2022-01-09

## 2021-12-11 NOTE — Progress Notes (Signed)
Physical Therapy Treatment Patient Details Name: [name] MRN: [id number] DOB: Jul 15, 1945 Today's Date: 12/11/2021   History of Present Illness Patient is 76 y.o. male s/p Lt TKA on 12/10/21 [place] significant for AICD, angina, CAD, CHF, back pain, dizziness, RBBB, GERD, HTN, HLD, OA, OSA, RCA stent in 2009, Rt TKA in 2017.    PT Comments    Patient is making excellent progress and pain is well controlled this AM. Pt instructed on use of belt to mobilize Lt LE in bed and supervision for safety with cues for sequence on bed mobility. Pt demonstrated good carryover for safe use of RW for transfers and gait with min guard/supervision for sit<>stands and for ~100' ambulation. No LOB noted throughout. Patient educated on HEP for ROM, strength, and circulation. Will follow up for final family training session and to address all remaining questions in preparation for safe discharge home.   Recommendations for follow up therapy are one component of a multi-disciplinary discharge planning process, led by the attending physician.  Recommendations may be updated based on patient status, additional functional criteria and insurance authorization.  Follow Up Recommendations  Follow physician's recommendations for discharge plan and follow up therapies     Assistance Recommended at Discharge Intermittent Supervision/Assistance  Patient can return home with the following A little help with walking and/or transfers;A little help with bathing/dressing/bathroom;Assistance with cooking/housework;Direct supervision/assist for medications management;Assist for transportation;Help with stairs or ramp for entrance   Equipment Recommendations  None recommended by PT    Recommendations for Other Services       Precautions / Restrictions Precautions Precautions: Fall Restrictions Weight Bearing Restrictions: No Other Position/Activity Restrictions: WBAT     Mobility  Bed Mobility Overal bed  mobility: Needs Assistance Bed Mobility: Supine to Sit     Supine to sit: HOB elevated, Supervision, Min guard     General bed mobility comments: pt taking extra time, cues for use of belt to assist with Lt LE off EOB.    Transfers Overall transfer level: Needs assistance Equipment used: Rolling walker (2 wheels) Transfers: Sit to/from Stand Sit to Stand: Supervision, Min guard           General transfer comment: cues for bil UE use to initiate power up, min guard for safety with rise. supervision for controlled lower to recliner EOS.    Ambulation/Gait Ambulation/Gait assistance: Min guard Gait Distance (Feet): 100 Feet Assistive device: Rolling walker (2 wheels) Gait Pattern/deviations: Step-to pattern, Decreased stride length, Decreased weight shift to left, Shuffle Gait velocity: decr     General Gait Details: pt maintained safe proximity to RW and demonstrated good caryvoer for step to pattern from prior session. no overt LOB or buckling at Lt knee.   Stairs             Wheelchair Mobility    Modified Rankin (Stroke Patients Only)       Balance Overall balance assessment: Needs assistance Sitting-balance support: Feet supported Sitting balance-Leahy Scale: Good     Standing balance support: Reliant on assistive device for balance, During functional activity, Bilateral upper extremity supported Standing balance-Leahy Scale: Fair                              Cognition Arousal/Alertness: Awake/alert Behavior During Therapy: WFL for tasks assessed/performed Overall Cognitive Status: Within Functional Limits for tasks assessed  Exercises Total Joint Exercises Ankle Circles/Pumps: AROM, Both, 20 reps Quad Sets: AROM, Left, 5 reps Short Arc Quad: AROM, Left, 5 reps Heel Slides: AAROM, Left, 5 reps Hip ABduction/ADduction: AROM, Left, 5 reps    General Comments         Pertinent Vitals/Pain Pain Assessment Pain Assessment: 0-10 Pain Score: 3  Pain Location: Lt knee Pain Descriptors / Indicators: Aching, Discomfort, Burning Pain Intervention(s): Limited activity within patient's tolerance, Monitored during session, Repositioned, Ice applied    Home Living                          Prior Function            PT Goals (current goals can now be found in the care plan section) Acute Rehab PT Goals Patient Stated Goal: stop hurting PT Goal Formulation: With patient Time For Goal Achievement: 12/17/21 Potential to Achieve Goals: Good Progress towards PT goals: Progressing toward goals    Frequency    7X/week      PT Plan Current plan remains appropriate    Co-evaluation              AM-PAC PT "6 Clicks" Mobility   Outcome Measure  Help needed turning from your back to your side while in a flat bed without using bedrails?: A Little Help needed moving from lying on your back to sitting on the side of a flat bed without using bedrails?: A Little Help needed moving to and from a bed to a chair (including a wheelchair)?: A Little Help needed standing up from a chair using your arms (e.g., wheelchair or bedside chair)?: A Little Help needed to walk in hospital room?: A Little Help needed climbing 3-5 steps with a railing? : A Little 6 Click Score: 18    End of Session Equipment Utilized During Treatment: Gait belt Activity Tolerance: Patient tolerated treatment well Patient left: with call bell/phone within reach Nurse Communication: Mobility status PT Visit Diagnosis: Muscle weakness (generalized) (M62.81);Difficulty in walking, not elsewhere classified (R26.2)     Time: 0826-0902 PT Time Calculation (min) (ACUTE ONLY): 36 min  Charges:  $Gait Training: 8-22 mins $Therapeutic Exercise: 8-22 mins                     Verner Mould, DPT Acute Rehabilitation Services Office [phone] Pager [phone]  12/11/21  9:42 AM

## 2021-12-11 NOTE — TOC Transition Note (Signed)
Transition of Care [facility]) - CM/SW Discharge Note   Patient Details  Name: [name] MRN: [id number] Date of Birth: 15-May-1946  [facility] CM/SW Contact:  Lennart Pall, LCSW Phone Number: 12/11/2021, 10:50 AM   Clinical Narrative:    Met with pt and spouse and confirming he has all needed DME at home.  OPPT arranged at [facility].  No TOC needs.   Final next level of care: OP Rehab Barriers to Discharge: No Barriers Identified   Patient Goals and CMS Choice Patient states their goals for this hospitalization and ongoing recovery are:: return home      Discharge Placement                       Discharge Plan and Services                DME Arranged: N/A DME Agency: NA       HH Arranged: NA HH Agency: NA        Social Determinants of Health (SDOH) Interventions     Readmission Risk Interventions     No data to display

## 2021-12-11 NOTE — Progress Notes (Signed)
Discharge instructions reviewed with patient and caregiver. All questions and concerns addressed. IV removed per protocol, tolerated well, intact. All belongings given. Vital signs within normal limits, no s/sx of distress noted.

## 2021-12-11 NOTE — Progress Notes (Signed)
Subjective: 1 Day Post-Op Procedure(s) (LRB): TOTAL KNEE ARTHROPLASTY (Left) Patient reports pain as mild.   Patient seen in rounds by Dr. Wynelle Link. Patient is well, and has had no acute complaints or problems No issues overnight. Denies chest pain, SOB, or calf pain. Foley catheter removed this AM.  We will continue therapy today, ambulated 4' yesterday.   Objective: Vital signs in last 24 hours: Temp:  [97.5 F (36.4 C)-99.5 F (37.5 C)] 99.1 F (37.3 C) (07/25 0524) Pulse Rate:  [59-109] 76 (07/25 0524) Resp:  [12-18] 18 (07/25 0524) BP: (109-138)/(46-82) 109/46 (07/25 0524) SpO2:  [90 %-99 %] 94 % (07/25 0524) Weight:  [86.2 kg] 86.2 kg (07/24 1123)  Intake/Output from previous day:  Intake/Output Summary (Last 24 hours) at 12/11/2021 0751 Last data filed at 12/11/2021 0600 Gross per 24 hour  Intake 4309.26 ml  Output 2225 ml  Net 2084.26 ml     Intake/Output this shift: No intake/output data recorded.  Labs: Recent Labs    12/11/21 0316  HGB 11.3*   Recent Labs    12/11/21 0316  WBC 15.3*  RBC 3.68*  HCT 33.7*  PLT 219   Recent Labs    12/11/21 0316  NA 138  K 4.2  CL 109  CO2 23  BUN 19  CREATININE 0.89  GLUCOSE 156*  CALCIUM 8.8*   No results for input(s): "LABPT", "INR" in the last 72 hours.  Exam: General - Patient is Alert and Oriented Extremity - Neurologically intact Neurovascular intact Sensation intact distally Dorsiflexion/Plantar flexion intact Dressing - dressing C/D/I Motor Function - intact, moving foot and toes well on exam.   Past Medical History:  Diagnosis Date   AICD (automatic cardioverter/defibrillator) present    Allergic rhinitis    Allergy    Anginal pain ([place]) 05/26/14   chest pain after chasing dog   Atypical nevus of back 04/27/2003   moderate - mid lower back   Basal cell carcinoma 01/26/2008   right cheek - MOHs   CAD (coronary artery disease)    hx of stent- 2005 RCA   Cataract    beginning stage  both eyes   CHF (congestive heart failure) (HCC)    pacemaker Medtronic     Chronic back pain    intermittent   Constipation    Dizziness    Dysrhythmia    right bundle branch block    Esophageal stricture    GERD (gastroesophageal reflux disease)    Hemoptysis    Hiatal hernia    History of colonic polyps    hyperplastic   HTN (hypertension)    Hyperlipidemia    Hypertrophy of prostate with urinary obstruction and other lower urinary tract symptoms (LUTS)    OA (osteoarthritis)    OSA (obstructive sleep apnea)    cpap- 10    Other specified disorder of stomach and duodenum    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   Weight  as of this encounter: 86.2 kg. Advance diet Up with therapy D/C IV fluids   Patient's anticipated LOS is less than 2 midnights, meeting these requirements: - Lives within 1 hour of care - Has a competent adult at home to recover with post-op recover - NO history of  - Diabetes  - Heart attack  - Stroke  - DVT/VTE  - Respiratory Failure/COPD  - Renal failure  - Anemia  - Advanced Liver disease  DVT Prophylaxis -  Eliquis Weight bearing as tolerated. Continue therapy.  Plan is to go Home after hospital stay. Plan for discharge later today if progresses with therapy and meeting goals. Scheduled for OPPT at [facility]. Follow-up in the office in 2 weeks.  The PDMP database was reviewed today prior to any opioid medications being prescribed to  this patient.  Theresa Duty, PA-C Orthopedic Surgery [phone] 12/11/2021, 7:51 AM

## 2021-12-11 NOTE — Progress Notes (Signed)
Physical Therapy Treatment Patient Details Name: [name] MRN: [id number] DOB: 1945/10/14 Today's Date: 12/11/2021   History of Present Illness Patient is 76 y.o. male s/p Lt TKA on 12/10/21 [place] significant for AICD, angina, CAD, CHF, back pain, dizziness, RBBB, GERD, HTN, HLD, OA, OSA, RCA stent in 2009, Rt TKA in 2017.    PT Comments    Patient is progressing well with mobility and demonstrates excellent carryover for safe walker management. Patient's wife present and provided safe guarding during transfers and gait with cues and supervision from therapist. EOS reviewed HEP and safe car transfer technique. He will benefit from continued therapy in acute setting and follow up per MD rec. Pt is safe for discharge home with assist from spouse.    Recommendations for follow up therapy are one component of a multi-disciplinary discharge planning process, led by the attending physician.  Recommendations may be updated based on patient status, additional functional criteria and insurance authorization.  Follow Up Recommendations  Follow physician's recommendations for discharge plan and follow up therapies     Assistance Recommended at Discharge Intermittent Supervision/Assistance  Patient can return home with the following A little help with walking and/or transfers;A little help with bathing/dressing/bathroom;Assistance with cooking/housework;Direct supervision/assist for medications management;Assist for transportation;Help with stairs or ramp for entrance   Equipment Recommendations  None recommended by PT    Recommendations for Other Services       Precautions / Restrictions Precautions Precautions: Fall Restrictions Weight Bearing Restrictions: No Other Position/Activity Restrictions: WBAT     Mobility  Bed Mobility Overal bed mobility: Needs Assistance Bed Mobility: Supine to Sit     Supine to sit: HOB elevated, Supervision, Min guard     General bed mobility  comments: pt OOB in recliner    Transfers Overall transfer level: Needs assistance Equipment used: Rolling walker (2 wheels) Transfers: Sit to/from Stand Sit to Stand: Supervision           General transfer comment: cues to use bil UE for power up, supervision for safety. pt's wife guarding.    Ambulation/Gait Ambulation/Gait assistance: Supervision Gait Distance (Feet): 100 Feet Assistive device: Rolling walker (2 wheels) Gait Pattern/deviations: Step-to pattern, Decreased stride length, Decreased weight shift to left, Shuffle Gait velocity: decr     General Gait Details: supervision for safety, pt demonstrated safe mangement of RW, good carryover. pt's spouse provided guarding with cues and supervision from therapist.   Stairs             Wheelchair Mobility    Modified Rankin (Stroke Patients Only)       Balance Overall balance assessment: Needs assistance Sitting-balance support: Feet supported Sitting balance-Leahy Scale: Good     Standing balance support: Reliant on assistive device for balance, During functional activity, Bilateral upper extremity supported Standing balance-Leahy Scale: Fair                              Cognition Arousal/Alertness: Awake/alert Behavior During Therapy: WFL for tasks assessed/performed Overall Cognitive Status: Within Functional Limits for tasks assessed                                          Exercises Total Joint Exercises Ankle Circles/Pumps: AROM, Both, 20 reps Quad Sets: AROM, Left, 10 reps Short Arc Quad: AROM, Left, 5 reps Heel Slides: Left, AROM,  10 reps Hip ABduction/ADduction: AROM, Left, 5 reps    General Comments        Pertinent Vitals/Pain Pain Assessment Pain Assessment: 0-10 Pain Score: 5  Pain Location: Lt knee Pain Descriptors / Indicators: Aching, Discomfort, Burning Pain Intervention(s): Limited activity within patient's tolerance, Monitored during  session, Repositioned, Ice applied, Premedicated before session    Home Living                          Prior Function            PT Goals (current goals can now be found in the care plan section) Acute Rehab PT Goals Patient Stated Goal: stop hurting PT Goal Formulation: With patient Time For Goal Achievement: 12/17/21 Potential to Achieve Goals: Good Progress towards PT goals: Progressing toward goals    Frequency    7X/week      PT Plan Current plan remains appropriate    Co-evaluation              AM-PAC PT "6 Clicks" Mobility   Outcome Measure  Help needed turning from your back to your side while in a flat bed without using bedrails?: A Little Help needed moving from lying on your back to sitting on the side of a flat bed without using bedrails?: A Little Help needed moving to and from a bed to a chair (including a wheelchair)?: A Little Help needed standing up from a chair using your arms (e.g., wheelchair or bedside chair)?: A Little Help needed to walk in hospital room?: A Little Help needed climbing 3-5 steps with a railing? : A Little 6 Click Score: 18    End of Session Equipment Utilized During Treatment: Gait belt Activity Tolerance: Patient tolerated treatment well Patient left: with call bell/phone within reach Nurse Communication: Mobility status PT Visit Diagnosis: Muscle weakness (generalized) (M62.81);Difficulty in walking, not elsewhere classified (R26.2)     Time: 4967-5916 PT Time Calculation (min) (ACUTE ONLY): 25 min  Charges:  $Gait Training: 8-22 mins $Therapeutic Exercise: 8-22 mins                     Verner Mould, DPT Acute Rehabilitation Services Office [phone] Pager [phone]  12/11/21 12:28 PM

## 2021-12-12 ENCOUNTER — Encounter (HOSPITAL_COMMUNITY): Payer: Self-pay | Admitting: Orthopedic Surgery

## 2021-12-13 DIAGNOSIS — M25562 Pain in left knee: Secondary | ICD-10-CM | POA: Diagnosis not present

## 2021-12-17 DIAGNOSIS — M25562 Pain in left knee: Secondary | ICD-10-CM | POA: Diagnosis not present

## 2021-12-17 NOTE — Discharge Summary (Signed)
Patient ID: CHON BUHL MRN: [id number] DOB/AGE: [birth year] 76 y.o.  Admit date: 12/10/2021 Discharge date: 12/11/2021  Admission Diagnoses:  Principal Problem:   OA (osteoarthritis) of knee Active Problems:   Osteoarthritis of left knee   Discharge Diagnoses:  Same  Past Medical History:  Diagnosis Date   AICD (automatic cardioverter/defibrillator) present    Allergic rhinitis    Allergy    Anginal pain ([place]) 05/26/14   chest pain after chasing dog   Atypical nevus of back 04/27/2003   moderate - mid lower back   Basal cell carcinoma 01/26/2008   right cheek - MOHs   CAD (coronary artery disease)    hx of stent- 2005 RCA   Cataract    beginning stage both eyes   CHF (congestive heart failure) (HCC)    pacemaker Medtronic     Chronic back pain    intermittent   Constipation    Dizziness    Dysrhythmia    right bundle branch block    Esophageal stricture    GERD (gastroesophageal reflux disease)    Hemoptysis    Hiatal hernia    History of colonic polyps    hyperplastic   HTN (hypertension)    Hyperlipidemia    Hypertrophy of prostate with urinary obstruction and other lower urinary tract symptoms (LUTS)    OA (osteoarthritis)    OSA (obstructive sleep apnea)    cpap- 10    Other specified disorder of stomach and duodenum    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 male who was admitted 12/10/2021 for operative treatment ofOA (osteoarthritis) of knee. Patient has severe unremitting pain that affects sleep, daily activities, and work/hobbies. After pre-op clearance the patient was taken to the operating  room on 12/10/2021 and underwent  Procedure(s): TOTAL KNEE ARTHROPLASTY.    Patient was given perioperative antibiotics:  Anti-infectives (From admission, onward)    Start     Dose/Rate Route Frequency Ordered Stop   12/11/21 1000  hydroxychloroquine (PLAQUENIL) tablet 200 mg  Status:  Discontinued        200 mg Oral Daily 12/10/21 1059 12/11/21 1757   12/10/21 1400  ceFAZolin (ANCEF) IVPB 2g/100 mL premix        2 g 200 mL/hr over 30 Minutes Intravenous Every 6 hours 12/10/21 1059 12/10/21 2024   12/10/21 0600  ceFAZolin (ANCEF) IVPB 2g/100 mL premix        2 g 200 mL/hr over 30 Minutes Intravenous On call to O.R. 12/10/21 0530 12/10/21 0729   12/10/21 0532  ceFAZolin (ANCEF) 2-4 GM/100ML-% IVPB       Note to Pharmacy: Karsten Ro: cabinet override      12/10/21 0532 12/10/21 0729        Patient was given sequential compression devices, early ambulation, and chemoprophylaxis to prevent DVT.  Patient benefited maximally from hospital stay and there were no complications.    Recent vital signs: No data found.   Recent laboratory studies: No results for input(s): "WBC", "HGB", "HCT", "PLT", "NA", "K", "CL", "CO2", "BUN", "CREATININE", "GLUCOSE", "INR", "CALCIUM" in the last 72 hours.  Invalid input(s): "PT", "2"   Discharge Medications:   Allergies as of 12/11/2021       Reactions   Sulfonamide Derivatives Rash            Medication List     STOP taking these medications    diclofenac sodium 1 % Gel Commonly known as: VOLTAREN   nabumetone 500 MG tablet Commonly known as: RELAFEN       TAKE these medications    acetaminophen 650 MG CR tablet Commonly known as: TYLENOL Take 650 mg by mouth in the morning and at bedtime.   ascorbic acid 500 MG tablet Commonly known as: VITAMIN C Take 500 mg by mouth daily.   atorvastatin 20 MG tablet Commonly known as: LIPITOR TAKE 1 TABLET BY MOUTH DAILY   b complex vitamins tablet Take 1 tablet by mouth daily  with lunch.   carvedilol 25 MG tablet Commonly known as: COREG TAKE 1 TABLET BY MOUTH TWICE DAILY   cholecalciferol 25 MCG (1000 UNIT) tablet Commonly known as: VITAMIN D3 Take 1,000 Units by mouth daily.   CO Q 10 PO Take 200 mg by mouth daily with lunch.   Eliquis 5 MG Tabs tablet Generic drug: apixaban TAKE 1 TABLET BY MOUTH TWICE DAILY   Entresto 49-51 MG Generic drug: sacubitril-valsartan Take 1 tablet by mouth 2 (two) times daily.   FLAXSEED OIL PO Take 1 capsule by mouth daily. With Omega 3   folic acid 086 MCG tablet Commonly known as: FOLVITE Take 400 mcg by mouth daily.   GLUCOSAMINE PO Take 1,500 mg by mouth every evening.   hydrocortisone 2.5 % cream Apply 1 application topically 2 (two) times daily as needed (rash on nose).   hydroxychloroquine 200 MG tablet Commonly known as: PLAQUENIL Take 1 tablet by mouth daily Monday-Friday only. Do not take on Saturday or Sunday.   Lutein 20 MG Tabs Take 20 mg by mouth daily.   methocarbamol 500 MG tablet Commonly known as: ROBAXIN Take 1 tablet (500 mg total) by mouth every 6 (six) hours as needed for muscle spasms.   multivitamin capsule Take 1 capsule by mouth daily.   nitroGLYCERIN 0.4 MG SL tablet Commonly known as: NITROSTAT Place 1 tablet (0.4 mg total) under the tongue every 5 (five) minutes x 3 doses as needed for chest pain.   NON FORMULARY CPAP machine with sleep.   oxyCODONE 5 MG immediate release tablet Commonly known as: Oxy IR/ROXICODONE Take 1-2 tablets (5-10 mg total) by mouth every 6 (six) hours as needed for severe pain.   pantoprazole 40 MG tablet Commonly known as: PROTONIX Take 1 tablet (40 mg total) by mouth 2 (two) times daily before a meal. TAKE 1 TABLET BY MOUTH EVERY DAY BEFORE BREAKFAST What changed: when to take this   polyethylene glycol 17 g packet Commonly known as: MIRALAX / GLYCOLAX Take 17 g by mouth daily as needed for mild constipation.   PROBIOTIC DAILY  PO Take 1 capsule by mouth daily with lunch.   Restasis 0.05 % ophthalmic emulsion Generic drug: cycloSPORINE Place 1 drop into both eyes 2 (two) times daily as needed (dry eyes).   SAW PALMETTO PO Take 450 mg by mouth every evening.   spironolactone 25 MG tablet Commonly known as: ALDACTONE TAKE 1/2 TABLET(12.5 MG) BY MOUTH DAILY   sucralfate 1 g tablet Commonly known as: CARAFATE Take 1 tablet (1 g total) by mouth 4 (four) times daily. What changed:  when to take this reasons  to take this   tamsulosin 0.4 MG Caps capsule Commonly known as: FLOMAX Take 0.4 mg by mouth daily.   Testosterone 20 % Crea Apply 2 mLs topically daily. Rub on shoulder   traMADol 50 MG tablet Commonly known as: ULTRAM Take 1-2 tablets (50-100 mg total) by mouth every 6 (six) hours as needed for moderate pain. What changed: See the new instructions.   TURMERIC PO Take 1 tablet by mouth daily with lunch.   zinc gluconate 50 MG tablet Take 50 mg by mouth daily.               Discharge Care Instructions  (From admission, onward)           Start     Ordered   12/11/21 0000  Weight bearing as tolerated        12/11/21 0754   12/11/21 0000  Change dressing       Comments: You may remove the bulky bandage (ACE wrap and gauze) two days after surgery. You will have an adhesive waterproof bandage underneath. Leave this in place until your first follow-up appointment.   12/11/21 0754            Diagnostic Studies: DG ERCP  Result Date: 11/29/2021 CLINICAL DATA:  History of ampullary adenoma. EXAM: ERCP COMPARISON:  ERCP, 03/19/2021.  CT AP, 03/22/2021. FLUOROSCOPY: Exposure Index (as provided by the fluoroscopic device): 18.4 mGy Kerma FINDINGS: Multiple, limited oblique planar images of the RIGHT upper quadrant obtained C-arm. Pre-existing plastic biliary stent. Images demonstrating flexible endoscopy, biliary duct cannulation retrograde cholangiogram and biliary stent removal. No  biliary ductal dilation. No evidence of biliary filling defect is demonstrated. IMPRESSION: Fluoroscopic imaging for ERCP and plastic biliary stent removal. For complete description of intra procedural findings, please see performing service dictation. Electronically Signed   By: Michaelle Birks M.D.   On: 11/29/2021 10:59    Disposition: Discharge disposition: 01-Home or Self Care       Discharge Instructions     Call MD / Call 911   Complete by: As directed    If you experience chest pain or shortness of breath, CALL 911 and be transported to the hospital emergency room.  If you develope a fever above 101 F, pus (white drainage) or increased drainage or redness at the wound, or calf pain, call your surgeon's office.   Change dressing   Complete by: As directed    You may remove the bulky bandage (ACE wrap and gauze) two days after surgery. You will have an adhesive waterproof bandage underneath. Leave this in place until your first follow-up appointment.   Constipation Prevention   Complete by: As directed    Drink plenty of fluids.  Prune juice may be helpful.  You may use a stool softener, such as Colace (over the counter) 100 mg twice a day.  Use MiraLax (over the counter) for constipation as needed.   Diet - low sodium heart healthy   Complete by: As directed    Do not put a pillow under the knee. Place it under the heel.   Complete by: As directed    Driving restrictions   Complete by: As directed    No driving for two weeks   Post-operative opioid taper instructions:   Complete by: As directed    POST-OPERATIVE OPIOID TAPER INSTRUCTIONS: It is important to wean off of your opioid medication as soon as possible. If you do not need pain medication after your surgery it is  ok to stop day one. Opioids include: Codeine, Hydrocodone(Norco, Vicodin), Oxycodone(Percocet, oxycontin) and hydromorphone amongst others.  Long term and even short term use of opiods can cause: Increased pain  response Dependence Constipation Depression Respiratory depression And more.  Withdrawal symptoms can include Flu like symptoms Nausea, vomiting And more Techniques to manage these symptoms Hydrate well Eat regular healthy meals Stay active Use relaxation techniques(deep breathing, meditating, yoga) Do Not substitute Alcohol to help with tapering If you have been on opioids for less than two weeks and do not have pain than it is ok to stop all together.  Plan to wean off of opioids This plan should start within one week post op of your joint replacement. Maintain the same interval or time between taking each dose and first decrease the dose.  Cut the total daily intake of opioids by one tablet each day Next start to increase the time between doses. The last dose that should be eliminated is the evening dose.      TED hose   Complete by: As directed    Use stockings (TED hose) for three weeks on both leg(s).  You may remove them at night for sleeping.   Weight bearing as tolerated   Complete by: As directed         Follow-up Information     Izola Teague L, PA. Go on 12/24/2021.   Specialty: Orthopedic Surgery Why: You are scheduled for a follow up appointment on 12-24-21 at 9:15 am. Contact information: [address] [place] [id number] [phone]                  Signed: Theresa Duty 12/17/2021, 7:19 AM

## 2021-12-19 DIAGNOSIS — M25562 Pain in left knee: Secondary | ICD-10-CM | POA: Diagnosis not present

## 2021-12-21 DIAGNOSIS — M25562 Pain in left knee: Secondary | ICD-10-CM | POA: Diagnosis not present

## 2021-12-24 ENCOUNTER — Ambulatory Visit (INDEPENDENT_AMBULATORY_CARE_PROVIDER_SITE_OTHER): Payer: Medicare Other

## 2021-12-24 DIAGNOSIS — Z95810 Presence of automatic (implantable) cardiac defibrillator: Secondary | ICD-10-CM | POA: Diagnosis not present

## 2021-12-24 DIAGNOSIS — M25562 Pain in left knee: Secondary | ICD-10-CM | POA: Diagnosis not present

## 2021-12-24 DIAGNOSIS — I5022 Chronic systolic (congestive) heart failure: Secondary | ICD-10-CM

## 2021-12-25 DIAGNOSIS — M0569 Rheumatoid arthritis of multiple sites with involvement of other organs and systems: Secondary | ICD-10-CM | POA: Diagnosis not present

## 2021-12-25 DIAGNOSIS — H01134 Eczematous dermatitis of left upper eyelid: Secondary | ICD-10-CM | POA: Diagnosis not present

## 2021-12-25 DIAGNOSIS — Z79899 Other long term (current) drug therapy: Secondary | ICD-10-CM | POA: Diagnosis not present

## 2021-12-25 DIAGNOSIS — H04129 Dry eye syndrome of unspecified lacrimal gland: Secondary | ICD-10-CM | POA: Diagnosis not present

## 2021-12-26 ENCOUNTER — Telehealth: Payer: Self-pay

## 2021-12-26 DIAGNOSIS — M25562 Pain in left knee: Secondary | ICD-10-CM | POA: Diagnosis not present

## 2021-12-26 NOTE — Telephone Encounter (Signed)
Remote ICM transmission received.  Attempted call to patient regarding ICM remote transmission and left detailed message per DPR.  Advised to return call for any fluid symptoms or questions. Next ICM remote transmission scheduled 01/28/2022.    

## 2021-12-26 NOTE — Progress Notes (Signed)
EPIC Encounter for ICM Monitoring  Patient Name: [name] is a 76 y.o. male Date: 12/26/2021 Primary Care Physican: Biagio Borg, MD Primary Cardiologist: Burt Knack Electrophysiologist: Curt Bears Bi-V Pacing:   96%  Effective 95.4%      07/04/2021 Weight: 197 lbs  10/17/2021 Weight: 190 lbs 11/21/2021 Weight: 190 lbs   Time in AT/AF  0.0 hr/day (0.0%)                                                            Attempted call to patient and unable to reach.  Left detailed message per DPR regarding transmission. Transmission reviewed.   Total Knee Replacement 7/24.   Optivol thoracic impedance suggesting normal fluid levels.   Prescribed: Spironolactone 25 mg take 0.5 tablet daily.   Labs: 09/27/2021 Creatinine 0.88, BUN 16, Potassium 3.7, Sodium 140, GFR 82.91 09/18/2021 Creatinine 0.86, BUN 16, Potassium 4.3, Sodium 141   Recommendations:  Left voice mail with ICM number and encouraged to call if experiencing any fluid symptoms.   Follow-up plan: ICM clinic phone appointment on 01/28/2022.   91 day device clinic remote transmission 01/31/2022.       EP/Cardiology Office Visits:   05/01/2022 with Dr Burt Knack.     Copy of ICM check sent to Dr. Curt Bears.   3 month ICM trend: 12/24/2021.    12-14 Month ICM trend:     [name], RN 12/26/2021 10:31 AM

## 2021-12-28 DIAGNOSIS — M25562 Pain in left knee: Secondary | ICD-10-CM | POA: Diagnosis not present

## 2022-01-01 DIAGNOSIS — M25562 Pain in left knee: Secondary | ICD-10-CM | POA: Diagnosis not present

## 2022-01-03 DIAGNOSIS — M25562 Pain in left knee: Secondary | ICD-10-CM | POA: Diagnosis not present

## 2022-01-08 DIAGNOSIS — M25562 Pain in left knee: Secondary | ICD-10-CM | POA: Diagnosis not present

## 2022-01-09 ENCOUNTER — Other Ambulatory Visit: Payer: Self-pay | Admitting: Physician Assistant

## 2022-01-09 NOTE — Telephone Encounter (Signed)
Patient just received oxycodone.  He did not notify us.  Please call patient to find out the reason he is getting oxycodone.  He should not be getting narcotic medications from other physicians without notifying us.

## 2022-01-09 NOTE — Telephone Encounter (Signed)
Next Visit: 02/19/2022   Last Visit: 09/18/2021   UDS:09/18/2021, UDS is c/w  Tramadol use.   Narc Agreement: 04/19/2021   Last Fill: 12/11/2021 Tramadol '50mg'$  by mouth at bedtime as needed.   Okay to refill Tramadol?

## 2022-01-10 DIAGNOSIS — M25562 Pain in left knee: Secondary | ICD-10-CM | POA: Diagnosis not present

## 2022-01-14 DIAGNOSIS — Z5189 Encounter for other specified aftercare: Secondary | ICD-10-CM | POA: Diagnosis not present

## 2022-01-15 DIAGNOSIS — M25562 Pain in left knee: Secondary | ICD-10-CM | POA: Diagnosis not present

## 2022-01-28 ENCOUNTER — Ambulatory Visit (INDEPENDENT_AMBULATORY_CARE_PROVIDER_SITE_OTHER): Payer: Medicare Other

## 2022-01-28 DIAGNOSIS — I5022 Chronic systolic (congestive) heart failure: Secondary | ICD-10-CM

## 2022-01-28 DIAGNOSIS — Z95810 Presence of automatic (implantable) cardiac defibrillator: Secondary | ICD-10-CM | POA: Diagnosis not present

## 2022-01-28 NOTE — Progress Notes (Signed)
EPIC Encounter for ICM Monitoring  Patient Name: [name] is a 76 y.o. male Date: 01/28/2022 Primary Care Physican: Biagio Borg, MD Primary Cardiologist: Burt Knack Electrophysiologist: Curt Bears Bi-V Pacing:   96%  Effective 95.4%      07/04/2021 Weight: 197 lbs  10/17/2021 Weight: 190 lbs 11/21/2021 Weight: 190 lbs   Time in AT/AF  0.0 hr/day (0.0%)                                                            Spoke with patient and heart failure questions reviewed.  Pt he has slight swelling at the top of sock band.  He had knee surgery about 6 weeks ago and has been eating mostly restaurant foods which may contribute to possible fluid accumulation.    Optivol thoracic impedance suggesting minimal amount of possible fluid accumulation starting 9/4.   Prescribed: Spironolactone 25 mg take 0.5 tablet daily.   Labs: 09/27/2021 Creatinine 0.88, BUN 16, Potassium 3.7, Sodium 140, GFR 82.91 09/18/2021 Creatinine 0.86, BUN 16, Potassium 4.3, Sodium 141   Recommendations:  Recommendation to limit salt intake to 2000 mg daily and fluid intake to 64 oz daily.  Encouraged to call if experiencing any fluid symptoms.    Follow-up plan: ICM clinic phone appointment on 02/11/2022 to recheck fluid levels.   91 day device clinic remote transmission 01/31/2022.       EP/Cardiology Office Visits:   05/01/2022 with Dr Burt Knack.     Copy of ICM check sent to Dr. Curt Bears.   3 month ICM trend: 01/28/2022.    12-14 Month ICM trend:     [name], RN 01/28/2022 10:52 AM

## 2022-01-31 ENCOUNTER — Other Ambulatory Visit: Payer: Self-pay | Admitting: Rheumatology

## 2022-01-31 ENCOUNTER — Ambulatory Visit (INDEPENDENT_AMBULATORY_CARE_PROVIDER_SITE_OTHER): Payer: Medicare Other

## 2022-01-31 DIAGNOSIS — M1990 Unspecified osteoarthritis, unspecified site: Secondary | ICD-10-CM

## 2022-01-31 DIAGNOSIS — I428 Other cardiomyopathies: Secondary | ICD-10-CM

## 2022-01-31 LAB — CUP PACEART REMOTE DEVICE CHECK
Battery Remaining Longevity: 21 mo
Battery Voltage: 2.92 V
Brady Statistic AP VP Percent: 0.2 %
Brady Statistic AP VS Percent: 0.02 %
Brady Statistic AS VP Percent: 97.18 %
Brady Statistic AS VS Percent: 2.6 %
Brady Statistic RA Percent Paced: 0.21 %
Brady Statistic RV Percent Paced: 43.79 %
Date Time Interrogation Session: 20230914022608
HighPow Impedance: 76 Ohm
Implantable Lead Implant Date: 20181108
Implantable Lead Implant Date: 20181108
Implantable Lead Implant Date: 20181108
Implantable Lead Location: 753858
Implantable Lead Location: 753859
Implantable Lead Location: 753860
Implantable Lead Model: 4298
Implantable Lead Model: 5076
Implantable Pulse Generator Implant Date: 20181108
Lead Channel Impedance Value: 1121 Ohm
Lead Channel Impedance Value: 1140 Ohm
Lead Channel Impedance Value: 1235 Ohm
Lead Channel Impedance Value: 193.707
Lead Channel Impedance Value: 211.891
Lead Channel Impedance Value: 250.371
Lead Channel Impedance Value: 276.523
Lead Channel Impedance Value: 315.129
Lead Channel Impedance Value: 361 Ohm
Lead Channel Impedance Value: 399 Ohm
Lead Channel Impedance Value: 418 Ohm
Lead Channel Impedance Value: 513 Ohm
Lead Channel Impedance Value: 589 Ohm
Lead Channel Impedance Value: 608 Ohm
Lead Channel Impedance Value: 646 Ohm
Lead Channel Impedance Value: 665 Ohm
Lead Channel Impedance Value: 779 Ohm
Lead Channel Impedance Value: 817 Ohm
Lead Channel Pacing Threshold Amplitude: 0.375 V
Lead Channel Pacing Threshold Amplitude: 0.625 V
Lead Channel Pacing Threshold Amplitude: 2.25 V
Lead Channel Pacing Threshold Pulse Width: 0.4 ms
Lead Channel Pacing Threshold Pulse Width: 0.4 ms
Lead Channel Pacing Threshold Pulse Width: 1 ms
Lead Channel Sensing Intrinsic Amplitude: 16 mV
Lead Channel Sensing Intrinsic Amplitude: 16 mV
Lead Channel Sensing Intrinsic Amplitude: 2.375 mV
Lead Channel Sensing Intrinsic Amplitude: 2.375 mV
Lead Channel Setting Pacing Amplitude: 2 V
Lead Channel Setting Pacing Amplitude: 2.5 V
Lead Channel Setting Pacing Amplitude: 3 V
Lead Channel Setting Pacing Pulse Width: 0.4 ms
Lead Channel Setting Pacing Pulse Width: 1 ms
Lead Channel Setting Sensing Sensitivity: 0.3 mV

## 2022-01-31 NOTE — Telephone Encounter (Signed)
Next Visit: 02/19/2022   Last Visit: 09/18/2021   Labs: 11/26/2021 Glucose 145   Eye exam: 03/14/2021 normal.     Current Dose per office note 09/18/2021: PLQ 200 mg 1 tablet po Monday to Friday   :[id number] arthritis involving multiple sites with positive rheumatoid factor    Last Fill: 12/04/2021   Okay to refill Plaquenil?

## 2022-02-05 NOTE — Progress Notes (Signed)
Office Visit Note  Patient: [name]             Date of Birth: 01/17/1946           MRN: [id number]             PCP: Biagio Borg, MD Referring: Biagio Borg, MD Visit Date: 02/19/2022 Occupation: '@GUAROCC'$ @  Subjective:  Follow-up (Joint pain in hands, knees, back and neck. Feeling about the same as before. )   History of Present Illness: [name] is a 76 y.o. male with history of severe rheumatoid arthritis and osteoarthritis overlap.  He states he underwent left total knee replacement on December 10, 2021 by Dr. Maureen Ralphs.  He had physical therapy.  He still has significant pain and discomfort in his left knee and limited extension.  Has been trying to do some stretching exercises at home.  He continues to have a stiffness in multiple joints but he has not noticed any joint swelling.  He has been taking hydroxychloroquine 1 tablet p p.o. daily Monday to Friday.  He has not noticed any joint swelling.  He has discomfort in multiple joints due to underlying osteoarthritis.  He continues to have discomfort over the left trochanteric bursa area and some discomfort in the SI joints.  He complains of discomfort in his lumbar spine.  Activities of Daily Living:  Patient reports morning stiffness for 15-30 minutes.   Patient Denies nocturnal pain.  Difficulty dressing/grooming: Denies Difficulty climbing stairs: Reports Difficulty getting out of chair: Reports Difficulty using hands for taps, buttons, cutlery, and/or writing: Reports  Review of Systems  Constitutional:  Negative for fatigue.  HENT:  Negative for mouth sores and mouth dryness.   Eyes:  Positive for dryness.  Respiratory:  Negative for difficulty breathing.   Cardiovascular:  Negative for chest pain and palpitations.  Gastrointestinal:  Negative for blood in stool, constipation and diarrhea.  Endocrine: Negative for increased urination.  Genitourinary:  Negative for involuntary urination.  Musculoskeletal:   Positive for joint pain, gait problem, joint pain and morning stiffness. Negative for myalgias, muscle weakness, muscle tenderness and myalgias.  Skin:  Negative for color change, rash, hair loss and sensitivity to sunlight.  Allergic/Immunologic: Negative for susceptible to infections.  Neurological:  Negative for dizziness and headaches.  Hematological:  Negative for swollen glands.  Psychiatric/Behavioral:  Negative for depressed mood. The patient is not nervous/anxious.     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   Abnormal findings on esophagogastroduodenoscopy (EGD) 02/03/2021   Polyp of duodenum 02/03/2021   Axillary abscess 06/26/2020   Chronic diarrhea 06/26/2020   Fever 01/18/2020   Pericarditis 07/06/2019   Right hip pain 07/06/2019   Acute chest pain 01/24/2019   Prediabetes 12/24/2018   Rheumatoid arthritis ([place]) 12/24/2018   Preventative health care 06/25/2018   Incarcerated ventral hernia 03/20/2018   DCM (dilated cardiomyopathy) ([place]) 00/93/8182   Chronic systolic heart failure ([place]) 01/15/2017   History of pneumonia 10/23/2016   OA (osteoarthritis) of knee 10/23/2016   Primary osteoarthritis of both feet 10/04/2016  Primary osteoarthritis of left knee 10/04/2016   DDD (degenerative disc disease), lumbar 10/04/2016   Hemoptysis 05/27/2016   Overweight (BMI 25.0-29.9) 03/14/2016   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   Parotid mass 08/23/2015   Dyspnea 11/29/2014   Synovial cyst of lumbar facet joint 09/26/2014   Incisional hernia, without obstruction or gangrene 12/06/2013   Nausea  alone 08/27/2013   Colon cancer screening 08/27/2013   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   Benign prostatic hyperplasia with urinary obstruction 06/18/2010   Other specified disorder of stomach and duodenum 12/16/2008   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 11/15/2008   Gastroesophageal reflux disease 09/02/2008   COLONIC POLYPS, HYPERPLASTIC 05/24/2007   OSA on CPAP 05/24/2007   ALLERGIC RHINITIS 05/24/2007   ESOPHAGEAL STRICTURE 05/24/2007   HIATAL HERNIA 05/24/2007   Hyperlipidemia 02/06/2007   Essential hypertension 02/06/2007   Coronary artery disease involving native coronary artery of native heart without angina pectoris 02/06/2007   Primary osteoarthritis of both hands 02/06/2007   Neck pain on right side 02/06/2007    Past Medical History:  Diagnosis Date   AICD (automatic cardioverter/defibrillator) present    Allergic rhinitis    Allergy    Anginal pain ([place]) 05/26/14   chest pain after chasing dog   Atypical nevus of back 04/27/2003   moderate - mid lower back   Basal cell carcinoma 01/26/2008   right cheek - MOHs   CAD (coronary artery disease)    hx of stent- 2005 RCA   Cataract    beginning stage both eyes   CHF (congestive heart failure) ([place])    pacemaker Medtronic     Chronic back pain    intermittent   Constipation    Dizziness    Dysrhythmia    right bundle branch block    Esophageal stricture    GERD (gastroesophageal reflux disease)    Hemoptysis    Hiatal hernia    History of colonic polyps    hyperplastic   HTN (hypertension)    Hyperlipidemia    Hypertrophy of prostate with urinary obstruction and other lower urinary tract symptoms (LUTS)    OA (osteoarthritis)    OSA (obstructive sleep apnea)    cpap- 10    Other specified disorder of stomach and duodenum    duodenal periampulary tubulovillous adenoma removed by Dr. Ardis Hughs 5/10   Pericarditis 07/06/2019   Pneumonia    Pre-diabetes    no medications   Shortness of breath     with exertion   Sleep apnea    cpap   Testicular hypofunction     Family History  Problem Relation Age of Onset   Heart disease Mother    Coronary artery disease Father    Diabetes Father    Sudden death Father        due to heart disease   Heart disease Father    COPD Brother    Arthritis Brother    Heart failure Brother    Cancer Brother        lung    Stomach cancer Paternal Grandmother    Arthritis Daughter    Hypertension Other    Colon cancer Neg Hx    Esophageal cancer Neg Hx    Colon polyps Neg Hx    Ulcerative colitis Neg Hx    Liver disease Neg Hx    Pancreatic cancer Neg Hx    Inflammatory bowel disease Neg Hx    Rectal cancer Neg Hx    Past Surgical History:  Procedure Laterality Date   BILIARY STENT PLACEMENT N/A 03/19/2021   Procedure: BILIARY STENT PLACEMENT;  Surgeon: Rush Landmark Telford Nab., MD;  [facility]: [facility];  Service: Gastroenterology;  Laterality: N/A;   BIV ICD INSERTION CRT-D N/A 03/27/2017   Procedure: BIV ICD INSERTION CRT-D;  Surgeon: Constance Haw, MD;  Location: [place] [facility];  Service: Cardiovascular;  Laterality: N/A;   CARDIAC CATHETERIZATION     '05, last 2009, showing patent RCA stent   COLONOSCOPY  12/2007   HYPERPLASTIC POLYP   COLONOSCOPY  12/2019   COLONOSCOPY WITH PROPOFOL N/A 06/02/2014   Procedure: COLONOSCOPY WITH PROPOFOL;  Surgeon: [name], MD;  [facility];  Service: Endoscopy;  Laterality: N/A;   CORONARY ANGIOPLASTY  08/2003   ENDOSCOPIC MUCOSAL RESECTION  03/19/2021   Procedure: ENDOSCOPIC MUCOSAL RESECTION, ampullectomy;  Surgeon: Irving Copas., MD;  [facility];  Service: Gastroenterology;;   ENDOSCOPIC RETROGRADE CHOLANGIOPANCREATOGRAPHY (ERCP) WITH PROPOFOL N/A 03/19/2021   Procedure: ENDOSCOPIC RETROGRADE CHOLANGIOPANCREATOGRAPHY (ERCP) WITH PROPOFOL;  Surgeon: Irving Copas., MD;  Location: [facility];  Service: Gastroenterology;  Laterality: N/A;    ENDOSCOPIC RETROGRADE CHOLANGIOPANCREATOGRAPHY (ERCP) WITH PROPOFOL N/A 11/29/2021   Procedure: ENDOSCOPIC RETROGRADE CHOLANGIOPANCREATOGRAPHY (ERCP) WITH PROPOFOL;  Surgeon: Rush Landmark Telford Nab., MD;  Location: [facility];  Service: Gastroenterology;  Laterality: N/A;   ESOPHAGOGASTRODUODENOSCOPY (EGD) WITH PROPOFOL N/A 06/02/2014   Procedure: ESOPHAGOGASTRODUODENOSCOPY (EGD) WITH PROPOFOL;  Surgeon: [name], MD;  [facility];  Service: Endoscopy;  Laterality: N/A;   ESOPHAGOGASTRODUODENOSCOPY (EGD) WITH PROPOFOL N/A 03/19/2021   Procedure: ESOPHAGOGASTRODUODENOSCOPY (EGD) WITH PROPOFOL;  Surgeon: Rush Landmark Telford Nab., MD;  [facility];  Service: Gastroenterology;  Laterality: N/A;   ESOPHAGOGASTRODUODENOSCOPY (EGD) WITH PROPOFOL N/A 03/23/2021   Procedure: ESOPHAGOGASTRODUODENOSCOPY (EGD) WITH PROPOFOL;  Surgeon: Ladene Artist, MD;  [facility];  Service: Endoscopy;  Laterality: N/A;   hernia surgery x 3     Bilateral Inguinal, Umbicial   INSERTION OF MESH N/A 03/20/2018   Procedure: INSERTION OF MESH;  Surgeon: Alphonsa Overall, MD;  Location: [place] [facility];  Service: General;  Laterality: N/A;   IRRIGATION AND DEBRIDEMENT ABSCESS Left 08/14/2012   Procedure: IRRIGATION AND DEBRIDEMENT LEFT INGUINAL BOIL ;  Surgeon: Ailene Rud, MD;  [facility];  Service: Urology;  Laterality: Left;   JOINT REPLACEMENT Right 2017   KNEE ARTHROPLASTY     LEFT HEART CATH AND CORONARY ANGIOGRAPHY N/A 01/24/2019   Procedure: LEFT HEART CATH AND CORONARY ANGIOGRAPHY;  Surgeon: Martinique, Peter M, MD;  Location: [place] [facility];  Service: Cardiovascular;  Laterality: N/A;   LEFT HEART CATHETERIZATION WITH CORONARY ANGIOGRAM N/A 05/26/2014   Procedure: LEFT HEART CATHETERIZATION WITH CORONARY ANGIOGRAM;  Surgeon: Blane Ohara, MD;  Location: [facility];  Service: Cardiovascular;  Laterality: N/A;   LUMBAR LAMINECTOMY/DECOMPRESSION MICRODISCECTOMY Left 09/26/2014    Procedure: Lumbar Laminectomy for resection of synovial cyst  Lumbar five- sacral one left;  Surgeon: Kary Kos, MD;  Location: [facility];  Service: Neurosurgery;  Laterality: Left;   MOLE REMOVAL Left 03/20/2018   Procedure: MOLE REMOVAL;  Surgeon: Alphonsa Overall, MD;  Location: [facility];  Service: General;  Laterality: Left;   Oral surg to removed growth from ?sinus  10/2010   ?Dermoid removed by DrRiggs   PACEMAKER INSERTION  04/03/2017   PANCREATIC STENT PLACEMENT  03/19/2021   Procedure: PANCREATIC STENT PLACEMENT;  Surgeon: Irving Copas., MD;  [facility];  Service: Gastroenterology;;   POLYPECTOMY  RCA stenting     '05 RCA   REMOVAL OF STONES  11/29/2021   Procedure: REMOVAL OF SLUDGE;  Surgeon: Mansouraty, Telford Nab., MD;  Location: [facility];  Service: Gastroenterology;;   right toe surgery  Right    Cyst    RIGHT/LEFT HEART CATH AND CORONARY ANGIOGRAPHY N/A 01/15/2017   Procedure: RIGHT/LEFT HEART CATH AND CORONARY ANGIOGRAPHY;  Surgeon: [name], MD;  Location: [facility];  Service: Cardiovascular;  Laterality: N/A;   s/p right knee arthroscopy  2005   SPHINCTEROTOMY  03/19/2021   Procedure: SPHINCTEROTOMY;  Surgeon: Mansouraty, Telford Nab., MD;  Location: [facility];  Service: Gastroenterology;;   Lavell Islam REMOVAL  11/29/2021   Procedure: STENT REMOVAL;  Surgeon: Irving Copas., MD;  Location: [place];  Service: Gastroenterology;;   SUBMUCOSAL LIFTING INJECTION  03/19/2021   Procedure: SUBMUCOSAL LIFTING INJECTION;  Surgeon: Irving Copas., MD;  Location: [facility];  Service: Gastroenterology;;   thumb surgery Right    TOTAL KNEE ARTHROPLASTY Right 03/12/2016   Procedure: RIGHT TOTAL KNEE ARTHROPLASTY;  Surgeon: Paralee Cancel, MD;  [facility];  Service: Orthopedics;  Laterality: Right;   TOTAL KNEE ARTHROPLASTY Left 12/10/2021   Procedure: TOTAL KNEE ARTHROPLASTY;  Surgeon: Gaynelle Arabian, MD;  [facility];   Service: Orthopedics;  Laterality: Left;   UPPER ESOPHAGEAL ENDOSCOPIC ULTRASOUND (EUS) N/A 03/19/2021   Procedure: UPPER ESOPHAGEAL ENDOSCOPIC ULTRASOUND (EUS);  Surgeon: Irving Copas., MD;  Location: [facility];  Service: Gastroenterology;  Laterality: N/A;   UPPER GASTROINTESTINAL ENDOSCOPY     VENTRAL HERNIA REPAIR N/A 03/20/2018   Procedure: LAPAROSCOPIC VENTRAL INCISIONAL  HERNIA ERAS PATHWAY;  Surgeon: Alphonsa Overall, MD;  Location: [facility] [place];  Service: General;  Laterality: N/A;   Social History   Social History Narrative   Not on file   Immunization History  Administered Date(s) Administered   Fluad Quad(high Dose 65+) 02/18/2019   Influenza Split 03/21/2011   Influenza Whole 04/06/2009, 04/09/2010   Influenza, High Dose Seasonal PF 04/20/2015, 04/15/2016, 02/21/2017   Influenza,inj,Quad PF,6+ Mos 03/24/2014   Influenza-Unspecified 02/26/2018, 02/18/2019, 02/26/2020   PFIZER(Purple Top)SARS-COV-2 Vaccination 05/27/2019, 06/17/2019, 02/15/2020   Pneumococcal Conjugate-13 11/29/2014   Pneumococcal Polysaccharide-23 06/25/2018   Tdap 11/29/2014   Zoster Recombinat (Shingrix) 06/07/2018, 09/26/2018   Zoster, Live 05/20/2006     Objective: Vital Signs: BP 126/72 (BP Location: Left Arm, Patient Position: Sitting, Cuff Size: Normal)   Pulse 76   Resp 14   Ht '5\' 7"'$  (1.702 m)   Wt 183 lb 6.4 oz (83.2 kg)   BMI 28.72 kg/m    Physical Exam Vitals and nursing note reviewed.  Constitutional:      Appearance: He is well-developed.  HENT:     Head: Normocephalic and atraumatic.  Eyes:     Conjunctiva/sclera: Conjunctivae normal.     Pupils: Pupils are equal, round, and reactive to light.  Cardiovascular:     Rate and Rhythm: Normal rate and regular rhythm.     Heart sounds: Normal heart sounds.     Comments: Pacemaker Pulmonary:     Effort: Pulmonary effort is normal.     Breath sounds: Normal breath sounds.  Abdominal:     General: Bowel sounds are normal.      Palpations: Abdomen is soft.  Musculoskeletal:     Cervical back: Normal range of motion and neck supple.  Skin:    General: Skin is warm and dry.     Capillary Refill: Capillary refill takes less than  2 seconds.  Neurological:     Mental Status: He is alert and oriented to person, place, and time.  Psychiatric:        Behavior: Behavior normal.      Musculoskeletal Exam: He had some limitation with lateral rotation of the cervical spine.  He had discomfort range of motion of the lumbar spine.  Shoulder joints elbow joints and wrist joints in good range of motion.  He had bilateral MCP thickening with no synovitis.  He had PIP and DIP thickening with subluxation of several of his PIP and DIP joints without any synovitis.  Hip joints in good range of motion.  He had tenderness over left trochanteric bursa.  Right knee joint was replaced which was in good range of motion.  Left knee joint was replaced with limited extension and warmth on palpation.  There was no tenderness over ankles or MTPs.  CDAI Exam: CDAI Score: -- Patient Global: 3 mm; Provider Global: 3 mm Swollen: --; Tender: -- Joint Exam 02/19/2022   No joint exam has been documented for this visit   There is currently no information documented on the homunculus. Go to the Rheumatology activity and complete the homunculus joint exam.  Investigation: No additional findings.  Imaging: CUP PACEART REMOTE DEVICE CHECK  Result Date: 01/31/2022 Scheduled remote reviewed. Normal device function.  Next remote 91 days. Kathy Breach, RN, CCDS, [employer]   Recent Labs: Lab Results  Component Value Date   WBC 15.3 (H) 12/11/2021   HGB 11.3 (L) 12/11/2021   PLT 219 12/11/2021   NA 138 12/11/2021   K 4.2 12/11/2021   CL 109 12/11/2021   CO2 23 12/11/2021   GLUCOSE 156 (H) 12/11/2021   BUN 19 12/11/2021   CREATININE 0.89 12/11/2021   BILITOT 1.0 09/27/2021   ALKPHOS 54 09/27/2021   AST 17 09/27/2021   ALT 15  09/27/2021   PROT 6.2 09/27/2021   ALBUMIN 4.2 09/27/2021   CALCIUM 8.8 (L) 12/11/2021   GFRAA 100 11/09/2020    Speciality Comments: PLQ eye exam: 03/14/2021 normal. [facility] Follow up 09/12/2021.  Procedures:  No procedures performed Allergies: Sulfonamide derivatives   Assessment / Plan:     Visit Diagnoses: Rheumatoid arthritis involving multiple sites with positive rheumatoid factor (HCC) - CCP +, RF -, 14-3-3 eta negative, Sed rate WNL.  He has severe erosive disease but now burned-out RA.  He had no active synovitis.  He does well on low-dose hydroxychloroquine 200 mg p.o. daily Monday to Friday.  He had no synovitis on examination.  He denies any history of joint swelling.  Continues to have pain and discomfort and morning stiffness due to underlying osteoarthritis.  High risk medication use - PLQ 200 mg 1 tablet po Monday to Friday. PLQ eye exam: 03/14/21 -Labs obtained on Sep 27, 2021 were reviewed which were stable.  Annual eye examination was advised.  Plan: CBC with Differential/Platelet, COMPLETE METABOLIC PANEL WITH GFR today and every 5 months.  Information on immunization was placed in the AVS.  Medication monitoring encounter -he has been taking tramadol '50mg'$  by mouth at bedtime as needed for pain.  He states he is unable to sleep without tramadol due to pain and discomfort in multiple joints.  We will check UDS today.  UDS: 09/18/2021, narcotic agreement: 04/19/2021.  Therapeutic drug monitoring - Plan: DRUG MONITOR, TRAMADOL,QN, URINE, DRUG MONITOR, PANEL 5, W/CONF, URINE  Primary osteoarthritis of both hands - severe osteoarthritis and rheumatoid arthritis  overlap.  He has synovial thickening but no synovitis.  He also has severe PIP and DIP narrowing with subluxation of PIP and DIP joints without synovitis.  He has chronic discomfort.  Status post bilateral total knee replacement-he had right total knee replacement in the past which was in good range of motion  without any warmth swelling or effusion.  He had left total knee replacement in July.  He still had warmth and swelling and limited extension.  He had physical therapy and had been doing some exercises at home.  Primary osteoarthritis of both feet - he is followed by Dr. Doran Durand.  He states Dr. Doran Durand suggested first MTP surgery.  He has chronic discomfort in his feet.  Trochanteric bursitis, left hip-redness on palpation over left trochanteric bursa.  IT band stretches were discussed.  Chronic left SI joint pain-he has intermittent pain.  DDD (degenerative disc disease), lumbar-[place] discomfort in his lumbar spine.  Other medical problems listed as follows:  Height loss - he states that he lost 2-1/2 inches of his height.  Osteopenia of multiple sites - May 02, 2021 DEXA scan T score -1.6, BMD 0.706 left femoral neck  History of coronary artery disease  History of CHF (congestive heart failure)  History of hypertension  History of gastroesophageal reflux (GERD)  Pacemaker - He is on Eliquis  History of hyperlipidemia  History of BPH  History of colon polyps  History of gastric polyp  History of sleep apnea  Orders: Orders Placed This Encounter  Procedures   CBC with Differential/Platelet   COMPLETE METABOLIC PANEL WITH GFR   DRUG MONITOR, TRAMADOL,QN, URINE   DRUG MONITOR, PANEL 5, W/CONF, URINE   No orders of the defined types were placed in this encounter.    Follow-Up Instructions: Return in about 5 months (around 07/21/2022) for Rheumatoid arthritis, Osteoarthritis.   Bo Merino, MD  Note - This record has been created using Editor, commissioning.  Chart creation errors have been sought, but may not always  have been located. Such creation errors do not reflect on  the standard of medical care.

## 2022-02-06 ENCOUNTER — Other Ambulatory Visit: Payer: Self-pay | Admitting: Rheumatology

## 2022-02-06 NOTE — Telephone Encounter (Signed)
Next Visit: 02/19/2022   Last Visit: 09/18/2021   UDS:09/18/2021, UDS is c/w  Tramadol use.   Narc Agreement: 04/19/2021   Last Fill: 01/09/2022 Tramadol '50mg'$  by mouth at bedtime as needed.   Okay to refill Tramadol?

## 2022-02-10 ENCOUNTER — Other Ambulatory Visit: Payer: Self-pay | Admitting: Cardiovascular Disease

## 2022-02-11 ENCOUNTER — Ambulatory Visit (INDEPENDENT_AMBULATORY_CARE_PROVIDER_SITE_OTHER): Payer: Medicare Other

## 2022-02-11 DIAGNOSIS — Z95810 Presence of automatic (implantable) cardiac defibrillator: Secondary | ICD-10-CM

## 2022-02-11 DIAGNOSIS — I5022 Chronic systolic (congestive) heart failure: Secondary | ICD-10-CM

## 2022-02-11 NOTE — Progress Notes (Unsigned)
EPIC Encounter for ICM Monitoring  Patient Name: KALUP JAQUITH is a 76 y.o. male Date: 02/11/2022 Primary Care Physican: Biagio Borg, MD Primary Cardiologist: Burt Knack Electrophysiologist: Curt Bears Bi-V Pacing:   96%  Effective 95.4%      07/04/2021 Weight: 197 lbs  10/17/2021 Weight: 190 lbs 11/21/2021 Weight: 190 lbs   Time in AT/AF  0.0 hr/day (0.0%)                                                            Attempted call to patient and unable to reach.  Left detailed message per DPR regarding transmission. Transmission reviewed.    Diet: Consisting of mostly restaurant foods for past 6 weeks after knee surgery.  Optivol thoracic impedance continues to suggest minimal amount of possible fluid accumulation starting 9/4 but trending very close to baseline.   Prescribed: Spironolactone 25 mg take 0.5 tablet daily.   Labs: 09/27/2021 Creatinine 0.88, BUN 16, Potassium 3.7, Sodium 140, GFR 82.91 09/18/2021 Creatinine 0.86, BUN 16, Potassium 4.3, Sodium 141   Recommendations:  Left voice mail with ICM number and encouraged to call if experiencing any fluid symptoms.   Follow-up plan: ICM clinic phone appointment on 03/11/2022.   91 day device clinic remote transmission 05/02/2022.       EP/Cardiology Office Visits:   05/01/2022 with Dr Burt Knack.     Copy of ICM check sent to Dr. Curt Bears.   3 month ICM trend: 02/11/2022.    12-14 Month ICM trend:     [name], RN 02/11/2022 2:25 PM

## 2022-02-12 ENCOUNTER — Telehealth: Payer: Self-pay

## 2022-02-12 NOTE — Telephone Encounter (Signed)
Remote ICM transmission received.  Attempted call to patient regarding ICM remote transmission and left detailed message per DPR.  Advised to return call for any fluid symptoms or questions. Next ICM remote transmission scheduled 03/11/2022.    

## 2022-02-13 DIAGNOSIS — L812 Freckles: Secondary | ICD-10-CM | POA: Diagnosis not present

## 2022-02-13 DIAGNOSIS — L570 Actinic keratosis: Secondary | ICD-10-CM | POA: Diagnosis not present

## 2022-02-13 DIAGNOSIS — L821 Other seborrheic keratosis: Secondary | ICD-10-CM | POA: Diagnosis not present

## 2022-02-13 NOTE — Progress Notes (Signed)
Remote ICD transmission.   

## 2022-02-14 DIAGNOSIS — Z23 Encounter for immunization: Secondary | ICD-10-CM | POA: Diagnosis not present

## 2022-02-19 ENCOUNTER — Ambulatory Visit: Payer: Medicare Other | Attending: Rheumatology | Admitting: Rheumatology

## 2022-02-19 ENCOUNTER — Encounter: Payer: Self-pay | Admitting: Rheumatology

## 2022-02-19 VITALS — BP 126/72 | HR 76 | Resp 14 | Ht 67.0 in | Wt 183.4 lb

## 2022-02-19 DIAGNOSIS — Z8639 Personal history of other endocrine, nutritional and metabolic disease: Secondary | ICD-10-CM | POA: Diagnosis not present

## 2022-02-19 DIAGNOSIS — Z5181 Encounter for therapeutic drug level monitoring: Secondary | ICD-10-CM | POA: Diagnosis not present

## 2022-02-19 DIAGNOSIS — Z8669 Personal history of other diseases of the nervous system and sense organs: Secondary | ICD-10-CM

## 2022-02-19 DIAGNOSIS — G8929 Other chronic pain: Secondary | ICD-10-CM | POA: Diagnosis not present

## 2022-02-19 DIAGNOSIS — M533 Sacrococcygeal disorders, not elsewhere classified: Secondary | ICD-10-CM | POA: Diagnosis not present

## 2022-02-19 DIAGNOSIS — M8589 Other specified disorders of bone density and structure, multiple sites: Secondary | ICD-10-CM

## 2022-02-19 DIAGNOSIS — Z96651 Presence of right artificial knee joint: Secondary | ICD-10-CM

## 2022-02-19 DIAGNOSIS — Z8679 Personal history of other diseases of the circulatory system: Secondary | ICD-10-CM | POA: Diagnosis not present

## 2022-02-19 DIAGNOSIS — Z79899 Other long term (current) drug therapy: Secondary | ICD-10-CM

## 2022-02-19 DIAGNOSIS — Z86010 Personal history of colonic polyps: Secondary | ICD-10-CM

## 2022-02-19 DIAGNOSIS — Z87438 Personal history of other diseases of male genital organs: Secondary | ICD-10-CM

## 2022-02-19 DIAGNOSIS — R29890 Loss of height: Secondary | ICD-10-CM | POA: Diagnosis not present

## 2022-02-19 DIAGNOSIS — Z96653 Presence of artificial knee joint, bilateral: Secondary | ICD-10-CM | POA: Diagnosis not present

## 2022-02-19 DIAGNOSIS — M5136 Other intervertebral disc degeneration, lumbar region: Secondary | ICD-10-CM | POA: Diagnosis not present

## 2022-02-19 DIAGNOSIS — M19071 Primary osteoarthritis, right ankle and foot: Secondary | ICD-10-CM | POA: Diagnosis not present

## 2022-02-19 DIAGNOSIS — Z950 Presence of cardiac pacemaker: Secondary | ICD-10-CM

## 2022-02-19 DIAGNOSIS — M19041 Primary osteoarthritis, right hand: Secondary | ICD-10-CM

## 2022-02-19 DIAGNOSIS — M19072 Primary osteoarthritis, left ankle and foot: Secondary | ICD-10-CM

## 2022-02-19 DIAGNOSIS — M0579 Rheumatoid arthritis with rheumatoid factor of multiple sites without organ or systems involvement: Secondary | ICD-10-CM | POA: Diagnosis not present

## 2022-02-19 DIAGNOSIS — M19042 Primary osteoarthritis, left hand: Secondary | ICD-10-CM | POA: Diagnosis not present

## 2022-02-19 DIAGNOSIS — Z8719 Personal history of other diseases of the digestive system: Secondary | ICD-10-CM

## 2022-02-19 DIAGNOSIS — M1712 Unilateral primary osteoarthritis, left knee: Secondary | ICD-10-CM

## 2022-02-19 DIAGNOSIS — M7062 Trochanteric bursitis, left hip: Secondary | ICD-10-CM | POA: Diagnosis not present

## 2022-02-19 DIAGNOSIS — I2510 Atherosclerotic heart disease of native coronary artery without angina pectoris: Secondary | ICD-10-CM

## 2022-02-19 NOTE — Patient Instructions (Signed)
Vaccines You are taking a medication(s) that can suppress your immune system.  The following immunizations are recommended: Flu annually Covid-19  Td/Tdap (tetanus, diphtheria, pertussis) every 10 years Pneumonia (Prevnar 15 then Pneumovax 23 at least 1 year apart.  Alternatively, can take Prevnar 20 without needing additional dose) Shingrix: 2 doses from 4 weeks to 6 months apart  Please check with your PCP to make sure you are up to date.  

## 2022-02-20 NOTE — Progress Notes (Signed)
CBC and CMP normal

## 2022-02-21 LAB — COMPLETE METABOLIC PANEL WITH GFR
AG Ratio: 1.8 (calc) (ref 1.0–2.5)
ALT: 15 U/L (ref 9–46)
AST: 18 U/L (ref 10–35)
Albumin: 4.6 g/dL (ref 3.6–5.1)
Alkaline phosphatase (APISO): 79 U/L (ref 35–144)
BUN: 13 mg/dL (ref 7–25)
CO2: 28 mmol/L (ref 20–32)
Calcium: 9.9 mg/dL (ref 8.6–10.3)
Chloride: 104 mmol/L (ref 98–110)
Creat: 0.88 mg/dL (ref 0.70–1.28)
Globulin: 2.5 g/dL (calc) (ref 1.9–3.7)
Glucose, Bld: 99 mg/dL (ref 65–99)
Potassium: 4.4 mmol/L (ref 3.5–5.3)
Sodium: 140 mmol/L (ref 135–146)
Total Bilirubin: 0.7 mg/dL (ref 0.2–1.2)
Total Protein: 7.1 g/dL (ref 6.1–8.1)
eGFR: 89 mL/min/{1.73_m2} (ref >=60)

## 2022-02-21 LAB — DRUG MONITOR, PANEL 5, W/CONF, URINE
Amphetamines: NEGATIVE ng/mL (ref <500)
Barbiturates: NEGATIVE ng/mL (ref <300)
Benzodiazepines: NEGATIVE ng/mL (ref <100)
Cocaine Metabolite: NEGATIVE ng/mL (ref <150)
Creatinine: 279.9 mg/dL (ref >=20.0)
Marijuana Metabolite: NEGATIVE ng/mL (ref <20)
Methadone Metabolite: NEGATIVE ng/mL (ref <100)
Opiates: NEGATIVE ng/mL (ref <100)
Oxidant: NEGATIVE ug/mL (ref <200)
Oxycodone: NEGATIVE ng/mL (ref <100)
pH: 6.3 (ref 4.5–9.0)

## 2022-02-21 LAB — CBC WITH DIFFERENTIAL/PLATELET
Absolute Monocytes: 510 cells/uL (ref 200–950)
Basophils Absolute: 50 cells/uL (ref 0–200)
Basophils Relative: 0.9 %
Eosinophils Absolute: 168 cells/uL (ref 15–500)
Eosinophils Relative: 3 %
HCT: 42.4 % (ref 38.5–50.0)
Hemoglobin: 13.8 g/dL (ref 13.2–17.1)
Lymphs Abs: 1557 cells/uL (ref 850–3900)
MCH: 29.6 pg (ref 27.0–33.0)
MCHC: 32.5 g/dL (ref 32.0–36.0)
MCV: 90.8 fL (ref 80.0–100.0)
MPV: 9.9 fL (ref 7.5–12.5)
Monocytes Relative: 9.1 %
Neutro Abs: 3315 cells/uL (ref 1500–7800)
Neutrophils Relative %: 59.2 %
Platelets: 307 10*3/uL (ref 140–400)
RBC: 4.67 10*6/uL (ref 4.20–5.80)
RDW: 12.7 % (ref 11.0–15.0)
Total Lymphocyte: 27.8 %
WBC: 5.6 10*3/uL (ref 3.8–10.8)

## 2022-02-21 LAB — DRUG MONITOR, TRAMADOL,QN, URINE
Desmethyltramadol: 4389 ng/mL — ABNORMAL HIGH (ref <100)
Tramadol: 4181 ng/mL — ABNORMAL HIGH (ref <100)

## 2022-02-21 LAB — DM TEMPLATE

## 2022-02-21 NOTE — Progress Notes (Signed)
UDS consistent with tramadol use.

## 2022-02-27 DIAGNOSIS — E291 Testicular hypofunction: Secondary | ICD-10-CM | POA: Diagnosis not present

## 2022-03-06 DIAGNOSIS — N401 Enlarged prostate with lower urinary tract symptoms: Secondary | ICD-10-CM | POA: Diagnosis not present

## 2022-03-06 DIAGNOSIS — E291 Testicular hypofunction: Secondary | ICD-10-CM | POA: Diagnosis not present

## 2022-03-06 DIAGNOSIS — R3915 Urgency of urination: Secondary | ICD-10-CM | POA: Diagnosis not present

## 2022-03-07 DIAGNOSIS — H53021 Refractive amblyopia, right eye: Secondary | ICD-10-CM | POA: Diagnosis not present

## 2022-03-07 DIAGNOSIS — M0569 Rheumatoid arthritis of multiple sites with involvement of other organs and systems: Secondary | ICD-10-CM | POA: Diagnosis not present

## 2022-03-07 DIAGNOSIS — Z79899 Other long term (current) drug therapy: Secondary | ICD-10-CM | POA: Diagnosis not present

## 2022-03-11 ENCOUNTER — Ambulatory Visit (INDEPENDENT_AMBULATORY_CARE_PROVIDER_SITE_OTHER): Payer: Medicare Other

## 2022-03-11 ENCOUNTER — Other Ambulatory Visit: Payer: Self-pay | Admitting: Rheumatology

## 2022-03-11 DIAGNOSIS — Z95810 Presence of automatic (implantable) cardiac defibrillator: Secondary | ICD-10-CM

## 2022-03-11 DIAGNOSIS — I5022 Chronic systolic (congestive) heart failure: Secondary | ICD-10-CM

## 2022-03-11 NOTE — Telephone Encounter (Signed)
Next Visit: 07/23/2022  Last Visit: 02/19/2022  UDS:02/19/2022  Narc Agreement: 02/19/2022  Last Fill: 02/06/2022 tramadol '50mg'$  by mouth at bedtime as needed for pain.  Okay to refill Tramadol?

## 2022-03-12 ENCOUNTER — Telehealth: Payer: Self-pay

## 2022-03-12 NOTE — Telephone Encounter (Signed)
Remote ICM transmission received.  Attempted call to patient regarding ICM remote transmission and left detailed message per DPR.  Advised to return call for any fluid symptoms or questions. Next ICM remote transmission scheduled 04/15/2022.

## 2022-03-12 NOTE — Progress Notes (Signed)
EPIC Encounter for ICM Monitoring  Patient Name: [name] is a 76 y.o. male Date: 03/12/2022 Primary Care Physican: Biagio Borg, MD Primary Cardiologist: Burt Knack Electrophysiologist: Curt Bears Bi-V Pacing:   97.4%  Effective 95.2%      07/04/2021 Weight: 197 lbs  10/17/2021 Weight: 190 lbs 11/21/2021 Weight: 190 lbs   Time in AT/AF  0.0 hr/day (0.0%)                                                            Attempted call to patient and unable to reach.  Left detailed message per DPR regarding transmission. Transmission reviewed.   Optivol thoracic impedance suggesting normal fluid levels.   Prescribed: Spironolactone 25 mg take 0.5 tablet daily.   Labs: 09/27/2021 Creatinine 0.88, BUN 16, Potassium 3.7, Sodium 140, GFR 82.91 09/18/2021 Creatinine 0.86, BUN 16, Potassium 4.3, Sodium 141   Recommendations: Left voice mail with ICM number and encouraged to call if experiencing any fluid symptoms.   Follow-up plan: ICM clinic phone appointment on 04/15/2022.   91 day device clinic remote transmission 05/02/2022.       EP/Cardiology Office Visits:   05/01/2022 with Dr Burt Knack.     Copy of ICM check sent to Dr. Curt Bears.   3 month ICM trend: 03/11/2022.    12-14 Month ICM trend:     [name], RN 03/12/2022 8:19 AM

## 2022-03-14 DIAGNOSIS — Z23 Encounter for immunization: Secondary | ICD-10-CM | POA: Diagnosis not present

## 2022-04-09 ENCOUNTER — Other Ambulatory Visit: Payer: Self-pay | Admitting: Physician Assistant

## 2022-04-09 NOTE — Telephone Encounter (Signed)
Next Visit: 07/23/2022   Last Visit: 02/19/2022   UDS:02/19/2022   Narc Agreement: 02/19/2022   Last Fill: 03/11/2022 tramadol '50mg'$  by mouth at bedtime as needed for pain.   Okay to refill Tramadol?

## 2022-04-15 ENCOUNTER — Ambulatory Visit (INDEPENDENT_AMBULATORY_CARE_PROVIDER_SITE_OTHER): Payer: Medicare Other

## 2022-04-15 DIAGNOSIS — Z95810 Presence of automatic (implantable) cardiac defibrillator: Secondary | ICD-10-CM

## 2022-04-15 DIAGNOSIS — I5022 Chronic systolic (congestive) heart failure: Secondary | ICD-10-CM

## 2022-04-15 NOTE — Progress Notes (Signed)
EPIC Encounter for ICM Monitoring  Patient Name: [name] is a 76 y.o. male Date: 04/15/2022 Primary Care Physican: Biagio Borg, MD Primary Cardiologist: Burt Knack Electrophysiologist: Curt Bears Bi-V Pacing:   95.1%  Effective 94.5%      07/04/2021 Weight: 197 lbs  10/17/2021 Weight: 190 lbs 11/21/2021 Weight: 190 lbs 04/15/2022 Weight: 190 lbs   Time in AT/AF  0.0 hr/day (0.0%)                                                            Spoke with patient and heart failure questions reviewed.  Transmission results reviewed.  Pt reports swelling around ankles with indentation after removing socks.  He has been eating foods high in salt over the last week.   Optivol thoracic impedance suggesting possible fluid accumulation starting 11/21.   Prescribed: Spironolactone 25 mg take 0.5 tablet daily.   Labs: 02/19/2022 Creatinine 0.88, BUN 13, Potassium 4.4, Sodium 140, GFR 89 12/11/2021 Creatinine 0.89, BUN 19, Potassium 4.2, Sodium 138, GFR >60  11/26/2021 Creatinine 0.92, BUN 15, Potassium 3.9, Sodium 141, GFR >60  09/27/2021 Creatinine 0.88, BUN 16, Potassium 3.7, Sodium 140, GFR 82.91 09/18/2021 Creatinine 0.86, BUN 16, Potassium 4.3, Sodium 141   Recommendations: Recommendation to limit salt intake to 2000 mg daily and fluid intake to 64 oz daily.  Encouraged to call if experiencing any fluid symptoms.    Follow-up plan: ICM clinic phone appointment on 04/29/2022 to recheck fluid levels.   91 day device clinic remote transmission 05/02/2022.       EP/Cardiology Office Visits:   05/01/2022 with Dr Burt Knack.     Copy of ICM check sent to Dr. Curt Bears and Dr Burt Knack for review and recommendations if needed.    3 month ICM trend: 04/15/2022.    12-14 Month ICM trend:     [name], RN 04/15/2022 10:45 AM

## 2022-04-19 NOTE — Progress Notes (Signed)
No recommendations received from Dr Burt Knack at this time.

## 2022-04-29 ENCOUNTER — Ambulatory Visit (INDEPENDENT_AMBULATORY_CARE_PROVIDER_SITE_OTHER): Payer: Medicare Other

## 2022-04-29 DIAGNOSIS — Z95810 Presence of automatic (implantable) cardiac defibrillator: Secondary | ICD-10-CM

## 2022-04-29 DIAGNOSIS — I5022 Chronic systolic (congestive) heart failure: Secondary | ICD-10-CM

## 2022-04-30 ENCOUNTER — Telehealth: Payer: Self-pay

## 2022-04-30 NOTE — Telephone Encounter (Signed)
Remote ICM transmission received.  Attempted call to patient regarding ICM remote transmission and left detailed message per DPR.  Advised to return call for any fluid symptoms or questions.  

## 2022-04-30 NOTE — Progress Notes (Signed)
EPIC Encounter for ICM Monitoring  Patient Name: [name] is a 76 y.o. male Date: 04/30/2022 Primary Care Physican: Biagio Borg, MD Primary Cardiologist: Burt Knack Electrophysiologist: Curt Bears Bi-V Pacing:   97.8%  Effective 97.3%      07/04/2021 Weight: 197 lbs  10/17/2021 Weight: 190 lbs 11/21/2021 Weight: 190 lbs 04/15/2022 Weight: 190 lbs   Time in AT/AF  0.0 hr/day (0.0%)                                                            Attempted call to patient and unable to reach.  Left detailed message per DPR regarding transmission. Transmission reviewed.    Optivol thoracic impedance suggesting fluid levels returned to normal.   Prescribed: Spironolactone 25 mg take 0.5 tablet daily.   Labs: 02/19/2022 Creatinine 0.88, BUN 13, Potassium 4.4, Sodium 140, GFR 89 12/11/2021 Creatinine 0.89, BUN 19, Potassium 4.2, Sodium 138, GFR >60  11/26/2021 Creatinine 0.92, BUN 15, Potassium 3.9, Sodium 141, GFR >60  09/27/2021 Creatinine 0.88, BUN 16, Potassium 3.7, Sodium 140, GFR 82.91 09/18/2021 Creatinine 0.86, BUN 16, Potassium 4.3, Sodium 141   Recommendations:  Left voice mail with ICM number and encouraged to call if experiencing any fluid symptoms.   Follow-up plan: ICM clinic phone appointment on 06/10/2022.   91 day device clinic remote transmission 08/01/2022.       EP/Cardiology Office Visits:   05/01/2022 with Dr Burt Knack.  Recall 07/07/2022 with Dr Burt Knack.   Copy of ICM check sent to Dr. Curt Bears.  3 month ICM trend: 04/29/2022.    12-14 Month ICM trend:     [name], RN 04/30/2022 10:57 AM

## 2022-05-01 ENCOUNTER — Other Ambulatory Visit: Payer: Self-pay | Admitting: Rheumatology

## 2022-05-01 ENCOUNTER — Ambulatory Visit: Payer: Medicare Other | Attending: Cardiovascular Disease | Admitting: Cardiovascular Disease

## 2022-05-01 ENCOUNTER — Encounter: Payer: Self-pay | Admitting: Cardiovascular Disease

## 2022-05-01 ENCOUNTER — Other Ambulatory Visit: Payer: Self-pay | Admitting: Cardiovascular Disease

## 2022-05-01 VITALS — BP 110/70 | HR 85 | Ht 67.0 in | Wt 193.6 lb

## 2022-05-01 DIAGNOSIS — Z95810 Presence of automatic (implantable) cardiac defibrillator: Secondary | ICD-10-CM | POA: Insufficient documentation

## 2022-05-01 DIAGNOSIS — I2510 Atherosclerotic heart disease of native coronary artery without angina pectoris: Secondary | ICD-10-CM | POA: Insufficient documentation

## 2022-05-01 DIAGNOSIS — I480 Paroxysmal atrial fibrillation: Secondary | ICD-10-CM | POA: Diagnosis not present

## 2022-05-01 DIAGNOSIS — M1990 Unspecified osteoarthritis, unspecified site: Secondary | ICD-10-CM

## 2022-05-01 DIAGNOSIS — I5022 Chronic systolic (congestive) heart failure: Secondary | ICD-10-CM | POA: Diagnosis not present

## 2022-05-01 MED ORDER — PANTOPRAZOLE SODIUM 40 MG PO TBEC: 40.0000 mg | DELAYED_RELEASE_TABLET | Freq: Every day | ORAL | 3 refills | Status: DC

## 2022-05-01 NOTE — Progress Notes (Signed)
Cardiology Office Note:    Date:  05/01/2022   ID:  [name], DOB 1946/02/14, MRN [id number]  PCP:  Biagio Borg, MD   [place] Providers Cardiologist:  [name], MD Electrophysiologist:  Constance Haw, MD     Referring MD: Biagio Borg, MD   Chief Complaint  Patient presents with   Coronary Artery Disease    History of Present Illness:    [name] is a 76 y.o. male with a hx of  chronic systolic heart failure with left bundle branch block, coronary artery disease with remote stenting of the RCA, and paroxysmal atrial fibrillation.  He presents for follow-up evaluation today.  He underwent CRT-D implantation in 2018.  He was ultimately found to have atrial fibrillation on remote device monitoring and was started on anticoagulation with apixaban.  His antiplatelet therapy was stopped at that time.  LV function normalized after cardiac resynchronization.   The patient is here alone today.  He reports stable symptoms of exertional dyspnea with climbing stairs or walking at a brisk pace.  He is not symptomatic at normal pace.  He has had knee replacement surgery and has been pretty sedentary over the last several years.  He attributes his symptoms to aging and sedentary lifestyle/deconditioning.  He denies chest pain or pressure.  No edema, orthopnea, or PND.  No lightheadedness or syncope.  No bleeding problems on Eliquis.  He is compliant with his medications.  Past Medical History:  Diagnosis Date   AICD (automatic cardioverter/defibrillator) present    Allergic rhinitis    Allergy    Anginal pain ([place]) 05/26/14   chest pain after chasing dog   Atypical nevus of back 04/27/2003   moderate - mid lower back   Basal cell carcinoma 01/26/2008   right cheek - MOHs   CAD (coronary artery disease)    hx of stent- 2005 RCA   Cataract    beginning stage both eyes   CHF (congestive heart failure) (HCC)    pacemaker Medtronic     Chronic back pain     intermittent   Constipation    Dizziness    Dysrhythmia    right bundle branch block    Esophageal stricture    GERD (gastroesophageal reflux disease)    Hemoptysis    Hiatal hernia    History of colonic polyps    hyperplastic   HTN (hypertension)    Hyperlipidemia    Hypertrophy of prostate with urinary obstruction and other lower urinary tract symptoms (LUTS)    OA (osteoarthritis)    OSA (obstructive sleep apnea)    cpap- 10    Other specified disorder of stomach and duodenum    duodenal periampulary tubulovillous adenoma removed by Dr. Ardis Hughs 5/10   Pericarditis 07/06/2019   Pneumonia    Pre-diabetes    no medications   Shortness of breath    with exertion   Sleep apnea    cpap   Testicular hypofunction     Past Surgical History:  Procedure Laterality Date   BILIARY STENT PLACEMENT N/A 03/19/2021   Procedure: BILIARY STENT PLACEMENT;  Surgeon: Irving Copas., MD;  Location: [facility];  Service: Gastroenterology;  Laterality: N/A;   BIV ICD INSERTION CRT-D N/A 03/27/2017   Procedure: BIV ICD INSERTION CRT-D;  Surgeon: Constance Haw, MD;  Location: [facility];  Service: Cardiovascular;  Laterality: N/A;   CARDIAC CATHETERIZATION     '05, last 2009, showing patent  RCA stent   COLONOSCOPY  12/2007   HYPERPLASTIC POLYP   COLONOSCOPY  12/2019   COLONOSCOPY WITH PROPOFOL N/A 06/02/2014   Procedure: COLONOSCOPY WITH PROPOFOL;  Surgeon: [name], MD;  Location: [facility];  Service: Endoscopy;  Laterality: N/A;   CORONARY ANGIOPLASTY  08/2003   ENDOSCOPIC MUCOSAL RESECTION  03/19/2021   Procedure: ENDOSCOPIC MUCOSAL RESECTION, ampullectomy;  Surgeon: Irving Copas., MD;  Location: [facility];  Service: Gastroenterology;;   ENDOSCOPIC RETROGRADE CHOLANGIOPANCREATOGRAPHY (ERCP) WITH PROPOFOL N/A 03/19/2021   Procedure: ENDOSCOPIC RETROGRADE CHOLANGIOPANCREATOGRAPHY (ERCP) WITH PROPOFOL;  Surgeon: Irving Copas., MD;   Location: [facility];  Service: Gastroenterology;  Laterality: N/A;   ENDOSCOPIC RETROGRADE CHOLANGIOPANCREATOGRAPHY (ERCP) WITH PROPOFOL N/A 11/29/2021   Procedure: ENDOSCOPIC RETROGRADE CHOLANGIOPANCREATOGRAPHY (ERCP) WITH PROPOFOL;  Surgeon: Rush Landmark Telford Nab., MD;  Location: [facility];  Service: Gastroenterology;  Laterality: N/A;   ESOPHAGOGASTRODUODENOSCOPY (EGD) WITH PROPOFOL N/A 06/02/2014   Procedure: ESOPHAGOGASTRODUODENOSCOPY (EGD) WITH PROPOFOL;  Surgeon: [name], MD;  [facility];  Service: Endoscopy;  Laterality: N/A;   ESOPHAGOGASTRODUODENOSCOPY (EGD) WITH PROPOFOL N/A 03/19/2021   Procedure: ESOPHAGOGASTRODUODENOSCOPY (EGD) WITH PROPOFOL;  Surgeon: Rush Landmark Telford Nab., MD;  [facility];  Service: Gastroenterology;  Laterality: N/A;   ESOPHAGOGASTRODUODENOSCOPY (EGD) WITH PROPOFOL N/A 03/23/2021   Procedure: ESOPHAGOGASTRODUODENOSCOPY (EGD) WITH PROPOFOL;  Surgeon: Ladene Artist, MD;  [facility];  Service: Endoscopy;  Laterality: N/A;   hernia surgery x 3     Bilateral Inguinal, Umbicial   INSERTION OF MESH N/A 03/20/2018   Procedure: INSERTION OF MESH;  Surgeon: Alphonsa Overall, MD;  Location: [facility];  Service: General;  Laterality: N/A;   IRRIGATION AND DEBRIDEMENT ABSCESS Left 08/14/2012   Procedure: IRRIGATION AND DEBRIDEMENT LEFT INGUINAL BOIL ;  Surgeon: Ailene Rud, MD;  [facility]: [facility];  Service: Urology;  Laterality: Left;   JOINT REPLACEMENT Right 2017   KNEE ARTHROPLASTY     LEFT HEART CATH AND CORONARY ANGIOGRAPHY N/A 01/24/2019   Procedure: LEFT HEART CATH AND CORONARY ANGIOGRAPHY;  Surgeon: Martinique, Peter M, MD;  Location: [facility];  Service: Cardiovascular;  Laterality: N/A;   LEFT HEART CATHETERIZATION WITH CORONARY ANGIOGRAM N/A 05/26/2014   Procedure: LEFT HEART CATHETERIZATION WITH CORONARY ANGIOGRAM;  Surgeon: Blane Ohara, MD;  Location[facility];  Service: Cardiovascular;  Laterality:  N/A;   LUMBAR LAMINECTOMY/DECOMPRESSION MICRODISCECTOMY Left 09/26/2014   Procedure: Lumbar Laminectomy for resection of synovial cyst  Lumbar five- sacral one left;  Surgeon: Kary Kos, MD;  Location: [facility];  Service: Neurosurgery;  Laterality: Left;   MOLE REMOVAL Left 03/20/2018   Procedure: MOLE REMOVAL;  Surgeon: Alphonsa Overall, MD;  Location: [facility];  Service: General;  Laterality: Left;   Oral surg to removed growth from ?sinus  10/2010   ?Dermoid removed by DrRiggs   PACEMAKER INSERTION  04/03/2017   PANCREATIC STENT PLACEMENT  03/19/2021   Procedure: PANCREATIC STENT PLACEMENT;  Surgeon: Irving Copas., MD;  Location: [facility];  Service: Gastroenterology;;   POLYPECTOMY     RCA stenting     '05 RCA   REMOVAL OF STONES  11/29/2021   Procedure: REMOVAL OF SLUDGE;  Surgeon: Rush Landmark Telford Nab., MD;  Location: [facility];  Service: Gastroenterology;;   right toe surgery  Right    Cyst    RIGHT/LEFT HEART CATH AND CORONARY ANGIOGRAPHY N/A 01/15/2017   Procedure: RIGHT/LEFT HEART CATH AND CORONARY ANGIOGRAPHY;  Surgeon: [name], MD;  Location: [facility];  Service: Cardiovascular;  Laterality: N/A;   s/p right knee arthroscopy  2005   SPHINCTEROTOMY  03/19/2021   Procedure: SPHINCTEROTOMY;  Surgeon: Mansouraty, Telford Nab., MD;  Location: [facility];  Service: Gastroenterology;;   Lavell Islam REMOVAL  11/29/2021   Procedure: STENT REMOVAL;  Surgeon: Irving Copas., MD;  Location: [facility];  Service: Gastroenterology;;   SUBMUCOSAL LIFTING INJECTION  03/19/2021   Procedure: SUBMUCOSAL LIFTING INJECTION;  Surgeon: Irving Copas., MD;  Location: [facility];  Service: Gastroenterology;;   thumb surgery Right    TOTAL KNEE ARTHROPLASTY Right 03/12/2016   Procedure: RIGHT TOTAL KNEE ARTHROPLASTY;  Surgeon: Paralee Cancel, MD;  Location: [facility];  Service: Orthopedics;  Laterality: Right;   TOTAL KNEE ARTHROPLASTY Left 12/10/2021   Procedure:  TOTAL KNEE ARTHROPLASTY;  Surgeon: Gaynelle Arabian, MD;  Location: [facility];  Service: Orthopedics;  Laterality: Left;   UPPER ESOPHAGEAL ENDOSCOPIC ULTRASOUND (EUS) N/A 03/19/2021   Procedure: UPPER ESOPHAGEAL ENDOSCOPIC ULTRASOUND (EUS);  Surgeon: Irving Copas., MD;  Location: [facility];  Service: Gastroenterology;  Laterality: N/A;   UPPER GASTROINTESTINAL ENDOSCOPY     VENTRAL HERNIA REPAIR N/A 03/20/2018   Procedure: [place];  Surgeon: Alphonsa Overall, MD;  Location: [facility];  Service: General;  Laterality: N/A;    Current Medications: Current Meds  Medication Sig   acetaminophen (TYLENOL) 650 MG CR tablet Take 650 mg by mouth in the morning and at bedtime.   b complex vitamins tablet Take 1 tablet by mouth daily with lunch.   betamethasone dipropionate 0.05 % cream SMARTSIG:1 sparingly Topical Twice Daily   carvedilol (COREG) 25 MG tablet TAKE 1 TABLET BY MOUTH TWICE DAILY   cholecalciferol (VITAMIN D3) 25 MCG (1000 UNIT) tablet Take 1,000 Units by mouth daily.   Coenzyme Q10 (CO Q 10 PO) Take 200 mg by mouth daily with lunch.   ELIQUIS 5 MG TABS tablet TAKE 1 TABLET BY MOUTH TWICE DAILY   Flaxseed, Linseed, (FLAXSEED OIL PO) Take 1 capsule by mouth daily. With Omega 3   folic acid (FOLVITE) 431 MCG tablet Take 400 mcg by mouth daily.   Glucosamine HCl (GLUCOSAMINE PO) Take 1,500 mg by mouth every evening.   hydrocortisone 2.5 % cream Apply 1 application topically 2 (two) times daily as needed (rash on nose).    Lutein 20 MG TABS Take 20 mg by mouth daily.   Multiple Vitamin (MULTIVITAMIN) capsule Take 1 capsule by mouth daily.     nitroGLYCERIN (NITROSTAT) 0.4 MG SL tablet Place 1 tablet (0.4 mg total) under the tongue every 5 (five) minutes x 3 doses as needed for chest pain.   NON FORMULARY CPAP machine with sleep.   polyethylene glycol (MIRALAX / GLYCOLAX) 17 g packet Take 17 g by mouth daily as needed for mild constipation.    Probiotic Product (PROBIOTIC DAILY PO) Take 1 capsule by mouth daily with lunch.    RESTASIS 0.05 % ophthalmic emulsion Place 1 drop into both eyes 2 (two) times daily as needed (dry eyes).   sacubitril-valsartan (ENTRESTO) 49-51 MG Take 1 tablet by mouth 2 (two) times daily.   Saw Palmetto, Serenoa repens, (SAW PALMETTO PO) Take 450 mg by mouth every evening.   spironolactone (ALDACTONE) 25 MG tablet TAKE 1/2 TABLET(12.5 MG) BY MOUTH DAILY   tamsulosin (FLOMAX) 0.4 MG CAPS capsule Take 0.4 mg by mouth daily.   Testosterone 20 % CREA Apply 2 mLs topically daily. Rub on shoulder   traMADol (ULTRAM) 50 MG  tablet TAKE 1 TABLET(50 MG) BY MOUTH AT BEDTIME AS NEEDED   TURMERIC PO Take 1 tablet by mouth daily with lunch.    vitamin C (ASCORBIC ACID) 500 MG tablet Take 500 mg by mouth daily.   zinc gluconate 50 MG tablet Take 50 mg by mouth daily.   [DISCONTINUED] atorvastatin (LIPITOR) 20 MG tablet TAKE 1 TABLET BY MOUTH DAILY   [DISCONTINUED] hydroxychloroquine (PLAQUENIL) 200 MG tablet TAKE 1 TABLET BY MOUTH DAILY MONDAY-FRIDAY ONLY, DO NOT TAKE ON SATURDAY OR SUNDAY.   [DISCONTINUED] methocarbamol (ROBAXIN) 500 MG tablet Take 1 tablet (500 mg total) by mouth every 6 (six) hours as needed for muscle spasms.   [DISCONTINUED] oxyCODONE (OXY IR/ROXICODONE) 5 MG immediate release tablet Take 1-2 tablets (5-10 mg total) by mouth every 6 (six) hours as needed for severe pain.   [DISCONTINUED] pantoprazole (PROTONIX) 40 MG tablet Take 1 tablet (40 mg total) by mouth 2 (two) times daily before a meal. TAKE 1 TABLET BY MOUTH EVERY DAY BEFORE BREAKFAST (Patient taking differently: Take 40 mg by mouth daily. TAKE 1 TABLET BY MOUTH EVERY DAY BEFORE BREAKFAST)   [DISCONTINUED] sucralfate (CARAFATE) 1 g tablet Take 1 tablet (1 g total) by mouth 4 (four) times daily.     Allergies:   Sulfonamide derivatives   Social History   Socioeconomic History   Marital status: Married    Spouse name: Not on file   Number  of children: 1   Years of education: Not on file   Highest education level: Not on file  Occupational History   Occupation: Charity fundraiser    Employer: [employer]  Tobacco Use   Smoking status: Former    Types: Cigarettes    Quit date: 05/20/1990    Years since quitting: 31.9    Passive exposure: Never   Smokeless tobacco: Never   Tobacco comments:    previous 30 pack year history  Vaping Use   Vaping Use: Never used  Substance and Sexual Activity   Alcohol use: Not Currently    Comment: rare   Drug use: Never   Sexual activity: Not on file  Other Topics Concern   Not on file  Social History Narrative   Not on file   Social Determinants of Health   Financial Resource Strain: Not on file  Food Insecurity: Not on file  Transportation Needs: Not on file  Physical Activity: Not on file  Stress: Not on file  Social Connections: Not on file     Family History: The patient's family history includes Arthritis in his brother and daughter; COPD in his brother; Cancer in his brother; Coronary artery disease in his father; Diabetes in his father; Heart disease in his father and mother; Heart failure in his brother; Hypertension in an other family member; Stomach cancer in his paternal grandmother; Sudden death in his father. There is no history of Colon cancer, Esophageal cancer, Colon polyps, Ulcerative colitis, Liver disease, Pancreatic cancer, Inflammatory bowel disease, or Rectal cancer.  ROS:   Please see the history of present illness.    All other systems reviewed and are negative.  EKGs/Labs/Other Studies Reviewed:    The following studies were reviewed today: Echo 06/22/2020:  1. Left ventricular ejection fraction, by estimation, is 50 to 55%. The  left ventricle has low normal function. The left ventricle has no regional  wall motion abnormalities. There is moderate concentric left ventricular  hypertrophy. Left ventricular  diastolic parameters are  indeterminate.   2. Right  ventricular systolic function is normal. The right ventricular  size is normal. There is normal pulmonary artery systolic pressure.   3. Left atrial size was mildly dilated.   4. The mitral valve is normal in structure. Mild mitral valve  regurgitation. No evidence of mitral stenosis.   5. The aortic valve is tricuspid. There is mild calcification of the  aortic valve. Aortic valve regurgitation is not visualized. No aortic  stenosis is present.   Cardiac Cath 01/24/2019: Mid LAD lesion is 30% stenosed. Non-stenotic Mid RCA lesion was previously treated. The left ventricular systolic function is normal. LV end diastolic pressure is normal. The left ventricular ejection fraction is 55-65% by visual estimate.   1. Nonobstructive CAD. Continued patency of the stent in the RCA 2. Good LV systolic function 3. Normal LVEDP   Plan: will obtain an Echo. Consider a trial of colchicine. Resume Eliquis tomorrow am.  EKG:  EKG is not ordered today.    Recent Labs: 09/27/2021: TSH 3.73 02/19/2022: ALT 15; BUN 13; Creat 0.88; Hemoglobin 13.8; Platelets 307; Potassium 4.4; Sodium 140  Recent Lipid Panel    Component Value Date/Time   CHOL 135 09/27/2021 0815   CHOL 138 11/27/2016 0914   TRIG 235.0 (H) 09/27/2021 0815   HDL 34.10 (L) 09/27/2021 0815   HDL 35 (L) 11/27/2016 0914   CHOLHDL 4 09/27/2021 0815   VLDL 47.0 (H) 09/27/2021 0815   LDLCALC 38 01/24/2019 0725   LDLCALC 65 11/27/2016 0914   LDLDIRECT 75.0 09/27/2021 0815     Risk Assessment/Calculations:    CHA2DS2-VASc Score = 5   This indicates a 7.2% annual risk of stroke. The patient's score is based upon: CHF History: 1 HTN History: 1 Diabetes History: 0 Stroke History: 0 Vascular Disease History: 1 Age Score: 2 Gender Score: 0               Physical Exam:    VS:  BP 110/70   Pulse 85   Ht '5\' 7"'$  (1.702 m)   Wt 193 lb 9.6 oz (87.8 kg)   SpO2 96%   BMI 30.32 kg/m     Wt Readings  from Last 3 Encounters:  05/01/22 193 lb 9.6 oz (87.8 kg)  02/19/22 183 lb 6.4 oz (83.2 kg)  12/10/21 190 lb 0.6 oz (86.2 kg)     GEN:  Well nourished, well developed in no acute distress HEENT: Normal NECK: No JVD; No carotid bruits LYMPHATICS: No lymphadenopathy CARDIAC: RRR, no murmurs, rubs, gallops RESPIRATORY:  Clear to auscultation without rales, wheezing or rhonchi  ABDOMEN: Soft, non-tender, non-distended MUSCULOSKELETAL:  No edema; No deformity  SKIN: Warm and dry NEUROLOGIC:  Alert and oriented x 3 PSYCHIATRIC:  Normal affect   ASSESSMENT:    1. Paroxysmal atrial fibrillation (HCC)   2. Chronic systolic heart failure ([place])   3. Coronary artery disease involving native coronary artery of native heart without angina pectoris   4. Biventricular ICD (implantable cardioverter-defibrillator) in place    PLAN:    In order of problems listed above:  Doing well, asymptomatic, anticoagulated with apixaban, treated with a beta-blocker.  Will update 2D echocardiogram as it is approaching 2 years from the time of his last study. Patient with NYHA functional class II symptoms, previous LVEF 50 to 55% from 2022 echo.  Treated with Entresto, spironolactone, and carvedilol.  Clinically stable.  Needs to work on diet and exercise.  We discussed this at length today, trended his past weights, discussed a graded exercise  program in the context of his osteoarthritic problems. No anginal symptoms.  Clinically stable.  Continue current therapy with beta-blocker and high intensity statin drug. Followed by the EP service.  No ICD discharges noted.           Medication Adjustments/Labs and Tests Ordered: Current medicines are reviewed at length with the patient today.  Concerns regarding medicines are outlined above.  Orders Placed This Encounter  Procedures   ECHOCARDIOGRAM COMPLETE   Meds ordered this encounter  Medications   pantoprazole (PROTONIX) 40 MG tablet    Sig: Take 1  tablet (40 mg total) by mouth daily. before breakfast    Dispense:  90 tablet    Refill:  3    Patient Instructions  Medication Instructions:  Your physician recommends that you continue on your current medications as directed. Please refer to the Current Medication list given to you today.  *If you need a refill on your cardiac medications before your next appointment, please call your pharmacy*   Lab Work: NONE If you have labs (blood work) drawn today and your tests are completely normal, you will receive your results only by: [place] (if you have MyChart) OR A paper copy in the mail If you have any lab test that is abnormal or we need to change your treatment, we will call you to review the results.   Testing/Procedures: ECHO Your physician has requested that you have an echocardiogram. Echocardiography is a painless test that uses sound waves to create images of your heart. It provides your doctor with information about the size and shape of your heart and how well your heart's chambers and valves are working. This procedure takes approximately one hour. There are no restrictions for this procedure. Please do NOT wear cologne, perfume, aftershave, or lotions (deodorant is allowed). Please arrive 15 minutes prior to your appointment time.    Follow-Up: At [facility], you and your health needs are our priority.  As part of our continuing mission to provide you with exceptional heart care, we have created designated Provider Care Teams.  These Care Teams include your primary Cardiologist (physician) and Advanced Practice Providers (APPs -  Physician Assistants and Nurse Practitioners) who all work together to provide you with the care you need, when you need it.  We recommend signing up for the patient portal called "MyChart".  Sign up information is provided on this After Visit Summary.  MyChart is used to connect with patients for Virtual Visits (Telemedicine).   Patients are able to view lab/test results, encounter notes, upcoming appointments, etc.  Non-urgent messages can be sent to your provider as well.   To learn more about what you can do with MyChart, go to [email or web address].    Your next appointment:   6 month(s)  The format for your next appointment:   In Person  Provider:   Nicholes Rough, PA-C, Melina Copa, PA-C, Ermalinda Barrios, PA-C, Christen Bame, NP, or Richardson Dopp, PA-C     Then, [name], MD will plan to see you again in 1 year(s).      Important Information About Sugar         Signed, [name], MD  05/01/2022 1:47 PM    [facility]

## 2022-05-01 NOTE — Patient Instructions (Signed)
Medication Instructions:  Your physician recommends that you continue on your current medications as directed. Please refer to the Current Medication list given to you today.  *If you need a refill on your cardiac medications before your next appointment, please call your pharmacy*   Lab Work: NONE If you have labs (blood work) drawn today and your tests are completely normal, you will receive your results only by: [place] (if you have MyChart) OR A paper copy in the mail If you have any lab test that is abnormal or we need to change your treatment, we will call you to review the results.   Testing/Procedures: ECHO Your physician has requested that you have an echocardiogram. Echocardiography is a painless test that uses sound waves to create images of your heart. It provides your doctor with information about the size and shape of your heart and how well your heart's chambers and valves are working. This procedure takes approximately one hour. There are no restrictions for this procedure. Please do NOT wear cologne, perfume, aftershave, or lotions (deodorant is allowed). Please arrive 15 minutes prior to your appointment time.    Follow-Up: At [facility], you and your health needs are our priority.  As part of our continuing mission to provide you with exceptional heart care, we have created designated Provider Care Teams.  These Care Teams include your primary Cardiologist (physician) and Advanced Practice Providers (APPs -  Physician Assistants and Nurse Practitioners) who all work together to provide you with the care you need, when you need it.  We recommend signing up for the patient portal called "MyChart".  Sign up information is provided on this After Visit Summary.  MyChart is used to connect with patients for Virtual Visits (Telemedicine).  Patients are able to view lab/test results, encounter notes, upcoming appointments, etc.  Non-urgent messages can be sent  to your provider as well.   To learn more about what you can do with MyChart, go to [email or web address].    Your next appointment:   6 month(s)  The format for your next appointment:   In Person  Provider:   Nicholes Rough, PA-C, Melina Copa, PA-C, Ermalinda Barrios, PA-C, Christen Bame, NP, or Richardson Dopp, PA-C     Then, [name], MD will plan to see you again in 1 year(s).      Important Information About Sugar

## 2022-05-01 NOTE — Telephone Encounter (Signed)
Next Visit: 07/23/2022   Last Visit: 02/19/2022  Labs: 02/19/2022 CBC and CMP normal.   Eye exam: 03/14/2021 normal.   Current Dose per office note 02/19/2022: PLQ 200 mg 1 tablet po Monday to Friday   HD:[id number] arthritis involving multiple sites with positive rheumatoid factor   Last Fill: 01/31/2022  Patient states he has updated his PLQ eye exam and will call the eye doctor to have them send the results.   Okay to refill Plaquenil?

## 2022-05-02 ENCOUNTER — Ambulatory Visit (INDEPENDENT_AMBULATORY_CARE_PROVIDER_SITE_OTHER): Payer: Medicare Other

## 2022-05-02 DIAGNOSIS — I428 Other cardiomyopathies: Secondary | ICD-10-CM | POA: Diagnosis not present

## 2022-05-02 LAB — CUP PACEART REMOTE DEVICE CHECK
Battery Remaining Longevity: 19 mo
Battery Voltage: 2.92 V
Brady Statistic AP VP Percent: 0.36 %
Brady Statistic AP VS Percent: 0.01 %
Brady Statistic AS VP Percent: 97.15 %
Brady Statistic AS VS Percent: 2.48 %
Brady Statistic RA Percent Paced: 0.37 %
Brady Statistic RV Percent Paced: 44.23 %
Date Time Interrogation Session: 20231214044224
HighPow Impedance: 64 Ohm
Implantable Lead Connection Status: 753985
Implantable Lead Connection Status: 753985
Implantable Lead Connection Status: 753985
Implantable Lead Implant Date: 20181108
Implantable Lead Implant Date: 20181108
Implantable Lead Implant Date: 20181108
Implantable Lead Location: 753858
Implantable Lead Location: 753859
Implantable Lead Location: 753860
Implantable Lead Model: 4298
Implantable Lead Model: 5076
Implantable Pulse Generator Implant Date: 20181108
Lead Channel Impedance Value: 1083 Ohm
Lead Channel Impedance Value: 1178 Ohm
Lead Channel Impedance Value: 1235 Ohm
Lead Channel Impedance Value: 182.4 Ohm
Lead Channel Impedance Value: 190.884
Lead Channel Impedance Value: 222.933
Lead Channel Impedance Value: 295.059
Lead Channel Impedance Value: 304 Ohm
Lead Channel Impedance Value: 317.915
Lead Channel Impedance Value: 399 Ohm
Lead Channel Impedance Value: 456 Ohm
Lead Channel Impedance Value: 513 Ohm
Lead Channel Impedance Value: 589 Ohm
Lead Channel Impedance Value: 608 Ohm
Lead Channel Impedance Value: 646 Ohm
Lead Channel Impedance Value: 665 Ohm
Lead Channel Impedance Value: 722 Ohm
Lead Channel Impedance Value: 836 Ohm
Lead Channel Pacing Threshold Amplitude: 0.375 V
Lead Channel Pacing Threshold Amplitude: 0.625 V
Lead Channel Pacing Threshold Amplitude: 2.375 V
Lead Channel Pacing Threshold Pulse Width: 0.4 ms
Lead Channel Pacing Threshold Pulse Width: 0.4 ms
Lead Channel Pacing Threshold Pulse Width: 1 ms
Lead Channel Sensing Intrinsic Amplitude: 12.625 mV
Lead Channel Sensing Intrinsic Amplitude: 12.625 mV
Lead Channel Sensing Intrinsic Amplitude: 2.25 mV
Lead Channel Sensing Intrinsic Amplitude: 2.25 mV
Lead Channel Setting Pacing Amplitude: 2 V
Lead Channel Setting Pacing Amplitude: 2.5 V
Lead Channel Setting Pacing Amplitude: 3 V
Lead Channel Setting Pacing Pulse Width: 0.4 ms
Lead Channel Setting Pacing Pulse Width: 1 ms
Lead Channel Setting Sensing Sensitivity: 0.3 mV
Zone Setting Status: 755011
Zone Setting Status: 755011

## 2022-05-14 DIAGNOSIS — E291 Testicular hypofunction: Secondary | ICD-10-CM | POA: Diagnosis not present

## 2022-05-17 DIAGNOSIS — M25552 Pain in left hip: Secondary | ICD-10-CM | POA: Diagnosis not present

## 2022-05-17 DIAGNOSIS — Z96652 Presence of left artificial knee joint: Secondary | ICD-10-CM | POA: Diagnosis not present

## 2022-05-21 ENCOUNTER — Other Ambulatory Visit: Payer: Self-pay | Admitting: Rheumatology

## 2022-05-22 NOTE — Telephone Encounter (Signed)
Next Visit: 07/23/2022   Last Visit: 02/19/2022   UDS:02/19/2022   Narc Agreement: 02/19/2022   Last Fill: 04/09/2022  tramadol '50mg'$  by mouth at bedtime as needed for pain.   Okay to refill Tramadol?

## 2022-05-28 NOTE — Progress Notes (Signed)
Remote ICD transmission.   

## 2022-05-29 ENCOUNTER — Ambulatory Visit (INDEPENDENT_AMBULATORY_CARE_PROVIDER_SITE_OTHER): Payer: Medicare Other | Admitting: Internal Medicine

## 2022-05-29 ENCOUNTER — Ambulatory Visit: Payer: Medicare Other | Admitting: Internal Medicine

## 2022-05-29 VITALS — BP 128/70 | HR 91 | Temp 97.7°F | Ht 67.0 in | Wt 186.0 lb

## 2022-05-29 DIAGNOSIS — K219 Gastro-esophageal reflux disease without esophagitis: Secondary | ICD-10-CM | POA: Diagnosis not present

## 2022-05-29 DIAGNOSIS — Z7184 Encounter for health counseling related to travel: Secondary | ICD-10-CM | POA: Diagnosis not present

## 2022-05-29 DIAGNOSIS — G4733 Obstructive sleep apnea (adult) (pediatric): Secondary | ICD-10-CM | POA: Diagnosis not present

## 2022-05-29 DIAGNOSIS — E782 Mixed hyperlipidemia: Secondary | ICD-10-CM | POA: Diagnosis not present

## 2022-05-29 DIAGNOSIS — J3489 Other specified disorders of nose and nasal sinuses: Secondary | ICD-10-CM | POA: Diagnosis not present

## 2022-05-29 DIAGNOSIS — I10 Essential (primary) hypertension: Secondary | ICD-10-CM | POA: Diagnosis not present

## 2022-05-29 DIAGNOSIS — Z0001 Encounter for general adult medical examination with abnormal findings: Secondary | ICD-10-CM

## 2022-05-29 DIAGNOSIS — M461 Sacroiliitis, not elsewhere classified: Secondary | ICD-10-CM | POA: Insufficient documentation

## 2022-05-29 DIAGNOSIS — R7303 Prediabetes: Secondary | ICD-10-CM

## 2022-05-29 DIAGNOSIS — E559 Vitamin D deficiency, unspecified: Secondary | ICD-10-CM | POA: Diagnosis not present

## 2022-05-29 DIAGNOSIS — E538 Deficiency of other specified B group vitamins: Secondary | ICD-10-CM

## 2022-05-29 LAB — HEPATIC FUNCTION PANEL
ALT: 17 U/L (ref 0–53)
AST: 15 U/L (ref 0–37)
Albumin: 4.7 g/dL (ref 3.5–5.2)
Alkaline Phosphatase: 86 U/L (ref 39–117)
Bilirubin, Direct: 0.2 mg/dL (ref 0.0–0.3)
Total Bilirubin: 1.2 mg/dL (ref 0.2–1.2)
Total Protein: 7.5 g/dL (ref 6.0–8.3)

## 2022-05-29 LAB — BASIC METABOLIC PANEL
BUN: 25 mg/dL — ABNORMAL HIGH (ref 6–23)
CO2: 26 mEq/L (ref 19–32)
Calcium: 10.5 mg/dL (ref 8.4–10.5)
Chloride: 103 mEq/L (ref 96–112)
Creatinine, Ser: 1.01 mg/dL (ref 0.40–1.50)
GFR: 71.37 mL/min (ref >60.00)
Glucose, Bld: 103 mg/dL — ABNORMAL HIGH (ref 70–99)
Potassium: 4.4 mEq/L (ref 3.5–5.1)
Sodium: 138 mEq/L (ref 135–145)

## 2022-05-29 LAB — CBC WITH DIFFERENTIAL/PLATELET
Basophils Absolute: 0.1 10*3/uL (ref 0.0–0.1)
Basophils Relative: 0.6 % (ref 0.0–3.0)
Eosinophils Absolute: 0.1 10*3/uL (ref 0.0–0.7)
Eosinophils Relative: 1 % (ref 0.0–5.0)
HCT: 46.8 % (ref 39.0–52.0)
Hemoglobin: 15.3 g/dL (ref 13.0–17.0)
Lymphocytes Relative: 3.1 % — ABNORMAL LOW (ref 12.0–46.0)
Lymphs Abs: 0.3 10*3/uL — ABNORMAL LOW (ref 0.7–4.0)
MCHC: 32.6 g/dL (ref 30.0–36.0)
MCV: 89.2 fl (ref 78.0–100.0)
Monocytes Absolute: 0.6 10*3/uL (ref 0.1–1.0)
Monocytes Relative: 5.9 % (ref 3.0–12.0)
Neutro Abs: 9.1 10*3/uL — ABNORMAL HIGH (ref 1.4–7.7)
Neutrophils Relative %: 89.4 % — ABNORMAL HIGH (ref 43.0–77.0)
Platelets: 277 10*3/uL (ref 150.0–400.0)
RBC: 5.24 Mil/uL (ref 4.22–5.81)
RDW: 15.2 % (ref 11.5–15.5)
WBC: 10.2 10*3/uL (ref 4.0–10.5)

## 2022-05-29 LAB — VITAMIN D 25 HYDROXY (VIT D DEFICIENCY, FRACTURES): VITD: 47.91 ng/mL (ref 30.00–100.00)

## 2022-05-29 LAB — MICROALBUMIN / CREATININE URINE RATIO
Creatinine,U: 341 mg/dL
Microalb Creat Ratio: 1.2 mg/g (ref 0.0–30.0)
Microalb, Ur: 4.1 mg/dL — ABNORMAL HIGH (ref 0.0–1.9)

## 2022-05-29 LAB — URINALYSIS, ROUTINE W REFLEX MICROSCOPIC
Bilirubin Urine: NEGATIVE
Hgb urine dipstick: NEGATIVE
Leukocytes,Ua: NEGATIVE
Nitrite: NEGATIVE
RBC / HPF: NONE SEEN (ref 0–?)
Specific Gravity, Urine: >=1.03 — AB (ref 1.000–1.030)
Urine Glucose: NEGATIVE
Urobilinogen, UA: 0.2 (ref 0.0–1.0)
pH: 5.5 (ref 5.0–8.0)

## 2022-05-29 LAB — LIPID PANEL
Cholesterol: 121 mg/dL (ref 0–200)
HDL: 35.7 mg/dL — ABNORMAL LOW (ref >39.00)
LDL Cholesterol: 51 mg/dL (ref 0–99)
NonHDL: 85.7
Total CHOL/HDL Ratio: 3
Triglycerides: 175 mg/dL — ABNORMAL HIGH (ref 0.0–149.0)
VLDL: 35 mg/dL (ref 0.0–40.0)

## 2022-05-29 LAB — VITAMIN B12: Vitamin B-12: >1500 pg/mL — ABNORMAL HIGH (ref 211–911)

## 2022-05-29 LAB — HEMOGLOBIN A1C: Hgb A1c MFr Bld: 6.1 % (ref 4.6–6.5)

## 2022-05-29 LAB — TSH: TSH: 2.26 u[IU]/mL (ref 0.35–5.50)

## 2022-05-29 MED ORDER — PANTOPRAZOLE SODIUM 40 MG PO TBEC: 40.0000 mg | DELAYED_RELEASE_TABLET | Freq: Every day | ORAL | 3 refills | Status: DC

## 2022-05-29 MED ORDER — AZITHROMYCIN 250 MG PO TABS: ORAL_TABLET | ORAL | 1 refills | Status: AC

## 2022-05-29 NOTE — Progress Notes (Signed)
Patient ID: [name], male   DOB: 03/13/1946, 77 y.o.   MRN: [id number]         Chief Complaint:: wellness exam and referral for sleep study  , hyperglycemia PreDM, rhiniorrhea, travel medicine and dydpepsia       HPI:  [name] is a 77 y.o. male here for wellness exam; decliens covid booster, o/w up to date                        Also Pt denies chest pain, increased sob or doe, wheezing, orthopnea, PND, increased LE swelling, palpitations, dizziness or syncope.   Pt denies polydipsia, polyuria, or new focal neuro s/s.    Pt denies fever, wt loss, night sweats, loss of appetite, or other constitutional symptoms  Has hx of OSA, CPAP machine is about 10 yrs ago, needs new machine and f/u evalutaion.  Also has persistent chronic rhinorrhea for several years as well.  Always seems to become ill with URI with travel, has 21 day cruise coming up  Has had mild worsening reflux with intermittent nausea and abd pain mild, dull, but no dysphagia, vomiting,, bowel change or blood.     Wt Readings from Last 3 Encounters:  05/29/22 186 lb (84.4 kg)  05/01/22 193 lb 9.6 oz (87.8 kg)  02/19/22 183 lb 6.4 oz (83.2 kg)   BP Readings from Last 3 Encounters:  05/29/22 128/70  05/01/22 110/70  02/19/22 126/72   Immunization History  Administered Date(s) Administered   Fluad Quad(high Dose 65+) 02/18/2019, 02/21/2022   Influenza Split 03/21/2011   Influenza Whole 04/06/2009, 04/09/2010   Influenza, High Dose Seasonal PF 04/20/2015, 04/15/2016, 02/21/2017   Influenza,inj,Quad PF,6+ Mos 03/24/2014   Influenza-Unspecified 02/26/2018, 02/18/2019, 02/26/2020   PFIZER(Purple Top)SARS-COV-2 Vaccination 05/27/2019, 06/17/2019, 02/15/2020   Pfizer Covid-19 Vaccine Bivalent Booster 64yr & up 02/21/2022   Pneumococcal Conjugate-13 11/29/2014   Pneumococcal Polysaccharide-23 06/25/2018   Rsv, Mab, NKathie Rhodes 0.5 Ml, Neonate To 24 Mos(Beyfortus) 02/21/2022   Tdap 11/29/2014   Unspecified  SARS-COV-2 Vaccination 02/03/2022   Zoster Recombinat (Shingrix) 06/07/2018, 09/26/2018   Zoster, Live 05/20/2006  There are no preventive care reminders to display for this patient.    Past Medical History:  Diagnosis Date   AICD (automatic cardioverter/defibrillator) present    Allergic rhinitis    Allergy    Anginal pain ([place] 05/26/14   chest pain after chasing dog   Atypical nevus of back 04/27/2003   moderate - mid lower back   Basal cell carcinoma 01/26/2008   right cheek - MOHs   CAD (coronary artery disease)    hx of stent- 2005 RCA   Cataract    beginning stage both eyes   CHF (congestive heart failure) (HCC)    pacemaker Medtronic     Chronic back pain    intermittent   Constipation    Dizziness    Dysrhythmia    right bundle branch block    Esophageal stricture    GERD (gastroesophageal reflux disease)    Hemoptysis    Hiatal hernia    History of colonic polyps    hyperplastic   HTN (hypertension)    Hyperlipidemia    Hypertrophy of prostate with urinary obstruction and other lower urinary tract symptoms (LUTS)    OA (osteoarthritis)    OSA (obstructive sleep apnea)    cpap- 10    Other specified disorder of stomach and duodenum    duodenal periampulary tubulovillous adenoma removed  by Dr. Ardis Hughs 5/10   Pericarditis 07/06/2019   Pneumonia    Pre-diabetes    no medications   Shortness of breath    with exertion   Sleep apnea    cpap   Testicular hypofunction    Past Surgical History:  Procedure Laterality Date   BILIARY STENT PLACEMENT N/A 03/19/2021   Procedure: BILIARY STENT PLACEMENT;  Surgeon: Irving Copas., MD;  Location: [facility];  Service: Gastroenterology;  Laterality: N/A;   BIV ICD INSERTION CRT-D N/A 03/27/2017   Procedure: BIV ICD INSERTION CRT-D;  Surgeon: Constance Haw, MD;  Location: [place] [facility];  Service: Cardiovascular;  Laterality: N/A;   CARDIAC CATHETERIZATION     '05, last 2009, showing patent RCA  stent   COLONOSCOPY  12/2007   HYPERPLASTIC POLYP   COLONOSCOPY  12/2019   COLONOSCOPY WITH PROPOFOL N/A 06/02/2014   Procedure: COLONOSCOPY WITH PROPOFOL;  Surgeon: [name], MD;  Location: [facility];  Service: Endoscopy;  Laterality: N/A;   CORONARY ANGIOPLASTY  08/2003   ENDOSCOPIC MUCOSAL RESECTION  03/19/2021   Procedure: ENDOSCOPIC MUCOSAL RESECTION, ampullectomy;  Surgeon: Irving Copas., MD;  Location: [facility];  Service: Gastroenterology;;   ENDOSCOPIC RETROGRADE CHOLANGIOPANCREATOGRAPHY (ERCP) WITH PROPOFOL N/A 03/19/2021   Procedure: ENDOSCOPIC RETROGRADE CHOLANGIOPANCREATOGRAPHY (ERCP) WITH PROPOFOL;  Surgeon: Irving Copas., MD;  Location: [facility];  Service: Gastroenterology;  Laterality: N/A;   ENDOSCOPIC RETROGRADE CHOLANGIOPANCREATOGRAPHY (ERCP) WITH PROPOFOL N/A 11/29/2021   Procedure: ENDOSCOPIC RETROGRADE CHOLANGIOPANCREATOGRAPHY (ERCP) WITH PROPOFOL;  Surgeon: Rush Landmark Telford Nab., MD;  Location: [place];  Service: Gastroenterology;  Laterality: N/A;   ESOPHAGOGASTRODUODENOSCOPY (EGD) WITH PROPOFOL N/A 06/02/2014   Procedure: ESOPHAGOGASTRODUODENOSCOPY (EGD) WITH PROPOFOL;  Surgeon: [name], MD;  [facility];  Service: Endoscopy;  Laterality: N/A;   ESOPHAGOGASTRODUODENOSCOPY (EGD) WITH PROPOFOL N/A 03/19/2021   Procedure: ESOPHAGOGASTRODUODENOSCOPY (EGD) WITH PROPOFOL;  Surgeon: Rush Landmark Telford Nab., MD;  [facility];  Service: Gastroenterology;  Laterality: N/A;   ESOPHAGOGASTRODUODENOSCOPY (EGD) WITH PROPOFOL N/A 03/23/2021   Procedure: ESOPHAGOGASTRODUODENOSCOPY (EGD) WITH PROPOFOL;  Surgeon: Ladene Artist, MD;  [facility];  Service: Endoscopy;  Laterality: N/A;   hernia surgery x 3     Bilateral Inguinal, Umbicial   INSERTION OF MESH N/A 03/20/2018   Procedure: INSERTION OF MESH;  Surgeon: Alphonsa Overall, MD;  Location: [place];  Service: General;  Laterality: N/A;   IRRIGATION AND  DEBRIDEMENT ABSCESS Left 08/14/2012   Procedure: IRRIGATION AND DEBRIDEMENT LEFT INGUINAL BOIL ;  Surgeon: Ailene Rud, MD;  [facility];  Service: Urology;  Laterality: Left;   JOINT REPLACEMENT Right 2017   KNEE ARTHROPLASTY     LEFT HEART CATH AND CORONARY ANGIOGRAPHY N/A 01/24/2019   Procedure: LEFT HEART CATH AND CORONARY ANGIOGRAPHY;  Surgeon: Martinique, Peter M, MD;  Location: [place] [facility];  Service: Cardiovascular;  Laterality: N/A;   LEFT HEART CATHETERIZATION WITH CORONARY ANGIOGRAM N/A 05/26/2014   Procedure: LEFT HEART CATHETERIZATION WITH CORONARY ANGIOGRAM;  Surgeon: Blane Ohara, MD;  Location: [facility];  Service: Cardiovascular;  Laterality: N/A;   LUMBAR LAMINECTOMY/DECOMPRESSION MICRODISCECTOMY Left 09/26/2014   Procedure: Lumbar Laminectomy for resection of synovial cyst  Lumbar five- sacral one left;  Surgeon: Kary Kos, MD;  Location: [place] [facility];  Service: Neurosurgery;  Laterality: Left;   MOLE REMOVAL Left 03/20/2018   Procedure: MOLE REMOVAL;  Surgeon: Alphonsa Overall, MD;  Location: [place];  Service: General;  Laterality: Left;   Oral surg to  removed growth from ?sinus  10/2010   ?Dermoid removed by Dr[facility]iggs   PACEMAKER INSERTION  04/03/2017   PANCREATIC STENT PLACEMENT  03/19/2021   Procedure: PANCREATIC STENT PLACEMENT;  Surgeon: Irving Copas., MD;  Location: [facility];  Service: Gastroenterology;;   POLYPECTOMY     RCA stenting     '05 RCA   REMOVAL OF STONES  11/29/2021   Procedure: REMOVAL OF SLUDGE;  Surgeon: Rush Landmark Telford Nab., MD;  Location: [facility];  Service: Gastroenterology;;   right toe surgery  Right    Cyst    RIGHT/LEFT HEART CATH AND CORONARY ANGIOGRAPHY N/A 01/15/2017   Procedure: RIGHT/LEFT HEART CATH AND CORONARY ANGIOGRAPHY;  Surgeon: [name], MD;  Location: [place] CV LAB;  Service: Cardiovascular;  Laterality: N/A;   s/p right knee arthroscopy  2005   SPHINCTEROTOMY  03/19/2021   Procedure:  SPHINCTEROTOMY;  Surgeon: Mansouraty, Telford Nab., MD;  Location: [facility];  Service: Gastroenterology;;   Lavell Islam REMOVAL  11/29/2021   Procedure: STENT REMOVAL;  Surgeon: Irving Copas., MD;  Location: [place];  Service: Gastroenterology;;   SUBMUCOSAL LIFTING INJECTION  03/19/2021   Procedure: SUBMUCOSAL LIFTING INJECTION;  Surgeon: Irving Copas., MD;  Location: [facility];  Service: Gastroenterology;;   thumb surgery Right    TOTAL KNEE ARTHROPLASTY Right 03/12/2016   Procedure: RIGHT TOTAL KNEE ARTHROPLASTY;  Surgeon: Paralee Cancel, MD;  Location: [facility];  Service: Orthopedics;  Laterality: Right;   TOTAL KNEE ARTHROPLASTY Left 12/10/2021   Procedure: TOTAL KNEE ARTHROPLASTY;  Surgeon: Gaynelle Arabian, MD;  Location: [facility];  Service: Orthopedics;  Laterality: Left;   UPPER ESOPHAGEAL ENDOSCOPIC ULTRASOUND (EUS) N/A 03/19/2021   Procedure: UPPER ESOPHAGEAL ENDOSCOPIC ULTRASOUND (EUS);  Surgeon: Irving Copas., MD;  Location: [facility];  Service: Gastroenterology;  Laterality: N/A;   UPPER GASTROINTESTINAL ENDOSCOPY     VENTRAL HERNIA REPAIR N/A 03/20/2018   Procedure: LAPAROSCOPIC VENTRAL INCISIONAL  HERNIA ERAS PATHWAY;  Surgeon: Alphonsa Overall, MD;  Location: [place];  Service: General;  Laterality: N/A;    reports that he quit smoking about 32 years ago. His smoking use included cigarettes. He has never been exposed to tobacco smoke. He has never used smokeless tobacco. He reports that he does not currently use alcohol. He reports that he does not use drugs. family history includes Arthritis in his brother and daughter; COPD in his brother; Cancer in his brother; Coronary artery disease in his father; Diabetes in his father; Heart disease in his father and mother; Heart failure in his brother; Hypertension in an other family member; Stomach cancer in his paternal grandmother; Sudden death in his father. Allergies  Allergen Reactions   Sulfonamide  Derivatives Rash        Current Outpatient Medications on File Prior to Visit  Medication Sig Dispense Refill   acetaminophen (TYLENOL) 650 MG CR tablet Take 650 mg by mouth in the morning and at bedtime.     atorvastatin (LIPITOR) 20 MG tablet TAKE 1 TABLET BY MOUTH DAILY 90 tablet 0   b complex vitamins tablet Take 1 tablet by mouth daily with lunch.     betamethasone dipropionate 0.05 % cream SMARTSIG:1 sparingly Topical Twice Daily     carvedilol (COREG) 25 MG tablet TAKE 1 TABLET BY MOUTH TWICE DAILY 180 tablet 3   cholecalciferol (VITAMIN D3) 25 MCG (1000 UNIT) tablet Take 1,000 Units by mouth daily.     Coenzyme Q10 (CO Q 10 PO) Take 200 mg by mouth daily with  lunch.     ELIQUIS 5 MG TABS tablet TAKE 1 TABLET BY MOUTH TWICE DAILY 180 tablet 1   Flaxseed, Linseed, (FLAXSEED OIL PO) Take 1 capsule by mouth daily. With Omega 3     fluocinonide (LIDEX) 0.05 % external solution APPLY 1 ML TOPICALLY TO THE SCALP TWICE DAILY     folic acid (FOLVITE) 998 MCG tablet Take 400 mcg by mouth daily.     Glucosamine HCl (GLUCOSAMINE PO) Take 1,500 mg by mouth every evening.     hydrocortisone 2.5 % cream Apply 1 application topically 2 (two) times daily as needed (rash on nose).      hydroxychloroquine (PLAQUENIL) 200 MG tablet TAKE 1 TABLET BY MOUTH EVERY DAY ON MONDAY-FRIDAY ONLY, DO NOT TAKE ON SATURDAY OR SUNDAY 60 tablet 0   Lutein 20 MG TABS Take 20 mg by mouth daily.     Multiple Vitamin (MULTIVITAMIN) capsule Take 1 capsule by mouth daily.       nitroGLYCERIN (NITROSTAT) 0.4 MG SL tablet Place 1 tablet (0.4 mg total) under the tongue every 5 (five) minutes x 3 doses as needed for chest pain. 25 tablet 0   NON FORMULARY CPAP machine with sleep.     Probiotic Product (PROBIOTIC DAILY PO) Take 1 capsule by mouth daily with lunch.      RESTASIS 0.05 % ophthalmic emulsion Place 1 drop into both eyes 2 (two) times daily as needed (dry eyes).     sacubitril-valsartan (ENTRESTO) 49-51 MG Take 1  tablet by mouth 2 (two) times daily. 180 tablet 3   Saw Palmetto, Serenoa repens, (SAW PALMETTO PO) Take 450 mg by mouth every evening.     spironolactone (ALDACTONE) 25 MG tablet TAKE 1/2 TABLET(12.5 MG) BY MOUTH DAILY 45 tablet 3   tamsulosin (FLOMAX) 0.4 MG CAPS capsule Take 0.4 mg by mouth daily.     Testosterone 20 % CREA Apply 2 mLs topically daily. Rub on shoulder     tobramycin-dexamethasone (TOBRADEX) ophthalmic ointment APPLY A SMALL AMOUNT ONTO ITCHING EYELID AREA 3X/DAY FOR 1 WEEK THEN DAILY FOR 1 WEEK THEN EVERY OTHER DAY FOR 2 WEEKS.     traMADol (ULTRAM) 50 MG tablet TAKE 1 TABLET BY MOUTH EVERY NIGHT AT BEDTIME AS NEEDED 30 tablet 0   TURMERIC PO Take 1 tablet by mouth daily with lunch.      vitamin C (ASCORBIC ACID) 500 MG tablet Take 500 mg by mouth daily.     zinc gluconate 50 MG tablet Take 50 mg by mouth daily.     polyethylene glycol (MIRALAX / GLYCOLAX) 17 g packet Take 17 g by mouth daily as needed for mild constipation. (Patient not taking: Reported on 05/29/2022) 14 each 0   No current facility-administered medications on file prior to visit.        ROS:  All others reviewed and negative.  Objective        PE:  BP 128/70 (BP Location: Right Arm, Patient Position: Sitting, Cuff Size: Large)   Pulse 91   Temp 97.7 F (36.5 C) (Oral)   Ht '5\' 7"'$  (1.702 m)   Wt 186 lb (84.4 kg)   SpO2 95%   BMI 29.13 kg/m                 Constitutional: Pt appears in NAD               HENT: Head: NCAT.  Right Ear: External ear normal.                 Left Ear: External ear normal.                Eyes: . Pupils are equal, round, and reactive to light. Conjunctivae and EOM are normal               Nose: without d/c or deformity               Neck: Neck supple. Gross normal ROM               Cardiovascular: Normal rate and regular rhythm.                 Pulmonary/Chest: Effort normal and breath sounds without rales or wheezing.                Abd:  Soft, NT, ND, +  BS, no organomegaly               Neurological: Pt is alert. At baseline orientation, motor grossly intact               Skin: Skin is warm. No rashes, no other new lesions, LE edema - none               Psychiatric: Pt behavior is normal without agitation   Micro: none  Cardiac tracings I have personally interpreted today:  none  Pertinent Radiological findings (summarize): none   Lab Results  Component Value Date   WBC 10.2 05/29/2022   HGB 15.3 05/29/2022   HCT 46.8 05/29/2022   PLT 277.0 05/29/2022   GLUCOSE 103 (H) 05/29/2022   CHOL 121 05/29/2022   TRIG 175.0 (H) 05/29/2022   HDL 35.70 (L) 05/29/2022   LDLDIRECT 75.0 09/27/2021   LDLCALC 51 05/29/2022   ALT 17 05/29/2022   AST 15 05/29/2022   NA 138 05/29/2022   K 4.4 05/29/2022   CL 103 05/29/2022   CREATININE 1.01 05/29/2022   BUN 25 (H) 05/29/2022   CO2 26 05/29/2022   TSH 2.26 05/29/2022   PSA 2.37 09/27/2021   INR 1.1 03/23/2021   HGBA1C 6.1 05/29/2022   MICROALBUR 4.1 (H) 05/29/2022   Assessment/Plan:  [name] is a 77 y.o. White or Caucasian [1] male with  has a past medical history of AICD (automatic cardioverter/defibrillator) present, Allergic rhinitis, Allergy, Anginal pain ([place]) (05/26/14), Atypical nevus of back (04/27/2003), Basal cell carcinoma (01/26/2008), CAD (coronary artery disease), Cataract, CHF (congestive heart failure) ([place]), Chronic back pain, Constipation, Dizziness, Dysrhythmia, Esophageal stricture, GERD (gastroesophageal reflux disease), Hemoptysis, Hiatal hernia, History of colonic polyps, HTN (hypertension), Hyperlipidemia, Hypertrophy of prostate with urinary obstruction and other lower urinary tract symptoms (LUTS), OA (osteoarthritis), OSA (obstructive sleep apnea), Other specified disorder of stomach and duodenum, Pericarditis (07/06/2019), Pneumonia, Pre-diabetes, Shortness of breath, Sleep apnea, and Testicular hypofunction.  Encounter for well adult exam with abnormal  findings Age and sex appropriate education and counseling updated with regular exercise and diet Referrals for preventative services - none needed Immunizations addressed - declines covid booster Smoking counseling  - none needed Evidence for depression or other mood disorder - none significant Most recent labs reviewed. I have personally reviewed and have noted: 1) the patient's medical and social history 2) The patient's current medications and supplements 3) The patient's height, weight, and BMI have been recorded in the chart   Essential hypertension BP Readings from Last 3  Encounters:  05/29/22 128/70  05/01/22 110/70  02/19/22 126/72   Stable, pt to continue medical treatment coreg 25 bid, entresto 49-51 1 qd   Gastroesophageal reflux disease With mild dyspepsia possible gastritis as well, for protonix 40 mg qd  Hyperlipidemia Lab Results  Component Value Date   LDLCALC 51 05/29/2022   Stable, pt to continue current statin lipitor 20 mg qd   Prediabetes Lab Results  Component Value Date   HGBA1C 6.1 05/29/2022   Stable, pt to continue current medical treatment  - diet, wt control   Rhinorrhea 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  Cathlean Cower, MD 05/30/2022 4:51 AM [facility]

## 2022-05-29 NOTE — Patient Instructions (Signed)
Please continue all other medications as before, and refills have been done if requested - zpack  Please take all new medication as prescribed  - the protonix for the stomach  Please have the pharmacy call with any other refills you may need.  Please continue your efforts at being more active, low cholesterol diet, and weight control.  You are otherwise up to date with prevention measures today.  Please keep your appointments with your specialists as you may have planned  You will be contacted regarding the referral for: ENT or pulmonary (whichever first)  Please go to the LAB at the blood drawing area for the tests to be done  You will be contacted by phone if any changes need to be made immediately.  Otherwise, you will receive a letter about your results with an explanation, but please check with MyChart first.  Please remember to sign up for MyChart if you have not done so, as this will be important to you in the future with finding out test results, communicating by private email, and scheduling acute appointments online when needed.  Please make an Appointment to return for your 1 year visit, or sooner if needed

## 2022-05-30 ENCOUNTER — Encounter: Payer: Self-pay | Admitting: Internal Medicine

## 2022-05-30 DIAGNOSIS — Z7184 Encounter for health counseling related to travel: Secondary | ICD-10-CM | POA: Insufficient documentation

## 2022-05-30 DIAGNOSIS — J3489 Other specified disorders of nose and nasal sinuses: Secondary | ICD-10-CM | POA: Insufficient documentation

## 2022-05-30 DIAGNOSIS — G4733 Obstructive sleep apnea (adult) (pediatric): Secondary | ICD-10-CM | POA: Insufficient documentation

## 2022-05-30 NOTE — Assessment & Plan Note (Signed)
Due for f/u - will refer ENT and pulmonary depending on which can be seen sooner

## 2022-05-30 NOTE — Assessment & Plan Note (Signed)
Lab Results  Component Value Date   HGBA1C 6.1 05/29/2022   Stable, pt to continue current medical treatment  - diet, wt control

## 2022-05-30 NOTE — Assessment & Plan Note (Signed)
BP Readings from Last 3 Encounters:  05/29/22 128/70  05/01/22 110/70  02/19/22 126/72   Stable, pt to continue medical treatment coreg 25 bid, entresto 49-51 1 qd

## 2022-05-30 NOTE — Assessment & Plan Note (Signed)
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

## 2022-05-30 NOTE — Assessment & Plan Note (Signed)
[place] for zpack asd,  to f/u any worsening symptoms or concerns

## 2022-05-30 NOTE — Assessment & Plan Note (Signed)
With mild dyspepsia possible gastritis as well, for protonix 40 mg qd

## 2022-05-30 NOTE — Assessment & Plan Note (Signed)
Lab Results  Component Value Date   LDLCALC 51 05/29/2022   Stable, pt to continue current statin lipitor 20 mg qd

## 2022-05-30 NOTE — Assessment & Plan Note (Signed)

## 2022-06-04 ENCOUNTER — Encounter: Payer: Self-pay | Admitting: Internal Medicine

## 2022-06-05 ENCOUNTER — Other Ambulatory Visit (HOSPITAL_COMMUNITY): Payer: Medicare Other

## 2022-06-07 NOTE — Telephone Encounter (Signed)
Pt is concern about A1C. He states he is not current treated for blood sugar../l,mb

## 2022-06-10 ENCOUNTER — Ambulatory Visit: Payer: Medicare Other | Attending: Cardiology

## 2022-06-10 DIAGNOSIS — Z95810 Presence of automatic (implantable) cardiac defibrillator: Secondary | ICD-10-CM

## 2022-06-10 DIAGNOSIS — I5022 Chronic systolic (congestive) heart failure: Secondary | ICD-10-CM

## 2022-06-11 DIAGNOSIS — E291 Testicular hypofunction: Secondary | ICD-10-CM | POA: Diagnosis not present

## 2022-06-11 DIAGNOSIS — R948 Abnormal results of function studies of other organs and systems: Secondary | ICD-10-CM | POA: Diagnosis not present

## 2022-06-14 NOTE — Progress Notes (Signed)
EPIC Encounter for ICM Monitoring  Patient Name: [name] is a 77 y.o. male Date: 06/14/2022 Primary Care Physican: Biagio Borg, MD Primary Cardiologist: Burt Knack Electrophysiologist: Curt Bears Bi-V Pacing:   95.7%  Effective 95.1%      07/04/2021 Weight: 197 lbs  10/17/2021 Weight: 190 lbs 11/21/2021 Weight: 190 lbs 04/15/2022 Weight: 190 lbs 05/01/2022 Office Weight: 193 lbs   Time in AT/AF  <0.1 hr/day (<0.1%)                                                            Spoke with patient and transmission results reviewed.  Pt asymptomatic for fluid accumulation.  Reports feeling well at this time and voices no complaints.     Optivol thoracic impedance suggesting normal.   Prescribed: Spironolactone 25 mg take 0.5 tablet daily.   Labs: 02/19/2022 Creatinine 0.88, BUN 13, Potassium 4.4, Sodium 140, GFR 89 12/11/2021 Creatinine 0.89, BUN 19, Potassium 4.2, Sodium 138, GFR >60  11/26/2021 Creatinine 0.92, BUN 15, Potassium 3.9, Sodium 141, GFR >60  09/27/2021 Creatinine 0.88, BUN 16, Potassium 3.7, Sodium 140, GFR 82.91 09/18/2021 Creatinine 0.86, BUN 16, Potassium 4.3, Sodium 141   Recommendations: No changes and encouraged to call if experiencing any fluid symptoms.   Follow-up plan: ICM clinic phone appointment on 07/15/2022.   91 day device clinic remote transmission 08/01/2022.       EP/Cardiology Office Visits:   Recall 10/28/2022 with Dr Burt Knack.  Recall 07/07/2022 with Dr Burt Knack.   Copy of ICM check sent to Dr. Curt Bears.  3 month ICM trend: 06/10/2022.    12-14 Month ICM trend:     [name], RN 06/14/2022 3:56 PM

## 2022-06-24 ENCOUNTER — Other Ambulatory Visit: Payer: Self-pay | Admitting: Rheumatology

## 2022-06-24 NOTE — Telephone Encounter (Signed)
Next Visit: 07/23/2022  Last Visit: 02/19/2022  UDS:02/19/2022 c/w  Narc Agreement: 04/19/2021   Last Fill: 05/22/2022  Okay to refill tramadol?

## 2022-07-01 ENCOUNTER — Ambulatory Visit (INDEPENDENT_AMBULATORY_CARE_PROVIDER_SITE_OTHER): Payer: Medicare Other | Admitting: Pulmonary Disease

## 2022-07-01 ENCOUNTER — Encounter: Payer: Self-pay | Admitting: Pulmonary Disease

## 2022-07-01 VITALS — BP 140/80 | HR 81 | Ht 67.0 in | Wt 191.0 lb

## 2022-07-01 DIAGNOSIS — M25512 Pain in left shoulder: Secondary | ICD-10-CM | POA: Diagnosis not present

## 2022-07-01 DIAGNOSIS — G4733 Obstructive sleep apnea (adult) (pediatric): Secondary | ICD-10-CM | POA: Diagnosis not present

## 2022-07-01 DIAGNOSIS — M25522 Pain in left elbow: Secondary | ICD-10-CM | POA: Diagnosis not present

## 2022-07-01 NOTE — Progress Notes (Signed)
[name]    XN:[id number]    06-16-1945  Primary Care Physician:[name], [name], MD  Referring Physician: Biagio Borg, MD [address] [place],  [place] [address]  Chief complaint:   Patient seen today for obstructive sleep apnea  HPI:  Patient of Dr. Brendolyn [name]  Wanted to discuss follow-up for his obstructive sleep apnea Has been using CPAP for over 15 years  He was interested in purchasing a travel CPAP recently  Has been using CPAP nightly Tolerates it well He has a few machines that he uses whenever he travels but otherwise uses CPAP nightly  Current download from his machine shows that he is on an auto titrating machine at 5-15  Was diagnosed with moderate obstructive sleep apnea previously  Gets enough hours of rest Wakes up feeling like he has had a good nights rest   Outpatient Encounter Medications as of 07/01/2022  Medication Sig   acetaminophen (TYLENOL) 650 MG CR tablet Take 650 mg by mouth in the morning and at bedtime.   atorvastatin (LIPITOR) 20 MG tablet TAKE 1 TABLET BY MOUTH DAILY   b complex vitamins tablet Take 1 tablet by mouth daily with lunch.   betamethasone dipropionate 0.05 % cream SMARTSIG:1 sparingly Topical Twice Daily   carvedilol (COREG) 25 MG tablet TAKE 1 TABLET BY MOUTH TWICE DAILY   cholecalciferol (VITAMIN D3) 25 MCG (1000 UNIT) tablet Take 1,000 Units by mouth daily.   Coenzyme Q10 (CO Q 10 PO) Take 200 mg by mouth daily with lunch.   ELIQUIS 5 MG TABS tablet TAKE 1 TABLET BY MOUTH TWICE DAILY   Flaxseed, Linseed, (FLAXSEED OIL PO) Take 1 capsule by mouth daily. With Omega 3   fluocinonide (LIDEX) 0.05 % external solution APPLY 1 ML TOPICALLY TO THE SCALP TWICE DAILY   folic acid (FOLVITE) A999333 MCG tablet Take 400 mcg by mouth daily.   Glucosamine HCl (GLUCOSAMINE PO) Take 1,500 mg by mouth every evening.   hydrocortisone 2.5 % cream Apply 1 application topically 2 (two) times daily as needed (rash on nose).     hydroxychloroquine (PLAQUENIL) 200 MG tablet TAKE 1 TABLET BY MOUTH EVERY DAY ON MONDAY-FRIDAY ONLY, DO NOT TAKE ON SATURDAY OR SUNDAY   Lutein 20 MG TABS Take 20 mg by mouth daily.   Multiple Vitamin (MULTIVITAMIN) capsule Take 1 capsule by mouth daily.     nitroGLYCERIN (NITROSTAT) 0.4 MG SL tablet Place 1 tablet (0.4 mg total) under the tongue every 5 (five) minutes x 3 doses as needed for chest pain.   NON FORMULARY CPAP machine with sleep.   pantoprazole (PROTONIX) 40 MG tablet Take 1 tablet (40 mg total) by mouth daily. before breakfast   polyethylene glycol (MIRALAX / GLYCOLAX) 17 g packet Take 17 g by mouth daily as needed for mild constipation.   Probiotic Product (PROBIOTIC DAILY PO) Take 1 capsule by mouth daily with lunch.    RESTASIS 0.05 % ophthalmic emulsion Place 1 drop into both eyes 2 (two) times daily as needed (dry eyes).   sacubitril-valsartan (ENTRESTO) 49-51 MG Take 1 tablet by mouth 2 (two) times daily.   Saw Palmetto, Serenoa repens, (SAW PALMETTO PO) Take 450 mg by mouth every evening.   spironolactone (ALDACTONE) 25 MG tablet TAKE 1/2 TABLET(12.5 MG) BY MOUTH DAILY   tamsulosin (FLOMAX) 0.4 MG CAPS capsule Take 0.4 mg by mouth daily.   Testosterone 20 % CREA Apply 2 mLs topically daily. Rub on  shoulder   tobramycin-dexamethasone (TOBRADEX) ophthalmic ointment APPLY A SMALL AMOUNT ONTO ITCHING EYELID AREA 3X/DAY FOR 1 WEEK THEN DAILY FOR 1 WEEK THEN EVERY OTHER DAY FOR 2 WEEKS.   traMADol (ULTRAM) 50 MG tablet TAKE 1 TABLET BY MOUTH EVERY NIGHT AT BEDTIME AS NEEDED   TURMERIC PO Take 1 tablet by mouth daily with lunch.    vitamin C (ASCORBIC ACID) 500 MG tablet Take 500 mg by mouth daily.   zinc gluconate 50 MG tablet Take 50 mg by mouth daily.   No facility-administered encounter medications on file as of 07/01/2022.    Allergies as of 07/01/2022 - Review Complete 07/01/2022  Allergen Reaction Noted   Sulfonamide derivatives Rash 06/26/2010    Past Medical  History:  Diagnosis Date   AICD (automatic cardioverter/defibrillator) present    Allergic rhinitis    Allergy    Anginal pain ([place]) 05/26/14   chest pain after chasing dog   Atypical nevus of back 04/27/2003   moderate - mid lower back   Basal cell carcinoma 01/26/2008   right cheek - MOHs   CAD (coronary artery disease)    hx of stent- 2005 RCA   Cataract    beginning stage both eyes   CHF (congestive heart failure) (HCC)    pacemaker Medtronic     Chronic back pain    intermittent   Constipation    Dizziness    Dysrhythmia    right bundle branch block    Esophageal stricture    GERD (gastroesophageal reflux disease)    Hemoptysis    Hiatal hernia    History of colonic polyps    hyperplastic   HTN (hypertension)    Hyperlipidemia    Hypertrophy of prostate with urinary obstruction and other lower urinary tract symptoms (LUTS)    OA (osteoarthritis)    OSA (obstructive sleep apnea)    cpap- 10    Other specified disorder of stomach and duodenum    duodenal periampulary tubulovillous adenoma removed by Dr. Ardis Hughs 5/10   Pericarditis 07/06/2019   Pneumonia    Pre-diabetes    no medications   Shortness of breath    with exertion   Sleep apnea    cpap   Testicular hypofunction     Past Surgical History:  Procedure Laterality Date   BILIARY STENT PLACEMENT N/A 03/19/2021   Procedure: BILIARY STENT PLACEMENT;  Surgeon: Irving Copas., MD;  Location: [facility];  Service: Gastroenterology;  Laterality: N/A;   BIV ICD INSERTION CRT-D N/A 03/27/2017   Procedure: BIV ICD INSERTION CRT-D;  Surgeon: Constance Haw, MD;  Location: [facility];  Service: Cardiovascular;  Laterality: N/A;   CARDIAC CATHETERIZATION     '05, last 2009, showing patent RCA stent   COLONOSCOPY  12/2007   HYPERPLASTIC POLYP   COLONOSCOPY  12/2019   COLONOSCOPY WITH PROPOFOL N/A 06/02/2014   Procedure: COLONOSCOPY WITH PROPOFOL;  Surgeon: [name], MD;  Location: [facility];  Service: Endoscopy;  Laterality: N/A;   CORONARY ANGIOPLASTY  08/2003   ENDOSCOPIC MUCOSAL RESECTION  03/19/2021   Procedure: ENDOSCOPIC MUCOSAL RESECTION, ampullectomy;  Surgeon: Irving Copas., MD;  Location: [facility];  Service: Gastroenterology;;   ENDOSCOPIC RETROGRADE CHOLANGIOPANCREATOGRAPHY (ERCP) WITH PROPOFOL N/A 03/19/2021   Procedure: ENDOSCOPIC RETROGRADE CHOLANGIOPANCREATOGRAPHY (ERCP) WITH PROPOFOL;  Surgeon: Irving Copas., MD;  Location: [facility];  Service: Gastroenterology;  Laterality: N/A;   ENDOSCOPIC RETROGRADE CHOLANGIOPANCREATOGRAPHY (ERCP) WITH PROPOFOL N/A 11/29/2021   Procedure: ENDOSCOPIC RETROGRADE  CHOLANGIOPANCREATOGRAPHY (ERCP) WITH PROPOFOL;  Surgeon: Mansouraty, Telford Nab., MD;  Location: [facility];  Service: Gastroenterology;  Laterality: N/A;   ESOPHAGOGASTRODUODENOSCOPY (EGD) WITH PROPOFOL N/A 06/02/2014   Procedure: ESOPHAGOGASTRODUODENOSCOPY (EGD) WITH PROPOFOL;  Surgeon: [name], MD;  [facility]: [facility];  Service: Endoscopy;  Laterality: N/A;   ESOPHAGOGASTRODUODENOSCOPY (EGD) WITH PROPOFOL N/A 03/19/2021   Procedure: ESOPHAGOGASTRODUODENOSCOPY (EGD) WITH PROPOFOL;  Surgeon: Rush Landmark Telford Nab., MD;  Location: [facility];  Service: Gastroenterology;  Laterality: N/A;   ESOPHAGOGASTRODUODENOSCOPY (EGD) WITH PROPOFOL N/A 03/23/2021   Procedure: ESOPHAGOGASTRODUODENOSCOPY (EGD) WITH PROPOFOL;  Surgeon: Ladene Artist, MD;  Location: [facility];  Service: Endoscopy;  Laterality: N/A;   hernia surgery x 3     Bilateral Inguinal, Umbicial   INSERTION OF MESH N/A 03/20/2018   Procedure: INSERTION OF MESH;  Surgeon: Alphonsa Overall, MD;  Location: [facility];  Service: General;  Laterality: N/A;   IRRIGATION AND DEBRIDEMENT ABSCESS Left 08/14/2012   Procedure: IRRIGATION AND DEBRIDEMENT LEFT INGUINAL BOIL ;  Surgeon: Ailene Rud, MD;  Location: [facility];  Service: Urology;  Laterality: Left;   JOINT  REPLACEMENT Right 2017   KNEE ARTHROPLASTY     LEFT HEART CATH AND CORONARY ANGIOGRAPHY N/A 01/24/2019   Procedure: LEFT HEART CATH AND CORONARY ANGIOGRAPHY;  Surgeon: Martinique, Peter M, MD;  Location: [facility];  Service: Cardiovascular;  Laterality: N/A;   LEFT HEART CATHETERIZATION WITH CORONARY ANGIOGRAM N/A 05/26/2014   Procedure: LEFT HEART CATHETERIZATION WITH CORONARY ANGIOGRAM;  Surgeon: Blane Ohara, MD;  Location: [facility];  Service: Cardiovascular;  Laterality: N/A;   LUMBAR LAMINECTOMY/DECOMPRESSION MICRODISCECTOMY Left 09/26/2014   Procedure: Lumbar Laminectomy for resection of synovial cyst  Lumbar five- sacral one left;  Surgeon: Kary Kos, MD;  Location: [facility];  Service: Neurosurgery;  Laterality: Left;   MOLE REMOVAL Left 03/20/2018   Procedure: MOLE REMOVAL;  Surgeon: Alphonsa Overall, MD;  Location: [facility];  Service: General;  Laterality: Left;   Oral surg to removed growth from ?sinus  10/2010   ?Dermoid removed by [facility]   PACEMAKER INSERTION  04/03/2017   PANCREATIC STENT PLACEMENT  03/19/2021   Procedure: PANCREATIC STENT PLACEMENT;  Surgeon: Irving Copas., MD;  Location: [facility];  Service: Gastroenterology;;   POLYPECTOMY     RCA stenting     '05 RCA   REMOVAL OF STONES  11/29/2021   Procedure: REMOVAL OF SLUDGE;  Surgeon: Rush Landmark Telford Nab., MD;  Location: [facility];  Service: Gastroenterology;;   right toe surgery  Right    Cyst    RIGHT/LEFT HEART CATH AND CORONARY ANGIOGRAPHY N/A 01/15/2017   Procedure: RIGHT/LEFT HEART CATH AND CORONARY ANGIOGRAPHY;  Surgeon: [name], MD;  Location: [facility];  Service: Cardiovascular;  Laterality: N/A;   s/p right knee arthroscopy  2005   SPHINCTEROTOMY  03/19/2021   Procedure: SPHINCTEROTOMY;  Surgeon: Mansouraty, Telford Nab., MD;  Location: [facility];  Service: Gastroenterology;;   Lavell Islam REMOVAL  11/29/2021   Procedure: STENT REMOVAL;  Surgeon: Irving Copas., MD;   Location: [facility];  Service: Gastroenterology;;   SUBMUCOSAL LIFTING INJECTION  03/19/2021   Procedure: SUBMUCOSAL LIFTING INJECTION;  Surgeon: Irving Copas., MD;  Location: [facility];  Service: Gastroenterology;;   thumb surgery Right    TOTAL KNEE ARTHROPLASTY Right 03/12/2016   Procedure: RIGHT TOTAL KNEE ARTHROPLASTY;  Surgeon: Paralee Cancel, MD;  Location: [facility];  Service: Orthopedics;  Laterality: Right;   TOTAL KNEE ARTHROPLASTY Left 12/10/2021  Procedure: TOTAL KNEE ARTHROPLASTY;  Surgeon: Gaynelle Arabian, MD;  Location: [facility];  Service: Orthopedics;  Laterality: Left;   UPPER ESOPHAGEAL ENDOSCOPIC ULTRASOUND (EUS) N/A 03/19/2021   Procedure: UPPER ESOPHAGEAL ENDOSCOPIC ULTRASOUND (EUS);  Surgeon: Irving Copas., MD;  Location: [facility];  Service: Gastroenterology;  Laterality: N/A;   UPPER GASTROINTESTINAL ENDOSCOPY     VENTRAL HERNIA REPAIR N/A 03/20/2018   Procedure: LAPAROSCOPIC VENTRAL INCISIONAL  HERNIA ERAS PATHWAY;  Surgeon: Alphonsa Overall, MD;  Location: [facility];  Service: General;  Laterality: N/A;    Family History  Problem Relation Age of Onset   Heart disease Mother    Coronary artery disease Father    Diabetes Father    Sudden death Father        due to heart disease   Heart disease Father    COPD Brother    Arthritis Brother    Heart failure Brother    Cancer Brother        lung    Stomach cancer Paternal Grandmother    Arthritis Daughter    Hypertension Other    Colon cancer Neg Hx    Esophageal cancer Neg Hx    Colon polyps Neg Hx    Ulcerative colitis Neg Hx    Liver disease Neg Hx    Pancreatic cancer Neg Hx    Inflammatory bowel disease Neg Hx    Rectal cancer Neg Hx     Social History   Socioeconomic History   Marital status: Married    Spouse name: Not on file   Number of children: 1   Years of education: Not on file   Highest education level: Not on file  Occupational History   Occupation: Optometrist: [employer]  Tobacco Use   Smoking status: Former    Types: Cigarettes    Quit date: 05/20/1990    Years since quitting: 32.1    Passive exposure: Never   Smokeless tobacco: Never   Tobacco comments:    previous 30 pack year history  Vaping Use   Vaping Use: Never used  Substance and Sexual Activity   Alcohol use: Not Currently    Comment: rare   Drug use: Never   Sexual activity: Not on file  Other Topics Concern   Not on file  Social History Narrative   Not on file   Social Determinants of Health   Financial Resource Strain: Not on file  Food Insecurity: Not on file  Transportation Needs: Not on file  Physical Activity: Not on file  Stress: Not on file  Social Connections: Not on file  Intimate Partner Violence: Not on file    Review of Systems  Respiratory:  Positive for apnea.   Psychiatric/Behavioral:  Positive for sleep disturbance.     Vitals:   07/01/22 1519  BP: (!) 140/80  Pulse: 81  SpO2: 97%     Physical Exam Constitutional:      Appearance: Normal appearance.  HENT:     Head: Normocephalic.     Mouth/Throat:     Mouth: Mucous membranes are moist.  Eyes:     General: No scleral icterus.    Pupils: Pupils are equal, round, and reactive to light.  Cardiovascular:     Rate and Rhythm: Normal rate and regular rhythm.     Heart sounds: No murmur heard.    No friction rub.  Pulmonary:     Effort: No respiratory distress.  Breath sounds: No stridor. No wheezing or rhonchi.  Musculoskeletal:     Cervical back: No rigidity or tenderness.  Neurological:     Mental Status: He is alert.  Psychiatric:        Mood and Affect: Mood normal.       07/01/2022    3:00 PM  Results of the [facility] flowsheet  Sitting and reading 2  Watching TV 1  Sitting, inactive in a public place (e.g. a theatre or a meeting) 0  As a passenger in a car for an hour without a break 3  Lying down to rest in the afternoon when circumstances permit  3  Sitting and talking to someone 0  Sitting quietly after a lunch without alcohol 2  In a car, while stopped for a few minutes in traffic 2  Total score 13   Data Reviewed: Download from his machine shows good compliance There are gaps in the download from him using his other machine Usually sleeps about 7 hours 43 minutes AutoSet 5-15 95 percentile pressure of 9.1 Residual AHI of 0.2  Assessment:  Moderate obstructive sleep apnea, adequately treated with CPAP therapy  Continues to benefit from CPAP  Pathophysiology of sleep disordered breathing reviewed with the patient  Plan/Recommendations: Prescription for auto CPAP will be provided to patient today in order to procure travel CPAP  Encouraged to continue using the CPAP on a nightly basis  Encouraged to reach out for any concerns  Tentative follow-up in a year from now   Sherrilyn Rist MD [facility] 07/01/2022, 4:40 PM  CC: Biagio Borg, MD

## 2022-07-01 NOTE — Patient Instructions (Addendum)
I will see you a year from now  Continue using the CPAP on a nightly basis  Your current download shows that the machine is working well  We will give you a prescription for auto CPAP 5-15 which is the setting of your current machine  HumidX device-is a device that can be added to your system to provide moisture -It is not needed unless you feel you tolerate your CPAP better with humidification

## 2022-07-02 ENCOUNTER — Ambulatory Visit (HOSPITAL_COMMUNITY): Payer: Medicare Other | Attending: Cardiovascular Disease

## 2022-07-02 DIAGNOSIS — I5022 Chronic systolic (congestive) heart failure: Secondary | ICD-10-CM | POA: Diagnosis not present

## 2022-07-02 DIAGNOSIS — I480 Paroxysmal atrial fibrillation: Secondary | ICD-10-CM

## 2022-07-02 DIAGNOSIS — Z95810 Presence of automatic (implantable) cardiac defibrillator: Secondary | ICD-10-CM

## 2022-07-02 DIAGNOSIS — I2510 Atherosclerotic heart disease of native coronary artery without angina pectoris: Secondary | ICD-10-CM

## 2022-07-02 LAB — ECHOCARDIOGRAM COMPLETE
Area-P 1/2: 2.5 cm2
P 1/2 time: 498 msec
S' Lateral: 3.7 cm

## 2022-07-08 ENCOUNTER — Telehealth: Payer: Self-pay | Admitting: Cardiovascular Disease

## 2022-07-08 NOTE — Telephone Encounter (Signed)
Patient stated he read the results from his echo and it looks like his heart function is declining. Patient wanted to more detailed information pertaining to the final result from his echo. Will forward to MD and nurse.

## 2022-07-08 NOTE — Telephone Encounter (Signed)
Patient is returning call to discuss echo results. °

## 2022-07-09 NOTE — Progress Notes (Signed)
Office Visit Note  Patient: [name]             Date of Birth: [birth year]           MRN: [id number]             PCP: Biagio Borg, MD Referring: Biagio Borg, MD Visit Date: 07/23/2022 Occupation: '@GUAROCC'$ @  Subjective:  Pain and multiple joints  History of Present Illness: [name] is a 77 y.o. male with history of seropositive rheumatoid arthritis and osteoarthritis.  He states he is gradually recovering from left total knee replacement which he had in July 2023.  He continues to have pain and stiffness in multiple joints including his hands, knees, feet, lower back.  He takes hydroxychloroquine 200 mg 1 tablet p.o. Monday to Friday.  He has not noticed any joint swelling.  He takes tramadol 50 mg p.o. nightly for chronic pain which helps.  None of the other joints are painful.    Activities of Daily Living:  Patient reports morning stiffness for 5 minutes.   Patient Denies nocturnal pain.  Difficulty dressing/grooming: Denies Difficulty climbing stairs: Denies Difficulty getting out of chair: Denies Difficulty using hands for taps, buttons, cutlery, and/or writing: Reports  Review of Systems  Constitutional:  Positive for fatigue.  HENT: Negative.  Negative for mouth sores and mouth dryness.   Eyes:  Positive for dryness.  Respiratory:  Negative for difficulty breathing.   Cardiovascular: Negative.  Negative for chest pain and palpitations.  Gastrointestinal: Negative.  Negative for blood in stool, constipation and diarrhea.  Endocrine: Negative.  Negative for increased urination.  Genitourinary: Negative.  Negative for involuntary urination.  Musculoskeletal:  Positive for joint pain, joint pain, joint swelling, myalgias, muscle weakness, morning stiffness, muscle tenderness and myalgias. Negative for gait problem.  Skin: Negative.  Negative for pallor, rash, hair loss and sensitivity to sunlight.  Allergic/Immunologic: Negative.  Negative for susceptible to  infections.  Neurological: Negative.  Negative for dizziness and headaches.  Hematological: Negative.  Negative for swollen glands.  Psychiatric/Behavioral: Negative.  Negative for depressed mood and sleep disturbance. The patient is not nervous/anxious.     PMFS History:  Patient Active Problem List   Diagnosis Date Noted   OSA (obstructive sleep apnea) 05/30/2022   Rhinorrhea 05/30/2022   Travel advice encounter 05/30/2022   Inflammation of sacroiliac joint ([place]) 05/29/2022   Encounter for well adult exam with abnormal findings 05/29/2022   Pain in joint of left knee 12/13/2021   Osteoarthritis of left knee 12/10/2021   Left hip pain 09/30/2021   Chronic anticoagulation 08/31/2021   History of ERCP 08/31/2021   History of biliary stent insertion 08/31/2021   Pancreatic cyst 08/31/2021   Trochanteric bursitis of left hip 06/23/2021   GI bleed 03/23/2021   Melena    Acute upper GI bleed 03/22/2021   AF (paroxysmal atrial fibrillation) ([place]) 03/22/2021   Acquired hallux rigidus of left foot 02/14/2021   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   Abnormal findings on esophagogastroduodenoscopy (EGD) 02/03/2021   Polyp of duodenum 02/03/2021   Axillary abscess 06/26/2020   Chronic diarrhea 06/26/2020   Fever 01/18/2020   Pericarditis 07/06/2019   Right hip pain 07/06/2019   Acute chest pain 01/24/2019   Prediabetes 12/24/2018   Rheumatoid arthritis ([place]) 12/24/2018   Preventative health care 06/25/2018   Incarcerated ventral hernia 03/20/2018   DCM (dilated cardiomyopathy) ([place]) [id number]   Chronic systolic heart failure ([place]) 01/15/2017   History of pneumonia 10/23/2016  OA (osteoarthritis) of knee 10/23/2016   Primary osteoarthritis of both feet 10/04/2016   Primary osteoarthritis of left knee 10/04/2016   DDD (degenerative disc disease), lumbar 10/04/2016   Hemoptysis 05/27/2016   Overweight (BMI 25.0-29.9) 03/14/2016   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   Degeneration of intervertebral  disc of cervical spine without prolapsed disc 10/05/2015   Parotid mass 08/23/2015   Dyspnea 11/29/2014   Synovial cyst of lumbar facet joint 09/26/2014   Incisional hernia, without obstruction or gangrene 12/06/2013   Nausea alone 08/27/2013   Colon cancer screening 08/27/2013   Spinal stenosis 04/23/2013   Displacement of lumbar intervertebral disc without myelopathy 12/01/2012   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   Benign prostatic hyperplasia with urinary obstruction 06/18/2010   Other specified disorder of stomach and duodenum 12/16/2008   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 11/15/2008   Gastroesophageal reflux disease 09/02/2008   COLONIC POLYPS, HYPERPLASTIC 05/24/2007   OSA on CPAP 05/24/2007   ALLERGIC RHINITIS 05/24/2007   ESOPHAGEAL STRICTURE 05/24/2007   HIATAL HERNIA 05/24/2007   Hyperlipidemia 02/06/2007   Essential hypertension 02/06/2007   Coronary artery disease involving native coronary artery of native heart without angina pectoris 02/06/2007   Primary osteoarthritis of both hands 02/06/2007   Neck pain on right side 02/06/2007    Past Medical History:  Diagnosis Date   AICD (automatic cardioverter/defibrillator) present    Allergic rhinitis    Allergy    Anginal pain ([place]) 05/26/14   chest pain after chasing dog   Atypical nevus of back 04/27/2003   moderate - mid lower back   Basal cell carcinoma 01/26/2008   right cheek - MOHs   CAD (coronary artery disease)    hx of stent- 2005 RCA   Cataract    beginning stage both eyes   CHF (congestive heart failure) ([facility])    pacemaker Medtronic     Chronic back pain    intermittent   Constipation    Dizziness    Dysrhythmia    right bundle branch block    Esophageal stricture    GERD (gastroesophageal reflux disease)    Hemoptysis    Hiatal hernia    History of colonic polyps    hyperplastic   HTN (hypertension)    Hyperlipidemia    Hypertrophy of prostate with urinary obstruction and other  lower urinary tract symptoms (LUTS)    OA (osteoarthritis)    OSA (obstructive sleep apnea)    cpap- 10    Other specified disorder of stomach and duodenum    duodenal periampulary tubulovillous adenoma removed by Dr. Ardis Hughs 5/10   Pericarditis 07/06/2019   Pneumonia    Pre-diabetes    no medications   Shortness of breath    with exertion   Sleep apnea    cpap   Testicular hypofunction     Family History  Problem Relation Age of Onset   Heart disease Mother    Coronary artery disease Father    Diabetes Father    Sudden death Father        due to heart disease   Heart disease Father    COPD Brother    Arthritis Brother    Heart failure Brother    Cancer Brother        lung    Stomach cancer Paternal Grandmother    Arthritis Daughter    Hypertension Other    Colon cancer Neg Hx    Esophageal cancer Neg Hx    Colon polyps Neg  Hx    Ulcerative colitis Neg Hx    Liver disease Neg Hx    Pancreatic cancer Neg Hx    Inflammatory bowel disease Neg Hx    Rectal cancer Neg Hx    Past Surgical History:  Procedure Laterality Date   BILIARY STENT PLACEMENT N/A 03/19/2021   Procedure: BILIARY STENT PLACEMENT;  Surgeon: Irving Copas., MD;  [facility];  Service: Gastroenterology;  Laterality: N/A;   BIV ICD INSERTION CRT-D N/A 03/27/2017   Procedure: BIV ICD INSERTION CRT-D;  Surgeon: Constance Haw, MD;  Location: [facility];  Service: Cardiovascular;  Laterality: N/A;   CARDIAC CATHETERIZATION     '05, last 2009, showing patent RCA stent   COLONOSCOPY  12/2007   HYPERPLASTIC POLYP   COLONOSCOPY  12/2019   COLONOSCOPY WITH PROPOFOL N/A 06/02/2014   Procedure: COLONOSCOPY WITH PROPOFOL;  Surgeon: [name], MD;  [facility];  Service: Endoscopy;  Laterality: N/A;   CORONARY ANGIOPLASTY  08/2003   ENDOSCOPIC MUCOSAL RESECTION  03/19/2021   Procedure: ENDOSCOPIC MUCOSAL RESECTION, ampullectomy;  Surgeon: Irving Copas., MD;   [facility];  Service: Gastroenterology;;   ENDOSCOPIC RETROGRADE CHOLANGIOPANCREATOGRAPHY (ERCP) WITH PROPOFOL N/A 03/19/2021   Procedure: ENDOSCOPIC RETROGRADE CHOLANGIOPANCREATOGRAPHY (ERCP) WITH PROPOFOL;  Surgeon: Irving Copas., MD;  Location: [facility];  Service: Gastroenterology;  Laterality: N/A;   ENDOSCOPIC RETROGRADE CHOLANGIOPANCREATOGRAPHY (ERCP) WITH PROPOFOL N/A 11/29/2021   Procedure: ENDOSCOPIC RETROGRADE CHOLANGIOPANCREATOGRAPHY (ERCP) WITH PROPOFOL;  Surgeon: Rush Landmark Telford Nab., MD;  Location: [facility];  Service: Gastroenterology;  Laterality: N/A;   ESOPHAGOGASTRODUODENOSCOPY (EGD) WITH PROPOFOL N/A 06/02/2014   Procedure: ESOPHAGOGASTRODUODENOSCOPY (EGD) WITH PROPOFOL;  Surgeon: [name], MD;  [facility];  Service: Endoscopy;  Laterality: N/A;   ESOPHAGOGASTRODUODENOSCOPY (EGD) WITH PROPOFOL N/A 03/19/2021   Procedure: ESOPHAGOGASTRODUODENOSCOPY (EGD) WITH PROPOFOL;  Surgeon: Rush Landmark Telford Nab., MD;  [facility];  Service: Gastroenterology;  Laterality: N/A;   ESOPHAGOGASTRODUODENOSCOPY (EGD) WITH PROPOFOL N/A 03/23/2021   Procedure: ESOPHAGOGASTRODUODENOSCOPY (EGD) WITH PROPOFOL;  Surgeon: Ladene Artist, MD;  [facility];  Service: Endoscopy;  Laterality: N/A;   hernia surgery x 3     Bilateral Inguinal, Umbicial   INSERTION OF MESH N/A 03/20/2018   Procedure: INSERTION OF MESH;  Surgeon: Alphonsa Overall, MD;  Location: [facility];  Service: General;  Laterality: N/A;   IRRIGATION AND DEBRIDEMENT ABSCESS Left 08/14/2012   Procedure: IRRIGATION AND DEBRIDEMENT LEFT INGUINAL BOIL ;  Surgeon: Ailene Rud, MD;  [facility];  Service: Urology;  Laterality: Left;   JOINT REPLACEMENT Right 2017   KNEE ARTHROPLASTY     LEFT HEART CATH AND CORONARY ANGIOGRAPHY N/A 01/24/2019   Procedure: LEFT HEART CATH AND CORONARY ANGIOGRAPHY;  Surgeon: Martinique, Peter M, MD;  Location: [facility];  Service:  Cardiovascular;  Laterality: N/A;   LEFT HEART CATHETERIZATION WITH CORONARY ANGIOGRAM N/A 05/26/2014   Procedure: LEFT HEART CATHETERIZATION WITH CORONARY ANGIOGRAM;  Surgeon: Blane Ohara, MD;  Location: [facility];  Service: Cardiovascular;  Laterality: N/A;   LUMBAR LAMINECTOMY/DECOMPRESSION MICRODISCECTOMY Left 09/26/2014   Procedure: Lumbar Laminectomy for resection of synovial cyst  Lumbar five- sacral one left;  Surgeon: Kary Kos, MD;  Location: [facility];  Service: Neurosurgery;  Laterality: Left;   MOLE REMOVAL Left 03/20/2018   Procedure: MOLE REMOVAL;  Surgeon: Alphonsa Overall, MD;  Location: [facility];  Service: General;  Laterality: Left;   Oral surg to removed growth from ?sinus  10/2010   ?Dermoid removed by DrRiggs   PACEMAKER INSERTION  04/03/2017   PANCREATIC STENT PLACEMENT  03/19/2021   Procedure: PANCREATIC STENT PLACEMENT;  Surgeon: Irving Copas., MD;  [facility];  Service: Gastroenterology;;   POLYPECTOMY     RCA stenting     '05 RCA   REMOVAL OF STONES  11/29/2021   Procedure: REMOVAL OF SLUDGE;  Surgeon: Rush Landmark Telford Nab., MD;  Location: [facility];  Service: Gastroenterology;;   right toe surgery  Right    Cyst    RIGHT/LEFT HEART CATH AND CORONARY ANGIOGRAPHY N/A 01/15/2017   Procedure: RIGHT/LEFT HEART CATH AND CORONARY ANGIOGRAPHY;  Surgeon: [name], MD;  Location: [facility];  Service: Cardiovascular;  Laterality: N/A;   s/p right knee arthroscopy  2005   SPHINCTEROTOMY  03/19/2021   Procedure: SPHINCTEROTOMY;  Surgeon: Mansouraty, Telford Nab., MD;  Location: [facility];  Service: Gastroenterology;;   Lavell Islam REMOVAL  11/29/2021   Procedure: STENT REMOVAL;  Surgeon: Irving Copas., MD;  Location: [facility];  Service: Gastroenterology;;   SUBMUCOSAL LIFTING INJECTION  03/19/2021   Procedure: SUBMUCOSAL LIFTING INJECTION;  Surgeon: Irving Copas., MD;  Location: [facility];  Service:  Gastroenterology;;   thumb surgery Right    TOTAL KNEE ARTHROPLASTY Right 03/12/2016   Procedure: RIGHT TOTAL KNEE ARTHROPLASTY;  Surgeon: Paralee Cancel, MD;  [facility];  Service: Orthopedics;  Laterality: Right;   TOTAL KNEE ARTHROPLASTY Left 12/10/2021   Procedure: TOTAL KNEE ARTHROPLASTY;  Surgeon: Gaynelle Arabian, MD;  [facility];  Service: Orthopedics;  Laterality: Left;   UPPER ESOPHAGEAL ENDOSCOPIC ULTRASOUND (EUS) N/A 03/19/2021   Procedure: UPPER ESOPHAGEAL ENDOSCOPIC ULTRASOUND (EUS);  Surgeon: Irving Copas., MD;  Location: [facility];  Service: Gastroenterology;  Laterality: N/A;   UPPER GASTROINTESTINAL ENDOSCOPY     VENTRAL HERNIA REPAIR N/A 03/20/2018   Procedure: LAPAROSCOPIC VENTRAL INCISIONAL  HERNIA ERAS PATHWAY;  Surgeon: Alphonsa Overall, MD;  Location: [facility];  Service: General;  Laterality: N/A;   Social History   Social History Narrative   Not on file   Immunization History  Administered Date(s) Administered   Fluad Quad(high Dose 65+) 02/18/2019, 02/21/2022   Influenza Split 03/21/2011   Influenza Whole 04/06/2009, 04/09/2010   Influenza, High Dose Seasonal PF 04/20/2015, 04/15/2016, 02/21/2017   Influenza,inj,Quad PF,6+ Mos 03/24/2014   Influenza-Unspecified 02/26/2018, 02/18/2019, 02/26/2020   PFIZER(Purple Top)SARS-COV-2 Vaccination 05/27/2019, 06/17/2019, 02/15/2020   Pfizer Covid-19 Vaccine Bivalent Booster 70yr & up 02/21/2022   Pneumococcal Conjugate-13 11/29/2014   Pneumococcal Polysaccharide-23 06/25/2018   Rsv, Mab, NKathie Rhodes 0.5 Ml, Neonate To 24 Mos(Beyfortus) 02/21/2022   Tdap 11/29/2014   Unspecified SARS-COV-2 Vaccination 02/03/2022   Zoster Recombinat (Shingrix) 06/07/2018, 09/26/2018   Zoster, Live 05/20/2006     Objective: Vital Signs: BP 124/70 (BP Location: Left Arm, Patient Position: Sitting, Cuff Size: Normal)   Pulse 76   Resp 14   Ht '5\' 7"'$  (1.702 m)   Wt 188 lb 9.6 oz (85.5 kg)   BMI 29.54 kg/m     Physical Exam Vitals and nursing note reviewed.  Constitutional:      Appearance: He is well-developed.  HENT:     Head: Normocephalic and atraumatic.  Eyes:     Conjunctiva/sclera: Conjunctivae normal.     Pupils: Pupils are equal, round, and reactive to light.  Cardiovascular:     Rate and Rhythm: Normal rate and regular rhythm.     Heart sounds: Normal heart sounds.  Pulmonary:  Effort: Pulmonary effort is normal.     Breath sounds: Normal breath sounds.  Abdominal:     General: Bowel sounds are normal.     Palpations: Abdomen is soft.  Musculoskeletal:     Cervical back: Normal range of motion and neck supple.  Skin:    General: Skin is warm and dry.     Capillary Refill: Capillary refill takes less than 2 seconds.  Neurological:     Mental Status: He is alert and oriented to person, place, and time.  Psychiatric:        Behavior: Behavior normal.      Musculoskeletal Exam: He had limited lateral rotation of the cervical spine.  He had painful limited range of motion of the lumbar spine.  Shoulder joints, elbow joints, wrist joints with good range of motion.  He had thickening of bilateral second MCP joints with no synovitis.  He had bilateral PIP and DIP thickening and subluxation of several of the PIP and DIP joints with no synovitis.  Hip joints were in good range of motion.  Bilateral knee joints were replaced and warm to touch.  He had no tenderness on palpation over ankles or MTPs.  CDAI Exam: CDAI Score: -- Patient Global: 2 mm; Provider Global: 2 mm Swollen: --; Tender: -- Joint Exam 07/23/2022   No joint exam has been documented for this visit   There is currently no information documented on the homunculus. Go to the Rheumatology activity and complete the homunculus joint exam.  Investigation: No additional findings.  Imaging: ECHOCARDIOGRAM COMPLETE  Result Date: 07/02/2022    ECHOCARDIOGRAM REPORT   Patient Name:   [name] Date of Exam:  07/02/2022 Medical Rec #:  [id number]      Height:       67.0 in Accession #:    [id number]     Weight:       191.0 lb Date of Birth:  04-Aug-1945     BSA:          1.983 m Patient Age:    76 years       BP:           110/70 mmHg Patient Gender: M              HR:           75 bpm. Exam Location:  [address] Procedure: 2D Echo, Cardiac Doppler and Color Doppler Indications:    I50.22 CHF  History:        Patient has prior history of Echocardiogram examinations, most                 recent 06/22/2020. CHF, CAD, ICD, Arrythmias:Atrial Fibrillation                 and LBBB; Risk Factors:Hypertension and Dyslipidemia.  Sonographer:    Coralyn Helling RDCS Referring Phys: [place]  Sonographer Comments: Technically difficult study due to poor echo windows. IMPRESSIONS  1. Left ventricular ejection fraction, by estimation, is 50 to 55%. The left ventricle has low normal function. The left ventricle has no regional wall motion abnormalities. There is moderate concentric left ventricular hypertrophy. Left ventricular diastolic parameters are consistent with Grade I diastolic dysfunction (impaired relaxation).  2. Right ventricular systolic function is normal. The right ventricular size is normal. There is normal pulmonary artery systolic pressure.  3. Left atrial size was moderately dilated.  4. The mitral valve is normal in structure. Trivial mitral valve  regurgitation. No evidence of mitral stenosis.  5. The aortic valve is tricuspid. Aortic valve regurgitation is trivial. No aortic stenosis is present.  6. The inferior vena cava is normal in size with greater than 50% respiratory variability, suggesting right atrial pressure of 3 mmHg. FINDINGS  Left Ventricle: Left ventricular ejection fraction, by estimation, is 50 to 55%. The left ventricle has low normal function. The left ventricle has no regional wall motion abnormalities. The left ventricular internal cavity size was normal in size. There is moderate  concentric left ventricular hypertrophy. Left ventricular diastolic parameters are consistent with Grade I diastolic dysfunction (impaired relaxation). Indeterminate filling pressures. Right Ventricle: The right ventricular size is normal. No increase in right ventricular wall thickness. Right ventricular systolic function is normal. There is normal pulmonary artery systolic pressure. The tricuspid regurgitant velocity is 2.50 m/s, and  with an assumed right atrial pressure of 3 mmHg, the estimated right ventricular systolic pressure is [id number] mmHg. Left Atrium: Left atrial size was moderately dilated. Right Atrium: Right atrial size was normal in size. Pericardium: There is no evidence of pericardial effusion. Mitral Valve: The mitral valve is normal in structure. Trivial mitral valve regurgitation. No evidence of mitral valve stenosis. Tricuspid Valve: The tricuspid valve is normal in structure. Tricuspid valve regurgitation is mild . No evidence of tricuspid stenosis. Aortic Valve: The aortic valve is tricuspid. Aortic valve regurgitation is trivial. Aortic regurgitation PHT measures 498 msec. No aortic stenosis is present. Pulmonic Valve: The pulmonic valve was normal in structure. Pulmonic valve regurgitation is mild. No evidence of pulmonic stenosis. Aorta: The aortic root is normal in size and structure. Venous: The inferior vena cava is normal in size with greater than 50% respiratory variability, suggesting right atrial pressure of 3 mmHg. IAS/Shunts: No atrial level shunt detected by color flow Doppler.  LEFT VENTRICLE PLAX 2D LVIDd:         5.40 cm   Diastology LVIDs:         3.70 cm   LV e' medial:    5.11 cm/s LV PW:         1.50 cm   LV E/e' medial:  12.6 LV IVS:        1.40 cm   LV e' lateral:   5.22 cm/s LVOT diam:     2.50 cm   LV E/e' lateral: 12.4 LV SV:         69 LV SV Index:   35 LVOT Area:     4.91 cm  RIGHT VENTRICLE            IVC RVSP:           28.0 mmHg  IVC diam: 1.10 cm LEFT ATRIUM              Index        RIGHT ATRIUM           Index LA diam:        4.30 cm 2.17 cm/m   RA Pressure: 3.00 mmHg LA Vol (A2C):   74.3 ml 37.47 ml/m  RA Area:     13.20 cm LA Vol (A4C):   56.5 ml 28.49 ml/m  RA Volume:   28.30 ml  14.27 ml/m LA Biplane Vol: 66.5 ml 33.53 ml/m  AORTIC VALVE LVOT Vmax:   67.10 cm/s LVOT Vmean:  45.600 cm/s LVOT VTI:    0.140 m AI PHT:      498 msec  AORTA Ao Root diam: 3.50 cm  Ao Asc diam:  3.30 cm MITRAL VALVE               TRICUSPID VALVE MV Area (PHT): 2.50 cm    TR Peak grad:   25.0 mmHg MV Decel Time: 304 msec    TR Vmax:        250.00 cm/s MV E velocity: 64.50 cm/s  Estimated RAP:  3.00 mmHg MV A velocity: 87.00 cm/s  RVSP:           28.0 mmHg MV E/A ratio:  0.74                            SHUNTS                            Systemic VTI:  0.14 m                            Systemic Diam: 2.50 cm Skeet Latch MD Electronically signed by Skeet Latch MD Signature Date/Time: 07/02/2022/1:27:59 PM    Final     Recent Labs: Lab Results  Component Value Date   WBC 10.2 05/29/2022   HGB 15.3 05/29/2022   PLT 277.0 05/29/2022   NA 138 05/29/2022   K 4.4 05/29/2022   CL 103 05/29/2022   CO2 26 05/29/2022   GLUCOSE 103 (H) 05/29/2022   BUN 25 (H) 05/29/2022   CREATININE 1.01 05/29/2022   BILITOT 1.2 05/29/2022   ALKPHOS 86 05/29/2022   AST 15 05/29/2022   ALT 17 05/29/2022   PROT 7.5 05/29/2022   ALBUMIN 4.7 05/29/2022   CALCIUM 10.5 05/29/2022   GFRAA 100 11/09/2020    Speciality Comments: PLQ eye exam: 03/14/2021 normal. [facility] Follow up 09/12/2021.  Procedures:  No procedures performed Allergies: Sulfonamide derivatives   Assessment / Plan:     Visit Diagnoses: Seropositive rheumatoid arthritis ([place]) RF-, anti-CCP+ - Severe erosive disease-burned-out.  Patient had no synovitis on my examination although he had severe rheumatoid arthritis and osteoarthritis overlap.  He had bilateral MCP thickening and DIP and PIP thickening with  subluxation.  He had incomplete fist formation limited extension of his fingers.  High risk medication use - Plaquenil 200 mg p.o. twice daily Monday to Friday.  Plaquenil eye examination September 12, 2021 at [facility].  Patient will be scheduling his follow-up eye examination in April.  Labs obtained on May 29, 2022 CBC and CMP were normal which were reviewed with the patient.  His rheumatoid factor was positive.  Information on immunization was placed in the AVS.    Medication monitoring encounter -he has been taking tramadol 50 mg p.o. p.o. nightly as needed pain.  He is unable to taper off tramadol.  UDS February 19, 2022, narcotic agreement - Plan: DRUG MONITOR, TRAMADOL,QN, URINE, DRUG MONITOR, PANEL 5, W/CONF, URINE.  Prescription refill for tramadol was given today.  Primary osteoarthritis of both hands - Severe osteoarthritis and rheumatoid arthritis overlap without synovitis.  He continues to have chronic discomfort.  Pain is manageable with tramadol.  Status post bilateral knee replacements - LTR July 2023.  He has been doing well although he continues to have chronic discomfort.  Warmth was noted on the examination of bilateral knee joints.  Primary osteoarthritis of both feet - Followed by Dr. Doran Durand.  According the patient he suggested first MTP surgery.  He was had no tenderness over ankles or MTPs.  Chronic left SI joint pain-chronic pain  Trochanteric bursitis, left hip-he has intermittent discomfort.  He did not have much tenderness on the palpation today.  DDD (degenerative disc disease), lumbar-he is chronic discomfort in his lower back.  He denies any radiculopathy.  Osteopenia of multiple sites - May 02, 2021 DEXA scan showed T-score of -1.6 BMD 0.706 in the left femoral neck.  Calcium rich diet and vitamin D was advised.  Other medical problems are listed as follows:  History of coronary artery disease  History of hypertension-blood pressure was normal  today at 124/70.  History of CHF (congestive heart failure)  Pacemaker  History of hyperlipidemia  History of gastroesophageal reflux (GERD)  History of gastric polyp  History of colon polyps  History of sleep apnea  History of BPH  Orders: Orders Placed This Encounter  Procedures   DRUG MONITOR, TRAMADOL,QN, URINE   DRUG MONITOR, PANEL 5, W/CONF, URINE   Meds ordered this encounter  Medications   traMADol (ULTRAM) 50 MG tablet    Sig: Take 1 tablet (50 mg total) by mouth at bedtime as needed.    Dispense:  30 tablet    Refill:  0     Follow-Up Instructions: Return in about 6 months (around 01/23/2023) for Rheumatoid arthritis, Osteoarthritis.   Bo Merino, MD  Note - This record has been created using Editor, commissioning.  Chart creation errors have been sought, but may not always  have been located. Such creation errors do not reflect on  the standard of medical care.

## 2022-07-09 NOTE — Telephone Encounter (Signed)
Called and left detailed message for patient per DPR.    [name], MD 07/05/2022  4:14 PM EST     LVEF 50 to 55%, stable from past echo study with no change in ventricular function.  No significant valvular disease noted.  Continue current management and plans for follow-up.   Prior ECHO was done 06/22/20 and showed same EF and same amount (moderate) LVH. Explained to patient and advised to call office back if further questions.

## 2022-07-11 DIAGNOSIS — S46119A Strain of muscle, fascia and tendon of long head of biceps, unspecified arm, initial encounter: Secondary | ICD-10-CM | POA: Insufficient documentation

## 2022-07-11 DIAGNOSIS — S46112D Strain of muscle, fascia and tendon of long head of biceps, left arm, subsequent encounter: Secondary | ICD-10-CM | POA: Diagnosis not present

## 2022-07-15 ENCOUNTER — Ambulatory Visit: Payer: Medicare Other | Attending: Cardiology

## 2022-07-15 DIAGNOSIS — Z95810 Presence of automatic (implantable) cardiac defibrillator: Secondary | ICD-10-CM

## 2022-07-15 DIAGNOSIS — I5022 Chronic systolic (congestive) heart failure: Secondary | ICD-10-CM | POA: Diagnosis not present

## 2022-07-19 NOTE — Progress Notes (Signed)
EPIC Encounter for ICM Monitoring  Patient Name: [name] is a 77 y.o. male Date: 07/19/2022 Primary Care Physican: Biagio Borg, MD Primary Cardiologist: Burt Knack Electrophysiologist: Curt Bears Bi-V Pacing:   937%  Effective 92.1%      07/04/2021 Weight: 197 lbs  10/17/2021 Weight: 190 lbs 11/21/2021 Weight: 190 lbs 04/15/2022 Weight: 190 lbs 05/01/2022 Office Weight: 193 lbs   Time in AT/AF  0.0 hr/day (0.0%)                                                            Spoke with patient and transmission results reviewed.  Pt asymptomatic for fluid accumulation.  Reports feeling well at this time and voices no complaints.  [place].   Optivol thoracic impedance suggesting normal fluid levels.   Prescribed: Spironolactone 25 mg take 0.5 tablet daily.   Labs: 02/19/2022 Creatinine 0.88, BUN 13, Potassium 4.4, Sodium 140, GFR 89 12/11/2021 Creatinine 0.89, BUN 19, Potassium 4.2, Sodium 138, GFR >60  11/26/2021 Creatinine 0.92, BUN 15, Potassium 3.9, Sodium 141, GFR >60  09/27/2021 Creatinine 0.88, BUN 16, Potassium 3.7, Sodium 140, GFR 82.91 09/18/2021 Creatinine 0.86, BUN 16, Potassium 4.3, Sodium 141   Recommendations: No changes and encouraged to call if experiencing any fluid symptoms.   Follow-up plan: ICM clinic phone appointment on 08/19/2022.   91 day device clinic remote transmission 08/01/2022.       EP/Cardiology Office Visits:   Recall 10/28/2022 with Dr Burt Knack.  Recall 07/07/2022 with Dr Curt Bears.  Provided EP scheduler number for appt with Dr Curt Bears.   Copy of ICM check sent to Dr. Curt Bears.   3 month ICM trend: 07/15/2022.    12-14 Month ICM trend:     [name], RN 07/19/2022 1:42 PM

## 2022-07-23 ENCOUNTER — Encounter: Payer: Self-pay | Admitting: Rheumatology

## 2022-07-23 ENCOUNTER — Ambulatory Visit: Payer: Medicare Other | Attending: Rheumatology | Admitting: Rheumatology

## 2022-07-23 VITALS — BP 124/70 | HR 76 | Resp 14 | Ht 67.0 in | Wt 188.6 lb

## 2022-07-23 DIAGNOSIS — Z87438 Personal history of other diseases of male genital organs: Secondary | ICD-10-CM

## 2022-07-23 DIAGNOSIS — Z79899 Other long term (current) drug therapy: Secondary | ICD-10-CM | POA: Diagnosis not present

## 2022-07-23 DIAGNOSIS — Z5181 Encounter for therapeutic drug level monitoring: Secondary | ICD-10-CM | POA: Diagnosis not present

## 2022-07-23 DIAGNOSIS — M19072 Primary osteoarthritis, left ankle and foot: Secondary | ICD-10-CM | POA: Diagnosis not present

## 2022-07-23 DIAGNOSIS — M19042 Primary osteoarthritis, left hand: Secondary | ICD-10-CM

## 2022-07-23 DIAGNOSIS — G8929 Other chronic pain: Secondary | ICD-10-CM

## 2022-07-23 DIAGNOSIS — M533 Sacrococcygeal disorders, not elsewhere classified: Secondary | ICD-10-CM | POA: Diagnosis not present

## 2022-07-23 DIAGNOSIS — M7062 Trochanteric bursitis, left hip: Secondary | ICD-10-CM | POA: Diagnosis not present

## 2022-07-23 DIAGNOSIS — Z86010 Personal history of colonic polyps: Secondary | ICD-10-CM | POA: Diagnosis not present

## 2022-07-23 DIAGNOSIS — M5136 Other intervertebral disc degeneration, lumbar region: Secondary | ICD-10-CM | POA: Diagnosis not present

## 2022-07-23 DIAGNOSIS — Z950 Presence of cardiac pacemaker: Secondary | ICD-10-CM | POA: Diagnosis not present

## 2022-07-23 DIAGNOSIS — Z8669 Personal history of other diseases of the nervous system and sense organs: Secondary | ICD-10-CM | POA: Diagnosis not present

## 2022-07-23 DIAGNOSIS — M8589 Other specified disorders of bone density and structure, multiple sites: Secondary | ICD-10-CM

## 2022-07-23 DIAGNOSIS — I2510 Atherosclerotic heart disease of native coronary artery without angina pectoris: Secondary | ICD-10-CM

## 2022-07-23 DIAGNOSIS — M059 Rheumatoid arthritis with rheumatoid factor, unspecified: Secondary | ICD-10-CM

## 2022-07-23 DIAGNOSIS — Z8679 Personal history of other diseases of the circulatory system: Secondary | ICD-10-CM | POA: Diagnosis not present

## 2022-07-23 DIAGNOSIS — M19041 Primary osteoarthritis, right hand: Secondary | ICD-10-CM | POA: Diagnosis not present

## 2022-07-23 DIAGNOSIS — Z8639 Personal history of other endocrine, nutritional and metabolic disease: Secondary | ICD-10-CM

## 2022-07-23 DIAGNOSIS — Z96653 Presence of artificial knee joint, bilateral: Secondary | ICD-10-CM | POA: Diagnosis not present

## 2022-07-23 DIAGNOSIS — Z8719 Personal history of other diseases of the digestive system: Secondary | ICD-10-CM | POA: Diagnosis not present

## 2022-07-23 DIAGNOSIS — M19071 Primary osteoarthritis, right ankle and foot: Secondary | ICD-10-CM

## 2022-07-23 MED ORDER — TRAMADOL HCL 50 MG PO TABS: 50.0000 mg | ORAL_TABLET | Freq: Every evening | ORAL | 0 refills | Status: DC | PRN

## 2022-07-23 NOTE — Progress Notes (Unsigned)
Electrophysiology Office Note   Date:  07/24/2022   ID:  [name], DOB 05/07/1946, MRN [id number]  PCP:  Biagio Borg, MD  Cardiologist:  Burt Knack Primary Electrophysiologist:  Dr Curt Bears    CC: Follow up for device check/new onset afib.   History of Present Illness: [name] is a 77 y.o. male who is being seen today for the evaluation of CHF at the request of Biagio Borg, MD. Presenting today for electrophysiology evaluation.   He has a history significant for chronic systolic heart failure, coronary artery disease post RCA stent, left bundle branch block, obstructive sleep apnea on CPAP.  He is post Medtronic CRT-D implanted 03/27/2017.  Implant was complicated by microperforation.  He received colchicine with improvement in symptoms.  He has had atrial fibrillation and is now on Eliquis.  He has had normalization of his LV function.  Today, denies symptoms of palpitations, chest pain, shortness of breath, orthopnea, PND, lower extremity edema, claudication, dizziness, presyncope, syncope, bleeding, or neurologic sequela. The patient is tolerating medications without difficulties.  He is feeling well.  He has no chest pain or shortness of breath.  Able to do all of his daily activities without restriction.   Past Medical History:  Diagnosis Date   AICD (automatic cardioverter/defibrillator) present    Allergic rhinitis    Allergy    Anginal pain ([place]) 05/26/14   chest pain after chasing dog   Atypical nevus of back 04/27/2003   moderate - mid lower back   Basal cell carcinoma 01/26/2008   right cheek - MOHs   CAD (coronary artery disease)    hx of stent- 2005 RCA   Cataract    beginning stage both eyes   CHF (congestive heart failure) (HCC)    pacemaker Medtronic     Chronic back pain    intermittent   Constipation    Dizziness    Dysrhythmia    right bundle branch block    Esophageal stricture    GERD (gastroesophageal reflux disease)    Hemoptysis     Hiatal hernia    History of colonic polyps    hyperplastic   HTN (hypertension)    Hyperlipidemia    Hypertrophy of prostate with urinary obstruction and other lower urinary tract symptoms (LUTS)    OA (osteoarthritis)    OSA (obstructive sleep apnea)    cpap- 10    Other specified disorder of stomach and duodenum    duodenal periampulary tubulovillous adenoma removed by Dr. Ardis Hughs 5/10   Pericarditis 07/06/2019   Pneumonia    Pre-diabetes    no medications   Shortness of breath    with exertion   Sleep apnea    cpap   Testicular hypofunction    Past Surgical History:  Procedure Laterality Date   BILIARY STENT PLACEMENT N/A 03/19/2021   Procedure: BILIARY STENT PLACEMENT;  Surgeon: Irving Copas., MD;  Location: [facility];  Service: Gastroenterology;  Laterality: N/A;   BIV ICD INSERTION CRT-D N/A 03/27/2017   Procedure: BIV ICD INSERTION CRT-D;  Surgeon: Constance Haw, MD;  Location: [facility];  Service: Cardiovascular;  Laterality: N/A;   CARDIAC CATHETERIZATION     '05, last 2009, showing patent RCA stent   COLONOSCOPY  12/2007   HYPERPLASTIC POLYP   COLONOSCOPY  12/2019   COLONOSCOPY WITH PROPOFOL N/A 06/02/2014   Procedure: COLONOSCOPY WITH PROPOFOL;  Surgeon: [name], MD;  [facility]: [facility];  Service: Endoscopy;  Laterality: N/A;   CORONARY ANGIOPLASTY  08/2003   ENDOSCOPIC MUCOSAL RESECTION  03/19/2021   Procedure: ENDOSCOPIC MUCOSAL RESECTION, ampullectomy;  Surgeon: Irving Copas., MD;  [facility]: [facility];  Service: Gastroenterology;;   ENDOSCOPIC RETROGRADE CHOLANGIOPANCREATOGRAPHY (ERCP) WITH PROPOFOL N/A 03/19/2021   Procedure: ENDOSCOPIC RETROGRADE CHOLANGIOPANCREATOGRAPHY (ERCP) WITH PROPOFOL;  Surgeon: Irving Copas., MD;  Location: [facility];  Service: Gastroenterology;  Laterality: N/A;   ENDOSCOPIC RETROGRADE CHOLANGIOPANCREATOGRAPHY (ERCP) WITH PROPOFOL N/A 11/29/2021   Procedure: ENDOSCOPIC  RETROGRADE CHOLANGIOPANCREATOGRAPHY (ERCP) WITH PROPOFOL;  Surgeon: Rush Landmark Telford Nab., MD;  Location: [place] [facility];  Service: Gastroenterology;  Laterality: N/A;   ESOPHAGOGASTRODUODENOSCOPY (EGD) WITH PROPOFOL N/A 06/02/2014   Procedure: ESOPHAGOGASTRODUODENOSCOPY (EGD) WITH PROPOFOL;  Surgeon: [name], MD;  [facility];  Service: Endoscopy;  Laterality: N/A;   ESOPHAGOGASTRODUODENOSCOPY (EGD) WITH PROPOFOL N/A 03/19/2021   Procedure: ESOPHAGOGASTRODUODENOSCOPY (EGD) WITH PROPOFOL;  Surgeon: Rush Landmark Telford Nab., MD;  [facility];  Service: Gastroenterology;  Laterality: N/A;   ESOPHAGOGASTRODUODENOSCOPY (EGD) WITH PROPOFOL N/A 03/23/2021   Procedure: ESOPHAGOGASTRODUODENOSCOPY (EGD) WITH PROPOFOL;  Surgeon: Ladene Artist, MD;  [facility];  Service: Endoscopy;  Laterality: N/A;   hernia surgery x 3     Bilateral Inguinal, Umbicial   INSERTION OF MESH N/A 03/20/2018   Procedure: INSERTION OF MESH;  Surgeon: Alphonsa Overall, MD;  Location: [facility];  Service: General;  Laterality: N/A;   IRRIGATION AND DEBRIDEMENT ABSCESS Left 08/14/2012   Procedure: IRRIGATION AND DEBRIDEMENT LEFT INGUINAL BOIL ;  Surgeon: Ailene Rud, MD;  [facility];  Service: Urology;  Laterality: Left;   JOINT REPLACEMENT Right 2017   KNEE ARTHROPLASTY     LEFT HEART CATH AND CORONARY ANGIOGRAPHY N/A 01/24/2019   Procedure: LEFT HEART CATH AND CORONARY ANGIOGRAPHY;  Surgeon: Martinique, Peter M, MD;  Location: [facility];  Service: Cardiovascular;  Laterality: N/A;   LEFT HEART CATHETERIZATION WITH CORONARY ANGIOGRAM N/A 05/26/2014   Procedure: LEFT HEART CATHETERIZATION WITH CORONARY ANGIOGRAM;  Surgeon: Blane Ohara, MD;  Location: [facility];  Service: Cardiovascular;  Laterality: N/A;   LUMBAR LAMINECTOMY/DECOMPRESSION MICRODISCECTOMY Left 09/26/2014   Procedure: Lumbar Laminectomy for resection of synovial cyst  Lumbar five- sacral one left;  Surgeon: Kary Kos, MD;  Location: [facility];  Service: Neurosurgery;  Laterality: Left;   MOLE REMOVAL Left 03/20/2018   Procedure: MOLE REMOVAL;  Surgeon: Alphonsa Overall, MD;  Location: [facility];  Service: General;  Laterality: Left;   Oral surg to removed growth from ?sinus  10/2010   ?Dermoid removed by [facility]   PACEMAKER INSERTION  04/03/2017   PANCREATIC STENT PLACEMENT  03/19/2021   Procedure: PANCREATIC STENT PLACEMENT;  Surgeon: Irving Copas., MD;  [facility];  Service: Gastroenterology;;   POLYPECTOMY     RCA stenting     '05 RCA   REMOVAL OF STONES  11/29/2021   Procedure: REMOVAL OF SLUDGE;  Surgeon: Rush Landmark Telford Nab., MD;  Location: [facility];  Service: Gastroenterology;;   right toe surgery  Right    Cyst    RIGHT/LEFT HEART CATH AND CORONARY ANGIOGRAPHY N/A 01/15/2017   Procedure: RIGHT/LEFT HEART CATH AND CORONARY ANGIOGRAPHY;  Surgeon: [name], MD;  Location: [facility];  Service: Cardiovascular;  Laterality: N/A;   s/p right knee arthroscopy  2005   SPHINCTEROTOMY  03/19/2021   Procedure: SPHINCTEROTOMY;  Surgeon: Mansouraty, Telford Nab., MD;  Location: [facility];  Service: Gastroenterology;;   Lavell Islam REMOVAL  11/29/2021  Procedure: STENT REMOVAL;  Surgeon: Irving Copas., MD;  Location: [facility];  Service: Gastroenterology;;   SUBMUCOSAL LIFTING INJECTION  03/19/2021   Procedure: SUBMUCOSAL LIFTING INJECTION;  Surgeon: Irving Copas., MD;  Location: [facility];  Service: Gastroenterology;;   thumb surgery Right    TOTAL KNEE ARTHROPLASTY Right 03/12/2016   Procedure: RIGHT TOTAL KNEE ARTHROPLASTY;  Surgeon: Paralee Cancel, MD;  [facility];  Service: Orthopedics;  Laterality: Right;   TOTAL KNEE ARTHROPLASTY Left 12/10/2021   Procedure: TOTAL KNEE ARTHROPLASTY;  Surgeon: Gaynelle Arabian, MD;  [facility]: [facility];  Service: Orthopedics;  Laterality: Left;   UPPER ESOPHAGEAL ENDOSCOPIC ULTRASOUND (EUS) N/A 03/19/2021    Procedure: UPPER ESOPHAGEAL ENDOSCOPIC ULTRASOUND (EUS);  Surgeon: Irving Copas., MD;  Location: [facility];  Service: Gastroenterology;  Laterality: N/A;   UPPER GASTROINTESTINAL ENDOSCOPY     VENTRAL HERNIA REPAIR N/A 03/20/2018   Procedure: LAPAROSCOPIC VENTRAL INCISIONAL  HERNIA ERAS PATHWAY;  Surgeon: Alphonsa Overall, MD;  Location: [facility];  Service: General;  Laterality: N/A;     Current Outpatient Medications  Medication Sig Dispense Refill   acetaminophen (TYLENOL) 650 MG CR tablet Take 650 mg by mouth in the morning and at bedtime.     atorvastatin (LIPITOR) 20 MG tablet TAKE 1 TABLET BY MOUTH DAILY 90 tablet 0   b complex vitamins tablet Take 1 tablet by mouth daily with lunch.     betamethasone dipropionate 0.05 % cream SMARTSIG:1 sparingly Topical Twice Daily     carvedilol (COREG) 25 MG tablet TAKE 1 TABLET BY MOUTH TWICE DAILY 180 tablet 3   cholecalciferol (VITAMIN D3) 25 MCG (1000 UNIT) tablet Take 1,000 Units by mouth daily.     Coenzyme Q10 (CO Q 10 PO) Take 200 mg by mouth daily with lunch.     ELIQUIS 5 MG TABS tablet TAKE 1 TABLET BY MOUTH TWICE DAILY 180 tablet 1   Flaxseed, Linseed, (FLAXSEED OIL PO) Take 1 capsule by mouth daily. With Omega 3     fluocinonide (LIDEX) 0.05 % external solution APPLY 1 ML TOPICALLY TO THE SCALP TWICE DAILY     folic acid (FOLVITE) A999333 MCG tablet Take 400 mcg by mouth daily.     Glucosamine HCl (GLUCOSAMINE PO) Take 1,500 mg by mouth every evening.     hydrocortisone 2.5 % cream Apply 1 application topically 2 (two) times daily as needed (rash on nose).      hydroxychloroquine (PLAQUENIL) 200 MG tablet TAKE 1 TABLET BY MOUTH EVERY DAY ON MONDAY-FRIDAY ONLY, DO NOT TAKE ON SATURDAY OR SUNDAY 60 tablet 0   Lutein 20 MG TABS Take 20 mg by mouth daily.     Multiple Vitamin (MULTIVITAMIN) capsule Take 1 capsule by mouth daily.       nitroGLYCERIN (NITROSTAT) 0.4 MG SL tablet Place 1 tablet (0.4 mg total) under the tongue every 5  (five) minutes x 3 doses as needed for chest pain. 25 tablet 0   NON FORMULARY CPAP machine with sleep.     pantoprazole (PROTONIX) 40 MG tablet Take 1 tablet (40 mg total) by mouth daily. before breakfast 90 tablet 3   polyethylene glycol (MIRALAX / GLYCOLAX) 17 g packet Take 17 g by mouth daily as needed for mild constipation. 14 each 0   Probiotic Product (PROBIOTIC DAILY PO) Take 1 capsule by mouth daily with lunch.      RESTASIS 0.05 % ophthalmic emulsion Place 1 drop into both eyes 2 (two) times daily as needed (dry  eyes).     sacubitril-valsartan (ENTRESTO) 49-51 MG Take 1 tablet by mouth 2 (two) times daily. 180 tablet 3   Saw Palmetto, Serenoa repens, (SAW PALMETTO PO) Take 450 mg by mouth every evening.     spironolactone (ALDACTONE) 25 MG tablet TAKE 1/2 TABLET(12.5 MG) BY MOUTH DAILY 45 tablet 3   tamsulosin (FLOMAX) 0.4 MG CAPS capsule Take 0.4 mg by mouth daily.     Testosterone 20 % CREA Apply 2 mLs topically daily. Rub on shoulder     tobramycin-dexamethasone (TOBRADEX) ophthalmic ointment APPLY A SMALL AMOUNT ONTO ITCHING EYELID AREA 3X/DAY FOR 1 WEEK THEN DAILY FOR 1 WEEK THEN EVERY OTHER DAY FOR 2 WEEKS.     traMADol (ULTRAM) 50 MG tablet Take 1 tablet (50 mg total) by mouth at bedtime as needed. 30 tablet 0   TURMERIC PO Take 1 tablet by mouth daily with lunch.      vitamin C (ASCORBIC ACID) 500 MG tablet Take 500 mg by mouth daily.     zinc gluconate 50 MG tablet Take 50 mg by mouth daily.     No current facility-administered medications for this visit.    Allergies:   Sulfonamide derivatives   Social History:  The patient  reports that he quit smoking about 32 years ago. His smoking use included cigarettes. He has never been exposed to tobacco smoke. He has never used smokeless tobacco. He reports that he does not currently use alcohol. He reports that he does not use drugs.   Family History:  The patient's family history includes Arthritis in his brother and daughter;  COPD in his brother; Cancer in his brother; Coronary artery disease in his father; Diabetes in his father; Heart disease in his father and mother; Heart failure in his brother; Hypertension in an other family member; Stomach cancer in his paternal grandmother; Sudden death in his father.   ROS:  Please see the history of present illness.   Otherwise, review of systems is positive for none.   All other systems are reviewed and negative.   PHYSICAL EXAM: VS:  BP 120/66   Pulse 72   Ht '5\' 7"'$  (1.702 m)   Wt 189 lb (85.7 kg)   SpO2 97%   BMI 29.60 kg/m  , BMI Body mass index is 29.6 kg/m. GEN: Well nourished, well developed, in no acute distress  HEENT: normal  Neck: no JVD, carotid bruits, or masses Cardiac: RRR; no murmurs, rubs, or gallops,no edema  Respiratory:  clear to auscultation bilaterally, normal work of breathing GI: soft, nontender, nondistended, + BS MS: no deformity or atrophy  Skin: warm and dry, device site well healed Neuro:  Strength and sensation are intact Psych: euthymic mood, full affect  EKG:  EKG is ordered today. Personal review of the ekg ordered shows A sense, V pace  Personal review of the device interrogation today. Results in [place]: 05/29/2022: ALT 17; BUN 25; Creatinine, Ser 1.01; Hemoglobin 15.3; Platelets 277.0; Potassium 4.4; Sodium 138; TSH 2.26    Lipid Panel     Component Value Date/Time   CHOL 121 05/29/2022 1610   CHOL 138 11/27/2016 0914   TRIG 175.0 (H) 05/29/2022 1610   HDL 35.70 (L) 05/29/2022 1610   HDL 35 (L) 11/27/2016 0914   CHOLHDL 3 05/29/2022 1610   VLDL 35.0 05/29/2022 1610   LDLCALC 51 05/29/2022 1610   LDLCALC 65 11/27/2016 0914   LDLDIRECT 75.0 09/27/2021 0815     Wt Readings  from Last 3 Encounters:  07/24/22 189 lb (85.7 kg)  07/23/22 188 lb 9.6 oz (85.5 kg)  07/01/22 191 lb (86.6 kg)      Other studies Reviewed: Additional studies/ records that were reviewed today include: TTE 06/25/16 Review of  the above records today demonstrates:  - Left ventricle: The cavity size was normal. Systolic function was   normal. The estimated ejection fraction was in the range of 55%   to 60%. Wall motion was normal; there were no regional wall   motion abnormalities. Doppler parameters are consistent with   abnormal left ventricular relaxation (grade 1 diastolic   dysfunction). - Ventricular septum: Septal motion showed dyssynergy. These   changes are consistent with a left bundle branch block. - Aortic valve: There was trivial regurgitation. - Atrial septum: There was increased thickness of the septum,   consistent with lipomatous hypertrophy.  LHC/RHC 01/05/17 1. Widely patent coronary arteries with continued patency of the stented segment in the right coronary artery 2. Mild nonobstructive coronary artery disease as detailed with mild stenosis of the proximal to mid LAD and otherwise minimal luminal irregularities 3. Well compensated right-sided cardiac hemodynamics  ASSESSMENT AND PLAN:  1.  Chronic systolic heart failure: Status post Medtronic CRT-D implanted 03/27/2017.  Has had normalization of LV function.  Device function appropriate.  Threshold, sensing, impedance within normal limits.  No changes.  2.  Paroxysmal atrial fibrillation: Currently on Eliquis.  CHA2DS2-VASc of 4.  Normal episodes.  No changes.  3.  Hyperlipidemia: Continue statin  4.  Hypertension: Currently well-controlled  5.  Coronary artery disease: No current chest pain  6.  Secondary hypercoagulable state: Currently on Eliquis for atrial fibrillation   Current medicines are reviewed at length with the patient today.   The patient does not have concerns regarding his medicines.  The following changes were made today: None  Labs/ tests ordered today include:  Orders Placed This Encounter  Procedures   EKG 12-Lead     Disposition:   FU 12 months  Signed, Tarren Velardi [name], MD  07/24/2022 9:18 AM      [facility] [address] [place] [address] [phone] (office) [phone] (fax)

## 2022-07-23 NOTE — Patient Instructions (Signed)
Standing Labs We placed an order today for your standing lab work.   Please have your standing labs drawn in June  Please have your labs drawn 2 weeks prior to your appointment so that the provider can discuss your lab results at your appointment, if possible.  Please note that you may see your imaging and lab results in [place] before we have reviewed them. We will contact you once all results are reviewed. Please allow our office up to 72 hours to thoroughly review all of the results before contacting the office for clarification of your results.  WALK-IN LAB HOURS  Monday through Thursday from 8:00 am -12:30 pm and 1:00 pm-5:00 pm and Friday from 8:00 am-12:00 pm.  Patients with office visits requiring labs will be seen before walk-in labs.  You may encounter longer than normal wait times. Please allow additional time. Wait times may be shorter on  Monday and Thursday afternoons.  We do not book appointments for walk-in labs. We appreciate your patience and understanding with our staff.   Labs are drawn by [facility]. Please bring your co-pay at the time of your lab draw.  You may receive a bill from [facility] for your lab work.  Please note if you are on Hydroxychloroquine and and an order has been placed for a Hydroxychloroquine level,  you will need to have it drawn 4 hours or more after your last dose.  If you wish to have your labs drawn at another location, please call the office 24 hours in advance so we can fax the orders.  The office is located at [address], [place], [place], [place] [address]   If you have any questions regarding directions or hours of operation,  please call [phone].   As a reminder, please drink plenty of water prior to coming for your lab work. Thanks!   Vaccines You are taking a medication(s) that can suppress your immune system.  The following immunizations are recommended: Flu annually Covid-19  Td/Tdap (tetanus, diphtheria, pertussis) every  10 years Pneumonia (Prevnar 15 then Pneumovax 23 at least 1 year apart.  Alternatively, can take Prevnar 20 without needing additional dose) Shingrix: 2 doses from 4 weeks to 6 months apart  Please check with your PCP to make sure you are up to date.

## 2022-07-24 ENCOUNTER — Ambulatory Visit: Payer: Medicare Other | Attending: Cardiology | Admitting: Cardiology

## 2022-07-24 ENCOUNTER — Encounter: Payer: Self-pay | Admitting: Cardiology

## 2022-07-24 ENCOUNTER — Encounter: Payer: Self-pay | Admitting: Cardiovascular Disease

## 2022-07-24 VITALS — BP 120/66 | HR 72 | Ht 67.0 in | Wt 189.0 lb

## 2022-07-24 DIAGNOSIS — D6869 Other thrombophilia: Secondary | ICD-10-CM | POA: Diagnosis not present

## 2022-07-24 DIAGNOSIS — I5022 Chronic systolic (congestive) heart failure: Secondary | ICD-10-CM | POA: Diagnosis not present

## 2022-07-24 DIAGNOSIS — I2510 Atherosclerotic heart disease of native coronary artery without angina pectoris: Secondary | ICD-10-CM | POA: Diagnosis not present

## 2022-07-24 DIAGNOSIS — I480 Paroxysmal atrial fibrillation: Secondary | ICD-10-CM | POA: Insufficient documentation

## 2022-07-24 NOTE — Patient Instructions (Signed)
Medication Instructions:  Your physician recommends that you continue on your current medications as directed. Please refer to the Current Medication list given to you today.  *If you need a refill on your cardiac medications before your next appointment, please call your pharmacy*   Lab Work: None ordered   Testing/Procedures: None ordered   Follow-Up: At [facility], you and your health needs are our priority.  As part of our continuing mission to provide you with exceptional heart care, we have created designated Provider Care Teams.  These Care Teams include your primary Cardiologist (physician) and Advanced Practice Providers (APPs -  Physician Assistants and Nurse Practitioners) who all work together to provide you with the care you need, when you need it.  Remote monitoring is used to monitor your Pacemaker or ICD from home. This monitoring reduces the number of office visits required to check your device to one time per year. It allows [place] to keep an eye on the functioning of your device to ensure it is working properly. You are scheduled for a device check from home on 08/01/22. You may send your transmission at any time that day. If you have a wireless device, the transmission will be sent automatically. After your physician reviews your transmission, you will receive a postcard with your next transmission date.  Your next appointment:   1 year(s)  The format for your next appointment:   In Person  Provider:   Tommye Standard, PA-C    Thank you for choosing [facility]!!   Trinidad Curet, RN [phone]

## 2022-07-25 LAB — DRUG MONITOR, PANEL 5, W/CONF, URINE
Amphetamines: NEGATIVE ng/mL (ref <500)
Barbiturates: NEGATIVE ng/mL (ref <300)
Benzodiazepines: NEGATIVE ng/mL (ref <100)
Cocaine Metabolite: NEGATIVE ng/mL (ref <150)
Creatinine: 55.9 mg/dL (ref >=20.0)
Marijuana Metabolite: NEGATIVE ng/mL (ref <20)
Methadone Metabolite: NEGATIVE ng/mL (ref <100)
Opiates: NEGATIVE ng/mL (ref <100)
Oxidant: NEGATIVE ug/mL (ref <200)
Oxycodone: NEGATIVE ng/mL (ref <100)
pH: 6.4 (ref 4.5–9.0)

## 2022-07-25 LAB — DRUG MONITOR, TRAMADOL,QN, URINE
Desmethyltramadol: 1209 ng/mL — ABNORMAL HIGH (ref <100)
Tramadol: 1428 ng/mL — ABNORMAL HIGH (ref <100)

## 2022-07-25 LAB — DM TEMPLATE

## 2022-07-25 NOTE — Progress Notes (Signed)
UDS is consistent with tramadol use.

## 2022-07-30 ENCOUNTER — Other Ambulatory Visit: Payer: Self-pay | Admitting: Cardiovascular Disease

## 2022-08-01 ENCOUNTER — Ambulatory Visit (INDEPENDENT_AMBULATORY_CARE_PROVIDER_SITE_OTHER): Payer: Medicare Other

## 2022-08-01 DIAGNOSIS — I428 Other cardiomyopathies: Secondary | ICD-10-CM | POA: Diagnosis not present

## 2022-08-02 LAB — CUP PACEART REMOTE DEVICE CHECK
Battery Remaining Longevity: 17 mo
Battery Voltage: 2.91 V
Brady Statistic AP VP Percent: 0.95 %
Brady Statistic AP VS Percent: 0.02 %
Brady Statistic AS VP Percent: 96.85 %
Brady Statistic AS VS Percent: 2.18 %
Brady Statistic RA Percent Paced: 0.97 %
Brady Statistic RV Percent Paced: 29.71 %
Date Time Interrogation Session: 20240314022724
HighPow Impedance: 70 Ohm
Implantable Lead Connection Status: 753985
Implantable Lead Connection Status: 753985
Implantable Lead Connection Status: 753985
Implantable Lead Implant Date: 20181108
Implantable Lead Implant Date: 20181108
Implantable Lead Implant Date: 20181108
Implantable Lead Location: 753858
Implantable Lead Location: 753859
Implantable Lead Location: 753860
Implantable Lead Model: 4298
Implantable Lead Model: 5076
Implantable Pulse Generator Implant Date: 20181108
Lead Channel Impedance Value: 1178 Ohm
Lead Channel Impedance Value: 1178 Ohm
Lead Channel Impedance Value: 1292 Ohm
Lead Channel Impedance Value: 212.8 Ohm
Lead Channel Impedance Value: 231.42 Ohm
Lead Channel Impedance Value: 273.94 Ohm
Lead Channel Impedance Value: 299.657
Lead Channel Impedance Value: 337.947
Lead Channel Impedance Value: 399 Ohm
Lead Channel Impedance Value: 418 Ohm
Lead Channel Impedance Value: 456 Ohm
Lead Channel Impedance Value: 551 Ohm
Lead Channel Impedance Value: 608 Ohm
Lead Channel Impedance Value: 665 Ohm
Lead Channel Impedance Value: 665 Ohm
Lead Channel Impedance Value: 703 Ohm
Lead Channel Impedance Value: 836 Ohm
Lead Channel Impedance Value: 874 Ohm
Lead Channel Pacing Threshold Amplitude: 0.375 V
Lead Channel Pacing Threshold Amplitude: 0.5 V
Lead Channel Pacing Threshold Amplitude: 2.75 V
Lead Channel Pacing Threshold Pulse Width: 0.4 ms
Lead Channel Pacing Threshold Pulse Width: 0.4 ms
Lead Channel Pacing Threshold Pulse Width: 1 ms
Lead Channel Sensing Intrinsic Amplitude: 12.75 mV
Lead Channel Sensing Intrinsic Amplitude: 12.75 mV
Lead Channel Sensing Intrinsic Amplitude: 2.75 mV
Lead Channel Sensing Intrinsic Amplitude: 2.75 mV
Lead Channel Setting Pacing Amplitude: 2 V
Lead Channel Setting Pacing Amplitude: 2.5 V
Lead Channel Setting Pacing Amplitude: 3 V
Lead Channel Setting Pacing Pulse Width: 0.4 ms
Lead Channel Setting Pacing Pulse Width: 1 ms
Lead Channel Setting Sensing Sensitivity: 0.3 mV
Zone Setting Status: 755011
Zone Setting Status: 755011

## 2022-08-14 DIAGNOSIS — L821 Other seborrheic keratosis: Secondary | ICD-10-CM | POA: Diagnosis not present

## 2022-08-14 DIAGNOSIS — L300 Nummular dermatitis: Secondary | ICD-10-CM | POA: Diagnosis not present

## 2022-08-14 DIAGNOSIS — L812 Freckles: Secondary | ICD-10-CM | POA: Diagnosis not present

## 2022-08-14 DIAGNOSIS — L218 Other seborrheic dermatitis: Secondary | ICD-10-CM | POA: Diagnosis not present

## 2022-08-14 DIAGNOSIS — L738 Other specified follicular disorders: Secondary | ICD-10-CM | POA: Diagnosis not present

## 2022-08-15 ENCOUNTER — Telehealth: Payer: Self-pay

## 2022-08-15 NOTE — Telephone Encounter (Signed)
Called patient to schedule Medicare Annual Wellness Visit (AWV). No voicemail available to leave a message.  Last date of AWV: eligible 02/17/10 for AWV-I  Please schedule an appointment at any time with [facility].   Norton Blizzard, CMA (AAMA)  [facility]

## 2022-08-19 ENCOUNTER — Ambulatory Visit: Payer: Medicare Other | Attending: Cardiology

## 2022-08-19 DIAGNOSIS — I5022 Chronic systolic (congestive) heart failure: Secondary | ICD-10-CM

## 2022-08-19 DIAGNOSIS — Z95810 Presence of automatic (implantable) cardiac defibrillator: Secondary | ICD-10-CM | POA: Diagnosis not present

## 2022-08-21 ENCOUNTER — Telehealth: Payer: Self-pay

## 2022-08-21 ENCOUNTER — Other Ambulatory Visit: Payer: Self-pay | Admitting: *Deleted

## 2022-08-21 DIAGNOSIS — I480 Paroxysmal atrial fibrillation: Secondary | ICD-10-CM

## 2022-08-21 MED ORDER — APIXABAN 5 MG PO TABS: 5.0000 mg | ORAL_TABLET | Freq: Two times a day (BID) | ORAL | 1 refills | Status: DC

## 2022-08-21 MED ORDER — TRAMADOL HCL 50 MG PO TABS: 50.0000 mg | ORAL_TABLET | Freq: Every evening | ORAL | 0 refills | Status: DC | PRN

## 2022-08-21 NOTE — Telephone Encounter (Signed)
Patient called the office and requested refill on tramadol.   Last Fill: 07/23/2022  UDS: 07/23/2022 UDS is consistent with tramadol use   Narc Agreement: 07/23/2022  Next Visit: 01/28/2023  Last Visit: 07/23/2022  Dx: Seropositive rheumatoid arthritis   Current Dose per office note on 07/23/2022:tramadol 50 mg p.o. p.o. nightly as needed pain    Okay to refill Tramadol?

## 2022-08-21 NOTE — Telephone Encounter (Signed)
Eliquis 5mg  refill request received. Patient is 77 years old, weight-85.7kg, Crea-1.01 on 05/29/22, Diagnosis-Afib, and last seen by Dr. Curt Bears on 07/24/22. Dose is appropriate based on dosing criteria. Will send in refill to requested pharmacy.

## 2022-08-21 NOTE — Telephone Encounter (Signed)
Remote ICM transmission received.  Attempted call to patient regarding ICM remote transmission and left detailed message per DPR.  Advised to return call for any fluid symptoms or questions. Next ICM remote transmission scheduled 09/23/2022.

## 2022-08-21 NOTE — Progress Notes (Signed)
EPIC Encounter for ICM Monitoring  Patient Name: [name] is a 77 y.o. male Date: 08/21/2022 Primary Care Physican: Biagio Borg, MD Primary Cardiologist: Burt Knack Electrophysiologist: Curt Bears Bi-V Pacing:   95.7%  Effective 95.3%      07/24/2022 Office Weight: 189 lbs   Time in AT/AF  0.0 hr/day (0.0%)                                                            Attempted call to patient and unable to reach.  Left detailed message per DPR regarding transmission. Transmission reviewed.  [place] from [place].   Optivol thoracic impedance suggesting normal fluid levels.   Prescribed: Spironolactone 25 mg take 0.5 tablet daily.   Labs: 02/19/2022 Creatinine 0.88, BUN 13, Potassium 4.4, Sodium 140, GFR 89 12/11/2021 Creatinine 0.89, BUN 19, Potassium 4.2, Sodium 138, GFR >60  11/26/2021 Creatinine 0.92, BUN 15, Potassium 3.9, Sodium 141, GFR >60  09/27/2021 Creatinine 0.88, BUN 16, Potassium 3.7, Sodium 140, GFR 82.91 09/18/2021 Creatinine 0.86, BUN 16, Potassium 4.3, Sodium 141   Recommendations:  Left voice mail with ICM number and encouraged to call if experiencing any fluid symptoms.   Follow-up plan: ICM clinic phone appointment on 09/23/2022.   91 day device clinic remote transmission 10/31/2022.       EP/Cardiology Office Visits:   12/09/2022 with Dr Burt Knack.     Copy of ICM check sent to Dr. Curt Bears.    3 month ICM trend: 08/19/2022.    12-14 Month ICM trend:     [name], RN 08/21/2022 4:02 PM

## 2022-09-03 NOTE — Progress Notes (Signed)
Remote ICD transmission.   

## 2022-09-18 DIAGNOSIS — U071 COVID-19: Secondary | ICD-10-CM | POA: Insufficient documentation

## 2022-09-23 ENCOUNTER — Encounter: Payer: Self-pay | Admitting: Family Medicine

## 2022-09-23 ENCOUNTER — Telehealth (INDEPENDENT_AMBULATORY_CARE_PROVIDER_SITE_OTHER): Payer: Medicare Other | Admitting: Family Medicine

## 2022-09-23 ENCOUNTER — Telehealth: Payer: Self-pay | Admitting: Internal Medicine

## 2022-09-23 ENCOUNTER — Ambulatory Visit: Payer: Medicare Other | Attending: Cardiology

## 2022-09-23 DIAGNOSIS — U071 COVID-19: Secondary | ICD-10-CM

## 2022-09-23 DIAGNOSIS — I5022 Chronic systolic (congestive) heart failure: Secondary | ICD-10-CM

## 2022-09-23 DIAGNOSIS — Z95810 Presence of automatic (implantable) cardiac defibrillator: Secondary | ICD-10-CM | POA: Diagnosis not present

## 2022-09-23 MED ORDER — DEXAMETHASONE 6 MG PO TABS
6.0000 mg | ORAL_TABLET | Freq: Every day | ORAL | 0 refills | Status: AC
Start: 2022-09-23 — End: 2022-09-28

## 2022-09-23 MED ORDER — NIRMATRELVIR/RITONAVIR (PAXLOVID)TABLET: 3.0000 | ORAL_TABLET | Freq: Two times a day (BID) | ORAL | 0 refills | Status: AC

## 2022-09-23 MED ORDER — ALBUTEROL SULFATE HFA 108 (90 BASE) MCG/ACT IN AERS
2.0000 | INHALATION_SPRAY | Freq: Four times a day (QID) | RESPIRATORY_TRACT | 0 refills | Status: DC | PRN
Start: 2022-09-23 — End: 2023-06-02

## 2022-09-23 NOTE — Progress Notes (Signed)
Virtual Visit via Video Note  I connected with [name] on 09/23/22 at 10:40 AM EDT by a video enabled telemedicine application and verified that I am speaking with the correct person using two identifiers.  Patient Location: Home Provider Location: Office/Clinic  I discussed the limitations, risks, security, and privacy concerns of performing an evaluation and management service by video and the availability of in person appointments. I also discussed with the patient that there may be a patient responsible charge related to this service. The patient expressed understanding and agreed to proceed.  Subjective: PCP: Corwin Levins, MD  Chief Complaint  Patient presents with   Covid Positive    Cough, runny nose, fever 100.4, home POS covid (was expired)  Started on Friday     Patient complains of cough, head/chest congestion, runny nose, sore throat, fever, shortness of breath, and wheezing.  Max temp 100.4. He denies headache, nausea, vomiting, and diarrhea. Onset of symptoms was 3 days ago, gradually worsening since that time. He is drinking moderate amounts of fluids. Evaluation to date: home COVID test positive. Treatment to date: antihistamines. He does not smoke.    ROS: Per HPI  Current Outpatient Medications:    acetaminophen (TYLENOL) 650 MG CR tablet, Take 650 mg by mouth in the morning and at bedtime., Disp: , Rfl:    apixaban (ELIQUIS) 5 MG TABS tablet, Take 1 tablet (5 mg total) by mouth 2 (two) times daily., Disp: 180 tablet, Rfl: 1   atorvastatin (LIPITOR) 20 MG tablet, TAKE 1 TABLET BY MOUTH DAILY, Disp: 90 tablet, Rfl: 2   b complex vitamins tablet, Take 1 tablet by mouth daily with lunch., Disp: , Rfl:    betamethasone dipropionate 0.05 % cream, SMARTSIG:1 sparingly Topical Twice Daily, Disp: , Rfl:    carvedilol (COREG) 25 MG tablet, TAKE 1 TABLET BY MOUTH TWICE DAILY, Disp: 180 tablet, Rfl: 3   cholecalciferol (VITAMIN D3) 25 MCG (1000 UNIT) tablet, Take  1,000 Units by mouth daily., Disp: , Rfl:    Coenzyme Q10 (CO Q 10 PO), Take 200 mg by mouth daily with lunch., Disp: , Rfl:    Flaxseed, Linseed, (FLAXSEED OIL PO), Take 1 capsule by mouth daily. With Omega 3, Disp: , Rfl:    fluocinonide (LIDEX) 0.05 % external solution, APPLY 1 ML TOPICALLY TO THE SCALP TWICE DAILY, Disp: , Rfl:    folic acid (FOLVITE) 400 MCG tablet, Take 400 mcg by mouth daily., Disp: , Rfl:    Glucosamine HCl (GLUCOSAMINE PO), Take 1,500 mg by mouth every evening., Disp: , Rfl:    hydrocortisone 2.5 % cream, Apply 1 application topically 2 (two) times daily as needed (rash on nose). , Disp: , Rfl:    hydroxychloroquine (PLAQUENIL) 200 MG tablet, TAKE 1 TABLET BY MOUTH EVERY DAY ON MONDAY-FRIDAY ONLY, DO NOT TAKE ON SATURDAY OR SUNDAY, Disp: 60 tablet, Rfl: 0   Lutein 20 MG TABS, Take 20 mg by mouth daily., Disp: , Rfl:    Multiple Vitamin (MULTIVITAMIN) capsule, Take 1 capsule by mouth daily.  , Disp: , Rfl:    NON FORMULARY, CPAP machine with sleep., Disp: , Rfl:    pantoprazole (PROTONIX) 40 MG tablet, Take 1 tablet (40 mg total) by mouth daily. before breakfast, Disp: 90 tablet, Rfl: 3   polyethylene glycol (MIRALAX / GLYCOLAX) 17 g packet, Take 17 g by mouth daily as needed for mild constipation., Disp: 14 each, Rfl: 0   Probiotic Product (PROBIOTIC DAILY PO), Take  1 capsule by mouth daily with lunch. , Disp: , Rfl:    RESTASIS 0.05 % ophthalmic emulsion, Place 1 drop into both eyes 2 (two) times daily as needed (dry eyes)., Disp: , Rfl:    sacubitril-valsartan (ENTRESTO) 49-51 MG, Take 1 tablet by mouth 2 (two) times daily., Disp: 180 tablet, Rfl: 3   Saw Palmetto, Serenoa repens, (SAW PALMETTO PO), Take 450 mg by mouth every evening., Disp: , Rfl:    spironolactone (ALDACTONE) 25 MG tablet, TAKE 1/2 TABLET(12.5 MG) BY MOUTH DAILY, Disp: 45 tablet, Rfl: 2   tamsulosin (FLOMAX) 0.4 MG CAPS capsule, Take 0.4 mg by mouth daily., Disp: , Rfl:    Testosterone 20 % CREA,  Apply 2 mLs topically daily. Rub on shoulder, Disp: , Rfl:    tobramycin-dexamethasone (TOBRADEX) ophthalmic ointment, APPLY A SMALL AMOUNT ONTO ITCHING EYELID AREA 3X/DAY FOR 1 WEEK THEN DAILY FOR 1 WEEK THEN EVERY OTHER DAY FOR 2 WEEKS., Disp: , Rfl:    traMADol (ULTRAM) 50 MG tablet, Take 1 tablet (50 mg total) by mouth at bedtime as needed., Disp: 30 tablet, Rfl: 0   TURMERIC PO, Take 1 tablet by mouth daily with lunch. , Disp: , Rfl:    vitamin C (ASCORBIC ACID) 500 MG tablet, Take 500 mg by mouth daily., Disp: , Rfl:    zinc gluconate 50 MG tablet, Take 50 mg by mouth daily., Disp: , Rfl:    nitroGLYCERIN (NITROSTAT) 0.4 MG SL tablet, Place 1 tablet (0.4 mg total) under the tongue every 5 (five) minutes x 3 doses as needed for chest pain. (Patient not taking: Reported on 09/23/2022), Disp: 25 tablet, Rfl: 0  Observations/Objective: There were no vitals filed for this visit. Physical Exam Constitutional:      General: He is not in acute distress.    Appearance: Normal appearance. He is not ill-appearing or toxic-appearing.  Eyes:     General: No scleral icterus.       Right eye: No discharge.        Left eye: No discharge.     Conjunctiva/sclera: Conjunctivae normal.  Pulmonary:     Effort: Pulmonary effort is normal. No respiratory distress.  Neurological:     Mental Status: He is alert and oriented to person, place, and time.  Psychiatric:        Mood and Affect: Mood normal.        Behavior: Behavior normal.        Thought Content: Thought content normal.        Judgment: Judgment normal.     Assessment and Plan: 1. COVID-19 Education provided on COVID-19.  Discussed signs and symptoms that should prompt him to go to the ER.  Advised a 5-day quarantine and wearing a mask up to 14 days when going out in public.  Recommended symptom management including throat lozenges, chloraseptic spray, warm salt water gargles, hot tea/honey, cough syrup (Delsym), Tylenol, Vicks, and a  humidifier at night. . Medication reactions checked with the [employer] COVID-19 drug interactions: patient was advised of the following while taking Paxlovid : ---> Stop atorvastatin and tamsulosin while taking and 3 days after stopping, decrease Eliquis to 2.5 mg twice daily, decrease tramadol from 50 mg to 25 mg daily as needed, and monitor blood pressure for hypotension.  If this occurs stop Entresto while taking Paxlovid. - nirmatrelvir/ritonavir (PAXLOVID) 20 x 150 MG & 10 x 100MG  TABS; Take 3 tablets by mouth 2 (two) times daily for 5 days. (Take  nirmatrelvir 150 mg two tablets twice daily for 5 days and ritonavir 100 mg one tablet twice daily for 5 days) Patient GFR is 71.37  Dispense: 30 tablet; Refill: 0 - dexamethasone (DECADRON) 6 MG tablet; Take 1 tablet (6 mg total) by mouth daily for 5 days.  Dispense: 5 tablet; Refill: 0 - albuterol (VENTOLIN HFA) 108 (90 Base) MCG/ACT inhaler; Inhale 2 puffs into the lungs every 6 (six) hours as needed for wheezing or shortness of breath.  Dispense: 18 g; Refill: 0   Follow Up Instructions: Return if symptoms worsen or fail to improve.   I discussed the assessment and treatment plan with the patient. The patient was provided an opportunity to ask questions, and all were answered. The patient agreed with the plan and demonstrated an understanding of the instructions.   The patient was advised to call back or seek an in-person evaluation if the symptoms worsen or if the condition fails to improve as anticipated.  The above assessment and management plan was discussed with the patient. The patient verbalized understanding of and has agreed to the management plan.   Gwenlyn Fudge, FNP

## 2022-09-23 NOTE — Patient Instructions (Addendum)
Throat lozenges, chloraseptic spray, warm salt water gargles, hot tea/honey, cough syrup (Delsym), Tylenol, Vicks, and a humidifier at night.   Stop Atorvastatin and Tamsulosin while taking Paxlovid and 3 days after stopping.  Decrease Eliquis from 5 mg twice daily down to 2.5 mg twice daily while taking Paxlovid.  Decrease tramadol from 50 mg to 25 mg daily as needed while taking Paxlovid.  Monitor your blood pressure.  We do not want it to go too low and cause you to feel dizzy or like you are going to pass out.  If this occurs you need to stop the Entresto while taking Paxlovid.

## 2022-09-23 NOTE — Telephone Encounter (Signed)
Patient had a virtual visit with [name] today 09/23/22. She prescribed him nirmatrelvir/ritonavir (PAXLOVID) 20 x 150 MG & 10 x 100MG  TABS for Covid and updated some other medications for taking with it. He called and said his pharmacy refused to fill it and didn't tell him why. He would like to know if his regular medications can be changed back to their normal dosages since he can't take Paxlovid. He would like a call back at [phone].

## 2022-09-23 NOTE — Telephone Encounter (Signed)
I have called the pharmacy to inquire why they did not fill the Paxlovid.  They explained that due to government guideline changes Paxlovid is no longer free for everyone.  It was not that they did not want to fill it, they wanted the patient to call a number so that it would be free when they filled it.  I have called patient back and given him this # [phone] and explained the situation to him.  We did discuss that the medication changes made today were only if he was going to be taking Paxlovid, so if he is not going to pick it up he would take his medications as previously prescribed.

## 2022-09-27 NOTE — Progress Notes (Signed)
EPIC Encounter for ICM Monitoring  Patient Name: [name] is a 77 y.o. male Date: 09/27/2022 Primary Care Physican: Corwin Levins, MD Primary Cardiologist: Excell Seltzer Electrophysiologist: Elberta Fortis Bi-V Pacing:   91.5%  Effective 89.93%      07/24/2022 Office Weight: 189 lbs   Time in AT/AF  <0.1 hr/day (<0.1%)                                                            Attempted call to patient and unable to reach.  Left detailed message per DPR regarding transmission. Transmission reviewed.  [place] and returned with COVID.   He is not feeling very well and taking meds as prescribed to help with COVID symptoms.    Optivol thoracic impedance suggesting possible fluid accumulation starting 4/28 and returned to baseline normal 5/6.   Prescribed: Spironolactone 25 mg take 0.5 tablet daily.   Labs: 02/19/2022 Creatinine 0.88, BUN 13, Potassium 4.4, Sodium 140, GFR 89 12/11/2021 Creatinine 0.89, BUN 19, Potassium 4.2, Sodium 138, GFR >60  11/26/2021 Creatinine 0.92, BUN 15, Potassium 3.9, Sodium 141, GFR >60  09/27/2021 Creatinine 0.88, BUN 16, Potassium 3.7, Sodium 140, GFR 82.91 09/18/2021 Creatinine 0.86, BUN 16, Potassium 4.3, Sodium 141   Recommendations:  No changes and encouraged to call if experiencing any fluid symptoms.   Follow-up plan: ICM clinic phone appointment on 10/28/2022.   91 day device clinic remote transmission 10/31/2022.       EP/Cardiology Office Visits:   12/09/2022 with Dr Excell Seltzer.     Copy of ICM check sent to Dr. Elberta Fortis.   3 month ICM trend: 09/23/2022.    12-14 Month ICM trend:     Karie Soda, RN 09/27/2022 2:50 PM

## 2022-10-02 ENCOUNTER — Telehealth: Payer: Self-pay | Admitting: Internal Medicine

## 2022-10-02 MED ORDER — HYDROCODONE BIT-HOMATROP MBR 5-1.5 MG/5ML PO SOLN: 5.0000 mL | Freq: Four times a day (QID) | ORAL | 0 refills | Status: AC | PRN

## 2022-10-02 NOTE — Telephone Encounter (Signed)
Patient had a virtual visit with [name] on 09/23/22 and was Covid positive. He said he still has congestion in his head and chest and is coughing up phlegm that was described as not clear. He would like to know what is recommended and he would like a call back at [phone].

## 2022-10-02 NOTE — Telephone Encounter (Signed)
Unfortunately the cough and fatigue are the two more common effects that can last several wks after the recent covid infection  You can also take Hycodan prn for cough (I will send rx), and/or Mucinex (or it's generic off brand) for congestion, and tylenol as needed for pain

## 2022-10-03 NOTE — Telephone Encounter (Signed)
Called and let Pt know

## 2022-10-07 DIAGNOSIS — M0569 Rheumatoid arthritis of multiple sites with involvement of other organs and systems: Secondary | ICD-10-CM | POA: Diagnosis not present

## 2022-10-07 DIAGNOSIS — Z79899 Other long term (current) drug therapy: Secondary | ICD-10-CM | POA: Diagnosis not present

## 2022-10-07 DIAGNOSIS — H2513 Age-related nuclear cataract, bilateral: Secondary | ICD-10-CM | POA: Diagnosis not present

## 2022-10-07 DIAGNOSIS — H35033 Hypertensive retinopathy, bilateral: Secondary | ICD-10-CM | POA: Diagnosis not present

## 2022-10-08 DIAGNOSIS — H2513 Age-related nuclear cataract, bilateral: Secondary | ICD-10-CM | POA: Diagnosis not present

## 2022-10-08 DIAGNOSIS — H53021 Refractive amblyopia, right eye: Secondary | ICD-10-CM | POA: Diagnosis not present

## 2022-10-08 DIAGNOSIS — M0569 Rheumatoid arthritis of multiple sites with involvement of other organs and systems: Secondary | ICD-10-CM | POA: Diagnosis not present

## 2022-10-08 DIAGNOSIS — Z79899 Other long term (current) drug therapy: Secondary | ICD-10-CM | POA: Diagnosis not present

## 2022-10-09 DIAGNOSIS — J019 Acute sinusitis, unspecified: Secondary | ICD-10-CM | POA: Diagnosis not present

## 2022-10-18 ENCOUNTER — Other Ambulatory Visit: Payer: Self-pay | Admitting: Rheumatology

## 2022-10-18 NOTE — Telephone Encounter (Signed)
Last Fill: 08/21/2022   UDS: 07/23/2022 UDS is consistent with tramadol use    Narc Agreement: 07/23/2022   Next Visit: 01/28/2023   Last Visit: 07/23/2022   Dx: Seropositive rheumatoid arthritis    Current Dose per office note on 07/23/2022:tramadol 50 mg p.o. p.o. nightly as needed pain      Okay to refill Tramadol?

## 2022-10-28 ENCOUNTER — Ambulatory Visit: Payer: Medicare Other | Attending: Cardiology

## 2022-10-28 DIAGNOSIS — I5022 Chronic systolic (congestive) heart failure: Secondary | ICD-10-CM

## 2022-10-28 DIAGNOSIS — Z95810 Presence of automatic (implantable) cardiac defibrillator: Secondary | ICD-10-CM

## 2022-10-30 ENCOUNTER — Telehealth: Payer: Self-pay

## 2022-10-30 NOTE — Progress Notes (Signed)
EPIC Encounter for ICM Monitoring  Patient Name: [name] is a 77 y.o. male Date: 10/30/2022 Primary Care Physican: Corwin Levins, MD Primary Cardiologist: Excell Seltzer Electrophysiologist: Elberta Fortis Bi-V Pacing:   96.0%  Effective 95.1%      07/24/2022 Office Weight: 189 lbs   Time in AT/AF  <0.1 hr/day (<0.1%)                                                            Attempted call to patient and unable to reach.  Left detailed message per DPR regarding transmission. Transmission reviewed.     Optivol thoracic impedance suggesting normal fluid levels within the last month.   Prescribed: Spironolactone 25 mg take 0.5 tablet daily.   Labs: 02/19/2022 Creatinine 0.88, BUN 13, Potassium 4.4, Sodium 140, GFR 89 12/11/2021 Creatinine 0.89, BUN 19, Potassium 4.2, Sodium 138, GFR >60  11/26/2021 Creatinine 0.92, BUN 15, Potassium 3.9, Sodium 141, GFR >60  09/27/2021 Creatinine 0.88, BUN 16, Potassium 3.7, Sodium 140, GFR 82.91 09/18/2021 Creatinine 0.86, BUN 16, Potassium 4.3, Sodium 141   Recommendations:  Left voice mail with ICM number and encouraged to call if experiencing any fluid symptoms.   Follow-up plan: ICM clinic phone appointment on 12/02/2022.   91 day device clinic remote transmission 01/30/2023.       EP/Cardiology Office Visits:   12/09/2022 with Dr Excell Seltzer.     Copy of ICM check sent to Dr. Elberta Fortis.   3 month ICM trend: 10/28/2022.    12-14 Month ICM trend:     Karie Soda, RN 10/30/2022 12:07 PM

## 2022-10-30 NOTE — Telephone Encounter (Signed)
Remote ICM transmission received.  Attempted call to patient regarding ICM remote transmission and left detailed message per DPR.  Left ICM phone number and advised to return call for any fluid symptoms or questions. Next ICM remote transmission scheduled 12/02/2022.

## 2022-10-31 ENCOUNTER — Ambulatory Visit (INDEPENDENT_AMBULATORY_CARE_PROVIDER_SITE_OTHER): Payer: Medicare Other

## 2022-10-31 DIAGNOSIS — I428 Other cardiomyopathies: Secondary | ICD-10-CM | POA: Diagnosis not present

## 2022-10-31 LAB — CUP PACEART REMOTE DEVICE CHECK
Battery Remaining Longevity: 14 mo
Battery Voltage: 2.89 V
Brady Statistic AP VP Percent: 0.26 %
Brady Statistic AP VS Percent: 0.01 %
Brady Statistic AS VP Percent: 97.48 %
Brady Statistic AS VS Percent: 2.25 %
Brady Statistic RA Percent Paced: 0.27 %
Brady Statistic RV Percent Paced: 37.15 %
Date Time Interrogation Session: 20240613001605
HighPow Impedance: 66 Ohm
Implantable Lead Connection Status: 753985
Implantable Lead Connection Status: 753985
Implantable Lead Connection Status: 753985
Implantable Lead Implant Date: 20181108
Implantable Lead Implant Date: 20181108
Implantable Lead Implant Date: 20181108
Implantable Lead Location: 753858
Implantable Lead Location: 753859
Implantable Lead Location: 753860
Implantable Lead Model: 4298
Implantable Lead Model: 5076
Implantable Pulse Generator Implant Date: 20181108
Lead Channel Impedance Value: 1121 Ohm
Lead Channel Impedance Value: 1121 Ohm
Lead Channel Impedance Value: 1197 Ohm
Lead Channel Impedance Value: 212.8 Ohm
Lead Channel Impedance Value: 216.848
Lead Channel Impedance Value: 268.078
Lead Channel Impedance Value: 292.657
Lead Channel Impedance Value: 300.368
Lead Channel Impedance Value: 361 Ohm
Lead Channel Impedance Value: 399 Ohm
Lead Channel Impedance Value: 456 Ohm
Lead Channel Impedance Value: 475 Ohm
Lead Channel Impedance Value: 551 Ohm
Lead Channel Impedance Value: 589 Ohm
Lead Channel Impedance Value: 608 Ohm
Lead Channel Impedance Value: 703 Ohm
Lead Channel Impedance Value: 779 Ohm
Lead Channel Impedance Value: 817 Ohm
Lead Channel Pacing Threshold Amplitude: 0.375 V
Lead Channel Pacing Threshold Amplitude: 0.625 V
Lead Channel Pacing Threshold Amplitude: 2.75 V
Lead Channel Pacing Threshold Pulse Width: 0.4 ms
Lead Channel Pacing Threshold Pulse Width: 0.4 ms
Lead Channel Pacing Threshold Pulse Width: 1 ms
Lead Channel Sensing Intrinsic Amplitude: 12.125 mV
Lead Channel Sensing Intrinsic Amplitude: 12.125 mV
Lead Channel Sensing Intrinsic Amplitude: 2.5 mV
Lead Channel Sensing Intrinsic Amplitude: 2.5 mV
Lead Channel Setting Pacing Amplitude: 2 V
Lead Channel Setting Pacing Amplitude: 2.5 V
Lead Channel Setting Pacing Amplitude: 3 V
Lead Channel Setting Pacing Pulse Width: 0.4 ms
Lead Channel Setting Pacing Pulse Width: 1 ms
Lead Channel Setting Sensing Sensitivity: 0.3 mV
Zone Setting Status: 755011
Zone Setting Status: 755011

## 2022-11-16 ENCOUNTER — Other Ambulatory Visit: Payer: Self-pay | Admitting: Rheumatology

## 2022-11-18 ENCOUNTER — Ambulatory Visit: Payer: Medicare Other | Admitting: Cardiovascular Disease

## 2022-11-18 NOTE — Telephone Encounter (Signed)
Last Fill: 10/18/2022  UDS:07/23/2022 UDS is consistent with tramadol use.   Narc Agreement: 07/23/2022  Next Visit: 01/28/2023  Last Visit: 07/23/2022  Dx: Seropositive rheumatoid arthritis   Current Dose per office note on 07/23/2022:tramadol 50 mg p.o. p.o. nightly as needed pain.    Okay to refill Tramadol?

## 2022-11-25 NOTE — Progress Notes (Signed)
Remote ICD transmission.   

## 2022-12-02 ENCOUNTER — Ambulatory Visit: Payer: Medicare Other | Attending: Cardiology

## 2022-12-02 DIAGNOSIS — I5022 Chronic systolic (congestive) heart failure: Secondary | ICD-10-CM | POA: Diagnosis not present

## 2022-12-02 DIAGNOSIS — Z95810 Presence of automatic (implantable) cardiac defibrillator: Secondary | ICD-10-CM

## 2022-12-03 ENCOUNTER — Telehealth: Payer: Self-pay

## 2022-12-03 NOTE — Progress Notes (Signed)
EPIC Encounter for ICM Monitoring  Patient Name: [name] is a 77 y.o. male Date: 12/03/2022 Primary Care Physican: Corwin Levins, MD Primary Cardiologist: Excell Seltzer Electrophysiologist: Elberta Fortis Bi-V Pacing:   96.5%   07/24/2022 Office Weight: 189 lbs   Time in AT/AF 0.0 hr/day (0.0%)                                                            Attempted call to patient and unable to reach.  Left detailed message per DPR regarding transmission. Transmission reviewed.     Optivol thoracic impedance suggesting possible fluid accumulation starting 6/10.  Fluid index > normal threshold starting 6/29.   Prescribed: Spironolactone 25 mg take 0.5 tablet daily.   Labs: 02/19/2022 Creatinine 0.88, BUN 13, Potassium 4.4, Sodium 140, GFR 89 12/11/2021 Creatinine 0.89, BUN 19, Potassium 4.2, Sodium 138, GFR >60  11/26/2021 Creatinine 0.92, BUN 15, Potassium 3.9, Sodium 141, GFR >60  09/27/2021 Creatinine 0.88, BUN 16, Potassium 3.7, Sodium 140, GFR 82.91 09/18/2021 Creatinine 0.86, BUN 16, Potassium 4.3, Sodium 141   Recommendations:  Left voice mail with ICM number and encouraged to call if experiencing any fluid symptoms.   Follow-up plan: ICM clinic phone appointment on 12/09/2022 to recheck fluid levels.   91 day device clinic remote transmission 01/30/2023.       EP/Cardiology Office Visits:   12/09/2022 with Dr Excell Seltzer.     Copy of ICM check sent to Dr. Elberta Fortis.   3 month ICM trend: 12/02/2022.    12-14 Month ICM trend:     Karie Soda, RN 12/03/2022 3:14 PM

## 2022-12-03 NOTE — Telephone Encounter (Signed)
Remote ICM transmission received.  Attempted call to patient regarding ICM remote transmission and left detailed message per DPR.  Left ICM phone number and advised to return call for any fluid symptoms or questions. Next ICM remote transmission scheduled 12/09/2022.    

## 2022-12-07 ENCOUNTER — Other Ambulatory Visit: Payer: Self-pay | Admitting: Cardiovascular Disease

## 2022-12-09 ENCOUNTER — Ambulatory Visit: Payer: BLUE CROSS/BLUE SHIELD | Attending: Cardiovascular Disease | Admitting: Cardiovascular Disease

## 2022-12-09 ENCOUNTER — Encounter: Payer: Self-pay | Admitting: Cardiovascular Disease

## 2022-12-09 ENCOUNTER — Other Ambulatory Visit: Payer: Self-pay

## 2022-12-09 ENCOUNTER — Ambulatory Visit (INDEPENDENT_AMBULATORY_CARE_PROVIDER_SITE_OTHER): Payer: Medicare Other

## 2022-12-09 VITALS — BP 116/70 | HR 56 | Ht 67.0 in | Wt 182.2 lb

## 2022-12-09 DIAGNOSIS — I5022 Chronic systolic (congestive) heart failure: Secondary | ICD-10-CM

## 2022-12-09 DIAGNOSIS — I480 Paroxysmal atrial fibrillation: Secondary | ICD-10-CM

## 2022-12-09 DIAGNOSIS — Z95810 Presence of automatic (implantable) cardiac defibrillator: Secondary | ICD-10-CM

## 2022-12-09 DIAGNOSIS — I2510 Atherosclerotic heart disease of native coronary artery without angina pectoris: Secondary | ICD-10-CM | POA: Diagnosis not present

## 2022-12-09 MED ORDER — ENTRESTO 49-51 MG PO TABS: 1.0000 | ORAL_TABLET | Freq: Two times a day (BID) | ORAL | 3 refills | Status: DC

## 2022-12-09 MED ORDER — NITROGLYCERIN 0.4 MG SL SUBL
SUBLINGUAL_TABLET | SUBLINGUAL | 6 refills | Status: AC
Start: 2022-12-09 — End: ?

## 2022-12-09 NOTE — Progress Notes (Unsigned)
12/10/2022 TYCEN DOCKTER [id number] July 01, 1945  Referring provider: Corwin Levins, MD Primary GI doctor: Dr. Meridee Score  ASSESSMENT AND PLAN:   Coronary artery disease involving native coronary artery of native heart without angina pectoris Patient had 2 episodes of chest pain, getting stress test this Thursday.  Will monitor for results and may need to get medical clearance from Dr. Excell Seltzer.   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 Meridee Score for surveillance.  Will schedule EGD. I discussed risks of EGD with patient today, including risk of sedation, bleeding or perforation.  Patient provides understanding and gave verbal consent to proceed.  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 Tissue/ fluid has not been obtained. 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  If changes, could consider adding EUS with EGD  Chronic anticoagulation Hold Eliquis for 2 days before procedure  will instruct when and how to resume after procedure.  Patient understands that there is a low but real risk of cardiovascular event such as heart attack, stroke, or embolism /  thrombosis, or ischemia while off Coumadin.  The patient consents to proceed.  Will communicate by phone or EMR with patient's prescribing provider to confirm that holding Coumadin is reasonable in this case.     Patient Care Team: Corwin Levins, MD as PCP - General (Internal Medicine) Regan Lemming, MD as PCP - Electrophysiology (Cardiology) Tonny Bollman, MD as PCP - Cardiology  (Cardiology)  HISTORY OF PRESENT ILLNESS: 77 y.o. male with a past medical history of CHF status post AICD ( echo 06/2022 EF 50-55%, no AS), AF on Eliquis follows Dr. 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 Marland Kitchen   01/14/2020 Colonoscopy with Dr. Christella Hartigan for diarrhea, unremarkable, no microscopic colitis.  01/11/2021 CT AB pelvis with contrast fatty liver, pancreas mild fatty atrophy of pancreas without change.  08/28/2021 office visit with Dr. Meridee Score for evaluation of ambulatory adenoma with previous polypectomy.   Scheduled for ERCP, consider EUS or CT protocol 1 to 2 years for follow-up of pancreatic neck cyst cannot have MRI/MRCP due to AICD. 11/29/2021 ERCP with Dr. Meridee Score showed stent and biliary trigger removed, prior biliary sphincterectomy appeared open, prior pancreatic sphincterectomy open, no evidence for recurrence of polypectomy site at that time.  Biliary tree swept and sludge was found. Please have repeat EGD with side-viewing endoscopy in 1 year for surveillance.   Patient has a BM every day. Rare constipation, does not take anything for it, has started walking and this has helped.  He is walking 3-5 miles and has had some chest pain twice in the last 2 months, he is getting a myoview stress test Thursday morning.  Patient denies change in bowel habits, diarrhea, hematochezia.  Denis AB pain or bloating.  Denies changes in appetite, unintentional weight loss.  Patient denies GERD, on protonix 40 mg every morning but states if he has fatty foods or sometimes normal meals can get sour  stomach/nausea, no vomiting, happens once or twice a week.  Lasts about 20 mins and then goes away, he does not take anything for it.  Patient denies dysphagia, melena.  He does not  drink, he does not smoke.     GI Procedures and Studies  October 2022 EGD/EUS EGD Impression: - No gross lesions in esophagus. Z-line irregular, 40 cm from the incisors. - 2 cm hiatal hernia. - Innumerable gastric polyps - fundic gland in appearance. No other gross lesions in the stomach. - A single duodenal polypoid lesion at the ampullary orifice with typical appearance of JNET 2a adenomatous tissue. - No other gross lesions in the entire visualized duodenum. EUS Impression: - A lesion was found at the ampulla. Tissue has not been obtained. However, the clinical appearance is highly suspicious for an adenoma. - A cystic lesion was seen in the genu/body transition of the pancreas. Tissue/fluid has not been obtained. 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 - There was no other sign of significant pathology in the pancreatic head, genu of the pancreas, pancreatic body and pancreatic tail. - There was no sign of significant pathology in the common bile duct. - No malignant-appearing lymph nodes were visualized in the celiac region (level 20), peripancreatic region and porta hepatis region.   October 2022 ERCP - Ampullary lesion noted as on EUS. - A biliary sphincterotomy was performed. - A pancreatic sphincterotomy was performed. - The major papilla was successfully injected. Snare endoscopic mucosal papillectomy of the major papilla was performed. Resection and retrieval were complete. - One temporary plastic pancreatic stent was placed into the ventral pancreatic duct. - One plastic biliary stent was placed into the common bile duct.   Pathology FINAL MICROSCOPIC DIAGNOSIS:  A. AMPULLA, BIOPSY:  - Ampullary adenoma (low-grade dysplasia)  - Negative for high-grade dysplasia or carcinoma   November 2022 EGD - Benign-appearing non-obstructive distal esophageal stenosis. - Small hiatal hernia. - Multiple gastric  polyps. - Non-bleeding duodenal ulcer with a flat pigmented spot (Forrest Class IIc) at site of ampullectomy. - No blood, no hematin in the UGI tract to D3. - No specimens collected.   November 2022 EGD - Small hiatal hernia with minor, non-obstructing Schatzki's ring. - Several soft polyps throughout the stomach - There were two stents extending across the major papilla into the duodenum. I removed the pancreatic stent with a snare. The stent fragmented during removal however the remaining piece was suctioned out through the endoscope. - The examination was otherwise normal.  He  reports that he quit smoking about 32 years ago. His smoking use included cigarettes. He has never been exposed to tobacco smoke. He has never used smokeless tobacco. He reports that he does not currently use alcohol. He reports that he does not use drugs.  RELEVANT LABS AND IMAGING: CBC    Component Value Date/Time   WBC 10.2 05/29/2022 1610   RBC 5.24 05/29/2022 1610   HGB 15.3 05/29/2022 1610   HGB 15.5 03/20/2017 1047   HCT 46.8 05/29/2022 1610   HCT 45.1 03/20/2017 1047   PLT 277.0 05/29/2022 1610   PLT 256 03/20/2017 1047   MCV 89.2 05/29/2022 1610   MCV 90 03/20/2017 1047   MCH 29.6 02/19/2022 1212   MCHC 32.6 05/29/2022 1610   RDW 15.2 05/29/2022 1610   RDW 13.0 03/20/2017 1047   LYMPHSABS 0.3 (L) 05/29/2022 1610   LYMPHSABS 1.8 03/20/2017 1047   MONOABS 0.6 05/29/2022 1610  EOSABS 0.1 05/29/2022 1610   EOSABS 0.1 03/20/2017 1047   BASOSABS 0.1 05/29/2022 1610   BASOSABS 0.0 03/20/2017 1047   Recent Labs    12/11/21 0316 02/19/22 1212 05/29/22 1610  HGB 11.3* 13.8 15.3    CMP     Component Value Date/Time   NA 138 05/29/2022 1610   NA 139 03/20/2017 1047   K 4.4 05/29/2022 1610   CL 103 05/29/2022 1610   CO2 26 05/29/2022 1610   GLUCOSE 103 (H) 05/29/2022 1610   BUN 25 (H) 05/29/2022 1610   BUN 17 03/20/2017 1047   CREATININE 1.01 05/29/2022 1610   CREATININE 0.88  02/19/2022 1212   CALCIUM 10.5 05/29/2022 1610   PROT 7.5 05/29/2022 1610   ALBUMIN 4.7 05/29/2022 1610   AST 15 05/29/2022 1610   ALT 17 05/29/2022 1610   ALKPHOS 86 05/29/2022 1610   BILITOT 1.2 05/29/2022 1610   GFRNONAA >60 12/11/2021 0316   GFRNONAA 86 11/09/2020 0945   GFRAA 100 11/09/2020 0945      Latest Ref Rng & Units 05/29/2022    4:10 PM 02/19/2022   12:12 PM 09/27/2021    8:15 AM  Hepatic Function  Total Protein 6.0 - 8.3 g/dL 7.5  7.1  6.2   Albumin 3.5 - 5.2 g/dL 4.7   4.2   AST 0 - 37 U/L 15  18  17    ALT 0 - 53 U/L 17  15  15    Alk Phosphatase 39 - 117 U/L 86   54   Total Bilirubin 0.2 - 1.2 mg/dL 1.2  0.7  1.0   Bilirubin, Direct 0.0 - 0.3 mg/dL 0.2   0.2       Current Medications:    Current Outpatient Medications (Cardiovascular):    atorvastatin (LIPITOR) 20 MG tablet, TAKE 1 TABLET BY MOUTH DAILY   carvedilol (COREG) 25 MG tablet, TAKE 1 TABLET BY MOUTH TWICE DAILY   nitroGLYCERIN (NITROSTAT) 0.4 MG SL tablet, Dissolve 1 tablet under the tongue every 5 minutes as needed for chest pain. Max of 3 doses, then 911.   sacubitril-valsartan (ENTRESTO) 49-51 MG, Take 1 tablet by mouth 2 (two) times daily.   spironolactone (ALDACTONE) 25 MG tablet, TAKE 1/2 TABLET(12.5 MG) BY MOUTH DAILY  Current Outpatient Medications (Respiratory):    albuterol (VENTOLIN HFA) 108 (90 Base) MCG/ACT inhaler, Inhale 2 puffs into the lungs every 6 (six) hours as needed for wheezing or shortness of breath.  Current Outpatient Medications (Analgesics):    acetaminophen (TYLENOL) 650 MG CR tablet, Take 650 mg by mouth in the morning and at bedtime.   traMADol (ULTRAM) 50 MG tablet, TAKE 1 TABLET(50 MG) BY MOUTH AT BEDTIME AS NEEDED  Current Outpatient Medications (Hematological):    apixaban (ELIQUIS) 5 MG TABS tablet, Take 1 tablet (5 mg total) by mouth 2 (two) times daily.   folic acid (FOLVITE) 400 MCG tablet, Take 400 mcg by mouth daily.  Current Outpatient Medications  (Other):    b complex vitamins tablet, Take 1 tablet by mouth daily with lunch.   betamethasone dipropionate 0.05 % cream, SMARTSIG:1 sparingly Topical Twice Daily   cholecalciferol (VITAMIN D3) 25 MCG (1000 UNIT) tablet, Take 1,000 Units by mouth daily.   Coenzyme Q10 (CO Q 10 PO), Take 200 mg by mouth daily with lunch.   Flaxseed, Linseed, (FLAXSEED OIL PO), Take 1 capsule by mouth daily. With Omega 3   fluocinonide (LIDEX) 0.05 % external solution, APPLY 1 ML TOPICALLY TO THE SCALP  TWICE DAILY   Glucosamine HCl (GLUCOSAMINE PO), Take 1,500 mg by mouth every evening.   hydrocortisone 2.5 % cream, Apply 1 application topically 2 (two) times daily as needed (rash on nose).    hydroxychloroquine (PLAQUENIL) 200 MG tablet, TAKE 1 TABLET BY MOUTH EVERY DAY ON MONDAY-FRIDAY ONLY, DO NOT TAKE ON SATURDAY OR SUNDAY   Lutein 20 MG TABS, Take 20 mg by mouth daily.   Multiple Vitamin (MULTIVITAMIN) capsule, Take 1 capsule by mouth daily.     NON FORMULARY, CPAP machine with sleep.   pantoprazole (PROTONIX) 40 MG tablet, Take 1 tablet (40 mg total) by mouth daily. before breakfast   polyethylene glycol (MIRALAX / GLYCOLAX) 17 g packet, Take 17 g by mouth daily as needed for mild constipation.   Probiotic Product (PROBIOTIC DAILY PO), Take 1 capsule by mouth daily with lunch.    RESTASIS 0.05 % ophthalmic emulsion, Place 1 drop into both eyes 2 (two) times daily as needed (dry eyes).   Saw Palmetto, Serenoa repens, (SAW PALMETTO PO), Take 450 mg by mouth every evening.   tamsulosin (FLOMAX) 0.4 MG CAPS capsule, Take 0.4 mg by mouth daily.   Testosterone 20 % CREA, Apply 2 mLs topically daily. Rub on shoulder   tobramycin-dexamethasone (TOBRADEX) ophthalmic ointment, APPLY A SMALL AMOUNT ONTO ITCHING EYELID AREA 3X/DAY FOR 1 WEEK THEN DAILY FOR 1 WEEK THEN EVERY OTHER DAY FOR 2 WEEKS.   TURMERIC PO, Take 1 tablet by mouth daily with lunch.    vitamin C (ASCORBIC ACID) 500 MG tablet, Take 500 mg by mouth  daily.   zinc gluconate 50 MG tablet, Take 50 mg by mouth daily.  Medical History:  Past Medical History:  Diagnosis Date   AICD (automatic cardioverter/defibrillator) present    Allergic rhinitis    Allergy    Anginal pain (HCC) 05/26/14   chest pain after chasing dog   Atypical nevus of back 04/27/2003   moderate - mid lower back   Basal cell carcinoma 01/26/2008   right cheek - MOHs   CAD (coronary artery disease)    hx of stent- 2005 RCA   Cataract    beginning stage both eyes   CHF (congestive heart failure) (HCC)    pacemaker Medtronic     Chronic back pain    intermittent   Constipation    Dizziness    Dysrhythmia    right bundle branch block    Esophageal stricture    GERD (gastroesophageal reflux disease)    Hemoptysis    Hiatal hernia    History of colonic polyps    hyperplastic   HTN (hypertension)    Hyperlipidemia    Hypertrophy of prostate with urinary obstruction and other lower urinary tract symptoms (LUTS)    OA (osteoarthritis)    OSA (obstructive sleep apnea)    cpap- 10    Other specified disorder of stomach and duodenum    duodenal periampulary tubulovillous adenoma removed by Dr. Christella Hartigan 5/10   Pericarditis 07/06/2019   Pneumonia    Pre-diabetes    no medications   Shortness of breath    with exertion   Sleep apnea    cpap   Testicular hypofunction    Allergies:  Allergies  Allergen Reactions   Sulfonamide Derivatives Rash          Surgical History:  He  has a past surgical history that includes s/p right knee arthroscopy (2005); RCA stenting; hernia surgery x 3; Oral surg to removed growth from ?  sinus (10/2010); Colonoscopy (12/2007); Upper gastrointestinal endoscopy; right toe surgery  (Right); Irrigation and debridement abscess (Left, 08/14/2012); Cardiac catheterization; left heart catheterization with coronary angiogram (N/A, 05/26/2014); Colonoscopy with propofol (N/A, 06/02/2014); Esophagogastroduodenoscopy (egd) with propofol (N/A,  06/02/2014); Coronary angioplasty (08/2003); Lumbar laminectomy/decompression microdiscectomy (Left, 09/26/2014); Total knee arthroplasty (Right, 03/12/2016); RIGHT/LEFT HEART CATH AND CORONARY ANGIOGRAPHY (N/A, 01/15/2017); BIV ICD INSERTION CRT-D (N/A, 03/27/2017); Pacemaker insertion (04/03/2017); Joint replacement (Right, 2017); Knee Arthroplasty; Polypectomy; thumb surgery (Right); Ventral hernia repair (N/A, 03/20/2018); Insertion of mesh (N/A, 03/20/2018); Mole removal (Left, 03/20/2018); LEFT HEART CATH AND CORONARY ANGIOGRAPHY (N/A, 01/24/2019); Colonoscopy (12/2019); Upper esophageal endoscopic ultrasound (eus) (N/A, 03/19/2021); Endoscopic retrograde cholangiopancreatography (ercp) with propofol (N/A, 03/19/2021); Esophagogastroduodenoscopy (egd) with propofol (N/A, 03/19/2021); Endoscopic mucosal resection (03/19/2021); Submucosal lifting injection (03/19/2021); Sphincterotomy (03/19/2021); biliary stent placement (N/A, 03/19/2021); pancreatic stent placement (03/19/2021); Esophagogastroduodenoscopy (egd) with propofol (N/A, 03/23/2021); Endoscopic retrograde cholangiopancreatography (ercp) with propofol (N/A, 11/29/2021); Stent removal (11/29/2021); removal of stones (11/29/2021); and Total knee arthroplasty (Left, 12/10/2021). Family History:  His family history includes Arthritis in his brother and daughter; COPD in his brother; Cancer in his brother; Coronary artery disease in his father; Diabetes in his father; Heart disease in his father and mother; Heart failure in his brother; Hypertension in an other family member; Stomach cancer in his paternal grandmother; Sudden death in his father.  REVIEW OF SYSTEMS  : All other systems reviewed and negative except where noted in the History of Present Illness.  PHYSICAL EXAM: BP 124/74   Pulse 79   Ht 5\' 7"  (1.702 m)   Wt 182 lb (82.6 kg)   BMI 28.51 kg/m  General Appearance: Well nourished, in no apparent distress. Head:   Normocephalic and  atraumatic. Eyes:  sclerae anicteric,conjunctive pink  Respiratory: Respiratory effort normal, BS equal bilaterally without rales, rhonchi, wheezing. Cardio: RRR with no MRGs. Peripheral pulses intact.  Abdomen: Soft,  Obese ,active bowel sounds. No tenderness . Without guarding and Without rebound. No masses. Rectal: Not evaluated Musculoskeletal: Full ROM, Normal gait. Without edema. Skin:  Dry and intact without significant lesions or rashes Neuro: Alert and  oriented x4;  No focal deficits. Psych:  Cooperative. Normal mood and affect.    Doree Albee, PA-C 9:31 AM

## 2022-12-09 NOTE — Patient Instructions (Signed)
Medication Instructions:  Your physician recommends that you continue on your current medications as directed. Please refer to the Current Medication list given to you today.  *If you need a refill on your cardiac medications before your next appointment, please call your pharmacy*  Lab Work: NONE If you have labs (blood work) drawn today and your tests are completely normal, you will receive your results only by: MyChart Message (if you have MyChart) OR A paper copy in the mail If you have any lab test that is abnormal or we need to change your treatment, we will call you to review the results.  Testing/Procedures: Eugenie Birks Stress Test Your physician has requested that you have a lexiscan myoview. For further information please visit [email or web address] Please follow instruction sheet, as given.  Follow-Up: At [facility], you and your health needs are our priority.  As part of our continuing mission to provide you with exceptional heart care, we have created designated Provider Care Teams.  These Care Teams include your primary Cardiologist (physician) and Advanced Practice Providers (APPs -  Physician Assistants and Nurse Practitioners) who all work together to provide you with the care you need, when you need it.  Your next appointment:   6 month(s)  Provider:   Tonny Bollman, MD      Other Instructions See separate sheet for stress test instructions

## 2022-12-09 NOTE — Progress Notes (Signed)
Cardiology Office Note:    Date:  12/09/2022   ID:  [name], DOB [birth year], MRN [id number]  PCP:  Corwin Levins, MD   [facility] Providers Cardiologist:  Tonny Bollman, MD Electrophysiologist:  Regan Lemming, MD     Referring MD: Corwin Levins, MD   Chief Complaint  Patient presents with   Coronary Artery Disease    History of Present Illness:    [name] is a 77 y.o. male with a hx of chronic systolic heart failure with left bundle branch block, coronary artery disease with remote stenting of the RCA, and paroxysmal atrial fibrillation.  He presents for follow-up evaluation today.  He underwent CRT-D implantation in 2018.  He was ultimately found to have atrial fibrillation on remote device monitoring and was started on anticoagulation with apixaban.  His antiplatelet therapy was stopped at that time.  LV function normalized after cardiac resynchronization.  The patient is here alone today.  He had COVID infection in April of this year and had a fairly prolonged recovery from this.  He now is doing quite well and is walking 4 to 5 miles daily with his wife.  He feels well, but he has had a few episodes of chest pressure that have occurred at the end of his walk.  This happened once during a walk here locally and another time at the beach.  Symptoms resolved within a few minutes of rest.  He has not had any resting chest discomfort or discomfort associated with low-level exertion.  He denies edema, heart palpitations, lightheadedness, or syncope.  He is doing well with his medications and he is compliant.  Past Medical History:  Diagnosis Date   AICD (automatic cardioverter/defibrillator) present    Allergic rhinitis    Allergy    Anginal pain (HCC) 05/26/14   chest pain after chasing dog   Atypical nevus of back 04/27/2003   moderate - mid lower back   Basal cell carcinoma 01/26/2008   right cheek - MOHs   CAD (coronary artery disease)    hx of  stent- 2005 RCA   Cataract    beginning stage both eyes   CHF (congestive heart failure) (HCC)    pacemaker Medtronic     Chronic back pain    intermittent   Constipation    Dizziness    Dysrhythmia    right bundle branch block    Esophageal stricture    GERD (gastroesophageal reflux disease)    Hemoptysis    Hiatal hernia    History of colonic polyps    hyperplastic   HTN (hypertension)    Hyperlipidemia    Hypertrophy of prostate with urinary obstruction and other lower urinary tract symptoms (LUTS)    OA (osteoarthritis)    OSA (obstructive sleep apnea)    cpap- 10    Other specified disorder of stomach and duodenum    duodenal periampulary tubulovillous adenoma removed by Dr. Christella Hartigan 5/10   Pericarditis 07/06/2019   Pneumonia    Pre-diabetes    no medications   Shortness of breath    with exertion   Sleep apnea    cpap   Testicular hypofunction     Past Surgical History:  Procedure Laterality Date   BILIARY STENT PLACEMENT N/A 03/19/2021   Procedure: BILIARY STENT PLACEMENT;  Surgeon: Lemar Lofty., MD;  Location: [facility];  Service: Gastroenterology;  Laterality: N/A;   BIV ICD INSERTION CRT-D N/A 03/27/2017   Procedure: BIV  ICD INSERTION CRT-D;  Surgeon: Regan Lemming, MD;  Location: [facility] INVASIVE CV LAB;  Service: Cardiovascular;  Laterality: N/A;   CARDIAC CATHETERIZATION     '05, last 2009, showing patent RCA stent   COLONOSCOPY  12/2007   HYPERPLASTIC POLYP   COLONOSCOPY  12/2019   COLONOSCOPY WITH PROPOFOL N/A 06/02/2014   Procedure: COLONOSCOPY WITH PROPOFOL;  Surgeon: Rachael Fee, MD;  Location: [facility] ENDOSCOPY;  Service: Endoscopy;  Laterality: N/A;   CORONARY ANGIOPLASTY  08/2003   ENDOSCOPIC MUCOSAL RESECTION  03/19/2021   Procedure: ENDOSCOPIC MUCOSAL RESECTION, ampullectomy;  Surgeon: Lemar Lofty., MD;  Location: [facility];  Service: Gastroenterology;;   ENDOSCOPIC RETROGRADE CHOLANGIOPANCREATOGRAPHY (ERCP) WITH  PROPOFOL N/A 03/19/2021   Procedure: ENDOSCOPIC RETROGRADE CHOLANGIOPANCREATOGRAPHY (ERCP) WITH PROPOFOL;  Surgeon: Lemar Lofty., MD;  Location: [facility];  Service: Gastroenterology;  Laterality: N/A;   ENDOSCOPIC RETROGRADE CHOLANGIOPANCREATOGRAPHY (ERCP) WITH PROPOFOL N/A 11/29/2021   Procedure: ENDOSCOPIC RETROGRADE CHOLANGIOPANCREATOGRAPHY (ERCP) WITH PROPOFOL;  Surgeon: Meridee Score Netty Starring., MD;  Location: [facility] ENDOSCOPY;  Service: Gastroenterology;  Laterality: N/A;   ESOPHAGOGASTRODUODENOSCOPY (EGD) WITH PROPOFOL N/A 06/02/2014   Procedure: ESOPHAGOGASTRODUODENOSCOPY (EGD) WITH PROPOFOL;  Surgeon: Rachael Fee, MD;  Location: [facility];  Service: Endoscopy;  Laterality: N/A;   ESOPHAGOGASTRODUODENOSCOPY (EGD) WITH PROPOFOL N/A 03/19/2021   Procedure: ESOPHAGOGASTRODUODENOSCOPY (EGD) WITH PROPOFOL;  Surgeon: Meridee Score Netty Starring., MD;  Location: [facility];  Service: Gastroenterology;  Laterality: N/A;   ESOPHAGOGASTRODUODENOSCOPY (EGD) WITH PROPOFOL N/A 03/23/2021   Procedure: ESOPHAGOGASTRODUODENOSCOPY (EGD) WITH PROPOFOL;  Surgeon: Meryl Dare, MD;  Location: [facility];  Service: Endoscopy;  Laterality: N/A;   hernia surgery x 3     Bilateral Inguinal, Umbicial   INSERTION OF MESH N/A 03/20/2018   Procedure: INSERTION OF MESH;  Surgeon: Ovidio Kin, MD;  Location: [facility] OR;  Service: General;  Laterality: N/A;   IRRIGATION AND DEBRIDEMENT ABSCESS Left 08/14/2012   Procedure: IRRIGATION AND DEBRIDEMENT LEFT INGUINAL BOIL ;  Surgeon: Kathi Ludwig, MD;  Location: [facility] ORS;  Service: Urology;  Laterality: Left;   JOINT REPLACEMENT Right 2017   KNEE ARTHROPLASTY     LEFT HEART CATH AND CORONARY ANGIOGRAPHY N/A 01/24/2019   Procedure: LEFT HEART CATH AND CORONARY ANGIOGRAPHY;  Surgeon: Swaziland, Peter M, MD;  Location: [facility] INVASIVE CV LAB;  Service: Cardiovascular;  Laterality: N/A;   LEFT HEART CATHETERIZATION WITH CORONARY ANGIOGRAM N/A 05/26/2014   Procedure:  LEFT HEART CATHETERIZATION WITH CORONARY ANGIOGRAM;  Surgeon: Micheline Chapman, MD;  Location: [facility] CATH LAB;  Service: Cardiovascular;  Laterality: N/A;   LUMBAR LAMINECTOMY/DECOMPRESSION MICRODISCECTOMY Left 09/26/2014   Procedure: Lumbar Laminectomy for resection of synovial cyst  Lumbar five- sacral one left;  Surgeon: Donalee Citrin, MD;  Location: [facility] NEURO ORS;  Service: Neurosurgery;  Laterality: Left;   MOLE REMOVAL Left 03/20/2018   Procedure: MOLE REMOVAL;  Surgeon: Ovidio Kin, MD;  Location: [facility] OR;  Service: General;  Laterality: Left;   Oral surg to removed growth from ?sinus  10/2010   ?Dermoid removed by DrRiggs   PACEMAKER INSERTION  04/03/2017   PANCREATIC STENT PLACEMENT  03/19/2021   Procedure: PANCREATIC STENT PLACEMENT;  Surgeon: Lemar Lofty., MD;  Location: [facility];  Service: Gastroenterology;;   POLYPECTOMY     RCA stenting     '05 RCA   REMOVAL OF STONES  11/29/2021   Procedure: REMOVAL OF SLUDGE;  Surgeon: Meridee Score Netty Starring., MD;  Location: [facility] ENDOSCOPY;  Service: Gastroenterology;;   right toe surgery  Right  Cyst    RIGHT/LEFT HEART CATH AND CORONARY ANGIOGRAPHY N/A 01/15/2017   Procedure: RIGHT/LEFT HEART CATH AND CORONARY ANGIOGRAPHY;  Surgeon: Tonny Bollman, MD;  Location: [facility] INVASIVE CV LAB;  Service: Cardiovascular;  Laterality: N/A;   s/p right knee arthroscopy  2005   SPHINCTEROTOMY  03/19/2021   Procedure: SPHINCTEROTOMY;  Surgeon: Mansouraty, Netty Starring., MD;  Location: [facility];  Service: Gastroenterology;;   Francine Graven REMOVAL  11/29/2021   Procedure: STENT REMOVAL;  Surgeon: Lemar Lofty., MD;  Location: [facility] ENDOSCOPY;  Service: Gastroenterology;;   SUBMUCOSAL LIFTING INJECTION  03/19/2021   Procedure: SUBMUCOSAL LIFTING INJECTION;  Surgeon: Lemar Lofty., MD;  Location: [facility];  Service: Gastroenterology;;   thumb surgery Right    TOTAL KNEE ARTHROPLASTY Right 03/12/2016   Procedure: RIGHT TOTAL KNEE  ARTHROPLASTY;  Surgeon: Durene Romans, MD;  Location: [facility] ORS;  Service: Orthopedics;  Laterality: Right;   TOTAL KNEE ARTHROPLASTY Left 12/10/2021   Procedure: TOTAL KNEE ARTHROPLASTY;  Surgeon: Ollen Gross, MD;  Location: [facility] ORS;  Service: Orthopedics;  Laterality: Left;   UPPER ESOPHAGEAL ENDOSCOPIC ULTRASOUND (EUS) N/A 03/19/2021   Procedure: UPPER ESOPHAGEAL ENDOSCOPIC ULTRASOUND (EUS);  Surgeon: Lemar Lofty., MD;  Location: [facility];  Service: Gastroenterology;  Laterality: N/A;   UPPER GASTROINTESTINAL ENDOSCOPY     VENTRAL HERNIA REPAIR N/A 03/20/2018   Procedure: LAPAROSCOPIC VENTRAL INCISIONAL  HERNIA ERAS PATHWAY;  Surgeon: Ovidio Kin, MD;  Location: [facility] OR;  Service: General;  Laterality: N/A;    Current Medications: Current Meds  Medication Sig   acetaminophen (TYLENOL) 650 MG CR tablet Take 650 mg by mouth in the morning and at bedtime.   albuterol (VENTOLIN HFA) 108 (90 Base) MCG/ACT inhaler Inhale 2 puffs into the lungs every 6 (six) hours as needed for wheezing or shortness of breath.   apixaban (ELIQUIS) 5 MG TABS tablet Take 1 tablet (5 mg total) by mouth 2 (two) times daily.   atorvastatin (LIPITOR) 20 MG tablet TAKE 1 TABLET BY MOUTH DAILY   b complex vitamins tablet Take 1 tablet by mouth daily with lunch.   betamethasone dipropionate 0.05 % cream SMARTSIG:1 sparingly Topical Twice Daily   carvedilol (COREG) 25 MG tablet TAKE 1 TABLET BY MOUTH TWICE DAILY   cholecalciferol (VITAMIN D3) 25 MCG (1000 UNIT) tablet Take 1,000 Units by mouth daily.   Coenzyme Q10 (CO Q 10 PO) Take 200 mg by mouth daily with lunch.   Flaxseed, Linseed, (FLAXSEED OIL PO) Take 1 capsule by mouth daily. With Omega 3   fluocinonide (LIDEX) 0.05 % external solution APPLY 1 ML TOPICALLY TO THE SCALP TWICE DAILY   folic acid (FOLVITE) 400 MCG tablet Take 400 mcg by mouth daily.   Glucosamine HCl (GLUCOSAMINE PO) Take 1,500 mg by mouth every evening.   hydrocortisone 2.5 % cream  Apply 1 application topically 2 (two) times daily as needed (rash on nose).    hydroxychloroquine (PLAQUENIL) 200 MG tablet TAKE 1 TABLET BY MOUTH EVERY DAY ON MONDAY-FRIDAY ONLY, DO NOT TAKE ON SATURDAY OR SUNDAY   Lutein 20 MG TABS Take 20 mg by mouth daily.   Multiple Vitamin (MULTIVITAMIN) capsule Take 1 capsule by mouth daily.     NON FORMULARY CPAP machine with sleep.   pantoprazole (PROTONIX) 40 MG tablet Take 1 tablet (40 mg total) by mouth daily. before breakfast   polyethylene glycol (MIRALAX / GLYCOLAX) 17 g packet Take 17 g by mouth daily as needed for mild constipation.   Probiotic Product (PROBIOTIC DAILY  PO) Take 1 capsule by mouth daily with lunch.    RESTASIS 0.05 % ophthalmic emulsion Place 1 drop into both eyes 2 (two) times daily as needed (dry eyes).   Saw Palmetto, Serenoa repens, (SAW PALMETTO PO) Take 450 mg by mouth every evening.   spironolactone (ALDACTONE) 25 MG tablet TAKE 1/2 TABLET(12.5 MG) BY MOUTH DAILY   tamsulosin (FLOMAX) 0.4 MG CAPS capsule Take 0.4 mg by mouth daily.   Testosterone 20 % CREA Apply 2 mLs topically daily. Rub on shoulder   tobramycin-dexamethasone (TOBRADEX) ophthalmic ointment APPLY A SMALL AMOUNT ONTO ITCHING EYELID AREA 3X/DAY FOR 1 WEEK THEN DAILY FOR 1 WEEK THEN EVERY OTHER DAY FOR 2 WEEKS.   traMADol (ULTRAM) 50 MG tablet TAKE 1 TABLET(50 MG) BY MOUTH AT BEDTIME AS NEEDED   TURMERIC PO Take 1 tablet by mouth daily with lunch.    vitamin C (ASCORBIC ACID) 500 MG tablet Take 500 mg by mouth daily.   zinc gluconate 50 MG tablet Take 50 mg by mouth daily.   [DISCONTINUED] nitroGLYCERIN (NITROSTAT) 0.4 MG SL tablet Place 1 tablet (0.4 mg total) under the tongue every 5 (five) minutes x 3 doses as needed for chest pain.   [DISCONTINUED] sacubitril-valsartan (ENTRESTO) 49-51 MG Take 1 tablet by mouth 2 (two) times daily.     Allergies:   Sulfonamide derivatives   Social History   Socioeconomic History   Marital status: Married    Spouse  name: Not on file   Number of children: 1   Years of education: Not on file   Highest education level: Not on file  Occupational History   Occupation: Advertising account planner: [employer]  Tobacco Use   Smoking status: Former    Current packs/day: 0.00    Types: Cigarettes    Quit date: 05/20/1990    Years since quitting: 32.5    Passive exposure: Never   Smokeless tobacco: Never   Tobacco comments:    previous 30 pack year history  Vaping Use   Vaping status: Never Used  Substance and Sexual Activity   Alcohol use: Not Currently    Comment: rare   Drug use: Never   Sexual activity: Not on file  Other Topics Concern   Not on file  Social History Narrative   Not on file   Social Determinants of Health   Financial Resource Strain: Not on file  Food Insecurity: Not on file  Transportation Needs: Not on file  Physical Activity: Not on file  Stress: Not on file  Social Connections: Not on file     Family History: The patient's family history includes Arthritis in his brother and daughter; COPD in his brother; Cancer in his brother; Coronary artery disease in his father; Diabetes in his father; Heart disease in his father and mother; Heart failure in his brother; Hypertension in an other family member; Stomach cancer in his paternal grandmother; Sudden death in his father. There is no history of Colon cancer, Esophageal cancer, Colon polyps, Ulcerative colitis, Liver disease, Pancreatic cancer, Inflammatory bowel disease, or Rectal cancer.  ROS:   Please see the history of present illness.    All other systems reviewed and are negative.  EKGs/Labs/Other Studies Reviewed:         Recent Labs: 05/29/2022: ALT 17; BUN 25; Creatinine, Ser 1.01; Hemoglobin 15.3; Platelets 277.0; Potassium 4.4; Sodium 138; TSH 2.26  Recent Lipid Panel    Component Value Date/Time   CHOL 121 05/29/2022 1610  CHOL 138 11/27/2016 0914   TRIG 175.0 (H) 05/29/2022 1610   HDL 35.70  (L) 05/29/2022 5784   HDL 35 (L) 11/27/2016 0914   CHOLHDL 3 05/29/2022 1610   VLDL 35.0 05/29/2022 1610   LDLCALC 51 05/29/2022 1610   LDLCALC 65 11/27/2016 0914   LDLDIRECT 75.0 09/27/2021 0815                Physical Exam:    VS:  BP 116/70   Pulse (!) 56   Ht 5\' 7"  (1.702 m)   Wt 182 lb 3.2 oz (82.6 kg)   SpO2 97%   BMI 28.54 kg/m     Wt Readings from Last 3 Encounters:  12/09/22 182 lb 3.2 oz (82.6 kg)  07/24/22 189 lb (85.7 kg)  07/23/22 188 lb 9.6 oz (85.5 kg)     GEN:  Well nourished, well developed in no acute distress HEENT: Normal NECK: No JVD; No carotid bruits LYMPHATICS: No lymphadenopathy CARDIAC: RRR, no murmurs, rubs, gallops RESPIRATORY:  Clear to auscultation without rales, wheezing or rhonchi  ABDOMEN: Soft, non-tender, non-distended MUSCULOSKELETAL:  No edema; No deformity  SKIN: Warm and dry NEUROLOGIC:  Alert and oriented x 3 PSYCHIATRIC:  Normal affect   ASSESSMENT:    1. Paroxysmal atrial fibrillation (HCC)   2. Chronic systolic heart failure (HCC)   3. Coronary artery disease involving native coronary artery of native heart without angina pectoris   4. Biventricular ICD (implantable cardioverter-defibrillator) in place    PLAN:    In order of problems listed above:  Anticoagulated with apixaban.  No bleeding problems reported.  Maintaining sinus rhythm.  Continue current management. Treated with Entresto, spironolactone, and carvedilol.  Doing well with no symptoms of heart failure.  Continue current management.  Last echo with LVEF 50 to 55%. He has developed typical symptoms of angina that only occur at the end of a long 4 to 5 mile walk.  I have recommended a stress Myoview for further evaluation as he has never had anginal symptoms in the past. Followed by EP.      Medication Adjustments/Labs and Tests Ordered: Current medicines are reviewed at length with the patient today.  Concerns regarding medicines are outlined above.   Orders Placed This Encounter  Procedures   MYOCARDIAL PERFUSION IMAGING   Meds ordered this encounter  Medications   nitroGLYCERIN (NITROSTAT) 0.4 MG SL tablet    Sig: Dissolve 1 tablet under the tongue every 5 minutes as needed for chest pain. Max of 3 doses, then 911.    Dispense:  25 tablet    Refill:  6    Patient Instructions  Medication Instructions:  Your physician recommends that you continue on your current medications as directed. Please refer to the Current Medication list given to you today.  *If you need a refill on your cardiac medications before your next appointment, please call your pharmacy*  Lab Work: NONE If you have labs (blood work) drawn today and your tests are completely normal, you will receive your results only by: MyChart Message (if you have MyChart) OR A paper copy in the mail If you have any lab test that is abnormal or we need to change your treatment, we will call you to review the results.  Testing/Procedures: Eugenie Birks Stress Test Your physician has requested that you have a lexiscan myoview. For further information please visit [email or web address] Please follow instruction sheet, as given.  Follow-Up: At [facility], you and your health needs are our  priority.  As part of our continuing mission to provide you with exceptional heart care, we have created designated Provider Care Teams.  These Care Teams include your primary Cardiologist (physician) and Advanced Practice Providers (APPs -  Physician Assistants and Nurse Practitioners) who all work together to provide you with the care you need, when you need it.  Your next appointment:   6 month(s)  Provider:   Tonny Bollman, MD      Other Instructions See separate sheet for stress test instructions   Signed, Tonny Bollman, MD  12/09/2022 1:10 PM    [facility]

## 2022-12-10 ENCOUNTER — Telehealth: Payer: Self-pay

## 2022-12-10 ENCOUNTER — Other Ambulatory Visit (INDEPENDENT_AMBULATORY_CARE_PROVIDER_SITE_OTHER): Payer: BLUE CROSS/BLUE SHIELD

## 2022-12-10 ENCOUNTER — Ambulatory Visit (INDEPENDENT_AMBULATORY_CARE_PROVIDER_SITE_OTHER): Payer: BLUE CROSS/BLUE SHIELD | Admitting: Physician Assistant

## 2022-12-10 ENCOUNTER — Telehealth (HOSPITAL_COMMUNITY): Payer: Self-pay | Admitting: *Deleted

## 2022-12-10 ENCOUNTER — Encounter: Payer: Self-pay | Admitting: Physician Assistant

## 2022-12-10 VITALS — BP 124/74 | HR 79 | Ht 67.0 in | Wt 182.0 lb

## 2022-12-10 DIAGNOSIS — R1900 Intra-abdominal and pelvic swelling, mass and lump, unspecified site: Secondary | ICD-10-CM

## 2022-12-10 DIAGNOSIS — I2510 Atherosclerotic heart disease of native coronary artery without angina pectoris: Secondary | ICD-10-CM

## 2022-12-10 DIAGNOSIS — D132 Benign neoplasm of duodenum: Secondary | ICD-10-CM | POA: Diagnosis not present

## 2022-12-10 DIAGNOSIS — Z7901 Long term (current) use of anticoagulants: Secondary | ICD-10-CM | POA: Diagnosis not present

## 2022-12-10 DIAGNOSIS — D135 Benign neoplasm of extrahepatic bile ducts: Secondary | ICD-10-CM | POA: Diagnosis not present

## 2022-12-10 DIAGNOSIS — K862 Cyst of pancreas: Secondary | ICD-10-CM

## 2022-12-10 LAB — CBC WITH DIFFERENTIAL/PLATELET
Basophils Absolute: 0.1 10*3/uL (ref 0.0–0.1)
Basophils Relative: 1 % (ref 0.0–3.0)
Eosinophils Absolute: 0.2 10*3/uL (ref 0.0–0.7)
Eosinophils Relative: 2.4 % (ref 0.0–5.0)
HCT: 45.4 % (ref 39.0–52.0)
Hemoglobin: 14.7 g/dL (ref 13.0–17.0)
Lymphocytes Relative: 22 % (ref 12.0–46.0)
Lymphs Abs: 1.4 10*3/uL (ref 0.7–4.0)
MCHC: 32.5 g/dL (ref 30.0–36.0)
MCV: 89.4 fl (ref 78.0–100.0)
Monocytes Absolute: 0.7 10*3/uL (ref 0.1–1.0)
Monocytes Relative: 10.7 % (ref 3.0–12.0)
Neutro Abs: 4 10*3/uL (ref 1.4–7.7)
Neutrophils Relative %: 63.9 % (ref 43.0–77.0)
Platelets: 274 10*3/uL (ref 150.0–400.0)
RBC: 5.07 Mil/uL (ref 4.22–5.81)
RDW: 15 % (ref 11.5–15.5)
WBC: 6.3 10*3/uL (ref 4.0–10.5)

## 2022-12-10 LAB — COMPREHENSIVE METABOLIC PANEL
ALT: 15 U/L (ref 0–53)
AST: 20 U/L (ref 0–37)
Albumin: 4.4 g/dL (ref 3.5–5.2)
Alkaline Phosphatase: 67 U/L (ref 39–117)
BUN: 13 mg/dL (ref 6–23)
CO2: 28 mEq/L (ref 19–32)
Calcium: 9.9 mg/dL (ref 8.4–10.5)
Chloride: 105 mEq/L (ref 96–112)
Creatinine, Ser: 0.87 mg/dL (ref 0.40–1.50)
GFR: 82.5 mL/min (ref >60.00)
Glucose, Bld: 105 mg/dL — ABNORMAL HIGH (ref 70–99)
Potassium: 4.2 mEq/L (ref 3.5–5.1)
Sodium: 138 mEq/L (ref 135–145)
Total Bilirubin: 1.2 mg/dL (ref 0.2–1.2)
Total Protein: 6.9 g/dL (ref 6.0–8.3)

## 2022-12-10 NOTE — Telephone Encounter (Signed)
Pt reached and given instructions for MPI study. 

## 2022-12-10 NOTE — Telephone Encounter (Signed)
Author: Mansouraty, Netty Starring., MD Service: Gastroenterology Author Type: Physician  Filed: 12/10/2022 11:42 AM Encounter Date: 12/10/2022 Status: Signed  Editor: Mansouraty, Netty Starring., MD (Physician)    Attending Physician's Attestation    I have reviewed the chart.    I agree with the Advanced Practitioner's note, impression, and recommendations with any updates as below. Can pursue EGD with side-viewing exam as well as an upper EUS to follow-up the pancreatic cyst. Please make this a formal EUS slot for scheduling purposes.     Corliss Parish, MD [facility] Advanced Endoscopy Office # [phone]

## 2022-12-10 NOTE — Patient Instructions (Addendum)
You have been scheduled for a CT scan of the abdomen and pelvis at [facility], 1st floor Radiology. You are scheduled on 12/19/2022 at 2:30pm. You should arrive at 12:15pm for your appointment time for registration.  Please follow the written instructions below on the day of your exam:   1) Do not eat anything 4 hours prior to your test   You may take any medications as prescribed with a small amount of water, if necessary. If you take any of the following medications: METFORMIN, GLUCOPHAGE, GLUCOVANCE, AVANDAMET, RIOMET, FORTAMET, ACTOPLUS MET, JANUMET, GLUMETZA or METAGLIP, you MAY be asked to HOLD this medication 48 hours AFTER the exam.   The purpose of you drinking the oral contrast is to aid in the visualization of your intestinal tract. The contrast solution may cause some diarrhea. Depending on your individual set of symptoms, you may also receive an intravenous injection of x-ray contrast/dye. Plan on being at [facility] for 45 minutes or longer, depending on the type of exam you are having performed.   If you have any questions regarding your exam or if you need to reschedule, you may call [facility] at [phone] between the hours of 8:00 am and 5:00 pm, Monday-Friday.   You have been scheduled for an endoscopy. Please follow written instructions given to you at your visit today.  If you use inhalers (even only as needed), please bring them with you on the day of your procedure.  If you take any of the following medications, they will need to be adjusted prior to your procedure:   DO NOT TAKE 7 DAYS PRIOR TO TEST- Trulicity (dulaglutide) Ozempic, Wegovy (semaglutide) Mounjaro (tirzepatide) Bydureon Bcise (exanatide extended release)  DO NOT TAKE 1 DAY PRIOR TO YOUR TEST Rybelsus (semaglutide) Adlyxin (lixisenatide) Victoza (liraglutide) Byetta (exanatide) ___________________________________________________________________________  We have you scheduled  for a previsit with the nurse on 02/27/2023 at 9:00am - this a telephone call with no charge.    Your provider has requested that you go to the basement level for lab work before leaving today. Press "B" on the elevator. The lab is located at the first door on the left as you exit the elevator.  _______________________________________________________  If your blood pressure at your visit was 140/90 or greater, please contact your primary care physician to follow up on this.  _______________________________________________________  If you are age 20 or older, your body mass index should be between 23-30. Your Body mass index is 28.51 kg/m. If this is out of the aforementioned range listed, please consider follow up with your Primary Care Provider.  If you are age 40 or younger, your body mass index should be between 19-25. Your Body mass index is 28.51 kg/m. If this is out of the aformentioned range listed, please consider follow up with your Primary Care Provider.   ________________________________________________________  [facility] [place] [facility] providers would like to encourage you to use [facility] to communicate with providers for non-urgent requests or questions.  Due to long hold times on the telephone, sending your provider a message by [facility] may be a faster and more efficient way to get a response.  Please allow 48 business hours for a response.  Please remember that this is for non-urgent requests.  _______________________________________________________ It was a pleasure to see you today!  Thank you for trusting me with your gastrointestinal care!

## 2022-12-10 NOTE — Progress Notes (Signed)
Attending Physician's Attestation   I have reviewed the chart.   I agree with the Advanced Practitioner's note, impression, and recommendations with any updates as below. Can pursue EGD with side-viewing exam as well as an upper EUS to follow-up the pancreatic cyst. Please make this a formal EUS slot for scheduling purposes.   Corliss Parish, MD [facility] Advanced [facility]oscopy Office # [phone]

## 2022-12-11 NOTE — Progress Notes (Signed)
EPIC Encounter for ICM Monitoring  Patient Name: [name] is a 77 y.o. male Date: 12/11/2022 Primary Care Physican: Corwin Levins, MD Primary Cardiologist: Excell Seltzer Electrophysiologist: Elberta Fortis Bi-V Pacing:   95.5%   07/24/2022 Office Weight: 189 lbs   Time in AT/AF 0.0 hr/day (0.0%)                                                            Transmission reviewed.     Optivol thoracic impedance suggesting fluid levels returned close to baseline.  Fluid index > normal threshold starting 6/29.   Prescribed: Spironolactone 25 mg take 0.5 tablet daily.   Labs: 12/10/2022 Creatinine 0.87, BUN 13, Potassium 4.2, Sodium 138, GFR 82.50  A complete set of results can be found in Results Review.   Recommendations:  No changes.   Follow-up plan: ICM clinic phone appointment on 01/06/2023.   91 day device clinic remote transmission 01/30/2023.       EP/Cardiology Office Visits:   Recall 06/07/2023 with Dr Excell Seltzer.    Recall 07/19/2023 with Francis Dowse, PA   Copy of ICM check sent to Dr. Elberta Fortis.    3 month ICM trend: 12/09/2022.    12-14 Month ICM trend:     Karie Soda, RN 12/11/2022 1:19 PM

## 2022-12-12 ENCOUNTER — Ambulatory Visit (HOSPITAL_COMMUNITY): Payer: Medicare Other | Attending: Cardiology

## 2022-12-12 ENCOUNTER — Other Ambulatory Visit: Payer: Self-pay

## 2022-12-12 ENCOUNTER — Telehealth: Payer: Self-pay

## 2022-12-12 DIAGNOSIS — I480 Paroxysmal atrial fibrillation: Secondary | ICD-10-CM

## 2022-12-12 DIAGNOSIS — K862 Cyst of pancreas: Secondary | ICD-10-CM

## 2022-12-12 DIAGNOSIS — I2510 Atherosclerotic heart disease of native coronary artery without angina pectoris: Secondary | ICD-10-CM | POA: Diagnosis not present

## 2022-12-12 DIAGNOSIS — I5022 Chronic systolic (congestive) heart failure: Secondary | ICD-10-CM

## 2022-12-12 DIAGNOSIS — Z95810 Presence of automatic (implantable) cardiac defibrillator: Secondary | ICD-10-CM

## 2022-12-12 LAB — MYOCARDIAL PERFUSION IMAGING
LV dias vol: 121 mL (ref 62–150)
LV sys vol: 69 mL
Nuc Stress EF: 43 %
Peak HR: 101 {beats}/min
Rest HR: 68 {beats}/min
Rest Nuclear Isotope Dose: 9.6 mCi
SDS: 1
SRS: 1
SSS: 2
ST Depression (mm): 0 mm
Stress Nuclear Isotope Dose: 29.6 mCi
TID: 0.97

## 2022-12-12 MED ORDER — TECHNETIUM TC 99M TETROFOSMIN IV KIT
29.6000 | PACK | Freq: Once | INTRAVENOUS | Status: AC | PRN
  Administered 2022-12-12: 29.6 via INTRAVENOUS

## 2022-12-12 MED ORDER — REGADENOSON 0.4 MG/5ML IV SOLN
0.4000 mg | Freq: Once | INTRAVENOUS | Status: AC
  Administered 2022-12-12: 0.4 mg via INTRAVENOUS

## 2022-12-12 MED ORDER — TECHNETIUM TC 99M TETROFOSMIN IV KIT
9.6000 | PACK | Freq: Once | INTRAVENOUS | Status: AC | PRN
  Administered 2022-12-12: 9.6 via INTRAVENOUS

## 2022-12-12 NOTE — Telephone Encounter (Signed)
Left message on machine to call back  

## 2022-12-12 NOTE — Telephone Encounter (Signed)
EUS scheduled, pt instructed and medications reviewed.  Patient instructions mailed to home.  Patient to call with any questions or concerns.  

## 2022-12-12 NOTE — Telephone Encounter (Signed)
[facility] Pre-operative Risk Assessment     Request for surgical clearance:     Endoscopy Procedure  What type of surgery is being performed?     EUS  When is this surgery scheduled?     10/26  What type of clearance is required ?   Pharmacy  Are there any medications that need to be held prior to surgery and how long? Eliquis  Practice name and name of physician performing surgery?      [facility]  What is your office phone and fax number?      Phone- [phone]  Fax- [phone]  Anesthesia type (None, local, MAC, general) ?       MAC

## 2022-12-12 NOTE — Telephone Encounter (Signed)
Pharmacy please advise on holding Eliquis prior to EDG scheduled for 03/15/2023 . Thank you.

## 2022-12-12 NOTE — Telephone Encounter (Signed)
EGD EUS has been scheduled for 02/24/23 at 10 am at [facility] with GM   Need to send Eliquis hold.

## 2022-12-13 NOTE — Telephone Encounter (Signed)
   Patient Name: [name]  DOB: [birth year] MRN: [id number]  Primary Cardiologist: Tonny Bollman, MD  Chart reviewed as part of pre-operative protocol coverage. Given past medical history and time since last visit, based on ACC/AHA guidelines, [name] is at acceptable risk for the planned procedure without further cardiovascular testing.   The patient was advised that if he develops new symptoms prior to surgery to contact our office to arrange for a follow-up visit, and he verbalized understanding.  Stress test was normal without ischemia on 12/12/2022  Per Pharmacy:  Per office protocol, patient can hold Eliquis for 2 days prior to procedure.      I will route this recommendation to the requesting party via Epic fax function and remove from pre-op pool.  Please call with questions.  Joni Reining, NP 12/13/2022, 4:33 PM

## 2022-12-13 NOTE — Telephone Encounter (Signed)
Patient with diagnosis of afib on Eliquis for anticoagulation.    Procedure: ESU Date of procedure: 03/15/23   CHA2DS2-VASc Score = 5   This indicates a 7.2% annual risk of stroke. The patient's score is based upon: CHF History: 1 HTN History: 1 Diabetes History: 0 Stroke History: 0 Vascular Disease History: 1 Age Score: 2 Gender Score: 0      CrCl 72 ml/min  Per office protocol, patient can hold Eliquis for 2 days prior to procedure.    **This guidance is not considered finalized until pre-operative APP has relayed final recommendations.**

## 2022-12-18 ENCOUNTER — Other Ambulatory Visit: Payer: Self-pay | Admitting: Physician Assistant

## 2022-12-18 NOTE — Telephone Encounter (Signed)
Last Fill: 11/18/2022  UDS:07/23/2022 UDS is consistent with tramadol use.   Narc Agreement: 07/23/2022   Next Visit: 01/28/2023  Last Visit: 07/23/2022  Dx: Seropositive rheumatoid arthritis   Current Dose per office note on 07/23/2022: ramadol 50 mg p.o. p.o. nightly as needed pain.   Okay to refill Tramadol?

## 2022-12-19 ENCOUNTER — Ambulatory Visit (HOSPITAL_COMMUNITY)
Admission: RE | Admit: 2022-12-19 | Discharge: 2022-12-19 | Disposition: A | Discharge status: Home / Self Care | Payer: Medicare Other | Source: Ambulatory Visit | Attending: Physician Assistant | Admitting: Physician Assistant

## 2022-12-19 DIAGNOSIS — N281 Cyst of kidney, acquired: Secondary | ICD-10-CM | POA: Diagnosis not present

## 2022-12-19 DIAGNOSIS — K8020 Calculus of gallbladder without cholecystitis without obstruction: Secondary | ICD-10-CM | POA: Diagnosis not present

## 2022-12-19 DIAGNOSIS — R1900 Intra-abdominal and pelvic swelling, mass and lump, unspecified site: Secondary | ICD-10-CM | POA: Diagnosis not present

## 2022-12-19 MED ORDER — IOHEXOL 300 MG/ML  SOLN
100.0000 mL | Freq: Once | INTRAMUSCULAR | Status: AC | PRN
  Administered 2022-12-19: 100 mL via INTRAVENOUS

## 2022-12-31 ENCOUNTER — Telehealth: Payer: Self-pay | Admitting: Internal Medicine

## 2022-12-31 NOTE — Telephone Encounter (Signed)
Pt called wanting a call back from nurse pt been dealing with severe diarrhea. please advise.

## 2022-12-31 NOTE — Telephone Encounter (Signed)
Pt states it Started yesterday... He had took a covid test but it was Negative. Inform pt can make him a appt for Thurs @ 9:20..lmb

## 2023-01-02 ENCOUNTER — Encounter: Payer: Self-pay | Admitting: Internal Medicine

## 2023-01-02 ENCOUNTER — Ambulatory Visit (INDEPENDENT_AMBULATORY_CARE_PROVIDER_SITE_OTHER): Payer: Medicare Other | Admitting: Internal Medicine

## 2023-01-02 VITALS — BP 120/74 | HR 80 | Temp 98.2°F | Ht 67.0 in | Wt 176.0 lb

## 2023-01-02 DIAGNOSIS — K529 Noninfective gastroenteritis and colitis, unspecified: Secondary | ICD-10-CM

## 2023-01-02 DIAGNOSIS — E559 Vitamin D deficiency, unspecified: Secondary | ICD-10-CM

## 2023-01-02 DIAGNOSIS — R7303 Prediabetes: Secondary | ICD-10-CM | POA: Diagnosis not present

## 2023-01-02 DIAGNOSIS — E782 Mixed hyperlipidemia: Secondary | ICD-10-CM | POA: Diagnosis not present

## 2023-01-02 DIAGNOSIS — E538 Deficiency of other specified B group vitamins: Secondary | ICD-10-CM

## 2023-01-02 DIAGNOSIS — Z125 Encounter for screening for malignant neoplasm of prostate: Secondary | ICD-10-CM

## 2023-01-02 DIAGNOSIS — I10 Essential (primary) hypertension: Secondary | ICD-10-CM

## 2023-01-02 LAB — POCT GLYCOSYLATED HEMOGLOBIN (HGB A1C): Hemoglobin A1C: 5.5 % (ref 4.0–5.6)

## 2023-01-02 NOTE — Progress Notes (Signed)
Patient ID: [name], male   DOB: Aug 28, 1945, 77 y.o.   MRN: [id number]        Chief Complaint: follow up diarrhea, daytime naps at 2 pm, predm,  htn, hld       HPI:  [name] is a 77 y.o. male here with 2 days onset watery loose stools without blood  fever or abd pain, no resolved today after he made his appt.  Pt denies chest pain, increased sob or doe, wheezing, orthopnea, PND, increased LE swelling, palpitations, dizziness or syncope.   Pt denies polydipsia, polyuria, or new focal neuro s/s.    Pt denies fever, wt loss, night sweats, loss of appetite, or other constitutional symptoms  Also naps every day at 2 pm at wife is concerned.          Wt Readings from Last 3 Encounters:  01/02/23 176 lb (79.8 kg)  12/12/22 182 lb (82.6 kg)  12/10/22 182 lb (82.6 kg)   BP Readings from Last 3 Encounters:  01/02/23 120/74  12/10/22 124/74  12/09/22 116/70         Past Medical History:  Diagnosis Date   AICD (automatic cardioverter/defibrillator) present    Allergic rhinitis    Allergy    Anginal pain (HCC) 05/26/14   chest pain after chasing dog   Atypical nevus of back 04/27/2003   moderate - mid lower back   Basal cell carcinoma 01/26/2008   right cheek - MOHs   CAD (coronary artery disease)    hx of stent- 2005 RCA   Cataract    beginning stage both eyes   CHF (congestive heart failure) (HCC)    pacemaker Medtronic     Chronic back pain    intermittent   Constipation    Dizziness    Dysrhythmia    right bundle branch block    Esophageal stricture    GERD (gastroesophageal reflux disease)    Hemoptysis    Hiatal hernia    History of colonic polyps    hyperplastic   HTN (hypertension)    Hyperlipidemia    Hypertrophy of prostate with urinary obstruction and other lower urinary tract symptoms (LUTS)    OA (osteoarthritis)    OSA (obstructive sleep apnea)    cpap- 10    Other specified disorder of stomach and duodenum    duodenal periampulary tubulovillous  adenoma removed by Dr. Christella Hartigan 5/10   Pericarditis 07/06/2019   Pneumonia    Pre-diabetes    no medications   Shortness of breath    with exertion   Sleep apnea    cpap   Testicular hypofunction    Past Surgical History:  Procedure Laterality Date   BILIARY STENT PLACEMENT N/A 03/19/2021   Procedure: BILIARY STENT PLACEMENT;  Surgeon: Lemar Lofty., MD;  Location: [facility];  Service: Gastroenterology;  Laterality: N/A;   BIV ICD INSERTION CRT-D N/A 03/27/2017   Procedure: BIV ICD INSERTION CRT-D;  Surgeon: Regan Lemming, MD;  Location: [facility] INVASIVE CV LAB;  Service: Cardiovascular;  Laterality: N/A;   CARDIAC CATHETERIZATION     '05, last 2009, showing patent RCA stent   COLONOSCOPY  12/2007   HYPERPLASTIC POLYP   COLONOSCOPY  12/2019   COLONOSCOPY WITH PROPOFOL N/A 06/02/2014   Procedure: COLONOSCOPY WITH PROPOFOL;  Surgeon: Rachael Fee, MD;  Location: [facility] ENDOSCOPY;  Service: Endoscopy;  Laterality: N/A;   CORONARY ANGIOPLASTY  08/2003   ENDOSCOPIC MUCOSAL RESECTION  03/19/2021   Procedure: ENDOSCOPIC  MUCOSAL RESECTION, ampullectomy;  Surgeon: Meridee Score Netty Starring., MD;  Location: [facility];  Service: Gastroenterology;;   ENDOSCOPIC RETROGRADE CHOLANGIOPANCREATOGRAPHY (ERCP) WITH PROPOFOL N/A 03/19/2021   Procedure: ENDOSCOPIC RETROGRADE CHOLANGIOPANCREATOGRAPHY (ERCP) WITH PROPOFOL;  Surgeon: Lemar Lofty., MD;  Location: [facility] ENDOSCOPY;  Service: Gastroenterology;  Laterality: N/A;   ENDOSCOPIC RETROGRADE CHOLANGIOPANCREATOGRAPHY (ERCP) WITH PROPOFOL N/A 11/29/2021   Procedure: ENDOSCOPIC RETROGRADE CHOLANGIOPANCREATOGRAPHY (ERCP) WITH PROPOFOL;  Surgeon: Meridee Score Netty Starring., MD;  Location: [facility] ENDOSCOPY;  Service: Gastroenterology;  Laterality: N/A;   ESOPHAGOGASTRODUODENOSCOPY (EGD) WITH PROPOFOL N/A 06/02/2014   Procedure: ESOPHAGOGASTRODUODENOSCOPY (EGD) WITH PROPOFOL;  Surgeon: Rachael Fee, MD;  Location: [facility] ENDOS[facility];  Service: Endoscopy;   Laterality: N/A;   ESOPHAGOGASTRODUODENOSCOPY (EGD) WITH PROPOFOL N/A 03/19/2021   Procedure: ESOPHAGOGASTRODUODENOSCOPY (EGD) WITH PROPOFOL;  Surgeon: Meridee Score Netty Starring., MD;  Location: [facility] ENDOSCOPY;  Service: Gastroenterology;  Laterality: N/A;   ESOPHAGOGASTRODUODENOSCOPY (EGD) WITH PROPOFOL N/A 03/23/2021   Procedure: ESOPHAGOGASTRODUODENOSCOPY (EGD) WITH PROPOFOL;  Surgeon: Meryl Dare, MD;  Location: [facility] ENDOSCOPY;  Service: Endoscopy;  Laterality: N/A;   hernia surgery x 3     Bilateral Inguinal, Umbicial   INSERTION OF MESH N/A 03/20/2018   Procedure: INSERTION OF MESH;  Surgeon: Ovidio Kin, MD;  Location: [facility] OR;  Service: General;  Laterality: N/A;   IRRIGATION AND DEBRIDEMENT ABSCESS Left 08/14/2012   Procedure: IRRIGATION AND DEBRIDEMENT LEFT INGUINAL BOIL ;  Surgeon: Kathi Ludwig, MD;  Location: [facility] ORS;  Service: Urology;  Laterality: Left;   JOINT REPLACEMENT Right 2017   KNEE ARTHROPLASTY     LEFT HEART CATH AND CORONARY ANGIOGRAPHY N/A 01/24/2019   Procedure: LEFT HEART CATH AND CORONARY ANGIOGRAPHY;  Surgeon: Swaziland, Peter M, MD;  Location: [facility] INVASIVE CV LAB;  Service: Cardiovascular;  Laterality: N/A;   LEFT HEART CATHETERIZATION WITH CORONARY ANGIOGRAM N/A 05/26/2014   Procedure: LEFT HEART CATHETERIZATION WITH CORONARY ANGIOGRAM;  Surgeon: Micheline Chapman, MD;  Location: [facility] CATH LAB;  Service: Cardiovascular;  Laterality: N/A;   LUMBAR LAMINECTOMY/DECOMPRESSION MICRODISCECTOMY Left 09/26/2014   Procedure: Lumbar Laminectomy for resection of synovial cyst  Lumbar five- sacral one left;  Surgeon: Donalee Citrin, MD;  Location: [facility] NEURO ORS;  Service: Neurosurgery;  Laterality: Left;   MOLE REMOVAL Left 03/20/2018   Procedure: MOLE REMOVAL;  Surgeon: Ovidio Kin, MD;  Location: [facility] OR;  Service: General;  Laterality: Left;   Oral surg to removed growth from ?sinus  10/2010   ?Dermoid removed by DrRiggs   PACEMAKER INSERTION  04/03/2017   PANCREATIC STENT PLACEMENT   03/19/2021   Procedure: PANCREATIC STENT PLACEMENT;  Surgeon: Lemar Lofty., MD;  Location: [facility] ENDOSCOPY;  Service: Gastroenterology;;   POLYPECTOMY     RCA stenting     '05 RCA   REMOVAL OF STONES  11/29/2021   Procedure: REMOVAL OF SLUDGE;  Surgeon: Meridee Score Netty Starring., MD;  Location: [facility] ENDOSCOPY;  Service: Gastroenterology;;   right toe surgery  Right    Cyst    RIGHT/LEFT HEART CATH AND CORONARY ANGIOGRAPHY N/A 01/15/2017   Procedure: RIGHT/LEFT HEART CATH AND CORONARY ANGIOGRAPHY;  Surgeon: Tonny Bollman, MD;  Location: [facility] INVASIVE CV LAB;  Service: Cardiovascular;  Laterality: N/A;   s/p right knee arthroscopy  2005   SPHINCTEROTOMY  03/19/2021   Procedure: SPHINCTEROTOMY;  Surgeon: Mansouraty, Netty Starring., MD;  Location: [facility];  Service: Gastroenterology;;   Francine Graven REMOVAL  11/29/2021   Procedure: STENT REMOVAL;  Surgeon: Lemar Lofty., MD;  Location: [facility] ENDOSCOPY;  Service: Gastroenterology;;  SUBMUCOSAL LIFTING INJECTION  03/19/2021   Procedure: SUBMUCOSAL LIFTING INJECTION;  Surgeon: Meridee Score Netty Starring., MD;  Location: [facility] ENDOSCOPY;  Service: Gastroenterology;;   thumb surgery Right    TOTAL KNEE ARTHROPLASTY Right 03/12/2016   Procedure: RIGHT TOTAL KNEE ARTHROPLASTY;  Surgeon: Durene Romans, MD;  Location: [facility] ORS;  Service: Orthopedics;  Laterality: Right;   TOTAL KNEE ARTHROPLASTY Left 12/10/2021   Procedure: TOTAL KNEE ARTHROPLASTY;  Surgeon: Ollen Gross, MD;  Location: [facility] ORS;  Service: Orthopedics;  Laterality: Left;   UPPER ESOPHAGEAL ENDOSCOPIC ULTRASOUND (EUS) N/A 03/19/2021   Procedure: UPPER ESOPHAGEAL ENDOSCOPIC ULTRASOUND (EUS);  Surgeon: Lemar Lofty., MD;  Location: [facility] ENDOS[facility];  Service: Gastroenterology;  Laterality: N/A;   UPPER GASTROINTESTINAL ENDOSCOPY     VENTRAL HERNIA REPAIR N/A 03/20/2018   Procedure: LAPAROSCOPIC VENTRAL INCISIONAL  HERNIA ERAS PATHWAY;  Surgeon: Ovidio Kin, MD;  Location: [facility] OR;  Service:  General;  Laterality: N/A;    reports that he quit smoking about 32 years ago. His smoking use included cigarettes. He has never been exposed to tobacco smoke. He has never used smokeless tobacco. He reports that he does not currently use alcohol. He reports that he does not use drugs. family history includes Arthritis in his brother and daughter; COPD in his brother; Cancer in his brother; Coronary artery disease in his father; Diabetes in his father; Heart disease in his father and mother; Heart failure in his brother; Hypertension in an other family member; Stomach cancer in his paternal grandmother; Sudden death in his father. Allergies  Allergen Reactions   Sulfonamide Derivatives Rash        Current Outpatient Medications on File Prior to Visit  Medication Sig Dispense Refill   acetaminophen (TYLENOL) 650 MG CR tablet Take 650 mg by mouth in the morning and at bedtime.     albuterol (VENTOLIN HFA) 108 (90 Base) MCG/ACT inhaler Inhale 2 puffs into the lungs every 6 (six) hours as needed for wheezing or shortness of breath. 18 g 0   apixaban (ELIQUIS) 5 MG TABS tablet Take 1 tablet (5 mg total) by mouth 2 (two) times daily. 180 tablet 1   atorvastatin (LIPITOR) 20 MG tablet TAKE 1 TABLET BY MOUTH DAILY 90 tablet 2   b complex vitamins tablet Take 1 tablet by mouth daily with lunch.     betamethasone dipropionate 0.05 % cream SMARTSIG:1 sparingly Topical Twice Daily     carvedilol (COREG) 25 MG tablet TAKE 1 TABLET BY MOUTH TWICE DAILY 180 tablet 3   cholecalciferol (VITAMIN D3) 25 MCG (1000 UNIT) tablet Take 1,000 Units by mouth daily.     Coenzyme Q10 (CO Q 10 PO) Take 200 mg by mouth daily with lunch.     Flaxseed, Linseed, (FLAXSEED OIL PO) Take 1 capsule by mouth daily. With Omega 3     fluocinonide (LIDEX) 0.05 % external solution APPLY 1 ML TOPICALLY TO THE SCALP TWICE DAILY     folic acid (FOLVITE) 400 MCG tablet Take 400 mcg by mouth daily.     Glucosamine HCl (GLUCOSAMINE PO) Take  1,500 mg by mouth every evening.     hydrocortisone 2.5 % cream Apply 1 application topically 2 (two) times daily as needed (rash on nose).      hydroxychloroquine (PLAQUENIL) 200 MG tablet TAKE 1 TABLET BY MOUTH EVERY DAY ON MONDAY-FRIDAY ONLY, DO NOT TAKE ON SATURDAY OR SUNDAY 60 tablet 0   Lutein 20 MG TABS Take 20 mg by mouth daily.  Multiple Vitamin (MULTIVITAMIN) capsule Take 1 capsule by mouth daily.       nitroGLYCERIN (NITROSTAT) 0.4 MG SL tablet Dissolve 1 tablet under the tongue every 5 minutes as needed for chest pain. Max of 3 doses, then 911. 25 tablet 6   NON FORMULARY CPAP machine with sleep.     pantoprazole (PROTONIX) 40 MG tablet Take 1 tablet (40 mg total) by mouth daily. before breakfast 90 tablet 3   polyethylene glycol (MIRALAX / GLYCOLAX) 17 g packet Take 17 g by mouth daily as needed for mild constipation. 14 each 0   Probiotic Product (PROBIOTIC DAILY PO) Take 1 capsule by mouth daily with lunch.      RESTASIS 0.05 % ophthalmic emulsion Place 1 drop into both eyes 2 (two) times daily as needed (dry eyes).     sacubitril-valsartan (ENTRESTO) 49-51 MG Take 1 tablet by mouth 2 (two) times daily. 180 tablet 3   Saw Palmetto, Serenoa repens, (SAW PALMETTO PO) Take 450 mg by mouth every evening.     spironolactone (ALDACTONE) 25 MG tablet TAKE 1/2 TABLET(12.5 MG) BY MOUTH DAILY 45 tablet 2   tamsulosin (FLOMAX) 0.4 MG CAPS capsule Take 0.4 mg by mouth daily.     Testosterone 20 % CREA Apply 2 mLs topically daily. Rub on shoulder     tobramycin-dexamethasone (TOBRADEX) ophthalmic ointment APPLY A SMALL AMOUNT ONTO ITCHING EYELID AREA 3X/DAY FOR 1 WEEK THEN DAILY FOR 1 WEEK THEN EVERY OTHER DAY FOR 2 WEEKS.     traMADol (ULTRAM) 50 MG tablet Take 1 tablet (50 mg total) by mouth at bedtime as needed. 30 tablet 0   TURMERIC PO Take 1 tablet by mouth daily with lunch.      vitamin C (ASCORBIC ACID) 500 MG tablet Take 500 mg by mouth daily.     zinc gluconate 50 MG tablet Take 50  mg by mouth daily.     No current facility-administered medications on file prior to visit.        ROS:  All others reviewed and negative.  Objective        PE:  BP 120/74 (BP Location: Right Arm, Patient Position: Sitting, Cuff Size: Normal)   Pulse 80   Temp 98.2 F (36.8 C) (Oral)   Ht 5\' 7"  (1.702 m)   Wt 176 lb (79.8 kg)   SpO2 96%   BMI 27.57 kg/m                 Constitutional: Pt appears in NAD               HENT: Head: NCAT.                Right Ear: External ear normal.                 Left Ear: External ear normal.                Eyes: . Pupils are equal, round, and reactive to light. Conjunctivae and EOM are normal               Nose: without d/c or deformity               Neck: Neck supple. Gross normal ROM               Cardiovascular: Normal rate and regular rhythm.                 Pulmonary/Chest: Effort normal and breath  sounds without rales or wheezing.                Abd:  Soft, NT, ND, + BS, no organomegaly               Neurological: Pt is alert. At baseline orientation, motor grossly intact               Skin: Skin is warm. No rashes, no other new lesions, LE edema - none               Psychiatric: Pt behavior is normal without agitation   Micro: none  Cardiac tracings I have personally interpreted today:  none  Pertinent Radiological findings (summarize): none   Lab Results  Component Value Date   WBC 6.3 12/10/2022   HGB 14.7 12/10/2022   HCT 45.4 12/10/2022   PLT 274.0 12/10/2022   GLUCOSE 105 (H) 12/10/2022   CHOL 121 05/29/2022   TRIG 175.0 (H) 05/29/2022   HDL 35.70 (L) 05/29/2022   LDLDIRECT 75.0 09/27/2021   LDLCALC 51 05/29/2022   ALT 15 12/10/2022   AST 20 12/10/2022   NA 138 12/10/2022   K 4.2 12/10/2022   CL 105 12/10/2022   CREATININE 0.87 12/10/2022   BUN 13 12/10/2022   CO2 28 12/10/2022   TSH 2.26 05/29/2022   PSA 2.37 09/27/2021   INR 1.1 03/23/2021   HGBA1C 5.5 01/02/2023   MICROALBUR 4.1 (H) 05/29/2022    Hemoglobin A1C 4.0 - 5.6 % 5.5 6.1   Assessment/Plan:  [name] is a 77 y.o. White or Caucasian [1] male with  has a past medical history of AICD (automatic cardioverter/defibrillator) present, Allergic rhinitis, Allergy, Anginal pain (HCC) (05/26/14), Atypical nevus of back (04/27/2003), Basal cell carcinoma (01/26/2008), CAD (coronary artery disease), Cataract, CHF (congestive heart failure) (HCC), Chronic back pain, Constipation, Dizziness, Dysrhythmia, Esophageal stricture, GERD (gastroesophageal reflux disease), Hemoptysis, Hiatal hernia, History of colonic polyps, HTN (hypertension), Hyperlipidemia, Hypertrophy of prostate with urinary obstruction and other lower urinary tract symptoms (LUTS), OA (osteoarthritis), OSA (obstructive sleep apnea), Other specified disorder of stomach and duodenum, Pericarditis (07/06/2019), Pneumonia, Pre-diabetes, Shortness of breath, Sleep apnea, and Testicular hypofunction.  Hyperlipidemia Lab Results  Component Value Date   LDLCALC 51 05/29/2022   Stable, pt to continue current statin lipitor 20 mg qd   Essential hypertension BP Readings from Last 3 Encounters:  01/02/23 120/74  12/10/22 124/74  12/09/22 116/70   Stable, pt to continue medical treatment coreg 25 bid   Prediabetes Lab Results  Component Value Date   HGBA1C 5.5 01/02/2023   Stable, pt to continue current medical treatment  - diet, wt control   Chronic diarrhea Etiology unclear, improved today, exam benign, ok to  to f/u any worsening symptoms or concerns  Followup: Return in about 6 months (around 07/05/2023).  Oliver Barre, MD 01/06/2023 8:54 PM [facility]

## 2023-01-02 NOTE — Patient Instructions (Signed)
Your A1c was done today  Please continue all other medications as before, and refills have been done if requested.  Please have the pharmacy call with any other refills you may need.  Please continue your efforts at being more active, low cholesterol diet, and weight control  Please keep your appointments with your specialists as you may have planned  Please make an Appointment to return in 6 months, or sooner if needed, also with Lab Appointment for testing done 3-5 days before at the [facility] (so this is for TWO appointments - please see the scheduling desk as you leave)

## 2023-01-06 ENCOUNTER — Ambulatory Visit: Payer: BLUE CROSS/BLUE SHIELD | Attending: Cardiology

## 2023-01-06 ENCOUNTER — Encounter: Payer: Self-pay | Admitting: Internal Medicine

## 2023-01-06 DIAGNOSIS — Z95810 Presence of automatic (implantable) cardiac defibrillator: Secondary | ICD-10-CM | POA: Diagnosis not present

## 2023-01-06 DIAGNOSIS — I5022 Chronic systolic (congestive) heart failure: Secondary | ICD-10-CM

## 2023-01-06 NOTE — Assessment & Plan Note (Signed)
Etiology unclear, improved today, exam benign, ok to  to f/u any worsening symptoms or concerns

## 2023-01-06 NOTE — Assessment & Plan Note (Signed)
Lab Results  Component Value Date   LDLCALC 51 05/29/2022   Stable, pt to continue current statin lipitor 20 mg qd

## 2023-01-06 NOTE — Assessment & Plan Note (Signed)
BP Readings from Last 3 Encounters:  01/02/23 120/74  12/10/22 124/74  12/09/22 116/70   Stable, pt to continue medical treatment coreg 25 bid

## 2023-01-06 NOTE — Assessment & Plan Note (Signed)
Lab Results  Component Value Date   HGBA1C 5.5 01/02/2023   Stable, pt to continue current medical treatment  - diet, wt control

## 2023-01-09 ENCOUNTER — Telehealth: Payer: Self-pay

## 2023-01-09 ENCOUNTER — Other Ambulatory Visit: Payer: Self-pay | Admitting: Rheumatology

## 2023-01-09 NOTE — Progress Notes (Signed)
EPIC Encounter for ICM Monitoring  Patient Name: [name] is a 77 y.o. male Date: 01/09/2023 Primary Care Physican: Corwin Levins, MD Primary Cardiologist: Excell Seltzer Electrophysiologist: Elberta Fortis Bi-V Pacing:   92.8%   07/24/2022 Office Weight: 189 lbs   Time in AT/AF  <0.1 hr/day (<0.1%)                                                            Attempted call to patient and unable to reach.  Left detailed message per DPR regarding transmission. Transmission reviewed.     Optivol thoracic impedance suggesting normal fluid levels with the exception of possible fluid accumulation from 8/4-8/10.   Prescribed: Spironolactone 25 mg take 0.5 tablet daily.   Labs: 12/10/2022 Creatinine 0.87, BUN 13, Potassium 4.2, Sodium 138, GFR 82.50  A complete set of results can be found in Results Review.   Recommendations:  Left voice mail with ICM number and encouraged to call if experiencing any fluid symptoms.   Follow-up plan: ICM clinic phone appointment on 02/10/2023.   91 day device clinic remote transmission 01/30/2023.       EP/Cardiology Office Visits:   Recall 06/07/2023 with Dr Excell Seltzer.    Recall 07/19/2023 with Francis Dowse, PA   Copy of ICM check sent to Dr. Elberta Fortis.     3 month ICM trend: 01/06/2023.    12-14 Month ICM trend:     Karie Soda, RN 01/09/2023 9:43 AM

## 2023-01-09 NOTE — Telephone Encounter (Signed)
Remote ICM transmission received.  Attempted call to patient regarding ICM remote transmission and left detailed message per DPR.  Left ICM phone number and advised to return call for any fluid symptoms or questions. Next ICM remote transmission scheduled 02/10/2023.

## 2023-01-14 ENCOUNTER — Other Ambulatory Visit: Payer: Self-pay | Admitting: Rheumatology

## 2023-01-14 DIAGNOSIS — M1990 Unspecified osteoarthritis, unspecified site: Secondary | ICD-10-CM

## 2023-01-15 NOTE — Progress Notes (Signed)
Office Visit Note  Patient: [name]             Date of Birth: Nov 11, 1945           MRN: [id number]             PCP: Corwin Levins, MD Referring: Corwin Levins, MD Visit Date: 01/28/2023 Occupation: @GUAROCC @  Subjective:  Pain in joints  History of Present Illness: [name] is a 77 y.o. male with seropositive rheumatoid arthritis, osteoarthritis and degenerative disc disease.  He states he continues to have pain and discomfort in his bilateral hands, knee joints and his shoulders.  He also has some discomfort in the left trochanteric region.  Lower back pain occurs only when he is standing.  He denies any radiculopathy.  He has difficulty climbing stairs due to knee joint discomfort.    Activities of Daily Living:  Patient reports morning stiffness for 3 minutes.   Patient Reports nocturnal pain.  Difficulty dressing/grooming: Denies Difficulty climbing stairs: Reports Difficulty getting out of chair: Reports Difficulty using hands for taps, buttons, cutlery, and/or writing: Reports  Review of Systems  Constitutional:  Positive for fatigue.  HENT:  Positive for mouth dryness. Negative for mouth sores.   Eyes:  Negative for dryness.  Respiratory:  Negative for shortness of breath.   Cardiovascular:  Negative for chest pain and palpitations.  Gastrointestinal:  Negative for blood in stool, constipation and diarrhea.  Endocrine: Negative for increased urination.  Genitourinary:  Negative for involuntary urination.  Musculoskeletal:  Positive for joint pain, joint pain, myalgias, muscle weakness, morning stiffness and myalgias. Negative for gait problem, joint swelling and muscle tenderness.  Skin:  Positive for hair loss. Negative for color change, rash and sensitivity to sunlight.  Allergic/Immunologic: Negative for susceptible to infections.  Neurological:  Negative for dizziness and headaches.  Hematological:  Negative for swollen glands.   Psychiatric/Behavioral:  Negative for depressed mood and sleep disturbance. The patient is not nervous/anxious.     PMFS History:  Patient Active Problem List   Diagnosis Date Noted   COVID 09/18/2022   Rupture of tendon of biceps, long head 07/11/2022   OSA (obstructive sleep apnea) 05/30/2022   Rhinorrhea 05/30/2022   Travel advice encounter 05/30/2022   Inflammation of sacroiliac joint (HCC) 05/29/2022   Encounter for well adult exam with abnormal findings 05/29/2022   Pain in joint of left knee 12/13/2021   Osteoarthritis of left knee 12/10/2021   Left hip pain 09/30/2021   Chronic anticoagulation 08/31/2021   History of ERCP 08/31/2021   History of biliary stent insertion 08/31/2021   Pancreatic cyst 08/31/2021   Trochanteric bursitis of left hip 06/23/2021   GI bleed 03/23/2021   Melena    Acute upper GI bleed 03/22/2021   AF (paroxysmal atrial fibrillation) (HCC) 03/22/2021   Acquired hallux rigidus of left foot 02/14/2021   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   Abnormal findings on esophagogastroduodenoscopy (EGD) 02/03/2021   Polyp of duodenum 02/03/2021   Axillary abscess 06/26/2020   Chronic diarrhea 06/26/2020   Fever 01/18/2020   Pericarditis 07/06/2019   Right hip pain 07/06/2019   Acute chest pain 01/24/2019   Prediabetes 12/24/2018   Rheumatoid arthritis (HCC) 12/24/2018   Preventative health care 06/25/2018   Incarcerated ventral hernia 03/20/2018   DCM (dilated cardiomyopathy) (HCC) 02/11/2017   Chronic systolic heart failure (HCC) 01/15/2017   History of pneumonia 10/23/2016   OA (osteoarthritis) of knee 10/23/2016   Primary osteoarthritis of both  feet 10/04/2016   Primary osteoarthritis of left knee 10/04/2016   DDD (degenerative disc disease), lumbar 10/04/2016   Hemoptysis 05/27/2016   Overweight (BMI 25.0-29.9) 03/14/2016   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   Degeneration of intervertebral disc of cervical spine without prolapsed disc 10/05/2015   Parotid  mass 08/23/2015   Dyspnea 11/29/2014   Synovial cyst of lumbar facet joint 09/26/2014   Incisional hernia, without obstruction or gangrene 12/06/2013   Nausea alone 08/27/2013   Colon cancer screening 08/27/2013   Spinal stenosis 04/23/2013   Displacement of lumbar intervertebral disc without myelopathy 12/01/2012   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   Benign prostatic hyperplasia with urinary obstruction 06/18/2010   Other specified disorder of stomach and duodenum 12/16/2008   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 11/15/2008   Gastroesophageal reflux disease 09/02/2008   COLONIC POLYPS, HYPERPLASTIC 05/24/2007   OSA on CPAP 05/24/2007   ALLERGIC RHINITIS 05/24/2007   ESOPHAGEAL STRICTURE 05/24/2007   HIATAL HERNIA 05/24/2007   Hyperlipidemia 02/06/2007   Essential hypertension 02/06/2007   Coronary artery disease involving native coronary artery of native heart without angina pectoris 02/06/2007   Primary osteoarthritis of both hands 02/06/2007   Neck pain on right side 02/06/2007    Past Medical History:  Diagnosis Date   AICD (automatic cardioverter/defibrillator) present    Allergic rhinitis    Allergy    Anginal pain (HCC) 05/26/14   chest pain after chasing dog   Atypical nevus of back 04/27/2003   moderate - mid lower back   Basal cell carcinoma 01/26/2008   right cheek - MOHs   CAD (coronary artery disease)    hx of stent- 2005 RCA   Cataract    beginning stage both eyes   CHF (congestive heart failure) (HCC)    pacemaker Medtronic     Chronic back pain    intermittent   Constipation    Dizziness    Dysrhythmia    right bundle branch block    Esophageal stricture    GERD (gastroesophageal reflux disease)    Hemoptysis    Hiatal hernia    History of colonic polyps    hyperplastic   HTN (hypertension)    Hyperlipidemia    Hypertrophy of prostate with urinary obstruction and other lower urinary tract symptoms (LUTS)    OA (osteoarthritis)    OSA  (obstructive sleep apnea)    cpap- 10    Other specified disorder of stomach and duodenum    duodenal periampulary tubulovillous adenoma removed by Dr. Christella Hartigan 5/10   Pericarditis 07/06/2019   Pneumonia    Pre-diabetes    no medications   Shortness of breath    with exertion   Sleep apnea    cpap   Testicular hypofunction     Family History  Problem Relation Age of Onset   Heart disease Mother    Coronary artery disease Father    Diabetes Father    Sudden death Father        due to heart disease   Heart disease Father    COPD Brother    Arthritis Brother    Heart failure Brother    Cancer Brother        lung    Stomach cancer Paternal Grandmother    Arthritis Daughter    Hypertension Other    Colon cancer Neg Hx    Esophageal cancer Neg Hx    Colon polyps Neg Hx    Ulcerative colitis Neg Hx  Liver disease Neg Hx    Pancreatic cancer Neg Hx    Inflammatory bowel disease Neg Hx    Rectal cancer Neg Hx    Past Surgical History:  Procedure Laterality Date   BILIARY STENT PLACEMENT N/A 03/19/2021   Procedure: BILIARY STENT PLACEMENT;  Surgeon: Lemar Lofty., MD;  Location: [facility] ENDOSCOPY;  Service: Gastroenterology;  Laterality: N/A;   BIV ICD INSERTION CRT-D N/A 03/27/2017   Procedure: BIV ICD INSERTION CRT-D;  Surgeon: Regan Lemming, MD;  Location: [facility] INVASIVE CV LAB;  Service: Cardiovascular;  Laterality: N/A;   CARDIAC CATHETERIZATION     '05, last 2009, showing patent RCA stent   COLONOSCOPY  12/2007   HYPERPLASTIC POLYP   COLONOSCOPY  12/2019   COLONOSCOPY WITH PROPOFOL N/A 06/02/2014   Procedure: COLONOSCOPY WITH PROPOFOL;  Surgeon: Rachael Fee, MD;  Location: [facility];  Service: Endoscopy;  Laterality: N/A;   CORONARY ANGIOPLASTY  08/2003   ENDOSCOPIC MUCOSAL RESECTION  03/19/2021   Procedure: ENDOSCOPIC MUCOSAL RESECTION, ampullectomy;  Surgeon: Lemar Lofty., MD;  Location: [facility];  Service: Gastroenterology;;   ENDOSCOPIC  RETROGRADE CHOLANGIOPANCREATOGRAPHY (ERCP) WITH PROPOFOL N/A 03/19/2021   Procedure: ENDOSCOPIC RETROGRADE CHOLANGIOPANCREATOGRAPHY (ERCP) WITH PROPOFOL;  Surgeon: Lemar Lofty., MD;  Location: [facility];  Service: Gastroenterology;  Laterality: N/A;   ENDOSCOPIC RETROGRADE CHOLANGIOPANCREATOGRAPHY (ERCP) WITH PROPOFOL N/A 11/29/2021   Procedure: ENDOSCOPIC RETROGRADE CHOLANGIOPANCREATOGRAPHY (ERCP) WITH PROPOFOL;  Surgeon: Meridee Score Netty Starring., MD;  Location: [facility] ENDOSCOPY;  Service: Gastroenterology;  Laterality: N/A;   ESOPHAGOGASTRODUODENOSCOPY (EGD) WITH PROPOFOL N/A 06/02/2014   Procedure: ESOPHAGOGASTRODUODENOSCOPY (EGD) WITH PROPOFOL;  Surgeon: Rachael Fee, MD;  Location: [facility];  Service: Endoscopy;  Laterality: N/A;   ESOPHAGOGASTRODUODENOSCOPY (EGD) WITH PROPOFOL N/A 03/19/2021   Procedure: ESOPHAGOGASTRODUODENOSCOPY (EGD) WITH PROPOFOL;  Surgeon: Meridee Score Netty Starring., MD;  Location: [facility];  Service: Gastroenterology;  Laterality: N/A;   ESOPHAGOGASTRODUODENOSCOPY (EGD) WITH PROPOFOL N/A 03/23/2021   Procedure: ESOPHAGOGASTRODUODENOSCOPY (EGD) WITH PROPOFOL;  Surgeon: Meryl Dare, MD;  Location: [facility];  Service: Endoscopy;  Laterality: N/A;   hernia surgery x 3     Bilateral Inguinal, Umbicial   INSERTION OF MESH N/A 03/20/2018   Procedure: INSERTION OF MESH;  Surgeon: Ovidio Kin, MD;  Location: [facility] OR;  Service: General;  Laterality: N/A;   IRRIGATION AND DEBRIDEMENT ABSCESS Left 08/14/2012   Procedure: IRRIGATION AND DEBRIDEMENT LEFT INGUINAL BOIL ;  Surgeon: Kathi Ludwig, MD;  Location: [facility] ORS;  Service: Urology;  Laterality: Left;   JOINT REPLACEMENT Right 2017   KNEE ARTHROPLASTY     LEFT HEART CATH AND CORONARY ANGIOGRAPHY N/A 01/24/2019   Procedure: LEFT HEART CATH AND CORONARY ANGIOGRAPHY;  Surgeon: Swaziland, Peter M, MD;  Location: [facility] INVASIVE CV LAB;  Service: Cardiovascular;  Laterality: N/A;   LEFT HEART CATHETERIZATION WITH  CORONARY ANGIOGRAM N/A 05/26/2014   Procedure: LEFT HEART CATHETERIZATION WITH CORONARY ANGIOGRAM;  Surgeon: Micheline Chapman, MD;  Location: [facility] CATH LAB;  Service: Cardiovascular;  Laterality: N/A;   LUMBAR LAMINECTOMY/DECOMPRESSION MICRODISCECTOMY Left 09/26/2014   Procedure: Lumbar Laminectomy for resection of synovial cyst  Lumbar five- sacral one left;  Surgeon: Donalee Citrin, MD;  Location: [facility] NEURO ORS;  Service: Neurosurgery;  Laterality: Left;   MOLE REMOVAL Left 03/20/2018   Procedure: MOLE REMOVAL;  Surgeon: Ovidio Kin, MD;  Location: [facility] OR;  Service: General;  Laterality: Left;   Oral surg to removed growth from ?sinus  10/2010   ?Dermoid removed by DrRiggs   PACEMAKER INSERTION  04/03/2017   PANCREATIC STENT PLACEMENT  03/19/2021   Procedure: PANCREATIC STENT PLACEMENT;  Surgeon: Lemar Lofty., MD;  Location: [facility];  Service: Gastroenterology;;   POLYPECTOMY     RCA stenting     '05 RCA   REMOVAL OF STONES  11/29/2021   Procedure: REMOVAL OF SLUDGE;  Surgeon: Meridee Score Netty Starring., MD;  Location: [facility] ENDOSCOPY;  Service: Gastroenterology;;   right toe surgery  Right    Cyst    RIGHT/LEFT HEART CATH AND CORONARY ANGIOGRAPHY N/A 01/15/2017   Procedure: RIGHT/LEFT HEART CATH AND CORONARY ANGIOGRAPHY;  Surgeon: Tonny Bollman, MD;  Location: [facility] INVASIVE CV LAB;  Service: Cardiovascular;  Laterality: N/A;   s/p right knee arthroscopy  2005   SPHINCTEROTOMY  03/19/2021   Procedure: SPHINCTEROTOMY;  Surgeon: Mansouraty, Netty Starring., MD;  Location: [facility] ENDOSCOPY;  Service: Gastroenterology;;   Francine Graven REMOVAL  11/29/2021   Procedure: STENT REMOVAL;  Surgeon: Lemar Lofty., MD;  Location: [facility] ENDOSCOPY;  Service: Gastroenterology;;   SUBMUCOSAL LIFTING INJECTION  03/19/2021   Procedure: SUBMUCOSAL LIFTING INJECTION;  Surgeon: Lemar Lofty., MD;  Location: [facility] ENDOSCOPY;  Service: Gastroenterology;;   thumb surgery Right    TOTAL KNEE ARTHROPLASTY Right  03/12/2016   Procedure: RIGHT TOTAL KNEE ARTHROPLASTY;  Surgeon: Durene Romans, MD;  Location: [facility]S;  Service: Orthopedics;  Laterality: Right;   TOTAL KNEE ARTHROPLASTY Left 12/10/2021   Procedure: TOTAL KNEE ARTHROPLASTY;  Surgeon: Ollen Gross, MD;  Location: [facility] ORS;  Service: Orthopedics;  Laterality: Left;   UPPER ESOPHAGEAL ENDOSCOPIC ULTRASOUND (EUS) N/A 03/19/2021   Procedure: UPPER ESOPHAGEAL ENDOSCOPIC ULTRASOUND (EUS);  Surgeon: Lemar Lofty., MD;  Location: [facility] ENDOSCOPY;  Service: Gastroenterology;  Laterality: N/A;   UPPER GASTROINTESTINAL ENDOSCOPY     VENTRAL HERNIA REPAIR N/A 03/20/2018   Procedure: LAPAROSCOPIC VENTRAL INCISIONAL  HERNIA ERAS PATHWAY;  Surgeon: Ovidio Kin, MD;  Location: [facility] OR;  Service: General;  Laterality: N/A;   Social History   Social History Narrative   Not on file   Immunization History  Administered Date(s) Administered   Fluad Quad(high Dose 65+) 02/18/2019, 02/21/2022   Influenza Split 03/21/2011   Influenza Whole 04/06/2009, 04/09/2010   Influenza, High Dose Seasonal PF 04/20/2015, 04/15/2016, 02/21/2017   Influenza,inj,Quad PF,6+ Mos 03/24/2014   Influenza-Unspecified 02/26/2018, 02/18/2019, 02/26/2020   PFIZER(Purple Top)SARS-COV-2 Vaccination 05/27/2019, 06/17/2019, 02/15/2020   Pfizer Covid-19 Vaccine Bivalent Booster 77yrs & up 02/21/2022   Pneumococcal Conjugate-13 11/29/2014   Pneumococcal Polysaccharide-23 06/25/2018   Rsv, Mab, Judi Cong, 0.5 Ml, Neonate To 24 Mos(Beyfortus) 02/21/2022   Tdap 11/29/2014   Unspecified SARS-COV-2 Vaccination 02/03/2022   Zoster Recombinant(Shingrix) 06/07/2018, 09/26/2018   Zoster, Live 05/20/2006     Objective: Vital Signs: BP 126/67 (BP Location: Left Arm, Patient Position: Sitting, Cuff Size: Normal)   Pulse 75   Resp 15   Ht 5\' 7"  (1.702 m)   Wt 182 lb (82.6 kg)   BMI 28.51 kg/m    Physical Exam Vitals and nursing note reviewed.  Constitutional:      Appearance:  He is well-developed.  HENT:     Head: Normocephalic and atraumatic.  Eyes:     Conjunctiva/sclera: Conjunctivae normal.     Pupils: Pupils are equal, round, and reactive to light.  Cardiovascular:     Rate and Rhythm: Normal rate and regular rhythm.     Heart sounds: Normal heart sounds.  Pulmonary:     Effort: Pulmonary effort is normal.     Breath sounds: Normal breath sounds.  Abdominal:     General: Bowel sounds are normal.     Palpations: Abdomen is soft.  Musculoskeletal:     Cervical back: Normal range of motion and neck supple.  Skin:    General: Skin is warm and dry.     Capillary Refill: Capillary refill takes less than 2 seconds.  Neurological:     Mental Status: He is alert and oriented to person, place, and time.  Psychiatric:        Behavior: Behavior normal.      Musculoskeletal Exam: He had limited lateral rotation of the cervical spine with some discomfort.  He had no tenderness over thoracic spine.  He had limited painful range of motion of the lumbar spine.  Shoulders, elbows, wrist joints were in good range of motion.  He had thickening of MCP joints, PIP and DIP joints with no synovitis.  Hip joints and knee joints in good range of motion.  Bilateral knee joints were replaced and warm to touch.  There was no tenderness over ankles or MTPs.  CDAI Exam: CDAI Score: -- Patient Global: 10 / 100; Provider Global: 10 / 100 Swollen: --; Tender: -- Joint Exam 01/28/2023   No joint exam has been documented for this visit   There is currently no information documented on the homunculus. Go to the Rheumatology activity and complete the homunculus joint exam.  Investigation: No additional findings.  Imaging: No results found.  Recent Labs: Lab Results  Component Value Date   WBC 6.3 12/10/2022   HGB 14.7 12/10/2022   PLT 274.0 12/10/2022   NA 138 12/10/2022   K 4.2 12/10/2022   CL 105 12/10/2022   CO2 28 12/10/2022   GLUCOSE 105 (H) 12/10/2022   BUN  13 12/10/2022   CREATININE 0.87 12/10/2022   BILITOT 1.2 12/10/2022   ALKPHOS 67 12/10/2022   AST 20 12/10/2022   ALT 15 12/10/2022   PROT 6.9 12/10/2022   ALBUMIN 4.4 12/10/2022   CALCIUM 9.9 12/10/2022   GFRAA 100 11/09/2020    Speciality Comments: PLQ eye exam: 10/08/2022 WNL [facility] Follow up 1 year.  Procedures:  No procedures performed Allergies: Sulfonamide derivatives   Assessment / Plan:     Visit Diagnoses: Seropositive rheumatoid arthritis (HCC) RF-, anti-CCP+-patient denies having a flare of rheumatoid arthritis.  No synovitis was noted on the examination today.  He had bilateral MCP PIP and DIP thickening.  Joint protection and muscle strengthening was discussed.  High risk medication use - Plaquenil 200 mg p.o. daily Monday to Friday.  PLQ eye exam: 10/08/2022.  Labs from July 2024 were reviewed which were within normal limits.  Information on immunization was placed in the AVS.  Medication monitoring encounter -he continues to have generalized pain and discomfort multiple joints and also lower back.  He has been taking tramadol 50 mg p.o. p.o. nightly as needed pain. UDS & narcotic agreement: Was renewed today- Plan: DRUG MONITOR, TRAMADOL,QN, URINE, DRUG MONITOR, PANEL 5, W/CONF, URINE  Primary osteoarthritis of both hands-he had bilateral PIP and DIP thickening.  No synovitis was noted.  Joint protection was discussed.  Status post bilateral knee replacements - LTR July 2023.  He continues to have pain and discomfort in his bilateral knee joints and difficulty climbing stairs.  Lower extremity muscle strengthening exercises were discussed.  Primary osteoarthritis of both feet -he has off-and-on discomfort in his feet.  No synovitis was noted.  Followed by Dr. Victorino Dike.  According the patient he suggested  first MTP surgery.  Chronic left SI joint pain-he continues to have some left SI joint discomfort.  Trochanteric bursitis, left hip-IT band stretching  exercises which is demonstrated.  DDD (degenerative disc disease), lumbar-he has lower back pain off and on.  He had tenderness over the lower lumbar region.  Osteopenia of multiple sites - May 02, 2021 DEXA scan showed T-score of -1.6 BMD 0.706 in the left femoral neck.  Calcium rich diet with vitamin D was discussed.  Regular exercise was emphasized.  History of coronary artery disease  History of hypertension-blood pressure is normal today at 126/67.  History of CHF (congestive heart failure)  Pacemaker  History of hyperlipidemia  History of gastroesophageal reflux (GERD)  History of gastric polyp  History of colon polyps  History of sleep apnea  History of BPH  Orders: Orders Placed This Encounter  Procedures   DRUG MONITOR, TRAMADOL,QN, URINE   DRUG MONITOR, PANEL 5, W/CONF, URINE   No orders of the defined types were placed in this encounter.   Follow-Up Instructions: Return in about 5 months (around 06/30/2023) for Rheumatoid arthritis, Osteoarthritis.   Pollyann Savoy, MD  Note - This record has been created using Animal nutritionist.  Chart creation errors have been sought, but may not always  have been located. Such creation errors do not reflect on  the standard of medical care.

## 2023-01-15 NOTE — Telephone Encounter (Signed)
Last Fill: 05/01/2022  Eye exam:  10/08/2022 WNL   Labs: 12/10/2022 Glucose 105  Next Visit: 01/28/2023  Last Visit: 07/23/2022  ZD:[id number] rheumatoid arthritis   Current Dose per office note 07/23/2022: Plaquenil 200 mg p.o. twice daily Monday to Friday.   Okay to refill Plaquenil?

## 2023-01-23 ENCOUNTER — Other Ambulatory Visit: Payer: Self-pay | Admitting: Rheumatology

## 2023-01-23 NOTE — Telephone Encounter (Signed)
Last Fill: 12/18/2022   UDS:07/23/2022 UDS is consistent with tramadol use.    Narc Agreement: 07/23/2022    Next Visit: 01/28/2023   Last Visit: 07/23/2022   Dx: Seropositive rheumatoid arthritis    Current Dose per office note on 07/23/2022: ramadol 50 mg p.o. p.o. nightly as needed pain.    Okay to refill Tramadol?

## 2023-01-28 ENCOUNTER — Ambulatory Visit: Payer: Medicare Other | Attending: Rheumatology | Admitting: Rheumatology

## 2023-01-28 ENCOUNTER — Encounter: Payer: Self-pay | Admitting: Rheumatology

## 2023-01-28 VITALS — BP 126/67 | HR 75 | Resp 15 | Ht 67.0 in | Wt 182.0 lb

## 2023-01-28 DIAGNOSIS — M8589 Other specified disorders of bone density and structure, multiple sites: Secondary | ICD-10-CM | POA: Diagnosis not present

## 2023-01-28 DIAGNOSIS — M533 Sacrococcygeal disorders, not elsewhere classified: Secondary | ICD-10-CM

## 2023-01-28 DIAGNOSIS — M19041 Primary osteoarthritis, right hand: Secondary | ICD-10-CM | POA: Diagnosis not present

## 2023-01-28 DIAGNOSIS — Z79899 Other long term (current) drug therapy: Secondary | ICD-10-CM | POA: Diagnosis not present

## 2023-01-28 DIAGNOSIS — Z8719 Personal history of other diseases of the digestive system: Secondary | ICD-10-CM

## 2023-01-28 DIAGNOSIS — M5136 Other intervertebral disc degeneration, lumbar region: Secondary | ICD-10-CM | POA: Diagnosis not present

## 2023-01-28 DIAGNOSIS — M059 Rheumatoid arthritis with rheumatoid factor, unspecified: Secondary | ICD-10-CM

## 2023-01-28 DIAGNOSIS — Z8639 Personal history of other endocrine, nutritional and metabolic disease: Secondary | ICD-10-CM

## 2023-01-28 DIAGNOSIS — G8929 Other chronic pain: Secondary | ICD-10-CM

## 2023-01-28 DIAGNOSIS — Z5181 Encounter for therapeutic drug level monitoring: Secondary | ICD-10-CM | POA: Diagnosis not present

## 2023-01-28 DIAGNOSIS — Z950 Presence of cardiac pacemaker: Secondary | ICD-10-CM

## 2023-01-28 DIAGNOSIS — M19072 Primary osteoarthritis, left ankle and foot: Secondary | ICD-10-CM

## 2023-01-28 DIAGNOSIS — Z8679 Personal history of other diseases of the circulatory system: Secondary | ICD-10-CM | POA: Diagnosis not present

## 2023-01-28 DIAGNOSIS — Z96653 Presence of artificial knee joint, bilateral: Secondary | ICD-10-CM | POA: Diagnosis not present

## 2023-01-28 DIAGNOSIS — Z87438 Personal history of other diseases of male genital organs: Secondary | ICD-10-CM

## 2023-01-28 DIAGNOSIS — M19071 Primary osteoarthritis, right ankle and foot: Secondary | ICD-10-CM

## 2023-01-28 DIAGNOSIS — M7062 Trochanteric bursitis, left hip: Secondary | ICD-10-CM

## 2023-01-28 DIAGNOSIS — M19042 Primary osteoarthritis, left hand: Secondary | ICD-10-CM

## 2023-01-28 DIAGNOSIS — Z86010 Personal history of colonic polyps: Secondary | ICD-10-CM

## 2023-01-28 DIAGNOSIS — Z8669 Personal history of other diseases of the nervous system and sense organs: Secondary | ICD-10-CM

## 2023-01-28 NOTE — Patient Instructions (Signed)
Standing Labs We placed an order today for your standing lab work.   Please have your standing labs drawn in December  Please have your labs drawn 2 weeks prior to your appointment so that the provider can discuss your lab results at your appointment, if possible.  Please note that you may see your imaging and lab results in MyChart before we have reviewed them. We will contact you once all results are reviewed. Please allow our office up to 72 hours to thoroughly review all of the results before contacting the office for clarification of your results.  WALK-IN LAB HOURS  Monday through Thursday from 8:00 am -12:30 pm and 1:00 pm-5:00 pm and Friday from 8:00 am-12:00 pm.  Patients with office visits requiring labs will be seen before walk-in labs.  You may encounter longer than normal wait times. Please allow additional time. Wait times may be shorter on  Monday and Thursday afternoons.  We do not book appointments for walk-in labs. We appreciate your patience and understanding with our staff.   Labs are drawn by [facility]. Please bring your co-pay at the time of your lab draw.  You may receive a bill from [facility] for your lab work.  Please note if you are on Hydroxychloroquine and and an order has been placed for a Hydroxychloroquine level,  you will need to have it drawn 4 hours or more after your last dose.  If you wish to have your labs drawn at another location, please call the office 24 hours in advance so we can fax the orders.  The office is located at [address], [place], [place] [address]   If you have any questions regarding directions or hours of operation,  please call [phone].   As a reminder, please drink plenty of water prior to coming for your lab work. Thanks!

## 2023-01-30 ENCOUNTER — Ambulatory Visit (INDEPENDENT_AMBULATORY_CARE_PROVIDER_SITE_OTHER): Payer: Medicare Other

## 2023-01-30 DIAGNOSIS — I428 Other cardiomyopathies: Secondary | ICD-10-CM | POA: Diagnosis not present

## 2023-01-30 LAB — CUP PACEART REMOTE DEVICE CHECK
Battery Remaining Longevity: 10 mo
Battery Voltage: 2.87 V
Brady Statistic AP VP Percent: 1.55 %
Brady Statistic AP VS Percent: 0.03 %
Brady Statistic AS VP Percent: 90.68 %
Brady Statistic AS VS Percent: 7.73 %
Brady Statistic RA Percent Paced: 1.58 %
Brady Statistic RV Percent Paced: 35.47 %
Date Time Interrogation Session: 20240912001703
HighPow Impedance: 64 Ohm
Implantable Lead Connection Status: 753985
Implantable Lead Connection Status: 753985
Implantable Lead Connection Status: 753985
Implantable Lead Implant Date: 20181108
Implantable Lead Implant Date: 20181108
Implantable Lead Implant Date: 20181108
Implantable Lead Location: 753858
Implantable Lead Location: 753859
Implantable Lead Location: 753860
Implantable Lead Model: 4298
Implantable Lead Model: 5076
Implantable Pulse Generator Implant Date: 20181108
Lead Channel Impedance Value: 1026 Ohm
Lead Channel Impedance Value: 1026 Ohm
Lead Channel Impedance Value: 1083 Ohm
Lead Channel Impedance Value: 189.525
Lead Channel Impedance Value: 211.891
Lead Channel Impedance Value: 240.667
Lead Channel Impedance Value: 256.983
Lead Channel Impedance Value: 299.908
Lead Channel Impedance Value: 361 Ohm
Lead Channel Impedance Value: 399 Ohm
Lead Channel Impedance Value: 399 Ohm
Lead Channel Impedance Value: 475 Ohm
Lead Channel Impedance Value: 513 Ohm
Lead Channel Impedance Value: 551 Ohm
Lead Channel Impedance Value: 608 Ohm
Lead Channel Impedance Value: 703 Ohm
Lead Channel Impedance Value: 722 Ohm
Lead Channel Impedance Value: 779 Ohm
Lead Channel Pacing Threshold Amplitude: 0.5 V
Lead Channel Pacing Threshold Amplitude: 0.5 V
Lead Channel Pacing Threshold Amplitude: 2.375 V
Lead Channel Pacing Threshold Pulse Width: 0.4 ms
Lead Channel Pacing Threshold Pulse Width: 0.4 ms
Lead Channel Pacing Threshold Pulse Width: 1 ms
Lead Channel Sensing Intrinsic Amplitude: 12.25 mV
Lead Channel Sensing Intrinsic Amplitude: 12.25 mV
Lead Channel Sensing Intrinsic Amplitude: 2.875 mV
Lead Channel Sensing Intrinsic Amplitude: 2.875 mV
Lead Channel Setting Pacing Amplitude: 2 V
Lead Channel Setting Pacing Amplitude: 2.5 V
Lead Channel Setting Pacing Amplitude: 3 V
Lead Channel Setting Pacing Pulse Width: 0.4 ms
Lead Channel Setting Pacing Pulse Width: 1 ms
Lead Channel Setting Sensing Sensitivity: 0.3 mV
Zone Setting Status: 755011
Zone Setting Status: 755011

## 2023-01-31 LAB — DRUG MONITOR, PANEL 5, W/CONF, URINE
Amphetamines: NEGATIVE ng/mL (ref <500)
Barbiturates: NEGATIVE ng/mL (ref <300)
Benzodiazepines: NEGATIVE ng/mL (ref <100)
Cocaine Metabolite: NEGATIVE ng/mL (ref <150)
Creatinine: 33.5 mg/dL (ref >=20.0)
Marijuana Metabolite: NEGATIVE ng/mL (ref <20)
Methadone Metabolite: NEGATIVE ng/mL (ref <100)
Opiates: NEGATIVE ng/mL (ref <100)
Oxidant: NEGATIVE ug/mL (ref <200)
Oxycodone: NEGATIVE ng/mL (ref <100)
pH: 7 (ref 4.5–9.0)

## 2023-01-31 LAB — DM TEMPLATE

## 2023-01-31 LAB — DRUG MONITOR, TRAMADOL,QN, URINE
Desmethyltramadol: 569 ng/mL — ABNORMAL HIGH (ref <100)
Tramadol: 484 ng/mL — ABNORMAL HIGH (ref <100)

## 2023-01-31 NOTE — Progress Notes (Signed)
UDS consistent with tramadol use.

## 2023-02-10 ENCOUNTER — Ambulatory Visit: Payer: BLUE CROSS/BLUE SHIELD | Attending: Cardiology

## 2023-02-10 DIAGNOSIS — Z95810 Presence of automatic (implantable) cardiac defibrillator: Secondary | ICD-10-CM

## 2023-02-10 DIAGNOSIS — I5022 Chronic systolic (congestive) heart failure: Secondary | ICD-10-CM

## 2023-02-11 NOTE — Progress Notes (Signed)
Remote ICD transmission.   

## 2023-02-13 NOTE — Progress Notes (Signed)
EPIC Encounter for ICM Monitoring  Patient Name: [name] is a 77 y.o. male Date: 02/13/2023 Primary Care Physican: Corwin Levins, MD Primary Cardiologist: Excell Seltzer Electrophysiologist: Elberta Fortis Bi-V Pacing:   92.0%   07/24/2022 Office Weight: 189 lbs 02/13/2023 Weight: 175 lbs   Time in AT/AF  <0.1 hr/day (<0.1%)                                                            Spoke with patient and heart failure questions reviewed.  Transmission results reviewed.  Pt asymptomatic for fluid accumulation.  Reports feeling well at this time and voices no complaints.   Walking 2 to 4 miles a day   Diet:  Does eat restaurant foods.    Optivol thoracic impedance suggesting normal fluid levels with the exception of possible fluid accumulation from 8/18-9/18.   Prescribed: Spironolactone 25 mg take 0.5 tablet daily.   Labs: 12/10/2022 Creatinine 0.87, BUN 13, Potassium 4.2, Sodium 138, GFR 82.50  A complete set of results can be found in Results Review.   Recommendations:  Discussed limiting salt and fluid intake.  No changes and encouraged to call if experiencing any fluid symptoms.   Follow-up plan: ICM clinic phone appointment on 03/17/2023.   91 day device clinic remote transmission 05/01/2023.       EP/Cardiology Office Visits:   Recall 06/07/2023 with Dr Excell Seltzer.    Recall 07/19/2023 with Francis Dowse, PA   Copy of ICM check sent to Dr. Elberta Fortis.     3 month ICM trend: 02/10/2023.    12-14 Month ICM trend:     Karie Soda, RN 02/13/2023 12:31 PM

## 2023-02-20 DIAGNOSIS — D2372 Other benign neoplasm of skin of left lower limb, including hip: Secondary | ICD-10-CM | POA: Diagnosis not present

## 2023-02-20 DIAGNOSIS — D485 Neoplasm of uncertain behavior of skin: Secondary | ICD-10-CM | POA: Diagnosis not present

## 2023-02-20 DIAGNOSIS — D225 Melanocytic nevi of trunk: Secondary | ICD-10-CM | POA: Diagnosis not present

## 2023-02-20 DIAGNOSIS — L299 Pruritus, unspecified: Secondary | ICD-10-CM | POA: Diagnosis not present

## 2023-02-20 DIAGNOSIS — L820 Inflamed seborrheic keratosis: Secondary | ICD-10-CM | POA: Diagnosis not present

## 2023-02-20 DIAGNOSIS — L218 Other seborrheic dermatitis: Secondary | ICD-10-CM | POA: Diagnosis not present

## 2023-02-20 DIAGNOSIS — L812 Freckles: Secondary | ICD-10-CM | POA: Diagnosis not present

## 2023-02-20 DIAGNOSIS — L821 Other seborrheic keratosis: Secondary | ICD-10-CM | POA: Diagnosis not present

## 2023-02-24 ENCOUNTER — Encounter (HOSPITAL_COMMUNITY): Payer: Self-pay

## 2023-02-24 ENCOUNTER — Ambulatory Visit (HOSPITAL_COMMUNITY): Admit: 2023-02-24 | Payer: Medicare Other | Admitting: Gastroenterology

## 2023-02-24 SURGERY — ESOPHAGOGASTRODUODENOSCOPY (EGD) WITH PROPOFOL
Anesthesia: Monitor Anesthesia Care

## 2023-02-26 ENCOUNTER — Other Ambulatory Visit: Payer: Self-pay | Admitting: Internal Medicine

## 2023-02-27 ENCOUNTER — Ambulatory Visit: Payer: Medicare Other

## 2023-02-27 VITALS — Ht 67.0 in | Wt 180.0 lb

## 2023-02-27 DIAGNOSIS — K862 Cyst of pancreas: Secondary | ICD-10-CM

## 2023-02-27 DIAGNOSIS — D135 Benign neoplasm of extrahepatic bile ducts: Secondary | ICD-10-CM

## 2023-02-27 NOTE — Telephone Encounter (Signed)
Last Fill: 01/23/2023  UDS:01/28/2023 UDS consistent with tramadol use.   Narc Agreement: 01/28/2023  Next Visit: 07/01/2023  Last Visit: 01/28/2023  Dx: Seropositive rheumatoid arthritis   Current Dose per office note on 01/28/2023:tramadol 50 mg p.o. p.o. nightly as needed pain    Okay to refill Tramadol?

## 2023-02-27 NOTE — Progress Notes (Signed)
No egg or soy allergy known to patient  No issues known to pt with past sedation with any surgeries or procedures Patient denies ever being told they had issues or difficulty with intubation  No FH of Malignant Hyperthermia Pt is not on diet pills Pt is not on  home 02  Pt is not on blood thinners  Pt denies issues with constipation  Has A fib  No A flutter Have any cardiac testing pending--no Pt can ambulate independently Pt denies use of chewing tobacco Discussed diabetic I weight loss medication holds Discussed NSAID holds Checked BMI Pt instructed to use Singlecare[employer]com or GoodRx for a price reduction on prep  Patient's chart reviewed by Cathlyn Parsons CNRA prior to previsit and patient appropriate for the [facility].  Pre visit completed and red dot placed by patient's name on their procedure day (on provider's schedule).

## 2023-03-04 DIAGNOSIS — E291 Testicular hypofunction: Secondary | ICD-10-CM | POA: Diagnosis not present

## 2023-03-04 DIAGNOSIS — R948 Abnormal results of function studies of other organs and systems: Secondary | ICD-10-CM | POA: Diagnosis not present

## 2023-03-06 ENCOUNTER — Other Ambulatory Visit: Payer: Self-pay | Admitting: Cardiology

## 2023-03-06 DIAGNOSIS — I480 Paroxysmal atrial fibrillation: Secondary | ICD-10-CM

## 2023-03-06 NOTE — Telephone Encounter (Signed)
Eliquis 5mg  refill request received. Patient is 77 years old, weight-81.6kg, Crea-0.87 on 12/10/22, Diagnosis-Afib, and last seen by Dr. Excell Seltzer on 12/09/22. Dose is appropriate based on dosing criteria. Will send in refill to requested pharmacy.

## 2023-03-07 ENCOUNTER — Encounter (HOSPITAL_COMMUNITY): Payer: Self-pay | Admitting: Gastroenterology

## 2023-03-11 DIAGNOSIS — N401 Enlarged prostate with lower urinary tract symptoms: Secondary | ICD-10-CM | POA: Diagnosis not present

## 2023-03-11 DIAGNOSIS — R3915 Urgency of urination: Secondary | ICD-10-CM | POA: Diagnosis not present

## 2023-03-11 DIAGNOSIS — E291 Testicular hypofunction: Secondary | ICD-10-CM | POA: Diagnosis not present

## 2023-03-12 NOTE — Anesthesia Preprocedure Evaluation (Signed)
Anesthesia Evaluation  Patient identified by MRN, date of birth, ID band Patient awake    Reviewed: Allergy & Precautions, NPO status , Patient's Chart, lab work & pertinent test results  History of Anesthesia Complications Negative for: history of anesthetic complications  Airway Mallampati: III  TM Distance: >3 FB Neck ROM: Full    Dental  (+) Dental Advisory Given   Pulmonary sleep apnea and Continuous Positive Airway Pressure Ventilation , former smoker   Pulmonary exam normal        Cardiovascular hypertension, Pt. on medications + CAD, + Cardiac Stents and +CHF  Normal cardiovascular exam+ Cardiac Defibrillator    '24 Myoperfusion - The study shows normal perfusion. The study is intermediate risk due to mildly reduced EF on study (appears globally hypokinetic and worse in the inferior segments; does not appear to be related to V-pacing). Recommend correlation with echo for better assessment.   No ST deviation was noted.   LV perfusion is normal. There is no evidence of ischemia.   Left ventricular function is abnormal. Nuclear stress EF: 43%. The left ventricular ejection fraction is moderately decreased (30-44%). End diastolic cavity size is normal. End systolic cavity size is normal.   Prior study not available for comparison.  '24 TTE - EF 50 to 55%. There is moderate concentric left ventricular hypertrophy. Grade I diastolic dysfunction (impaired relaxation). Left atrial size was moderately dilated. Trivial mitral valve regurgitation. Aortic valve regurgitation is trivial.     Neuro/Psych negative neurological ROS  negative psych ROS   GI/Hepatic Neg liver ROS, hiatal hernia,GERD  Controlled,,  Endo/Other   Pre-DM   Renal/GU negative Renal ROS     Musculoskeletal  (+) Arthritis , Osteoarthritis,    Abdominal   Peds  Hematology negative hematology ROS (+)   Anesthesia Other Findings Chronic pain    Reproductive/Obstetrics                             Anesthesia Physical Anesthesia Plan  ASA: 3  Anesthesia Plan: MAC   Post-op Pain Management: Minimal or no pain anticipated   Induction:   PONV Risk Score and Plan: 1 and Propofol infusion and Treatment may vary due to age or medical condition  Airway Management Planned: Nasal Cannula and Natural Airway  Additional Equipment: None  Intra-op Plan:   Post-operative Plan:   Informed Consent: I have reviewed the patients History and Physical, chart, labs and discussed the procedure including the risks, benefits and alternatives for the proposed anesthesia with the patient or authorized representative who has indicated his/her understanding and acceptance.       Plan Discussed with: CRNA and Anesthesiologist  Anesthesia Plan Comments:         Anesthesia Quick Evaluation

## 2023-03-13 ENCOUNTER — Encounter (HOSPITAL_COMMUNITY)
Admission: RE | Disposition: A | Discharge status: Home / Self Care | Payer: Self-pay | Source: Home / Self Care | Attending: Gastroenterology

## 2023-03-13 ENCOUNTER — Ambulatory Visit (HOSPITAL_COMMUNITY): Payer: Self-pay | Admitting: Anesthesiology

## 2023-03-13 ENCOUNTER — Telehealth: Payer: Self-pay

## 2023-03-13 ENCOUNTER — Encounter (HOSPITAL_COMMUNITY): Payer: Self-pay | Admitting: Gastroenterology

## 2023-03-13 ENCOUNTER — Other Ambulatory Visit: Payer: Self-pay

## 2023-03-13 ENCOUNTER — Ambulatory Visit (HOSPITAL_COMMUNITY)
Admission: RE | Admit: 2023-03-13 | Discharge: 2023-03-13 | Disposition: A | Discharge status: Home / Self Care | Payer: Medicare Other | Attending: Gastroenterology | Admitting: Gastroenterology

## 2023-03-13 DIAGNOSIS — K862 Cyst of pancreas: Secondary | ICD-10-CM

## 2023-03-13 DIAGNOSIS — K2289 Other specified disease of esophagus: Secondary | ICD-10-CM | POA: Diagnosis not present

## 2023-03-13 DIAGNOSIS — Z09 Encounter for follow-up examination after completed treatment for conditions other than malignant neoplasm: Secondary | ICD-10-CM | POA: Diagnosis present

## 2023-03-13 DIAGNOSIS — K317 Polyp of stomach and duodenum: Secondary | ICD-10-CM

## 2023-03-13 DIAGNOSIS — I110 Hypertensive heart disease with heart failure: Secondary | ICD-10-CM | POA: Diagnosis not present

## 2023-03-13 DIAGNOSIS — Z7901 Long term (current) use of anticoagulants: Secondary | ICD-10-CM

## 2023-03-13 DIAGNOSIS — D135 Benign neoplasm of extrahepatic bile ducts: Secondary | ICD-10-CM

## 2023-03-13 DIAGNOSIS — I2510 Atherosclerotic heart disease of native coronary artery without angina pectoris: Secondary | ICD-10-CM | POA: Diagnosis not present

## 2023-03-13 DIAGNOSIS — Z87891 Personal history of nicotine dependence: Secondary | ICD-10-CM | POA: Diagnosis not present

## 2023-03-13 DIAGNOSIS — R7303 Prediabetes: Secondary | ICD-10-CM | POA: Insufficient documentation

## 2023-03-13 DIAGNOSIS — I509 Heart failure, unspecified: Secondary | ICD-10-CM | POA: Diagnosis not present

## 2023-03-13 DIAGNOSIS — K449 Diaphragmatic hernia without obstruction or gangrene: Secondary | ICD-10-CM | POA: Insufficient documentation

## 2023-03-13 DIAGNOSIS — C241 Malignant neoplasm of ampulla of Vater: Secondary | ICD-10-CM | POA: Diagnosis not present

## 2023-03-13 DIAGNOSIS — D132 Benign neoplasm of duodenum: Secondary | ICD-10-CM

## 2023-03-13 DIAGNOSIS — R1900 Intra-abdominal and pelvic swelling, mass and lump, unspecified site: Secondary | ICD-10-CM

## 2023-03-13 HISTORY — PX: ESOPHAGOGASTRODUODENOSCOPY (EGD) WITH PROPOFOL: SHX5813

## 2023-03-13 SURGERY — ESOPHAGOGASTRODUODENOSCOPY (EGD) WITH PROPOFOL
Anesthesia: Monitor Anesthesia Care

## 2023-03-13 MED ORDER — PROPOFOL 1000 MG/100ML IV EMUL
INTRAVENOUS | Status: AC
Start: 2023-03-13 — End: ?
  Filled 2023-03-13: qty 400

## 2023-03-13 MED ORDER — PROPOFOL 10 MG/ML IV BOLUS
INTRAVENOUS | Status: DC | PRN
  Administered 2023-03-13: 20 mg via INTRAVENOUS
  Administered 2023-03-13: 10 mg via INTRAVENOUS
  Administered 2023-03-13: 20 mg via INTRAVENOUS

## 2023-03-13 MED ORDER — PROPOFOL 500 MG/50ML IV EMUL
INTRAVENOUS | Status: AC
  Filled 2023-03-13: qty 100

## 2023-03-13 MED ORDER — SODIUM CHLORIDE 0.9 % IV SOLN
INTRAVENOUS | Status: DC
Start: 2023-03-13 — End: 2023-03-13

## 2023-03-13 MED ORDER — LIDOCAINE 2% (20 MG/ML) 5 ML SYRINGE
INTRAMUSCULAR | Status: DC | PRN
  Administered 2023-03-13: 100 mg via INTRAVENOUS

## 2023-03-13 MED ORDER — PROPOFOL 500 MG/50ML IV EMUL
INTRAVENOUS | Status: DC | PRN
  Administered 2023-03-13: 130 ug/kg/min via INTRAVENOUS

## 2023-03-13 SURGICAL SUPPLY — 15 items

## 2023-03-13 NOTE — Telephone Encounter (Signed)
Lab order has been entered and recall endo colon entered for 2 years

## 2023-03-13 NOTE — Transfer of Care (Signed)
Immediate Anesthesia Transfer of Care Note  Patient: [name]  Procedure(s) Performed: ESOPHAGOGASTRODUODENOSCOPY (EGD) WITH PROPOFOL  Patient Location: PACU  Anesthesia Type:MAC  Level of Consciousness: sedated  Airway & Oxygen Therapy: Patient Spontanous Breathing and Patient connected to face mask oxygen  Post-op Assessment: Report given to RN and Post -op Vital signs reviewed and stable  Post vital signs: Reviewed and stable  Last Vitals:  Vitals Value Taken Time  BP    Temp    Pulse    Resp    SpO2      Last Pain:  Vitals:   03/13/23 0701  TempSrc: Tympanic  PainSc: 0-No pain         Complications: No notable events documented.

## 2023-03-13 NOTE — Plan of Care (Signed)
[facility] Tonsillectomy/Adenoidectomy, Postoperative PEDS care plan entered in error.

## 2023-03-13 NOTE — Op Note (Signed)
[facility] Patient Name: [name] Procedure Date: 03/13/2023 MRN: [id number] Attending MD: Corliss Parish , MD, [id number] Date of Birth: 12/30/1945 CSN: [id number] Age: 77 Admit Type: Outpatient Procedure:                Upper GI endoscopy Indications:              Follow-up of polyps in the duodenum Providers:                Corliss Parish, MD, Vita Erm, RN, Harrington Challenger, Technician Referring MD:             Quentin Mulling, Corwin Levins, MD Medicines:                Monitored Anesthesia Care Complications:            No immediate complications. Estimated Blood Loss:     Estimated blood loss: none. Procedure:                Pre-Anesthesia Assessment:                           - Prior to the procedure, a History and Physical                            was performed, and patient medications and                            allergies were reviewed. The patient's tolerance of                            previous anesthesia was also reviewed. The risks                            and benefits of the procedure and the sedation                            options and risks were discussed with the patient.                            All questions were answered, and informed consent                            was obtained. Prior Anticoagulants: The patient has                            taken Eliquis (apixaban), last dose was 2 days                            prior to procedure. ASA Grade Assessment: III - A                            patient with severe systemic disease. After  reviewing the risks and benefits, the patient was                            deemed in satisfactory condition to undergo the                            procedure.                           After obtaining informed consent, the endoscope was                            passed under direct vision. Throughout the                             procedure, the patient's blood pressure, pulse, and                            oxygen saturations were monitored continuously. The                            [id number]-[id number] ([id number]) Olympus endoscope was introduced                            through the mouth, and advanced to the second part                            of duodenum. The [id number]-[id number] ([id number]) Olympus                            duodenoscope was introduced through the mouth, and                            advanced to the area of papilla. The upper GI                            endoscopy was accomplished without difficulty. The                            patient tolerated the procedure. Scope In: Scope Out: Findings:      No gross lesions were noted in the entire esophagus.      The Z-line was irregular and was found 39 cm from the incisors.      A 1 cm hiatal hernia was present.      Multiple 5 to 15 mm semi-sessile and semi-pedunculated polyps with no       bleeding and no stigmata of recent bleeding were found in the gastric       fundus and in the gastric body.      No other gross lesions were noted in the entire examined stomach.      No gross lesions were noted in the duodenal bulb, in the first portion       of the duodenum and in the second portion of the duodenum.      There was evidence of a patent biliary sphincterotomy and pancreatic  sphincterotomy in the major papilla. This was characterized by healthy       appearing mucosa. Impression:               - No gross lesions in the entire esophagus. Z-line                            irregular, 39 cm from the incisors.                           - 1 cm hiatal hernia.                           - Multiple gastric polyps. No other gross lesions                            in the entire stomach.                           - No gross lesions in the duodenal bulb, in the                            first portion of the duodenum and in the second                             portion of the duodenum.                           - Patent biliary sphincterotomy and pancreatic                            sphincterotomy, characterized by healthy appearing                            mucosa was found. Moderate Sedation:      Not Applicable - Patient had care per Anesthesia. Recommendation:           - The patient will be observed post-procedure,                            until all discharge criteria are met.                           - Discharge patient to home.                           - Patient has a contact number available for                            emergencies. The signs and symptoms of potential                            delayed complications were discussed with the                            patient. Return to normal activities tomorrow.  Written discharge instructions were provided to the                            patient.                           - Resume previous diet.                           - Continue present medications.                           - Repeat upper endoscopy in 2 years for                            surveillance would be normal recommendation.                           - Colonoscopy based on last report in 2016                            suggested a 10-year followup. EGD could be done at                            same time as Colonoscopy if desired (though would                            need to discuss things with him as he will be 77                            years of age at that time).                           - Continue present medications.                           - Some of the polyps were inflammatory in                            appearance, but he has no evidence of anemia or                            iron deficiency. I did not remove them at this                            time. However, if we want to get updated blood                            counts and if anemia is found and he has Iron                             deficiency, we could consider the role of trying to  remove some of these and an earlier colonoscopy                            evaluation.                           - The findings and recommendations were discussed                            with the patient.                           - The findings and recommendations were discussed                            with the patient's family. Procedure Code(s):        --- Professional ---                           [phone], Esophagogastroduodenoscopy, flexible,                            transoral; diagnostic, including collection of                            specimen(s) by brushing or washing, when performed                            (separate procedure) Diagnosis Code(s):        --- Professional ---                           K22.89, Other specified disease of esophagus                           K44.9, Diaphragmatic hernia without obstruction or                            gangrene                           K31.7, Polyp of stomach and duodenum                           Z98.890, Other specified postprocedural states CPT copyright 2022 [employer]. All rights reserved. The codes documented in this report are preliminary and upon coder review may  be revised to meet current compliance requirements. Corliss Parish, MD 03/13/2023 8:25:18 AM Number of Addenda: 0

## 2023-03-13 NOTE — Discharge Instructions (Signed)

## 2023-03-13 NOTE — Anesthesia Postprocedure Evaluation (Signed)
Anesthesia Post Note  Patient: [name]  Procedure(s) Performed: ESOPHAGOGASTRODUODENOSCOPY (EGD) WITH PROPOFOL     Patient location during evaluation: PACU Anesthesia Type: MAC Level of consciousness: awake and alert Pain management: pain level controlled Vital Signs Assessment: post-procedure vital signs reviewed and stable Respiratory status: spontaneous breathing, nonlabored ventilation and respiratory function stable Cardiovascular status: stable and blood pressure returned to baseline Anesthetic complications: no   No notable events documented.  Last Vitals:  Vitals:   03/13/23 0840 03/13/23 0842  BP: 131/64 131/64  Pulse: 74 71  Resp: 16 18  Temp:    SpO2: 93% 95%    Last Pain:  Vitals:   03/13/23 0842  TempSrc:   PainSc: 0-No pain                 Beryle Lathe

## 2023-03-13 NOTE — Telephone Encounter (Signed)
-----   Message from [facility] sent at 03/13/2023  8:39 AM EDT ----- Regarding: Labwork for next week [name], Please have a CBC/Iron/TIBC/Ferritin lab set up in the system for future labs. Place a 2-year EGD/Colonoscopy recall in the system under my name. Plan will be for him to come in next week and check his labs. If ID is found then will need repeat EGD for polyp removal and Colonoscopy sooner. Thanks. GM

## 2023-03-13 NOTE — H&P (Signed)
GASTROENTEROLOGY PROCEDURE H&P NOTE   Primary Care Physician: Corwin Levins, MD  HPI: [name] is a 77 y.o. male who presents for EGD for followup of previous ampullectomy endoscopically.  Past Medical History:  Diagnosis Date   AICD (automatic cardioverter/defibrillator) present    Allergic rhinitis    Allergy    Anginal pain (HCC) 05/26/14   chest pain after chasing dog   Atypical nevus of back 04/27/2003   moderate - mid lower back   Basal cell carcinoma 01/26/2008   right cheek - MOHs   CAD (coronary artery disease)    hx of stent- 2005 RCA   Cataract    beginning stage both eyes   CHF (congestive heart failure) (HCC)    pacemaker Medtronic     Chronic back pain    intermittent   Constipation    Dizziness    Dysrhythmia    right bundle branch block    Esophageal stricture    GERD (gastroesophageal reflux disease)    Hemoptysis    Hiatal hernia    History of colonic polyps    hyperplastic   HTN (hypertension)    Hyperlipidemia    Hypertrophy of prostate with urinary obstruction and other lower urinary tract symptoms (LUTS)    OA (osteoarthritis)    OSA (obstructive sleep apnea)    cpap- 10    Other specified disorder of stomach and duodenum    duodenal periampulary tubulovillous adenoma removed by Dr. Christella Hartigan 5/10   Pericarditis 07/06/2019   Pneumonia    Pre-diabetes    no medications   Shortness of breath    with exertion   Sleep apnea    cpap   Testicular hypofunction    Past Surgical History:  Procedure Laterality Date   BILIARY STENT PLACEMENT N/A 03/19/2021   Procedure: BILIARY STENT PLACEMENT;  Surgeon: Lemar Lofty., MD;  Location: [facility];  Service: Gastroenterology;  Laterality: N/A;   BIV ICD INSERTION CRT-D N/A 03/27/2017   Procedure: BIV ICD INSERTION CRT-D;  Surgeon: Regan Lemming, MD;  Location: [facility] INVASIVE CV LAB;  Service: Cardiovascular;  Laterality: N/A;   CARDIAC CATHETERIZATION     '05, last 2009, showing  patent RCA stent   COLONOSCOPY  12/2007   HYPERPLASTIC POLYP   COLONOSCOPY  12/2019   COLONOSCOPY WITH PROPOFOL N/A 06/02/2014   Procedure: COLONOSCOPY WITH PROPOFOL;  Surgeon: Rachael Fee, MD;  Location: [facility] END[facility];  Service: Endoscopy;  Laterality: N/A;   CORONARY ANGIOPLASTY  08/2003   ENDOSCOPIC MUCOSAL RESECTION  03/19/2021   Procedure: ENDOSCOPIC MUCOSAL RESECTION, ampullectomy;  Surgeon: Lemar Lofty., MD;  Location: [facility];  Service: Gastroenterology;;   ENDOSCOPIC RETROGRADE CHOLANGIOPANCREATOGRAPHY (ERCP) WITH PROPOFOL N/A 03/19/2021   Procedure: ENDOSCOPIC RETROGRADE CHOLANGIOPANCREATOGRAPHY (ERCP) WITH PROPOFOL;  Surgeon: Lemar Lofty., MD;  Location: [facility];  Service: Gastroenterology;  Laterality: N/A;   ENDOSCOPIC RETROGRADE CHOLANGIOPANCREATOGRAPHY (ERCP) WITH PROPOFOL N/A 11/29/2021   Procedure: ENDOSCOPIC RETROGRADE CHOLANGIOPANCREATOGRAPHY (ERCP) WITH PROPOFOL;  Surgeon: Meridee Score Netty Starring., MD;  Location: [facility] ENDOSCOPY;  Service: Gastroenterology;  Laterality: N/A;   ESOPHAGOGASTRODUODENOSCOPY (EGD) WITH PROPOFOL N/A 06/02/2014   Procedure: ESOPHAGOGASTRODUODENOSCOPY (EGD) WITH PROPOFOL;  Surgeon: Rachael Fee, MD;  Location: [facility];  Service: Endoscopy;  Laterality: N/A;   ESOPHAGOGASTRODUODENOSCOPY (EGD) WITH PROPOFOL N/A 03/19/2021   Procedure: ESOPHAGOGASTRODUODENOSCOPY (EGD) WITH PROPOFOL;  Surgeon: Meridee Score Netty Starring., MD;  Location: [facility];  Service: Gastroenterology;  Laterality: N/A;   ESOPHAGOGASTRODUODENOSCOPY (EGD) WITH PROPOFOL N/A 03/23/2021  Procedure: ESOPHAGOGASTRODUODENOSCOPY (EGD) WITH PROPOFOL;  Surgeon: Meryl Dare, MD;  Location: [facility];  Service: Endoscopy;  Laterality: N/A;   hernia surgery x 3     Bilateral Inguinal, Umbicial   INSERTION OF MESH N/A 03/20/2018   Procedure: INSERTION OF MESH;  Surgeon: Ovidio Kin, MD;  Location: [facility] OR;  Service: General;  Laterality: N/A;    IRRIGATION AND DEBRIDEMENT ABSCESS Left 08/14/2012   Procedure: IRRIGATION AND DEBRIDEMENT LEFT INGUINAL BOIL ;  Surgeon: Kathi Ludwig, MD;  Location: [facility] ORS;  Service: Urology;  Laterality: Left;   JOINT REPLACEMENT Right 2017   KNEE ARTHROPLASTY     LEFT HEART CATH AND CORONARY ANGIOGRAPHY N/A 01/24/2019   Procedure: LEFT HEART CATH AND CORONARY ANGIOGRAPHY;  Surgeon: Swaziland, Peter M, MD;  Location: [facility] INVASIVE CV LAB;  Service: Cardiovascular;  Laterality: N/A;   LEFT HEART CATHETERIZATION WITH CORONARY ANGIOGRAM N/A 05/26/2014   Procedure: LEFT HEART CATHETERIZATION WITH CORONARY ANGIOGRAM;  Surgeon: Micheline Chapman, MD;  Location: [facility] CATH LAB;  Service: Cardiovascular;  Laterality: N/A;   LUMBAR LAMINECTOMY/DECOMPRESSION MICRODISCECTOMY Left 09/26/2014   Procedure: Lumbar Laminectomy for resection of synovial cyst  Lumbar five- sacral one left;  Surgeon: Donalee Citrin, MD;  Location: [facility] NEURO ORS;  Service: Neurosurgery;  Laterality: Left;   MOLE REMOVAL Left 03/20/2018   Procedure: MOLE REMOVAL;  Surgeon: Ovidio Kin, MD;  Location: [facility] OR;  Service: General;  Laterality: Left;   Oral surg to removed growth from ?sinus  10/2010   ?Dermoid removed by DrRiggs   PACEMAKER INSERTION  04/03/2017   PANCREATIC STENT PLACEMENT  03/19/2021   Procedure: PANCREATIC STENT PLACEMENT;  Surgeon: Lemar Lofty., MD;  Location: [facility];  Service: Gastroenterology;;   POLYPECTOMY     RCA stenting     '05 RCA   REMOVAL OF STONES  11/29/2021   Procedure: REMOVAL OF SLUDGE;  Surgeon: Meridee Score Netty Starring., MD;  Location: [facility] ENDOSCOPY;  Service: Gastroenterology;;   right toe surgery  Right    Cyst    RIGHT/LEFT HEART CATH AND CORONARY ANGIOGRAPHY N/A 01/15/2017   Procedure: RIGHT/LEFT HEART CATH AND CORONARY ANGIOGRAPHY;  Surgeon: Tonny Bollman, MD;  Location: [facility] INVASIVE CV LAB;  Service: Cardiovascular;  Laterality: N/A;   s/p right knee arthroscopy  2005   SPHINCTEROTOMY  03/19/2021    Procedure: SPHINCTEROTOMY;  Surgeon: Mansouraty, Netty Starring., MD;  Location: [facility];  Service: Gastroenterology;;   Francine Graven REMOVAL  11/29/2021   Procedure: STENT REMOVAL;  Surgeon: Lemar Lofty., MD;  Location: [facility] ENDOSCOPY;  Service: Gastroenterology;;   SUBMUCOSAL LIFTING INJECTION  03/19/2021   Procedure: SUBMUCOSAL LIFTING INJECTION;  Surgeon: Lemar Lofty., MD;  Location: [facility];  Service: Gastroenterology;;   thumb surgery Right    TOTAL KNEE ARTHROPLASTY Right 03/12/2016   Procedure: RIGHT TOTAL KNEE ARTHROPLASTY;  Surgeon: Durene Romans, MD;  Location: [facility] ORS;  Service: Orthopedics;  Laterality: Right;   TOTAL KNEE ARTHROPLASTY Left 12/10/2021   Procedure: TOTAL KNEE ARTHROPLASTY;  Surgeon: Ollen Gross, MD;  Location: [facility];  Service: Orthopedics;  Laterality: Left;   UPPER ESOPHAGEAL ENDOSCOPIC ULTRASOUND (EUS) N/A 03/19/2021   Procedure: UPPER ESOPHAGEAL ENDOSCOPIC ULTRASOUND (EUS);  Surgeon: Lemar Lofty., MD;  Location: [facility];  Service: Gastroenterology;  Laterality: N/A;   UPPER GASTROINTESTINAL ENDOSCOPY     VENTRAL HERNIA REPAIR N/A 03/20/2018   Procedure: LAPAROSCOPIC VENTRAL INCISIONAL  HERNIA ERAS PATHWAY;  Surgeon: Ovidio Kin, MD;  Location: [facility] OR;  Service: General;  Laterality:  N/A;   Current Facility-Administered Medications  Medication Dose Route Frequency Provider Last Rate Last Admin   0.9 %  sodium chloride infusion   Intravenous Continuous Doree Albee, PA-C        Current Facility-Administered Medications:    0.9 %  sodium chloride infusion, , Intravenous, Continuous, Steffanie Dunn, Amanda R, PA-C Allergies  Allergen Reactions   Sulfonamide Derivatives Rash        Family History  Problem Relation Age of Onset   Heart disease Mother    Coronary artery disease Father    Diabetes Father    Sudden death Father        due to heart disease   Heart disease Father    COPD Brother    Arthritis Brother    Heart  failure Brother    Cancer Brother        lung    Stomach cancer Paternal Grandmother    Arthritis Daughter    Hypertension Other    Colon cancer Neg Hx    Esophageal cancer Neg Hx    Colon polyps Neg Hx    Ulcerative colitis Neg Hx    Liver disease Neg Hx    Pancreatic cancer Neg Hx    Inflammatory bowel disease Neg Hx    Rectal cancer Neg Hx    Social History   Socioeconomic History   Marital status: Married    Spouse name: Not on file   Number of children: 1   Years of education: Not on file   Highest education level: Bachelor's degree (e.g., BA, AB, BS)  Occupational History   Occupation: Advertising account planner: [employer]  Tobacco Use   Smoking status: Former    Current packs/day: 0.00    Types: Cigarettes    Quit date: 05/20/1990    Years since quitting: 32.8    Passive exposure: Never   Smokeless tobacco: Never   Tobacco comments:    previous 30 pack year history  Vaping Use   Vaping status: Never Used  Substance and Sexual Activity   Alcohol use: Yes    Comment: rare   Drug use: Never   Sexual activity: Not on file  Other Topics Concern   Not on file  Social History Narrative   Not on file   Social Determinants of Health   Financial Resource Strain: Low Risk  (01/01/2023)   Overall Financial Resource Strain (CARDIA)    Difficulty of Paying Living Expenses: Not hard at all  Food Insecurity: No Food Insecurity (01/01/2023)   Hunger Vital Sign    Worried About Running Out of Food in the Last Year: Never true    Ran Out of Food in the Last Year: Never true  Transportation Needs: No Transportation Needs (01/01/2023)   PRAPARE - Administrator, Civil Service (Medical): No    Lack of Transportation (Non-Medical): No  Physical Activity: Sufficiently Active (01/01/2023)   Exercise Vital Sign    Days of Exercise per Week: 5 days    Minutes of Exercise per Session: 30 min  Stress: No Stress Concern Present (01/01/2023)   [employer] of Occupational Health - Occupational Stress Questionnaire    Feeling of Stress : Only a little  Social Connections: Socially Integrated (01/01/2023)   Social Connection and Isolation Panel [NHANES]    Frequency of Communication with Friends and Family: More than three times a week    Frequency of Social Gatherings with Friends and Family: Three  times a week    Attends Religious Services: More than 4 times per year    Active Member of Clubs or Organizations: Yes    Attends Banker Meetings: More than 4 times per year    Marital Status: Married  Catering manager Violence: Not on file    Physical Exam: Today's Vitals   03/13/23 0701  BP: (!) 147/73  Pulse: 80  Resp: 13  Temp: 97.9 F (36.6 C)  TempSrc: Tympanic  SpO2: 97%  Weight: 81.6 kg  Height: 5\' 7"  (1.702 m)  PainSc: 0-No pain   Body mass index is 28.18 kg/m. GEN: NAD EYE: Sclerae anicteric ENT: MMM CV: Non-tachycardic GI: Soft, NT/ND NEURO:  Alert & Oriented x 3  Lab Results: No results for input(s): "WBC", "HGB", "HCT", "PLT" in the last 72 hours. BMET No results for input(s): "NA", "K", "CL", "CO2", "GLUCOSE", "BUN", "CREATININE", "CALCIUM" in the last 72 hours. LFT No results for input(s): "PROT", "ALBUMIN", "AST", "ALT", "ALKPHOS", "BILITOT", "BILIDIR", "IBILI" in the last 72 hours. PT/INR No results for input(s): "LABPROT", "INR" in the last 72 hours.   Impression / Plan: This is a 77 y.o.male who presents for EGD for followup of previous ampullectomy endoscopically.  The risks and benefits of endoscopic evaluation/treatment were discussed with the patient and/or family; these include but are not limited to the risk of perforation, infection, bleeding, missed lesions, lack of diagnosis, severe illness requiring hospitalization, as well as anesthesia and sedation related illnesses.  The patient's history has been reviewed, patient examined, no change in status, and deemed stable for  procedure.  The patient and/or family is agreeable to proceed.    Corliss Parish, MD [facility] Office # [phone]

## 2023-03-15 ENCOUNTER — Other Ambulatory Visit: Payer: Self-pay | Admitting: Rheumatology

## 2023-03-15 DIAGNOSIS — M1990 Unspecified osteoarthritis, unspecified site: Secondary | ICD-10-CM

## 2023-03-17 ENCOUNTER — Other Ambulatory Visit (INDEPENDENT_AMBULATORY_CARE_PROVIDER_SITE_OTHER): Payer: Medicare Other

## 2023-03-17 ENCOUNTER — Encounter (HOSPITAL_COMMUNITY): Payer: Self-pay | Admitting: Gastroenterology

## 2023-03-17 DIAGNOSIS — D135 Benign neoplasm of extrahepatic bile ducts: Secondary | ICD-10-CM | POA: Diagnosis not present

## 2023-03-17 DIAGNOSIS — K862 Cyst of pancreas: Secondary | ICD-10-CM | POA: Diagnosis not present

## 2023-03-17 LAB — CBC WITH DIFFERENTIAL/PLATELET
Basophils Absolute: 0 10*3/uL (ref 0.0–0.1)
Basophils Relative: 0.6 % (ref 0.0–3.0)
Eosinophils Absolute: 0.1 10*3/uL (ref 0.0–0.7)
Eosinophils Relative: 2.1 % (ref 0.0–5.0)
HCT: 45.5 % (ref 39.0–52.0)
Hemoglobin: 14.9 g/dL (ref 13.0–17.0)
Lymphocytes Relative: 21.9 % (ref 12.0–46.0)
Lymphs Abs: 1.5 10*3/uL (ref 0.7–4.0)
MCHC: 32.7 g/dL (ref 30.0–36.0)
MCV: 88.2 fL (ref 78.0–100.0)
Monocytes Absolute: 0.7 10*3/uL (ref 0.1–1.0)
Monocytes Relative: 9.9 % (ref 3.0–12.0)
Neutro Abs: 4.5 10*3/uL (ref 1.4–7.7)
Neutrophils Relative %: 65.5 % (ref 43.0–77.0)
Platelets: 278 10*3/uL (ref 150.0–400.0)
RBC: 5.16 Mil/uL (ref 4.22–5.81)
RDW: 14.1 % (ref 11.5–15.5)
WBC: 6.9 10*3/uL (ref 4.0–10.5)

## 2023-03-17 NOTE — Telephone Encounter (Signed)
Last Fill: 01/15/2023  Eye exam: 10/08/2022 WNL    Labs: 12/10/2022 Glucose 105  Next Visit: 07/01/2023  Last Visit: 01/28/2023  YQ:[id number] rheumatoid arthritis   Current Dose per office note 01/28/2023: Plaquenil 200 mg p.o. daily Monday to Friday   Okay to refill Plaquenil?

## 2023-03-18 LAB — IBC + FERRITIN
Ferritin: 31.6 ng/mL (ref 22.0–322.0)
Iron: 119 ug/dL (ref 42–165)
Saturation Ratios: 30.5 % (ref 20.0–50.0)
TIBC: 390.6 ug/dL (ref 250.0–450.0)
Transferrin: 279 mg/dL (ref 212.0–360.0)

## 2023-04-01 ENCOUNTER — Ambulatory Visit: Payer: BLUE CROSS/BLUE SHIELD | Attending: Cardiology

## 2023-04-01 ENCOUNTER — Other Ambulatory Visit: Payer: Self-pay | Admitting: Rheumatology

## 2023-04-01 DIAGNOSIS — Z79899 Other long term (current) drug therapy: Secondary | ICD-10-CM | POA: Diagnosis not present

## 2023-04-01 DIAGNOSIS — I5022 Chronic systolic (congestive) heart failure: Secondary | ICD-10-CM

## 2023-04-01 DIAGNOSIS — M0569 Rheumatoid arthritis of multiple sites with involvement of other organs and systems: Secondary | ICD-10-CM | POA: Diagnosis not present

## 2023-04-01 DIAGNOSIS — Z95810 Presence of automatic (implantable) cardiac defibrillator: Secondary | ICD-10-CM

## 2023-04-01 DIAGNOSIS — H53021 Refractive amblyopia, right eye: Secondary | ICD-10-CM | POA: Diagnosis not present

## 2023-04-01 DIAGNOSIS — H2513 Age-related nuclear cataract, bilateral: Secondary | ICD-10-CM | POA: Diagnosis not present

## 2023-04-02 ENCOUNTER — Ambulatory Visit (INDEPENDENT_AMBULATORY_CARE_PROVIDER_SITE_OTHER): Payer: Medicare Other

## 2023-04-02 ENCOUNTER — Telehealth: Payer: Self-pay

## 2023-04-02 VITALS — Ht 67.0 in | Wt 180.0 lb

## 2023-04-02 DIAGNOSIS — Z0000 Encounter for general adult medical examination without abnormal findings: Secondary | ICD-10-CM | POA: Diagnosis not present

## 2023-04-02 NOTE — Telephone Encounter (Signed)
Last Fill:02/27/2023   UDS:01/28/2023 UDS consistent with tramadol use.    Narc Agreement: 01/28/2023   Next Visit: 07/01/2023   Last Visit: 01/28/2023   Dx: Seropositive rheumatoid arthritis    Current Dose per office note on 01/28/2023:tramadol 50 mg p.o. p.o. nightly as needed pain      Okay to refill Tramadol?

## 2023-04-02 NOTE — Patient Instructions (Addendum)
[name] , Thank you for taking time to come for your Medicare Wellness Visit. I appreciate your ongoing commitment to your health goals. Please review the following plan we discussed and let me know if I can assist you in the future.   Referrals/Orders/Follow-Ups/Clinician Recommendations: No  This is a list of the screening recommended for you and due dates:  Health Maintenance  Topic Date Due   Flu Shot  12/19/2022   COVID-19 Vaccine (5 - 2023-24 season) 01/19/2023   Medicare Annual Wellness Visit  04/01/2024   DTaP/Tdap/Td vaccine (2 - Td or Tdap) 11/28/2024   Pneumonia Vaccine  Completed   Hepatitis C Screening  Completed   Zoster (Shingles) Vaccine  Completed   HPV Vaccine  Aged Out   Colon Cancer Screening  Discontinued    Advanced directives: (Copy Requested) Please bring a copy of your health care power of attorney and living will to the office to be added to your chart at your convenience.  Next Medicare Annual Wellness Visit scheduled for next year: Yes

## 2023-04-02 NOTE — Progress Notes (Signed)
Subjective:   [name] is a 77 y.o. male who presents for an Initial Medicare Annual Wellness Visit.  Visit Complete: Virtual I connected with  [name] on 04/02/23 by a audio enabled telemedicine application and verified that I am speaking with the correct person using two identifiers.  Patient Location: Home  Provider Location: Office/Clinic  I discussed the limitations of evaluation and management by telemedicine. The patient expressed understanding and agreed to proceed.  Vital Signs: Because this visit was a virtual/telehealth visit, some criteria may be missing or patient reported. Any vitals not documented were not able to be obtained and vitals that have been documented are patient reported.  Patient Medicare AWV questionnaire was completed by the patient on 04/01/2023; I have confirmed that all information answered by patient is correct and no changes since this date.  Cardiac Risk Factors include: advanced age (>19men, >59 women);dyslipidemia;family history of premature cardiovascular disease;hypertension;male gender     Objective:    Today's Vitals   04/02/23 1021 04/02/23 1023  Weight: 180 lb (81.6 kg)   Height: 5\' 7"  (1.702 m)   PainSc: 6  6   PainLoc: Generalized    Body mass index is 28.19 kg/m.     04/02/2023   10:14 AM 03/13/2023    6:56 AM 12/10/2021   11:15 AM 11/29/2021    7:09 AM 11/26/2021    8:17 AM 03/22/2021    7:58 PM 03/22/2021   11:06 AM  Advanced Directives  Does Patient Have a Medical Advance Directive? Yes No Yes Yes Yes Yes No  Type of Estate agent of [place];Living will  Living will Living will Living will;Healthcare Power of Attorney Living will;Healthcare Power of Attorney   Does patient want to make changes to medical advance directive?   No - Patient declined   No - Patient declined   Copy of Healthcare Power of Attorney in Chart? No - copy requested     No - copy requested   Would patient like  information on creating a medical advance directive? No - Patient declined No - Patient declined         Current Medications (verified) Outpatient Encounter Medications as of 04/02/2023  Medication Sig   acetaminophen (TYLENOL) 650 MG CR tablet Take 650 mg by mouth in the morning and at bedtime.   albuterol (VENTOLIN HFA) 108 (90 Base) MCG/ACT inhaler Inhale 2 puffs into the lungs every 6 (six) hours as needed for wheezing or shortness of breath.   atorvastatin (LIPITOR) 20 MG tablet TAKE 1 TABLET BY MOUTH DAILY   b complex vitamins tablet Take 1 tablet by mouth daily with lunch.   betamethasone dipropionate 0.05 % cream    carvedilol (COREG) 25 MG tablet TAKE 1 TABLET BY MOUTH TWICE DAILY   cholecalciferol (VITAMIN D3) 25 MCG (1000 UNIT) tablet Take 1,000 Units by mouth daily.   Coenzyme Q10 (CO Q 10 PO) Take 200 mg by mouth daily with lunch.   ELIQUIS 5 MG TABS tablet TAKE 1 TABLET(5 MG) BY MOUTH TWICE DAILY   Flaxseed, Linseed, (FLAXSEED OIL PO) Take 1 capsule by mouth daily. With Omega 3   fluocinonide (LIDEX) 0.05 % external solution    folic acid (FOLVITE) 400 MCG tablet Take 400 mcg by mouth daily.   Glucosamine HCl (GLUCOSAMINE PO) Take 1,500 mg by mouth every evening.   hydrocortisone 2.5 % cream Apply 1 application topically 2 (two) times daily as needed (rash on nose).  hydroxychloroquine (PLAQUENIL) 200 MG tablet TAKE 1 TABLET BY MOUTH EVERY DAY ON MONDAY TO FRIDAY ONLY. DO NOT TAKE ON SATURDAY OR SUNDAY.   Lutein 20 MG TABS Take 20 mg by mouth daily.   Multiple Vitamin (MULTIVITAMIN) capsule Take 1 capsule by mouth daily.     nitroGLYCERIN (NITROSTAT) 0.4 MG SL tablet Dissolve 1 tablet under the tongue every 5 minutes as needed for chest pain. Max of 3 doses, then 911.   NON FORMULARY CPAP machine with sleep.   pantoprazole (PROTONIX) 40 MG tablet Take 1 tablet (40 mg total) by mouth daily. before breakfast   polyethylene glycol (MIRALAX / GLYCOLAX) 17 g packet Take 17 g by  mouth daily as needed for mild constipation.   Probiotic Product (PROBIOTIC DAILY PO) Take 1 capsule by mouth daily with lunch.    RESTASIS 0.05 % ophthalmic emulsion Place 1 drop into both eyes 2 (two) times daily as needed (dry eyes).   sacubitril-valsartan (ENTRESTO) 49-51 MG Take 1 tablet by mouth 2 (two) times daily.   Saw Palmetto, Serenoa repens, (SAW PALMETTO PO) Take 450 mg by mouth every evening.   spironolactone (ALDACTONE) 25 MG tablet TAKE 1/2 TABLET(12.5 MG) BY MOUTH DAILY   tamsulosin (FLOMAX) 0.4 MG CAPS capsule Take 0.4 mg by mouth daily.   Testosterone 20 % CREA Apply 2 mLs topically daily. Rub on shoulder   tobramycin-dexamethasone (TOBRADEX) ophthalmic ointment APPLY A SMALL AMOUNT ONTO ITCHING EYELID AREA 3X/DAY FOR 1 WEEK THEN DAILY FOR 1 WEEK THEN EVERY OTHER DAY FOR 2 WEEKS.   traMADol (ULTRAM) 50 MG tablet Take 1 tablet (50 mg total) by mouth at bedtime as needed.   TURMERIC PO Take 1 tablet by mouth daily with lunch.    vitamin C (ASCORBIC ACID) 500 MG tablet Take 500 mg by mouth daily.   zinc gluconate 50 MG tablet Take 50 mg by mouth daily.   No facility-administered encounter medications on file as of 04/02/2023.    Allergies (verified) Sulfonamide derivatives   History: Past Medical History:  Diagnosis Date   AICD (automatic cardioverter/defibrillator) present    Allergic rhinitis    Allergy    Anginal pain (HCC) 05/26/14   chest pain after chasing dog   Atypical nevus of back 04/27/2003   moderate - mid lower back   Basal cell carcinoma 01/26/2008   right cheek - MOHs   CAD (coronary artery disease)    hx of stent- 2005 RCA   Cataract    beginning stage both eyes   CHF (congestive heart failure) (HCC)    pacemaker Medtronic     Chronic back pain    intermittent   Constipation    Dizziness    Dysrhythmia    right bundle branch block    Esophageal stricture    GERD (gastroesophageal reflux disease)    Hemoptysis    Hiatal hernia    History of  colonic polyps    hyperplastic   HTN (hypertension)    Hyperlipidemia    Hypertrophy of prostate with urinary obstruction and other lower urinary tract symptoms (LUTS)    OA (osteoarthritis)    OSA (obstructive sleep apnea)    cpap- 10    Other specified disorder of stomach and duodenum    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 Christella Hartigan 5/10   Pericarditis 07/06/2019   Pneumonia    Pre-diabetes    no medications   Shortness of breath    with exertion   Sleep apnea  cpap   Testicular hypofunction    Past Surgical History:  Procedure Laterality Date   BILIARY STENT PLACEMENT N/A 03/19/2021   Procedure: BILIARY STENT PLACEMENT;  Surgeon: Lemar Lofty., MD;  Location: [facility] ENDOSCOPY;  Service: Gastroenterology;  Laterality: N/A;   BIV ICD INSERTION CRT-D N/A 03/27/2017   Procedure: BIV ICD INSERTION CRT-D;  Surgeon: Regan Lemming, MD;  Location: [facility] INVASIVE CV LAB;  Service: Cardiovascular;  Laterality: N/A;   CARDIAC CATHETERIZATION     '05, last 2009, showing patent RCA stent   COLONOSCOPY  12/2007   HYPERPLASTIC POLYP   COLONOSCOPY  12/2019   COLONOSCOPY WITH PROPOFOL N/A 06/02/2014   Procedure: COLONOSCOPY WITH PROPOFOL;  Surgeon: Rachael Fee, MD;  Location: [facility];  Service: Endoscopy;  Laterality: N/A;   CORONARY ANGIOPLASTY  08/2003   ENDOSCOPIC MUCOSAL RESECTION  03/19/2021   Procedure: ENDOSCOPIC MUCOSAL RESECTION, ampullectomy;  Surgeon: Lemar Lofty., MD;  Location: [facility];  Service: Gastroenterology;;   ENDOSCOPIC RETROGRADE CHOLANGIOPANCREATOGRAPHY (ERCP) WITH PROPOFOL N/A 03/19/2021   Procedure: ENDOSCOPIC RETROGRADE CHOLANGIOPANCREATOGRAPHY (ERCP) WITH PROPOFOL;  Surgeon: Lemar Lofty., MD;  Location: [facility];  Service: Gastroenterology;  Laterality: N/A;   ENDOSCOPIC RETROGRADE CHOLANGIOPANCREATOGRAPHY (ERCP) WITH PROPOFOL N/A 11/29/2021   Procedure: ENDOSCOPIC RETROGRADE CHOLANGIOPANCREATOGRAPHY  (ERCP) WITH PROPOFOL;  Surgeon: Meridee Score Netty Starring., MD;  Location: [facility] ENDOSCOPY;  Service: Gastroenterology;  Laterality: N/A;   ESOPHAGOGASTRODUODENOSCOPY (EGD) WITH PROPOFOL N/A 06/02/2014   Procedure: ESOPHAGOGASTRODUODENOSCOPY (EGD) WITH PROPOFOL;  Surgeon: Rachael Fee, MD;  Location: [facility];  Service: Endoscopy;  Laterality: N/A;   ESOPHAGOGASTRODUODENOSCOPY (EGD) WITH PROPOFOL N/A 03/19/2021   Procedure: ESOPHAGOGASTRODUODENOSCOPY (EGD) WITH PROPOFOL;  Surgeon: Meridee Score Netty Starring., MD;  Location: [facility];  Service: Gastroenterology;  Laterality: N/A;   ESOPHAGOGASTRODUODENOSCOPY (EGD) WITH PROPOFOL N/A 03/23/2021   Procedure: ESOPHAGOGASTRODUODENOSCOPY (EGD) WITH PROPOFOL;  Surgeon: Meryl Dare, MD;  Location: [facility];  Service: Endoscopy;  Laterality: N/A;   ESOPHAGOGASTRODUODENOSCOPY (EGD) WITH PROPOFOL N/A 03/13/2023   Procedure: ESOPHAGOGASTRODUODENOSCOPY (EGD) WITH PROPOFOL;  Surgeon: Meridee Score Netty Starring., MD;  Location: [facility];  Service: Gastroenterology;  Laterality: N/A;  WITH SIDE SCOPE   hernia surgery x 3     Bilateral Inguinal, Umbicial   INSERTION OF MESH N/A 03/20/2018   Procedure: INSERTION OF MESH;  Surgeon: Ovidio Kin, MD;  Location: [facility] OR;  Service: General;  Laterality: N/A;   IRRIGATION AND DEBRIDEMENT ABSCESS Left 08/14/2012   Procedure: IRRIGATION AND DEBRIDEMENT LEFT INGUINAL BOIL ;  Surgeon: Kathi Ludwig, MD;  Location: [facility]S;  Service: Urology;  Laterality: Left;   JOINT REPLACEMENT Right 2017   KNEE ARTHROPLASTY     LEFT HEART CATH AND CORONARY ANGIOGRAPHY N/A 01/24/2019   Procedure: LEFT HEART CATH AND CORONARY ANGIOGRAPHY;  Surgeon: Swaziland, Peter M, MD;  Location: [facility] INVASIVE CV LAB;  Service: Cardiovascular;  Laterality: N/A;   LEFT HEART CATHETERIZATION WITH CORONARY ANGIOGRAM N/A 05/26/2014   Procedure: LEFT HEART CATHETERIZATION WITH CORONARY ANGIOGRAM;  Surgeon: Micheline Chapman, MD;  Location: [facility] CATH LAB;   Service: Cardiovascular;  Laterality: N/A;   LUMBAR LAMINECTOMY/DECOMPRESSION MICRODISCECTOMY Left 09/26/2014   Procedure: Lumbar Laminectomy for resection of synovial cyst  Lumbar five- sacral one left;  Surgeon: Donalee Citrin, MD;  Location: [facility] NEURO ORS;  Service: Neurosurgery;  Laterality: Left;   MOLE REMOVAL Left 03/20/2018   Procedure: MOLE REMOVAL;  Surgeon: Ovidio Kin, MD;  Location: [facility] OR;  Service: General;  Laterality: Left;   Oral surg to removed growth  from ?sinus  10/2010   ?Dermoid removed by DrRiggs   PACEMAKER INSERTION  04/03/2017   PANCREATIC STENT PLACEMENT  03/19/2021   Procedure: PANCREATIC STENT PLACEMENT;  Surgeon: Lemar Lofty., MD;  Location: [facility];  Service: Gastroenterology;;   POLYPECTOMY     RCA stenting     '05 RCA   REMOVAL OF STONES  11/29/2021   Procedure: REMOVAL OF SLUDGE;  Surgeon: Meridee Score Netty Starring., MD;  Location: [facility] ENDOSCOPY;  Service: Gastroenterology;;   right toe surgery  Right    Cyst    RIGHT/LEFT HEART CATH AND CORONARY ANGIOGRAPHY N/A 01/15/2017   Procedure: RIGHT/LEFT HEART CATH AND CORONARY ANGIOGRAPHY;  Surgeon: Tonny Bollman, MD;  Location: [facility] INVASIVE CV LAB;  Service: Cardiovascular;  Laterality: N/A;   s/p right knee arthroscopy  2005   SPHINCTEROTOMY  03/19/2021   Procedure: SPHINCTEROTOMY;  Surgeon: Mansouraty, Netty Starring., MD;  Location: [facility]OS[facility];  Service: Gastroenterology;;   Francine Graven REMOVAL  11/29/2021   Procedure: STENT REMOVAL;  Surgeon: Lemar Lofty., MD;  Location: [facility] ENDOSCOPY;  Service: Gastroenterology;;   SUBMUCOSAL LIFTING INJECTION  03/19/2021   Procedure: SUBMUCOSAL LIFTING INJECTION;  Surgeon: Lemar Lofty., MD;  Location: [facility] ENDOSCOPY;  Service: Gastroenterology;;   thumb surgery Right    TOTAL KNEE ARTHROPLASTY Right 03/12/2016   Procedure: RIGHT TOTAL KNEE ARTHROPLASTY;  Surgeon: Durene Romans, MD;  Location: [facility]S;  Service: Orthopedics;  Laterality: Right;   TOTAL KNEE  ARTHROPLASTY Left 12/10/2021   Procedure: TOTAL KNEE ARTHROPLASTY;  Surgeon: Ollen Gross, MD;  Location: [facility];  Service: Orthopedics;  Laterality: Left;   UPPER ESOPHAGEAL ENDOSCOPIC ULTRASOUND (EUS) N/A 03/19/2021   Procedure: UPPER ESOPHAGEAL ENDOSCOPIC ULTRASOUND (EUS);  Surgeon: Lemar Lofty., MD;  Location: [facility] ENDOSCOPY;  Service: Gastroenterology;  Laterality: N/A;   UPPER GASTROINTESTINAL ENDOSCOPY     VENTRAL HERNIA REPAIR N/A 03/20/2018   Procedure: LAPAROSCOPIC VENTRAL INCISIONAL  HERNIA ERAS PATHWAY;  Surgeon: Ovidio Kin, MD;  Location: [facility] OR;  Service: General;  Laterality: N/A;   Family History  Problem Relation Age of Onset   Heart disease Mother    Coronary artery disease Father    Diabetes Father    Sudden death Father        due to heart disease   Heart disease Father    COPD Brother    Arthritis Brother    Heart failure Brother    Cancer Brother        lung    Stomach cancer Paternal Grandmother    Arthritis Daughter    Hypertension Other    Colon cancer Neg Hx    Esophageal cancer Neg Hx    Colon polyps Neg Hx    Ulcerative colitis Neg Hx    Liver disease Neg Hx    Pancreatic cancer Neg Hx    Inflammatory bowel disease Neg Hx    Rectal cancer Neg Hx    Social History   Socioeconomic History   Marital status: Married    Spouse name: Not on file   Number of children: 1   Years of education: Not on file   Highest education level: Bachelor's degree (e.g., BA, AB, BS)  Occupational History   Occupation: Advertising account planner: [employer]  Tobacco Use   Smoking status: Former    Current packs/day: 0.00    Types: Cigarettes    Quit date: 05/20/1990    Years since quitting: 32.8    Passive exposure: Never  Smokeless tobacco: Never   Tobacco comments:    previous 30 pack year history  Vaping Use   Vaping status: Never Used  Substance and Sexual Activity   Alcohol use: Yes    Comment: rare   Drug use: Never   Sexual  activity: Not on file  Other Topics Concern   Not on file  Social History Narrative   Not on file   Social Determinants of Health   Financial Resource Strain: Low Risk  (04/02/2023)   Overall Financial Resource Strain (CARDIA)    Difficulty of Paying Living Expenses: Not hard at all  Food Insecurity: No Food Insecurity (04/02/2023)   Hunger Vital Sign    Worried About Running Out of Food in the Last Year: Never true    Ran Out of Food in the Last Year: Never true  Transportation Needs: No Transportation Needs (04/02/2023)   PRAPARE - Administrator, Civil Service (Medical): No    Lack of Transportation (Non-Medical): No  Physical Activity: Sufficiently Active (04/02/2023)   Exercise Vital Sign    Days of Exercise per Week: 6 days    Minutes of Exercise per Session: 30 min  Stress: No Stress Concern Present (04/02/2023)   [employer] of Occupational Health - Occupational Stress Questionnaire    Feeling of Stress : Only a little  Social Connections: Unknown (04/02/2023)   Social Connection and Isolation Panel [NHANES]    Frequency of Communication with Friends and Family: More than three times a week    Frequency of Social Gatherings with Friends and Family: Twice a week    Attends Religious Services: Patient declined    Database administrator or Organizations: No    Attends Engineer, structural: Never    Marital Status: Married    Tobacco Counseling Counseling given: Not Answered Tobacco comments: previous 30 pack year history   Clinical Intake:  Pre-visit preparation completed: Yes  Pain : 0-10 Pain Score: 6  Pain Type: Chronic pain Pain Location: Generalized     BMI - recorded: 28.19 Nutritional Status: BMI 25 -29 Overweight Nutritional Risks: None Diabetes: No  How often do you need to have someone help you when you read instructions, pamphlets, or other written materials from your doctor or pharmacy?: 2 - Rarely What is the last  grade level you completed in school?: COLLEGE GRADUATE  Interpreter Needed?: No  Information entered by :: Susie Cassette, LPN.   Activities of Daily Living    04/02/2023   10:32 AM 04/01/2023   10:14 AM  In your present state of health, do you have any difficulty performing the following activities:  Hearing? 0 0  Vision? 1 1  Difficulty concentrating or making decisions? 0 0  Walking or climbing stairs? 1 1  Dressing or bathing? 0 0  Doing errands, shopping? 0 0  Preparing Food and eating ? N N  Using the Toilet? N N  In the past six months, have you accidently leaked urine? Y Y  Do you have problems with loss of bowel control? N N  Managing your Medications? N N  Managing your Finances? N N  Housekeeping or managing your Housekeeping? N N    Patient Care Team: Corwin Levins, MD as PCP - General (Internal Medicine) Regan Lemming, MD as PCP - Electrophysiology (Cardiology) Tonny Bollman, MD as PCP - Cardiology (Cardiology) Marzella Schlein., MD as Consulting Physician (Ophthalmology) Mansouraty, Netty Starring., MD as Consulting Physician (Gastroenterology)  Indicate any  recent Medical Services you may have received from other than [facility] providers in the past year (date may be approximate).     Assessment:   This is a routine wellness examination for Dru.  Hearing/Vision screen Hearing Screening - Comments:: No hearing aids. Vision Screening - Comments:: Patient does wear corrective lenses/contacts/readers.  Annual eye exam done by: Velna Ochs, OD.    Goals Addressed             This Visit's Progress    MY GOAL IS TO STAY ALIVE BY BEING ACTIVE, EATING RIGHT AND INDEPENDENT.        Depression Screen    04/02/2023   10:15 AM 01/02/2023    9:25 AM 09/23/2022    8:32 AM 05/29/2022    3:40 PM 05/29/2022    3:24 PM 09/26/2021    3:14 PM 09/26/2021    2:49 PM  PHQ 2/9 Scores  PHQ - 2 Score 1 0 0 0 0 0 0  PHQ- 9 Score 3      6    Fall Risk    04/02/2023    10:32 AM 04/01/2023   10:14 AM 01/02/2023    9:25 AM 09/23/2022    8:31 AM 05/29/2022    3:39 PM  Fall Risk   Falls in the past year? 0 0 0 0 0  Number falls in past yr: 0  0 0 0  Injury with Fall? 0 0 0 0 0  Risk for fall due to : No Fall Risks  No Fall Risks No Fall Risks   Follow up Falls prevention discussed  Falls evaluation completed Falls evaluation completed     MEDICARE RISK AT HOME: Medicare Risk at Home Any stairs in or around the home?: Yes If so, are there any without handrails?: No Home free of loose throw rugs in walkways, pet beds, electrical cords, etc?: Yes Adequate lighting in your home to reduce risk of falls?: Yes Life alert?: No Use of a cane, walker or w/c?: No Grab bars in the bathroom?: No Shower chair or bench in shower?: Yes Elevated toilet seat or a handicapped toilet?: Yes  TIMED UP AND GO:  Was the test performed? No    Cognitive Function:    04/02/2023   10:33 AM  MMSE - Mini Mental State Exam  Not completed: Unable to complete        04/02/2023   10:33 AM  6CIT Screen  What Year? 0 points  What month? 0 points  What time? 0 points  Count back from 20 0 points  Months in reverse 0 points  Repeat phrase 0 points  Total Score 0 points    Immunizations Immunization History  Administered Date(s) Administered   Fluad Quad(high Dose 65+) 02/18/2019, 02/21/2022   Influenza Split 03/21/2011   Influenza Whole 04/06/2009, 04/09/2010   Influenza, High Dose Seasonal PF 04/20/2015, 04/15/2016, 02/21/2017   Influenza,inj,Quad PF,6+ Mos 03/24/2014   Influenza-Unspecified 02/26/2018, 02/18/2019, 02/26/2020   PFIZER(Purple Top)SARS-COV-2 Vaccination 05/27/2019, 06/17/2019, 02/15/2020   Pfizer Covid-19 Vaccine Bivalent Booster 53yrs & up 02/21/2022   Pneumococcal Conjugate-13 11/29/2014   Pneumococcal Polysaccharide-23 06/25/2018   Rsv, Mab, Judi Cong, 0.5 Ml, Neonate To 24 Mos(Beyfortus) 02/21/2022   Tdap 11/29/2014   Unspecified  SARS-COV-2 Vaccination 02/03/2022   Zoster Recombinant(Shingrix) 06/07/2018, 09/26/2018   Zoster, Live 05/20/2006    TDAP status: Up to date  Flu Vaccine status: Due, Education has been provided regarding the importance of this vaccine. Advised may receive this vaccine at  local pharmacy or Health Dept. Aware to provide a copy of the vaccination record if obtained from local pharmacy or Health Dept. Verbalized acceptance and understanding.  Pneumococcal vaccine status: Up to date  Covid-19 vaccine status: Information provided on how to obtain vaccines.   Qualifies for Shingles Vaccine? Yes   Zostavax completed Yes   Shingrix Completed?: Yes  Screening Tests Health Maintenance  Topic Date Due   INFLUENZA VACCINE  12/19/2022   COVID-19 Vaccine (5 - 2023-24 season) 01/19/2023   Medicare Annual Wellness (AWV)  04/01/2024   DTaP/Tdap/Td (2 - Td or Tdap) 11/28/2024   Pneumonia Vaccine 64+ Years old  Completed   Hepatitis C Screening  Completed   Zoster Vaccines- Shingrix  Completed   HPV VACCINES  Aged Out   Colonoscopy  Discontinued    Health Maintenance  Health Maintenance Due  Topic Date Due   INFLUENZA VACCINE  12/19/2022   COVID-19 Vaccine (5 - 2023-24 season) 01/19/2023    Colorectal cancer screening: Type of screening: Colonoscopy. Completed 01/14/2020. Repeat every 5-7 years  Lung Cancer Screening: (Low Dose CT Chest recommended if Age 35-80 years, 20 pack-year currently smoking OR have quit w/in 15years.) does not qualify.   Lung Cancer Screening Referral: no  Additional Screening:  Hepatitis C Screening: does qualify; Completed 06/04/2007  Vision Screening: Recommended annual ophthalmology exams for early detection of glaucoma and other disorders of the eye. Is the patient up to date with their annual eye exam?  Yes  Who is the provider or what is the name of the office in which the patient attends annual eye exams? Velna Ochs, OD. If pt is not established with a  provider, would they like to be referred to a provider to establish care? No .   Dental Screening: Recommended annual dental exams for proper oral hygiene  Diabetic Foot Exam: N/A  Community Resource Referral / Chronic Care Management: CRR required this visit?  No   CCM required this visit?  No    Plan:     I have personally reviewed and noted the following in the patient's chart:   Medical and social history Use of alcohol, tobacco or illicit drugs  Current medications and supplements including opioid prescriptions. Patient is not currently taking opioid prescriptions. Functional ability and status Nutritional status Physical activity Advanced directives List of other physicians Hospitalizations, surgeries, and ER visits in previous 12 months Vitals Screenings to include cognitive, depression, and falls Referrals and appointments  In addition, I have reviewed and discussed with patient certain preventive protocols, quality metrics, and best practice recommendations. A written personalized care plan for preventive services as well as general preventive health recommendations were provided to patient.     Mickeal Needy, LPN   [id number]   After Visit Summary: (MyChart) Due to this being a telephonic visit, the after visit summary with patients personalized plan was offered to patient via MyChart   Nurse Notes: None

## 2023-04-02 NOTE — Telephone Encounter (Signed)
Patient is c/o severe fatigue everyday around 2pm.  He falls asleep between 2-3:00 pm everyday.  Patient stated that he uses a cpap and has for 15-20 years.  Patient questioning why he is having fatigue or does he need to do another sleep study.

## 2023-04-02 NOTE — Telephone Encounter (Signed)
Pt last seen per pulm feb 2024 and thought to be doing well with respect to CPAP use for OSA, and to f/u at 1 yr  Unless he has gained weight since then, it seems unlikely he would need another sleep study, but seeing pulmonary to make sure is not a bad idea  Otherwise, I couldn't say why the severe fatigue as he mentioned at 2 pm, but we could seen him in the office anytime at his convenience if he likes.  thanks

## 2023-04-07 NOTE — Progress Notes (Signed)
EPIC Encounter for ICM Monitoring  Patient Name: [name] is a 78 y.o. male Date: 04/07/2023 Primary Care Physican: Corwin Levins, MD Primary Cardiologist: Excell Seltzer Electrophysiologist: Elberta Fortis Bi-V Pacing:   93.0%   07/24/2022 Office Weight: 189 lbs 02/13/2023 Weight: 175 lbs   Time in AT/AF 0.1 hr/day (0.5%) Longest AT/AF 4 hours                                                            Transmission results reviewed.     Diet:  Does eat restaurant foods.     Optivol thoracic impedance suggesting normal fluid levels within the last month.   Prescribed: Spironolactone 25 mg take 0.5 tablet daily.   Labs: 12/10/2022 Creatinine 0.87, BUN 13, Potassium 4.2, Sodium 138, GFR 82.50  A complete set of results can be found in Results Review.   Recommendations: No changes.    Follow-up plan: ICM clinic phone appointment on 05/12/2023.   91 day device clinic remote transmission 05/01/2023.       EP/Cardiology Office Visits:   Recall 06/07/2023 with Dr Excell Seltzer.    Recall 07/19/2023 with Francis Dowse, PA   Copy of ICM check sent to Dr. Elberta Fortis.      3 month ICM trend: 04/01/2023.    12-14 Month ICM trend:     Karie Soda, RN 04/07/2023 4:54 PM

## 2023-04-11 DIAGNOSIS — Z23 Encounter for immunization: Secondary | ICD-10-CM | POA: Diagnosis not present

## 2023-04-26 ENCOUNTER — Other Ambulatory Visit: Payer: Self-pay | Admitting: Cardiovascular Disease

## 2023-04-29 ENCOUNTER — Other Ambulatory Visit: Payer: Self-pay | Admitting: *Deleted

## 2023-04-29 MED ORDER — SPIRONOLACTONE 25 MG PO TABS: 25.0000 mg | ORAL_TABLET | Freq: Every day | ORAL | 1 refills | Status: DC

## 2023-05-01 ENCOUNTER — Ambulatory Visit (INDEPENDENT_AMBULATORY_CARE_PROVIDER_SITE_OTHER): Payer: Medicare Other

## 2023-05-01 DIAGNOSIS — Z1152 Encounter for screening for COVID-19: Secondary | ICD-10-CM | POA: Diagnosis not present

## 2023-05-01 DIAGNOSIS — I428 Other cardiomyopathies: Secondary | ICD-10-CM | POA: Diagnosis not present

## 2023-05-01 DIAGNOSIS — Z20822 Contact with and (suspected) exposure to covid-19: Secondary | ICD-10-CM | POA: Diagnosis not present

## 2023-05-01 DIAGNOSIS — Z6828 Body mass index (BMI) 28.0-28.9, adult: Secondary | ICD-10-CM | POA: Diagnosis not present

## 2023-05-01 LAB — CUP PACEART REMOTE DEVICE CHECK
Battery Remaining Longevity: 7 mo
Battery Voltage: 2.85 V
Brady Statistic AP VP Percent: 1.86 %
Brady Statistic AP VS Percent: 0.03 %
Brady Statistic AS VP Percent: 95.16 %
Brady Statistic AS VS Percent: 2.96 %
Brady Statistic RA Percent Paced: 1.87 %
Brady Statistic RV Percent Paced: 41.3 %
Date Time Interrogation Session: 20241212001605
HighPow Impedance: 65 Ohm
Implantable Lead Connection Status: 753985
Implantable Lead Connection Status: 753985
Implantable Lead Connection Status: 753985
Implantable Lead Implant Date: 20181108
Implantable Lead Implant Date: 20181108
Implantable Lead Implant Date: 20181108
Implantable Lead Location: 753858
Implantable Lead Location: 753859
Implantable Lead Location: 753860
Implantable Lead Model: 4298
Implantable Lead Model: 5076
Implantable Pulse Generator Implant Date: 20181108
Lead Channel Impedance Value: 1121 Ohm
Lead Channel Impedance Value: 1121 Ohm
Lead Channel Impedance Value: 1197 Ohm
Lead Channel Impedance Value: 209 Ohm
Lead Channel Impedance Value: 234.08 Ohm
Lead Channel Impedance Value: 276.523
Lead Channel Impedance Value: 276.523
Lead Channel Impedance Value: 322.197
Lead Channel Impedance Value: 399 Ohm
Lead Channel Impedance Value: 418 Ohm
Lead Channel Impedance Value: 418 Ohm
Lead Channel Impedance Value: 532 Ohm
Lead Channel Impedance Value: 551 Ohm
Lead Channel Impedance Value: 608 Ohm
Lead Channel Impedance Value: 646 Ohm
Lead Channel Impedance Value: 722 Ohm
Lead Channel Impedance Value: 779 Ohm
Lead Channel Impedance Value: 817 Ohm
Lead Channel Pacing Threshold Amplitude: 0.5 V
Lead Channel Pacing Threshold Amplitude: 0.5 V
Lead Channel Pacing Threshold Amplitude: 2.5 V
Lead Channel Pacing Threshold Pulse Width: 0.4 ms
Lead Channel Pacing Threshold Pulse Width: 0.4 ms
Lead Channel Pacing Threshold Pulse Width: 1 ms
Lead Channel Sensing Intrinsic Amplitude: 10.75 mV
Lead Channel Sensing Intrinsic Amplitude: 10.75 mV
Lead Channel Sensing Intrinsic Amplitude: 2.75 mV
Lead Channel Sensing Intrinsic Amplitude: 2.75 mV
Lead Channel Setting Pacing Amplitude: 2 V
Lead Channel Setting Pacing Amplitude: 2.5 V
Lead Channel Setting Pacing Amplitude: 3 V
Lead Channel Setting Pacing Pulse Width: 0.4 ms
Lead Channel Setting Pacing Pulse Width: 1 ms
Lead Channel Setting Sensing Sensitivity: 0.3 mV
Zone Setting Status: 755011
Zone Setting Status: 755011

## 2023-05-05 ENCOUNTER — Other Ambulatory Visit: Payer: Self-pay | Admitting: Rheumatology

## 2023-05-05 NOTE — Telephone Encounter (Signed)
Last Fill: 04/02/2023  UDS:01/28/2023 UDS consistent with tramadol use.   Narc Agreement: 01/28/2023  Next Visit: 07/01/2023  Last Visit: 910/2024  Dx: Seropositive rheumatoid arthritis   Current Dose per office note on 01/28/2023:tramadol 50 mg p.o. p.o. nightly as needed pain    Okay to refill tramadol?

## 2023-05-05 NOTE — Telephone Encounter (Signed)
Last Fill: 04/02/2023  UDS: 01/28/2023 UDS consistent with tramadol use.   Narc Agreement: 01/28/2023  Next Visit: 07/01/2023  Last Visit: 01/28/2023  Dx: Seropositive rheumatoid arthritis   Current Dose per office note on 01/28/2023: tramadol 50 mg p.o. p.o. nightly as needed pain.   Okay to refill Tramadol?

## 2023-05-12 ENCOUNTER — Other Ambulatory Visit: Payer: Self-pay | Admitting: Rheumatology

## 2023-05-12 ENCOUNTER — Ambulatory Visit: Payer: BLUE CROSS/BLUE SHIELD | Attending: Cardiology

## 2023-05-12 DIAGNOSIS — Z95810 Presence of automatic (implantable) cardiac defibrillator: Secondary | ICD-10-CM | POA: Diagnosis not present

## 2023-05-12 DIAGNOSIS — I5022 Chronic systolic (congestive) heart failure: Secondary | ICD-10-CM

## 2023-05-12 DIAGNOSIS — M1990 Unspecified osteoarthritis, unspecified site: Secondary | ICD-10-CM

## 2023-05-16 NOTE — Progress Notes (Signed)
EPIC Encounter for ICM Monitoring  Patient Name: [name] is a 77 y.o. male Date: 05/16/2023 Primary Care Physican: Corwin Levins, MD Primary Cardiologist: Excell Seltzer Electrophysiologist: Elberta Fortis Bi-V Pacing:   91.1%   07/24/2022 Office Weight: 189 lbs 02/13/2023 Weight: 175 lbs   Time in AT/AF  <0.1 hr/day (<0.1%)                                                          Spoke with patient and heart failure questions reviewed.  Transmission results reviewed.  Pt asymptomatic for fluid accumulation.  Reports feeling well at this time and voices no complaints.     Diet:  Does eat restaurant foods.     Optivol thoracic impedance suggesting normal fluid levels with the exception of possible fluid accumulation from 12/8-12/21.   Prescribed: Spironolactone 25 mg take 0.5 tablet daily.   Labs: 12/10/2022 Creatinine 0.87, BUN 13, Potassium 4.2, Sodium 138, GFR 82.50  A complete set of results can be found in Results Review.   Recommendations:  No changes and encouraged to call if experiencing any fluid symptoms.    Follow-up plan: ICM clinic phone appointment on 06/16/2023.   91 day device clinic remote transmission 07/31/2023.       EP/Cardiology Office Visits:  06/12/2023 with Dr Excell Seltzer.    Recall 07/19/2023 with Francis Dowse, PA   Copy of ICM check sent to Dr. Elberta Fortis.      3 month ICM trend: 05/12/2023.    12-14 Month ICM trend:     Karie Soda, RN 05/16/2023 3:00 PM

## 2023-06-02 ENCOUNTER — Other Ambulatory Visit: Payer: Self-pay | Admitting: Cardiovascular Disease

## 2023-06-02 ENCOUNTER — Telehealth: Payer: Medicare Other | Admitting: Physician Assistant

## 2023-06-02 DIAGNOSIS — B9689 Other specified bacterial agents as the cause of diseases classified elsewhere: Secondary | ICD-10-CM | POA: Diagnosis not present

## 2023-06-02 DIAGNOSIS — J208 Acute bronchitis due to other specified organisms: Secondary | ICD-10-CM

## 2023-06-02 DIAGNOSIS — I480 Paroxysmal atrial fibrillation: Secondary | ICD-10-CM

## 2023-06-02 MED ORDER — AMOXICILLIN-POT CLAVULANATE 875-125 MG PO TABS: 1.0000 | ORAL_TABLET | Freq: Two times a day (BID) | ORAL | 0 refills | Status: DC

## 2023-06-02 MED ORDER — BENZONATATE 100 MG PO CAPS: 100.0000 mg | ORAL_CAPSULE | Freq: Three times a day (TID) | ORAL | 0 refills | Status: DC | PRN

## 2023-06-02 MED ORDER — ALBUTEROL SULFATE HFA 108 (90 BASE) MCG/ACT IN AERS: 1.0000 | INHALATION_SPRAY | Freq: Four times a day (QID) | RESPIRATORY_TRACT | 0 refills | Status: DC | PRN

## 2023-06-02 NOTE — Patient Instructions (Signed)
 [name], thank you for joining Delon CHRISTELLA Dickinson, PA-C for today's virtual visit.  While this provider is not your primary care provider (PCP), if your PCP is located in our provider database this encounter information will be shared with them immediately following your visit.   A [facility] MyChart account gives you access to today's visit and all your visits, tests, and labs performed at [facility]  click here if you don't have a [facility] MyChart account or go to [email or web address]  Consent: (Patient) [name] provided verbal consent for this virtual visit at the beginning of the encounter.  Current Medications:  Current Outpatient Medications:    albuterol  (VENTOLIN  HFA) 108 (90 Base) MCG/ACT inhaler, Inhale 1-2 puffs into the lungs every 6 (six) hours as needed., Disp: 8 g, Rfl: 0   amoxicillin -clavulanate (AUGMENTIN ) 875-125 MG tablet, Take 1 tablet by mouth 2 (two) times daily., Disp: 20 tablet, Rfl: 0   benzonatate  (TESSALON ) 100 MG capsule, Take 1-2 capsules (100-200 mg total) by mouth 3 (three) times daily as needed., Disp: 30 capsule, Rfl: 0   acetaminophen  (TYLENOL ) 650 MG CR tablet, Take 650 mg by mouth in the morning and at bedtime., Disp: , Rfl:    atorvastatin  (LIPITOR) 20 MG tablet, TAKE 1 TABLET BY MOUTH DAILY, Disp: 90 tablet, Rfl: 2   b complex vitamins tablet, Take 1 tablet by mouth daily with lunch., Disp: , Rfl:    betamethasone dipropionate 0.05 % cream, , Disp: , Rfl:    carvedilol  (COREG ) 25 MG tablet, TAKE 1 TABLET BY MOUTH TWICE DAILY, Disp: 180 tablet, Rfl: 3   cholecalciferol (VITAMIN D3) 25 MCG (1000 UNIT) tablet, Take 1,000 Units by mouth daily., Disp: , Rfl:    Coenzyme Q10 (CO Q 10 PO), Take 200 mg by mouth daily with lunch., Disp: , Rfl:    ELIQUIS  5 MG TABS tablet, TAKE 1 TABLET(5 MG) BY MOUTH TWICE DAILY, Disp: 180 tablet, Rfl: 1   Flaxseed, Linseed, (FLAXSEED OIL PO), Take 1 capsule by mouth daily. With Omega 3, Disp:  , Rfl:    fluocinonide (LIDEX) 0.05 % external solution, , Disp: , Rfl:    folic acid  (FOLVITE ) 400 MCG tablet, Take 400 mcg by mouth daily., Disp: , Rfl:    Glucosamine HCl (GLUCOSAMINE PO), Take 1,500 mg by mouth every evening., Disp: , Rfl:    hydrocortisone  2.5 % cream, Apply 1 application topically 2 (two) times daily as needed (rash on nose). , Disp: , Rfl:    hydroxychloroquine  (PLAQUENIL ) 200 MG tablet, TAKE 1 TABLET BY MOUTH EVERY DAY ON MONDAY TO FRIDAY ONLY. DO NOT TAKE ON SATURDAY OR SUNDAY., Disp: 60 tablet, Rfl: 0   Lutein  20 MG TABS, Take 20 mg by mouth daily., Disp: , Rfl:    Multiple Vitamin (MULTIVITAMIN) capsule, Take 1 capsule by mouth daily.  , Disp: , Rfl:    nitroGLYCERIN  (NITROSTAT ) 0.4 MG SL tablet, Dissolve 1 tablet under the tongue every 5 minutes as needed for chest pain. Max of 3 doses, then 911., Disp: 25 tablet, Rfl: 6   NON FORMULARY, CPAP machine with sleep., Disp: , Rfl:    pantoprazole  (PROTONIX ) 40 MG tablet, Take 1 tablet (40 mg total) by mouth daily. before breakfast, Disp: 90 tablet, Rfl: 3   polyethylene glycol (MIRALAX  / GLYCOLAX ) 17 g packet, Take 17 g by mouth daily as needed for mild constipation., Disp: 14 each, Rfl: 0   Probiotic Product (PROBIOTIC DAILY PO), Take 1 capsule  by mouth daily with lunch. , Disp: , Rfl:    RESTASIS  0.05 % ophthalmic emulsion, Place 1 drop into both eyes 2 (two) times daily as needed (dry eyes)., Disp: , Rfl:    sacubitril -valsartan  (ENTRESTO ) 49-51 MG, Take 1 tablet by mouth 2 (two) times daily., Disp: 180 tablet, Rfl: 3   Saw Palmetto, Serenoa repens, (SAW PALMETTO PO), Take 450 mg by mouth every evening., Disp: , Rfl:    spironolactone  (ALDACTONE ) 25 MG tablet, Take 1 tablet (25 mg total) by mouth daily., Disp: 45 tablet, Rfl: 1   tamsulosin  (FLOMAX ) 0.4 MG CAPS capsule, Take 0.4 mg by mouth daily., Disp: , Rfl:    Testosterone  20 % CREA, Apply 2 mLs topically daily. Rub on shoulder, Disp: , Rfl:     tobramycin-dexamethasone  (TOBRADEX) ophthalmic ointment, APPLY A SMALL AMOUNT ONTO ITCHING EYELID AREA 3X/DAY FOR 1 WEEK THEN DAILY FOR 1 WEEK THEN EVERY OTHER DAY FOR 2 WEEKS., Disp: , Rfl:    traMADol  (ULTRAM ) 50 MG tablet, Take 1 tablet (50 mg total) by mouth at bedtime as needed., Disp: 30 tablet, Rfl: 0   TURMERIC PO, Take 1 tablet by mouth daily with lunch. , Disp: , Rfl:    vitamin C (ASCORBIC ACID) 500 MG tablet, Take 500 mg by mouth daily., Disp: , Rfl:    zinc gluconate 50 MG tablet, Take 50 mg by mouth daily., Disp: , Rfl:    Medications ordered in this encounter:  Meds ordered this encounter  Medications   albuterol  (VENTOLIN  HFA) 108 (90 Base) MCG/ACT inhaler    Sig: Inhale 1-2 puffs into the lungs every 6 (six) hours as needed.    Dispense:  8 g    Refill:  0    Supervising Provider:   BLAISE ALEENE KIDD [[id number]]   amoxicillin -clavulanate (AUGMENTIN ) 875-125 MG tablet    Sig: Take 1 tablet by mouth 2 (two) times daily.    Dispense:  20 tablet    Refill:  0    Supervising Provider:   LAMPTEY, PHILIP O [[id number]]   benzonatate  (TESSALON ) 100 MG capsule    Sig: Take 1-2 capsules (100-200 mg total) by mouth 3 (three) times daily as needed.    Dispense:  30 capsule    Refill:  0    Supervising Provider:   BLAISE ALEENE KIDD [[id number]]     *If you need refills on other medications prior to your next appointment, please contact your pharmacy*  Follow-Up: Call back or seek an in-person evaluation if the symptoms worsen or if the condition fails to improve as anticipated.  [facility] [phone]  Other Instructions Upper Respiratory Infection, Adult An upper respiratory infection (URI) is a common viral infection of the nose, throat, and upper air passages that lead to the lungs. The most common type of URI is the common cold. URIs usually get better on their own, without medical treatment. What are the causes? A URI is caused by a virus. You may catch a virus  by: Breathing in droplets from an infected person's cough or sneeze. Touching something that has been exposed to the virus (is contaminated) and then touching your mouth, nose, or eyes. What increases the risk? You are more likely to get a URI if: You are very young or very old. You have close contact with others, such as at work, school, or a health care facility. You smoke. You have long-term (chronic) heart or lung disease. You have a weakened disease-fighting system (immune  system). You have nasal allergies or asthma. You are experiencing a lot of stress. You have poor nutrition. What are the signs or symptoms? A URI usually involves some of the following symptoms: Runny or stuffy (congested) nose. Cough. Sneezing. Sore throat. Headache. Fatigue. Fever. Loss of appetite. Pain in your forehead, behind your eyes, and over your cheekbones (sinus pain). Muscle aches. Redness or irritation of the eyes. Pressure in the ears or face. How is this diagnosed? This condition may be diagnosed based on your medical history and symptoms, and a physical exam. Your health care provider may use a swab to take a mucus sample from your nose (nasal swab). This sample can be tested to determine what virus is causing the illness. How is this treated? URIs usually get better on their own within 7-10 days. Medicines cannot cure URIs, but your health care provider may recommend certain medicines to help relieve symptoms, such as: Over-the-counter cold medicines. Cough suppressants. Coughing is a type of defense against infection that helps to clear the respiratory system, so take these medicines only as recommended by your health care provider. Fever-reducing medicines. Follow these instructions at home: Activity Rest as needed. If you have a fever, stay home from work or school until your fever is gone or until your health care provider says your URI cannot spread to other people (is no longer  contagious). Your health care provider may have you wear a face mask to prevent your infection from spreading. Relieving symptoms Gargle with a mixture of salt and water  3-4 times a day or as needed. To make salt water , completely dissolve -1 tsp (3-6 g) of salt in 1 cup (237 mL) of warm water . Use a cool-mist humidifier to add moisture to the air. This can help you breathe more easily. Eating and drinking  Drink enough fluid to keep your urine pale yellow. Eat soups and other clear broths. General instructions  Take over-the-counter and prescription medicines only as told by your health care provider. These include cold medicines, fever reducers, and cough suppressants. Do not use any products that contain nicotine or tobacco. These products include cigarettes, chewing tobacco, and vaping devices, such as e-cigarettes. If you need help quitting, ask your health care provider. Stay away from secondhand smoke. Stay up to date on all immunizations, including the yearly (annual) flu vaccine. Keep all follow-up visits. This is important. How to prevent the spread of infection to others URIs can be contagious. To prevent the infection from spreading: Wash your hands with soap and water  for at least 20 seconds. If soap and water  are not available, use hand sanitizer. Avoid touching your mouth, face, eyes, or nose. Cough or sneeze into a tissue or your sleeve or elbow instead of into your hand or into the air.  Contact a health care provider if: You are getting worse instead of better. You have a fever or chills. Your mucus is brown or red. You have yellow or brown discharge coming from your nose. You have pain in your face, especially when you bend forward. You have swollen neck glands. You have pain while swallowing. You have white areas in the back of your throat. Get help right away if: You have shortness of breath that gets worse. You have severe or persistent: Headache. Ear  pain. Sinus pain. Chest pain. You have chronic lung disease along with any of the following: Making high-pitched whistling sounds when you breathe, most often when you breathe out (wheezing). Prolonged cough (more than  14 days). Coughing up blood. A change in your usual mucus. You have a stiff neck. You have changes in your: Vision. Hearing. Thinking. Mood. These symptoms may be an emergency. Get help right away. Call 911. Do not wait to see if the symptoms will go away. Do not drive yourself to the hospital. Summary An upper respiratory infection (URI) is a common infection of the nose, throat, and upper air passages that lead to the lungs. A URI is caused by a virus. URIs usually get better on their own within 7-10 days. Medicines cannot cure URIs, but your health care provider may recommend certain medicines to help relieve symptoms. This information is not intended to replace advice given to you by your health care provider. Make sure you discuss any questions you have with your health care provider. Document Revised: 12/06/2020 Document Reviewed: 12/06/2020 Elsevier Patient Education  2024 [employer]    If you have been instructed to have an in-person evaluation today at a local Urgent Care facility, please use the link below. It will take you to a list of all of our available [facility], including address, phone number and hours of operation. Please do not delay care.  [facility]  If you or a family member do not have a primary care provider, use the link below to schedule a visit and establish care. When you choose a [facility] primary care physician or advanced practice provider, you gain a long-term partner in health. Find a Primary Care Provider  Learn more about [facility]'s in-office and virtual care options: [facility] - Get Care Now

## 2023-06-02 NOTE — Telephone Encounter (Signed)
 Prescription refill request for Eliquis received. Indication: Afib  Last office visit: 12/09/22 Excell Seltzer)  Scr: 0.87 (12/10/22)  Age: 78 Weight: 81.6kg  Appropriate dose. Refill sent.

## 2023-06-02 NOTE — Progress Notes (Signed)
 Virtual Visit Consent   [name], you are scheduled for a virtual visit with a [facility] provider today. Just as with appointments in the office, your consent must be obtained to participate. Your consent will be active for this visit and any virtual visit you may have with one of our providers in the next 365 days. If you have a MyChart account, a copy of this consent can be sent to you electronically.  As this is a virtual visit, video technology does not allow for your provider to perform a traditional examination. This may limit your provider's ability to fully assess your condition. If your provider identifies any concerns that need to be evaluated in person or the need to arrange testing (such as labs, EKG, etc.), we will make arrangements to do so. Although advances in technology are sophisticated, we cannot ensure that it will always work on either your end or our end. If the connection with a video visit is poor, the visit may have to be switched to a telephone visit. With either a video or telephone visit, we are not always able to ensure that we have a secure connection.  By engaging in this virtual visit, you consent to the provision of healthcare and authorize for your insurance to be billed (if applicable) for the services provided during this visit. Depending on your insurance coverage, you may receive a charge related to this service.  I need to obtain your verbal consent now. Are you willing to proceed with your visit today? [name] has provided verbal consent on 06/02/2023 for a virtual visit (video or telephone). Billy CHRISTELLA Dickinson, PA-C  Date: 06/02/2023 8:01 AM  Virtual Visit via Video Note   I, [name], connected with  [name]  ([id number], 01/05/1946) on 06/02/23 at  8:00 AM EST by a video-enabled telemedicine application and verified that I am speaking with the correct person using two identifiers.  Location: Patient: Virtual Visit Location  Patient: Home Provider: Virtual Visit Location Provider: Home Office   I discussed the limitations of evaluation and management by telemedicine and the availability of in person appointments. The patient expressed understanding and agreed to proceed.    History of Present Illness: [name] is a 78 y.o. who identifies as a male who was assigned male at birth, and is being seen today for cough.  HPI: Cough This is a new problem. The current episode started in the past 7 days. The problem has been gradually worsening. The problem occurs constantly. The cough is Non-productive. Associated symptoms include chest pain (tightness not pain), a fever (101), myalgias, nasal congestion, postnasal drip, rhinorrhea, a sore throat (improving), shortness of breath (very slight) and wheezing. Pertinent negatives include no chills, headaches or sweats. Associated symptoms comments: Sinus pain. The symptoms are aggravated by lying down and cold air. Treatments tried: dayquil, nyquil, saline nasal rinse. The treatment provided mild relief. His past medical history is significant for bronchitis. There is no history of asthma.      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   COVID 09/18/2022   Rupture of tendon of biceps, long head 07/11/2022   OSA (obstructive sleep apnea) 05/30/2022   Rhinorrhea 05/30/2022   Travel advice encounter 05/30/2022   Inflammation of sacroiliac joint (HCC) 05/29/2022   Encounter for well adult exam with abnormal findings 05/29/2022   Pain in joint of left knee 12/13/2021  Osteoarthritis of left knee 12/10/2021   Left hip pain 09/30/2021   Chronic anticoagulation 08/31/2021   History of ERCP 08/31/2021   History of biliary stent insertion 08/31/2021   Pancreatic cyst 08/31/2021   Trochanteric bursitis of left hip 06/23/2021   GI bleed 03/23/2021   Melena    Acute upper GI bleed 03/22/2021   AF (paroxysmal  atrial fibrillation) (HCC) 03/22/2021   Acquired hallux rigidus of left foot 02/14/2021   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   Abnormal findings on esophagogastroduodenoscopy (EGD) 02/03/2021   Polyp of duodenum 02/03/2021   Axillary abscess 06/26/2020   Chronic diarrhea 06/26/2020   Fever 01/18/2020   Pericarditis 07/06/2019   Right hip pain 07/06/2019   Acute chest pain 01/24/2019   Prediabetes 12/24/2018   Rheumatoid arthritis (HCC) 12/24/2018   Preventative health care 06/25/2018   Incarcerated ventral hernia 03/20/2018   DCM (dilated cardiomyopathy) (HCC) 02/11/2017   Chronic systolic heart failure (HCC) 01/15/2017   History of pneumonia 10/23/2016   OA (osteoarthritis) of knee 10/23/2016   Primary osteoarthritis of both feet 10/04/2016   Primary osteoarthritis of left knee 10/04/2016   DDD (degenerative disc disease), lumbar 10/04/2016   Hemoptysis 05/27/2016   Overweight (BMI 25.0-29.9) 03/14/2016   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   Degeneration of intervertebral disc of cervical spine without prolapsed disc 10/05/2015   Parotid mass 08/23/2015   Dyspnea 11/29/2014   Synovial cyst of lumbar facet joint 09/26/2014   Incisional hernia, without obstruction or gangrene 12/06/2013   Nausea alone 08/27/2013   Colon cancer screening 08/27/2013   Spinal stenosis 04/23/2013   Displacement of lumbar intervertebral disc without myelopathy 12/01/2012   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   Benign prostatic hyperplasia with urinary obstruction 06/18/2010   Other specified disorder of stomach and duodenum 12/16/2008   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 11/15/2008   Gastroesophageal reflux disease 09/02/2008   COLONIC POLYPS, HYPERPLASTIC 05/24/2007   OSA on CPAP 05/24/2007   ALLERGIC RHINITIS 05/24/2007   ESOPHAGEAL STRICTURE 05/24/2007   HIATAL HERNIA 05/24/2007   Hyperlipidemia 02/06/2007   Essential hypertension 02/06/2007   Coronary artery disease involving native coronary  artery of native heart without angina pectoris 02/06/2007   Primary osteoarthritis of both hands 02/06/2007   Neck pain on right side 02/06/2007    Allergies:  Allergies  Allergen Reactions   Sulfonamide Derivatives Rash        Medications:  Current Outpatient Medications:    acetaminophen  (TYLENOL ) 650 MG CR tablet, Take 650 mg by mouth in the morning and at bedtime., Disp: , Rfl:    albuterol  (VENTOLIN  HFA) 108 (90 Base) MCG/ACT inhaler, Inhale 2 puffs into the lungs every 6 (six) hours as needed for wheezing or shortness of breath., Disp: 18 g, Rfl: 0   atorvastatin  (LIPITOR) 20 MG tablet, TAKE 1 TABLET BY MOUTH DAILY, Disp: 90 tablet, Rfl: 2   b complex vitamins tablet, Take 1 tablet by mouth daily with lunch., Disp: , Rfl:    betamethasone dipropionate 0.05 % cream, , Disp: , Rfl:    carvedilol  (COREG ) 25 MG tablet, TAKE 1 TABLET BY MOUTH TWICE DAILY, Disp: 180 tablet, Rfl: 3   cholecalciferol (VITAMIN D3) 25 MCG (1000 UNIT) tablet, Take 1,000 Units by mouth daily., Disp: , Rfl:    Coenzyme Q10 (CO Q 10 PO), Take 200 mg by mouth daily with lunch., Disp: , Rfl:    ELIQUIS  5 MG TABS tablet, TAKE 1 TABLET(5 MG) BY MOUTH TWICE DAILY, Disp: 180 tablet, Rfl:  1   Flaxseed, Linseed, (FLAXSEED OIL PO), Take 1 capsule by mouth daily. With Omega 3, Disp: , Rfl:    fluocinonide (LIDEX) 0.05 % external solution, , Disp: , Rfl:    folic acid  (FOLVITE ) 400 MCG tablet, Take 400 mcg by mouth daily., Disp: , Rfl:    Glucosamine HCl (GLUCOSAMINE PO), Take 1,500 mg by mouth every evening., Disp: , Rfl:    hydrocortisone  2.5 % cream, Apply 1 application topically 2 (two) times daily as needed (rash on nose). , Disp: , Rfl:    hydroxychloroquine  (PLAQUENIL ) 200 MG tablet, TAKE 1 TABLET BY MOUTH EVERY DAY ON MONDAY TO FRIDAY ONLY. DO NOT TAKE ON SATURDAY OR SUNDAY., Disp: 60 tablet, Rfl: 0   Lutein  20 MG TABS, Take 20 mg by mouth daily., Disp: , Rfl:    Multiple Vitamin (MULTIVITAMIN) capsule, Take 1  capsule by mouth daily.  , Disp: , Rfl:    nitroGLYCERIN  (NITROSTAT ) 0.4 MG SL tablet, Dissolve 1 tablet under the tongue every 5 minutes as needed for chest pain. Max of 3 doses, then 911., Disp: 25 tablet, Rfl: 6   NON FORMULARY, CPAP machine with sleep., Disp: , Rfl:    pantoprazole  (PROTONIX ) 40 MG tablet, Take 1 tablet (40 mg total) by mouth daily. before breakfast, Disp: 90 tablet, Rfl: 3   polyethylene glycol (MIRALAX  / GLYCOLAX ) 17 g packet, Take 17 g by mouth daily as needed for mild constipation., Disp: 14 each, Rfl: 0   Probiotic Product (PROBIOTIC DAILY PO), Take 1 capsule by mouth daily with lunch. , Disp: , Rfl:    RESTASIS  0.05 % ophthalmic emulsion, Place 1 drop into both eyes 2 (two) times daily as needed (dry eyes)., Disp: , Rfl:    sacubitril -valsartan  (ENTRESTO ) 49-51 MG, Take 1 tablet by mouth 2 (two) times daily., Disp: 180 tablet, Rfl: 3   Saw Palmetto, Serenoa repens, (SAW PALMETTO PO), Take 450 mg by mouth every evening., Disp: , Rfl:    spironolactone  (ALDACTONE ) 25 MG tablet, Take 1 tablet (25 mg total) by mouth daily., Disp: 45 tablet, Rfl: 1   tamsulosin  (FLOMAX ) 0.4 MG CAPS capsule, Take 0.4 mg by mouth daily., Disp: , Rfl:    Testosterone  20 % CREA, Apply 2 mLs topically daily. Rub on shoulder, Disp: , Rfl:    tobramycin-dexamethasone  (TOBRADEX) ophthalmic ointment, APPLY A SMALL AMOUNT ONTO ITCHING EYELID AREA 3X/DAY FOR 1 WEEK THEN DAILY FOR 1 WEEK THEN EVERY OTHER DAY FOR 2 WEEKS., Disp: , Rfl:    traMADol  (ULTRAM ) 50 MG tablet, Take 1 tablet (50 mg total) by mouth at bedtime as needed., Disp: 30 tablet, Rfl: 0   TURMERIC PO, Take 1 tablet by mouth daily with lunch. , Disp: , Rfl:    vitamin C (ASCORBIC ACID) 500 MG tablet, Take 500 mg by mouth daily., Disp: , Rfl:    zinc gluconate 50 MG tablet, Take 50 mg by mouth daily., Disp: , Rfl:   Observations/Objective: Patient is well-developed, well-nourished in no acute distress.  Resting comfortably at home.  Head  is normocephalic, atraumatic.  No labored breathing.  Speech is clear and coherent with logical content.  Patient is alert and oriented at baseline.    Assessment and Plan: There are no diagnoses linked to this encounter. - Worsening despite OTC medications - Will treat with Augmentin , Albuterol  and tessalon  perles - Can continue Mucinex  - Push fluids.  - Rest.  - Steam and humidifier can help - Seek in person evaluation if  worsening or symptoms fail to improve    Follow Up Instructions: I discussed the assessment and treatment plan with the patient. The patient was provided an opportunity to ask questions and all were answered. The patient agreed with the plan and demonstrated an understanding of the instructions.  A copy of instructions were sent to the patient via MyChart unless otherwise noted below.    The patient was advised to call back or seek an in-person evaluation if the symptoms worsen or if the condition fails to improve as anticipated.    Billy CHRISTELLA Dickinson, PA-C

## 2023-06-03 ENCOUNTER — Encounter: Payer: Medicare Other | Admitting: Internal Medicine

## 2023-06-04 ENCOUNTER — Telehealth: Payer: Medicare Other | Admitting: Physician Assistant

## 2023-06-04 DIAGNOSIS — B9689 Other specified bacterial agents as the cause of diseases classified elsewhere: Secondary | ICD-10-CM | POA: Diagnosis not present

## 2023-06-04 DIAGNOSIS — J208 Acute bronchitis due to other specified organisms: Secondary | ICD-10-CM | POA: Diagnosis not present

## 2023-06-04 MED ORDER — PREDNISONE 20 MG PO TABS: 40.0000 mg | ORAL_TABLET | Freq: Every day | ORAL | 0 refills | Status: DC

## 2023-06-04 MED ORDER — ALBUTEROL SULFATE HFA 108 (90 BASE) MCG/ACT IN AERS: 2.0000 | INHALATION_SPRAY | Freq: Four times a day (QID) | RESPIRATORY_TRACT | 0 refills | Status: DC | PRN

## 2023-06-04 NOTE — Patient Instructions (Signed)
 [name], thank you for joining Hyla Maillard, PA-C for today's virtual visit.  While this provider is not your primary care provider (PCP), if your PCP is located in our provider database this encounter information will be shared with them immediately following your visit.   A [facility] MyChart account gives you access to today's visit and all your visits, tests, and labs performed at [facility] " click here if you don't have a [facility] MyChart account or go to [email or web address]  Consent: (Patient) [name] provided verbal consent for this virtual visit at the beginning of the encounter.  Current Medications:  Current Outpatient Medications:    acetaminophen  (TYLENOL ) 650 MG CR tablet, Take 650 mg by mouth in the morning and at bedtime., Disp: , Rfl:    albuterol  (VENTOLIN  HFA) 108 (90 Base) MCG/ACT inhaler, Inhale 1-2 puffs into the lungs every 6 (six) hours as needed., Disp: 8 g, Rfl: 0   amoxicillin -clavulanate (AUGMENTIN ) 875-125 MG tablet, Take 1 tablet by mouth 2 (two) times daily., Disp: 20 tablet, Rfl: 0   atorvastatin  (LIPITOR) 20 MG tablet, TAKE 1 TABLET BY MOUTH DAILY, Disp: 90 tablet, Rfl: 2   b complex vitamins tablet, Take 1 tablet by mouth daily with lunch., Disp: , Rfl:    benzonatate  (TESSALON ) 100 MG capsule, Take 1-2 capsules (100-200 mg total) by mouth 3 (three) times daily as needed., Disp: 30 capsule, Rfl: 0   betamethasone dipropionate 0.05 % cream, , Disp: , Rfl:    carvedilol  (COREG ) 25 MG tablet, TAKE 1 TABLET BY MOUTH TWICE DAILY, Disp: 180 tablet, Rfl: 3   cholecalciferol (VITAMIN D3) 25 MCG (1000 UNIT) tablet, Take 1,000 Units by mouth daily., Disp: , Rfl:    Coenzyme Q10 (CO Q 10 PO), Take 200 mg by mouth daily with lunch., Disp: , Rfl:    ELIQUIS  5 MG TABS tablet, TAKE 1 TABLET(5 MG) BY MOUTH TWICE DAILY, Disp: 180 tablet, Rfl: 1   Flaxseed, Linseed, (FLAXSEED OIL PO), Take 1 capsule by mouth daily. With Omega 3, Disp:  , Rfl:    fluocinonide (LIDEX) 0.05 % external solution, , Disp: , Rfl:    folic acid  (FOLVITE ) 400 MCG tablet, Take 400 mcg by mouth daily., Disp: , Rfl:    Glucosamine HCl (GLUCOSAMINE PO), Take 1,500 mg by mouth every evening., Disp: , Rfl:    hydrocortisone  2.5 % cream, Apply 1 application topically 2 (two) times daily as needed (rash on nose). , Disp: , Rfl:    hydroxychloroquine  (PLAQUENIL ) 200 MG tablet, TAKE 1 TABLET BY MOUTH EVERY DAY ON MONDAY TO FRIDAY ONLY. DO NOT TAKE ON SATURDAY OR SUNDAY., Disp: 60 tablet, Rfl: 0   Lutein  20 MG TABS, Take 20 mg by mouth daily., Disp: , Rfl:    Multiple Vitamin (MULTIVITAMIN) capsule, Take 1 capsule by mouth daily.  , Disp: , Rfl:    nitroGLYCERIN  (NITROSTAT ) 0.4 MG SL tablet, Dissolve 1 tablet under the tongue every 5 minutes as needed for chest pain. Max of 3 doses, then 911., Disp: 25 tablet, Rfl: 6   NON FORMULARY, CPAP machine with sleep., Disp: , Rfl:    pantoprazole  (PROTONIX ) 40 MG tablet, Take 1 tablet (40 mg total) by mouth daily. before breakfast, Disp: 90 tablet, Rfl: 3   polyethylene glycol (MIRALAX  / GLYCOLAX ) 17 g packet, Take 17 g by mouth daily as needed for mild constipation., Disp: 14 each, Rfl: 0   Probiotic Product (PROBIOTIC DAILY PO), Take 1 capsule  by mouth daily with lunch. , Disp: , Rfl:    RESTASIS  0.05 % ophthalmic emulsion, Place 1 drop into both eyes 2 (two) times daily as needed (dry eyes)., Disp: , Rfl:    sacubitril -valsartan  (ENTRESTO ) 49-51 MG, Take 1 tablet by mouth 2 (two) times daily., Disp: 180 tablet, Rfl: 3   Saw Palmetto, Serenoa repens, (SAW PALMETTO PO), Take 450 mg by mouth every evening., Disp: , Rfl:    spironolactone  (ALDACTONE ) 25 MG tablet, Take 1 tablet (25 mg total) by mouth daily., Disp: 45 tablet, Rfl: 1   tamsulosin  (FLOMAX ) 0.4 MG CAPS capsule, Take 0.4 mg by mouth daily., Disp: , Rfl:    Testosterone  20 % CREA, Apply 2 mLs topically daily. Rub on shoulder, Disp: , Rfl:     tobramycin-dexamethasone  (TOBRADEX) ophthalmic ointment, APPLY A SMALL AMOUNT ONTO ITCHING EYELID AREA 3X/DAY FOR 1 WEEK THEN DAILY FOR 1 WEEK THEN EVERY OTHER DAY FOR 2 WEEKS., Disp: , Rfl:    traMADol  (ULTRAM ) 50 MG tablet, Take 1 tablet (50 mg total) by mouth at bedtime as needed., Disp: 30 tablet, Rfl: 0   TURMERIC PO, Take 1 tablet by mouth daily with lunch. , Disp: , Rfl:    vitamin C (ASCORBIC ACID) 500 MG tablet, Take 500 mg by mouth daily., Disp: , Rfl:    zinc gluconate 50 MG tablet, Take 50 mg by mouth daily., Disp: , Rfl:    Medications ordered in this encounter:  No orders of the defined types were placed in this encounter.    *If you need refills on other medications prior to your next appointment, please contact your pharmacy*  Follow-Up: Call back or seek an in-person evaluation if the symptoms worsen or if the condition fails to improve as anticipated.  [facility] [phone]  Other Instructions Take antibiotic (Augmentin ) as directed.  Increase fluids.  Get plenty of rest. Use Mucinex for congestion. Take the prednisone  as directed. Continue your cough medication. I have sent in a replacement inhaler for you to use as directed. Take a daily probiotic (I recommend Align or Culturelle, but even Activia Yogurt may be beneficial).  A humidifier placed in the bedroom may offer some relief for a dry, scratchy throat of nasal irritation.  Read information below on acute bronchitis. Please seek in person evaluation of symptoms are not resolving, or if you note any new or worsening symptoms.  Acute Bronchitis Bronchitis is when the airways that extend from the windpipe into the lungs get red, puffy, and painful (inflamed). Bronchitis often causes thick spit (mucus) to develop. This leads to a cough. A cough is the most common symptom of bronchitis. In acute bronchitis, the condition usually begins suddenly and goes away over time (usually in 2 weeks). Smoking,  allergies, and asthma can make bronchitis worse. Repeated episodes of bronchitis may cause more lung problems.  HOME CARE Rest. Drink enough fluids to keep your pee (urine) clear or pale yellow (unless you need to limit fluids as told by your doctor). Only take over-the-counter or prescription medicines as told by your doctor. Avoid smoking and secondhand smoke. These can make bronchitis worse. If you are a smoker, think about using nicotine gum or skin patches. Quitting smoking will help your lungs heal faster. Reduce the chance of getting bronchitis again by: Washing your hands often. Avoiding people with cold symptoms. Trying not to touch your hands to your mouth, nose, or eyes. Follow up with your doctor as told.  GET  HELP IF: Your symptoms do not improve after 1 week of treatment. Symptoms include: Cough. Fever. Coughing up thick spit. Body aches. Chest congestion. Chills. Shortness of breath. Sore throat.  GET HELP RIGHT AWAY IF:  You have an increased fever. You have chills. You have severe shortness of breath. You have bloody thick spit (sputum). You throw up (vomit) often. You lose too much body fluid (dehydration). You have a severe headache. You faint.  MAKE SURE YOU:  Understand these instructions. Will watch your condition. Will get help right away if you are not doing well or get worse. Document Released: 10/23/2007 Document Revised: 01/06/2013 Document Reviewed: 10/27/2012 [facility] Patient Information 2015 [place], [place]. This information is not intended to replace advice given to you by your health care provider. Make sure you discuss any questions you have with your health care provider.    If you have been instructed to have an in-person evaluation today at a local Urgent Care facility, please use the link below. It will take you to a list of all of our available [facility], including address, phone number and hours of operation. Please do  not delay care.  [facility]  If you or a family member do not have a primary care provider, use the link below to schedule a visit and establish care. When you choose a [facility] primary care physician or advanced practice provider, you gain a long-term partner in health. Find a Primary Care Provider  Learn more about [facility]'s in-office and virtual care options: [facility] - Get Care Now

## 2023-06-04 NOTE — Progress Notes (Signed)
 Virtual Visit Consent   [name], you are scheduled for a virtual visit with a [facility] provider today. Just as with appointments in the office, your consent must be obtained to participate. Your consent will be active for this visit and any virtual visit you may have with one of our providers in the next 365 days. If you have a MyChart account, a copy of this consent can be sent to you electronically.  As this is a virtual visit, video technology does not allow for your provider to perform a traditional examination. This may limit your provider's ability to fully assess your condition. If your provider identifies any concerns that need to be evaluated in person or the need to arrange testing (such as labs, EKG, etc.), we will make arrangements to do so. Although advances in technology are sophisticated, we cannot ensure that it will always work on either your end or our end. If the connection with a video visit is poor, the visit may have to be switched to a telephone visit. With either a video or telephone visit, we are not always able to ensure that we have a secure connection.  By engaging in this virtual visit, you consent to the provision of healthcare and authorize for your insurance to be billed (if applicable) for the services provided during this visit. Depending on your insurance coverage, you may receive a charge related to this service.  I need to obtain your verbal consent now. Are you willing to proceed with your visit today? [name] has provided verbal consent on 06/04/2023 for a virtual visit (video or telephone). Hyla Maillard, [place]  Date: 06/04/2023 9:37 AM  Virtual Visit via Video Note   I, Hyla Maillard, connected with  [name]  ([id number], 1946/01/06) on 06/04/23 at  9:30 AM EST by a video-enabled telemedicine application and verified that I am speaking with the correct person using two identifiers.  Location: Patient: Virtual Visit Location  Patient: Home Provider: Virtual Visit Location Provider: Home Office   I discussed the limitations of evaluation and management by telemedicine and the availability of in person appointments. The patient expressed understanding and agreed to proceed.    History of Present Illness: [name] is a 78 y.o. who identifies as a male who was assigned male at birth, and is being seen today for continued chest congestion, cough and wheezing after being started on treatment for bacterial bronchitis 2 days ago. Was started on Augmentin , Albuterol  and Tessalon  which he endorses taking as directed. Notes tolerating antibiotic well thus far. Albuterol  only gave one initial puff and has not worked since then. He notes he called the pharmacy but was told he would need a new Rx. Is also using Mucinex-DM over the counter and noting some increase in sputum he is getting up. Denies chest pain. Denies SOB outside coughing episode.  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   COVID 09/18/2022   Rupture of tendon of biceps, long head 07/11/2022   OSA (obstructive sleep apnea) 05/30/2022   Rhinorrhea 05/30/2022   Travel advice encounter 05/30/2022   Inflammation of sacroiliac joint (HCC) 05/29/2022   Encounter for well adult exam with abnormal findings 05/29/2022   Pain in joint of left knee 12/13/2021   Osteoarthritis of left knee 12/10/2021   Left hip pain 09/30/2021   Chronic anticoagulation 08/31/2021  History of ERCP 08/31/2021   History of biliary stent insertion 08/31/2021   Pancreatic cyst 08/31/2021   Trochanteric bursitis of left hip 06/23/2021   GI bleed 03/23/2021   Melena    Acute upper GI bleed 03/22/2021   AF (paroxysmal atrial fibrillation) (HCC) 03/22/2021   Acquired hallux rigidus of left foot 02/14/2021   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   Abnormal findings on esophagogastroduodenoscopy (EGD)  02/03/2021   Polyp of duodenum 02/03/2021   Axillary abscess 06/26/2020   Chronic diarrhea 06/26/2020   Fever 01/18/2020   Pericarditis 07/06/2019   Right hip pain 07/06/2019   Acute chest pain 01/24/2019   Prediabetes 12/24/2018   Rheumatoid arthritis (HCC) 12/24/2018   Preventative health care 06/25/2018   Incarcerated ventral hernia 03/20/2018   DCM (dilated cardiomyopathy) (HCC) 02/11/2017   Chronic systolic heart failure (HCC) 01/15/2017   History of pneumonia 10/23/2016   OA (osteoarthritis) of knee 10/23/2016   Primary osteoarthritis of both feet 10/04/2016   Primary osteoarthritis of left knee 10/04/2016   DDD (degenerative disc disease), lumbar 10/04/2016   Hemoptysis 05/27/2016   Overweight (BMI 25.0-29.9) 03/14/2016   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   Degeneration of intervertebral disc of cervical spine without prolapsed disc 10/05/2015   Parotid mass 08/23/2015   Dyspnea 11/29/2014   Synovial cyst of lumbar facet joint 09/26/2014   Incisional hernia, without obstruction or gangrene 12/06/2013   Nausea alone 08/27/2013   Colon cancer screening 08/27/2013   Spinal stenosis 04/23/2013   Displacement of lumbar intervertebral disc without myelopathy 12/01/2012   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   Benign prostatic hyperplasia with urinary obstruction 06/18/2010   Other specified disorder of stomach and duodenum 12/16/2008   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 11/15/2008   Gastroesophageal reflux disease 09/02/2008   COLONIC POLYPS, HYPERPLASTIC 05/24/2007   OSA on CPAP 05/24/2007   ALLERGIC RHINITIS 05/24/2007   ESOPHAGEAL STRICTURE 05/24/2007   HIATAL HERNIA 05/24/2007   Hyperlipidemia 02/06/2007   Essential hypertension 02/06/2007   Coronary artery disease involving native coronary artery of native heart without angina pectoris 02/06/2007   Primary osteoarthritis of both hands 02/06/2007   Neck pain on right side 02/06/2007    Allergies:  Allergies   Allergen Reactions   Sulfonamide Derivatives Rash        Medications:  Current Outpatient Medications:    albuterol  (VENTOLIN  HFA) 108 (90 Base) MCG/ACT inhaler, Inhale 2 puffs into the lungs every 6 (six) hours as needed for wheezing or shortness of breath., Disp: 8 g, Rfl: 0   predniSONE  (DELTASONE ) 20 MG tablet, Take 2 tablets (40 mg total) by mouth daily with breakfast., Disp: 10 tablet, Rfl: 0   acetaminophen  (TYLENOL ) 650 MG CR tablet, Take 650 mg by mouth in the morning and at bedtime., Disp: , Rfl:    amoxicillin -clavulanate (AUGMENTIN ) 875-125 MG tablet, Take 1 tablet by mouth 2 (two) times daily., Disp: 20 tablet, Rfl: 0   atorvastatin  (LIPITOR) 20 MG tablet, TAKE 1 TABLET BY MOUTH DAILY, Disp: 90 tablet, Rfl: 2   b complex vitamins tablet, Take 1 tablet by mouth daily with lunch., Disp: , Rfl:    benzonatate  (TESSALON ) 100 MG capsule, Take 1-2 capsules (100-200 mg total) by mouth 3 (three) times daily as needed., Disp: 30 capsule, Rfl: 0   betamethasone dipropionate 0.05 % cream, , Disp: , Rfl:    carvedilol  (COREG ) 25 MG tablet, TAKE 1 TABLET BY MOUTH TWICE DAILY, Disp: 180 tablet, Rfl: 3   cholecalciferol (VITAMIN D3) 25 MCG (  1000 UNIT) tablet, Take 1,000 Units by mouth daily., Disp: , Rfl:    Coenzyme Q10 (CO Q 10 PO), Take 200 mg by mouth daily with lunch., Disp: , Rfl:    ELIQUIS  5 MG TABS tablet, TAKE 1 TABLET(5 MG) BY MOUTH TWICE DAILY, Disp: 180 tablet, Rfl: 1   Flaxseed, Linseed, (FLAXSEED OIL PO), Take 1 capsule by mouth daily. With Omega 3, Disp: , Rfl:    fluocinonide (LIDEX) 0.05 % external solution, , Disp: , Rfl:    folic acid  (FOLVITE ) 400 MCG tablet, Take 400 mcg by mouth daily., Disp: , Rfl:    Glucosamine HCl (GLUCOSAMINE PO), Take 1,500 mg by mouth every evening., Disp: , Rfl:    hydrocortisone  2.5 % cream, Apply 1 application topically 2 (two) times daily as needed (rash on nose). , Disp: , Rfl:    hydroxychloroquine  (PLAQUENIL ) 200 MG tablet, TAKE 1 TABLET BY  MOUTH EVERY DAY ON MONDAY TO FRIDAY ONLY. DO NOT TAKE ON SATURDAY OR SUNDAY., Disp: 60 tablet, Rfl: 0   Lutein  20 MG TABS, Take 20 mg by mouth daily., Disp: , Rfl:    Multiple Vitamin (MULTIVITAMIN) capsule, Take 1 capsule by mouth daily.  , Disp: , Rfl:    nitroGLYCERIN  (NITROSTAT ) 0.4 MG SL tablet, Dissolve 1 tablet under the tongue every 5 minutes as needed for chest pain. Max of 3 doses, then 911., Disp: 25 tablet, Rfl: 6   NON FORMULARY, CPAP machine with sleep., Disp: , Rfl:    pantoprazole  (PROTONIX ) 40 MG tablet, Take 1 tablet (40 mg total) by mouth daily. before breakfast, Disp: 90 tablet, Rfl: 3   polyethylene glycol (MIRALAX  / GLYCOLAX ) 17 g packet, Take 17 g by mouth daily as needed for mild constipation., Disp: 14 each, Rfl: 0   Probiotic Product (PROBIOTIC DAILY PO), Take 1 capsule by mouth daily with lunch. , Disp: , Rfl:    RESTASIS  0.05 % ophthalmic emulsion, Place 1 drop into both eyes 2 (two) times daily as needed (dry eyes)., Disp: , Rfl:    sacubitril -valsartan  (ENTRESTO ) 49-51 MG, Take 1 tablet by mouth 2 (two) times daily., Disp: 180 tablet, Rfl: 3   Saw Palmetto, Serenoa repens, (SAW PALMETTO PO), Take 450 mg by mouth every evening., Disp: , Rfl:    spironolactone  (ALDACTONE ) 25 MG tablet, Take 1 tablet (25 mg total) by mouth daily., Disp: 45 tablet, Rfl: 1   tamsulosin  (FLOMAX ) 0.4 MG CAPS capsule, Take 0.4 mg by mouth daily., Disp: , Rfl:    Testosterone  20 % CREA, Apply 2 mLs topically daily. Rub on shoulder, Disp: , Rfl:    tobramycin-dexamethasone  (TOBRADEX) ophthalmic ointment, APPLY A SMALL AMOUNT ONTO ITCHING EYELID AREA 3X/DAY FOR 1 WEEK THEN DAILY FOR 1 WEEK THEN EVERY OTHER DAY FOR 2 WEEKS., Disp: , Rfl:    traMADol  (ULTRAM ) 50 MG tablet, Take 1 tablet (50 mg total) by mouth at bedtime as needed., Disp: 30 tablet, Rfl: 0   TURMERIC PO, Take 1 tablet by mouth daily with lunch. , Disp: , Rfl:    vitamin C (ASCORBIC ACID) 500 MG tablet, Take 500 mg by mouth daily.,  Disp: , Rfl:    zinc gluconate 50 MG tablet, Take 50 mg by mouth daily., Disp: , Rfl:   Observations/Objective: Patient is well-developed, well-nourished in no acute distress.  Resting comfortably at home.  Head is normocephalic, atraumatic.  No labored breathing. Speech is clear and coherent with logical content.  Patient is alert and oriented at baseline.  Assessment and Plan: 1. Acute bacterial bronchitis (Primary) - predniSONE  (DELTASONE ) 20 MG tablet; Take 2 tablets (40 mg total) by mouth daily with breakfast.  Dispense: 10 tablet; Refill: 0 - albuterol  (VENTOLIN  HFA) 108 (90 Base) MCG/ACT inhaler; Inhale 2 puffs into the lungs every 6 (six) hours as needed for wheezing or shortness of breath.  Dispense: 8 g; Refill: 0  Rx Prednisone  5 day burst.  Increase fluids.  Rest.  Saline nasal spray.  Probiotic.  Mucinex as directed.  Humidifier in bedroom. Script for replacement albuterol  sent to pharmacy with note to pharmacist. Continue Tessalon  and Mucinex-DM.  Call or return to clinic if symptoms are not improving.   Follow Up Instructions: I discussed the assessment and treatment plan with the patient. The patient was provided an opportunity to ask questions and all were answered. The patient agreed with the plan and demonstrated an understanding of the instructions.  A copy of instructions were sent to the patient via MyChart unless otherwise noted below.   The patient was advised to call back or seek an in-person evaluation if the symptoms worsen or if the condition fails to improve as anticipated.    Hyla Maillard, PA-C

## 2023-06-06 NOTE — Progress Notes (Signed)
Remote ICD transmission.   

## 2023-06-07 ENCOUNTER — Other Ambulatory Visit: Payer: Self-pay | Admitting: Physician Assistant

## 2023-06-09 NOTE — Telephone Encounter (Signed)
Last Fill: 05/05/2023   UDS:01/28/2023 UDS consistent with tramadol use.    Narc Agreement: 01/28/2023   Next Visit: 07/01/2023   Last Visit: 910/2024   Dx: Seropositive rheumatoid arthritis    Current Dose per office note on 01/28/2023:tramadol 50 mg p.o. p.o. nightly as needed pain      Okay to refill tramadol?

## 2023-06-10 ENCOUNTER — Ambulatory Visit (INDEPENDENT_AMBULATORY_CARE_PROVIDER_SITE_OTHER): Payer: Medicare Other

## 2023-06-10 ENCOUNTER — Encounter: Payer: Self-pay | Admitting: Internal Medicine

## 2023-06-10 ENCOUNTER — Ambulatory Visit (INDEPENDENT_AMBULATORY_CARE_PROVIDER_SITE_OTHER): Payer: Medicare Other | Admitting: Internal Medicine

## 2023-06-10 ENCOUNTER — Other Ambulatory Visit: Payer: Self-pay | Admitting: Internal Medicine

## 2023-06-10 VITALS — BP 128/72 | HR 71 | Temp 98.1°F | Resp 18 | Ht 67.0 in | Wt 185.6 lb

## 2023-06-10 DIAGNOSIS — Z0000 Encounter for general adult medical examination without abnormal findings: Secondary | ICD-10-CM | POA: Diagnosis not present

## 2023-06-10 DIAGNOSIS — E559 Vitamin D deficiency, unspecified: Secondary | ICD-10-CM

## 2023-06-10 DIAGNOSIS — R052 Subacute cough: Secondary | ICD-10-CM

## 2023-06-10 DIAGNOSIS — R972 Elevated prostate specific antigen [PSA]: Secondary | ICD-10-CM

## 2023-06-10 DIAGNOSIS — Z125 Encounter for screening for malignant neoplasm of prostate: Secondary | ICD-10-CM | POA: Diagnosis not present

## 2023-06-10 DIAGNOSIS — J329 Chronic sinusitis, unspecified: Secondary | ICD-10-CM | POA: Diagnosis not present

## 2023-06-10 DIAGNOSIS — I10 Essential (primary) hypertension: Secondary | ICD-10-CM

## 2023-06-10 DIAGNOSIS — R059 Cough, unspecified: Secondary | ICD-10-CM | POA: Insufficient documentation

## 2023-06-10 DIAGNOSIS — E782 Mixed hyperlipidemia: Secondary | ICD-10-CM | POA: Diagnosis not present

## 2023-06-10 DIAGNOSIS — N320 Bladder-neck obstruction: Secondary | ICD-10-CM | POA: Diagnosis not present

## 2023-06-10 DIAGNOSIS — R7303 Prediabetes: Secondary | ICD-10-CM

## 2023-06-10 DIAGNOSIS — Z0001 Encounter for general adult medical examination with abnormal findings: Secondary | ICD-10-CM

## 2023-06-10 DIAGNOSIS — E538 Deficiency of other specified B group vitamins: Secondary | ICD-10-CM

## 2023-06-10 LAB — URINALYSIS, ROUTINE W REFLEX MICROSCOPIC
Bilirubin Urine: NEGATIVE
Hgb urine dipstick: NEGATIVE
Ketones, ur: NEGATIVE
Leukocytes,Ua: NEGATIVE
Nitrite: NEGATIVE
RBC / HPF: NONE SEEN (ref 0–?)
Specific Gravity, Urine: 1.02 (ref 1.000–1.030)
Total Protein, Urine: NEGATIVE
Urine Glucose: NEGATIVE
Urobilinogen, UA: 0.2 (ref 0.0–1.0)
pH: 6 (ref 5.0–8.0)

## 2023-06-10 LAB — CBC WITH DIFFERENTIAL/PLATELET
Basophils Absolute: 0 10*3/uL (ref 0.0–0.1)
Basophils Relative: 0.3 % (ref 0.0–3.0)
Eosinophils Absolute: 0.1 10*3/uL (ref 0.0–0.7)
Eosinophils Relative: 1.2 % (ref 0.0–5.0)
HCT: 44.3 % (ref 39.0–52.0)
Hemoglobin: 15 g/dL (ref 13.0–17.0)
Lymphocytes Relative: 29.3 % (ref 12.0–46.0)
Lymphs Abs: 3.3 10*3/uL (ref 0.7–4.0)
MCHC: 33.8 g/dL (ref 30.0–36.0)
MCV: 91 fL (ref 78.0–100.0)
Monocytes Absolute: 1.2 10*3/uL — ABNORMAL HIGH (ref 0.1–1.0)
Monocytes Relative: 10.2 % (ref 3.0–12.0)
Neutro Abs: 6.7 10*3/uL (ref 1.4–7.7)
Neutrophils Relative %: 59 % (ref 43.0–77.0)
Platelets: 313 10*3/uL (ref 150.0–400.0)
RBC: 4.87 Mil/uL (ref 4.22–5.81)
RDW: 13.6 % (ref 11.5–15.5)
WBC: 11.3 10*3/uL — ABNORMAL HIGH (ref 4.0–10.5)

## 2023-06-10 LAB — LIPID PANEL
Cholesterol: 135 mg/dL (ref 0–200)
HDL: 31.6 mg/dL — ABNORMAL LOW (ref >39.00)
LDL Cholesterol: 34 mg/dL (ref 0–99)
NonHDL: 103.01
Total CHOL/HDL Ratio: 4
Triglycerides: 345 mg/dL — ABNORMAL HIGH (ref 0.0–149.0)
VLDL: 69 mg/dL — ABNORMAL HIGH (ref 0.0–40.0)

## 2023-06-10 LAB — BASIC METABOLIC PANEL
BUN: 23 mg/dL (ref 6–23)
CO2: 30 meq/L (ref 19–32)
Calcium: 9.9 mg/dL (ref 8.4–10.5)
Chloride: 101 meq/L (ref 96–112)
Creatinine, Ser: 0.89 mg/dL (ref 0.40–1.50)
GFR: 81.65 mL/min (ref >60.00)
Glucose, Bld: 95 mg/dL (ref 70–99)
Potassium: 3.8 meq/L (ref 3.5–5.1)
Sodium: 139 meq/L (ref 135–145)

## 2023-06-10 LAB — VITAMIN B12: Vitamin B-12: 769 pg/mL (ref 211–911)

## 2023-06-10 LAB — HEPATIC FUNCTION PANEL
ALT: 20 U/L (ref 0–53)
AST: 18 U/L (ref 0–37)
Albumin: 4.5 g/dL (ref 3.5–5.2)
Alkaline Phosphatase: 63 U/L (ref 39–117)
Bilirubin, Direct: 0.2 mg/dL (ref 0.0–0.3)
Total Bilirubin: 0.9 mg/dL (ref 0.2–1.2)
Total Protein: 6.9 g/dL (ref 6.0–8.3)

## 2023-06-10 LAB — MICROALBUMIN / CREATININE URINE RATIO
Creatinine,U: 91.1 mg/dL
Microalb Creat Ratio: 0.8 mg/g (ref 0.0–30.0)
Microalb, Ur: <0.7 mg/dL (ref 0.0–1.9)

## 2023-06-10 LAB — HEMOGLOBIN A1C: Hgb A1c MFr Bld: 6.6 % — ABNORMAL HIGH (ref 4.6–6.5)

## 2023-06-10 LAB — TSH: TSH: 3.39 u[IU]/mL (ref 0.35–5.50)

## 2023-06-10 LAB — PSA: PSA: 4.24 ng/mL — ABNORMAL HIGH (ref 0.10–4.00)

## 2023-06-10 LAB — BRAIN NATRIURETIC PEPTIDE: Pro B Natriuretic peptide (BNP): 55 pg/mL (ref 0.0–100.0)

## 2023-06-10 LAB — VITAMIN D 25 HYDROXY (VIT D DEFICIENCY, FRACTURES): VITD: 28.36 ng/mL — ABNORMAL LOW (ref 30.00–100.00)

## 2023-06-10 MED ORDER — HYDROCODONE BIT-HOMATROP MBR 5-1.5 MG/5ML PO SOLN: 5.0000 mL | Freq: Four times a day (QID) | ORAL | 0 refills | Status: AC | PRN

## 2023-06-10 NOTE — Patient Instructions (Signed)
Please take all new medication as prescribed - the cough medicine as needed  Please continue all other medications as before, and refills have been done if requested.  Please have the pharmacy call with any other refills you may need.  Please continue your efforts at being more active, low cholesterol diet, and weight control.  You are otherwise up to date with prevention measures today.  Please keep your appointments with your specialists as you may have planned  Please go to the XRAY Department in the first floor for the x-ray testing  Please go to the LAB at the blood drawing area for the tests to be done  You will be contacted by phone if any changes need to be made immediately.  Otherwise, you will receive a letter about your results with an explanation, but please check with MyChart first.  Please make an Appointment to return for your 1 year visit, or sooner if needed

## 2023-06-10 NOTE — Progress Notes (Signed)
Patient ID: [name], male   DOB: 1946-01-03, 78 y.o.   MRN: [id number]         Chief Complaint:: wellness exam and cough htn, hld, hyperglycemia       HPI:  [name] is a 78 y.o. male here for wellness exam; decliens covid booster, o/w up tod ate                        Also hx of covid infection with 1 mo cough in may 2024.  More recently with similar symptoms 2 wks ago but covid testing neg.  Was seen and tx per [facility] with augmentin, albuterol hfa prn, prednisone and tessalon perle prn.  No real improvement it seems, still coughing but at least non feverish.  Concerned about chf with hx of heart disease.  Pt denies chest pain, increased sob or doe, wheezing, orthopnea, PND, increased LE swelling, palpitations, dizziness or syncope.   Pt denies polydipsia, polyuria, or new focal neuro s/s.   Also asks for ENT and pulm referrals for sinus and persistent cough.   Wt Readings from Last 3 Encounters:  06/12/23 184 lb 12.8 oz (83.8 kg)  06/10/23 185 lb 9.6 oz (84.2 kg)  04/02/23 180 lb (81.6 kg)   BP Readings from Last 3 Encounters:  06/12/23 118/76  06/10/23 128/72  03/13/23 131/64   Immunization History  Administered Date(s) Administered   Fluad Quad(high Dose 65+) 02/18/2019, 02/21/2022   Influenza Split 03/21/2011, 03/03/2023   Influenza Whole 04/06/2009, 04/09/2010   Influenza, High Dose Seasonal PF 04/20/2015, 04/15/2016, 02/21/2017   Influenza,inj,Quad PF,6+ Mos 03/24/2014   Influenza-Unspecified 02/26/2018, 02/18/2019, 02/26/2020   PFIZER(Purple Top)SARS-COV-2 Vaccination 05/27/2019, 06/17/2019, 02/15/2020   Pfizer Covid-19 Vaccine Bivalent Booster 10yrs & up 02/21/2022   Pneumococcal Conjugate-13 11/29/2014   Pneumococcal Polysaccharide-23 06/25/2018   Respiratory Syncytial Virus Vaccine,Recomb Aduvanted(Arexvy) 02/24/2023   Rsv, Mab, Judi Cong, 0.5 Ml, Neonate To 24 Mos(Beyfortus) 02/21/2022   Tdap 11/29/2014   Unspecified SARS-COV-2 Vaccination  02/03/2022   Zoster Recombinant(Shingrix) 06/07/2018, 09/26/2018   Zoster, Live 05/20/2006   Health Maintenance Due  Topic Date Due   COVID-19 Vaccine (5 - 2024-25 season) 01/19/2023      Past Medical History:  Diagnosis Date   AICD (automatic cardioverter/defibrillator) present    Allergic rhinitis    Allergy    Anginal pain (HCC) 05/26/14   chest pain after chasing dog   Atypical nevus of back 04/27/2003   moderate - mid lower back   Basal cell carcinoma 01/26/2008   right cheek - MOHs   CAD (coronary artery disease)    hx of stent- 2005 RCA   Cataract    beginning stage both eyes   CHF (congestive heart failure) (HCC)    pacemaker Medtronic     Chronic back pain    intermittent   Constipation    Dizziness    Dysrhythmia    right bundle branch block    Esophageal stricture    GERD (gastroesophageal reflux disease)    Hemoptysis    Hiatal hernia    History of colonic polyps    hyperplastic   HTN (hypertension)    Hyperlipidemia    Hypertrophy of prostate with urinary obstruction and other lower urinary tract symptoms (LUTS)    OA (osteoarthritis)    OSA (obstructive sleep apnea)    cpap- 10    Other specified disorder of stomach and duodenum    duodenal periampulary tubulovillous adenoma removed  by Dr. Christella Hartigan 5/10   Pericarditis 07/06/2019   Pneumonia    Pre-diabetes    no medications   Shortness of breath    with exertion   Sleep apnea    cpap   Testicular hypofunction    Past Surgical History:  Procedure Laterality Date   BILIARY STENT PLACEMENT N/A 03/19/2021   Procedure: BILIARY STENT PLACEMENT;  Surgeon: Lemar Lofty., MD;  Location: [facility];  Service: Gastroenterology;  Laterality: N/A;   BIV ICD INSERTION CRT-D N/A 03/27/2017   Procedure: BIV ICD INSERTION CRT-D;  Surgeon: Regan Lemming, MD;  Location: [facility] INVASIVE CV LAB;  Service: Cardiovascular;  Laterality: N/A;   CARDIAC CATHETERIZATION     '05, last 2009, showing patent  RCA stent   COLONOSCOPY  12/2007   HYPERPLASTIC POLYP   COLONOSCOPY  12/2019   COLONOSCOPY WITH PROPOFOL N/A 06/02/2014   Procedure: COLONOSCOPY WITH PROPOFOL;  Surgeon: Rachael Fee, MD;  Location: [facility] END[facility];  Service: Endoscopy;  Laterality: N/A;   CORONARY ANGIOPLASTY  08/2003   ENDOSCOPIC MUCOSAL RESECTION  03/19/2021   Procedure: ENDOSCOPIC MUCOSAL RESECTION, ampullectomy;  Surgeon: Lemar Lofty., MD;  Location: [facility];  Service: Gastroenterology;;   ENDOSCOPIC RETROGRADE CHOLANGIOPANCREATOGRAPHY (ERCP) WITH PROPOFOL N/A 03/19/2021   Procedure: ENDOSCOPIC RETROGRADE CHOLANGIOPANCREATOGRAPHY (ERCP) WITH PROPOFOL;  Surgeon: Lemar Lofty., MD;  Location: [facility];  Service: Gastroenterology;  Laterality: N/A;   ENDOSCOPIC RETROGRADE CHOLANGIOPANCREATOGRAPHY (ERCP) WITH PROPOFOL N/A 11/29/2021   Procedure: ENDOSCOPIC RETROGRADE CHOLANGIOPANCREATOGRAPHY (ERCP) WITH PROPOFOL;  Surgeon: Meridee Score Netty Starring., MD;  Location: [facility] ENDOSCOPY;  Service: Gastroenterology;  Laterality: N/A;   ESOPHAGOGASTRODUODENOSCOPY (EGD) WITH PROPOFOL N/A 06/02/2014   Procedure: ESOPHAGOGASTRODUODENOSCOPY (EGD) WITH PROPOFOL;  Surgeon: Rachael Fee, MD;  Location: [facility];  Service: Endoscopy;  Laterality: N/A;   ESOPHAGOGASTRODUODENOSCOPY (EGD) WITH PROPOFOL N/A 03/19/2021   Procedure: ESOPHAGOGASTRODUODENOSCOPY (EGD) WITH PROPOFOL;  Surgeon: Meridee Score Netty Starring., MD;  Location: [facility];  Service: Gastroenterology;  Laterality: N/A;   ESOPHAGOGASTRODUODENOSCOPY (EGD) WITH PROPOFOL N/A 03/23/2021   Procedure: ESOPHAGOGASTRODUODENOSCOPY (EGD) WITH PROPOFOL;  Surgeon: Meryl Dare, MD;  Location: [facility];  Service: Endoscopy;  Laterality: N/A;   ESOPHAGOGASTRODUODENOSCOPY (EGD) WITH PROPOFOL N/A 03/13/2023   Procedure: ESOPHAGOGASTRODUODENOSCOPY (EGD) WITH PROPOFOL;  Surgeon: Meridee Score Netty Starring., MD;  Location: [facility];  Service: Gastroenterology;   Laterality: N/A;  WITH SIDE SCOPE   hernia surgery x 3     Bilateral Inguinal, Umbicial   INSERTION OF MESH N/A 03/20/2018   Procedure: INSERTION OF MESH;  Surgeon: Ovidio Kin, MD;  Location: [facility] OR;  Service: General;  Laterality: N/A;   IRRIGATION AND DEBRIDEMENT ABSCESS Left 08/14/2012   Procedure: IRRIGATION AND DEBRIDEMENT LEFT INGUINAL BOIL ;  Surgeon: Kathi Ludwig, MD;  Location: [facility] ORS;  Service: Urology;  Laterality: Left;   JOINT REPLACEMENT Right 2017   KNEE ARTHROPLASTY     LEFT HEART CATH AND CORONARY ANGIOGRAPHY N/A 01/24/2019   Procedure: LEFT HEART CATH AND CORONARY ANGIOGRAPHY;  Surgeon: Swaziland, Peter M, MD;  Location: [facility] INVASIVE CV LAB;  Service: Cardiovascular;  Laterality: N/A;   LEFT HEART CATHETERIZATION WITH CORONARY ANGIOGRAM N/A 05/26/2014   Procedure: LEFT HEART CATHETERIZATION WITH CORONARY ANGIOGRAM;  Surgeon: Micheline Chapman, MD;  Location: [facility] CATH LAB;  Service: Cardiovascular;  Laterality: N/A;   LUMBAR LAMINECTOMY/DECOMPRESSION MICRODISCECTOMY Left 09/26/2014   Procedure: Lumbar Laminectomy for resection of synovial cyst  Lumbar five- sacral one left;  Surgeon: Donalee Citrin, MD;  Location: [facility] NEURO ORS;  Service:  Neurosurgery;  Laterality: Left;   MOLE REMOVAL Left 03/20/2018   Procedure: MOLE REMOVAL;  Surgeon: Ovidio Kin, MD;  Location: [facility] OR;  Service: General;  Laterality: Left;   Oral surg to removed growth from ?sinus  10/2010   ?Dermoid removed by DrRiggs   PACEMAKER INSERTION  04/03/2017   PANCREATIC STENT PLACEMENT  03/19/2021   Procedure: PANCREATIC STENT PLACEMENT;  Surgeon: Lemar Lofty., MD;  Location: [facility] ENDOS[facility];  Service: Gastroenterology;;   POLYPECTOMY     RCA stenting     '05 RCA   REMOVAL OF STONES  11/29/2021   Procedure: REMOVAL OF SLUDGE;  Surgeon: Meridee Score Netty Starring., MD;  Location: [facility] ENDOSCOPY;  Service: Gastroenterology;;   right toe surgery  Right    Cyst    RIGHT/LEFT HEART CATH AND CORONARY ANGIOGRAPHY N/A  01/15/2017   Procedure: RIGHT/LEFT HEART CATH AND CORONARY ANGIOGRAPHY;  Surgeon: Tonny Bollman, MD;  Location: [facility] INVASIVE CV LAB;  Service: Cardiovascular;  Laterality: N/A;   s/p right knee arthroscopy  2005   SPHINCTEROTOMY  03/19/2021   Procedure: SPHINCTEROTOMY;  Surgeon: Mansouraty, Netty Starring., MD;  Location: [facility];  Service: Gastroenterology;;   Francine Graven REMOVAL  11/29/2021   Procedure: STENT REMOVAL;  Surgeon: Lemar Lofty., MD;  Location: [facility] ENDOSCOPY;  Service: Gastroenterology;;   SUBMUCOSAL LIFTING INJECTION  03/19/2021   Procedure: SUBMUCOSAL LIFTING INJECTION;  Surgeon: Lemar Lofty., MD;  Location: [facility];  Service: Gastroenterology;;   thumb surgery Right    TOTAL KNEE ARTHROPLASTY Right 03/12/2016   Procedure: RIGHT TOTAL KNEE ARTHROPLASTY;  Surgeon: Durene Romans, MD;  Location: [facility] ORS;  Service: Orthopedics;  Laterality: Right;   TOTAL KNEE ARTHROPLASTY Left 12/10/2021   Procedure: TOTAL KNEE ARTHROPLASTY;  Surgeon: Ollen Gross, MD;  Location: [facility];  Service: Orthopedics;  Laterality: Left;   UPPER ESOPHAGEAL ENDOSCOPIC ULTRASOUND (EUS) N/A 03/19/2021   Procedure: UPPER ESOPHAGEAL ENDOSCOPIC ULTRASOUND (EUS);  Surgeon: Lemar Lofty., MD;  Location: [facility];  Service: Gastroenterology;  Laterality: N/A;   UPPER GASTROINTESTINAL ENDOSCOPY     VENTRAL HERNIA REPAIR N/A 03/20/2018   Procedure: LAPAROSCOPIC VENTRAL INCISIONAL  HERNIA ERAS PATHWAY;  Surgeon: Ovidio Kin, MD;  Location: [facility] OR;  Service: General;  Laterality: N/A;    reports that he quit smoking about 33 years ago. His smoking use included cigarettes. He has never been exposed to tobacco smoke. He has never used smokeless tobacco. He reports current alcohol use. He reports that he does not use drugs. family history includes Arthritis in his brother and daughter; COPD in his brother; Cancer in his brother; Coronary artery disease in his father; Diabetes in his father;  Heart disease in his father and mother; Heart failure in his brother; Hypertension in an other family member; Stomach cancer in his paternal grandmother; Sudden death in his father. Allergies  Allergen Reactions   Sulfonamide Derivatives Rash        Current Outpatient Medications on File Prior to Visit  Medication Sig Dispense Refill   acetaminophen (TYLENOL) 650 MG CR tablet Take 650 mg by mouth in the morning and at bedtime.     albuterol (VENTOLIN HFA) 108 (90 Base) MCG/ACT inhaler Inhale 2 puffs into the lungs every 6 (six) hours as needed for wheezing or shortness of breath. 8 g 0   amoxicillin-clavulanate (AUGMENTIN) 875-125 MG tablet Take 1 tablet by mouth 2 (two) times daily. 20 tablet 0   atorvastatin (LIPITOR) 20 MG tablet TAKE 1 TABLET BY MOUTH DAILY 90 tablet  2   b complex vitamins tablet Take 1 tablet by mouth daily with lunch.     benzonatate (TESSALON) 100 MG capsule Take 1-2 capsules (100-200 mg total) by mouth 3 (three) times daily as needed. 30 capsule 0   betamethasone dipropionate 0.05 % cream      carvedilol (COREG) 25 MG tablet TAKE 1 TABLET BY MOUTH TWICE DAILY 180 tablet 3   cholecalciferol (VITAMIN D3) 25 MCG (1000 UNIT) tablet Take 1,000 Units by mouth daily.     Coenzyme Q10 (CO Q 10 PO) Take 200 mg by mouth daily with lunch.     ELIQUIS 5 MG TABS tablet TAKE 1 TABLET(5 MG) BY MOUTH TWICE DAILY 180 tablet 1   Flaxseed, Linseed, (FLAXSEED OIL PO) Take 1 capsule by mouth daily. With Omega 3     fluocinonide (LIDEX) 0.05 % external solution      folic acid (FOLVITE) 400 MCG tablet Take 400 mcg by mouth daily.     Glucosamine HCl (GLUCOSAMINE PO) Take 1,500 mg by mouth every evening.     hydrocortisone 2.5 % cream Apply 1 application topically 2 (two) times daily as needed (rash on nose).      hydroxychloroquine (PLAQUENIL) 200 MG tablet TAKE 1 TABLET BY MOUTH EVERY DAY ON MONDAY TO FRIDAY ONLY. DO NOT TAKE ON SATURDAY OR SUNDAY. 60 tablet 0   Lutein 20 MG TABS Take 20  mg by mouth daily.     Multiple Vitamin (MULTIVITAMIN) capsule Take 1 capsule by mouth daily.       nitroGLYCERIN (NITROSTAT) 0.4 MG SL tablet Dissolve 1 tablet under the tongue every 5 minutes as needed for chest pain. Max of 3 doses, then 911. 25 tablet 6   NON FORMULARY CPAP machine with sleep.     pantoprazole (PROTONIX) 40 MG tablet Take 1 tablet (40 mg total) by mouth daily. before breakfast 90 tablet 3   polyethylene glycol (MIRALAX / GLYCOLAX) 17 g packet Take 17 g by mouth daily as needed for mild constipation. 14 each 0   predniSONE (DELTASONE) 20 MG tablet Take 2 tablets (40 mg total) by mouth daily with breakfast. 10 tablet 0   Probiotic Product (PROBIOTIC DAILY PO) Take 1 capsule by mouth daily with lunch.      sacubitril-valsartan (ENTRESTO) 49-51 MG Take 1 tablet by mouth 2 (two) times daily. 180 tablet 3   Saw Palmetto, Serenoa repens, (SAW PALMETTO PO) Take 450 mg by mouth every evening.     spironolactone (ALDACTONE) 25 MG tablet Take 1 tablet (25 mg total) by mouth daily. 45 tablet 1   tamsulosin (FLOMAX) 0.4 MG CAPS capsule Take 0.4 mg by mouth daily.     Testosterone 20 % CREA Apply 2 mLs topically daily. Rub on shoulder     tobramycin-dexamethasone (TOBRADEX) ophthalmic ointment APPLY A SMALL AMOUNT ONTO ITCHING EYELID AREA 3X/DAY FOR 1 WEEK THEN DAILY FOR 1 WEEK THEN EVERY OTHER DAY FOR 2 WEEKS.     traMADol (ULTRAM) 50 MG tablet Take 1 tablet (50 mg total) by mouth at bedtime as needed. 30 tablet 0   TURMERIC PO Take 1 tablet by mouth daily with lunch.      vitamin C (ASCORBIC ACID) 500 MG tablet Take 500 mg by mouth daily.     zinc gluconate 50 MG tablet Take 50 mg by mouth daily.     RESTASIS 0.05 % ophthalmic emulsion Place 1 drop into both eyes 2 (two) times daily as needed (dry eyes).  No current facility-administered medications on file prior to visit.        ROS:  All others reviewed and negative.  Objective        PE:  BP 128/72   Pulse 71   Temp 98.1 F  (36.7 C) (Oral)   Resp 18   Ht 5\' 7"  (1.702 m)   Wt 185 lb 9.6 oz (84.2 kg)   SpO2 97%   BMI 29.07 kg/m                 Constitutional: Pt appears in NAD               HENT: Head: NCAT.                Right Ear: External ear normal.                 Left Ear: External ear normal.                Eyes: . Pupils are equal, round, and reactive to light. Conjunctivae and EOM are normal               Nose: without d/c or deformity               Neck: Neck supple. Gross normal ROM               Cardiovascular: Normal rate and regular rhythm.                 Pulmonary/Chest: Effort normal and breath sounds without rales or wheezing.                Abd:  Soft, NT, ND, + BS, no organomegaly               Neurological: Pt is alert. At baseline orientation, motor grossly intact               Skin: Skin is warm. No rashes, no other new lesions, LE edema - none               Psychiatric: Pt behavior is normal without agitation   Micro: none  Cardiac tracings I have personally interpreted today:  none  Pertinent Radiological findings (summarize): none   Lab Results  Component Value Date   WBC 11.3 (H) 06/10/2023   HGB 15.0 06/10/2023   HCT 44.3 06/10/2023   PLT 313.0 06/10/2023   GLUCOSE 95 06/10/2023   CHOL 135 06/10/2023   TRIG 345.0 (H) 06/10/2023   HDL 31.60 (L) 06/10/2023   LDLDIRECT 75.0 09/27/2021   LDLCALC 34 06/10/2023   ALT 20 06/10/2023   AST 18 06/10/2023   NA 139 06/10/2023   K 3.8 06/10/2023   CL 101 06/10/2023   CREATININE 0.89 06/10/2023   BUN 23 06/10/2023   CO2 30 06/10/2023   TSH 3.39 06/10/2023   PSA 4.24 (H) 06/10/2023   INR 1.1 03/23/2021   HGBA1C 6.6 (H) 06/10/2023   MICROALBUR <0.7 06/10/2023   Assessment/Plan:  [name] is a 78 y.o. White or Caucasian [1] male with  has a past medical history of AICD (automatic cardioverter/defibrillator) present, Allergic rhinitis, Allergy, Anginal pain (HCC) (05/26/14), Atypical nevus of back (04/27/2003), Basal  cell carcinoma (01/26/2008), CAD (coronary artery disease), Cataract, CHF (congestive heart failure) (HCC), Chronic back pain, Constipation, Dizziness, Dysrhythmia, Esophageal stricture, GERD (gastroesophageal reflux disease), Hemoptysis, Hiatal hernia, History of colonic polyps, HTN (hypertension), Hyperlipidemia, Hypertrophy of prostate with urinary obstruction  and other lower urinary tract symptoms (LUTS), OA (osteoarthritis), OSA (obstructive sleep apnea), Other specified disorder of stomach and duodenum, Pericarditis (07/06/2019), Pneumonia, Pre-diabetes, Shortness of breath, Sleep apnea, and Testicular hypofunction.  Encounter for well adult exam with abnormal findings Age and sex appropriate education and counseling updated with regular exercise and diet Referrals for preventative services - none needed Immunizations addressed - decliens covid bosoter Smoking counseling  - none needed Evidence for depression or other mood disorder - none significant Most recent labs reviewed. I have personally reviewed and have noted: 1) the patient's medical and social history 2) The patient's current medications and supplements 3) The patient's height, weight, and BMI have been recorded in the chart   Essential hypertension BP Readings from Last 3 Encounters:  06/12/23 118/76  06/10/23 128/72  03/13/23 131/64   Stable, pt to continue medical treatment coreg 25 bid   Hyperlipidemia Lab Results  Component Value Date   LDLCALC 34 06/10/2023   Stable, pt to continue current statin lipitor 20 qd   Prediabetes Lab Results  Component Value Date   HGBA1C 6.6 (H) 06/10/2023   Mild uncontrolled pt to continue current medical treatment diet wt control as declines other change for now   PSA elevation Asympt, mild, but for urology referral  Chronic sinusitis Also for ENT referral per pt reqeust  Cough For cxr, bnp, cough med prn  Vitamin D deficiency Last vitamin D Lab Results   Component Value Date   VD25OH 28.36 (L) 06/10/2023   Low, to start oral replacement  Followup: Return in about 1 year (around 06/09/2024).  Oliver Barre, MD 06/13/2023 9:26 PM [facility]

## 2023-06-12 ENCOUNTER — Ambulatory Visit: Payer: Medicare Other | Attending: Cardiovascular Disease | Admitting: Cardiovascular Disease

## 2023-06-12 ENCOUNTER — Encounter: Payer: Self-pay | Admitting: Cardiovascular Disease

## 2023-06-12 VITALS — BP 118/76 | HR 75 | Ht 67.0 in | Wt 184.8 lb

## 2023-06-12 DIAGNOSIS — E782 Mixed hyperlipidemia: Secondary | ICD-10-CM | POA: Diagnosis not present

## 2023-06-12 DIAGNOSIS — I5022 Chronic systolic (congestive) heart failure: Secondary | ICD-10-CM

## 2023-06-12 DIAGNOSIS — J329 Chronic sinusitis, unspecified: Secondary | ICD-10-CM | POA: Insufficient documentation

## 2023-06-12 DIAGNOSIS — I2510 Atherosclerotic heart disease of native coronary artery without angina pectoris: Secondary | ICD-10-CM

## 2023-06-12 DIAGNOSIS — I480 Paroxysmal atrial fibrillation: Secondary | ICD-10-CM

## 2023-06-12 NOTE — Progress Notes (Signed)
Cardiology Office Note:    Date:  06/13/2023   ID:  [name], DOB 1946-01-12, MRN [id number]  PCP:  Corwin Levins, MD   [facility] Providers Cardiologist:  Tonny Bollman, MD Electrophysiologist:  Regan Lemming, MD     Referring MD: Corwin Levins, MD   Chief Complaint  Patient presents with   Coronary Artery Disease    History of Present Illness:    [name] is a 78 y.o. male with a hx of chronic systolic heart failure with left bundle branch block, coronary artery disease with remote stenting of the RCA, and paroxysmal atrial fibrillation.  He presents for follow-up evaluation today.  He underwent CRT-D implantation in 2018.  He was ultimately found to have atrial fibrillation on remote device monitoring and was started on anticoagulation with apixaban.  His antiplatelet therapy was stopped at that time.  LV function normalized after cardiac resynchronization.   The patient is here alone today.  He has been doing pretty well from a cardiac perspective.  He denies any chest pain or pressure.  He has exertional dyspnea that is unchanged.  He has intermittent orthopnea but no PND.  No leg swelling.  No heart palpitations.  He is compliant with his medications.  Current Medications: Current Meds  Medication Sig   acetaminophen (TYLENOL) 650 MG CR tablet Take 650 mg by mouth in the morning and at bedtime.   albuterol (VENTOLIN HFA) 108 (90 Base) MCG/ACT inhaler Inhale 2 puffs into the lungs every 6 (six) hours as needed for wheezing or shortness of breath.   amoxicillin-clavulanate (AUGMENTIN) 875-125 MG tablet Take 1 tablet by mouth 2 (two) times daily.   atorvastatin (LIPITOR) 20 MG tablet TAKE 1 TABLET BY MOUTH DAILY   b complex vitamins tablet Take 1 tablet by mouth daily with lunch.   benzonatate (TESSALON) 100 MG capsule Take 1-2 capsules (100-200 mg total) by mouth 3 (three) times daily as needed.   betamethasone dipropionate 0.05 % cream     carvedilol (COREG) 25 MG tablet TAKE 1 TABLET BY MOUTH TWICE DAILY   cholecalciferol (VITAMIN D3) 25 MCG (1000 UNIT) tablet Take 1,000 Units by mouth daily.   Coenzyme Q10 (CO Q 10 PO) Take 200 mg by mouth daily with lunch.   ELIQUIS 5 MG TABS tablet TAKE 1 TABLET(5 MG) BY MOUTH TWICE DAILY   Flaxseed, Linseed, (FLAXSEED OIL PO) Take 1 capsule by mouth daily. With Omega 3   fluocinonide (LIDEX) 0.05 % external solution    folic acid (FOLVITE) 400 MCG tablet Take 400 mcg by mouth daily.   Glucosamine HCl (GLUCOSAMINE PO) Take 1,500 mg by mouth every evening.   HYDROcodone bit-homatropine (HYCODAN) 5-1.5 MG/5ML syrup Take 5 mLs by mouth every 6 (six) hours as needed for up to 10 days.   hydrocortisone 2.5 % cream Apply 1 application topically 2 (two) times daily as needed (rash on nose).    hydroxychloroquine (PLAQUENIL) 200 MG tablet TAKE 1 TABLET BY MOUTH EVERY DAY ON MONDAY TO FRIDAY ONLY. DO NOT TAKE ON SATURDAY OR SUNDAY.   Lutein 20 MG TABS Take 20 mg by mouth daily.   Multiple Vitamin (MULTIVITAMIN) capsule Take 1 capsule by mouth daily.     nitroGLYCERIN (NITROSTAT) 0.4 MG SL tablet Dissolve 1 tablet under the tongue every 5 minutes as needed for chest pain. Max of 3 doses, then 911.   NON FORMULARY CPAP machine with sleep.   pantoprazole (PROTONIX) 40 MG tablet Take  1 tablet (40 mg total) by mouth daily. before breakfast   polyethylene glycol (MIRALAX / GLYCOLAX) 17 g packet Take 17 g by mouth daily as needed for mild constipation.   predniSONE (DELTASONE) 20 MG tablet Take 2 tablets (40 mg total) by mouth daily with breakfast.   Probiotic Product (PROBIOTIC DAILY PO) Take 1 capsule by mouth daily with lunch.    RESTASIS 0.05 % ophthalmic emulsion Place 1 drop into both eyes 2 (two) times daily as needed (dry eyes).   sacubitril-valsartan (ENTRESTO) 49-51 MG Take 1 tablet by mouth 2 (two) times daily.   Saw Palmetto, Serenoa repens, (SAW PALMETTO PO) Take 450 mg by mouth every evening.    spironolactone (ALDACTONE) 25 MG tablet Take 1 tablet (25 mg total) by mouth daily.   tamsulosin (FLOMAX) 0.4 MG CAPS capsule Take 0.4 mg by mouth daily.   Testosterone 20 % CREA Apply 2 mLs topically daily. Rub on shoulder   tobramycin-dexamethasone (TOBRADEX) ophthalmic ointment APPLY A SMALL AMOUNT ONTO ITCHING EYELID AREA 3X/DAY FOR 1 WEEK THEN DAILY FOR 1 WEEK THEN EVERY OTHER DAY FOR 2 WEEKS.   traMADol (ULTRAM) 50 MG tablet Take 1 tablet (50 mg total) by mouth at bedtime as needed.   TURMERIC PO Take 1 tablet by mouth daily with lunch.    vitamin C (ASCORBIC ACID) 500 MG tablet Take 500 mg by mouth daily.   zinc gluconate 50 MG tablet Take 50 mg by mouth daily.     Allergies:   Sulfonamide derivatives   ROS:   Please see the history of present illness.    All other systems reviewed and are negative.  EKGs/Labs/Other Studies Reviewed:    The following studies were reviewed today: Cardiac Studies & Procedures   CARDIAC CATHETERIZATION  CARDIAC CATHETERIZATION 01/24/2019  Narrative  Mid LAD lesion is 30% stenosed.  Non-stenotic Mid RCA lesion was previously treated.  The left ventricular systolic function is normal.  LV end diastolic pressure is normal.  The left ventricular ejection fraction is 55-65% by visual estimate.  1. Nonobstructive CAD. Continued patency of the stent in the RCA 2. Good LV systolic function 3. Normal LVEDP  Plan: will obtain an Echo. Consider a trial of colchicine. Resume Eliquis tomorrow am.  Findings Coronary Findings Diagnostic  Dominance: Right  Left Anterior Descending The vessel exhibits minimal luminal irregularities. Mid LAD lesion is 30% stenosed.  Left Circumflex The vessel exhibits minimal luminal irregularities.  Right Coronary Artery The vessel exhibits minimal luminal irregularities. Non-stenotic Mid RCA lesion was previously treated.  Intervention  No interventions have been documented.   CARDIAC  CATHETERIZATION  CARDIAC CATHETERIZATION 01/15/2017  Narrative 1. Widely patent coronary arteries with continued patency of the stented segment in the right coronary artery 2. Mild nonobstructive coronary artery disease as detailed with mild stenosis of the proximal to mid LAD and otherwise minimal luminal irregularities 3. Well compensated right-sided cardiac hemodynamics  Recommendation: Aggressive medical therapy. Refer to EP as an outpatient for consideration of CRT-D.  Findings Coronary Findings Diagnostic  Dominance: Right  Left Anterior Descending The vessel exhibits minimal luminal irregularities.  Left Circumflex The vessel exhibits minimal luminal irregularities.  Right Coronary Artery The vessel exhibits minimal luminal irregularities. Previously placed Mid RCA drug eluting stent is widely patent.  Intervention  No interventions have been documented.   STRESS TESTS  MYOCARDIAL PERFUSION IMAGING 12/12/2022  Narrative   The study shows normal perfusion. The study is intermediate risk due to mildly reduced EF on study (  appears globally hypokinetic and worse in the inferior segments; does not appear to be related to V-pacing). Recommend correlation with echo for better assessment.   No ST deviation was noted.   LV perfusion is normal. There is no evidence of ischemia.   Left ventricular function is abnormal. Nuclear stress EF: 43%. The left ventricular ejection fraction is moderately decreased (30-44%). End diastolic cavity size is normal. End systolic cavity size is normal.   Prior study not available for comparison.  Laurance Flatten, MD  ECHOCARDIOGRAM  ECHOCARDIOGRAM COMPLETE 07/02/2022  Narrative ECHOCARDIOGRAM REPORT    Patient Name:   CHARISTOPHER RUMBLE Date of Exam: 07/02/2022 Medical Rec #:  [id number]      Height:       67.0 in Accession #:    [id number]     Weight:       191.0 lb Date of Birth:  [birth year]     BSA:          1.983 m Patient Age:    68  years       BP:           110/70 mmHg Patient Gender: M              HR:           75 bpm. Exam Location:  [address]  Procedure: 2D Echo, Cardiac Doppler and Color Doppler  Indications:    I50.22 CHF  History:        Patient has prior history of Echocardiogram examinations, most recent 06/22/2020. CHF, CAD, ICD, Arrythmias:Atrial Fibrillation and LBBB; Risk Factors:Hypertension and Dyslipidemia.  Sonographer:    Samule Ohm RDCS Referring Phys: [phone] Nussen Pullin   Sonographer Comments: Technically difficult study due to poor echo windows. IMPRESSIONS   1. Left ventricular ejection fraction, by estimation, is 50 to 55%. The left ventricle has low normal function. The left ventricle has no regional wall motion abnormalities. There is moderate concentric left ventricular hypertrophy. Left ventricular diastolic parameters are consistent with Grade I diastolic dysfunction (impaired relaxation). 2. Right ventricular systolic function is normal. The right ventricular size is normal. There is normal pulmonary artery systolic pressure. 3. Left atrial size was moderately dilated. 4. The mitral valve is normal in structure. Trivial mitral valve regurgitation. No evidence of mitral stenosis. 5. The aortic valve is tricuspid. Aortic valve regurgitation is trivial. No aortic stenosis is present. 6. The inferior vena cava is normal in size with greater than 50% respiratory variability, suggesting right atrial pressure of 3 mmHg.  FINDINGS Left Ventricle: Left ventricular ejection fraction, by estimation, is 50 to 55%. The left ventricle has low normal function. The left ventricle has no regional wall motion abnormalities. The left ventricular internal cavity size was normal in size. There is moderate concentric left ventricular hypertrophy. Left ventricular diastolic parameters are consistent with Grade I diastolic dysfunction (impaired relaxation). Indeterminate filling pressures.  Right  Ventricle: The right ventricular size is normal. No increase in right ventricular wall thickness. Right ventricular systolic function is normal. There is normal pulmonary artery systolic pressure. The tricuspid regurgitant velocity is 2.50 m/s, and with an assumed right atrial pressure of 3 mmHg, the estimated right ventricular systolic pressure is 28.0 mmHg.  Left Atrium: Left atrial size was moderately dilated.  Right Atrium: Right atrial size was normal in size.  Pericardium: There is no evidence of pericardial effusion.  Mitral Valve: The mitral valve is normal in structure. Trivial mitral valve regurgitation. No evidence of mitral valve  stenosis.  Tricuspid Valve: The tricuspid valve is normal in structure. Tricuspid valve regurgitation is mild . No evidence of tricuspid stenosis.  Aortic Valve: The aortic valve is tricuspid. Aortic valve regurgitation is trivial. Aortic regurgitation PHT measures 498 msec. No aortic stenosis is present.  Pulmonic Valve: The pulmonic valve was normal in structure. Pulmonic valve regurgitation is mild. No evidence of pulmonic stenosis.  Aorta: The aortic root is normal in size and structure.  Venous: The inferior vena cava is normal in size with greater than 50% respiratory variability, suggesting right atrial pressure of 3 mmHg.  IAS/Shunts: No atrial level shunt detected by color flow Doppler.   LEFT VENTRICLE PLAX 2D LVIDd:         5.40 cm   Diastology LVIDs:         3.70 cm   LV e' medial:    5.11 cm/s LV PW:         1.50 cm   LV E/e' medial:  12.6 LV IVS:        1.40 cm   LV e' lateral:   5.22 cm/s LVOT diam:     2.50 cm   LV E/e' lateral: 12.4 LV SV:         69 LV SV Index:   35 LVOT Area:     4.91 cm   RIGHT VENTRICLE            IVC RVSP:           28.0 mmHg  IVC diam: 1.10 cm  LEFT ATRIUM             Index        RIGHT ATRIUM           Index LA diam:        4.30 cm 2.17 cm/m   RA Pressure: 3.00 mmHg LA Vol (A2C):   74.3 ml 37.47  ml/m  RA Area:     13.20 cm LA Vol (A4C):   56.5 ml 28.49 ml/m  RA Volume:   28.30 ml  14.27 ml/m LA Biplane Vol: 66.5 ml 33.53 ml/m AORTIC VALVE LVOT Vmax:   67.10 cm/s LVOT Vmean:  45.600 cm/s LVOT VTI:    0.140 m AI PHT:      498 msec  AORTA Ao Root diam: 3.50 cm Ao Asc diam:  3.30 cm  MITRAL VALVE               TRICUSPID VALVE MV Area (PHT): 2.50 cm    TR Peak grad:   25.0 mmHg MV Decel Time: 304 msec    TR Vmax:        250.00 cm/s MV E velocity: 64.50 cm/s  Estimated RAP:  3.00 mmHg MV A velocity: 87.00 cm/s  RVSP:           28.0 mmHg MV E/A ratio:  0.74 SHUNTS Systemic VTI:  0.14 m Systemic Diam: 2.50 cm  Chilton Si MD Electronically signed by Chilton Si MD Signature Date/Time: 07/02/2022/1:27:59 PM    Final     CARDIAC MRI  MR CARDIAC MORPHOLOGY W WO CONTRAST 08/07/2011  Narrative Cardiac MRI:  Indication:  Assess EF History of CAD and poor acoustic windows on echo  Protocol:  The patient was scanned on a 1.5 Tesla GE magnet.  A dedicated cardiac coil was used.  Functional imaging was done using Fiesta sequences.  2,3 and 4 chamber views were done to assess RWMA;s.  Quantitiave EF was calculated using Circle  software on a dedicated work station.  The patient received 30cc of Multihance. After 10 minutes inversion recovery sequences were done to assess for infarct or scar  Findings: There was mild LVE.  There was mild LAE.  Right sided cardiac chambers were normal.  There was no VSD/ASD.  There was no pericardial effusion.  There was no discrete RWMA;s.  There were no areas of thinning. There was abnormal septal motion.  Quantitative EF was 48% ( EDV 148cc, ESV 76cc, SV 72 cc).  Hyperenhancement images showed no areas of scar or infarct  Impression:  1)    Mild LVE with abnormal septal motion EF 48%  2)    No infarct or scar on hyperenhancement images 3)    Mild LAE 4)    Normal right sided cardiac chambers  Charlton Haws MD  [facility]  Cc: Dr Tonny Bollman  Original Report Authenticated By: [email or web address]         EKG:        Recent Labs: 06/10/2023: ALT 20; BUN 23; Creatinine, Ser 0.89; Hemoglobin 15.0; Platelets 313.0; Potassium 3.8; Pro B Natriuretic peptide (BNP) 55.0; Sodium 139; TSH 3.39  Recent Lipid Panel    Component Value Date/Time   CHOL 135 06/10/2023 1408   CHOL 138 11/27/2016 0914   TRIG 345.0 (H) 06/10/2023 1408   HDL 31.60 (L) 06/10/2023 1408   HDL 35 (L) 11/27/2016 0914   CHOLHDL 4 06/10/2023 1408   VLDL 69.0 (H) 06/10/2023 1408   LDLCALC 34 06/10/2023 1408   LDLCALC 65 11/27/2016 0914   LDLDIRECT 75.0 09/27/2021 0815     Risk Assessment/Calculations:                Physical Exam:    VS:  BP 118/76   Pulse 75   Ht 5\' 7"  (1.702 m)   Wt 184 lb 12.8 oz (83.8 kg)   SpO2 95%   BMI 28.94 kg/m     Wt Readings from Last 3 Encounters:  06/12/23 184 lb 12.8 oz (83.8 kg)  06/10/23 185 lb 9.6 oz (84.2 kg)  04/02/23 180 lb (81.6 kg)     GEN:  Well nourished, well developed in no acute distress HEENT: Normal NECK: No JVD; No carotid bruits LYMPHATICS: No lymphadenopathy CARDIAC: RRR, no murmurs, rubs, gallops RESPIRATORY:  Clear to auscultation without rales, wheezing or rhonchi  ABDOMEN: Soft, non-tender, non-distended MUSCULOSKELETAL:  No edema; No deformity  SKIN: Warm and dry NEUROLOGIC:  Alert and oriented x 3 PSYCHIATRIC:  Normal affect   Assessment & Plan AF (paroxysmal atrial fibrillation) (HCC) The patient remains clinically stable with no symptoms of atrial fibrillation or flutter.  He is tolerating oral anticoagulation with Eliquis and reports no bleeding problems.  No changes are made in his medical regimen today. Mixed hyperlipidemia He continues on atorvastatin 20 mg daily.  Last lipids which were drawn recently showing LDL cholesterol of 34.  He notes that his triglycerides were elevated at 345.  They are normally elevated but last year his triglycerides were 175.   The patient notes that he was nonfasting this year when he had them drawn.  He will continue to work on lifestyle modification.  We discussed the risk of marked hypertriglyceridemia and that things like pancreatitis can occur when his triglycerides get above 500. Coronary artery disease involving native coronary artery of native heart without angina pectoris Patient is stable and not experience any anginal symptoms.  His chronic shortness of breath is unchanged.  Continue current medical  management.  No signs of ischemic symptoms at this point.  He had a stress Myoview scan in July 2024 that showed normal perfusion. Chronic systolic heart failure (HCC) LVEF at the time of last assessment was 50 to 55%.  He will continue on Entresto and carvedilol.            Medication Adjustments/Labs and Tests Ordered: Current medicines are reviewed at length with the patient today.  Concerns regarding medicines are outlined above.  No orders of the defined types were placed in this encounter.  No orders of the defined types were placed in this encounter.   Patient Instructions   Follow-Up: At [facility], you and your health needs are our priority.  As part of our continuing mission to provide you with exceptional heart care, we have created designated Provider Care Teams.  These Care Teams include your primary Cardiologist (physician) and Advanced Practice Providers (APPs -  Physician Assistants and Nurse Practitioners) who all work together to provide you with the care you need, when you need it.  We recommend signing up for the patient portal called "MyChart".  Sign up information is provided on this After Visit Summary.  MyChart is used to connect with patients for Virtual Visits (Telemedicine).  Patients are able to view lab/test results, encounter notes, upcoming appointments, etc.  Non-urgent messages can be sent to your provider as well.   To learn more about what you can do with  MyChart, go to [email or web address].    Your next appointment:   6 months  Provider:   Tonny Bollman, MD     Other Instructions   1st Floor: - Lobby - Registration  - Pharmacy  - Lab - Cafe  2nd Floor: - PV Lab - Diagnostic Testing (echo, CT, nuclear med)  3rd Floor: - Vacant  4th Floor: - TCTS (cardiothoracic surgery) - AFib Clinic - Structural Heart Clinic - Vascular Surgery  - Vascular Ultrasound  5th Floor: - HeartCare Cardiology (general and EP) - Clinical Pharmacy for coumadin, hypertension, lipid, weight-loss medications, and med management appointments    Valet parking services will be available as well.          Signed, Tonny Bollman, MD  06/13/2023 5:41 PM    [facility]

## 2023-06-12 NOTE — Patient Instructions (Addendum)
Medications: Stop Amlodipine Take Hydralazine if systolic blood pressure (top number) is greater than 180 mmhg  Call us if blood pressure is consistently greater than 150/90 mmhg  Follow-Up: At [facility], you and your health needs are our priority.  As part of our continuing mission to provide you with exceptional heart care, we have created designated Provider Care Teams.  These Care Teams include your primary Cardiologist (physician) and Advanced Practice Providers (APPs -  Physician Assistants and Nurse Practitioners) who all work together to provide you with the care you need, when you need it.  We recommend signing up for the patient portal called "MyChart".  Sign up information is provided on this After Visit Summary.  MyChart is used to connect with patients for Virtual Visits (Telemedicine).  Patients are able to view lab/test results, encounter notes, upcoming appointments, etc.  Non-urgent messages can be sent to your provider as well.   To learn more about what you can do with MyChart, go to [email or web address].    Your next appointment:   6 months  Provider:   Tonny Bollman, MD     Other Instructions   1st Floor: - Lobby - Registration  - Pharmacy  - Lab - Cafe  2nd Floor: - PV Lab - Diagnostic Testing (echo, CT, nuclear med)  3rd Floor: - Vacant  4th Floor: - TCTS (cardiothoracic surgery) - AFib Clinic - Structural Heart Clinic - Vascular Surgery  - Vascular Ultrasound  5th Floor: - HeartCare Cardiology (general and EP) - Clinical Pharmacy for coumadin, hypertension, lipid, weight-loss medications, and med management appointments    Valet parking services will be available as well.

## 2023-06-13 ENCOUNTER — Encounter: Payer: Self-pay | Admitting: Internal Medicine

## 2023-06-13 DIAGNOSIS — R972 Elevated prostate specific antigen [PSA]: Secondary | ICD-10-CM | POA: Insufficient documentation

## 2023-06-13 DIAGNOSIS — E559 Vitamin D deficiency, unspecified: Secondary | ICD-10-CM | POA: Insufficient documentation

## 2023-06-13 NOTE — Assessment & Plan Note (Signed)
Lab Results  Component Value Date   LDLCALC 34 06/10/2023   Stable, pt to continue current statin lipitor 20 qd

## 2023-06-13 NOTE — Assessment & Plan Note (Signed)
Patient is stable and not experience any anginal symptoms.  His chronic shortness of breath is unchanged.  Continue current medical management.  No signs of ischemic symptoms at this point.  He had a stress Myoview scan in July 2024 that showed normal perfusion.

## 2023-06-13 NOTE — Assessment & Plan Note (Signed)
Lab Results  Component Value Date   HGBA1C 6.6 (H) 06/10/2023   Mild uncontrolled pt to continue current medical treatment diet wt control as declines other change for now

## 2023-06-13 NOTE — Assessment & Plan Note (Signed)
BP Readings from Last 3 Encounters:  06/12/23 118/76  06/10/23 128/72  03/13/23 131/64   Stable, pt to continue medical treatment coreg 25 bid

## 2023-06-13 NOTE — Assessment & Plan Note (Signed)
Last vitamin D Lab Results  Component Value Date   VD25OH 28.36 (L) 06/10/2023   Low, to start oral replacement

## 2023-06-13 NOTE — Assessment & Plan Note (Signed)
LVEF at the time of last assessment was 50 to 55%.  He will continue on Entresto and carvedilol.

## 2023-06-13 NOTE — Assessment & Plan Note (Signed)
Asympt, mild, but for urology referral

## 2023-06-13 NOTE — Assessment & Plan Note (Signed)
Also for ENT referral per pt reqeust

## 2023-06-13 NOTE — Assessment & Plan Note (Signed)
For cxr, bnp, cough med prn

## 2023-06-13 NOTE — Assessment & Plan Note (Signed)
The patient remains clinically stable with no symptoms of atrial fibrillation or flutter.  He is tolerating oral anticoagulation with Eliquis and reports no bleeding problems.  No changes are made in his medical regimen today.

## 2023-06-13 NOTE — Assessment & Plan Note (Signed)
Age and sex appropriate education and counseling updated with regular exercise and diet ?Referrals for preventative services - none needed ?Immunizations addressed - decliens covid bosoter ?Smoking counseling  - none needed ?Evidence for depression or other mood disorder - none significant ?Most recent labs reviewed. ?I have personally reviewed and have noted: ?1) the patient's medical and social history ?2) The patient's current medications and supplements ?3) The patient's height, weight, and BMI have been recorded in the chart ?

## 2023-06-13 NOTE — Assessment & Plan Note (Signed)
He continues on atorvastatin 20 mg daily.  Last lipids which were drawn recently showing LDL cholesterol of 34.  He notes that his triglycerides were elevated at 345.  They are normally elevated but last year his triglycerides were 175.  The patient notes that he was nonfasting this year when he had them drawn.  He will continue to work on lifestyle modification.  We discussed the risk of marked hypertriglyceridemia and that things like pancreatitis can occur when his triglycerides get above 500.

## 2023-06-18 NOTE — Progress Notes (Signed)
Office Visit Note  Patient: [name]             Date of Birth: 1945/05/31           MRN: [id number]             PCP: Corwin Levins, MD Referring: Corwin Levins, MD Visit Date: 07/01/2023 Occupation: @GUAROCC @  Subjective:  Medication management  History of Present Illness: [name] is a 78 y.o. male with seropositive rheumatoid arthritis, osteoarthritis and degenerative disc disease.  He states he continues to have pain and discomfort in all of his joints.  He complains of discomfort in his bilateral hands, knee joints, feet and his lower back.  He continues to have some left SI joint discomfort and left trochanteric bursa pain.  He states he is very stiff in the morning which gets better as he moves around.  He had upper respiratory tract infection in December and still has some residual cough from it.    Activities of Daily Living:  Patient reports morning stiffness for 5-10 minutes.   Patient Reports nocturnal pain.  Difficulty dressing/grooming: Denies Difficulty climbing stairs: Reports Difficulty getting out of chair: Reports Difficulty using hands for taps, buttons, cutlery, and/or writing: Reports  Review of Systems  Constitutional:  Negative for fatigue.  HENT:  Positive for mouth dryness and nose dryness. Negative for mouth sores.   Eyes:  Positive for pain and dryness.  Respiratory:  Positive for shortness of breath and difficulty breathing.   Cardiovascular:  Positive for chest pain and palpitations.  Gastrointestinal:  Negative for blood in stool, constipation and diarrhea.  Endocrine: Negative for increased urination.  Genitourinary:  Negative for involuntary urination.  Musculoskeletal:  Positive for joint pain, joint pain, joint swelling, myalgias, muscle weakness, morning stiffness, muscle tenderness and myalgias. Negative for gait problem.  Skin:  Negative for color change, rash and sensitivity to sunlight.  Allergic/Immunologic: Negative for  susceptible to infections.  Neurological:  Negative for dizziness and headaches.  Hematological:  Negative for swollen glands.  Psychiatric/Behavioral:  Negative for depressed mood and sleep disturbance. The patient is not nervous/anxious.     PMFS History:  Patient Active Problem List   Diagnosis Date Noted   Vitamin D deficiency 06/13/2023   PSA elevation 06/13/2023   Chronic sinusitis 06/12/2023   Cough 06/10/2023   Adenomatous duodenal polyp 03/13/2023   COVID 09/18/2022   Rupture of tendon of biceps, long head 07/11/2022   OSA (obstructive sleep apnea) 05/30/2022   Rhinorrhea 05/30/2022   Travel advice encounter 05/30/2022   Inflammation of sacroiliac joint (HCC) 05/29/2022   Encounter for well adult exam with abnormal findings 05/29/2022   Pain in joint of left knee 12/13/2021   Osteoarthritis of left knee 12/10/2021   Left hip pain 09/30/2021   Chronic anticoagulation 08/31/2021   History of ERCP 08/31/2021   History of biliary stent insertion 08/31/2021   Pancreatic cyst 08/31/2021   Trochanteric bursitis of left hip 06/23/2021   GI bleed 03/23/2021   Melena    Acute upper GI bleed 03/22/2021   AF (paroxysmal atrial fibrillation) (HCC) 03/22/2021   Acquired hallux rigidus of left foot 02/14/2021   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   Abnormal findings on esophagogastroduodenoscopy (EGD) 02/03/2021   Polyp of duodenum 02/03/2021   Axillary abscess 06/26/2020   Chronic diarrhea 06/26/2020   Fever 01/18/2020   Pericarditis 07/06/2019   Right hip pain 07/06/2019   Acute chest pain 01/24/2019   Prediabetes 12/24/2018  Rheumatoid arthritis (HCC) 12/24/2018   Preventative health care 06/25/2018   Incarcerated ventral hernia 03/20/2018   DCM (dilated cardiomyopathy) (HCC) 02/11/2017   Chronic systolic heart failure (HCC) 01/15/2017   History of pneumonia 10/23/2016   OA (osteoarthritis) of knee 10/23/2016   Primary osteoarthritis of both feet 10/04/2016   Primary  osteoarthritis of left knee 10/04/2016   DDD (degenerative disc disease), lumbar 10/04/2016   Hemoptysis 05/27/2016   Overweight (BMI 25.0-29.9) 03/14/2016   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   Degeneration of intervertebral disc of cervical spine without prolapsed disc 10/05/2015   Parotid mass 08/23/2015   Dyspnea 11/29/2014   Synovial cyst of lumbar facet joint 09/26/2014   Incisional hernia, without obstruction or gangrene 12/06/2013   Nausea alone 08/27/2013   Colon cancer screening 08/27/2013   Spinal stenosis 04/23/2013   Displacement of lumbar intervertebral disc without myelopathy 12/01/2012   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   Benign prostatic hyperplasia with urinary obstruction 06/18/2010   Other specified disorder of stomach and duodenum 12/16/2008   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 11/15/2008   Gastroesophageal reflux disease 09/02/2008   COLONIC POLYPS, HYPERPLASTIC 05/24/2007   OSA on CPAP 05/24/2007   ALLERGIC RHINITIS 05/24/2007   ESOPHAGEAL STRICTURE 05/24/2007   HIATAL HERNIA 05/24/2007   Hyperlipidemia 02/06/2007   Essential hypertension 02/06/2007   Coronary artery disease involving native coronary artery of native heart without angina pectoris 02/06/2007   Primary osteoarthritis of both hands 02/06/2007   Neck pain on right side 02/06/2007    Past Medical History:  Diagnosis Date   AICD (automatic cardioverter/defibrillator) present    Allergic rhinitis    Allergy    Anginal pain (HCC) 05/26/14   chest pain after chasing dog   Atypical nevus of back 04/27/2003   moderate - mid lower back   Basal cell carcinoma 01/26/2008   right cheek - MOHs   CAD (coronary artery disease)    hx of stent- 2005 RCA   Cataract    beginning stage both eyes   CHF (congestive heart failure) (HCC)    pacemaker [employer]     Chronic back pain    intermittent   Constipation    Dizziness    Dysrhythmia    right bundle branch block    Esophageal stricture    GERD  (gastroesophageal reflux disease)    Hemoptysis    Hiatal hernia    History of colonic polyps    hyperplastic   HTN (hypertension)    Hyperlipidemia    Hypertrophy of prostate with urinary obstruction and other lower urinary tract symptoms (LUTS)    OA (osteoarthritis)    OSA (obstructive sleep apnea)    cpap- 10    Other specified disorder of stomach and duodenum    duodenal periampulary tubulovillous adenoma removed by Dr. Christella Hartigan 5/10   Pericarditis 07/06/2019   Pneumonia    Pre-diabetes    no medications   Shortness of breath    with exertion   Sleep apnea    cpap   Testicular hypofunction     Family History  Problem Relation Age of Onset   Heart disease Mother    Coronary artery disease Father    Diabetes Father    Sudden death Father        due to heart disease   Heart disease Father    COPD Brother    Arthritis Brother    Heart failure Brother    Cancer Brother  lung    Stomach cancer Paternal Grandmother    Arthritis Daughter    Hypertension Other    Colon cancer Neg Hx    Esophageal cancer Neg Hx    Colon polyps Neg Hx    Ulcerative colitis Neg Hx    Liver disease Neg Hx    Pancreatic cancer Neg Hx    Inflammatory bowel disease Neg Hx    Rectal cancer Neg Hx    Past Surgical History:  Procedure Laterality Date   BILIARY STENT PLACEMENT N/A 03/19/2021   Procedure: BILIARY STENT PLACEMENT;  Surgeon: Lemar Lofty., MD;  Location: [facility] ENDOSCOPY;  Service: Gastroenterology;  Laterality: N/A;   BIV ICD INSERTION CRT-D N/A 03/27/2017   Procedure: BIV ICD INSERTION CRT-D;  Surgeon: Regan Lemming, MD;  Location: [facility] INVASIVE CV LAB;  Service: Cardiovascular;  Laterality: N/A;   CARDIAC CATHETERIZATION     '05, last 2009, showing patent RCA stent   COLONOSCOPY  12/2007   HYPERPLASTIC POLYP   COLONOSCOPY  12/2019   COLONOSCOPY WITH PROPOFOL N/A 06/02/2014   Procedure: COLONOSCOPY WITH PROPOFOL;  Surgeon: Rachael Fee, MD;  Location: [facility]  ENDOSCOPY;  Service: Endoscopy;  Laterality: N/A;   CORONARY ANGIOPLASTY  08/2003   ENDOSCOPIC MUCOSAL RESECTION  03/19/2021   Procedure: ENDOSCOPIC MUCOSAL RESECTION, ampullectomy;  Surgeon: Lemar Lofty., MD;  Location: [facility];  Service: Gastroenterology;;   ENDOSCOPIC RETROGRADE CHOLANGIOPANCREATOGRAPHY (ERCP) WITH PROPOFOL N/A 03/19/2021   Procedure: ENDOSCOPIC RETROGRADE CHOLANGIOPANCREATOGRAPHY (ERCP) WITH PROPOFOL;  Surgeon: Lemar Lofty., MD;  Location: [facility];  Service: Gastroenterology;  Laterality: N/A;   ENDOSCOPIC RETROGRADE CHOLANGIOPANCREATOGRAPHY (ERCP) WITH PROPOFOL N/A 11/29/2021   Procedure: ENDOSCOPIC RETROGRADE CHOLANGIOPANCREATOGRAPHY (ERCP) WITH PROPOFOL;  Surgeon: Meridee Score Netty Starring., MD;  Location: [facility] ENDOSCOPY;  Service: Gastroenterology;  Laterality: N/A;   ESOPHAGOGASTRODUODENOSCOPY (EGD) WITH PROPOFOL N/A 06/02/2014   Procedure: ESOPHAGOGASTRODUODENOSCOPY (EGD) WITH PROPOFOL;  Surgeon: Rachael Fee, MD;  Location: [facility];  Service: Endoscopy;  Laterality: N/A;   ESOPHAGOGASTRODUODENOSCOPY (EGD) WITH PROPOFOL N/A 03/19/2021   Procedure: ESOPHAGOGASTRODUODENOSCOPY (EGD) WITH PROPOFOL;  Surgeon: Meridee Score Netty Starring., MD;  Location: [facility];  Service: Gastroenterology;  Laterality: N/A;   ESOPHAGOGASTRODUODENOSCOPY (EGD) WITH PROPOFOL N/A 03/23/2021   Procedure: ESOPHAGOGASTRODUODENOSCOPY (EGD) WITH PROPOFOL;  Surgeon: Meryl Dare, MD;  Location: [facility] ENDOS[facility];  Service: Endoscopy;  Laterality: N/A;   ESOPHAGOGASTRODUODENOSCOPY (EGD) WITH PROPOFOL N/A 03/13/2023   Procedure: ESOPHAGOGASTRODUODENOSCOPY (EGD) WITH PROPOFOL;  Surgeon: Meridee Score Netty Starring., MD;  Location: [facility] ENDOS[facility];  Service: Gastroenterology;  Laterality: N/A;  WITH SIDE SCOPE   hernia surgery x 3     Bilateral Inguinal, Umbicial   INSERTION OF MESH N/A 03/20/2018   Procedure: INSERTION OF MESH;  Surgeon: Ovidio Kin, MD;  Location: [facility] OR;  Service:  General;  Laterality: N/A;   IRRIGATION AND DEBRIDEMENT ABSCESS Left 08/14/2012   Procedure: IRRIGATION AND DEBRIDEMENT LEFT INGUINAL BOIL ;  Surgeon: Kathi Ludwig, MD;  Location: [facility] ORS;  Service: Urology;  Laterality: Left;   JOINT REPLACEMENT Right 2017   KNEE ARTHROPLASTY     LEFT HEART CATH AND CORONARY ANGIOGRAPHY N/A 01/24/2019   Procedure: LEFT HEART CATH AND CORONARY ANGIOGRAPHY;  Surgeon: Swaziland, Peter M, MD;  Location: [facility] INVASIVE CV LAB;  Service: Cardiovascular;  Laterality: N/A;   LEFT HEART CATHETERIZATION WITH CORONARY ANGIOGRAM N/A 05/26/2014   Procedure: LEFT HEART CATHETERIZATION WITH CORONARY ANGIOGRAM;  Surgeon: Micheline Chapman, MD;  Location: [facility] CATH LAB;  Service: Cardiovascular;  Laterality: N/A;  LUMBAR LAMINECTOMY/DECOMPRESSION MICRODISCECTOMY Left 09/26/2014   Procedure: Lumbar Laminectomy for resection of synovial cyst  Lumbar five- sacral one left;  Surgeon: Donalee Citrin, MD;  Location: [facility] NEURO ORS;  Service: Neurosurgery;  Laterality: Left;   MOLE REMOVAL Left 03/20/2018   Procedure: MOLE REMOVAL;  Surgeon: Ovidio Kin, MD;  Location: [facility] OR;  Service: General;  Laterality: Left;   Oral surg to removed growth from ?sinus  10/2010   ?Dermoid removed by DrRiggs   PACEMAKER INSERTION  04/03/2017   PANCREATIC STENT PLACEMENT  03/19/2021   Procedure: PANCREATIC STENT PLACEMENT;  Surgeon: Lemar Lofty., MD;  Location: [facility];  Service: Gastroenterology;;   POLYPECTOMY     RCA stenting     '05 RCA   REMOVAL OF STONES  11/29/2021   Procedure: REMOVAL OF SLUDGE;  Surgeon: Meridee Score Netty Starring., MD;  Location: [facility] ENDOSCOPY;  Service: Gastroenterology;;   right toe surgery  Right    Cyst    RIGHT/LEFT HEART CATH AND CORONARY ANGIOGRAPHY N/A 01/15/2017   Procedure: RIGHT/LEFT HEART CATH AND CORONARY ANGIOGRAPHY;  Surgeon: Tonny Bollman, MD;  Location: [facility] INVASIVE CV LAB;  Service: Cardiovascular;  Laterality: N/A;   s/p right knee arthroscopy  2005    SPHINCTEROTOMY  03/19/2021   Procedure: SPHINCTEROTOMY;  Surgeon: Mansouraty, Netty Starring., MD;  Location: [facility] ENDOSCOPY;  Service: Gastroenterology;;   Francine Graven REMOVAL  11/29/2021   Procedure: STENT REMOVAL;  Surgeon: Lemar Lofty., MD;  Location: [facility] ENDOSCOPY;  Service: Gastroenterology;;   SUBMUCOSAL LIFTING INJECTION  03/19/2021   Procedure: SUBMUCOSAL LIFTING INJECTION;  Surgeon: Lemar Lofty., MD;  Location: [facility] ENDOSCOPY;  Service: Gastroenterology;;   thumb surgery Right    TOTAL KNEE ARTHROPLASTY Right 03/12/2016   Procedure: RIGHT TOTAL KNEE ARTHROPLASTY;  Surgeon: Durene Romans, MD;  Location: [facility] ORS;  Service: Orthopedics;  Laterality: Right;   TOTAL KNEE ARTHROPLASTY Left 12/10/2021   Procedure: TOTAL KNEE ARTHROPLASTY;  Surgeon: Ollen Gross, MD;  Location: [facility] ORS;  Service: Orthopedics;  Laterality: Left;   UPPER ESOPHAGEAL ENDOSCOPIC ULTRASOUND (EUS) N/A 03/19/2021   Procedure: UPPER ESOPHAGEAL ENDOSCOPIC ULTRASOUND (EUS);  Surgeon: Lemar Lofty., MD;  Location: [facility]OS[facility];  Service: Gastroenterology;  Laterality: N/A;   UPPER GASTROINTESTINAL ENDOSCOPY     VENTRAL HERNIA REPAIR N/A 03/20/2018   Procedure: LAPAROSCOPIC VENTRAL INCISIONAL  HERNIA ERAS PATHWAY;  Surgeon: Ovidio Kin, MD;  Location: [facility] OR;  Service: General;  Laterality: N/A;   Social History   Social History Narrative   Not on file   Immunization History  Administered Date(s) Administered   Fluad Quad(high Dose 65+) 02/18/2019, 02/21/2022   Influenza Split 03/21/2011, 03/03/2023   Influenza Whole 04/06/2009, 04/09/2010   Influenza, High Dose Seasonal PF 04/20/2015, 04/15/2016, 02/21/2017   Influenza,inj,Quad PF,6+ Mos 03/24/2014   Influenza-Unspecified 02/26/2018, 02/18/2019, 02/26/2020   PFIZER(Purple Top)SARS-COV-2 Vaccination 05/27/2019, 06/17/2019, 02/15/2020   Pfizer Covid-19 Vaccine Bivalent Booster 34yrs & up 02/21/2022   Pneumococcal Conjugate-13 11/29/2014    Pneumococcal Polysaccharide-23 06/25/2018   Respiratory Syncytial Virus Vaccine,Recomb Aduvanted(Arexvy) 02/24/2023   Rsv, Mab, Judi Cong, 0.5 Ml, Neonate To 24 Mos(Beyfortus) 02/21/2022   Tdap 11/29/2014   Unspecified SARS-COV-2 Vaccination 02/03/2022   Zoster Recombinant(Shingrix) 06/07/2018, 09/26/2018   Zoster, Live 05/20/2006     Objective: Vital Signs: BP (!) 153/86 (BP Location: Left Arm, Patient Position: Sitting, Cuff Size: Normal)   Pulse 73   Resp 15   Ht 5\' 7"  (1.702 m)   Wt 187 lb 6.4 oz (85 kg)   BMI 29.35  kg/m    Physical Exam   Musculoskeletal Exam: He had limited lateral rotation of the cervical spine with some stiffness.  He had discomfort range of motion of his lumbar spine.  Shoulders, elbows, wrist joints with good range of motion with no synovitis.  He had MCP thickening without synovitis.  PIP and DIP thickening was noted.  Subluxation of some of the PIP and DIP joints was noted.  Hip joints and knee joints were in good range of motion.  Bilateral knee joints were replaced.  There was no tenderness over ankles or MTPs.  CDAI Exam: CDAI Score: -- Patient Global: 30 / 100; Provider Global: 30 / 100 Swollen: --; Tender: -- Joint Exam 07/01/2023   No joint exam has been documented for this visit   There is currently no information documented on the homunculus. Go to the Rheumatology activity and complete the homunculus joint exam.  Investigation: No additional findings.  Imaging: DG Chest 2 View Result Date: 06/10/2023 CLINICAL DATA:  Persistent productive cough with congestion, chest pain and shortness of breath for 2-3 weeks. EXAM: CHEST - 2 VIEW COMPARISON:  Radiographs 01/18/2023 and 01/24/2019.  CT 01/05/2018. FINDINGS: Left subclavian AICD leads appear unchanged. The heart size and mediastinal contours are stable. Patchy left basilar opacity is similar to prior studies, likely reflecting scarring. The lungs are otherwise clear. No pleural effusion  or pneumothorax. The bones appear unremarkable. IMPRESSION: No evidence of acute cardiopulmonary process. Stable left basilar scarring. Electronically Signed   By: Carey Bullocks M.D.   On: 06/10/2023 14:26    Recent Labs: Lab Results  Component Value Date   WBC 11.3 (H) 06/10/2023   HGB 15.0 06/10/2023   PLT 313.0 06/10/2023   NA 139 06/10/2023   K 3.8 06/10/2023   CL 101 06/10/2023   CO2 30 06/10/2023   GLUCOSE 95 06/10/2023   BUN 23 06/10/2023   CREATININE 0.89 06/10/2023   BILITOT 0.9 06/10/2023   ALKPHOS 63 06/10/2023   AST 18 06/10/2023   ALT 20 06/10/2023   PROT 6.9 06/10/2023   ALBUMIN 4.5 06/10/2023   CALCIUM 9.9 06/10/2023   GFRAA 100 11/09/2020    Speciality Comments: PLQ eye exam: 10/08/2022 WNL [facility] Follow up 1 year.  Procedures:  No procedures performed Allergies: Sulfonamide derivatives   Assessment / Plan:     Visit Diagnoses: Seropositive rheumatoid arthritis (HCC) RF-, anti-CCP+: Patient denies having a flare of rheumatoid arthritis.  No synovitis was noted on the examination.  He continues to take hydroxychloroquine 200 mg p.o. twice daily Monday to Friday without any interruption.  He had recent upper respiratory tract infection and has a lingering cough.  He continues to have generalized pain and discomfort from underlying osteoarthritis and rheumatoid arthritis.  High risk medication use - Plaquenil 200 mg p.o. daily Monday to Friday.  PLQ eye exam: 10/08/2022.  Labs obtained in January 2025 were reviewed which are within normal limits.  He was advised to get labs every 5 months.  Information on immunization was placed in the AVS.  Medication monitoring encounter - tramadol 50 mg p.o. p.o. nightly as needed pain.  Patient is unable to sleep without tramadol.  Prescription refill for tramadol was sent.  UDS & narcotic agreement: 01/28/2023 - Plan: DRUG MONITOR, TRAMADOL,QN, URINE, DRUG MONITOR, PANEL 5, W/CONF, URINE.  UDS and narcotic agreement  was renewed today.  Primary osteoarthritis of both hands-he has severe rheumatoid arthritis and osteoarthritis overlap.  He continues to have pain and  stiffness in his hands.  No synovitis was noted.  Joint protection was discussed.  Status post bilateral knee replacements - LTR July 2023.RTR October 2017.  Patient complains of discomfort today.  No synovitis or swelling was noted.  Primary osteoarthritis of both feet - Followed by Dr. Victorino Dike.  According the patient he suggested first MTP surgery.  He has a stiffness in his feet.  Chronic left SI joint pain-he has not attended left SI joint discomfort.  Trochanteric bursitis, left hip-he is on discomfort in the left trochanteric bursa.  Spondylosis of lumbar spine-he continues to have lower back pain.  Osteopenia of multiple sites - May 02, 2021 DEXA scan showed T-score of -1.6 BMD 0.706 in the left femoral neck.  Will repeat DEXA scan after next visit.  History of hypertension-blood pressure was elevated at 153/86.  He was advised to monitor blood pressure closely and follow-up with his PCP.  Other medical problems are listed as follows:  History of coronary artery disease  Pacemaker  History of CHF (congestive heart failure)  History of hyperlipidemia  History of gastroesophageal reflux (GERD)  History of gastric polyp  History of colon polyps  History of sleep apnea  History of BPH  Orders: Orders Placed This Encounter  Procedures   DRUG MONITOR, TRAMADOL,QN, URINE   DRUG MONITOR, PANEL 5, W/CONF, URINE   Meds ordered this encounter  Medications   traMADol (ULTRAM) 50 MG tablet    Sig: Take 1 tablet (50 mg total) by mouth at bedtime as needed.    Dispense:  30 tablet    Refill:  0     Follow-Up Instructions: Return in about 5 months (around 11/28/2023) for Rheumatoid arthritis.   Pollyann Savoy, MD  Note - This record has been created using Animal nutritionist.  Chart creation errors have been sought,  but may not always  have been located. Such creation errors do not reflect on  the standard of medical care.

## 2023-06-20 NOTE — Progress Notes (Signed)
No ICM remote transmission received for 06/16/2023 and next ICM transmission scheduled for 07/14/2023.

## 2023-06-30 ENCOUNTER — Other Ambulatory Visit: Payer: Self-pay | Admitting: Rheumatology

## 2023-06-30 DIAGNOSIS — M1990 Unspecified osteoarthritis, unspecified site: Secondary | ICD-10-CM

## 2023-06-30 NOTE — Telephone Encounter (Signed)
 Last Fill: 03/17/2023  Eye exam: 10/08/2022 WNL    Labs: 06/10/2023 WBC 11.3, Monocytes Absolute 1.2  Next Visit: 07/01/2023  Last Visit: 01/28/2023  DX: Seropositive rheumatoid arthritis   Current Dose per office note 01/28/2023: Plaquenil  200 mg p.o. daily Monday to Friday   Okay to refill Plaquenil ?

## 2023-07-01 ENCOUNTER — Ambulatory Visit: Payer: BLUE CROSS/BLUE SHIELD | Attending: Rheumatology | Admitting: Rheumatology

## 2023-07-01 ENCOUNTER — Encounter: Payer: Self-pay | Admitting: Rheumatology

## 2023-07-01 VITALS — BP 153/86 | HR 73 | Resp 15 | Ht 67.0 in | Wt 187.4 lb

## 2023-07-01 DIAGNOSIS — Z79899 Other long term (current) drug therapy: Secondary | ICD-10-CM | POA: Diagnosis not present

## 2023-07-01 DIAGNOSIS — Z860100 Personal history of colon polyps, unspecified: Secondary | ICD-10-CM

## 2023-07-01 DIAGNOSIS — M533 Sacrococcygeal disorders, not elsewhere classified: Secondary | ICD-10-CM

## 2023-07-01 DIAGNOSIS — Z5181 Encounter for therapeutic drug level monitoring: Secondary | ICD-10-CM | POA: Diagnosis not present

## 2023-07-01 DIAGNOSIS — M19072 Primary osteoarthritis, left ankle and foot: Secondary | ICD-10-CM

## 2023-07-01 DIAGNOSIS — Z8669 Personal history of other diseases of the nervous system and sense organs: Secondary | ICD-10-CM

## 2023-07-01 DIAGNOSIS — M7062 Trochanteric bursitis, left hip: Secondary | ICD-10-CM

## 2023-07-01 DIAGNOSIS — M19042 Primary osteoarthritis, left hand: Secondary | ICD-10-CM

## 2023-07-01 DIAGNOSIS — M47816 Spondylosis without myelopathy or radiculopathy, lumbar region: Secondary | ICD-10-CM

## 2023-07-01 DIAGNOSIS — Z950 Presence of cardiac pacemaker: Secondary | ICD-10-CM

## 2023-07-01 DIAGNOSIS — Z8719 Personal history of other diseases of the digestive system: Secondary | ICD-10-CM

## 2023-07-01 DIAGNOSIS — Z8679 Personal history of other diseases of the circulatory system: Secondary | ICD-10-CM

## 2023-07-01 DIAGNOSIS — M059 Rheumatoid arthritis with rheumatoid factor, unspecified: Secondary | ICD-10-CM | POA: Diagnosis not present

## 2023-07-01 DIAGNOSIS — Z87438 Personal history of other diseases of male genital organs: Secondary | ICD-10-CM

## 2023-07-01 DIAGNOSIS — Z8639 Personal history of other endocrine, nutritional and metabolic disease: Secondary | ICD-10-CM

## 2023-07-01 DIAGNOSIS — Z96653 Presence of artificial knee joint, bilateral: Secondary | ICD-10-CM

## 2023-07-01 DIAGNOSIS — M19071 Primary osteoarthritis, right ankle and foot: Secondary | ICD-10-CM

## 2023-07-01 DIAGNOSIS — M8589 Other specified disorders of bone density and structure, multiple sites: Secondary | ICD-10-CM

## 2023-07-01 DIAGNOSIS — G8929 Other chronic pain: Secondary | ICD-10-CM

## 2023-07-01 DIAGNOSIS — M19041 Primary osteoarthritis, right hand: Secondary | ICD-10-CM | POA: Diagnosis not present

## 2023-07-01 MED ORDER — TRAMADOL HCL 50 MG PO TABS: 50.0000 mg | ORAL_TABLET | Freq: Every evening | ORAL | 0 refills | Status: DC | PRN

## 2023-07-01 NOTE — Patient Instructions (Signed)
Vaccines You are taking a medication(s) that can suppress your immune system.  The following immunizations are recommended: Flu annually Covid-19  Td/Tdap (tetanus, diphtheria, pertussis) every 10 years Pneumonia (Prevnar 15 then Pneumovax 23 at least 1 year apart.  Alternatively, can take Prevnar 20 without needing additional dose) Shingrix: 2 doses from 4 weeks to 6 months apart  Please check with your PCP to make sure you are up to date.

## 2023-07-02 LAB — DM TEMPLATE

## 2023-07-04 LAB — DRUG MONITOR, TRAMADOL,QN, URINE
Desmethyltramadol: 1484 ng/mL — ABNORMAL HIGH (ref <100)
Tramadol: 592 ng/mL — ABNORMAL HIGH (ref <100)

## 2023-07-04 LAB — DRUG MONITOR, PANEL 5, W/CONF, URINE
Amphetamines: NEGATIVE ng/mL (ref <500)
Barbiturates: NEGATIVE ng/mL (ref <300)
Benzodiazepines: NEGATIVE ng/mL (ref <100)
Cocaine Metabolite: NEGATIVE ng/mL (ref <150)
Creatinine: 28.8 mg/dL (ref >=20.0)
Marijuana Metabolite: NEGATIVE ng/mL (ref <20)
Methadone Metabolite: NEGATIVE ng/mL (ref <100)
Opiates: NEGATIVE ng/mL (ref <100)
Oxidant: NEGATIVE ug/mL (ref <200)
Oxycodone: NEGATIVE ng/mL (ref <100)
pH: 6 (ref 4.5–9.0)

## 2023-07-04 LAB — DM TEMPLATE

## 2023-07-04 NOTE — Progress Notes (Signed)
UDS is consistent with tramadol use.

## 2023-07-07 ENCOUNTER — Ambulatory Visit: Payer: Medicare Other | Admitting: Internal Medicine

## 2023-07-14 ENCOUNTER — Ambulatory Visit: Payer: BLUE CROSS/BLUE SHIELD | Attending: Cardiology

## 2023-07-14 ENCOUNTER — Ambulatory Visit (INDEPENDENT_AMBULATORY_CARE_PROVIDER_SITE_OTHER): Payer: BLUE CROSS/BLUE SHIELD | Admitting: Pulmonary Disease

## 2023-07-14 ENCOUNTER — Encounter: Payer: Self-pay | Admitting: Pulmonary Disease

## 2023-07-14 VITALS — BP 126/64 | HR 76 | Temp 98.0°F | Ht 67.0 in | Wt 187.8 lb

## 2023-07-14 DIAGNOSIS — G4733 Obstructive sleep apnea (adult) (pediatric): Secondary | ICD-10-CM | POA: Diagnosis not present

## 2023-07-14 DIAGNOSIS — Z95810 Presence of automatic (implantable) cardiac defibrillator: Secondary | ICD-10-CM | POA: Diagnosis not present

## 2023-07-14 DIAGNOSIS — I5022 Chronic systolic (congestive) heart failure: Secondary | ICD-10-CM | POA: Diagnosis not present

## 2023-07-14 MED ORDER — AMOXICILLIN-POT CLAVULANATE 875-125 MG PO TABS
1.0000 | ORAL_TABLET | Freq: Two times a day (BID) | ORAL | 0 refills | Status: DC
Start: 2023-07-14 — End: 2023-12-03

## 2023-07-14 MED ORDER — BUDESONIDE-FORMOTEROL FUMARATE 160-4.5 MCG/ACT IN AERO: 2.0000 | INHALATION_SPRAY | Freq: Two times a day (BID) | RESPIRATORY_TRACT | 3 refills | Status: DC

## 2023-07-14 NOTE — Patient Instructions (Addendum)
 Prescription for Augmentin  Prescription for CPAP to go to [facility] -Auto CPAP of 5-15  Prescription for Symbicort 2 puffs twice a day -When you start feeling a little bit better you can go to just as needed

## 2023-07-14 NOTE — Progress Notes (Signed)
 [name]    [id number]    1946-01-29  Primary Care Physician:[name], Len Blalock, MD  Referring Physician: Corwin Levins, MD [address] [place],  [place] [address]  Chief complaint:   Patient seen today for obstructive sleep apnea  HPI:  Has been doing relatively well with regards to his CPAP  Did have COVID in May 2024  In December-January did have another respiratory infection left him with sinus drainage with persist  Has a chronic nonproductive cough Does have nasal drainage. He does hear himself wheezing all the time nowadays  Has been trying to use his CPAP  Needs an updated machine  He feels relatively well otherwise  Continues to use CPAP nightly  Current download from his machine shows that he is on an auto titrating machine at 5-15  Was diagnosed with moderate obstructive sleep apnea previously  Gets enough hours of rest Wakes up feeling like he has had a good nights rest   Outpatient Encounter Medications as of 07/14/2023  Medication Sig   acetaminophen (TYLENOL) 650 MG CR tablet Take 650 mg by mouth in the morning and at bedtime.   albuterol (VENTOLIN HFA) 108 (90 Base) MCG/ACT inhaler Inhale 2 puffs into the lungs every 6 (six) hours as needed for wheezing or shortness of breath.   atorvastatin (LIPITOR) 20 MG tablet TAKE 1 TABLET BY MOUTH DAILY   b complex vitamins tablet Take 1 tablet by mouth daily with lunch.   betamethasone dipropionate 0.05 % cream    carvedilol (COREG) 25 MG tablet TAKE 1 TABLET BY MOUTH TWICE DAILY   cholecalciferol (VITAMIN D3) 25 MCG (1000 UNIT) tablet Take 1,000 Units by mouth daily.   Coenzyme Q10 (CO Q 10 PO) Take 200 mg by mouth daily with lunch.   ELIQUIS 5 MG TABS tablet TAKE 1 TABLET(5 MG) BY MOUTH TWICE DAILY   Flaxseed, Linseed, (FLAXSEED OIL PO) Take 1 capsule by mouth daily. With Omega 3   fluocinonide (LIDEX) 0.05 % external solution as needed.   folic acid (FOLVITE) 400 MCG tablet Take 400 mcg  by mouth daily.   Glucosamine HCl (GLUCOSAMINE PO) Take 1,500 mg by mouth every evening.   hydrocortisone 2.5 % cream Apply 1 application topically 2 (two) times daily as needed (rash on nose).    hydroxychloroquine (PLAQUENIL) 200 MG tablet TAKE 1 TABLET BY MOUTH EVERY DAY ON MONDAY TO FRIDAY ONLY. DO NOT TAKE ON SATURDAY OR SUNDAY.   Lutein 20 MG TABS Take 20 mg by mouth daily.   Multiple Vitamin (MULTIVITAMIN) capsule Take 1 capsule by mouth daily.     nitroGLYCERIN (NITROSTAT) 0.4 MG SL tablet Dissolve 1 tablet under the tongue every 5 minutes as needed for chest pain. Max of 3 doses, then 911.   NON FORMULARY CPAP machine with sleep.   pantoprazole (PROTONIX) 40 MG tablet Take 1 tablet (40 mg total) by mouth daily. before breakfast   polyethylene glycol (MIRALAX / GLYCOLAX) 17 g packet Take 17 g by mouth daily as needed for mild constipation.   Probiotic Product (PROBIOTIC DAILY PO) Take 1 capsule by mouth daily with lunch.    RESTASIS 0.05 % ophthalmic emulsion Place 1 drop into both eyes 2 (two) times daily as needed (dry eyes).   sacubitril-valsartan (ENTRESTO) 49-51 MG Take 1 tablet by mouth 2 (two) times daily.   Saw Palmetto, Serenoa repens, (SAW PALMETTO PO) Take 450 mg by mouth every evening.   spironolactone (  ALDACTONE) 25 MG tablet Take 1 tablet (25 mg total) by mouth daily.   tamsulosin (FLOMAX) 0.4 MG CAPS capsule Take 0.4 mg by mouth daily.   Testosterone 20 % CREA Apply 2 mLs topically daily. Rub on shoulder   tobramycin-dexamethasone (TOBRADEX) ophthalmic ointment as needed.   traMADol (ULTRAM) 50 MG tablet Take 1 tablet (50 mg total) by mouth at bedtime as needed.   TURMERIC PO Take 1 tablet by mouth daily with lunch.    vitamin C (ASCORBIC ACID) 500 MG tablet Take 500 mg by mouth daily.   zinc gluconate 50 MG tablet Take 50 mg by mouth daily.   [DISCONTINUED] amoxicillin-clavulanate (AUGMENTIN) 875-125 MG tablet Take 1 tablet by mouth 2 (two) times daily. (Patient not  taking: Reported on 07/14/2023)   [DISCONTINUED] benzonatate (TESSALON) 100 MG capsule Take 1-2 capsules (100-200 mg total) by mouth 3 (three) times daily as needed. (Patient not taking: Reported on 07/14/2023)   [DISCONTINUED] predniSONE (DELTASONE) 20 MG tablet Take 2 tablets (40 mg total) by mouth daily with breakfast. (Patient not taking: Reported on 07/14/2023)   No facility-administered encounter medications on file as of 07/14/2023.    Allergies as of 07/14/2023 - Review Complete 07/14/2023  Allergen Reaction Noted   Sulfonamide derivatives Rash 06/26/2010    Past Medical History:  Diagnosis Date   AICD (automatic cardioverter/defibrillator) present    Allergic rhinitis    Allergy    Anginal pain (HCC) 05/26/14   chest pain after chasing dog   Atypical nevus of back 04/27/2003   moderate - mid lower back   Basal cell carcinoma 01/26/2008   right cheek - MOHs   CAD (coronary artery disease)    hx of stent- 2005 RCA   Cataract    beginning stage both eyes   CHF (congestive heart failure) (HCC)    pacemaker Medtronic     Chronic back pain    intermittent   Constipation    Dizziness    Dysrhythmia    right bundle branch block    Esophageal stricture    GERD (gastroesophageal reflux disease)    Hemoptysis    Hiatal hernia    History of colonic polyps    hyperplastic   HTN (hypertension)    Hyperlipidemia    Hypertrophy of prostate with urinary obstruction and other lower urinary tract symptoms (LUTS)    OA (osteoarthritis)    OSA (obstructive sleep apnea)    cpap- 10    Other specified disorder of stomach and duodenum    duodenal periampulary tubulovillous adenoma removed by Dr. Christella Hartigan 5/10   Pericarditis 07/06/2019   Pneumonia    Pre-diabetes    no medications   Shortness of breath    with exertion   Sleep apnea    cpap   Testicular hypofunction     Past Surgical History:  Procedure Laterality Date   BILIARY STENT PLACEMENT N/A 03/19/2021   Procedure:  BILIARY STENT PLACEMENT;  Surgeon: Lemar Lofty., MD;  Location: [facility];  Service: Gastroenterology;  Laterality: N/A;   BIV ICD INSERTION CRT-D N/A 03/27/2017   Procedure: BIV ICD INSERTION CRT-D;  Surgeon: Regan Lemming, MD;  Location: [facility] INVASIVE CV LAB;  Service: Cardiovascular;  Laterality: N/A;   CARDIAC CATHETERIZATION     '05, last 2009, showing patent RCA stent   COLONOSCOPY  12/2007   HYPERPLASTIC POLYP   COLONOSCOPY  12/2019   COLONOSCOPY WITH PROPOFOL N/A 06/02/2014   Procedure: COLONOSCOPY WITH PROPOFOL;  Surgeon: Melton Alar  Christella Hartigan, MD;  Location: [facility];  Service: Endoscopy;  Laterality: N/A;   CORONARY ANGIOPLASTY  08/2003   ENDOSCOPIC MUCOSAL RESECTION  03/19/2021   Procedure: ENDOSCOPIC MUCOSAL RESECTION, ampullectomy;  Surgeon: Lemar Lofty., MD;  Location: [facility] ENDOSCOPY;  Service: Gastroenterology;;   ENDOSCOPIC RETROGRADE CHOLANGIOPANCREATOGRAPHY (ERCP) WITH PROPOFOL N/A 03/19/2021   Procedure: ENDOSCOPIC RETROGRADE CHOLANGIOPANCREATOGRAPHY (ERCP) WITH PROPOFOL;  Surgeon: Lemar Lofty., MD;  Location: [facility];  Service: Gastroenterology;  Laterality: N/A;   ENDOSCOPIC RETROGRADE CHOLANGIOPANCREATOGRAPHY (ERCP) WITH PROPOFOL N/A 11/29/2021   Procedure: ENDOSCOPIC RETROGRADE CHOLANGIOPANCREATOGRAPHY (ERCP) WITH PROPOFOL;  Surgeon: Meridee Score Netty Starring., MD;  Location: [facility]OSCOPY;  Service: Gastroenterology;  Laterality: N/A;   ESOPHAGOGASTRODUODENOSCOPY (EGD) WITH PROPOFOL N/A 06/02/2014   Procedure: ESOPHAGOGASTRODUODENOSCOPY (EGD) WITH PROPOFOL;  Surgeon: Rachael Fee, MD;  Location: [facility];  Service: Endoscopy;  Laterality: N/A;   ESOPHAGOGASTRODUODENOSCOPY (EGD) WITH PROPOFOL N/A 03/19/2021   Procedure: ESOPHAGOGASTRODUODENOSCOPY (EGD) WITH PROPOFOL;  Surgeon: Meridee Score Netty Starring., MD;  Location: [facility] ENDOS[facility];  Service: Gastroenterology;  Laterality: N/A;   ESOPHAGOGASTRODUODENOSCOPY (EGD) WITH PROPOFOL N/A  03/23/2021   Procedure: ESOPHAGOGASTRODUODENOSCOPY (EGD) WITH PROPOFOL;  Surgeon: Meryl Dare, MD;  Location: [facility];  Service: Endoscopy;  Laterality: N/A;   ESOPHAGOGASTRODUODENOSCOPY (EGD) WITH PROPOFOL N/A 03/13/2023   Procedure: ESOPHAGOGASTRODUODENOSCOPY (EGD) WITH PROPOFOL;  Surgeon: Meridee Score Netty Starring., MD;  Location: [facility];  Service: Gastroenterology;  Laterality: N/A;  WITH SIDE SCOPE   hernia surgery x 3     Bilateral Inguinal, Umbicial   INSERTION OF MESH N/A 03/20/2018   Procedure: INSERTION OF MESH;  Surgeon: Ovidio Kin, MD;  Location: [facility] OR;  Service: General;  Laterality: N/A;   IRRIGATION AND DEBRIDEMENT ABSCESS Left 08/14/2012   Procedure: IRRIGATION AND DEBRIDEMENT LEFT INGUINAL BOIL ;  Surgeon: Kathi Ludwig, MD;  Location: [facility] ORS;  Service: Urology;  Laterality: Left;   JOINT REPLACEMENT Right 2017   KNEE ARTHROPLASTY     LEFT HEART CATH AND CORONARY ANGIOGRAPHY N/A 01/24/2019   Procedure: LEFT HEART CATH AND CORONARY ANGIOGRAPHY;  Surgeon: Swaziland, Peter M, MD;  Location: [facility] INVASIVE CV LAB;  Service: Cardiovascular;  Laterality: N/A;   LEFT HEART CATHETERIZATION WITH CORONARY ANGIOGRAM N/A 05/26/2014   Procedure: LEFT HEART CATHETERIZATION WITH CORONARY ANGIOGRAM;  Surgeon: Micheline Chapman, MD;  Location: [facility] CATH LAB;  Service: Cardiovascular;  Laterality: N/A;   LUMBAR LAMINECTOMY/DECOMPRESSION MICRODISCECTOMY Left 09/26/2014   Procedure: Lumbar Laminectomy for resection of synovial cyst  Lumbar five- sacral one left;  Surgeon: Donalee Citrin, MD;  Location: [facility] NEURO ORS;  Service: Neurosurgery;  Laterality: Left;   MOLE REMOVAL Left 03/20/2018   Procedure: MOLE REMOVAL;  Surgeon: Ovidio Kin, MD;  Location: [facility] OR;  Service: General;  Laterality: Left;   Oral surg to removed growth from ?sinus  10/2010   ?Dermoid removed by DrRiggs   PACEMAKER INSERTION  04/03/2017   PANCREATIC STENT PLACEMENT  03/19/2021   Procedure: PANCREATIC STENT PLACEMENT;   Surgeon: Lemar Lofty., MD;  Location: [facility] ENDOSCOPY;  Service: Gastroenterology;;   POLYPECTOMY     RCA stenting     '05 RCA   REMOVAL OF STONES  11/29/2021   Procedure: REMOVAL OF SLUDGE;  Surgeon: Meridee Score Netty Starring., MD;  Location: [facility] ENDOSCOPY;  Service: Gastroenterology;;   right toe surgery  Right    Cyst    RIGHT/LEFT HEART CATH AND CORONARY ANGIOGRAPHY N/A 01/15/2017   Procedure: RIGHT/LEFT HEART CATH AND CORONARY ANGIOGRAPHY;  Surgeon: Tonny Bollman, MD;  Location: [facility] INVASIVE  CV LAB;  Service: Cardiovascular;  Laterality: N/A;   s/p right knee arthroscopy  2005   SPHINCTEROTOMY  03/19/2021   Procedure: SPHINCTEROTOMY;  Surgeon: Mansouraty, Netty Starring., MD;  Location: [facility];  Service: Gastroenterology;;   Francine Graven REMOVAL  11/29/2021   Procedure: STENT REMOVAL;  Surgeon: Lemar Lofty., MD;  Location: [facility] ENDOSCOPY;  Service: Gastroenterology;;   SUBMUCOSAL LIFTING INJECTION  03/19/2021   Procedure: SUBMUCOSAL LIFTING INJECTION;  Surgeon: Lemar Lofty., MD;  Location: [facility];  Service: Gastroenterology;;   thumb surgery Right    TOTAL KNEE ARTHROPLASTY Right 03/12/2016   Procedure: RIGHT TOTAL KNEE ARTHROPLASTY;  Surgeon: Durene Romans, MD;  Location: [facility] ORS;  Service: Orthopedics;  Laterality: Right;   TOTAL KNEE ARTHROPLASTY Left 12/10/2021   Procedure: TOTAL KNEE ARTHROPLASTY;  Surgeon: Ollen Gross, MD;  Location: [facility] ORS;  Service: Orthopedics;  Laterality: Left;   UPPER ESOPHAGEAL ENDOSCOPIC ULTRASOUND (EUS) N/A 03/19/2021   Procedure: UPPER ESOPHAGEAL ENDOSCOPIC ULTRASOUND (EUS);  Surgeon: Lemar Lofty., MD;  Location: [facility];  Service: Gastroenterology;  Laterality: N/A;   UPPER GASTROINTESTINAL ENDOSCOPY     VENTRAL HERNIA REPAIR N/A 03/20/2018   Procedure: LAPAROSCOPIC VENTRAL INCISIONAL  HERNIA ERAS PATHWAY;  Surgeon: Ovidio Kin, MD;  Location: [facility] OR;  Service: General;  Laterality: N/A;    Family History   Problem Relation Age of Onset   Heart disease Mother    Coronary artery disease Father    Diabetes Father    Sudden death Father        due to heart disease   Heart disease Father    COPD Brother    Arthritis Brother    Heart failure Brother    Cancer Brother        lung    Stomach cancer Paternal Grandmother    Arthritis Daughter    Hypertension Other    Colon cancer Neg Hx    Esophageal cancer Neg Hx    Colon polyps Neg Hx    Ulcerative colitis Neg Hx    Liver disease Neg Hx    Pancreatic cancer Neg Hx    Inflammatory bowel disease Neg Hx    Rectal cancer Neg Hx     Social History   Socioeconomic History   Marital status: Married    Spouse name: Not on file   Number of children: 1   Years of education: Not on file   Highest education level: Bachelor's degree (e.g., BA, AB, BS)  Occupational History   Occupation: Advertising account planner: [employer]  Tobacco Use   Smoking status: Former    Current packs/day: 0.00    Types: Cigarettes    Quit date: 05/20/1990    Years since quitting: 33.1    Passive exposure: Never   Smokeless tobacco: Never   Tobacco comments:    previous 30 pack year history  Vaping Use   Vaping status: Never Used  Substance and Sexual Activity   Alcohol use: Yes    Comment: rare   Drug use: Never   Sexual activity: Not on file  Other Topics Concern   Not on file  Social History Narrative   Not on file   Social Drivers of Health   Financial Resource Strain: Low Risk  (06/06/2023)   Overall Financial Resource Strain (CARDIA)    Difficulty of Paying Living Expenses: Not hard at all  Food Insecurity: No Food Insecurity (06/06/2023)   Hunger Vital Sign    Worried About  Running Out of Food in the Last Year: Never true    Ran Out of Food in the Last Year: Never true  Transportation Needs: No Transportation Needs (06/06/2023)   PRAPARE - Administrator, Civil Service (Medical): No    Lack of Transportation  (Non-Medical): No  Physical Activity: Sufficiently Active (06/06/2023)   Exercise Vital Sign    Days of Exercise per Week: 5 days    Minutes of Exercise per Session: 30 min  Stress: No Stress Concern Present (06/06/2023)   [employer] of Occupational Health - Occupational Stress Questionnaire    Feeling of Stress : Only a little  Social Connections: Socially Integrated (06/06/2023)   Social Connection and Isolation Panel [NHANES]    Frequency of Communication with Friends and Family: More than three times a week    Frequency of Social Gatherings with Friends and Family: Three times a week    Attends Religious Services: More than 4 times per year    Active Member of Clubs or Organizations: Yes    Attends Banker Meetings: More than 4 times per year    Marital Status: Married  Catering manager Violence: Not At Risk (04/02/2023)   Humiliation, Afraid, Rape, and Kick questionnaire    Fear of Current or Ex-Partner: No    Emotionally Abused: No    Physically Abused: No    Sexually Abused: No    Review of Systems  Respiratory:  Positive for apnea, cough and shortness of breath.   Psychiatric/Behavioral:  Positive for sleep disturbance.     There were no vitals filed for this visit.    Physical Exam Constitutional:      Appearance: Normal appearance.  HENT:     Head: Normocephalic.     Mouth/Throat:     Mouth: Mucous membranes are moist.  Eyes:     General: No scleral icterus.    Pupils: Pupils are equal, round, and reactive to light.  Cardiovascular:     Rate and Rhythm: Normal rate and regular rhythm.     Heart sounds: No murmur heard.    No friction rub.  Pulmonary:     Effort: No respiratory distress.     Breath sounds: No stridor. No wheezing or rhonchi.  Musculoskeletal:     Cervical back: No rigidity or tenderness.  Neurological:     Mental Status: He is alert.  Psychiatric:        Mood and Affect: Mood normal.      07/01/2022    3:00 PM   Results of the Epworth flowsheet  Sitting and reading 2  Watching TV 1  Sitting, inactive in a public place (e.g. a theatre or a meeting) 0  As a passenger in a car for an hour without a break 3  Lying down to rest in the afternoon when circumstances permit 3  Sitting and talking to someone 0  Sitting quietly after a lunch without alcohol 2  In a car, while stopped for a few minutes in traffic 2  Total score 13   Data Reviewed: Download from his machine shows good compliance Excellent compliance Average use of 8 hours AutoSet 5-15 Residual AHI of 0.3  Assessment:  Moderate obstructive sleep apnea, adequately treated with CPAP therapy  Continues to benefit from CPAP therapy  Sinusitis -Call in a course of Augmentin  Persistent cough  Plan/Recommendations: Prescription for Augmentin sent in  Prescription for CPAP to go to [facility] auto CPAP 5-15  For his persistent  cough and congestion and wheezing -Prescription for Symbicort 160, 2 puffs twice a day  Encouraged to call with significant concerns  Tentative follow-up in a year from now  I spent 30 minutes dedicated to the care of this patient on the date of this encounter to include previsit review of records, face-to-face time with the patient discussing conditions above, post visit ordering of testing,ordering medications,independentlyinterpreting results, clinical documentation with electronic health record and communicated necessary findings to members of the patient's care team  Virl Diamond MD [facility] 07/14/2023, 11:18 AM  CC: Corwin Levins, MD

## 2023-07-15 ENCOUNTER — Telehealth: Payer: Self-pay | Admitting: Internal Medicine

## 2023-07-15 ENCOUNTER — Other Ambulatory Visit: Payer: Self-pay

## 2023-07-15 MED ORDER — PANTOPRAZOLE SODIUM 40 MG PO TBEC: 40.0000 mg | DELAYED_RELEASE_TABLET | Freq: Every day | ORAL | 3 refills | Status: AC

## 2023-07-15 NOTE — Telephone Encounter (Signed)
 Refill has been sent.

## 2023-07-15 NOTE — Telephone Encounter (Signed)
 Prescription Request  07/15/2023  LOV: 06/10/2023  What is the name of the medication or equipment? Pantoprazole   Have you contacted your pharmacy to request a refill? Yes   Which pharmacy would you like this sent to?  [id number] [place], [place] - [address] AT [facility] [address] [place] [address] Phone: [phone] Fax: [phone]    Patient notified that their request is being sent to the clinical staff for review and that they should receive a response within 2 business days.   Please advise at Mobile [phone] (mobile)

## 2023-07-18 NOTE — Progress Notes (Signed)
 EPIC Encounter for ICM Monitoring  Patient Name: [name] is a 78 y.o. male Date: 07/18/2023 Primary Care Physican: Corwin Levins, MD Primary Cardiologist: Excell Seltzer Electrophysiologist: Elberta Fortis Bi-V Pacing:   95.0%   07/14/2023 Office Weight: 187 lbs   Time in AT/AF  <0.1 hr/day (<0.1%)                                                          Transmission results reviewed.  Results sent via mychart.   Diet:  Does eat restaurant foods.     Optivol thoracic impedance suggesting normal fluid levels within the last month.   Prescribed: Spironolactone 25 mg take 0.5 tablet daily.   Labs: 06/10/2023 Creatinine 0.89, BUN 23, Potassium 3.8, Sodium 139, GFR 81.65 A complete set of results can be found in Results Review.   Recommendations:  No changes and encouraged to call if experiencing any fluid symptoms.    Follow-up plan: ICM clinic phone appointment on 08/18/2023.   91 day device clinic remote transmission 07/31/2023.       EP/Cardiology Office Visits:  06/12/2023 with Dr Excell Seltzer.    Recall 07/19/2023 with Francis Dowse, PA   Copy of ICM check sent to Dr. Elberta Fortis.     3 month ICM trend: 07/14/2023.    12-14 Month ICM trend:     Karie Soda, RN 07/18/2023 8:13 AM

## 2023-07-28 ENCOUNTER — Ambulatory Visit: Payer: Self-pay | Admitting: Pulmonary Disease

## 2023-07-28 ENCOUNTER — Other Ambulatory Visit: Payer: Self-pay | Admitting: Pulmonary Disease

## 2023-07-28 ENCOUNTER — Telehealth: Payer: Self-pay | Admitting: Pulmonary Disease

## 2023-07-28 ENCOUNTER — Encounter: Payer: Self-pay | Admitting: Pulmonary Disease

## 2023-07-28 MED ORDER — PREDNISONE 20 MG PO TABS: 20.0000 mg | ORAL_TABLET | Freq: Every day | ORAL | 0 refills | Status: DC

## 2023-07-28 MED ORDER — BENZONATATE 200 MG PO CAPS
200.0000 mg | ORAL_CAPSULE | Freq: Three times a day (TID) | ORAL | 0 refills | Status: DC | PRN
Start: 2023-07-28 — End: 2024-01-15

## 2023-07-28 MED ORDER — AZITHROMYCIN 250 MG PO TABS: ORAL_TABLET | ORAL | 0 refills | Status: DC

## 2023-07-28 NOTE — Telephone Encounter (Signed)
 Call from [facility], spoke to [place], office reporting pt experiencing cough. Speaking to pt.  TRIAGE SUMMARY NOTE: Pt reporting recent respiratory infection that went away with meds as prescribed but just "came back with a vengeance" on Saturday, "slightly" more SOB than normal only with exertion, and "slight" wheezing, intermittent dry hacking cough with intermittent coughing fits, clear sputum at times, nasal congestion and runny nose. Pt confirms no chest pain, fever, chills. Advised pt be examined in next 4 hours, pt has other doc appt today, unable to do appt today. Connected to front desk at [facility], no availability today or tomorrow, nurse informed pt, advised pt be examined today, offered to schedule today with PCP, unable to do today. Sending message to pulm office for further recommendations - please advise.  E2C2 Pulmonary Triage - Initial Assessment Questions "Chief Complaint (e.g., cough, sob, wheezing, fever, chills, sweat or additional symptoms) *Go to specific symptom protocol after initial questions. Coughing up clear sputum, dry cough mostly, saw Dr. Val Eagle on 2/24, seemed to clear up then came back Saturday with a vengeance, hacking cough, slightly more SOB than normal only with exertion, no fever or chills, slight wheezing, no chest pain, night sweats but not much, wheezing not abnormal for me  "How long have symptoms been present?" Back since Saturday  Have you tested for COVID or Flu? Note: If not, ask patient if a home test can be taken. If so, instruct patient to call back for positive results. Yes negative covid  MEDICINES:   "Have you used any OTC meds to help with symptoms?" Yes If yes, ask "What medications?" Nyquil and dayquil, been trying to treat myself, tussin DM at night  "Have you used your inhalers/maintenance medication?" Yes If yes, "What medications?" Budesonide inhaler 2x daily, rescue inhaler not using never opened the box  OXYGEN: "Do you wear  supplemental oxygen?" No CPAP machine at night  "Do you monitor your oxygen levels?" No just did it and 96%, always above 90%, at doc office a lot  Cannot see him today, have other appts today  Reason for Disposition  Wheezing is present  Answer Assessment - Initial Assessment Questions 2. SEVERITY: "How bad is the cough today?"      This morning seems to be okay, not gone, comes and goes, coughing spell yesterday 3. SPUTUM: "Describe the color of your sputum" (none, dry cough; clear, white, yellow, green)     Clear when comes up, mostly dry hacking cough 4. HEMOPTYSIS: "Are you coughing up any blood?" If so ask: "How much?" (flecks, streaks, tablespoons, etc.)     no 5. DIFFICULTY BREATHING: "Are you having difficulty breathing?" If Yes, ask: "How bad is it?" (e.g., mild, moderate, severe)    - MILD: No SOB at rest, mild SOB with walking, speaks normally in sentences, can lie down, no retractions, pulse < 100.    - MODERATE: SOB at rest, SOB with minimal exertion and prefers to sit, cannot lie down flat, speaks in phrases, mild retractions, audible wheezing, pulse 100-120.    - SEVERE: Very SOB at rest, speaks in single words, struggling to breathe, sitting hunched forward, retractions, pulse > 120      Slight more SOB than normal 6. FEVER: "Do you have a fever?" If Yes, ask: "What is your temperature, how was it measured, and when did it start?"     no 7. CARDIAC HISTORY: "Do you have any history of heart disease?" (e.g., heart attack, congestive heart failure)  Significant hx 8. LUNG HISTORY: "Do you have any history of lung disease?"  (e.g., pulmonary embolus, asthma, emphysema)     OSA 9. PE RISK FACTORS: "Do you have a history of blood clots?" (or: recent major surgery, recent prolonged travel, bedridden)     no 10. OTHER SYMPTOMS: "Do you have any other symptoms?" (e.g., runny nose, wheezing, chest pain)       Runny nose and nasal congestion whole lot of that  Protocols  used: Cough - Acute Productive-A-AH

## 2023-07-28 NOTE — Telephone Encounter (Signed)
Dr. Olalere please advise 

## 2023-07-31 ENCOUNTER — Ambulatory Visit: Payer: Medicare Other

## 2023-07-31 DIAGNOSIS — I428 Other cardiomyopathies: Secondary | ICD-10-CM

## 2023-07-31 LAB — CUP PACEART REMOTE DEVICE CHECK
Battery Remaining Longevity: 4 mo
Battery Voltage: 2.81 V
Brady Statistic AP VP Percent: 2.53 %
Brady Statistic AP VS Percent: 0.03 %
Brady Statistic AS VP Percent: 94.71 %
Brady Statistic AS VS Percent: 2.73 %
Brady Statistic RA Percent Paced: 2.53 %
Brady Statistic RV Percent Paced: 35.33 %
Date Time Interrogation Session: 20250313001703
HighPow Impedance: 67 Ohm
Implantable Lead Connection Status: 753985
Implantable Lead Connection Status: 753985
Implantable Lead Connection Status: 753985
Implantable Lead Implant Date: 20181108
Implantable Lead Implant Date: 20181108
Implantable Lead Implant Date: 20181108
Implantable Lead Location: 753858
Implantable Lead Location: 753859
Implantable Lead Location: 753860
Implantable Lead Model: 4298
Implantable Lead Model: 5076
Implantable Pulse Generator Implant Date: 20181108
Lead Channel Impedance Value: 1083 Ohm
Lead Channel Impedance Value: 1140 Ohm
Lead Channel Impedance Value: 1197 Ohm
Lead Channel Impedance Value: 193.707
Lead Channel Impedance Value: 215.064
Lead Channel Impedance Value: 246.683
Lead Channel Impedance Value: 272.032
Lead Channel Impedance Value: 316.116
Lead Channel Impedance Value: 361 Ohm
Lead Channel Impedance Value: 361 Ohm
Lead Channel Impedance Value: 418 Ohm
Lead Channel Impedance Value: 532 Ohm
Lead Channel Impedance Value: 551 Ohm
Lead Channel Impedance Value: 665 Ohm
Lead Channel Impedance Value: 665 Ohm
Lead Channel Impedance Value: 665 Ohm
Lead Channel Impedance Value: 779 Ohm
Lead Channel Impedance Value: 779 Ohm
Lead Channel Pacing Threshold Amplitude: 0.5 V
Lead Channel Pacing Threshold Amplitude: 0.5 V
Lead Channel Pacing Threshold Amplitude: 2.75 V
Lead Channel Pacing Threshold Pulse Width: 0.4 ms
Lead Channel Pacing Threshold Pulse Width: 0.4 ms
Lead Channel Pacing Threshold Pulse Width: 1 ms
Lead Channel Sensing Intrinsic Amplitude: 11.5 mV
Lead Channel Sensing Intrinsic Amplitude: 11.5 mV
Lead Channel Sensing Intrinsic Amplitude: 2.625 mV
Lead Channel Sensing Intrinsic Amplitude: 2.625 mV
Lead Channel Setting Pacing Amplitude: 2 V
Lead Channel Setting Pacing Amplitude: 2.5 V
Lead Channel Setting Pacing Amplitude: 3 V
Lead Channel Setting Pacing Pulse Width: 0.4 ms
Lead Channel Setting Pacing Pulse Width: 1 ms
Lead Channel Setting Sensing Sensitivity: 0.3 mV
Zone Setting Status: 755011
Zone Setting Status: 755011

## 2023-08-01 ENCOUNTER — Telehealth (INDEPENDENT_AMBULATORY_CARE_PROVIDER_SITE_OTHER): Payer: Self-pay | Admitting: Otolaryngology

## 2023-08-01 NOTE — Telephone Encounter (Signed)
 Left vm to confirm appt and location for 08/04/2023.

## 2023-08-04 ENCOUNTER — Other Ambulatory Visit (INDEPENDENT_AMBULATORY_CARE_PROVIDER_SITE_OTHER): Payer: Self-pay | Admitting: Otolaryngology

## 2023-08-04 ENCOUNTER — Encounter (INDEPENDENT_AMBULATORY_CARE_PROVIDER_SITE_OTHER): Payer: Self-pay

## 2023-08-04 ENCOUNTER — Ambulatory Visit (INDEPENDENT_AMBULATORY_CARE_PROVIDER_SITE_OTHER): Payer: Medicare Other | Admitting: Otolaryngology

## 2023-08-04 VITALS — BP 113/66 | HR 85 | Ht 67.0 in | Wt 185.0 lb

## 2023-08-04 DIAGNOSIS — K219 Gastro-esophageal reflux disease without esophagitis: Secondary | ICD-10-CM | POA: Diagnosis not present

## 2023-08-04 DIAGNOSIS — R053 Chronic cough: Secondary | ICD-10-CM

## 2023-08-04 DIAGNOSIS — R0982 Postnasal drip: Secondary | ICD-10-CM | POA: Diagnosis not present

## 2023-08-04 MED ORDER — AZELASTINE HCL 0.1 % NA SOLN: 2.0000 | Freq: Two times a day (BID) | NASAL | 12 refills | Status: DC | PRN

## 2023-08-04 MED ORDER — OMEPRAZOLE 40 MG PO CPDR: 40.0000 mg | DELAYED_RELEASE_CAPSULE | Freq: Two times a day (BID) | ORAL | 0 refills | Status: DC

## 2023-08-04 MED ORDER — IPRATROPIUM BROMIDE 0.06 % NA SOLN: 2.0000 | Freq: Two times a day (BID) | NASAL | 12 refills | Status: DC | PRN

## 2023-08-04 NOTE — Telephone Encounter (Signed)
 fax confirmation received 08/04/23

## 2023-08-04 NOTE — Progress Notes (Signed)
 Dear Dr. Jonny Ruiz, Here is my assessment for our mutual patient, [name]. Thank you for allowing me the opportunity to care for your patient. Please do not hesitate to contact me should you have any other questions. Sincerely, Dr. Jovita Kussmaul  Otolaryngology Clinic Note Referring provider: Dr. Jonny Ruiz HPI:  [name] is a 78 y.o. male kindly referred by Dr. Jonny Ruiz for evaluation of chronic sinusitis and cough.  Initial visit (07/2023): Patient reports: he had COVID in May and then another URI in December (reports likely COVID but not tested) with likely sinusitis, and since then he has had a chronic non-productive cough, especially since December. Does have a tickle in throat, no triggers. Denies throat clearing. Drinking water makes things better.  Symptoms at the time of infection included discolored drainage, congestion, facial pressure/pain. He had 3 rounds of antibiotics, and cough suppressants, but nothing really helps. Current symptoms include anterior rhinorrhea (both sides, mucoid, not discolored), some PND, and dry cough. Drainage is constant, eating makes things worse and this has been constant but cough is new since URI as before. He did see Pulm (Dr. Wynona Neat), given Augmentin and Symbicort, and does not think they helped, but his cough is better. Tried tessalon pearls and mucinex did not help.  He is currently using saline spray but otherwise no sprays trialed.   Patient otherwise denies: - dysphagia, odynophagia, aspiration episodes or PNA, need for Heimlich, unintentional weight loss - changes in voice, shortness of breath, hemoptysis - ear pain, neck masses  Does use CPAP for OSA No history of frequent sinus infections, no history of Allergies or typical AR symptoms. No prior CT.   Has GERD on Pantoprazole.  H&N Surgery: Maxillary sinus surgery? (Calcium buildup in sinuses?); Rhinoplasty (NVR) Personal or FHx of bleeding dz or anesthesia difficulty: no  GLP-1: no AP/AC:  Eliquis  Tobacco: former, quit  PMHx: CAD s/p stents, CHF, AF, Pacemaker, OSA on CPAP, RA on Plaquenil, GERD  Independent Review of Additional Tests or Records:  Dr. Wynona Neat (Pulm) 07/14/2023: doing well with CPAP; Dec resp infxn with sinus drainage; chronic coug hand nasal drainage; ; Dx: Sinusitis, Cough: Rx: Augmentin, symbicort; 07/28/2023 - persistent sx, Rx azithromycin, steroid, Ref ENT 06/04/2023 Marcelline Mates PA - FM: Dx: Bronchitiis; Rx: Prednisone, albuterol Labs:  CBC 06/10/2023: WBC 11.3, Hgb 15, Plt 414; Eos 100; 06/10/2023 BMP: BUN 23/0.89  PMH/Meds/All/SocHx/FamHx/ROS:   Past Medical History:  Diagnosis Date   AICD (automatic cardioverter/defibrillator) present    Allergic rhinitis    Allergy    Anginal pain (HCC) 05/26/14   chest pain after chasing dog   Atypical nevus of back 04/27/2003   moderate - mid lower back   Basal cell carcinoma 01/26/2008   right cheek - MOHs   CAD (coronary artery disease)    hx of stent- 2005 RCA   Cataract    beginning stage both eyes   CHF (congestive heart failure) (HCC)    pacemaker [employer]onic     Chronic back pain    intermittent   Constipation    Dizziness    Dysrhythmia    right bundle branch block    Esophageal stricture    GERD (gastroesophageal reflux disease)    Hemoptysis    Hiatal hernia    History of colonic polyps    hyperplastic   HTN (hypertension)    Hyperlipidemia    Hypertrophy of prostate with urinary obstruction and other lower urinary tract symptoms (LUTS)    OA (osteoarthritis)  OSA (obstructive sleep apnea)    cpap- 10    Other specified disorder of stomach and duodenum    duodenal periampulary tubulovillous adenoma removed by Dr. Christella Hartigan 5/10   Pericarditis 07/06/2019   Pneumonia    Pre-diabetes    no medications   Shortness of breath    with exertion   Sleep apnea    cpap   Testicular hypofunction      Past Surgical History:  Procedure Laterality Date   BILIARY STENT PLACEMENT N/A  03/19/2021   Procedure: BILIARY STENT PLACEMENT;  Surgeon: Lemar Lofty., MD;  Location: [facility]COPY;  Service: Gastroenterology;  Laterality: N/A;   BIV ICD INSERTION CRT-D N/A 03/27/2017   Procedure: BIV ICD INSERTION CRT-D;  Surgeon: Regan Lemming, MD;  Location: [facility] INVASIVE CV LAB;  Service: Cardiovascular;  Laterality: N/A;   CARDIAC CATHETERIZATION     '05, last 2009, showing patent RCA stent   COLONOSCOPY  12/2007   HYPERPLASTIC POLYP   COLONOSCOPY  12/2019   COLONOSCOPY WITH PROPOFOL N/A 06/02/2014   Procedure: COLONOSCOPY WITH PROPOFOL;  Surgeon: Rachael Fee, MD;  Location: [facility] ENDOSCOPY;  Service: Endoscopy;  Laterality: N/A;   CORONARY ANGIOPLASTY  08/2003   ENDOSCOPIC MUCOSAL RESECTION  03/19/2021   Procedure: ENDOSCOPIC MUCOSAL RESECTION, ampullectomy;  Surgeon: Lemar Lofty., MD;  Location: [facility] ENDOSCOPY;  Service: Gastroenterology;;   ENDOSCOPIC RETROGRADE CHOLANGIOPANCREATOGRAPHY (ERCP) WITH PROPOFOL N/A 03/19/2021   Procedure: ENDOSCOPIC RETROGRADE CHOLANGIOPANCREATOGRAPHY (ERCP) WITH PROPOFOL;  Surgeon: Lemar Lofty., MD;  Location: [facility];  Service: Gastroenterology;  Laterality: N/A;   ENDOSCOPIC RETROGRADE CHOLANGIOPANCREATOGRAPHY (ERCP) WITH PROPOFOL N/A 11/29/2021   Procedure: ENDOSCOPIC RETROGRADE CHOLANGIOPANCREATOGRAPHY (ERCP) WITH PROPOFOL;  Surgeon: Meridee Score Netty Starring., MD;  Location: [facility] ENDOSCOPY;  Service: Gastroenterology;  Laterality: N/A;   ESOPHAGOGASTRODUODENOSCOPY (EGD) WITH PROPOFOL N/A 06/02/2014   Procedure: ESOPHAGOGASTRODUODENOSCOPY (EGD) WITH PROPOFOL;  Surgeon: Rachael Fee, MD;  Location: [facility] ENDOSCOPY;  Service: Endoscopy;  Laterality: N/A;   ESOPHAGOGASTRODUODENOSCOPY (EGD) WITH PROPOFOL N/A 03/19/2021   Procedure: ESOPHAGOGASTRODUODENOSCOPY (EGD) WITH PROPOFOL;  Surgeon: Meridee Score Netty Starring., MD;  Location: [facility] ENDOSCOPY;  Service: Gastroenterology;  Laterality: N/A;   ESOPHAGOGASTRODUODENOSCOPY (EGD)  WITH PROPOFOL N/A 03/23/2021   Procedure: ESOPHAGOGASTRODUODENOSCOPY (EGD) WITH PROPOFOL;  Surgeon: Meryl Dare, MD;  Location: [facility] ENDOSCOPY;  Service: Endoscopy;  Laterality: N/A;   ESOPHAGOGASTRODUODENOSCOPY (EGD) WITH PROPOFOL N/A 03/13/2023   Procedure: ESOPHAGOGASTRODUODENOSCOPY (EGD) WITH PROPOFOL;  Surgeon: Meridee Score Netty Starring., MD;  Location: [facility] ENDOSCOPY;  Service: Gastroenterology;  Laterality: N/A;  WITH SIDE SCOPE   hernia surgery x 3     Bilateral Inguinal, Umbicial   INSERTION OF MESH N/A 03/20/2018   Procedure: INSERTION OF MESH;  Surgeon: Ovidio Kin, MD;  Location: [facility] OR;  Service: General;  Laterality: N/A;   IRRIGATION AND DEBRIDEMENT ABSCESS Left 08/14/2012   Procedure: IRRIGATION AND DEBRIDEMENT LEFT INGUINAL BOIL ;  Surgeon: Kathi Ludwig, MD;  Location: [facility] ORS;  Service: Urology;  Laterality: Left;   JOINT REPLACEMENT Right 2017   KNEE ARTHROPLASTY     LEFT HEART CATH AND CORONARY ANGIOGRAPHY N/A 01/24/2019   Procedure: LEFT HEART CATH AND CORONARY ANGIOGRAPHY;  Surgeon: Swaziland, Peter M, MD;  Location: [facility] INVASIVE CV LAB;  Service: Cardiovascular;  Laterality: N/A;   LEFT HEART CATHETERIZATION WITH CORONARY ANGIOGRAM N/A 05/26/2014   Procedure: LEFT HEART CATHETERIZATION WITH CORONARY ANGIOGRAM;  Surgeon: Micheline Chapman, MD;  Location: [facility] CATH LAB;  Service: Cardiovascular;  Laterality: N/A;   LUMBAR LAMINECTOMY/DECOMPRESSION MICRODISCECTOMY Left  09/26/2014   Procedure: Lumbar Laminectomy for resection of synovial cyst  Lumbar five- sacral one left;  Surgeon: Donalee Citrin, MD;  Location: [facility] NEURO ORS;  Service: Neurosurgery;  Laterality: Left;   MOLE REMOVAL Left 03/20/2018   Procedure: MOLE REMOVAL;  Surgeon: Ovidio Kin, MD;  Location: [facility] OR;  Service: General;  Laterality: Left;   Oral surg to removed growth from ?sinus  10/2010   ?Dermoid removed by DrRiggs   PACEMAKER INSERTION  04/03/2017   PANCREATIC STENT PLACEMENT  03/19/2021   Procedure: PANCREATIC  STENT PLACEMENT;  Surgeon: Lemar Lofty., MD;  Location: [facility] ENDOSCOPY;  Service: Gastroenterology;;   POLYPECTOMY     RCA stenting     '05 RCA   REMOVAL OF STONES  11/29/2021   Procedure: REMOVAL OF SLUDGE;  Surgeon: Meridee Score Netty Starring., MD;  Location: [facility] ENDOSCOPY;  Service: Gastroenterology;;   right toe surgery  Right    Cyst    RIGHT/LEFT HEART CATH AND CORONARY ANGIOGRAPHY N/A 01/15/2017   Procedure: RIGHT/LEFT HEART CATH AND CORONARY ANGIOGRAPHY;  Surgeon: Tonny Bollman, MD;  Location: [facility] INVASIVE CV LAB;  Service: Cardiovascular;  Laterality: N/A;   s/p right knee arthroscopy  2005   SPHINCTEROTOMY  03/19/2021   Procedure: SPHINCTEROTOMY;  Surgeon: Mansouraty, Netty Starring., MD;  Location: [facility] ENDOSCOPY;  Service: Gastroenterology;;   Francine Graven REMOVAL  11/29/2021   Procedure: STENT REMOVAL;  Surgeon: Lemar Lofty., MD;  Location: [facility] ENDOSCOPY;  Service: Gastroenterology;;   SUBMUCOSAL LIFTING INJECTION  03/19/2021   Procedure: SUBMUCOSAL LIFTING INJECTION;  Surgeon: Lemar Lofty., MD;  Location: [facility] ENDOSCOPY;  Service: Gastroenterology;;   thumb surgery Right    TOTAL KNEE ARTHROPLASTY Right 03/12/2016   Procedure: RIGHT TOTAL KNEE ARTHROPLASTY;  Surgeon: Durene Romans, MD;  Location: [facility] ORS;  Service: Orthopedics;  Laterality: Right;   TOTAL KNEE ARTHROPLASTY Left 12/10/2021   Procedure: TOTAL KNEE ARTHROPLASTY;  Surgeon: Ollen Gross, MD;  Location: [facility] ORS;  Service: Orthopedics;  Laterality: Left;   UPPER ESOPHAGEAL ENDOSCOPIC ULTRASOUND (EUS) N/A 03/19/2021   Procedure: UPPER ESOPHAGEAL ENDOSCOPIC ULTRASOUND (EUS);  Surgeon: Lemar Lofty., MD;  Location: [facility] ENDOSCOPY;  Service: Gastroenterology;  Laterality: N/A;   UPPER GASTROINTESTINAL ENDOSCOPY     VENTRAL HERNIA REPAIR N/A 03/20/2018   Procedure: LAPAROSCOPIC VENTRAL INCISIONAL  HERNIA ERAS PATHWAY;  Surgeon: Ovidio Kin, MD;  Location: [facility] OR;  Service: General;  Laterality: N/A;     Family History  Problem Relation Age of Onset   Heart disease Mother    Coronary artery disease Father    Diabetes Father    Sudden death Father        due to heart disease   Heart disease Father    COPD Brother    Arthritis Brother    Heart failure Brother    Cancer Brother        lung    Stomach cancer Paternal Grandmother    Arthritis Daughter    Hypertension Other    Colon cancer Neg Hx    Esophageal cancer Neg Hx    Colon polyps Neg Hx    Ulcerative colitis Neg Hx    Liver disease Neg Hx    Pancreatic cancer Neg Hx    Inflammatory bowel disease Neg Hx    Rectal cancer Neg Hx      Social Connections: Socially Integrated (06/06/2023)   Social Connection and Isolation Panel [NHANES]    Frequency of Communication with Friends and Family: More than three times a  week    Frequency of Social Gatherings with Friends and Family: Three times a week    Attends Religious Services: More than 4 times per year    Active Member of Clubs or Organizations: Yes    Attends Banker Meetings: More than 4 times per year    Marital Status: Married      Current Outpatient Medications:    acetaminophen (TYLENOL) 650 MG CR tablet, Take 650 mg by mouth in the morning and at bedtime., Disp: , Rfl:    albuterol (VENTOLIN HFA) 108 (90 Base) MCG/ACT inhaler, Inhale 2 puffs into the lungs every 6 (six) hours as needed for wheezing or shortness of breath., Disp: 8 g, Rfl: 0   amoxicillin-clavulanate (AUGMENTIN) 875-125 MG tablet, Take 1 tablet by mouth 2 (two) times daily., Disp: 20 tablet, Rfl: 0   atorvastatin (LIPITOR) 20 MG tablet, TAKE 1 TABLET BY MOUTH DAILY, Disp: 90 tablet, Rfl: 2   azelastine (ASTELIN) 0.1 % nasal spray, Place 2 sprays into both nostrils 2 (two) times daily as needed for rhinitis. Use in each nostril as directed, Disp: 30 mL, Rfl: 12   azithromycin (ZITHROMAX Z-PAK) 250 MG tablet, Take 2 tablets day 1 and then 1 daily for 4 days, Disp: 6 each, Rfl: 0   b  complex vitamins tablet, Take 1 tablet by mouth daily with lunch., Disp: , Rfl:    benzonatate (TESSALON) 200 MG capsule, Take 1 capsule (200 mg total) by mouth 3 (three) times daily as needed for cough., Disp: 90 capsule, Rfl: 0   betamethasone dipropionate 0.05 % cream, , Disp: , Rfl:    budesonide-formoterol (SYMBICORT) 160-4.5 MCG/ACT inhaler, Inhale 2 puffs into the lungs every 12 (twelve) hours., Disp: 10.2 g, Rfl: 3   carvedilol (COREG) 25 MG tablet, TAKE 1 TABLET BY MOUTH TWICE DAILY, Disp: 180 tablet, Rfl: 3   cholecalciferol (VITAMIN D3) 25 MCG (1000 UNIT) tablet, Take 1,000 Units by mouth daily., Disp: , Rfl:    Coenzyme Q10 (CO Q 10 PO), Take 200 mg by mouth daily with lunch., Disp: , Rfl:    ELIQUIS 5 MG TABS tablet, TAKE 1 TABLET(5 MG) BY MOUTH TWICE DAILY, Disp: 180 tablet, Rfl: 1   Flaxseed, Linseed, (FLAXSEED OIL PO), Take 1 capsule by mouth daily. With Omega 3, Disp: , Rfl:    fluocinonide (LIDEX) 0.05 % external solution, as needed., Disp: , Rfl:    folic acid (FOLVITE) 400 MCG tablet, Take 400 mcg by mouth daily., Disp: , Rfl:    Glucosamine HCl (GLUCOSAMINE PO), Take 1,500 mg by mouth every evening., Disp: , Rfl:    hydrocortisone 2.5 % cream, Apply 1 application topically 2 (two) times daily as needed (rash on nose). , Disp: , Rfl:    hydroxychloroquine (PLAQUENIL) 200 MG tablet, TAKE 1 TABLET BY MOUTH EVERY DAY ON MONDAY TO FRIDAY ONLY. DO NOT TAKE ON SATURDAY OR SUNDAY., Disp: 60 tablet, Rfl: 0   ipratropium (ATROVENT) 0.06 % nasal spray, Place 2 sprays into both nostrils 2 (two) times daily as needed for rhinitis., Disp: 15 mL, Rfl: 12   Lutein 20 MG TABS, Take 20 mg by mouth daily., Disp: , Rfl:    Multiple Vitamin (MULTIVITAMIN) capsule, Take 1 capsule by mouth daily.  , Disp: , Rfl:    nitroGLYCERIN (NITROSTAT) 0.4 MG SL tablet, Dissolve 1 tablet under the tongue every 5 minutes as needed for chest pain. Max of 3 doses, then 911., Disp: 25 tablet, Rfl: 6  NON FORMULARY,  CPAP machine with sleep., Disp: , Rfl:    pantoprazole (PROTONIX) 40 MG tablet, Take 1 tablet (40 mg total) by mouth daily. before breakfast, Disp: 90 tablet, Rfl: 3   polyethylene glycol (MIRALAX / GLYCOLAX) 17 g packet, Take 17 g by mouth daily as needed for mild constipation., Disp: 14 each, Rfl: 0   predniSONE (DELTASONE) 20 MG tablet, Take 1 tablet (20 mg total) by mouth daily with breakfast., Disp: 7 tablet, Rfl: 0   Probiotic Product (PROBIOTIC DAILY PO), Take 1 capsule by mouth daily with lunch. , Disp: , Rfl:    RESTASIS 0.05 % ophthalmic emulsion, Place 1 drop into both eyes 2 (two) times daily as needed (dry eyes)., Disp: , Rfl:    sacubitril-valsartan (ENTRESTO) 49-51 MG, Take 1 tablet by mouth 2 (two) times daily., Disp: 180 tablet, Rfl: 3   Saw Palmetto, Serenoa repens, (SAW PALMETTO PO), Take 450 mg by mouth every evening., Disp: , Rfl:    spironolactone (ALDACTONE) 25 MG tablet, Take 1 tablet (25 mg total) by mouth daily., Disp: 45 tablet, Rfl: 1   tamsulosin (FLOMAX) 0.4 MG CAPS capsule, Take 0.4 mg by mouth daily., Disp: , Rfl:    Testosterone 20 % CREA, Apply 2 mLs topically daily. Rub on shoulder, Disp: , Rfl:    tobramycin-dexamethasone (TOBRADEX) ophthalmic ointment, as needed., Disp: , Rfl:    TURMERIC PO, Take 1 tablet by mouth daily with lunch. , Disp: , Rfl:    vitamin C (ASCORBIC ACID) 500 MG tablet, Take 500 mg by mouth daily., Disp: , Rfl:    zinc gluconate 50 MG tablet, Take 50 mg by mouth daily., Disp: , Rfl:    omeprazole (PRILOSEC) 40 MG capsule, TAKE 1 CAPSULE(40 MG) BY MOUTH IN THE MORNING AND AT BEDTIME, Disp: 28 capsule, Rfl: 0   traMADol (ULTRAM) 50 MG tablet, Take 1 tablet (50 mg total) by mouth daily as needed., Disp: 30 tablet, Rfl: 0   Physical Exam:   BP 113/66 (BP Location: Left Arm, Patient Position: Sitting, Cuff Size: Normal)   Pulse 85   Ht 5\' 7"  (1.702 m)   Wt 185 lb (83.9 kg)   SpO2 90%   BMI 28.98 kg/m   Salient findings:  CN II-XII  intact  Bilateral EAC clear and TM intact with well pneumatized middle ear spaces Anterior rhinoscopy: Septum intact; bilateral inferior turbinates without significant hypertrophy No lesions of oral cavity/oropharynx No respiratory distress or stridor; easily tolerates secretions; TFL was indicated to better evaluate the proximal airway, given the patient's history and exam findings, and is detailed below.  Seprately Identifiable Procedures:  Procedure Note Pre-procedure diagnosis: Post nasal drip, chronic cough Post-procedure diagnosis: Same Procedure: Transnasal Fiberoptic Laryngoscopy, CPT 31575 - Mod 25 Indication: see above Complications: None apparent EBL: 0 mL  The procedure was undertaken to further evaluate the patient's complaint of chronic cough and post nasal drip, with mirror exam inadequate for appropriate examination due to gag reflex and poor patient tolerance  Procedure:  Patient was identified as correct patient. Verbal consent was obtained. The nose was sprayed with oxymetazoline and 4% lidocaine. The The flexible laryngoscope was passed through the nose to view the nasal cavity, pharynx (oropharynx, hypopharynx) and larynx.  The larynx was examined at rest and during multiple phonatory tasks. Documentation was obtained and reviewed with patient. The scope was removed. The patient tolerated the procedure well.  Findings: The nasal cavity and nasopharynx did not reveal any masses or lesions, mucosa  appeared to be without obvious lesions. The tongue base, pharyngeal walls, piriform sinuses, vallecula, epiglottis and postcricoid region are normal in appearance The visualized portion of the subglottis and proximal trachea is widely patent. The vocal folds are mobile bilaterally. There are no lesions on the free edge of the vocal folds nor elsewhere in the larynx worrisome for malignancy.    Electronically signed by: Read Drivers, MD 08/24/2023 1:07 PM   Impression & Plans:   Armanie Martine is a 78 y.o. male with:  1. PND (post-nasal drip)   2. Chronic cough   3. Laryngopharyngeal reflux (LPR)    TFL reassuring; cough and PND started after URI sx; slowly improving; has seen pulm, and been on inhalers and abx and steroids. We reviewed his TFL At this point, most likely etiology seems to be post viral cough; do not think neurogenic at this point; perhaps PND also playing a role so will treat for this as well  - Omeprazole BID x4 weeks - Start Astelin spray BID and atrovent spray BID PRN  F/u PRN if not improving  See below regarding exact medications prescribed this encounter including dosages and route: Meds ordered this encounter  Medications   DISCONTD: omeprazole (PRILOSEC) 40 MG capsule    Sig: Take 1 capsule (40 mg total) by mouth in the morning and at bedtime for 28 days.    Dispense:  56 capsule    Refill:  0   azelastine (ASTELIN) 0.1 % nasal spray    Sig: Place 2 sprays into both nostrils 2 (two) times daily as needed for rhinitis. Use in each nostril as directed    Dispense:  30 mL    Refill:  12   ipratropium (ATROVENT) 0.06 % nasal spray    Sig: Place 2 sprays into both nostrils 2 (two) times daily as needed for rhinitis.    Dispense:  15 mL    Refill:  12      Thank you for allowing me the opportunity to care for your patient. Please do not hesitate to contact me should you have any other questions.  Sincerely, Jovita Kussmaul, MD Otolaryngologist (ENT), [facility] Phone: [phone] Fax: [phone]  08/24/2023, 1:07 PM   MDM:  Level 4 99204 Complexity/Problems addressed: mod - multiple chronic issues now Data complexity: mod - independent review of notes, labs (multiple) - Morbidity: mod  - Prescription Drug prescribed or managed: yes

## 2023-08-06 ENCOUNTER — Other Ambulatory Visit: Payer: Self-pay | Admitting: Rheumatology

## 2023-08-06 NOTE — Telephone Encounter (Signed)
 Last Fill: 07/01/2023  UDS:07/01/2023 UDS is consistent with tramadol use.  Narc Agreement: 07/02/2023  Next Visit: 01/07/2024  Last Visit: 07/01/2023  Dx: Seropositive rheumatoid arthritis   Current Dose per office note on 07/01/2023:tramadol 50 mg p.o. p.o. nightly as needed pain    Okay to refill Tramadol?

## 2023-08-07 ENCOUNTER — Ambulatory Visit: Attending: Physician Assistant | Admitting: Physician Assistant

## 2023-08-07 VITALS — BP 122/64 | HR 77 | Ht 67.0 in | Wt 185.0 lb

## 2023-08-07 DIAGNOSIS — I5022 Chronic systolic (congestive) heart failure: Secondary | ICD-10-CM

## 2023-08-07 DIAGNOSIS — Z95810 Presence of automatic (implantable) cardiac defibrillator: Secondary | ICD-10-CM

## 2023-08-07 DIAGNOSIS — I480 Paroxysmal atrial fibrillation: Secondary | ICD-10-CM | POA: Diagnosis not present

## 2023-08-07 DIAGNOSIS — I428 Other cardiomyopathies: Secondary | ICD-10-CM | POA: Diagnosis not present

## 2023-08-07 DIAGNOSIS — D6869 Other thrombophilia: Secondary | ICD-10-CM

## 2023-08-07 DIAGNOSIS — I2510 Atherosclerotic heart disease of native coronary artery without angina pectoris: Secondary | ICD-10-CM

## 2023-08-07 LAB — CUP PACEART INCLINIC DEVICE CHECK
Date Time Interrogation Session: 20250320173247
Implantable Lead Connection Status: 753985
Implantable Lead Connection Status: 753985
Implantable Lead Connection Status: 753985
Implantable Lead Implant Date: 20181108
Implantable Lead Implant Date: 20181108
Implantable Lead Implant Date: 20181108
Implantable Lead Location: 753858
Implantable Lead Location: 753859
Implantable Lead Location: 753860
Implantable Lead Model: 4298
Implantable Lead Model: 5076
Implantable Pulse Generator Implant Date: 20181108

## 2023-08-07 NOTE — Progress Notes (Signed)
 Cardiology Office Note:  .   Date:  08/07/2023  ID:  [name], DOB [birth year], MRN [id number] PCP: Corwin Levins, MD  [facility] Providers Cardiologist:  Tonny Bollman, MD Electrophysiologist:  Regan Lemming, MD {  History of Present Illness: .   [name] is a 78 y.o. male w/PMHx of  CAD (remote PCI to the RCA), NICM, chronic CHF (systolic), LBBB, Afib,  ICD HTN, HLD, OSA (w/CPAP)   He saw Dr. Kathlynn Grate last 07/24/22, doing well, no cardiac symptoms, concerns, no changes were made.  Most recently saw Dr. Excell Seltzer 06/12/23, doing well, discussed elevated Trigs, discussed lifestyle adjustments   Today's visit is scheduled as an annual visit, reprogram shock vectors  ROS:   He feels well No changes sine his visit with dr. Excell Seltzer Denies any CP, palpitations or cardiac awareness No SOB, DOE No near syncope or syncope No bleeding or signs of bleeding  Device information MDT CRT-D implanted 03/27/2017 complicated by microperforation tx w/colchicine   Arrhythmia/AAD hx AFib found via device 2020 No AAD to date that I find  Studies Reviewed: Marland Kitchen    EKG done today and reviewed by myself SR/V paced 77bpm  DEVICE interrogation done today and reviewed by myself ~ 4 mo to RRT, lead measurements are stable <0.1% AFib burden, last in Oct 2024   07/02/22: TTE 1. Left ventricular ejection fraction, by estimation, is 50 to 55%. The left ventricle has low normal function. The left ventricle has no regional wall motion abnormalities. There is moderate concentric left ventricular hypertrophy. Left ventricular diastolic parameters are consistent with Grade I diastolic dysfunction (impaired relaxation). 2. Right ventricular systolic function is normal. The right ventricular size is normal. There is normal pulmonary artery systolic pressure. 3. Left atrial size was moderately dilated. 4. The mitral valve is normal in structure. Trivial mitral valve regurgitation. No  evidence of mitral stenosis. 5. The aortic valve is tricuspid. Aortic valve regurgitation is trivial. No aortic stenosis is present. 6. The inferior vena cava is normal in size with greater than 50% respiratory variability, suggesting right atrial pressure of 3 mmHg.  MYOCARDIAL PERFUSION IMAGING 12/12/2022 Narrative   The study shows normal perfusion. The study is intermediate risk due to mildly reduced EF on study (appears globally hypokinetic and worse in the inferior segments; does not appear to be related to V-pacing). Recommend correlation with echo for better assessment.   No ST deviation was noted.   LV perfusion is normal. There is no evidence of ischemia.   Left ventricular function is abnormal. Nuclear stress EF: 43%. The left ventricular ejection fraction is moderately decreased (30-44%). End diastolic cavity size is normal. End systolic cavity size is normal.   Prior study not available for comparison.     06/22/20: TTE IMPRESSIONS   1. Left ventricular ejection fraction, by estimation, is 50 to 55%. The  left ventricle has low normal function. The left ventricle has no regional  wall motion abnormalities. There is moderate concentric left ventricular  hypertrophy. Left ventricular  diastolic parameters are indeterminate.   2. Right ventricular systolic function is normal. The right ventricular  size is normal. There is normal pulmonary artery systolic pressure.   3. Left atrial size was mildly dilated.   4. The mitral valve is normal in structure. Mild mitral valve  regurgitation. No evidence of mitral stenosis.   5. The aortic valve is tricuspid. There is mild calcification of the  aortic valve. Aortic valve regurgitation is not  visualized. No aortic  stenosis is present.   Comparison(s): No significant change from prior study.   CARDIAC CATHETERIZATION 01/24/2019   Narrative  Mid LAD lesion is 30% stenosed.  Non-stenotic Mid RCA lesion was previously treated.  The  left ventricular systolic function is normal.  LV end diastolic pressure is normal.  The left ventricular ejection fraction is 55-65% by visual estimate.   1. Nonobstructive CAD. Continued patency of the stent in the RCA 2. Good LV systolic function 3. Normal LVEDP   Plan: will obtain an Echo. Consider a trial of colchicine. Resume Eliquis tomorrow am.     TTE 06/25/16 Review of the above records today demonstrates:  - Left ventricle: The cavity size was normal. Systolic function was   normal. The estimated ejection fraction was in the range of 55%   to 60%. Wall motion was normal; there were no regional wall   motion abnormalities. Doppler parameters are consistent with   abnormal left ventricular relaxation (grade 1 diastolic   dysfunction). - Ventricular septum: Septal motion showed dyssynergy. These   changes are consistent with a left bundle branch block. - Aortic valve: There was trivial regurgitation. - Atrial septum: There was increased thickness of the septum,   consistent with lipomatous hypertrophy.   LHC/RHC 01/05/17 1. Widely patent coronary arteries with continued patency of the stented segment in the right coronary artery 2. Mild nonobstructive coronary artery disease as detailed with mild stenosis of the proximal to mid LAD and otherwise minimal luminal irregularities 3. Well compensated right-sided cardiac hemodynamics     12/03/2016 TTE Study Conclusions  - Left ventricle: The cavity size was mildly dilated. There was    mild concentric hypertrophy. Systolic function was moderately to    severely reduced. The estimated ejection fraction was in the    range of 30% to 35%. Wall motion was normal; there were no    regional wall motion abnormalities. Features are consistent with    a pseudonormal left ventricular filling pattern, with concomitant    abnormal relaxation and increased filling pressure (grade 2    diastolic dysfunction). Doppler parameters are  consistent with    elevated ventricular end-diastolic filling pressure.  - Aortic valve: There was trivial regurgitation.  - Mitral valve: There was moderate to severe regurgitation. Valve    area by pressure half-time: 1.59 cm^2.  - Left atrium: The atrium was moderately dilated.  - Right ventricle: Systolic function was normal.  - Pulmonic valve: There was mild regurgitation.  - Pulmonary arteries: Systolic pressure was mildly increased. PA    peak pressure: 36 mm Hg (S).  - Inferior vena cava: The vessel was normal in size. The    respirophasic diameter changes were in the normal range (= 50%),    consistent with normal central venous pressure.   Impressions:  - When compared to the prior study from 03/08/2015 LVEF has    decreased to 30-35% with diffuse hypokinesis and significant    septal-lateral wall dyssynchrony.    Risk Assessment/Calculations:    Physical Exam:   VS:  There were no vitals taken for this visit.   Wt Readings from Last 3 Encounters:  08/04/23 185 lb (83.9 kg)  07/14/23 187 lb 12.8 oz (85.2 kg)  07/01/23 187 lb 6.4 oz (85 kg)    GEN: Well nourished, well developed in no acute distress NECK: No JVD; No carotid bruits CARDIAC: RRR, no murmurs, rubs, gallops RESPIRATORY:  CTA b/l without rales, wheezing or rhonchi  ABDOMEN: Soft, non-tender, non-distended EXTREMITIES: No edema; No deformity   ICD site: is stable, no thinning, fluctuation, tethering  ASSESSMENT AND PLAN: .    ICD intact function HV vector B>AX changed where needed  ~ 50mo to RRT  paroxysmal AFib CHA2DS2Vasc is 5, on Eliquis, appropriately dosed <0.1 % burden  NICM Chronic CHF Recovered LVEF No symptoms or exam findings of volume OL OptiVol looks great C/w Dr. Sondra Barges  CAD No symptoms C/w Dr. Sondra Barges  Secondary hypercoagulable state 2/2 AFib     Dispo: remotes as usual, back in 4 mo, sooner if needed  Signed, Sheilah Pigeon, PA-C

## 2023-08-07 NOTE — Patient Instructions (Signed)
 Medication Instructions:   Your physician recommends that you continue on your current medications as directed. Please refer to the Current Medication list given to you today.  *If you need a refill on your cardiac medications before your next appointment, please call your pharmacy*   Lab Work: NONE ORDERED  TODAY    If you have labs (blood work) drawn today and your tests are completely normal, you will receive your results only by: MyChart Message (if you have MyChart) OR A paper copy in the mail If you have any lab test that is abnormal or we need to change your treatment, we will call you to review the results.   Testing/Procedures: NONE ORDERED  TODAY    Follow-Up: At [facility], you and your health needs are our priority.  As part of our continuing mission to provide you with exceptional heart care, we have created designated Provider Care Teams.  These Care Teams include your primary Cardiologist (physician) and Advanced Practice Providers (APPs -  Physician Assistants and Nurse Practitioners) who all work together to provide you with the care you need, when you need it.  We recommend signing up for the patient portal called "MyChart".  Sign up information is provided on this After Visit Summary.  MyChart is used to connect with patients for Virtual Visits (Telemedicine).  Patients are able to view lab/test results, encounter notes, upcoming appointments, etc.  Non-urgent messages can be sent to your provider as well.   To learn more about what you can do with MyChart, go to [email or web address].    Your next appointment:    4 month(s)  FOR DEVICE CHANGE  ( CONTACT  CASSIE HALL/ ANGELINE HAMMER FOR EP SCHEDULING ISSUES )   Provider:    Francis Dowse, PA-C    Other Instructions

## 2023-08-08 LAB — CUP PACEART INCLINIC DEVICE CHECK
Battery Remaining Longevity: 4 mo
Battery Voltage: 2.81 V
Brady Statistic AP VP Percent: 2.68 %
Brady Statistic AP VS Percent: 0.05 %
Brady Statistic AS VP Percent: 92.83 %
Brady Statistic AS VS Percent: 4.45 %
Brady Statistic RA Percent Paced: 2.7 %
Brady Statistic RV Percent Paced: 39.26 %
Date Time Interrogation Session: 20250320153700
HighPow Impedance: 66 Ohm
Implantable Lead Connection Status: 753985
Implantable Lead Connection Status: 753985
Implantable Lead Connection Status: 753985
Implantable Lead Implant Date: 20181108
Implantable Lead Implant Date: 20181108
Implantable Lead Implant Date: 20181108
Implantable Lead Location: 753858
Implantable Lead Location: 753859
Implantable Lead Location: 753860
Implantable Lead Model: 4298
Implantable Lead Model: 5076
Implantable Pulse Generator Implant Date: 20181108
Lead Channel Impedance Value: 1121 Ohm
Lead Channel Impedance Value: 1140 Ohm
Lead Channel Impedance Value: 1197 Ohm
Lead Channel Impedance Value: 204.14 Ohm
Lead Channel Impedance Value: 224.438
Lead Channel Impedance Value: 268.078
Lead Channel Impedance Value: 276.523
Lead Channel Impedance Value: 315.129
Lead Channel Impedance Value: 399 Ohm
Lead Channel Impedance Value: 418 Ohm
Lead Channel Impedance Value: 418 Ohm
Lead Channel Impedance Value: 513 Ohm
Lead Channel Impedance Value: 532 Ohm
Lead Channel Impedance Value: 608 Ohm
Lead Channel Impedance Value: 646 Ohm
Lead Channel Impedance Value: 703 Ohm
Lead Channel Impedance Value: 817 Ohm
Lead Channel Impedance Value: 817 Ohm
Lead Channel Pacing Threshold Amplitude: 0.5 V
Lead Channel Pacing Threshold Amplitude: 0.5 V
Lead Channel Pacing Threshold Amplitude: 2.75 V
Lead Channel Pacing Threshold Pulse Width: 0.4 ms
Lead Channel Pacing Threshold Pulse Width: 0.4 ms
Lead Channel Pacing Threshold Pulse Width: 1 ms
Lead Channel Sensing Intrinsic Amplitude: 11.75 mV
Lead Channel Sensing Intrinsic Amplitude: 12.25 mV
Lead Channel Sensing Intrinsic Amplitude: 2.75 mV
Lead Channel Sensing Intrinsic Amplitude: 2.875 mV
Lead Channel Setting Pacing Amplitude: 2 V
Lead Channel Setting Pacing Amplitude: 2.5 V
Lead Channel Setting Pacing Amplitude: 3 V
Lead Channel Setting Pacing Pulse Width: 0.4 ms
Lead Channel Setting Pacing Pulse Width: 1 ms
Lead Channel Setting Sensing Sensitivity: 0.3 mV
Zone Setting Status: 755011
Zone Setting Status: 755011

## 2023-08-18 ENCOUNTER — Ambulatory Visit: Payer: Medicare Other | Attending: Cardiology

## 2023-08-18 DIAGNOSIS — Z95810 Presence of automatic (implantable) cardiac defibrillator: Secondary | ICD-10-CM

## 2023-08-18 DIAGNOSIS — I5022 Chronic systolic (congestive) heart failure: Secondary | ICD-10-CM

## 2023-08-21 ENCOUNTER — Telehealth: Payer: Self-pay

## 2023-08-21 NOTE — Telephone Encounter (Signed)
 Remote ICM transmission received.  Attempted call to patient regarding ICM remote transmission and left detailed message per DPR.  Left ICM phone number and advised to return call for any fluid symptoms or questions. Next ICM remote transmission scheduled 09/22/2023.

## 2023-08-21 NOTE — Progress Notes (Signed)
 EPIC Encounter for ICM Monitoring  Patient Name: [name] is a 78 y.o. male Date: 08/21/2023 Primary Care Physican: Corwin Levins, MD Primary Cardiologist: Excell Seltzer Electrophysiologist: Elberta Fortis Bi-V Pacing:   97.6%   07/14/2023 Office Weight: 187 lbs   Time in AT/AF 0.0 hr/day (0.0%) Battery Longevity: 4 months                                                          Attempted call to patient and unable to reach.  Left detailed message per DPR regarding transmission.  Transmission results reviewed.    Diet:  Does eat restaurant foods.     Optivol thoracic impedance suggesting normal fluid levels within the last month.   Prescribed: Spironolactone 25 mg take 0.5 tablet daily.   Labs: 06/10/2023 Creatinine 0.89, BUN 23, Potassium 3.8, Sodium 139, GFR 81.65 A complete set of results can be found in Results Review.   Recommendations:  Left voice mail with ICM number and encouraged to call if experiencing any fluid symptoms.   Follow-up plan: ICM clinic phone appointment on 09/22/2023.   91 day device clinic remote transmission 10/30/2023.       EP/Cardiology Office Visits:  01/07/2024 with Dr Excell Seltzer.       Copy of ICM check sent to Dr. Elberta Fortis.     3 month ICM trend: 08/18/2023.    12-14 Month ICM trend:     Karie Soda, RN 08/21/2023 12:37 PM

## 2023-09-01 ENCOUNTER — Ambulatory Visit (INDEPENDENT_AMBULATORY_CARE_PROVIDER_SITE_OTHER)

## 2023-09-01 DIAGNOSIS — I428 Other cardiomyopathies: Secondary | ICD-10-CM

## 2023-09-02 ENCOUNTER — Other Ambulatory Visit: Payer: Self-pay | Admitting: Physician Assistant

## 2023-09-02 NOTE — Telephone Encounter (Signed)
 Last Fill: 08/06/2023   UDS:07/01/2023 UDS is consistent with tramadol use.   Narc Agreement: 07/02/2023   Next Visit: 01/07/2024   Last Visit: 07/01/2023   Dx: Seropositive rheumatoid arthritis    Current Dose per office note on 07/01/2023:tramadol 50 mg p.o. p.o. nightly as needed pain      Okay to refill Tramadol?

## 2023-09-03 LAB — CUP PACEART REMOTE DEVICE CHECK
Battery Remaining Longevity: 3 mo
Battery Voltage: 2.79 V
Brady Statistic AP VP Percent: 0.78 %
Brady Statistic AP VS Percent: 0.03 %
Brady Statistic AS VP Percent: 97.78 %
Brady Statistic AS VS Percent: 1.41 %
Brady Statistic RA Percent Paced: 0.81 %
Brady Statistic RV Percent Paced: 42.76 %
Date Time Interrogation Session: 20250416003324
HighPow Impedance: 71 Ohm
Implantable Lead Connection Status: 753985
Implantable Lead Connection Status: 753985
Implantable Lead Connection Status: 753985
Implantable Lead Implant Date: 20181108
Implantable Lead Implant Date: 20181108
Implantable Lead Implant Date: 20181108
Implantable Lead Location: 753858
Implantable Lead Location: 753859
Implantable Lead Location: 753860
Implantable Lead Model: 4298
Implantable Lead Model: 5076
Implantable Pulse Generator Implant Date: 20181108
Lead Channel Impedance Value: 1140 Ohm
Lead Channel Impedance Value: 1178 Ohm
Lead Channel Impedance Value: 1254 Ohm
Lead Channel Impedance Value: 212.8 Ohm
Lead Channel Impedance Value: 231.42 Ohm
Lead Channel Impedance Value: 270.092
Lead Channel Impedance Value: 295.059
Lead Channel Impedance Value: 332.11 Ohm
Lead Channel Impedance Value: 399 Ohm
Lead Channel Impedance Value: 418 Ohm
Lead Channel Impedance Value: 456 Ohm
Lead Channel Impedance Value: 551 Ohm
Lead Channel Impedance Value: 551 Ohm
Lead Channel Impedance Value: 665 Ohm
Lead Channel Impedance Value: 703 Ohm
Lead Channel Impedance Value: 722 Ohm
Lead Channel Impedance Value: 817 Ohm
Lead Channel Impedance Value: 836 Ohm
Lead Channel Pacing Threshold Amplitude: 0.375 V
Lead Channel Pacing Threshold Amplitude: 0.625 V
Lead Channel Pacing Threshold Amplitude: 3 V
Lead Channel Pacing Threshold Pulse Width: 0.4 ms
Lead Channel Pacing Threshold Pulse Width: 0.4 ms
Lead Channel Pacing Threshold Pulse Width: 1 ms
Lead Channel Sensing Intrinsic Amplitude: 11.5 mV
Lead Channel Sensing Intrinsic Amplitude: 11.5 mV
Lead Channel Sensing Intrinsic Amplitude: 2.625 mV
Lead Channel Sensing Intrinsic Amplitude: 2.625 mV
Lead Channel Setting Pacing Amplitude: 2 V
Lead Channel Setting Pacing Amplitude: 2.5 V
Lead Channel Setting Pacing Amplitude: 3 V
Lead Channel Setting Pacing Pulse Width: 0.4 ms
Lead Channel Setting Pacing Pulse Width: 1 ms
Lead Channel Setting Sensing Sensitivity: 0.3 mV
Zone Setting Status: 755011
Zone Setting Status: 755011

## 2023-09-10 ENCOUNTER — Other Ambulatory Visit: Payer: Self-pay | Admitting: Rheumatology

## 2023-09-10 DIAGNOSIS — M1990 Unspecified osteoarthritis, unspecified site: Secondary | ICD-10-CM

## 2023-09-10 MED ORDER — HYDROXYCHLOROQUINE SULFATE 200 MG PO TABS: ORAL_TABLET | ORAL | 0 refills | Status: DC

## 2023-09-10 NOTE — Telephone Encounter (Signed)
 Pt called stating he's at the [facility] for his medication and they stated that they never received a refill form back for the Plaquenil .   Patient contacted the office to request a medication refill.   1. Name of Medication: Plaquenil   2. How are you currently taking this medication (dosage and times per day)? 1 tablet Monday and Friday    3. What pharmacy would you like for that to be sent to? [facility]- on [address]

## 2023-09-10 NOTE — Telephone Encounter (Signed)
 Last Fill: 06/30/2023  Eye exam: 10/08/2022 WNL    Labs: 06/10/2023 WBC 11.3, Monocytes Absolute 1.2  Next Visit: 01/07/2024  Last Visit: 07/01/2023  [id number] rheumatoid arthritis   Current Dose per office note 07/01/2023: Plaquenil  200 mg p.o. daily Monday to Friday   Okay to refill Plaquenil ?

## 2023-09-10 NOTE — Telephone Encounter (Signed)
 Attempted to contact the patient and left message for patient to call the office to verify pharmacy.

## 2023-09-10 NOTE — Telephone Encounter (Signed)
 Patient returned call to the office and verified that his pharmacy is [facility] [address]. Patient states he has not used [facility] in 20 years.

## 2023-09-12 NOTE — Progress Notes (Signed)
 Remote ICD transmission.

## 2023-09-22 ENCOUNTER — Ambulatory Visit: Attending: Cardiology

## 2023-09-22 DIAGNOSIS — I5022 Chronic systolic (congestive) heart failure: Secondary | ICD-10-CM

## 2023-09-22 DIAGNOSIS — Z95810 Presence of automatic (implantable) cardiac defibrillator: Secondary | ICD-10-CM

## 2023-09-24 ENCOUNTER — Encounter: Payer: Self-pay | Admitting: Pulmonary Disease

## 2023-09-24 ENCOUNTER — Ambulatory Visit (INDEPENDENT_AMBULATORY_CARE_PROVIDER_SITE_OTHER): Admitting: Pulmonary Disease

## 2023-09-24 VITALS — BP 101/64 | HR 76 | Ht 67.0 in | Wt 187.2 lb

## 2023-09-24 DIAGNOSIS — Z9981 Dependence on supplemental oxygen: Secondary | ICD-10-CM

## 2023-09-24 DIAGNOSIS — R0981 Nasal congestion: Secondary | ICD-10-CM

## 2023-09-24 DIAGNOSIS — G4733 Obstructive sleep apnea (adult) (pediatric): Secondary | ICD-10-CM

## 2023-09-24 DIAGNOSIS — R059 Cough, unspecified: Secondary | ICD-10-CM

## 2023-09-24 DIAGNOSIS — Z87891 Personal history of nicotine dependence: Secondary | ICD-10-CM

## 2023-09-24 NOTE — Patient Instructions (Signed)
 I will see you a year from now  Your CPAP download shows it is working well  You do not have to use the Symbicort  as long as the cough stays controlled  If you need anything for cough just give us  a call  Continue to stay active

## 2023-09-24 NOTE — Progress Notes (Signed)
 [name]    [id number]    07-09-1945  Primary Care Physician:[name], Alveda Aures, MD  Referring Physician: Roslyn Coombe, MD [address] [place],  [place] [address]  Chief complaint:   Patient seen today for obstructive sleep apnea  HPI:  Has been doing relatively well since her last visit  Just got a new CPAP In for follow-up today  Breathing is a whole lot better He does try to stay active doing about 10,000 steps a day  Followed up with ENT recently  Follows up with cardiology on a regular basis and has been doing relatively well  His coughing has significantly improved and is no longer using Symbicort   He tolerates his CPAP well without significant problems Wakes up in the morning feeling like he is at a good nights rest   Outpatient Encounter Medications as of 09/24/2023  Medication Sig   acetaminophen  (TYLENOL ) 650 MG CR tablet Take 650 mg by mouth in the morning and at bedtime.   amoxicillin -clavulanate (AUGMENTIN ) 875-125 MG tablet Take 1 tablet by mouth 2 (two) times daily.   atorvastatin  (LIPITOR) 20 MG tablet TAKE 1 TABLET BY MOUTH DAILY   b complex vitamins tablet Take 1 tablet by mouth daily with lunch.   betamethasone dipropionate 0.05 % cream    carvedilol  (COREG ) 25 MG tablet TAKE 1 TABLET BY MOUTH TWICE DAILY   cholecalciferol (VITAMIN D3) 25 MCG (1000 UNIT) tablet Take 1,000 Units by mouth daily.   Coenzyme Q10 (CO Q 10 PO) Take 200 mg by mouth daily with lunch.   ELIQUIS  5 MG TABS tablet TAKE 1 TABLET(5 MG) BY MOUTH TWICE DAILY   Flaxseed, Linseed, (FLAXSEED OIL PO) Take 1 capsule by mouth daily. With Omega 3   fluocinonide (LIDEX) 0.05 % external solution as needed.   folic acid  (FOLVITE ) 400 MCG tablet Take 400 mcg by mouth daily.   Glucosamine HCl (GLUCOSAMINE PO) Take 1,500 mg by mouth every evening.   hydrocortisone  2.5 % cream Apply 1 application topically 2 (two) times daily as needed (rash on nose).    hydroxychloroquine   (PLAQUENIL ) 200 MG tablet TAKE 1 TABLET BY MOUTH EVERY DAY ON MONDAY TO FRIDAY ONLY. DO NOT TAKE ON SATURDAY OR SUNDAY.   Lutein  20 MG TABS Take 20 mg by mouth daily.   Multiple Vitamin (MULTIVITAMIN) capsule Take 1 capsule by mouth daily.     nitroGLYCERIN  (NITROSTAT ) 0.4 MG SL tablet Dissolve 1 tablet under the tongue every 5 minutes as needed for chest pain. Max of 3 doses, then 911.   NON FORMULARY CPAP machine with sleep.   Probiotic Product (PROBIOTIC DAILY PO) Take 1 capsule by mouth daily with lunch.    RESTASIS  0.05 % ophthalmic emulsion Place 1 drop into both eyes 2 (two) times daily as needed (dry eyes).   sacubitril -valsartan  (ENTRESTO ) 49-51 MG Take 1 tablet by mouth 2 (two) times daily.   Saw Palmetto, Serenoa repens, (SAW PALMETTO PO) Take 450 mg by mouth every evening.   spironolactone  (ALDACTONE ) 25 MG tablet Take 1 tablet (25 mg total) by mouth daily.   tamsulosin  (FLOMAX ) 0.4 MG CAPS capsule Take 0.4 mg by mouth daily.   Testosterone  20 % CREA Apply 2 mLs topically daily. Rub on shoulder   tobramycin-dexamethasone  (TOBRADEX) ophthalmic ointment as needed.   traMADol  (ULTRAM ) 50 MG tablet Take 1 tablet (50 mg total) by mouth daily as needed.   TURMERIC PO Take 1 tablet by mouth  daily with lunch.    vitamin C (ASCORBIC ACID) 500 MG tablet Take 500 mg by mouth daily.   zinc gluconate 50 MG tablet Take 50 mg by mouth daily.   albuterol  (VENTOLIN  HFA) 108 (90 Base) MCG/ACT inhaler Inhale 2 puffs into the lungs every 6 (six) hours as needed for wheezing or shortness of breath. (Patient not taking: Reported on 09/24/2023)   azelastine  (ASTELIN ) 0.1 % nasal spray Place 2 sprays into both nostrils 2 (two) times daily as needed for rhinitis. Use in each nostril as directed   azithromycin  (ZITHROMAX  Z-PAK) 250 MG tablet Take 2 tablets day 1 and then 1 daily for 4 days (Patient not taking: Reported on 09/24/2023)   benzonatate  (TESSALON ) 200 MG capsule Take 1 capsule (200 mg total) by mouth 3  (three) times daily as needed for cough. (Patient not taking: Reported on 09/24/2023)   budesonide -formoterol  (SYMBICORT ) 160-4.5 MCG/ACT inhaler Inhale 2 puffs into the lungs every 12 (twelve) hours. (Patient not taking: Reported on 09/24/2023)   ipratropium (ATROVENT ) 0.06 % nasal spray Place 2 sprays into both nostrils 2 (two) times daily as needed for rhinitis. (Patient not taking: Reported on 09/24/2023)   omeprazole  (PRILOSEC) 40 MG capsule TAKE 1 CAPSULE(40 MG) BY MOUTH IN THE MORNING AND AT BEDTIME   pantoprazole  (PROTONIX ) 40 MG tablet Take 1 tablet (40 mg total) by mouth daily. before breakfast (Patient not taking: Reported on 09/24/2023)   polyethylene glycol (MIRALAX  / GLYCOLAX ) 17 g packet Take 17 g by mouth daily as needed for mild constipation. (Patient not taking: Reported on 09/24/2023)   predniSONE  (DELTASONE ) 20 MG tablet Take 1 tablet (20 mg total) by mouth daily with breakfast. (Patient not taking: Reported on 09/24/2023)   No facility-administered encounter medications on file as of 09/24/2023.    Allergies as of 09/24/2023 - Review Complete 09/24/2023  Allergen Reaction Noted   Sulfonamide derivatives Rash 06/26/2010    Past Medical History:  Diagnosis Date   AICD (automatic cardioverter/defibrillator) present    Allergic rhinitis    Allergy    Anginal pain (HCC) 05/26/14   chest pain after chasing dog   Atypical nevus of back 04/27/2003   moderate - mid lower back   Basal cell carcinoma 01/26/2008   right cheek - MOHs   CAD (coronary artery disease)    hx of stent- 2005 RCA   Cataract    beginning stage both eyes   CHF (congestive heart failure) (HCC)    pacemaker Medtronic     Chronic back pain    intermittent   Constipation    Dizziness    Dysrhythmia    right bundle branch block    Esophageal stricture    GERD (gastroesophageal reflux disease)    Hemoptysis    Hiatal hernia    History of colonic polyps    hyperplastic   HTN (hypertension)    Hyperlipidemia     Hypertrophy of prostate with urinary obstruction and other lower urinary tract symptoms (LUTS)    OA (osteoarthritis)    OSA (obstructive sleep apnea)    cpap- 10    Other specified disorder of stomach and duodenum    duodenal periampulary tubulovillous adenoma removed by Dr. Howard Macho 5/10   Pericarditis 07/06/2019   Pneumonia    Pre-diabetes    no medications   Shortness of breath    with exertion   Sleep apnea    cpap   Testicular hypofunction     Past Surgical History:  Procedure Laterality  Date   BILIARY STENT PLACEMENT N/A 03/19/2021   Procedure: BILIARY STENT PLACEMENT;  Surgeon: Brice Campi Albino Alu., MD;  Location: [facility];  Service: Gastroenterology;  Laterality: N/A;   BIV ICD INSERTION CRT-D N/A 03/27/2017   Procedure: BIV ICD INSERTION CRT-D;  Surgeon: Lei Pump, MD;  Location: [facility] INVASIVE CV LAB;  Service: Cardiovascular;  Laterality: N/A;   CARDIAC CATHETERIZATION     '05, last 2009, showing patent RCA stent   COLONOSCOPY  12/2007   HYPERPLASTIC POLYP   COLONOSCOPY  12/2019   COLONOSCOPY WITH PROPOFOL  N/A 06/02/2014   Procedure: COLONOSCOPY WITH PROPOFOL ;  Surgeon: Janel Medford, MD;  Location: [facility] ENDOSCOPY;  Service: Endoscopy;  Laterality: N/A;   CORONARY ANGIOPLASTY  08/2003   ENDOSCOPIC MUCOSAL RESECTION  03/19/2021   Procedure: ENDOSCOPIC MUCOSAL RESECTION, ampullectomy;  Surgeon: Normie Becton., MD;  Location: [facility] ENDOSCOPY;  Service: Gastroenterology;;   ENDOSCOPIC RETROGRADE CHOLANGIOPANCREATOGRAPHY (ERCP) WITH PROPOFOL  N/A 03/19/2021   Procedure: ENDOSCOPIC RETROGRADE CHOLANGIOPANCREATOGRAPHY (ERCP) WITH PROPOFOL ;  Surgeon: Normie Becton., MD;  Location: [facility] ENDOSCOPY;  Service: Gastroenterology;  Laterality: N/A;   ENDOSCOPIC RETROGRADE CHOLANGIOPANCREATOGRAPHY (ERCP) WITH PROPOFOL  N/A 11/29/2021   Procedure: ENDOSCOPIC RETROGRADE CHOLANGIOPANCREATOGRAPHY (ERCP) WITH PROPOFOL ;  Surgeon: Brice Campi Albino Alu., MD;  Location: [facility]  ENDOSCOPY;  Service: Gastroenterology;  Laterality: N/A;   ESOPHAGOGASTRODUODENOSCOPY (EGD) WITH PROPOFOL  N/A 06/02/2014   Procedure: ESOPHAGOGASTRODUODENOSCOPY (EGD) WITH PROPOFOL ;  Surgeon: Janel Medford, MD;  Location: [facility] ENDOS[facility];  Service: Endoscopy;  Laterality: N/A;   ESOPHAGOGASTRODUODENOSCOPY (EGD) WITH PROPOFOL  N/A 03/19/2021   Procedure: ESOPHAGOGASTRODUODENOSCOPY (EGD) WITH PROPOFOL ;  Surgeon: Brice Campi Albino Alu., MD;  Location: [facility] ENDOS[facility];  Service: Gastroenterology;  Laterality: N/A;   ESOPHAGOGASTRODUODENOSCOPY (EGD) WITH PROPOFOL  N/A 03/23/2021   Procedure: ESOPHAGOGASTRODUODENOSCOPY (EGD) WITH PROPOFOL ;  Surgeon: Asencion Blacksmith, MD;  Location: [facility];  Service: Endoscopy;  Laterality: N/A;   ESOPHAGOGASTRODUODENOSCOPY (EGD) WITH PROPOFOL  N/A 03/13/2023   Procedure: ESOPHAGOGASTRODUODENOSCOPY (EGD) WITH PROPOFOL ;  Surgeon: Brice Campi Albino Alu., MD;  Location: [facility] END[facility];  Service: Gastroenterology;  Laterality: N/A;  WITH SIDE SCOPE   hernia surgery x 3     Bilateral Inguinal, Umbicial   INSERTION OF MESH N/A 03/20/2018   Procedure: INSERTION OF MESH;  Surgeon: Juanita Norlander, MD;  Location: [facility] OR;  Service: General;  Laterality: N/A;   IRRIGATION AND DEBRIDEMENT ABSCESS Left 08/14/2012   Procedure: IRRIGATION AND DEBRIDEMENT LEFT INGUINAL BOIL ;  Surgeon: Edmund Gouge, MD;  Location: [facility] ORS;  Service: Urology;  Laterality: Left;   JOINT REPLACEMENT Right 2017   KNEE ARTHROPLASTY     LEFT HEART CATH AND CORONARY ANGIOGRAPHY N/A 01/24/2019   Procedure: LEFT HEART CATH AND CORONARY ANGIOGRAPHY;  Surgeon: Swaziland, Peter M, MD;  Location: [facility] INVASIVE CV LAB;  Service: Cardiovascular;  Laterality: N/A;   LEFT HEART CATHETERIZATION WITH CORONARY ANGIOGRAM N/A 05/26/2014   Procedure: LEFT HEART CATHETERIZATION WITH CORONARY ANGIOGRAM;  Surgeon: Arlander Bellman, MD;  Location: [facility] CATH LAB;  Service: Cardiovascular;  Laterality: N/A;   LUMBAR LAMINECTOMY/DECOMPRESSION  MICRODISCECTOMY Left 09/26/2014   Procedure: Lumbar Laminectomy for resection of synovial cyst  Lumbar five- sacral one left;  Surgeon: Gearl Keens, MD;  Location: [facility] NEURO ORS;  Service: Neurosurgery;  Laterality: Left;   MOLE REMOVAL Left 03/20/2018   Procedure: MOLE REMOVAL;  Surgeon: Juanita Norlander, MD;  Location: [facility] OR;  Service: General;  Laterality: Left;   Oral surg to removed growth from ?sinus  10/2010   ?Dermoid removed by DrRiggs   PACEMAKER INSERTION  04/03/2017   PANCREATIC STENT PLACEMENT  03/19/2021   Procedure: PANCREATIC STENT PLACEMENT;  Surgeon: Normie Becton., MD;  Location: [facility] ENDOSCOPY;  Service: Gastroenterology;;   POLYPECTOMY     RCA stenting     '05 RCA   REMOVAL OF STONES  11/29/2021   Procedure: REMOVAL OF SLUDGE;  Surgeon: Brice Campi Albino Alu., MD;  Location: [name] ENDOSCOPY;  Service: Gastroenterology;;   right toe surgery  Right    Cyst    RIGHT/LEFT HEART CATH AND CORONARY ANGIOGRAPHY N/A 01/15/2017   Procedure: RIGHT/LEFT HEART CATH AND CORONARY ANGIOGRAPHY;  Surgeon: Arnoldo Lapping, MD;  Location: [facility] INVASIVE CV LAB;  Service: Cardiovascular;  Laterality: N/A;   s/p right knee arthroscopy  2005   SPHINCTEROTOMY  03/19/2021   Procedure: SPHINCTEROTOMY;  Surgeon: Mansouraty, Albino Alu., MD;  Location: [facility];  Service: Gastroenterology;;   Yuvonne Herald REMOVAL  11/29/2021   Procedure: STENT REMOVAL;  Surgeon: Normie Becton., MD;  Location: [facility] ENDOSCOPY;  Service: Gastroenterology;;   SUBMUCOSAL LIFTING INJECTION  03/19/2021   Procedure: SUBMUCOSAL LIFTING INJECTION;  Surgeon: Normie Becton., MD;  Location: [facility];  Service: Gastroenterology;;   thumb surgery Right    TOTAL KNEE ARTHROPLASTY Right 03/12/2016   Procedure: RIGHT TOTAL KNEE ARTHROPLASTY;  Surgeon: Claiborne Crew, MD;  Location: [facility] ORS;  Service: Orthopedics;  Laterality: Right;   TOTAL KNEE ARTHROPLASTY Left 12/10/2021   Procedure: TOTAL KNEE ARTHROPLASTY;  Surgeon:  Liliane Rei, MD;  Location: [facility] ORS;  Service: Orthopedics;  Laterality: Left;   UPPER ESOPHAGEAL ENDOSCOPIC ULTRASOUND (EUS) N/A 03/19/2021   Procedure: UPPER ESOPHAGEAL ENDOSCOPIC ULTRASOUND (EUS);  Surgeon: Normie Becton., MD;  Location: [facility];  Service: Gastroenterology;  Laterality: N/A;   UPPER GASTROINTESTINAL ENDOSCOPY     VENTRAL HERNIA REPAIR N/A 03/20/2018   Procedure: LAPAROSCOPIC VENTRAL INCISIONAL  HERNIA ERAS PATHWAY;  Surgeon: Juanita Norlander, MD;  Location: [facility] OR;  Service: General;  Laterality: N/A;    Family History  Problem Relation Age of Onset   Heart disease Mother    Coronary artery disease Father    Diabetes Father    Sudden death Father        due to heart disease   Heart disease Father    COPD Brother    Arthritis Brother    Heart failure Brother    Cancer Brother        lung    Stomach cancer Paternal Grandmother    Arthritis Daughter    Hypertension Other    Colon cancer Neg Hx    Esophageal cancer Neg Hx    Colon polyps Neg Hx    Ulcerative colitis Neg Hx    Liver disease Neg Hx    Pancreatic cancer Neg Hx    Inflammatory bowel disease Neg Hx    Rectal cancer Neg Hx     Social History   Socioeconomic History   Marital status: Married    Spouse name: Not on file   Number of children: 1   Years of education: Not on file   Highest education level: Bachelor's degree (e.g., BA, AB, BS)  Occupational History   Occupation: Advertising account planner: [employer]  Tobacco Use   Smoking status: Former    Current packs/day: 0.00    Types: Cigarettes    Quit date: 05/20/1990    Years since quitting: 33.3    Passive exposure: Never   Smokeless tobacco: Never   Tobacco comments:    previous 30  pack year history  Vaping Use   Vaping status: Never Used  Substance and Sexual Activity   Alcohol  use: Yes    Comment: rare   Drug use: Never   Sexual activity: Not on file  Other Topics Concern   Not on file  Social  History Narrative   Not on file   Social Drivers of Health   Financial Resource Strain: Low Risk  (06/06/2023)   Overall Financial Resource Strain (CARDIA)    Difficulty of Paying Living Expenses: Not hard at all  Food Insecurity: No Food Insecurity (06/06/2023)   Hunger Vital Sign    Worried About Running Out of Food in the Last Year: Never true    Ran Out of Food in the Last Year: Never true  Transportation Needs: No Transportation Needs (06/06/2023)   PRAPARE - Administrator, Civil Service (Medical): No    Lack of Transportation (Non-Medical): No  Physical Activity: Sufficiently Active (06/06/2023)   Exercise Vital Sign    Days of Exercise per Week: 5 days    Minutes of Exercise per Session: 30 min  Stress: No Stress Concern Present (06/06/2023)   [employer] of Occupational Health - Occupational Stress Questionnaire    Feeling of Stress : Only a little  Social Connections: Socially Integrated (06/06/2023)   Social Connection and Isolation Panel [NHANES]    Frequency of Communication with Friends and Family: More than three times a week    Frequency of Social Gatherings with Friends and Family: Three times a week    Attends Religious Services: More than 4 times per year    Active Member of Clubs or Organizations: Yes    Attends Banker Meetings: More than 4 times per year    Marital Status: Married  Catering manager Violence: Not At Risk (04/02/2023)   Humiliation, Afraid, Rape, and Kick questionnaire    Fear of Current or Ex-Partner: No    Emotionally Abused: No    Physically Abused: No    Sexually Abused: No    Review of Systems  Respiratory:  Positive for apnea, cough and shortness of breath.   Psychiatric/Behavioral:  Positive for sleep disturbance.     There were no vitals filed for this visit.    Physical Exam Constitutional:      Appearance: Normal appearance.  HENT:     Head: Normocephalic.     Mouth/Throat:     Mouth: Mucous  membranes are moist.  Eyes:     General: No scleral icterus.    Pupils: Pupils are equal, round, and reactive to light.  Cardiovascular:     Rate and Rhythm: Normal rate and regular rhythm.     Heart sounds: No murmur heard.    No friction rub.  Pulmonary:     Effort: No respiratory distress.     Breath sounds: No stridor. No wheezing or rhonchi.  Musculoskeletal:     Cervical back: No rigidity or tenderness.  Neurological:     Mental Status: He is alert.  Psychiatric:        Mood and Affect: Mood normal.    Data Reviewed: Download from his machine shows good compliance Compliance continues to be excellent Auto CPAP 5-15 Average use of 7 hours 53 minutes Residual AHI 1.0  Assessment:   Moderate obstructive sleep apnea, adequately treated with CPAP therapy  Continues to benefit from CPAP therapy  Cough and congestion has significantly improved  No longer using Symbicort   Plan/Recommendations:  Continue CPAP  on a nightly basis  Continue auto CPAP 5-15 with heated humidification  You do not have to continue using Symbicort   Continue using nasal sprays for nasal stuffiness and congestion  Follow-up a year from now  Call us  with significant concerns   Myer Artis MD [facility] 09/24/2023, 8:31 AM  CC: Roslyn Coombe, MD

## 2023-09-25 NOTE — Progress Notes (Signed)
 EPIC Encounter for ICM Monitoring  Patient Name: [name] is a 78 y.o. male Date: 09/25/2023 Primary Care Physican: Roslyn Coombe, MD Primary Cardiologist: Arlester Ladd Electrophysiologist: Lawana Pray Bi-V Pacing:   97.0%   07/14/2023 Office Weight: 187 lbs   Time in AT/AF  <0.1 hr/day (<0.1%) Battery Longevity: 3 months                                                          Transmission results reviewed.    Diet:  Does eat restaurant foods.     Optivol thoracic impedance suggesting normal fluid levels within the last month.   Prescribed: Spironolactone  25 mg take 0.5 tablet daily.   Labs: 06/10/2023 Creatinine 0.89, BUN 23, Potassium 3.8, Sodium 139, GFR 81.65 A complete set of results can be found in Results Review.   Recommendations:  No changes.   Follow-up plan: ICM clinic phone appointment on 11/03/2023.   91 day device clinic remote transmission 10/30/2023.       EP/Cardiology Office Visits:  01/07/2024 with Dr Arlester Ladd.       Copy of ICM check sent to Dr. Lawana Pray.     3 month ICM trend: 09/22/2023.    12-14 Month ICM trend:     Almyra Jain, RN 09/25/2023 11:09 AM

## 2023-09-29 ENCOUNTER — Ambulatory Visit (INDEPENDENT_AMBULATORY_CARE_PROVIDER_SITE_OTHER)

## 2023-09-29 DIAGNOSIS — I5022 Chronic systolic (congestive) heart failure: Secondary | ICD-10-CM

## 2023-09-30 LAB — CUP PACEART REMOTE DEVICE CHECK
Battery Remaining Longevity: 3 mo
Battery Voltage: 2.78 V
Brady Statistic AP VP Percent: 1.24 %
Brady Statistic AP VS Percent: 0.03 %
Brady Statistic AS VP Percent: 97.17 %
Brady Statistic AS VS Percent: 1.56 %
Brady Statistic RA Percent Paced: 1.26 %
Brady Statistic RV Percent Paced: 49.23 %
Date Time Interrogation Session: 20250512044225
HighPow Impedance: 63 Ohm
Implantable Lead Connection Status: 753985
Implantable Lead Connection Status: 753985
Implantable Lead Connection Status: 753985
Implantable Lead Implant Date: 20181108
Implantable Lead Implant Date: 20181108
Implantable Lead Implant Date: 20181108
Implantable Lead Location: 753858
Implantable Lead Location: 753859
Implantable Lead Location: 753860
Implantable Lead Model: 4298
Implantable Lead Model: 5076
Implantable Pulse Generator Implant Date: 20181108
Lead Channel Impedance Value: 1064 Ohm
Lead Channel Impedance Value: 1140 Ohm
Lead Channel Impedance Value: 1197 Ohm
Lead Channel Impedance Value: 188.1 Ohm
Lead Channel Impedance Value: 208.174
Lead Channel Impedance Value: 241.082
Lead Channel Impedance Value: 276.523
Lead Channel Impedance Value: 322.197
Lead Channel Impedance Value: 342 Ohm
Lead Channel Impedance Value: 399 Ohm
Lead Channel Impedance Value: 418 Ohm
Lead Channel Impedance Value: 532 Ohm
Lead Channel Impedance Value: 532 Ohm
Lead Channel Impedance Value: 608 Ohm
Lead Channel Impedance Value: 608 Ohm
Lead Channel Impedance Value: 646 Ohm
Lead Channel Impedance Value: 760 Ohm
Lead Channel Impedance Value: 817 Ohm
Lead Channel Pacing Threshold Amplitude: 0.5 V
Lead Channel Pacing Threshold Amplitude: 0.625 V
Lead Channel Pacing Threshold Amplitude: 3 V
Lead Channel Pacing Threshold Pulse Width: 0.4 ms
Lead Channel Pacing Threshold Pulse Width: 0.4 ms
Lead Channel Pacing Threshold Pulse Width: 1 ms
Lead Channel Sensing Intrinsic Amplitude: 13.25 mV
Lead Channel Sensing Intrinsic Amplitude: 13.25 mV
Lead Channel Sensing Intrinsic Amplitude: 2.5 mV
Lead Channel Sensing Intrinsic Amplitude: 2.5 mV
Lead Channel Setting Pacing Amplitude: 2 V
Lead Channel Setting Pacing Amplitude: 2.5 V
Lead Channel Setting Pacing Amplitude: 3 V
Lead Channel Setting Pacing Pulse Width: 0.4 ms
Lead Channel Setting Pacing Pulse Width: 1 ms
Lead Channel Setting Sensing Sensitivity: 0.3 mV
Zone Setting Status: 755011
Zone Setting Status: 755011

## 2023-10-01 ENCOUNTER — Telehealth: Payer: Self-pay

## 2023-10-01 ENCOUNTER — Ambulatory Visit: Payer: Self-pay | Admitting: Cardiology

## 2023-10-01 NOTE — Telephone Encounter (Signed)
 Alert received from [employer] for LV Threshold stable at 3.00 V @ 1.00 ms, programmed monitor only with output of 3.00 V @ 1.00 ms, no safety margin programmed.  Device clinic apt made 10/02/23 @ 11:20 am. Location, date and time reviewed with pt w/ understanding.

## 2023-10-02 ENCOUNTER — Ambulatory Visit: Attending: Cardiovascular Disease

## 2023-10-02 ENCOUNTER — Telehealth: Payer: Self-pay

## 2023-10-02 DIAGNOSIS — I428 Other cardiomyopathies: Secondary | ICD-10-CM

## 2023-10-02 NOTE — Telephone Encounter (Signed)
 Patient brought in acutely today due to alert transmission received for no safety margin on LV lead.  Pacing threshold tested 3.0 @ 1.88ms (Monitor) with remote transmission, LV lead is programmed 3.0 @ 1.65ms.     LV lead has been trending between 2.5 and 3.0V@1 .00ms for over 1 year.  Testing today confirmed LV 2.5V at 1.6ms which is stable per trend.  [employer] performed today and confirmed patient is programmed at best output.  No stim.  He is 3 months to ERI.  Will flag for Dr. Lawana Pray to review; otherwise, no changes made today.  Patient is doing well and BIVP 96.5% effectively.     LV lead trend. Sensing and Impedance normal.

## 2023-10-02 NOTE — Progress Notes (Signed)
 Patient brought in acutely today due to alert transmission received for no safety margin on LV lead.  Pacing threshold tested 3.0 @ 1.88ms (Monitor) with remote transmission, LV lead is programmed 3.0 @ 1.65ms.     LV lead has been trending between 2.5 and 3.0V@1 .00ms for over 1 year.  Testing today confirmed LV 2.5V at 1.6ms which is stable per trend.  [employer] performed today and confirmed patient is programmed at best output.  No stim.  He is 3 months to ERI.  Will flag for Dr. Lawana Pray to review; otherwise, no changes made today.  Patient is doing well and BIVP 96.5% effectively.     LV lead trend. Sensing and Impedance normal.

## 2023-10-08 NOTE — Telephone Encounter (Signed)
 Yes, I did.  Per my note below:  ( documented this in PACEART as well):  "[employer] performed today and confirmed patient is programmed at best output. No stim. "

## 2023-10-09 ENCOUNTER — Other Ambulatory Visit: Payer: Self-pay | Admitting: *Deleted

## 2023-10-09 MED ORDER — TRAMADOL HCL 50 MG PO TABS: 50.0000 mg | ORAL_TABLET | Freq: Every day | ORAL | 0 refills | Status: DC | PRN

## 2023-10-09 NOTE — Telephone Encounter (Signed)
 Refill request received via fax from [facility] for Tramadol    Last Fill: 09/02/2023   UDS:07/01/2023 UDS is consistent with tramadol  use.   Narc Agreement: 07/02/2023   Next Visit: 01/07/2024   Last Visit: 07/01/2023   Dx: Seropositive rheumatoid arthritis    Current Dose per office note on 07/01/2023:tramadol  50 mg p.o. p.o. nightly as needed pain      Okay to refill Tramadol ?

## 2023-10-22 NOTE — Progress Notes (Signed)
 Remote ICD transmission.

## 2023-10-22 NOTE — Addendum Note (Signed)
 Addended by: Edra Govern D on: 10/22/2023 09:30 AM   Modules accepted: Orders, Level of Service

## 2023-10-30 ENCOUNTER — Ambulatory Visit (INDEPENDENT_AMBULATORY_CARE_PROVIDER_SITE_OTHER): Payer: Medicare Other

## 2023-10-30 DIAGNOSIS — I428 Other cardiomyopathies: Secondary | ICD-10-CM | POA: Diagnosis not present

## 2023-10-30 LAB — CUP PACEART REMOTE DEVICE CHECK
Battery Remaining Longevity: 2 mo
Battery Voltage: 2.76 V
Brady Statistic AP VP Percent: 2.48 %
Brady Statistic AP VS Percent: 0.04 %
Brady Statistic AS VP Percent: 94.46 %
Brady Statistic AS VS Percent: 3.03 %
Brady Statistic RA Percent Paced: 2.49 %
Brady Statistic RV Percent Paced: 37.76 %
Date Time Interrogation Session: 20250612043723
HighPow Impedance: 64 Ohm
Implantable Lead Connection Status: 753985
Implantable Lead Connection Status: 753985
Implantable Lead Connection Status: 753985
Implantable Lead Implant Date: 20181108
Implantable Lead Implant Date: 20181108
Implantable Lead Implant Date: 20181108
Implantable Lead Location: 753858
Implantable Lead Location: 753859
Implantable Lead Location: 753860
Implantable Lead Model: 4298
Implantable Lead Model: 5076
Implantable Pulse Generator Implant Date: 20181108
Lead Channel Impedance Value: 1121 Ohm
Lead Channel Impedance Value: 1178 Ohm
Lead Channel Impedance Value: 1292 Ohm
Lead Channel Impedance Value: 188.1 Ohm
Lead Channel Impedance Value: 205.2 Ohm
Lead Channel Impedance Value: 245.813
Lead Channel Impedance Value: 282.765
Lead Channel Impedance Value: 323.26 Ohm
Lead Channel Impedance Value: 342 Ohm
Lead Channel Impedance Value: 361 Ohm
Lead Channel Impedance Value: 418 Ohm
Lead Channel Impedance Value: 513 Ohm
Lead Channel Impedance Value: 513 Ohm
Lead Channel Impedance Value: 589 Ohm
Lead Channel Impedance Value: 646 Ohm
Lead Channel Impedance Value: 646 Ohm
Lead Channel Impedance Value: 760 Ohm
Lead Channel Impedance Value: 874 Ohm
Lead Channel Pacing Threshold Amplitude: 0.5 V
Lead Channel Pacing Threshold Amplitude: 0.625 V
Lead Channel Pacing Threshold Amplitude: 3.25 V
Lead Channel Pacing Threshold Pulse Width: 0.4 ms
Lead Channel Pacing Threshold Pulse Width: 0.4 ms
Lead Channel Pacing Threshold Pulse Width: 1 ms
Lead Channel Sensing Intrinsic Amplitude: 10.875 mV
Lead Channel Sensing Intrinsic Amplitude: 10.875 mV
Lead Channel Sensing Intrinsic Amplitude: 2.625 mV
Lead Channel Sensing Intrinsic Amplitude: 2.625 mV
Lead Channel Setting Pacing Amplitude: 2 V
Lead Channel Setting Pacing Amplitude: 2.5 V
Lead Channel Setting Pacing Amplitude: 3 V
Lead Channel Setting Pacing Pulse Width: 0.4 ms
Lead Channel Setting Pacing Pulse Width: 1 ms
Lead Channel Setting Sensing Sensitivity: 0.3 mV
Zone Setting Status: 755011
Zone Setting Status: 755011

## 2023-11-03 ENCOUNTER — Ambulatory Visit: Attending: Cardiology

## 2023-11-03 DIAGNOSIS — I5022 Chronic systolic (congestive) heart failure: Secondary | ICD-10-CM | POA: Diagnosis not present

## 2023-11-03 DIAGNOSIS — Z95810 Presence of automatic (implantable) cardiac defibrillator: Secondary | ICD-10-CM | POA: Diagnosis not present

## 2023-11-04 ENCOUNTER — Ambulatory Visit: Payer: Self-pay | Admitting: Cardiology

## 2023-11-04 NOTE — Progress Notes (Signed)
 EPIC Encounter for ICM Monitoring  Patient Name: [name] is a 78 y.o. male Date: 11/04/2023 Primary Care Physican: Roslyn Coombe, MD Primary Cardiologist: Arlester Ladd Electrophysiologist: Lawana Pray Bi-V Pacing:   95.7%   07/14/2023 Office Weight: 187 lbs 09/24/2023 Office Weight: 187 lbs   Time in AT/AF  0.0 hr/day (0.0%) Battery Longevity: 2 months                                                          Transmission results reviewed.    Diet:  Does eat restaurant foods.     Optivol thoracic impedance suggesting normal fluid levels within the last month.   Prescribed: Spironolactone  25 mg take 0.5 tablet daily.   Labs: 06/10/2023 Creatinine 0.89, BUN 23, Potassium 3.8, Sodium 139, GFR 81.65 A complete set of results can be found in Results Review.   Recommendations:  No changes.   Follow-up plan: ICM clinic phone appointment on 12/22/2023.   91 day device clinic remote transmission 01/29/2024.       EP/Cardiology Office Visits:  12/03/2023 with Mertha Abrahams, PA.   01/07/2024 with Dr Arlester Ladd.       Copy of ICM check sent to Dr. Lawana Pray.      3 month ICM trend: 10/30/2023.    12-14 Month ICM trend:     Almyra Jain, RN 11/04/2023 7:48 AM

## 2023-11-05 ENCOUNTER — Other Ambulatory Visit: Payer: Self-pay | Admitting: Rheumatology

## 2023-11-05 DIAGNOSIS — M1990 Unspecified osteoarthritis, unspecified site: Secondary | ICD-10-CM

## 2023-11-06 ENCOUNTER — Ambulatory Visit
Admission: RE | Admit: 2023-11-06 | Discharge: 2023-11-06 | Disposition: A | Discharge status: Home / Self Care | Source: Ambulatory Visit | Attending: Emergency Medicine | Admitting: Emergency Medicine

## 2023-11-06 VITALS — BP 131/67 | HR 86 | Temp 98.7°F | Resp 15

## 2023-11-06 DIAGNOSIS — L02411 Cutaneous abscess of right axilla: Secondary | ICD-10-CM

## 2023-11-06 MED ORDER — DOXYCYCLINE HYCLATE 100 MG PO CAPS: 100.0000 mg | ORAL_CAPSULE | Freq: Two times a day (BID) | ORAL | 0 refills | Status: DC

## 2023-11-06 NOTE — ED Provider Notes (Signed)
 [facility]    CSN: [id number] Arrival date & time: 11/06/23  1543      History   Chief Complaint Chief Complaint  Patient presents with   Rash    Knot, red sore in armpit possible cyst getting large everyday. Leaving town for a week in morning - Entered by patient    HPI [name] is a 78 y.o. male.    Patient presents for evaluation of a knot in the right armpit present for 2 weeks.  Has become painful and increased in size.  Has attempted use of hydrocortisone  which has been ineffective.  Denies fever or drainage.  Had similar event to the right groin many years ago.    Past Medical History:  Diagnosis Date   AICD (automatic cardioverter/defibrillator) present    Allergic rhinitis    Allergy    Anginal pain (HCC) 05/26/14   chest pain after chasing dog   Atypical nevus of back 04/27/2003   moderate - mid lower back   Basal cell carcinoma 01/26/2008   right cheek - MOHs   CAD (coronary artery disease)    hx of stent- 2005 RCA   Cataract    beginning stage both eyes   CHF (congestive heart failure) (HCC)    pacemaker [employer]     Chronic back pain    intermittent   Constipation    Dizziness    Dysrhythmia    right bundle branch block    Esophageal stricture    GERD (gastroesophageal reflux disease)    Hemoptysis    Hiatal hernia    History of colonic polyps    hyperplastic   HTN (hypertension)    Hyperlipidemia    Hypertrophy of prostate with urinary obstruction and other lower urinary tract symptoms (LUTS)    OA (osteoarthritis)    OSA (obstructive sleep apnea)    cpap- 10    Other specified disorder of stomach and duodenum    duodenal periampulary tubulovillous adenoma removed by Dr. Howard Macho 5/10   Pericarditis 07/06/2019   Pneumonia    Pre-diabetes    no medications   Shortness of breath    with exertion   Sleep apnea    cpap   Testicular hypofunction     Patient Active Problem List   Diagnosis Date Noted   Vitamin D  deficiency  06/13/2023   PSA elevation 06/13/2023   Chronic sinusitis 06/12/2023   Cough 06/10/2023   Adenomatous duodenal polyp 03/13/2023   COVID 09/18/2022   Rupture of tendon of biceps, long head 07/11/2022   OSA (obstructive sleep apnea) 05/30/2022   Rhinorrhea 05/30/2022   Travel advice encounter 05/30/2022   Inflammation of sacroiliac joint (HCC) 05/29/2022   Encounter for well adult exam with abnormal findings 05/29/2022   Pain in joint of left knee 12/13/2021   Osteoarthritis of left knee 12/10/2021   Left hip pain 09/30/2021   Chronic anticoagulation 08/31/2021   History of ERCP 08/31/2021   History of biliary stent insertion 08/31/2021   Pancreatic cyst 08/31/2021   Trochanteric bursitis of left hip 06/23/2021   GI bleed 03/23/2021   Melena    Acute upper GI bleed 03/22/2021   AF (paroxysmal atrial fibrillation) (HCC) 03/22/2021   Acquired hallux rigidus of left foot 02/14/2021   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   Abnormal findings on esophagogastroduodenoscopy (EGD) 02/03/2021   Polyp of duodenum 02/03/2021   Axillary abscess 06/26/2020   Chronic diarrhea 06/26/2020   Fever 01/18/2020   Pericarditis 07/06/2019  Right hip pain 07/06/2019   Acute chest pain 01/24/2019   Prediabetes 12/24/2018   Rheumatoid arthritis (HCC) 12/24/2018   Preventative health care 06/25/2018   Incarcerated ventral hernia 03/20/2018   DCM (dilated cardiomyopathy) (HCC) 02/11/2017   Chronic systolic heart failure (HCC) 01/15/2017   History of pneumonia 10/23/2016   OA (osteoarthritis) of knee 10/23/2016   Primary osteoarthritis of both feet 10/04/2016   Primary osteoarthritis of left knee 10/04/2016   DDD (degenerative disc disease), lumbar 10/04/2016   Hemoptysis 05/27/2016   Overweight (BMI 25.0-29.9) 03/14/2016   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   Degeneration of intervertebral disc of cervical spine without prolapsed disc 10/05/2015   Parotid mass 08/23/2015   Dyspnea 11/29/2014   Synovial cyst of  lumbar facet joint 09/26/2014   Incisional hernia, without obstruction or gangrene 12/06/2013   Nausea alone 08/27/2013   Colon cancer screening 08/27/2013   Spinal stenosis 04/23/2013   Displacement of lumbar intervertebral disc without myelopathy 12/01/2012   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   Benign prostatic hyperplasia with urinary obstruction 06/18/2010   Other specified disorder of stomach and duodenum 12/16/2008   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 11/15/2008   Gastroesophageal reflux disease 09/02/2008   COLONIC POLYPS, HYPERPLASTIC 05/24/2007   OSA on CPAP 05/24/2007   ALLERGIC RHINITIS 05/24/2007   ESOPHAGEAL STRICTURE 05/24/2007   HIATAL HERNIA 05/24/2007   Hyperlipidemia 02/06/2007   Essential hypertension 02/06/2007   Coronary artery disease involving native coronary artery of native heart without angina pectoris 02/06/2007   Primary osteoarthritis of both hands 02/06/2007   Neck pain on right side 02/06/2007    Past Surgical History:  Procedure Laterality Date   BILIARY STENT PLACEMENT N/A 03/19/2021   Procedure: BILIARY STENT PLACEMENT;  Surgeon: Normie Becton., MD;  Location: [facility];  Service: Gastroenterology;  Laterality: N/A;   BIV ICD INSERTION CRT-D N/A 03/27/2017   Procedure: BIV ICD INSERTION CRT-D;  Surgeon: Lei Pump, MD;  Location: [facility] INVASIVE CV LAB;  Service: Cardiovascular;  Laterality: N/A;   CARDIAC CATHETERIZATION     '05, last 2009, showing patent RCA stent   COLONOSCOPY  12/2007   HYPERPLASTIC POLYP   COLONOSCOPY  12/2019   COLONOSCOPY WITH PROPOFOL  N/A 06/02/2014   Procedure: COLONOSCOPY WITH PROPOFOL ;  Surgeon: Janel Medford, MD;  Location: [facility];  Service: Endoscopy;  Laterality: N/A;   CORONARY ANGIOPLASTY  08/2003   ENDOSCOPIC MUCOSAL RESECTION  03/19/2021   Procedure: ENDOSCOPIC MUCOSAL RESECTION, ampullectomy;  Surgeon: Normie Becton., MD;  Location: [facility];  Service:  Gastroenterology;;   ENDOSCOPIC RETROGRADE CHOLANGIOPANCREATOGRAPHY (ERCP) WITH PROPOFOL  N/A 03/19/2021   Procedure: ENDOSCOPIC RETROGRADE CHOLANGIOPANCREATOGRAPHY (ERCP) WITH PROPOFOL ;  Surgeon: Normie Becton., MD;  Location: [facility];  Service: Gastroenterology;  Laterality: N/A;   ENDOSCOPIC RETROGRADE CHOLANGIOPANCREATOGRAPHY (ERCP) WITH PROPOFOL  N/A 11/29/2021   Procedure: ENDOSCOPIC RETROGRADE CHOLANGIOPANCREATOGRAPHY (ERCP) WITH PROPOFOL ;  Surgeon: Brice Campi Albino Alu., MD;  Location: [facility] ENDOSCOPY;  Service: Gastroenterology;  Laterality: N/A;   ESOPHAGOGASTRODUODENOSCOPY (EGD) WITH PROPOFOL  N/A 06/02/2014   Procedure: ESOPHAGOGASTRODUODENOSCOPY (EGD) WITH PROPOFOL ;  Surgeon: Janel Medford, MD;  Location: [facility];  Service: Endoscopy;  Laterality: N/A;   ESOPHAGOGASTRODUODENOSCOPY (EGD) WITH PROPOFOL  N/A 03/19/2021   Procedure: ESOPHAGOGASTRODUODENOSCOPY (EGD) WITH PROPOFOL ;  Surgeon: Brice Campi Albino Alu., MD;  Location: [facility];  Service: Gastroenterology;  Laterality: N/A;   ESOPHAGOGASTRODUODENOSCOPY (EGD) WITH PROPOFOL  N/A 03/23/2021   Procedure: ESOPHAGOGASTRODUODENOSCOPY (EGD) WITH PROPOFOL ;  Surgeon: Asencion Blacksmith, MD;  Location: [facility];  Service: Endoscopy;  Laterality: N/A;   ESOPHAGOGASTRODUODENOSCOPY (EGD) WITH PROPOFOL  N/A 03/13/2023   Procedure: ESOPHAGOGASTRODUODENOSCOPY (EGD) WITH PROPOFOL ;  Surgeon: Brice Campi Albino Alu., MD;  Location: [facility];  Service: Gastroenterology;  Laterality: N/A;  WITH SIDE SCOPE   hernia surgery x 3     Bilateral Inguinal, Umbicial   INSERTION OF MESH N/A 03/20/2018   Procedure: INSERTION OF MESH;  Surgeon: Juanita Norlander, MD;  Location: [facility] OR;  Service: General;  Laterality: N/A;   IRRIGATION AND DEBRIDEMENT ABSCESS Left 08/14/2012   Procedure: IRRIGATION AND DEBRIDEMENT LEFT INGUINAL BOIL ;  Surgeon: Edmund Gouge, MD;  Location: [facility] ORS;  Service: Urology;  Laterality: Left;   JOINT REPLACEMENT  Right 2017   KNEE ARTHROPLASTY     LEFT HEART CATH AND CORONARY ANGIOGRAPHY N/A 01/24/2019   Procedure: LEFT HEART CATH AND CORONARY ANGIOGRAPHY;  Surgeon: Swaziland, Peter M, MD;  Location: [facility] INVASIVE CV LAB;  Service: Cardiovascular;  Laterality: N/A;   LEFT HEART CATHETERIZATION WITH CORONARY ANGIOGRAM N/A 05/26/2014   Procedure: LEFT HEART CATHETERIZATION WITH CORONARY ANGIOGRAM;  Surgeon: Arlander Bellman, MD;  Location: [facility] CATH LAB;  Service: Cardiovascular;  Laterality: N/A;   LUMBAR LAMINECTOMY/DECOMPRESSION MICRODISCECTOMY Left 09/26/2014   Procedure: Lumbar Laminectomy for resection of synovial cyst  Lumbar five- sacral one left;  Surgeon: Gearl Keens, MD;  Location: [facility]URO ORS;  Service: Neurosurgery;  Laterality: Left;   MOLE REMOVAL Left 03/20/2018   Procedure: MOLE REMOVAL;  Surgeon: Juanita Norlander, MD;  Location: [facility] OR;  Service: General;  Laterality: Left;   Oral surg to removed growth from ?sinus  10/2010   ?Dermoid removed by DrRiggs   PACEMAKER INSERTION  04/03/2017   PANCREATIC STENT PLACEMENT  03/19/2021   Procedure: PANCREATIC STENT PLACEMENT;  Surgeon: Normie Becton., MD;  Location: [facility];  Service: Gastroenterology;;   POLYPECTOMY     RCA stenting     '05 RCA   REMOVAL OF STONES  11/29/2021   Procedure: REMOVAL OF SLUDGE;  Surgeon: Brice Campi Albino Alu., MD;  Location: [facility] ENDOSCOPY;  Service: Gastroenterology;;   right toe surgery  Right    Cyst    RIGHT/LEFT HEART CATH AND CORONARY ANGIOGRAPHY N/A 01/15/2017   Procedure: RIGHT/LEFT HEART CATH AND CORONARY ANGIOGRAPHY;  Surgeon: Arnoldo Lapping, MD;  Location: [facility] INVASIVE CV LAB;  Service: Cardiovascular;  Laterality: N/A;   s/p right knee arthroscopy  2005   SPHINCTEROTOMY  03/19/2021   Procedure: SPHINCTEROTOMY;  Surgeon: Mansouraty, Albino Alu., MD;  Location: [facility];  Service: Gastroenterology;;   Yuvonne Herald REMOVAL  11/29/2021   Procedure: STENT REMOVAL;  Surgeon: Normie Becton., MD;  Location:  [facility] ENDOSCOPY;  Service: Gastroenterology;;   SUBMUCOSAL LIFTING INJECTION  03/19/2021   Procedure: SUBMUCOSAL LIFTING INJECTION;  Surgeon: Normie Becton., MD;  Location: [facility];  Service: Gastroenterology;;   thumb surgery Right    TOTAL KNEE ARTHROPLASTY Right 03/12/2016   Procedure: RIGHT TOTAL KNEE ARTHROPLASTY;  Surgeon: Claiborne Crew, MD;  Location: [facility];  Service: Orthopedics;  Laterality: Right;   TOTAL KNEE ARTHROPLASTY Left 12/10/2021   Procedure: TOTAL KNEE ARTHROPLASTY;  Surgeon: Liliane Rei, MD;  Location: [facility];  Service: Orthopedics;  Laterality: Left;   UPPER ESOPHAGEAL ENDOSCOPIC ULTRASOUND (EUS) N/A 03/19/2021   Procedure: UPPER ESOPHAGEAL ENDOSCOPIC ULTRASOUND (EUS);  Surgeon: Normie Becton., MD;  Location: [facility];  Service: Gastroenterology;  Laterality: N/A;   UPPER GASTROINTESTINAL ENDOSCOPY     VENTRAL HERNIA REPAIR N/A 03/20/2018   Procedure: LAPAROSCOPIC VENTRAL INCISIONAL  HERNIA  ERAS PATHWAY;  Surgeon: Juanita Norlander, MD;  Location: [facility] OR;  Service: General;  Laterality: N/A;       Home Medications    Prior to Admission medications   Medication Sig Start Date End Date Taking? Authorizing Provider  acetaminophen  (TYLENOL ) 650 MG CR tablet Take 650 mg by mouth in the morning and at bedtime.    [provider]  albuterol  (VENTOLIN  HFA) 108 (90 Base) MCG/ACT inhaler Inhale 2 puffs into the lungs every 6 (six) hours as needed for wheezing or shortness of breath. Patient not taking: Reported on 09/24/2023 06/04/23   Farris Hong, PA-C  amoxicillin -clavulanate (AUGMENTIN ) 875-125 MG tablet Take 1 tablet by mouth 2 (two) times daily. 07/14/23   Margaretann Sharper, MD  atorvastatin  (LIPITOR) 20 MG tablet TAKE 1 TABLET BY MOUTH DAILY 04/29/23   Arnoldo Lapping, MD  azelastine  (ASTELIN ) 0.1 % nasal spray Place 2 sprays into both nostrils 2 (two) times daily as needed for rhinitis. Use in each nostril as directed 08/04/23 09/03/23   Evelina Hippo, MD  azithromycin  (ZITHROMAX  Z-PAK) 250 MG tablet Take 2 tablets day 1 and then 1 daily for 4 days Patient not taking: Reported on 09/24/2023 07/28/23   Margaretann Sharper, MD  b complex vitamins tablet Take 1 tablet by mouth daily with lunch.    [provider]  benzonatate  (TESSALON ) 200 MG capsule Take 1 capsule (200 mg total) by mouth 3 (three) times daily as needed for cough. Patient not taking: Reported on 09/24/2023 07/28/23   Margaretann Sharper, MD  betamethasone dipropionate 0.05 % cream  11/21/21   [provider]  budesonide -formoterol  (SYMBICORT ) 160-4.5 MCG/ACT inhaler Inhale 2 puffs into the lungs every 12 (twelve) hours. Patient not taking: Reported on 09/24/2023 07/14/23   Margaretann Sharper, MD  carvedilol  (COREG ) 25 MG tablet TAKE 1 TABLET BY MOUTH TWICE DAILY 12/09/22   Arnoldo Lapping, MD  cholecalciferol (VITAMIN D3) 25 MCG (1000 UNIT) tablet Take 1,000 Units by mouth daily.    [provider]  Coenzyme Q10 (CO Q 10 PO) Take 200 mg by mouth daily with lunch.    [provider]  ELIQUIS  5 MG TABS tablet TAKE 1 TABLET(5 MG) BY MOUTH TWICE DAILY 06/02/23   Cooper, Michael, MD  Flaxseed, Linseed, (FLAXSEED OIL PO) Take 1 capsule by mouth daily. With Omega 3    [provider]  fluocinonide (LIDEX) 0.05 % external solution as needed.    [provider]  folic acid  (FOLVITE ) 400 MCG tablet Take 400 mcg by mouth daily.    [provider]  Glucosamine HCl (GLUCOSAMINE PO) Take 1,500 mg by mouth every evening.    [provider]  hydrocortisone  2.5 % cream Apply 1 application topically 2 (two) times daily as needed (rash on nose).  01/14/19   [provider]  hydroxychloroquine  (PLAQUENIL ) 200 MG tablet TAKE 1 TABLET BY MOUTH EVERY DAY ON MONDAY TO FRIDAY ONLY. DO NOT TAKE ON SATURDAY OR SUNDAY. 09/10/23   Nicholas Bari, MD  ipratropium (ATROVENT ) 0.06 % nasal spray Place 2 sprays into both nostrils 2  (two) times daily as needed for rhinitis. Patient not taking: Reported on 09/24/2023 08/04/23   Evelina Hippo, MD  Lutein  20 MG TABS Take 20 mg by mouth daily.    [provider]  Multiple Vitamin (MULTIVITAMIN) capsule Take 1 capsule by mouth daily.      [provider]  nitroGLYCERIN  (NITROSTAT ) 0.4 MG SL tablet Dissolve 1  tablet under the tongue every 5 minutes as needed for chest pain. Max of 3 doses, then 911. 12/09/22   Arnoldo Lapping, MD  NON FORMULARY CPAP machine with sleep.    [provider]  omeprazole  (PRILOSEC) 40 MG capsule TAKE 1 CAPSULE(40 MG) BY MOUTH IN THE MORNING AND AT BEDTIME 08/04/23 08/18/23  Milon Aloe B, MD  pantoprazole  (PROTONIX ) 40 MG tablet Take 1 tablet (40 mg total) by mouth daily. before breakfast Patient not taking: Reported on 09/24/2023 07/15/23   Roslyn Coombe, MD  polyethylene glycol (MIRALAX  / GLYCOLAX ) 17 g packet Take 17 g by mouth daily as needed for mild constipation. Patient not taking: Reported on 09/24/2023 03/24/21   Barbee Lew, MD  predniSONE  (DELTASONE ) 20 MG tablet Take 1 tablet (20 mg total) by mouth daily with breakfast. Patient not taking: Reported on 09/24/2023 07/28/23   Margaretann Sharper, MD  Probiotic Product (PROBIOTIC DAILY PO) Take 1 capsule by mouth daily with lunch.     [provider]  RESTASIS  0.05 % ophthalmic emulsion Place 1 drop into both eyes 2 (two) times daily as needed (dry eyes). 01/12/21   [provider]  sacubitril -valsartan  (ENTRESTO ) 49-51 MG Take 1 tablet by mouth 2 (two) times daily. 12/09/22   Arnoldo Lapping, MD  Saw Palmetto, Serenoa repens, (SAW PALMETTO PO) Take 450 mg by mouth every evening.    [provider]  spironolactone  (ALDACTONE ) 25 MG tablet Take 1 tablet (25 mg total) by mouth daily. 04/29/23   Arnoldo Lapping, MD  tamsulosin  (FLOMAX ) 0.4 MG CAPS capsule Take 0.4 mg by mouth daily.    [provider]  Testosterone  20 % CREA Apply 2 mLs  topically daily. Rub on shoulder    [provider]  tobramycin-dexamethasone  (TOBRADEX) ophthalmic ointment as needed.    [provider]  traMADol  (ULTRAM ) 50 MG tablet Take 1 tablet (50 mg total) by mouth daily as needed. 10/09/23   Nicholas Bari, MD  TURMERIC PO Take 1 tablet by mouth daily with lunch.     [provider]  vitamin C (ASCORBIC ACID) 500 MG tablet Take 500 mg by mouth daily.    [provider]  zinc gluconate 50 MG tablet Take 50 mg by mouth daily.    [provider]    Family History Family History  Problem Relation Age of Onset   Heart disease Mother    Coronary artery disease Father    Diabetes Father    Sudden death Father        due to heart disease   Heart disease Father    COPD Brother    Arthritis Brother    Heart failure Brother    Cancer Brother        lung    Stomach cancer Paternal Grandmother    Arthritis Daughter    Hypertension Other    Colon cancer Neg Hx    Esophageal cancer Neg Hx    Colon polyps Neg Hx    Ulcerative colitis Neg Hx    Liver disease Neg Hx    Pancreatic cancer Neg Hx    Inflammatory bowel disease Neg Hx    Rectal cancer Neg Hx     Social History Social History   Tobacco Use   Smoking status: Former    Current packs/day: 0.00    Types: Cigarettes    Quit date: 05/20/1990    Years since quitting: 33.4    Passive exposure: Never   Smokeless  tobacco: Never   Tobacco comments:    previous 30 pack year history  Vaping Use   Vaping status: Never Used  Substance Use Topics   Alcohol  use: Yes    Comment: rare   Drug use: Never     Allergies   Sulfonamide derivatives   Review of Systems Review of Systems  Skin:  Positive for rash.     Physical Exam Triage Vital Signs ED Triage Vitals  Encounter Vitals Group     BP 11/06/23 1600 131/67     Girls Systolic BP Percentile --      Girls Diastolic BP Percentile --      Boys Systolic BP Percentile --      Boys  Diastolic BP Percentile --      Pulse Rate 11/06/23 1600 86     Resp 11/06/23 1600 15     Temp 11/06/23 1600 98.7 F (37.1 C)     Temp Source 11/06/23 1600 Oral     SpO2 --      Weight --      Height --      Head Circumference --      Peak Flow --      Pain Score 11/06/23 1558 5     Pain Loc --      Pain Education --      Exclude from Growth Chart --    No data found.  Updated Vital Signs BP 131/67 (BP Location: Right Arm)   Pulse 86   Temp 98.7 F (37.1 C) (Oral)   Resp 15   Visual Acuity Right Eye Distance:   Left Eye Distance:   Bilateral Distance:    Right Eye Near:   Left Eye Near:    Bilateral Near:     Physical Exam Constitutional:      Appearance: Normal appearance.   Eyes:     Extraocular Movements: Extraocular movements intact.   Pulmonary:     Effort: Pulmonary effort is normal.   Skin:    Comments: 1 x 2 erythematous immature abscess present to the right axilla at approximately 5-6 o'clock   Neurological:     Mental Status: He is alert and oriented to person, place, and time.      UC Treatments / Results  Labs (all labs ordered are listed, but only abnormal results are displayed) Labs Reviewed - No data to display  EKG   Radiology No results found.  Procedures Procedures (including critical care time)  Medications Ordered in UC Medications - No data to display  Initial Impression / Assessment and Plan / UC Course  I have reviewed the triage vital signs and the nursing notes.  Pertinent labs & imaging results that were available during my care of the patient were reviewed by me and considered in my medical decision making (see chart for details).   Abscess of right axilla  Presentation consistent with a cyst, due to firmness deferring I&D, discussed this with patient, prescribed doxycycline  recommended warm compresses and over-the-counter analgesics for supportive care, advised follow-up for nonhealing nondraining site Final  Clinical Impressions(s) / UC Diagnoses   Final diagnoses:  None   Discharge Instructions   None    ED Prescriptions   None    PDMP not reviewed this encounter.   Reena Canning, [place] 11/06/23 [phone]

## 2023-11-06 NOTE — ED Triage Notes (Signed)
 Pt present with an abscess on the rt underarm. Pt states it is very sore and red. Pt states it is like a knot, has had it for two weeks. C/o it getting bigger.   Home interventions: Hydrocortisone 

## 2023-11-06 NOTE — Discharge Instructions (Addendum)
 Area of concern is a cyst, possible skin cyst versus ingrown hair  Take doxycycline  twice daily for 7 days  Hold warm-hot compresses to affected area at least 4 times a day, this helps to facilitate draining, the more the better  Please return for evaluation for increased swelling, increased tenderness or pain, non healing site, non draining site, you begin to have fever or chills   We reviewed the etiology of recurrent abscesses of skin.  Skin abscesses are collections of pus within the dermis and deeper skin tissues. Skin abscesses manifest as painful, tender, fluctuant, and erythematous nodules, frequently surmounted by a pustule and surrounded by a rim of erythematous swelling.  Spontaneous drainage of purulent material may occur.  Fever can occur on occasion.    -Skin abscesses can develop in healthy individuals with no predisposing conditions other than skin or nasal carriage of Staphylococcus aureus.  Individuals in close contact with others who have active infection with skin abscesses are at increased risk which is likely to explain why twin brother has similar episodes.   In addition, any process leading to a breach in the skin barrier can also predispose to the development of a skin abscesses, such as atopic dermatitis.

## 2023-11-12 ENCOUNTER — Other Ambulatory Visit: Payer: Self-pay | Admitting: Rheumatology

## 2023-11-12 NOTE — Telephone Encounter (Signed)
 Last Fill: 10/09/2023  UDS:07/01/2023 UDS is consistent with tramadol  use.   Narc Agreement: 07/01/2023  Next Visit: 01/07/2024  Last Visit: 07/01/2023  Dx: Seropositive rheumatoid arthritis   Current Dose per office note on 07/01/2023:tramadol  50 mg p.o. p.o. nightly as needed pain    Okay to refill Tramadol ?

## 2023-11-13 ENCOUNTER — Ambulatory Visit
Admission: RE | Admit: 2023-11-13 | Discharge: 2023-11-13 | Disposition: A | Discharge status: Home / Self Care | Source: Ambulatory Visit | Attending: Family Medicine | Admitting: Family Medicine

## 2023-11-13 VITALS — BP 119/70 | HR 81 | Temp 97.9°F | Resp 18

## 2023-11-13 DIAGNOSIS — R2231 Localized swelling, mass and lump, right upper limb: Secondary | ICD-10-CM | POA: Diagnosis not present

## 2023-11-13 MED ORDER — DOXYCYCLINE HYCLATE 100 MG PO CAPS
100.0000 mg | ORAL_CAPSULE | Freq: Two times a day (BID) | ORAL | 0 refills | Status: DC
Start: 2023-11-13 — End: 2023-12-03

## 2023-11-13 NOTE — ED Triage Notes (Signed)
 Pt reports abscess in the left axilla x 2 weeks. States he was prescribed meds and red compress x 1 week and is not gone away

## 2023-11-13 NOTE — ED Provider Notes (Signed)
 [facility]  Note:  This document was prepared using Conservation officer, historic buildings and may include unintentional dictation errors.  MRN: [id number] DOB: 01-Mar-1946  Subjective:   [name] is a 78 y.o. male presenting for recheck on a persistent abscess over the right armpit.  Patient was seen 1 week ago and started on doxycycline  for suspected abscess.  Incision and drainage was performed.  Patient reports that there has been dramatic improvement including a decrease in the pain, redness and swelling.  The area has not drained at all.  He has used warm compresses.  No fevers.  He does have a dermatologist and follows up with them closely.  Has had multiple biopsies in the past over different parts of his skin that have generally resulted in precancerous skin cells.  No current facility-administered medications for this encounter.  Current Outpatient Medications:    acetaminophen  (TYLENOL ) 650 MG CR tablet, Take 650 mg by mouth in the morning and at bedtime., Disp: , Rfl:    albuterol  (VENTOLIN  HFA) 108 (90 Base) MCG/ACT inhaler, Inhale 2 puffs into the lungs every 6 (six) hours as needed for wheezing or shortness of breath. (Patient not taking: Reported on 09/24/2023), Disp: 8 g, Rfl: 0   amoxicillin -clavulanate (AUGMENTIN ) 875-125 MG tablet, Take 1 tablet by mouth 2 (two) times daily., Disp: 20 tablet, Rfl: 0   atorvastatin  (LIPITOR) 20 MG tablet, TAKE 1 TABLET BY MOUTH DAILY, Disp: 90 tablet, Rfl: 2   azelastine  (ASTELIN ) 0.1 % nasal spray, Place 2 sprays into both nostrils 2 (two) times daily as needed for rhinitis. Use in each nostril as directed, Disp: 30 mL, Rfl: 12   azithromycin  (ZITHROMAX  Z-PAK) 250 MG tablet, Take 2 tablets day 1 and then 1 daily for 4 days (Patient not taking: Reported on 09/24/2023), Disp: 6 each, Rfl: 0   b complex vitamins tablet, Take 1 tablet by mouth daily with lunch., Disp: , Rfl:    benzonatate  (TESSALON ) 200 MG capsule, Take 1  capsule (200 mg total) by mouth 3 (three) times daily as needed for cough. (Patient not taking: Reported on 09/24/2023), Disp: 90 capsule, Rfl: 0   betamethasone dipropionate 0.05 % cream, , Disp: , Rfl:    budesonide -formoterol  (SYMBICORT ) 160-4.5 MCG/ACT inhaler, Inhale 2 puffs into the lungs every 12 (twelve) hours. (Patient not taking: Reported on 09/24/2023), Disp: 10.2 g, Rfl: 3   carvedilol  (COREG ) 25 MG tablet, TAKE 1 TABLET BY MOUTH TWICE DAILY, Disp: 180 tablet, Rfl: 3   cholecalciferol (VITAMIN D3) 25 MCG (1000 UNIT) tablet, Take 1,000 Units by mouth daily., Disp: , Rfl:    Coenzyme Q10 (CO Q 10 PO), Take 200 mg by mouth daily with lunch., Disp: , Rfl:    doxycycline  (VIBRAMYCIN ) 100 MG capsule, Take 1 capsule (100 mg total) by mouth 2 (two) times daily., Disp: 14 capsule, Rfl: 0   ELIQUIS  5 MG TABS tablet, TAKE 1 TABLET(5 MG) BY MOUTH TWICE DAILY, Disp: 180 tablet, Rfl: 1   Flaxseed, Linseed, (FLAXSEED OIL PO), Take 1 capsule by mouth daily. With Omega 3, Disp: , Rfl:    fluocinonide (LIDEX) 0.05 % external solution, as needed., Disp: , Rfl:    folic acid  (FOLVITE ) 400 MCG tablet, Take 400 mcg by mouth daily., Disp: , Rfl:    Glucosamine HCl (GLUCOSAMINE PO), Take 1,500 mg by mouth every evening., Disp: , Rfl:    hydrocortisone  2.5 % cream, Apply 1 application topically 2 (two) times daily as needed (  rash on nose). , Disp: , Rfl:    hydroxychloroquine  (PLAQUENIL ) 200 MG tablet, TAKE 1 TABLET BY MOUTH EVERY DAY ON MONDAY TO FRIDAY ONLY. DO NOT TAKE ON SATURDAY OR SUNDAY., Disp: 60 tablet, Rfl: 0   ipratropium (ATROVENT ) 0.06 % nasal spray, Place 2 sprays into both nostrils 2 (two) times daily as needed for rhinitis. (Patient not taking: Reported on 09/24/2023), Disp: 15 mL, Rfl: 12   Lutein  20 MG TABS, Take 20 mg by mouth daily., Disp: , Rfl:    Multiple Vitamin (MULTIVITAMIN) capsule, Take 1 capsule by mouth daily.  , Disp: , Rfl:    nitroGLYCERIN  (NITROSTAT ) 0.4 MG SL tablet, Dissolve 1  tablet under the tongue every 5 minutes as needed for chest pain. Max of 3 doses, then 911., Disp: 25 tablet, Rfl: 6   NON FORMULARY, CPAP machine with sleep., Disp: , Rfl:    omeprazole  (PRILOSEC) 40 MG capsule, TAKE 1 CAPSULE(40 MG) BY MOUTH IN THE MORNING AND AT BEDTIME, Disp: 28 capsule, Rfl: 0   pantoprazole  (PROTONIX ) 40 MG tablet, Take 1 tablet (40 mg total) by mouth daily. before breakfast (Patient not taking: Reported on 09/24/2023), Disp: 90 tablet, Rfl: 3   polyethylene glycol (MIRALAX  / GLYCOLAX ) 17 g packet, Take 17 g by mouth daily as needed for mild constipation. (Patient not taking: Reported on 09/24/2023), Disp: 14 each, Rfl: 0   predniSONE  (DELTASONE ) 20 MG tablet, Take 1 tablet (20 mg total) by mouth daily with breakfast. (Patient not taking: Reported on 09/24/2023), Disp: 7 tablet, Rfl: 0   Probiotic Product (PROBIOTIC DAILY PO), Take 1 capsule by mouth daily with lunch. , Disp: , Rfl:    RESTASIS  0.05 % ophthalmic emulsion, Place 1 drop into both eyes 2 (two) times daily as needed (dry eyes)., Disp: , Rfl:    sacubitril -valsartan  (ENTRESTO ) 49-51 MG, Take 1 tablet by mouth 2 (two) times daily., Disp: 180 tablet, Rfl: 3   Saw Palmetto, Serenoa repens, (SAW PALMETTO PO), Take 450 mg by mouth every evening., Disp: , Rfl:    spironolactone  (ALDACTONE ) 25 MG tablet, Take 1 tablet (25 mg total) by mouth daily., Disp: 45 tablet, Rfl: 1   tamsulosin  (FLOMAX ) 0.4 MG CAPS capsule, Take 0.4 mg by mouth daily., Disp: , Rfl:    Testosterone  20 % CREA, Apply 2 mLs topically daily. Rub on shoulder, Disp: , Rfl:    tobramycin-dexamethasone  (TOBRADEX) ophthalmic ointment, as needed., Disp: , Rfl:    traMADol  (ULTRAM ) 50 MG tablet, Take 1 tablet (50 mg total) by mouth at bedtime as needed., Disp: 30 tablet, Rfl: 0   TURMERIC PO, Take 1 tablet by mouth daily with lunch. , Disp: , Rfl:    vitamin C (ASCORBIC ACID) 500 MG tablet, Take 500 mg by mouth daily., Disp: , Rfl:    zinc gluconate 50 MG tablet,  Take 50 mg by mouth daily., Disp: , Rfl:    Allergies  Allergen Reactions   Sulfonamide Derivatives Rash         Past Medical History:  Diagnosis Date   AICD (automatic cardioverter/defibrillator) present    Allergic rhinitis    Allergy    Anginal pain (HCC) 05/26/14   chest pain after chasing dog   Atypical nevus of back 04/27/2003   moderate - mid lower back   Basal cell carcinoma 01/26/2008   right cheek - MOHs   CAD (coronary artery disease)    hx of stent- 2005 RCA   Cataract    beginning stage  both eyes   CHF (congestive heart failure) (HCC)    pacemaker Medtronic     Chronic back pain    intermittent   Constipation    Dizziness    Dysrhythmia    right bundle branch block    Esophageal stricture    GERD (gastroesophageal reflux disease)    Hemoptysis    Hiatal hernia    History of colonic polyps    hyperplastic   HTN (hypertension)    Hyperlipidemia    Hypertrophy of prostate with urinary obstruction and other lower urinary tract symptoms (LUTS)    OA (osteoarthritis)    OSA (obstructive sleep apnea)    cpap- 10    Other specified disorder of stomach and duodenum    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   Pericarditis 07/06/2019   Pneumonia    Pre-diabetes    no medications   Shortness of breath    with exertion   Sleep apnea    cpap   Testicular hypofunction      Past Surgical History:  Procedure Laterality Date   BILIARY STENT PLACEMENT N/A 03/19/2021   Procedure: BILIARY STENT PLACEMENT;  Surgeon: Wilhelmenia Aloha Raddle., MD;  Location: [facility];  Service: Gastroenterology;  Laterality: N/A;   BIV ICD INSERTION CRT-D N/A 03/27/2017   Procedure: BIV ICD INSERTION CRT-D;  Surgeon: Inocencio Soyla Lunger, MD;  Location: [facility] INVASIVE CV LAB;  Service: Cardiovascular;  Laterality: N/A;   CARDIAC CATHETERIZATION     '05, last 2009, showing patent RCA stent   COLONOSCOPY  12/2007   HYPERPLASTIC POLYP   COLONOSCOPY   12/2019   COLONOSCOPY WITH PROPOFOL  N/A 06/02/2014   Procedure: COLONOSCOPY WITH PROPOFOL ;  Surgeon: Toribio SHAUNNA Teressa, MD;  Location: [facility];  Service: Endoscopy;  Laterality: N/A;   CORONARY ANGIOPLASTY  08/2003   ENDOSCOPIC MUCOSAL RESECTION  03/19/2021   Procedure: ENDOSCOPIC MUCOSAL RESECTION, ampullectomy;  Surgeon: Wilhelmenia Aloha Raddle., MD;  Location: [facility];  Service: Gastroenterology;;   ENDOSCOPIC RETROGRADE CHOLANGIOPANCREATOGRAPHY (ERCP) WITH PROPOFOL  N/A 03/19/2021   Procedure: ENDOSCOPIC RETROGRADE CHOLANGIOPANCREATOGRAPHY (ERCP) WITH PROPOFOL ;  Surgeon: Wilhelmenia Aloha Raddle., MD;  Location: [facility];  Service: Gastroenterology;  Laterality: N/A;   ENDOSCOPIC RETROGRADE CHOLANGIOPANCREATOGRAPHY (ERCP) WITH PROPOFOL  N/A 11/29/2021   Procedure: ENDOSCOPIC RETROGRADE CHOLANGIOPANCREATOGRAPHY (ERCP) WITH PROPOFOL ;  Surgeon: Wilhelmenia Aloha Raddle., MD;  Location: [facility];  Service: Gastroenterology;  Laterality: N/A;   ESOPHAGOGASTRODUODENOSCOPY (EGD) WITH PROPOFOL  N/A 06/02/2014   Procedure: ESOPHAGOGASTRODUODENOSCOPY (EGD) WITH PROPOFOL ;  Surgeon: Toribio SHAUNNA Teressa, MD;  Location: [facility];  Service: Endoscopy;  Laterality: N/A;   ESOPHAGOGASTRODUODENOSCOPY (EGD) WITH PROPOFOL  N/A 03/19/2021   Procedure: ESOPHAGOGASTRODUODENOSCOPY (EGD) WITH PROPOFOL ;  Surgeon: Wilhelmenia Aloha Raddle., MD;  Location: [facility];  Service: Gastroenterology;  Laterality: N/A;   ESOPHAGOGASTRODUODENOSCOPY (EGD) WITH PROPOFOL  N/A 03/23/2021   Procedure: ESOPHAGOGASTRODUODENOSCOPY (EGD) WITH PROPOFOL ;  Surgeon: Aneita Gwendlyn DASEN, MD;  Location: [facility];  Service: Endoscopy;  Laterality: N/A;   ESOPHAGOGASTRODUODENOSCOPY (EGD) WITH PROPOFOL  N/A 03/13/2023   Procedure: ESOPHAGOGASTRODUODENOSCOPY (EGD) WITH PROPOFOL ;  Surgeon: Wilhelmenia Aloha Raddle., MD;  Location: [facility];  Service: Gastroenterology;  Laterality: N/A;  WITH SIDE SCOPE   hernia surgery x 3     Bilateral  Inguinal, Umbicial   INSERTION OF MESH N/A 03/20/2018   Procedure: INSERTION OF MESH;  Surgeon: Ethyl Lenis, MD;  Location: [facility] OR;  Service: General;  Laterality: N/A;   IRRIGATION AND DEBRIDEMENT ABSCESS Left 08/14/2012   Procedure: IRRIGATION AND DEBRIDEMENT LEFT INGUINAL BOIL ;  Surgeon: Arlena LILLETTE Gal, MD;  Location: [facility];  Service: Urology;  Laterality: Left;   JOINT REPLACEMENT Right 2017   KNEE ARTHROPLASTY     LEFT HEART CATH AND CORONARY ANGIOGRAPHY N/A 01/24/2019   Procedure: LEFT HEART CATH AND CORONARY ANGIOGRAPHY;  Surgeon: Swaziland, Peter M, MD;  Location: [facility] INVASIVE CV LAB;  Service: Cardiovascular;  Laterality: N/A;   LEFT HEART CATHETERIZATION WITH CORONARY ANGIOGRAM N/A 05/26/2014   Procedure: LEFT HEART CATHETERIZATION WITH CORONARY ANGIOGRAM;  Surgeon: Ozell JONETTA Fell, MD;  Location: [facility] CATH LAB;  Service: Cardiovascular;  Laterality: N/A;   LUMBAR LAMINECTOMY/DECOMPRESSION MICRODISCECTOMY Left 09/26/2014   Procedure: Lumbar Laminectomy for resection of synovial cyst  Lumbar five- sacral one left;  Surgeon: Arley Helling, MD;  Location: [facility];  Service: Neurosurgery;  Laterality: Left;   MOLE REMOVAL Left 03/20/2018   Procedure: MOLE REMOVAL;  Surgeon: Ethyl Lenis, MD;  Location: [facility] OR;  Service: General;  Laterality: Left;   Oral surg to removed growth from ?sinus  10/2010   ?Dermoid removed by DrRiggs   PACEMAKER INSERTION  04/03/2017   PANCREATIC STENT PLACEMENT  03/19/2021   Procedure: PANCREATIC STENT PLACEMENT;  Surgeon: Wilhelmenia Aloha Raddle., MD;  Location: [facility];  Service: Gastroenterology;;   POLYPECTOMY     RCA stenting     '05 RCA   REMOVAL OF STONES  11/29/2021   Procedure: REMOVAL OF SLUDGE;  Surgeon: Wilhelmenia Aloha Raddle., MD;  Location: [facility] ENDOSCOPY;  Service: Gastroenterology;;   right toe surgery  Right    Cyst    RIGHT/LEFT HEART CATH AND CORONARY ANGIOGRAPHY N/A 01/15/2017   Procedure: RIGHT/LEFT HEART CATH AND CORONARY ANGIOGRAPHY;   Surgeon: Fell Ozell, MD;  Location: [facility] INVASIVE CV LAB;  Service: Cardiovascular;  Laterality: N/A;   s/p right knee arthroscopy  2005   SPHINCTEROTOMY  03/19/2021   Procedure: SPHINCTEROTOMY;  Surgeon: Mansouraty, Aloha Raddle., MD;  Location: [facility];  Service: Gastroenterology;;   CLEDA REMOVAL  11/29/2021   Procedure: STENT REMOVAL;  Surgeon: Wilhelmenia Aloha Raddle., MD;  Location: [facility] ENDOSCOPY;  Service: Gastroenterology;;   SUBMUCOSAL LIFTING INJECTION  03/19/2021   Procedure: SUBMUCOSAL LIFTING INJECTION;  Surgeon: Wilhelmenia Aloha Raddle., MD;  Location: [facility];  Service: Gastroenterology;;   thumb surgery Right    TOTAL KNEE ARTHROPLASTY Right 03/12/2016   Procedure: RIGHT TOTAL KNEE ARTHROPLASTY;  Surgeon: Donnice Car, MD;  Location: [facility];  Service: Orthopedics;  Laterality: Right;   TOTAL KNEE ARTHROPLASTY Left 12/10/2021   Procedure: TOTAL KNEE ARTHROPLASTY;  Surgeon: Melodi Lerner, MD;  Location: [facility];  Service: Orthopedics;  Laterality: Left;   UPPER ESOPHAGEAL ENDOSCOPIC ULTRASOUND (EUS) N/A 03/19/2021   Procedure: UPPER ESOPHAGEAL ENDOSCOPIC ULTRASOUND (EUS);  Surgeon: Wilhelmenia Aloha Raddle., MD;  Location: [facility];  Service: Gastroenterology;  Laterality: N/A;   UPPER GASTROINTESTINAL ENDOSCOPY     VENTRAL HERNIA REPAIR N/A 03/20/2018   Procedure: LAPAROSCOPIC VENTRAL INCISIONAL  HERNIA ERAS PATHWAY;  Surgeon: Ethyl Lenis, MD;  Location: [facility] OR;  Service: General;  Laterality: N/A;    Family History  Problem Relation Age of Onset   Heart disease Mother    Coronary artery disease Father    Diabetes Father    Sudden death Father        due to heart disease   Heart disease Father    COPD Brother    Arthritis Brother    Heart failure Brother    Cancer Brother        lung  Stomach cancer Paternal Grandmother    Arthritis Daughter    Hypertension Other    Colon cancer Neg Hx    Esophageal cancer Neg Hx    Colon polyps Neg Hx    Ulcerative  colitis Neg Hx    Liver disease Neg Hx    Pancreatic cancer Neg Hx    Inflammatory bowel disease Neg Hx    Rectal cancer Neg Hx     Social History   Tobacco Use   Smoking status: Former    Current packs/day: 0.00    Types: Cigarettes    Quit date: 05/20/1990    Years since quitting: 33.5    Passive exposure: Never   Smokeless tobacco: Never   Tobacco comments:    previous 30 pack year history  Vaping Use   Vaping status: Never Used  Substance Use Topics   Alcohol  use: Yes    Comment: rare   Drug use: Never    ROS   Objective:   Vitals: BP 119/70 (BP Location: Right Arm)   Pulse 81   Temp 97.9 F (36.6 C) (Oral)   Resp 18   SpO2 92%   Physical Exam Constitutional:      General: He is not in acute distress.    Appearance: Normal appearance. He is well-developed and normal weight. He is not ill-appearing, toxic-appearing or diaphoretic.  HENT:     Head: Normocephalic and atraumatic.     Right Ear: External ear normal.     Left Ear: External ear normal.     Nose: Nose normal.     Mouth/Throat:     Pharynx: Oropharynx is clear.   Eyes:     General: No scleral icterus.       Right eye: No discharge.        Left eye: No discharge.     Extraocular Movements: Extraocular movements intact.    Cardiovascular:     Rate and Rhythm: Normal rate.  Pulmonary:     Effort: Pulmonary effort is normal.   Musculoskeletal:     Cervical back: Normal range of motion.   Skin:      Neurological:     Mental Status: He is alert and oriented to person, place, and time.   Psychiatric:        Mood and Affect: Mood normal.        Behavior: Behavior normal.        Thought Content: Thought content normal.        Judgment: Judgment normal.     Assessment and Plan :   PDMP not reviewed this encounter.  1. Mass of right axilla    Offered procedural intervention, possible excision of an undifferentiated mass.  Patient declined.  I am in agreement.  He plans on following  up with his dermatologist as soon as possible.  In the meantime, he did achieve near resolution with doxycycline  and will extend his course for another 7 days.  Continue warm compresses.  Counseled patient on potential for adverse effects with medications prescribed/recommended today, ER and return-to-clinic precautions discussed, patient verbalized understanding.    Christopher Savannah, [place] 11/13/23 [phone]

## 2023-11-13 NOTE — Discharge Instructions (Signed)
 Please follow up with your dermatologist for a consultation regarding a firm mobile skin mass of your right arm pit. You have done well with doxycycline  and therefore, will extend the course for 7 days to continue helping with secondary infection.

## 2023-11-17 NOTE — Progress Notes (Addendum)
 Remote ICD transmission.

## 2023-11-17 NOTE — Addendum Note (Signed)
 Addended by: TAWNI DRILLING D on: 11/17/2023 01:28 PM   Modules accepted: Orders, Level of Service

## 2023-11-23 ENCOUNTER — Telehealth: Payer: Self-pay | Admitting: Cardiology

## 2023-11-23 NOTE — Telephone Encounter (Signed)
 RRT was reached 10/24/23.   Has upcoming apt with [place], PA 12/03/23.   Need to call pt see if he would like to come in before to turn ICD alarm off.  Routing to [employer] as FYI.

## 2023-11-23 NOTE — Telephone Encounter (Signed)
   Patient called after hours number today after hearing an isolated alarm sound from his defibrillator. Denies any symptoms this morning, feels well. I personally reviewed patient's 10/30/23 interrogation which showed battery at ~2 months. Given this and lack of symptoms, suspect his device has reached ERI. Will request device clinic follow up to verify on 7/7.  Artist Pouch, PA-C

## 2023-11-24 ENCOUNTER — Telehealth: Payer: Self-pay | Admitting: Cardiology

## 2023-11-24 NOTE — Telephone Encounter (Signed)
  1. Has your device fired? no  2. Is you device beeping? Yes. Patient states it went off twice once yesterday and then again today.  He called yesterday and spoke to the PA on call.   3. Are you experiencing draining or swelling at device site? no  4. Are you calling to see if we received your device transmission? no  5. Have you passed out? no   Please route to Device Clinic Pool

## 2023-11-24 NOTE — Telephone Encounter (Signed)
 Call received from Pt.  Advised his device reached ERI November 23, 2023.  Advised Pt has 3 months left on battery.  He is ok to leave alarm on until follow up scheduled.  Await further needs.

## 2023-11-24 NOTE — Telephone Encounter (Signed)
 Duplicate phone note.

## 2023-11-28 ENCOUNTER — Other Ambulatory Visit: Payer: Self-pay | Admitting: Rheumatology

## 2023-11-28 ENCOUNTER — Other Ambulatory Visit: Payer: Self-pay | Admitting: Cardiovascular Disease

## 2023-11-28 DIAGNOSIS — M1990 Unspecified osteoarthritis, unspecified site: Secondary | ICD-10-CM

## 2023-11-28 NOTE — Telephone Encounter (Signed)
 Last Fill: 09/10/2023  Eye exam: 10/08/2022 WNL   Contacted [facility] patient had one 10/09/2022 they will fax over  Labs: 06/10/2023 WBC 11.3 Monocytes 1.2 BMP and Hepatic function WNL  Contacted patient to update labs, patient will be by Monday   Next Visit: 01/07/2024  Last Visit: 07/01/2023  DX: Seropositive rheumatoid arthritis   Current Dose per office note 07/01/2023: Plaquenil  200 mg p.o. daily Monday to Friday   Okay to refill Plaquenil ?

## 2023-12-01 ENCOUNTER — Encounter

## 2023-12-01 ENCOUNTER — Other Ambulatory Visit: Payer: Self-pay | Admitting: *Deleted

## 2023-12-01 DIAGNOSIS — Z79899 Other long term (current) drug therapy: Secondary | ICD-10-CM

## 2023-12-01 LAB — CUP PACEART REMOTE DEVICE CHECK
Battery Remaining Longevity: 1 mo
Battery Voltage: 2.72 V
Brady Statistic AP VP Percent: 1.17 %
Brady Statistic AP VS Percent: 0.02 %
Brady Statistic AS VP Percent: 97.42 %
Brady Statistic AS VS Percent: 1.39 %
Brady Statistic RA Percent Paced: 1.18 %
Brady Statistic RV Percent Paced: 51.87 %
Date Time Interrogation Session: 20250714044225
HighPow Impedance: 68 Ohm
Implantable Lead Connection Status: 753985
Implantable Lead Connection Status: 753985
Implantable Lead Connection Status: 753985
Implantable Lead Implant Date: 20181108
Implantable Lead Implant Date: 20181108
Implantable Lead Implant Date: 20181108
Implantable Lead Location: 753858
Implantable Lead Location: 753859
Implantable Lead Location: 753860
Implantable Lead Model: 4298
Implantable Lead Model: 5076
Implantable Pulse Generator Implant Date: 20181108
Lead Channel Impedance Value: 1083 Ohm
Lead Channel Impedance Value: 1121 Ohm
Lead Channel Impedance Value: 1178 Ohm
Lead Channel Impedance Value: 193.707
Lead Channel Impedance Value: 211.891
Lead Channel Impedance Value: 244.746
Lead Channel Impedance Value: 269.677
Lead Channel Impedance Value: 306.269
Lead Channel Impedance Value: 361 Ohm
Lead Channel Impedance Value: 399 Ohm
Lead Channel Impedance Value: 418 Ohm
Lead Channel Impedance Value: 513 Ohm
Lead Channel Impedance Value: 513 Ohm
Lead Channel Impedance Value: 589 Ohm
Lead Channel Impedance Value: 608 Ohm
Lead Channel Impedance Value: 703 Ohm
Lead Channel Impedance Value: 760 Ohm
Lead Channel Impedance Value: 760 Ohm
Lead Channel Pacing Threshold Amplitude: 0.5 V
Lead Channel Pacing Threshold Amplitude: 0.5 V
Lead Channel Pacing Threshold Amplitude: 3 V
Lead Channel Pacing Threshold Pulse Width: 0.4 ms
Lead Channel Pacing Threshold Pulse Width: 0.4 ms
Lead Channel Pacing Threshold Pulse Width: 1 ms
Lead Channel Sensing Intrinsic Amplitude: 2.625 mV
Lead Channel Sensing Intrinsic Amplitude: 2.625 mV
Lead Channel Sensing Intrinsic Amplitude: 9.875 mV
Lead Channel Sensing Intrinsic Amplitude: 9.875 mV
Lead Channel Setting Pacing Amplitude: 2 V
Lead Channel Setting Pacing Amplitude: 2.5 V
Lead Channel Setting Pacing Amplitude: 3 V
Lead Channel Setting Pacing Pulse Width: 0.4 ms
Lead Channel Setting Pacing Pulse Width: 1 ms
Lead Channel Setting Sensing Sensitivity: 0.3 mV
Zone Setting Status: 755011
Zone Setting Status: 755011

## 2023-12-01 LAB — CBC WITH DIFFERENTIAL/PLATELET
Absolute Lymphocytes: 1476 {cells}/uL (ref 850–3900)
Absolute Monocytes: 626 {cells}/uL (ref 200–950)
Basophils Absolute: 37 {cells}/uL (ref 0–200)
Basophils Relative: 0.6 %
Eosinophils Absolute: 136 {cells}/uL (ref 15–500)
Eosinophils Relative: 2.2 %
HCT: 43.3 % (ref 38.5–50.0)
Hemoglobin: 14.2 g/dL (ref 13.2–17.1)
MCH: 30.1 pg (ref 27.0–33.0)
MCHC: 32.8 g/dL (ref 32.0–36.0)
MCV: 91.7 fL (ref 80.0–100.0)
MPV: 9.8 fL (ref 7.5–12.5)
Monocytes Relative: 10.1 %
Neutro Abs: 3925 {cells}/uL (ref 1500–7800)
Neutrophils Relative %: 63.3 %
Platelets: 242 Thousand/uL (ref 140–400)
RBC: 4.72 Million/uL (ref 4.20–5.80)
RDW: 12.7 % (ref 11.0–15.0)
Total Lymphocyte: 23.8 %
WBC: 6.2 Thousand/uL (ref 3.8–10.8)

## 2023-12-01 LAB — COMPREHENSIVE METABOLIC PANEL WITH GFR
AG Ratio: 2.3 (calc) (ref 1.0–2.5)
ALT: 19 U/L (ref 9–46)
AST: 20 U/L (ref 10–35)
Albumin: 4.4 g/dL (ref 3.6–5.1)
Alkaline phosphatase (APISO): 66 U/L (ref 35–144)
BUN: 13 mg/dL (ref 7–25)
CO2: 26 mmol/L (ref 20–32)
Calcium: 9.9 mg/dL (ref 8.6–10.3)
Chloride: 106 mmol/L (ref 98–110)
Creat: 0.74 mg/dL (ref 0.70–1.28)
Globulin: 1.9 g/dL (ref 1.9–3.7)
Glucose, Bld: 119 mg/dL — ABNORMAL HIGH (ref 65–99)
Potassium: 4.3 mmol/L (ref 3.5–5.3)
Sodium: 140 mmol/L (ref 135–146)
Total Bilirubin: 1.1 mg/dL (ref 0.2–1.2)
Total Protein: 6.3 g/dL (ref 6.1–8.1)
eGFR: 92 mL/min/1.73m2 (ref >=60)

## 2023-12-02 ENCOUNTER — Ambulatory Visit: Payer: Self-pay | Admitting: Rheumatology

## 2023-12-02 NOTE — Progress Notes (Unsigned)
 Cardiology Office Note:  .   Date:  12/03/2023  ID:  [name], DOB 04-Jan-1946, MRN [id number] PCP: [name] ORN, MD  [facility] Providers Cardiologist:  Ozell Fell, MD Electrophysiologist:  Soyla Gladis Norton, MD {  History of Present Illness: .   [name] is a 78 y.o. male w/PMHx of  CAD (remote PCI to the RCA), NICM, chronic CHF (systolic), LBBB, Afib,  ICD HTN, HLD, OSA (w/CPAP)   He saw Dr. Vana last 07/24/22, doing well, no cardiac symptoms, concerns, no changes were made.  Most recently saw Dr. Fell 06/12/23, doing well, discussed elevated Trigs, discussed lifestyle adjustments  I saw him 08/07/23 He feels well No changes sine his visit with dr. Fell Denies any CP, palpitations or cardiac awareness No SOB, DOE No near syncope or syncope No bleeding or signs of bleeding ~ 4 mo to RRT, lead measurements are stable <0.1% AFib burden, last in Oct 2024 B>AX changed where needed  Planned for 3mo f/u  [facility] visit 11/06/23 for R axillay swelling/cyst progressively painful Presentation consistent with a cyst, due to firmness deferring I&D, discussed this with patient, prescribed doxycycline  recommended warm compresses and over-the-counter analgesics for supportive care,  11/13/23: follow up [facility] visit Patient was seen 1 week ago and started on doxycycline  for suspected abscess. Incision and drainage was performed. Patient reports that there has been dramatic improvement including a decrease in the pain, redness and swelling. The area has not drained at all.  Offered procedural intervention, possible excision of an undifferentiated mass. Patient declined. I am in agreement. He plans on following up with his dermatologist as soon as possible. In the meantime, he did achieve near resolution with doxycycline  and will extend his course for another 7 days.     Today's visit is scheduled as a planned f/u, RRT reached on 11/23/23 ROS:  He feels really  well Is very active walks ~78miles a day  No CP, palpitations or cardiac awareness No SOB, denies DOE No near syncope or syncope No bleeding or signs of bleeding  Remarks how happy he is and thankful to have such quality of life as he approaches 78 y/o  R axilla has healed up Saw derm, they suspected had been an infected hair follicle, he completed the antibiotics and reports it as healed up   He had 3 different antibiotics on his list, none of whic he remains on and says some were really old   Device information MDT CRT-D implanted 03/27/2017 complicated by microperforation tx w/colchicine     Arrhythmia/AAD hx AFib found via device 2020 No AAD to date that I find  Studies Reviewed: SABRA    EKG not done today  DEVICE interrogation done today and reviewed by myself RRT reached on 11/23/23 lead measurements are stable zero % AFib burden No VT Battery depletion alert is turned off   07/02/22: TTE 1. Left ventricular ejection fraction, by estimation, is 50 to 55%. The left ventricle has low normal function. The left ventricle has no regional wall motion abnormalities. There is moderate concentric left ventricular hypertrophy. Left ventricular diastolic parameters are consistent with Grade I diastolic dysfunction (impaired relaxation). 2. Right ventricular systolic function is normal. The right ventricular size is normal. There is normal pulmonary artery systolic pressure. 3. Left atrial size was moderately dilated. 4. The mitral valve is normal in structure. Trivial mitral valve regurgitation. No evidence of mitral stenosis. 5. The aortic valve is tricuspid. Aortic valve regurgitation is trivial. No  aortic stenosis is present. 6. The inferior vena cava is normal in size with greater than 50% respiratory variability, suggesting right atrial pressure of 3 mmHg.  MYOCARDIAL PERFUSION IMAGING 12/12/2022 Narrative   The study shows normal perfusion. The study is intermediate risk due to  mildly reduced EF on study (appears globally hypokinetic and worse in the inferior segments; does not appear to be related to V-pacing). Recommend correlation with echo for better assessment.   No ST deviation was noted.   LV perfusion is normal. There is no evidence of ischemia.   Left ventricular function is abnormal. Nuclear stress EF: 43%. The left ventricular ejection fraction is moderately decreased (30-44%). End diastolic cavity size is normal. End systolic cavity size is normal.   Prior study not available for comparison.     06/22/20: TTE IMPRESSIONS   1. Left ventricular ejection fraction, by estimation, is 50 to 55%. The  left ventricle has low normal function. The left ventricle has no regional  wall motion abnormalities. There is moderate concentric left ventricular  hypertrophy. Left ventricular  diastolic parameters are indeterminate.   2. Right ventricular systolic function is normal. The right ventricular  size is normal. There is normal pulmonary artery systolic pressure.   3. Left atrial size was mildly dilated.   4. The mitral valve is normal in structure. Mild mitral valve  regurgitation. No evidence of mitral stenosis.   5. The aortic valve is tricuspid. There is mild calcification of the  aortic valve. Aortic valve regurgitation is not visualized. No aortic  stenosis is present.   Comparison(s): No significant change from prior study.   CARDIAC CATHETERIZATION 01/24/2019   Narrative  Mid LAD lesion is 30% stenosed.  Non-stenotic Mid RCA lesion was previously treated.  The left ventricular systolic function is normal.  LV end diastolic pressure is normal.  The left ventricular ejection fraction is 55-65% by visual estimate.   1. Nonobstructive CAD. Continued patency of the stent in the RCA 2. Good LV systolic function 3. Normal LVEDP   Plan: will obtain an Echo. Consider a trial of colchicine . Resume Eliquis  tomorrow am.     TTE 06/25/16 Review of the  above records today demonstrates:  - Left ventricle: The cavity size was normal. Systolic function was   normal. The estimated ejection fraction was in the range of 55%   to 60%. Wall motion was normal; there were no regional wall   motion abnormalities. Doppler parameters are consistent with   abnormal left ventricular relaxation (grade 1 diastolic   dysfunction). - Ventricular septum: Septal motion showed dyssynergy. These   changes are consistent with a left bundle branch block. - Aortic valve: There was trivial regurgitation. - Atrial septum: There was increased thickness of the septum,   consistent with lipomatous hypertrophy.   LHC/RHC 01/05/17 1. Widely patent coronary arteries with continued patency of the stented segment in the right coronary artery 2. Mild nonobstructive coronary artery disease as detailed with mild stenosis of the proximal to mid LAD and otherwise minimal luminal irregularities 3. Well compensated right-sided cardiac hemodynamics     12/03/2016 TTE Study Conclusions  - Left ventricle: The cavity size was mildly dilated. There was    mild concentric hypertrophy. Systolic function was moderately to    severely reduced. The estimated ejection fraction was in the    range of 30% to 35%. Wall motion was normal; there were no    regional wall motion abnormalities. Features are consistent with  a pseudonormal left ventricular filling pattern, with concomitant    abnormal relaxation and increased filling pressure (grade 2    diastolic dysfunction). Doppler parameters are consistent with    elevated ventricular end-diastolic filling pressure.  - Aortic valve: There was trivial regurgitation.  - Mitral valve: There was moderate to severe regurgitation. Valve    area by pressure half-time: 1.59 cm^2.  - Left atrium: The atrium was moderately dilated.  - Right ventricle: Systolic function was normal.  - Pulmonic valve: There was mild regurgitation.  - Pulmonary  arteries: Systolic pressure was mildly increased. PA    peak pressure: 36 mm Hg (S).  - Inferior vena cava: The vessel was normal in size. The    respirophasic diameter changes were in the normal range (= 50%),    consistent with normal central venous pressure.   Impressions:  - When compared to the prior study from 03/08/2015 LVEF has    decreased to 30-35% with diffuse hypokinesis and significant    septal-lateral wall dyssynchrony.    Risk Assessment/Calculations:    Physical Exam:   VS:  BP (!) 105/57   Pulse 76   Ht 5' 7 (1.702 m)   Wt 186 lb (84.4 kg)   SpO2 94%   BMI 29.13 kg/m    Wt Readings from Last 3 Encounters:  12/03/23 186 lb (84.4 kg)  09/24/23 187 lb 3.2 oz (84.9 kg)  08/07/23 185 lb (83.9 kg)    GEN: Well nourished, well developed in no acute distress NECK: No JVD; No carotid bruits CARDIAC: RRR, no murmurs, rubs, gallops RESPIRATORY:  CTA b/l without rales, wheezing or rhonchi  ABDOMEN: Soft, non-tender, non-distended EXTREMITIES: No edema; No deformity   R axilla: no evidence of any ongoing infection, no appreciable mass  ICD site: is stable, no thinning, fluctuation, tethering  ASSESSMENT AND PLAN: .    ICD RRT Lead measurements are stable LV lead chronically elevated threshold, appears stable, left with 1/4V margin  We discussed generator change procedure, potential risks/benefits He is agreeable to proceed. Discussed holding Eliquis  2d ahead of his procedure I advised to let us  know if any recurrent infection, new issues arise  paroxysmal AFib CHA2DS2Vasc is 5, on Eliquis , appropriately dosed zerto % burden  NICM Chronic CHF Recovered LVEF No symptoms or exam findings of volume OL OptiVol looks great C/w Dr. Matias  CAD No symptoms C/w Dr. Matias  Secondary hypercoagulable state 2/2 AFib     Dispo: post procedure follow up as usual, sooner if needed  Signed, Charlies Macario Arthur, PA-C

## 2023-12-02 NOTE — Progress Notes (Signed)
CBC and CMP are normal.  Glucose is mildly elevated probably not a fasting sample.

## 2023-12-03 ENCOUNTER — Ambulatory Visit: Attending: Cardiology | Admitting: Physician Assistant

## 2023-12-03 ENCOUNTER — Telehealth: Payer: Self-pay

## 2023-12-03 ENCOUNTER — Ambulatory Visit: Payer: Self-pay | Admitting: Cardiology

## 2023-12-03 VITALS — BP 105/57 | HR 76 | Ht 67.0 in | Wt 186.0 lb

## 2023-12-03 DIAGNOSIS — D6869 Other thrombophilia: Secondary | ICD-10-CM | POA: Diagnosis not present

## 2023-12-03 DIAGNOSIS — I2510 Atherosclerotic heart disease of native coronary artery without angina pectoris: Secondary | ICD-10-CM | POA: Diagnosis not present

## 2023-12-03 DIAGNOSIS — Z4502 Encounter for adjustment and management of automatic implantable cardiac defibrillator: Secondary | ICD-10-CM | POA: Diagnosis not present

## 2023-12-03 DIAGNOSIS — I480 Paroxysmal atrial fibrillation: Secondary | ICD-10-CM

## 2023-12-03 DIAGNOSIS — I428 Other cardiomyopathies: Secondary | ICD-10-CM

## 2023-12-03 LAB — CUP PACEART INCLINIC DEVICE CHECK
Battery Remaining Longevity: 1 mo
Battery Voltage: 2.72 V
Brady Statistic AP VP Percent: 1.7 %
Brady Statistic AP VS Percent: 0.03 %
Brady Statistic AS VP Percent: 96.08 %
Brady Statistic AS VS Percent: 2.19 %
Brady Statistic RA Percent Paced: 1.71 %
Brady Statistic RV Percent Paced: 45.18 %
Date Time Interrogation Session: 20250716163940
HighPow Impedance: 69 Ohm
Implantable Lead Connection Status: 753985
Implantable Lead Connection Status: 753985
Implantable Lead Connection Status: 753985
Implantable Lead Implant Date: 20181108
Implantable Lead Implant Date: 20181108
Implantable Lead Implant Date: 20181108
Implantable Lead Location: 753858
Implantable Lead Location: 753859
Implantable Lead Location: 753860
Implantable Lead Model: 4298
Implantable Lead Model: 5076
Implantable Pulse Generator Implant Date: 20181108
Lead Channel Impedance Value: 1140 Ohm
Lead Channel Impedance Value: 1140 Ohm
Lead Channel Impedance Value: 1254 Ohm
Lead Channel Impedance Value: 212.8 Ohm
Lead Channel Impedance Value: 228 Ohm
Lead Channel Impedance Value: 270.092
Lead Channel Impedance Value: 295.059
Lead Channel Impedance Value: 325.111
Lead Channel Impedance Value: 399 Ohm
Lead Channel Impedance Value: 399 Ohm
Lead Channel Impedance Value: 456 Ohm
Lead Channel Impedance Value: 532 Ohm
Lead Channel Impedance Value: 532 Ohm
Lead Channel Impedance Value: 608 Ohm
Lead Channel Impedance Value: 703 Ohm
Lead Channel Impedance Value: 722 Ohm
Lead Channel Impedance Value: 817 Ohm
Lead Channel Impedance Value: 836 Ohm
Lead Channel Pacing Threshold Amplitude: 0.5 V
Lead Channel Pacing Threshold Amplitude: 0.5 V
Lead Channel Pacing Threshold Amplitude: 3 V
Lead Channel Pacing Threshold Pulse Width: 0.4 ms
Lead Channel Pacing Threshold Pulse Width: 0.4 ms
Lead Channel Pacing Threshold Pulse Width: 1 ms
Lead Channel Sensing Intrinsic Amplitude: 10 mV
Lead Channel Sensing Intrinsic Amplitude: 11 mV
Lead Channel Sensing Intrinsic Amplitude: 2.75 mV
Lead Channel Sensing Intrinsic Amplitude: 2.75 mV
Lead Channel Setting Pacing Amplitude: 2 V
Lead Channel Setting Pacing Amplitude: 2.5 V
Lead Channel Setting Pacing Amplitude: 3 V
Lead Channel Setting Pacing Pulse Width: 0.4 ms
Lead Channel Setting Pacing Pulse Width: 1 ms
Lead Channel Setting Sensing Sensitivity: 0.3 mV
Zone Setting Status: 755011
Zone Setting Status: 755011

## 2023-12-03 NOTE — Addendum Note (Signed)
 Addended by: LEVERNE CHARLIES CROME on: 12/03/2023 04:44 PM   Modules accepted: Orders

## 2023-12-03 NOTE — Telephone Encounter (Signed)
 Pt is scheduled for CRT-D gen change with Dr. Inocencio on 9/3 at 3:30 pm. He will have labs done on 8/20 after appt with Dr. Wonda that day.   I have printed instruction letter to give pt while here at appt with Charlies Arthur, PA today.

## 2023-12-03 NOTE — Patient Instructions (Addendum)
 Medication Instructions:   Your physician recommends that you continue on your current medications as directed. Please refer to the Current Medication list given to you today.  *If you need a refill on your cardiac medications before your next appointment, please call your pharmacy*   Lab Work:   PLEASE GO DOWN STAIRS  [facility]  FIRST FLOOR   ( GET OFF ELEVATORS WALK TOWARDS WAITING AREA LAB LOCATED BY PHARMACY): BMET AND CBC  @ DR COPPER APPOINTMENT    If you have labs (blood work) drawn today and your tests are completely normal, you will receive your results only by: MyChart Message (if you have MyChart) OR A paper copy in the mail If you have any lab test that is abnormal or we need to change your treatment, we will call you to review the results.   Testing/Procedures:    SEE LETTER FOR PROCEDURE    Follow-Up: At [facility], you and your health needs are our priority.  As part of our continuing mission to provide you with exceptional heart care, our providers are all part of one team.  This team includes your primary Cardiologist (physician) and Advanced Practice Providers or APPs (Physician Assistants and Nurse Practitioners) who all work together to provide you with the care you need, when you need it.   Your next appointment:  FOLLOW UP AS SCHEDULED   Provider:   You may see Will Gladis Norton, MD or one of the following Advanced Practice Providers on your designated Care Team:    We recommend signing up for the patient portal called MyChart.  Sign up information is provided on this After Visit Summary.  MyChart is used to connect with patients for Virtual Visits (Telemedicine).  Patients are able to view lab/test results, encounter notes, upcoming appointments, etc.  Non-urgent messages can be sent to your provider as well.   To learn more about what you can do with MyChart, go to [email or web address].   Other Instructions

## 2023-12-03 NOTE — Telephone Encounter (Signed)
 Pt scheduled on 9/3 at 330 pm with Dr. Inocencio for BiV-ICD Gen Change.

## 2023-12-08 ENCOUNTER — Encounter

## 2023-12-17 NOTE — Telephone Encounter (Signed)
 Error

## 2023-12-19 ENCOUNTER — Other Ambulatory Visit: Payer: Self-pay | Admitting: Rheumatology

## 2023-12-19 ENCOUNTER — Other Ambulatory Visit: Payer: Self-pay | Admitting: Physician Assistant

## 2023-12-19 ENCOUNTER — Telehealth: Payer: Self-pay

## 2023-12-19 DIAGNOSIS — M1990 Unspecified osteoarthritis, unspecified site: Secondary | ICD-10-CM

## 2023-12-19 NOTE — Telephone Encounter (Signed)
 Contacted the patient regarding his PLQ eye exam for a Plaquenil  refill. Patient states he had an eye exam within the last three months and it should have been faxed to our office a couple of weeks ago. Advised the patient the chart has not been updated and we will look to see if we have it Monday.

## 2023-12-19 NOTE — Telephone Encounter (Signed)
 Last Fill: 11/13/2023  UDS:07/01/2023 UDS is consistent with tramadol  use.   Narc Agreement: 07/01/2023  Next Visit: 01/07/2024  Last Visit: 07/01/2023  Dx: Seropositive rheumatoid arthritis (HCC)   Current Dose per office note on 07/01/2023: tramadol  50 mg p.o. p.o. nightly as needed pain.   Patient to update UDS and Narcotics Agreement at upcomming appointment.    Okay to refill Tramadol ?

## 2023-12-19 NOTE — Telephone Encounter (Signed)
 Last Fill: 11/28/2023  Eye exam: 10/08/2022 WNL   Labs: 12/01/2023 CBC and CMP are normal. Glucose is mildly elevated probably not a fasting sample.   Next Visit: 01/07/2024  Last Visit: 07/01/2023  IK:Dzmnendpupcz rheumatoid arthritis (HCC)   Current Dose per office note 07/01/2023: Plaquenil  200 mg p.o. daily Monday to Friday   Contacted the patient regarding his PLQ eye exam. Patient states he had an eye exam within the last three months and it should have been faxed to our office a couple of weeks ago. Advised the patient the chart has not been updated and we will look to see if we have it Monday.   Okay to refill Plaquenil ?

## 2023-12-22 ENCOUNTER — Ambulatory Visit: Attending: Cardiology

## 2023-12-22 DIAGNOSIS — I5022 Chronic systolic (congestive) heart failure: Secondary | ICD-10-CM | POA: Diagnosis not present

## 2023-12-22 DIAGNOSIS — Z95810 Presence of automatic (implantable) cardiac defibrillator: Secondary | ICD-10-CM | POA: Diagnosis not present

## 2023-12-22 NOTE — Telephone Encounter (Signed)
 Spoke with [name], eye exam has been received but not specific. Did fax eye exam paper to be filled out and have not received it back yet. Re-faxed PLQ eye exam paper again today.

## 2023-12-22 NOTE — Telephone Encounter (Signed)
 Paperwork placed in the pending folder in [employer].

## 2023-12-22 NOTE — Telephone Encounter (Signed)
 Eye exam has been received but not specific. Did fax eye exam paper to be filled out and have not received it back yet. Re-faxed PLQ eye exam paper again today.

## 2023-12-22 NOTE — Telephone Encounter (Signed)
 Have we received the updated eye exam? If not please call ophthalmologist for records

## 2023-12-24 NOTE — Progress Notes (Signed)
 Remote ICD transmission.

## 2023-12-25 NOTE — Progress Notes (Signed)
 Office Visit Note  Patient: [name]             Date of Birth: 31-Dec-1945           MRN: [id number]             PCP: [name], MD Referring: [name], MD Visit Date: 01/07/2024 Occupation: @GUAROCC @  Subjective:  Medication management   History of Present Illness: HUIE GHUMAN is a 78 y.o. male with seropositive rheumatoid arthritis, osteoarthritis and degenerative disc disease.  He returns today after his last visit in February 2025.  He states he continues to have pain and discomfort in all of his joints including his hands, knees, ankles and his feet.  He continues to have pain and stiffness in his lower back.  He states he was evaluated by [facility] for left trochanteric bursitis.  He has been doing some stretching exercises.    Activities of Daily Living:  Patient reports morning stiffness for 5-10 minutes.   Patient Reports nocturnal pain.  Difficulty dressing/grooming: Denies Difficulty climbing stairs: Denies Difficulty getting out of chair: Reports Difficulty using hands for taps, buttons, cutlery, and/or writing: Reports  Review of Systems  Constitutional:  Positive for fatigue.  HENT:  Negative for mouth sores and mouth dryness.   Eyes:  Positive for dryness.  Respiratory:  Positive for shortness of breath.   Cardiovascular:  Negative for chest pain and palpitations.  Gastrointestinal:  Negative for blood in stool, constipation and diarrhea.  Endocrine: Negative for increased urination.  Genitourinary:  Negative for involuntary urination.  Musculoskeletal:  Positive for joint pain, joint pain, joint swelling, myalgias, muscle weakness, morning stiffness, muscle tenderness and myalgias. Negative for gait problem.  Skin:  Negative for color change, rash, hair loss and sensitivity to sunlight.  Allergic/Immunologic: Negative for susceptible to infections.  Neurological:  Negative for dizziness and headaches.  Hematological:  Negative for swollen  glands.  Psychiatric/Behavioral:  Positive for depressed mood. Negative for sleep disturbance. The patient is not nervous/anxious.     PMFS History:  Patient Active Problem List   Diagnosis Date Noted   Vitamin D  deficiency 06/13/2023   PSA elevation 06/13/2023   Chronic sinusitis 06/12/2023   Cough 06/10/2023   Adenomatous duodenal polyp 03/13/2023   COVID 09/18/2022   Rupture of tendon of biceps, long head 07/11/2022   OSA (obstructive sleep apnea) 05/30/2022   Rhinorrhea 05/30/2022   Travel advice encounter 05/30/2022   Inflammation of sacroiliac joint (HCC) 05/29/2022   Encounter for well adult exam with abnormal findings 05/29/2022   Pain in joint of left knee 12/13/2021   Osteoarthritis of left knee 12/10/2021   Left hip pain 09/30/2021   Chronic anticoagulation 08/31/2021   History of ERCP 08/31/2021   History of biliary stent insertion 08/31/2021   Pancreatic cyst 08/31/2021   Trochanteric bursitis of left hip 06/23/2021   GI bleed 03/23/2021   Melena    Acute upper GI bleed 03/22/2021   AF (paroxysmal atrial fibrillation) (HCC) 03/22/2021   Acquired hallux rigidus of left foot 02/14/2021   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   Abnormal findings on esophagogastroduodenoscopy (EGD) 02/03/2021   Polyp of duodenum 02/03/2021   Axillary abscess 06/26/2020   Chronic diarrhea 06/26/2020   Fever 01/18/2020   Pericarditis 07/06/2019   Right hip pain 07/06/2019   Acute chest pain 01/24/2019   Prediabetes 12/24/2018   Rheumatoid arthritis (HCC) 12/24/2018   Preventative health care 06/25/2018   Incarcerated ventral hernia 03/20/2018  DCM (dilated cardiomyopathy) (HCC) 02/11/2017   Chronic systolic heart failure (HCC) 01/15/2017   History of pneumonia 10/23/2016   OA (osteoarthritis) of knee 10/23/2016   Primary osteoarthritis of both feet 10/04/2016   Primary osteoarthritis of left knee 10/04/2016   DDD (degenerative disc disease), lumbar 10/04/2016   Hemoptysis 05/27/2016    Overweight (BMI 25.0-29.9) 03/14/2016   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   Degeneration of intervertebral disc of cervical spine without prolapsed disc 10/05/2015   Parotid mass 08/23/2015   Dyspnea 11/29/2014   Synovial cyst of lumbar facet joint 09/26/2014   Incisional hernia, without obstruction or gangrene 12/06/2013   Nausea alone 08/27/2013   Colon cancer screening 08/27/2013   Spinal stenosis 04/23/2013   Displacement of lumbar intervertebral disc without myelopathy 12/01/2012   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   Benign prostatic hyperplasia with urinary obstruction 06/18/2010   Other specified disorder of stomach and duodenum 12/16/2008   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 11/15/2008   Gastroesophageal reflux disease 09/02/2008   COLONIC POLYPS, HYPERPLASTIC 05/24/2007   OSA on CPAP 05/24/2007   ALLERGIC RHINITIS 05/24/2007   ESOPHAGEAL STRICTURE 05/24/2007   HIATAL HERNIA 05/24/2007   Hyperlipidemia 02/06/2007   Essential hypertension 02/06/2007   Coronary artery disease involving native coronary artery of native heart without angina pectoris 02/06/2007   Primary osteoarthritis of both hands 02/06/2007   Neck pain on right side 02/06/2007    Past Medical History:  Diagnosis Date   AICD (automatic cardioverter/defibrillator) present    Allergic rhinitis    Allergy    Anginal pain (HCC) 05/26/14   chest pain after chasing dog   Atypical nevus of back 04/27/2003   moderate - mid lower back   Basal cell carcinoma 01/26/2008   right cheek - MOHs   CAD (coronary artery disease)    hx of stent- 2005 RCA   Cataract    beginning stage both eyes   CHF (congestive heart failure) (HCC)    pacemaker [employer]     Chronic back pain    intermittent   Constipation    Dizziness    Dysrhythmia    right bundle branch block    Esophageal stricture    GERD (gastroesophageal reflux disease)    Hemoptysis    Hiatal hernia    History of colonic polyps    hyperplastic    HTN (hypertension)    Hyperlipidemia    Hypertrophy of prostate with urinary obstruction and other lower urinary tract symptoms (LUTS)    OA (osteoarthritis)    OSA (obstructive sleep apnea)    cpap- 10    Other specified disorder of stomach and duodenum    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   Pericarditis 07/06/2019   Pneumonia    Pre-diabetes    no medications   Shortness of breath    with exertion   Sleep apnea    cpap   Testicular hypofunction     Family History  Problem Relation Age of Onset   Heart disease Mother    Coronary artery disease Father    Diabetes Father    Sudden death Father        due to heart disease   Heart disease Father    COPD Brother    Arthritis Brother    Heart failure Brother    Cancer Brother        lung    Stomach cancer Paternal Grandmother    Arthritis Daughter    Hypertension  Other    Colon cancer Neg Hx    Esophageal cancer Neg Hx    Colon polyps Neg Hx    Ulcerative colitis Neg Hx    Liver disease Neg Hx    Pancreatic cancer Neg Hx    Inflammatory bowel disease Neg Hx    Rectal cancer Neg Hx    Past Surgical History:  Procedure Laterality Date   BILIARY STENT PLACEMENT N/A 03/19/2021   Procedure: BILIARY STENT PLACEMENT;  Surgeon: Wilhelmenia Aloha Raddle., MD;  Location: [facility];  Service: Gastroenterology;  Laterality: N/A;   BIV ICD INSERTION CRT-D N/A 03/27/2017   Procedure: BIV ICD INSERTION CRT-D;  Surgeon: Inocencio Soyla Lunger, MD;  Location: [facility] INVASIVE CV LAB;  Service: Cardiovascular;  Laterality: N/A;   CARDIAC CATHETERIZATION     '05, last 2009, showing patent RCA stent   COLONOSCOPY  12/2007   HYPERPLASTIC POLYP   COLONOSCOPY  12/2019   COLONOSCOPY WITH PROPOFOL  N/A 06/02/2014   Procedure: COLONOSCOPY WITH PROPOFOL ;  Surgeon: Toribio SHAUNNA Cedar, MD;  Location: [facility];  Service: Endoscopy;  Laterality: N/A;   CORONARY ANGIOPLASTY  08/2003   ENDOSCOPIC MUCOSAL RESECTION   03/19/2021   Procedure: ENDOSCOPIC MUCOSAL RESECTION, ampullectomy;  Surgeon: Wilhelmenia Aloha Raddle., MD;  Location: [facility];  Service: Gastroenterology;;   ENDOSCOPIC RETROGRADE CHOLANGIOPANCREATOGRAPHY (ERCP) WITH PROPOFOL  N/A 03/19/2021   Procedure: ENDOSCOPIC RETROGRADE CHOLANGIOPANCREATOGRAPHY (ERCP) WITH PROPOFOL ;  Surgeon: Wilhelmenia Aloha Raddle., MD;  Location: [facility];  Service: Gastroenterology;  Laterality: N/A;   ENDOSCOPIC RETROGRADE CHOLANGIOPANCREATOGRAPHY (ERCP) WITH PROPOFOL  N/A 11/29/2021   Procedure: ENDOSCOPIC RETROGRADE CHOLANGIOPANCREATOGRAPHY (ERCP) WITH PROPOFOL ;  Surgeon: Wilhelmenia Aloha Raddle., MD;  Location: [facility] ENDOSCOPY;  Service: Gastroenterology;  Laterality: N/A;   ESOPHAGOGASTRODUODENOSCOPY (EGD) WITH PROPOFOL  N/A 06/02/2014   Procedure: ESOPHAGOGASTRODUODENOSCOPY (EGD) WITH PROPOFOL ;  Surgeon: Toribio SHAUNNA Cedar, MD;  Location: [facility];  Service: Endoscopy;  Laterality: N/A;   ESOPHAGOGASTRODUODENOSCOPY (EGD) WITH PROPOFOL  N/A 03/19/2021   Procedure: ESOPHAGOGASTRODUODENOSCOPY (EGD) WITH PROPOFOL ;  Surgeon: Wilhelmenia Aloha Raddle., MD;  Location: [facility];  Service: Gastroenterology;  Laterality: N/A;   ESOPHAGOGASTRODUODENOSCOPY (EGD) WITH PROPOFOL  N/A 03/23/2021   Procedure: ESOPHAGOGASTRODUODENOSCOPY (EGD) WITH PROPOFOL ;  Surgeon: Aneita Gwendlyn DASEN, MD;  Location: [facility];  Service: Endoscopy;  Laterality: N/A;   ESOPHAGOGASTRODUODENOSCOPY (EGD) WITH PROPOFOL  N/A 03/13/2023   Procedure: ESOPHAGOGASTRODUODENOSCOPY (EGD) WITH PROPOFOL ;  Surgeon: Wilhelmenia Aloha Raddle., MD;  Location: [facility];  Service: Gastroenterology;  Laterality: N/A;  WITH SIDE SCOPE   hernia surgery x 3     Bilateral Inguinal, Umbicial   INSERTION OF MESH N/A 03/20/2018   Procedure: INSERTION OF MESH;  Surgeon: Ethyl Lenis, MD;  Location: [facility] OR;  Service: General;  Laterality: N/A;   IRRIGATION AND DEBRIDEMENT ABSCESS Left 08/14/2012   Procedure: IRRIGATION AND  DEBRIDEMENT LEFT INGUINAL BOIL ;  Surgeon: Arlena LILLETTE Gal, MD;  Location: [facility];  Service: Urology;  Laterality: Left;   JOINT REPLACEMENT Right 2017   KNEE ARTHROPLASTY     LEFT HEART CATH AND CORONARY ANGIOGRAPHY N/A 01/24/2019   Procedure: LEFT HEART CATH AND CORONARY ANGIOGRAPHY;  Surgeon: Swaziland, Peter M, MD;  Location[facility] INVASIVE CV LAB;  Service: Cardiovascular;  Laterality: N/A;   LEFT HEART CATHETERIZATION WITH CORONARY ANGIOGRAM N/A 05/26/2014   Procedure: LEFT HEART CATHETERIZATION WITH CORONARY ANGIOGRAM;  Surgeon: Ozell JONETTA Fell, MD;  Location: [facility] CATH LAB;  Service: Cardiovascular;  Laterality: N/A;   LUMBAR LAMINECTOMY/DECOMPRESSION MICRODISCECTOMY Left 09/26/2014   Procedure: Lumbar Laminectomy for resection of synovial cyst  Lumbar  five- sacral one left;  Surgeon: Arley Helling, MD;  Location: [facility];  Service: Neurosurgery;  Laterality: Left;   MOLE REMOVAL Left 03/20/2018   Procedure: MOLE REMOVAL;  Surgeon: Ethyl Lenis, MD;  Location: [facility] OR;  Service: General;  Laterality: Left;   Oral surg to removed growth from ?sinus  10/2010   ?Dermoid removed by DrRiggs   PACEMAKER INSERTION  04/03/2017   PANCREATIC STENT PLACEMENT  03/19/2021   Procedure: PANCREATIC STENT PLACEMENT;  Surgeon: Wilhelmenia Aloha Raddle., MD;  Location: [facility];  Service: Gastroenterology;;   POLYPECTOMY     RCA stenting     '05 RCA   REMOVAL OF STONES  11/29/2021   Procedure: REMOVAL OF SLUDGE;  Surgeon: Wilhelmenia Aloha Raddle., MD;  Location: [facility] ENDOSCOPY;  Service: Gastroenterology;;   right toe surgery  Right    Cyst    RIGHT/LEFT HEART CATH AND CORONARY ANGIOGRAPHY N/A 01/15/2017   Procedure: RIGHT/LEFT HEART CATH AND CORONARY ANGIOGRAPHY;  Surgeon: Wonda Sharper, MD;  Location: [facility] INVASIVE CV LAB;  Service: Cardiovascular;  Laterality: N/A;   s/p right knee arthroscopy  2005   SPHINCTEROTOMY  03/19/2021   Procedure: SPHINCTEROTOMY;  Surgeon: Mansouraty, Aloha Raddle., MD;  Location: [facility];  Service: Gastroenterology;;   CLEDA REMOVAL  11/29/2021   Procedure: STENT REMOVAL;  Surgeon: Wilhelmenia Aloha Raddle., MD;  Location: [facility] ENDOSCOPY;  Service: Gastroenterology;;   SUBMUCOSAL LIFTING INJECTION  03/19/2021   Procedure: SUBMUCOSAL LIFTING INJECTION;  Surgeon: Wilhelmenia Aloha Raddle., MD;  Location: [facility];  Service: Gastroenterology;;   thumb surgery Right    TOTAL KNEE ARTHROPLASTY Right 03/12/2016   Procedure: RIGHT TOTAL KNEE ARTHROPLASTY;  Surgeon: Donnice Car, MD;  Location: [facility];  Service: Orthopedics;  Laterality: Right;   TOTAL KNEE ARTHROPLASTY Left 12/10/2021   Procedure: TOTAL KNEE ARTHROPLASTY;  Surgeon: Melodi Lerner, MD;  Location: [facility];  Service: Orthopedics;  Laterality: Left;   UPPER ESOPHAGEAL ENDOSCOPIC ULTRASOUND (EUS) N/A 03/19/2021   Procedure: UPPER ESOPHAGEAL ENDOSCOPIC ULTRASOUND (EUS);  Surgeon: Wilhelmenia Aloha Raddle., MD;  Location: [facility];  Service: Gastroenterology;  Laterality: N/A;   UPPER GASTROINTESTINAL ENDOSCOPY     VENTRAL HERNIA REPAIR N/A 03/20/2018   Procedure: LAPAROSCOPIC VENTRAL INCISIONAL  HERNIA ERAS PATHWAY;  Surgeon: Ethyl Lenis, MD;  Location: [facility] OR;  Service: General;  Laterality: N/A;   Social History   Social History Narrative   Not on file   Immunization History  Administered Date(s) Administered   Fluad Quad(high Dose 65+) 02/18/2019, 02/21/2022   Influenza Split 03/21/2011, 03/03/2023   Influenza Whole 04/06/2009, 04/09/2010   Influenza, High Dose Seasonal PF 04/20/2015, 04/15/2016, 02/21/2017   Influenza,inj,Quad PF,6+ Mos 03/24/2014   Influenza-Unspecified 02/26/2018, 02/18/2019, 02/26/2020   PFIZER(Purple Top)SARS-COV-2 Vaccination 05/27/2019, 06/17/2019, 02/15/2020   Pfizer Covid-19 Vaccine Bivalent Booster 65yrs & up 02/21/2022   Pneumococcal Conjugate-13 11/29/2014   Pneumococcal Polysaccharide-23 06/25/2018   Respiratory Syncytial Virus Vaccine,Recomb Aduvanted(Arexvy) 02/24/2023    Rsv, Mab, [place], 0.5 Ml, Neonate To 24 Mos(Beyfortus) 02/21/2022   Tdap 11/29/2014   Unspecified SARS-COV-2 Vaccination 02/03/2022   Zoster Recombinant(Shingrix) 06/07/2018, 09/26/2018   Zoster, Live 05/20/2006     Objective: Vital Signs: BP (!) 119/55 (BP Location: Right Arm, Patient Position: Sitting, Cuff Size: Normal)   Pulse 76   Resp 14   Ht 5' 7 (1.702 m)   Wt 185 lb 6.4 oz (84.1 kg)   BMI 29.04 kg/m    Physical Exam Vitals and nursing note reviewed.  Constitutional:  Appearance: He is well-developed.  HENT:     Head: Normocephalic and atraumatic.  Eyes:     Conjunctiva/sclera: Conjunctivae normal.     Pupils: Pupils are equal, round, and reactive to light.  Cardiovascular:     Rate and Rhythm: Normal rate and regular rhythm.     Heart sounds: Normal heart sounds.  Pulmonary:     Effort: Pulmonary effort is normal.     Breath sounds: Normal breath sounds.  Abdominal:     General: Bowel sounds are normal.     Palpations: Abdomen is soft.  Musculoskeletal:     Cervical back: Normal range of motion and neck supple.  Skin:    General: Skin is warm and dry.     Capillary Refill: Capillary refill takes less than 2 seconds.  Neurological:     Mental Status: He is alert and oriented to person, place, and time.  Psychiatric:        Behavior: Behavior normal.      Musculoskeletal Exam: He had limited lateral rotation of the cervical spine.  He had discomfort range of motion of his lumbar spine.  Shoulders, elbows, wrist joints with good range of motion.  He had bilateral MCP thickening with no synovitis.  PIP and DIP thickening and subluxation of PIP and DIP joints was noted.  Hip joints were knee joints with good range of motion without any warmth swelling or effusion.  Bilateral knee joints were replaced.  There was no tenderness over her ankles or MTPs.  CDAI Exam: CDAI Score: -- Patient Global: --; Provider Global: -- Swollen: --; Tender: -- Joint  Exam 01/07/2024   No joint exam has been documented for this visit   There is currently no information documented on the homunculus. Go to the Rheumatology activity and complete the homunculus joint exam.  Investigation: No additional findings.  Imaging: No results found.   Recent Labs: Lab Results  Component Value Date   WBC 6.2 12/01/2023   HGB 14.2 12/01/2023   PLT 242 12/01/2023   NA 140 12/01/2023   K 4.3 12/01/2023   CL 106 12/01/2023   CO2 26 12/01/2023   GLUCOSE 119 (H) 12/01/2023   BUN 13 12/01/2023   CREATININE 0.74 12/01/2023   BILITOT 1.1 12/01/2023   ALKPHOS 63 06/10/2023   AST 20 12/01/2023   ALT 19 12/01/2023   PROT 6.3 12/01/2023   ALBUMIN 4.5 06/10/2023   CALCIUM  9.9 12/01/2023   GFRAA 100 11/09/2020    Speciality Comments: PLQ eye exam:10/09/2023 [facility] Follow up 1 year.   Procedures:  No procedures performed Allergies: Sulfonamide derivatives   Assessment / Plan:     Visit Diagnoses: Seropositive rheumatoid arthritis (HCC) RF-, anti-CCP+: He continues to have pain and discomfort in multiple joints.  No synovitis was noted on the examination.  High risk medication use - Plaquenil  200 mg p.o. daily Monday to Friday.  PLQ eye exam:10/09/2023.  December 01, 2023 CBC and CMP were normal.  Eye examination was advised on an annual basis.  Labs will be done every 5 months.  Information reimmunization was placed in the AVS.  Medication monitoring encounter - tramadol  50 mg p.o. p.o. nightly as needed pain.  Patient is unable to sleep without tramadol . UDS & narcotic agreement: 07/01/2023 - Plan: DRUG MONITOR, TRAMADOL ,QN, URINE, DRUG MONITOR, PANEL 5, W/CONF, URINE  Primary osteoarthritis of both hands-he has severe osteoarthritis and rheumatoid arthritis overlap.  No synovitis was noted.  DIP and PIP subluxation was  noted.  Joint protection muscle strengthening was discussed.  Status post bilateral knee replacements - LTR July 2023.RTR October  2017.  Doing well.  He had no warmth swelling or effusion.  Primary osteoarthritis of both feet - Followed by Dr. Kit.  He has a stiffness in his feet.  Chronic left SI joint pain-he continues to have chronic discomfort.  Trochanteric bursitis, left hip-patient states he was evaluated in [facility] and has been doing some stretching exercises.  Spondylosis of lumbar spine-he reports chronic pain.  Osteopenia of multiple sites - May 02, 2021 DEXA scan showed T-score of -1.6 BMD 0.706 in the left femoral neck.  Calcium  rich diet and vitamin D  was advised.  Bone repeat DEXA scan.  Vitamin D  deficiency-will check vitamin D  level with next labs.  Vitamin D  was low at 28.36 on June 10, 2023.  Other medical problems are listed as follows:  History of coronary artery disease  History of hypertension  History of CHF (congestive heart failure)  Pacemaker-patient states that he will be getting pacemaker replaced in the first week of September as his battery is low.  History of hyperlipidemia  History of gastric polyp  History of gastroesophageal reflux (GERD)  History of sleep apnea  History of colon polyps  History of BPH  Orders: Orders Placed This Encounter  Procedures   DRUG MONITOR, TRAMADOL ,QN, URINE   DRUG MONITOR, PANEL 5, W/CONF, URINE   No orders of the defined types were placed in this encounter.    Follow-Up Instructions: Return in about 4 months (around 05/08/2024).   Maya Nash, MD  Note - This record has been created using Animal nutritionist.  Chart creation errors have been sought, but may not always  have been located. Such creation errors do not reflect on  the standard of medical care.

## 2023-12-26 NOTE — Progress Notes (Signed)
 EPIC Encounter for ICM Monitoring  Patient Name: [name] is a 78 y.o. male Date: 12/26/2023 Primary Care Physican: [name], MD Primary Cardiologist: Wonda Electrophysiologist: Inocencio Bi-V Pacing:   97.1%   07/14/2023 Office Weight: 187 lbs 09/24/2023 Office Weight: 187 lbs   Time in AT/AF  0.0 hr/day (0.0%)  Battery Longevity: RRT 11/23/2023 - Battery Change scheduled 9/3                                                          Transmission results reviewed.    Diet:  Does eat restaurant foods.     Optivol thoracic impedance suggesting normal fluid levels within the last month.   Prescribed: Spironolactone  25 mg take 0.5 tablet daily.   Labs: 12/01/2023 Creatinine 0.74, BUN 13, Potassium 4.3, Sodium 140, GFR 92 06/10/2023 Creatinine 0.89, BUN 23, Potassium 3.8, Sodium 139, GFR 81.65 A complete set of results can be found in Results Review.   Recommendations:  No changes.   Follow-up plan: ICM clinic phone appointment on 03/01/2024 (to allow development of Thoracic impedance baseline after 9/3 battery change).   91 day device clinic remote transmission 01/29/2024.       EP/Cardiology Office Visits:  01/07/2024 with Dr Wonda.       Copy of ICM check sent to Dr. Inocencio.     3 month ICM trend: 12/22/2023.    12-14 Month ICM trend:     Mitzie GORMAN Garner, RN 12/26/2023 2:30 PM

## 2023-12-30 ENCOUNTER — Encounter

## 2024-01-07 ENCOUNTER — Encounter: Payer: Self-pay | Admitting: Cardiovascular Disease

## 2024-01-07 ENCOUNTER — Encounter: Payer: Self-pay | Admitting: Rheumatology

## 2024-01-07 ENCOUNTER — Ambulatory Visit: Payer: Medicare Other | Attending: Rheumatology | Admitting: Rheumatology

## 2024-01-07 ENCOUNTER — Ambulatory Visit (INDEPENDENT_AMBULATORY_CARE_PROVIDER_SITE_OTHER): Admitting: Cardiovascular Disease

## 2024-01-07 VITALS — BP 124/68 | HR 78 | Resp 16 | Ht 67.0 in | Wt 184.6 lb

## 2024-01-07 VITALS — BP 119/55 | HR 76 | Resp 14 | Ht 67.0 in | Wt 185.4 lb

## 2024-01-07 DIAGNOSIS — Z8679 Personal history of other diseases of the circulatory system: Secondary | ICD-10-CM | POA: Diagnosis present

## 2024-01-07 DIAGNOSIS — M19071 Primary osteoarthritis, right ankle and foot: Secondary | ICD-10-CM | POA: Insufficient documentation

## 2024-01-07 DIAGNOSIS — E782 Mixed hyperlipidemia: Secondary | ICD-10-CM

## 2024-01-07 DIAGNOSIS — M533 Sacrococcygeal disorders, not elsewhere classified: Secondary | ICD-10-CM | POA: Diagnosis present

## 2024-01-07 DIAGNOSIS — Z96653 Presence of artificial knee joint, bilateral: Secondary | ICD-10-CM | POA: Insufficient documentation

## 2024-01-07 DIAGNOSIS — I5022 Chronic systolic (congestive) heart failure: Secondary | ICD-10-CM | POA: Diagnosis present

## 2024-01-07 DIAGNOSIS — Z860100 Personal history of colon polyps, unspecified: Secondary | ICD-10-CM | POA: Diagnosis present

## 2024-01-07 DIAGNOSIS — I480 Paroxysmal atrial fibrillation: Secondary | ICD-10-CM | POA: Diagnosis present

## 2024-01-07 DIAGNOSIS — M19042 Primary osteoarthritis, left hand: Secondary | ICD-10-CM | POA: Diagnosis present

## 2024-01-07 DIAGNOSIS — Z8719 Personal history of other diseases of the digestive system: Secondary | ICD-10-CM | POA: Insufficient documentation

## 2024-01-07 DIAGNOSIS — Z79899 Other long term (current) drug therapy: Secondary | ICD-10-CM | POA: Diagnosis present

## 2024-01-07 DIAGNOSIS — Z8639 Personal history of other endocrine, nutritional and metabolic disease: Secondary | ICD-10-CM | POA: Insufficient documentation

## 2024-01-07 DIAGNOSIS — I2510 Atherosclerotic heart disease of native coronary artery without angina pectoris: Secondary | ICD-10-CM | POA: Insufficient documentation

## 2024-01-07 DIAGNOSIS — Z8669 Personal history of other diseases of the nervous system and sense organs: Secondary | ICD-10-CM | POA: Diagnosis present

## 2024-01-07 DIAGNOSIS — Z87438 Personal history of other diseases of male genital organs: Secondary | ICD-10-CM | POA: Insufficient documentation

## 2024-01-07 DIAGNOSIS — M19072 Primary osteoarthritis, left ankle and foot: Secondary | ICD-10-CM | POA: Diagnosis present

## 2024-01-07 DIAGNOSIS — M19041 Primary osteoarthritis, right hand: Secondary | ICD-10-CM | POA: Insufficient documentation

## 2024-01-07 DIAGNOSIS — M47816 Spondylosis without myelopathy or radiculopathy, lumbar region: Secondary | ICD-10-CM | POA: Diagnosis present

## 2024-01-07 DIAGNOSIS — M8589 Other specified disorders of bone density and structure, multiple sites: Secondary | ICD-10-CM | POA: Insufficient documentation

## 2024-01-07 DIAGNOSIS — G8929 Other chronic pain: Secondary | ICD-10-CM | POA: Insufficient documentation

## 2024-01-07 DIAGNOSIS — M059 Rheumatoid arthritis with rheumatoid factor, unspecified: Secondary | ICD-10-CM | POA: Diagnosis not present

## 2024-01-07 DIAGNOSIS — Z950 Presence of cardiac pacemaker: Secondary | ICD-10-CM | POA: Insufficient documentation

## 2024-01-07 DIAGNOSIS — E559 Vitamin D deficiency, unspecified: Secondary | ICD-10-CM | POA: Insufficient documentation

## 2024-01-07 DIAGNOSIS — Z5181 Encounter for therapeutic drug level monitoring: Secondary | ICD-10-CM | POA: Insufficient documentation

## 2024-01-07 DIAGNOSIS — M7062 Trochanteric bursitis, left hip: Secondary | ICD-10-CM | POA: Diagnosis present

## 2024-01-07 NOTE — Assessment & Plan Note (Addendum)
 Anticoagulated with apixaban .  No bleeding problems reported.

## 2024-01-07 NOTE — Progress Notes (Signed)
 Cardiology Office Note:    Date:  01/07/2024   ID:  [name], DOB 1945-10-15, MRN [id number]  PCP:  [name], MD   [facility] Providers Cardiologist:  Ozell Fell, MD Electrophysiologist:  Soyla Gladis Norton, MD     Referring MD: [name], MD   Chief Complaint  Patient presents with   AF (paroxysmal atrial fibrillation)    Follow-up    History of Present Illness:    [name] is a 78 y.o. male with a hx of  chronic systolic heart failure with left bundle branch block, coronary artery disease with remote stenting of the RCA, and paroxysmal atrial fibrillation.  He presents for follow-up evaluation today.  He underwent CRT-D implantation in 2018.  He was ultimately found to have atrial fibrillation on remote device monitoring and was started on anticoagulation with apixaban .  His antiplatelet therapy was stopped at that time.  LV function normalized after cardiac resynchronization.   [name] is here alone today. He's been doing well. He is walking 2-4 miles regularly without exertional symptoms. He complains of marked fatigue in the afternoons and he has to take an afternoon nap. Other than that, he's doing quite well. Today, he denies symptoms of palpitations, orthopnea, PND, lower extremity edema, dizziness, or syncope. He has a rare chest pain and he has mild exertional dyspnea, described as shortness of breath with walking up 3 flights of stairs.   Current Medications: Current Meds  Medication Sig   acetaminophen  (TYLENOL ) 650 MG CR tablet Take 650 mg by mouth in the morning and at bedtime.   albuterol  (VENTOLIN  HFA) 108 (90 Base) MCG/ACT inhaler Inhale 2 puffs into the lungs every 6 (six) hours as needed for wheezing or shortness of breath.   atorvastatin  (LIPITOR) 20 MG tablet TAKE 1 TABLET BY MOUTH DAILY   azelastine  (ASTELIN ) 0.1 % nasal spray Place 2 sprays into both nostrils 2 (two) times daily as needed for rhinitis. Use in each nostril as  directed   b complex vitamins tablet Take 1 tablet by mouth daily with lunch.   benzonatate  (TESSALON ) 200 MG capsule Take 1 capsule (200 mg total) by mouth 3 (three) times daily as needed for cough.   betamethasone dipropionate 0.05 % cream    budesonide -formoterol  (SYMBICORT ) 160-4.5 MCG/ACT inhaler Inhale 2 puffs into the lungs every 12 (twelve) hours.   carvedilol  (COREG ) 25 MG tablet TAKE 1 TABLET BY MOUTH TWICE DAILY   cholecalciferol (VITAMIN D3) 25 MCG (1000 UNIT) tablet Take 1,000 Units by mouth daily.   Coenzyme Q10 (CO Q 10 PO) Take 200 mg by mouth daily with lunch.   ELIQUIS  5 MG TABS tablet TAKE 1 TABLET(5 MG) BY MOUTH TWICE DAILY   Flaxseed, Linseed, (FLAXSEED OIL PO) Take 1 capsule by mouth daily. With Omega 3   fluocinonide (LIDEX) 0.05 % external solution as needed.   folic acid  (FOLVITE ) 400 MCG tablet Take 400 mcg by mouth daily.   Glucosamine HCl (GLUCOSAMINE PO) Take 1,500 mg by mouth every evening.   hydrocortisone  2.5 % cream Apply 1 application topically 2 (two) times daily as needed (rash on nose).    hydroxychloroquine  (PLAQUENIL ) 200 MG tablet TAKE 1 TABLET BY MOUTH DAILY MONDAY THROUGH FRIDAY AND SKIP SATURDAY AND SUNDAY   ipratropium (ATROVENT ) 0.06 % nasal spray Place 2 sprays into both nostrils 2 (two) times daily as needed for rhinitis.   Lutein  20 MG TABS Take 20 mg by mouth daily.   Multiple  Vitamin (MULTIVITAMIN) capsule Take 1 capsule by mouth daily.     nitroGLYCERIN  (NITROSTAT ) 0.4 MG SL tablet Dissolve 1 tablet under the tongue every 5 minutes as needed for chest pain. Max of 3 doses, then 911.   NON FORMULARY CPAP machine with sleep.   omeprazole  (PRILOSEC) 40 MG capsule TAKE 1 CAPSULE(40 MG) BY MOUTH IN THE MORNING AND AT BEDTIME   pantoprazole  (PROTONIX ) 40 MG tablet Take 1 tablet (40 mg total) by mouth daily. before breakfast   polyethylene glycol (MIRALAX  / GLYCOLAX ) 17 g packet Take 17 g by mouth daily as needed for mild constipation.   predniSONE   (DELTASONE ) 20 MG tablet Take 1 tablet (20 mg total) by mouth daily with breakfast.   Probiotic Product (PROBIOTIC DAILY PO) Take 1 capsule by mouth daily with lunch.    RESTASIS  0.05 % ophthalmic emulsion Place 1 drop into both eyes 2 (two) times daily as needed (dry eyes).   sacubitril -valsartan  (ENTRESTO ) 49-51 MG TAKE 1 TABLET BY MOUTH TWICE DAILY   Saw Palmetto, Serenoa repens, (SAW PALMETTO PO) Take 450 mg by mouth every evening.   spironolactone  (ALDACTONE ) 25 MG tablet Take 1 tablet (25 mg total) by mouth daily.   tamsulosin  (FLOMAX ) 0.4 MG CAPS capsule Take 0.4 mg by mouth daily.   Testosterone  20 % CREA Apply 2 mLs topically daily. Rub on shoulder   tobramycin-dexamethasone  (TOBRADEX) ophthalmic ointment as needed.   traMADol  (ULTRAM ) 50 MG tablet Take 1 tablet (50 mg total) by mouth at bedtime as needed.   TURMERIC PO Take 1 tablet by mouth daily with lunch.    vitamin C (ASCORBIC ACID) 500 MG tablet Take 500 mg by mouth daily.   zinc gluconate 50 MG tablet Take 50 mg by mouth daily.     Allergies:   Sulfonamide derivatives   ROS:   Please see the history of present illness.    All other systems reviewed and are negative.  EKGs/Labs/Other Studies Reviewed:    The following studies were reviewed today: Cardiac Studies & Procedures   ______________________________________________________________________________________________ CARDIAC CATHETERIZATION  CARDIAC CATHETERIZATION 01/24/2019  Conclusion  Mid LAD lesion is 30% stenosed.  Non-stenotic Mid RCA lesion was previously treated.  The left ventricular systolic function is normal.  LV end diastolic pressure is normal.  The left ventricular ejection fraction is 55-65% by visual estimate.  1. Nonobstructive CAD. Continued patency of the stent in the RCA 2. Good LV systolic function 3. Normal LVEDP  Plan: will obtain an Echo. Consider a trial of colchicine . Resume Eliquis  tomorrow am.  Findings Coronary  Findings Diagnostic  Dominance: Right  Left Anterior Descending The vessel exhibits minimal luminal irregularities. Mid LAD lesion is 30% stenosed.  Left Circumflex The vessel exhibits minimal luminal irregularities.  Right Coronary Artery The vessel exhibits minimal luminal irregularities. Non-stenotic Mid RCA lesion was previously treated.  Intervention  No interventions have been documented.   CARDIAC CATHETERIZATION  CARDIAC CATHETERIZATION 01/15/2017  Conclusion 1. Widely patent coronary arteries with continued patency of the stented segment in the right coronary artery 2. Mild nonobstructive coronary artery disease as detailed with mild stenosis of the proximal to mid LAD and otherwise minimal luminal irregularities 3. Well compensated right-sided cardiac hemodynamics  Recommendation: Aggressive medical therapy. Refer to EP as an outpatient for consideration of CRT-D.  Findings Coronary Findings Diagnostic  Dominance: Right  Left Anterior Descending The vessel exhibits minimal luminal irregularities.  Left Circumflex The vessel exhibits minimal luminal irregularities.  Right Coronary Artery The vessel exhibits  minimal luminal irregularities. Previously placed Mid RCA drug eluting stent is widely patent.  Intervention  No interventions have been documented.   STRESS TESTS  MYOCARDIAL PERFUSION IMAGING 12/12/2022  Interpretation Summary   The study shows normal perfusion. The study is intermediate risk due to mildly reduced EF on study (appears globally hypokinetic and worse in the inferior segments; does not appear to be related to V-pacing). Recommend correlation with echo for better assessment.   No ST deviation was noted.   LV perfusion is normal. There is no evidence of ischemia.   Left ventricular function is abnormal. Nuclear stress EF: 43%. The left ventricular ejection fraction is moderately decreased (30-44%). End diastolic cavity size is normal.  End systolic cavity size is normal.   Prior study not available for comparison.  Powell Sorrow, MD   ECHOCARDIOGRAM  ECHOCARDIOGRAM COMPLETE 07/02/2022  Narrative ECHOCARDIOGRAM REPORT    Patient Name:   ANTONE SUMMONS Date of Exam: 07/02/2022 Medical Rec #:  [id number]      Height:       67.0 in Accession #:    [id number]     Weight:       191.0 lb Date of Birth:  26-May-1945     BSA:          1.983 m Patient Age:    20 years       BP:           110/70 mmHg Patient Gender: M              HR:           75 bpm. Exam Location:  [address]  Procedure: 2D Echo, Cardiac Doppler and Color Doppler  Indications:    I50.22 CHF  History:        Patient has prior history of Echocardiogram examinations, most recent 06/22/2020. CHF, CAD, ICD, Arrythmias:Atrial Fibrillation and LBBB; Risk Factors:Hypertension and Dyslipidemia.  Sonographer:    Elsie Bohr RDCS Referring Phys: [phone] Lela Murfin   Sonographer Comments: Technically difficult study due to poor echo windows. IMPRESSIONS   1. Left ventricular ejection fraction, by estimation, is 50 to 55%. The left ventricle has low normal function. The left ventricle has no regional wall motion abnormalities. There is moderate concentric left ventricular hypertrophy. Left ventricular diastolic parameters are consistent with Grade I diastolic dysfunction (impaired relaxation). 2. Right ventricular systolic function is normal. The right ventricular size is normal. There is normal pulmonary artery systolic pressure. 3. Left atrial size was moderately dilated. 4. The mitral valve is normal in structure. Trivial mitral valve regurgitation. No evidence of mitral stenosis. 5. The aortic valve is tricuspid. Aortic valve regurgitation is trivial. No aortic stenosis is present. 6. The inferior vena cava is normal in size with greater than 50% respiratory variability, suggesting right atrial pressure of 3 mmHg.  FINDINGS Left Ventricle: Left  ventricular ejection fraction, by estimation, is 50 to 55%. The left ventricle has low normal function. The left ventricle has no regional wall motion abnormalities. The left ventricular internal cavity size was normal in size. There is moderate concentric left ventricular hypertrophy. Left ventricular diastolic parameters are consistent with Grade I diastolic dysfunction (impaired relaxation). Indeterminate filling pressures.  Right Ventricle: The right ventricular size is normal. No increase in right ventricular wall thickness. Right ventricular systolic function is normal. There is normal pulmonary artery systolic pressure. The tricuspid regurgitant velocity is 2.50 m/s, and with an assumed right atrial pressure of 3 mmHg, the estimated right  ventricular systolic pressure is 28.0 mmHg.  Left Atrium: Left atrial size was moderately dilated.  Right Atrium: Right atrial size was normal in size.  Pericardium: There is no evidence of pericardial effusion.  Mitral Valve: The mitral valve is normal in structure. Trivial mitral valve regurgitation. No evidence of mitral valve stenosis.  Tricuspid Valve: The tricuspid valve is normal in structure. Tricuspid valve regurgitation is mild . No evidence of tricuspid stenosis.  Aortic Valve: The aortic valve is tricuspid. Aortic valve regurgitation is trivial. Aortic regurgitation PHT measures 498 msec. No aortic stenosis is present.  Pulmonic Valve: The pulmonic valve was normal in structure. Pulmonic valve regurgitation is mild. No evidence of pulmonic stenosis.  Aorta: The aortic root is normal in size and structure.  Venous: The inferior vena cava is normal in size with greater than 50% respiratory variability, suggesting right atrial pressure of 3 mmHg.  IAS/Shunts: No atrial level shunt detected by color flow Doppler.   LEFT VENTRICLE PLAX 2D LVIDd:         5.40 cm   Diastology LVIDs:         3.70 cm   LV e' medial:    5.11 cm/s LV PW:          1.50 cm   LV E/e' medial:  12.6 LV IVS:        1.40 cm   LV e' lateral:   5.22 cm/s LVOT diam:     2.50 cm   LV E/e' lateral: 12.4 LV SV:         69 LV SV Index:   35 LVOT Area:     4.91 cm   RIGHT VENTRICLE            IVC RVSP:           28.0 mmHg  IVC diam: 1.10 cm  LEFT ATRIUM             Index        RIGHT ATRIUM           Index LA diam:        4.30 cm 2.17 cm/m   RA Pressure: 3.00 mmHg LA Vol (A2C):   74.3 ml 37.47 ml/m  RA Area:     13.20 cm LA Vol (A4C):   56.5 ml 28.49 ml/m  RA Volume:   28.30 ml  14.27 ml/m LA Biplane Vol: 66.5 ml 33.53 ml/m AORTIC VALVE LVOT Vmax:   67.10 cm/s LVOT Vmean:  45.600 cm/s LVOT VTI:    0.140 m AI PHT:      498 msec  AORTA Ao Root diam: 3.50 cm Ao Asc diam:  3.30 cm  MITRAL VALVE               TRICUSPID VALVE MV Area (PHT): 2.50 cm    TR Peak grad:   25.0 mmHg MV Decel Time: 304 msec    TR Vmax:        250.00 cm/s MV E velocity: 64.50 cm/s  Estimated RAP:  3.00 mmHg MV A velocity: 87.00 cm/s  RVSP:           28.0 mmHg MV E/A ratio:  0.74 SHUNTS Systemic VTI:  0.14 m Systemic Diam: 2.50 cm  Annabella Scarce MD Electronically signed by Annabella Scarce MD Signature Date/Time: 07/02/2022/1:27:59 PM    Final        CARDIAC MRI  MR CARDIAC MORPHOLOGY W WO CONTRAST 08/07/2011  Narrative Cardiac MRI:  Indication:  Assess  EF History of CAD and poor acoustic windows on echo  Protocol:  The patient was scanned on a 1.5 Tesla GE magnet.  A dedicated cardiac coil was used.  Functional imaging was done using Fiesta sequences.  2,3 and 4 chamber views were done to assess RWMA;s.  Quantitiave EF was calculated using Circle software on a dedicated work station.  The patient received 30cc of Multihance . After 10 minutes inversion recovery sequences were done to assess for infarct or scar  Findings: There was mild LVE.  There was mild LAE.  Right sided cardiac chambers were normal.  There was no VSD/ASD.  There was  no pericardial effusion.  There was no discrete RWMA;s.  There were no areas of thinning. There was abnormal septal motion.  Quantitative EF was 48% ( EDV 148cc, ESV 76cc, SV 72 cc).  Hyperenhancement images showed no areas of scar or infarct  Impression:  1)    Mild LVE with abnormal septal motion EF 48%  2)    No infarct or scar on hyperenhancement images 3)    Mild LAE 4)    Normal right sided cardiac chambers  Maude Emmer MD [facility]  Cc: Dr Ozell Fell  Original Report Authenticated By: LAURINA   ______________________________________________________________________________________________      EKG:        Recent Labs: 06/10/2023: Pro B Natriuretic peptide (BNP) 55.0; TSH 3.39 12/01/2023: ALT 19; BUN 13; Creat 0.74; Hemoglobin 14.2; Platelets 242; Potassium 4.3; Sodium 140  Recent Lipid Panel    Component Value Date/Time   CHOL 135 06/10/2023 1408   CHOL 138 11/27/2016 0914   TRIG 345.0 (H) 06/10/2023 1408   HDL 31.60 (L) 06/10/2023 1408   HDL 35 (L) 11/27/2016 0914   CHOLHDL 4 06/10/2023 1408   VLDL 69.0 (H) 06/10/2023 1408   LDLCALC 34 06/10/2023 1408   LDLCALC 65 11/27/2016 0914   LDLDIRECT 75.0 09/27/2021 0815     Risk Assessment/Calculations:    CHA2DS2-VASc Score = 5   This indicates a 7.2% annual risk of stroke. The patient's score is based upon: CHF History: 1 HTN History: 1 Diabetes History: 0 Stroke History: 0 Vascular Disease History: 1 Age Score: 2 Gender Score: 0               Physical Exam:    VS:  BP 124/68 (BP Location: Left Arm, Patient Position: Sitting, Cuff Size: Normal)   Pulse 78   Resp 16   Ht 5' 7 (1.702 m)   Wt 184 lb 9.6 oz (83.7 kg)   SpO2 91%   BMI 28.91 kg/m     Wt Readings from Last 3 Encounters:  01/07/24 184 lb 9.6 oz (83.7 kg)  12/03/23 186 lb (84.4 kg)  09/24/23 187 lb 3.2 oz (84.9 kg)     GEN:  Well nourished, well developed in no acute distress HEENT: Normal NECK: No JVD; No carotid  bruits LYMPHATICS: No lymphadenopathy CARDIAC: RRR, no murmurs, rubs, gallops RESPIRATORY:  Clear to auscultation without rales, wheezing or rhonchi  ABDOMEN: Soft, non-tender, non-distended MUSCULOSKELETAL:  No edema; No deformity  SKIN: Warm and dry NEUROLOGIC:  Alert and oriented x 3 PSYCHIATRIC:  Normal affect   Assessment & Plan Coronary artery disease involving native coronary artery of native heart without angina pectoris Off of antiplatelet therapy, treated with oral anticoagulation in the setting of afib.  No anginal symptoms at present. Chronic systolic congestive heart failure (HCC) NYHA Functional class 2 limitation. Pro-BNP 05/2023 is normal at 55.  Continue current medical management which includes carvedilol , Entresto , and spironolactone .  Most recent labs reviewed.  Potassium 4.3, creatinine normal at 0.74.  Patient scheduled for upcoming generator change.  Labs to be drawn today. AF (paroxysmal atrial fibrillation) (HCC) Anticoagulated with apixaban .  No bleeding problems reported. Mixed hyperlipidemia Chol 135, HDL 32, LDL 34, Trig 345 on atorvastatin  20 mg daily.            Medication Adjustments/Labs and Tests Ordered: Current medicines are reviewed at length with the patient today.  Concerns regarding medicines are outlined above.  No orders of the defined types were placed in this encounter.  No orders of the defined types were placed in this encounter.   There are no Patient Instructions on file for this visit.   Signed, Ozell Fell, MD  01/07/2024 9:52 AM    [facility]

## 2024-01-07 NOTE — Assessment & Plan Note (Addendum)
 Off of antiplatelet therapy, treated with oral anticoagulation in the setting of afib.  No anginal symptoms at present.

## 2024-01-07 NOTE — Patient Instructions (Signed)
 Vaccines You are taking a medication(s) that can suppress your immune system.  The following immunizations are recommended: Flu annually Covid-19  Td/Tdap (tetanus, diphtheria, pertussis) every 10 years Pneumonia (Prevnar 15 then Pneumovax 23 at least 1 year apart.  Alternatively, can take Prevnar 20 without needing additional dose) Shingrix: 2 doses from 4 weeks to 6 months apart  Please check with your PCP to make sure you are up to date.

## 2024-01-07 NOTE — Patient Instructions (Signed)
 Medication Instructions:  No medication changes were made at this visit. Continue current regimen.   *If you need a refill on your cardiac medications before your next appointment, please call your pharmacy*  Lab Work: None ordered today. If you have labs (blood work) drawn today and your tests are completely normal, you will receive your results only by: MyChart Message (if you have MyChart) OR A paper copy in the mail If you have any lab test that is abnormal or we need to change your treatment, we will call you to review the results.  Testing/Procedures: None ordered today.  Follow-Up: At [facility], you and your health needs are our priority.  As part of our continuing mission to provide you with exceptional heart care, our providers are all part of one team.  This team includes your primary Cardiologist (physician) and Advanced Practice Providers or APPs (Physician Assistants and Nurse Practitioners) who all work together to provide you with the care you need, when you need it.  Your next appointment:   6 month(s)  Provider:   Ozell Fell, MD

## 2024-01-07 NOTE — Assessment & Plan Note (Signed)
 Chol 135, HDL 32, LDL 34, Trig 345 on atorvastatin  20 mg daily.

## 2024-01-08 ENCOUNTER — Ambulatory Visit: Payer: Self-pay | Admitting: *Deleted

## 2024-01-08 LAB — BASIC METABOLIC PANEL WITH GFR
BUN/Creatinine Ratio: 18 (ref 10–24)
BUN: 15 mg/dL (ref 8–27)
CO2: 22 mmol/L (ref 20–29)
Calcium: 9.7 mg/dL (ref 8.6–10.2)
Chloride: 102 mmol/L (ref 96–106)
Creatinine, Ser: 0.85 mg/dL (ref 0.76–1.27)
Glucose: 92 mg/dL (ref 70–99)
Potassium: 4.5 mmol/L (ref 3.5–5.2)
Sodium: 138 mmol/L (ref 134–144)
eGFR: 88 mL/min/1.73 (ref >59)

## 2024-01-08 LAB — CBC
Hematocrit: 43.7 % (ref 37.5–51.0)
Hemoglobin: 14.3 g/dL (ref 13.0–17.7)
MCH: 29.9 pg (ref 26.6–33.0)
MCHC: 32.7 g/dL (ref 31.5–35.7)
MCV: 91 fL (ref 79–97)
Platelets: 252 x10E3/uL (ref 150–450)
RBC: 4.79 x10E6/uL (ref 4.14–5.80)
RDW: 12.5 % (ref 11.6–15.4)
WBC: 6.1 x10E3/uL (ref 3.4–10.8)

## 2024-01-08 LAB — DM TEMPLATE

## 2024-01-09 ENCOUNTER — Ambulatory Visit: Payer: Self-pay | Admitting: Rheumatology

## 2024-01-09 LAB — DRUG MONITOR, PANEL 5, W/CONF, URINE
Amphetamines: NEGATIVE ng/mL (ref <500)
Barbiturates: NEGATIVE ng/mL (ref <300)
Benzodiazepines: NEGATIVE ng/mL (ref <100)
Cocaine Metabolite: NEGATIVE ng/mL (ref <150)
Creatinine: 90.2 mg/dL (ref >=20.0)
Marijuana Metabolite: NEGATIVE ng/mL (ref <20)
Methadone Metabolite: NEGATIVE ng/mL (ref <100)
Opiates: NEGATIVE ng/mL (ref <100)
Oxidant: NEGATIVE ug/mL (ref <200)
Oxycodone: NEGATIVE ng/mL (ref <100)
pH: 6 (ref 4.5–9.0)

## 2024-01-09 LAB — DRUG MONITOR, TRAMADOL,QN, URINE
Desmethyltramadol: 7214 ng/mL — ABNORMAL HIGH (ref <100)
Tramadol: 2147 ng/mL — ABNORMAL HIGH (ref <100)

## 2024-01-09 LAB — DM TEMPLATE

## 2024-01-09 NOTE — Progress Notes (Signed)
 UDS suggestive of tramadol  use.

## 2024-01-14 ENCOUNTER — Encounter (HOSPITAL_COMMUNITY): Payer: Self-pay

## 2024-01-19 ENCOUNTER — Encounter

## 2024-01-20 NOTE — Pre-Procedure Instructions (Signed)
 Attempted to call patient regarding procedure instructions.  Left voicemail on the following items: Arrival time 1:00 Nothing to eat or drink after midnight No meds AM of procedure Responsible person to drive you home and stay with you for 24 hrs Wash with special soap night before and morning of procedure If on anti-coagulant drug instructions Eliquis - last dose 8/31

## 2024-01-21 ENCOUNTER — Other Ambulatory Visit: Payer: Self-pay

## 2024-01-21 ENCOUNTER — Ambulatory Visit (HOSPITAL_COMMUNITY)
Admission: RE | Admit: 2024-01-21 | Discharge: 2024-01-21 | Disposition: A | Discharge status: Home / Self Care | Attending: Cardiology | Admitting: Cardiology

## 2024-01-21 ENCOUNTER — Ambulatory Visit (HOSPITAL_COMMUNITY)
Admission: RE | Disposition: A | Discharge status: Home / Self Care | Payer: Self-pay | Source: Home / Self Care | Attending: Cardiology

## 2024-01-21 DIAGNOSIS — Z7901 Long term (current) use of anticoagulants: Secondary | ICD-10-CM | POA: Insufficient documentation

## 2024-01-21 DIAGNOSIS — I110 Hypertensive heart disease with heart failure: Secondary | ICD-10-CM | POA: Diagnosis not present

## 2024-01-21 DIAGNOSIS — Z87891 Personal history of nicotine dependence: Secondary | ICD-10-CM | POA: Diagnosis not present

## 2024-01-21 DIAGNOSIS — I5022 Chronic systolic (congestive) heart failure: Secondary | ICD-10-CM | POA: Diagnosis not present

## 2024-01-21 DIAGNOSIS — I2510 Atherosclerotic heart disease of native coronary artery without angina pectoris: Secondary | ICD-10-CM | POA: Diagnosis not present

## 2024-01-21 DIAGNOSIS — I447 Left bundle-branch block, unspecified: Secondary | ICD-10-CM | POA: Diagnosis not present

## 2024-01-21 DIAGNOSIS — I255 Ischemic cardiomyopathy: Secondary | ICD-10-CM

## 2024-01-21 DIAGNOSIS — I480 Paroxysmal atrial fibrillation: Secondary | ICD-10-CM

## 2024-01-21 DIAGNOSIS — G4733 Obstructive sleep apnea (adult) (pediatric): Secondary | ICD-10-CM | POA: Insufficient documentation

## 2024-01-21 DIAGNOSIS — I4891 Unspecified atrial fibrillation: Secondary | ICD-10-CM | POA: Insufficient documentation

## 2024-01-21 DIAGNOSIS — Z955 Presence of coronary angioplasty implant and graft: Secondary | ICD-10-CM | POA: Insufficient documentation

## 2024-01-21 DIAGNOSIS — Z79899 Other long term (current) drug therapy: Secondary | ICD-10-CM | POA: Diagnosis not present

## 2024-01-21 DIAGNOSIS — Z4502 Encounter for adjustment and management of automatic implantable cardiac defibrillator: Secondary | ICD-10-CM | POA: Diagnosis not present

## 2024-01-21 HISTORY — PX: BIV ICD GENERATOR CHANGEOUT: EP1194

## 2024-01-21 LAB — GLUCOSE, CAPILLARY: Glucose-Capillary: 110 mg/dL — ABNORMAL HIGH (ref 70–99)

## 2024-01-21 SURGERY — BIV ICD GENERATOR CHANGEOUT

## 2024-01-21 MED ORDER — LIDOCAINE HCL (PF) 1 % IJ SOLN
INTRAMUSCULAR | Status: DC | PRN
  Administered 2024-01-21: 50 mL via INTRADERMAL

## 2024-01-21 MED ORDER — CHLORHEXIDINE GLUCONATE 4 % EX SOLN
4.0000 | Freq: Once | CUTANEOUS | Status: DC
  Filled 2024-01-21: qty 60

## 2024-01-21 MED ORDER — MIDAZOLAM HCL 2 MG/2ML IJ SOLN
INTRAMUSCULAR | Status: AC
  Filled 2024-01-21: qty 2

## 2024-01-21 MED ORDER — FENTANYL CITRATE (PF) 100 MCG/2ML IJ SOLN
INTRAMUSCULAR | Status: DC | PRN
Start: 2024-01-21 — End: 2024-01-21
  Administered 2024-01-21 (×2): 25 ug via INTRAVENOUS

## 2024-01-21 MED ORDER — SODIUM CHLORIDE 0.9 % IV SOLN
80.0000 mg | INTRAVENOUS | Status: AC
  Administered 2024-01-21: 80 mg

## 2024-01-21 MED ORDER — POVIDONE-IODINE 10 % EX SWAB
2.0000 | Freq: Once | CUTANEOUS | Status: AC
  Administered 2024-01-21: 2 via TOPICAL

## 2024-01-21 MED ORDER — SODIUM CHLORIDE 0.9 % IV SOLN: INTRAVENOUS | Status: DC

## 2024-01-21 MED ORDER — ONDANSETRON HCL 4 MG/2ML IJ SOLN: 4.0000 mg | Freq: Four times a day (QID) | INTRAMUSCULAR | Status: DC | PRN

## 2024-01-21 MED ORDER — LIDOCAINE HCL (PF) 1 % IJ SOLN
INTRAMUSCULAR | Status: AC
  Filled 2024-01-21: qty 60

## 2024-01-21 MED ORDER — ACETAMINOPHEN 325 MG PO TABS: 325.0000 mg | ORAL_TABLET | ORAL | Status: DC | PRN

## 2024-01-21 MED ORDER — SODIUM CHLORIDE 0.9 % IV SOLN
INTRAVENOUS | Status: AC
  Filled 2024-01-21: qty 2

## 2024-01-21 MED ORDER — CEFAZOLIN SODIUM-DEXTROSE 2-4 GM/100ML-% IV SOLN
INTRAVENOUS | Status: AC
  Filled 2024-01-21: qty 100

## 2024-01-21 MED ORDER — CEFAZOLIN SODIUM-DEXTROSE 2-4 GM/100ML-% IV SOLN
2.0000 g | INTRAVENOUS | Status: AC
  Administered 2024-01-21: 2 g via INTRAVENOUS

## 2024-01-21 MED ORDER — MIDAZOLAM HCL 5 MG/5ML IJ SOLN
INTRAMUSCULAR | Status: DC | PRN
  Administered 2024-01-21 (×2): 1 mg via INTRAVENOUS

## 2024-01-21 MED ORDER — FENTANYL CITRATE (PF) 100 MCG/2ML IJ SOLN
INTRAMUSCULAR | Status: AC
  Filled 2024-01-21: qty 2

## 2024-01-21 SURGICAL SUPPLY — 5 items
CABLE SURGICAL S-101-97-12 (CABLE) ×1 IMPLANT
ICD COBALT XT QUAD CRT DTPA2QQ (ICD Generator) IMPLANT
PAD DEFIB RADIO PHYSIO CONN (PAD) ×1 IMPLANT
POUCH AIGIS-R ANTIBACT ICD LRG (Mesh General) IMPLANT
TRAY PACEMAKER INSERTION (PACKS) ×1 IMPLANT

## 2024-01-21 NOTE — Discharge Instructions (Signed)

## 2024-01-21 NOTE — H&P (Signed)
 Electrophysiology Office Note   Date:  01/21/2024   ID:  [name], DOB 10-03-1945, MRN [id number]  PCP:  [name], MD  Cardiologist:  Wonda Primary Electrophysiologist:  Dr Inocencio    CC: Follow up for device check/new onset afib.   History of Present Illness: [name] is a 78 y.o. male who is being seen today for the evaluation of CHF at the request of No ref. provider found. Presenting today for electrophysiology evaluation.   He has a history significant for chronic systolic heart failure, coronary artery disease post RCA stent, left bundle branch block, obstructive sleep apnea on CPAP.  He is post Medtronic CRT-D implanted 03/27/2017.  Implant was complicated by microperforation.  He received colchicine  with improvement in symptoms.  He has had atrial fibrillation and is now on Eliquis .  He has had normalization of his LV function.  Today, denies symptoms of palpitations, chest pain, dyspnea, orthopnea, PND, lower extremity edema, claudication, dizziness, presyncope, syncope, bleeding, or neurologic sequela. The patient is tolerating medications without difficulties. Plan ICD gen change.    Past Medical History:  Diagnosis Date   AICD (automatic cardioverter/defibrillator) present    Allergic rhinitis    Allergy    Anginal pain (HCC) 05/26/14   chest pain after chasing dog   Atypical nevus of back 04/27/2003   moderate - mid lower back   Basal cell carcinoma 01/26/2008   right cheek - MOHs   CAD (coronary artery disease)    hx of stent- 2005 RCA   Cataract    beginning stage both eyes   CHF (congestive heart failure) (HCC)    pacemaker Medtronic     Chronic back pain    intermittent   Constipation    Dizziness    Dysrhythmia    right bundle branch block    Esophageal stricture    GERD (gastroesophageal reflux disease)    Hemoptysis    Hiatal hernia    History of colonic polyps    hyperplastic   HTN (hypertension)    Hyperlipidemia     Hypertrophy of prostate with urinary obstruction and other lower urinary tract symptoms (LUTS)    OA (osteoarthritis)    OSA (obstructive sleep apnea)    cpap- 10    Other specified disorder of stomach and duodenum    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   Pericarditis 07/06/2019   Pneumonia    Pre-diabetes    no medications   Shortness of breath    with exertion   Sleep apnea    cpap   Testicular hypofunction    Past Surgical History:  Procedure Laterality Date   BILIARY STENT PLACEMENT N/A 03/19/2021   Procedure: BILIARY STENT PLACEMENT;  Surgeon: Wilhelmenia Aloha Raddle., MD;  Location: [facility];  Service: Gastroenterology;  Laterality: N/A;   BIV ICD INSERTION CRT-D N/A 03/27/2017   Procedure: BIV ICD INSERTION CRT-D;  Surgeon: Inocencio Soyla Lunger, MD;  Location: [facility] INVASIVE CV LAB;  Service: Cardiovascular;  Laterality: N/A;   CARDIAC CATHETERIZATION     '05, last 2009, showing patent RCA stent   COLONOSCOPY  12/2007   HYPERPLASTIC POLYP   COLONOSCOPY  12/2019   COLONOSCOPY WITH PROPOFOL  N/A 06/02/2014   Procedure: COLONOSCOPY WITH PROPOFOL ;  Surgeon: Toribio SHAUNNA Teressa, MD;  Location: [facility];  Service: Endoscopy;  Laterality: N/A;   CORONARY ANGIOPLASTY  08/2003   ENDOSCOPIC MUCOSAL RESECTION  03/19/2021   Procedure: ENDOSCOPIC MUCOSAL RESECTION, ampullectomy;  Surgeon: Wilhelmenia Aloha Raddle., MD;  Location: [facility];  Service: Gastroenterology;;   ENDOSCOPIC RETROGRADE CHOLANGIOPANCREATOGRAPHY (ERCP) WITH PROPOFOL  N/A 03/19/2021   Procedure: ENDOSCOPIC RETROGRADE CHOLANGIOPANCREATOGRAPHY (ERCP) WITH PROPOFOL ;  Surgeon: Wilhelmenia Aloha Raddle., MD;  Location: [facility];  Service: Gastroenterology;  Laterality: N/A;   ENDOSCOPIC RETROGRADE CHOLANGIOPANCREATOGRAPHY (ERCP) WITH PROPOFOL  N/A 11/29/2021   Procedure: ENDOSCOPIC RETROGRADE CHOLANGIOPANCREATOGRAPHY (ERCP) WITH PROPOFOL ;  Surgeon: Wilhelmenia Aloha Raddle., MD;  Location: [facility]  ENDOSCOPY;  Service: Gastroenterology;  Laterality: N/A;   ESOPHAGOGASTRODUODENOSCOPY (EGD) WITH PROPOFOL  N/A 06/02/2014   Procedure: ESOPHAGOGASTRODUODENOSCOPY (EGD) WITH PROPOFOL ;  Surgeon: Toribio SHAUNNA Cedar, MD;  Location: [facility];  Service: Endoscopy;  Laterality: N/A;   ESOPHAGOGASTRODUODENOSCOPY (EGD) WITH PROPOFOL  N/A 03/19/2021   Procedure: ESOPHAGOGASTRODUODENOSCOPY (EGD) WITH PROPOFOL ;  Surgeon: Wilhelmenia Aloha Raddle., MD;  Location: [facility];  Service: Gastroenterology;  Laterality: N/A;   ESOPHAGOGASTRODUODENOSCOPY (EGD) WITH PROPOFOL  N/A 03/23/2021   Procedure: ESOPHAGOGASTRODUODENOSCOPY (EGD) WITH PROPOFOL ;  Surgeon: Aneita Gwendlyn DASEN, MD;  Location: [facility];  Service: Endoscopy;  Laterality: N/A;   ESOPHAGOGASTRODUODENOSCOPY (EGD) WITH PROPOFOL  N/A 03/13/2023   Procedure: ESOPHAGOGASTRODUODENOSCOPY (EGD) WITH PROPOFOL ;  Surgeon: Wilhelmenia Aloha Raddle., MD;  Location: [facility];  Service: Gastroenterology;  Laterality: N/A;  WITH SIDE SCOPE   hernia surgery x 3     Bilateral Inguinal, Umbicial   INSERTION OF MESH N/A 03/20/2018   Procedure: INSERTION OF MESH;  Surgeon: Ethyl Lenis, MD;  Location: [facility] OR;  Service: General;  Laterality: N/A;   IRRIGATION AND DEBRIDEMENT ABSCESS Left 08/14/2012   Procedure: IRRIGATION AND DEBRIDEMENT LEFT INGUINAL BOIL ;  Surgeon: Arlena LILLETTE Gal, MD;  Location: [facility];  Service: Urology;  Laterality: Left;   JOINT REPLACEMENT Right 2017   KNEE ARTHROPLASTY     LEFT HEART CATH AND CORONARY ANGIOGRAPHY N/A 01/24/2019   Procedure: LEFT HEART CATH AND CORONARY ANGIOGRAPHY;  Surgeon: Swaziland, Peter M, MD;  Location: [facility] INVASIVE CV LAB;  Service: Cardiovascular;  Laterality: N/A;   LEFT HEART CATHETERIZATION WITH CORONARY ANGIOGRAM N/A 05/26/2014   Procedure: LEFT HEART CATHETERIZATION WITH CORONARY ANGIOGRAM;  Surgeon: Ozell JONETTA Fell, MD;  Location: [facility] CATH LAB;  Service: Cardiovascular;  Laterality: N/A;   LUMBAR LAMINECTOMY/DECOMPRESSION  MICRODISCECTOMY Left 09/26/2014   Procedure: Lumbar Laminectomy for resection of synovial cyst  Lumbar five- sacral one left;  Surgeon: Arley Helling, MD;  Location: [facility];  Service: Neurosurgery;  Laterality: Left;   MOLE REMOVAL Left 03/20/2018   Procedure: MOLE REMOVAL;  Surgeon: Ethyl Lenis, MD;  Location: [facility] OR;  Service: General;  Laterality: Left;   Oral surg to removed growth from ?sinus  10/2010   ?Dermoid removed by DrRiggs   PACEMAKER INSERTION  04/03/2017   PANCREATIC STENT PLACEMENT  03/19/2021   Procedure: PANCREATIC STENT PLACEMENT;  Surgeon: Wilhelmenia Aloha Raddle., MD;  Location: [facility];  Service: Gastroenterology;;   POLYPECTOMY     RCA stenting     '05 RCA   REMOVAL OF STONES  11/29/2021   Procedure: REMOVAL OF SLUDGE;  Surgeon: Wilhelmenia Aloha Raddle., MD;  Location: [facility] ENDOSCOPY;  Service: Gastroenterology;;   right toe surgery  Right    Cyst    RIGHT/LEFT HEART CATH AND CORONARY ANGIOGRAPHY N/A 01/15/2017   Procedure: RIGHT/LEFT HEART CATH AND CORONARY ANGIOGRAPHY;  Surgeon: Fell Ozell, MD;  Location: [facility] INVASIVE CV LAB;  Service: Cardiovascular;  Laterality: N/A;   s/p right knee arthroscopy  2005   SPHINCTEROTOMY  03/19/2021   Procedure: SPHINCTEROTOMY;  Surgeon: Mansouraty, Aloha Raddle., MD;  Location:  [facility];  Service: Gastroenterology;;   CLEDA REMOVAL  11/29/2021   Procedure: STENT REMOVAL;  Surgeon: Wilhelmenia Aloha Raddle., MD;  Location: [facility] ENDOSCOPY;  Service: Gastroenterology;;   SUBMUCOSAL LIFTING INJECTION  03/19/2021   Procedure: SUBMUCOSAL LIFTING INJECTION;  Surgeon: Wilhelmenia Aloha Raddle., MD;  Location: [facility];  Service: Gastroenterology;;   thumb surgery Right    TOTAL KNEE ARTHROPLASTY Right 03/12/2016   Procedure: RIGHT TOTAL KNEE ARTHROPLASTY;  Surgeon: Donnice Car, MD;  Location: [facility];  Service: Orthopedics;  Laterality: Right;   TOTAL KNEE ARTHROPLASTY Left 12/10/2021   Procedure: TOTAL KNEE ARTHROPLASTY;  Surgeon:  Melodi Lerner, MD;  Location: [facility];  Service: Orthopedics;  Laterality: Left;   UPPER ESOPHAGEAL ENDOSCOPIC ULTRASOUND (EUS) N/A 03/19/2021   Procedure: UPPER ESOPHAGEAL ENDOSCOPIC ULTRASOUND (EUS);  Surgeon: Wilhelmenia Aloha Raddle., MD;  Location: [facility];  Service: Gastroenterology;  Laterality: N/A;   UPPER GASTROINTESTINAL ENDOSCOPY     VENTRAL HERNIA REPAIR N/A 03/20/2018   Procedure: LAPAROSCOPIC VENTRAL INCISIONAL  HERNIA ERAS PATHWAY;  Surgeon: Ethyl Lenis, MD;  Location: [facility] OR;  Service: General;  Laterality: N/A;     Current Facility-Administered Medications  Medication Dose Route Frequency Provider Last Rate Last Admin   ceFAZolin  (ANCEF ) 2-4 GM/100ML-% IVPB            sodium chloride  0.9 % with gentamicin  (GARAMYCIN ) ADS Med            0.9 %  sodium chloride  infusion   Intravenous Continuous Gelila Well Gladis, MD       ceFAZolin  (ANCEF ) IVPB 2g/100 mL premix  2 g Intravenous On Call Arine Foley, Soyla Gladis, MD       chlorhexidine  (HIBICLENS ) 4 % liquid 4 Application  4 Application Topical Once Hyder Deman Gladis, MD       gentamicin  (GARAMYCIN ) 80 mg in sodium chloride  0.9 % 500 mL irrigation  80 mg Irrigation On Call Carmeron Heady, Soyla Gladis, MD        Allergies:   Sulfonamide derivatives   Social History:  The patient  reports that he quit smoking about 33 years ago. His smoking use included cigarettes. He has never been exposed to tobacco smoke. He has never used smokeless tobacco. He reports that he does not currently use alcohol . He reports that he does not use drugs.   Family History:  The patient's family history includes Arthritis in his brother and daughter; COPD in his brother; Cancer in his brother; Coronary artery disease in his father; Diabetes in his father; Heart disease in his father and mother; Heart failure in his brother; Hypertension in an other family member; Stomach cancer in his paternal grandmother; Sudden death in his father.   GEN: Well nourished,  well developed in no acute distress NECK: No JVD; No carotid bruits CARDIAC: Regular rate and rhythm, no murmurs, rubs, gallops RESPIRATORY:  Clear to auscultation without rales, wheezing or rhonchi  ABDOMEN: Soft, non-tender, non-distended EXTREMITIES:  No edema; No deformity    Recent Labs: 06/10/2023: Pro B Natriuretic peptide (BNP) 55.0; TSH 3.39 12/01/2023: ALT 19 01/07/2024: BUN 15; Creatinine, Ser 0.85; Hemoglobin 14.3; Platelets 252; Potassium 4.5; Sodium 138    Lipid Panel     Component Value Date/Time   CHOL 135 06/10/2023 1408   CHOL 138 11/27/2016 0914   TRIG 345.0 (H) 06/10/2023 1408   HDL 31.60 (L) 06/10/2023 1408   HDL 35 (L) 11/27/2016 0914   CHOLHDL 4 06/10/2023 1408   VLDL 69.0 (H) 06/10/2023 1408   LDLCALC  34 06/10/2023 1408   LDLCALC 65 11/27/2016 0914   LDLDIRECT 75.0 09/27/2021 0815     Wt Readings from Last 3 Encounters:  01/21/24 81.6 kg  01/07/24 84.1 kg  01/07/24 83.7 kg      Other studies Reviewed: Additional studies/ records that were reviewed today include: TTE 06/25/16 Review of the above records today demonstrates:  - Left ventricle: The cavity size was normal. Systolic function was   normal. The estimated ejection fraction was in the range of 55%   to 60%. Wall motion was normal; there were no regional wall   motion abnormalities. Doppler parameters are consistent with   abnormal left ventricular relaxation (grade 1 diastolic   dysfunction). - Ventricular septum: Septal motion showed dyssynergy. These   changes are consistent with a left bundle branch block. - Aortic valve: There was trivial regurgitation. - Atrial septum: There was increased thickness of the septum,   consistent with lipomatous hypertrophy.  LHC/RHC 01/05/17 1. Widely patent coronary arteries with continued patency of the stented segment in the right coronary artery 2. Mild nonobstructive coronary artery disease as detailed with mild stenosis of the proximal to mid LAD  and otherwise minimal luminal irregularities 3. Well compensated right-sided cardiac hemodynamics  ASSESSMENT AND PLAN:  1.  Chronic systolic heart failure: [name] has presented today for surgery, with the diagnosis of ICD ERI.  The various methods of treatment have been discussed with the patient and family. After consideration of risks, benefits and other options for treatment, the patient has consented to  Procedure(s): ICD gen change as a surgical intervention .  Risks include but not limited to bleeding, infection, pneumothorax, perforation, tamponade, vascular damage, renal failure, MI, stroke, death, and lead dislodgement . The patient's history has been reviewed, patient examined, no change in status, stable for surgery.  I have reviewed the patient's chart and labs.  Questions were answered to the patient's satisfaction.    Chrsitopher Wik Inocencio, MD 01/21/2024 2:11 PM

## 2024-01-21 NOTE — Progress Notes (Signed)
 Patient ambulated to the bathroom.Patient was able to void Dressing to left chest clean, dry, and intact. Discharge instructions given to the patient and family member. No concerns voiced.

## 2024-01-22 ENCOUNTER — Encounter (HOSPITAL_COMMUNITY): Payer: Self-pay | Admitting: Cardiology

## 2024-01-22 MED FILL — Midazolam HCl Inj 2 MG/2ML (Base Equivalent): INTRAMUSCULAR | Qty: 2 | Status: AC

## 2024-01-23 ENCOUNTER — Other Ambulatory Visit: Payer: Self-pay | Admitting: Physician Assistant

## 2024-01-23 ENCOUNTER — Other Ambulatory Visit: Payer: Self-pay | Admitting: Cardiovascular Disease

## 2024-01-23 DIAGNOSIS — M1990 Unspecified osteoarthritis, unspecified site: Secondary | ICD-10-CM

## 2024-01-23 NOTE — Telephone Encounter (Signed)
 Last Fill: 12/22/2023  Eye exam: 10/09/2023 WNL    Labs: 01/07/2024 CBC and BMP WNL  Next Visit: 06/08/2024  Last Visit: 01/07/2024  IK:Dzmnendpupcz rheumatoid arthritis (HCC)   Current Dose per office note 01/07/2024: Plaquenil  200 mg p.o. daily Monday to Friday.   Okay to refill Plaquenil ?

## 2024-01-27 ENCOUNTER — Ambulatory Visit (INDEPENDENT_AMBULATORY_CARE_PROVIDER_SITE_OTHER): Admitting: Pulmonary Disease

## 2024-01-27 ENCOUNTER — Ambulatory Visit: Admitting: Pulmonary Disease

## 2024-01-27 VITALS — BP 139/72 | HR 75 | Ht 67.0 in | Wt 185.0 lb

## 2024-01-27 DIAGNOSIS — G4733 Obstructive sleep apnea (adult) (pediatric): Secondary | ICD-10-CM

## 2024-01-27 NOTE — Progress Notes (Signed)
 [name]    [id number]    07/23/1945  Primary Care Physician:Billy, [name], MD  Referring Physician: [name], MD [address] [place],  [place] [address]  Chief complaint:   Patient seen today for obstructive sleep apnea  HPI:  Has been using his CPAP on a nightly basis with no significant concerns Tolerating it well Waking up feeling like he is adequate nights rest  Followed up with ENT recently, was given some inhalers which really has not really helped his nasal stuffiness and congestion  Recently had his pacemaker replaced  Breathing feels relatively good  Does have occasional cough which he feels may be related to postnasal drip  No wheezing  Not having significant issues with his CPAP  Outpatient Encounter Medications as of 01/27/2024  Medication Sig   acetaminophen  (TYLENOL ) 650 MG CR tablet Take 650 mg by mouth in the morning and at bedtime.   atorvastatin  (LIPITOR) 20 MG tablet TAKE 1 TABLET BY MOUTH DAILY   b complex vitamins tablet Take 1 tablet by mouth daily with lunch.   betamethasone dipropionate 0.05 % cream Apply 1 Application topically daily as needed (Dermatitis).   carvedilol  (COREG ) 25 MG tablet TAKE 1 TABLET BY MOUTH TWICE DAILY   cholecalciferol (VITAMIN D3) 25 MCG (1000 UNIT) tablet Take 1,000 Units by mouth daily.   Coenzyme Q10 (CO Q 10 PO) Take 200 mg by mouth daily with lunch.   ELIQUIS  5 MG TABS tablet TAKE 1 TABLET(5 MG) BY MOUTH TWICE DAILY   Flaxseed, Linseed, (FLAXSEED OIL PO) Take 1 capsule by mouth daily. With Omega 3   folic acid  (FOLVITE ) 400 MCG tablet Take 400 mcg by mouth daily.   Glucosamine HCl (GLUCOSAMINE PO) Take 1,500 mg by mouth every evening.   hydrocortisone  2.5 % cream Apply 1 application topically 2 (two) times daily as needed (rash on nose).    hydroxychloroquine  (PLAQUENIL ) 200 MG tablet TAKE 1 TABLET BY MOUTH DAILY ON MONDAY THROUGH FRIDAY AND SKIP SATURDAY AND SUNDAY   Lutein  20 MG TABS Take  20 mg by mouth daily.   Multiple Vitamin (MULTIVITAMIN) capsule Take 1 capsule by mouth daily.     nitroGLYCERIN  (NITROSTAT ) 0.4 MG SL tablet Dissolve 1 tablet under the tongue every 5 minutes as needed for chest pain. Max of 3 doses, then 911.   NON FORMULARY CPAP machine with sleep.   omeprazole  (PRILOSEC) 40 MG capsule TAKE 1 CAPSULE(40 MG) BY MOUTH IN THE MORNING AND AT BEDTIME   pantoprazole  (PROTONIX ) 40 MG tablet Take 1 tablet (40 mg total) by mouth daily. before breakfast   polyethylene glycol (MIRALAX  / GLYCOLAX ) 17 g packet Take 17 g by mouth daily as needed for mild constipation.   Probiotic Product (PROBIOTIC DAILY PO) Take 1 capsule by mouth daily with lunch.    RESTASIS  0.05 % ophthalmic emulsion Place 1 drop into both eyes 2 (two) times daily as needed (dry eyes).   sacubitril -valsartan  (ENTRESTO ) 49-51 MG TAKE 1 TABLET BY MOUTH TWICE DAILY   Saw Palmetto, Serenoa repens, (SAW PALMETTO PO) Take 450 mg by mouth every evening.   spironolactone  (ALDACTONE ) 25 MG tablet Take 1 tablet (25 mg total) by mouth daily.   tamsulosin  (FLOMAX ) 0.4 MG CAPS capsule Take 0.4 mg by mouth daily.   Testosterone  20 % CREA Apply 2 mLs topically daily. Rub on shoulder   tobramycin-dexamethasone  (TOBRADEX) ophthalmic ointment Place 1 Application into both eyes as needed (Irritation).  traMADol  (ULTRAM ) 50 MG tablet Take 1 tablet (50 mg total) by mouth at bedtime as needed. (Patient taking differently: Take 50 mg by mouth at bedtime.)   TURMERIC PO Take 1 tablet by mouth daily with lunch.    vitamin C (ASCORBIC ACID) 500 MG tablet Take 500 mg by mouth daily.   zinc gluconate 50 MG tablet Take 50 mg by mouth daily.   No facility-administered encounter medications on file as of 01/27/2024.    Allergies as of 01/27/2024 - Review Complete 01/21/2024  Allergen Reaction Noted   Sulfonamide derivatives Rash 06/26/2010    Past Medical History:  Diagnosis Date   AICD (automatic cardioverter/defibrillator)  present    Allergic rhinitis    Allergy    Anginal pain (HCC) 05/26/14   chest pain after chasing dog   Atypical nevus of back 04/27/2003   moderate - mid lower back   Basal cell carcinoma 01/26/2008   right cheek - MOHs   CAD (coronary artery disease)    hx of stent- 2005 RCA   Cataract    beginning stage both eyes   CHF (congestive heart failure) (HCC)    pacemaker Medtronic     Chronic back pain    intermittent   Constipation    Dizziness    Dysrhythmia    right bundle branch block    Esophageal stricture    GERD (gastroesophageal reflux disease)    Hemoptysis    Hiatal hernia    History of colonic polyps    hyperplastic   HTN (hypertension)    Hyperlipidemia    Hypertrophy of prostate with urinary obstruction and other lower urinary tract symptoms (LUTS)    OA (osteoarthritis)    OSA (obstructive sleep apnea)    cpap- 10    Other specified disorder of stomach and duodenum    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 Teressa Green   Pericarditis 07/06/2019   Pneumonia    Pre-diabetes    no medications   Shortness of breath    with exertion   Sleep apnea    cpap   Testicular hypofunction     Past Surgical History:  Procedure Laterality Date   BILIARY STENT PLACEMENT N/A 03/19/2021   Procedure: BILIARY STENT PLACEMENT;  Surgeon: [name]., MD;  [facility]: [facility];  Service: Gastroenterology;  Laterality: N/A;   BIV ICD GENERATOR CHANGEOUT N/A 01/21/2024   Procedure: BIV ICD GENERATOR CHANGEOUT;  Surgeon: Billy Billy Lunger, MD;  Location: [facility] INVASIVE CV LAB;  Service: Cardiovascular;  Laterality: N/A;   BIV ICD INSERTION CRT-D N/A 03/27/2017   Procedure: BIV ICD INSERTION CRT-D;  Surgeon: Billy Billy Lunger, MD;  Location: [facility] INVASIVE CV LAB;  Service: Cardiovascular;  Laterality: N/A;   CARDIAC CATHETERIZATION     '05, last 2009, showing patent RCA stent   COLONOSCOPY  12/2007   HYPERPLASTIC POLYP   COLONOSCOPY  12/2019    COLONOSCOPY WITH PROPOFOL  N/A 06/02/2014   Procedure: COLONOSCOPY WITH PROPOFOL ;  Surgeon: Billy Billy Teressa, MD;  Location: [facility];  Service: Green;  Laterality: N/A;   CORONARY ANGIOPLASTY  08/2003   ENDOSCOPIC MUCOSAL RESECTION  03/19/2021   Procedure: ENDOSCOPIC MUCOSAL RESECTION, ampullectomy;  Surgeon: [name]., MD;  Location: [facility];  Service: Gastroenterology;;   ENDOSCOPIC RETROGRADE CHOLANGIOPANCREATOGRAPHY (ERCP) WITH PROPOFOL  N/A 03/19/2021   Procedure: ENDOSCOPIC RETROGRADE CHOLANGIOPANCREATOGRAPHY (ERCP) WITH PROPOFOL ;  Surgeon: [name]., MD;  Location: [facility];  Service: Gastroenterology;  Laterality: N/A;   ENDOSCOPIC RETROGRADE  CHOLANGIOPANCREATOGRAPHY (ERCP) WITH PROPOFOL  N/A 11/29/2021   Procedure: ENDOSCOPIC RETROGRADE CHOLANGIOPANCREATOGRAPHY (ERCP) WITH PROPOFOL ;  Surgeon: [name]., MD;  Location[facility];  Service: Gastroenterology;  Laterality: N/A;   ESOPHAGOGASTRODUODENOSCOPY (EGD) WITH PROPOFOL  N/A 06/02/2014   Procedure: ESOPHAGOGASTRODUODENOSCOPY (EGD) WITH PROPOFOL ;  Surgeon: Billy Billy Cedar, MD;  [facility];  Service: Green;  Laterality: N/A;   ESOPHAGOGASTRODUODENOSCOPY (EGD) WITH PROPOFOL  N/A 03/19/2021   Procedure: ESOPHAGOGASTRODUODENOSCOPY (EGD) WITH PROPOFOL ;  Surgeon: [name]., MD;  Location: [facility];  Service: Gastroenterology;  Laterality: N/A;   ESOPHAGOGASTRODUODENOSCOPY (EGD) WITH PROPOFOL  N/A 03/23/2021   Procedure: ESOPHAGOGASTRODUODENOSCOPY (EGD) WITH PROPOFOL ;  Surgeon: Aneita Gwendlyn DASEN, MD;  [facility];  Service: Green;  Laterality: N/A;   ESOPHAGOGASTRODUODENOSCOPY (EGD) WITH PROPOFOL  N/A 03/13/2023   Procedure: ESOPHAGOGASTRODUODENOSCOPY (EGD) WITH PROPOFOL ;  Surgeon: [name]., MD;  Location: [facility];  Service: Gastroenterology;  Laterality: N/A;  WITH SIDE SCOPE   hernia surgery x 3     Bilateral Inguinal, Umbicial    INSERTION OF MESH N/A 03/20/2018   Procedure: INSERTION OF MESH;  Surgeon: Ethyl Lenis, MD;  Location: [facility] OR;  Service: General;  Laterality: N/A;   IRRIGATION AND DEBRIDEMENT ABSCESS Left 08/14/2012   Procedure: IRRIGATION AND DEBRIDEMENT LEFT INGUINAL BOIL ;  Surgeon: Arlena LILLETTE Gal, MD;  [facility];  Service: Urology;  Laterality: Left;   JOINT REPLACEMENT Right 2017   KNEE ARTHROPLASTY     LEFT HEART CATH AND CORONARY ANGIOGRAPHY N/A 01/24/2019   Procedure: LEFT HEART CATH AND CORONARY ANGIOGRAPHY;  Surgeon: Swaziland, Peter M, MD;  Location: [facility] INVASIVE CV LAB;  Service: Cardiovascular;  Laterality: N/A;   LEFT HEART CATHETERIZATION WITH CORONARY ANGIOGRAM N/A 05/26/2014   Procedure: LEFT HEART CATHETERIZATION WITH CORONARY ANGIOGRAM;  Surgeon: Ozell JONETTA Fell, MD;  Location: [facility] CATH LAB;  Service: Cardiovascular;  Laterality: N/A;   LUMBAR LAMINECTOMY/DECOMPRESSION MICRODISCECTOMY Left 09/26/2014   Procedure: Lumbar Laminectomy for resection of synovial cyst  Lumbar five- sacral one left;  Surgeon: Arley Helling, MD;  [facility];  Service: Neurosurgery;  Laterality: Left;   MOLE REMOVAL Left 03/20/2018   Procedure: MOLE REMOVAL;  Surgeon: Ethyl Lenis, MD;  Location: [facility] OR;  Service: General;  Laterality: Left;   Oral surg to removed growth from ?sinus  10/2010   ?Dermoid removed by DrRiggs   PACEMAKER INSERTION  04/03/2017   PANCREATIC STENT PLACEMENT  03/19/2021   Procedure: PANCREATIC STENT PLACEMENT;  Surgeon: [name]., MD;  Location: [facility];  Service: Gastroenterology;;   POLYPECTOMY     RCA stenting     '05 RCA   REMOVAL OF STONES  11/29/2021   Procedure: REMOVAL OF SLUDGE;  Surgeon: [name]., MD;  Location: [facility];  Service: Gastroenterology;;   right toe surgery  Right    Cyst    RIGHT/LEFT HEART CATH AND CORONARY ANGIOGRAPHY N/A 01/15/2017   Procedure: RIGHT/LEFT HEART CATH AND CORONARY ANGIOGRAPHY;  Surgeon: Fell Ozell, MD;  Location: [facility] INVASIVE CV LAB;  Service: Cardiovascular;  Laterality: N/A;   s/p right knee arthroscopy  2005   SPHINCTEROTOMY  03/19/2021   Procedure: SPHINCTEROTOMY;  Surgeon: Mansouraty, [name]., MD;  [facility]: [facility];  Service: Gastroenterology;;   CLEDA REMOVAL  11/29/2021   Procedure: STENT REMOVAL;  Surgeon: [name]., MD;  Location: [facility];  Service: Gastroenterology;;   SUBMUCOSAL LIFTING INJECTION  03/19/2021   Procedure: SUBMUCOSAL LIFTING INJECTION;  Surgeon: [name]., MD;  [facility];  Service: Gastroenterology;;   thumb surgery Right    TOTAL KNEE ARTHROPLASTY Right 03/12/2016   Procedure: RIGHT TOTAL KNEE ARTHROPLASTY;  Surgeon: Donnice Car, MD;  [facility];  Service: Orthopedics;  Laterality: Right;   TOTAL KNEE ARTHROPLASTY Left 12/10/2021   Procedure: TOTAL KNEE ARTHROPLASTY;  Surgeon: Melodi Lerner, MD;  [facility];  Service: Orthopedics;  Laterality: Left;   UPPER ESOPHAGEAL ENDOSCOPIC ULTRASOUND (EUS) N/A 03/19/2021   Procedure: UPPER ESOPHAGEAL ENDOSCOPIC ULTRASOUND (EUS);  Surgeon: [name]., MD;  [facility]: [facility];  Service: Gastroenterology;  Laterality: N/A;   UPPER GASTROINTESTINAL Green     VENTRAL HERNIA REPAIR N/A 03/20/2018   Procedure: LAPAROSCOPIC VENTRAL INCISIONAL  HERNIA ERAS PATHWAY;  Surgeon: Ethyl Lenis, MD;  Location: [facility] OR;  Service: General;  Laterality: N/A;    Family History  Problem Relation Age of Onset   Heart disease Mother    Coronary artery disease Father    Diabetes Father    Sudden death Father        due to heart disease   Heart disease Father    COPD Brother    Arthritis Brother    Heart failure Brother    Cancer Brother        lung    Stomach cancer Paternal Grandmother    Arthritis Daughter    Hypertension Other    Colon cancer Neg Hx    Esophageal cancer Neg Hx    Colon polyps Neg Hx    Ulcerative colitis Neg Hx     Liver disease Neg Hx    Pancreatic cancer Neg Hx    Inflammatory bowel disease Neg Hx    Rectal cancer Neg Hx     Social History   Socioeconomic History   Marital status: Married    Spouse name: Not on file   Number of children: 1   Years of education: Not on file   Highest education level: Bachelor's degree (e.g., BA, AB, BS)  Occupational History   Occupation: Advertising account planner: [employer]  Tobacco Use   Smoking status: Former    Current packs/day: 0.00    Types: Cigarettes    Quit date: 05/20/1990    Years since quitting: 33.7    Passive exposure: Never   Smokeless tobacco: Never   Tobacco comments:    previous 30 pack year history  Vaping Use   Vaping status: Never Used  Substance and Sexual Activity   Alcohol  use: Not Currently    Comment: rare   Drug use: Never   Sexual activity: Not on file  Other Topics Concern   Not on file  Social History Narrative   Not on file   Social Drivers of Health   Financial Resource Strain: Low Risk  (06/06/2023)   Overall Financial Resource Strain (CARDIA)    Difficulty of Paying Living Expenses: Not hard at all  Food Insecurity: No Food Insecurity (06/06/2023)   Hunger Vital Sign    Worried About Running Out of Food in the Last Year: Never true    Ran Out of Food in the Last Year: Never true  Transportation Needs: No Transportation Needs (06/06/2023)   PRAPARE - Administrator, Civil Service (Medical): No    Lack of Transportation (Non-Medical): No  Physical Activity: Sufficiently Active (06/06/2023)   Exercise Vital Sign    Days of Exercise per Week: 5 days    Minutes of Exercise per Session: 30 min  Stress: No Stress Concern Present (06/06/2023)   [employer] of Occupational Health - Occupational Stress Questionnaire    Feeling of Stress : Only a little  Social Connections: Socially Integrated (06/06/2023)   Social Connection and Isolation Panel    Frequency of Communication with Friends  and Family: More than three times a week    Frequency of Social Gatherings with Friends and Family: Three times a week    Attends Religious Services: More than 4 times per year    Active Member of Clubs or Organizations: Yes    Attends Banker Meetings: More than 4 times per year    Marital Status: Married  Catering manager Violence: Not At Risk (04/02/2023)   Humiliation, Afraid, Rape, and Kick questionnaire    Fear of Current or Ex-Partner: No    Emotionally Abused: No    Physically Abused: No    Sexually Abused: No    Review of Systems  Respiratory:  Positive for apnea and cough. Negative for shortness of breath.   Psychiatric/Behavioral:  Positive for sleep disturbance.     Vitals:   01/27/24 1544  BP: 139/72  Pulse: 75  SpO2: 95%   Physical Exam Constitutional:      Appearance: Normal appearance.  HENT:     Head: Normocephalic.     Mouth/Throat:     Mouth: Mucous membranes are moist.  Eyes:     General: No scleral icterus.    Pupils: Pupils are equal, round, and reactive to light.  Cardiovascular:     Rate and Rhythm: Normal rate and regular rhythm.     Heart sounds: No murmur heard.    No friction rub.  Pulmonary:     Effort: No respiratory distress.     Breath sounds: No stridor. No wheezing or rhonchi.  Musculoskeletal:     Cervical back: No rigidity or tenderness.  Neurological:     Mental Status: He is alert.  Psychiatric:        Mood and Affect: Mood normal.    Data Reviewed: CPAP compliance shows 100% compliance AutoSet 5-15 Residual AHI 1.1  Assessment:   Moderate obstructive sleep apnea - Adequately treated with CPAP therapy  Nasal stuffiness and runny nose - Talked about Astepro  - Nasal steroids have not helped in the past   Plan/Recommendations: Continue CPAP on a nightly basis - New machine in the last couple of months  Procure Astepro  for runny nose to try  No longer using any inhalers  Follow-up a year from  now  Encouraged to give us  a call with any significant concerns   Jennet Epley MD [facility] 01/27/2024, 4:03 PM  CC: Billy [name], MD

## 2024-01-27 NOTE — Patient Instructions (Signed)
 Download from your machine shows it is working well  We do not need to make any changes  I will see you a year from now  Astepro /Astelin  is the name of the antihistamine for your nose-this should help the runny nose  Call with significant concerns otherwise, we will see you a year from now

## 2024-01-29 ENCOUNTER — Other Ambulatory Visit: Payer: Self-pay | Admitting: Physician Assistant

## 2024-01-30 NOTE — Telephone Encounter (Signed)
 Last Fill: 12/22/2023  UDS:01/07/2024 UDS suggestive of tramadol  use.   Narc Agreement: 01/07/2024  Next Visit: 06/08/2024  Last Visit: 01/07/2024  Dx: Seropositive rheumatoid arthritis (HCC)   Current Dose per office note on 01/07/2024: tramadol  50 mg p.o. p.o. nightly as needed pain.    Okay to refill Tramadol ?

## 2024-02-02 ENCOUNTER — Encounter

## 2024-02-03 ENCOUNTER — Ambulatory Visit: Payer: Self-pay | Admitting: Cardiology

## 2024-02-03 ENCOUNTER — Ambulatory Visit: Attending: Cardiovascular Disease

## 2024-02-03 DIAGNOSIS — I428 Other cardiomyopathies: Secondary | ICD-10-CM

## 2024-02-03 LAB — CUP PACEART INCLINIC DEVICE CHECK
Date Time Interrogation Session: 20250916133452
Implantable Lead Connection Status: 753985
Implantable Lead Connection Status: 753985
Implantable Lead Connection Status: 753985
Implantable Lead Implant Date: 20181108
Implantable Lead Implant Date: 20181108
Implantable Lead Implant Date: 20181108
Implantable Lead Location: 753858
Implantable Lead Location: 753859
Implantable Lead Location: 753860
Implantable Lead Model: 4298
Implantable Lead Model: 5076
Implantable Pulse Generator Implant Date: 20250903

## 2024-02-03 NOTE — Patient Instructions (Signed)

## 2024-02-03 NOTE — Progress Notes (Signed)
 Normal MULTI chamber ICD wound check post generator change. Wound well healed. Presenting rhythm: AS/BP 80. Routine testing performed.  Thresholds, sensing, and impedance demonstrate stable parameters and no programming changes needed. No treated arrhythmias. Estimated longevity 6.4 years. Reviewed shock plan.  Pt enrolled in remote follow-up.

## 2024-02-12 ENCOUNTER — Encounter: Payer: Self-pay | Admitting: Pulmonary Disease

## 2024-02-12 ENCOUNTER — Ambulatory Visit: Payer: Self-pay | Admitting: Pulmonary Disease

## 2024-02-12 ENCOUNTER — Ambulatory Visit (INDEPENDENT_AMBULATORY_CARE_PROVIDER_SITE_OTHER): Admitting: Pulmonary Disease

## 2024-02-12 VITALS — BP 135/70 | HR 71 | Temp 98.7°F | Ht 62.0 in | Wt 186.6 lb

## 2024-02-12 DIAGNOSIS — I509 Heart failure, unspecified: Secondary | ICD-10-CM | POA: Diagnosis not present

## 2024-02-12 DIAGNOSIS — J069 Acute upper respiratory infection, unspecified: Secondary | ICD-10-CM | POA: Diagnosis not present

## 2024-02-12 DIAGNOSIS — Z950 Presence of cardiac pacemaker: Secondary | ICD-10-CM

## 2024-02-12 MED ORDER — AZITHROMYCIN 250 MG PO TABS: ORAL_TABLET | ORAL | 0 refills | Status: DC

## 2024-02-12 MED ORDER — PREDNISONE 10 MG PO TABS: ORAL_TABLET | ORAL | 0 refills | Status: AC

## 2024-02-12 MED ORDER — BENZONATATE 200 MG PO CAPS: 200.0000 mg | ORAL_CAPSULE | Freq: Three times a day (TID) | ORAL | 1 refills | Status: DC | PRN

## 2024-02-12 NOTE — Patient Instructions (Addendum)
 I am concerned you have an upper respiratory tract infection  Start Zpak antibiotic, 2 tabs on day 1, then 1 tab daily for 4 days  Start prednisone  taper - 40mg  daily x 3 days - 30mg  daily x 3 days - 20mg  daily x 3 days  - 10mg  daily x 3 days  Use benzonatate  perles as needed  Continue CPAP nightly  Follow up as needed

## 2024-02-12 NOTE — Telephone Encounter (Signed)
 Seen in clinic today.  JD

## 2024-02-12 NOTE — Telephone Encounter (Signed)
 FYI

## 2024-02-12 NOTE — Telephone Encounter (Signed)
 Appointment today 02/12/2024 at 3pm with Dr Kara Fortis CAL to ensure appt today was okay with Dr Kara and advised to see if patient had tried his PCP first. Patient advised that he had not tried his PCP and that Dr Neda advised him to come to Pulmonary if he had any issues involving his lungs.  FYI Only or Action Required?: FYI only for provider.  Patient is followed in Pulmonology for OSA, last seen on 01/27/2024 by Neda Jennet LABOR, MD.  Called Nurse Triage reporting Cough.  Symptoms began 4-5 days .  Interventions attempted: OTC medications: Dayquil, Nyquil, Zinc, Zicam, Vitamin C and Increased fluids/rest.  Symptoms are: gradually worsening.  Triage Disposition: See HCP Within 4 Hours (Or PCP Triage)  Patient/caregiver understands and will follow disposition?: Yes          Copied from CRM #[id number]. Topic: Clinical - Red Word Triage >> Feb 12, 2024  1:06 PM Isabell A wrote: [facility] that prompted transfer to Nurse Triage:  Patient states he developed a cough with runny nose, hes been taking dayquil/nyquil & it has become worse. He is coughing green phlegm & his lungs are painful. Reason for Disposition  [1] MILD difficulty breathing (e.g., minimal/no SOB at rest, SOB with walking, pulse < 100) AND [2] still present when not coughing  Answer Assessment - Initial Assessment Questions Patient had a head cold over the weekend and now has chest congestion/shortness of breath on exertion/productive cough with green phlegm Patient had something similar back in March and was seen for it   Patient advised to call us  with any changes Patient is advised that if anything worsens to go to the Emergency Room. Patient verbalized understanding.   [facility] Triage - Initial Assessment Questions Chief Complaint (e.g., cough, sob, wheezing, fever, chills, sweat or additional symptoms) *Go to specific symptom protocol after initial questions. Productive cough with green  phlegm  How long have symptoms been present? Past 4-5 days  Have you tested for COVID or Flu? Note: If not, ask patient if a home test can be taken. If so, instruct patient to call back for positive results. No  MEDICINES:   Have you used any OTC meds to help with symptoms? Yes If yes, ask What medications? Dayquil Nyquil Delsym Vitamin C Zinc Zicam  Have you used your inhalers/maintenance medication? No If yes, What medications? N/a  If inhaler, ask How many puffs and how often? Note: Review instructions on medication in the chart. N/a  OXYGEN: Do you wear supplemental oxygen? No If yes, How many liters are you supposed to use? N/a  Do you monitor your oxygen levels? No If yes, What is your reading (oxygen level) today? N/a    3. SPUTUM: Describe the color of your sputum (e.g., none, dry cough; clear, white, yellow, green)     no 4. HEMOPTYSIS: Are you coughing up any blood? If Yes, ask: How much? (e.g., flecks, streaks, tablespoons, etc.)     no 5. DIFFICULTY BREATHING: Are you having difficulty breathing? If Yes, ask: How bad is it? (e.g., mild, moderate, severe)      mild 6. FEVER: Do you have a fever? If Yes, ask: What is your temperature, how was it measured, and when did it start?     no 7. CARDIAC HISTORY: Do you have any history of heart disease? (e.g., heart attack, congestive heart failure)       Pacemaker replaced 2-3 weeks ago 8. LUNG HISTORY: Do you have any  history of lung disease?  (e.g., pulmonary embolus, asthma, emphysema)     Sleeps at night with CPAP 9. PE RISK FACTORS: Do you have a history of blood clots? (or: recent major surgery, recent prolonged travel, bedridden)     no 10. OTHER SYMPTOMS: Do you have any other symptoms? (e.g., runny nose, wheezing, chest pain)       Runny nose, productive cough with green phlegm  Protocols used: Cough - Acute Productive-A-AH

## 2024-02-12 NOTE — Progress Notes (Signed)
 Subjective:   PATIENT ID: [name] Kitty GENDER: male DOB: May 01, 1946, MRN: [id number]   HPI Discussed the use of AI scribe software for clinical note transcription with the patient, who gave verbal consent to proceed.  History of Present Illness   AARISH Green is a 78 year old male with congestive heart failure who presents with worsening cough and nasal congestion.  He experiences a worsening cough that began as dry and hacking but is now productive with phlegm, accompanied by significant nasal drainage. Over-the-counter medications, including Dauquil, Nyquil, and zinc, have not alleviated symptoms. Muscle aches are present from coughing, but there is no fever, chills, or sweats.  He recalls being previously prescribed benzonatate  for cough, which was helpful.  He denies asthma or COPD and uses a CPAP machine for OSA.   He has congestive heart failure and recently had a new pacemaker placed three to four weeks ago. His wife had a cold a couple of weeks ago, which he believes he may have contracted. Similar symptoms occurred in March, taking a long time to resolve.      Past Medical History:  Diagnosis Date   AICD (automatic cardioverter/defibrillator) present    Allergic rhinitis    Allergy    Anginal pain 05/26/14   chest pain after chasing dog   Atypical nevus of back 04/27/2003   moderate - mid lower back   Basal cell carcinoma 01/26/2008   right cheek - MOHs   CAD (coronary artery disease)    hx of stent- 2005 RCA   Cataract    beginning stage both eyes   CHF (congestive heart failure) (HCC)    pacemaker Medtronic     Chronic back pain    intermittent   Constipation    Dizziness    Dysrhythmia    right bundle branch block    Esophageal stricture    GERD (gastroesophageal reflux disease)    Hemoptysis    Hiatal hernia    History of colonic polyps    hyperplastic   HTN (hypertension)    Hyperlipidemia    Hypertrophy of prostate with urinary obstruction and  other lower urinary tract symptoms (LUTS)    OA (osteoarthritis)    OSA (obstructive sleep apnea)    cpap- 10    Other specified disorder of stomach and duodenum    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   Pericarditis 07/06/2019   Pneumonia    Pre-diabetes    no medications   Shortness of breath    with exertion   Sleep apnea    cpap   Testicular hypofunction      Family History  Problem Relation Age of Onset   Heart disease Mother    Coronary artery disease Father    Diabetes Father    Sudden death Father        due to heart disease   Heart disease Father    COPD Brother    Arthritis Brother    Heart failure Brother    Cancer Brother        lung    Stomach cancer Paternal Grandmother    Arthritis Daughter    Hypertension Other    Colon cancer Neg Hx    Esophageal cancer Neg Hx    Colon polyps Neg Hx    Ulcerative colitis Neg Hx    Liver disease Neg Hx    Pancreatic cancer Neg Hx    Inflammatory bowel disease Neg Hx  Rectal cancer Neg Hx      Social History   Socioeconomic History   Marital status: Married    Spouse name: Not on file   Number of children: 1   Years of education: Not on file   Highest education level: Bachelor's degree (e.g., BA, AB, BS)  Occupational History   Occupation: Advertising account planner: [employer]  Tobacco Use   Smoking status: Former    Current packs/day: 0.00    Types: Cigarettes    Quit date: 05/20/1990    Years since quitting: 33.7    Passive exposure: Never   Smokeless tobacco: Never   Tobacco comments:    previous 30 pack year history  Vaping Use   Vaping status: Never Used  Substance and Sexual Activity   Alcohol  use: Not Currently    Comment: rare   Drug use: Never   Sexual activity: Not on file  Other Topics Concern   Not on file  Social History Narrative   Not on file   Social Drivers of Health   Financial Resource Strain: Low Risk  (06/06/2023)   Overall  Financial Resource Strain (CARDIA)    Difficulty of Paying Living Expenses: Not hard at all  Food Insecurity: No Food Insecurity (06/06/2023)   Hunger Vital Sign    Worried About Running Out of Food in the Last Year: Never true    Ran Out of Food in the Last Year: Never true  Transportation Needs: No Transportation Needs (06/06/2023)   PRAPARE - Administrator, Civil Service (Medical): No    Lack of Transportation (Non-Medical): No  Physical Activity: Sufficiently Active (06/06/2023)   Exercise Vital Sign    Days of Exercise per Week: 5 days    Minutes of Exercise per Session: 30 min  Stress: No Stress Concern Present (06/06/2023)   [employer] of Occupational Health - Occupational Stress Questionnaire    Feeling of Stress : Only a little  Social Connections: Socially Integrated (06/06/2023)   Social Connection and Isolation Panel    Frequency of Communication with Friends and Family: More than three times a week    Frequency of Social Gatherings with Friends and Family: Three times a week    Attends Religious Services: More than 4 times per year    Active Member of Clubs or Organizations: Yes    Attends Banker Meetings: More than 4 times per year    Marital Status: Married  Catering manager Violence: Not At Risk (04/02/2023)   Humiliation, Afraid, Rape, and Kick questionnaire    Fear of Current or Ex-Partner: No    Emotionally Abused: No    Physically Abused: No    Sexually Abused: No     Allergies  Allergen Reactions   Sulfonamide Derivatives Rash          Outpatient Medications Prior to Visit  Medication Sig Dispense Refill   acetaminophen  (TYLENOL ) 650 MG CR tablet Take 650 mg by mouth in the morning and at bedtime.     atorvastatin  (LIPITOR) 20 MG tablet TAKE 1 TABLET BY MOUTH DAILY 90 tablet 3   b complex vitamins tablet Take 1 tablet by mouth daily with lunch.     betamethasone dipropionate 0.05 % cream Apply 1 Application topically daily  as needed (Dermatitis).     carvedilol  (COREG ) 25 MG tablet TAKE 1 TABLET BY MOUTH TWICE DAILY 180 tablet 1   cholecalciferol (VITAMIN D3) 25 MCG (1000 UNIT) tablet Take  1,000 Units by mouth daily.     Coenzyme Q10 (CO Q 10 PO) Take 200 mg by mouth daily with lunch.     ELIQUIS  5 MG TABS tablet TAKE 1 TABLET(5 MG) BY MOUTH TWICE DAILY 180 tablet 1   Flaxseed, Linseed, (FLAXSEED OIL PO) Take 1 capsule by mouth daily. With Omega 3     folic acid  (FOLVITE ) 400 MCG tablet Take 400 mcg by mouth daily.     Glucosamine HCl (GLUCOSAMINE PO) Take 1,500 mg by mouth every evening.     hydrocortisone  2.5 % cream Apply 1 application topically 2 (two) times daily as needed (rash on nose).      hydroxychloroquine  (PLAQUENIL ) 200 MG tablet TAKE 1 TABLET BY MOUTH DAILY ON MONDAY THROUGH FRIDAY AND SKIP SATURDAY AND SUNDAY 60 tablet 0   Lutein  20 MG TABS Take 20 mg by mouth daily.     Multiple Vitamin (MULTIVITAMIN) capsule Take 1 capsule by mouth daily.       nitroGLYCERIN  (NITROSTAT ) 0.4 MG SL tablet Dissolve 1 tablet under the tongue every 5 minutes as needed for chest pain. Max of 3 doses, then 911. 25 tablet 6   NON FORMULARY CPAP machine with sleep.     omeprazole  (PRILOSEC) 40 MG capsule TAKE 1 CAPSULE(40 MG) BY MOUTH IN THE MORNING AND AT BEDTIME 28 capsule 0   pantoprazole  (PROTONIX ) 40 MG tablet Take 1 tablet (40 mg total) by mouth daily. before breakfast 90 tablet 3   polyethylene glycol (MIRALAX  / GLYCOLAX ) 17 g packet Take 17 g by mouth daily as needed for mild constipation. 14 each 0   Probiotic Product (PROBIOTIC DAILY PO) Take 1 capsule by mouth daily with lunch.      RESTASIS  0.05 % ophthalmic emulsion Place 1 drop into both eyes 2 (two) times daily as needed (dry eyes).     sacubitril -valsartan  (ENTRESTO ) 49-51 MG TAKE 1 TABLET BY MOUTH TWICE DAILY 180 tablet 1   Saw Palmetto, Serenoa repens, (SAW PALMETTO PO) Take 450 mg by mouth every evening.     spironolactone  (ALDACTONE ) 25 MG tablet Take 1  tablet (25 mg total) by mouth daily. 45 tablet 1   tamsulosin  (FLOMAX ) 0.4 MG CAPS capsule Take 0.4 mg by mouth daily.     Testosterone  20 % CREA Apply 2 mLs topically daily. Rub on shoulder     tobramycin-dexamethasone  (TOBRADEX) ophthalmic ointment Place 1 Application into both eyes as needed (Irritation).     traMADol  (ULTRAM ) 50 MG tablet Take 1 tablet (50 mg total) by mouth at bedtime as needed. 30 tablet 0   TURMERIC PO Take 1 tablet by mouth daily with lunch.      vitamin C (ASCORBIC ACID) 500 MG tablet Take 500 mg by mouth daily.     zinc gluconate 50 MG tablet Take 50 mg by mouth daily.     No facility-administered medications prior to visit.    Review of Systems  Constitutional:  Negative for chills, fever, malaise/fatigue and weight loss.  HENT:  Positive for congestion. Negative for sinus pain and sore throat.   Eyes: Negative.   Respiratory:  Positive for cough, shortness of breath and wheezing. Negative for hemoptysis and sputum production.   Cardiovascular:  Negative for chest pain, palpitations, orthopnea, claudication and leg swelling.  Gastrointestinal:  Negative for abdominal pain, heartburn, nausea and vomiting.  Genitourinary: Negative.   Musculoskeletal:  Negative for joint pain and myalgias.  Skin:  Negative for rash.  Neurological:  Negative for weakness.  Endo/Heme/Allergies: Negative.   Psychiatric/Behavioral: Negative.        Objective:   Vitals:   02/12/24 1456  BP: 135/70  Pulse: 71  Temp: 98.7 F (37.1 C)  SpO2: 95%  Weight: 186 lb 9.6 oz (84.6 kg)  Height: 5' 2 (1.575 m)     Physical Exam Constitutional:      General: He is not in acute distress.    Appearance: Normal appearance. He is obese.  Eyes:     General: No scleral icterus.    Conjunctiva/sclera: Conjunctivae normal.  Cardiovascular:     Rate and Rhythm: Normal rate and regular rhythm.  Pulmonary:     Breath sounds: No wheezing, rhonchi or rales.  Musculoskeletal:      Right lower leg: No edema.     Left lower leg: No edema.  Skin:    General: Skin is warm and dry.  Neurological:     General: No focal deficit present.       CBC    Component Value Date/Time   WBC 6.1 01/07/2024 1033   WBC 6.2 12/01/2023 1039   RBC 4.79 01/07/2024 1033   RBC 4.72 12/01/2023 1039   HGB 14.3 01/07/2024 1033   HCT 43.7 01/07/2024 1033   PLT 252 01/07/2024 1033   MCV 91 01/07/2024 1033   MCH 29.9 01/07/2024 1033   MCH 30.1 12/01/2023 1039   MCHC 32.7 01/07/2024 1033   MCHC 32.8 12/01/2023 1039   RDW 12.5 01/07/2024 1033   LYMPHSABS 3.3 06/10/2023 1408   LYMPHSABS 1.8 03/20/2017 1047   MONOABS 1.2 (H) 06/10/2023 1408   EOSABS 136 12/01/2023 1039   EOSABS 0.1 03/20/2017 1047   BASOSABS 37 12/01/2023 1039   BASOSABS 0.0 03/20/2017 1047     Chest imaging:  PFT:     No data to display          Labs:  Path:  Echo:  Heart Catheterization:       Assessment & Plan:   Upper respiratory tract infection, unspecified type - Plan: azithromycin  (ZITHROMAX ) 250 MG tablet, predniSONE  (DELTASONE ) 10 MG tablet, benzonatate  (TESSALON ) 200 MG capsule Assessment and Plan    Acute upper respiratory infection with productive cough Worsening productive cough. No fever or sinus issues. No asthma or COPD history. - Prescribed azithromycin : 2 tablets first day, then 1 tablet daily for 4 days. - Prescribed prednisone  taper: 4 tablets daily for 3 days, then decrease by 1 tablet every 3 days. - Discussed prednisone  side effects: insomnia, increased appetite, fluid retention, swelling. - Prescribed benzonatate  for cough suppression, especially at night. - Advised discontinuation of OTC medications once prescriptions start. - If no improvement in 48 hours or worsening, consider chest x-ray and further workup.  Congestive heart failure New pacemaker placed 3-4 weeks ago. Monitor for fluid retention due to prednisone . - Monitor for fluid retention and swelling due  to prednisone . - Adjust prednisone  taper if side effects are intolerable.      Follow up as scheduled.  Dorn Chill, MD [facility] Office: [phone]   Current Outpatient Medications:    acetaminophen  (TYLENOL ) 650 MG CR tablet, Take 650 mg by mouth in the morning and at bedtime., Disp: , Rfl:    atorvastatin  (LIPITOR) 20 MG tablet, TAKE 1 TABLET BY MOUTH DAILY, Disp: 90 tablet, Rfl: 3   azithromycin  (ZITHROMAX ) 250 MG tablet, Take as directed, Disp: 6 tablet, Rfl: 0   b complex vitamins tablet, Take 1 tablet by mouth daily with lunch.,  Disp: , Rfl:    benzonatate  (TESSALON ) 200 MG capsule, Take 1 capsule (200 mg total) by mouth 3 (three) times daily as needed for cough., Disp: 30 capsule, Rfl: 1   betamethasone dipropionate 0.05 % cream, Apply 1 Application topically daily as needed (Dermatitis)., Disp: , Rfl:    carvedilol  (COREG ) 25 MG tablet, TAKE 1 TABLET BY MOUTH TWICE DAILY, Disp: 180 tablet, Rfl: 1   cholecalciferol (VITAMIN D3) 25 MCG (1000 UNIT) tablet, Take 1,000 Units by mouth daily., Disp: , Rfl:    Coenzyme Q10 (CO Q 10 PO), Take 200 mg by mouth daily with lunch., Disp: , Rfl:    ELIQUIS  5 MG TABS tablet, TAKE 1 TABLET(5 MG) BY MOUTH TWICE DAILY, Disp: 180 tablet, Rfl: 1   Flaxseed, Linseed, (FLAXSEED OIL PO), Take 1 capsule by mouth daily. With Omega 3, Disp: , Rfl:    folic acid  (FOLVITE ) 400 MCG tablet, Take 400 mcg by mouth daily., Disp: , Rfl:    Glucosamine HCl (GLUCOSAMINE PO), Take 1,500 mg by mouth every evening., Disp: , Rfl:    hydrocortisone  2.5 % cream, Apply 1 application topically 2 (two) times daily as needed (rash on nose). , Disp: , Rfl:    hydroxychloroquine  (PLAQUENIL ) 200 MG tablet, TAKE 1 TABLET BY MOUTH DAILY ON MONDAY THROUGH FRIDAY AND SKIP SATURDAY AND SUNDAY, Disp: 60 tablet, Rfl: 0   Lutein  20 MG TABS, Take 20 mg by mouth daily., Disp: , Rfl:    Multiple Vitamin (MULTIVITAMIN) capsule, Take 1 capsule by mouth daily.  ,  Disp: , Rfl:    nitroGLYCERIN  (NITROSTAT ) 0.4 MG SL tablet, Dissolve 1 tablet under the tongue every 5 minutes as needed for chest pain. Max of 3 doses, then 911., Disp: 25 tablet, Rfl: 6   NON FORMULARY, CPAP machine with sleep., Disp: , Rfl:    omeprazole  (PRILOSEC) 40 MG capsule, TAKE 1 CAPSULE(40 MG) BY MOUTH IN THE MORNING AND AT BEDTIME, Disp: 28 capsule, Rfl: 0   pantoprazole  (PROTONIX ) 40 MG tablet, Take 1 tablet (40 mg total) by mouth daily. before breakfast, Disp: 90 tablet, Rfl: 3   polyethylene glycol (MIRALAX  / GLYCOLAX ) 17 g packet, Take 17 g by mouth daily as needed for mild constipation., Disp: 14 each, Rfl: 0   predniSONE  (DELTASONE ) 10 MG tablet, Take 4 tablets (40 mg total) by mouth daily with breakfast for 3 days, THEN 3 tablets (30 mg total) daily with breakfast for 3 days, THEN 2 tablets (20 mg total) daily with breakfast for 3 days, THEN 1 tablet (10 mg total) daily with breakfast for 3 days., Disp: 30 tablet, Rfl: 0   Probiotic Product (PROBIOTIC DAILY PO), Take 1 capsule by mouth daily with lunch. , Disp: , Rfl:    RESTASIS  0.05 % ophthalmic emulsion, Place 1 drop into both eyes 2 (two) times daily as needed (dry eyes)., Disp: , Rfl:    sacubitril -valsartan  (ENTRESTO ) 49-51 MG, TAKE 1 TABLET BY MOUTH TWICE DAILY, Disp: 180 tablet, Rfl: 1   Saw Palmetto, Serenoa repens, (SAW PALMETTO PO), Take 450 mg by mouth every evening., Disp: , Rfl:    spironolactone  (ALDACTONE ) 25 MG tablet, Take 1 tablet (25 mg total) by mouth daily., Disp: 45 tablet, Rfl: 1   tamsulosin  (FLOMAX ) 0.4 MG CAPS capsule, Take 0.4 mg by mouth daily., Disp: , Rfl:    Testosterone  20 % CREA, Apply 2 mLs topically daily. Rub on shoulder, Disp: , Rfl:    tobramycin-dexamethasone  (TOBRADEX) ophthalmic ointment, Place 1 Application  into both eyes as needed (Irritation)., Disp: , Rfl:    traMADol  (ULTRAM ) 50 MG tablet, Take 1 tablet (50 mg total) by mouth at bedtime as needed., Disp: 30 tablet, Rfl: 0   TURMERIC  PO, Take 1 tablet by mouth daily with lunch. , Disp: , Rfl:    vitamin C (ASCORBIC ACID) 500 MG tablet, Take 500 mg by mouth daily., Disp: , Rfl:    zinc gluconate 50 MG tablet, Take 50 mg by mouth daily., Disp: , Rfl:

## 2024-02-17 ENCOUNTER — Ambulatory Visit: Payer: Self-pay | Admitting: Pulmonary Disease

## 2024-02-17 NOTE — Telephone Encounter (Signed)
 FYI Only or Action Required?: Action required by provider: medication refill request.  Patient is followed in Pulmonology for OSA, last seen on 02/12/2024 by Kara Dorn NOVAK, MD.  Called Nurse Triage reporting Cough.  Symptoms began 2 weeks ago.  Interventions attempted: Prescription medications: Zithromax , Prednisone .  Symptoms are: gradually improving.  Triage Disposition: See PCP When Office is Open (Within 3 Days)  Patient/caregiver understands and will follow disposition?: No, wishes to speak with PCP     Copied from CRM #[id number]. Topic: Clinical - Red Word Triage >> Feb 17, 2024 11:48 AM Rilla NOVAK wrote: [facility] that prompted transfer to Nurse Triage: Deep raspy cough, green phelm, shortness of breath, wheezing       Reason for Disposition  Cough has been present for > 3 weeks  Answer Assessment - Initial Assessment Questions Patient seen by Dr. Kara on 9/25 and prescribed Zithromax  and Prednisone  for an URI. Patient reports his symptoms are improved but not resolved and wanted to know if the doctor wanted to prescribe any further medication. Please advise.        1. ONSET: When did the cough begin?      2 weeks 2. SEVERITY: How bad is the cough today?      Moderate  3. SPUTUM: Describe the color of your sputum (e.g., none, dry cough; clear, white, yellow, green)     Clear or light  4. HEMOPTYSIS: Are you coughing up any blood? If Yes, ask: How much? (e.g., flecks, streaks, tablespoons, etc.)     No 5. DIFFICULTY BREATHING: Are you having difficulty breathing? If Yes, ask: How bad is it? (e.g., mild, moderate, severe)      Mild difficulty breathing  6. FEVER: Do you have a fever? If Yes, ask: What is your temperature, how was it measured, and when did it start?     No 7. CARDIAC HISTORY: Do you have any history of heart disease? (e.g., heart attack, congestive heart failure)      Yes 8. LUNG HISTORY: Do you have any history of lung  disease?  (e.g., pulmonary embolus, asthma, emphysema)     Yes 9. PE RISK FACTORS: Do you have a history of blood clots? (or: recent major surgery, recent prolonged travel, bedridden)     No 10. OTHER SYMPTOMS: Do you have any other symptoms? (e.g., runny nose, wheezing, chest pain)       Runny nose  Protocols used: Cough - Acute Productive-A-AH

## 2024-02-18 ENCOUNTER — Ambulatory Visit: Payer: Self-pay

## 2024-02-18 NOTE — Telephone Encounter (Signed)
  FYI Only or Action Required?: FYI only for provider.  Patient was last seen in primary care on 06/10/2023 by [name], MD.  Called Nurse Triage reporting Shortness of Breath.  Symptoms began x 3 weeks ago.  Interventions attempted: Prescription medications: prednisone , z-pak, tessalon  pearls.  Symptoms are: gradually worsening.  Triage Disposition: See PCP When Office is Open (Within 3 Days)  Patient/caregiver understands and will follow disposition?: Yes  Copied from CRM #[id number]. Topic: Clinical - Red Word Triage >> Feb 18, 2024  2:28 PM Rachelle R wrote: [facility] that prompted transfer to Nurse Triage: Deep raspy cough, green phelm, shortness of breath, wheezing Was triaged by Pulmonology yesterday but advised needed to see his pcp Reason for Disposition  [1] MODERATE longstanding difficulty breathing (e.g., speaks in phrases, SOB even at rest, pulse 100-120) AND [2] SAME as normal  Answer Assessment - Initial Assessment Questions 1. RESPIRATORY STATUS: Describe your breathing? (e.g., wheezing, shortness of breath, unable to speak, severe coughing)      SOB w/exertion, coughing 2. ONSET: When did this breathing problem begin?      3 weeks 3. PATTERN Does the difficult breathing come and go, or has it been constant since it started?      constant 4. SEVERITY: How bad is your breathing? (e.g., mild, moderate, severe)       Moderate 5. RECURRENT SYMPTOM: Have you had difficulty breathing before? If Yes, ask: When was the last time? and What happened that time?      no 6. CARDIAC HISTORY: Do you have any history of heart disease? (e.g., heart attack, angina, bypass surgery, angioplasty)      CHF, pacemaker 7. LUNG HISTORY: Do you have any history of lung disease?  (e.g., pulmonary embolus, asthma, emphysema) na 8. CAUSE: What do you think is causing the breathing problem?      Cold s/s 9. OTHER SYMPTOMS: Do you have any other symptoms? (e.g., chest  pain, cough, dizziness, fever, runny nose)     Cough - green, wheezing , runny nose, raspy cough 10. O2 SATURATION MONITOR:  Do you use an oxygen saturation monitor (pulse oximeter) at home? If Yes, ask: What is your reading (oxygen level) today? What is your usual oxygen saturation reading? (e.g., 95%)       na 11. PREGNANCY: Is there any chance you are pregnant? When was your last menstrual period?       na 12. TRAVEL: Have you traveled out of the country in the last month? (e.g., travel history, exposures)       na  Pt stated he saw his pulmonary doctor last week and was prescribed medications to include: prednisone , zpak, tessalon  pearls with no relief.  Protocols used: Breathing Difficulty-A-AH

## 2024-02-19 ENCOUNTER — Telehealth: Payer: Self-pay | Admitting: Physician Assistant

## 2024-02-19 ENCOUNTER — Ambulatory Visit (INDEPENDENT_AMBULATORY_CARE_PROVIDER_SITE_OTHER): Admitting: Family Medicine

## 2024-02-19 ENCOUNTER — Encounter: Payer: Self-pay | Admitting: Family Medicine

## 2024-02-19 VITALS — BP 160/80 | HR 77 | Temp 98.7°F | Ht 67.0 in | Wt 186.6 lb

## 2024-02-19 DIAGNOSIS — B9689 Other specified bacterial agents as the cause of diseases classified elsewhere: Secondary | ICD-10-CM

## 2024-02-19 DIAGNOSIS — J069 Acute upper respiratory infection, unspecified: Secondary | ICD-10-CM

## 2024-02-19 DIAGNOSIS — Z950 Presence of cardiac pacemaker: Secondary | ICD-10-CM

## 2024-02-19 DIAGNOSIS — R051 Acute cough: Secondary | ICD-10-CM

## 2024-02-19 MED ORDER — AMOXICILLIN-POT CLAVULANATE 875-125 MG PO TABS: 1.0000 | ORAL_TABLET | Freq: Two times a day (BID) | ORAL | 0 refills | Status: DC

## 2024-02-19 NOTE — Progress Notes (Signed)
 Acute Office Visit  Subjective:     Patient ID: [name], male    DOB: 01/01/1946, 78 y.o.   MRN: [id number]  Chief Complaint  Patient presents with   Acute Visit    Cold symptoms going on for 3 weeks. Deep cough, green/yellow mucus, runny nose and sneezing. Was seen by Pulmonology, no relief    HPI  Discussed the use of AI scribe software for clinical note transcription with the patient, who gave verbal consent to proceed.  History of Present Illness [name] is a 78 year old male who presents with a persistent cough, fatigue and nasal congestion.  Cough and upper respiratory symptoms - Persistent, deep, productive cough with clear or light green phlegm for three weeks - Cough is constant - Continuous nasal discharge and congestion - No headaches, fevers, or upset stomach - No relief with over-the-counter medications including NyQuil, DayQuil, cough drops, zinc, and vitamins  Recent interventions and response to treatment - Treated with a Z-Pak and a course of steroids after pacemaker placement five to six weeks ago, with some improvement but persistent symptoms - Currently taking a course of steroids with a few days remaining - Prefers simpler treatment regimens  Recurrent upper respiratory symptoms - Similar episode in March, evaluated by ENT and treated with multiple medications  Cardiac history and medication considerations - Recent pacemaker placement five to six weeks ago - Heart problems considered when selecting medication options     ROS Per HPI      Objective:    BP (!) 160/80 (BP Location: Left Arm, Patient Position: Sitting)   Pulse 77   Temp 98.7 F (37.1 C) (Temporal)   Ht 5' 7 (1.702 m)   Wt 186 lb 9.6 oz (84.6 kg)   SpO2 94%   BMI 29.23 kg/m    Physical Exam Vitals and nursing note reviewed.  Constitutional:      General: He is not in acute distress.    Appearance: He is ill-appearing.  HENT:     Head: Normocephalic and  atraumatic.     Right Ear: External ear normal.     Left Ear: External ear normal.     Nose: Congestion present.     Mouth/Throat:     Mouth: Mucous membranes are moist.     Comments: Oropharyngeal cobblestoning   Eyes:     Extraocular Movements: Extraocular movements intact.  Cardiovascular:     Rate and Rhythm: Normal rate and regular rhythm.     Pulses: Normal pulses.     Heart sounds: Normal heart sounds.  Pulmonary:     Effort: Pulmonary effort is normal. No respiratory distress.     Breath sounds: Normal breath sounds. No wheezing, rhonchi or rales.  Musculoskeletal:        General: Normal range of motion.     Cervical back: Normal range of motion.     Right lower leg: No edema.     Left lower leg: No edema.  Lymphadenopathy:     Cervical: Cervical adenopathy present.  Skin:    General: Skin is warm and dry.  Neurological:     General: No focal deficit present.     Mental Status: He is alert and oriented to person, place, and time.  Psychiatric:        Mood and Affect: Mood normal.        Behavior: Behavior normal.     No results found for any visits on 02/19/24.  Assessment & Plan:   Assessment and Plan Assessment & Plan Bacterial URI with acute cough Persistent cough with clear to light green phlegm despite previous azithromycin  and steroids. No fever or headaches. Stronger antibiotic considered due to symptom persistence. Heart condition affects cough medication choice. - Prescribe stronger antibiotic BID for 7 days. - Continue steroids for a few more days. - Recommend plain guaifenesin, avoid cough suppressant pearls unless needed for sleep. - Advise against Robitussin DM due to heart condition. - Discussed OTC options like NightQuil and DayQuil for temporary relief.  Cardiac pacemaker in situ Pacemaker placed 5-6 weeks ago. Heart condition considered in cough medication selection.     No orders of the defined types were placed in this  encounter.    Meds ordered this encounter  Medications   amoxicillin -clavulanate (AUGMENTIN ) 875-125 MG tablet    Sig: Take 1 tablet by mouth 2 (two) times daily for 7 days.    Dispense:  14 tablet    Refill:  0    Return if symptoms worsen or fail to improve.  Corean LITTIE Ku, FNP

## 2024-02-19 NOTE — Telephone Encounter (Signed)
   The patient called the answering service after-hours today apologetic about med mix up.  He accidentally took his PM carvedilol , Entresto , and Eliquis  twice tonight - once about two hours ago then again just a short while ago. Given the current doses and his age I advised safest thing is to proceed to ED for monitoring as he denied having a way to check his BP at home. He declines to go to ER. He saw primary care today for URI at which time BP was actually elevated which is an outlier for him, usually 120s at home. He states he otherwise feels fine. I told him since he declined to go to ER, unfortunately have to err on the safe side - would hold AM doses of carvedilol  and Entresto , hold spironolactone  for tomorrow, then only resume carvedilol  and Entresto  tomorrow evening of if he was not experiencing low blood pressure, and resume spironolactone  on Saturday as well if no low blood pressure issues. He promised to have someone help check his BP tomorrow, and to call back/seek care if there were any persistent concerns. I discussed Eliquis  with inpatient pharm team who recommended that he only take 1 dose of Eliquis  mid day tomorrow then resume normal BID dosing on Saturday.  We also discussed that without monitoring he may experience the issues that go along with elevated blood pressure in the other direction, so ER precautions reinforced. The patient verbalized understanding and gratitude.  Darlette Dubow N Taran Hable, PA-C

## 2024-02-19 NOTE — Patient Instructions (Signed)
 I have sent in Augmentin  for you to take twice a day for 7 days. This medication can upset your stomach, so I tell everyone to take it with a meal.  Recommend plain robitussin, finish steroids.   May continue tessalon  perles as needed.  Follow-up with me for new or worsening symptoms.

## 2024-02-20 NOTE — Telephone Encounter (Signed)
 Pt saw pcp yesteday and was treated with abx Augmentin  and recommended to complete steroids and cough syrup. NFN

## 2024-02-22 DIAGNOSIS — Z950 Presence of cardiac pacemaker: Secondary | ICD-10-CM | POA: Insufficient documentation

## 2024-02-22 DIAGNOSIS — B9689 Other specified bacterial agents as the cause of diseases classified elsewhere: Secondary | ICD-10-CM | POA: Insufficient documentation

## 2024-02-23 LAB — HM DEXA SCAN

## 2024-02-23 NOTE — Telephone Encounter (Signed)
 Copied from CRM [id number]. Topic: General - Other >> Feb 18, 2024 11:12 AM Essie A wrote: Reason for CRM: Patient called yesterday and is waiting for a response from the doctor or nurse.  He called again because he didn't get a call yesterday.  Please return his call at  [phone].  Thanks.  ----------------------------------------------------------------------- From previous Reason for Contact - Other: Reason for   See previous note below.

## 2024-02-25 ENCOUNTER — Telehealth: Payer: Self-pay

## 2024-02-25 ENCOUNTER — Ambulatory Visit: Payer: Self-pay

## 2024-02-25 NOTE — Telephone Encounter (Signed)
 Received DEXA results from [facility].  Date of Scan: 02/23/2024  Lowest T-score: -1.7  BMD: 0.697  Lowest site measured: right femoral neck   DX: osteopenia   Significant changes in BMD and site measured (5% and above): -6% change in right total femur  Current Regimen: vitamin D   Recommendation: [place], will discuss at the follow up visit.   Reviewed by: Dr. Dolphus   Next Appointment:  06/08/2024   Attempted to contact patient and left message to advise patient that results were received and Dr. Dolphus would discuss in detail at the follow up visit.

## 2024-02-25 NOTE — Telephone Encounter (Signed)
 FYI Only or Action Required?: FYI only for provider.  Patient was last seen in primary care on 02/19/2024 by Billy Corean CROME, FNP.  Called Nurse Triage reporting Cough.  Symptoms began about a month ago.  Interventions attempted: Prescription medications: Antibiotics, Tessalon  perles.  Symptoms are: gradually worsening.  Triage Disposition: See Physician Within 24 Hours  Patient/caregiver understands and will follow disposition?: Yes     Copied from CRM [id number]. Topic: Clinical - Red Word Triage >> Feb 25, 2024  8:22 AM Anairis L wrote: Red Word that prompted transfer to Nurse Triage: Pt was seen Oct 02, for cough, runny nose and mucous.Has gotten worse after second round of antibiotics. Reason for Disposition  [1] Continuous (nonstop) coughing interferes with work or school AND [2] no improvement using cough treatment per Care Advice  Answer Assessment - Initial Assessment Questions Patient currently on antibiotics for symptoms. He states he has ran out of tessalon  to take for the cough. Symptoms are not improving.   1. ONSET: When did the cough begin?      3-4 weeks  2. SEVERITY: How bad is the cough today?      Moderate to severe  3. SPUTUM: Describe the color of your sputum (e.g., none, dry cough; clear, white, yellow, green)     Dark green, light green and clear  4. HEMOPTYSIS: Are you coughing up any blood? If Yes, ask: How much? (e.g., flecks, streaks, tablespoons, etc.)     Denies  5. DIFFICULTY BREATHING: Are you having difficulty breathing? If Yes, ask: How bad is it? (e.g., mild, moderate, severe)      Mild to moderate about a 3-4 out of 10  6. FEVER: Do you have a fever? If Yes, ask: What is your temperature, how was it measured, and when did it start?     Unsure but states he doesn't not think he has one  7. CARDIAC HISTORY: Do you have any history of heart disease? (e.g., heart attack, congestive heart failure)      CHF  8. LUNG  HISTORY: Do you have any history of lung disease?  (e.g., pulmonary embolus, asthma, emphysema)     Denies  9. PE RISK FACTORS: Do you have a history of blood clots? (or: recent major surgery, recent prolonged travel, bedridden)     Denies  10. OTHER SYMPTOMS: Do you have any other symptoms? (e.g., runny nose, wheezing, chest pain)       Runny nose and wheezing  Protocols used: Cough - Acute Productive-A-AH

## 2024-02-25 NOTE — Telephone Encounter (Signed)
Pt has been scheduled for OV

## 2024-02-26 ENCOUNTER — Ambulatory Visit: Payer: Self-pay | Admitting: Nurse Practitioner

## 2024-02-26 ENCOUNTER — Ambulatory Visit (INDEPENDENT_AMBULATORY_CARE_PROVIDER_SITE_OTHER): Admitting: Nurse Practitioner

## 2024-02-26 ENCOUNTER — Ambulatory Visit (INDEPENDENT_AMBULATORY_CARE_PROVIDER_SITE_OTHER)

## 2024-02-26 VITALS — BP 106/64 | HR 74 | Temp 98.3°F | Ht 67.0 in | Wt 183.5 lb

## 2024-02-26 DIAGNOSIS — R051 Acute cough: Secondary | ICD-10-CM

## 2024-02-26 DIAGNOSIS — J069 Acute upper respiratory infection, unspecified: Secondary | ICD-10-CM

## 2024-02-26 MED ORDER — BENZONATATE 200 MG PO CAPS: 200.0000 mg | ORAL_CAPSULE | Freq: Three times a day (TID) | ORAL | 1 refills | Status: DC | PRN

## 2024-02-26 MED ORDER — DOXYCYCLINE HYCLATE 100 MG PO TABS: 100.0000 mg | ORAL_TABLET | Freq: Two times a day (BID) | ORAL | 0 refills | Status: DC

## 2024-02-26 MED ORDER — ALBUTEROL SULFATE HFA 108 (90 BASE) MCG/ACT IN AERS: 2.0000 | INHALATION_SPRAY | Freq: Four times a day (QID) | RESPIRATORY_TRACT | 0 refills | Status: AC | PRN

## 2024-02-26 NOTE — Assessment & Plan Note (Signed)
 Post-viral cough with wheezing  Persistent cough for four weeks post-viral infection with wheezing. No evidence of pneumonia on exam. Discussed chronic cough potential if symptoms persist beyond 6-8 weeks. Explained risks of unnecessary antibiotics.  - Order stat chest x-ray to rule out other causes. - Prescribe albuterol  inhaler for wheezing. - Advise continuation of guaifenesin for expectoration. Continue tessalon  perles for cough suppression -Discussed another round of prednisone , patient declined - Prescribe doxycycline  if symptoms worsen. - Print prescriptions for him to fill at his convenience.

## 2024-02-26 NOTE — Progress Notes (Signed)
 Established Patient Office Visit  Subjective   Patient ID: KAIO KUHLMAN, male    DOB: 22-Feb-1946  Age: 78 y.o. MRN: [id number]  Chief Complaint  Patient presents with   Cough    Been coughing for 4 weeks, was seen for this twice, by different provider and his pulmonologist    Discussed the use of AI scribe software for clinical note transcription with the patient, who gave verbal consent to proceed.  History of Present Illness PHUONG MOFFATT is a 78 year old male with congestive heart failure who presents with a persistent cough.    Cough and sputum production - Persistent cough for approximately one month, beginning after a head cold - Cough is sometimes productive with a rattling sensation - Deep breaths trigger coughing - Cough is similar to an episode in March that lasted over thirty days - No improvement with over-the-counter medications, Z-Pak, Tessalon  Perles, or prednisone  - Currently on the last day of Augmentin ; taking Robitussin  - Cough remains persistent despite treatments   Gastrointestinal symptoms - Loose stools present, attributed to antibiotic use - Concern for potential risk of C. Diff due to recent antibiotics   Tobacco use history - Smoked one pack per day for thirty years - Quit smoking forty years ago      Review of Systems  Respiratory:  Positive for cough and sputum production.       Objective:     BP 106/64   Pulse 74   Temp 98.3 F (36.8 C) (Temporal)   Ht 5' 7 (1.702 m)   Wt 183 lb 8 oz (83.2 kg)   SpO2 99%   BMI 28.74 kg/m  BP Readings from Last 3 Encounters:  02/26/24 106/64  02/19/24 (!) 160/80  02/12/24 135/70   Wt Readings from Last 3 Encounters:  02/26/24 183 lb 8 oz (83.2 kg)  02/19/24 186 lb 9.6 oz (84.6 kg)  02/12/24 186 lb 9.6 oz (84.6 kg)      Physical Exam Vitals reviewed.  Constitutional:      Appearance: Normal appearance.  HENT:     Head: Normocephalic and atraumatic.  Cardiovascular:      Rate and Rhythm: Normal rate and regular rhythm.  Pulmonary:     Effort: Pulmonary effort is normal.     Breath sounds: Wheezing present.  Musculoskeletal:     Cervical back: Neck supple.  Skin:    General: Skin is warm and dry.  Neurological:     Mental Status: He is alert and oriented to person, place, and time.  Psychiatric:        Mood and Affect: Mood normal.        Behavior: Behavior normal.        Thought Content: Thought content normal.        Judgment: Judgment normal.      No results found for any visits on 02/26/24.    The ASCVD Risk score (Arnett DK, et al., 2019) failed to calculate for the following reasons:   Risk score cannot be calculated because patient has a medical history suggesting prior/existing ASCVD    Assessment & Plan:   Problem List Items Addressed This Visit       Other   Cough - Primary   Post-viral cough with wheezing  Persistent cough for four weeks post-viral infection with wheezing. No evidence of pneumonia on exam. Discussed chronic cough potential if symptoms persist beyond 6-8 weeks. Explained risks of unnecessary antibiotics.  - Order stat  chest x-ray to rule out other causes. - Prescribe albuterol  inhaler for wheezing. - Advise continuation of guaifenesin for expectoration. Continue tessalon  perles for cough suppression -Discussed another round of prednisone , patient declined - Prescribe doxycycline  if symptoms worsen. - Print prescriptions for him to fill at his convenience.      Relevant Medications   doxycycline  (VIBRA -TABS) 100 MG tablet   albuterol  (VENTOLIN  HFA) 108 (90 Base) MCG/ACT inhaler   Other Relevant Orders   DG Chest 2 View   Other Visit Diagnoses       Upper respiratory tract infection, unspecified type       Relevant Medications   benzonatate  (TESSALON ) 200 MG capsule     Assessment and Plan Assessment & Plan Post-viral cough with wheezing  Persistent cough for four weeks post-viral infection with  wheezing. No evidence of pneumonia on exam. Discussed chronic cough potential if symptoms persist beyond 6-8 weeks. Explained risks of unnecessary antibiotics.  - Order stat chest x-ray to rule out other causes. - Prescribe albuterol  inhaler for wheezing. - Advise continuation of guaifenesin for expectoration. Continue tessalon  perles for cough suppression -Discussed another round of prednisone , patient declined - Prescribe doxycycline  if symptoms worsen. - Print prescriptions for him to fill at his convenience.    Return if symptoms worsen or fail to improve.    Lauraine FORBES Pereyra, NP

## 2024-03-01 ENCOUNTER — Ambulatory Visit: Attending: Cardiology

## 2024-03-01 DIAGNOSIS — I5022 Chronic systolic (congestive) heart failure: Secondary | ICD-10-CM | POA: Diagnosis not present

## 2024-03-01 DIAGNOSIS — Z95810 Presence of automatic (implantable) cardiac defibrillator: Secondary | ICD-10-CM

## 2024-03-03 ENCOUNTER — Other Ambulatory Visit: Payer: Self-pay | Admitting: Cardiovascular Disease

## 2024-03-03 ENCOUNTER — Other Ambulatory Visit: Payer: Self-pay | Admitting: Rheumatology

## 2024-03-03 ENCOUNTER — Encounter

## 2024-03-04 NOTE — Telephone Encounter (Signed)
 Last Fill: 01/30/2024   UDS:01/07/2024 UDS suggestive of tramadol  use.    Narc Agreement: 01/07/2024   Next Visit: 06/08/2024   Last Visit: 01/07/2024   Dx: Seropositive rheumatoid arthritis (HCC)    Current Dose per office note on 01/07/2024: tramadol  50 mg p.o. p.o. nightly as needed pain.      Okay to refill Tramadol ?

## 2024-03-05 ENCOUNTER — Ambulatory Visit (INDEPENDENT_AMBULATORY_CARE_PROVIDER_SITE_OTHER)

## 2024-03-05 DIAGNOSIS — I428 Other cardiomyopathies: Secondary | ICD-10-CM | POA: Diagnosis not present

## 2024-03-05 NOTE — Progress Notes (Signed)
 EPIC Encounter for ICM Monitoring  Patient Name: [name] is a 78 y.o. male Date: 03/05/2024 Primary Care Physican: [name], MD Primary Cardiologist: Wonda Electrophysiologist: Inocencio Bi-V Pacing:   97.2%   07/14/2023 Office Weight: 187 lbs 09/24/2023 Office Weight: 187 lbs 02/26/2024 Office Weight: 183.8 lbs 03/05/2024 Weight: 185 lbs   Time in AT/AF  0.0 hr/day (0.0%)                                                          Spoke with patient and heart failure questions reviewed.  Transmission results reviewed.  Pt asymptomatic for fluid accumulation.  Reports feeling well at this time and voices no complaints.     Diet:  Does eat restaurant foods.     Since 01/21/2024 (battery replacement date) Optivol thoracic impedance suggesting normal fluid levels within the last month.   Prescribed: Spironolactone  25 mg take 1 tablet by mouth daily.   Labs: 01/07/2024 Creatinine 0.85, BUN 15, Potassium 4.5, Sodium 138, GFR 88 12/01/2023 Creatinine 0.74, BUN 13, Potassium 4.3, Sodium 140, GFR 92 06/10/2023 Creatinine 0.89, BUN 23, Potassium 3.8, Sodium 139, GFR 81.65 A complete set of results can be found in Results Review.   Recommendations:  No changes and encouraged to call if experiencing any fluid symptoms.   Follow-up plan: ICM clinic phone appointment on 04/05/2024.   91 day device clinic remote transmission 06/04/2024.       EP/Cardiology Office Visits:  04/23/2024 with Dr Inocencio.      Copy of ICM check sent to Dr. Inocencio.     Remote monitoring is medically necessary for Heart Failure Management.    Daily Thoracic Impedance ICM trend: 01/21/2024 through 03/04/2024.    12-14 Month Thoracic Impedance ICM trend:     Mitzie GORMAN Garner, RN 03/05/2024 9:06 AM

## 2024-03-09 LAB — CUP PACEART REMOTE DEVICE CHECK
Battery Remaining Longevity: 76 mo
Battery Voltage: 3.02 V
Brady Statistic RV Percent Paced: 35.56 %
Date Time Interrogation Session: 20251016230742
HighPow Impedance: 60 Ohm
Implantable Lead Connection Status: 753985
Implantable Lead Connection Status: 753985
Implantable Lead Connection Status: 753985
Implantable Lead Implant Date: 20181108
Implantable Lead Implant Date: 20181108
Implantable Lead Implant Date: 20181108
Implantable Lead Location: 753858
Implantable Lead Location: 753859
Implantable Lead Location: 753860
Implantable Lead Model: 4298
Implantable Lead Model: 5076
Implantable Pulse Generator Implant Date: 20250903
Lead Channel Impedance Value: 1178 Ohm
Lead Channel Impedance Value: 1254 Ohm
Lead Channel Impedance Value: 1349 Ohm
Lead Channel Impedance Value: 361 Ohm
Lead Channel Impedance Value: 418 Ohm
Lead Channel Impedance Value: 437 Ohm
Lead Channel Impedance Value: 532 Ohm
Lead Channel Impedance Value: 589 Ohm
Lead Channel Impedance Value: 627 Ohm
Lead Channel Impedance Value: 684 Ohm
Lead Channel Impedance Value: 722 Ohm
Lead Channel Impedance Value: 798 Ohm
Lead Channel Impedance Value: 874 Ohm
Lead Channel Pacing Threshold Amplitude: 0.5 V
Lead Channel Pacing Threshold Amplitude: 0.625 V
Lead Channel Pacing Threshold Amplitude: 2.75 V
Lead Channel Pacing Threshold Pulse Width: 0.4 ms
Lead Channel Pacing Threshold Pulse Width: 0.4 ms
Lead Channel Pacing Threshold Pulse Width: 1 ms
Lead Channel Sensing Intrinsic Amplitude: 11.4 mV
Lead Channel Sensing Intrinsic Amplitude: 2.9 mV
Lead Channel Setting Pacing Amplitude: 1.5 V
Lead Channel Setting Pacing Amplitude: 2 V
Lead Channel Setting Pacing Amplitude: 3.5 V
Lead Channel Setting Pacing Pulse Width: 0.4 ms
Lead Channel Setting Pacing Pulse Width: 1 ms
Lead Channel Setting Sensing Sensitivity: 0.3 mV
Zone Setting Status: 755011
Zone Setting Status: 755011
Zone Setting Status: 755011

## 2024-03-11 NOTE — Progress Notes (Signed)
 Remote ICD Transmission

## 2024-03-16 ENCOUNTER — Ambulatory Visit: Payer: Self-pay | Admitting: Cardiology

## 2024-03-22 ENCOUNTER — Telehealth: Payer: Self-pay

## 2024-03-22 ENCOUNTER — Other Ambulatory Visit: Payer: Self-pay | Admitting: Rheumatology

## 2024-03-22 DIAGNOSIS — M1380 Other specified arthritis, unspecified site: Secondary | ICD-10-CM

## 2024-03-22 NOTE — Telephone Encounter (Signed)
 Last Fill: 01/23/2024  Eye exam: 10/09/2023   Labs: 01/07/2024 BMP and CBC WNL  12/01/2023 CBC and CMP are normal. Glucose is mildly elevated probably not a fasting sample.   Next Visit: 06/08/2024  Last Visit: 01/07/2024  IK:Dzmnendpupcz rheumatoid arthritis   Current Dose per office note on 01/07/2024: Plaquenil  200 mg p.o. daily Monday to Friday.   Okay to refill Plaquenil ?

## 2024-03-22 NOTE — Telephone Encounter (Signed)
 Patient left a voicemail requesting a refill of tramadol . Tramadol  was sent to the pharmacy on 03/04/2024 and filled on 03/04/2024 according to the dispense history. I called the patient and left  a message advising it is too soon to refill the tramadol .  Also advised the patient that a refill of plaquenil  was sent to the pharmacy today as we had received a request from the pharmacy.

## 2024-03-25 ENCOUNTER — Telehealth: Payer: Self-pay | Admitting: Cardiovascular Disease

## 2024-03-25 DIAGNOSIS — I480 Paroxysmal atrial fibrillation: Secondary | ICD-10-CM

## 2024-03-25 MED ORDER — APIXABAN 5 MG PO TABS: 5.0000 mg | ORAL_TABLET | Freq: Two times a day (BID) | ORAL | 5 refills | Status: DC

## 2024-03-25 NOTE — Telephone Encounter (Signed)
*  STAT* If patient is at the pharmacy, call can be transferred to refill team.   1. Which medications need to be refilled? (please list name of each medication and dose if known)   ELIQUIS  5 MG TABS tablet     2. Would you like to learn more about the convenience, safety, & potential cost savings by using the [facility]? No      3. Are you open to using the [facility] (Type [facility]. No    4. Which pharmacy/location (including street and city if local pharmacy) is medication to be sent to? [id number] - [place], [place] - [address] AT [facility] [address]    5. Do they need a 30 day or 90 day supply? 30 day   Pt is out of medication.

## 2024-03-25 NOTE — Telephone Encounter (Signed)
 Prescription refill request for Eliquis  received. Indication: afib  Last office visit: Wonda, 01/07/2024 Scr:0.74, 12/01/2023 Age: 78 kg Weight: 83.2 kg   Refill sent.

## 2024-04-04 ENCOUNTER — Other Ambulatory Visit: Payer: Self-pay | Admitting: Rheumatology

## 2024-04-05 ENCOUNTER — Ambulatory Visit: Attending: Cardiology

## 2024-04-05 DIAGNOSIS — Z95810 Presence of automatic (implantable) cardiac defibrillator: Secondary | ICD-10-CM | POA: Diagnosis not present

## 2024-04-05 DIAGNOSIS — I5022 Chronic systolic (congestive) heart failure: Secondary | ICD-10-CM

## 2024-04-05 NOTE — Telephone Encounter (Signed)
 Last Fill: 03/04/2024  UDS:01/07/2024 UDS suggestive of tramadol  use.   Narc Agreement: 01/05/2024  Next Visit: 06/08/2024  Last Visit: 01/07/2024  Dx: Seropositive rheumatoid arthritis   Current Dose per office note on on 01/07/2024: tramadol  50 mg p.o. p.o. nightly as needed pain.    Okay to refill tramadol ?

## 2024-04-07 ENCOUNTER — Telehealth: Payer: Self-pay

## 2024-04-07 ENCOUNTER — Ambulatory Visit (INDEPENDENT_AMBULATORY_CARE_PROVIDER_SITE_OTHER): Payer: Medicare Other

## 2024-04-07 VITALS — BP 118/78 | HR 84 | Ht 65.0 in | Wt 186.2 lb

## 2024-04-07 DIAGNOSIS — Z0000 Encounter for general adult medical examination without abnormal findings: Secondary | ICD-10-CM

## 2024-04-07 NOTE — Progress Notes (Signed)
 EPIC Encounter for ICM Monitoring  Patient Name: [name] is a 78 y.o. male Date: 04/07/2024 Primary Care Physican: [name], MD Primary Cardiologist: Wonda Electrophysiologist: Inocencio Bi-V Pacing:   94.1%   07/14/2023 Office Weight: 187 lbs 09/24/2023 Office Weight: 187 lbs 02/26/2024 Office Weight: 183.8 lbs 03/05/2024 Weight: 185 lbs   Time in AT/AF  0.0 hr/day (0.0%)                                                          Attempted call to patient and unable to reach.   Transmission results reviewed.    Diet:  Does eat restaurant foods.     Since 01/21/2024 (battery replacement date) Optivol thoracic impedance suggesting possible fluid accumulation starting 03/26/2024 but is trending close to baseline.   Prescribed: Spironolactone  25 mg take 1 tablet by mouth daily.   Labs: 01/07/2024 Creatinine 0.85, BUN 15, Potassium 4.5, Sodium 138, GFR 88 12/01/2023 Creatinine 0.74, BUN 13, Potassium 4.3, Sodium 140, GFR 92 06/10/2023 Creatinine 0.89, BUN 23, Potassium 3.8, Sodium 139, GFR 81.65 A complete set of results can be found in Results Review.   Recommendations:  Unable to reach.     Follow-up plan: ICM clinic phone appointment on 05/17/2024 (office check 04/21/2024).   91 day device clinic remote transmission 06/04/2024.       EP/Cardiology Office Visits:  04/21/2024 with Dr Inocencio.      Copy of ICM check sent to Dr. Inocencio.     Remote monitoring is medically necessary for Heart Failure Management.    Daily Thoracic Impedance ICM trend: 01/20/2024 through 04/05/2024.    12-14 Month Thoracic Impedance ICM trend:     Mitzie GORMAN Garner, RN 04/07/2024 2:14 PM

## 2024-04-07 NOTE — Progress Notes (Signed)
 Chief Complaint  Patient presents with   Medicare Wellness     Subjective:   [name] is a 78 y.o. male who presents for a Medicare Annual Wellness Visit.  Allergies (verified) Sulfonamide derivatives   History: Past Medical History:  Diagnosis Date   AICD (automatic cardioverter/defibrillator) present    Allergic rhinitis    Allergy    Anginal pain 05/26/2014   chest pain after chasing dog   Atypical nevus of back 04/27/2003   moderate - mid lower back   Basal cell carcinoma 01/26/2008   right cheek - MOHs   CAD (coronary artery disease)    hx of stent- 2005 RCA   Cataract    beginning stage both eyes   CHF (congestive heart failure) (HCC)    pacemaker Medtronic     Chronic back pain    intermittent   Constipation    Dizziness    Dysrhythmia    right bundle branch block    Esophageal stricture    GERD (gastroesophageal reflux disease)    Heart murmur    Hemoptysis    Hiatal hernia    History of colonic polyps    hyperplastic   HTN (hypertension)    Hyperlipidemia    Hypertrophy of prostate with urinary obstruction and other lower urinary tract symptoms (LUTS)    OA (osteoarthritis)    OSA (obstructive sleep apnea)    cpap- 10    Other specified disorder of stomach and duodenum    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   Pericarditis 07/06/2019   Pneumonia    Pre-diabetes    no medications   Shortness of breath    with exertion   Sleep apnea    cpap   Testicular hypofunction    Past Surgical History:  Procedure Laterality Date   BILIARY STENT PLACEMENT N/A 03/19/2021   Procedure: BILIARY STENT PLACEMENT;  Surgeon: Wilhelmenia Aloha Raddle., MD;  Location: [facility];  Service: Gastroenterology;  Laterality: N/A;   BIV ICD GENERATOR CHANGEOUT N/A 01/21/2024   Procedure: BIV ICD GENERATOR CHANGEOUT;  Surgeon: Inocencio Soyla Lunger, MD;  Location: [facility] INVASIVE CV LAB;  Service: Cardiovascular;  Laterality: N/A;    BIV ICD INSERTION CRT-D N/A 03/27/2017   Procedure: BIV ICD INSERTION CRT-D;  Surgeon: Inocencio Soyla Lunger, MD;  Location: [facility] INVASIVE CV LAB;  Service: Cardiovascular;  Laterality: N/A;   CARDIAC CATHETERIZATION     '05, last 2009, showing patent RCA stent   COLONOSCOPY  12/2007   HYPERPLASTIC POLYP   COLONOSCOPY  12/2019   COLONOSCOPY WITH PROPOFOL  N/A 06/02/2014   Procedure: COLONOSCOPY WITH PROPOFOL ;  Surgeon: Toribio SHAUNNA Teressa, MD;  Location: [facility];  Service: Endoscopy;  Laterality: N/A;   CORONARY ANGIOPLASTY  08/2003   ENDOSCOPIC MUCOSAL RESECTION  03/19/2021   Procedure: ENDOSCOPIC MUCOSAL RESECTION, ampullectomy;  Surgeon: Wilhelmenia Aloha Raddle., MD;  Location: [facility];  Service: Gastroenterology;;   ENDOSCOPIC RETROGRADE CHOLANGIOPANCREATOGRAPHY (ERCP) WITH PROPOFOL  N/A 03/19/2021   Procedure: ENDOSCOPIC RETROGRADE CHOLANGIOPANCREATOGRAPHY (ERCP) WITH PROPOFOL ;  Surgeon: Wilhelmenia Aloha Raddle., MD;  Location: [facility];  Service: Gastroenterology;  Laterality: N/A;   ENDOSCOPIC RETROGRADE CHOLANGIOPANCREATOGRAPHY (ERCP) WITH PROPOFOL  N/A 11/29/2021   Procedure: ENDOSCOPIC RETROGRADE CHOLANGIOPANCREATOGRAPHY (ERCP) WITH PROPOFOL ;  Surgeon: Wilhelmenia Aloha Raddle., MD;  Location: [facility] ENDOSCOPY;  Service: Gastroenterology;  Laterality: N/A;   ESOPHAGOGASTRODUODENOSCOPY (EGD) WITH PROPOFOL  N/A 06/02/2014   Procedure: ESOPHAGOGASTRODUODENOSCOPY (EGD) WITH PROPOFOL ;  Surgeon: Toribio SHAUNNA Teressa, MD;  Location: [facility];  Service: Endoscopy;  Laterality: N/A;   ESOPHAGOGASTRODUODENOSCOPY (EGD) WITH PROPOFOL  N/A 03/19/2021   Procedure: ESOPHAGOGASTRODUODENOSCOPY (EGD) WITH PROPOFOL ;  Surgeon: Wilhelmenia Aloha Raddle., MD;  Location: [facility];  Service: Gastroenterology;  Laterality: N/A;   ESOPHAGOGASTRODUODENOSCOPY (EGD) WITH PROPOFOL  N/A 03/23/2021   Procedure: ESOPHAGOGASTRODUODENOSCOPY (EGD) WITH PROPOFOL ;  Surgeon: Aneita Gwendlyn DASEN, MD;  Location: [facility];   Service: Endoscopy;  Laterality: N/A;   ESOPHAGOGASTRODUODENOSCOPY (EGD) WITH PROPOFOL  N/A 03/13/2023   Procedure: ESOPHAGOGASTRODUODENOSCOPY (EGD) WITH PROPOFOL ;  Surgeon: Wilhelmenia Aloha Raddle., MD;  Location: [facility];  Service: Gastroenterology;  Laterality: N/A;  WITH SIDE SCOPE   HERNIA REPAIR     hernia surgery x 3     Bilateral Inguinal, Umbicial   INSERTION OF MESH N/A 03/20/2018   Procedure: INSERTION OF MESH;  Surgeon: Ethyl Lenis, MD;  Location: [facility] OR;  Service: General;  Laterality: N/A;   IRRIGATION AND DEBRIDEMENT ABSCESS Left 08/14/2012   Procedure: IRRIGATION AND DEBRIDEMENT LEFT INGUINAL BOIL ;  Surgeon: Arlena LILLETTE Gal, MD;  Location: [facility];  Service: Urology;  Laterality: Left;   JOINT REPLACEMENT Right 2017   KNEE ARTHROPLASTY     LEFT HEART CATH AND CORONARY ANGIOGRAPHY N/A 01/24/2019   Procedure: LEFT HEART CATH AND CORONARY ANGIOGRAPHY;  Surgeon: Jordan, Peter M, MD;  Location: [facility] INVASIVE CV LAB;  Service: Cardiovascular;  Laterality: N/A;   LEFT HEART CATHETERIZATION WITH CORONARY ANGIOGRAM N/A 05/26/2014   Procedure: LEFT HEART CATHETERIZATION WITH CORONARY ANGIOGRAM;  Surgeon: Ozell JONETTA Fell, MD;  Location: [facility] CATH LAB;  Service: Cardiovascular;  Laterality: N/A;   LUMBAR LAMINECTOMY/DECOMPRESSION MICRODISCECTOMY Left 09/26/2014   Procedure: Lumbar Laminectomy for resection of synovial cyst  Lumbar five- sacral one left;  Surgeon: Arley Helling, MD;  Location: [facility];  Service: Neurosurgery;  Laterality: Left;   MOLE REMOVAL Left 03/20/2018   Procedure: MOLE REMOVAL;  Surgeon: Ethyl Lenis, MD;  Location: [facility] OR;  Service: General;  Laterality: Left;   Oral surg to removed growth from ?sinus  10/2010   ?Dermoid removed by DrRiggs   PACEMAKER INSERTION  04/03/2017   PANCREATIC STENT PLACEMENT  03/19/2021   Procedure: PANCREATIC STENT PLACEMENT;  Surgeon: Wilhelmenia Aloha Raddle., MD;  Location: [facility];  Service: Gastroenterology;;    POLYPECTOMY     RCA stenting     '05 RCA   REMOVAL OF STONES  11/29/2021   Procedure: REMOVAL OF SLUDGE;  Surgeon: Wilhelmenia Aloha Raddle., MD;  Location: [facility] ENDOSCOPY;  Service: Gastroenterology;;   right toe surgery  Right    Cyst    RIGHT/LEFT HEART CATH AND CORONARY ANGIOGRAPHY N/A 01/15/2017   Procedure: RIGHT/LEFT HEART CATH AND CORONARY ANGIOGRAPHY;  Surgeon: Fell Ozell, MD;  Location: [facility] INVASIVE CV LAB;  Service: Cardiovascular;  Laterality: N/A;   s/p right knee arthroscopy  2005   SPHINCTEROTOMY  03/19/2021   Procedure: SPHINCTEROTOMY;  Surgeon: Mansouraty, Aloha Raddle., MD;  Location: [facility];  Service: Gastroenterology;;   SPINE SURGERY     STENT REMOVAL  11/29/2021   Procedure: STENT REMOVAL;  Surgeon: Wilhelmenia Aloha Raddle., MD;  Location: [facility]OSCOPY;  Service: Gastroenterology;;   SUBMUCOSAL LIFTING INJECTION  03/19/2021   Procedure: SUBMUCOSAL LIFTING INJECTION;  Surgeon: Wilhelmenia Aloha Raddle., MD;  Location: [facility];  Service: Gastroenterology;;   thumb surgery Right    TOTAL KNEE ARTHROPLASTY Right 03/12/2016   Procedure: RIGHT TOTAL KNEE ARTHROPLASTY;  Surgeon: Donnice Car, MD;  Location: [facility];  Service: Orthopedics;  Laterality: Right;   TOTAL KNEE ARTHROPLASTY Left 12/10/2021  Procedure: TOTAL KNEE ARTHROPLASTY;  Surgeon: Melodi Lerner, MD;  Location: [facility];  Service: Orthopedics;  Laterality: Left;   UPPER ESOPHAGEAL ENDOSCOPIC ULTRASOUND (EUS) N/A 03/19/2021   Procedure: UPPER ESOPHAGEAL ENDOSCOPIC ULTRASOUND (EUS);  Surgeon: Wilhelmenia Aloha Raddle., MD;  Location: [facility];  Service: Gastroenterology;  Laterality: N/A;   UPPER GASTROINTESTINAL ENDOSCOPY     VENTRAL HERNIA REPAIR N/A 03/20/2018   Procedure: LAPAROSCOPIC VENTRAL INCISIONAL  HERNIA ERAS PATHWAY;  Surgeon: Ethyl Lenis, MD;  Location: [facility] OR;  Service: General;  Laterality: N/A;   Family History  Problem Relation Age of Onset   Heart disease Mother    Coronary artery  disease Father    Diabetes Father    Sudden death Father        due to heart disease   Heart disease Father    COPD Brother    Arthritis Brother    Heart failure Brother    Cancer Brother        lung    Stomach cancer Paternal Grandmother    Arthritis Daughter    Hypertension Other    Colon cancer Neg Hx    Esophageal cancer Neg Hx    Colon polyps Neg Hx    Ulcerative colitis Neg Hx    Liver disease Neg Hx    Pancreatic cancer Neg Hx    Inflammatory bowel disease Neg Hx    Rectal cancer Neg Hx    Social History   Occupational History   Occupation: Advertising Account Planner: [employer]  Tobacco Use   Smoking status: Former    Current packs/day: 0.00    Types: Cigarettes    Quit date: 05/20/1990    Years since quitting: 33.9    Passive exposure: Never   Smokeless tobacco: Never   Tobacco comments:    previous 30 pack year history  Vaping Use   Vaping status: Never Used  Substance and Sexual Activity   Alcohol  use: Not Currently    Comment: rare   Drug use: Never   Sexual activity: Yes   Tobacco Counseling Counseling given: Not Answered Tobacco comments: previous 30 pack year history  SDOH Screenings   Food Insecurity: No Food Insecurity (04/07/2024)  Housing: Unknown (04/07/2024)  Transportation Needs: No Transportation Needs (04/07/2024)  Utilities: Not At Risk (04/07/2024)  Depression (PHQ2-9): Low Risk  (04/07/2024)  Financial Resource Strain: Low Risk  (04/07/2024)  Physical Activity: Insufficiently Active (04/07/2024)  Social Connections: Socially Integrated (04/07/2024)  Stress: No Stress Concern Present (04/07/2024)  Tobacco Use: Medium Risk (04/07/2024)  Health Literacy: Adequate Health Literacy (04/07/2024)   See flowsheets for full screening details  Depression Screen PHQ 2 & 9 Depression Scale- Over the past 2 weeks, how often have you been bothered by any of the following problems? Little interest or pleasure in doing things:  0 Feeling down, depressed, or hopeless (PHQ Adolescent also includes...irritable): 0 PHQ-2 Total Score: 0 Trouble falling or staying asleep, or sleeping too much: 0 Feeling tired or having little energy: 0 Poor appetite or overeating (PHQ Adolescent also includes...weight loss): 0 Feeling bad about yourself - or that you are a failure or have let yourself or your family down: 0 Trouble concentrating on things, such as reading the newspaper or watching television (PHQ Adolescent also includes...like school work): 0 Moving or speaking so slowly that other people could have noticed. Or the opposite - being so fidgety or restless that you have been moving around a lot more than usual: 0  Thoughts that you would be better off dead, or of hurting yourself in some way: 0 PHQ-9 Total Score: 0 If you checked off any problems, how difficult have these problems made it for you to do your work, take care of things at home, or get along with other people?: Not difficult at all  Depression Treatment Depression Interventions/Treatment : EYV7-0 Score <4 Follow-up Not Indicated     Goals Addressed               This Visit's Progress     Patient Stated (pt-stated)        Patient stated he plans to walk daily       Visit info / Clinical Intake: Medicare Wellness Visit Type:: Subsequent Annual Wellness Visit Persons participating in visit:: patient Medicare Wellness Visit Mode:: In-person (required for WTM) Information given by:: patient Interpreter Needed?: No Pre-visit prep was completed: yes AWV questionnaire completed by patient prior to visit?: yes Date:: 04/07/24 Living arrangements:: lives with spouse/significant other Patient's Overall Health Status Rating: very good Typical amount of pain: none Does pain affect daily life?: no Are you currently prescribed opioids?: no  Dietary Habits and Nutritional Risks How many meals a day?: 2 Eats fruit and vegetables daily?: yes Most meals  are obtained by: preparing own meals; eating out In the last 2 weeks, have you had any of the following?: none Diabetic:: no  Functional Status Activities of Daily Living (to include ambulation/medication): Independent Ambulation: Independent with device- listed below Home Assistive Devices/Equipment: Eyeglasses; CPAP Medication Administration: Independent Home Management: Independent Manage your own finances?: yes Primary transportation is: driving Concerns about vision?: no *vision screening is required for WTM* Concerns about hearing?: no  Fall Screening Falls in the past year?: 0 Number of falls in past year: 0 Was there an injury with Fall?: 0 Fall Risk Category Calculator: 0 Patient Fall Risk Level: Low Fall Risk  Fall Risk Patient at Risk for Falls Due to: No Fall Risks Fall risk Follow up: Falls evaluation completed; Falls prevention discussed  Home and Transportation Safety: All rugs have non-skid backing?: yes All stairs or steps have railings?: yes Grab bars in the bathtub or shower?: (!) no Have non-skid surface in bathtub or shower?: yes Good home lighting?: yes Regular seat belt use?: yes Hospital stays in the last year:: no  Cognitive Assessment Difficulty concentrating, remembering, or making decisions? : no Will 6CIT or Mini Cog be Completed: yes What year is it?: 0 points What month is it?: 0 points Give patient an address phrase to remember (5 components): [address] [place] About what time is it?: 0 points Count backwards from 20 to 1: 0 points Say the months of the year in reverse: 0 points Repeat the address phrase from earlier: 0 points 6 CIT Score: 0 points  Advance Directives (For Healthcare) Does Patient Have a Medical Advance Directive?: Yes Does patient want to make changes to medical advance directive?: Yes (Inpatient - patient requests chaplain consult to change a medical advance directive) Type of Advance Directive: Healthcare  Power of [place]; Living will Copy of Healthcare Power of Attorney in Chart?: No - copy requested Copy of Living Will in Chart?: No - copy requested  Reviewed/Updated  Reviewed/Updated: Reviewed All (Medical, Surgical, Family, Medications, Allergies, Care Teams, Patient Goals)        Objective:    Today's Vitals   04/07/24 1018  BP: 118/78  Pulse: 84  SpO2: 97%  Weight: 186 lb 3.2 oz (84.5  kg)  Height: 5' 5 (1.651 m)   Body mass index is 30.99 kg/m.  Current Medications (verified) Outpatient Encounter Medications as of 04/07/2024  Medication Sig   acetaminophen  (TYLENOL ) 650 MG CR tablet Take 650 mg by mouth in the morning and at bedtime.   albuterol  (VENTOLIN  HFA) 108 (90 Base) MCG/ACT inhaler Inhale 2 puffs into the lungs every 6 (six) hours as needed for wheezing or shortness of breath.   apixaban  (ELIQUIS ) 5 MG TABS tablet Take 1 tablet (5 mg total) by mouth 2 (two) times daily.   atorvastatin  (LIPITOR) 20 MG tablet TAKE 1 TABLET BY MOUTH DAILY   b complex vitamins tablet Take 1 tablet by mouth daily with lunch.   benzonatate  (TESSALON ) 200 MG capsule Take 1 capsule (200 mg total) by mouth 3 (three) times daily as needed for cough.   betamethasone dipropionate 0.05 % cream Apply 1 Application topically daily as needed (Dermatitis).   carvedilol  (COREG ) 25 MG tablet TAKE 1 TABLET BY MOUTH TWICE DAILY   cholecalciferol (VITAMIN D3) 25 MCG (1000 UNIT) tablet Take 1,000 Units by mouth daily.   Coenzyme Q10 (CO Q 10 PO) Take 200 mg by mouth daily with lunch.   doxycycline  (VIBRA -TABS) 100 MG tablet Take 1 tablet (100 mg total) by mouth 2 (two) times daily.   Flaxseed, Linseed, (FLAXSEED OIL PO) Take 1 capsule by mouth daily. With Omega 3   folic acid  (FOLVITE ) 400 MCG tablet Take 400 mcg by mouth daily.   Glucosamine HCl (GLUCOSAMINE PO) Take 1,500 mg by mouth every evening.   hydrocortisone  2.5 % cream Apply 1 application topically 2 (two) times daily as needed (rash on  nose).    hydroxychloroquine  (PLAQUENIL ) 200 MG tablet TAKE 1 TABLET BY MOUTH DAILY ON MONDAY THROUGH FRIDAY AND SKIP SATURDAY AND SUNDAY   Lutein  20 MG TABS Take 20 mg by mouth daily.   Multiple Vitamin (MULTIVITAMIN) capsule Take 1 capsule by mouth daily.     nitroGLYCERIN  (NITROSTAT ) 0.4 MG SL tablet Dissolve 1 tablet under the tongue every 5 minutes as needed for chest pain. Max of 3 doses, then 911.   NON FORMULARY CPAP machine with sleep.   omeprazole  (PRILOSEC) 40 MG capsule TAKE 1 CAPSULE(40 MG) BY MOUTH IN THE MORNING AND AT BEDTIME   pantoprazole  (PROTONIX ) 40 MG tablet Take 1 tablet (40 mg total) by mouth daily. before breakfast   polyethylene glycol (MIRALAX  / GLYCOLAX ) 17 g packet Take 17 g by mouth daily as needed for mild constipation.   Probiotic Product (PROBIOTIC DAILY PO) Take 1 capsule by mouth daily with lunch.    RESTASIS  0.05 % ophthalmic emulsion Place 1 drop into both eyes 2 (two) times daily as needed (dry eyes).   sacubitril -valsartan  (ENTRESTO ) 49-51 MG TAKE 1 TABLET BY MOUTH TWICE DAILY   Saw Palmetto, Serenoa repens, (SAW PALMETTO PO) Take 450 mg by mouth every evening.   spironolactone  (ALDACTONE ) 25 MG tablet Take 1 tablet (25 mg total) by mouth daily.   tamsulosin  (FLOMAX ) 0.4 MG CAPS capsule Take 0.4 mg by mouth daily.   Testosterone  20 % CREA Apply 2 mLs topically daily. Rub on shoulder   tobramycin-dexamethasone  (TOBRADEX) ophthalmic ointment Place 1 Application into both eyes as needed (Irritation).   traMADol  (ULTRAM ) 50 MG tablet Take 1 tablet (50 mg total) by mouth at bedtime as needed.   TURMERIC PO Take 1 tablet by mouth daily with lunch.    vitamin C (ASCORBIC ACID) 500 MG tablet Take 500 mg  by mouth daily.   zinc gluconate 50 MG tablet Take 50 mg by mouth daily.   No facility-administered encounter medications on file as of 04/07/2024.   Hearing/Vision screen Hearing Screening - Comments:: Denies hearing difficulties   Vision Screening -  Comments:: Wears rx glasses - up to date with routine eye exams with Lorrene [facility]hire Immunizations and Health Maintenance Health Maintenance  Topic Date Due   COVID-19 Vaccine (5 - 2025-26 season) 01/19/2024   DTaP/Tdap/Td (2 - Td or Tdap) 11/28/2024   Medicare Annual Wellness (AWV)  04/07/2025   Pneumococcal Vaccine: 50+ Years  Completed   Influenza Vaccine  Completed   Zoster Vaccines- Shingrix  Completed   Meningococcal B Vaccine  Aged Out   Colonoscopy  Discontinued   Hepatitis C Screening  Discontinued        Assessment/Plan:  This is a routine wellness examination for Khalon.  Patient Care Team: [name], MD as PCP - General (Internal Medicine) Inocencio Soyla Lunger, MD as PCP - Electrophysiology (Cardiology) Wonda Sharper, MD as PCP - Cardiology (Cardiology) Devonshire Lorrene DEL., MD as Consulting Physician (Ophthalmology) Mansouraty, Aloha Raddle., MD as Consulting Physician (Gastroenterology)  I have personally reviewed and noted the following in the patient's chart:   Medical and social history Use of alcohol , tobacco or illicit drugs  Current medications and supplements including opioid prescriptions. Functional ability and status Nutritional status Physical activity Advanced directives List of other physicians Hospitalizations, surgeries, and ER visits in previous 12 months Vitals Screenings to include cognitive, depression, and falls Referrals and appointments  No orders of the defined types were placed in this encounter.  In addition, I have reviewed and discussed with patient certain preventive protocols, quality metrics, and best practice recommendations. A written personalized care plan for preventive services as well as general preventive health recommendations were provided to patient.   Verdie CHRISTELLA Saba, CMA   04/07/2024   Return in 1 year (on 04/07/2025).  After Visit Summary: (In Person-Declined) Patient declined AVS at this time.  Nurse Notes:  Scheduled 2026 AWV/CPE appts.

## 2024-04-07 NOTE — Patient Instructions (Signed)
 [name],  Thank you for taking the time for your Medicare Wellness Visit. I appreciate your continued commitment to your health goals. Please review the care plan we discussed, and feel free to reach out if I can assist you further.  Please note that Annual Wellness Visits do not include a physical exam. Some assessments may be limited, especially if the visit was conducted virtually. If needed, we may recommend an in-person follow-up with your provider.  Ongoing Care Seeing your primary care provider every 3 to 6 months helps us  monitor your health and provide consistent, personalized care.   Referrals If a referral was made during today's visit and you haven't received any updates within two weeks, please contact the referred provider directly to check on the status.  Recommended Screenings:  Health Maintenance  Topic Date Due   COVID-19 Vaccine (5 - 2025-26 season) 01/19/2024   DTaP/Tdap/Td vaccine (2 - Td or Tdap) 11/28/2024   Medicare Annual Wellness Visit  04/07/2025   Pneumococcal Vaccine for age over 10  Completed   Flu Shot  Completed   Zoster (Shingles) Vaccine  Completed   Meningitis B Vaccine  Aged Out   Colon Cancer Screening  Discontinued   Hepatitis C Screening  Discontinued       01/21/2024    1:24 PM  Advanced Directives  Does Patient Have a Medical Advance Directive? Yes  Type of Estate Agent of [place];Living will    Vision: Annual vision screenings are recommended for early detection of glaucoma, cataracts, and diabetic retinopathy. These exams can also reveal signs of chronic conditions such as diabetes and high blood pressure.  Dental: Annual dental screenings help detect early signs of oral cancer, gum disease, and other conditions linked to overall health, including heart disease and diabetes.

## 2024-04-07 NOTE — Telephone Encounter (Signed)
 Remote ICM transmission received.  Attempted call to patient regarding ICM remote transmission and no answer.

## 2024-04-20 ENCOUNTER — Other Ambulatory Visit: Payer: Self-pay | Admitting: Cardiovascular Disease

## 2024-04-20 ENCOUNTER — Other Ambulatory Visit: Payer: Self-pay | Admitting: Physician Assistant

## 2024-04-21 ENCOUNTER — Encounter: Payer: Self-pay | Admitting: Cardiology

## 2024-04-21 ENCOUNTER — Ambulatory Visit: Attending: Cardiology | Admitting: Cardiology

## 2024-04-21 VITALS — BP 130/82 | HR 73 | Ht 67.0 in | Wt 191.8 lb

## 2024-04-21 DIAGNOSIS — I5022 Chronic systolic (congestive) heart failure: Secondary | ICD-10-CM

## 2024-04-21 DIAGNOSIS — I2510 Atherosclerotic heart disease of native coronary artery without angina pectoris: Secondary | ICD-10-CM

## 2024-04-21 DIAGNOSIS — I480 Paroxysmal atrial fibrillation: Secondary | ICD-10-CM

## 2024-04-21 DIAGNOSIS — D6869 Other thrombophilia: Secondary | ICD-10-CM

## 2024-04-21 DIAGNOSIS — I10 Essential (primary) hypertension: Secondary | ICD-10-CM | POA: Diagnosis not present

## 2024-04-21 LAB — CUP PACEART INCLINIC DEVICE CHECK
Date Time Interrogation Session: 20251203172438
Implantable Lead Connection Status: 753985
Implantable Lead Connection Status: 753985
Implantable Lead Connection Status: 753985
Implantable Lead Implant Date: 20181108
Implantable Lead Implant Date: 20181108
Implantable Lead Implant Date: 20181108
Implantable Lead Location: 753858
Implantable Lead Location: 753859
Implantable Lead Location: 753860
Implantable Lead Model: 4298
Implantable Lead Model: 5076
Implantable Pulse Generator Implant Date: 20250903

## 2024-04-21 NOTE — Progress Notes (Signed)
 Electrophysiology Office Note:   Date:  04/21/2024  ID:  [name], DOB Nov 10, 1945, MRN [id number]  Primary Cardiologist: Ozell Fell, MD Primary Heart Failure: None Electrophysiologist: Nivan Melendrez Gladis Norton, MD      History of Present Illness:   [name] is a 78 y.o. male with h/o coronary artery disease with PCI to the RCA, chronic systolic heart failure, left bundle branch block, atrial fibrillation, hypertension, hyperlipidemia, sleep apnea seen today for routine electrophysiology followup.   Discussed the use of AI scribe software for clinical note transcription with the patient, who gave verbal consent to proceed.  History of Present Illness He is an 78 year old male who presents for routine follow-up of his cardiac device.  He reports that he was told his cardiac device is expected to last for another six and a half years. No issues with the device have been noted.  He has experienced a cough throughout the year, but it has mostly resolved at this time.  He is currently taking Eliquis  and Entresto , which are managed by another physician.  he denies chest pain, palpitations, dyspnea, PND, orthopnea, nausea, vomiting, dizziness, syncope, edema, weight gain, or early satiety.   Review of systems complete and found to be negative unless listed in HPI.      EP Information / Studies Reviewed:    EKG is ordered today. Personal review as below.  EKG Interpretation Date/Time:  Wednesday April 21 2024 16:55:18 EST Ventricular Rate:  73 PR Interval:  168 QRS Duration:  158 QT Interval:  444 QTC Calculation: 489 R Axis:   270  Text Interpretation: Atrial-sensed ventricular-paced rhythm with occasional AV dual-paced complexes and with occasional Premature ventricular complexes When compared with ECG of 21-Jan-2024 13:44, No significant change since last tracing Confirmed by Kamarie Veno ([id number]) on 04/21/2024 5:16:01 PM   ICD Interrogation-  reviewed in detail  today,  See PACEART report.  Device History: Medtronic BiV ICD implanted 03/27/2017 with generator change 01/21/2024 for chronic systolic heart failure History of appropriate therapy: No History of AAD therapy: No   Risk Assessment/Calculations:    CHA2DS2-VASc Score = 5   This indicates a 7.2% annual risk of stroke. The patient's score is based upon: CHF History: 1 HTN History: 1 Diabetes History: 0 Stroke History: 0 Vascular Disease History: 1 Age Score: 2 Gender Score: 0            Physical Exam:   VS:  BP 130/82 (BP Location: Right Arm, Patient Position: Sitting, Cuff Size: Normal)   Pulse 73   Ht 5' 7 (1.702 m)   Wt 191 lb 12.8 oz (87 kg)   SpO2 94%   BMI 30.04 kg/m    Wt Readings from Last 3 Encounters:  04/21/24 191 lb 12.8 oz (87 kg)  04/07/24 186 lb 3.2 oz (84.5 kg)  02/26/24 183 lb 8 oz (83.2 kg)     GEN: Well nourished, well developed in no acute distress NECK: No JVD; No carotid bruits CARDIAC: Regular rate and rhythm, no murmurs, rubs, gallops RESPIRATORY:  Clear to auscultation without rales, wheezing or rhonchi  ABDOMEN: Soft, non-tender, non-distended EXTREMITIES:  No edema; No deformity   ASSESSMENT AND PLAN:    Chronic systolic dysfunction s/p Medtronic CRT-D  euvolemic today Stable on an appropriate medical regimen Normal ICD function See Pace Art report No changes today  2.  Paroxysmal atrial fibrillation: Low burden on device interrogation.  Continue with current management.  3.  Secondary hypercoagulable state: On  Eliquis   4.  Coronary disease: No current ischemic symptoms.  Plan per primary cardiology  Disposition:   Follow up with EP Team in 12 months   Signed, Morrissa Shein Gladis Norton, MD

## 2024-04-23 ENCOUNTER — Ambulatory Visit: Payer: Self-pay | Admitting: Cardiology

## 2024-04-23 ENCOUNTER — Encounter: Admitting: Cardiology

## 2024-04-27 ENCOUNTER — Other Ambulatory Visit: Payer: Self-pay

## 2024-04-27 ENCOUNTER — Telehealth: Payer: Self-pay | Admitting: Cardiology

## 2024-04-27 DIAGNOSIS — I480 Paroxysmal atrial fibrillation: Secondary | ICD-10-CM

## 2024-04-27 MED ORDER — APIXABAN 5 MG PO TABS: 5.0000 mg | ORAL_TABLET | Freq: Two times a day (BID) | ORAL | 1 refills | Status: AC

## 2024-04-27 NOTE — Telephone Encounter (Signed)
*  STAT* If patient is at the pharmacy, call can be transferred to refill team.   1. Which medications need to be refilled? (please list name of each medication and dose if known) apixaban  (ELIQUIS ) 5 MG TABS tablet    2. Would you like to learn more about the convenience, safety, & potential cost savings by using the [facility]?     3. Are you open to using the [facility] (Type [facility].  ).   4. Which pharmacy/location (including street and city if local pharmacy) is medication to be sent to?  [id number] - [place], [place] - [address] AT [facility] [address]     5. Do they need a 30 day or 90 day supply? 90 day

## 2024-04-27 NOTE — Telephone Encounter (Signed)
 Prescription refill request for Eliquis  received. Indication:afib Last office visit:12/25 Scr: 0.85  8/25 Age:78 Weight:87  kg  Prescription refilled

## 2024-04-29 ENCOUNTER — Ambulatory Visit: Admitting: Pulmonary Disease

## 2024-05-03 ENCOUNTER — Telehealth: Payer: Self-pay | Admitting: Cardiology

## 2024-05-03 ENCOUNTER — Encounter

## 2024-05-03 NOTE — Telephone Encounter (Signed)
°*  STAT* If patient is at the pharmacy, call can be transferred to refill team.   1. Which medications need to be refilled? (please list name of each medication and dose if known)   apixaban  (ELIQUIS ) 5 MG TABS tablet    sacubitril -valsartan  (ENTRESTO ) 49-51 MG    2. Which pharmacy/location (including street and city if local pharmacy) is medication to be sent to?  [id number] - [place], [place] - [address] AT [facility] OF [address]    3. Do they need a 30 day or 90 day supply? 90

## 2024-05-04 ENCOUNTER — Other Ambulatory Visit: Payer: Self-pay | Admitting: *Deleted

## 2024-05-04 MED ORDER — TRAMADOL HCL 50 MG PO TABS: 50.0000 mg | ORAL_TABLET | Freq: Every evening | ORAL | 0 refills | Status: DC | PRN

## 2024-05-04 NOTE — Telephone Encounter (Signed)
 Returned call to pt.  Left a message that he has refills at [facility] good through 2026 and to call us  if he needed further assistance.

## 2024-05-04 NOTE — Telephone Encounter (Signed)
 Patient contacted the office and requested refill on Tramadol .  Last Fill: 04/05/2024  UDS:01/07/2024 UDS suggestive of tramadol  use.   Narc Agreement: 01/04/2024  Next Visit: 06/08/2024  Last Visit: 01/07/2024  Dx: Seropositive rheumatoid arthritis  Current Dose per office note on 01/07/2024:tramadol  50 mg p.o. p.o. nightly as needed pain.    Okay to refill Tramadol ?

## 2024-05-17 ENCOUNTER — Telehealth: Payer: Self-pay | Admitting: Internal Medicine

## 2024-05-17 ENCOUNTER — Ambulatory Visit: Attending: Cardiology

## 2024-05-17 DIAGNOSIS — I5022 Chronic systolic (congestive) heart failure: Secondary | ICD-10-CM

## 2024-05-17 DIAGNOSIS — Z95810 Presence of automatic (implantable) cardiac defibrillator: Secondary | ICD-10-CM

## 2024-05-17 NOTE — Telephone Encounter (Signed)
 Patient requests TOC from Dr. Lynwood Rush to [facility].  Please advise.

## 2024-05-18 ENCOUNTER — Telehealth: Payer: Self-pay

## 2024-05-18 NOTE — Telephone Encounter (Signed)
 LVM advising pt TOC was declined and advised to schedule with an md due to complexity of care.

## 2024-05-18 NOTE — Telephone Encounter (Signed)
 prefer to decline this TOC please.

## 2024-05-18 NOTE — Telephone Encounter (Signed)
 Copied from CRM #[id number]. Topic: Appointments - Transfer of Care >> May 18, 2024  3:03 PM Ashley R wrote: Pt is requesting to transfer FROM: Norleen Agent Pt is requesting to transfer TO: Wendolyn Caldron Reason for requested transfer: Dr Norleen Han It is the responsibility of the team the patient would like to transfer to (Dr. Wendolyn) to reach out to the patient if for any reason this transfer is not acceptable.  Scheduling with MD as requested per VM. Pt. Was seen at hospital for gallbladder emeregency, but scheduled with gastro in Feb.  Please see TOC request and advise

## 2024-05-19 NOTE — Progress Notes (Signed)
 EPIC Encounter for ICM Monitoring  Patient Name: [name] is a 78 y.o. male Date: 05/19/2024 Primary Care Physican: [name], MD Primary Cardiologist: Wonda Electrophysiologist: Inocencio Bi-V Pacing:   93.8%   07/14/2023 Office Weight: 187 lbs 09/24/2023 Office Weight: 187 lbs 02/26/2024 Office Weight: 183.8 lbs 03/05/2024 Weight: 185 lbs 04/21/2024 Office Weight: 193 lbs   Time in AT/AF  0.0 hr/day (0.0%)                                                          Transmission results reviewed.    Diet:  Does eat restaurant foods.     Since 04/05/2024 ICM Remote Transmission:  Optivol thoracic impedance suggesting intermittent days with possible fluid accumulation.   Prescribed: Spironolactone  25 mg take 1 tablet by mouth daily.   Labs: 01/07/2024 Creatinine 0.85, BUN 15, Potassium 4.5, Sodium 138, GFR 88 12/01/2023 Creatinine 0.74, BUN 13, Potassium 4.3, Sodium 140, GFR 92 06/10/2023 Creatinine 0.89, BUN 23, Potassium 3.8, Sodium 139, GFR 81.65 A complete set of results can be found in Results Review.   Recommendations:  No changes   Follow-up plan: ICM clinic phone appointment on 06/21/2024.   91 day device clinic remote transmission 06/04/2024.       EP/Cardiology Office Visits:  Recall 07/05/2024 with Dr Wonda.      Copy of ICM check sent to Dr. Inocencio.     Remote monitoring is medically necessary for Heart Failure Management.    Daily Thoracic Impedance ICM trend: 02/16/2024 through 05/17/2024.    12-14 Month Thoracic Impedance ICM trend:     Billy GORMAN Garner, RN 05/19/2024 10:50 AM

## 2024-05-25 NOTE — Progress Notes (Signed)
 "  Office Visit Note  Patient: [name]             Date of Birth: 1946/02/03           MRN: [id number]             PCP: [name], MD Referring: [name], MD Visit Date: 06/08/2024 Occupation: Data Unavailable  Subjective:  Medication management  History of Present Illness: [name] is a 79 y.o. male with seropositive rheumatoid arthritis, osteoarthritis and degenerative disc disease.  He continues to have pain and stiffness in his joints.  He has morning stiffness lasting for an hour and some nocturnal pain.  He has difficulty climbing stairs and getting up from the chair.  He has not had a flare of rheumatoid arthritis.  He has been tolerating hydroxychloroquine  200 mg daily Monday to Friday.  His last eye examination was in May 2025.  He recently had a bone density and would like to discuss the results.  He has been taking tramadol  50 mg every night as needed for pain relief.  He states he is unable to sleep without tramadol .  His knee joint replacements are doing well.  He states on May 13, 2024 he was seen in the emergency room at [facility] for chest pain.  Cardiac etiology was ruled out and he had a study of his abdomen which showed gallstones.    Activities of Daily Living:  Patient reports morning stiffness for 1 hour.   Patient Reports nocturnal pain.  Difficulty dressing/grooming: Denies Difficulty climbing stairs: Reports Difficulty getting out of chair: Reports Difficulty using hands for taps, buttons, cutlery, and/or writing: Reports  Review of Systems  Constitutional:  Positive for fatigue.  HENT:  Negative for mouth sores and mouth dryness.   Eyes:  Negative for dryness.  Respiratory:  Negative for difficulty breathing.   Cardiovascular:  Positive for palpitations.  Gastrointestinal:  Negative for blood in stool, constipation and diarrhea.  Endocrine: Negative for increased urination.  Genitourinary:  Negative for involuntary urination.   Musculoskeletal:  Positive for joint pain, gait problem, joint pain, myalgias, morning stiffness and myalgias. Negative for joint swelling, muscle weakness and muscle tenderness.  Skin:  Negative for color change, rash, hair loss and sensitivity to sunlight.  Allergic/Immunologic: Negative for susceptible to infections.  Neurological:  Positive for dizziness. Negative for headaches.  Hematological:  Negative for swollen glands.  Psychiatric/Behavioral:  Positive for sleep disturbance. Negative for depressed mood. The patient is not nervous/anxious.     PMFS History:  Patient Active Problem List   Diagnosis Date Noted   Bacterial URI 02/22/2024   Cardiac pacemaker in situ 02/22/2024   Vitamin D  deficiency 06/13/2023   PSA elevation 06/13/2023   Chronic sinusitis 06/12/2023   Cough 06/10/2023   Adenomatous duodenal polyp 03/13/2023   COVID 09/18/2022   Rupture of tendon of biceps, long head 07/11/2022   OSA (obstructive sleep apnea) 05/30/2022   Rhinorrhea 05/30/2022   Travel advice encounter 05/30/2022   Inflammation of sacroiliac joint 05/29/2022   Encounter for well adult exam with abnormal findings 05/29/2022   Pain in joint of left knee 12/13/2021   Osteoarthritis of left knee 12/10/2021   Left hip pain 09/30/2021   Chronic anticoagulation 08/31/2021   History of ERCP 08/31/2021   History of biliary stent insertion 08/31/2021   Pancreatic cyst 08/31/2021   Trochanteric bursitis of left hip 06/23/2021   GI bleed 03/23/2021   Melena  Acute upper GI bleed 03/22/2021   AF (paroxysmal atrial fibrillation) (HCC) 03/22/2021   Acquired hallux rigidus of left foot 02/14/2021   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   Abnormal findings on esophagogastroduodenoscopy (EGD) 02/03/2021   Polyp of duodenum 02/03/2021   Axillary abscess 06/26/2020   Chronic diarrhea 06/26/2020   Fever 01/18/2020   Pericarditis 07/06/2019   Right hip pain 07/06/2019   Acute chest pain 01/24/2019    Prediabetes 12/24/2018   Rheumatoid arthritis (HCC) 12/24/2018   Preventative health care 06/25/2018   Incarcerated ventral hernia 03/20/2018   DCM (dilated cardiomyopathy) (HCC) 02/11/2017   Chronic systolic heart failure (HCC) 01/15/2017   History of pneumonia 10/23/2016   OA (osteoarthritis) of knee 10/23/2016   Primary osteoarthritis of both feet 10/04/2016   Primary osteoarthritis of left knee 10/04/2016   DDD (degenerative disc disease), lumbar 10/04/2016   Hemoptysis 05/27/2016   Overweight (BMI 25.0-29.9) 03/14/2016   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   Degeneration of intervertebral disc of cervical spine without prolapsed disc 10/05/2015   Parotid mass 08/23/2015   Dyspnea 11/29/2014   Synovial cyst of lumbar facet joint 09/26/2014   Incisional hernia, without obstruction or gangrene 12/06/2013   Nausea alone 08/27/2013   Colon cancer screening 08/27/2013   Spinal stenosis 04/23/2013   Displacement of lumbar intervertebral disc without myelopathy 12/01/2012   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   Benign prostatic hyperplasia with urinary obstruction 06/18/2010   Other specified disorder of stomach and duodenum 12/16/2008   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 11/15/2008   Gastroesophageal reflux disease 09/02/2008   COLONIC POLYPS, HYPERPLASTIC 05/24/2007   OSA on CPAP 05/24/2007   ALLERGIC RHINITIS 05/24/2007   ESOPHAGEAL STRICTURE 05/24/2007   HIATAL HERNIA 05/24/2007   Hyperlipidemia 02/06/2007   Essential hypertension 02/06/2007   Coronary artery disease involving native coronary artery of native heart without angina pectoris 02/06/2007   Primary osteoarthritis of both hands 02/06/2007   Neck pain on right side 02/06/2007    Past Medical History:  Diagnosis Date   AICD (automatic cardioverter/defibrillator) present    Allergic rhinitis    Allergy    Anginal pain 05/26/2014   chest pain after chasing dog   Atypical nevus of back 04/27/2003   moderate - mid  lower back   Basal cell carcinoma 01/26/2008   right cheek - MOHs   CAD (coronary artery disease)    hx of stent- 2005 RCA   Cataract    beginning stage both eyes   CHF (congestive heart failure) (HCC)    pacemaker [employer]     Chronic back pain    intermittent   Constipation    Dizziness    Dysrhythmia    right bundle branch block    Esophageal stricture    GERD (gastroesophageal reflux disease)    Heart murmur    Hemoptysis    Hiatal hernia    History of colonic polyps    hyperplastic   HTN (hypertension)    Hyperlipidemia    Hypertrophy of prostate with urinary obstruction and other lower urinary tract symptoms (LUTS)    OA (osteoarthritis)    OSA (obstructive sleep apnea)    cpap- 10    Other specified disorder of stomach and duodenum    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   Pericarditis 07/06/2019   Pneumonia    Pre-diabetes    no medications   Shortness of breath    with exertion   Sleep apnea    cpap  Testicular hypofunction     Family History  Problem Relation Age of Onset   Heart disease Mother    Coronary artery disease Father    Diabetes Father    Sudden death Father        due to heart disease   Heart disease Father    COPD Brother    Arthritis Brother    Heart failure Brother    Cancer Brother        lung    Stomach cancer Paternal Grandmother    Arthritis Daughter    Hypertension Other    Colon cancer Neg Hx    Esophageal cancer Neg Hx    Colon polyps Neg Hx    Ulcerative colitis Neg Hx    Liver disease Neg Hx    Pancreatic cancer Neg Hx    Inflammatory bowel disease Neg Hx    Rectal cancer Neg Hx    Past Surgical History:  Procedure Laterality Date   BILIARY STENT PLACEMENT N/A 03/19/2021   Procedure: BILIARY STENT PLACEMENT;  Surgeon: Wilhelmenia Aloha Raddle., MD;  Location: [facility];  Service: Gastroenterology;  Laterality: N/A;   BIV ICD GENERATOR CHANGEOUT N/A 01/21/2024   Procedure: BIV  ICD GENERATOR CHANGEOUT;  Surgeon: Inocencio Soyla Lunger, MD;  Location: [facility] INVASIVE CV LAB;  Service: Cardiovascular;  Laterality: N/A;   BIV ICD INSERTION CRT-D N/A 03/27/2017   Procedure: BIV ICD INSERTION CRT-D;  Surgeon: Inocencio Soyla Lunger, MD;  Location: [facility] INVASIVE CV LAB;  Service: Cardiovascular;  Laterality: N/A;   CARDIAC CATHETERIZATION     '05, last 2009, showing patent RCA stent   COLONOSCOPY  12/2007   HYPERPLASTIC POLYP   COLONOSCOPY  12/2019   COLONOSCOPY WITH PROPOFOL  N/A 06/02/2014   Procedure: COLONOSCOPY WITH PROPOFOL ;  Surgeon: Toribio SHAUNNA Cedar, MD;  Location: [facility];  Service: Endoscopy;  Laterality: N/A;   CORONARY ANGIOPLASTY  08/2003   ENDOSCOPIC MUCOSAL RESECTION  03/19/2021   Procedure: ENDOSCOPIC MUCOSAL RESECTION, ampullectomy;  Surgeon: Wilhelmenia Aloha Raddle., MD;  Location: [facility];  Service: Gastroenterology;;   ENDOSCOPIC RETROGRADE CHOLANGIOPANCREATOGRAPHY (ERCP) WITH PROPOFOL  N/A 03/19/2021   Procedure: ENDOSCOPIC RETROGRADE CHOLANGIOPANCREATOGRAPHY (ERCP) WITH PROPOFOL ;  Surgeon: Wilhelmenia Aloha Raddle., MD;  Location: [facility];  Service: Gastroenterology;  Laterality: N/A;   ENDOSCOPIC RETROGRADE CHOLANGIOPANCREATOGRAPHY (ERCP) WITH PROPOFOL  N/A 11/29/2021   Procedure: ENDOSCOPIC RETROGRADE CHOLANGIOPANCREATOGRAPHY (ERCP) WITH PROPOFOL ;  Surgeon: Wilhelmenia Aloha Raddle., MD;  Location: [facility] ENDOSCOPY;  Service: Gastroenterology;  Laterality: N/A;   ESOPHAGOGASTRODUODENOSCOPY (EGD) WITH PROPOFOL  N/A 06/02/2014   Procedure: ESOPHAGOGASTRODUODENOSCOPY (EGD) WITH PROPOFOL ;  Surgeon: Toribio SHAUNNA Cedar, MD;  Location: [facility];  Service: Endoscopy;  Laterality: N/A;   ESOPHAGOGASTRODUODENOSCOPY (EGD) WITH PROPOFOL  N/A 03/19/2021   Procedure: ESOPHAGOGASTRODUODENOSCOPY (EGD) WITH PROPOFOL ;  Surgeon: Wilhelmenia Aloha Raddle., MD;  Location: [facility];  Service: Gastroenterology;  Laterality: N/A;   ESOPHAGOGASTRODUODENOSCOPY (EGD) WITH PROPOFOL  N/A  03/23/2021   Procedure: ESOPHAGOGASTRODUODENOSCOPY (EGD) WITH PROPOFOL ;  Surgeon: Aneita Gwendlyn DASEN, MD;  Location: [facility];  Service: Endoscopy;  Laterality: N/A;   ESOPHAGOGASTRODUODENOSCOPY (EGD) WITH PROPOFOL  N/A 03/13/2023   Procedure: ESOPHAGOGASTRODUODENOSCOPY (EGD) WITH PROPOFOL ;  Surgeon: Wilhelmenia Aloha Raddle., MD;  Location: [facility];  Service: Gastroenterology;  Laterality: N/A;  WITH SIDE SCOPE   HERNIA REPAIR     hernia surgery x 3     Bilateral Inguinal, Umbicial   INSERTION OF MESH N/A 03/20/2018   Procedure: INSERTION OF MESH;  Surgeon: Ethyl Lenis, MD;  Location: [facility] OR;  Service: General;  Laterality: N/A;  IRRIGATION AND DEBRIDEMENT ABSCESS Left 08/14/2012   Procedure: IRRIGATION AND DEBRIDEMENT LEFT INGUINAL BOIL ;  Surgeon: Arlena LILLETTE Gal, MD;  Location: [facility] ORS;  Service: Urology;  Laterality: Left;   JOINT REPLACEMENT Right 2017   KNEE ARTHROPLASTY     LEFT HEART CATH AND CORONARY ANGIOGRAPHY N/A 01/24/2019   Procedure: LEFT HEART CATH AND CORONARY ANGIOGRAPHY;  Surgeon: Jordan, Peter M, MD;  Location: [facility] INVASIVE CV LAB;  Service: Cardiovascular;  Laterality: N/A;   LEFT HEART CATHETERIZATION WITH CORONARY ANGIOGRAM N/A 05/26/2014   Procedure: LEFT HEART CATHETERIZATION WITH CORONARY ANGIOGRAM;  Surgeon: Ozell JONETTA Fell, MD;  Location: [facility] CATH LAB;  Service: Cardiovascular;  Laterality: N/A;   LUMBAR LAMINECTOMY/DECOMPRESSION MICRODISCECTOMY Left 09/26/2014   Procedure: Lumbar Laminectomy for resection of synovial cyst  Lumbar five- sacral one left;  Surgeon: Arley Helling, MD;  Location: [facility] NEURO ORS;  Service: Neurosurgery;  Laterality: Left;   MOLE REMOVAL Left 03/20/2018   Procedure: MOLE REMOVAL;  Surgeon: Ethyl Lenis, MD;  Location: [facility] OR;  Service: General;  Laterality: Left;   Oral surg to removed growth from ?sinus  10/2010   ?Dermoid removed by DrRiggs   PACEMAKER INSERTION  04/03/2017   PANCREATIC STENT PLACEMENT  03/19/2021   Procedure:  PANCREATIC STENT PLACEMENT;  Surgeon: Wilhelmenia Aloha Raddle., MD;  Location: [facility];  Service: Gastroenterology;;   POLYPECTOMY     RCA stenting     '05 RCA   REMOVAL OF STONES  11/29/2021   Procedure: REMOVAL OF SLUDGE;  Surgeon: Wilhelmenia Aloha Raddle., MD;  Location: [facility] ENDOSCOPY;  Service: Gastroenterology;;   right toe surgery  Right    Cyst    RIGHT/LEFT HEART CATH AND CORONARY ANGIOGRAPHY N/A 01/15/2017   Procedure: RIGHT/LEFT HEART CATH AND CORONARY ANGIOGRAPHY;  Surgeon: Fell Ozell, MD;  Location: [facility] INVASIVE CV LAB;  Service: Cardiovascular;  Laterality: N/A;   s/p right knee arthroscopy  2005   SPHINCTEROTOMY  03/19/2021   Procedure: SPHINCTEROTOMY;  Surgeon: Mansouraty, Aloha Raddle., MD;  Location: [facility];  Service: Gastroenterology;;   SPINE SURGERY     STENT REMOVAL  11/29/2021   Procedure: STENT REMOVAL;  Surgeon: Wilhelmenia Aloha Raddle., MD;  Location: [facility] ENDOSCOPY;  Service: Gastroenterology;;   SUBMUCOSAL LIFTING INJECTION  03/19/2021   Procedure: SUBMUCOSAL LIFTING INJECTION;  Surgeon: Wilhelmenia Aloha Raddle., MD;  Location: [facility];  Service: Gastroenterology;;   thumb surgery Right    TOTAL KNEE ARTHROPLASTY Right 03/12/2016   Procedure: RIGHT TOTAL KNEE ARTHROPLASTY;  Surgeon: Donnice Car, MD;  Location: [facility];  Service: Orthopedics;  Laterality: Right;   TOTAL KNEE ARTHROPLASTY Left 12/10/2021   Procedure: TOTAL KNEE ARTHROPLASTY;  Surgeon: Melodi Lerner, MD;  Location: [facility];  Service: Orthopedics;  Laterality: Left;   UPPER ESOPHAGEAL ENDOSCOPIC ULTRASOUND (EUS) N/A 03/19/2021   Procedure: UPPER ESOPHAGEAL ENDOSCOPIC ULTRASOUND (EUS);  Surgeon: Wilhelmenia Aloha Raddle., MD;  Location: [facility];  Service: Gastroenterology;  Laterality: N/A;   UPPER GASTROINTESTINAL ENDOSCOPY     VENTRAL HERNIA REPAIR N/A 03/20/2018   Procedure: LAPAROSCOPIC VENTRAL INCISIONAL  HERNIA ERAS PATHWAY;  Surgeon: Ethyl Lenis, MD;  Location: [facility] OR;  Service:  General;  Laterality: N/A;   Social History[1] Social History   Social History Narrative   Married     Immunization History  Administered Date(s) Administered   Fluad Quad(high Dose 65+) 02/18/2019, 02/21/2022   INFLUENZA, HIGH DOSE SEASONAL PF 04/20/2015, 04/15/2016, 02/21/2017   Influenza Split 03/21/2011, 03/03/2023   Influenza Whole 04/06/2009, 04/09/2010   Influenza,inj,Quad  PF,6+ Mos 03/24/2014   Influenza-Unspecified 02/26/2018, 02/18/2019, 02/26/2020   PFIZER(Purple Top)SARS-COV-2 Vaccination 05/27/2019, 06/17/2019, 02/15/2020   Pfizer Covid-19 Vaccine Bivalent Booster 73yrs & up 02/21/2022   Pneumococcal Conjugate-13 11/29/2014   Pneumococcal Polysaccharide-23 06/25/2018   Respiratory Syncytial Virus Vaccine,Recomb Aduvanted(Arexvy) 02/24/2023   Rsv, Mab, [place], 0.5 Ml, Neonate To 24 Mos(Beyfortus) 02/21/2022   Tdap 11/29/2014   Unspecified SARS-COV-2 Vaccination 02/03/2022   Zoster Recombinant(Shingrix) 06/07/2018, 09/26/2018   Zoster, Live 05/20/2006     Objective: Vital Signs: BP 120/70   Pulse 76   Temp 98.2 F (36.8 C)   Resp 16   Ht 5' 8 (1.727 m)   Wt 191 lb 6.4 oz (86.8 kg)   BMI 29.10 kg/m    Physical Exam Vitals and nursing note reviewed.  Constitutional:      Appearance: He is well-developed.  HENT:     Head: Normocephalic and atraumatic.  Eyes:     Conjunctiva/sclera: Conjunctivae normal.     Pupils: Pupils are equal, round, and reactive to light.  Cardiovascular:     Rate and Rhythm: Normal rate and regular rhythm.     Heart sounds: Normal heart sounds.     Comments: Pacemaker Pulmonary:     Effort: Pulmonary effort is normal.     Breath sounds: Normal breath sounds.  Abdominal:     General: Bowel sounds are normal.     Palpations: Abdomen is soft.  Musculoskeletal:     Cervical back: Normal range of motion and neck supple.  Skin:    General: Skin is warm and dry.     Capillary Refill: Capillary refill takes less than 2  seconds.  Neurological:     Mental Status: He is alert and oriented to person, place, and time.  Psychiatric:        Behavior: Behavior normal.      Musculoskeletal Exam: He had limited range of motion of the cervical spine with lateral rotation.  There was no tenderness over thoracic or lumbar spine.  Mild thoracic kyphosis was noted.  Shoulders, elbows, wrist joints with good range of motion.  He had MCP thickening with no synovitis.  PIP and DIP thickening and subluxation of PIP and DIP joints was noted.  Hip joints and knee joints with good range of motion without any warmth swelling or effusion.  There was no tenderness over ankles or MTPs.  Bilateral knee joints were replaced.  CDAI Exam: CDAI Score: -- Patient Global: 20 / 100; Provider Global: 10 / 100 Swollen: --; Tender: -- Joint Exam 06/08/2024   No joint exam has been documented for this visit   There is currently no information documented on the homunculus. Go to the Rheumatology activity and complete the homunculus joint exam.  Investigation: No additional findings.  Imaging: CUP PACEART REMOTE DEVICE CHECK Result Date: 06/07/2024 ICD Scheduled remote reviewed. Normal device function.  Presenting rhythm: AS-BIV paced. Next remote transmission per protocol. - CS, CVRS   Recent Labs: Lab Results  Component Value Date   WBC 6.1 01/07/2024   HGB 14.3 01/07/2024   PLT 252 01/07/2024   NA 138 01/07/2024   K 4.5 01/07/2024   CL 102 01/07/2024   CO2 22 01/07/2024   GLUCOSE 92 01/07/2024   BUN 15 01/07/2024   CREATININE 0.85 01/07/2024   BILITOT 1.1 12/01/2023   ALKPHOS 63 06/10/2023   AST 20 12/01/2023   ALT 19 12/01/2023   PROT 6.3 12/01/2023   ALBUMIN 4.5 06/10/2023   CALCIUM  9.7 01/07/2024  GFRAA 100 11/09/2020   February 23, 2024 T-score -1.7, BMD 0.697 left femoral neck, BMD change -6% in the right total femur Speciality Comments: PLQ eye exam:10/09/2023 WNL [facility] Follow up 6  months   Procedures:  No procedures performed Allergies: Sulfonamide derivatives   Assessment / Plan:     Visit Diagnoses: Seropositive rheumatoid arthritis (HCC) RF-, anti-CCP+; patient complains on ongoing pain and discomfort.  No synovitis was noted on the examination.  No change in treatment was advised.  High risk medication use - Plaquenil  200 mg p.o. daily Monday to Friday.  PLQ eye exam:10/09/2023. -CBC was normal in August 2025.  CMP July 2025.  Will check labs today.  Eye exam was normal in May 2025.  He was advised to annual eye examination to monitor for ocular toxicity.  Will check labs today.  Information about immunization was placed in the AVS.  Plan: CBC with Differential/Platelet, Comprehensive metabolic panel with GFR  Medication monitoring encounter -he is unable to sleep without tramadol .  He states continues to have nocturnal pain which wakes him up.  Tramadol  50 mg p.o. p.o. nightly as needed pain.  UDS & narcotic agreement: 01/07/2024 -prescription refill for tramadol  was given.  He will return in February for UDS.  Plan: traMADol  (ULTRAM ) 50 MG tablet  Primary osteoarthritis of both hands-bilateral severe PIP and DIP thickening with subluxation of PIP and DIP joints was noted.  Joint protection was discussed.  Status post bilateral knee replacements - LTR July 2023.RTR October 2017.  Doing well.  Primary osteoarthritis of both feet - Followed by Dr. Kit.  He has a stiffness in his feet.  Chronic left SI joint pain-currently asymptomatic.  He has intermittent pain.  Trochanteric bursitis, left hip-IT band stretches were discussed.  Spondylosis of lumbar spine-chronic low back discomfort.  Core strength exercise were emphasized.  Osteopenia of multiple sites - May 02, 2021 DEXA scan showed T-score of -1.6 BMD 0.706 in the left femoral neck.February 23, 2024 T-score -1.7, BMD 0.697 left femoral neck, BMD change -6% in the right total femur.  DEXA scan results were  discussed with the patient.  I discussed the option of adding bisphosphonates or observing for now.  Patient would like to wait and observe.  Calcium  rich diet and vitamin D  was advised.  I will obtain following labs today.- Plan: Parathyroid  hormone, intact (no Ca), Phosphorus, Serum protein electrophoresis with reflex  Vitamin D  deficiency -patient states he has been taking vitamin D  supplement.  Will check vitamin D  level today.  Plan: VITAMIN D  25 Hydroxy (Vit-D Deficiency, Fractures)  Other medical problems listed as follows:  History of coronary artery disease  History of CHF (congestive heart failure)  History of hypertension-blood pressure was normal at 120/70.  Pacemaker  History of gastric polyp  History of hyperlipidemia  History of sleep apnea  History of gastroesophageal reflux (GERD)  History of colon polyps  History of BPH  Other fatigue - Plan: TSH  Orders: Orders Placed This Encounter  Procedures   CBC with Differential/Platelet   Comprehensive metabolic panel with GFR   Parathyroid  hormone, intact (no Ca)   VITAMIN D  25 Hydroxy (Vit-D Deficiency, Fractures)   Phosphorus   Serum protein electrophoresis with reflex   TSH   Meds ordered this encounter  Medications   traMADol  (ULTRAM ) 50 MG tablet    Sig: Take 1 tablet (50 mg total) by mouth at bedtime as needed.    Dispense:  30 tablet  Refill:  0     Follow-Up Instructions: Return in about 5 months (around 11/06/2024) for Rheumatoid arthritis, Osteoarthritis.   Maya Nash, MD  Note - This record has been created using Animal nutritionist.  Chart creation errors have been sought, but may not always  have been located. Such creation errors do not reflect on  the standard of medical care.     [1]  Social History Tobacco Use   Smoking status: Former    Current packs/day: 0.00    Types: Cigarettes    Quit date: 05/20/1990    Years since quitting: 34.0    Passive exposure: Never    Smokeless tobacco: Never   Tobacco comments:    previous 30 pack year history  Vaping Use   Vaping status: Never Used  Substance Use Topics   Alcohol  use: Yes    Comment: rare   Drug use: Never   "

## 2024-06-02 ENCOUNTER — Encounter

## 2024-06-04 ENCOUNTER — Ambulatory Visit (INDEPENDENT_AMBULATORY_CARE_PROVIDER_SITE_OTHER)

## 2024-06-04 DIAGNOSIS — I428 Other cardiomyopathies: Secondary | ICD-10-CM

## 2024-06-07 LAB — CUP PACEART REMOTE DEVICE CHECK
Battery Remaining Longevity: 73 mo
Battery Voltage: 2.98 V
Brady Statistic RV Percent Paced: 48.86 %
Date Time Interrogation Session: 20260116042154
HighPow Impedance: 64 Ohm
Implantable Lead Connection Status: 753985
Implantable Lead Connection Status: 753985
Implantable Lead Connection Status: 753985
Implantable Lead Implant Date: 20181108
Implantable Lead Implant Date: 20181108
Implantable Lead Implant Date: 20181108
Implantable Lead Location: 753858
Implantable Lead Location: 753859
Implantable Lead Location: 753860
Implantable Lead Model: 4298
Implantable Lead Model: 5076
Implantable Pulse Generator Implant Date: 20250903
Lead Channel Impedance Value: 1159 Ohm
Lead Channel Impedance Value: 1235 Ohm
Lead Channel Impedance Value: 1311 Ohm
Lead Channel Impedance Value: 323 Ohm
Lead Channel Impedance Value: 418 Ohm
Lead Channel Impedance Value: 437 Ohm
Lead Channel Impedance Value: 532 Ohm
Lead Channel Impedance Value: 627 Ohm
Lead Channel Impedance Value: 684 Ohm
Lead Channel Impedance Value: 684 Ohm
Lead Channel Impedance Value: 722 Ohm
Lead Channel Impedance Value: 779 Ohm
Lead Channel Impedance Value: 855 Ohm
Lead Channel Pacing Threshold Amplitude: 0.5 V
Lead Channel Pacing Threshold Amplitude: 0.5 V
Lead Channel Pacing Threshold Amplitude: 3 V
Lead Channel Pacing Threshold Pulse Width: 0.4 ms
Lead Channel Pacing Threshold Pulse Width: 0.4 ms
Lead Channel Pacing Threshold Pulse Width: 1 ms
Lead Channel Sensing Intrinsic Amplitude: 11.3 mV
Lead Channel Sensing Intrinsic Amplitude: 2.5 mV
Lead Channel Setting Pacing Amplitude: 1.5 V
Lead Channel Setting Pacing Amplitude: 2 V
Lead Channel Setting Pacing Amplitude: 3.5 V
Lead Channel Setting Pacing Pulse Width: 0.4 ms
Lead Channel Setting Pacing Pulse Width: 1 ms
Lead Channel Setting Sensing Sensitivity: 0.3 mV
Zone Setting Status: 755011
Zone Setting Status: 755011
Zone Setting Status: 755011

## 2024-06-08 ENCOUNTER — Encounter: Payer: Self-pay | Admitting: Rheumatology

## 2024-06-08 ENCOUNTER — Ambulatory Visit: Attending: Rheumatology | Admitting: Rheumatology

## 2024-06-08 ENCOUNTER — Ambulatory Visit: Payer: Self-pay | Admitting: Cardiology

## 2024-06-08 VITALS — BP 120/70 | HR 76 | Temp 98.2°F | Resp 16 | Ht 68.0 in | Wt 191.4 lb

## 2024-06-08 DIAGNOSIS — M8589 Other specified disorders of bone density and structure, multiple sites: Secondary | ICD-10-CM

## 2024-06-08 DIAGNOSIS — R5383 Other fatigue: Secondary | ICD-10-CM

## 2024-06-08 DIAGNOSIS — Z96653 Presence of artificial knee joint, bilateral: Secondary | ICD-10-CM

## 2024-06-08 DIAGNOSIS — Z8679 Personal history of other diseases of the circulatory system: Secondary | ICD-10-CM

## 2024-06-08 DIAGNOSIS — M47816 Spondylosis without myelopathy or radiculopathy, lumbar region: Secondary | ICD-10-CM | POA: Diagnosis not present

## 2024-06-08 DIAGNOSIS — Z8639 Personal history of other endocrine, nutritional and metabolic disease: Secondary | ICD-10-CM

## 2024-06-08 DIAGNOSIS — M19041 Primary osteoarthritis, right hand: Secondary | ICD-10-CM

## 2024-06-08 DIAGNOSIS — M19071 Primary osteoarthritis, right ankle and foot: Secondary | ICD-10-CM | POA: Diagnosis not present

## 2024-06-08 DIAGNOSIS — E559 Vitamin D deficiency, unspecified: Secondary | ICD-10-CM

## 2024-06-08 DIAGNOSIS — Z87438 Personal history of other diseases of male genital organs: Secondary | ICD-10-CM

## 2024-06-08 DIAGNOSIS — M19072 Primary osteoarthritis, left ankle and foot: Secondary | ICD-10-CM

## 2024-06-08 DIAGNOSIS — M7062 Trochanteric bursitis, left hip: Secondary | ICD-10-CM

## 2024-06-08 DIAGNOSIS — Z860100 Personal history of colon polyps, unspecified: Secondary | ICD-10-CM

## 2024-06-08 DIAGNOSIS — M533 Sacrococcygeal disorders, not elsewhere classified: Secondary | ICD-10-CM | POA: Diagnosis not present

## 2024-06-08 DIAGNOSIS — M059 Rheumatoid arthritis with rheumatoid factor, unspecified: Secondary | ICD-10-CM

## 2024-06-08 DIAGNOSIS — Z79899 Other long term (current) drug therapy: Secondary | ICD-10-CM

## 2024-06-08 DIAGNOSIS — Z950 Presence of cardiac pacemaker: Secondary | ICD-10-CM

## 2024-06-08 DIAGNOSIS — Z8719 Personal history of other diseases of the digestive system: Secondary | ICD-10-CM

## 2024-06-08 DIAGNOSIS — G8929 Other chronic pain: Secondary | ICD-10-CM

## 2024-06-08 DIAGNOSIS — Z5181 Encounter for therapeutic drug level monitoring: Secondary | ICD-10-CM | POA: Diagnosis not present

## 2024-06-08 DIAGNOSIS — M19042 Primary osteoarthritis, left hand: Secondary | ICD-10-CM

## 2024-06-08 DIAGNOSIS — Z8669 Personal history of other diseases of the nervous system and sense organs: Secondary | ICD-10-CM

## 2024-06-08 MED ORDER — TRAMADOL HCL 50 MG PO TABS: 50.0000 mg | ORAL_TABLET | Freq: Every evening | ORAL | 0 refills | Status: AC | PRN

## 2024-06-08 NOTE — Patient Instructions (Signed)

## 2024-06-09 ENCOUNTER — Ambulatory Visit: Payer: Self-pay | Admitting: Rheumatology

## 2024-06-09 ENCOUNTER — Other Ambulatory Visit: Payer: Self-pay | Admitting: Cardiovascular Disease

## 2024-06-09 NOTE — Progress Notes (Signed)
 CBC and CMP normal, TSH normal, PTH normal, vitamin D  normal, phosphorus normal, SPEP pending

## 2024-06-10 LAB — TSH: TSH: 2.51 m[IU]/L (ref 0.40–4.50)

## 2024-06-10 LAB — CBC WITH DIFFERENTIAL/PLATELET
Absolute Lymphocytes: 1779 {cells}/uL (ref 850–3900)
Absolute Monocytes: 672 {cells}/uL (ref 200–950)
Basophils Absolute: 51 {cells}/uL (ref 0–200)
Basophils Relative: 0.8 %
Eosinophils Absolute: 128 {cells}/uL (ref 15–500)
Eosinophils Relative: 2 %
HCT: 40.7 % (ref 39.4–51.1)
Hemoglobin: 13.7 g/dL (ref 13.2–17.1)
MCH: 30.1 pg (ref 27.0–33.0)
MCHC: 33.7 g/dL (ref 31.6–35.4)
MCV: 89.5 fL (ref 81.4–101.7)
MPV: 10.4 fL (ref 7.5–12.5)
Monocytes Relative: 10.5 %
Neutro Abs: 3770 {cells}/uL (ref 1500–7800)
Neutrophils Relative %: 58.9 %
Platelets: 274 Thousand/uL (ref 140–400)
RBC: 4.55 Million/uL (ref 4.20–5.80)
RDW: 12.7 % (ref 11.0–15.0)
Total Lymphocyte: 27.8 %
WBC: 6.4 Thousand/uL (ref 3.8–10.8)

## 2024-06-10 LAB — PROTEIN ELECTROPHORESIS, SERUM, WITH REFLEX
Albumin ELP: 4.2 g/dL (ref 3.8–4.8)
Alpha 1: 0.3 g/dL (ref 0.2–0.3)
Alpha 2: 0.7 g/dL (ref 0.5–0.9)
Beta 2: 0.4 g/dL (ref 0.2–0.5)
Beta Globulin: 0.4 g/dL (ref 0.4–0.6)
Gamma Globulin: 0.8 g/dL (ref 0.8–1.7)
Total Protein: 6.9 g/dL (ref 6.1–8.1)

## 2024-06-10 LAB — COMPREHENSIVE METABOLIC PANEL WITH GFR
AG Ratio: 1.9 (calc) (ref 1.0–2.5)
ALT: 18 U/L (ref 9–46)
AST: 19 U/L (ref 10–35)
Albumin: 4.5 g/dL (ref 3.6–5.1)
Alkaline phosphatase (APISO): 70 U/L (ref 35–144)
BUN: 18 mg/dL (ref 7–25)
CO2: 29 mmol/L (ref 20–32)
Calcium: 10.2 mg/dL (ref 8.6–10.3)
Chloride: 103 mmol/L (ref 98–110)
Creat: 0.8 mg/dL (ref 0.70–1.22)
Globulin: 2.4 g/dL (ref 1.9–3.7)
Glucose, Bld: 88 mg/dL (ref 65–99)
Potassium: 4.7 mmol/L (ref 3.5–5.3)
Sodium: 139 mmol/L (ref 135–146)
Total Bilirubin: 0.9 mg/dL (ref 0.2–1.2)
Total Protein: 6.9 g/dL (ref 6.1–8.1)
eGFR: 89 mL/min/1.73m2

## 2024-06-10 LAB — PARATHYROID HORMONE, INTACT (NO CA): PTH: 30 pg/mL (ref 16–77)

## 2024-06-10 LAB — PHOSPHORUS: Phosphorus: 3.6 mg/dL (ref 2.1–4.3)

## 2024-06-10 LAB — VITAMIN D 25 HYDROXY (VIT D DEFICIENCY, FRACTURES): Vit D, 25-Hydroxy: 42 ng/mL (ref 30–100)

## 2024-06-10 NOTE — Progress Notes (Signed)
 Remote ICD Transmission

## 2024-06-11 NOTE — Progress Notes (Signed)
 Phosphorus normal, vitamin D  normal, PTH normal, TSH normal, SPEP normal, CMP normal, CBC normal.  Patient will let us  know when he wants to start on bisphosphonates.

## 2024-06-15 ENCOUNTER — Encounter: Admitting: Family

## 2024-06-16 ENCOUNTER — Other Ambulatory Visit: Payer: Self-pay | Admitting: Rheumatology

## 2024-06-16 ENCOUNTER — Encounter: Payer: Self-pay | Admitting: Gastroenterology

## 2024-06-16 ENCOUNTER — Other Ambulatory Visit: Payer: Self-pay | Admitting: Physician Assistant

## 2024-06-16 ENCOUNTER — Ambulatory Visit (INDEPENDENT_AMBULATORY_CARE_PROVIDER_SITE_OTHER): Admitting: Gastroenterology

## 2024-06-16 VITALS — BP 108/64 | HR 59 | Ht 68.0 in | Wt 188.0 lb

## 2024-06-16 DIAGNOSIS — R079 Chest pain, unspecified: Secondary | ICD-10-CM

## 2024-06-16 DIAGNOSIS — R0789 Other chest pain: Secondary | ICD-10-CM

## 2024-06-16 DIAGNOSIS — M1380 Other specified arthritis, unspecified site: Secondary | ICD-10-CM

## 2024-06-16 DIAGNOSIS — Z86018 Personal history of other benign neoplasm: Secondary | ICD-10-CM

## 2024-06-16 DIAGNOSIS — K219 Gastro-esophageal reflux disease without esophagitis: Secondary | ICD-10-CM

## 2024-06-16 DIAGNOSIS — K8020 Calculus of gallbladder without cholecystitis without obstruction: Secondary | ICD-10-CM | POA: Diagnosis not present

## 2024-06-16 NOTE — Telephone Encounter (Signed)
 Last Fill: 03/22/2024  Eye exam: 10/09/2023   Labs: 06/08/2024 Phosphorus normal, vitamin D  normal, PTH normal, TSH normal, SPEP normal, CMP normal, CBC normal.   Next Visit: 11/09/2024  Last Visit: 06/08/2024  DX: Seropositive rheumatoid arthritis   Current Dose per office note on 06/08/2024: Plaquenil  200 mg p.o. daily Monday to Friday.   Okay to refill Plaquenil ?

## 2024-06-16 NOTE — Progress Notes (Addendum)
 "    06/16/2024 [name] [id number] 01-03-1946  Discussed the use of AI scribe software for clinical note transcription with the patient, who gave verbal consent to proceed.  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  On Christmas night, he awoke with severe chest pain radiating to the area between his shoulder blades, accompanied by nausea, dry heaving without emesis, and gas. Symptoms persisted for approximately three hours before he presented to [facility]. Nitroglycerin  and five baby aspirins resolved the chest pain, but back pain persisted and was managed with a pain patch. Back pain resolved over the next couple of days, and there has been no recurrence of pain since discharge.  CT scan during admission revealed gallstones (being sent for scanning), consistent with prior imaging in 2024. No prior episodes of similar pain except for one episode 4-5 years ago, attributed to pericardial inflammation/myocarditis. He reports ongoing mild, diffuse abdominal tenderness when he presses on his own abdomen, without abdominal pain. Greasy foods or overeating trigger nausea and a sensation of wanting to vomit, prompting dietary modifications.  History of precancerous polyps/duodenal adenoma removed. 2022 s/p papillectomy.  EGD in October 2024 showed no recurrent polyps in the ampullary area. Last colonoscopy in 2021 was normal, with biopsies throughout the colon. No lower gastrointestinal symptoms such as changes in bowel habits or rectal bleeding.  He takes daily medication for acid reflux and does not experience frequent heartburn or indigestion. Certain foods can trigger mild symptoms, but he denies daily symptoms.   EGD 02/2023: - No  gross lesions in the entire esophagus. Z- line irregular, 39 cm from the incisors. - 1 cm hiatal hernia. - Multiple gastric polyps. No other gross lesions in the entire stomach. - No gross lesions in the duodenal bulb, in the first portion of the duodenum and in the second portion of the duodenum. - Patent biliary sphincterotomy and pancreatic sphincterotomy, characterized by healthy appearing mucosa was found.   Past Medical History:  Diagnosis Date   AICD (automatic cardioverter/defibrillator) present    Allergic rhinitis    Allergy    Anginal pain 05/26/2014   chest pain after chasing dog   Atypical nevus of back 04/27/2003   moderate - mid lower back   Basal cell carcinoma 01/26/2008   right cheek - MOHs   CAD (coronary artery disease)    hx of stent- 2005 RCA   Cataract    beginning stage both eyes   CHF (congestive heart failure) (HCC)    pacemaker Medtronic     Chronic back pain    intermittent   Constipation    Dizziness    Dysrhythmia    right bundle branch block    Esophageal stricture    GERD (gastroesophageal reflux disease)    Heart murmur    Hemoptysis    Hiatal hernia    History of colonic polyps    hyperplastic   HTN (hypertension)    Hyperlipidemia    Hypertrophy of prostate with urinary obstruction and other lower urinary tract symptoms (LUTS)    OA (osteoarthritis)    OSA (obstructive sleep apnea)    cpap- 10    Other specified disorder of stomach and duodenum    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  Pericarditis 07/06/2019   Pneumonia    Pre-diabetes    no medications   Shortness of breath    with exertion   Sleep apnea    cpap   Testicular hypofunction    Past Surgical History:  Procedure Laterality Date   BILIARY STENT PLACEMENT N/A 03/19/2021   Procedure: BILIARY STENT PLACEMENT;  Surgeon: Wilhelmenia Aloha Raddle., MD;  Location: [facility];  Service: Gastroenterology;  Laterality: N/A;   BIV ICD  GENERATOR CHANGEOUT N/A 01/21/2024   Procedure: BIV ICD GENERATOR CHANGEOUT;  Surgeon: Inocencio Soyla Lunger, MD;  Location: [facility] INVASIVE CV LAB;  Service: Cardiovascular;  Laterality: N/A;   BIV ICD INSERTION CRT-D N/A 03/27/2017   Procedure: BIV ICD INSERTION CRT-D;  Surgeon: Inocencio Soyla Lunger, MD;  Location: [facility] INVASIVE CV LAB;  Service: Cardiovascular;  Laterality: N/A;   CARDIAC CATHETERIZATION     '05, last 2009, showing patent RCA stent   COLONOSCOPY  12/2007   HYPERPLASTIC POLYP   COLONOSCOPY  12/2019   COLONOSCOPY WITH PROPOFOL  N/A 06/02/2014   Procedure: COLONOSCOPY WITH PROPOFOL ;  Surgeon: Toribio SHAUNNA Cedar, MD;  Location: [facility];  Service: Endoscopy;  Laterality: N/A;   CORONARY ANGIOPLASTY  08/2003   ENDOSCOPIC MUCOSAL RESECTION  03/19/2021   Procedure: ENDOSCOPIC MUCOSAL RESECTION, ampullectomy;  Surgeon: Wilhelmenia Aloha Raddle., MD;  Location: [facility];  Service: Gastroenterology;;   ENDOSCOPIC RETROGRADE CHOLANGIOPANCREATOGRAPHY (ERCP) WITH PROPOFOL  N/A 03/19/2021   Procedure: ENDOSCOPIC RETROGRADE CHOLANGIOPANCREATOGRAPHY (ERCP) WITH PROPOFOL ;  Surgeon: Wilhelmenia Aloha Raddle., MD;  Location: [facility];  Service: Gastroenterology;  Laterality: N/A;   ENDOSCOPIC RETROGRADE CHOLANGIOPANCREATOGRAPHY (ERCP) WITH PROPOFOL  N/A 11/29/2021   Procedure: ENDOSCOPIC RETROGRADE CHOLANGIOPANCREATOGRAPHY (ERCP) WITH PROPOFOL ;  Surgeon: Wilhelmenia Aloha Raddle., MD;  Location: [facility] ENDOSCOPY;  Service: Gastroenterology;  Laterality: N/A;   ESOPHAGOGASTRODUODENOSCOPY (EGD) WITH PROPOFOL  N/A 06/02/2014   Procedure: ESOPHAGOGASTRODUODENOSCOPY (EGD) WITH PROPOFOL ;  Surgeon: Toribio SHAUNNA Cedar, MD;  Location: [facility];  Service: Endoscopy;  Laterality: N/A;   ESOPHAGOGASTRODUODENOSCOPY (EGD) WITH PROPOFOL  N/A 03/19/2021   Procedure: ESOPHAGOGASTRODUODENOSCOPY (EGD) WITH PROPOFOL ;  Surgeon: Wilhelmenia Aloha Raddle., MD;  Location: [facility];  Service: Gastroenterology;  Laterality: N/A;    ESOPHAGOGASTRODUODENOSCOPY (EGD) WITH PROPOFOL  N/A 03/23/2021   Procedure: ESOPHAGOGASTRODUODENOSCOPY (EGD) WITH PROPOFOL ;  Surgeon: Aneita Gwendlyn DASEN, MD;  Location: [facility];  Service: Endoscopy;  Laterality: N/A;   ESOPHAGOGASTRODUODENOSCOPY (EGD) WITH PROPOFOL  N/A 03/13/2023   Procedure: ESOPHAGOGASTRODUODENOSCOPY (EGD) WITH PROPOFOL ;  Surgeon: Wilhelmenia Aloha Raddle., MD;  Location: [facility];  Service: Gastroenterology;  Laterality: N/A;  WITH SIDE SCOPE   HERNIA REPAIR     hernia surgery x 3     Bilateral Inguinal, Umbicial   INSERTION OF MESH N/A 03/20/2018   Procedure: INSERTION OF MESH;  Surgeon: Ethyl Lenis, MD;  Location: [facility] OR;  Service: General;  Laterality: N/A;   IRRIGATION AND DEBRIDEMENT ABSCESS Left 08/14/2012   Procedure: IRRIGATION AND DEBRIDEMENT LEFT INGUINAL BOIL ;  Surgeon: Arlena LILLETTE Gal, MD;  Location: [facility];  Service: Urology;  Laterality: Left;   JOINT REPLACEMENT Right 2017   KNEE ARTHROPLASTY     LEFT HEART CATH AND CORONARY ANGIOGRAPHY N/A 01/24/2019   Procedure: LEFT HEART CATH AND CORONARY ANGIOGRAPHY;  Surgeon: Jordan, Peter M, MD;  Location: [facility] INVASIVE CV LAB;  Service: Cardiovascular;  Laterality: N/A;   LEFT HEART CATHETERIZATION WITH CORONARY ANGIOGRAM N/A 05/26/2014   Procedure: LEFT HEART CATHETERIZATION WITH CORONARY ANGIOGRAM;  Surgeon: Ozell JONETTA Fell, MD;  Location: [facility] CATH LAB;  Service: Cardiovascular;  Laterality: N/A;  LUMBAR LAMINECTOMY/DECOMPRESSION MICRODISCECTOMY Left 09/26/2014   Procedure: Lumbar Laminectomy for resection of synovial cyst  Lumbar five- sacral one left;  Surgeon: Arley Helling, MD;  Location: [facility]URO ORS;  Service: Neurosurgery;  Laterality: Left;   MOLE REMOVAL Left 03/20/2018   Procedure: MOLE REMOVAL;  Surgeon: Ethyl Lenis, MD;  Location: [facility] OR;  Service: General;  Laterality: Left;   Oral surg to removed growth from ?sinus  10/2010   ?Dermoid removed by DrRiggs   PACEMAKER INSERTION  04/03/2017    PANCREATIC STENT PLACEMENT  03/19/2021   Procedure: PANCREATIC STENT PLACEMENT;  Surgeon: Wilhelmenia Aloha Raddle., MD;  Location: [facility];  Service: Gastroenterology;;   POLYPECTOMY     RCA stenting     '05 RCA   REMOVAL OF STONES  11/29/2021   Procedure: REMOVAL OF SLUDGE;  Surgeon: Wilhelmenia Aloha Raddle., MD;  Location: [facility] ENDOSCOPY;  Service: Gastroenterology;;   right toe surgery  Right    Cyst    RIGHT/LEFT HEART CATH AND CORONARY ANGIOGRAPHY N/A 01/15/2017   Procedure: RIGHT/LEFT HEART CATH AND CORONARY ANGIOGRAPHY;  Surgeon: Wonda Sharper, MD;  Location: [facility] INVASIVE CV LAB;  Service: Cardiovascular;  Laterality: N/A;   s/p right knee arthroscopy  2005   SPHINCTEROTOMY  03/19/2021   Procedure: SPHINCTEROTOMY;  Surgeon: Mansouraty, Aloha Raddle., MD;  Location: [facility];  Service: Gastroenterology;;   SPINE SURGERY     STENT REMOVAL  11/29/2021   Procedure: STENT REMOVAL;  Surgeon: Wilhelmenia Aloha Raddle., MD;  Location: [facility] ENDOSCOPY;  Service: Gastroenterology;;   SUBMUCOSAL LIFTING INJECTION  03/19/2021   Procedure: SUBMUCOSAL LIFTING INJECTION;  Surgeon: Wilhelmenia Aloha Raddle., MD;  Location: [facility];  Service: Gastroenterology;;   thumb surgery Right    TOTAL KNEE ARTHROPLASTY Right 03/12/2016   Procedure: RIGHT TOTAL KNEE ARTHROPLASTY;  Surgeon: Donnice Car, MD;  Location: [facility];  Service: Orthopedics;  Laterality: Right;   TOTAL KNEE ARTHROPLASTY Left 12/10/2021   Procedure: TOTAL KNEE ARTHROPLASTY;  Surgeon: Melodi Lerner, MD;  Location: [facility];  Service: Orthopedics;  Laterality: Left;   UPPER ESOPHAGEAL ENDOSCOPIC ULTRASOUND (EUS) N/A 03/19/2021   Procedure: UPPER ESOPHAGEAL ENDOSCOPIC ULTRASOUND (EUS);  Surgeon: Wilhelmenia Aloha Raddle., MD;  Location: [facility];  Service: Gastroenterology;  Laterality: N/A;   UPPER GASTROINTESTINAL ENDOSCOPY     VENTRAL HERNIA REPAIR N/A 03/20/2018   Procedure: LAPAROSCOPIC VENTRAL INCISIONAL  HERNIA ERAS PATHWAY;   Surgeon: Ethyl Lenis, MD;  Location: [facility] OR;  Service: General;  Laterality: N/A;    reports that he quit smoking about 34 years ago. His smoking use included cigarettes. He has never been exposed to tobacco smoke. He has never used smokeless tobacco. He reports that he does not currently use alcohol . He reports that he does not use drugs. family history includes Arthritis in his brother and daughter; COPD in his brother; Cancer in his brother; Coronary artery disease in his father; Diabetes in his father; Heart disease in his father and mother; Heart failure in his brother; Hypertension in an other family member; Stomach cancer in his paternal grandmother; Sudden death in his father. Allergies[1]    Outpatient Encounter Medications as of 06/16/2024  Medication Sig   acetaminophen  (TYLENOL ) 650 MG CR tablet Take 650 mg by mouth in the morning and at bedtime.   albuterol  (VENTOLIN  HFA) 108 (90 Base) MCG/ACT inhaler Inhale 2 puffs into the lungs every 6 (six) hours as needed for wheezing or shortness of breath.   apixaban  (ELIQUIS ) 5 MG TABS tablet Take 1 tablet (5  mg total) by mouth 2 (two) times daily.   atorvastatin  (LIPITOR) 20 MG tablet TAKE 1 TABLET BY MOUTH DAILY   b complex vitamins tablet Take 1 tablet by mouth daily with lunch.   betamethasone dipropionate 0.05 % cream Apply 1 Application topically daily as needed (Dermatitis).   carvedilol  (COREG ) 25 MG tablet TAKE 1 TABLET BY MOUTH TWICE DAILY   cholecalciferol (VITAMIN D3) 25 MCG (1000 UNIT) tablet Take 1,000 Units by mouth daily.   Coenzyme Q10 (CO Q 10 PO) Take 200 mg by mouth daily with lunch.   Flaxseed, Linseed, (FLAXSEED OIL PO) Take 1 capsule by mouth daily. With Omega 3   Glucosamine HCl (GLUCOSAMINE PO) Take 1,500 mg by mouth every evening.   hydrocortisone  2.5 % cream Apply 1 application topically 2 (two) times daily as needed (rash on nose).    hydroxychloroquine  (PLAQUENIL ) 200 MG tablet TAKE 1 TABLET BY MOUTH DAILY MONDAY  THROUGH FRIDAY, SKIPPING SATURDAY AND SUNDAY   Lutein  20 MG TABS Take 20 mg by mouth daily.   Multiple Vitamin (MULTIVITAMIN) capsule Take 1 capsule by mouth daily.     nitroGLYCERIN  (NITROSTAT ) 0.4 MG SL tablet Dissolve 1 tablet under the tongue every 5 minutes as needed for chest pain. Max of 3 doses, then 911. (Patient taking differently: Place 0.4 mg under the tongue every 5 (five) minutes as needed. Dissolve 1 tablet under the tongue every 5 minutes as needed for chest pain. Max of 3 doses, then 911.)   NON FORMULARY CPAP machine with sleep.   pantoprazole  (PROTONIX ) 40 MG tablet Take 1 tablet (40 mg total) by mouth daily. before breakfast   polyethylene glycol (MIRALAX  / GLYCOLAX ) 17 g packet Take 17 g by mouth daily as needed for mild constipation.   Probiotic Product (PROBIOTIC DAILY PO) Take 1 capsule by mouth daily with lunch.    RESTASIS  0.05 % ophthalmic emulsion Place 1 drop into both eyes 2 (two) times daily as needed (dry eyes).   sacubitril -valsartan  (ENTRESTO ) 49-51 MG TAKE 1 TABLET BY MOUTH TWICE DAILY   Saw Palmetto, Serenoa repens, (SAW PALMETTO PO) Take 450 mg by mouth every evening.   spironolactone  (ALDACTONE ) 25 MG tablet TAKE 1 TABLET(25 MG) BY MOUTH DAILY   tamsulosin  (FLOMAX ) 0.4 MG CAPS capsule Take 0.4 mg by mouth daily.   Testosterone  20 % CREA Apply 2 mLs topically daily. Rub on shoulder   tobramycin-dexamethasone  (TOBRADEX) ophthalmic ointment Place 1 Application into both eyes as needed (Irritation).   traMADol  (ULTRAM ) 50 MG tablet Take 1 tablet (50 mg total) by mouth at bedtime as needed.   TURMERIC PO Take 1 tablet by mouth daily with lunch.    vitamin C (ASCORBIC ACID) 500 MG tablet Take 500 mg by mouth daily.   zinc gluconate 50 MG tablet Take 50 mg by mouth daily.   [DISCONTINUED] omeprazole  (PRILOSEC) 40 MG capsule TAKE 1 CAPSULE(40 MG) BY MOUTH IN THE MORNING AND AT BEDTIME   No facility-administered encounter medications on file as of 06/16/2024.     REVIEW OF SYSTEMS  : All other systems reviewed and negative except where noted in the History of Present Illness.   PHYSICAL EXAM: BP 108/64   Pulse (!) 59   Ht 5' 8 (1.727 m)   Wt 188 lb (85.3 kg)   SpO2 95%   BMI 28.59 kg/m  General: Well developed white male in no acute distress Head: Normocephalic and atraumatic Eyes:  Sclerae anicteric, conjunctiva pink. Ears: Normal auditory acuity Lungs: Clear  throughout to auscultation; no W/R/R. Heart: Regular rate and rhythm; no M/R/G. Abdomen: Soft, non-distended.  BS present. Non-tender. Musculoskeletal: Symmetrical with no gross deformities  Skin: No lesions on visible extremities Neurological: Alert oriented x 4, grossly non-focal Psychological:  Alert and cooperative. Normal mood and affect  Assessment & Plan Chest pain Episode of chest pain radiating to his back on 05/13/2024 that felt reminiscent of previous cardiac issues.  Admitted for observation and workup is negative for cardiac source.  CT scan of the abdomen pelvis showed cholelithiasis, normal LFTs.  Pain resolved with ASA and nitroglycerin .  No recurrence at this time.  Unsure of the source.  ? Gallstone related vs reflux vs other.   -Patient agrees was patient for now.  Gallstones Chronic, small-volume calcified gallstones confirmed on prior imaging and gallstones confirmed on recent imaging; currently asymptomatic with no evidence of biliary obstruction or acute cholecystitis. Surgical intervention is not indicated given absence of classic or recurrent symptoms. - Reviewed CT scan confirming gallstones. - Discussed absence of biliary obstruction and normal liver enzymes. - Recommended dietary modifications to avoid greasy foods and overeating. - Advised observation with no immediate intervention. - Instructed him to notify the office via MyChart or seek hospital care if severe pain recurs.  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 No current symptoms attributable to prior polyps and no indication for early repeat procedures. - Reviewed prior endoscopic studies. - Confirmed recall for EGD in October 2026.  Gastroesophageal reflux disease Chronic GERD well-controlled on daily medication; currently asymptomatic with respect to reflux. Occasional postprandial nausea after greasy meals, likely multifactorial and not clearly related to GERD. - Advised continuation of current reflux medication regimen. - Recommended dietary modifications: avoid eating late at night, heavy or greasy meals, and lying down immediately after eating. - Instructed him to monitor for recurrent symptoms and report if he develops.  CAD  Chronic anticoagulation with Eliquis   CC:  [name], MD        [1]  Allergies Allergen Reactions   Sulfonamide Derivatives Rash        "

## 2024-06-16 NOTE — Patient Instructions (Signed)
 Follow up as needed.   Upper endoscopy due 02/2025.   _______________________________________________________  If your blood pressure at your visit was 140/90 or greater, please contact your primary care physician to follow up on this.  _______________________________________________________  If you are age 81 or older, your body mass index should be between 23-30. Your Body mass index is 28.59 kg/m. If this is out of the aforementioned range listed, please consider follow up with your Primary Care Provider.  If you are age 39 or younger, your body mass index should be between 19-25. Your Body mass index is 28.59 kg/m. If this is out of the aformentioned range listed, please consider follow up with your Primary Care Provider.   ________________________________________________________  The [facility] providers would like to encourage you to use MYCHART to communicate with providers for non-urgent requests or questions.  Due to long hold times on the telephone, sending your provider a message by [facility] may be a faster and more efficient way to get a response.  Please allow 48 business hours for a response.  Please remember that this is for non-urgent requests.  _______________________________________________________  [facility] is using a team-based approach to care.  Your team is made up of your doctor and two to three APPS. Our APPS (Nurse Practitioners and Physician Assistants) work with your physician to ensure care continuity for you. They are fully qualified to address your health concerns and develop a treatment plan. They communicate directly with your gastroenterologist to care for you. Seeing the Advanced Practice Practitioners on your physician's team can help you by facilitating care more promptly, often allowing for earlier appointments, access to diagnostic testing, procedures, and other specialty referrals.

## 2024-06-17 NOTE — Progress Notes (Signed)
 Attending Physician's Attestation   I have reviewed the chart.   I agree with the Advanced Practitioner's note, impression, and recommendations with any updates as below.    Corliss Parish, MD [facility] Advanced Endoscopy Office # [phone]

## 2024-06-17 NOTE — Progress Notes (Signed)
 31 day ICM Remote transmission canceled due to [facility] clinic is on hold until further notice.  91 day remote monitoring will continue per protocol.

## 2024-06-21 ENCOUNTER — Ambulatory Visit

## 2024-08-02 ENCOUNTER — Encounter

## 2024-08-05 ENCOUNTER — Ambulatory Visit: Admitting: Nurse Practitioner

## 2024-08-31 ENCOUNTER — Encounter: Admitting: Family Medicine

## 2024-09-02 ENCOUNTER — Encounter: Admitting: Family Medicine

## 2024-09-03 ENCOUNTER — Encounter

## 2024-09-06 ENCOUNTER — Ambulatory Visit: Admitting: Cardiovascular Disease

## 2024-09-08 ENCOUNTER — Encounter: Admitting: Family Medicine

## 2024-11-01 ENCOUNTER — Encounter

## 2024-11-09 ENCOUNTER — Ambulatory Visit: Admitting: Rheumatology

## 2024-12-03 ENCOUNTER — Encounter

## 2025-01-31 ENCOUNTER — Encounter

## 2025-03-04 ENCOUNTER — Encounter

## 2025-04-08 ENCOUNTER — Ambulatory Visit

## 2025-04-08 ENCOUNTER — Encounter: Admitting: Nurse Practitioner
